# Patient Record
Sex: Male | Born: 1960 | Race: Black or African American | Hispanic: No | Marital: Single | Smoking: Never smoker
Health system: Southern US, Community
[De-identification: ages and names within clinical notes are randomized; demographics above are authoritative.]

## PROBLEM LIST (undated history)

## (undated) ENCOUNTER — Other Ambulatory Visit (HOSPITAL_COMMUNITY): Admission: EM | Payer: 59

## (undated) DIAGNOSIS — F419 Anxiety disorder, unspecified: Secondary | ICD-10-CM

## (undated) DIAGNOSIS — M170 Bilateral primary osteoarthritis of knee: Secondary | ICD-10-CM

## (undated) DIAGNOSIS — T8859XA Other complications of anesthesia, initial encounter: Secondary | ICD-10-CM

## (undated) DIAGNOSIS — Z8719 Personal history of other diseases of the digestive system: Secondary | ICD-10-CM

## (undated) DIAGNOSIS — G629 Polyneuropathy, unspecified: Secondary | ICD-10-CM

## (undated) DIAGNOSIS — G621 Alcoholic polyneuropathy: Secondary | ICD-10-CM

## (undated) DIAGNOSIS — J189 Pneumonia, unspecified organism: Secondary | ICD-10-CM

## (undated) DIAGNOSIS — K7010 Alcoholic hepatitis without ascites: Secondary | ICD-10-CM

## (undated) DIAGNOSIS — L219 Seborrheic dermatitis, unspecified: Secondary | ICD-10-CM

## (undated) DIAGNOSIS — K219 Gastro-esophageal reflux disease without esophagitis: Secondary | ICD-10-CM

## (undated) DIAGNOSIS — I10 Essential (primary) hypertension: Secondary | ICD-10-CM

## (undated) DIAGNOSIS — D860 Sarcoidosis of lung: Secondary | ICD-10-CM

## (undated) DIAGNOSIS — M24419 Recurrent dislocation, unspecified shoulder: Secondary | ICD-10-CM

## (undated) DIAGNOSIS — Z8673 Personal history of transient ischemic attack (TIA), and cerebral infarction without residual deficits: Secondary | ICD-10-CM

## (undated) DIAGNOSIS — G5601 Carpal tunnel syndrome, right upper limb: Secondary | ICD-10-CM

## (undated) DIAGNOSIS — I639 Cerebral infarction, unspecified: Secondary | ICD-10-CM

## (undated) DIAGNOSIS — S83249A Other tear of medial meniscus, current injury, unspecified knee, initial encounter: Secondary | ICD-10-CM

## (undated) DIAGNOSIS — E785 Hyperlipidemia, unspecified: Secondary | ICD-10-CM

## (undated) DIAGNOSIS — K760 Fatty (change of) liver, not elsewhere classified: Secondary | ICD-10-CM

## (undated) DIAGNOSIS — M4802 Spinal stenosis, cervical region: Secondary | ICD-10-CM

## (undated) DIAGNOSIS — Z789 Other specified health status: Secondary | ICD-10-CM

## (undated) DIAGNOSIS — F329 Major depressive disorder, single episode, unspecified: Secondary | ICD-10-CM

## (undated) DIAGNOSIS — K828 Other specified diseases of gallbladder: Secondary | ICD-10-CM

## (undated) DIAGNOSIS — F32A Depression, unspecified: Secondary | ICD-10-CM

## (undated) DIAGNOSIS — IMO0001 Reserved for inherently not codable concepts without codable children: Secondary | ICD-10-CM

## (undated) DIAGNOSIS — K859 Acute pancreatitis without necrosis or infection, unspecified: Secondary | ICD-10-CM

## (undated) DIAGNOSIS — F1010 Alcohol abuse, uncomplicated: Secondary | ICD-10-CM

## (undated) HISTORY — DX: Spinal stenosis, cervical region: M48.02

## (undated) HISTORY — PX: KNEE SURGERY: SHX244

## (undated) HISTORY — PX: KNEE ARTHROSCOPY: SUR90

## (undated) HISTORY — DX: Alcoholic polyneuropathy: G62.1

## (undated) HISTORY — DX: Essential (primary) hypertension: I10

## (undated) HISTORY — DX: Fatty (change of) liver, not elsewhere classified: K76.0

## (undated) HISTORY — DX: Depression, unspecified: F32.A

## (undated) HISTORY — DX: Hyperlipidemia, unspecified: E78.5

## (undated) HISTORY — DX: Personal history of other diseases of the digestive system: Z87.19

## (undated) HISTORY — DX: Pneumonia, unspecified organism: J18.9

## (undated) HISTORY — DX: Anxiety disorder, unspecified: F41.9

## (undated) HISTORY — PX: ELBOW SURGERY: SHX618

## (undated) HISTORY — DX: Other tear of medial meniscus, current injury, unspecified knee, initial encounter: S83.249A

## (undated) HISTORY — DX: Personal history of transient ischemic attack (TIA), and cerebral infarction without residual deficits: Z86.73

## (undated) HISTORY — DX: Other specified diseases of gallbladder: K82.8

## (undated) HISTORY — DX: Recurrent dislocation, unspecified shoulder: M24.419

## (undated) HISTORY — DX: Other specified health status: Z78.9

## (undated) HISTORY — DX: Alcoholic hepatitis without ascites: K70.10

## (undated) HISTORY — DX: Bilateral primary osteoarthritis of knee: M17.0

## (undated) HISTORY — DX: Seborrheic dermatitis, unspecified: L21.9

---

## 2006-07-09 ENCOUNTER — Emergency Department (HOSPITAL_COMMUNITY): Admission: EM | Admit: 2006-07-09 | Discharge: 2006-07-09 | Payer: Self-pay | Admitting: Emergency Medicine

## 2006-07-09 ENCOUNTER — Ambulatory Visit: Payer: Self-pay | Admitting: Psychiatry

## 2006-07-09 ENCOUNTER — Inpatient Hospital Stay (HOSPITAL_COMMUNITY): Admission: RE | Admit: 2006-07-09 | Discharge: 2006-07-14 | Payer: Self-pay | Admitting: Psychiatry

## 2006-07-11 ENCOUNTER — Ambulatory Visit: Payer: Self-pay | Admitting: Hospitalist

## 2007-02-04 ENCOUNTER — Ambulatory Visit: Payer: Self-pay | Admitting: Internal Medicine

## 2007-02-28 ENCOUNTER — Ambulatory Visit: Payer: Self-pay | Admitting: Internal Medicine

## 2008-02-01 ENCOUNTER — Emergency Department (HOSPITAL_COMMUNITY): Admission: EM | Admit: 2008-02-01 | Discharge: 2008-02-02 | Payer: Self-pay | Admitting: Emergency Medicine

## 2008-02-02 ENCOUNTER — Ambulatory Visit: Payer: Self-pay | Admitting: Psychiatry

## 2008-02-02 ENCOUNTER — Inpatient Hospital Stay (HOSPITAL_COMMUNITY): Admission: AD | Admit: 2008-02-02 | Discharge: 2008-02-05 | Payer: Self-pay | Admitting: Psychiatry

## 2008-02-19 ENCOUNTER — Emergency Department (HOSPITAL_COMMUNITY): Admission: EM | Admit: 2008-02-19 | Discharge: 2008-02-20 | Payer: Self-pay | Admitting: Emergency Medicine

## 2008-02-24 DIAGNOSIS — M129 Arthropathy, unspecified: Secondary | ICD-10-CM | POA: Insufficient documentation

## 2008-05-30 ENCOUNTER — Emergency Department (HOSPITAL_COMMUNITY): Admission: EM | Admit: 2008-05-30 | Discharge: 2008-05-30 | Payer: Self-pay | Admitting: Emergency Medicine

## 2011-04-11 NOTE — H&P (Signed)
NAMEMCKINNON, GLICK NO.:  [id number]   MEDICAL RECORD NO.:  [id number]          PATIENT TYPE:  IPS   LOCATION:  0507                          FACILITY:  [facility]   PHYSICIAN:  Geoffery Lyons, M.D.      DATE OF BIRTH:  [birth year]   DATE OF ADMISSION:  02/02/2008  DATE OF DISCHARGE:                       PSYCHIATRIC ADMISSION ASSESSMENT   IDENTIFYING INFORMATION/JUSTIFICATION FOR ADMISSION AND CARE:  This is a  50 year old separated African-American man who presented to the  emergency department at [facility], requesting DETOX from beer.  He states he usually drinks a 12 pack of 22 ounce beers on a daily  basis. His initial alcohol level was 308. It did come down to 193. His  UDS was negative. He has been with [place] in the past. The last admission  was in 2006, also for a DETOX.   He reports anxiety since age 75. He was started on Paxil about 3 years  ago. This made him develop suicidal ideation. He is very reluctant to  take medications for his anxiety. Today, he has elevated blood pressure  and I thought that maybe what we would do is initiate a Clonidine patch  to help with his blood pressure and his anxiety.   PAST PSYCHIATRIC HISTORY:  As already stated. He was with [place] back in  2006 and that is the only DETOX he acknowledges.   SOCIAL HISTORY:  He has had 1 year of college. He ha been married once.  He is currently separated. He has 2 sons, ages 45 and 55 and he is  employed by [employer].   FAMILY HISTORY:  Alcoholism runs in the family. His own alcohol and drug  history, he began using alcohol at age 37. He readily acknowledges  drinking at least a 6 pack a day for the past year. He states that  alcoholism has been an issue for him for about 15 years.   PRIMARY CARE PHYSICIAN:  He does not have a primary care physician yet  but he is anticipating getting health insurance. He does not have any  psychiatric provider.   PAST MEDICAL HISTORY:  He  was diagnosed with sarcoidosis a number of  years ago. He is not on any current medication.   MEDICATIONS:  As already stated in HPI. He reports he developed SI when  she was prescribed Paxil a few years ago.   ALLERGIES:  NO KNOWN DRUG ALLERGIES.   REVIEW OF SYSTEMS:  He was medically cleared in the emergency  department. His most remarkable finding was an enthusiastic alcohol  level of 308.   PHYSICAL EXAMINATION:  VITAL SIGNS:  He is 69 inches tall. Weight 141.  Temperature 97.4, blood pressure ranges from 130/89 to 145/86. Pulse 81  to 91 and respiratory rate is 20.   LABORATORY DATA:  He has not had a TSH checked. He does report a 7 pound  weight loss in the past year and he stays anxious, so we will just check  that out.   MENTAL STATUS EXAM:  He is alert and oriented. He is  casually dressed.  Appropriately groomed and nourished. Speech is normal rate, rhythm, and  tone. Mood is appropriate to the situation. Affect is a little anxious.  Thought processes are clear, rational, goal oriented. Wants to know if  anything can be done for his anxiety. Concentration and memory are  intact. Intelligence it at least average. He denies being suicidal or  homicidal. He denies auditory or visual hallucinations.   DIAGNOSES:  AXIS I:     Alcohol dependence, here for DETOX.  AXIS II:    Deferred.  AXIS III:   Sarcoidosis, hypertension.  AXIS IV:    Alcohol dependence.  AXIS V:     45.   PLAN:  Admit for DETOX. Toward that end, he was started on low-dose  Librium protocol. As he is afraid of medications, will try a Clonidine  patch to decrease his blood pressure and anxiety. He does have a slight  hand tremor. Will also check his TSH adn estimated length of stay is 4  to 5 days.      Mickie Leonarda Salon, P.A.-C.      Geoffery Lyons, M.D.  Electronically Signed    MD/MEDQ  D:  02/02/2008  T:  02/02/2008  Job:  [id number]

## 2011-04-14 NOTE — Discharge Summary (Signed)
[name], STRUPP NO.:  [id number]   MEDICAL RECORD NO.:  [id number]          PATIENT TYPE:  IPS   LOCATION:  0507                          FACILITY:  BH   PHYSICIAN:  Geoffery Lyons, M.D.      DATE OF BIRTH:  04/14/1961   DATE OF ADMISSION:  02/02/2008  DATE OF DISCHARGE:  02/05/2008                               DISCHARGE SUMMARY   CHIEF COMPLAINT/PRESENT ILLNESS:  This was the second admission to [name] for this 50 year old separated African American  male who presented to the emergency room requesting detox.  Usually  drinks 12-pack of 22 ounces beer on a daily basis.  Initial alcohol  level was 308.  UDS was negative.  Last admission 2006 reports anxiety  since age 89.  He was started on Paxil 3 years ago.  He claims suicidal  ideations after that.  Somewhat reluctant to take medications.   PAST PSYCHIATRIC HISTORY:  [name] 2006 for detox.   ALCOHOL/TOBACCO HISTORY:  Persistent use of alcohol, as of lately 12-  pack of 22 ounces beer on a daily basis.  No other substances.   MEDICAL HISTORY:  Sarcoidosis.   MEDICATIONS:  None.   PHYSICAL EXAMINATION:  Showed no acute findings.   LABORATORY WORK:  Alcohol level upon admission 308.   MENTAL STATUS EXAM:  Reveals alert, cooperative male, casually groomed  and dressed.  Normal in rate, rhythm and tone.  Mood was anxious.  Affect was anxious.  Thought processes were clear, rational and goal  oriented.  No delusions.  No active homicidal or suicidal ideas, no  hallucinations.  Cognition well-preserved.   AXIS I:  Alcohol dependence, anxiety disorder not otherwise specified.  AXIS II:  Sarcoidosis, hypertension.  AXIS IV: Moderate.  AXIS V:  On admission 35 GAF, in the last year 60.   COURSE IN THE HOSPITAL:  Was admitted, started individual and group  psychotherapy.  We detoxified with Librium, then he was given some  Elavil to begin with for sleep, placed on a  clonidine patch and started  on Lexapro.  As already stated, endorsed he was drinking too much, 6-8  every day for the last 6-7 months.  Family history of alcoholism.  Endorsed he suffers from chronic anxiety.  When younger, he had panic  attacks, social anxiety, used the beer to deal with the anxiety but  increased tolerance.  In [place], primary care prescribed Paxil and  endorsed suicidal ideations.  Endorsed constant worrying, thinking.  Panic attacks are better but still with generalized anxiety, decreased  sleep, separated from his wife.  She is in [place].  He lost his job,  financial difficulties, came here one and a half years ago temporarily.  Has two kids, 16 and 14.  By March 10, the detox was going well.  He was  going to like to pursue the Lexapro further.  Was wanting to be  considered for discharge.  He was committed to abstinence.  On March 11,  he was in full contact with reality.  There were  no active homicidal or  suicidal ideas, no hallucinations or delusions.  His mood was improve.  His affect was brighter.  Willing and motivated to pursue outpatient  treatment.   DISCHARGE DIAGNOSES:  AXIS I:  Alcohol dependence, anxiety disorder not  otherwise specified.  AXIS II: No diagnosis.  AXIS III:  Sarcoidosis, active hypertension.  AXIS IV: Moderate.  AXIS V:  60.   DISCHARGE MEDICATIONS:  Discharged on Lexapro 10 mg per day.  1. Catapres TTS 0.1 mg one patch every 7 days elevated to 25 at      bedtime.   FOLLOWUP:  Follow-up at [facility].      Geoffery Lyons, M.D.  Electronically Signed     IL/MEDQ  D:  03/04/2008  T:  03/05/2008  Job:  [id number]

## 2011-04-14 NOTE — Assessment & Plan Note (Signed)
[facility]                             PULMONARY OFFICE NOTE   [name], [name]                          MRN:          [id number]  DATE:02/28/2007                            DOB:          28-Dec-1960    HISTORY:  A 50 year old, black male seen on February 04, 2007 for  evaluation of an eight week history of cough that had not responded to  treatment directed at rhinitis in the setting of a ten year history of  sarcoid which had not required prednisone for several years but for  which he thought he might be recurrent.  He was requested to come back  today for followup and has taken a short course of prednisone that he is  finishing up but also been maintained on Prevacid and diet since that  time.  He states overall he is much better, with no significant coughing  or dyspnea.  He denies any ocular or articular changes, fever, chills,  sweats.   PHYSICAL EXAMINATION:  GENERAL:  He is a pleasant, ambulatory, black man  in no acute distress.  VITAL SIGNS:  He weighs 148 pounds.  HEENT:  Unremarkable.  Pharynx clear.  LUNGS:  Fields are clear bilaterally to auscultation and percussion with  no cough elicited on inspiratory or expiratory maneuvers nor any  wheezing.  HEART:  He has a regular rhythm without murmur, gallop, rub.  ABDOMEN:  Soft, benign.  EXTREMITIES:  Warm without calf tenderness, cyanosis, clubbing, or  edema.   He brought a recent chest x-ray with him that shows classic sarcoid  changes.  PFTs today are completely normal.   IMPRESSION:  No evidence of significant restrictive physiology or  definite indication for sarcoid.  The only indication, therefore, would  be a steroid responsive cough.  Since we treated him with both  Prevacid and prednisone, it is difficult to tell which one has helped  but he is definitely better.  At this point, he is finishing up the  predinsone today and I have asked him to continue the Prevacid for  another month.  If the cough exacerbates while on Prevacid but not on  prednisone, it would indicate an inflammatory but not necessarily  sarcoid etiology for the cough.  The differential diagnosis would  include allergic and nonspecific rhinitis, and asthma as well.  He is a  50 year old, black male, never smoker.   I explained this all to the patient including the results of his recent  chest x-ray and PFTs in detail.  I spent an extra 15 to 20 minutes in a  25 minute visit going over this plus the recommendation regarding  empiric diet restrictions for GERD (including the avoidance of mint and  menthol lozenges) and gave him samples that will last a month.  I will  see him at six weeks,  at which point he will be off the Prevacid for two weeks to see, by the  reverse of a therapeutic trial, at which point if either he flares in  terms of his symptoms.     [name].  Sherene Sires, MD, [facility]  Electronically Signed    MBW/MedQ  DD: 02/28/2007  DT: 02/28/2007  Job #: [id number]   cc:   Vikki Ports, M.D.

## 2011-04-14 NOTE — Assessment & Plan Note (Signed)
[facility]                             PULMONARY OFFICE NOTE   BUEFORD, ARP                          MRN:          [id number]  DATE:02/04/2007                            DOB:          1961-03-22    This is a pulmonary consultation requested by Dr. Theresia Lo.   REASON FOR CONSULTATION:  Cough.   HISTORY:  A 50 year old black male with history of sarcoid documented 10  years ago by lung biopsy and required prednisone for cough for several  years but has not been on it for over five years. He presents now with  about an eight-week history of dry cough (previous cough had been  slightly productive) that had not responded to treatment directed at  rhinitis. He has not been rechallenged with prednisone at this point. He  denies any ocular or articular complaints, skin complaints, significant  dyspnea, fevers, chills or unattended weight loss.   He denies any change in cough with variations in weather or environment  or any obvious alleviating factors.   PAST MEDICAL HISTORY:  Significant for hypertension and hyperlipidemia.   ALLERGIES:  None known.   MEDICATIONS:  Statin only, does not know the name.   SOCIAL HISTORY:  He never smoked. Occasional drinks but not in excess.  Works at [employer].   FAMILY HISTORY:  Negative for sarcoid or respiratory diseases.   REVIEW OF SYSTEMS:  Taken in detail on the worksheet. Negative except  for as outlined above.   PHYSICAL EXAMINATION:  This is a pleasant ambulatory black male in no  acute distress. He has stable vital signs.  HEENT:  Unremarkable. Oropharynx clear. Nasal turbinates normal.  NECK:  Supple without cervical adenopathy or tenderness. Trachea is  midline. No thyromegaly.  LUNGS:  Fields are completely clear bilaterally to auscultation and  percussion with no cough elicited on inspiratory or expiratory  maneuvers.  CARDIAC:  Regular rhythm without murmur, gallop or rub.  ABDOMEN:  Soft,  benign.  EXTREMITIES:  Warm without calf tenderness, clubbing, cyanosis, or  edema.   Hemo-saturation 97% on room air.   Chest x-ray from February 27 is typical of sarcoid.   Lab data from Dr. Aurelio Brash office reveals a normal protein to albumin  ratio, normal LFTs, thyroid functions.   IMPRESSION:  1. Long-standing sarcoid with previous cough that responded to      prednisone, but this was over five years ago. I suspect what we are      seeing now on chest x-ray is nothing more than parenchymal fibrotic      change related to previous sarcoid involvement, and he will not      likely have elevated sed rate or ACE level, and if not obtained by      Dr. Theresia Lo, we can do this on his next visit along with PFTs.  2. Much of coughing now is nonspecific in nature, more day than night      and dry in quality. I recommended a trial of Prevacid dosed 30 mg      before breakfast every day, a  6-day course of prednisone starting      at 40 mg and tapering off to see whether there is any short-term      benefit of prednisone or whether there is a long-term benefit of      Prevacid and advised him on diet only (the other possibility is      that he might have an asthmatic component to the cough, but note      the absence of nocturnal exacerbation makes this less likely).     Charlaine Dalton. Sherene Sires, MD, [facility]  Electronically Signed    MBW/MedQ  DD: 02/04/2007  DT: 02/05/2007  Job #: [id number]   cc:   Vikki Ports, M.D.

## 2011-04-14 NOTE — Discharge Summary (Signed)
NAMEBODEN, STUCKY NO.:  [id number]   MEDICAL RECORD NO.:  [id number]          PATIENT TYPE:  IPS   LOCATION:  0305                          FACILITY:  BH   PHYSICIAN:  Geoffery Lyons, M.D.      DATE OF BIRTH:  03-09-1961   DATE OF ADMISSION:  07/09/2006  DATE OF DISCHARGE:  07/14/2006                                 DISCHARGE SUMMARY   CHIEF COMPLAINT AND PRESENT ILLNESS:  This was the first admission to [facility] for this 50 year old separated African-American male  who presented to the emergency room requesting detox from alcohol, drinking  for a number of years, now escalating to 12+ beers daily.  Unable to quit  due to shaking.  Had some paresthesias in the ED attributed to withdrawal.  Endorsed lifelong history of anxiety with panic episodes, social anxieties.   PAST PSYCHIATRIC HISTORY:  First time at [facility].  Had been in  outpatient treatment in [place].   ALCOHOL/DRUG HISTORY:  Persistent use of alcohol as already stated.  No  other substances.   MEDICAL HISTORY:  Prostatitis, pulmonary sarcoidosis.   MEDICATIONS:  Prednisone 20 mg per day, Levaquin 500 mg daily.   PHYSICAL EXAMINATION:  Performed and failed to show any acute findings.   LABORATORY DATA:  Not available in the chart.   MENTAL STATUS EXAM:  Alert, anxious male.  Tremulous.  CIWA 12-15.  Speech  normal rate, tempo and production but tremulous tone.  Mood anxiety.  Affect  anxiety.  Thought processes logical, coherent and relevant.  Endorsed  feeling shaky.  Wanting to be detoxed.  Committed to abstinence.  No  delusions.  No hallucinations.  Cognition was well-preserved.   ADMISSION DIAGNOSES:  AXIS I:  Alcohol dependence.  Alcohol withdrawal.  Panic attack disorder.  AXIS II:  No diagnosis.  AXIS III:  Alcohol-induced hepatitis, thrombocytopenia, prostatitis,  pulmonary sarcoidosis.  AXIS IV:  Moderate.  AXIS V:  GAF upon admission 35; highest GAF  in the last year 66.   HOSPITAL COURSE:  He was admitted.  He was started in individual and group  psychotherapy.  He was detoxified with Librium.  He was maintained on his  prednisone and Levaquin.  He did endorse persistent drinking, more than 12  beers per day for years.  Endorsed long history of anxiety, panic since he  was very young.  He had been assessed for mixed symptoms of paresthesias,  weakness.  He claimed he was given Paxil once and he claimed that it made  him suicidal.  Endorsed that he is very worried about taking pills.  On  evaluation, he was very somatically focused.  He separated from his wife.  He has two children.  Came to [place], back to his parents' home.  Wife  stayed in [place].  Endorsed that they will probably get a divorce.  He was  maintained on medicine.  Initially, he was requesting to be discharged.  The  severity of his withdrawal was explained and he decided to stay.  We  continued to detox.  By July 06, 2006, endorsed that he was starting to  feel a little better.  Was able to sleep, still feeling groggy, lightheaded  but less anxious.  The detox continued to progress uneventfully.  He had a  bout with increased blood pressure but it was mostly anxiety-induced.  On  July 14, 2006, he was in full contact with reality.  There were no active  suicidal or homicidal ideation.  No hallucinations.  No delusions.  Willing  and motivated to pursue sobriety.  He was going to go to [facility].  He was also going to be following up with his primary care provider.   DISCHARGE DIAGNOSES:  AXIS I:  Alcohol dependence.  Alcohol withdrawal,  resolved.  Anxiety disorder not otherwise specified.  AXIS II:  No diagnosis.  AXIS III:  Thrombocytopenia and alcohol-induced hepatitis, resolving,  pulmonary sarcoidosis.  AXIS IV:  Moderate.  AXIS V:  GAF upon discharge 50.   DISCHARGE MEDICATIONS:  1. Levaquin 500 mg per day.  2. Prednisone 20 mg per  day.   FOLLOWUP:  [facility] and his primary care provider.      Geoffery Lyons, M.D.  Electronically Signed     IL/MEDQ  D:  07/31/2006  T:  07/31/2006  Job:  [id number]

## 2011-08-21 LAB — COMPREHENSIVE METABOLIC PANEL
ALT: 85 — ABNORMAL HIGH
AST: 93 — ABNORMAL HIGH
Albumin: 4.2
Alkaline Phosphatase: 38 — ABNORMAL LOW
BUN: 5 — ABNORMAL LOW
CO2: 28
Calcium: 9.1
Chloride: 101
Creatinine, Ser: 1.04
GFR calc Af Amer: >60
GFR calc non Af Amer: >60
Glucose, Bld: 95
Potassium: 3.8
Sodium: 139
Total Bilirubin: 0.8
Total Protein: 8.2

## 2011-08-21 LAB — CBC
HCT: 40.9
HCT: 42.2
Hemoglobin: 13.8
Hemoglobin: 14.2
MCHC: 33.7
MCHC: 33.7
MCV: 81.8
MCV: 81.6
Platelets: 227
Platelets: 265
RBC: 5
RBC: 5.17
RDW: 14.9
RDW: 14.7
WBC: 4.5
WBC: 5.5

## 2011-08-21 LAB — DIFFERENTIAL
Basophils Absolute: 0
Basophils Absolute: 0
Basophils Relative: 1
Basophils Relative: 1
Eosinophils Absolute: 0
Eosinophils Absolute: 0.1
Eosinophils Relative: 1
Eosinophils Relative: 2
Lymphocytes Relative: 21
Lymphocytes Relative: 32
Lymphs Abs: 1
Lymphs Abs: 1.8
Monocytes Absolute: 0.6
Monocytes Absolute: 0.7
Monocytes Relative: 13 — ABNORMAL HIGH
Monocytes Relative: 13 — ABNORMAL HIGH
Neutro Abs: 2.9
Neutro Abs: 2.9
Neutrophils Relative %: 64
Neutrophils Relative %: 52

## 2011-08-21 LAB — RAPID URINE DRUG SCREEN, HOSP PERFORMED
Amphetamines: NOT DETECTED
Amphetamines: NOT DETECTED
Barbiturates: NOT DETECTED
Barbiturates: NOT DETECTED
Benzodiazepines: NOT DETECTED
Benzodiazepines: POSITIVE — AB
Cocaine: NOT DETECTED
Cocaine: NOT DETECTED
Opiates: NOT DETECTED
Opiates: NOT DETECTED
Tetrahydrocannabinol: NOT DETECTED
Tetrahydrocannabinol: NOT DETECTED

## 2011-08-21 LAB — URINALYSIS, ROUTINE W REFLEX MICROSCOPIC
Bilirubin Urine: NEGATIVE
Glucose, UA: NEGATIVE
Hgb urine dipstick: NEGATIVE
Ketones, ur: NEGATIVE
Nitrite: NEGATIVE
Protein, ur: NEGATIVE
Specific Gravity, Urine: 1.005
Urobilinogen, UA: 0.2
pH: 6

## 2011-08-21 LAB — ETHANOL
Alcohol, Ethyl (B): 193 — ABNORMAL HIGH
Alcohol, Ethyl (B): 308 — ABNORMAL HIGH
Alcohol, Ethyl (B): 285 — ABNORMAL HIGH

## 2011-08-21 LAB — BASIC METABOLIC PANEL
BUN: 2 — ABNORMAL LOW
CO2: 29
Calcium: 9.1
Chloride: 103
Creatinine, Ser: 1.15
GFR calc Af Amer: >60
GFR calc non Af Amer: >60
Glucose, Bld: 107 — ABNORMAL HIGH
Potassium: 4.1
Sodium: 140

## 2011-08-21 LAB — TSH: TSH: 2.055

## 2011-08-24 LAB — DIFFERENTIAL
Basophils Absolute: 0
Basophils Relative: 0
Eosinophils Absolute: 0.1
Eosinophils Relative: 1
Lymphocytes Relative: 20
Lymphs Abs: 1.2
Monocytes Absolute: 0.7
Monocytes Relative: 12
Neutro Abs: 3.9
Neutrophils Relative %: 67

## 2011-08-24 LAB — BASIC METABOLIC PANEL
BUN: 9
CO2: 27
Calcium: 9.4
Chloride: 96
Creatinine, Ser: 0.94
GFR calc Af Amer: >60
GFR calc non Af Amer: >60
Glucose, Bld: 101 — ABNORMAL HIGH
Potassium: 3.9
Sodium: 134 — ABNORMAL LOW

## 2011-08-24 LAB — URINALYSIS, ROUTINE W REFLEX MICROSCOPIC
Bilirubin Urine: NEGATIVE
Glucose, UA: NEGATIVE
Hgb urine dipstick: NEGATIVE
Ketones, ur: NEGATIVE
Nitrite: NEGATIVE
Protein, ur: NEGATIVE
Specific Gravity, Urine: 1.01
Urobilinogen, UA: 1
pH: 6

## 2011-08-24 LAB — CBC
HCT: 43
Hemoglobin: 14.6
MCHC: 34
MCV: 79.4
Platelets: 187
RBC: 5.41
RDW: 15.3
WBC: 5.8

## 2012-07-01 ENCOUNTER — Encounter (HOSPITAL_BASED_OUTPATIENT_CLINIC_OR_DEPARTMENT_OTHER): Payer: Self-pay | Admitting: *Deleted

## 2012-07-01 ENCOUNTER — Emergency Department (HOSPITAL_BASED_OUTPATIENT_CLINIC_OR_DEPARTMENT_OTHER)
Admission: EM | Admit: 2012-07-01 | Discharge: 2012-07-01 | Disposition: A | Discharge status: Home / Self Care | Payer: Self-pay | Attending: Emergency Medicine | Admitting: Emergency Medicine

## 2012-07-01 DIAGNOSIS — F1020 Alcohol dependence, uncomplicated: Secondary | ICD-10-CM | POA: Insufficient documentation

## 2012-07-01 DIAGNOSIS — F341 Dysthymic disorder: Secondary | ICD-10-CM | POA: Insufficient documentation

## 2012-07-01 DIAGNOSIS — K219 Gastro-esophageal reflux disease without esophagitis: Secondary | ICD-10-CM | POA: Insufficient documentation

## 2012-07-01 HISTORY — DX: Gastro-esophageal reflux disease without esophagitis: K21.9

## 2012-07-01 HISTORY — DX: Anxiety disorder, unspecified: F41.9

## 2012-07-01 HISTORY — DX: Alcohol abuse, uncomplicated: F10.10

## 2012-07-01 HISTORY — DX: Sarcoidosis of lung: D86.0

## 2012-07-01 HISTORY — DX: Major depressive disorder, single episode, unspecified: F32.9

## 2012-07-01 HISTORY — DX: Reserved for inherently not codable concepts without codable children: IMO0001

## 2012-07-01 HISTORY — DX: Depression, unspecified: F32.A

## 2012-07-01 LAB — CBC WITH DIFFERENTIAL/PLATELET
Basophils Absolute: 0 10*3/uL (ref 0.0–0.1)
Basophils Relative: 0 % (ref 0–1)
Eosinophils Absolute: 0 10*3/uL (ref 0.0–0.7)
Eosinophils Relative: 1 % (ref 0–5)
HCT: 38.4 % — ABNORMAL LOW (ref 39.0–52.0)
Hemoglobin: 13 g/dL (ref 13.0–17.0)
Lymphocytes Relative: 30 % (ref 12–46)
Lymphs Abs: 1.4 10*3/uL (ref 0.7–4.0)
MCH: 27.8 pg (ref 26.0–34.0)
MCHC: 33.9 g/dL (ref 30.0–36.0)
MCV: 82.2 fL (ref 78.0–100.0)
Monocytes Absolute: 0.6 10*3/uL (ref 0.1–1.0)
Monocytes Relative: 12 % (ref 3–12)
Neutro Abs: 2.5 10*3/uL (ref 1.7–7.7)
Neutrophils Relative %: 56 % (ref 43–77)
Platelets: 166 10*3/uL (ref 150–400)
RBC: 4.67 MIL/uL (ref 4.22–5.81)
RDW: 16.4 % — ABNORMAL HIGH (ref 11.5–15.5)
WBC: 4.5 10*3/uL (ref 4.0–10.5)

## 2012-07-01 LAB — RAPID URINE DRUG SCREEN, HOSP PERFORMED
Amphetamines: NOT DETECTED
Barbiturates: NOT DETECTED
Benzodiazepines: NOT DETECTED
Cocaine: NOT DETECTED
Opiates: NOT DETECTED
Tetrahydrocannabinol: NOT DETECTED

## 2012-07-01 LAB — HEPATIC FUNCTION PANEL
ALT: 61 U/L — ABNORMAL HIGH (ref 0–53)
AST: 75 U/L — ABNORMAL HIGH (ref 0–37)
Albumin: 4.3 g/dL (ref 3.5–5.2)
Alkaline Phosphatase: 36 U/L — ABNORMAL LOW (ref 39–117)
Bilirubin, Direct: <0.1 mg/dL (ref 0.0–0.3)
Total Bilirubin: 0.2 mg/dL — ABNORMAL LOW (ref 0.3–1.2)
Total Protein: 8.9 g/dL — ABNORMAL HIGH (ref 6.0–8.3)

## 2012-07-01 LAB — POCT I-STAT, CHEM 8
BUN: 8 mg/dL (ref 6–23)
Calcium, Ion: 1.04 mmol/L — ABNORMAL LOW (ref 1.12–1.23)
Chloride: 101 mEq/L (ref 96–112)
Creatinine, Ser: 1.4 mg/dL — ABNORMAL HIGH (ref 0.50–1.35)
Glucose, Bld: 88 mg/dL (ref 70–99)
HCT: 44 % (ref 39.0–52.0)
Hemoglobin: 15 g/dL (ref 13.0–17.0)
Potassium: 3.9 mEq/L (ref 3.5–5.1)
Sodium: 139 mEq/L (ref 135–145)
TCO2: 26 mmol/L (ref 0–100)

## 2012-07-01 LAB — ETHANOL
Alcohol, Ethyl (B): 300 mg/dL — ABNORMAL HIGH (ref 0–11)
Alcohol, Ethyl (B): 228 mg/dL — ABNORMAL HIGH (ref 0–11)

## 2012-07-01 LAB — LIPASE, BLOOD: Lipase: 198 U/L — ABNORMAL HIGH (ref 11–59)

## 2012-07-01 MED ORDER — SODIUM CHLORIDE 0.9 % IV BOLUS (SEPSIS)
1000.0000 mL | Freq: Once | INTRAVENOUS | Status: AC
  Administered 2012-07-01: 1000 mL via INTRAVENOUS

## 2012-07-01 MED ORDER — LORAZEPAM 2 MG/ML IJ SOLN
INTRAMUSCULAR | Status: AC
  Administered 2012-07-01: 1 mg via INTRAVENOUS
  Filled 2012-07-01: qty 1

## 2012-07-01 MED ORDER — THIAMINE HCL 100 MG/ML IJ SOLN
Freq: Once | INTRAVENOUS | Status: DC
  Filled 2012-07-01: qty 1000

## 2012-07-01 MED ORDER — LORAZEPAM 1 MG PO TABS: 0.5000 mg | ORAL_TABLET | Freq: Three times a day (TID) | ORAL | Status: AC | PRN

## 2012-07-01 MED ORDER — LORAZEPAM 2 MG/ML IJ SOLN
1.0000 mg | Freq: Once | INTRAMUSCULAR | Status: AC
  Administered 2012-07-01: 1 mg via INTRAVENOUS

## 2012-07-01 NOTE — ED Notes (Signed)
Patient stated that he was sent here by [facility], attempted to contact [facility] in [facility], but no answer. Contacted [facility] and Mardelle Matte will call us shortly after 13:00 to discuss patient placement

## 2012-07-01 NOTE — ED Notes (Signed)
Mardelle Matte called from [facility] and stated she would be here at approx 14:00 to evaluate patient

## 2012-07-01 NOTE — ED Notes (Signed)
Mardelle Matte from [facility] explained to patient and family that he could not be admitted for rehab because his blood alcohol was greater than 200 and he would have to be discharged, could not drink tonight and come in tomorrow for repeat labs and if his alcohol level is lower their team would request a room for him. Family and patient agreed on plan. Patient agreed not to drink tonight

## 2012-07-01 NOTE — ED Notes (Signed)
Patient states he is here today for help with his alcohol abuse.  States he went to [facility] and was told to come here for medical clearance.  States he has had a long standing alcohol problem and over the last one year his drinking problem has increased.  States he drinks approximately 5- 24 oz cans of beer per day, last drink was approximately at 0800 today.  Denies any other substance abuse problems.

## 2012-07-01 NOTE — ED Provider Notes (Signed)
History     CSN: [id number]  Arrival date & time 07/01/12  1115   First MD Initiated Contact with Patient 07/01/12 1158      Chief Complaint  Patient presents with  . Medical Clearance    (Consider location/radiation/quality/duration/timing/severity/associated sxs/prior treatment) Patient is a 51 y.o. male presenting with alcohol problem. The history is provided by the patient.  Alcohol Problem This is a chronic problem. The problem has been unchanged. Associated symptoms include myalgias and numbness. Pertinent negatives include no chills or fever. Associated symptoms comments: The patient presents for help with detox from alcohol. He states he was seen at [facility] and sent here for medical clearance. (Cannot confirm with [facility] - no answer). He reports daily alcohol use since months ago when he last attempted to stop drinking. No history of alcohol withdrawal seizures. He denies current symptoms of nausea/vomiting, or shakes.He complains of generalized muscle aches and numbness in bilateral lower extremities and finger tips. Numbness has been chronic. No swelling..    Past Medical History  Diagnosis Date  . Alcohol abuse   . Sarcoidosis of lung   . Reflux   . Depression   . Anxiety     Past Surgical History  Procedure Date  . Knee arthroscopy     No family history on file.  History  Substance Use Topics  . Smoking status: Never Smoker   . Smokeless tobacco: Not on file  . Alcohol Use: Yes     4-5  24 oz cans of beer daily      Review of Systems  Constitutional: Negative for fever and chills.  HENT: Negative.   Respiratory: Negative.   Cardiovascular: Negative.   Gastrointestinal: Negative.   Musculoskeletal: Positive for myalgias.  Skin: Negative.   Neurological: Positive for numbness.  Psychiatric/Behavioral: Negative.     Allergies  Review of patient's allergies indicates no known allergies.  Home Medications  No current outpatient prescriptions on  file.  BP 138/88  Pulse 77  Temp 98.2 F (36.8 C) (Oral)  Resp 21  Ht 5\' 9"  (1.753 m)  Wt 130 lb (58.968 kg)  BMI 19.20 kg/m2  SpO2 98%  Physical Exam  Constitutional: He is oriented to person, place, and time. He appears well-developed and well-nourished.  HENT:  Head: Normocephalic.  Neck: Normal range of motion. Neck supple.  Cardiovascular: Normal rate and regular rhythm.   Pulmonary/Chest: Effort normal and breath sounds normal.  Abdominal: Soft. Bowel sounds are normal. There is no tenderness. There is no rebound and no guarding.  Musculoskeletal: Normal range of motion.  Neurological: He is alert and oriented to person, place, and time.       The patient is mildly tremulous. No asterixis. Slightly decreased sensation to light touch in bilateral lower extremities. No weakness. Normal reflexes.   Skin: Skin is warm and dry. No rash noted.  Psychiatric: He has a normal mood and affect.    ED Course  Procedures (including critical care time)  Labs Reviewed  CBC WITH DIFFERENTIAL - Abnormal; Notable for the following:    HCT 38.4 (*)     RDW 16.4 (*)     All other components within normal limits  POCT I-STAT, CHEM 8 - Abnormal; Notable for the following:    Creatinine, Ser 1.40 (*)     Calcium, Ion 1.04 (*)     All other components within normal limits  URINE RAPID DRUG SCREEN (HOSP PERFORMED)  LIPASE, BLOOD  HEPATIC FUNCTION PANEL   No  results found. Results for orders placed during the hospital encounter of 07/01/12  URINE RAPID DRUG SCREEN (HOSP PERFORMED)      Component Value Range   Opiates NONE DETECTED  NONE DETECTED   Cocaine NONE DETECTED  NONE DETECTED   Benzodiazepines NONE DETECTED  NONE DETECTED   Amphetamines NONE DETECTED  NONE DETECTED   Tetrahydrocannabinol NONE DETECTED  NONE DETECTED   Barbiturates NONE DETECTED  NONE DETECTED  CBC WITH DIFFERENTIAL      Component Value Range   WBC 4.5  4.0 - 10.5 K/uL   RBC 4.67  4.22 - 5.81 MIL/uL    Hemoglobin 13.0  13.0 - 17.0 g/dL   HCT 16.1 (*) 09.6 - 04.5 %   MCV 82.2  78.0 - 100.0 fL   MCH 27.8  26.0 - 34.0 pg   MCHC 33.9  30.0 - 36.0 g/dL   RDW 40.9 (*) 81.1 - 91.4 %   Platelets 166  150 - 400 K/uL   Neutrophils Relative 56  43 - 77 %   Neutro Abs 2.5  1.7 - 7.7 K/uL   Lymphocytes Relative 30  12 - 46 %   Lymphs Abs 1.4  0.7 - 4.0 K/uL   Monocytes Relative 12  3 - 12 %   Monocytes Absolute 0.6  0.1 - 1.0 K/uL   Eosinophils Relative 1  0 - 5 %   Eosinophils Absolute 0.0  0.0 - 0.7 K/uL   Basophils Relative 0  0 - 1 %   Basophils Absolute 0.0  0.0 - 0.1 K/uL  LIPASE, BLOOD      Component Value Range   Lipase 198 (*) 11 - 59 U/L  HEPATIC FUNCTION PANEL      Component Value Range   Total Protein 8.9 (*) 6.0 - 8.3 g/dL   Albumin 4.3  3.5 - 5.2 g/dL   AST 75 (*) 0 - 37 U/L   ALT 61 (*) 0 - 53 U/L   Alkaline Phosphatase 36 (*) 39 - 117 U/L   Total Bilirubin 0.2 (*) 0.3 - 1.2 mg/dL   Bilirubin, Direct <7.8  0.0 - 0.3 mg/dL   Indirect Bilirubin NOT CALCULATED  0.3 - 0.9 mg/dL  POCT I-STAT, CHEM 8      Component Value Range   Sodium 139  135 - 145 mEq/L   Potassium 3.9  3.5 - 5.1 mEq/L   Chloride 101  96 - 112 mEq/L   BUN 8  6 - 23 mg/dL   Creatinine, Ser 2.95 (*) 0.50 - 1.35 mg/dL   Glucose, Bld 88  70 - 99 mg/dL   Calcium, Ion 6.21 (*) 1.12 - 1.23 mmol/L   TCO2 26  0 - 100 mmol/L   Hemoglobin 15.0  13.0 - 17.0 g/dL   HCT 30.8  65.7 - 84.6 %     No diagnosis found.  1. Alcohol dependence  MDM  Waiting on [facility] to assist with placement.  Unable to be accepted at [facility] due to high alcohol level. Patient to go home and return tomorrow after contacting [facility] crisis for clearance and placement.  Patient is not suicidal or homicidal. Stable for discharge.   Rodena Medin, PA-C 07/01/12 1900

## 2012-07-02 ENCOUNTER — Emergency Department (HOSPITAL_BASED_OUTPATIENT_CLINIC_OR_DEPARTMENT_OTHER)
Admission: EM | Admit: 2012-07-02 | Discharge: 2012-07-02 | Disposition: A | Discharge status: Home / Self Care | Payer: Self-pay | Attending: Emergency Medicine | Admitting: Emergency Medicine

## 2012-07-02 DIAGNOSIS — D869 Sarcoidosis, unspecified: Secondary | ICD-10-CM | POA: Insufficient documentation

## 2012-07-02 DIAGNOSIS — F1020 Alcohol dependence, uncomplicated: Secondary | ICD-10-CM | POA: Insufficient documentation

## 2012-07-02 DIAGNOSIS — K219 Gastro-esophageal reflux disease without esophagitis: Secondary | ICD-10-CM | POA: Insufficient documentation

## 2012-07-02 DIAGNOSIS — F10939 Alcohol use, unspecified with withdrawal, unspecified: Secondary | ICD-10-CM | POA: Insufficient documentation

## 2012-07-02 DIAGNOSIS — F10239 Alcohol dependence with withdrawal, unspecified: Secondary | ICD-10-CM | POA: Insufficient documentation

## 2012-07-02 LAB — ETHANOL: Alcohol, Ethyl (B): <11 mg/dL (ref 0–11)

## 2012-07-02 LAB — RAPID URINE DRUG SCREEN, HOSP PERFORMED
Amphetamines: NOT DETECTED
Barbiturates: NOT DETECTED
Benzodiazepines: NOT DETECTED
Cocaine: NOT DETECTED
Opiates: NOT DETECTED
Tetrahydrocannabinol: NOT DETECTED

## 2012-07-02 LAB — LIPASE, BLOOD: Lipase: 179 U/L — ABNORMAL HIGH (ref 11–59)

## 2012-07-02 MED ORDER — THIAMINE HCL 100 MG/ML IJ SOLN: 100.0000 mg | Freq: Every day | INTRAMUSCULAR | Status: DC

## 2012-07-02 MED ORDER — ADULT MULTIVITAMIN W/MINERALS CH
ORAL_TABLET | ORAL | Status: AC
  Filled 2012-07-02: qty 1

## 2012-07-02 MED ORDER — FOLIC ACID 1 MG PO TABS
1.0000 mg | ORAL_TABLET | Freq: Every day | ORAL | Status: DC
  Administered 2012-07-02: 1 mg via ORAL

## 2012-07-02 MED ORDER — ADULT MULTIVITAMIN W/MINERALS CH: 1.0000 | ORAL_TABLET | Freq: Every day | ORAL | Status: DC

## 2012-07-02 MED ORDER — VITAMIN B-1 100 MG PO TABS
100.0000 mg | ORAL_TABLET | Freq: Every day | ORAL | Status: DC
  Administered 2012-07-02 (×2): 100 mg via ORAL

## 2012-07-02 MED ORDER — LORAZEPAM 2 MG/ML IJ SOLN: 1.0000 mg | Freq: Four times a day (QID) | INTRAMUSCULAR | Status: DC | PRN

## 2012-07-02 MED ORDER — LORAZEPAM 1 MG PO TABS
1.0000 mg | ORAL_TABLET | Freq: Four times a day (QID) | ORAL | Status: DC | PRN
  Administered 2012-07-02 (×2): 1 mg via ORAL
  Filled 2012-07-02 (×2): qty 1

## 2012-07-02 MED ORDER — VITAMIN B-1 100 MG PO TABS
ORAL_TABLET | ORAL | Status: AC
  Administered 2012-07-02: 100 mg via ORAL
  Filled 2012-07-02: qty 1

## 2012-07-02 MED ORDER — LORAZEPAM 1 MG PO TABS: 1.0000 mg | ORAL_TABLET | Freq: Three times a day (TID) | ORAL | Status: AC | PRN

## 2012-07-02 MED ORDER — FOLIC ACID 1 MG PO TABS
ORAL_TABLET | ORAL | Status: AC
  Filled 2012-07-02: qty 1

## 2012-07-02 NOTE — ED Provider Notes (Addendum)
History     CSN: [id number]  Arrival date & time 07/02/12  0717   First MD Initiated Contact with Patient 07/02/12 [phone]      Chief Complaint  Patient presents with  . Medical Clearance    (Consider location/radiation/quality/duration/timing/severity/associated sxs/prior treatment) HPI Comments: Patient presents requesting alcohol detox. He was seen here yesterday for the same thing but his alcohol level was too high so he was sent home and was told not to drink tonight and advised to come back today to have her repeat alcohol level and to attempt placement into a rehabilitation facility. He states that he currently drinks about 4 24 ounce beers daily. This has been going on for 6 or 7 months. He has been in rehabilitation several times in the past. Denies any drug use. Denies any suicidal ideations. He has some intermittent pains in his epigastric area but denies any pain currently. He has some mild nausea but no vomiting. His last reading was at 10 PM last night. He states he is feeling shaky currently but denies any hallucinations  The history is provided by the patient.    Past Medical History  Diagnosis Date  . Alcohol abuse   . Sarcoidosis of lung   . Reflux   . Depression   . Anxiety     Past Surgical History  Procedure Date  . Knee arthroscopy     No family history on file.  History  Substance Use Topics  . Smoking status: Never Smoker   . Smokeless tobacco: Not on file  . Alcohol Use: Yes     4-5  24 oz cans of beer daily      Review of Systems  Constitutional: Negative for fever, chills, diaphoresis and fatigue.  HENT: Negative for congestion, rhinorrhea and sneezing.   Eyes: Negative.   Respiratory: Negative for cough, chest tightness and shortness of breath.   Cardiovascular: Negative for chest pain and leg swelling.  Gastrointestinal: Positive for nausea. Negative for vomiting, abdominal pain and diarrhea.  Genitourinary: Negative for frequency,  hematuria, flank pain and difficulty urinating.  Musculoskeletal: Negative for back pain and arthralgias.  Skin: Negative for rash.  Neurological: Positive for tremors. Negative for dizziness, speech difficulty, weakness, numbness and headaches.  Psychiatric/Behavioral: Negative for suicidal ideas and confusion. The patient is nervous/anxious.     Allergies  Review of patient's allergies indicates no known allergies.  Home Medications   Current Outpatient Rx  Name Route Sig Dispense Refill  . LORAZEPAM 1 MG PO TABS Oral Take 0.5 tablets (0.5 mg total) by mouth 3 (three) times daily as needed for anxiety. 3 tablet 0    BP 151/95  Pulse 77  Temp 98.2 F (36.8 C)  Resp 20  SpO2 98%  Physical Exam  Constitutional: He is oriented to person, place, and time. He appears well-developed and well-nourished.       Mildly anxious with slight tremor  HENT:  Head: Normocephalic and atraumatic.  Eyes: Pupils are equal, round, and reactive to light.  Neck: Normal range of motion. Neck supple.  Cardiovascular: Normal rate, regular rhythm and normal heart sounds.   Pulmonary/Chest: Effort normal and breath sounds normal. No respiratory distress. He has no wheezes. He has no rales. He exhibits no tenderness.  Abdominal: Soft. Bowel sounds are normal. There is no tenderness. There is no rebound and no guarding.  Musculoskeletal: Normal range of motion. He exhibits no edema.  Lymphadenopathy:    He has no cervical adenopathy.  Neurological:  He is alert and oriented to person, place, and time.  Skin: Skin is warm and dry. No rash noted.  Psychiatric: He has a normal mood and affect.    ED Course  Procedures (including critical care time)  Results for orders placed during the hospital encounter of 07/02/12  ETHANOL      Component Value Range   Alcohol, Ethyl (B) <11  0 - 11 mg/dL  LIPASE, BLOOD      Component Value Range   Lipase 179 (*) 11 - 59 U/L  URINE RAPID DRUG SCREEN (HOSP  PERFORMED)      Component Value Range   Opiates NONE DETECTED  NONE DETECTED   Cocaine NONE DETECTED  NONE DETECTED   Benzodiazepines NONE DETECTED  NONE DETECTED   Amphetamines NONE DETECTED  NONE DETECTED   Tetrahydrocannabinol NONE DETECTED  NONE DETECTED   Barbiturates NONE DETECTED  NONE DETECTED   No results found.    1. Alcohol withdrawal       MDM  Pt with alcohol withdrawal symptoms.  No evidence of DTs.  Will start CIWA protocol, recheck ETOH level and lipase which was mildly elevated yesterday and notify [facility]   1142 patient has been accepted to [facility]. Will be driven there by a family member     Rolan Bucco, MD 07/02/12 7829  Rolan Bucco, MD 07/02/12 [phone]

## 2012-07-02 NOTE — ED Notes (Signed)
[facility] Jan here

## 2012-07-02 NOTE — ED Notes (Signed)
[facility] called

## 2012-07-02 NOTE — ED Provider Notes (Signed)
Medical screening examination/treatment/procedure(s) were performed by non-physician practitioner and as supervising physician I was immediately available for consultation/collaboration.   Gerhard Munch, MD 07/02/12 [phone]

## 2012-07-02 NOTE — ED Notes (Signed)
Jan mobile crisis will be here within - 1 hr

## 2012-07-02 NOTE — ED Notes (Signed)
Pt relaxed sitting on stretcher. Awaiting ok for [facility] tx. Denies nausea or vomiting denies pain

## 2012-07-02 NOTE — ED Notes (Signed)
Here for medical clearance.

## 2013-07-10 ENCOUNTER — Ambulatory Visit (INDEPENDENT_AMBULATORY_CARE_PROVIDER_SITE_OTHER): Payer: BC Managed Care – PPO | Admitting: Family Medicine

## 2013-07-10 ENCOUNTER — Encounter: Payer: Self-pay | Admitting: Family Medicine

## 2013-07-10 VITALS — BP 132/90 | Temp 98.4°F | Ht 69.25 in | Wt 140.0 lb

## 2013-07-10 DIAGNOSIS — G5601 Carpal tunnel syndrome, right upper limb: Secondary | ICD-10-CM

## 2013-07-10 DIAGNOSIS — F1010 Alcohol abuse, uncomplicated: Secondary | ICD-10-CM

## 2013-07-10 DIAGNOSIS — M25531 Pain in right wrist: Secondary | ICD-10-CM

## 2013-07-10 DIAGNOSIS — Z7189 Other specified counseling: Secondary | ICD-10-CM

## 2013-07-10 DIAGNOSIS — F99 Mental disorder, not otherwise specified: Secondary | ICD-10-CM

## 2013-07-10 DIAGNOSIS — F489 Nonpsychotic mental disorder, unspecified: Secondary | ICD-10-CM

## 2013-07-10 DIAGNOSIS — M25539 Pain in unspecified wrist: Secondary | ICD-10-CM

## 2013-07-10 DIAGNOSIS — Z7689 Persons encountering health services in other specified circumstances: Secondary | ICD-10-CM

## 2013-07-10 DIAGNOSIS — M549 Dorsalgia, unspecified: Secondary | ICD-10-CM

## 2013-07-10 DIAGNOSIS — G5600 Carpal tunnel syndrome, unspecified upper limb: Secondary | ICD-10-CM

## 2013-07-10 NOTE — Patient Instructions (Addendum)
-  We have ordered labs or studies at this visit. It can take up to 1-2 weeks for results and processing. We will contact you with instructions IF your results are abnormal. Normal results will be released to your [facility]. If you have not heard from [place] or can not find your results in [facility] in 2 weeks please contact our office.  -PLEASE SIGN UP FOR MYCHART TODAY   We recommend the following healthy lifestyle measures: - eat a healthy diet consisting of lots of vegetables, fruits, beans, nuts, seeds, healthy meats such as white chicken and fish and whole grains.  - avoid fried foods, fast food, processed foods, sodas, red meet and other fattening foods.  - get a least 150 minutes of aerobic exercise per week.   I think your wrist pain is likely carpal tunnel and tendon related and I would advise wearing a cock up brace at night in the meantime until you see the orthopedic doctor.   Follow up with the specialist for your pain  Follow up in one month with [place] for a physical exam

## 2013-07-10 NOTE — Progress Notes (Addendum)
Chief Complaint  Patient presents with  . Establish Care  . bilateral hand spasms  . back strain    HPI:  [name] [name] is here to establish care. Recently moved back here from [place].  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, now doing more stuffing and cushion -lots of pulling and lifting -R hand dominant and has more pain in R hand, notices more with work and at night, R thenar and wrist pain, some tingling in thumb and first finger and feels like some weakness in this thumb too -he wants a referral to an orthopedic doctor -he is asking for relief from heavy lifting until sees orthopedics -occ muscle pain in other hand as well at work  Back Pain:  -started today -shoulder and thoracic areas -muscle pain  Sarcoidosis: -reports was diagnosed 20 years ago and was on steroids shortly and reports saw a pulmonologist a few times since for this and was fine and has not had any symptoms or issues with this or treatment in many years -denies: cough, SOB, DOE, fevers  Alcoholism/Anxiety: -major issues in the past/on trileptal and trazadone in the past but reports he is fine now -had gone through rehab and was doing well but now having relapse for last 3 months but report he thinks he is doing well -drinks a minimum of 3 beers daily and more then 7 per day on weekends "who knows" when asked how many on weekends -reports [phone] psych services there as well - has appointment scheduled - considering rehab treatment for alcoholism  There are no active problems to display for this patient.  Health Maintenance:  ROS: See pertinent positives and negatives per HPI.  Past Medical History  Diagnosis Date  . Alcohol abuse   . Sarcoidosis of lung   . Reflux   .  Depression   . Anxiety     Family History  Problem Relation Age of Onset  . Diabetes Father   . Hypertension Father   . Hypertension Paternal Grandfather     History   Social History  . Marital Status: Legally Separated    Spouse Name: N/A    Number of Children: N/A  . Years of Education: N/A   Social History Main Topics  . Smoking status: Never Smoker   . Smokeless tobacco: None  . Alcohol Use: Yes     Comment: 4-5  24 oz cans of beer daily; updated on 07/10/13 about 3 cans of beer after work  . Drug Use: No  . Sexual Activity: None   Other Topics Concern  . None   Social History Narrative  . None    No current outpatient prescriptions on file.  EXAM:  Filed Vitals:   07/10/13 1558  BP: 132/90  Temp: 98.4 F (36.9 C)    Body mass index is 20.52 kg/(m^2).  GENERAL: vitals reviewed and listed above, alert, oriented, appears well hydrated and in no acute distress  HEENT: atraumatic, conjunttiva clear, no obvious abnormalities on inspection of external nose and ears  NECK: no obvious masses on inspection  LUNGS: clear to auscultation bilaterally, no wheezes, rales or rhonchi, good air movement  CV: HRRR, no peripheral edema  MS: moves all extremities without noticeable abnormality R wrist TTP in thenar eminence area, +  phalens/tinels, no weakness in grip strength or thumb motions or wrist or forearm muscles, normal sensation and strength in upper extremities bilaterally. Neg spurlings. No winging scapula. No bony TTP in cervical or thoracic region. Milld TTP rhomboids bilat.  PSYCH: anxious and somewhat argumentative during visit.  ASSESSMENT AND PLAN:  Discussed the following assessment and plan:  Wrist pain, acute, right - Plan: Ambulatory referral to Orthopedic Surgery - I suspect CTS and made recommendations for this but he became angry and raised his voice when I suggested a cock up brace and when I asked why he was upset he said I was trying to say  he isn't in pain and has no weakness. I told him I did not perceive any weakness on exam, but that I believe he has pain. He stated theat my "little exam" can't tell about weakness when he lifts 300lbs. He wants to see orthopedic specialist for this. - referral placed, cockup brace at night advised in meantime - applied and fitted -he wanted letter saying he was referred to ortho - provided -will defer to ortho for any work restriction recomendaions  Carpal tunnel syndrome, right - Plan: Ambulatory referral to Orthopedic Surgery -see above  Encounter to establish care  Back pain - mild, started today -seems to be simply muscle strain, mild  -advised proper lifting mechanics -advised against strong pain medications given alcohol use  Alcohol abuse/Psychiatric disorder: -he denies serious psych disorder or hospitalization for psych issues in the past -his speech and mood fluctuated during visit -he accused me of trying to send records of his alcohol use to his work - I assured him I was doing no such thing -he reported he didn't want "any of that on his record" -I notified him the diagnosis of alcohol use/abuse, past psych medications, visits to ED and psych history were already in his medical record, but that I had no intention of sending that to his work at this point -he reports he is seeing Dr. Neva Seat and [facility] for his [facility] and alcohol issues -advised him to cut back on alcohol use and seek help - which he insists he has already  -We reviewed the PMH, PSH, FH, SH, Meds and Allergies. -We provided refills for any medications we will prescribe as needed. -We addressed current concerns per orders and patient instructions. -We have asked for records for pertinent exams, studies, vaccines and notes from previous providers. -discussed given no medical care in some time - physical and labs advise, he prefers to return for this ->45 minutes spent face to face with  this patient  -Patient advised to return or notify a doctor immediately if symptoms worsen or persist or new concerns arise.  Patient Instructions  -We have ordered labs or studies at this visit. It can take up to 1-2 weeks for results and processing. We will contact you with instructions IF your results are abnormal. Normal results will be released to your [facility]. If you have not heard from [place] or can not find your results in [facility] in 2 weeks please contact our office.  -PLEASE SIGN UP FOR MYCHART TODAY   We recommend the following healthy lifestyle measures: - eat a healthy diet consisting of lots of vegetables, fruits, beans, nuts, seeds, healthy meats such as white chicken and fish and whole grains.  - avoid fried foods, fast food, processed foods, sodas, red meet and other fattening foods.  - get a least 150 minutes of aerobic exercise per  week.   I think your wrist pain is likely carpal tunnel and tendon related and I would advise wearing a cock up brace at night in the meantime until you see the orthopedic doctor.   Follow up with the specialist for your pain  Follow up in one month with [place] for a physical exam     KIM, HANNAH R.

## 2013-07-23 ENCOUNTER — Telehealth: Payer: Self-pay | Admitting: Family Medicine

## 2013-07-23 NOTE — Telephone Encounter (Signed)
Patient Information:  Caller Name: [place]  Phone: [phone]  Patient: [name] [name], [name] [name]  Gender: Male  DOB: Feb 03, 1961  Age: 52 Years  PCP: Kriste Basque [facility])  Office Follow Up:  Does the office need to follow up with this patient?: No  Instructions For The Office: N/A   Symptoms  Reason For Call & Symptoms: Calling about R ear blocked for past 4 days. No pain in ear. Afebrile. He has some sinus pressure and occasional sneezing with some greenish nasal drainage. He has had sinus issues in the past.   Reviewed Health History In EMR: Yes  Reviewed Medications In EMR: Yes  Reviewed Allergies In EMR: Yes  Reviewed Surgeries / Procedures: Yes  Date of Onset of Symptoms: 07/19/2013  Treatments Tried: none   Guideline(s) Used:  Sinus Pain and Congestion  Ear - Congestion  Disposition Per Guideline:   See Within 3 Days in Office  Reason For Disposition Reached:   Ear congestion present > 48 hours  Advice Given:  Reassurance:   Sinus congestion is a normal part of a cold.  Usually home treatment with nasal washes can prevent an actual bacterial sinus infection.  For a Stuffy Nose - Use Nasal Washes:  Introduction: Saline (salt water) nasal irrigation (nasal wash) is an effective and simple home remedy for treating stuffy nose and sinus congestion. The nose can be irrigated by pouring, spraying, or squirting salt water into the nose and then letting it run back out.  Nasal Decongestants for a Very Stuffy or Runny Nose:  Pseudoephedrine (Sudafed) is available OTC in pill form. Typical adult dosage is two 30 mg tablets every 6 hours.  Hydration:  Drink plenty of liquids (6-8 glasses of water daily). If the air in your home is dry, use a cool mist humidifier  Expected Course:  Sinus congestion from viral upper respiratory infections (colds) usually lasts 5-10 days.  Occasionally a cold can worsen and turn into bacterial sinusitis. Clues to this are sinus symptoms lasting  longer than 10 days, fever lasting longer than 3 days, and worsening pain. Bacterial sinusitis may need antibiotic treatment.  Call Back If:   Severe pain lasts longer than 2 hours after pain medicine  Sinus pain lasts longer than 1 day after starting treatment using nasal washes  Sinus congestion (fullness) lasts longer than 10 days  Fever lasts longer than 3 days  You become worse.  Reassurance:  Definition: Ear congestion is the medical term used to describe symptoms of a stuffy, full, or plugged sensation in the ear. People also sometimes describe crackling or popping noises in the ear. Sometimes hearing seems slightly muffled.  Eustacian tube: There is a small collapsible tube that runs between the middle ear and the nose. Normally, it permits tiny amounts of air to move in and out of the middle ear. When the tube gets blocked, air or fluid can build up behind the ear drum (tympanic membrane). This causes the symptoms of ear congestion.  Here is some care advice that should help.  Causes  Upper respiratory infections (colds) and nasal allergies (hay fever) can block the eustachian tube.  Blowing the nose too hard can also push air and fluid into the eustachian tube.  Treatment - Decongestant Nasal Spray:  If chewing or swallowing doesn't help after 1 or 2 hours, you can try using an over-the-counter (OTC) nasal decongestant drops. You can use the nose drops twice a day.  Caution - Nasal Decongestants:  Do not  use these medications for more than 3 days (Reason: rebound nasal congestion).  Expected Course:   The symptoms usually get better within 2 days (48 hours) with treatment.  It is safe to swim.  Call Back If:   Ear congestion lasts over 48 hours  Ear pain or fever occurs  You become worse.  Patient Refused Recommendation:  Patient Will Follow Up With Office Later  Wants to try home care to treat congestion and will call back if not helping.

## 2013-08-01 ENCOUNTER — Encounter: Payer: BC Managed Care – PPO | Admitting: Family Medicine

## 2013-08-01 NOTE — Progress Notes (Signed)
Late cancel  This encounter was created in error - please disregard. 

## 2013-09-01 ENCOUNTER — Emergency Department (HOSPITAL_BASED_OUTPATIENT_CLINIC_OR_DEPARTMENT_OTHER)
Admission: EM | Admit: 2013-09-01 | Discharge: 2013-09-01 | Disposition: A | Discharge status: Other Acute Inpatient Hospital | Payer: BC Managed Care – PPO | Attending: Emergency Medicine | Admitting: Emergency Medicine

## 2013-09-01 ENCOUNTER — Encounter (HOSPITAL_BASED_OUTPATIENT_CLINIC_OR_DEPARTMENT_OTHER): Payer: Self-pay | Admitting: Emergency Medicine

## 2013-09-01 ENCOUNTER — Encounter (HOSPITAL_COMMUNITY): Payer: Self-pay | Admitting: *Deleted

## 2013-09-01 ENCOUNTER — Inpatient Hospital Stay (HOSPITAL_COMMUNITY)
Admission: AD | Admit: 2013-09-01 | Discharge: 2013-09-05 | DRG: 751 | Disposition: A | Discharge status: Home / Self Care | Payer: BC Managed Care – PPO | Source: Intra-hospital | Attending: Psychiatry | Admitting: Psychiatry

## 2013-09-01 ENCOUNTER — Emergency Department (HOSPITAL_BASED_OUTPATIENT_CLINIC_OR_DEPARTMENT_OTHER): Payer: BC Managed Care – PPO

## 2013-09-01 DIAGNOSIS — F410 Panic disorder [episodic paroxysmal anxiety] without agoraphobia: Secondary | ICD-10-CM | POA: Diagnosis present

## 2013-09-01 DIAGNOSIS — F10939 Alcohol use, unspecified with withdrawal, unspecified: Principal | ICD-10-CM

## 2013-09-01 DIAGNOSIS — Z8619 Personal history of other infectious and parasitic diseases: Secondary | ICD-10-CM | POA: Insufficient documentation

## 2013-09-01 DIAGNOSIS — K861 Other chronic pancreatitis: Secondary | ICD-10-CM

## 2013-09-01 DIAGNOSIS — F1020 Alcohol dependence, uncomplicated: Secondary | ICD-10-CM

## 2013-09-01 DIAGNOSIS — Z8659 Personal history of other mental and behavioral disorders: Secondary | ICD-10-CM | POA: Insufficient documentation

## 2013-09-01 DIAGNOSIS — Z8669 Personal history of other diseases of the nervous system and sense organs: Secondary | ICD-10-CM | POA: Insufficient documentation

## 2013-09-01 DIAGNOSIS — F411 Generalized anxiety disorder: Secondary | ICD-10-CM

## 2013-09-01 DIAGNOSIS — F1010 Alcohol abuse, uncomplicated: Secondary | ICD-10-CM | POA: Insufficient documentation

## 2013-09-01 DIAGNOSIS — Z79899 Other long term (current) drug therapy: Secondary | ICD-10-CM

## 2013-09-01 DIAGNOSIS — F10239 Alcohol dependence with withdrawal, unspecified: Principal | ICD-10-CM

## 2013-09-01 HISTORY — DX: Acute pancreatitis without necrosis or infection, unspecified: K85.90

## 2013-09-01 HISTORY — DX: Carpal tunnel syndrome, right upper limb: G56.01

## 2013-09-01 LAB — CBC WITH DIFFERENTIAL/PLATELET
Band Neutrophils: 0 % (ref 0–10)
Basophils Absolute: 0 10*3/uL (ref 0.0–0.1)
Basophils Relative: 0 % (ref 0–1)
Blasts: 0 %
Eosinophils Absolute: 0 10*3/uL (ref 0.0–0.7)
Eosinophils Relative: 0 % (ref 0–5)
HCT: 42.3 % (ref 39.0–52.0)
Hemoglobin: 13.9 g/dL (ref 13.0–17.0)
Lymphocytes Relative: 40 % (ref 12–46)
Lymphs Abs: 2.5 10*3/uL (ref 0.7–4.0)
MCH: 27.1 pg (ref 26.0–34.0)
MCHC: 32.9 g/dL (ref 30.0–36.0)
MCV: 82.6 fL (ref 78.0–100.0)
Metamyelocytes Relative: 0 %
Monocytes Absolute: 0.3 10*3/uL (ref 0.1–1.0)
Monocytes Relative: 5 % (ref 3–12)
Myelocytes: 0 %
Neutro Abs: 3.4 10*3/uL (ref 1.7–7.7)
Neutrophils Relative %: 53 % (ref 43–77)
Platelets: 201 10*3/uL (ref 150–400)
Promyelocytes Absolute: 2 %
RBC: 5.12 MIL/uL (ref 4.22–5.81)
RDW: 15 % (ref 11.5–15.5)
WBC: 6.2 10*3/uL (ref 4.0–10.5)
nRBC: 0 /100 WBC

## 2013-09-01 LAB — COMPREHENSIVE METABOLIC PANEL
ALT: 25 U/L (ref 0–53)
AST: 33 U/L (ref 0–37)
Albumin: 4.1 g/dL (ref 3.5–5.2)
Alkaline Phosphatase: 38 U/L — ABNORMAL LOW (ref 39–117)
BUN: 12 mg/dL (ref 6–23)
CO2: 28 mEq/L (ref 19–32)
Calcium: 9.8 mg/dL (ref 8.4–10.5)
Chloride: 93 mEq/L — ABNORMAL LOW (ref 96–112)
Creatinine, Ser: 0.9 mg/dL (ref 0.50–1.35)
GFR calc Af Amer: >90 mL/min (ref >90)
GFR calc non Af Amer: >90 mL/min (ref >90)
Glucose, Bld: 98 mg/dL (ref 70–99)
Potassium: 4 mEq/L (ref 3.5–5.1)
Sodium: 133 mEq/L — ABNORMAL LOW (ref 135–145)
Total Bilirubin: 0.4 mg/dL (ref 0.3–1.2)
Total Protein: 8.6 g/dL — ABNORMAL HIGH (ref 6.0–8.3)

## 2013-09-01 LAB — RAPID URINE DRUG SCREEN, HOSP PERFORMED
Amphetamines: NOT DETECTED
Barbiturates: NOT DETECTED
Benzodiazepines: NOT DETECTED
Cocaine: NOT DETECTED
Opiates: NOT DETECTED
Tetrahydrocannabinol: NOT DETECTED

## 2013-09-01 LAB — ETHANOL: Alcohol, Ethyl (B): 277 mg/dL — ABNORMAL HIGH (ref 0–11)

## 2013-09-01 LAB — LIPASE, BLOOD: Lipase: 207 U/L — ABNORMAL HIGH (ref 11–59)

## 2013-09-01 MED ORDER — CHLORDIAZEPOXIDE HCL 25 MG PO CAPS
25.0000 mg | ORAL_CAPSULE | Freq: Three times a day (TID) | ORAL | Status: AC
  Administered 2013-09-03 (×3): 25 mg via ORAL
  Filled 2013-09-01 (×3): qty 1

## 2013-09-01 MED ORDER — IOHEXOL 300 MG/ML  SOLN
100.0000 mL | Freq: Once | INTRAMUSCULAR | Status: AC | PRN
  Administered 2013-09-01: 100 mL via INTRAVENOUS

## 2013-09-01 MED ORDER — CHLORDIAZEPOXIDE HCL 25 MG PO CAPS
25.0000 mg | ORAL_CAPSULE | Freq: Four times a day (QID) | ORAL | Status: AC
  Administered 2013-09-01 – 2013-09-02 (×5): 25 mg via ORAL
  Filled 2013-09-01 (×5): qty 1

## 2013-09-01 MED ORDER — VITAMIN B-1 100 MG PO TABS
100.0000 mg | ORAL_TABLET | Freq: Every day | ORAL | Status: DC
  Administered 2013-09-02 – 2013-09-05 (×4): 100 mg via ORAL
  Filled 2013-09-01 (×7): qty 1

## 2013-09-01 MED ORDER — ADULT MULTIVITAMIN W/MINERALS CH
1.0000 | ORAL_TABLET | Freq: Every day | ORAL | Status: DC
  Administered 2013-09-01 – 2013-09-05 (×5): 1 via ORAL
  Filled 2013-09-01 (×9): qty 1

## 2013-09-01 MED ORDER — CHLORDIAZEPOXIDE HCL 25 MG PO CAPS
50.0000 mg | ORAL_CAPSULE | Freq: Once | ORAL | Status: AC
  Administered 2013-09-01: 50 mg via ORAL
  Filled 2013-09-01: qty 2

## 2013-09-01 MED ORDER — ACETAMINOPHEN 325 MG PO TABS: 650.0000 mg | ORAL_TABLET | Freq: Four times a day (QID) | ORAL | Status: DC | PRN

## 2013-09-01 MED ORDER — CHLORDIAZEPOXIDE HCL 25 MG PO CAPS
25.0000 mg | ORAL_CAPSULE | Freq: Every day | ORAL | Status: AC
  Administered 2013-09-05: 25 mg via ORAL
  Filled 2013-09-01: qty 1

## 2013-09-01 MED ORDER — CHLORDIAZEPOXIDE HCL 25 MG PO CAPS: 25.0000 mg | ORAL_CAPSULE | Freq: Four times a day (QID) | ORAL | Status: AC | PRN

## 2013-09-01 MED ORDER — LOPERAMIDE HCL 2 MG PO CAPS: 2.0000 mg | ORAL_CAPSULE | ORAL | Status: AC | PRN

## 2013-09-01 MED ORDER — ALUM & MAG HYDROXIDE-SIMETH 200-200-20 MG/5ML PO SUSP: 30.0000 mL | ORAL | Status: DC | PRN

## 2013-09-01 MED ORDER — THIAMINE HCL 100 MG/ML IJ SOLN
100.0000 mg | Freq: Once | INTRAMUSCULAR | Status: AC
  Administered 2013-09-01: 100 mg via INTRAMUSCULAR
  Filled 2013-09-01: qty 2

## 2013-09-01 MED ORDER — CHLORDIAZEPOXIDE HCL 25 MG PO CAPS
25.0000 mg | ORAL_CAPSULE | ORAL | Status: AC
  Administered 2013-09-04 (×2): 25 mg via ORAL
  Filled 2013-09-01 (×2): qty 1

## 2013-09-01 MED ORDER — MAGNESIUM HYDROXIDE 400 MG/5ML PO SUSP: 30.0000 mL | Freq: Every day | ORAL | Status: DC | PRN

## 2013-09-01 MED ORDER — IOHEXOL 300 MG/ML  SOLN
50.0000 mL | Freq: Once | INTRAMUSCULAR | Status: AC | PRN
  Administered 2013-09-01: 50 mL via ORAL

## 2013-09-01 MED ORDER — HYDROXYZINE HCL 25 MG PO TABS
25.0000 mg | ORAL_TABLET | Freq: Four times a day (QID) | ORAL | Status: AC | PRN
  Administered 2013-09-01: 25 mg via ORAL
  Filled 2013-09-01: qty 1

## 2013-09-01 MED ORDER — ONDANSETRON 4 MG PO TBDP: 4.0000 mg | ORAL_TABLET | Freq: Four times a day (QID) | ORAL | Status: AC | PRN

## 2013-09-01 NOTE — Progress Notes (Signed)
Patient accepted to [facility] with Dr Dub Mikes to bed 300/01. Carney Bern, LCSW

## 2013-09-01 NOTE — Tx Team (Signed)
Initial Interdisciplinary Treatment Plan  PATIENT STRENGTHS: (choose at least two) Ability for insight Average or above average intelligence Capable of independent living Financial means General fund of knowledge Physical Health Supportive family/friends  PATIENT STRESSORS: Substance abuse   PROBLEM LIST: Problem List/Patient Goals Date to be addressed Date deferred Reason deferred Estimated date of resolution  Anxiety 09/01/2013     Substance Abuse 09/01/2013                                                DISCHARGE CRITERIA:  Improved stabilization in mood, thinking, and/or behavior Medical problems require only outpatient monitoring Reduction of life-threatening or endangering symptoms to within safe limits Withdrawal symptoms are absent or subacute and managed without 24-hour nursing intervention  PRELIMINARY DISCHARGE PLAN: Return to previous living arrangement Return to previous work or school arrangements  PATIENT/FAMIILY INVOLVEMENT: This treatment plan has been presented to and reviewed with the patient, [name] [name].  The patient and family have been given the opportunity to ask questions and make suggestions.  [name] [name] 09/01/2013, 6:56 PM

## 2013-09-01 NOTE — ED Notes (Signed)
Pt reports wanting to "get rehab for alcohol and hopefully got to [facility]". Also reports upper and lower abd pain "from my pancreas". Pt reports being dx w/pancreatitis

## 2013-09-01 NOTE — ED Provider Notes (Signed)
CSN: [id number]     Arrival date & time 09/01/13  1015 History   First MD Initiated Contact with Patient 09/01/13 1025     Chief Complaint  Patient presents with  . Alcohol Problem    ETOH  . Abdominal Pain   (Consider location/radiation/quality/duration/timing/severity/associated sxs/prior Treatment) HPI Comments: Pt states that he is here because he wants to go etoh detox at [facility]:pt state that he went in august of last year and now he is right back where he started where he is drinking 6-8 beers a WUJ:WJXBJY si/hi:pt state that he is also having generalized abdominal pain without fever, vomiting or diarrhea:pt states that he has had pancreatitis previously:pt states that his last beer was this morning  The history is provided by the patient. No language interpreter was used.    Past Medical History  Diagnosis Date  . Alcohol abuse   . Sarcoidosis of lung   . Reflux   . Depression   . Anxiety   . Pancreatitis   . Carpal tunnel syndrome of right wrist    Past Surgical History  Procedure Laterality Date  . Knee arthroscopy     Family History  Problem Relation Age of Onset  . Diabetes Father   . Hypertension Father   . Hypertension Paternal Grandfather    History  Substance Use Topics  . Smoking status: Never Smoker   . Smokeless tobacco: Not on file  . Alcohol Use: Yes     Comment: Relapsed 7/8 beers a day. Last today AM    Review of Systems  Constitutional: Negative.   Respiratory: Negative.   Cardiovascular: Negative.     Allergies  Review of patient's allergies indicates no known allergies.  Home Medications  No current outpatient prescriptions on file. BP 139/90  Pulse 83  Temp(Src) 98.6 F (37 C) (Oral)  Resp 16  Ht 5\' 9"  (1.753 m)  Wt 140 lb (63.504 kg)  BMI 20.67 kg/m2  SpO2 97% Physical Exam  Nursing note and vitals reviewed. Constitutional: He is oriented to person, place, and time. He appears well-developed and well-nourished.   Cardiovascular: Normal rate and regular rhythm.   Pulmonary/Chest: Effort normal and breath sounds normal.  Abdominal: Soft. Bowel sounds are normal. There is tenderness in the epigastric area.  Musculoskeletal: Normal range of motion.  Neurological: He is alert and oriented to person, place, and time.  Skin: Skin is warm and dry.  Psychiatric: He has a normal mood and affect.    ED Course  Procedures (including critical care time) Labs Review Labs Reviewed  ETHANOL - Abnormal; Notable for the following:    Alcohol, Ethyl (B) 277 (*)    All other components within normal limits  COMPREHENSIVE METABOLIC PANEL - Abnormal; Notable for the following:    Sodium 133 (*)    Chloride 93 (*)    Total Protein 8.6 (*)    Alkaline Phosphatase 38 (*)    All other components within normal limits  LIPASE, BLOOD - Abnormal; Notable for the following:    Lipase 207 (*)    All other components within normal limits  CBC WITH DIFFERENTIAL  URINE RAPID DRUG SCREEN (HOSP PERFORMED)  PATHOLOGIST SMEAR REVIEW   Imaging Review Ct Abdomen Pelvis W Contrast  09/01/2013   CLINICAL DATA:  Abdominal pain.  EXAM: CT ABDOMEN AND PELVIS WITH CONTRAST  TECHNIQUE: Multidetector CT imaging of the abdomen and pelvis was performed using the standard protocol following bolus administration of intravenous contrast.  CONTRAST:  50mL OMNIPAQUE IOHEXOL 300 MG/ML SOLN, OMNIPAQUE IOHEXOL 300 MG/ML SOLN  COMPARISON:  None.  FINDINGS: The lung bases appear emphysematous but are clear. Heart size is normal. No pneumothorax or pleural fluid.  The liver is low attenuating consistent with fatty infiltration. No focal liver lesion is identified. The gallbladder, adrenal glands, spleen, pancreas and kidneys appear normal. The biliary tree is unremarkable. There is no fluid collection or lymphadenopathy. Stomach and small bowel are unremarkable. There is a fairly large volume of stool throughout the colon. No focal bony  abnormality is seen.  IMPRESSION: No acute finding.  Fatty infiltration of liver.  Moderately large stool burden throughout the colon.   Electronically Signed   By: Drusilla Kanner M.D.   On: 09/01/2013 12:59    MDM   1. ETOH abuse   2. Chronic pancreatitis    Pt not having any withdrawal symptoms:pt is being transferred to [facility]:pt evaluated by [facility]:pt has chronic pancreatitis    Teressa Lower, NP 09/01/13 1413

## 2013-09-01 NOTE — BH Assessment (Signed)
Note entered in error Carney Bern, LCSW

## 2013-09-01 NOTE — ED Notes (Signed)
Report called to [facility] at [facility]

## 2013-09-01 NOTE — ED Notes (Signed)
Call to Minerva Areola at [facility] informed pt is medical cleared

## 2013-09-01 NOTE — ED Notes (Signed)
[facility] called for transport.  ETA 1 hour.

## 2013-09-01 NOTE — ED Notes (Signed)
[facility] called for transfer to [facility]

## 2013-09-01 NOTE — Progress Notes (Signed)
Patient ID: [name] [name], male   DOB: December 06, 1960, 52 y.o.   MRN: [id number] Patient comes in requesting etoh detox; patient etoh level was 277 on admission; patient reports he can drink up 7 beers or more daily; patient has a history of depression, pancreatitis, sarcoidosis and anxiety; patient is employed and his sister is a big support to him;

## 2013-09-01 NOTE — BH Assessment (Signed)
Tele Assessment Note   [name] [name] is an 52 y.o. male.   Abijah presented to [facility] with request for Alcohol Detox as he is drinking daily and feels he cannot stop on his own.  Patient relapsed in November of 2013 after being sober for 4-5 months and following up for med management (for anxiety) and therapy at [facility] the [place]. Patient reports he went off medications on October 2013  and began using alcohol to self medicate and is currently drinking six-seven 24 oz to 40 oz beers daily. Patient reports he is aware of connection between his drinking and pancreas issues and inability to focus on other basic needs pt has. Patient also aware that his alcohol use creates concern for family members including his parents, sisters and sons.  Patient requesting detox as "I can't stop on my own without being away from it." Patient reports he has had treatment before but does not want to give up on himself.  Patient has full time job in Landscape architect at [employer] and reports he has informed them he needed time away to get help.  Patient will need confirmation of any hospital stay for work.  Axis I: Alcohol Abuse and Anxiety Disorder NOS Axis II: Deferred Axis III:  Past Medical History  Diagnosis Date  . Alcohol abuse   . Sarcoidosis of lung   . Reflux   . Depression   . Anxiety   . Pancreatitis   . Carpal tunnel syndrome of right wrist    Axis IV: occupational problems, other psychosocial or environmental problems and problems related to social environment Axis V: 21-30 behavior considerably influenced by delusions or hallucinations OR serious impairment in judgment, communication OR inability to function in almost all areas  Past Medical History:  Past Medical History  Diagnosis Date  . Alcohol abuse   . Sarcoidosis of lung   . Reflux   . Depression   . Anxiety   . Pancreatitis   . Carpal tunnel syndrome of right wrist     Past Surgical History   Procedure Laterality Date  . Knee arthroscopy      Family History:  Family History  Problem Relation Age of Onset  . Diabetes Father   . Hypertension Father   . Hypertension Paternal Grandfather     Social History:  reports that he has never smoked. He does not have any smokeless tobacco history on file. He reports that  drinks alcohol. He reports that he does not use illicit drugs.  Additional Social History:  Alcohol / Drug Use Pain Medications: NA Prescriptions: None Over the Counter: NA History of alcohol / drug use?: Yes Longest period of sobriety (when/how long): 4-5 months in 2013  Negative Consequences of Use: Personal relationships Withdrawal Symptoms: Tremors;Patient aware of relationship between substance abuse and physical/medical complications;Other (Comment) (Anxiety ) Substance #1 Name of Substance 1: Alcohol 1 - Age of First Use: 19 1 - Amount (size/oz): Six to seven 24 to 40 ounce beers daily 1 - Frequency: Daily 1 - Duration: 11 months 1 - Last Use / Amount: This morning  CIWA: CIWA-Ar BP: 136/86 mmHg Pulse Rate: 82 COWS:    Allergies: No Known Allergies  Home Medications:  (Not in a hospital admission)  OB/GYN Status:  No LMP for male patient.  General Assessment Data Location of Assessment: [facility] Is this a Tele or Face-to-Face Assessment?: Tele Assessment Is this an Initial Assessment or a Re-assessment for this  encounter?: Initial Assessment Living Arrangements: Alone Can pt return to current living arrangement?: Yes Admission Status: Voluntary Is patient capable of signing voluntary admission?: Yes Transfer from: Home Referral Source: Self/Family/Friend     [facility] Crisis Care Plan Living Arrangements: Alone  Education Status Is patient currently in school?: No Current Grade: NA Highest grade of school patient has completed: 12  Risk to self Suicidal Ideation: No Suicidal Intent: No Is patient at risk for  suicide?: No Suicidal Plan?: No Access to Means: Yes Specify Access to Suicidal Means: Sharps, etc What has been your use of drugs/alcohol within the last 12 months?: Daily use of alcohol Previous Attempts/Gestures: No How many times?: 0 Other Self Harm Risks: Alcohol Use Intentional Self Injurious Behavior: None Family Suicide History: Unknown Persecutory voices/beliefs?: No Depression: No Substance abuse history and/or treatment for substance abuse?: Yes  Risk to Others Homicidal Ideation: No Thoughts of Harm to Others: No Current Homicidal Intent: No Current Homicidal Plan: No Access to Homicidal Means: No History of harm to others?: No Does patient have access to weapons?: No Criminal Charges Pending?: No Does patient have a court date: No  Psychosis Hallucinations: None noted  Mental Status Report Appear/Hygiene: Other (Comment) (Patient dressed in scrubs; difficult to discern) Eye Contact: Good Motor Activity: Unremarkable Speech: Logical/coherent Level of Consciousness: Alert Mood: Despair (Despair in reference to alcohol use) Affect: Appropriate to circumstance Anxiety Level: Moderate Thought Processes: Coherent Judgement: Unimpaired Orientation: Person;Place;Time;Situation Obsessive Compulsive Thoughts/Behaviors: None  Cognitive Functioning Concentration: Normal Memory: Recent Intact;Remote Intact IQ: Average Insight: Good Impulse Control: Good Appetite: Poor Weight Loss: 0 Weight Gain: 0 Sleep: Decreased Total Hours of Sleep: 5 Vegetative Symptoms: None  ADLScreening [facility] Assessment Services) Patient's cognitive ability adequate to safely complete daily activities?: Yes Patient able to express need for assistance with ADLs?: Yes Independently performs ADLs?: Yes (appropriate for developmental age)  Prior Inpatient Therapy Prior Inpatient Therapy: Yes Prior Therapy Dates: 2000, 2006, 2010 and 2013 Prior Therapy Facilty/Provider(s): Two inpatient  facilities in [place]; [facility], [place] Reason for Treatment: Ethol Detox and Substance Abuse Treatment  Prior Outpatient Therapy Prior Outpatient Therapy: Yes Prior Therapy Dates: 2013 Prior [facility] Facilty[facility] [place] Reason for Treatment: Medication   ADL Screening (condition at time of admission) Patient's cognitive ability adequate to safely complete daily activities?: Yes Patient able to express need for assistance with ADLs?: Yes Independently performs ADLs?: Yes (appropriate for developmental age) Abuse/Neglect Assessment (Assessment to be complete while patient is alone) Physical Abuse: Denies Verbal Abuse: Denies Sexual Abuse: Denies Exploitation of patient/patient's resources: Denies Self-Neglect: Denies Values / Beliefs Cultural Requests During Hospitalization: None Spiritual Requests During Hospitalization: None     Nutrition Screen- [facility] Adult/[facility] Patient's home diet: Regular  Additional Information 1:1 In Past 12 Months?: No CIRT Risk: No Elopement Risk: No Does patient have medical clearance?: Yes  Disposition:  Disposition Initial Assessment Completed for this Encounter: Yes Disposition of Patient: Inpatient treatment program Type of inpatient treatment program: Adult at [facility] room 300/1  Clide Dales 09/01/2013 2:03 PM

## 2013-09-01 NOTE — Progress Notes (Signed)
Pt observed sitting in the dayroom watching TV and talking with other patients.  Pt reports he was admitted this afternoon for alcohol detox.  He says he is tired of living this way and wants help getting off alcohol.  Pt says he has a supportive family.  He says he is feeling anxious, but is not having any other withdrawal symptoms at this time.  Pt denies SI/HI/AV.  Pt was encouraged to make his needs known to staff.  Pt voices understanding.  Support and encouragement offered.  Safety maintained with q15 minute checks.

## 2013-09-02 ENCOUNTER — Encounter (HOSPITAL_COMMUNITY): Payer: Self-pay | Admitting: Psychiatry

## 2013-09-02 DIAGNOSIS — F10239 Alcohol dependence with withdrawal, unspecified: Principal | ICD-10-CM | POA: Diagnosis present

## 2013-09-02 DIAGNOSIS — F10939 Alcohol use, unspecified with withdrawal, unspecified: Principal | ICD-10-CM | POA: Diagnosis present

## 2013-09-02 DIAGNOSIS — F410 Panic disorder [episodic paroxysmal anxiety] without agoraphobia: Secondary | ICD-10-CM

## 2013-09-02 DIAGNOSIS — F1010 Alcohol abuse, uncomplicated: Secondary | ICD-10-CM

## 2013-09-02 DIAGNOSIS — F411 Generalized anxiety disorder: Secondary | ICD-10-CM | POA: Diagnosis present

## 2013-09-02 DIAGNOSIS — F1020 Alcohol dependence, uncomplicated: Secondary | ICD-10-CM | POA: Diagnosis present

## 2013-09-02 LAB — PATHOLOGIST SMEAR REVIEW: Path Review: REACTIVE

## 2013-09-02 MED ORDER — GABAPENTIN 100 MG PO CAPS
200.0000 mg | ORAL_CAPSULE | Freq: Three times a day (TID) | ORAL | Status: DC
  Administered 2013-09-02 – 2013-09-05 (×10): 200 mg via ORAL
  Filled 2013-09-02 (×15): qty 2

## 2013-09-02 MED ORDER — TRAZODONE HCL 100 MG PO TABS
100.0000 mg | ORAL_TABLET | Freq: Every day | ORAL | Status: DC
  Administered 2013-09-02 – 2013-09-04 (×3): 100 mg via ORAL
  Filled 2013-09-02 (×5): qty 1

## 2013-09-02 NOTE — Progress Notes (Signed)
Pt reports he is doing well this evening.  He denies SI/HI/AV.  Pt denies any withdrawal symptoms at this time.  Pt says he wants to really succeed this time because he is tired of living his life this way.  He voiced appreciation to staff for their help and kindness.  Pt says he wants to go to long term treatment after detox.  Pt makes his needs known to staff.  Pt is participating in his treatment and unit activities.  Support and encouragement offered.  Safety maintained with q15 minute checks.

## 2013-09-02 NOTE — ED Provider Notes (Signed)
Medical screening examination/treatment/procedure(s) were performed by non-physician practitioner and as supervising physician I was immediately available for consultation/collaboration.   Candyce Churn, MD 09/02/13 [phone]

## 2013-09-02 NOTE — Progress Notes (Signed)
D: Patient denies SI/HI and A/V hallucinations; patient reports sleep was poor and that he did request medication; reports appetite is improving ; reports energy level is low ; reports ability to pay attention is good; patient reports he is having some cravings  A: Monitored q 15 minutes; patient encouraged to attend groups; patient educated about medications; patient given medications per physician orders; patient encouraged to express feelings and/or concerns  R: Patient is mildly anxious and has some mild tremors; patient is cooperative and appropriate; patient's interaction with staff and peers is appropriate;  patient is taking medications as prescribed and tolerating medications; patient is attending all groups and engaging
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
## 2013-09-02 NOTE — [facility] Suicide Risk Assessment (Signed)
Suicide Risk Assessment  Admission Assessment     Nursing information obtained from:  Patient Demographic factors:  Male;Living alone Current Mental Status:  NA Loss Factors:  NA Historical Factors:  Family history of mental illness or substance abuse Risk Reduction Factors:  Sense of responsibility to family;Religious beliefs about death;Employed;Positive social support  CLINICAL FACTORS:   Severe Anxiety and/or Agitation Alcohol/Substance Abuse/Dependencies  COGNITIVE FEATURES THAT CONTRIBUTE TO RISK:  Closed-mindedness Thought constriction (tunnel vision)    SUICIDE RISK:   Minimal: No identifiable suicidal ideation.  Patients presenting with no risk factors but with morbid ruminations; may be classified as minimal risk based on the severity of the depressive symptoms  PLAN OF CARE: Treatment Plan/Recommendations: Plan: Review of chart, vital signs, medications, and notes.  1-Admit for crisis management and stabilization. Estimated length of stay 5-7 days past his current stay of 1  2-Individual and group therapy encouraged  3-Medication management for alcohol withdrawal/detox and anxiety to reduce current symptoms to base line and improve the patient's overall level of functioning: Medications reviewed with the patient and she stated no untoward effects, Librium protocol started along with anti-anxiety medications (hydroxzine.)  4-Coping skills for substance abuse, and anxiety developing--  5-Continue crisis stabilization and management  6-Address health issues--monitoring vital signs, stable  7-Treatment plan in progress to prevent relapse of alcohol abuse and anxiety  8-Psychosocial education regarding relapse prevention and self-care  8-Health care follow up as needed for any health concerns  9-Call for consult with hospitalist for additional specialty patient services as needed.  I certify that inpatient services furnished can reasonably be expected to improve the patient's  condition.  PUTHUVEL, SHAJI 09/02/2013, 12:05 PM

## 2013-09-02 NOTE — [facility] Group Notes (Signed)
[facility] LCSW Group Therapy  09/02/2013 1:28 PM  Type of Therapy:  Group Therapy  Participation Level:  Active  Participation Quality:  Attentive  Affect:  Appropriate  Cognitive:  Alert and Oriented  Insight:  Engaged  Engagement in Therapy:  Engaged  Modes of Intervention:  Discussion, Education, Exploration, Socialization and Support  Summary of Progress/Problems: MHA Speaker came to talk about his personal journey with substance abuse and addiction. The pt processed ways by which to relate to the speaker. MHA speaker provided handouts and educational information pertaining to groups and services offered by the [facility]. Larson was attentive and engaged throughout today's therapy group. He actively listened as speaker talked about his experiences with SA and MI. Kyden shows progress in the group setting and was able to process how substance abuse and MI interact/feed off each other if not managed.    Smart, Heather 09/02/2013, 1:28 PM

## 2013-09-02 NOTE — Progress Notes (Signed)
Recreation Therapy Notes  Date: 10.07.2014 Time: 3:00pm Location: [address] Dayroom  Group Topic: Communication, Team Building, Problem Solving  Goal Area(s) Addresses:  Patient will effectively work with peer towards shared goal.  Patient will identify skill used to make activity successful.  Patient will identify how skills used during activity can be used to reach post d/c goals.   Behavioral Response: Engaged, Redirectable   Intervention: Problem Solving Activitiy  Activity: Life Boat. Patients were given a scenario about being on a sinking yacht. Patients were informed the yacht included 15 guest, 8 of which could be placed on the life boat, along with all group members. Individuals on guest list were of varying socioeconomic classes such as a Education officer, museum, Materials engineer, Midwife, Tree surgeon.   Education: Customer service manager, Discharge Planning   Education Outcome: Acknowledges understanding  Clinical Observations/Feedback: Patient actively engaged in group activity, voicing his opinion and debating with peers appropriately. Patient identified communication as a skill necessary to make activity successful, as well as what guided decision making during group session. As additional skills were identified by peers patient nodded in agreement and gave examples of how those skills can be used post d/c. Patient additionally related team work and communication to building his support system, as well as related problem solving to identifying who he should build relationships with post discharge.   Marykay Lex Blanchfield, LRT/CTRS  Jearl Klinefelter 09/02/2013 4:42 PM

## 2013-09-02 NOTE — Progress Notes (Signed)
Adult Psychoeducational Group Note  Date:  09/02/2013 Time:  1:26 PM  Group Topic/Focus:  Recovery Goals:   The focus of this group is to identify appropriate goals for recovery and establish a plan to achieve them.  Participation Level:  Active  Participation Quality:  Appropriate and Attentive  Affect:  Appropriate  Cognitive:  Alert and Appropriate  Insight: Good  Engagement in Group:  Engaged  Modes of Intervention:  Discussion, Exploration, Socialization and Support  Additional Comments:  Pt came to group and shared that having no recovery plan and certain people were standing between him and recovery. Pt plans on changing this by doing an intensive outpatient program and going to [facility] and developing a new social circle.  Cathlean Cower 09/02/2013, 1:26 PM

## 2013-09-02 NOTE — Progress Notes (Signed)
Recreation Therapy Notes  Date: 10.07.2014 Time: 2:30pm Location: [address] Dayroom  Group Topic: Animal Assisted Activities (AAA)  Behavioral Response: Engaged, Appropriate  Affect: Euthymic  Clinical Observations/Feedback: Dog Team: Zachary & handler. Patient interacted appropriately with peer, dog team, LRT and MHT.   Denise L Blanchfield, LRT/CTRS  Blanchfield, Denise L 09/02/2013 4:22 PM 

## 2013-09-02 NOTE — BHH Counselor (Signed)
Adult Comprehensive Assessment  Patient ID: [name] [name], male   DOB: July 24, 1961, 52 y.o.   MRN: [id number]  Information Source: Information source: Patient  Current Stressors:  Educational / Learning stressors: [employer] graduate Employment / Job issues: employed  Family Relationships: close to mother, father, and sisters. Working on relationship with wife, who he is separated from. Pt has two sons-very close relationship. They live in [place], [place]. Financial / Lack of resources (include bankruptcy): income from employment and private insurance. Housing / Lack of housing: lives in apt in [place], [place]. Physical health (include injuries & life threatening diseases): carpel tunnel syndrome. Social relationships: fair; My friends are supportive of me but they drink, so I have to stay away. My family is my primary support. Substance abuse: 8-10 beers daily for past 3 months due to increasing stress/anxiety. Bereavement / Loss: none identified.   Living/Environment/Situation:  Living Arrangements: Alone Living conditions (as described by patient or guardian): Comfortable, safe, nice area, clean How long has patient lived in current situation?: 8 months  What is atmosphere in current home: Comfortable  Family History:  Marital status: Separated Separated, when?: 3 years What types of issues is patient dealing with in the relationship?: "marital problems." "She is in [place] with the kids. We are working on things." Additional relationship information: n/a  Does patient have children?: Yes How many children?: 2 How is patient's relationship with their children?: 20 and 22 (two sons) "I am very close to my sons. I miss them everyday.   Childhood History:  By whom was/is the patient raised?: Both parents Additional childhood history information: My parents were married as a child. "I never wanted for anything. They were hardworking and were wonderful parents." Description of patient's  relationship with caregiver when they were a child: I was very close to my parents as a child. We are a very close family. Patient's description of current relationship with people who raised him/her: They live close to me. Supportive of treatment and very supportive of me.  Does patient have siblings?: Yes Number of Siblings: 2 Description of patient's current relationship with siblings: One older sister and one younger sister. I'm very close to both siblings. One sister lives in [place] and the other lives in [place].  Did patient suffer any verbal/emotional/physical/sexual abuse as a child?: No Did patient suffer from severe childhood neglect?: No Has patient ever been sexually abused/assaulted/raped as an adolescent or adult?: No Was the patient ever a victim of a crime or a disaster?: No Witnessed domestic violence?: No Has patient been effected by domestic violence as an adult?: No  Education:  Highest grade of school patient has completed: Engineer, agricultural.  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  Patient's job has been impacted by current illness: Yes Describe how patient's job has been impacted: The stress of dealing with my addiction and trying to hide it has impacted my job performance. 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  Has patient ever been in the [employer]?: No Has patient ever served in combat?: No  Financial Resources:   Financial resources: Income from [employer] Does  patient have a representative payee or guardian?: No  Alcohol/Substance  Abuse:   What has been your use of drugs/alcohol within the last 12 months?: Alcohol only "mostly beer." "I have been drinking about 8-10 beers throughout the day over the past 3 months. "Lots of stressors increased my anxiety about 3 months ago, and alcohol was used to cope with this."  If attempted suicide, did drugs/alcohol play a role in this?: No Alcohol/Substance Abuse Treatment Hx: Past Tx, Inpatient If yes, describe treatment: [facility] 2009; [facility] 2013;  Has alcohol/substance abuse ever caused legal problems?: No  Social Support System:   Patient's Community Support System: Fair Describe Community Support System: "mostly family, but good. I also have supportive friends but they drink, so I probably should not be around them from here on out."  Type of faith/religion: Non denominational/[name] How does patient's faith help to cope with current illness?: Prayer/church  Leisure/Recreation:   Leisure and Hobbies: I love working with my hands, wood working, Aeronautical engineer, anything outside.   Strengths/Needs:   What things does the patient do well?: friendly, easy going, helpful, and loving. In what areas does patient struggle / problems for patient: addiction and anxiety.   Discharge Plan:   Does patient have access to transportation?: Yes (car and license) Will patient be returning to same living situation after discharge?: Yes (I'm hoping to either follow up o/p or go to [facility] ) Currently receiving community mental health services: Yes (From Whom) (FSOP before pt got insurance) If no, would patient like referral for services when discharged?: Yes (What county?) [facility] [place]) Does patient have financial barriers related to discharge medications?: No (Forensic scientist)  Summary/Recommendations:    Pt is 52 year old male living in [place], [place] [facility] [place]) who is presenting to [facility] for ETOH detox and mood stabilization (anxiety). 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 Recommendations for pt include: therapeutic milieu, encourage group attendance and participation, librium taper for withdrawals, medication management for mood stabilization, and development of comprehensive mental wellness/sobriety plan. Pt has been scheduled for med management and therapy services through [facility]. He was also given new pt packet to complete prior to first appt by CSW.   Smart, Research scientist (physical sciences). 09/02/2013

## 2013-09-02 NOTE — BHH Suicide Risk Assessment (Signed)
B[facility] INPATIENT: Family/Significant Other Suicide Prevention Education  Suicide Prevention Education:  Education Completed; No one has been identified by the patient as the family member/significant other with whom the patient will be residing, and identified as the person(s) who will aid the patient in the event of a mental health crisis (suicidal ideations/suicide attempt).   Pt did not c/o SI at admission, nor have they endorsed SI during their stay here. SPE not required. SPI pamphlet provided to pt and he was encouraged to share information with support network.   [employer], LCSWA 09/02/2013 12:16 PM

## 2013-09-02 NOTE — Progress Notes (Signed)
The focus of this group is to educate the patient on the purpose and policies of crisis stabilization and provide a format to answer questions about their admission.  The group details unit policies and expectations of patients while admitted. Patient attended and was insightful and engaging.

## 2013-09-03 DIAGNOSIS — F1020 Alcohol dependence, uncomplicated: Secondary | ICD-10-CM

## 2013-09-03 DIAGNOSIS — F411 Generalized anxiety disorder: Secondary | ICD-10-CM

## 2013-09-03 NOTE — Progress Notes (Signed)
D: Patient denies SI/HI and A/V hallucinations; patient reports sleep is well and that he requested medication; reports appetite is improving ; reports energy level is low   A: Monitored q 15 minutes; patient encouraged to attend groups; patient educated about medications; patient given medications per physician orders; patient encouraged to express feelings and/or concerns  R: Patient is appropriate and pleasant; patient's interaction with staff and peers is appropriate; patient is taking medications as prescribed and tolerating medications; patient is attending all groups and engaging

## 2013-09-03 NOTE — B[facility] Group Notes (Signed)
[facility] LCSW Group Therapy  09/03/2013 2:46 PM  Type of Therapy:  Group Therapy  Participation Level:  Active  Participation Quality:  Attentive and Monopolizing  Affect:  Appropriate  Cognitive:  Alert  Insight:  Improving  Engagement in Therapy:  Engaged  Modes of Intervention:  Confrontation, Discussion, Education, Exploration, Socialization and Support  Summary of Progress/Problems: Emotion Regulation: This group focused on both positive and negative emotion identification and allowed group members to process ways to identify feelings, regulate negative emotions, and find healthy ways to manage internal/external emotions. Group members were asked to reflect on a time when their reaction to an emotion led to a negative outcome and explored how alternative responses using emotion regulation would have benefited them. Group members were also asked to discuss a time when emotion regulation was utilized when a negative emotion was experienced. [name] [name] was attentive and engaged throughout today's group. [name] [name] shows progress in the group setting AEB his ability to actively participate in group discussion and respond to gentle redirection when needed. [name] [name] shared that the emotions most difficult to manage for him is "anxiety and panic." [name] [name] was able to describe what these emotions felt like to him both physically and mentally. "I feel like I'm about to explode, like I am having a heart attack." [name] [name] shared a story about when he experienced his first panic attack and took himself to the hospital, thinking that he was having a hear tattack. He reported that in the past, he dealt with anxiety by drinking in order to reduce these feelings. [name] [name] shows improving insight AEB his ability to recognize his problematic thinking and identify heathy coping skills to help regulate his anxiety/panic. "I plan to be compliant with my meds and keep a journal in order to track my anxiety levels as time goes on."    Smart,  Heather 09/03/2013, 2:46 PM

## 2013-09-03 NOTE — BHH Group Notes (Signed)
[facility] LCSW Aftercare Discharge Planning Group Note   09/03/2013 9:06 AM  Participation Quality:  Appropriate   Mood/Affect:  Appropriate  Depression Rating:  2  Anxiety Rating:  5  Thoughts of Suicide:  No Will you contract for safety?   NA  Current AVH:  No  Plan for Discharge/Comments:  Pt is scheduled for therapy and med management at [facility]. He asked CSW and MD to complete FMLA and for CSW to send letter to his work stating that he is still at the hospital. Pt reports no signs of withdrawal but is still feeling anxious and panicky. Pt reports eating well with improving sleep.   Transportation Means: family member  Supports: some family/close relationship with his adult children.   Smart, [employer]

## 2013-09-03 NOTE — Progress Notes (Signed)
[facility] MD Progress Note  09/03/2013 11:43 AM [name] [name]  MRN:  [id number]  Subjective:  [name] [name] reports, "[name]'m here for alcohol detox. [name] drink to control my anxiety problems. [name] have high anxiety from childhood. My pancrease is bad. [name]'m worried about my liver too. [name] still have pain to my stomach. [name] was told it is coming from my pancrease being bad. [name] believe that [name]'m done with alcoholism. [name] used to be on Neurontin, it helped me. [name] have suggested to try Naltrexone for my alcohlism  Diagnosis:   DSM5: Schizophrenia Disorders:  NA Obsessive-Compulsive Disorders:  NA Trauma-Stressor Disorders: Generalized anxiety disorder Substance/Addictive Disorders:  Alcohol dependence Depressive Disorders:  NA  Axis [name]:  Alcohol dependence, Generalized anxiety disorder Axis II: Deferred Axis III:  Past Medical History  Diagnosis Date  . Alcohol abuse   . Sarcoidosis of lung   . Reflux   . Depression   . Anxiety   . Pancreatitis   . Carpal tunnel syndrome of right wrist    Axis IV: other psychosocial or environmental problems and Alcoholism Axis V: 41-50 serious symptoms  ADL's:  Fairly intact  Sleep: Good  Appetite:  Good  Suicidal Ideation:  Plan:  Denies Intent:  Denies Means:  Denies Homicidal Ideation:  Plan:  Denies Intent:  Denies Means:  Denies  AEB (as evidenced by): Per patient's reports.  Psychiatric Specialty Exam: Review of Systems  Constitutional: Negative.   HENT: Negative.   Eyes: Negative.   Respiratory: Negative.   Cardiovascular: Negative.   Gastrointestinal: Positive for abdominal pain.  Genitourinary: Negative.   Skin: Negative.   Neurological: Positive for tremors.  Endo/Heme/Allergies: Negative.   Psychiatric/Behavioral: Positive for substance abuse (Alcoholism). Negative for depression, suicidal ideas, hallucinations and memory loss. The patient is nervous/anxious (Currently being stabilized with medication) and has insomnia (Currently being stabilized with  medication.).     Blood pressure 119/79, pulse 82, temperature 97.6 F (36.4 C), temperature source Oral, resp. rate 16, height 5' 8.9" (1.75 m), weight 61.236 kg (135 lb).Body mass index is 20 kg/(m^2).  General Appearance: Fairly Groomed  Patent attorney::  Good  Speech:  Clear and Coherent and talkative  Volume:  Increased  Mood:  Denies feeling depressed, however, endorsing worthlessness  Affect:  Congruent  Thought Process:  Coherent and Goal Directed  Orientation:  Full (Time, Place, and Person)  Thought Content:  Rumination  Suicidal Thoughts:  No  Homicidal Thoughts:  No  Memory:  Immediate;   Good Recent;   Good Remote;   Good  Judgement:  Fair  Insight:  Fair  Psychomotor Activity:  Restlessness and anxiousness  Concentration:  Fair  Recall:  Good  Akathisia:  No  Handed:  Right  AIMS (if indicated):     Assets:  Communication Skills Desire for Improvement  Sleep:  Number of Hours: 5   Current Medications: Current Facility-Administered Medications  Medication Dose Route Frequency Provider Last Rate Last Dose  . acetaminophen (TYLENOL) tablet 650 mg  650 mg Oral Q6H PRN Verne Spurr, PA-C      . alum & mag hydroxide-simeth (MAALOX/MYLANTA) 200-200-20 MG/5ML suspension 30 mL  30 mL Oral Q4H PRN Verne Spurr, PA-C      . chlordiazePOXIDE (LIBRIUM) capsule 25 mg  25 mg Oral Q6H PRN Verne Spurr, PA-C      . chlordiazePOXIDE (LIBRIUM) capsule 25 mg  25 mg Oral TID Verne Spurr, PA-C   25 mg at 09/03/13 0744   Followed by  . [START ON  09/04/2013] chlordiazePOXIDE (LIBRIUM) capsule 25 mg  25 mg Oral BH-qamhs Verne Spurr, PA-C       Followed by  . [START ON 09/05/2013] chlordiazePOXIDE (LIBRIUM) capsule 25 mg  25 mg Oral Daily Verne Spurr, PA-C      . gabapentin (NEURONTIN) capsule 200 mg  200 mg Oral TID Nanine Means, NP   200 mg at 09/03/13 0742  . hydrOXYzine (ATARAX/VISTARIL) tablet 25 mg  25 mg Oral Q6H PRN Verne Spurr, PA-C   25 mg at 09/01/13 2216  .  loperamide (IMODIUM) capsule 2-4 mg  2-4 mg Oral PRN Verne Spurr, PA-C      . magnesium hydroxide (MILK OF MAGNESIA) suspension 30 mL  30 mL Oral Daily PRN Verne Spurr, PA-C      . multivitamin with minerals tablet 1 tablet  1 tablet Oral Daily Verne Spurr, PA-C   1 tablet at 09/03/13 0742  . ondansetron (ZOFRAN-ODT) disintegrating tablet 4 mg  4 mg Oral Q6H PRN Verne Spurr, PA-C      . thiamine (VITAMIN B-1) tablet 100 mg  100 mg Oral Daily Verne Spurr, PA-C   100 mg at 09/03/13 0742  . traZODone (DESYREL) tablet 100 mg  100 mg Oral QHS Nanine Means, NP   100 mg at 09/02/13 2203    Lab Results: No results found for this or any previous visit (from the past 48 hour(s)).  Physical Findings: AIMS: Facial and Oral Movements Muscles of Facial Expression: None, normal Lips and Perioral Area: None, normal Jaw: None, normal Tongue: None, normal,Extremity Movements Upper (arms, wrists, hands, fingers): None, normal Lower (legs, knees, ankles, toes): None, normal, Trunk Movements Neck, shoulders, hips: None, normal, Overall Severity Severity of abnormal movements (highest score from questions above): None, normal Incapacitation due to abnormal movements: None, normal Patient's awareness of abnormal movements (rate only patient's report): No Awareness, Dental Status Current problems with teeth and/or dentures?: No  CIWA:  CIWA-Ar Total: 0 COWS:     Treatment Plan Summary: Daily contact with patient to assess and evaluate symptoms and progress in treatment Medication management  Plan: Supportive approach/coping skills/relapse prevention. Reviewed lab results with patient. Encouraged out of room, participation in group sessions and application of coping skills when distressed. Will continue to monitor response to/adverse effects of medications in use to assure effectiveness. Continue to monitor mood, behavior and interaction with staff and other patients. Continue current plan of  care.  Medical Decision Making Problem Points:  Review of last therapy session (1) and Review of psycho-social stressors (1) Data Points:  Review of medication regiment & side effects (2) Review of new medications or change in dosage (2)  [name] certify that inpatient services furnished can reasonably be expected to improve the patient's condition.   [name] [name], PMHNP-BC 09/03/2013, 11:43 AM

## 2013-09-03 NOTE — Progress Notes (Signed)
Adult Psychoeducational Group Note  Date:  09/03/2013 Time:  12:45 PM  Group Topic/Focus:  Emotional Education:   The focus of this group is to discuss what feelings/emotions are, and how they are experienced.  Participation Level:  Active  Participation Quality:  Appropriate and Attentive  Affect:  Appropriate  Cognitive:  Appropriate  Insight: Appropriate  Engagement in Group:  Engaged  Modes of Intervention:  Education  Additional Comments:  Pts watched a video "The Sober Life- My Attitude". Pt where encouraged to discuss their feelings,opinions, and what they learned from watching the video.     Tora Perches N 09/03/2013, 12:45 PM

## 2013-09-03 NOTE — Progress Notes (Signed)
AA Group 

## 2013-09-03 NOTE — Tx Team (Signed)
Interdisciplinary Treatment Plan Update (Adult)  Date: 09/03/2013   Time Reviewed: 9:50 AM  Progress in Treatment:  Attending groups: Yes  Participating in groups:  Yes  Taking medication as prescribed: Yes  Tolerating medication: Yes  Family/Significant othe contact made: No. SPE not required for pt.   Patient understands diagnosis: Yes, AEB seeking treatment for ETOH detox, panic attacks, and anxiety.  Discussing patient identified problems/goals with staff: Yes  Medical problems stabilized or resolved: Yes  Denies suicidal/homicidal ideation: Yes during admission, self report, and group.  Patient has not harmed self or Others: Yes  New problem(s) identified: n/a  Discharge Plan or Barriers: Pt will follow up with [facility] for med management and therapy. Appts scheduled for both.  Additional comments: Tomoki presented to [facility] with request for Alcohol Detox as he is drinking daily and feels he cannot stop on his own. Patient relapsed in November of 2013 after being sober for 4-5 months and following up for med management (for anxiety) and therapy at [facility]. Patient reports he went off medications on October 2013 and began using alcohol to self medicate and is currently drinking six-seven 24 oz to 40 oz beers daily. Patient reports he is aware of connection between his drinking and pancreas issues and inability to focus on other basic needs pt has. Patient also aware that his alcohol use creates concern for family members including his parents, sisters and sons. Patient requesting detox as "I can't stop on my own without being away from it." Patient reports he has had treatment before but does not want to give up on himself.  Patient has full time job in Landscape architect at [employer] and reports he has informed them he needed time away to get help.   Reason for Continuation of Hospitalization: Librium taper-withdrawals Mood  stabilization Medication management  Estimated length of stay: 1-3 days  For review of initial/current patient goals, please see plan of care.  Attendees:  Patient:    Family:    Physician: Dr. Daleen Bo MD 09/03/2013 9:49 AM   Nursing: Inetta Fermo RN 09/03/2013 9:49 AM   Clinical Social Worker Heather Smart, LCSWA  09/03/2013 9:49 AM   Other: Aggie PA   09/03/2013 9:49 AM   Other: Philippa Chester RN 09/03/2013 9:50 AM   Other: Darden Dates Nurse CM   09/03/2013 9:49 AM   Other:    Scribe for Treatment Team:  [employer] LCSWA 09/03/2013 9:50 AM

## 2013-09-04 MED ORDER — TRIAMCINOLONE ACETONIDE 0.025 % EX CREA
TOPICAL_CREAM | Freq: Two times a day (BID) | CUTANEOUS | Status: DC
  Filled 2013-09-04: qty 15
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
## 2013-09-04 NOTE — Progress Notes (Signed)
Recreation Therapy Notes  Date: 10.09.2014 Time: 2:30pm Location: [address]   Group Topic: Software engineer Activities (AAA)  Behavioral Response: Did not attend  [employer], LRT/CTRS  Jearl Klinefelter 09/04/2013 4:37 PM

## 2013-09-04 NOTE — Progress Notes (Signed)
Patient ID: [name] [name], male   DOB: 1961-03-19, 52 y.o.   MRN: [id number] [facility] MD Progress Note  09/04/2013 3:20 PM [name] [name]  MRN:  [id number]  Subjective:  [name] [name] reports, "I believe I'm starting to feel better. This is also how I know that I'm getting ready for the next step. I want to get better, I need to get better. My appointment with the [facility] is on the 15th of this month. I will start with that first. Then, as it progresses and if I see that it is not meeting my needs, I will then look into may be going to a residential treatment center. I have Eczema. It is starting to flare up again. I used a cream for it in the past".  Diagnosis:   DSM5: Schizophrenia Disorders:  NA Obsessive-Compulsive Disorders:  NA Trauma-Stressor Disorders: Generalized anxiety disorder Substance/Addictive Disorders:  Alcohol dependence Depressive Disorders:  NA  Axis I:  Alcohol dependence, Generalized anxiety disorder Axis II: Deferred Axis III:  Past Medical History  Diagnosis Date  . Alcohol abuse   . Sarcoidosis of lung   . Reflux   . Depression   . Anxiety   . Pancreatitis   . Carpal tunnel syndrome of right wrist    Axis IV: other psychosocial or environmental problems and Alcoholism Axis V: 41-50 serious symptoms  ADL's:  Fairly intact  Sleep: Good  Appetite:  Good  Suicidal Ideation:  Plan:  Denies Intent:  Denies Means:  Denies Homicidal Ideation:  Plan:  Denies Intent:  Denies Means:  Denies  AEB (as evidenced by): Per patient's reports.  Psychiatric Specialty Exam: Review of Systems  Constitutional: Negative.   HENT: Negative.   Eyes: Negative.   Respiratory: Negative.   Cardiovascular: Negative.   Gastrointestinal: Positive for abdominal pain.  Genitourinary: Negative.   Skin: Negative.   Neurological: Positive for tremors.  Endo/Heme/Allergies: Negative.   Psychiatric/Behavioral: Positive for substance abuse (Alcoholism). Negative for depression, suicidal ideas,  hallucinations and memory loss. The patient is nervous/anxious (Currently being stabilized with medication) and has insomnia (Currently being stabilized with medication.).     Blood pressure 127/85, pulse 85, temperature 97.7 F (36.5 C), temperature source Oral, resp. rate 20, height 5' 8.9" (1.75 m), weight 61.236 kg (135 lb).Body mass index is 20 kg/(m^2).  General Appearance: Fairly Groomed  Patent attorney::  Good  Speech:  Clear and Coherent and talkative  Volume:  Increased  Mood:  Denies feeling depressed, however, endorsing worthlessness  Affect:  Congruent  Thought Process:  Coherent and Goal Directed  Orientation:  Full (Time, Place, and Person)  Thought Content:  Rumination  Suicidal Thoughts:  No  Homicidal Thoughts:  No  Memory:  Immediate;   Good Recent;   Good Remote;   Good  Judgement:  Fair  Insight:  Fair  Psychomotor Activity:  Restlessness and anxiousness  Concentration:  Fair  Recall:  Good  Akathisia:  No  Handed:  Right  AIMS (if indicated):     Assets:  Communication Skills Desire for Improvement  Sleep:  Number of Hours: 5.75   Current Medications: Current Facility-Administered Medications  Medication Dose Route Frequency Provider Last Rate Last Dose  . acetaminophen (TYLENOL) tablet 650 mg  650 mg Oral Q6H PRN Verne Spurr, PA-C      . alum & mag hydroxide-simeth (MAALOX/MYLANTA) 200-200-20 MG/5ML suspension 30 mL  30 mL Oral Q4H PRN Verne Spurr, PA-C      . chlordiazePOXIDE (LIBRIUM) capsule 25 mg  25  mg Oral Q6H PRN Verne Spurr, PA-C      . chlordiazePOXIDE (LIBRIUM) capsule 25 mg  25 mg Oral BH-qamhs Neil Mashburn, PA-C   25 mg at 09/04/13 0751   Followed by  . [START ON 09/05/2013] chlordiazePOXIDE (LIBRIUM) capsule 25 mg  25 mg Oral Daily Verne Spurr, PA-C      . gabapentin (NEURONTIN) capsule 200 mg  200 mg Oral TID Nanine Means, NP   200 mg at 09/04/13 1147  . hydrOXYzine (ATARAX/VISTARIL) tablet 25 mg  25 mg Oral Q6H PRN Verne Spurr,  PA-C   25 mg at 09/01/13 2216  . loperamide (IMODIUM) capsule 2-4 mg  2-4 mg Oral PRN Verne Spurr, PA-C      . magnesium hydroxide (MILK OF MAGNESIA) suspension 30 mL  30 mL Oral Daily PRN Verne Spurr, PA-C      . multivitamin with minerals tablet 1 tablet  1 tablet Oral Daily Verne Spurr, PA-C   1 tablet at 09/04/13 0750  . ondansetron (ZOFRAN-ODT) disintegrating tablet 4 mg  4 mg Oral Q6H PRN Verne Spurr, PA-C      . thiamine (VITAMIN B-1) tablet 100 mg  100 mg Oral Daily Verne Spurr, PA-C   100 mg at 09/04/13 0751  . traZODone (DESYREL) tablet 100 mg  100 mg Oral QHS Nanine Means, NP   100 mg at 09/03/13 2216    Lab Results: No results found for this or any previous visit (from the past 48 hour(s)).  Physical Findings: AIMS: Facial and Oral Movements Muscles of Facial Expression: None, normal Lips and Perioral Area: None, normal Jaw: None, normal Tongue: None, normal,Extremity Movements Upper (arms, wrists, hands, fingers): None, normal Lower (legs, knees, ankles, toes): None, normal, Trunk Movements Neck, shoulders, hips: None, normal, Overall Severity Severity of abnormal movements (highest score from questions above): None, normal Incapacitation due to abnormal movements: None, normal Patient's awareness of abnormal movements (rate only patient's report): No Awareness, Dental Status Current problems with teeth and/or dentures?: No  CIWA:  CIWA-Ar Total: 0 COWS:     Treatment Plan Summary: Daily contact with patient to assess and evaluate symptoms and progress in treatment Medication management  Plan: Supportive approach/coping skills/relapse prevention. Triamcinolone 0.025% bid to affected areas for Eczema. Reviewed lab results with patient. Encouraged out of room, participation in group sessions and application of coping skills when distressed. Will continue to monitor response to/adverse effects of medications in use to assure effectiveness. Continue to monitor  mood, behavior and interaction with staff and other patients. Continue current plan of care.  Medical Decision Making Problem Points:  Review of last therapy session (1) and Review of psycho-social stressors (1) Data Points:  Review of medication regiment & side effects (2) Review of new medications or change in dosage (2)  I certify that inpatient services furnished can reasonably be expected to improve the patient's condition.   Armandina Stammer I, PMHNP-BC 09/04/2013, 3:20 PM

## 2013-09-04 NOTE — Progress Notes (Signed)
Adult Psychoeducational Group Note  Date:  09/04/2013 Time:  1:20 PM  Group Topic/Focus:  Building Self Esteem:   The Focus of this group is helping patients become aware of the effects of self-esteem on their lives, the things they and others do that enhance or undermine their self-esteem, seeing the relationship between their level of self-esteem and the choices they make and learning ways to enhance self-esteem.  Participation Level:  Active  Participation Quality:  Appropriate and Attentive  Affect:  Appropriate  Cognitive:  Alert and Appropriate  Insight: Good  Engagement in Group:  Engaged  Modes of Intervention:  Activity, Discussion, Exploration, Socialization and Support  Additional Comments:  Pt came to group and shared that alcohol and potential job loss are effecting his self-esteem. Pt plans on changing this by going to an intensive outpatient program, AA meetings, and medications. Pt shared that he was open about his illness with his job and they are supportive of him.   [name] [name] 09/04/2013, 1:20 PM

## 2013-09-04 NOTE — Progress Notes (Signed)
Patient ID: [name] [name], male   DOB: 11/12/1961, 52 y.o.   MRN: [id number] D: Patient in dayroom on approach. Pt mood/affect appeared anxious but appropriate to circumstance. Pt stated he is "doing well". Pt denies any withdrawal symptoms. Pt denies SI/HI/AVH and pain. Pt attended evening wrap up group and engaged in discussion. Pt denies any needs or concerns.  Cooperative with assessment. No acute distressed noted at this time.   A: Met with pt 1:1. Medications administered as prescribed. Writer encouraged pt to discuss feelings. Pt encouraged to come to staff with any question or concerns.   R: Patient remains safe. He is complaint with medications and denies any adverse reaction. Continue current POC.

## 2013-09-04 NOTE — Progress Notes (Signed)
The focus of this group is to educate the patient on the purpose and policies of crisis stabilization and provide a format to answer questions about their admission.  The group details unit policies and expectations of patients while admitted. Patient attended the group and was very engaging and insightful.

## 2013-09-04 NOTE — Progress Notes (Signed)
Recreation Therapy Notes  Date: 10.09.2014 Time: 3:00pm Location: [address] Dayroom   Group Topic: Communication, Team Building, Problem Solving  Goal Area(s) Addresses:  Patient will effectively work with peer towards shared goal.  Patient will identify skill used to make activity successful.  Patient will identify how skills used during activity can be used to reach post d/c goals.   Behavioral Response: Engaged, Attentive, Appropriate  Intervention: Problem Solving Activity  Activity: Landing Pad. In teams patients were given 12 plastic drinking straws and a length of masking tape. Using the materials provided patients were asked to build a landing pad to catch a golf ball dropped from approximately 4 feet in the air.   Education: Discharge Planning  Education Outcome: Acknowledges Understanding  Clinical Observations/Feedback: Patient actively engaged in group activity. Patient shared ideas and opinions with team mates, as well as assisted with building team landing pad. Patient identified team work and communication as skills necessary to make activity successful. Patient related theses skills to working a program and asking for help when necessary post d/c.    Marykay Lex Blanchfield, LRT/CTRS        Blanchfield, Denise L 09/04/2013 4:55 PM

## 2013-09-04 NOTE — Progress Notes (Signed)
Pt reports he is feeling better this evening and his withdrawal symptoms are minimal.  He denies SI/HI/AV.  He has been attending groups.  He again tells this Clinical research associate how appreciative he is of the care he has received.  Pt makes his needs known to staff.  He plans to go for alcohol rehab.  Support and encouragement offered.  Safety maintained with q15 minute checks.

## 2013-09-04 NOTE — Progress Notes (Signed)
D: Patient denies SI/HI and A/V hallucinations; patient reports sleep is fair; reports appetite is improving; reports energy level is low ; reports ability to pay attention is good; rates depression as 0/10; rates hopelessness 0/10; rates anxiety as 0/10;   A: Monitored q 15 minutes; patient encouraged to attend groups; patient educated about medications; patient given medications per physician orders; patient encouraged to express feelings and/or concerns  R: Patient is appropriate and cooperative; patient is very engaging; patient is invested and insight; patient's interaction with staff and peers is approrpriate;  patient is taking medications as prescribed and tolerating medications; patient is attending all groups

## 2013-09-05 MED ORDER — GABAPENTIN 100 MG PO CAPS: 200.0000 mg | ORAL_CAPSULE | Freq: Three times a day (TID) | ORAL | Status: DC

## 2013-09-05 MED ORDER — TRAZODONE HCL 100 MG PO TABS: 100.0000 mg | ORAL_TABLET | Freq: Every day | ORAL | Status: DC

## 2013-09-05 NOTE — Progress Notes (Signed)
[facility] Adult Case Management Discharge Plan :  Will you be returning to the same living situation after discharge: Yes,  home At discharge, do you have transportation home?:Yes,  pt's sister. Do you have the ability to pay for your medications:Yes,  BCBS  Release of information consent forms completed and in the chart;  Patient's signature needed at discharge.  Patient to Follow up at: Follow-up Information   Follow up with [facility] On 09/24/2013. (Appt. with Dr. Donell Beers at 3:45 for medication management. )    Contact information:   [address] [place], [place] [address] Phone: [phone] Fax: [phone]      Follow up with [facility] On 09/10/2013. (Appt. at 8AM with Boneta Lucks for therapy. )    Contact information:   [address] [place], [place] [address] Phone: [phone] Fax: [phone]      Patient denies SI/HI:   Yes,  during admission, group, and self report.     Safety Planning and Suicide Prevention discussed:  Yes,  SPE not required for pt. SPI pamphlet provided to pt and he was encouraged to share with his support network.   Smart, Heather 09/05/2013, 10:25 AM

## 2013-09-05 NOTE — [facility] Group Notes (Signed)
[facility] LCSW Group Therapy  09/05/2013 2:08 PM  Type of Therapy:  Group Therapy  Participation Level:  Active  Participation Quality:  Attentive  Affect:  Appropriate  Cognitive:  Alert and Oriented  Insight:  Engaged  Engagement in Therapy:  Engaged  Modes of Intervention:  Confrontation, Discussion, Education, Socialization and Support  Summary of Progress/Problems: Feelings around Relapse. Group members discussed the meaning of relapse and shared personal stories of relapse, how it affected them and others, and how they perceived themselves during this time. Group members were encouraged to identify triggers, warning signs and coping skills used when facing the possibility of relapse. Social supports were discussed and explored in detail. Post Acute Withdrawal Syndrome (handout provided) was introduced and examined. Pt's were encouraged to ask questions, talk about key points associated with PAWS, and process this information in terms of relapse prevention. [name] [name] was attentive and engaged throughout today's group. He shows progress in the group setting AEB his ability to actively participate in group discussion, process today's topic, and provide support/encourement to other group members. [name] [name] shared that to him, relapse meant "starting a bad behavior after a period of doing well." [name] [name] stated that he felt his anxiety and "panic issues" triggers his negative behavior of drinking. [name] [name] believes that if his anxiety could be controlled, the need to drink would diminish. [name] [name] was provided with information about PAWS. The group discussed this information. [name] [name] explained that he would be sharing this information with family and supports in order to create a safety plan for his "bad days." [name] [name] was ability to acknowledge the importance of having supports and having the knowledge about PAWS in order to get through the next two years of recovery.    Smart, Heather 09/05/2013, 2:08 PM

## 2013-09-05 NOTE — [facility] Suicide Risk Assessment (Signed)
Suicide Risk Assessment  Discharge Assessment     Demographic Factors:  Male and separated  Mental Status Per Nursing Assessment::   On Admission:  NA  Current Mental Status by Physician: GA: AAOx3, pleasant, fair e/c, sp: RRR, mood: "good", affect: euthymic, full range, TP: gd, TC: Pt denies SI/HI/AVH, I/J: fair  Loss Factors: Financial problems/change in socioeconomic status  Historical Factors: Impulsivity  Risk Reduction Factors:   Sense of responsibility to family, Employed, Living with another person, especially a relative and Positive social support  Continued Clinical Symptoms:  Alcohol/Substance Abuse/Dependencies  Cognitive Features That Contribute To Risk:  Thought constriction (tunnel vision)    Suicide Risk:  Minimal: No identifiable suicidal ideation.  Patients presenting with no risk factors but with morbid ruminations; may be classified as minimal risk based on the severity of the depressive symptoms  Discharge Diagnoses:   AXIS I:  Generalized Anxiety Disorder and Substance Abuse AXIS II:  Deferred AXIS III:   Past Medical History  Diagnosis Date  . Alcohol abuse   . Sarcoidosis of lung   . Reflux   . Depression   . Anxiety   . Pancreatitis   . Carpal tunnel syndrome of right wrist    AXIS IV:  other psychosocial or environmental problems AXIS V:  61-70 mild symptoms  Plan Of Care/Follow-up recommendations:  Other:  Pt will live with his parents upon discharge. He is motivated to attend AA, and start outpatient mental health services as [facility].   Is patient on multiple antipsychotic therapies at discharge:  No   Has Patient had three or more failed trials of antipsychotic monotherapy by history:  No  Recommended Plan for Multiple Antipsychotic Therapies: NA  SARANGA, VINAY 09/05/2013, 10:52 AM

## 2013-09-05 NOTE — Progress Notes (Signed)
Patient ID: [name] [name], male   DOB: March 26, 1961, 52 y.o.   MRN: [id number]  Patient discharged per physician orders; patient denies SI/HI and A/V hallucinations; patient received samples, prescriptions, and copy of AVS after it was reviewed; patient had no other questions or concerns; patient verbalized and signed that he received all belongings; patient left the unit ambulatory

## 2013-09-05 NOTE — Progress Notes (Signed)
[facility] Group Notes:  (Nursing/MHT/Case Management/Adjunct)  Date:  09/04/2013  Time:  2100 Type of Therapy:  wrap up group  Participation Level:  Active  Participation Quality:  Attentive, Monopolizing, Redirectable, Sharing and Supportive  Affect:  Appropriate  Cognitive:  Alert and Appropriate  Insight:  Good  Engagement in Group:  Engaged  Modes of Intervention:  Clarification, Education and Support  Summary of Progress/Problems:  [name] [name] 09/05/2013, 12:28 AM

## 2013-09-05 NOTE — Progress Notes (Signed)
Adult Psychoeducational Group Note  Date:  09/05/2013 Time:  1:37 PM  Group Topic/Focus:  Relapse Prevention Planning:   The focus of this group is to define relapse and discuss the need for planning to combat relapse.  Participation Level:  Active  Participation Quality:  Sharing and Supportive  Affect:  Appropriate  Cognitive:  Appropriate  Insight: Appropriate  Engagement in Group:  Engaged and Supportive  Modes of Intervention:  Discussion  Additional Comments:  Pts where encouraged to discuss what they could do if they felt themselves slipping back into old habits. Each pt was given a [facility] journal to write their goals down in.   Tora Perches N 09/05/2013, 1:37 PM

## 2013-09-05 NOTE — [facility] Group Notes (Signed)
[facility] LCSW Aftercare Discharge Planning Group Note   09/05/2013 9:06 AM  Participation Quality:  Appropriate   Mood/Affect:  Appropriate  Depression Rating:  0  Anxiety Rating:  2  Thoughts of Suicide:  No Will you contract for safety?   NA  Current AVH:  No  Plan for Discharge/Comments:  Pt stated that his medications have been "working wonderfully" to ease his anxiety without causing drowsiness. Pt feels that he is ready to d/c today and is excited to go to family reunion tomorrow. Pt has FMLA paperwork that must be completed and faxed today prior to pt d/c.   Transportation Means: pt's sister  Supports: pt's sister/family members  Smart, Research scientist (physical sciences)

## 2013-09-05 NOTE — Tx Team (Signed)
Interdisciplinary Treatment Plan Update (Adult)  Date: 09/05/2013   Time Reviewed: 10:22 AM  Progress in Treatment:  Attending groups: Yes  Participating in groups:  Yes  Taking medication as prescribed: Yes  Tolerating medication: Yes  Family/Significant othe contact made: No. SPE not required for pt.   Patient understands diagnosis: Yes, AEB seeking treatment for ETOH detox, panic attacks, and anxiety.  Discussing patient identified problems/goals with staff: Yes  Medical problems stabilized or resolved: Yes  Denies suicidal/homicidal ideation: Yes during admission, self report, and group.  Patient has not harmed self or Others: Yes  New problem(s) identified: n/a  Discharge Plan or Barriers: Pt will follow up with [facility] for med management and therapy. Appts scheduled for both. FMLA completed by MD and CSW and faxed to employer per pt request. Pt also reviewed FMLA paperwork prior to this being sent.  Additional comments:Reason for Continuation of Hospitalization: d/c today Estimated length of stay: d/c today For review of initial/current patient goals, please see plan of care.  Attendees:  Patient:    Family:    Physician: Dr. Tawni Carnes MD  09/05/2013 10:22 AM   Nursing:  Mardella Layman RN 09/05/2013 10:22 AM   Clinical Social Worker Heather Smart, LCSWA  09/05/2013 10:22 AM   Other:    09/05/2013 10:22 AM   Other: Philippa Chester RN 09/05/2013 10:22 AM   Other: Darden Dates Nurse CM   09/05/2013 10:22 AM   Other:    Scribe for Treatment Team:  [employer] LCSWA 09/05/2013 10:22 AM

## 2013-09-09 NOTE — Discharge Summary (Signed)
Physician Discharge Summary Note  Patient:  [name] [name] is an 52 y.o., male MRN:  [id number] DOB:  1961-01-29 Patient phone:  [phone] (home)  Patient address:   [address] [place] [address],   Date of Admission:  09/01/2013 Date of Discharge: 09/05/2013 Reason for Admission:  Alcohol dependence  Discharge Diagnoses: Principal Problem:   Alcohol withdrawal Active Problems:   Alcohol dependence   Generalized anxiety disorder  ROS  DSM5: DSM5:  Substance/Addictive Disorders: Alcohol Related Disorder - Severe (303.90)  AXIS I: Alcohol Abuse, Generalized Anxiety Disorder and Panic Disorder  AXIS II: Deferred  AXIS III:  Past Medical History   Diagnosis  Date   .  Alcohol abuse    .  Sarcoidosis of lung    .  Reflux    .  Depression    .  Anxiety    .  Pancreatitis    .  Carpal tunnel syndrome of right wrist     AXIS IV: occupational problems, other psychosocial or environmental problems, problems related to social environment and problems with primary support group  AXIS V: 41-50 serious symptoms  Level of Care:  OP  Hospital Course:       [name] presented to [facility] with request for Alcohol Detox as he is drinking daily and feels he cannot stop on his own. Patient relapsed in November of 2013 after being sober for 4-5 months and following up for med management (for anxiety) and therapy at [facility]. Patient reports he went off medications on October 2013 and began using alcohol to self medicate and is currently drinking six-seven 24 oz to 40 oz beers daily. His blood alcohol level was 277 on arrival at the ED. UDS was negative. His Lipase was elevated at  207, but ALT/AST were normal.     [name] was admitted for alcohol dependence and crisis management.  He was treated with the standard Librium protocol for detox.  Medical problems were identified and treated.  Home medications was restarted as appropriate.   Improvement was monitored by CIWA scores and patient's daily report of withdrawal symptom reduction.  Emotional and mental status was monitored by daily self inventory reports completed by the patient and clinical staff.        The patient was evaluated by the treatment team for continued recovery upon discharge. [name] was offered further treatment options upon discharge including Residential, Intensive Outpatient and Outpatient treatment.  The patient's motivation was an integral factor for scheduling further treatment.  Employment, transportation, bed availability, health status, family support, and any pending legal issues were also considered.  Upon completion of detox the patient was both and mentally and medically stable for discharge.   Consults:  None  Significant Diagnostic Studies:  labs: CBC, CMP, UA, UDS, BAL  Discharge Vitals:   Blood pressure 114/76, pulse 90, temperature 97.7 F (36.5 C), temperature source Oral, resp. rate 16, height 5' 8.9" (1.75 m), weight 61.236 kg (135 lb). Body mass index is 20 kg/(m^2). Lab Results:   No results found for this or any previous visit (from the past 72 hour(s)).  Physical Findings: AIMS: Facial and Oral Movements Muscles of Facial Expression: None, normal Lips and Perioral Area: None, normal Jaw: None, normal Tongue: None, normal,Extremity Movements Upper (arms, wrists, hands, fingers): None, normal Lower (legs, knees, ankles, toes): None, normal, Trunk Movements Neck, shoulders, hips: None, normal, Overall Severity Severity of abnormal movements (highest score from questions above): None, normal  Incapacitation due to abnormal movements: None, normal Patient's awareness of abnormal movements (rate only patient's report): No Awareness, Dental Status Current problems with teeth and/or dentures?: No  CIWA:  CIWA-Ar Total: 0 COWS:     Psychiatric Specialty Exam: See Psychiatric Specialty Exam and Suicide Risk Assessment completed by  Attending Physician prior to discharge.  Discharge destination:  Home  Is patient on multiple antipsychotic therapies at discharge:  No   Has Patient had three or more failed trials of antipsychotic monotherapy by history:  No  Recommended Plan for Multiple Antipsychotic Therapies: NA  Discharge Orders   Future Orders Complete By Expires   Diet - low sodium heart healthy  As directed    Discharge instructions  As directed    Comments:     Take all of your medications as directed. Be sure to keep all of your follow up appointments.  If you are unable to keep your follow up appointment, call your Doctor's office to let them know, and reschedule.  Make sure that you have enough medication to last until your appointment. Be sure to get plenty of rest. Going to bed at the same time each night will help. Try to avoid sleeping during the day.  Increase your activity as tolerated. Regular exercise will help you to sleep better and improve your mental health. Eating a heart healthy diet is recommended. Try to avoid salty or fried foods. Be sure to avoid all alcohol and illegal drugs.   Increase activity slowly  As directed        Medication List       Indication   gabapentin 100 MG capsule  Commonly known as:  NEURONTIN  Take 2 capsules (200 mg total) by mouth 3 (three) times daily. For mood stabilization and neuropathic pain.   Indication:  Alcohol Withdrawal Syndrome, Neuropathic Pain     traZODone 100 MG tablet  Commonly known as:  DESYREL  Take 1 tablet (100 mg total) by mouth at bedtime. For insomnia.   Indication:  Trouble Sleeping           Follow-up Information   Follow up with [facility] On 09/24/2013. (Appt. with Dr. Donell Beers at 3:45 for medication management. )    Contact information:   [address] [place], [place] [address] Phone: [phone] Fax: [phone]      Follow up with [facility] On 09/10/2013. (Appt. at 8AM with Boneta Lucks for therapy. )    Contact information:   [address] [place], [place] [address] Phone: [phone] Fax: [phone]      Follow-up recommendations:   Activities: Resume activity as tolerated. Diet: Heart healthy low sodium diet Tests: Follow up testing will be determined by your out patient provider.  Comments:  Pt agrees to adhere to meds, and follow up as recommended.    Total Discharge Time:  Greater than 30 minutes.  Signed: MASHBURN,NEIL 09/09/2013, 12:35 AM  Ancil Linsey, MD

## 2013-09-10 ENCOUNTER — Ambulatory Visit (HOSPITAL_COMMUNITY): Payer: Self-pay | Admitting: Psychiatry

## 2013-09-10 NOTE — Progress Notes (Signed)
Patient Discharge Instructions:  Next Level Care Provider Has Access to the EMR, 09/10/13 Records provided to [facility] via [facility]/Epic access.  Jerelene Redden, 09/10/2013, 1:11 PM

## 2013-09-11 ENCOUNTER — Encounter (HOSPITAL_COMMUNITY): Payer: Self-pay

## 2013-09-11 ENCOUNTER — Ambulatory Visit (INDEPENDENT_AMBULATORY_CARE_PROVIDER_SITE_OTHER): Payer: BC Managed Care – PPO | Admitting: Psychiatry

## 2013-09-11 ENCOUNTER — Encounter (HOSPITAL_COMMUNITY): Payer: Self-pay | Admitting: Psychiatry

## 2013-09-11 ENCOUNTER — Ambulatory Visit (HOSPITAL_COMMUNITY): Payer: Self-pay | Admitting: Psychiatry

## 2013-09-11 DIAGNOSIS — F1910 Other psychoactive substance abuse, uncomplicated: Secondary | ICD-10-CM

## 2013-09-11 DIAGNOSIS — F1020 Alcohol dependence, uncomplicated: Secondary | ICD-10-CM

## 2013-09-11 NOTE — Progress Notes (Signed)
Patient ID: [name] [name], male   DOB: 05/21/1961, 52 y.o.   MRN: [id number] Presenting Problem Chief Complaint: alcohol dependence, anxiety  What are the main stressors in your life right now, how long? 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 Yes    Are you currently seeing a therapist or counselor, counselor's name? No   Have you ever had a mental health hospitalization, how many times, length of stay? Yes   Have you ever been treated with medication, name, reason, response? Yes. Currently taking neurontin and trazedone   Have you ever had suicidal thoughts or attempted suicide, when, how? No  Risk factors for Suicide Demographic factors:  Living alone Current mental status: none reported Loss factors:none reported Historical factors: none reported Risk Reduction factors: Employed Clinical factors:  anxiety Cognitive features that contribute to risk: none observed  SUICIDE RISK:  Minimal: No identifiable suicidal ideation.  Patients presenting with no risk factors but with morbid ruminations; may be classified as minimal risk based on the severity of the depressive symptoms  Medical history Medical treatment and/or problems, explain: carpal tunnel Do you have any issues with chronic pain?  No   Social/family history Have you been married, how many times?  once  Do you have children?  yes   Who lives in your current household? Lives alone  Military history: No   Religious/spiritual involvement:  What religion/faith base are you? [name]  Family of origin (childhood history)  Where were you born? [facility] Where did you grow up? [facility]  Describe the atmosphere of the household where you grew up: supportive; worked on tobacco farm Do you have siblings, step/half siblings, list names, relation, sex, age? Yes   Are your parents  separated/divorced, when and why? No   Are your parents alive? Yes   Social supports (personal and professional): parents  Education How many grades have you completed? some college Did you have any problems in school, what type? Yes. Learning disability  Medications prescribed for these problems? No   Employment (financial issues) Microbiologist history DUI  Trauma/Abuse history: Have you ever been exposed to any form of abuse, what type? deferred  Have you ever been exposed to something traumatic, describe? deferred  Substance use Pt. Reports that he is currently drinking 12 pack of beer daily.  Mental Status: General Appearance [name] [name]:  Casual Eye Contact:  Good Motor Behavior:  Normal Speech:  Normal Level of Consciousness:  Alert Mood:  Euthymic Affect:  Appropriate Anxiety Level:  minimal Thought Process:  Coherent Thought Content:  WNL Perception:  Normal Judgment:  Good Insight:  Present Cognition: wnl  Diagnosis AXIS I Substance Abuse  AXIS II No diagnosis  AXIS III Past Medical History  Diagnosis Date  . Alcohol abuse   . Sarcoidosis of lung   . Reflux   . Depression   . Anxiety   . Pancreatitis   . Carpal tunnel syndrome of right wrist     AXIS IV occupational problems and other psychosocial or environmental problems  AXIS V 51-60 moderate symptoms   Plan: Pt. Was discharged from inpatient on 10/10 and relapsed after discharge. I referred Pt. To Ann for intake for CDIOP.  _________________________________________         Boneta Lucks, Ph.D., NCC, [facility]

## 2013-09-24 ENCOUNTER — Ambulatory Visit (HOSPITAL_COMMUNITY): Payer: Self-pay | Admitting: Psychiatry

## 2013-10-13 ENCOUNTER — Encounter (HOSPITAL_COMMUNITY): Payer: Self-pay

## 2013-10-14 ENCOUNTER — Encounter (HOSPITAL_COMMUNITY): Payer: Self-pay | Admitting: Psychology

## 2013-10-15 ENCOUNTER — Other Ambulatory Visit (HOSPITAL_COMMUNITY): Payer: BC Managed Care – PPO | Attending: Psychiatry | Admitting: Psychology

## 2013-10-15 DIAGNOSIS — F10939 Alcohol use, unspecified with withdrawal, unspecified: Secondary | ICD-10-CM | POA: Insufficient documentation

## 2013-10-15 DIAGNOSIS — F1020 Alcohol dependence, uncomplicated: Secondary | ICD-10-CM | POA: Insufficient documentation

## 2013-10-15 DIAGNOSIS — F10239 Alcohol dependence with withdrawal, unspecified: Secondary | ICD-10-CM | POA: Insufficient documentation

## 2013-10-15 DIAGNOSIS — F411 Generalized anxiety disorder: Secondary | ICD-10-CM

## 2013-10-15 NOTE — Progress Notes (Unsigned)
Patient ID: [name] [name], male   DOB: 07-27-1961, 52 y.o.   MRN: [id number] CD-IOP: Orientation. The patient is a 52 yo separated, African-American, male seeking continued treatment for his alcohol dependence. The patient was discharged yesterday from the [facility] [place], [place] after completing 28 days of residential treatment. The patient lives with his parents in [place]. The patient is the middle of 3 children born and raised here in [place]. There is an extensive history of addiction with both paternal and maternal grandfathers suffering from alcoholism. There are also numerous uncles and aunts on both sides of the family who suffered from alcoholism. The patient's parents do not drink and he is the only member of his immediate family who is chemically dependent. His two sisters use alcohol sparingly. The patient struggled with an anxiety disorder when he was a child. He also had a problem stuttering. At the age of 40, he drank alcohol for the first time and discovered that his anxiety was reduced significantly. He has been drinking ever since then. He also struggled with writing and noted that although he was offered a full scholarship at [employer], he dropped out because he was ashamed and stressed out over his inability to write correctly. This continues to be a source of anxiety and shame for the patient. He has had previous inpatient treatment, including [facility], [facility], and [facility], but he admitted he had never been serious addressing his addiction until this most recent treatment episode. The patient reported that his family and sisters are very supportive of his recovery. In the past year, the patient moved back from [place], [place] after separating from his wife. He denied that his drinking was the only problem in the marriage and stated that financial issues also played a large part in the separation. He has two sons, ages 55 and 34, who live with their mother. As a temp, the patient secured  a job he really enjoyed and was invited to join the company. 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 The new position caused nerve and carpal tunnel damage to his right hand. The patient has sought medical care and after numerous injections and other treatments, surgery is now an option. He admitted his drinking increased after getting the new position because he was dreading going to work and continuing to damage his right arm.  The patient is prescribed Campral, Neurontin, and Trazadone. The patient's physical health is excellent. The documentation was reviewed, signed and the orientation completed accordingly. He was pleasant and very cooperative throughout this orientation. The patient will return tomorrow at 1 pm and begin the CD-IOP.

## 2013-10-16 ENCOUNTER — Encounter (HOSPITAL_COMMUNITY): Payer: Self-pay | Admitting: Psychology

## 2013-10-16 NOTE — Progress Notes (Signed)
    Daily Group Progress Note  Program: CD-IOP   Group Time: 1-2:30 pm  Participation Level: Active  Behavioral Response: Appropriate and Sharing  Type of Therapy: Process Group  Topic: Group Process; the first part of group was spent in process. A new group member was present today and he shared a little about himself. One member admitted she was anxious because she has just realized how powerful this disease really is. She had been going to meetings and working her daily recovery, but she admitted that it is kind of scary for her. Other group members shared about this issue and others in early recovery. A member was asked to read the 'Thought for the Day' and it addressed this very issue and the importance of reaching out for support. There was good feedback and disclosure among members.   Group Time: 2:45- 4pm  Participation Level: Active  Behavioral Response: Appropriate and Sharing  Type of Therapy: Psycho-education Group  Topic:Psycho-Ed/Graduation: Handout on "I am Your Disease". Group members passed around a handout and then took turns reading it. It proved very powerful and tied in well with the previous session's conversations. At the conclusion of this session, members observed a graduation. One of the members was leaving after today, having completed the program successfully. There were kind words of hope and encouragement and he seemed genuinely touched by the emotion shared by his fellow group members.    Summary: The patient was new to the group and checked-in with a sobriety date of 10/21. During the part of group he shared about his drinking and his decision to enter residential treatment at [facility]. He noted he had met with me a month ago in preparation for entering this program, but had admitted he couldn't stop on his own. He was at [facility] later that same day. When asked to comment on what he intended to do to remain sober, the patient explained the things he  intended to do, including he noted, not spending time around people, places or things. The patient agreed with another that had he gone to party last night, as another group member had, he would have drunk just like she did. The patient was relaxed and laughed and joked with the other group members. He had kind words to share with the graduating member and although they had not met before, he encouraged this man to do  What he knew he must in order to remain sober. The patient responded well to this first group session.    Family Program: Family present? No   Name of family member(s):   UDS collected: Yes Results: not back yet  AA/NA attended?: No , but he will be attending meetings  Sponsor?: No   EVANS,ANN, LCAS

## 2013-10-17 ENCOUNTER — Other Ambulatory Visit (HOSPITAL_COMMUNITY): Payer: BC Managed Care – PPO | Attending: Psychiatry | Admitting: Psychology

## 2013-10-17 DIAGNOSIS — F411 Generalized anxiety disorder: Secondary | ICD-10-CM | POA: Insufficient documentation

## 2013-10-17 DIAGNOSIS — F1020 Alcohol dependence, uncomplicated: Secondary | ICD-10-CM

## 2013-10-17 DIAGNOSIS — F10939 Alcohol use, unspecified with withdrawal, unspecified: Secondary | ICD-10-CM | POA: Insufficient documentation

## 2013-10-17 DIAGNOSIS — F10239 Alcohol dependence with withdrawal, unspecified: Secondary | ICD-10-CM | POA: Insufficient documentation

## 2013-10-20 ENCOUNTER — Other Ambulatory Visit (HOSPITAL_COMMUNITY): Payer: BC Managed Care – PPO | Attending: Psychiatry | Admitting: Psychology

## 2013-10-20 ENCOUNTER — Encounter (HOSPITAL_COMMUNITY): Payer: Self-pay | Admitting: Psychology

## 2013-10-20 DIAGNOSIS — F1020 Alcohol dependence, uncomplicated: Secondary | ICD-10-CM

## 2013-10-20 DIAGNOSIS — F411 Generalized anxiety disorder: Secondary | ICD-10-CM

## 2013-10-20 NOTE — Progress Notes (Signed)
    Daily Group Progress Note  Program: CD-IOP   Group Time: 1-2:30 pm  Participation Level: Active  Behavioral Response: Appropriate and Sharing  Type of Therapy: Process Group  Topic: Group Process: the first part of group was spent in process. Members shared about current issues and concerns in early recovery. They were also asked to check-in with sobriety date and what they had done since the last session to support their recovery. There as a good discussion and different ideas and suggests made among members. During this session, the PA, CK, was meeting with new patients for the first time.   Group Time: 2:45- 4pm  Participation Level: Active  Behavioral Response: Appropriate and Sharing  Type of Therapy: Psycho-education Group  Topic:Group Process: the first part of group was spent in process. Members shared about current issues and concerns in early recovery. They were also asked to check-in with sobriety date and what they had done since the last session to support their recovery. There as a good discussion and different ideas and suggests made among members. During this session, the PA, CK, was meeting with new patients for the first time.    Summary: The patient reported he was okay and had spent the last two days at his parent's home. He admitted he has more support and is accountable by living with them. The patient admitted he is anxious and stressed out. He went through the history of his drinking and work, but explained about his recent nerve damage and carpal tunnel due to his job requirements. He was turned down for workman's comp despite having been recommended by his employer. The patient agreed that he tends to worry about everything and will work on focusing on "the things he can change". Other group members, two of whom had worked in Teacher, English as a foreign language, encouraged him to secure an attorney. The patient was attentive and made some good comments during the second half of  group. He was able to identify certain triggers. He reported the intention to attend an AA meeting tonight and then identified plans for the weekend, including possibly going to church and attending more AA meetings. He shared that he would go to dinner at his Aunt's house on Sunday - she makes a meal at noon and it is a Sunday ritual. The patient received good feedback and responded well to this intervention. His sobriety date remains 10/21.    Family Program: Family present? No   Name of family member(s):   UDS collected: No Results:   AA/NA attended?: No, but he is new to the program  Sponsor?: No   EVANS,ANN, LCAS

## 2013-10-22 ENCOUNTER — Other Ambulatory Visit (HOSPITAL_COMMUNITY): Payer: BC Managed Care – PPO | Attending: Psychiatry

## 2013-10-24 ENCOUNTER — Other Ambulatory Visit (HOSPITAL_COMMUNITY): Payer: BC Managed Care – PPO

## 2013-10-27 ENCOUNTER — Encounter (HOSPITAL_COMMUNITY): Payer: Self-pay | Admitting: Psychology

## 2013-10-27 ENCOUNTER — Other Ambulatory Visit (HOSPITAL_COMMUNITY): Payer: BC Managed Care – PPO | Attending: Psychiatry | Admitting: Psychology

## 2013-10-27 ENCOUNTER — Encounter (HOSPITAL_COMMUNITY): Payer: Self-pay

## 2013-10-27 DIAGNOSIS — F411 Generalized anxiety disorder: Secondary | ICD-10-CM | POA: Insufficient documentation

## 2013-10-27 DIAGNOSIS — F1020 Alcohol dependence, uncomplicated: Secondary | ICD-10-CM | POA: Insufficient documentation

## 2013-10-28 ENCOUNTER — Encounter (HOSPITAL_COMMUNITY): Payer: Self-pay | Admitting: Psychology

## 2013-10-28 NOTE — Progress Notes (Unsigned)
Patient ID: [name] [name], male   DOB: [birth year], 52 y.o.   MRN: [id number] CD-IOP: Treatment Planning Session. I met with the patient at the conclusion of the group today. He was very open about discussing his goals for treatment. The patient agreed to the first two goals, which include ongoing sobriety and building support for his recovery. in addition to these two goals, the patient identified returning to work and full-time employment. He is currently taking FMLA and is scheduled for wrist surgery on Monday which will likely increase his time out of work. The patient identified addressing his long history of anxiety, which began around age 10-10 when he worried about getting called on in school because of his stuttering. The patient reminded me he eventually found that alcohol was very effective in numbing his anxiety. The documentation was reviewed, signed, and completed accordingly. The patient will meet with the medical director on Friday to discuss any medication needs and his plans going forward. We will continue to follow closely in the days ahead.

## 2013-10-28 NOTE — Progress Notes (Signed)
    Daily Group Progress Note  Program: CD-IOP   Group Time: 1:00-2:30pm  Participation Level: Active  Behavioral Response: Appropriate and Sharing  Type of Therapy: Process Group  Topic: Counselor opened the group by having group members check in, sharing their names and sobriety dates.  Counselor then invited each group member share about what they did over the holiday weekend and how they maintained their sobriety.  The group processed one member's comment that he had not attended any AA or NA meetings for the last 6 days, with several members providing feedback and encouraging him to get back into the habit.  The group also discussed triggers associated with the holidays, particularly traveling to places where they used to use and being around family.  Counseling intern also led group in a discussion on the personal bill of rights, and several group members shared which rights felt especially relevant or meaningful to them.   Group Time: 2:45-4:00pm  Participation Level: Active  Behavioral Response: Appropriate and Sharing  Type of Therapy: Psycho-education Group  Topic: Counseling intern and counselor led group in a discussion on assertive communication.  The group discussed thoughts, feelings, and behaviors associated with being passive, aggressive, passive-aggressive, and assertive.  They also read about specific communication skills that are part of being assertive.  Two group members shared examples of times they had been assertive, and described the outcomes of those events, noting that they were generally positive, and in one instance the other person involved treated the group member with more respect afterward.  Group members discussed personal barriers toward being assertive, the impact of passive-aggressive behavior, and how they can practice being more assertive.  Summary: Patient reported a sobriety date of 10/21.  He shared that he had enjoyed his trip to [place] for the  holiday, particularly seeing his two sons, who were very validating towards him regarding his recovery.  He reported that driving into [place] was triggering for him, as was going to his wife's home to see his children.  He also was triggered by spending time with his brother-in-law, who is in active addiction and repeatedly offered the patient alcohol.  Although he did not tell his brother-in-law that he is in recovery, he successfully used refusal skills to keep himself safe and sober.  During the psychoeducation group, he shared a story about a time he was assertive with a coworker, which caused the coworker to treat him with greater respect.  Family Program: Family present? No   Name of family member(s): N/A  UDS collected: Yes Results: Not yet returned  AA/NA attended?: No  Sponsor?: No   [employer]

## 2013-10-29 ENCOUNTER — Encounter (HOSPITAL_COMMUNITY): Payer: Self-pay

## 2013-10-29 ENCOUNTER — Other Ambulatory Visit (HOSPITAL_COMMUNITY): Payer: BC Managed Care – PPO | Admitting: Psychology

## 2013-10-29 DIAGNOSIS — F1020 Alcohol dependence, uncomplicated: Secondary | ICD-10-CM

## 2013-10-29 DIAGNOSIS — F411 Generalized anxiety disorder: Secondary | ICD-10-CM

## 2013-10-30 ENCOUNTER — Encounter (HOSPITAL_COMMUNITY): Payer: Self-pay | Admitting: Psychology

## 2013-10-30 NOTE — Progress Notes (Signed)
    Daily Group Progress Note  Program: CD-IOP   Group Time: 1-2:30 pm  Participation Level: Active  Behavioral Response: Appropriate and Sharing  Type of Therapy: Process Group  Topic: Group Process: the first part of group was spent in process. Members shared about issues and concerns in early recovery. One member shared that she has been struggling with boredom. Another member had attended her first AA meeting and was warmly greeted by the women present. Another member noted he had reached 90 days today. The group applauded this news. There was good disclosure and support provided among the group. Also present were two students from the [facility] program.   Group Time: 2:45- 4pm  Participation Level: Active  Behavioral Response: Sharing  Type of Therapy: Psycho-education Group  Topic: "The Feeling Chart": the second part of group was spent in a psycho-ed piece with the handout, "The Feeling Chart". the topic was how one progresses in their drug use and eventually becomes hooked 'emotionally' on the chemicals. Parts of the handout were read by group members and they shared how they had experienced each of the stages of use identified in the handout. Many could relate to how they experienced less euphoria and much pain as their use increased. As the session neared the end, one member was expressed her fears and loneliness. I reminded her that this group is a 'kind of' family and she should seek out support through them. Group members responded to this invitation and offered to provide support in the week her parents are gone. This disclosure and response brought the group closer and the camaraderie was more evident as the session came to an end.   Summary: The patient reported he was doing well. He has really been stressed out by the short term disability claims, insurance, and his upcoming wrist surgery. The patient explained he could relate to another group member about  separation papers. He had also been accused of adultery and abandonment, but he had never done either to his family. It had always been a very painful thing and he admitted he had to work hard not to hate his wife for allowing that to be put in the papers. In the psycho-ed piece, the patient reported he didn't used to eat so he could get a bigger buzz when he finally drank at the end of the day. He also noted that he would frequently have to drink a beer in the morning before he could drink because he would be nauseous and vomit if he didn't drink something before eating. The patient remains sober with a date of 10/19, but he has not attended any AA meetings since he entered the program. He will be discharged from the program if he doesn't step up and attend at least 4 AA meetings per week.   Family Program: Family present? No   Name of family member(s):   UDS collected: No Results:   AA/NA attended?: No  Sponsor?: No, but he has been informed that he will have to attend meetings and secure a sponsor   EVANS,ANN, LCAS

## 2013-10-31 ENCOUNTER — Ambulatory Visit (HOSPITAL_COMMUNITY): Payer: Self-pay

## 2013-10-31 ENCOUNTER — Other Ambulatory Visit (HOSPITAL_COMMUNITY): Payer: BC Managed Care – PPO | Admitting: Psychology

## 2013-10-31 ENCOUNTER — Encounter (HOSPITAL_COMMUNITY): Payer: Self-pay

## 2013-10-31 DIAGNOSIS — F1020 Alcohol dependence, uncomplicated: Secondary | ICD-10-CM

## 2013-10-31 DIAGNOSIS — F411 Generalized anxiety disorder: Secondary | ICD-10-CM

## 2013-10-31 MED ORDER — HYDROXYZINE PAMOATE 25 MG PO CAPS: 25.0000 mg | ORAL_CAPSULE | ORAL | Status: DC | PRN

## 2013-10-31 MED ORDER — ESCITALOPRAM OXALATE 10 MG PO TABS: 10.0000 mg | ORAL_TABLET | Freq: Every day | ORAL | Status: DC

## 2013-10-31 NOTE — Progress Notes (Deleted)
[name] [name] is a 52 y.o. male patient ***.        [facility]

## 2013-10-31 NOTE — Progress Notes (Deleted)
    Daily Group Progress Note  Program: {CHL AMB BH IOP/CDIOP Program Type:21022744}   Group Time: ***  Participation Level: {CHL AMB BH Group Participation:21022742}  Behavioral Response: {CHL AMB BH Group Behavior:21022743}  Type of Therapy: {CHL AMB BH Type of Therapy:21022741}  Topic: ***     Group Time: ***  Participation Level: {CHL AMB BH Group Participation:21022742}  Behavioral Response: {CHL AMB BH Group Behavior:21022743}  Type of Therapy: {CHL AMB BH Type of Therapy:21022741}  Topic: ***   Summary: ***   Family Program: Family present? {BHH YES OR NO:22294}   Name of family member(s): ***  UDS collected: {B[facility] YES OR NO:22294} Results: {Findings; urine drug screen:60936}  AA/NA attended?: {B[facility] YES OR NO:22294}{DAYS OF ZOXW:96045}  Sponsor?: {[facility] YES OR WU:98119}   [facility]

## 2013-10-31 NOTE — Progress Notes (Unsigned)
[facility] Behavioral Health Biopsychosocial Assessment  [name] [name] 52 y.o. 10/31/2013   Referred by: [facility] 28 day program after detox at [facility] 5 days   PRESENTING PROBLEM Chief Complaint: "I am an alcoholic-primarily beer" What are the main stressors in your life right now?  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 What effect have they had on your life?:  Left Full college scholarship/multiple er visits/anxiety meds seemed to interfere with his ability to feel he is control-plus reading side effects creates inability to stay on drugs.does desire med trial now.   FAMILY ASSESSMENT Was the significant other/family member interviewed? No If No, why?: Intake session Is significant other/family member supportive? Yes Did significant other/family member express concerns for the patient? No If Yes, describe:   Is significant other/family member willing to be part of treatment plan? NA Describe significant other/family member's perception of patient's illness: NA  Describe significant other/family member's perception of  expectations with treatment: NA   MENTAL HEALTH HISTORY Have you ever been treated for a mental health problem? Yes  If Yes, when? 2007/2009/ 20014 , where?[facility] [place] [facility]  , by whom? SA/CD Providers at institutions;[facility]  Are you currently seeing a therapist or counselor? Yes If Yes, whom? [facility] Have you ever had a mental health hospitalization? Yes If Yes, when? Same as above , where? Same as above, why? Same as above, how many times? Same as above Have you ever had suicidal thoughts or attempted suicide? No If Yes, when? NA Describe NA  Have you ever been treated with medication for a mental health problem? Yes  GAD If Yes, please list as completely as possible (name of medication, reason prescribed, and response: Neurontin for anxiety/trazodone for sleep/campral for PAWS/prilosec for gastritis   FAMILY MENTAL HEALTH HISTORY Is there any history of mental health problems or substance abuse in your family? Yes If Yes, please explain (include information on parents, siblings, aunts/uncles, grandparents, cousins, etc.): as above Has anyone in your family been hospitalized for mental health problems? No If Yes, please explain (including who, where, and for what length of time): na   MARITAL STATUS Are you presently: Separated How many times have you been married? once Dates of previous marriages: na Do you have any concerns regarding marriage? No If Yes, please explain:   Do you have any children? Yes If Yes, how many? 2 Boy 22 Boy 20 in [place] Please list their sexes and ages:    LEISURE/RECREATION Describe how patient spends leisure time: WORKING OUT/EXERCISE BUT NOT IN PAST 3-4 YRS   SOCIAL AND FAMILY HISTORY Who lives in your current household?  Where were you born? [place] Where did you grow up? [place] Describe the household where you grew up: Good christian home  Do you have siblings,  step/half siblings? Yes If Yes, please list names, sex and ages: 2 sisters ages 26 and 44 Are your parents still living? Yes If No, what was the cause of death? na If Yes, father's age: na   His health: na If Yes, mother's age: na Her health: na Where do your parents live? [place] Do you see them often? Yes If No, why not? na  Are your parents separated/divorced? No If Yes, approximately when? na Have you ever been exposed to any form of abuse? No If Yes: na Did the abuse happen recently, or in the past? na Were you the victim or offender, please explainnation na  Are you having problems with any member or your family? No If Yes, please explain: na  What Religion are you? NONDENOMINATIONAL-GRE[employer] IN [employer] Do you have any cultural or religious beliefs which could impact your treatment? Yes If Yes, please explain (including customs, celebrations, attitude towards alcohol and drugs, authority in family, etc):  bELIEVES IN AND NEEDS HIS "hIGHER pOWER" THE "oNE gOD"  Have you ever been in the [employer]? No If Yes, when? NA for how long? NA Were you ever in active combat? NA If Yes, when? NA for how long? NA Were there any lasting effects on you? NA If Yes, please explain: NA  Why did you leave the military (include type of discharge, disciplinary action, substance abuse, or any Post Traumatic Stress Symptoms): na  Do you have any legal problems/involvements? NA If Yes, please explain: LAST dui 1999   EDUCATIONAL BACKGROUND How many grades have you completed? some college Do you hold any Degrees? No If Yes, in what? NA  From where? na What were your special talents/interests in school? fULL sCHOLARSHIP-TRACK/FOOTBALL (400 INTERMEDIATE HURDLES)  Did you have any problems in school? Yes If Yes, were these problems behavioral, attentional, or due to learning difficulties? LD  AS NOTED ABOVE Were any medications ever prescribed for these problems? No If Yes, what were  the medications, including the dosage, how long you took these and who prescribed them? NA   WORK HISTORY Do you work? 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 14 MOS Name of employer: [employer] Do you enjoy your present job? No-STARTED AS TEMP AND ENJOYED THAT BUT TO TAKE FULLTtIME WORK HE MOVED TO ANOTHER DEPT WHICH HE DOES NOT ENJOY What is your previous work history? ALWAYS BEEN A GOOD WORKER BUT ADMITS WHEN HE ROSE TO LEVEL OF SUPERVISOR HE REFUSED TO ADVANCE DUE TO HIS FEARS AROUND HIS LD.WORKED 15 YRS FOR [employer] WHEN IT SHUT DOWN Are you having trouble on your present job or had difficulties holding a job? Yes If Yes, please explain: DEVELOPED CARPAL TUNNEL SYNDROME-FILED WORKER'S COMP AND HAD TO HIRE ATTORNEY AFTER WORKER'S COMP TURNED HIM DOWN (COMPANY DECIDED TO FILE CLAIM FOR HIM)   Does your spouse work? NA If Yes, where and for how long? NA Are you under financial stress? Yes If Yes, please explain: UNABLE TO WORK DUE TO HIS CARPAL TUNNEL-HOPING TO TAKE CARE OF HIS ETOH PROBLEM UNDER SURGICAL LEAVE  Financial Resources  Patient is: Self supportive (no assistance) No    Requires referral for financial assistance No  Requires referral for credit counseling No  Current situation affects financial situation  yes  Adolescent/child in need of financial support NO Is there anything else you would like to tell us? NA  [facility] 10/31/2013

## 2013-11-03 ENCOUNTER — Encounter (HOSPITAL_COMMUNITY): Payer: Self-pay | Admitting: Psychology

## 2013-11-03 ENCOUNTER — Telehealth (HOSPITAL_COMMUNITY): Payer: Self-pay | Admitting: Psychology

## 2013-11-03 ENCOUNTER — Other Ambulatory Visit (HOSPITAL_COMMUNITY): Payer: BC Managed Care – PPO

## 2013-11-03 NOTE — Progress Notes (Signed)
    Daily Group Progress Note  Program: CD-IOP   Group Time: 1:00-2:30pm  Participation Level: Active  Behavioral Response: Appropriate  Type of Therapy: Process Group  Topic: Group members checked in by sharing their names and sobriety dates, then counselor invited them to share about whatever was on their minds.  Group members discussed topics such as self-forgiveness, finishing what they start, and how perfectionism had affected their lives.  When one group member shared that he had attended very few meetings, counselor reviewed the program policy of attending 4 AA/NA meetings per week.  The group member became angry and upset and group members provided feedback about their experiences with meetings, including hesitation to attend and taking time to find groups that fit well.  Group members also provided the group member with feedback about how he presented himself.   Group Time: 2:45-4:00pm  Participation Level: Active  Behavioral Response: Appropriate  Type of Therapy: Psycho-education Group  Topic:Group members read a handout about resentments and discussed the resentments they are carrying, as well as resentments they have let go of in the past.  Counselor and counseling intern discussed the role of expectations and lack of communication in creating resentments.  One group member became emotional as she shared about her resentment toward her parents and the way she was treated growing up.  The group provided her with good feedback and encouragement, and thanked her for opening up to the group.  Counselor closed the group session by having each member share their weekend plans.   Summary:  Patient reported a sobriety date of 10/21.  He shared that he had attended a meeting the night before, and said that he felt unsure about meetings but was willing to try them out.  He reported that he had rescheduled the surgery on his hand because he had failed to get assistance with the copay.   He said that he felt discouraged, but was trusting that everything would work out and was trying to "stay positive."  Counselor encouraged him to continue to attend meetings and gave him a weekend assignment of calling two people whose numbers he had received at the last meeting.   Family Program: Family present? No   Name of family member(s): N/a  UDS collected: No Results: None  AA/NA attended?: YesThursday  Sponsor?: No   [facility]

## 2013-11-05 ENCOUNTER — Other Ambulatory Visit (HOSPITAL_COMMUNITY): Payer: BC Managed Care – PPO

## 2013-11-07 ENCOUNTER — Other Ambulatory Visit (HOSPITAL_COMMUNITY): Payer: BC Managed Care – PPO

## 2013-11-09 ENCOUNTER — Encounter (HOSPITAL_COMMUNITY): Payer: Self-pay | Admitting: Psychology

## 2013-11-09 NOTE — Progress Notes (Signed)
Daily Group Progress Note  Program: CD-IOP   Group Time: 1-2:30 pm  Participation Level: Active  Behavioral Response: Sharing, Resistant, Rationalizing  Type of Therapy: Process Group  Topic: Group Process: the first part of group was spent in process. Members shared about the past weekend and things they do to support their recovery. Two new group members were present and introduced themselves over the course of their first group sessions. At least two members seemed to be 'making excuses' for not attending any 12-Step meetings and they were challenged about this. Other members emphasized how much the [facility] has helped them. Prior to the break, members were given copies of "The Feeling Wheel" and were asked to identify the feelings they were aware of at this moment. Some have a difficult time doing this while others identify up to 10 feelings listened on the wheel. There was good discussion and feedback among the group.   Group Time: 2:45- 4PM  Participation Level: Active  Behavioral Response: Sharing  Type of Therapy: Psycho-education Group  Topic: Triggers and Relapse: a Slide Show was presented in the second half of group. This presentation, from [facility], identified the process of addiction through the reinforcement that comes through continued use. Members shared their experiences and provided examples as the slide show progressed. The importance of understanding how triggers develop was emphasized. Eliminating triggers when possible and then learning to deal more effectively with feelings that are triggered was emphasized as a significant part of recovery. The session invited feedback and discussion among members.   Summary: The patient reported he had spent the weekend doing yard work and other things around his family's home. When questioned about meetings or even church, the patient denied having attended any meetings or gone to church. He explained that he has  a lot going on and his truck isn't running properly and he can't risk going somewhere and it breaking down. I reminded him that he must develop relationships in the [facility] or he just won't be able to maintain his sobriety for very long. The patient noted he hadn't liked AA the last time he was working on recovery and "its just not for me". Another member, who was new, reported he had attended a meeting last night and it had been a really good one. This patient seemed to make up many different excuses for not going to a meeting. Another member reminded him that he had always managed to get alcohol - no matter whether his car was working or not. The patient was resistant and in denial about his need for support. This patient, along with another member, were both against AA and seemed to support each other in their resistance and justification. Other members seemed frustrated by their excuses. In the second half of group, the patient was attentive, but somewhat reserved during the presentation on triggers. As the session neared an end, the patient reported he would not be in group on Wednesday because he was traveling to [place] with his parents to visit with his two sons. He insisted that he would be fine and would avoid the old drinking places and buddies from his past. It remains to be seen whether this fellow will get with the program and start attending meetings. He is somewhat of a control freak and is spending a lot of time on things that are out of his control. He reported his sobriety date remains 10/21.    Family Program: Family present? No  Name of family member(s):   UDS collected: No Results:   AA/NA attended?: No  Sponsor?: No   EVANS,ANN, LCAS

## 2013-11-10 ENCOUNTER — Other Ambulatory Visit (HOSPITAL_COMMUNITY): Payer: BC Managed Care – PPO

## 2013-11-12 ENCOUNTER — Other Ambulatory Visit (HOSPITAL_COMMUNITY): Payer: BC Managed Care – PPO

## 2013-11-14 ENCOUNTER — Other Ambulatory Visit (HOSPITAL_COMMUNITY): Payer: BC Managed Care – PPO

## 2013-11-17 ENCOUNTER — Other Ambulatory Visit (HOSPITAL_COMMUNITY): Payer: BC Managed Care – PPO

## 2013-11-27 DIAGNOSIS — K7010 Alcoholic hepatitis without ascites: Secondary | ICD-10-CM

## 2013-11-27 HISTORY — DX: Alcoholic hepatitis without ascites: K70.10

## 2013-12-26 ENCOUNTER — Ambulatory Visit (HOSPITAL_COMMUNITY): Payer: Self-pay | Admitting: Psychiatry

## 2014-01-18 ENCOUNTER — Emergency Department (HOSPITAL_COMMUNITY)
Admission: EM | Admit: 2014-01-18 | Discharge: 2014-01-19 | Disposition: A | Discharge status: Other Acute Inpatient Hospital | Payer: No Typology Code available for payment source | Attending: Emergency Medicine | Admitting: Emergency Medicine

## 2014-01-18 ENCOUNTER — Emergency Department (HOSPITAL_COMMUNITY): Payer: BC Managed Care – PPO

## 2014-01-18 ENCOUNTER — Encounter (HOSPITAL_COMMUNITY): Payer: Self-pay | Admitting: Emergency Medicine

## 2014-01-18 DIAGNOSIS — F17200 Nicotine dependence, unspecified, uncomplicated: Secondary | ICD-10-CM | POA: Insufficient documentation

## 2014-01-18 DIAGNOSIS — F1010 Alcohol abuse, uncomplicated: Secondary | ICD-10-CM | POA: Insufficient documentation

## 2014-01-18 DIAGNOSIS — F3289 Other specified depressive episodes: Secondary | ICD-10-CM | POA: Insufficient documentation

## 2014-01-18 DIAGNOSIS — K859 Acute pancreatitis without necrosis or infection, unspecified: Secondary | ICD-10-CM | POA: Insufficient documentation

## 2014-01-18 DIAGNOSIS — F10939 Alcohol use, unspecified with withdrawal, unspecified: Secondary | ICD-10-CM

## 2014-01-18 DIAGNOSIS — Z79899 Other long term (current) drug therapy: Secondary | ICD-10-CM | POA: Insufficient documentation

## 2014-01-18 DIAGNOSIS — F1020 Alcohol dependence, uncomplicated: Secondary | ICD-10-CM

## 2014-01-18 DIAGNOSIS — F329 Major depressive disorder, single episode, unspecified: Secondary | ICD-10-CM | POA: Insufficient documentation

## 2014-01-18 DIAGNOSIS — F411 Generalized anxiety disorder: Secondary | ICD-10-CM | POA: Insufficient documentation

## 2014-01-18 DIAGNOSIS — Z8669 Personal history of other diseases of the nervous system and sense organs: Secondary | ICD-10-CM | POA: Insufficient documentation

## 2014-01-18 DIAGNOSIS — F10239 Alcohol dependence with withdrawal, unspecified: Secondary | ICD-10-CM

## 2014-01-18 DIAGNOSIS — D869 Sarcoidosis, unspecified: Secondary | ICD-10-CM | POA: Insufficient documentation

## 2014-01-18 LAB — COMPREHENSIVE METABOLIC PANEL
ALT: 39 U/L (ref 0–53)
AST: 54 U/L — ABNORMAL HIGH (ref 0–37)
Albumin: 4.3 g/dL (ref 3.5–5.2)
Alkaline Phosphatase: 45 U/L (ref 39–117)
BUN: 6 mg/dL (ref 6–23)
CO2: 25 mEq/L (ref 19–32)
Calcium: 9.1 mg/dL (ref 8.4–10.5)
Chloride: 96 mEq/L (ref 96–112)
Creatinine, Ser: 1 mg/dL (ref 0.50–1.35)
GFR calc Af Amer: >90 mL/min (ref >90)
GFR calc non Af Amer: 85 mL/min — ABNORMAL LOW (ref >90)
Glucose, Bld: 108 mg/dL — ABNORMAL HIGH (ref 70–99)
Potassium: 4 mEq/L (ref 3.7–5.3)
Sodium: 138 mEq/L (ref 137–147)
Total Bilirubin: 0.4 mg/dL (ref 0.3–1.2)
Total Protein: 9.1 g/dL — ABNORMAL HIGH (ref 6.0–8.3)

## 2014-01-18 LAB — CBC WITH DIFFERENTIAL/PLATELET
Basophils Absolute: 0 10*3/uL (ref 0.0–0.1)
Basophils Relative: 0 % (ref 0–1)
Eosinophils Absolute: 0.1 10*3/uL (ref 0.0–0.7)
Eosinophils Relative: 1 % (ref 0–5)
HCT: 42.2 % (ref 39.0–52.0)
Hemoglobin: 14 g/dL (ref 13.0–17.0)
Lymphocytes Relative: 42 % (ref 12–46)
Lymphs Abs: 2.5 10*3/uL (ref 0.7–4.0)
MCH: 27 pg (ref 26.0–34.0)
MCHC: 33.2 g/dL (ref 30.0–36.0)
MCV: 81.3 fL (ref 78.0–100.0)
Monocytes Absolute: 0.8 10*3/uL (ref 0.1–1.0)
Monocytes Relative: 13 % — ABNORMAL HIGH (ref 3–12)
Neutro Abs: 2.6 10*3/uL (ref 1.7–7.7)
Neutrophils Relative %: 43 % (ref 43–77)
Platelets: 179 10*3/uL (ref 150–400)
RBC: 5.19 MIL/uL (ref 4.22–5.81)
RDW: 16.2 % — ABNORMAL HIGH (ref 11.5–15.5)
WBC: 6 10*3/uL (ref 4.0–10.5)

## 2014-01-18 LAB — URINALYSIS, ROUTINE W REFLEX MICROSCOPIC
Bilirubin Urine: NEGATIVE
Glucose, UA: NEGATIVE mg/dL
Hgb urine dipstick: NEGATIVE
Ketones, ur: NEGATIVE mg/dL
Leukocytes, UA: NEGATIVE
Nitrite: NEGATIVE
Protein, ur: NEGATIVE mg/dL
Specific Gravity, Urine: 1.006 (ref 1.005–1.030)
Urobilinogen, UA: 0.2 mg/dL (ref 0.0–1.0)
pH: 6 (ref 5.0–8.0)

## 2014-01-18 LAB — RAPID URINE DRUG SCREEN, HOSP PERFORMED
Amphetamines: NOT DETECTED
Barbiturates: NOT DETECTED
Benzodiazepines: NOT DETECTED
Cocaine: NOT DETECTED
Opiates: NOT DETECTED
Tetrahydrocannabinol: NOT DETECTED

## 2014-01-18 LAB — LIPASE, BLOOD: Lipase: 129 U/L — ABNORMAL HIGH (ref 11–59)

## 2014-01-18 LAB — ETHANOL: Alcohol, Ethyl (B): 328 mg/dL — ABNORMAL HIGH (ref 0–11)

## 2014-01-18 MED ORDER — LORAZEPAM 1 MG PO TABS: 0.0000 mg | ORAL_TABLET | Freq: Two times a day (BID) | ORAL | Status: DC

## 2014-01-18 MED ORDER — ACETAMINOPHEN 325 MG PO TABS: 650.0000 mg | ORAL_TABLET | ORAL | Status: DC | PRN

## 2014-01-18 MED ORDER — LORAZEPAM 1 MG PO TABS
0.0000 mg | ORAL_TABLET | Freq: Four times a day (QID) | ORAL | Status: DC
Start: 2014-01-19 — End: 2014-01-19
  Administered 2014-01-19: 2 mg via ORAL
  Administered 2014-01-19: 1 mg via ORAL
  Administered 2014-01-19: 2 mg via ORAL
  Filled 2014-01-18 (×2): qty 2
  Filled 2014-01-18: qty 1

## 2014-01-18 MED ORDER — SODIUM CHLORIDE 0.9 % IV BOLUS (SEPSIS)
1000.0000 mL | Freq: Once | INTRAVENOUS | Status: AC
  Administered 2014-01-18: 1000 mL via INTRAVENOUS

## 2014-01-18 MED ORDER — ONDANSETRON HCL 4 MG/2ML IJ SOLN
4.0000 mg | Freq: Once | INTRAMUSCULAR | Status: AC
  Administered 2014-01-18: 4 mg via INTRAVENOUS
  Filled 2014-01-18: qty 2

## 2014-01-18 MED ORDER — MORPHINE SULFATE 4 MG/ML IJ SOLN
4.0000 mg | Freq: Once | INTRAMUSCULAR | Status: AC
  Administered 2014-01-18: 4 mg via INTRAVENOUS
  Filled 2014-01-18: qty 1

## 2014-01-18 MED ORDER — PANTOPRAZOLE SODIUM 40 MG IV SOLR
40.0000 mg | Freq: Once | INTRAVENOUS | Status: AC
  Administered 2014-01-18: 40 mg via INTRAVENOUS
  Filled 2014-01-18: qty 40

## 2014-01-18 MED ORDER — ONDANSETRON HCL 4 MG PO TABS
4.0000 mg | ORAL_TABLET | Freq: Three times a day (TID) | ORAL | Status: DC | PRN
  Administered 2014-01-19: 4 mg via ORAL
  Filled 2014-01-18: qty 1

## 2014-01-18 MED ORDER — GI COCKTAIL ~~LOC~~
30.0000 mL | Freq: Once | ORAL | Status: AC
  Administered 2014-01-18: 30 mL via ORAL
  Filled 2014-01-18 (×2): qty 30

## 2014-01-18 NOTE — ED Notes (Signed)
Pt is c/o upper abd pain and has nausea  Pt states he has not vomited but feels like he is going to   Pt states he also thinks he has a sinus infection because he is coughing up green sputum  Pt states he is an alcoholic and has relapsed  Pt states he has been drinking beer  Pt states he feels like he has toxins in his body  Pt states he just relapsed in his drinking x 1 month ago  Pt states he just feels sick  Pt is requesting detox   Pt states he has a knot on the back of his head that is getting bigger   States it started about 2 weeks ago

## 2014-01-18 NOTE — ED Provider Notes (Addendum)
CSN: [id number]     Arrival date & time 01/18/14  2005 History   First MD Initiated Contact with Patient 01/18/14 2111     Chief Complaint  Patient presents with  . Abdominal Pain  . Nausea  . Medical Clearance     (Consider location/radiation/quality/duration/timing/severity/associated sxs/prior Treatment) HPI Comments: Patient presents with epigastric pain and nausea. He has a history of alcoholism and was recently in a treatment program this past fall. He states he relapsed about 2 months ago. Over the last 3-4 months she's had discomfort in his upper abdomen with associated nausea. He's had no vomiting. He denies any black stools or melena. He denies any fevers or chills. He's had a little bit of coughing and chest congestion as well as some nasal congestion. He requests alcohol detox. He denies any suicidal or homicidal ideations. He does have a history of pancreatitis in the past.   Past Medical History  Diagnosis Date  . Alcohol abuse   . Sarcoidosis of lung   . Reflux   . Depression   . Anxiety   . Pancreatitis   . Carpal tunnel syndrome of right wrist    Past Surgical History  Procedure Laterality Date  . Knee arthroscopy    . Carpel tunnel release    . Elbow surgery     Family History  Problem Relation Age of Onset  . Diabetes Father   . Hypertension Father   . Hypertension Paternal Grandfather   . Alcohol abuse Paternal Grandfather   . Alcohol abuse Maternal Grandfather   . Alcohol abuse Maternal Grandmother   . Alcohol abuse Paternal Grandmother    History  Substance Use Topics  . Smoking status: Current Some Day Smoker    Types: Cigars  . Smokeless tobacco: Not on file  . Alcohol Use: 30.0 oz/week    50 Cans of beer per week     Comment: Relapsed 7/8 beers a day. Last today AM    Review of Systems  Constitutional: Negative for fever, chills, diaphoresis and fatigue.  HENT: Positive for congestion and rhinorrhea. Negative for sneezing.   Eyes:  Negative.   Respiratory: Positive for cough. Negative for chest tightness and shortness of breath.   Cardiovascular: Negative for chest pain and leg swelling.  Gastrointestinal: Positive for nausea and abdominal pain. Negative for vomiting, diarrhea and blood in stool.  Genitourinary: Negative for frequency, hematuria, flank pain and difficulty urinating.  Musculoskeletal: Negative for arthralgias and back pain.  Skin: Negative for rash.  Neurological: Negative for dizziness, speech difficulty, weakness, numbness and headaches.      Allergies  Review of patient's allergies indicates no known allergies.  Home Medications   Current Outpatient Rx  Name  Route  Sig  Dispense  Refill  . escitalopram (LEXAPRO) 10 MG tablet   Oral   Take 1 tablet (10 mg total) by mouth daily.   30 tablet   2   . vitamin B-12 (CYANOCOBALAMIN) 100 MCG tablet   Oral   Take 100 mcg by mouth daily.         . vitamin C (ASCORBIC ACID) 250 MG tablet   Oral   Take 250 mg by mouth daily.         Marland Kitchen gabapentin (NEURONTIN) 100 MG capsule   Oral   Take 2 capsules (200 mg total) by mouth 3 (three) times daily. For mood stabilization and neuropathic pain.   180 capsule   0   . hydrOXYzine (VISTARIL) 25 MG  capsule   Oral   Take 1 capsule (25 mg total) by mouth every 4 (four) hours as needed for anxiety.   90 capsule   0   . traZODone (DESYREL) 100 MG tablet   Oral   Take 1 tablet (100 mg total) by mouth at bedtime. For insomnia.   30 tablet   0    BP 130/79  Pulse 90  Temp(Src) 98.9 F (37.2 C) (Oral)  Resp 18  Ht 5\' 9"  (1.753 m)  Wt 146 lb 6 oz (66.395 kg)  BMI 21.61 kg/m2  SpO2 92% Physical Exam  Constitutional: He is oriented to person, place, and time. He appears well-developed and well-nourished.  HENT:  Head: Normocephalic and atraumatic.  Mouth/Throat: Oropharynx is clear and moist.  Eyes: Pupils are equal, round, and reactive to light.  Neck: Normal range of motion. Neck  supple.  Cardiovascular: Normal rate, regular rhythm and normal heart sounds.   Pulmonary/Chest: Effort normal and breath sounds normal. No respiratory distress. He has no wheezes. He has no rales. He exhibits no tenderness.  Abdominal: Soft. Bowel sounds are normal. There is tenderness (moderate tenderness across the upper abdomen). There is no rebound and no guarding.  Musculoskeletal: Normal range of motion. He exhibits no edema.  Lymphadenopathy:    He has no cervical adenopathy.  Neurological: He is alert and oriented to person, place, and time.  Skin: Skin is warm and dry. No rash noted.  Psychiatric: He has a normal mood and affect.    ED Course  Procedures (including critical care time) Labs Review Results for orders placed during the hospital encounter of 01/18/14  CBC WITH DIFFERENTIAL      Result Value Ref Range   WBC 6.0  4.0 - 10.5 K/uL   RBC 5.19  4.22 - 5.81 MIL/uL   Hemoglobin 14.0  13.0 - 17.0 g/dL   HCT 42.2  39.0 - 52.0 %   MCV 81.3  78.0 - 100.0 fL   MCH 27.0  26.0 - 34.0 pg   MCHC 33.2  30.0 - 36.0 g/dL   RDW 16.2 (*) 11.5 - 15.5 %   Platelets 179  150 - 400 K/uL   Neutrophils Relative % 43  43 - 77 %   Neutro Abs 2.6  1.7 - 7.7 K/uL   Lymphocytes Relative 42  12 - 46 %   Lymphs Abs 2.5  0.7 - 4.0 K/uL   Monocytes Relative 13 (*) 3 - 12 %   Monocytes Absolute 0.8  0.1 - 1.0 K/uL   Eosinophils Relative 1  0 - 5 %   Eosinophils Absolute 0.1  0.0 - 0.7 K/uL   Basophils Relative 0  0 - 1 %   Basophils Absolute 0.0  0.0 - 0.1 K/uL  COMPREHENSIVE METABOLIC PANEL      Result Value Ref Range   Sodium 138  137 - 147 mEq/L   Potassium 4.0  3.7 - 5.3 mEq/L   Chloride 96  96 - 112 mEq/L   CO2 25  19 - 32 mEq/L   Glucose, Bld 108 (*) 70 - 99 mg/dL   BUN 6  6 - 23 mg/dL   Creatinine, Ser 1.00  0.50 - 1.35 mg/dL   Calcium 9.1  8.4 - 10.5 mg/dL   Total Protein 9.1 (*) 6.0 - 8.3 g/dL   Albumin 4.3  3.5 - 5.2 g/dL   AST 54 (*) 0 - 37 U/L   ALT 39  0 - 53 U/L  Alkaline Phosphatase 45  39 - 117 U/L   Total Bilirubin 0.4  0.3 - 1.2 mg/dL   GFR calc non Af Amer 85 (*) >90 mL/min   GFR calc Af Amer >90  >90 mL/min  LIPASE, BLOOD      Result Value Ref Range   Lipase 129 (*) 11 - 59 U/L  URINALYSIS, ROUTINE W REFLEX MICROSCOPIC      Result Value Ref Range   Color, Urine YELLOW  YELLOW   APPearance CLEAR  CLEAR   Specific Gravity, Urine 1.006  1.005 - 1.030   pH 6.0  5.0 - 8.0   Glucose, UA NEGATIVE  NEGATIVE mg/dL   Hgb urine dipstick NEGATIVE  NEGATIVE   Bilirubin Urine NEGATIVE  NEGATIVE   Ketones, ur NEGATIVE  NEGATIVE mg/dL   Protein, ur NEGATIVE  NEGATIVE mg/dL   Urobilinogen, UA 0.2  0.0 - 1.0 mg/dL   Nitrite NEGATIVE  NEGATIVE   Leukocytes, UA NEGATIVE  NEGATIVE  ETHANOL      Result Value Ref Range   Alcohol, Ethyl (B) 328 (*) 0 - 11 mg/dL  URINE RAPID DRUG SCREEN (HOSP PERFORMED)      Result Value Ref Range   Opiates NONE DETECTED  NONE DETECTED   Cocaine NONE DETECTED  NONE DETECTED   Benzodiazepines NONE DETECTED  NONE DETECTED   Amphetamines NONE DETECTED  NONE DETECTED   Tetrahydrocannabinol NONE DETECTED  NONE DETECTED   Barbiturates NONE DETECTED  NONE DETECTED   Dg Chest 2 View  01/18/2014   CLINICAL DATA:  Cough for a few months, abdominal pain.  EXAM: CHEST  2 VIEW  COMPARISON:  No comparisons  FINDINGS: Diffuse interstitial prominence, with bilateral hilar retraction, apical scarring and apparent reticular nodular densities. No pleural effusions. No pneumothorax.  Cardiomediastinal silhouette is unremarkable. Soft tissue planes and included osseous structures are nonsuspicious.  IMPRESSION: Diffuse interstitial prominence, with scarring in the lung apices associated with reticular nodular densities, this may reflect chronic granulomatous disease or patient's reported sarcoidosis, though superimposed acute component component cannot be excluded. Recommend follow-up imaging to verify stability of findings.   Electronically  Signed   By: Elon Alas   On: 01/18/2014 21:57     Imaging Review Dg Chest 2 View  01/18/2014   CLINICAL DATA:  Cough for a few months, abdominal pain.  EXAM: CHEST  2 VIEW  COMPARISON:  No comparisons  FINDINGS: Diffuse interstitial prominence, with bilateral hilar retraction, apical scarring and apparent reticular nodular densities. No pleural effusions. No pneumothorax.  Cardiomediastinal silhouette is unremarkable. Soft tissue planes and included osseous structures are nonsuspicious.  IMPRESSION: Diffuse interstitial prominence, with scarring in the lung apices associated with reticular nodular densities, this may reflect chronic granulomatous disease or patient's reported sarcoidosis, though superimposed acute component component cannot be excluded. Recommend follow-up imaging to verify stability of findings.   Electronically Signed   By: Elon Alas   On: 01/18/2014 21:57    EKG Interpretation   None       MDM   Final diagnoses:  Alcohol abuse  Sarcoidosis  Pancreatitis    Patient presents with upper abdominal pain and requesting detox. His lipase is slightly elevated but he has no vomiting or uncontrolled pain. He has no fevers. I feel this is likely chronic for him. He states the pains been going on for several months. He also has evidence of sarcoidosis on his chest x-ray which could be worse than his baseline. He has a minor cough  but otherwise is not complaining of any respiratory symptoms. I did include instructions for his discharge papers that he will need outpatient followup with his primary care physician for repeat chest x-ray. At this point we will contact TTS regarding detox placement. I also placed him on the CIWA protocol.  I did advise pt that he needs to f/u with his PMD regarding his worsening CXR.  Pt has sats of 96-98% on RA on my reexam with no respiratory symptoms.  Malvin Johns, MD 01/18/14 2841  Malvin Johns, MD 01/18/14 [phone]

## 2014-01-19 ENCOUNTER — Encounter (HOSPITAL_COMMUNITY): Payer: Self-pay | Admitting: *Deleted

## 2014-01-19 ENCOUNTER — Inpatient Hospital Stay (HOSPITAL_COMMUNITY)
Admission: AD | Admit: 2014-01-19 | Discharge: 2014-01-23 | DRG: 897 | Disposition: A | Discharge status: Home / Self Care | Payer: No Typology Code available for payment source | Source: Intra-hospital | Attending: Psychiatry | Admitting: Psychiatry

## 2014-01-19 DIAGNOSIS — Z9119 Patient's noncompliance with other medical treatment and regimen: Secondary | ICD-10-CM

## 2014-01-19 DIAGNOSIS — F1994 Other psychoactive substance use, unspecified with psychoactive substance-induced mood disorder: Secondary | ICD-10-CM

## 2014-01-19 DIAGNOSIS — F1020 Alcohol dependence, uncomplicated: Secondary | ICD-10-CM | POA: Diagnosis present

## 2014-01-19 DIAGNOSIS — G589 Mononeuropathy, unspecified: Secondary | ICD-10-CM | POA: Diagnosis present

## 2014-01-19 DIAGNOSIS — F329 Major depressive disorder, single episode, unspecified: Secondary | ICD-10-CM | POA: Diagnosis present

## 2014-01-19 DIAGNOSIS — Z91199 Patient's noncompliance with other medical treatment and regimen due to unspecified reason: Secondary | ICD-10-CM

## 2014-01-19 DIAGNOSIS — F10239 Alcohol dependence with withdrawal, unspecified: Principal | ICD-10-CM | POA: Diagnosis present

## 2014-01-19 DIAGNOSIS — F10939 Alcohol use, unspecified with withdrawal, unspecified: Principal | ICD-10-CM | POA: Diagnosis present

## 2014-01-19 DIAGNOSIS — F1010 Alcohol abuse, uncomplicated: Secondary | ICD-10-CM | POA: Diagnosis present

## 2014-01-19 DIAGNOSIS — F411 Generalized anxiety disorder: Secondary | ICD-10-CM | POA: Diagnosis present

## 2014-01-19 DIAGNOSIS — F1094 Alcohol use, unspecified with alcohol-induced mood disorder: Secondary | ICD-10-CM | POA: Diagnosis present

## 2014-01-19 MED ORDER — LOPERAMIDE HCL 2 MG PO CAPS: 2.0000 mg | ORAL_CAPSULE | ORAL | Status: AC | PRN

## 2014-01-19 MED ORDER — THIAMINE HCL 100 MG/ML IJ SOLN
100.0000 mg | Freq: Once | INTRAMUSCULAR | Status: AC
  Administered 2014-01-19: 100 mg via INTRAMUSCULAR
  Filled 2014-01-19: qty 2

## 2014-01-19 MED ORDER — ALUM & MAG HYDROXIDE-SIMETH 200-200-20 MG/5ML PO SUSP: 30.0000 mL | ORAL | Status: DC | PRN

## 2014-01-19 MED ORDER — CHLORDIAZEPOXIDE HCL 25 MG PO CAPS: 25.0000 mg | ORAL_CAPSULE | Freq: Every day | ORAL | Status: DC

## 2014-01-19 MED ORDER — TRAZODONE HCL 100 MG PO TABS
100.0000 mg | ORAL_TABLET | Freq: Every day | ORAL | Status: DC
  Administered 2014-01-19: 100 mg via ORAL
  Filled 2014-01-19 (×4): qty 1

## 2014-01-19 MED ORDER — HYDROXYZINE HCL 25 MG PO TABS
25.0000 mg | ORAL_TABLET | Freq: Four times a day (QID) | ORAL | Status: AC | PRN
  Administered 2014-01-20 (×2): 25 mg via ORAL
  Filled 2014-01-19 (×2): qty 1

## 2014-01-19 MED ORDER — LORAZEPAM 1 MG PO TABS
1.0000 mg | ORAL_TABLET | Freq: Once | ORAL | Status: AC
  Administered 2014-01-19: 1 mg via ORAL
  Filled 2014-01-19: qty 1

## 2014-01-19 MED ORDER — NICOTINE 21 MG/24HR TD PT24
21.0000 mg | MEDICATED_PATCH | Freq: Every day | TRANSDERMAL | Status: DC
  Filled 2014-01-19: qty 1

## 2014-01-19 MED ORDER — GABAPENTIN 100 MG PO CAPS
200.0000 mg | ORAL_CAPSULE | Freq: Three times a day (TID) | ORAL | Status: DC
  Administered 2014-01-19 – 2014-01-23 (×11): 200 mg via ORAL
  Filled 2014-01-19 (×16): qty 2

## 2014-01-19 MED ORDER — ONDANSETRON 4 MG PO TBDP
4.0000 mg | ORAL_TABLET | Freq: Four times a day (QID) | ORAL | Status: AC | PRN
  Administered 2014-01-20 – 2014-01-21 (×3): 4 mg via ORAL
  Filled 2014-01-19 (×3): qty 1

## 2014-01-19 MED ORDER — CHLORDIAZEPOXIDE HCL 25 MG PO CAPS
25.0000 mg | ORAL_CAPSULE | Freq: Three times a day (TID) | ORAL | Status: AC
  Administered 2014-01-21 – 2014-01-22 (×3): 25 mg via ORAL
  Filled 2014-01-19 (×4): qty 1

## 2014-01-19 MED ORDER — ADULT MULTIVITAMIN W/MINERALS CH
1.0000 | ORAL_TABLET | Freq: Every day | ORAL | Status: DC
  Administered 2014-01-20 – 2014-01-23 (×4): 1 via ORAL
  Filled 2014-01-19 (×6): qty 1

## 2014-01-19 MED ORDER — PNEUMOCOCCAL VAC POLYVALENT 25 MCG/0.5ML IJ INJ: 0.5000 mL | INJECTION | INTRAMUSCULAR | Status: DC

## 2014-01-19 MED ORDER — CHLORDIAZEPOXIDE HCL 25 MG PO CAPS
25.0000 mg | ORAL_CAPSULE | Freq: Four times a day (QID) | ORAL | Status: AC
  Administered 2014-01-20 – 2014-01-21 (×6): 25 mg via ORAL
  Filled 2014-01-19 (×5): qty 1

## 2014-01-19 MED ORDER — ONDANSETRON HCL 4 MG PO TABS: 4.0000 mg | ORAL_TABLET | Freq: Three times a day (TID) | ORAL | Status: DC | PRN

## 2014-01-19 MED ORDER — CHLORDIAZEPOXIDE HCL 25 MG PO CAPS
25.0000 mg | ORAL_CAPSULE | ORAL | Status: AC
  Administered 2014-01-22 – 2014-01-23 (×2): 25 mg via ORAL
  Filled 2014-01-19 (×2): qty 1

## 2014-01-19 MED ORDER — TRAZODONE HCL 50 MG PO TABS: 50.0000 mg | ORAL_TABLET | Freq: Every evening | ORAL | Status: DC | PRN

## 2014-01-19 MED ORDER — ZOLPIDEM TARTRATE 5 MG PO TABS
5.0000 mg | ORAL_TABLET | Freq: Every evening | ORAL | Status: DC | PRN
  Administered 2014-01-19: 5 mg via ORAL
  Filled 2014-01-19: qty 1

## 2014-01-19 MED ORDER — ESCITALOPRAM OXALATE 10 MG PO TABS
10.0000 mg | ORAL_TABLET | Freq: Every day | ORAL | Status: DC
Start: 2014-01-20 — End: 2014-01-23
  Administered 2014-01-20 – 2014-01-23 (×4): 10 mg via ORAL
  Filled 2014-01-19 (×6): qty 1

## 2014-01-19 MED ORDER — IBUPROFEN 200 MG PO TABS: 600.0000 mg | ORAL_TABLET | Freq: Three times a day (TID) | ORAL | Status: DC | PRN

## 2014-01-19 MED ORDER — CHLORDIAZEPOXIDE HCL 25 MG PO CAPS
25.0000 mg | ORAL_CAPSULE | Freq: Once | ORAL | Status: AC
  Administered 2014-01-19: 25 mg via ORAL
  Filled 2014-01-19: qty 1

## 2014-01-19 MED ORDER — MAGNESIUM HYDROXIDE 400 MG/5ML PO SUSP: 30.0000 mL | Freq: Every day | ORAL | Status: DC | PRN

## 2014-01-19 MED ORDER — VITAMIN B-1 100 MG PO TABS
100.0000 mg | ORAL_TABLET | Freq: Every day | ORAL | Status: DC
Start: 2014-01-20 — End: 2014-01-23
  Administered 2014-01-20 – 2014-01-23 (×4): 100 mg via ORAL
  Filled 2014-01-19 (×6): qty 1

## 2014-01-19 MED ORDER — ACETAMINOPHEN 325 MG PO TABS: 650.0000 mg | ORAL_TABLET | ORAL | Status: DC | PRN

## 2014-01-19 MED ORDER — ACETAMINOPHEN 325 MG PO TABS
650.0000 mg | ORAL_TABLET | Freq: Four times a day (QID) | ORAL | Status: DC | PRN
  Administered 2014-01-21: 650 mg via ORAL
  Filled 2014-01-19: qty 2

## 2014-01-19 MED ORDER — ESCITALOPRAM OXALATE 10 MG PO TABS
10.0000 mg | ORAL_TABLET | Freq: Every day | ORAL | Status: DC
  Administered 2014-01-19: 10 mg via ORAL
  Filled 2014-01-19: qty 1

## 2014-01-19 MED ORDER — CHLORDIAZEPOXIDE HCL 25 MG PO CAPS
25.0000 mg | ORAL_CAPSULE | Freq: Four times a day (QID) | ORAL | Status: AC | PRN
  Administered 2014-01-20: 25 mg via ORAL
  Filled 2014-01-19: qty 1

## 2014-01-19 NOTE — BH Assessment (Signed)
Assessment Note  [name] [name] is a 53 y.o. male presenting to [facility] for alcohol detox.  Pt denies SI/Hi/AVH.  Pt states he relapsed on alcohol, approx 2 mos ago due to medical problems.  Pt says he 3-4, daily, last drink was 01/18/14.  Pt says he drank just 2-40's, BAL 328.  Pt denies any legal issues or problems with seizures or blackouts.  Pt is c/o anxiety, no other w/d sxs at this time.    Axis I: Alcohol Use D/O, Severe  Axis II: Deferred Axis III:  Past Medical History  Diagnosis Date  . Alcohol abuse   . Sarcoidosis of lung   . Reflux   . Depression   . Anxiety   . Pancreatitis   . Carpal tunnel syndrome of right wrist    Axis IV: other psychosocial or environmental problems, problems related to social environment and problems with primary support group Axis V: 41-50 serious symptoms  Past Medical History:  Past Medical History  Diagnosis Date  . Alcohol abuse   . Sarcoidosis of lung   . Reflux   . Depression   . Anxiety   . Pancreatitis   . Carpal tunnel syndrome of right wrist     Past Surgical History  Procedure Laterality Date  . Knee arthroscopy    . Carpel tunnel release    . Elbow surgery      Family History:  Family History  Problem Relation Age of Onset  . Diabetes Father   . Hypertension Father   . Hypertension Paternal Grandfather   . Alcohol abuse Paternal Grandfather   . Alcohol abuse Maternal Grandfather   . Alcohol abuse Maternal Grandmother   . Alcohol abuse Paternal Grandmother     Social History:  reports that he has been smoking Cigars.  He does not have any smokeless tobacco history on file. He reports that he drinks about 30.0 ounces of alcohol per week. He reports that he does not use illicit drugs.  Additional Social History:  Alcohol / Drug Use Pain Medications: See MAR  Prescriptions: See MAR  Over the Counter: See MAR History of alcohol / drug use?: Yes Longest period of sobriety (when/how long): Only when in detox  Negative  Consequences of Use: Work / School;Personal relationships;Financial Withdrawal Symptoms: Other (Comment) (Anxiety ) Substance #1 Name of Substance 1: Alcohol--Beer 1 - Age of First Use: 16 YOM  1 - Amount (size/oz): 3-4 40's  1 - Frequency: Daily  1 - Duration: On-going  1 - Last Use / Amount: 01/18/14  CIWA: CIWA-Ar BP: 130/79 mmHg Pulse Rate: 90 COWS:    Allergies: No Known Allergies  Home Medications:  (Not in a hospital admission)  OB/GYN Status:  No LMP for male patient.  General Assessment Data Location of Assessment: [facility] ED Is this a Tele or Face-to-Face Assessment?: Tele Assessment Is this an Initial Assessment or a Re-assessment for this encounter?: Initial Assessment Living Arrangements: Alone Can pt return to current living arrangement?: Yes Admission Status: Voluntary Is patient capable of signing voluntary admission?: Yes Transfer from: [facility] Referral Source: MD  Medical Screening Exam ([place]) Medical Exam completed: No Reason for MSE not completed: Other: (None )  [place] Living Arrangements: Alone Name of Psychiatrist: None  Name of Therapist: None   Education Status Is patient currently in school?: No Current Grade: None  Highest grade of school patient has completed: None  Name of school: None  Contact person: None  Risk to self Suicidal Ideation: No Suicidal Intent: No Is patient at risk for suicide?: No Suicidal Plan?: No Access to Means: No What has been your use of drugs/alcohol within the last 12 months?: Abusing: alcohol  Previous Attempts/Gestures: No How many times?: 0 Other Self Harm Risks: None  Triggers for Past Attempts: None known Intentional Self Injurious Behavior: None Family Suicide History: No Recent stressful life event(s): Other (Comment) (Medical issues; SA relapse ) Persecutory voices/beliefs?: No Depression: Yes Depression Symptoms: Loss of interest in usual pleasures Substance  abuse history and/or treatment for substance abuse?: Yes Suicide prevention information given to non-admitted patients: Not applicable  Risk to Others Homicidal Ideation: No Thoughts of Harm to Others: No Current Homicidal Intent: No Current Homicidal Plan: No Access to Homicidal Means: No Identified Victim: None  History of harm to others?: No Assessment of Violence: None Noted Violent Behavior Description: None  Does patient have access to weapons?: No Criminal Charges Pending?: No Does patient have a court date: No  Psychosis Hallucinations: None noted Delusions: None noted  Mental Status Report Appear/Hygiene: Other (Comment) (Appropriate ) Eye Contact: Fair Motor Activity: Unremarkable Speech: Logical/coherent Level of Consciousness: Alert Mood: Sad Affect: Sad Anxiety Level: None Thought Processes: Coherent;Relevant Judgement: Unimpaired Orientation: Person;Place;Time;Situation Obsessive Compulsive Thoughts/Behaviors: None  Cognitive Functioning Concentration: Decreased Memory: Recent Intact;Remote Intact IQ: Average Insight: Fair Impulse Control: Fair Appetite: Fair Weight Loss: 0 Weight Gain: 0 Sleep: Decreased Total Hours of Sleep: 5 Vegetative Symptoms: None  ADLScreening [facility] Assessment Services) Patient's cognitive ability adequate to safely complete daily activities?: Yes Patient able to express need for assistance with ADLs?: Yes Independently performs ADLs?: Yes (appropriate for developmental age)  Prior Inpatient Therapy Prior Inpatient Therapy: Yes Prior Therapy Dates: 2014,2009,2007 Prior Therapy Facilty/Provider(s): [facility], [facility]  Reason for Treatment: Detox/Rehab   Prior Outpatient Therapy Prior Outpatient Therapy: Yes Prior Therapy Dates: Current  Prior Therapy Facilty[facility]Provider([facility]): [facility]  Reason for Treatment: Med mgt/Therapy   ADL Screening (condition at time of admission) Patient's cognitive ability adequate to  safely complete daily activities?: Yes Is the patient deaf or have difficulty hearing?: No Does the patient have difficulty seeing, even when wearing glasses/contacts?: No Does the patient have difficulty concentrating, remembering, or making decisions?: No Patient able to express need for assistance with ADLs?: Yes Does the patient have difficulty dressing or bathing?: No Independently performs ADLs?: Yes (appropriate for developmental age) Does the patient have difficulty walking or climbing stairs?: No Weakness of Legs: None Weakness of Arms/Hands: None  Home Assistive Devices/Equipment Home Assistive Devices/Equipment: None  Therapy Consults (therapy consults require a physician order) PT Evaluation Needed: No OT Evalulation Needed: No SLP Evaluation Needed: No Abuse/Neglect Assessment (Assessment to be complete while patient is alone) Physical Abuse: Denies Verbal Abuse: Denies Sexual Abuse: Denies Exploitation of patient/patient's resources: Denies Self-Neglect: Denies Values / Beliefs Cultural Requests During Hospitalization: None Spiritual Requests During Hospitalization: None Consults Spiritual Care Consult Needed: No Social Work Consult Needed: No Regulatory affairs officer (For Healthcare) Advance Directive: Patient does not have advance directive;Patient would not like information Pre-existing out of facility DNR order (yellow form or pink MOST form): No Nutrition Screen- [facility] Patient's home diet: Regular  Additional Information 1:1 In Past 12 Months?: No CIRT Risk: No Elopement Risk: No Does patient have medical clearance?: No     Disposition:  Disposition Initial Assessment Completed for this Encounter: Yes Disposition of Patient: Inpatient treatment program;Referred to (No beds avail TTS will seek other placement ) Type of  inpatient treatment program: Adult Patient referred to: Other (Comment) (Inpt admit--TTS will seek placement )  On Site  Evaluation by:   Reviewed with Physician:    Girtha Rm 01/19/2014 12:57 AM

## 2014-01-19 NOTE — ED Notes (Signed)
Report called to Laqueta Due, RN at [facility].  Patient informed he will be transferred this evening.

## 2014-01-19 NOTE — [facility] Counselor (Addendum)
Melissa at [facility] states they have detox beds. Writer faxed referral.  Arnold Long, [place] Assessment Counselor - noon  Writer left voicmail for [employer], admission coordinator at [facility].  Arnold Long, [place] Assessment Counselor - 10:00 am

## 2014-01-19 NOTE — Tx Team (Signed)
Initial Interdisciplinary Treatment Plan  PATIENT STRENGTHS: (choose at least two) Ability for insight Average or above average intelligence Capable of independent living Communication skills General fund of knowledge Motivation for treatment/growth Supportive family/friends Work skills  PATIENT STRESSORS: Medication change or noncompliance Substance abuse   PROBLEM LIST: Problem List/Patient Goals Date to be addressed Date deferred Reason deferred Estimated date of resolution  ETOH detox 01/19/2013     anxiety 01/19/2013     Medication non-compliance 01/19/2013                                          DISCHARGE CRITERIA:  Ability to meet basic life and health needs Improved stabilization in mood, thinking, and/or behavior Motivation to continue treatment in a less acute level of care Need for constant or close observation no longer present Verbal commitment to aftercare and medication compliance Withdrawal symptoms are absent or subacute and managed without 24-hour nursing intervention  PRELIMINARY DISCHARGE PLAN: Attend aftercare/continuing care group Attend 12-step recovery group Participate in family therapy Return to previous living arrangement  PATIENT/FAMIILY INVOLVEMENT: This treatment plan has been presented to and reviewed with the patient, [name] [name].  The patient and family have been given the opportunity to ask questions and make suggestions.  [name] 01/19/2014, 7:55 PM

## 2014-01-19 NOTE — Progress Notes (Signed)
Adult Psychoeducational Group Note  Date:  01/19/2014 Time:  9:53 PM  Group Topic/Focus:  Wrap-Up Group:   The focus of this group is to help patients review their daily goal of treatment and discuss progress on daily workbooks.  Participation Level:  Active  Participation Quality:  Appropriate  Affect:  Appropriate  Cognitive:  Appropriate  Insight: Appropriate  Engagement in Group:  Engaged  Modes of Intervention:  Support  Additional Comments:  Pt stated that most positive thing that happened today was that he was able to get a bed here. He states that he hopes to learn coping skills here and how to deal with his "alcohol issue" Pt was given support and ensured that he would learn plenty of coping skills.   [name], [name] 01/19/2014, 9:53 PM

## 2014-01-19 NOTE — ED Notes (Signed)
Pt has three personal belongings bags transported to psych ed with pt.

## 2014-01-19 NOTE — Progress Notes (Signed)
Patient ID: [name] [name], male   DOB: 09-13-1961, 53 y.o.   MRN: [id number] Pt admitted voluntarily to [facility] for ETOH detox.  Pt has been admitted to [facility] in the past.   Pt reported he had been drinking 3 1/2 40 oz beers daily since December.  Pt reported his stressor was related to his workman's comp case (carpal tunnel).  Pt stated he also suffers from chronic anxiety.  Pt stated he believes that his anxiety stems from the fact that he stutters.  Pt reported that he remembers that when he was in school he remembers become very anxious whenever called on by the teacher to stand in front of the class and talk d/t this disability.  Pt stated he first began to drink at the age of 49 and found that it helped him relax.  Pt's longest length of sobriety was 5 months.  Pt denies SI, HI and AVH.  Fifteen minute checks in progress. Pt safe on unit.

## 2014-01-19 NOTE — Consult Note (Signed)
[facility] Consult   Reason for Consult:  Alcohol use Referring Physician:  ED physician  [name] [name] is an 53 y.o. male. Total Time spent with patient: 30 minutes  Assessment: AXIS I:  Substance Induced Mood Disorder and Alcohol use disorder, severe AXIS II:  Deferred AXIS III:   Past Medical History  Diagnosis Date  . Alcohol abuse   . Sarcoidosis of lung   . Reflux   . Depression   . Anxiety   . Pancreatitis   . Carpal tunnel syndrome of right wrist    AXIS IV:  economic problems, other psychosocial or environmental problems, problems related to social environment and problems with primary support group AXIS V:  41-50 serious symptoms  Plan:  Recommend psychiatric Inpatient admission when medically cleared. Admission for detox.  Subjective:   [name] [name] is a 53 y.o. male patient admitted with using alcohol and wants detox.  HPI:  Patient presents with relapse on alcohol. Says has been sober for a while but started using now 40 ounces upto 3 a day. Has been feeling depressed and other cirumstances made him to relapse and non compliant with outpatient services and medications.   Endorses he starts shaking if he is not drinking and difficult to stop drinking. Has to drink more to get the same effect.  HPI Elements:   Location:  using alcohol. Quality:  moderate. Severity:  recurrent.  Past Psychiatric History: Past Medical History  Diagnosis Date  . Alcohol abuse   . Sarcoidosis of lung   . Reflux   . Depression   . Anxiety   . Pancreatitis   . Carpal tunnel syndrome of right wrist     reports that he has been smoking Cigars.  He does not have any smokeless tobacco history on file. He reports that he drinks about 30.0 ounces of alcohol per week. He reports that he does not use illicit drugs. Family History  Problem Relation Age of Onset  . Diabetes Father   . Hypertension Father   . Hypertension Paternal Grandfather   . Alcohol abuse Paternal  Grandfather   . Alcohol abuse Maternal Grandfather   . Alcohol abuse Maternal Grandmother   . Alcohol abuse Paternal Grandmother    Family History Substance Abuse: No Family Supports: No Living Arrangements: Alone Can pt return to current living arrangement?: Yes Abuse/Neglect [facility]) Physical Abuse: Denies Verbal Abuse: Denies Sexual Abuse: Denies Allergies:  No Known Allergies  ACT Assessment Complete:  Yes:    Educational Status    Risk to Self: Risk to self Suicidal Ideation: No Suicidal Intent: No Is patient at risk for suicide?: No Suicidal Plan?: No Access to Means: No What has been your use of drugs/alcohol within the last 12 months?: Abusing: alcohol  Previous Attempts/Gestures: No How many times?: 0 Other Self Harm Risks: None  Triggers for Past Attempts: None known Intentional Self Injurious Behavior: None Family Suicide History: No Recent stressful life event(s): Other (Comment) (Medical issues; SA relapse ) Persecutory voices/beliefs?: No Depression: Yes Depression Symptoms: Loss of interest in usual pleasures Substance abuse history and/or treatment for substance abuse?: Yes Suicide prevention information given to non-admitted patients: Not applicable  Risk to Others: Risk to Others Homicidal Ideation: No Thoughts of Harm to Others: No Current Homicidal Intent: No Current Homicidal Plan: No Access to Homicidal Means: No Identified Victim: None  History of harm to others?: No Assessment of Violence: None Noted Violent Behavior Description: None  Does patient have access to weapons?:  No Criminal Charges Pending?: No Does patient have a court date: No  Abuse: Abuse/Neglect Assessment (Assessment to be complete while patient is alone) Physical Abuse: Denies Verbal Abuse: Denies Sexual Abuse: Denies Exploitation of patient/patient's resources: Denies Self-Neglect: Denies  Prior Inpatient Therapy: Prior Inpatient Therapy Prior Inpatient Therapy:  Yes Prior Therapy Dates: 2014,2009,2007 Prior Therapy Facilty/Provider(s): [facility], [facility], [facility]  Reason for Treatment: Detox/Rehab   Prior Outpatient Therapy: Prior Outpatient Therapy Prior Outpatient Therapy: Yes Prior Therapy Dates: Current  Prior Therapy Facilty/Provider(s): [facility]  Reason for Treatment: Med mgt/Therapy   Additional Information: Additional Information 1:1 In Past 12 Months?: No CIRT Risk: No Elopement Risk: No Does patient have medical clearance?: No                  Objective: Blood pressure 121/68, pulse 95, temperature 97.8 F (36.6 C), temperature source Oral, resp. rate 20, height _0  (1.753 m), weight 66.395 kg (146 lb 6 oz), SpO2 98.00%.Body mass index is 21.61 kg/(m^2). Results for orders placed during the hospital encounter of 01/18/14 (from the past 72 hour(s))  CBC WITH DIFFERENTIAL     Status: Abnormal   Collection Time    01/18/14  9:15 PM      Result Value Ref Range   WBC 6.0  4.0 - 10.5 K/uL   RBC 5.19  4.22 - 5.81 MIL/uL   Hemoglobin 14.0  13.0 - 17.0 g/dL   HCT 42.2  39.0 - 52.0 %   MCV 81.3  78.0 - 100.0 fL   MCH 27.0  26.0 - 34.0 pg   MCHC 33.2  30.0 - 36.0 g/dL   RDW 16.2 (*) 11.5 - 15.5 %   Platelets 179  150 - 400 K/uL   Neutrophils Relative % 43  43 - 77 %   Neutro Abs 2.6  1.7 - 7.7 K/uL   Lymphocytes Relative 42  12 - 46 %   Lymphs Abs 2.5  0.7 - 4.0 K/uL   Monocytes Relative 13 (*) 3 - 12 %   Monocytes Absolute 0.8  0.1 - 1.0 K/uL   Eosinophils Relative 1  0 - 5 %   Eosinophils Absolute 0.1  0.0 - 0.7 K/uL   Basophils Relative 0  0 - 1 %   Basophils Absolute 0.0  0.0 - 0.1 K/uL  COMPREHENSIVE METABOLIC PANEL     Status: Abnormal   Collection Time    01/18/14  9:15 PM      Result Value Ref Range   Sodium 138  137 - 147 mEq/L   Potassium 4.0  3.7 - 5.3 mEq/L   Chloride 96  96 - 112 mEq/L   CO2 25  19 - 32 mEq/L   Glucose, Bld 108 (*) 70 - 99 mg/dL   BUN 6  6 - 23 mg/dL   Creatinine, Ser 1.00  0.50 -  1.35 mg/dL   Calcium 9.1  8.4 - 10.5 mg/dL   Total Protein 9.1 (*) 6.0 - 8.3 g/dL   Albumin 4.3  3.5 - 5.2 g/dL   AST 54 (*) 0 - 37 U/L   ALT 39  0 - 53 U/L   Alkaline Phosphatase 45  39 - 117 U/L   Total Bilirubin 0.4  0.3 - 1.2 mg/dL   GFR calc non Af Amer 85 (*) >90 mL/min   GFR calc Af Amer >90  >90 mL/min   Comment: (NOTE)     The eGFR has been calculated using the CKD EPI equation.  This calculation has not been validated in all clinical situations.     eGFR's persistently <90 mL/min signify possible Chronic Kidney     Disease.  LIPASE, BLOOD     Status: Abnormal   Collection Time    01/18/14  9:15 PM      Result Value Ref Range   Lipase 129 (*) 11 - 59 U/L  ETHANOL     Status: Abnormal   Collection Time    01/18/14  9:15 PM      Result Value Ref Range   Alcohol, Ethyl (B) 328 (*) 0 - 11 mg/dL   Comment:            LOWEST DETECTABLE LIMIT FOR     SERUM ALCOHOL IS 11 mg/dL     FOR MEDICAL PURPOSES ONLY  URINALYSIS, ROUTINE W REFLEX MICROSCOPIC     Status: None   Collection Time    01/18/14  9:20 PM      Result Value Ref Range   Color, Urine YELLOW  YELLOW   APPearance CLEAR  CLEAR   Specific Gravity, Urine 1.006  1.005 - 1.030   pH 6.0  5.0 - 8.0   Glucose, UA NEGATIVE  NEGATIVE mg/dL   Hgb urine dipstick NEGATIVE  NEGATIVE   Bilirubin Urine NEGATIVE  NEGATIVE   Ketones, ur NEGATIVE  NEGATIVE mg/dL   Protein, ur NEGATIVE  NEGATIVE mg/dL   Urobilinogen, UA 0.2  0.0 - 1.0 mg/dL   Nitrite NEGATIVE  NEGATIVE   Leukocytes, UA NEGATIVE  NEGATIVE   Comment: MICROSCOPIC NOT DONE ON URINES WITH NEGATIVE PROTEIN, BLOOD, LEUKOCYTES, NITRITE, OR GLUCOSE <1000 mg/dL.  URINE RAPID DRUG SCREEN (HOSP PERFORMED)     Status: None   Collection Time    01/18/14  9:20 PM      Result Value Ref Range   Opiates NONE DETECTED  NONE DETECTED   Cocaine NONE DETECTED  NONE DETECTED   Benzodiazepines NONE DETECTED  NONE DETECTED   Amphetamines NONE DETECTED  NONE DETECTED    Tetrahydrocannabinol NONE DETECTED  NONE DETECTED   Barbiturates NONE DETECTED  NONE DETECTED   Comment:            DRUG SCREEN FOR MEDICAL PURPOSES     ONLY.  IF CONFIRMATION IS NEEDED     FOR ANY PURPOSE, NOTIFY LAB     WITHIN 5 DAYS.                LOWEST DETECTABLE LIMITS     FOR URINE DRUG SCREEN     Drug Class       Cutoff (ng/mL)     Amphetamine      1000     Barbiturate      200     Benzodiazepine   371     Tricyclics       696     Opiates          300     Cocaine          300     THC              50   Labs are reviewed and are pertinent for alcohol level 328.  Current Facility-Administered Medications  Medication Dose Route Frequency Provider Last Rate Last Dose  . acetaminophen (TYLENOL) tablet 650 mg  650 mg Oral Q4H PRN Malvin Johns, MD      . acetaminophen (TYLENOL) tablet 650 mg  650 mg Oral Q4H PRN Lennette Bihari  Rocco Serene, MD      . ibuprofen (ADVIL,MOTRIN) tablet 600 mg  600 mg Oral Q8H PRN Hoy Morn, MD      . LORazepam (ATIVAN) tablet 0-4 mg  0-4 mg Oral 4 times per day Malvin Johns, MD   1 mg at 01/19/14 0029   Followed by  . [START ON 01/21/2014] LORazepam (ATIVAN) tablet 0-4 mg  0-4 mg Oral Q12H Malvin Johns, MD      . ondansetron (ZOFRAN) tablet 4 mg  4 mg Oral Q8H PRN Malvin Johns, MD   4 mg at 01/19/14 0231  . ondansetron (ZOFRAN) tablet 4 mg  4 mg Oral Q8H PRN Hoy Morn, MD      . zolpidem [facility]) tablet 5 mg  5 mg Oral QHS PRN Hoy Morn, MD   5 mg at 01/19/14 0306   Current Outpatient Prescriptions  Medication Sig Dispense Refill  . escitalopram (LEXAPRO) 10 MG tablet Take 1 tablet (10 mg total) by mouth daily.  30 tablet  2  . vitamin B-12 (CYANOCOBALAMIN) 100 MCG tablet Take 100 mcg by mouth daily.      . vitamin C (ASCORBIC ACID) 250 MG tablet Take 250 mg by mouth daily.      Marland Kitchen gabapentin (NEURONTIN) 100 MG capsule Take 2 capsules (200 mg total) by mouth 3 (three) times daily. For mood stabilization and neuropathic pain.  180 capsule  0  .  hydrOXYzine (VISTARIL) 25 MG capsule Take 1 capsule (25 mg total) by mouth every 4 (four) hours as needed for anxiety.  90 capsule  0  . traZODone (DESYREL) 100 MG tablet Take 1 tablet (100 mg total) by mouth at bedtime. For insomnia.  30 tablet  0    Psychiatric Specialty Exam:     Blood pressure 121/68, pulse 95, temperature 97.8 F (36.6 C), temperature source Oral, resp. rate 20, height _0  (1.753 m), weight 66.395 kg (146 lb 6 oz), SpO2 98.00%.Body mass index is 21.61 kg/(m^2).  General Appearance: Casual  Eye Contact::  Minimal  Speech:  Slow  Volume:  Decreased  Mood:  Dysphoric  Affect:  Constricted  Thought Process:  Linear  Orientation:  Full (Time, Place, and Person)  Thought Content:  Rumination  Suicidal Thoughts:  No  Homicidal Thoughts:  No  Memory:  Recent;   Fair  Judgement:  Impaired  Insight:  Shallow  Psychomotor Activity:  Decreased  Concentration:  Fair  Recall:  [place]: Fair  Akathisia:  Negative  Handed:  Right  AIMS (if indicated):     Assets:  Desire for Improvement  Sleep:      Musculoskeletal: Strength & Muscle Tone: within normal limits Gait & Station: normal Patient leans: N/A  Treatment Plan Summary: Daily contact with patient to assess and evaluate symptoms and progress in treatment Medication management Admit for detox. Detox protocol started. Monitor labs and vitals.Start lexapro 28m qd.  ADe Nurse NADEEM MD 01/19/2014 11:04 AM

## 2014-01-19 NOTE — Progress Notes (Signed)
[facility] initiated bed placement at detox facility with bed availability.  The following hospitals were contact regarding detox beds:  1)[facility]-faxed referral for AM 2)[facility]-faxed referral 3)[facility]-Med[facility] and [facility] only 4)[facility]-faxed referral 5)[facility]-no beds 6)[facility]-faxed referral  Wyvonnia Dusky, MHT/NS

## 2014-01-20 ENCOUNTER — Encounter (HOSPITAL_COMMUNITY): Payer: Self-pay | Admitting: Family

## 2014-01-20 DIAGNOSIS — F10988 Alcohol use, unspecified with other alcohol-induced disorder: Secondary | ICD-10-CM

## 2014-01-20 DIAGNOSIS — F332 Major depressive disorder, recurrent severe without psychotic features: Secondary | ICD-10-CM

## 2014-01-20 LAB — LIPASE, BLOOD: Lipase: 114 U/L — ABNORMAL HIGH (ref 11–59)

## 2014-01-20 LAB — PROTIME-INR
INR: 0.97 (ref 0.00–1.49)
Prothrombin Time: 12.7 seconds (ref 11.6–15.2)

## 2014-01-20 MED ORDER — DOXEPIN HCL 25 MG PO CAPS
25.0000 mg | ORAL_CAPSULE | Freq: Every evening | ORAL | Status: DC | PRN
Start: 2014-01-20 — End: 2014-01-23
  Administered 2014-01-21 – 2014-01-22 (×2): 25 mg via ORAL
  Filled 2014-01-20 (×2): qty 1
  Filled 2014-01-20: qty 6

## 2014-01-20 MED ORDER — ENSURE COMPLETE PO LIQD
237.0000 mL | Freq: Two times a day (BID) | ORAL | Status: DC
  Administered 2014-01-20 – 2014-01-22 (×3): 237 mL via ORAL

## 2014-01-20 NOTE — Progress Notes (Signed)
D: pt has been isolative today, staying in room. Pt stated he has been having a rough day with his withdrawals, feeling nauseous and having tremors, and also feeling some agitation. Pt stayed back from dinner this evening due to feeling a panic attack coming on. Pt has not attended any groups today. Pt is cooperative and calm. Pleasant on approach. Denies si/hi/avh. denies pain.  A: Zofran given for nausea along with ginger ale. Librium scheduled for withdrawals. q 15 min safety checks R: pt remains safe on unit. No further complaints at this time.

## 2014-01-20 NOTE — Progress Notes (Signed)
NUTRITION ASSESSMENT  Pt identified as at risk on the Malnutrition Screen Tool  INTERVENTION: 1. Educated patient on the importance of nutrition and encouraged intake of food and beverages. 2. Supplements: Ensure Complete BID 3. Anti-emetics per MD  NUTRITION DIAGNOSIS: Unintentional weight loss related to sub-optimal intake as evidenced by pt report.   Goal: Pt to meet >/= 90% of their estimated nutrition needs.  Monitor:  PO intake  Assessment:  Admitted for alcohol detox. Pt reported on admission that he had been drinking 3 1/2 40 oz beers daily since December. Met with pt who reports poor appetite for the past 1.5 months with 15 pound unintended weight loss in the past 2 months. States he has been forcing himself to eat 2 meals/day of things like pre-prepared pastas and raviolis at the grocery store. C/o a lot of nausea today and poor appetite since admission. Agreeable to trying Ensure Complete.    53 y.o. male  Height: Ht Readings from Last 1 Encounters:  01/19/14 '5\' 9"'  (1.753 m)    Weight: Wt Readings from Last 1 Encounters:  01/19/14 141 lb (63.957 kg)    Weight Hx: Wt Readings from Last 10 Encounters:  01/19/14 141 lb (63.957 kg)  01/18/14 146 lb 6 oz (66.395 kg)  09/01/13 135 lb (61.236 kg)  09/01/13 140 lb (63.504 kg)  07/10/13 140 lb (63.504 kg)  07/01/12 130 lb (58.968 kg)    BMI:  Body mass index is 20.81 kg/(m^2). Pt meets criteria for normal weight based on current BMI.  Estimated Nutritional Needs: Kcal: 25-30 kcal/kg Protein: > 1 gram protein/kg Fluid: 1 ml/kcal  Diet Order: General Pt is also offered choice of unit snacks mid-morning and mid-afternoon.  Pt is eating as desired.   Lab results and medications reviewed. Lipase and AST elevated. Getting daily multivitamin and thiamine.   Mikey College MS, [place], [place] Pager [phone] After Hours Pager

## 2014-01-20 NOTE — Progress Notes (Signed)
Patient ID: [name] [name], male   DOB: Apr 13, 1961, 53 y.o.   MRN: [id number] D: Pt. Requesting medication. A: Writer introduced self to client, reviewed medication regime. Staff will monitor q75min for safety. R: Pt. Takes medication without incidence. Pt. Is safe on the unit.

## 2014-01-20 NOTE — H&P (Signed)
Psychiatric Admission Assessment Adult  Patient Identification:  [name] [name] Date of Evaluation:  01/20/2014 Chief Complaint:  ALCOHOL DEPENDENCE History of Present Illness::  Patient presents with relapse on alcohol. Says has been sober for a while but started using now 40 ounces upto 3 a day. Has been feeling depressed and other cirumstances made him to relapse and non compliant with outpatient services and medications.    During admission assessment, pt rates depression at 2-3/10 and anxiety at 8/10. Pt denies SI, HI, and AVH. Pt contracts for safety. Pt states that he has relapsed on alcohol when he has been very anxious, yet denies triggers at this time. Pt is participating in groups and is receiving benefit from this. Pt states that he is in agreement with medication and treatment plan, although his sleep is poor.   Elements:  Location:  Generalized, Inpatient. Quality:  Severe. Severity:  Severe, improving. Timing:  Constant. Duration:  chronic. Associated Signs/Synptoms: Depression Symptoms:  depressed mood, anhedonia, insomnia, psychomotor retardation, fatigue, feelings of worthlessness/guilt, difficulty concentrating, hopelessness, anxiety, panic attacks, loss of energy/fatigue, disturbed sleep, (Hypo) Manic Symptoms:  Denies Anxiety Symptoms:  Excessive Worry, Psychotic Symptoms:  Denies PTSD Symptoms: Denies Total Time spent with patient: Greater than 40 minutes  Psychiatric Specialty Exam: Physical Exam Full Physical Exam performed in ED; reviewed, stable, and I concur with this assessment.   Review of Systems  Constitutional: Negative.   HENT: Negative.   Eyes: Negative.   Respiratory: Negative.   Cardiovascular: Negative.   Gastrointestinal: Negative.   Genitourinary: Negative.   Musculoskeletal: Negative.   Skin: Negative.   Neurological: Negative.   Endo/Heme/Allergies: Negative.   Psychiatric/Behavioral: Positive for depression. Negative for  suicidal ideas and hallucinations. The patient is nervous/anxious and has insomnia.     Blood pressure 140/91, pulse 89, temperature 98.6 F (37 C), temperature source Oral, resp. rate 16, height 5\' 9"  (1.753 m), weight 63.957 kg (141 lb).Body mass index is 20.81 kg/(m^2).  General Appearance: Casual  Eye Contact::  Good  Speech:  Clear and Coherent  Volume:  Normal  Mood:  Anxious  Affect:  Appropriate  Thought Process:  Coherent  Orientation:  Full (Time, Place, and Person)  Thought Content:  WDL  Suicidal Thoughts:  No  Homicidal Thoughts:  No  Memory:  Immediate;   Good Recent;   Good Remote;   Good  Judgement:  Fair  Insight:  Fair  Psychomotor Activity:  Normal  Concentration:  Good  Recall:  Good  Fund of Knowledge:Good  Language: Good  Akathisia:  NA  Handed:  Right  AIMS (if indicated):     Assets:  Communication Skills Desire for Improvement Resilience  Sleep:  Number of Hours: 6    Musculoskeletal: Strength & Muscle Tone: within normal limits Gait & Station: normal Patient leans: N/A  Past Psychiatric History: Diagnosis: MDD, Alcohol Abuse, GAD  Hospitalizations: [facility], [facility]  Outpatient Care: [facility]  Substance Abuse Care: See above  Self-Mutilation: Denies  Suicidal Attempts: Denies  Violent Behaviors: Denies   Past Medical History:   Past Medical History  Diagnosis Date  . Alcohol abuse   . Sarcoidosis of lung   . Reflux   . Depression   . Anxiety   . Pancreatitis   . Carpal tunnel syndrome of right wrist    None. Allergies:  No Known Allergies PTA Medications: Prescriptions prior to admission  Medication Sig Dispense Refill  . escitalopram (LEXAPRO) 10 MG tablet Take 1 tablet (10 mg total) by mouth  daily.  30 tablet  2  . gabapentin (NEURONTIN) 100 MG capsule Take 2 capsules (200 mg total) by mouth 3 (three) times daily. For mood stabilization and neuropathic pain.  180 capsule  0  . hydrOXYzine (VISTARIL) 25 MG capsule Take 1 capsule (25  mg total) by mouth every 4 (four) hours as needed for anxiety.  90 capsule  0  . traZODone (DESYREL) 100 MG tablet Take 1 tablet (100 mg total) by mouth at bedtime. For insomnia.  30 tablet  0  . vitamin B-12 (CYANOCOBALAMIN) 100 MCG tablet Take 100 mcg by mouth daily.      . vitamin C (ASCORBIC ACID) 250 MG tablet Take 250 mg by mouth daily.        Previous Psychotropic Medications:  Medication/Dose  SEE MAR               Substance Abuse History in the last 12 months:  yes  Consequences of Substance Abuse: Medical Consequences:  Hospitalization  Social History:  reports that he has been smoking Cigars.  He does not have any smokeless tobacco history on file. He reports that he drinks alcohol. He reports that he does not use illicit drugs. Additional Social History: History of alcohol / drug use?: Yes Longest period of sobriety (when/how long): 5 months Negative Consequences of Use: Legal;Personal relationships Withdrawal Symptoms: Sweats;Tingling;Tremors;Cramps;Nausea / Vomiting Name of Substance 1: Beer 1 - Age of First Use: 53 years old 1 - Amount (size/oz): 3 1/2 40 oz 1 - Frequency: daily 1 - Duration: beginning 10/2013                  Current Place of Residence:  [place], [place] of Birth:  [place], [place] Family Members: parents and sister Marital Status:  Separated Children:  Sons: 2  Daughters: 0 Relationships: Separated Education:  Some college 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 Diabetes Father   . Hypertension Father   . Hypertension Paternal Grandfather   . Alcohol abuse Paternal Grandfather   .  Alcohol abuse Maternal Grandfather   . Alcohol abuse Maternal Grandmother   . Alcohol abuse Paternal Grandmother     Results for orders placed during the hospital encounter of 01/19/14 (from the past 72 hour(s))  LIPASE, BLOOD     Status: Abnormal   Collection Time    01/20/14  6:15 AM      Result Value Ref Range   Lipase 114 (*) 11 - 59 U/L   Comment: Performed at [facility]     Status: None   Collection Time    01/20/14  6:15 AM      Result Value Ref Range   Prothrombin Time 12.7  11.6 - 15.2 seconds   INR 0.97  0.00 - 1.49   Comment: Performed at [facility]   Psychological Evaluations:  Assessment:   DSM5:  Depressive Disorders:  Major Depressive Disorder - Severe (296.23)  AXIS I:  Alcohol Abuse and Major Depression, Recurrent severe AXIS II:  Deferred AXIS III:   Past Medical History  Diagnosis Date  . Alcohol abuse   . Sarcoidosis of lung   . Reflux   . Depression   . Anxiety   .  Pancreatitis   . Carpal tunnel syndrome of right wrist    AXIS IV:  other psychosocial or environmental problems and problems related to social environment AXIS V:  41-50 serious symptoms  Treatment Plan/Recommendations:   Review of chart, vital signs, medications, and notes.  1-Individual and group therapy  2-Medication management for depression and anxiety: Medications reviewed with the patient and she stated no untoward effects. -Remove Trazodone dt lethargy/hungover feeling -Add Doxepin 25mg  qhs PRN for sleep 3-Coping skills for depression, anxiety  4-Continue crisis stabilization and management  5-Address health issues--monitoring vital signs, stable  6-Treatment plan in progress to prevent relapse of depression and anxiety Treatment Plan Summary: Daily contact with patient to assess and evaluate symptoms and progress in treatment Medication management Current Medications:  Current Facility-Administered Medications   Medication Dose Route Frequency Provider Last Rate Last Dose  . acetaminophen (TYLENOL) tablet 650 mg  650 mg Oral Q6H PRN Laverle Hobby, PA-C      . alum & mag hydroxide-simeth (MAALOX/MYLANTA) 200-200-20 MG/5ML suspension 30 mL  30 mL Oral Q4H PRN Laverle Hobby, PA-C      . chlordiazePOXIDE (LIBRIUM) capsule 25 mg  25 mg Oral Q6H PRN Laverle Hobby, PA-C   25 mg at 01/20/14 [id number]  . chlordiazePOXIDE (LIBRIUM) capsule 25 mg  25 mg Oral QID Laverle Hobby, PA-C   25 mg at 01/20/14 [id number]   Followed by  . [START ON 01/21/2014] chlordiazePOXIDE (LIBRIUM) capsule 25 mg  25 mg Oral TID Laverle Hobby, PA-C       Followed by  . [START ON 01/22/2014] chlordiazePOXIDE (LIBRIUM) capsule 25 mg  25 mg Oral BH-qamhs Spencer E Simon, PA-C       Followed by  . [START ON 01/24/2014] chlordiazePOXIDE (LIBRIUM) capsule 25 mg  25 mg Oral Daily Laverle Hobby, PA-C      . escitalopram (LEXAPRO) tablet 10 mg  10 mg Oral Daily Laverle Hobby, PA-C   10 mg at 01/20/14 [id number]  . gabapentin (NEURONTIN) capsule 200 mg  200 mg Oral TID Laverle Hobby, PA-C   200 mg at 01/20/14 [id number]  . hydrOXYzine (ATARAX/VISTARIL) tablet 25 mg  25 mg Oral Q6H PRN Laverle Hobby, PA-C   25 mg at 01/20/14 [id number]  . loperamide (IMODIUM) capsule 2-4 mg  2-4 mg Oral PRN Laverle Hobby, PA-C      . magnesium hydroxide (MILK OF MAGNESIA) suspension 30 mL  30 mL Oral Daily PRN Laverle Hobby, PA-C      . multivitamin with minerals tablet 1 tablet  1 tablet Oral Daily Laverle Hobby, PA-C   1 tablet at 01/20/14 [phone]  . ondansetron (ZOFRAN-ODT) disintegrating tablet 4 mg  4 mg Oral Q6H PRN Laverle Hobby, PA-C   4 mg at 01/20/14 [id number]  . pneumococcal 23 valent vaccine (PNU-IMMUNE) injection 0.5 mL  0.5 mL Intramuscular Tomorrow-1000 Spencer E Simon, PA-C      . thiamine (VITAMIN B-1) tablet 100 mg  100 mg Oral Daily Laverle Hobby, PA-C   100 mg at 01/20/14 [id number]  . traZODone (DESYREL) tablet 100 mg  100 mg Oral QHS Laverle Hobby, PA-C    100 mg at 01/19/14 [id number]    Observation Level/Precautions:  15 minute checks  Laboratory:  Labs resulted, reviewed, and stable at this time.   Psychotherapy:  Group therapy, individual therapy, psychoeducation  Medications:  See MAR above  Consultations: None    Discharge Concerns: None  Estimated LOS: 5-7 days  Other:  N/A   I certify that inpatient services furnished can reasonably be expected to improve the patient's condition.   Benjamine Mola, [place] 2/24/201511:39 AM  Patient is seen personally for psych evaluation, suicide risk assessment and case discussed with physician extender and formulated treatment plan. Reviewed the information documented and agree with the treatment plan.  JONNALAGADDA,JANARDHAHA R. 01/21/2014 12:26 PM

## 2014-01-20 NOTE — Progress Notes (Signed)
D:  Per pt self inventory pt reports sleeping fair, appetite poor, energy level low, ability to pay attention improving, rates depression at 2 out of 10 and hopelessness at a 1 out of 10, denies SI/HI/AVH, c/o withdrawal s/s.     A:  Emotional support provided, Encouraged pt to continue with treatment plan and attend all group activities, q15 min checks maintained for safety.  R:  Pt is anxious, pleasant with staff, not participating in group at this time today d/t "feeling sick on stomach."

## 2014-01-20 NOTE — [facility] Group Notes (Signed)
Adult Psychoeducational Group Note  Date:  01/20/2014 Time:  0900am  Group Topic/Focus:  Orientation:   The focus of this group is to educate the patient on the purpose and policies of crisis stabilization and provide a format to answer questions about their admission.  The group details unit policies and expectations of patients while admitted.  Participation Level:  Did Not Attend  Wynn Banker 01/20/2014, 1:22 PM

## 2014-01-20 NOTE — [facility] Counselor (Signed)
Adult Psychosocial Assessment Update Interdisciplinary Team  Previous [facility] admissions/discharges:  Admissions Discharges  Date: 09/01/13 Date: 09/05/13  Date: Date:  Date: Date:  Date: Date:  Date: Date:   Changes since the last Psychosocial Assessment (including adherence to outpatient mental health and/or substance abuse treatment, situational issues contributing to decompensation and/or relapse). Pt reports coming to the hospital for alcohol detox.  Pt states that he has been to [facility] before to start services and wants to return home there for outpatient treatment.  Pt reports his alcohol use has increased and uses alcohol to cope with anxiety.  Current stressor is dealing with his workman's comp case.               Discharge Plan 1. Will you be returning to the same living situation after discharge?   Yes: X Pt can return home in [place] where he lives alone. No:      If no, what is your plan?           2. Would you like a referral for services when you are discharged? Yes: X    If yes, for what services? Pt wants to follow up at [facility] for outpatient medication management and therapy.    No:              Summary and Recommendations (to be completed by the evaluator) Patient is a 53 year old African American Male with a diagnosis of Alcohol Use Disorder.  Patient lives in [place] alone.  Patient will benefit from crisis stabilization, medication evaluation, group therapy and psycho education in addition to case management for discharge planning.                         Signature:  Ane Payment, 01/20/2014 8:37 AM

## 2014-01-20 NOTE — [facility] Suicide Risk Assessment (Signed)
   Nursing information obtained from:  Patient Demographic factors:  Male;Living alone;Unemployed Current Mental Status:  NA Loss Factors:  Financial problems / change in socioeconomic status Historical Factors:  Family history of mental illness or substance abuse Risk Reduction Factors:  Positive social support Total Time spent with patient: 45 minutes  CLINICAL FACTORS:   Depression:   Anhedonia Comorbid alcohol abuse/dependence Hopelessness Impulsivity Insomnia Recent sense of peace/wellbeing Severe Alcohol/Substance Abuse/Dependencies Chronic Pain Previous Psychiatric Diagnoses and Treatments Medical Diagnoses and Treatments/Surgeries  Psychiatric Specialty Exam: Physical Exam  Constitutional: He is oriented to person, place, and time. He appears well-developed.  HENT:  Head: Normocephalic.  Eyes: Pupils are equal, round, and reactive to light.  Neck: Normal range of motion.  Cardiovascular: Normal rate.   Respiratory: Effort normal.  GI: There is tenderness.  Musculoskeletal: Normal range of motion.  Neurological: He is alert and oriented to person, place, and time.  Skin: Skin is warm.  Psychiatric: He has a normal mood and affect. His behavior is normal.    Review of Systems  Constitutional: Positive for weight loss.  HENT: Negative.   Eyes: Positive for blurred vision.  Respiratory: Positive for cough.   Cardiovascular: Negative.   Gastrointestinal: Positive for nausea and abdominal pain.  Genitourinary: Negative.   Musculoskeletal: Positive for back pain.  Skin: Negative.   Neurological: Positive for weakness.  Endo/Heme/Allergies: Negative.   Psychiatric/Behavioral: Positive for depression. The patient is nervous/anxious and has insomnia.     Blood pressure 140/91, pulse 89, temperature 98.6 F (37 C), temperature source Oral, resp. rate 16, height 5\' 9"  (1.753 m), weight 63.957 kg (141 lb).Body mass index is 20.81 kg/(m^2).  General Appearance:  Disheveled  Eye Sport and exercise psychologist::  Fair  Speech:  Clear and Coherent  Volume:  Normal  Mood:  Anxious, Depressed, Hopeless and Worthless  Affect:  Depressed and Flat  Thought Process:  Goal Directed and Intact  Orientation:  Full (Time, Place, and Person)  Thought Content:  WDL  Suicidal Thoughts:  No  Homicidal Thoughts:  No  Memory:  Immediate;   Fair  Judgement:  Impaired  Insight:  Lacking  Psychomotor Activity:  Psychomotor Retardation  Concentration:  Fair  Recall:  [place] of Knowledge:Good  Language: Good  Akathisia:  NA  Handed:  Right  AIMS (if indicated):     Assets:  Communication Skills Desire for Improvement Financial Resources/Insurance Housing Leisure Time Resilience Social Support Transportation  Sleep:  Number of Hours: 6   Musculoskeletal: Strength & Muscle Tone: within normal limits Gait & Station: normal Patient leans: N/A  COGNITIVE FEATURES THAT CONTRIBUTE TO RISK:  Closed-mindedness Loss of executive function Polarized thinking    SUICIDE RISK:   Moderate:  Frequent suicidal ideation with limited intensity, and duration, some specificity in terms of plans, no associated intent, good self-control, limited dysphoria/symptomatology, some risk factors present, and identifiable protective factors, including available and accessible social support.  PLAN OF CARE: Admit for alcohol detox treatment and anxiety disorder and He has been admitted with alcohol intoxication.   I certify that inpatient services furnished can reasonably be expected to improve the patient's condition.  JONNALAGADDA,JANARDHAHA R. 01/20/2014, 11:25 AM

## 2014-01-20 NOTE — [facility] Group Notes (Signed)
[facility] LCSW Group Therapy  01/20/2014 1:15 PM   Type of Therapy:  Group Therapy  Participation Level:  Did Not Attend - pt sleeping in his room  Regan Lemming, [place] 01/20/2014 1:58 PM

## 2014-01-21 DIAGNOSIS — F411 Generalized anxiety disorder: Secondary | ICD-10-CM

## 2014-01-21 DIAGNOSIS — F1020 Alcohol dependence, uncomplicated: Secondary | ICD-10-CM

## 2014-01-21 NOTE — Progress Notes (Signed)
Recreation Therapy Notes  Date: 02.25.2015  Time: 2:45pm Location: [address] Dayroom   Group Topic: Understanding mental health   Goal Area(s) Addresses:  Patient will identify benefits to understanding mental health. Patient will work effectively with teammates.   Behavioral Response: Did not attend.    Laureen Ochs [place], LRT/CTRS  Lane Hacker 01/21/2014 8:45 PM

## 2014-01-21 NOTE — Tx Team (Addendum)
Interdisciplinary Treatment Plan Update (Adult)  Date: 01/21/2014  Time Reviewed:  9:45 AM  Progress in Treatment: Attending groups: Yes Participating in groups:  Yes Taking medication as prescribed:  Yes Tolerating medication:  Yes Family/Significant othe contact made: CSW assessing  Patient understands diagnosis:  Yes Discussing patient identified problems/goals with staff:  Yes Medical problems stabilized or resolved:  Yes Denies suicidal/homicidal ideation: Yes Issues/concerns per patient self-inventory:  Yes Other:  New problem(s) identified: N/A  Discharge Plan or Barriers: CSW assessing for appropriate referrals.  Reason for Continuation of Hospitalization: Anxiety Depression Detox Medication Stabilization  Comments: N/A  Estimated length of stay: 3-5 days  For review of initial/current patient goals, please see plan of care.  Attendees: Patient:     Family:     Physician:  Dr. Zorita Pang 01/21/2014 10:27 AM   Nursing:   Eduard Roux, RN 01/21/2014 10:27 AM   Clinical Social Worker:  Regan Lemming, LCSW 01/21/2014 10:27 AM   Other: Catalina Pizza, PA 01/21/2014 10:27 AM   Other:  Gala Romney, care coordination 01/21/2014 10:27 AM   Other:  Joette Catching, LCSW 01/21/2014 10:27 AM   Other:  Gaylan Gerold, RN 01/21/2014 10:27 AM   Other:    Other:    Other:    Other:    Other:      Scribe for Treatment Team:   Ane Payment, 01/21/2014 , 10:27 AM

## 2014-01-21 NOTE — [facility] Group Notes (Signed)
Adult Psychoeducational Group Note  Date:  01/21/2014 Time:  8:50 PM  Group Topic/Focus:  Wrap-Up Group:   The focus of this group is to help patients review their daily goal of treatment and discuss progress on daily workbooks.  Participation Level:  None  Participation Quality:  None  Affect:  None  Cognitive:  None  Insight: None  Engagement in Group:  None  Modes of Intervention:  Discussion  Additional Comments:  Hyder did not attend group.  Victorino Sparrow A 01/21/2014, 8:50 PM

## 2014-01-21 NOTE — [facility] Group Notes (Signed)
[facility] LCSW Group Therapy  01/21/2014  1:15 PM   Type of Therapy:  Group Therapy  Participation Level:  Active  Participation Quality:  Attentive, Sharing and Supportive  Affect:  Depressed and Flat  Cognitive:  Alert and Oriented  Insight:  Developing/Improving and Engaged  Engagement in Therapy:  Developing/Improving and Engaged  Modes of Intervention:  Clarification, Confrontation, Discussion, Education, Exploration, Limit-setting, Orientation, Problem-solving, Rapport Building, Art therapist, Socialization and Support  Summary of Progress/Problems: The topic for group today was emotional regulation.  This group focused on both positive and negative emotion identification and allowed group members to process ways to identify feelings, regulate negative emotions, and find healthy ways to manage internal/external emotions. Group members were asked to reflect on a time when their reaction to an emotion led to a negative outcome and explored how alternative responses using emotion regulation would have benefited them. Group members were also asked to discuss a time when emotion regulation was utilized when a negative emotion was experienced.  Pt shared with peers that he used to let his anxiety and panic attacks control him but has learned to have more control over it by not dwelling on what he can't control and taking a minute to step back and think through the situation.  Pt states that he needs to focus on what he can control and what he can control is his addiction.  Pt was insightful, actively participated and was engaged in group discussion.    Regan Lemming, LCSW 01/21/2014 2:19 PM

## 2014-01-21 NOTE — Progress Notes (Signed)
[facility] MD Progress Note  01/21/2014 1:16 PM [name] [name]  MRN:  [id number] Subjective:  Patient was seen and chart reviewed. Patient has been diagnosed with alcohol dependence and generalized anxiety disorder. Patient was admitted for alcohol detox treatment. Patient has been tolerating his protocol and continued to have a disturbance of sleep, anxiety and stomach upset. Patient is also has a tremors in his both extremities. Patient stated today he is feeling better than yesterday unable to participate few group therapies today. Patient rates his depression as 2/10 and anxiety 5/10. Patient has constant about followup appointment with his hand surgeon which may be coming on Friday. Patient was informed to discuss with his case manager regarding followup appointment if needed to be rescheduled. Diagnosis:   DSM5: Schizophrenia Disorders:   Obsessive-Compulsive Disorders:   Trauma-Stressor Disorders:   Substance/Addictive Disorders:   Depressive Disorders:   Total Time spent with patient: 30 minutes  Axis I: Generalized Anxiety Disorder, Substance Induced Mood Disorder and alcohol dependence  ADL's:  Intact  Sleep: Fair  Appetite:  Poor  Suicidal Ideation:  Patient has denied suicidal ideation and contracts for safety while in the hospital Homicidal Ideation:  denied AEB (as evidenced by):  Psychiatric Specialty Exam: Physical Exam  ROS  Blood pressure 110/74, pulse 105, temperature 98 F (36.7 C), temperature source Oral, resp. rate 20, height 5\' 9"  (1.753 m), weight 63.957 kg (141 lb).Body mass index is 20.81 kg/(m^2).  General Appearance: Disheveled and Guarded  Engineer, water::  Fair  Speech:  Clear and Coherent and Slow  Volume:  Decreased  Mood:  Anxious and Depressed  Affect:  Congruent and Constricted  Thought Process:  Goal Directed and Intact  Orientation:  Full (Time, Place, and Person)  Thought Content:  WDL  Suicidal Thoughts:  No  Homicidal Thoughts:  No  Memory:   Immediate;   Fair  Judgement:  Impaired  Insight:  Lacking  Psychomotor Activity:  Restlessness and Tremor  Concentration:  Fair  Recall:  [employer] of Knowledge:Fair  Language: Good  Akathisia:  NA  Handed:  Right  AIMS (if indicated):     Assets:  Communication Skills Desire for Improvement Physical Health Resilience Social Support  Sleep:  Number of Hours: 6.75   Musculoskeletal: Strength & Muscle Tone: within normal limits Gait & Station: normal Patient leans: N/A  Current Medications: Current Facility-Administered Medications  Medication Dose Route Frequency Provider Last Rate Last Dose  . acetaminophen (TYLENOL) tablet 650 mg  650 mg Oral Q6H PRN Laverle Hobby, PA-C   650 mg at 01/21/14 0847  . alum & mag hydroxide-simeth (MAALOX/MYLANTA) 200-200-20 MG/5ML suspension 30 mL  30 mL Oral Q4H PRN Laverle Hobby, PA-C      . chlordiazePOXIDE (LIBRIUM) capsule 25 mg  25 mg Oral Q6H PRN Laverle Hobby, PA-C   25 mg at 01/20/14 1062  . chlordiazePOXIDE (LIBRIUM) capsule 25 mg  25 mg Oral TID Laverle Hobby, PA-C       Followed by  . [START ON 01/22/2014] chlordiazePOXIDE (LIBRIUM) capsule 25 mg  25 mg Oral BH-qamhs Spencer E Simon, PA-C       Followed by  . [START ON 01/24/2014] chlordiazePOXIDE (LIBRIUM) capsule 25 mg  25 mg Oral Daily Spencer E Simon, PA-C      . doxepin (SINEQUAN) capsule 25 mg  25 mg Oral QHS PRN,MR X 1 John C Withrow, FNP      . escitalopram (LEXAPRO) tablet 10 mg  10 mg Oral  Daily Laverle Hobby, PA-C   10 mg at 01/21/14 0847  . feeding supplement (ENSURE COMPLETE) (ENSURE COMPLETE) liquid 237 mL  237 mL Oral BID BM Christie Beckers, RD   237 mL at 01/20/14 1430  . gabapentin (NEURONTIN) capsule 200 mg  200 mg Oral TID Laverle Hobby, PA-C   200 mg at 01/21/14 1141  . hydrOXYzine (ATARAX/VISTARIL) tablet 25 mg  25 mg Oral Q6H PRN Laverle Hobby, PA-C   25 mg at 01/20/14 2136  . loperamide (IMODIUM) capsule 2-4 mg  2-4 mg Oral PRN Laverle Hobby, PA-C       . magnesium hydroxide (MILK OF MAGNESIA) suspension 30 mL  30 mL Oral Daily PRN Laverle Hobby, PA-C      . multivitamin with minerals tablet 1 tablet  1 tablet Oral Daily Laverle Hobby, PA-C   1 tablet at 01/21/14 0847  . ondansetron (ZOFRAN-ODT) disintegrating tablet 4 mg  4 mg Oral Q6H PRN Laverle Hobby, PA-C   4 mg at 01/21/14 0848  . pneumococcal 23 valent vaccine (PNU-IMMUNE) injection 0.5 mL  0.5 mL Intramuscular Tomorrow-1000 Spencer E Simon, PA-C      . thiamine (VITAMIN B-1) tablet 100 mg  100 mg Oral Daily Laverle Hobby, PA-C   100 mg at 01/21/14 [id number]    Lab Results:  Results for orders placed during the hospital encounter of 01/19/14 (from the past 48 hour(s))  LIPASE, BLOOD     Status: Abnormal   Collection Time    01/20/14  6:15 AM      Result Value Ref Range   Lipase 114 (*) 11 - 59 U/L   Comment: Performed at [facility]     Status: None   Collection Time    01/20/14  6:15 AM      Result Value Ref Range   Prothrombin Time 12.7  11.6 - 15.2 seconds   INR 0.97  0.00 - 1.49   Comment: Performed at [facility]    Physical Findings: AIMS: Facial and Oral Movements Muscles of Facial Expression: None, normal Lips and Perioral Area: None, normal Jaw: None, normal Tongue: None, normal,Extremity Movements Upper (arms, wrists, hands, fingers): None, normal, Trunk Movements Neck, shoulders, hips: None, normal, Overall Severity Severity of abnormal movements (highest score from questions above): None, normal Incapacitation due to abnormal movements: None, normal, Dental Status Current problems with teeth and/or dentures?: No Does patient usually wear dentures?: No  CIWA:  CIWA-Ar Total: 3 COWS:     Treatment Plan Summary: Daily contact with patient to assess and evaluate symptoms and progress in treatment Medication management  Plan: Treatment Plan/Recommendations:   1. Admit for crisis management and  stabilization. 2. Medication management to reduce current symptoms to base line and improve the patient's overall level of functioning.continue Librium protocol for alcohol detox and Lexapro 10 mg for depression and Neurontin for chronic pain and anxiety 3. Treat health problems as indicated. 4. Develop treatment plan to decrease risk of relapse upon discharge and to reduce the need for readmission. 5. Psycho-social education regarding relapse prevention and self care. 6. Health care follow up as needed for medical problems. 7. Restart home medications where appropriate. 8. Disposition plans are in progress and may be discharged on Friday if he continued to show clinical improvement and contracts for safety.   Medical Decision Making Problem Points:  Established problem, worsening (2), New problem, with no additional work-up planned (3)  and Review of psycho-social stressors (1) Data Points:  Review or order clinical lab tests (1) Review or order medicine tests (1) Review of medication regiment & side effects (2) Review of new medications or change in dosage (2)  I certify that inpatient services furnished can reasonably be expected to improve the patient's condition.   JONNALAGADDA,JANARDHAHA R. 01/21/2014, 1:16 PM

## 2014-01-21 NOTE — Progress Notes (Signed)
Patient ID: [name] [name], male   DOB: 1961/10/05, 53 y.o.   MRN: [id number]  D: Pt. Denies SI/HI and A/V Hallucinations. Patient did not fill out daily inventory sheet today.  Patient does report mild discomfort in his stomach and was given zofran and tylenol prn for this and reported a decrease in discomfort.  A: Support and encouragement provided to the patient. Scheduled medications given to patient per physician's orders. Patient reports mild withdrawal symptoms today. Tremors continue but are better from this morning.  R: Patient is receptive and cooperative. Patient is seen in the milieu and is attending groups on the 300 hall. Q15 minute checks are maintained for safety.

## 2014-01-21 NOTE — BHH Group Notes (Signed)
[facility] LCSW Aftercare Discharge Planning Group Note   01/21/2014 8:45 AM  Participation Quality:  Alert, Appropriate and Oriented  Mood/Affect:  Calm  Depression Rating: 2    Anxiety Rating:  6  Thoughts of Suicide:  Pt denies SI/HI  Will you contract for safety?   Yes  Current AVH:  Pt denies  Plan for Discharge/Comments:  Pt attended discharge planning group and actively participated in group.  CSW provided pt with today's workbook.  Pt reports coming to the hospital for alcohol detox.  Pt states that he continues to have nausea as a withdrawal symptom.  Pt states that he will return home in [place] and follow up at [facility] for outpatient medication management and therapy.  No further needs voiced by pt at this time.    Transportation Means: Pt reports access to transportation - has own car here  Supports: No supports mentioned at this time  Regan Lemming, [place] 01/21/2014 10:19 AM

## 2014-01-21 NOTE — BHH Suicide Risk Assessment (Signed)
[place] INPATIENT: Family/Significant Other Suicide Prevention Education   Suicide Prevention Education:  Education Completed; No one has been identified by the patient as the family member/significant other with whom the patient will be residing, and identified as the person(s) who will aid the patient in the event of a mental health crisis (suicidal ideations/suicide attempt).   Pt did not c/o SI at admission, nor have they endorsed SI during their stay here. SPE not required. SPI pamphlet provided to pt and he was encouraged to share information with his support network, ask questions, and talk about any concerns.   The suicide prevention education provided includes the following:  Suicide risk factors  Suicide prevention and interventions  National Suicide Hotline telephone number  [facility] assessment telephone number  [facility] Emergency Assistance [place] and/or Residential Mobile Crisis Unit telephone number  Regan Lemming, [place] 01/21/2014  9:21 AM

## 2014-01-21 NOTE — Progress Notes (Signed)
Patient ID: [name] [name], male   DOB: 16-Sep-1961, 53 y.o.   MRN: [id number] D: Pt. Reports groups been helpful "talked about emotions, anxiety and how it affect you, what we can do to calm ourselves down" A: Writer introduced self to client, encouraged group. Writer provided emotional support encouraged him to use positive coping skills. Staff will monitor q68min for safety. R: Pt. Is safe on the unit. Pt. Attended group.

## 2014-01-21 NOTE — Progress Notes (Signed)
[place] Group Notes:  (Nursing/MHT/Case Management/Adjunct)  Date:  01/21/2014  Time:  12:28 AM  Type of Therapy:  Group Therapy  Participation Level:  Did Not Attend  Participation Quality:  Did Not Attend  Affect:  Did Not Attend  Cognitive:  Did Not Attend  Insight:  None  Engagement in Group:  Did Not Attend  Modes of Intervention:  Socialization and Support  Summary of Progress/Problems: Pt. Stated he was not feeling well and was resting in bed.  Lanell Persons 01/21/2014, 12:28 AM

## 2014-01-21 NOTE — Progress Notes (Signed)
Recreation Therapy Notes  Date: 02.24.2015 Time: 2:45pm Location: 500 Hall Dayroom   Group Topic: Communication, Team Building, Problem Solving  Goal Area(s) Addresses:  Patient will effectively work with peer towards shared goal.  Patient will identify skill used to make activity successful.  Patient will identify how skills used during activity can be used to reach post d/c goals.   Behavioral Response: Did not attend.   Denise L Blanchfield, LRT/CTRS  Blanchfield, Denise L 01/21/2014 1:25 PM

## 2014-01-22 NOTE — Progress Notes (Signed)
Adult Psychoeducational Group Note  Date:  01/22/2014 Time:  8:30 PM  Group Topic/Focus:  Wrap-Up Group:   The focus of this group is to help patients review their daily goal of treatment and discuss progress on daily workbooks.  Participation Level:  Active  Participation Quality:  Appropriate, Attentive, Sharing and Supportive  Affect:  Appropriate  Cognitive:  Alert, Appropriate and Oriented  Insight: Appropriate and Good  Engagement in Group:  Engaged and Supportive  Modes of Intervention:  Discussion and Support  Additional Comments:  Pt shared that in his leisure time that he enjoyed woodworking, listening to music, landscaping and "anything to stay busy". Pt also shared that he was working on his discharge plans.   [name] [name] [name] 01/22/2014, 9:39 PM

## 2014-01-22 NOTE — BHH Group Notes (Signed)
[place] LCSW Group Therapy  01/22/2014  1:15 PM   Type of Therapy:  Group Therapy  Participation Level:  Active  Participation Quality:  Attentive, Sharing and Supportive  Affect:  Calm  Cognitive:  Alert and Oriented  Insight:  Developing/Improving and Engaged  Engagement in Therapy:  Developing/Improving and Engaged  Modes of Intervention:  Clarification, Confrontation, Discussion, Education, Exploration, Limit-setting, Orientation, Problem-solving, Rapport Building, Art therapist, Socialization and Support  Summary of Progress/Problems: The topic for group was balance in life.  Today's group focused on defining balance in one's own words, identifying things that can knock one off balance, and exploring healthy ways to maintain balance in life. Group members were asked to provide an example of a time when they felt off balance, describe how they handled that situation,and process healthier ways to regain balance in the future. Group members were asked to share the most important tool for maintaining balance that they learned while at [facility] and how they plan to apply this method after discharge.  Pt shared that his addiction is what throws him off balance.  Pt states that when he is drinking he can't stay focus or have a clear mind and loses what he is trying to work on.  Pt states that he won't give up and will focus on recovery to get his life back in balance and prevent relapses.  Pt actively participated and was engaged in group discussion.    Regan Lemming, LCSW 01/22/2014 2:23 PM

## 2014-01-22 NOTE — Progress Notes (Signed)
Patient ID: [name] [name], male   DOB: 13-Nov-1961, 53 y.o.   MRN: [id number] [facility] MD Progress Note  01/22/2014 11:23 AM [name] [name]  MRN:  [id number] Subjective:  Patient was seen and chart reviewed. Patient has been diagnosed with alcohol dependence and generalized anxiety disorder. Patient was admitted for alcohol detox treatment.  Patient was seen in his room and has been feeling better today. Patient has been tolerating his protocol and continued to have a disturbance of sleep, anxiety and stomach upset. Patient is also has a tremors in his both extremities. Patient rates his depression as 2/10 and anxiety 3/10. Patient has constant about followup appointment with his hand surgeon which may be coming on Friday. Patient was informed to discuss with his case manager regarding followup appointment.  Diagnosis:   DSM5: Schizophrenia Disorders:   Obsessive-Compulsive Disorders:   Trauma-Stressor Disorders:   Substance/Addictive Disorders:   Depressive Disorders:   Total Time spent with patient: 30 minutes  Axis I: Generalized Anxiety Disorder, Substance Induced Mood Disorder and alcohol dependence  ADL's:  Intact  Sleep: Fair  Appetite:  Poor  Suicidal Ideation:  Patient has denied suicidal ideation and contracts for safety while in the hospital Homicidal Ideation:  denied AEB (as evidenced by):  Psychiatric Specialty Exam: Physical Exam  ROS  Blood pressure 136/86, pulse 80, temperature 97.6 F (36.4 C), temperature source Oral, resp. rate 16, height 5\' 9"  (1.753 m), weight 63.957 kg (141 lb).Body mass index is 20.81 kg/(m^2).  General Appearance: Disheveled and Guarded  Engineer, water::  Fair  Speech:  Clear and Coherent and Slow  Volume:  Decreased  Mood:  Anxious and Depressed  Affect:  Congruent and Constricted  Thought Process:  Goal Directed and Intact  Orientation:  Full (Time, Place, and Person)  Thought Content:  WDL  Suicidal Thoughts:  No  Homicidal Thoughts:  No   Memory:  Immediate;   Fair  Judgement:  Impaired  Insight:  Lacking  Psychomotor Activity:  Restlessness and Tremor  Concentration:  Fair  Recall:  [employer] of Knowledge:Fair  Language: Good  Akathisia:  NA  Handed:  Right  AIMS (if indicated):     Assets:  Communication Skills Desire for Improvement Physical Health Resilience Social Support  Sleep:  Number of Hours: 5.5   Musculoskeletal: Strength & Muscle Tone: within normal limits Gait & Station: normal Patient leans: N/A  Current Medications: Current Facility-Administered Medications  Medication Dose Route Frequency Provider Last Rate Last Dose  . acetaminophen (TYLENOL) tablet 650 mg  650 mg Oral Q6H PRN Laverle Hobby, PA-C   650 mg at 01/21/14 0847  . alum & mag hydroxide-simeth (MAALOX/MYLANTA) 200-200-20 MG/5ML suspension 30 mL  30 mL Oral Q4H PRN Laverle Hobby, PA-C      . chlordiazePOXIDE (LIBRIUM) capsule 25 mg  25 mg Oral Q6H PRN Laverle Hobby, PA-C   25 mg at 01/20/14 1324  . chlordiazePOXIDE (LIBRIUM) capsule 25 mg  25 mg Oral TID Laverle Hobby, PA-C   25 mg at 01/22/14 4010   Followed by  . chlordiazePOXIDE (LIBRIUM) capsule 25 mg  25 mg Oral BH-qamhs Laverle Hobby, PA-C       Followed by  . [START ON 01/24/2014] chlordiazePOXIDE (LIBRIUM) capsule 25 mg  25 mg Oral Daily Spencer E Simon, PA-C      . doxepin (SINEQUAN) capsule 25 mg  25 mg Oral QHS PRN,MR X 1 Benjamine Mola, FNP   25 mg at 01/21/14 2210  .  escitalopram (LEXAPRO) tablet 10 mg  10 mg Oral Daily Laverle Hobby, PA-C   10 mg at 01/22/14 2841  . feeding supplement (ENSURE COMPLETE) (ENSURE COMPLETE) liquid 237 mL  237 mL Oral BID BM Christie Beckers, RD   237 mL at 01/21/14 2000  . gabapentin (NEURONTIN) capsule 200 mg  200 mg Oral TID Laverle Hobby, PA-C   200 mg at 01/22/14 3244  . hydrOXYzine (ATARAX/VISTARIL) tablet 25 mg  25 mg Oral Q6H PRN Laverle Hobby, PA-C   25 mg at 01/20/14 2136  . loperamide (IMODIUM) capsule 2-4 mg  2-4 mg  Oral PRN Laverle Hobby, PA-C      . magnesium hydroxide (MILK OF MAGNESIA) suspension 30 mL  30 mL Oral Daily PRN Laverle Hobby, PA-C      . multivitamin with minerals tablet 1 tablet  1 tablet Oral Daily Laverle Hobby, PA-C   1 tablet at 01/22/14 0102  . ondansetron (ZOFRAN-ODT) disintegrating tablet 4 mg  4 mg Oral Q6H PRN Laverle Hobby, PA-C   4 mg at 01/21/14 0848  . pneumococcal 23 valent vaccine (PNU-IMMUNE) injection 0.5 mL  0.5 mL Intramuscular Tomorrow-1000 Spencer E Simon, PA-C      . thiamine (VITAMIN B-1) tablet 100 mg  100 mg Oral Daily Laverle Hobby, PA-C   100 mg at 01/22/14 7253    Lab Results:  No results found for this or any previous visit (from the past 48 hour(s)).  Physical Findings: AIMS: Facial and Oral Movements Muscles of Facial Expression: None, normal Lips and Perioral Area: None, normal Jaw: None, normal Tongue: None, normal,Extremity Movements Upper (arms, wrists, hands, fingers): None, normal Lower (legs, knees, ankles, toes): None, normal, Trunk Movements Neck, shoulders, hips: None, normal, Overall Severity Severity of abnormal movements (highest score from questions above): None, normal Incapacitation due to abnormal movements: None, normal Patient's awareness of abnormal movements (rate only patient's report): No Awareness, Dental Status Current problems with teeth and/or dentures?: No Does patient usually wear dentures?: No  CIWA:  CIWA-Ar Total: 2 COWS:     Treatment Plan Summary: Daily contact with patient to assess and evaluate symptoms and progress in treatment Medication management  Plan: Treatment Plan/Recommendations:   1. Admit for crisis management and stabilization. 2. Medication management to reduce current symptoms to base line and improve the patient's overall level of functioning.continue  Librium protocol for alcohol detox  Continue Lexapro 10 mg for depression and Neurontin for chronic pain and anxiety 3. Treat health  problems as indicated. 4. Develop treatment plan to decrease risk of relapse upon discharge and to reduce the need for readmission. 5. Psycho-social education regarding relapse prevention and self care. 6. Health care follow up as needed for medical problems. 7. Restart home medications where appropriate. 8. Disposition plans are in progress and may be discharged on Friday if he continued to show clinical improvement and contracts for safety.   Medical Decision Making Problem Points:  Established problem, worsening (2), New problem, with no additional work-up planned (3) and Review of psycho-social stressors (1) Data Points:  Review or order clinical lab tests (1) Review or order medicine tests (1) Review of medication regiment & side effects (2) Review of new medications or change in dosage (2)  I certify that inpatient services furnished can reasonably be expected to improve the patient's condition.   JONNALAGADDA,JANARDHAHA R. 01/22/2014, 11:23 AM

## 2014-01-22 NOTE — Progress Notes (Signed)
Patient ID: [name] [name], male   DOB: October 11, 1961, 53 y.o.   MRN: [id number]  [facility] Group 9:00 A.M.  The focus of this group is to educate the patient on the purpose and policies of crisis stabilization and provide a format to answer questions about their admission.  The group details unit policies and expectations of patients while admitted.  Patient attended group and reported that he likes to make sculptures with wood and likes to landscape for his leisure activity. Patient was attentive during group and gave feedback that it was helpful.

## 2014-01-22 NOTE — Progress Notes (Signed)
Recreation Therapy Notes  Date: 02.26.2015 Time: 2:45pm Location: 500 Hall Dayroom   Group Topic: Leisure Education  Goal Area(s) Addresses:  Patient will identify positive leisure activities.  Patient will identify positive emotions associated with leisure participation.  Patient will identify one positive benefit of participation in leisure activities.   Behavioral Response: Did not attend.   Laureen Ochs Blanchfield, LRT/CTRS  Blanchfield, Denise L 01/22/2014 4:10 PM

## 2014-01-22 NOTE — Progress Notes (Signed)
Patient ID: [name] [name], male   DOB: [birth year], 53 y.o.   MRN: [id number]  D: Pt. Denies SI/HI and A/V Hallucinations. Patient rates his depression and hopelessness at 0/10 for the day. Patient does not report any pain or discomfort at this time. Patient is concerned about discharge tomorrow. Patient states, "I have legal issues" and he reports that he has an appt. Tomorrow at 11 am but does not elaborate to this Probation officer.   A: Support and encouragement provided to the patient. Scheduled medications given to patient per physician's orders.  R: Patient is receptive and cooperative. Patient is seen in the milieu and is going to groups. Q15 minute checks are maintained for safety.

## 2014-01-23 DIAGNOSIS — F329 Major depressive disorder, single episode, unspecified: Secondary | ICD-10-CM | POA: Diagnosis present

## 2014-01-23 DIAGNOSIS — F10939 Alcohol use, unspecified with withdrawal, unspecified: Principal | ICD-10-CM

## 2014-01-23 DIAGNOSIS — F10239 Alcohol dependence with withdrawal, unspecified: Principal | ICD-10-CM

## 2014-01-23 DIAGNOSIS — F1094 Alcohol use, unspecified with alcohol-induced mood disorder: Secondary | ICD-10-CM | POA: Diagnosis present

## 2014-01-23 MED ORDER — ADULT MULTIVITAMIN W/MINERALS CH: 1.0000 | ORAL_TABLET | Freq: Every day | ORAL | Status: DC

## 2014-01-23 MED ORDER — DOXEPIN HCL 25 MG PO CAPS: 25.0000 mg | ORAL_CAPSULE | Freq: Every evening | ORAL | Status: DC | PRN

## 2014-01-23 MED ORDER — VITAMIN C 250 MG PO TABS: 250.0000 mg | ORAL_TABLET | Freq: Every day | ORAL | Status: DC

## 2014-01-23 MED ORDER — GABAPENTIN 100 MG PO CAPS: 200.0000 mg | ORAL_CAPSULE | Freq: Three times a day (TID) | ORAL | Status: DC

## 2014-01-23 MED ORDER — ESCITALOPRAM OXALATE 10 MG PO TABS: 10.0000 mg | ORAL_TABLET | Freq: Every day | ORAL | Status: DC

## 2014-01-23 MED ORDER — VITAMIN B-12 100 MCG PO TABS: 100.0000 ug | ORAL_TABLET | Freq: Every day | ORAL | Status: DC

## 2014-01-23 NOTE — Tx Team (Signed)
Interdisciplinary Treatment Plan Update (Adult)  Date: 01/23/2014  Time Reviewed:  9:45 AM  Progress in Treatment: Attending groups: Yes Participating in groups:  Yes Taking medication as prescribed:  Yes Tolerating medication:  Yes Family/Significant othe contact made: No, N/A Patient understands diagnosis:  Yes Discussing patient identified problems/goals with staff:  Yes Medical problems stabilized or resolved:  Yes Denies suicidal/homicidal ideation: Yes Issues/concerns per patient self-inventory:  Yes Other:  New problem(s) identified: N/A  Discharge Plan or Barriers: Pt will follow up at [facility] for outpatient medication management and therapy.    Reason for Continuation of Hospitalization: Stable to d/c today  Comments: N/A  Estimated length of stay: D/C today  For review of initial/current patient goals, please see plan of care.  Attendees: Patient:  [name] [name]  01/23/2014 10:02 AM   Family:     Physician:  Dr. Zorita Pang 01/23/2014 10:02 AM   Nursing:   Dr. Marilynne Halsted, RN 01/23/2014 10:02 AM   Clinical Social Worker:  Regan Lemming, LCSW 01/23/2014 10:02 AM   Other:  Dorene Ar, RN 01/23/2014 10:02 AM   Other:  Lars Pinks, case manager 01/23/2014 10:02 AM   Other:  Joette Catching, LCSW 01/23/2014 10:02 AM   Other:     Other:    Other:    Other:    Other:    Other:      Scribe for Treatment Team:   Ane Payment, 01/23/2014 , 10:02 AM

## 2014-01-23 NOTE — Discharge Instructions (Signed)
Alcohol Problems °Most adults who drink alcohol drink in moderation (not a lot) are at low risk for developing problems related to their drinking. However, all drinkers, including low-risk drinkers, should know about the health risks connected with drinking alcohol. °RECOMMENDATIONS FOR LOW-RISK DRINKING  °Drink in moderation. Moderate drinking is defined as follows:  °· Men - no more than 2 drinks per day. °· Nonpregnant women - no more than 1 drink per day. °· Over age 65 - no more than 1 drink per day. °A standard drink is 12 grams of pure alcohol, which is equal to a 12 ounce bottle of beer or wine cooler, a 5 ounce glass of wine, or 1.5 ounces of distilled spirits (such as whiskey, brandy, vodka, or rum).  °ABSTAIN FROM (DO NOT DRINK) ALCOHOL: °· When pregnant or considering pregnancy. °· When taking a medication that interacts with alcohol. °· If you are alcohol dependent. °· A medical condition that prohibits drinking alcohol (such as ulcer, liver disease, or heart disease). °DISCUSS WITH YOUR CAREGIVER: °· If you are at risk for coronary heart disease, discuss the potential benefits and risks of alcohol use: Light to moderate drinking is associated with lower rates of coronary heart disease in certain populations (for example, men over age 45 and postmenopausal women). Infrequent or nondrinkers are advised not to begin light to moderate drinking to reduce the risk of coronary heart disease so as to avoid creating an alcohol-related problem. Similar protective effects can likely be gained through proper diet and exercise. °· Women and the elderly have smaller amounts of body water than men. As a result women and the elderly achieve a higher blood alcohol concentration after drinking the same amount of alcohol. °· Exposing a fetus to alcohol can cause a broad range of birth defects referred to as Fetal Alcohol Syndrome (FAS) or Alcohol-Related Birth Defects (ARBD). Although FAS/ARBD is connected with excessive  alcohol consumption during pregnancy, studies also have reported neurobehavioral problems in infants born to mothers reporting drinking an average of 1 drink per day during pregnancy. °· Heavier drinking (the consumption of more than 4 drinks per occasion by men and more than 3 drinks per occasion by women) impairs learning (cognitive) and psychomotor functions and increases the risk of alcohol-related problems, including accidents and injuries. °CAGE QUESTIONS:  °· Have you ever felt that you should Cut down on your drinking? °· Have people Annoyed you by criticizing your drinking? °· Have you ever felt bad or Guilty about your drinking? °· Have you ever had a drink first thing in the morning to steady your nerves or get rid of a hangover (Eye opener)? °If you answered positively to any of these questions: You may be at risk for alcohol-related problems if alcohol consumption is:  °· Men: Greater than 14 drinks per week or more than 4 drinks per occasion. °· Women: Greater than 7 drinks per week or more than 3 drinks per occasion. °Do you or your family have a medical history of alcohol-related problems, such as: °· Blackouts. °· Sexual dysfunction. °· Depression. °· Trauma. °· Liver dysfunction. °· Sleep disorders. °· Hypertension. °· Chronic abdominal pain. °· Has your drinking ever caused you problems, such as problems with your family, problems with your work (or school) performance, or accidents/injuries? °· Do you have a compulsion to drink or a preoccupation with drinking? °· Do you have poor control or are you unable to stop drinking once you have started? °· Do you have to drink to   avoid withdrawal symptoms? °· Do you have problems with withdrawal such as tremors, nausea, sweats, or mood disturbances? °· Does it take more alcohol than in the past to get you high? °· Do you feel a strong urge to drink? °· Do you change your plans so that you can have a drink? °· Do you ever drink in the morning to relieve  the shakes or a hangover? °If you have answered a number of the previous questions positively, it may be time for you to talk to your caregivers, family, and friends and see if they think you have a problem. Alcoholism is a chemical dependency that keeps getting worse and will eventually destroy your health and relationships. Many alcoholics end up dead, impoverished, or in prison. This is often the end result of all chemical dependency. °· Do not be discouraged if you are not ready to take action immediately. °· Decisions to change behavior often involve up and down desires to change and feeling like you cannot decide. °· Try to think more seriously about your drinking behavior. °· Think of the reasons to quit. °WHERE TO GO FOR ADDITIONAL INFORMATION  °· The National Institute on Alcohol Abuse and Alcoholism (NIAAA) °www.niaaa.nih.gov °· National Council on Alcoholism and Drug Dependence (NCADD) °www.ncadd.org °· American Society of Addiction Medicine (ASAM) °www.asam.org  °Document Released: 11/13/2005 Document Revised: 02/05/2012 Document Reviewed: 07/01/2008 °ExitCare® Patient Information ©2014 [employer]. ° °

## 2014-01-23 NOTE — Discharge Summary (Signed)
Physician Discharge Summary Note  Patient:  [name] [name] is an 53 y.o., male MRN:  [id number] DOB:  01-24-1961 Patient phone:  [phone] (home)  Patient address:   [place] [address]   Date of Admission:  01/19/2014 Date of Discharge: 01/23/2014  Reason for Admission:  Alcohol detox/dependency  Discharge Diagnoses: Active Problems:   Alcohol withdrawal   Alcohol dependence   Generalized anxiety disorder   Alcohol abuse   Depression  Axis Diagnosis:  Axis I:  Alcohol dependence, General anxiety disorder, depression Axis II:  None Axis III:  Sarcoidosis of the lung, pancreatitis, carpel tunnel Axis IV:  Psychosocial stressors Axis V:  70; mild symptoms Review of Systems  Constitutional: Negative.   HENT: Negative.   Eyes: Negative.   Respiratory: Negative.   Cardiovascular: Negative.   Gastrointestinal: Negative.   Genitourinary: Negative.   Musculoskeletal:       Carpal tunnel pain  Skin: Negative.   Neurological: Negative.   Endo/Heme/Allergies: Negative.   Psychiatric/Behavioral: Positive for substance abuse.   Level of Care:  OP  Hospital Course:   On admission:  53 y.o. male presenting to [facility] for alcohol detox. Pt denies SI/Hi/AVH. Pt states he relapsed on alcohol, approx 2 mos ago due to medical problems. Pt says he 3-4, daily, last drink was 01/18/14. Pt says he drank just 2-40's, BAL 328. Pt denies any legal issues or problems with seizures or blackouts. Pt is c/o anxiety, no other w/d sxs at this time.  During hospitalization:  Medications managed--[facility] alcohol detox protocol utilized.  His Lexapro 10 mg daily for depression and Gabapentin 200 mg TID for neuropathic pain continued.  His Vistaril 25 mg QID for anxiety and Trazodone 100 mg at bedtime for sleep discontinued.  Doxepin 25 mg at bedtime for sleep started.  Sahid attended and participated in therapy.  He denied suicidal/homicidal ideations and auditory/visual hallucinations, follow-up  appointments encouraged to attend, outside support groups encouraged and information given, Rx given.  Jereld is mentally and physically stable for discharge.  While a patient in this hospital, Valeriano Bain was enrolled in group counseling and activities as well as received the following medication Current facility-administered medications:acetaminophen (TYLENOL) tablet 650 mg, 650 mg, Oral, Q6H PRN, Laverle Hobby, PA-C, 650 mg at 01/21/14 4332;  alum & mag hydroxide-simeth (MAALOX/MYLANTA) 200-200-20 MG/5ML suspension 30 mL, 30 mL, Oral, Q4H PRN, Laverle Hobby, PA-C;  [START ON 01/24/2014] chlordiazePOXIDE (LIBRIUM) capsule 25 mg, 25 mg, Oral, Daily, Spencer E Simon, PA-C doxepin (SINEQUAN) capsule 25 mg, 25 mg, Oral, QHS PRN,MR X 1, Benjamine Mola, FNP, 25 mg at 01/22/14 2146;  escitalopram (LEXAPRO) tablet 10 mg, 10 mg, Oral, Daily, Laverle Hobby, PA-C, 10 mg at 01/23/14 9518;  feeding supplement (ENSURE COMPLETE) (ENSURE COMPLETE) liquid 237 mL, 237 mL, Oral, BID BM, Christie Beckers, RD, 237 mL at 01/22/14 2004 gabapentin (NEURONTIN) capsule 200 mg, 200 mg, Oral, TID, Laverle Hobby, PA-C, 200 mg at 01/23/14 8416;  magnesium hydroxide (MILK OF MAGNESIA) suspension 30 mL, 30 mL, Oral, Daily PRN, Laverle Hobby, PA-C;  multivitamin with minerals tablet 1 tablet, 1 tablet, Oral, Daily, Laverle Hobby, PA-C, 1 tablet at 01/23/14 [phone];  pneumococcal 23 valent vaccine (PNU-IMMUNE) injection 0.5 mL, 0.5 mL, Intramuscular, Tomorrow-1000, Spencer E Simon, PA-C thiamine (VITAMIN B-1) tablet 100 mg, 100 mg, Oral, Daily, Maurine Minister Simon, PA-C, 100 mg at 01/23/14 0160 Current outpatient prescriptions:doxepin (SINEQUAN) 25 MG capsule, Take 1 capsule (25 mg total) by  mouth at bedtime as needed and may repeat dose one time if needed (insomnia)., Disp: 60 capsule, Rfl: 0;  escitalopram (LEXAPRO) 10 MG tablet, Take 1 tablet (10 mg total) by mouth daily., Disp: 30 tablet, Rfl: 0;  gabapentin (NEURONTIN) 100 MG capsule,  Take 2 capsules (200 mg total) by mouth 3 (three) times daily., Disp: 180 capsule, Rfl: 0 Multiple Vitamin (MULTIVITAMIN WITH MINERALS) TABS tablet, Take 1 tablet by mouth daily. Please purchase over the counter in order to keep taking this treatment., Disp: , Rfl: ;  vitamin B-12 (CYANOCOBALAMIN) 100 MCG tablet, Take 1 tablet (100 mcg total) by mouth daily., Disp: , Rfl: ;  vitamin C (ASCORBIC ACID) 250 MG tablet, Take 1 tablet (250 mg total) by mouth daily., Disp: , Rfl:   Patient attended treatment team meeting this am and met with treatment team members. Pt symptoms, treatment plan and response to treatment discussed. Arber Wiemers endorsed that their symptoms have improved. Pt also stated that they are stable for discharge.  In other to control Active Problems:   Alcohol withdrawal   Alcohol dependence   Generalized anxiety disorder   Alcohol abuse   Depression , they will continue psychiatric care on outpatient basis. They will follow-up at  Follow-up Information   Follow up with [facility] On 01/26/2014. (Walk in on this date for hospital discharge appointment.  Walk in clinic is Monday - Friday 8 am - 3 pm.  They will than schedule you for medication management and therapy)    Contact information:   [address], [place] [address] Phone: [phone] Fax: [phone]    .  In addition they were instructed to take all your medications as prescribed by your mental healthcare provider, to report any adverse effects and or reactions from your medicines to your outpatient provider promptly, patient is instructed and cautioned to not engage in alcohol and or illegal drug use while on prescription medicines, in the event of worsening symptoms, patient is instructed to call the crisis hotline, 911 and or go to the nearest ED for appropriate evaluation and treatment of symptoms.   Upon discharge, patient adamantly denies suicidal, homicidal ideations, auditory, visual  hallucinations and or delusional thinking. They left [facility] with all personal belongings in no apparent distress.  Consults:  See electronic record for details  Significant Diagnostic Studies:  See electronic record for details  Discharge Vitals:   Blood pressure 138/88, pulse 94, temperature 97.8 F (36.6 C), temperature source Oral, resp. rate 18, height '5\' 9"'  (1.753 m), weight 141 lb (63.957 kg)..  Mental Status Exam: See Mental Status Examination and Suicide Risk Assessment completed by Attending Physician prior to discharge.  Discharge destination:  Home  Is patient on multiple antipsychotic therapies at discharge:  No  Has Patient had three or more failed trials of antipsychotic monotherapy by history: N/A Recommended Plan for Multiple Antipsychotic Therapies: N/A    Medication List    STOP taking these medications       hydrOXYzine 25 MG capsule  Commonly known as:  VISTARIL     traZODone 100 MG tablet  Commonly known as:  DESYREL      TAKE these medications     Indication   doxepin 25 MG capsule  Commonly known as:  SINEQUAN  Take 1 capsule (25 mg total) by mouth at bedtime as needed and may repeat dose one time if needed (insomnia).   Indication:  Major Depressive Disorder, Insomnia  escitalopram 10 MG tablet  Commonly known as:  LEXAPRO  Take 1 tablet (10 mg total) by mouth daily.   Indication:  Depression     gabapentin 100 MG capsule  Commonly known as:  NEURONTIN  Take 2 capsules (200 mg total) by mouth 3 (three) times daily.   Indication:  Alcohol Withdrawal Syndrome, Neuropathic Pain     multivitamin with minerals Tabs tablet  Take 1 tablet by mouth daily. Please purchase over the counter in order to keep taking this treatment.   Indication:  Vitamin Supplementation     vitamin B-12 100 MCG tablet  Commonly known as:  CYANOCOBALAMIN  Take 1 tablet (100 mcg total) by mouth daily.   Indication:  Inadequate Vitamin B12     vitamin C 250 MG tablet   Commonly known as:  ASCORBIC ACID  Take 1 tablet (250 mg total) by mouth daily.   Indication:  Vitamin Supplementation           Follow-up Information   Follow up with [facility] On 01/26/2014. (Walk in on this date for hospital discharge appointment.  Walk in clinic is Monday - Friday 8 am - 3 pm.  They will than schedule you for medication management and therapy)    Contact information:   [address] [place] [address] Phone: [phone] Fax: [phone]     Follow-up recommendations:   Activities: Resume typical activities Diet: Resume typical diet Tests: none Other: Follow up with outpatient provider and report any side effects to out patient prescriber.  Comments:   Take all your medications as prescribed by your mental healthcare provider. Report any adverse effects and or reactions from your medicines to your outpatient provider promptly. Patient is instructed and cautioned to not engage in alcohol and or illegal drug use while on prescription medicines. In the event of worsening symptoms, patient is instructed to call the crisis hotline, 911 and or go to the nearest ED for appropriate evaluation and treatment of symptoms. Follow-up with your primary care provider for your other medical issues, concerns and or health care needs.  Total Discharge Time: Greater than 30 minutes  Signed: Waylan Boga, PMH-NP 01/23/2014 11:18 AM  Patient was seen for psychiatric evaluation, suicide risk assessment and formulated treatment plan.Case discussed with treatment team, physician extender and discharge summery is completed by Ms. Reita Cliche. Reviewed the information documented and agree with the treatment plan.   JONNALAGADDA,JANARDHAHA R. 01/23/2014 6:33 PM

## 2014-01-23 NOTE — Progress Notes (Signed)
[facility] Adult Case Management Discharge Plan :  Will you be returning to the same living situation after discharge: Yes,  returning home At discharge, do you have transportation home?:Yes,  has own car here Do you have the ability to pay for your medications:Yes,  provided pt with samples and prescriptions and referred pt to [facility] for assistance with affording meds  Release of information consent forms completed and in the chart;  Patient's signature needed at discharge.  Patient to Follow up at: Follow-up Information   Follow up with [facility] On 01/26/2014. (Walk in on this date for hospital discharge appointment.  Walk in clinic is Monday - Friday 8 am - 3 pm.  They will than schedule you for medication management and therapy)    Contact information:   [address], [place] [address] Phone: [phone] Fax: [phone]      Patient denies SI/HI:   Yes,  denies SI/HI    Safety Planning and Suicide Prevention discussed:  Yes,  discussed with pt.  N/A to contact family/friend.  See suicide prevention education note.   Ane Payment 01/23/2014, 9:33 AM

## 2014-01-23 NOTE — BHH Group Notes (Signed)
[facility] LCSW Aftercare Discharge Planning Group Note   01/23/2014 8:45 AM  Participation Quality:  Alert, Appropriate and Oriented  Mood/Affect:  Calm  Depression Rating: 0    Anxiety Rating:  2  Thoughts of Suicide:  Pt denies SI/HI  Will you contract for safety?   Yes  Current AVH:  Pt denies  Plan for Discharge/Comments:  Pt attended discharge planning group and actively participated in group.  CSW provided pt with today's workbook.  Pt reports feeling ready to d/c today.  Pt states that he will return home in [place] and follow up at [facility] for outpatient medication management and therapy.  No further needs voiced by pt at this time.    Transportation Means: Pt reports access to transportation - has own car here  Supports: No supports mentioned at this time  [name] [name], [place] 01/23/2014 9:30 AM

## 2014-01-23 NOTE — Progress Notes (Addendum)
Patient ID: [name] [name], male   DOB: 10/11/1961, 53 y.o.   MRN: [id number] D: pt. Pleasant, reports day "much better" Pt. Says ready to go home. Pt. Reports he plans to go to support group post discharge. A: Writer encouraged pt. To follow through with plans to see a sponsor and support groups. Staff will monitor q69min for safety. R: Pt. Is safe on the unit. Pt. Attended group.

## 2014-01-23 NOTE — BHH Suicide Risk Assessment (Signed)
   Demographic Factors:  Male, Adolescent or young adult, Low socioeconomic status and Unemployed  Total Time spent with patient: 30 minutes  Psychiatric Specialty Exam: Physical Exam  ROS  Blood pressure 138/88, pulse 94, temperature 97.8 F (36.6 C), temperature source Oral, resp. rate 18, height 5\' 9"  (1.753 m), weight 63.957 kg (141 lb).Body mass index is 20.81 kg/(m^2).  General Appearance: Casual  Eye Contact::  Good  Speech:  Clear and Coherent and Slow  Volume:  Normal  Mood:  Anxious  Affect:  Appropriate and Congruent  Thought Process:  Goal Directed and Intact  Orientation:  Full (Time, Place, and Person)  Thought Content:  WDL  Suicidal Thoughts:  No  Homicidal Thoughts:  No  Memory:  Immediate;   Good  Judgement:  Intact  Insight:  Good  Psychomotor Activity:  Normal  Concentration:  Good  Recall:  [place] of Knowledge:Good  Language: Good  Akathisia:  NA  Handed:  Right  AIMS (if indicated):     Assets:  Communication Skills Desire for Improvement Financial Resources/Insurance Housing Leisure Time [place] Talents/Skills Transportation  Sleep:  Number of Hours: 6    Musculoskeletal: Strength & Muscle Tone: within normal limits Gait & Station: normal Patient leans: N/A   Mental Status Per Nursing Assessment::   On Admission:  NA  Current Mental Status by Physician: NA  Loss Factors: Decrease in vocational status and Financial problems/change in socioeconomic status  Historical Factors: Family history of mental illness or substance abuse and Impulsivity  Risk Reduction Factors:   Sense of responsibility to family, Religious beliefs about death, Positive social support, Positive therapeutic relationship and Positive coping skills or problem solving skills  Continued Clinical Symptoms:  Depression:   Comorbid alcohol abuse/dependence Recent sense of peace/wellbeing Alcohol/Substance  Abuse/Dependencies  Cognitive Features That Contribute To Risk:  Polarized thinking    Suicide Risk:  Minimal: No identifiable suicidal ideation.  Patients presenting with no risk factors but with morbid ruminations; may be classified as minimal risk based on the severity of the depressive symptoms  Discharge Diagnoses:   AXIS I:  Substance Induced Mood Disorder and Alcohol dependence AXIS II:  Deferred AXIS III:   Past Medical History  Diagnosis Date  . Alcohol abuse   . Sarcoidosis of lung   . Reflux   . Depression   . Anxiety   . Pancreatitis   . Carpal tunnel syndrome of right wrist    AXIS IV:  economic problems, occupational problems, other psychosocial or environmental problems, problems related to social environment and problems with primary support group AXIS V:  61-70 mild symptoms  Plan Of Care/Follow-up recommendations:  Activity:  As tolerated Diet:  Regular  Is patient on multiple antipsychotic therapies at discharge:  No   Has Patient had three or more failed trials of antipsychotic monotherapy by history:  No  Recommended Plan for Multiple Antipsychotic Therapies: NA    [name] [name],[name] [name]. 01/23/2014, 9:50 AM

## 2014-01-26 NOTE — Progress Notes (Signed)
Patient Discharge Instructions:  After Visit Summary (AVS):   Faxed to:  01/26/14 Discharge Summary Note:   Faxed to:  01/26/14 Psychiatric Admission Assessment Note:   Faxed to:  01/26/14 Suicide Risk Assessment - Discharge Assessment:   Faxed to:  01/26/14 Faxed/Sent to the Next Level Care provider:  01/26/14 Faxed to [facility], 01/26/2014, 3:47 PM

## 2014-02-17 ENCOUNTER — Inpatient Hospital Stay (HOSPITAL_BASED_OUTPATIENT_CLINIC_OR_DEPARTMENT_OTHER)
Admission: EM | Admit: 2014-02-17 | Discharge: 2014-02-20 | DRG: 897 | Disposition: A | Discharge status: Home / Self Care | Payer: Self-pay | Attending: Internal Medicine | Admitting: Internal Medicine

## 2014-02-17 ENCOUNTER — Emergency Department (HOSPITAL_BASED_OUTPATIENT_CLINIC_OR_DEPARTMENT_OTHER): Payer: Self-pay

## 2014-02-17 ENCOUNTER — Encounter (HOSPITAL_BASED_OUTPATIENT_CLINIC_OR_DEPARTMENT_OTHER): Payer: Self-pay | Admitting: Emergency Medicine

## 2014-02-17 DIAGNOSIS — D869 Sarcoidosis, unspecified: Secondary | ICD-10-CM | POA: Diagnosis present

## 2014-02-17 DIAGNOSIS — F10239 Alcohol dependence with withdrawal, unspecified: Principal | ICD-10-CM

## 2014-02-17 DIAGNOSIS — K700 Alcoholic fatty liver: Secondary | ICD-10-CM | POA: Diagnosis present

## 2014-02-17 DIAGNOSIS — Z5989 Other problems related to housing and economic circumstances: Secondary | ICD-10-CM

## 2014-02-17 DIAGNOSIS — Z885 Allergy status to narcotic agent status: Secondary | ICD-10-CM

## 2014-02-17 DIAGNOSIS — K2970 Gastritis, unspecified, without bleeding: Secondary | ICD-10-CM

## 2014-02-17 DIAGNOSIS — G8929 Other chronic pain: Secondary | ICD-10-CM | POA: Diagnosis present

## 2014-02-17 DIAGNOSIS — F10939 Alcohol use, unspecified with withdrawal, unspecified: Principal | ICD-10-CM | POA: Diagnosis present

## 2014-02-17 DIAGNOSIS — K219 Gastro-esophageal reflux disease without esophagitis: Secondary | ICD-10-CM | POA: Diagnosis present

## 2014-02-17 DIAGNOSIS — R7402 Elevation of levels of lactic acid dehydrogenase (LDH): Secondary | ICD-10-CM | POA: Diagnosis present

## 2014-02-17 DIAGNOSIS — R740 Nonspecific elevation of levels of transaminase and lactic acid dehydrogenase [LDH]: Secondary | ICD-10-CM

## 2014-02-17 DIAGNOSIS — M25569 Pain in unspecified knee: Secondary | ICD-10-CM | POA: Diagnosis present

## 2014-02-17 DIAGNOSIS — F329 Major depressive disorder, single episode, unspecified: Secondary | ICD-10-CM | POA: Diagnosis present

## 2014-02-17 DIAGNOSIS — F17200 Nicotine dependence, unspecified, uncomplicated: Secondary | ICD-10-CM | POA: Diagnosis present

## 2014-02-17 DIAGNOSIS — R7401 Elevation of levels of liver transaminase levels: Secondary | ICD-10-CM | POA: Diagnosis present

## 2014-02-17 DIAGNOSIS — F32A Depression, unspecified: Secondary | ICD-10-CM

## 2014-02-17 DIAGNOSIS — F3289 Other specified depressive episodes: Secondary | ICD-10-CM | POA: Diagnosis present

## 2014-02-17 DIAGNOSIS — K861 Other chronic pancreatitis: Secondary | ICD-10-CM | POA: Diagnosis present

## 2014-02-17 DIAGNOSIS — K2920 Alcoholic gastritis without bleeding: Secondary | ICD-10-CM | POA: Diagnosis present

## 2014-02-17 DIAGNOSIS — Z5987 Material hardship due to limited financial resources, not elsewhere classified: Secondary | ICD-10-CM

## 2014-02-17 DIAGNOSIS — F10929 Alcohol use, unspecified with intoxication, unspecified: Secondary | ICD-10-CM

## 2014-02-17 DIAGNOSIS — F1010 Alcohol abuse, uncomplicated: Secondary | ICD-10-CM | POA: Diagnosis present

## 2014-02-17 DIAGNOSIS — Z598 Other problems related to housing and economic circumstances: Secondary | ICD-10-CM

## 2014-02-17 DIAGNOSIS — F1020 Alcohol dependence, uncomplicated: Secondary | ICD-10-CM | POA: Diagnosis present

## 2014-02-17 DIAGNOSIS — Z79899 Other long term (current) drug therapy: Secondary | ICD-10-CM

## 2014-02-17 DIAGNOSIS — K7010 Alcoholic hepatitis without ascites: Secondary | ICD-10-CM | POA: Diagnosis present

## 2014-02-17 DIAGNOSIS — K2990 Gastroduodenitis, unspecified, without bleeding: Secondary | ICD-10-CM

## 2014-02-17 DIAGNOSIS — F411 Generalized anxiety disorder: Secondary | ICD-10-CM | POA: Diagnosis present

## 2014-02-17 DIAGNOSIS — J99 Respiratory disorders in diseases classified elsewhere: Secondary | ICD-10-CM | POA: Diagnosis present

## 2014-02-17 DIAGNOSIS — R7989 Other specified abnormal findings of blood chemistry: Secondary | ICD-10-CM | POA: Diagnosis present

## 2014-02-17 LAB — RAPID URINE DRUG SCREEN, HOSP PERFORMED
Amphetamines: NOT DETECTED
Barbiturates: NOT DETECTED
Benzodiazepines: NOT DETECTED
Cocaine: NOT DETECTED
Opiates: NOT DETECTED
Tetrahydrocannabinol: NOT DETECTED

## 2014-02-17 LAB — CBC WITH DIFFERENTIAL/PLATELET
Basophils Absolute: 0 10*3/uL (ref 0.0–0.1)
Basophils Relative: 1 % (ref 0–1)
Eosinophils Absolute: 0.1 10*3/uL (ref 0.0–0.7)
Eosinophils Relative: 2 % (ref 0–5)
HCT: 40.6 % (ref 39.0–52.0)
Hemoglobin: 13.8 g/dL (ref 13.0–17.0)
Lymphocytes Relative: 33 % (ref 12–46)
Lymphs Abs: 2 10*3/uL (ref 0.7–4.0)
MCH: 27.2 pg (ref 26.0–34.0)
MCHC: 34 g/dL (ref 30.0–36.0)
MCV: 80.1 fL (ref 78.0–100.0)
Monocytes Absolute: 0.8 10*3/uL (ref 0.1–1.0)
Monocytes Relative: 13 % — ABNORMAL HIGH (ref 3–12)
Neutro Abs: 3.1 10*3/uL (ref 1.7–7.7)
Neutrophils Relative %: 52 % (ref 43–77)
Platelets: 105 10*3/uL — ABNORMAL LOW (ref 150–400)
RBC: 5.07 MIL/uL (ref 4.22–5.81)
RDW: 16.1 % — ABNORMAL HIGH (ref 11.5–15.5)
WBC: 6 10*3/uL (ref 4.0–10.5)

## 2014-02-17 LAB — COMPREHENSIVE METABOLIC PANEL
ALT: 553 U/L — ABNORMAL HIGH (ref 0–53)
AST: 778 U/L — ABNORMAL HIGH (ref 0–37)
Albumin: 4.3 g/dL (ref 3.5–5.2)
Alkaline Phosphatase: 47 U/L (ref 39–117)
BUN: 7 mg/dL (ref 6–23)
CO2: 25 mEq/L (ref 19–32)
Calcium: 9.3 mg/dL (ref 8.4–10.5)
Chloride: 90 mEq/L — ABNORMAL LOW (ref 96–112)
Creatinine, Ser: 0.9 mg/dL (ref 0.50–1.35)
GFR calc Af Amer: >90 mL/min (ref >90)
GFR calc non Af Amer: >90 mL/min (ref >90)
Glucose, Bld: 119 mg/dL — ABNORMAL HIGH (ref 70–99)
Potassium: 4.1 mEq/L (ref 3.7–5.3)
Sodium: 136 mEq/L — ABNORMAL LOW (ref 137–147)
Total Bilirubin: 0.6 mg/dL (ref 0.3–1.2)
Total Protein: 9 g/dL — ABNORMAL HIGH (ref 6.0–8.3)

## 2014-02-17 LAB — URINALYSIS, ROUTINE W REFLEX MICROSCOPIC
Bilirubin Urine: NEGATIVE
Glucose, UA: NEGATIVE mg/dL
Ketones, ur: NEGATIVE mg/dL
Leukocytes, UA: NEGATIVE
Nitrite: NEGATIVE
Protein, ur: NEGATIVE mg/dL
Specific Gravity, Urine: 1.005 (ref 1.005–1.030)
Urobilinogen, UA: 0.2 mg/dL (ref 0.0–1.0)
pH: 6 (ref 5.0–8.0)

## 2014-02-17 LAB — URINE MICROSCOPIC-ADD ON

## 2014-02-17 LAB — LIPASE, BLOOD: Lipase: 187 U/L — ABNORMAL HIGH (ref 11–59)

## 2014-02-17 LAB — ETHANOL: Alcohol, Ethyl (B): 358 mg/dL — ABNORMAL HIGH (ref 0–11)

## 2014-02-17 MED ORDER — VITAMIN B-1 100 MG PO TABS
100.0000 mg | ORAL_TABLET | Freq: Every day | ORAL | Status: DC
  Administered 2014-02-17: 100 mg via ORAL
  Filled 2014-02-17: qty 1

## 2014-02-17 MED ORDER — THIAMINE HCL 100 MG/ML IJ SOLN
100.0000 mg | Freq: Every day | INTRAMUSCULAR | Status: DC
  Administered 2014-02-17: 100 mg via INTRAVENOUS
  Filled 2014-02-17: qty 2

## 2014-02-17 MED ORDER — LORAZEPAM 1 MG PO TABS
0.0000 mg | ORAL_TABLET | Freq: Four times a day (QID) | ORAL | Status: DC
Start: 2014-02-17 — End: 2014-02-18
  Administered 2014-02-17: 1 mg via ORAL
  Administered 2014-02-18: 2 mg via ORAL
  Administered 2014-02-18 (×2): 1 mg via ORAL
  Filled 2014-02-17: qty 2
  Filled 2014-02-17 (×3): qty 1

## 2014-02-17 MED ORDER — SODIUM CHLORIDE 0.9 % IV SOLN
INTRAVENOUS | Status: DC
  Administered 2014-02-17: 23:00:00 via INTRAVENOUS

## 2014-02-17 MED ORDER — ALUM & MAG HYDROXIDE-SIMETH 200-200-20 MG/5ML PO SUSP: 30.0000 mL | ORAL | Status: DC | PRN

## 2014-02-17 MED ORDER — ONDANSETRON HCL 4 MG PO TABS
4.0000 mg | ORAL_TABLET | Freq: Three times a day (TID) | ORAL | Status: DC | PRN
  Administered 2014-02-18: 10:00:00 via ORAL
  Administered 2014-02-18: 4 mg via ORAL
  Filled 2014-02-17 (×2): qty 1

## 2014-02-17 NOTE — BH Assessment (Signed)
Consulted with EDP Dr.Sheldon to obtain clinicals prior to assessing patient.   Shaune Pollack, MS, [place] Assessment Counselor

## 2014-02-17 NOTE — BH Assessment (Addendum)
Disposition Tech, NT [employer] [employer] agreed to seek placement at [facility] for bed placement at pt's request.   Shaune Pollack, MS, [place] Assessment Counselor

## 2014-02-17 NOTE — BH Assessment (Signed)
TTS attempted to reach EDP Dr. Karle Starch who was busy with a patient. Nursing secretary agreed to leave message for EDP to contact TTS for patient disposition update.   Shaune Pollack, MS, [place] Assessment Counselor

## 2014-02-17 NOTE — ED Provider Notes (Signed)
CSN: [id number]     Arrival date & time 02/17/14  1748 History  This chart was scribed for Charles B. Karle Starch, MD by Marcha Dutton, ED Scribe. This patient was seen in room MH02/MH02 and the patient's care was started at 6:02 PM.    Chief Complaint  Patient presents with  . Medical Clearance      The history is provided by the patient. No language interpreter was used.   HPI Comments: [name] [name] is a 53 y.o. male with a h/o alcoholism and pancreatitis who presents to the Emergency Department complaining of constant bilateral leg pain "in knee area" that began "a few weeks" ago. He reports associated numbness in his feet. He states the pain worsens with movement and is relieved by nothing. Pt denies any fall or injury.  Pt states he would like to seek treatment for alcoholism. Pt states he was in a behavioral health facility last month where he went through detox and left 01/18/2014. He reports drinking this morning and right before he came in the ED. Pt states he relapsed 2 weeks ago drinking about 2 12 packs a day. Pt denies usage of any street drugs including cocaine and marijuana.    Additionally, Pt c/o intermittent pain to his epigastric region with associated nausea. He reports the pain is worsened when he's drinking alcohol.      Past Medical History  Diagnosis Date  . Alcohol abuse   . Sarcoidosis of lung   . Reflux   . Depression   . Anxiety   . Pancreatitis   . Carpal tunnel syndrome of right wrist     Past Surgical History  Procedure Laterality Date  . Knee arthroscopy    . Carpel tunnel release    . Elbow surgery      Family History  Problem Relation Age of Onset  . Diabetes Father   . Hypertension Father   . Hypertension Paternal Grandfather   . Alcohol abuse Paternal Grandfather   . Alcohol abuse Maternal Grandfather   . Alcohol abuse Maternal Grandmother   . Alcohol abuse Paternal Grandmother     History  Substance Use Topics  . Smoking  status: Light Tobacco Smoker    Types: Cigars  . Smokeless tobacco: Not on file  . Alcohol Use: 0.0 oz/week     Comment: 3 1/2 40oz beers/daily last use today    Review of Systems A complete 10 system review of systems was obtained and all systems are negative except as noted in the HPI and PMH.     Allergies  Oxycodone  Home Medications    Current Outpatient Rx  Name  Route  Sig  Dispense  Refill  . doxepin (SINEQUAN) 25 MG capsule   Oral   Take 1 capsule (25 mg total) by mouth at bedtime as needed and may repeat dose one time if needed (insomnia).   60 capsule   0   . escitalopram (LEXAPRO) 10 MG tablet   Oral   Take 1 tablet (10 mg total) by mouth daily.   30 tablet   0   . gabapentin (NEURONTIN) 100 MG capsule   Oral   Take 2 capsules (200 mg total) by mouth 3 (three) times daily.   180 capsule   0   . Multiple Vitamin (MULTIVITAMIN WITH MINERALS) TABS tablet   Oral   Take 1 tablet by mouth daily. Please purchase over the counter in order to keep taking this treatment.         Marland Kitchen  vitamin B-12 (CYANOCOBALAMIN) 100 MCG tablet   Oral   Take 1 tablet (100 mcg total) by mouth daily.         . vitamin C (ASCORBIC ACID) 250 MG tablet   Oral   Take 1 tablet (250 mg total) by mouth daily.           Triage Vitals: BP 148/92  Pulse 103  Temp(Src) 98.4 F (36.9 C) (Oral)  Resp 16  Ht 5\' 9"  (1.753 m)  Wt 150 lb (68.04 kg)  BMI 22.14 kg/m2  SpO2 99%   Physical Exam  Nursing note and vitals reviewed. Constitutional: He is oriented to person, place, and time. He appears well-developed and well-nourished.  HENT:  Head: Normocephalic and atraumatic.  Eyes: EOM are normal. Pupils are equal, round, and reactive to light.  Neck: Normal range of motion. Neck supple.  Cardiovascular: Normal rate, normal heart sounds and intact distal pulses.   Pulmonary/Chest: Effort normal and breath sounds normal.  Abdominal: Bowel sounds are normal. He exhibits no  distension. There is no tenderness.  Musculoskeletal: Normal range of motion. He exhibits no edema and no tenderness.  Neurological: He is alert and oriented to person, place, and time. He has normal strength. No cranial nerve deficit or sensory deficit.  Skin: Skin is warm and dry. No rash noted.  Psychiatric: He has a normal mood and affect.    ED Course  Procedures (including critical care time)  DIAGNOSTIC STUDIES: Oxygen Saturation is 99% on RA, normal by my interpretation.    COORDINATION OF CARE: 6:11 PM- Pt's parents advised of plan for treatment. Parents verbalize understanding and agreement with plan.     Labs Review Labs Reviewed - No data to display Imaging Review No results found.   EKG Interpretation None      MDM   Final diagnoses:  Alcohol intoxication  Chronic pancreatitis  Alcoholic hepatitis  Chronic knee pain   Pt assessed by TTS who reviewed with clinician at [facility]. They would like for his LFT and Lipase to be rechecked in AM after sobering and IVF before accepting him for admission. He has history of chronic pancreatitis with similar Lipase levels over the last several years. His elevated AST/ALT likely related to heavy EtOH use. Care signed out at the change of shift.   I personally performed the services described in this documentation, which was scribed in my presence. The recorded information has been reviewed and is accurate.      Charles B. Karle Starch, MD 02/18/14 2321

## 2014-02-17 NOTE — BH Assessment (Signed)
TTS consult completed.   Shaune Pollack, MS, [place] Assessment Counselor

## 2014-02-17 NOTE — BH Assessment (Signed)
Consulted with EDP Dr. Karle Starch who agreed to observe patient overnight and address concerns that psychiatric extender recommended.   Shaune Pollack, MS, [place] Assessment Counselor

## 2014-02-17 NOTE — Progress Notes (Signed)
Writer faxed referral to [facility], all beds are full. TTS will follow up on placement tomorrow.  Janett [employer],MHT

## 2014-02-17 NOTE — ED Notes (Signed)
Pt reports seeking entrance into detox facility for alcohol.  Also states having pain in bilateral legs around kneecap area for 2 weeks, denies injury.  Ambulatory without difficulty.

## 2014-02-17 NOTE — ED Notes (Signed)
TTS initiated-pt alert and talking with counselor
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
## 2014-02-18 DIAGNOSIS — F1010 Alcohol abuse, uncomplicated: Secondary | ICD-10-CM

## 2014-02-18 DIAGNOSIS — K7010 Alcoholic hepatitis without ascites: Secondary | ICD-10-CM | POA: Diagnosis present

## 2014-02-18 DIAGNOSIS — F10239 Alcohol dependence with withdrawal, unspecified: Principal | ICD-10-CM

## 2014-02-18 DIAGNOSIS — R740 Nonspecific elevation of levels of transaminase and lactic acid dehydrogenase [LDH]: Secondary | ICD-10-CM

## 2014-02-18 DIAGNOSIS — F10939 Alcohol use, unspecified with withdrawal, unspecified: Principal | ICD-10-CM

## 2014-02-18 DIAGNOSIS — K2990 Gastroduodenitis, unspecified, without bleeding: Secondary | ICD-10-CM

## 2014-02-18 DIAGNOSIS — K2970 Gastritis, unspecified, without bleeding: Secondary | ICD-10-CM

## 2014-02-18 DIAGNOSIS — R7401 Elevation of levels of liver transaminase levels: Secondary | ICD-10-CM

## 2014-02-18 LAB — COMPREHENSIVE METABOLIC PANEL
ALT: 447 U/L — ABNORMAL HIGH (ref 0–53)
AST: 585 U/L — ABNORMAL HIGH (ref 0–37)
Albumin: 3.7 g/dL (ref 3.5–5.2)
Alkaline Phosphatase: 39 U/L (ref 39–117)
BUN: 6 mg/dL (ref 6–23)
CO2: 26 mEq/L (ref 19–32)
Calcium: 8.6 mg/dL (ref 8.4–10.5)
Chloride: 100 mEq/L (ref 96–112)
Creatinine, Ser: 0.9 mg/dL (ref 0.50–1.35)
GFR calc Af Amer: >90 mL/min (ref >90)
GFR calc non Af Amer: >90 mL/min (ref >90)
Glucose, Bld: 89 mg/dL (ref 70–99)
Potassium: 4.4 mEq/L (ref 3.7–5.3)
Sodium: 140 mEq/L (ref 137–147)
Total Bilirubin: 0.8 mg/dL (ref 0.3–1.2)
Total Protein: 7.7 g/dL (ref 6.0–8.3)

## 2014-02-18 LAB — CBC
HCT: 38.1 % — ABNORMAL LOW (ref 39.0–52.0)
Hemoglobin: 12.6 g/dL — ABNORMAL LOW (ref 13.0–17.0)
MCH: 26.6 pg (ref 26.0–34.0)
MCHC: 33.1 g/dL (ref 30.0–36.0)
MCV: 80.4 fL (ref 78.0–100.0)
Platelets: 100 10*3/uL — ABNORMAL LOW (ref 150–400)
RBC: 4.74 MIL/uL (ref 4.22–5.81)
RDW: 15.6 % — ABNORMAL HIGH (ref 11.5–15.5)
WBC: 4.1 10*3/uL (ref 4.0–10.5)

## 2014-02-18 LAB — CREATININE, SERUM
Creatinine, Ser: 0.95 mg/dL (ref 0.50–1.35)
GFR calc Af Amer: >90 mL/min (ref >90)
GFR calc non Af Amer: >90 mL/min (ref >90)

## 2014-02-18 LAB — HEPATITIS PANEL, ACUTE
HCV Ab: NEGATIVE
Hep A IgM: NONREACTIVE
Hep B C IgM: NONREACTIVE
Hepatitis B Surface Ag: NEGATIVE

## 2014-02-18 LAB — ETHANOL: Alcohol, Ethyl (B): 106 mg/dL — ABNORMAL HIGH (ref 0–11)

## 2014-02-18 LAB — LIPASE, BLOOD: Lipase: 170 U/L — ABNORMAL HIGH (ref 11–59)

## 2014-02-18 MED ORDER — ADULT MULTIVITAMIN W/MINERALS CH
1.0000 | ORAL_TABLET | Freq: Every day | ORAL | Status: DC
  Administered 2014-02-18 – 2014-02-20 (×3): 1 via ORAL
  Filled 2014-02-18 (×3): qty 1

## 2014-02-18 MED ORDER — FOLIC ACID 1 MG PO TABS
1.0000 mg | ORAL_TABLET | Freq: Every day | ORAL | Status: DC
  Administered 2014-02-18 – 2014-02-20 (×3): 1 mg via ORAL
  Filled 2014-02-18 (×3): qty 1

## 2014-02-18 MED ORDER — MORPHINE SULFATE 2 MG/ML IJ SOLN: 1.0000 mg | INTRAMUSCULAR | Status: DC | PRN

## 2014-02-18 MED ORDER — PANTOPRAZOLE SODIUM 40 MG PO TBEC
40.0000 mg | DELAYED_RELEASE_TABLET | Freq: Every day | ORAL | Status: DC
  Administered 2014-02-18 – 2014-02-20 (×3): 40 mg via ORAL
  Filled 2014-02-18 (×3): qty 1

## 2014-02-18 MED ORDER — LORAZEPAM 1 MG PO TABS
0.0000 mg | ORAL_TABLET | Freq: Four times a day (QID) | ORAL | Status: DC
  Administered 2014-02-18: 1 mg via ORAL
  Administered 2014-02-18 – 2014-02-19 (×2): 2 mg via ORAL
  Filled 2014-02-18 (×2): qty 2

## 2014-02-18 MED ORDER — LORAZEPAM 1 MG PO TABS
1.0000 mg | ORAL_TABLET | Freq: Four times a day (QID) | ORAL | Status: DC | PRN
  Filled 2014-02-18: qty 1

## 2014-02-18 MED ORDER — VITAMIN B-1 100 MG PO TABS
100.0000 mg | ORAL_TABLET | Freq: Every day | ORAL | Status: DC
  Administered 2014-02-18 – 2014-02-20 (×3): 100 mg via ORAL
  Filled 2014-02-18 (×3): qty 1

## 2014-02-18 MED ORDER — ENOXAPARIN SODIUM 40 MG/0.4ML ~~LOC~~ SOLN
40.0000 mg | SUBCUTANEOUS | Status: DC
  Administered 2014-02-18 – 2014-02-19 (×2): 40 mg via SUBCUTANEOUS
  Filled 2014-02-18 (×3): qty 0.4

## 2014-02-18 MED ORDER — ONDANSETRON HCL 4 MG PO TABS: 4.0000 mg | ORAL_TABLET | Freq: Four times a day (QID) | ORAL | Status: DC | PRN

## 2014-02-18 MED ORDER — SENNOSIDES-DOCUSATE SODIUM 8.6-50 MG PO TABS: 1.0000 | ORAL_TABLET | Freq: Every evening | ORAL | Status: DC | PRN

## 2014-02-18 MED ORDER — LORAZEPAM 1 MG PO TABS: 0.0000 mg | ORAL_TABLET | Freq: Two times a day (BID) | ORAL | Status: DC

## 2014-02-18 MED ORDER — THIAMINE HCL 100 MG/ML IJ SOLN
100.0000 mg | Freq: Every day | INTRAMUSCULAR | Status: DC
  Filled 2014-02-18 (×3): qty 1

## 2014-02-18 MED ORDER — SODIUM CHLORIDE 0.9 % IV SOLN
INTRAVENOUS | Status: DC
  Administered 2014-02-18: 100 mL via INTRAVENOUS
  Administered 2014-02-19: 12:00:00 via INTRAVENOUS

## 2014-02-18 MED ORDER — LORAZEPAM 2 MG/ML IJ SOLN: 1.0000 mg | Freq: Four times a day (QID) | INTRAMUSCULAR | Status: DC | PRN

## 2014-02-18 MED ORDER — VITAMIN B-1 100 MG PO TABS: 100.0000 mg | ORAL_TABLET | Freq: Every day | ORAL | Status: DC

## 2014-02-18 MED ORDER — SODIUM CHLORIDE 0.9 % IJ SOLN
3.0000 mL | Freq: Two times a day (BID) | INTRAMUSCULAR | Status: DC
  Administered 2014-02-18: 3 mL via INTRAVENOUS

## 2014-02-18 MED ORDER — ONDANSETRON HCL 4 MG/2ML IJ SOLN: 4.0000 mg | Freq: Four times a day (QID) | INTRAMUSCULAR | Status: DC | PRN

## 2014-02-18 MED ORDER — SODIUM CHLORIDE 0.9 % IV SOLN
Freq: Once | INTRAVENOUS | Status: AC
  Administered 2014-02-18: 02:00:00 via INTRAVENOUS

## 2014-02-18 MED ORDER — ESCITALOPRAM OXALATE 10 MG PO TABS
10.0000 mg | ORAL_TABLET | Freq: Every day | ORAL | Status: DC
  Administered 2014-02-18 – 2014-02-20 (×3): 10 mg via ORAL
  Filled 2014-02-18 (×3): qty 1

## 2014-02-18 MED ORDER — SODIUM CHLORIDE 0.9 % IV SOLN
INTRAVENOUS | Status: AC
  Administered 2014-02-18: 15:00:00 via INTRAVENOUS

## 2014-02-18 MED ORDER — THIAMINE HCL 100 MG/ML IJ SOLN: 100.0000 mg | Freq: Every day | INTRAMUSCULAR | Status: DC

## 2014-02-18 MED ORDER — GABAPENTIN 100 MG PO CAPS
200.0000 mg | ORAL_CAPSULE | Freq: Three times a day (TID) | ORAL | Status: DC
  Administered 2014-02-18 – 2014-02-20 (×5): 200 mg via ORAL
  Filled 2014-02-18 (×8): qty 2

## 2014-02-18 NOTE — ED Notes (Signed)
Report given to [facility] RN Dozier.

## 2014-02-18 NOTE — ED Provider Notes (Signed)
She presented last night asking for detox from alcohol. Initial blood work, however, are revealed elevated lipase as well significantly elevated transaminases. Patient has had chronically elevated lipase in the past, but the LFT abnormalities are new for him. This is likely secondary to alcohol intake and acute alcoholic hepatitis. Infectious hepatitis would be also considered. I do not have any suspicion for gallbladder disease, as the patient is not experiencing any pain currently. Examination reveals the right upper quadrant tenderness. He does not have any elevated alkaline phosphatase or bilirubin.  Recent is starting to have withdrawal symptoms. His initial alcohol was over 300, but repeat after monitoring here in the ER overnight is now 100. He is requiring Ativan for withdrawal symptoms. Based on his elevated lipase and transaminases, patient is not a candidate for detox. He cannot be medically cleared at this point. He will therefore be admitted to [facility] service) for further management. Discusses with Dr. Clementeen Graham, who has accepted the patient. Patient is stable for transfer at this time.  Physical Exam  Constitutional: He is oriented to person, place, and time. He appears well-developed and well-nourished.  HENT:  Head: Normocephalic and atraumatic.  Eyes: EOM are normal. Pupils are equal, round, and reactive to light.  Neck: Normal range of motion. Neck supple.  Cardiovascular: Regular rhythm.  Exam reveals no friction rub.   No murmur heard. Pulmonary/Chest: Effort normal and breath sounds normal.  Abdominal: Soft. He exhibits no distension and no mass. There is no tenderness. There is no rebound and no guarding.  Musculoskeletal: Normal range of motion.  Neurological: He is alert and oriented to person, place, and time.  Slight tremor in hands  Skin: Skin is warm. He is diaphoretic.  Psychiatric: He has a normal mood and affect. His behavior is  normal. Judgment and thought content normal.   Filed Vitals:   02/18/14 1006  BP: 148/93  Pulse: 101  Temp: 98.5 F (36.9 C)  Resp: 16     Orpah Greek, MD 02/18/14 1104

## 2014-02-18 NOTE — ED Notes (Signed)
Patient is resting comfortably. No complaints

## 2014-02-18 NOTE — ED Notes (Signed)
Report called to unit RN. °

## 2014-02-18 NOTE — Progress Notes (Signed)
Spoke with Anderson Malta at [employer] regarding placing patient on tele box #50.

## 2014-02-18 NOTE — ED Notes (Signed)
Pt informed of plan of care. 

## 2014-02-18 NOTE — ED Notes (Signed)
MD at bedside. 

## 2014-02-18 NOTE — ED Notes (Signed)
Lunch provided.

## 2014-02-18 NOTE — ED Notes (Signed)
[facility] at bedside preparing for transport 

## 2014-02-18 NOTE — ED Notes (Signed)
Report received and care assumed.

## 2014-02-18 NOTE — H&P (Signed)
[facility]          History and Physical    PCP:   Lucretia Kern., DO   Chief Complaint:  Alcohol abuse, wants detox  HPI: Patient is a 53 year old man with past medical history significant for alcohol abuse, depression, anxiety, who presented to [facility] yesterday after an alcohol binge requesting detox. He was supposed to go to behavioral health, however they requested medical clearance given his elevated LFTs. Hospitalist admission was requested.  Allergies:   Allergies  Allergen Reactions  . Oxycodone Other (See Comments)    abd pain      Past Medical History  Diagnosis Date  . Alcohol abuse   . Sarcoidosis of lung   . Reflux   . Depression   . Anxiety   . Pancreatitis   . Carpal tunnel syndrome of right wrist     Past Surgical History  Procedure Laterality Date  . Knee arthroscopy    . Carpel tunnel release    . Elbow surgery      Prior to Admission medications   Medication Sig Start Date End Date Taking? Authorizing Provider  doxepin (SINEQUAN) 25 MG capsule Take 1 capsule (25 mg total) by mouth at bedtime as needed and may repeat dose one time if needed (insomnia). 01/23/14  Yes Elmarie Shiley, NP  escitalopram (LEXAPRO) 10 MG tablet Take 1 tablet (10 mg total) by mouth daily. 01/23/14  Yes Elmarie Shiley, NP  gabapentin (NEURONTIN) 100 MG capsule Take 2 capsules (200 mg total) by mouth 3 (three) times daily. 01/23/14  Yes Elmarie Shiley, NP  Multiple Vitamin (MULTIVITAMIN WITH MINERALS) TABS tablet Take 1 tablet by mouth daily. Please purchase over the counter in order to keep taking this treatment. 01/23/14  Yes Elmarie Shiley, NP  vitamin B-12 (CYANOCOBALAMIN) 100 MCG tablet Take 1 tablet (100 mcg total) by mouth daily. 01/23/14  Yes Elmarie Shiley, NP  vitamin C (ASCORBIC ACID) 250 MG tablet Take 1 tablet (250 mg total) by mouth daily. 01/23/14  Yes Elmarie Shiley, NP    Social History:  reports that he has been smoking Cigars.  He does not have  any smokeless tobacco history on file. He reports that he drinks alcohol. He reports that he does not use illicit drugs.  Family History  Problem Relation Age of Onset  . Diabetes Father   . Hypertension Father   . Hypertension Paternal Grandfather   . Alcohol abuse Paternal Grandfather   . Alcohol abuse Maternal Grandfather   . Alcohol abuse Maternal Grandmother   . Alcohol abuse Paternal Grandmother     Review of Systems:  Constitutional: Denies fever, chills, diaphoresis, appetite change and fatigue.  HEENT: Denies photophobia, eye pain, redness, hearing loss, ear pain, congestion, sore throat, rhinorrhea, sneezing, mouth sores, trouble swallowing, neck pain, neck stiffness and tinnitus.   Respiratory: Denies SOB, DOE, cough, chest tightness,  and wheezing.   Cardiovascular: Denies chest pain, palpitations and leg swelling.  Gastrointestinal: Denies nausea, vomiting, abdominal pain, diarrhea, constipation, blood in stool and abdominal distention.  Genitourinary: Denies dysuria, urgency, frequency, hematuria, flank pain and difficulty urinating.  Endocrine: Denies: hot or cold intolerance, sweats, changes in hair or nails, polyuria, polydipsia. Musculoskeletal: Denies myalgias, back pain, joint swelling, arthralgias and gait problem.  Skin: Denies pallor, rash and wound.  Neurological: Denies dizziness, seizures, syncope, weakness, light-headedness, numbness and headaches.  Hematological: Denies adenopathy. Easy bruising, personal or family bleeding history  Psychiatric/Behavioral: Denies suicidal ideation, mood changes, confusion,  nervousness, sleep disturbance and agitation   Physical Exam: Blood pressure 157/97, pulse 95, temperature 100 F (37.8 C), temperature source Oral, resp. rate 18, height '5\' 9"'  (1.753 m), weight 66 kg (145 lb 8.1 oz), SpO2 99.00%. General: Alert, awake, oriented x3, shaky HEENT: Normocephalic, atraumatic, pupils equal round and reactive to light,  extraocular movements intact, somewhat dry mucous membranes Neck: Supple, no JVD, no lymphadenopathy, no bruits, no goiter. Cardiovascular: Regular rate and rhythm, no murmurs, rubs or gallops. Lungs: Clear to auscultation bilaterally. Abdomen: Soft, nondistended, positive bowel sounds, epigastric tenderness to palpation. Extremities: No clubbing, cyanosis or edema, positive pulses. Neurologic: Grossly intact and nonfocal.  Labs on Admission:  Results for orders placed during the hospital encounter of 02/17/14 (from the past 48 hour(s))  URINALYSIS, ROUTINE W REFLEX MICROSCOPIC     Status: Abnormal   Collection Time    02/17/14  6:23 PM      Result Value Ref Range   Color, Urine YELLOW  YELLOW   APPearance CLEAR  CLEAR   Specific Gravity, Urine 1.005  1.005 - 1.030   pH 6.0  5.0 - 8.0   Glucose, UA NEGATIVE  NEGATIVE mg/dL   Hgb urine dipstick TRACE (*) NEGATIVE   Bilirubin Urine NEGATIVE  NEGATIVE   Ketones, ur NEGATIVE  NEGATIVE mg/dL   Protein, ur NEGATIVE  NEGATIVE mg/dL   Urobilinogen, UA 0.2  0.0 - 1.0 mg/dL   Nitrite NEGATIVE  NEGATIVE   Leukocytes, UA NEGATIVE  NEGATIVE  URINE RAPID DRUG SCREEN (HOSP PERFORMED)     Status: None   Collection Time    02/17/14  6:23 PM      Result Value Ref Range   Opiates NONE DETECTED  NONE DETECTED   Cocaine NONE DETECTED  NONE DETECTED   Benzodiazepines NONE DETECTED  NONE DETECTED   Amphetamines NONE DETECTED  NONE DETECTED   Tetrahydrocannabinol NONE DETECTED  NONE DETECTED   Barbiturates NONE DETECTED  NONE DETECTED   Comment:            DRUG SCREEN FOR MEDICAL PURPOSES     ONLY.  IF CONFIRMATION IS NEEDED     FOR ANY PURPOSE, NOTIFY LAB     WITHIN 5 DAYS.                LOWEST DETECTABLE LIMITS     FOR URINE DRUG SCREEN     Drug Class       Cutoff (ng/mL)     Amphetamine      1000     Barbiturate      200     Benzodiazepine   814     Tricyclics       481     Opiates          300     Cocaine          300     THC               50  URINE MICROSCOPIC-ADD ON     Status: None   Collection Time    02/17/14  6:23 PM      Result Value Ref Range   Squamous Epithelial / LPF RARE  RARE   RBC / HPF 0-2  <3 RBC/hpf   Bacteria, UA RARE  RARE  CBC WITH DIFFERENTIAL     Status: Abnormal   Collection Time    02/17/14  6:24 PM      Result Value Ref Range  WBC 6.0  4.0 - 10.5 K/uL   RBC 5.07  4.22 - 5.81 MIL/uL   Hemoglobin 13.8  13.0 - 17.0 g/dL   HCT 40.6  39.0 - 52.0 %   MCV 80.1  78.0 - 100.0 fL   MCH 27.2  26.0 - 34.0 pg   MCHC 34.0  30.0 - 36.0 g/dL   RDW 16.1 (*) 11.5 - 15.5 %   Platelets 105 (*) 150 - 400 K/uL   Comment: PLATELET COUNT CONFIRMED BY SMEAR     SPECIMEN CHECKED FOR CLOTS     REPEATED TO VERIFY   Neutrophils Relative % 52  43 - 77 %   Neutro Abs 3.1  1.7 - 7.7 K/uL   Lymphocytes Relative 33  12 - 46 %   Lymphs Abs 2.0  0.7 - 4.0 K/uL   Monocytes Relative 13 (*) 3 - 12 %   Monocytes Absolute 0.8  0.1 - 1.0 K/uL   Eosinophils Relative 2  0 - 5 %   Eosinophils Absolute 0.1  0.0 - 0.7 K/uL   Basophils Relative 1  0 - 1 %   Basophils Absolute 0.0  0.0 - 0.1 K/uL  COMPREHENSIVE METABOLIC PANEL     Status: Abnormal   Collection Time    02/17/14  6:24 PM      Result Value Ref Range   Sodium 136 (*) 137 - 147 mEq/L   Potassium 4.1  3.7 - 5.3 mEq/L   Chloride 90 (*) 96 - 112 mEq/L   CO2 25  19 - 32 mEq/L   Glucose, Bld 119 (*) 70 - 99 mg/dL   BUN 7  6 - 23 mg/dL   Creatinine, Ser 0.90  0.50 - 1.35 mg/dL   Calcium 9.3  8.4 - 10.5 mg/dL   Total Protein 9.0 (*) 6.0 - 8.3 g/dL   Albumin 4.3  3.5 - 5.2 g/dL   AST 778 (*) 0 - 37 U/L   ALT 553 (*) 0 - 53 U/L   Alkaline Phosphatase 47  39 - 117 U/L   Total Bilirubin 0.6  0.3 - 1.2 mg/dL   GFR calc non Af Amer >90  >90 mL/min   GFR calc Af Amer >90  >90 mL/min   Comment: (NOTE)     The eGFR has been calculated using the CKD EPI equation.     This calculation has not been validated in all clinical situations.     eGFR's persistently <90 mL/min  signify possible Chronic Kidney     Disease.  LIPASE, BLOOD     Status: Abnormal   Collection Time    02/17/14  6:24 PM      Result Value Ref Range   Lipase 187 (*) 11 - 59 U/L  ETHANOL     Status: Abnormal   Collection Time    02/17/14  6:24 PM      Result Value Ref Range   Alcohol, Ethyl (B) 358 (*) 0 - 11 mg/dL   Comment:            LOWEST DETECTABLE LIMIT FOR     SERUM ALCOHOL IS 11 mg/dL     FOR MEDICAL PURPOSES ONLY  COMPREHENSIVE METABOLIC PANEL     Status: Abnormal   Collection Time    02/18/14  6:45 AM      Result Value Ref Range   Sodium 140  137 - 147 mEq/L   Potassium 4.4  3.7 - 5.3 mEq/L   Chloride 100  96 - 112 mEq/L   Comment: DELTA CHECK NOTED   CO2 26  19 - 32 mEq/L   Glucose, Bld 89  70 - 99 mg/dL   BUN 6  6 - 23 mg/dL   Creatinine, Ser 0.90  0.50 - 1.35 mg/dL   Calcium 8.6  8.4 - 10.5 mg/dL   Total Protein 7.7  6.0 - 8.3 g/dL   Albumin 3.7  3.5 - 5.2 g/dL   AST 585 (*) 0 - 37 U/L   Comment: REPEATED TO VERIFY   ALT 447 (*) 0 - 53 U/L   Alkaline Phosphatase 39  39 - 117 U/L   Total Bilirubin 0.8  0.3 - 1.2 mg/dL   GFR calc non Af Amer >90  >90 mL/min   GFR calc Af Amer >90  >90 mL/min   Comment: (NOTE)     The eGFR has been calculated using the CKD EPI equation.     This calculation has not been validated in all clinical situations.     eGFR's persistently <90 mL/min signify possible Chronic Kidney     Disease.  ETHANOL     Status: Abnormal   Collection Time    02/18/14  6:45 AM      Result Value Ref Range   Alcohol, Ethyl (B) 106 (*) 0 - 11 mg/dL   Comment:            LOWEST DETECTABLE LIMIT FOR     SERUM ALCOHOL IS 11 mg/dL     FOR MEDICAL PURPOSES ONLY  LIPASE, BLOOD     Status: Abnormal   Collection Time    02/18/14  6:45 AM      Result Value Ref Range   Lipase 170 (*) 11 - 59 U/L  HEPATITIS PANEL, ACUTE     Status: None   Collection Time    02/18/14 11:30 AM      Result Value Ref Range   Hepatitis B Surface Ag NEGATIVE  NEGATIVE    HCV Ab NEGATIVE  NEGATIVE   Hep A IgM NON REACTIVE  NON REACTIVE   Hep B C IgM NON REACTIVE  NON REACTIVE   Comment: (NOTE)     High levels of Hepatitis B Core IgM antibody are detectable     during the acute stage of Hepatitis B. This antibody is used     to differentiate current from past HBV infection.     Performed at [facility] on Admission: Dg Knee Complete 4 Views Left  02/17/2014   CLINICAL DATA:  Bilateral knee pain.  No known injury.  EXAM: LEFT KNEE - COMPLETE 4+ VIEW  COMPARISON:  None.  FINDINGS: There is no evidence of fracture, dislocation, or joint effusion. 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 Mild medial compartment narrowing with small marginal spurs.  IMPRESSION: No acute osseous findings.   Electronically Signed   By: Jorje Guild M.D.   On: 02/17/2014 18:55   Dg Knee Complete 4 Views Right  02/17/2014   CLINICAL DATA:  Bilateral knee pain.  No known injury.  EXAM: RIGHT KNEE - COMPLETE 4+ VIEW  COMPARISON:  None.  FINDINGS: There is no evidence of fracture, dislocation, or joint effusion. There is no evidence of arthropathy or other focal bone abnormality. Soft tissues are unremarkable.  IMPRESSION: Negative.   Electronically Signed   By: Jorje Guild M.D.   On:  02/17/2014 18:56    Assessment/Plan Active Problems:   Alcohol withdrawal   Alcohol abuse   Alcoholic hepatitis   Transaminitis   Unspecified gastritis and gastroduodenitis without mention of hemorrhage    Alcohol abuse with early alcohol withdrawals -Admit to telemetry on CIWA protocol. -Thiamine/folate.  Transaminitis -Likely related to alcoholic hepatitis -Check acute hepatitis panel and right upper quadrant ultrasound.  Alcoholic gastritis -This likely explains his epigastric abdominal pain. -Start on Protonix daily.  Anxiety/depression -Continue psychotropic  medications.  DVT prophylaxis -Lovenox.  CODE STATUS -Full code  Time Spent on Admission: 75 minutes  HERNANDEZ ACOSTA,ESTELA [facility] Pager: [phone] 02/18/2014, 5:55 PM

## 2014-02-18 NOTE — ED Notes (Signed)
EDP speaking with consult for admission

## 2014-02-18 NOTE — Progress Notes (Signed)
Calls made to the following facilities by this staff.  No availability was reported by the following facilities: [facility];  [facility];  [facility]  Referral faxed to [facility] as requested by [facility]  Follow up calls will be made today at 3:00 re: possible discharges.

## 2014-02-18 NOTE — ED Notes (Signed)
Awaiting bed assignment.

## 2014-02-18 NOTE — Progress Notes (Signed)
Patient received from [facility] via [facility].  Patient alert and oriented x 4, no complaints of pain at this time, CIWA = 6, NS infusing at 190ml/hour in left forearm.  Oriented to room, call light, tv, npo status, to call for assistance if he needs to go to bathroom and bed alarm initiated.  Notified Dr. Jerilee Hoh that patient had arrived.

## 2014-02-19 ENCOUNTER — Inpatient Hospital Stay (HOSPITAL_COMMUNITY): Payer: BC Managed Care – PPO

## 2014-02-19 DIAGNOSIS — F1020 Alcohol dependence, uncomplicated: Secondary | ICD-10-CM

## 2014-02-19 LAB — COMPREHENSIVE METABOLIC PANEL
ALT: 318 U/L — ABNORMAL HIGH (ref 0–53)
AST: 290 U/L — ABNORMAL HIGH (ref 0–37)
Albumin: 3.7 g/dL (ref 3.5–5.2)
Alkaline Phosphatase: 40 U/L (ref 39–117)
BUN: 7 mg/dL (ref 6–23)
CO2: 25 mEq/L (ref 19–32)
Calcium: 9.1 mg/dL (ref 8.4–10.5)
Chloride: 97 mEq/L (ref 96–112)
Creatinine, Ser: 0.99 mg/dL (ref 0.50–1.35)
GFR calc Af Amer: >90 mL/min (ref >90)
GFR calc non Af Amer: >90 mL/min (ref >90)
Glucose, Bld: 90 mg/dL (ref 70–99)
Potassium: 3.6 mEq/L — ABNORMAL LOW (ref 3.7–5.3)
Sodium: 135 mEq/L — ABNORMAL LOW (ref 137–147)
Total Bilirubin: 0.8 mg/dL (ref 0.3–1.2)
Total Protein: 7.6 g/dL (ref 6.0–8.3)

## 2014-02-19 LAB — CBC
HCT: 37.6 % — ABNORMAL LOW (ref 39.0–52.0)
Hemoglobin: 12.2 g/dL — ABNORMAL LOW (ref 13.0–17.0)
MCH: 26.8 pg (ref 26.0–34.0)
MCHC: 32.4 g/dL (ref 30.0–36.0)
MCV: 82.5 fL (ref 78.0–100.0)
Platelets: 101 10*3/uL — ABNORMAL LOW (ref 150–400)
RBC: 4.56 MIL/uL (ref 4.22–5.81)
RDW: 15.7 % — ABNORMAL HIGH (ref 11.5–15.5)
WBC: 4.6 10*3/uL (ref 4.0–10.5)

## 2014-02-19 MED ORDER — LOPERAMIDE HCL 2 MG PO CAPS
2.0000 mg | ORAL_CAPSULE | Freq: Three times a day (TID) | ORAL | Status: DC | PRN
  Administered 2014-02-19: 2 mg via ORAL
  Filled 2014-02-19: qty 1

## 2014-02-19 MED ORDER — ZOLPIDEM TARTRATE 5 MG PO TABS
5.0000 mg | ORAL_TABLET | Freq: Every evening | ORAL | Status: DC | PRN
  Administered 2014-02-19: 5 mg via ORAL
  Filled 2014-02-19: qty 1

## 2014-02-19 MED ORDER — LIP MEDEX EX OINT
TOPICAL_OINTMENT | CUTANEOUS | Status: DC | PRN
  Filled 2014-02-19: qty 7

## 2014-02-19 NOTE — Progress Notes (Signed)
Clinical Social Work Department CLINICAL SOCIAL WORK PSYCHIATRY SERVICE LINE ASSESSMENT 02/19/2014  Patient:  [name] [name]  Account:  [id number]  Admit Date:  02/17/2014  Clinical Social Worker:  HOLLY GERBER, LCSW  Date/Time:  02/19/2014 11:30 AM Referred by:  Physician  Date referred:  02/19/2014 Reason for Referral  Substance Abuse   Presenting Symptoms/Problems (In the person's/family's own words):   Psych consulted due to substance abuse.   Abuse/Neglect/Trauma History (check all that apply)  Denies history   Abuse/Neglect/Trauma Comments:   Psychiatric History (check all that apply)  Outpatient treatment  Residential treatment  Inpatient/hospitilization   Psychiatric medications:  Lexapro 10 mg   Current Mental Health Hospitalizations/Previous Mental Health History:   Patient reports that he felt that he has had issues with anxiety throughout his entire life. Patient reports that when he was younger it was not socially acceptable to talk about depression or anxiety. When patient was at last treatment facility he started medication for depression and anxiety. Patient has no current outpatient follow up.   Current provider:   None   Place and Date:   N/A   Current Medications:   Scheduled Meds:      . enoxaparin (LOVENOX) injection  40 mg Subcutaneous Q24H  . escitalopram  10 mg Oral Daily  . folic acid  1 mg Oral Daily  . gabapentin  200 mg Oral TID  . LORazepam  0-4 mg Oral Q6H   Followed by     . [START ON 02/20/2014] LORazepam  0-4 mg Oral Q12H  . multivitamin with minerals  1 tablet Oral Daily  . pantoprazole  40 mg Oral Daily  . sodium chloride  3 mL Intravenous Q12H  . thiamine  100 mg Oral Daily   Or     . thiamine  100 mg Intravenous Daily        Continuous Infusions:      . sodium chloride 100 mL (02/18/14 1757)          PRN Meds:.lip balm, loperamide, LORazepam, LORazepam, morphine injection, ondansetron (ZOFRAN) IV, ondansetron, senna-docusate        Previous Impatient Admission/Date/Reason:   Patient has been to [facility], [facility], [facility], [facility], and several other facilities for detox and inpatient treatment.   Emotional Health / Current Symptoms    Suicide/Self Harm  None reported   Suicide attempt in the past:   Patient denies any SI or HI.   Other harmful behavior:   None reported   Psychotic/Dissociative Symptoms  None reported   Other Psychotic/Dissociative Symptoms:    Attention/Behavioral Symptoms  Within Normal Limits   Other Attention / Behavioral Symptoms:   Patient engaged with appropriate behavior throughout assessment.    Cognitive Impairment  Within Normal Limits   Other Cognitive Impairment:   Patient alert and oriented.    Mood and Adjustment  Mood Congruent    Stress, Anxiety, Trauma, Any Recent Loss/Stressor  Anxiety   Anxiety (frequency):   Patient reports he has experienced panic attacks throughout his entire life. Patient reports he often uses alcohol to cope with his anxiety.   Phobia (specify):   N/A   Compulsive behavior (specify):   N/A   Obsessive behavior (specify):   N/A   Other:   N/A   Substance Abuse/Use  Current substance use   SBIRT completed (please refer for detailed history):  Y  Self-reported substance use:   Patient agreeable to complete SBIRT and scored 31. Patient aware of high score   and agreeable to detox. Patient reports he has been through several facilities in the past but is motivated to get sober.   Urinary Drug Screen Completed:  Y Alcohol level:   324    Environmental/Housing/Living Arrangement  Stable housing   Who is in the home:   Alone   Emergency contact:  [place]   Patient's Strengths and Goals (patient's own words):   Patient admitted for detox. Patient agreeable to treatment. Patient has good family support.   Clinical Social Worker's Interpretive Summary:   CSW received referral in order to  complete psychosocial assessment. CSW reviewed chart and met with patient and parents at bedside. Patient reports that his parents are very involved in his life and want them to be involved in assessment. CSW introduced myself and explained role.    Patient reports he drank on 3/24 and decided that he needed to get sober again and came to the hospital. Patient is currently drinking on a daily basis around 10-12 beers. Patient reports that he started drinking around the age of 48 and has been drinking heavily over the past 10-12 years. Patient reports that he started drinking as a ways to control his anxiety because he did not feel there was any other way to cope. Patient reports as he got older he became addicted and has been unable to remain sober. Patient has been to several detox and treatment facilities. Patient's last treatment was at [facility] in [place] at the end of October 2014.    Patient reports that when he was at treatment facility he discussed how depression and anxiety was related to alcohol use. Facility started patient on medication and patient reports he has been compliant with medications but has not followed up on an outpatient basis. Patient has been to [place] [facility] and to [facility] but reports no recent follow up. Patient reports after detox he plans to start therapy and medication management again and wants a long-term option. CSW and patient discussed [facility] in order to have additional support. CSW provided information along with houses with vacancies for patient to contact. Patient and his parents report they will review information and will possibly plan on patient moving in order to have positive supports.    Patient alert and oriented and engaged. Patient reports he feels he needs to be sober for himself and for his family. Parents want patient to have a long period of sobriety and agreeable to assist as much as they can. Patient reports that  he is not experiencing any SI or HI or psychotic features and is agreeable to detox.    CSW will search for placement.   Disposition:  Inpatient referral made [facility], [facility], [place])   [place], [place] [phone]

## 2014-02-19 NOTE — Progress Notes (Signed)
[facility] PROGRESS NOTE  [name] CZY:[id number] DOB: 1961/07/05 DOA: 02/17/2014 PCP: Colin Benton R., DO  Assessment/Plan: ETOH Abuse/Withdrawal -Thiamine/folate. -CIWA scores are improving. -SW looking into possible inpatient ETOH detox center.  Transaminitis -Improving. -Hepatitis panel negative. -RUQ [place] with hepatic steatosis.  Alcoholic Gastritis -Continue PPI.  Code Status: Full Code Family Communication: Parents at bedside updated on plan of care.  Disposition Plan: To be determined.   Consultants:  Psychiatry   Antibiotics:  None   Subjective: No complaints.  Objective: Filed Vitals:   02/18/14 1610 02/18/14 2015 02/19/14 0009 02/19/14 0600  BP: 157/97 141/88 146/95 144/96  Pulse: 95 78 93 76  Temp: 100 F (37.8 C)  100 F (37.8 C) 99.4 F (37.4 C)  TempSrc: Oral  Oral Oral  Resp: 18  18 18   Height: 5\' 9"  (1.753 m)     Weight: 66 kg (145 lb 8.1 oz)     SpO2: 99%  96% 95%    Intake/Output Summary (Last 24 hours) at 02/19/14 1439 Last data filed at 02/19/14 1340  Gross per 24 hour  Intake    600 ml  Output   1650 ml  Net  -1050 ml   Filed Weights   02/17/14 1753 02/18/14 1610  Weight: 68.04 kg (150 lb) 66 kg (145 lb 8.1 oz)    Exam:   General:  AA Ox3  Cardiovascular: RRR  Respiratory: CTA B  Abdomen: S/NT/ND/+BS  Extremities: no C/C/E   Neurologic:  Non-focal  Data Reviewed: Basic Metabolic Panel:  Recent Labs Lab 02/17/14 1824 02/18/14 0645 02/18/14 1832 02/19/14 0443  NA 136* 140  --  135*  K 4.1 4.4  --  3.6*  CL 90* 100  --  97  CO2 25 26  --  25  GLUCOSE 119* 89  --  90  BUN 7 6  --  7  CREATININE 0.90 0.90 0.95 0.99  CALCIUM 9.3 8.6  --  9.1   Liver Function Tests:  Recent Labs Lab 02/17/14 1824 02/18/14 0645 02/19/14 0443  AST 778* 585* 290*  ALT 553* 447* 318*  ALKPHOS 47 39 40  BILITOT 0.6 0.8 0.8  PROT 9.0* 7.7 7.6  ALBUMIN 4.3 3.7 3.7    Recent Labs Lab 02/17/14 1824  02/18/14 0645  LIPASE 187* 170*   No results found for this basename: AMMONIA,  in the last 168 hours CBC:  Recent Labs Lab 02/17/14 1824 02/18/14 1832 02/19/14 0443  WBC 6.0 4.1 4.6  NEUTROABS 3.1  --   --   HGB 13.8 12.6* 12.2*  HCT 40.6 38.1* 37.6*  MCV 80.1 80.4 82.5  PLT 105* 100* 101*   Cardiac Enzymes: No results found for this basename: CKTOTAL, CKMB, CKMBINDEX, TROPONINI,  in the last 168 hours BNP (last 3 results) No results found for this basename: PROBNP,  in the last 8760 hours CBG: No results found for this basename: GLUCAP,  in the last 168 hours  No results found for this or any previous visit (from the past 240 hour(s)).   Studies: Dg Knee Complete 4 Views Left  02/17/2014   CLINICAL DATA:  Bilateral knee pain.  No known injury.  EXAM: LEFT KNEE - COMPLETE 4+ VIEW  COMPARISON:  None.  FINDINGS: There is no evidence of fracture, dislocation, or joint effusion. 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 Mild medial compartment narrowing with small marginal spurs.  IMPRESSION: No acute  osseous findings.   Electronically Signed   By: Jorje Guild M.D.   On: 02/17/2014 18:55   Dg Knee Complete 4 Views Right  02/17/2014   CLINICAL DATA:  Bilateral knee pain.  No known injury.  EXAM: RIGHT KNEE - COMPLETE 4+ VIEW  COMPARISON:  None.  FINDINGS: There is no evidence of fracture, dislocation, or joint effusion. There is no evidence of arthropathy or other focal bone abnormality. Soft tissues are unremarkable.  IMPRESSION: Negative.   Electronically Signed   By: Jorje Guild M.D.   On: 02/17/2014 18:56   US Abdomen Limited Ruq  02/19/2014   CLINICAL DATA:  Elevated LFTs  EXAM: US ABDOMEN LIMITED - RIGHT UPPER QUADRANT  COMPARISON:  08/23/2013, 09/01/2013  FINDINGS: Gallbladder:  Trace amount of intraluminal gallbladder sludge. Negative for definite gallstones. Normal wall thickness  measuring 2.2 mm. No Murphy sign elicited.  Common bile duct:  Diameter: 9.5 mm.  Liver:  Diffuse increased echogenicity compatible with hepatic steatosis or fatty infiltration. No definite focal hepatic abnormality. No intrahepatic biliary dilatation. Patent portal vein with normal hepatopetal flow.  IMPRESSION: Trace amount of gallbladder sludge.  Negative for gallstones or acute cholecystitis  Hepatic steatosis  9.5 mm mildly dilated common bile duct, grossly stable, previously measuring 8 mm. No associated intrahepatic biliary dilatation.   Electronically Signed   By: Daryll Brod M.D.   On: 02/19/2014 09:50    Scheduled Meds: . enoxaparin (LOVENOX) injection  40 mg Subcutaneous Q24H  . escitalopram  10 mg Oral Daily  . folic acid  1 mg Oral Daily  . gabapentin  200 mg Oral TID  . LORazepam  0-4 mg Oral Q6H   Followed by  . [START ON 02/20/2014] LORazepam  0-4 mg Oral Q12H  . multivitamin with minerals  1 tablet Oral Daily  . pantoprazole  40 mg Oral Daily  . sodium chloride  3 mL Intravenous Q12H  . thiamine  100 mg Oral Daily   Or  . thiamine  100 mg Intravenous Daily   Continuous Infusions: . sodium chloride 100 mL/hr at 02/19/14 1145    Active Problems:   Alcohol withdrawal   Alcohol abuse   Alcoholic hepatitis   Transaminitis   Unspecified gastritis and gastroduodenitis without mention of hemorrhage    Time spent: 35 minutes. Greater than 50% of this time was spent in direct contact with the patient coordinating care.    Lelon Frohlich  [facility] Pager [phone]  If 7PM-7AM, please contact night-coverage at [email or web address], password [facility] 02/19/2014, 2:39 PM  LOS: 2 days

## 2014-02-19 NOTE — Progress Notes (Signed)
Clinical Social Work  [facility] called and reported they cannot accept patient because he is not meeting criteria for detox admission. CSW spoke with MD and explained [facility]'s decision. MD reports if detox complete and patient stable then possible DC tomorrow. CSW met with patient and explained that patient would complete detox at the hospital. Patient reports he has reviewed [facility] list and [facility] information and feels they might be good placement. CSW explained that although patient is not eligible for detox at [facility], [place] could assist with inpatient treatment programs at [place], [facility] or [facility]. Patient reports he wants to DC home and follow up with [facility] [place]. CSW provided further substance abuse information and encouraged patient to consider all options. CSW will follow up with patient in the morning to determine decision re: treatment.  [place], [place] [phone]

## 2014-02-19 NOTE — Progress Notes (Signed)
Clinical Social Work  [facility] Programme researcher, broadcasting/film[employer]) called and asked for updated information on medical conditions. [facility] will have NP review information and will call CSW back with determination.  [place], [place] [phone]

## 2014-02-19 NOTE — Progress Notes (Signed)
Clinical Social Work  CSW contacted the following facilities re: detox placement:  [facility]- no available beds  [facility]- will review information after psych MD consults  [facility]- available beds. Referral faxed  [facility]- available beds for night shift. [facility] faxed  [facility]- no available beds but expected DC tomorrow.  CSW will continue to follow.  [place], [place] [phone]

## 2014-02-19 NOTE — Consult Note (Signed)
[name] Face-to-Face Psychiatry Consult   Reason for Consult:  Alcohol detox Referring Physician:  Dr Chasen Mendell is an 53 y.o. male. Total Time spent with patient: 20 minutes  Assessment: AXIS I:  Alcohol dependence AXIS II:  Deferred AXIS III:   Past Medical History  Diagnosis Date  . Alcohol abuse   . Sarcoidosis of lung   . Reflux   . Depression   . Anxiety   . Pancreatitis   . Carpal tunnel syndrome of right wrist    AXIS IV:  economic problems and other psychosocial or environmental problems AXIS V:  51-60 moderate symptoms  Plan:  No evidence of imminent risk to self or others at present.   Patient does not meet criteria for psychiatric inpatient admission. Supportive therapy provided about ongoing stressors. Discussed crisis plan, support from social network, calling 911, coming to the Emergency Department, and calling Suicide Hotline.  Subjective:   [name] [name] is a 53 y.o. male patient admitted with alcohol abuse and requesting detox treatment.  HPI:  Patient seen chart reviewed.  The patient is a 53 year old single African American man who is being admitted to the medical floor because of alcohol detox.  Patient admitted history of anxiety and depression however currently not seeing any psychiatrist or any followup.  He admitted to death into drinking 2 weeks ago and drinking at least 12 packs a day and then decided to have a lot of physical symptoms.  He gets scared and wanted to get help.  Patient endorsed some time he feels very anxious and nervous.  Denies any crying spells, suicidal thought, hallucination, paranoia or any mania.  He lives by himself.  He has 2 children who lives in [place].  He is very concerned about his high liver enzymes and this time he is a serious to stop drinking.  In the past he has been to [place], [place], [place] and [place] [facility].  Patient remembered a good response when he was  seeing therapist and getting help at [facility] .  The patient is requesting a long-term care to prevent further relapse.  Patient still has shakes and tremors but he is overall feeling medically better.  She denies any previous history of suicidal attempt or any psychosis.  Patient is alert and oriented and he is relevant and conversation.  He has some support from his parents.  Patient is requesting a long-term program and he was to continue at [facility] [place].  He has high liver enzymes.  Patient denies any other drugs or any substance use.    Past Psychiatric History: Past Medical History  Diagnosis Date  . Alcohol abuse   . Sarcoidosis of lung   . Reflux   . Depression   . Anxiety   . Pancreatitis   . Carpal tunnel syndrome of right wrist     reports that he has been smoking Cigars.  He does not have any smokeless tobacco history on file. He reports that he drinks alcohol. He reports that he does not use illicit drugs. Family History  Problem Relation Age of Onset  . Diabetes Father   . Hypertension Father   . Hypertension Paternal Grandfather   . Alcohol abuse Paternal Grandfather   . Alcohol abuse Maternal Grandfather   . Alcohol abuse Maternal Grandmother   . Alcohol abuse Paternal Grandmother    Family History Substance Abuse: No Family Supports: No Living Arrangements: Alone Can  pt return to current living arrangement?: Yes Abuse/Neglect [facility]) Physical Abuse: Denies Verbal Abuse: Denies Sexual Abuse: Denies Allergies:   Allergies  Allergen Reactions  . Oxycodone Other (See Comments)    abd pain    ACT Assessment Complete:  Yes:    Educational Status    Risk to Self: Risk to self Suicidal Ideation: No Suicidal Intent: No Is patient at risk for suicide?: No Suicidal Plan?: No Access to Means: No What has been your use of drugs/alcohol within the last 12 months?: Abusing Alcohol Previous Attempts/Gestures: No How many times?: 0 Other  Self Harm Risks: None Triggers for Past Attempts: None known Intentional Self Injurious Behavior: None Family Suicide History: No Recent stressful life event(s): Other (Comment) (Medical issues, Chronic SA Use) Persecutory voices/beliefs?: No Depression: Yes Depression Symptoms: Feeling angry/irritable Substance abuse history and/or treatment for substance abuse?: Yes Suicide prevention information given to non-admitted patients: Not applicable  Risk to Others: Risk to Others Homicidal Ideation: No Thoughts of Harm to Others: No Current Homicidal Intent: No Current Homicidal Plan: No Access to Homicidal Means: No Identified Victim: None History of harm to others?: No Assessment of Violence: None Noted Violent Behavior Description: None Does patient have access to weapons?: No Criminal Charges Pending?: No Does patient have a court date: No  Abuse: Abuse/Neglect Assessment (Assessment to be complete while patient is alone) Physical Abuse: Denies Verbal Abuse: Denies Sexual Abuse: Denies Exploitation of patient/patient's resources: Denies Self-Neglect: Denies  Prior Inpatient Therapy: Prior Inpatient Therapy Prior Inpatient Therapy: Yes Prior Therapy Dates: 2014,2009,2007 Prior Therapy Facilty/Provider(s): [facility] Reason for Treatment: Detox/Rehab  Prior Outpatient Therapy: Prior Outpatient Therapy Prior Outpatient Therapy: Yes Prior Therapy Dates: Patient reports he followed up with psychiatrist at [facility] years ago [facility] noted from prior assessment) Reason for Treatment: Med Managemenrt  Additional Information: Additional Information 1:1 In Past 12 Months?: No CIRT Risk: No Elopement Risk: No Does patient have medical clearance?: No                  Objective: Blood pressure 144/96, pulse 76, temperature 99.4 F (37.4 C), temperature source Oral, resp. rate 18, height '5\' 9"'  (1.753 m), weight 145 lb 8.1 oz (66 kg), SpO2 95.00%.Body mass index  is 21.48 kg/(m^2). Results for orders placed during the hospital encounter of 02/17/14 (from the past 72 hour(s))  URINALYSIS, ROUTINE W REFLEX MICROSCOPIC     Status: Abnormal   Collection Time    02/17/14  6:23 PM      Result Value Ref Range   Color, Urine YELLOW  YELLOW   APPearance CLEAR  CLEAR   Specific Gravity, Urine 1.005  1.005 - 1.030   pH 6.0  5.0 - 8.0   Glucose, UA NEGATIVE  NEGATIVE mg/dL   Hgb urine dipstick TRACE (*) NEGATIVE   Bilirubin Urine NEGATIVE  NEGATIVE   Ketones, ur NEGATIVE  NEGATIVE mg/dL   Protein, ur NEGATIVE  NEGATIVE mg/dL   Urobilinogen, UA 0.2  0.0 - 1.0 mg/dL   Nitrite NEGATIVE  NEGATIVE   Leukocytes, UA NEGATIVE  NEGATIVE  URINE RAPID DRUG SCREEN (HOSP PERFORMED)     Status: None   Collection Time    02/17/14  6:23 PM      Result Value Ref Range   Opiates NONE DETECTED  NONE DETECTED   Cocaine NONE DETECTED  NONE DETECTED   Benzodiazepines NONE DETECTED  NONE DETECTED   Amphetamines NONE DETECTED  NONE DETECTED   Tetrahydrocannabinol NONE DETECTED  NONE DETECTED  Barbiturates NONE DETECTED  NONE DETECTED   Comment:            DRUG SCREEN FOR MEDICAL PURPOSES     ONLY.  IF CONFIRMATION IS NEEDED     FOR ANY PURPOSE, NOTIFY LAB     WITHIN 5 DAYS.                LOWEST DETECTABLE LIMITS     FOR URINE DRUG SCREEN     Drug Class       Cutoff (ng/mL)     Amphetamine      1000     Barbiturate      200     Benzodiazepine   161     Tricyclics       096     Opiates          300     Cocaine          300     THC              50  URINE MICROSCOPIC-ADD ON     Status: None   Collection Time    02/17/14  6:23 PM      Result Value Ref Range   Squamous Epithelial / LPF RARE  RARE   RBC / HPF 0-2  <3 RBC/hpf   Bacteria, UA RARE  RARE  CBC WITH DIFFERENTIAL     Status: Abnormal   Collection Time    02/17/14  6:24 PM      Result Value Ref Range   WBC 6.0  4.0 - 10.5 K/uL   RBC 5.07  4.22 - 5.81 MIL/uL   Hemoglobin 13.8  13.0 - 17.0 g/dL   HCT  40.6  39.0 - 52.0 %   MCV 80.1  78.0 - 100.0 fL   MCH 27.2  26.0 - 34.0 pg   MCHC 34.0  30.0 - 36.0 g/dL   RDW 16.1 (*) 11.5 - 15.5 %   Platelets 105 (*) 150 - 400 K/uL   Comment: PLATELET COUNT CONFIRMED BY SMEAR     SPECIMEN CHECKED FOR CLOTS     REPEATED TO VERIFY   Neutrophils Relative % 52  43 - 77 %   Neutro Abs 3.1  1.7 - 7.7 K/uL   Lymphocytes Relative 33  12 - 46 %   Lymphs Abs 2.0  0.7 - 4.0 K/uL   Monocytes Relative 13 (*) 3 - 12 %   Monocytes Absolute 0.8  0.1 - 1.0 K/uL   Eosinophils Relative 2  0 - 5 %   Eosinophils Absolute 0.1  0.0 - 0.7 K/uL   Basophils Relative 1  0 - 1 %   Basophils Absolute 0.0  0.0 - 0.1 K/uL  COMPREHENSIVE METABOLIC PANEL     Status: Abnormal   Collection Time    02/17/14  6:24 PM      Result Value Ref Range   Sodium 136 (*) 137 - 147 mEq/L   Potassium 4.1  3.7 - 5.3 mEq/L   Chloride 90 (*) 96 - 112 mEq/L   CO2 25  19 - 32 mEq/L   Glucose, Bld 119 (*) 70 - 99 mg/dL   BUN 7  6 - 23 mg/dL   Creatinine, Ser 0.90  0.50 - 1.35 mg/dL   Calcium 9.3  8.4 - 10.5 mg/dL   Total Protein 9.0 (*) 6.0 - 8.3 g/dL   Albumin 4.3  3.5 - 5.2 g/dL   AST 778 (*)  0 - 37 U/L   ALT 553 (*) 0 - 53 U/L   Alkaline Phosphatase 47  39 - 117 U/L   Total Bilirubin 0.6  0.3 - 1.2 mg/dL   GFR calc non Af Amer >90  >90 mL/min   GFR calc Af Amer >90  >90 mL/min   Comment: (NOTE)     The eGFR has been calculated using the CKD EPI equation.     This calculation has not been validated in all clinical situations.     eGFR's persistently <90 mL/min signify possible Chronic Kidney     Disease.  LIPASE, BLOOD     Status: Abnormal   Collection Time    02/17/14  6:24 PM      Result Value Ref Range   Lipase 187 (*) 11 - 59 U/L  ETHANOL     Status: Abnormal   Collection Time    02/17/14  6:24 PM      Result Value Ref Range   Alcohol, Ethyl (B) 358 (*) 0 - 11 mg/dL   Comment:            LOWEST DETECTABLE LIMIT FOR     SERUM ALCOHOL IS 11 mg/dL     FOR MEDICAL PURPOSES  ONLY  COMPREHENSIVE METABOLIC PANEL     Status: Abnormal   Collection Time    02/18/14  6:45 AM      Result Value Ref Range   Sodium 140  137 - 147 mEq/L   Potassium 4.4  3.7 - 5.3 mEq/L   Chloride 100  96 - 112 mEq/L   Comment: DELTA CHECK NOTED   CO2 26  19 - 32 mEq/L   Glucose, Bld 89  70 - 99 mg/dL   BUN 6  6 - 23 mg/dL   Creatinine, Ser 0.90  0.50 - 1.35 mg/dL   Calcium 8.6  8.4 - 10.5 mg/dL   Total Protein 7.7  6.0 - 8.3 g/dL   Albumin 3.7  3.5 - 5.2 g/dL   AST 585 (*) 0 - 37 U/L   Comment: REPEATED TO VERIFY   ALT 447 (*) 0 - 53 U/L   Alkaline Phosphatase 39  39 - 117 U/L   Total Bilirubin 0.8  0.3 - 1.2 mg/dL   GFR calc non Af Amer >90  >90 mL/min   GFR calc Af Amer >90  >90 mL/min   Comment: (NOTE)     The eGFR has been calculated using the CKD EPI equation.     This calculation has not been validated in all clinical situations.     eGFR's persistently <90 mL/min signify possible Chronic Kidney     Disease.  ETHANOL     Status: Abnormal   Collection Time    02/18/14  6:45 AM      Result Value Ref Range   Alcohol, Ethyl (B) 106 (*) 0 - 11 mg/dL   Comment:            LOWEST DETECTABLE LIMIT FOR     SERUM ALCOHOL IS 11 mg/dL     FOR MEDICAL PURPOSES ONLY  LIPASE, BLOOD     Status: Abnormal   Collection Time    02/18/14  6:45 AM      Result Value Ref Range   Lipase 170 (*) 11 - 59 U/L  HEPATITIS PANEL, ACUTE     Status: None   Collection Time    02/18/14 11:30 AM      Result Value Ref Range  Hepatitis B Surface Ag NEGATIVE  NEGATIVE   HCV Ab NEGATIVE  NEGATIVE   Hep A IgM NON REACTIVE  NON REACTIVE   Hep B C IgM NON REACTIVE  NON REACTIVE   Comment: (NOTE)     High levels of Hepatitis B Core IgM antibody are detectable     during the acute stage of Hepatitis B. This antibody is used     to differentiate current from past HBV infection.     Performed at [facility]  CBC     Status: Abnormal   Collection Time    02/18/14  6:32 PM      Result  Value Ref Range   WBC 4.1  4.0 - 10.5 K/uL   RBC 4.74  4.22 - 5.81 MIL/uL   Hemoglobin 12.6 (*) 13.0 - 17.0 g/dL   HCT 38.1 (*) 39.0 - 52.0 %   MCV 80.4  78.0 - 100.0 fL   MCH 26.6  26.0 - 34.0 pg   MCHC 33.1  30.0 - 36.0 g/dL   RDW 15.6 (*) 11.5 - 15.5 %   Platelets 100 (*) 150 - 400 K/uL   Comment: REPEATED TO VERIFY     SPECIMEN CHECKED FOR CLOTS     PLATELET COUNT CONFIRMED BY SMEAR  CREATININE, SERUM     Status: None   Collection Time    02/18/14  6:32 PM      Result Value Ref Range   Creatinine, Ser 0.95  0.50 - 1.35 mg/dL   GFR calc non Af Amer >90  >90 mL/min   GFR calc Af Amer >90  >90 mL/min   Comment: (NOTE)     The eGFR has been calculated using the CKD EPI equation.     This calculation has not been validated in all clinical situations.     eGFR's persistently <90 mL/min signify possible Chronic Kidney     Disease.  CBC     Status: Abnormal   Collection Time    02/19/14  4:43 AM      Result Value Ref Range   WBC 4.6  4.0 - 10.5 K/uL   RBC 4.56  4.22 - 5.81 MIL/uL   Hemoglobin 12.2 (*) 13.0 - 17.0 g/dL   HCT 37.6 (*) 39.0 - 52.0 %   MCV 82.5  78.0 - 100.0 fL   MCH 26.8  26.0 - 34.0 pg   MCHC 32.4  30.0 - 36.0 g/dL   RDW 15.7 (*) 11.5 - 15.5 %   Platelets 101 (*) 150 - 400 K/uL   Comment: CONSISTENT WITH PREVIOUS RESULT  COMPREHENSIVE METABOLIC PANEL     Status: Abnormal   Collection Time    02/19/14  4:43 AM      Result Value Ref Range   Sodium 135 (*) 137 - 147 mEq/L   Potassium 3.6 (*) 3.7 - 5.3 mEq/L   Chloride 97  96 - 112 mEq/L   CO2 25  19 - 32 mEq/L   Glucose, Bld 90  70 - 99 mg/dL   BUN 7  6 - 23 mg/dL   Creatinine, Ser 0.99  0.50 - 1.35 mg/dL   Calcium 9.1  8.4 - 10.5 mg/dL   Total Protein 7.6  6.0 - 8.3 g/dL   Albumin 3.7  3.5 - 5.2 g/dL   AST 290 (*) 0 - 37 U/L   ALT 318 (*) 0 - 53 U/L   Alkaline Phosphatase 40  39 - 117 U/L   Total  Bilirubin 0.8  0.3 - 1.2 mg/dL   GFR calc non Af Amer >90  >90 mL/min   GFR calc Af Amer >90  >90 mL/min    Comment: (NOTE)     The eGFR has been calculated using the CKD EPI equation.     This calculation has not been validated in all clinical situations.     eGFR's persistently <90 mL/min signify possible Chronic Kidney     Disease.   Labs are reviewed and are pertinent for high liver enzymes and high blood alcohol level.  Current Facility-Administered Medications  Medication Dose Route Frequency Provider Last Rate Last Dose  . 0.9 %  sodium chloride infusion   Intravenous Continuous Erline Hau, MD 100 mL/hr at 02/19/14 1145    . enoxaparin (LOVENOX) injection 40 mg  40 mg Subcutaneous Q24H Erline Hau, MD   40 mg at 02/18/14 1953  . escitalopram (LEXAPRO) tablet 10 mg  10 mg Oral Daily Erline Hau, MD   10 mg at 02/19/14 6837  . folic acid (FOLVITE) tablet 1 mg  1 mg Oral Daily Estela Leonie Green, MD   1 mg at 02/19/14 [phone]  . gabapentin (NEURONTIN) capsule 200 mg  200 mg Oral TID Erline Hau, MD   200 mg at 02/19/14 0958  . lip balm (CARMEX) ointment   Topical PRN Erline Hau, MD      . loperamide (IMODIUM) capsule 2 mg  2 mg Oral TID PRN Gardiner Barefoot, NP   2 mg at 02/19/14 0610  . LORazepam (ATIVAN) tablet 1 mg  1 mg Oral Q6H PRN Erline Hau, MD       Or  . LORazepam (ATIVAN) injection 1 mg  1 mg Intravenous Q6H PRN Erline Hau, MD      . LORazepam (ATIVAN) tablet 0-4 mg  0-4 mg Oral Q6H Estela Leonie Green, MD   2 mg at 02/19/14 0530   Followed by  . [START ON 02/20/2014] LORazepam (ATIVAN) tablet 0-4 mg  0-4 mg Oral Q12H Estela Leonie Green, MD      . morphine 2 MG/ML injection 1 mg  1 mg Intravenous Q4H PRN Erline Hau, MD      . multivitamin with minerals tablet 1 tablet  1 tablet Oral Daily Estela Leonie Green, MD   1 tablet at 02/19/14 [phone]  . ondansetron (ZOFRAN) tablet 4 mg  4 mg Oral Q6H PRN Erline Hau, MD       Or  .  ondansetron [facility]) injection 4 mg  4 mg Intravenous Q6H PRN Erline Hau, MD      . pantoprazole (PROTONIX) EC tablet 40 mg  40 mg Oral Daily Erline Hau, MD   40 mg at 02/19/14 5208  . senna-docusate (Senokot-S) tablet 1 tablet  1 tablet Oral QHS PRN Erline Hau, MD      . sodium chloride 0.9 % injection 3 mL  3 mL Intravenous Q12H Estela Leonie Green, MD   3 mL at 02/18/14 2200  . thiamine (VITAMIN B-1) tablet 100 mg  100 mg Oral Daily Erline Hau, MD   100 mg at 02/19/14 0957   Or  . thiamine (B-1) injection 100 mg  100 mg Intravenous Daily Estela Leonie Green, MD        Psychiatric Specialty Exam:  Blood pressure 144/96, pulse 76, temperature 99.4 F (37.4 C), temperature source Oral, resp. rate 18, height '5\' 9"'  (1.753 m), weight 145 lb 8.1 oz (66 kg), SpO2 95.00%.Body mass index is 21.48 kg/(m^2).  General Appearance: Disheveled  Eye Contact::  Good  Speech:  Slow  Volume:  Decreased  Mood:  Anxious  Affect:  Constricted  Thought Process:  Coherent and Logical  Orientation:  Full (Time, Place, and Person)  Thought Content:  Rumination  Suicidal Thoughts:  No  Homicidal Thoughts:  No  Memory:  Immediate;   Fair Recent;   Fair Remote;   Fair  Judgement:  Intact  Insight:  Fair  Psychomotor Activity:  Tremor  Concentration:  Fair  Recall:  [employer] of Knowledge:Fair  Language: Fair  Akathisia:  No  Handed:  Right  AIMS (if indicated):     Assets:  Communication Skills Desire for Improvement Housing  Sleep:      Musculoskeletal: Strength & Muscle Tone: within normal limits Gait & Station: Lying on the bed Patient leans: N/A  Treatment Plan Summary: Medication management, continue current detox protocol.  Patient is interested in long-term rehabilitation program.  Patient does not require inpatient psychiatric services at this time.  Social worker to contact substance abuse program.  Patient  also liked to followup with [facility] [place].  Please call [phone] if you have any further question.  ARFEEN,SYED T. 02/19/2014 2:19 PM

## 2014-02-20 LAB — COMPREHENSIVE METABOLIC PANEL
ALT: 231 U/L — ABNORMAL HIGH (ref 0–53)
AST: 164 U/L — ABNORMAL HIGH (ref 0–37)
Albumin: 3.4 g/dL — ABNORMAL LOW (ref 3.5–5.2)
Alkaline Phosphatase: 36 U/L — ABNORMAL LOW (ref 39–117)
BUN: 7 mg/dL (ref 6–23)
CO2: 27 mEq/L (ref 19–32)
Calcium: 8.7 mg/dL (ref 8.4–10.5)
Chloride: 102 mEq/L (ref 96–112)
Creatinine, Ser: 0.99 mg/dL (ref 0.50–1.35)
GFR calc Af Amer: >90 mL/min (ref >90)
GFR calc non Af Amer: >90 mL/min (ref >90)
Glucose, Bld: 88 mg/dL (ref 70–99)
Potassium: 3.7 mEq/L (ref 3.7–5.3)
Sodium: 141 mEq/L (ref 137–147)
Total Bilirubin: 0.6 mg/dL (ref 0.3–1.2)
Total Protein: 6.8 g/dL (ref 6.0–8.3)

## 2014-02-20 MED ORDER — THIAMINE HCL 100 MG PO TABS: 100.0000 mg | ORAL_TABLET | Freq: Every day | ORAL | Status: DC

## 2014-02-20 MED ORDER — FOLIC ACID 1 MG PO TABS: 1.0000 mg | ORAL_TABLET | Freq: Every day | ORAL | Status: DC

## 2014-02-20 MED ORDER — PANTOPRAZOLE SODIUM 40 MG PO TBEC: 40.0000 mg | DELAYED_RELEASE_TABLET | Freq: Every day | ORAL | Status: DC

## 2014-02-20 NOTE — Discharge Summary (Signed)
Physician Discharge Summary  [name] [name] RJJ:[id number] DOB: 03-02-1961 DOA: 02/17/2014  PCP: Lucretia Kern., DO  Admit date: 02/17/2014 Discharge date: 02/20/2014  Time spent: 45 minutes  Recommendations for Outpatient Follow-up:  -Will be discharged home today in stable condition. -Advised to follow up with PCP in 2 weeks. -Was provided with ETOH resources by SW.   Discharge Diagnoses:  Active Problems:   Alcohol withdrawal   Alcohol abuse   Alcoholic hepatitis   Transaminitis   Unspecified gastritis and gastroduodenitis without mention of hemorrhage   Discharge Condition: Stable and improved  Filed Weights   02/17/14 1753 02/18/14 1610  Weight: 68.04 kg (150 lb) 66 kg (145 lb 8.1 oz)    History of present illness:  Patient is a 53 year old man with past medical history significant for alcohol abuse, depression, anxiety, who presented to [place] [facility] yesterday after an alcohol binge requesting detox. He was supposed to go to behavioral health, however they requested medical clearance given his elevated LFTs. Hospitalist admission was requested.   Hospital Course:   ETOH Abuse/Withdrawal  -No further signs of withdrawal. -Thiamine/folate. -Declines inpatient treatment. -Was provided with OP ETOH resources by SW.  Transaminitis  -Improving.  -Hepatitis panel negative.  -RUQ [place] with hepatic steatosis.  -Likely fatty liver related to alcoholic hepatitis.  Alcoholic Gastritis  -Continue PPI.   Procedures:  None   Consultations:  None  Discharge Instructions  Discharge Orders   Future Orders Complete By Expires   Discontinue IV  As directed    Increase activity slowly  As directed        Medication List         doxepin 25 MG capsule  Commonly known as:  SINEQUAN  Take 1 capsule (25 mg total) by mouth at bedtime as needed and may repeat dose one time if needed (insomnia).     escitalopram 10 MG tablet  Commonly known as:  LEXAPRO    Take 1 tablet (10 mg total) by mouth daily.     folic acid 1 MG tablet  Commonly known as:  FOLVITE  Take 1 tablet (1 mg total) by mouth daily.     gabapentin 100 MG capsule  Commonly known as:  NEURONTIN  Take 2 capsules (200 mg total) by mouth 3 (three) times daily.     multivitamin with minerals Tabs tablet  Take 1 tablet by mouth daily. Please purchase over the counter in order to keep taking this treatment.     pantoprazole 40 MG tablet  Commonly known as:  PROTONIX  Take 1 tablet (40 mg total) by mouth daily.     thiamine 100 MG tablet  Take 1 tablet (100 mg total) by mouth daily.     vitamin B-12 100 MCG tablet  Commonly known as:  CYANOCOBALAMIN  Take 1 tablet (100 mcg total) by mouth daily.     vitamin C 250 MG tablet  Commonly known as:  ASCORBIC ACID  Take 1 tablet (250 mg total) by mouth daily.       Allergies  Allergen Reactions  . Oxycodone Other (See Comments)    abd pain       Follow-up Information   Call [facility] [place] (To schedule assessment for substance abuse treatment)    Contact information:   [address], [place] [address] [phone]      Follow up with Colin Benton R., DO. Schedule an appointment as soon as possible for a  visit in 2 weeks.   Specialty:  Family Medicine   Contact information:   [place] [address] [phone]        The results of significant diagnostics from this hospitalization (including imaging, microbiology, ancillary and laboratory) are listed below for reference.    Significant Diagnostic Studies: Dg Knee Complete 4 Views Left  02/17/2014   CLINICAL DATA:  Bilateral knee pain.  No known injury.  EXAM: LEFT KNEE - COMPLETE 4+ VIEW  COMPARISON:  None.  FINDINGS: There is no evidence of fracture, dislocation, or joint effusion. 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 Mild  medial compartment narrowing with small marginal spurs.  IMPRESSION: No acute osseous findings.   Electronically Signed   By: Jorje Guild M.D.   On: 02/17/2014 18:55   Dg Knee Complete 4 Views Right  02/17/2014   CLINICAL DATA:  Bilateral knee pain.  No known injury.  EXAM: RIGHT KNEE - COMPLETE 4+ VIEW  COMPARISON:  None.  FINDINGS: There is no evidence of fracture, dislocation, or joint effusion. There is no evidence of arthropathy or other focal bone abnormality. Soft tissues are unremarkable.  IMPRESSION: Negative.   Electronically Signed   By: Jorje Guild M.D.   On: 02/17/2014 18:56   US Abdomen Limited Ruq  02/19/2014   CLINICAL DATA:  Elevated LFTs  EXAM: US ABDOMEN LIMITED - RIGHT UPPER QUADRANT  COMPARISON:  08/23/2013, 09/01/2013  FINDINGS: Gallbladder:  Trace amount of intraluminal gallbladder sludge. Negative for definite gallstones. Normal wall thickness measuring 2.2 mm. No Murphy sign elicited.  Common bile duct:  Diameter: 9.5 mm.  Liver:  Diffuse increased echogenicity compatible with hepatic steatosis or fatty infiltration. No definite focal hepatic abnormality. No intrahepatic biliary dilatation. Patent portal vein with normal hepatopetal flow.  IMPRESSION: Trace amount of gallbladder sludge.  Negative for gallstones or acute cholecystitis  Hepatic steatosis  9.5 mm mildly dilated common bile duct, grossly stable, previously measuring 8 mm. No associated intrahepatic biliary dilatation.   Electronically Signed   By: Daryll Brod M.D.   On: 02/19/2014 09:50    Microbiology: No results found for this or any previous visit (from the past 240 hour(s)).   Labs: Basic Metabolic Panel:  Recent Labs Lab 02/17/14 1824 02/18/14 0645 02/18/14 1832 02/19/14 0443 02/20/14 0415  NA 136* 140  --  135* 141  K 4.1 4.4  --  3.6* 3.7  CL 90* 100  --  97 102  CO2 25 26  --  25 27  GLUCOSE 119* 89  --  90 88  BUN 7 6  --  7 7  CREATININE 0.90 0.90 0.95 0.99 0.99  CALCIUM 9.3 8.6   --  9.1 8.7   Liver Function Tests:  Recent Labs Lab 02/17/14 1824 02/18/14 0645 02/19/14 0443 02/20/14 0415  AST 778* 585* 290* 164*  ALT 553* 447* 318* 231*  ALKPHOS 47 39 40 36*  BILITOT 0.6 0.8 0.8 0.6  PROT 9.0* 7.7 7.6 6.8  ALBUMIN 4.3 3.7 3.7 3.4*    Recent Labs Lab 02/17/14 1824 02/18/14 0645  LIPASE 187* 170*   No results found for this basename: AMMONIA,  in the last 168 hours CBC:  Recent Labs Lab 02/17/14 1824 02/18/14 1832 02/19/14 0443  WBC 6.0 4.1 4.6  NEUTROABS 3.1  --   --   HGB 13.8 12.6* 12.2*  HCT 40.6 38.1* 37.6*  MCV  80.1 80.4 82.5  PLT 105* 100* 101*   Cardiac Enzymes: No results found for this basename: CKTOTAL, CKMB, CKMBINDEX, TROPONINI,  in the last 168 hours BNP: BNP (last 3 results) No results found for this basename: PROBNP,  in the last 8760 hours CBG: No results found for this basename: GLUCAP,  in the last 168 hours     Signed:  Lelon Frohlich  [facility] Pager: [phone] 02/20/2014, 3:50 PM

## 2014-02-20 NOTE — Progress Notes (Signed)
Clinical Social Work  CSW met with patient at bedside. Patient reports he is feeling better and is ready to DC. Patient has already spoken to his parents who will provide transportation home. CSW inquired if patient had reviewed substance abuse information any further. Patient states that he will go home and follow up with [facility]. Patient reports he has been reviewing [facility] information as well and feels that it would be beneficial to follow up. Patient reports no concerns at this time and feels positive he can remain sober if he follows up with treatment. Patient politely declines inpatient treatment and reports he wants to go home.  MD is aware of patient's plans.  [place], [place] [phone]

## 2014-02-20 NOTE — Progress Notes (Signed)
02/20/14  1215  Reviewed discharge instructions with patient. Patient verbalized understanding of discharged. Copy of discharge papers and prescriptions given to patient.

## 2014-03-16 ENCOUNTER — Inpatient Hospital Stay (HOSPITAL_COMMUNITY)
Admission: EM | Admit: 2014-03-16 | Discharge: 2014-03-20 | DRG: 897 | Disposition: A | Discharge status: Home / Self Care | Payer: Federal, State, Local not specified - Other | Source: Intra-hospital | Attending: Psychiatry | Admitting: Psychiatry

## 2014-03-16 ENCOUNTER — Encounter: Payer: Self-pay | Admitting: Family Medicine

## 2014-03-16 ENCOUNTER — Encounter (HOSPITAL_COMMUNITY): Payer: Self-pay | Admitting: Emergency Medicine

## 2014-03-16 ENCOUNTER — Emergency Department (HOSPITAL_COMMUNITY)
Admission: EM | Admit: 2014-03-16 | Discharge: 2014-03-16 | Disposition: A | Discharge status: Other Acute Inpatient Hospital | Payer: Federal, State, Local not specified - Other | Attending: Emergency Medicine | Admitting: Emergency Medicine

## 2014-03-16 ENCOUNTER — Encounter (HOSPITAL_COMMUNITY): Payer: Self-pay | Admitting: *Deleted

## 2014-03-16 DIAGNOSIS — F17200 Nicotine dependence, unspecified, uncomplicated: Secondary | ICD-10-CM | POA: Insufficient documentation

## 2014-03-16 DIAGNOSIS — F1020 Alcohol dependence, uncomplicated: Secondary | ICD-10-CM

## 2014-03-16 DIAGNOSIS — Z79899 Other long term (current) drug therapy: Secondary | ICD-10-CM | POA: Insufficient documentation

## 2014-03-16 DIAGNOSIS — F1010 Alcohol abuse, uncomplicated: Secondary | ICD-10-CM

## 2014-03-16 DIAGNOSIS — F10939 Alcohol use, unspecified with withdrawal, unspecified: Secondary | ICD-10-CM

## 2014-03-16 DIAGNOSIS — F10239 Alcohol dependence with withdrawal, unspecified: Secondary | ICD-10-CM

## 2014-03-16 DIAGNOSIS — Z8669 Personal history of other diseases of the nervous system and sense organs: Secondary | ICD-10-CM | POA: Insufficient documentation

## 2014-03-16 DIAGNOSIS — K219 Gastro-esophageal reflux disease without esophagitis: Secondary | ICD-10-CM | POA: Diagnosis present

## 2014-03-16 DIAGNOSIS — R52 Pain, unspecified: Secondary | ICD-10-CM | POA: Insufficient documentation

## 2014-03-16 DIAGNOSIS — IMO0002 Reserved for concepts with insufficient information to code with codable children: Secondary | ICD-10-CM | POA: Insufficient documentation

## 2014-03-16 DIAGNOSIS — F3289 Other specified depressive episodes: Secondary | ICD-10-CM | POA: Insufficient documentation

## 2014-03-16 DIAGNOSIS — F1094 Alcohol use, unspecified with alcohol-induced mood disorder: Secondary | ICD-10-CM | POA: Diagnosis present

## 2014-03-16 DIAGNOSIS — F329 Major depressive disorder, single episode, unspecified: Secondary | ICD-10-CM | POA: Diagnosis present

## 2014-03-16 DIAGNOSIS — F411 Generalized anxiety disorder: Secondary | ICD-10-CM | POA: Diagnosis present

## 2014-03-16 DIAGNOSIS — F32A Depression, unspecified: Secondary | ICD-10-CM

## 2014-03-16 DIAGNOSIS — G4700 Insomnia, unspecified: Secondary | ICD-10-CM | POA: Diagnosis present

## 2014-03-16 DIAGNOSIS — Z8619 Personal history of other infectious and parasitic diseases: Secondary | ICD-10-CM | POA: Insufficient documentation

## 2014-03-16 DIAGNOSIS — Z833 Family history of diabetes mellitus: Secondary | ICD-10-CM

## 2014-03-16 DIAGNOSIS — Z87891 Personal history of nicotine dependence: Secondary | ICD-10-CM

## 2014-03-16 DIAGNOSIS — Z8249 Family history of ischemic heart disease and other diseases of the circulatory system: Secondary | ICD-10-CM

## 2014-03-16 LAB — RAPID URINE DRUG SCREEN, HOSP PERFORMED
Amphetamines: NOT DETECTED
Barbiturates: NOT DETECTED
Benzodiazepines: NOT DETECTED
Cocaine: NOT DETECTED
Opiates: NOT DETECTED
Tetrahydrocannabinol: NOT DETECTED

## 2014-03-16 LAB — CBC WITH DIFFERENTIAL/PLATELET
Basophils Absolute: 0 10*3/uL (ref 0.0–0.1)
Basophils Relative: 0 % (ref 0–1)
Eosinophils Absolute: 0.1 10*3/uL (ref 0.0–0.7)
Eosinophils Relative: 2 % (ref 0–5)
HCT: 38.8 % — ABNORMAL LOW (ref 39.0–52.0)
Hemoglobin: 13 g/dL (ref 13.0–17.0)
Lymphocytes Relative: 35 % (ref 12–46)
Lymphs Abs: 2.7 10*3/uL (ref 0.7–4.0)
MCH: 26.7 pg (ref 26.0–34.0)
MCHC: 33.5 g/dL (ref 30.0–36.0)
MCV: 79.8 fL (ref 78.0–100.0)
Monocytes Absolute: 0.8 10*3/uL (ref 0.1–1.0)
Monocytes Relative: 10 % (ref 3–12)
Neutro Abs: 4.2 10*3/uL (ref 1.7–7.7)
Neutrophils Relative %: 54 % (ref 43–77)
Platelets: 161 10*3/uL (ref 150–400)
RBC: 4.86 MIL/uL (ref 4.22–5.81)
RDW: 15.9 % — ABNORMAL HIGH (ref 11.5–15.5)
WBC: 7.8 10*3/uL (ref 4.0–10.5)

## 2014-03-16 LAB — BASIC METABOLIC PANEL
BUN: 8 mg/dL (ref 6–23)
CO2: 27 mEq/L (ref 19–32)
Calcium: 8.8 mg/dL (ref 8.4–10.5)
Chloride: 95 mEq/L — ABNORMAL LOW (ref 96–112)
Creatinine, Ser: 0.98 mg/dL (ref 0.50–1.35)
GFR calc Af Amer: >90 mL/min (ref >90)
GFR calc non Af Amer: >90 mL/min (ref >90)
Glucose, Bld: 121 mg/dL — ABNORMAL HIGH (ref 70–99)
Potassium: 4 mEq/L (ref 3.7–5.3)
Sodium: 138 mEq/L (ref 137–147)

## 2014-03-16 LAB — ETHANOL: Alcohol, Ethyl (B): 231 mg/dL — ABNORMAL HIGH (ref 0–11)

## 2014-03-16 MED ORDER — ADULT MULTIVITAMIN W/MINERALS CH
1.0000 | ORAL_TABLET | Freq: Every day | ORAL | Status: DC
  Administered 2014-03-17 – 2014-03-20 (×4): 1 via ORAL
  Filled 2014-03-16 (×5): qty 1

## 2014-03-16 MED ORDER — CHLORDIAZEPOXIDE HCL 25 MG PO CAPS
25.0000 mg | ORAL_CAPSULE | Freq: Three times a day (TID) | ORAL | Status: AC
  Administered 2014-03-18 – 2014-03-19 (×3): 25 mg via ORAL
  Filled 2014-03-16 (×3): qty 1

## 2014-03-16 MED ORDER — CHLORDIAZEPOXIDE HCL 25 MG PO CAPS: 25.0000 mg | ORAL_CAPSULE | Freq: Every day | ORAL | Status: DC

## 2014-03-16 MED ORDER — NICOTINE 21 MG/24HR TD PT24: 21.0000 mg | MEDICATED_PATCH | Freq: Every day | TRANSDERMAL | Status: DC

## 2014-03-16 MED ORDER — CHLORDIAZEPOXIDE HCL 25 MG PO CAPS
25.0000 mg | ORAL_CAPSULE | ORAL | Status: AC
  Administered 2014-03-19 – 2014-03-20 (×2): 25 mg via ORAL
  Filled 2014-03-16 (×2): qty 1

## 2014-03-16 MED ORDER — CHLORDIAZEPOXIDE HCL 25 MG PO CAPS: 25.0000 mg | ORAL_CAPSULE | Freq: Four times a day (QID) | ORAL | Status: AC | PRN

## 2014-03-16 MED ORDER — IBUPROFEN 200 MG PO TABS: 600.0000 mg | ORAL_TABLET | Freq: Three times a day (TID) | ORAL | Status: DC | PRN

## 2014-03-16 MED ORDER — TRIAMCINOLONE ACETONIDE 0.025 % EX OINT
1.0000 | TOPICAL_OINTMENT | Freq: Two times a day (BID) | CUTANEOUS | Status: DC
Start: 2014-03-16 — End: 2014-03-17
  Filled 2014-03-16: qty 80

## 2014-03-16 MED ORDER — VITAMIN B-12 100 MCG PO TABS
100.0000 ug | ORAL_TABLET | Freq: Every day | ORAL | Status: DC
  Administered 2014-03-17 – 2014-03-20 (×4): 100 ug via ORAL
  Filled 2014-03-16 (×5): qty 1

## 2014-03-16 MED ORDER — ONDANSETRON HCL 4 MG PO TABS: 4.0000 mg | ORAL_TABLET | Freq: Four times a day (QID) | ORAL | Status: DC | PRN

## 2014-03-16 MED ORDER — LORAZEPAM 1 MG PO TABS
1.0000 mg | ORAL_TABLET | Freq: Three times a day (TID) | ORAL | Status: DC | PRN
  Administered 2014-03-16: 1 mg via ORAL
  Filled 2014-03-16: qty 1

## 2014-03-16 MED ORDER — ONDANSETRON HCL 4 MG PO TABS: 4.0000 mg | ORAL_TABLET | Freq: Three times a day (TID) | ORAL | Status: DC | PRN

## 2014-03-16 MED ORDER — CHLORDIAZEPOXIDE HCL 25 MG PO CAPS
25.0000 mg | ORAL_CAPSULE | Freq: Four times a day (QID) | ORAL | Status: AC
  Administered 2014-03-16 – 2014-03-18 (×6): 25 mg via ORAL
  Filled 2014-03-16 (×6): qty 1

## 2014-03-16 MED ORDER — HYDROXYZINE PAMOATE 25 MG PO CAPS
25.0000 mg | ORAL_CAPSULE | ORAL | Status: DC | PRN
  Filled 2014-03-16: qty 1

## 2014-03-16 MED ORDER — MAGNESIUM HYDROXIDE 400 MG/5ML PO SUSP
30.0000 mL | Freq: Every day | ORAL | Status: DC | PRN
Start: 2014-03-16 — End: 2014-03-20

## 2014-03-16 MED ORDER — THIAMINE HCL 100 MG/ML IJ SOLN
100.0000 mg | Freq: Once | INTRAMUSCULAR | Status: AC
  Administered 2014-03-16: 100 mg via INTRAMUSCULAR
  Filled 2014-03-16: qty 2

## 2014-03-16 MED ORDER — ESCITALOPRAM OXALATE 10 MG PO TABS
10.0000 mg | ORAL_TABLET | Freq: Every day | ORAL | Status: DC
  Administered 2014-03-17 – 2014-03-20 (×4): 10 mg via ORAL
  Filled 2014-03-16 (×6): qty 1

## 2014-03-16 MED ORDER — FOLIC ACID 1 MG PO TABS
1.0000 mg | ORAL_TABLET | Freq: Every day | ORAL | Status: DC
  Administered 2014-03-17 – 2014-03-20 (×4): 1 mg via ORAL
  Filled 2014-03-16 (×5): qty 1

## 2014-03-16 MED ORDER — ONDANSETRON 4 MG PO TBDP
4.0000 mg | ORAL_TABLET | Freq: Four times a day (QID) | ORAL | Status: AC | PRN
  Administered 2014-03-16: 4 mg via ORAL
  Filled 2014-03-16: qty 1

## 2014-03-16 MED ORDER — ALUM & MAG HYDROXIDE-SIMETH 200-200-20 MG/5ML PO SUSP: 30.0000 mL | ORAL | Status: DC | PRN

## 2014-03-16 MED ORDER — VITAMIN C 250 MG PO TABS
250.0000 mg | ORAL_TABLET | Freq: Every day | ORAL | Status: DC
  Filled 2014-03-16 (×2): qty 1

## 2014-03-16 MED ORDER — ZOLPIDEM TARTRATE 5 MG PO TABS: 5.0000 mg | ORAL_TABLET | Freq: Every evening | ORAL | Status: DC | PRN

## 2014-03-16 MED ORDER — VITAMIN B-1 100 MG PO TABS
100.0000 mg | ORAL_TABLET | Freq: Every day | ORAL | Status: DC
  Administered 2014-03-17 – 2014-03-20 (×4): 100 mg via ORAL
  Filled 2014-03-16 (×5): qty 1

## 2014-03-16 MED ORDER — ACETAMINOPHEN 325 MG PO TABS: 650.0000 mg | ORAL_TABLET | Freq: Four times a day (QID) | ORAL | Status: DC | PRN

## 2014-03-16 MED ORDER — PANTOPRAZOLE SODIUM 40 MG PO TBEC
40.0000 mg | DELAYED_RELEASE_TABLET | Freq: Every day | ORAL | Status: DC
  Administered 2014-03-17 – 2014-03-20 (×4): 40 mg via ORAL
  Filled 2014-03-16 (×5): qty 1

## 2014-03-16 MED ORDER — LORAZEPAM 1 MG PO TABS
1.0000 mg | ORAL_TABLET | Freq: Once | ORAL | Status: AC
  Administered 2014-03-16: 1 mg via ORAL
  Filled 2014-03-16: qty 1

## 2014-03-16 MED ORDER — LOPERAMIDE HCL 2 MG PO CAPS
2.0000 mg | ORAL_CAPSULE | ORAL | Status: AC | PRN
  Administered 2014-03-17: 4 mg via ORAL
  Filled 2014-03-16: qty 2

## 2014-03-16 MED ORDER — HYDROXYZINE HCL 25 MG PO TABS: 25.0000 mg | ORAL_TABLET | Freq: Four times a day (QID) | ORAL | Status: AC | PRN

## 2014-03-16 MED ORDER — GABAPENTIN 100 MG PO CAPS: 200.0000 mg | ORAL_CAPSULE | Freq: Three times a day (TID) | ORAL | Status: DC

## 2014-03-16 MED ORDER — DOXEPIN HCL 25 MG PO CAPS
25.0000 mg | ORAL_CAPSULE | Freq: Every evening | ORAL | Status: DC | PRN
  Administered 2014-03-16 – 2014-03-19 (×5): 25 mg via ORAL
  Filled 2014-03-16 (×5): qty 1
  Filled 2014-03-16: qty 14

## 2014-03-16 MED ORDER — ONDANSETRON 8 MG PO TBDP
8.0000 mg | ORAL_TABLET | Freq: Once | ORAL | Status: AC
  Administered 2014-03-16: 8 mg via ORAL
  Filled 2014-03-16: qty 1

## 2014-03-16 MED ORDER — ACETAMINOPHEN 325 MG PO TABS
650.0000 mg | ORAL_TABLET | ORAL | Status: DC | PRN
Start: 2014-03-16 — End: 2014-03-16

## 2014-03-16 NOTE — ED Provider Notes (Signed)
CSN: [id number]     Arrival date & time 03/16/14  0756 History   First MD Initiated Contact with Patient 03/16/14 [phone]     Chief Complaint  Patient presents with  . detox   . Generalized Body Aches     (Consider location/radiation/quality/duration/timing/severity/associated sxs/prior Treatment) HPI.... patient has long history of alcohol abuse. He wants some help for same. Also complains of general body achiness, anxiety, depression. No suicidal or homicidal ideation. This is a long-standing problem. He has been admitted to the hospital several times in the past.  Past Medical History  Diagnosis Date  . Alcohol abuse   . Sarcoidosis of lung   . Reflux   . Depression   . Anxiety   . Pancreatitis   . Carpal tunnel syndrome of right wrist    Past Surgical History  Procedure Laterality Date  . Knee arthroscopy    . Carpel tunnel release    . Elbow surgery     Family History  Problem Relation Age of Onset  . Diabetes Father   . Hypertension Father   . Hypertension Paternal Grandfather   . Alcohol abuse Paternal Grandfather   . Alcohol abuse Maternal Grandfather   . Alcohol abuse Maternal Grandmother   . Alcohol abuse Paternal Grandmother    History  Substance Use Topics  . Smoking status: Light Tobacco Smoker    Types: Cigars  . Smokeless tobacco: Not on file  . Alcohol Use: 0.0 oz/week     Comment: 3 1/2 40oz beers/daily last use today    Review of Systems  All other systems reviewed and are negative.     Allergies  Oxycodone  Home Medications   Prior to Admission medications   Medication Sig Start Date End Date Taking? Authorizing Provider  doxepin (SINEQUAN) 25 MG capsule Take 1 capsule (25 mg total) by mouth at bedtime as needed and may repeat dose one time if needed (insomnia). 01/23/14  Yes Elmarie Shiley, NP  escitalopram (LEXAPRO) 10 MG tablet Take 1 tablet (10 mg total) by mouth daily. 01/23/14  Yes Elmarie Shiley, NP  folic acid (FOLVITE) 1 MG tablet Take 1  tablet (1 mg total) by mouth daily. 02/20/14  Yes Estela Leonie Green, MD  gabapentin (NEURONTIN) 100 MG capsule Take 2 capsules (200 mg total) by mouth 3 (three) times daily. 01/23/14  Yes Elmarie Shiley, NP  hydrOXYzine (VISTARIL) 25 MG capsule Take 25 mg by mouth every 4 (four) hours as needed for anxiety.   Yes Historical Provider, MD  Multiple Vitamin (MULTIVITAMIN WITH MINERALS) TABS tablet Take 1 tablet by mouth daily. Please purchase over the counter in order to keep taking this treatment. 01/23/14  Yes Elmarie Shiley, NP  ondansetron (ZOFRAN) 4 MG tablet Take 4 mg by mouth every 6 (six) hours as needed for nausea or vomiting.   Yes Historical Provider, MD  pantoprazole (PROTONIX) 40 MG tablet Take 1 tablet (40 mg total) by mouth daily. 02/20/14  Yes Erline Hau, MD  thiamine 100 MG tablet Take 1 tablet (100 mg total) by mouth daily. 02/20/14  Yes Erline Hau, MD  triamcinolone (KENALOG) 0.025 % ointment Apply 1 application topically 2 (two) times daily.   Yes Historical Provider, MD  vitamin B-12 (CYANOCOBALAMIN) 100 MCG tablet Take 1 tablet (100 mcg total) by mouth daily. 01/23/14  Yes Elmarie Shiley, NP  vitamin C (ASCORBIC ACID) 250 MG tablet Take 1 tablet (250 mg total) by mouth daily. 01/23/14  Yes Mickel Baas  Davis, NP   BP 138/92  Pulse 90  Temp(Src) 98.1 F (36.7 C) (Oral)  Resp 18  SpO2 96% Physical Exam  Nursing note and vitals reviewed. Constitutional: He is oriented to person, place, and time. He appears well-developed and well-nourished.  HENT:  Head: Normocephalic and atraumatic.  Eyes: Conjunctivae and EOM are normal. Pupils are equal, round, and reactive to light.  Neck: Normal range of motion. Neck supple.  Cardiovascular: Normal rate, regular rhythm and normal heart sounds.   Pulmonary/Chest: Effort normal and breath sounds normal.  Abdominal: Soft. Bowel sounds are normal.  Musculoskeletal: Normal range of motion.  Neurological: He is alert and  oriented to person, place, and time.  Skin: Skin is warm and dry.  Psychiatric:  Flat affect, depressed    ED Course  Procedures (including critical care time) Labs Review Labs Reviewed  CBC WITH DIFFERENTIAL - Abnormal; Notable for the following:    HCT 38.8 (*)    RDW 15.9 (*)    All other components within normal limits  BASIC METABOLIC PANEL - Abnormal; Notable for the following:    Chloride 95 (*)    Glucose, Bld 121 (*)    All other components within normal limits  ETHANOL - Abnormal; Notable for the following:    Alcohol, Ethyl (B) 231 (*)    All other components within normal limits  URINE RAPID DRUG SCREEN (HOSP PERFORMED)    Imaging Review No results found.   EKG Interpretation None      MDM   Final diagnoses:  Alcohol abuse    Behavioral health consult     Nat Christen, MD 03/16/14 [phone]

## 2014-03-16 NOTE — Progress Notes (Signed)
Error   This encounter was created in error - please disregard. 

## 2014-03-16 NOTE — ED Notes (Signed)
Pt states he has been having body aches.  Would also like help with alcohol detox.  States he has gone through detox before.

## 2014-03-16 NOTE — Progress Notes (Signed)
  CARE MANAGEMENT ED NOTE 03/16/2014  Patient:  [name] [name], [name] [name]   Account Number:  [id number]  Date Initiated:  03/16/2014  Documentation initiated by:  Livia Snellen  Subjective/Objective Assessment:   Patient presents to Ed with body aches and alcohol detox     Subjective/Objective Assessment Detail:     Action/Plan:   Action/Plan Detail:   Anticipated DC Date:       Status Recommendation to Physician:   Result of Recommendation:    Other ED [place]  Other  PCP issues    Choice offered to / List presented to:            Status of service:  Completed, signed off  ED Comments:   ED Comments Detail:  Stormy Fabian  Signed  Progress Notes Service date: 03/16/2014 4:08 PM [facility] provided pt with a list of primary care resources, highlighting [facility] and a [employer] of the [place]. Patient stated that he had been to [facility] before. Patient asked about resources to help with housing, stated that he currently had a place to stay, but was living "day to day." Provided pt with contact information for [facility].

## 2014-03-16 NOTE — Progress Notes (Signed)
I have read the TTS note and agree the patient needs alcohol detox and may transfer to [facility] [address].

## 2014-03-16 NOTE — BH Assessment (Signed)
Assessment Note  [name] [name] is an 53 y.o. male who presents seeking detox from alcohol.  He reports he has had a problem with substance abuse and feels like he often drinks to help him cope with his anxiety and depression.  He last detoxed in March, but stayed in the psychiatric ED and they were unable to place him in a treatment program, which he feels would be beneficial to his ultimate success.  He reports that he can feel his body is taking a toll due to his regular alcohol abuse and he would like to get his life in order and get his anxiety and depression under control.  He reports recent job loss and financial stress and that he also had to have surgery for Carpal Tunnel.  He denies SI/HI/AVH and is calm and cooperative and motivated for treatment.  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  He is appropriate for inpatient detox.    Axis I: Depressive Disorder NOS, Generalized Anxiety Disorder and Alchol Dependence Axis II: Deferred Axis III:  Past Medical History  Diagnosis Date  . Alcohol abuse   . Sarcoidosis of lung   . Reflux   . Depression   . Anxiety   . Pancreatitis   . Carpal tunnel syndrome of right wrist    Axis IV: economic problems, occupational problems and problems with primary support group Axis V: 41-50 serious symptoms  Past Medical History:  Past Medical History  Diagnosis Date  . Alcohol abuse   . Sarcoidosis of lung   . Reflux   . Depression   . Anxiety   . Pancreatitis   . Carpal tunnel syndrome of right wrist     Past Surgical History  Procedure Laterality Date  . Knee arthroscopy    . Carpel tunnel release    . Elbow surgery      Family History:  Family History  Problem Relation Age of Onset  . Diabetes Father   . Hypertension Father   . Hypertension Paternal Grandfather   . Alcohol abuse Paternal Grandfather   . Alcohol abuse Maternal Grandfather   .  Alcohol abuse Maternal Grandmother   . Alcohol abuse Paternal Grandmother     Social History:  reports that he has been smoking Cigars.  He does not have any smokeless tobacco history on file. He reports that he drinks alcohol. He reports that he does not use illicit drugs.  Additional Social History:  Alcohol / Drug Use History of alcohol / drug use?: Yes Substance #1 Name of Substance 1: Beer 1 - Age of First Use: 16 1 - Amount (size/oz): 10 beers 1 - Frequency: daily 1 - Duration: ongoing for 2 weeks after last period of sobriety.  HEavily off and on since his late 73s 1 - Last Use / Amount: 03/16/14 0400  CIWA: CIWA-Ar BP: 137/83 mmHg Pulse Rate: 93 Tactile Disturbances: very mild itching, pins and needles, burning or numbness Tremor: three Auditory Disturbances: very mild harshness or ability to frighten Paroxysmal Sweats: no sweat visible Visual Disturbances: very mild sensitivity Anxiety: moderately anxious, or guarded, so anxiety is inferred Headache, Fullness in Head: none present Agitation: somewhat more than normal activity Orientation and Clouding of Sensorium: oriented and can do serial additions COWS:    Allergies:  Allergies  Allergen Reactions  . Oxycodone Other (See Comments)    abd pain, stomach cramps  Home Medications:  (Not in a hospital admission)  OB/GYN Status:  No LMP for male patient.  General Assessment Data Location of Assessment: [facility] ED Is this a Tele or Face-to-Face Assessment?: Face-to-Face Is this an Initial Assessment or a Re-assessment for this encounter?: Initial Assessment Living Arrangements: Alone Can pt return to current living arrangement?: Yes Admission Status: Voluntary Is patient capable of signing voluntary admission?: Yes Transfer from: [facility] Referral Source: Self/Family/Friend     [place] Living Arrangements: Alone Name of Psychiatrist: None  Name of Therapist: None   Education Status Is  patient currently in school?: No Highest grade of school patient has completed: 12  Risk to self Suicidal Ideation: No Suicidal Intent: No Is patient at risk for suicide?: No Suicidal Plan?: No Access to Means: No What has been your use of drugs/alcohol within the last 12 months?: daily alcohol use Previous Attempts/Gestures: No How many times?: 0 Intentional Self Injurious Behavior: None Family Suicide History: Yes (cousin took his own life) Recent stressful life event(s): Job Loss;Financial Problems;Recent negative physical changes (had to have carpal tunnel surgery) Persecutory voices/beliefs?: No Depression: Yes Depression Symptoms: Feeling worthless/self pity;Guilt;Loss of interest in usual pleasures;Tearfulness;Insomnia;Fatigue;Isolating Substance abuse history and/or treatment for substance abuse?: Yes Suicide prevention information given to non-admitted patients: Not applicable  Risk to Others Homicidal Ideation: No Thoughts of Harm to Others: No Current Homicidal Intent: No Current Homicidal Plan: No Access to Homicidal Means: No History of harm to others?: No Assessment of Violence: None Noted Does patient have access to weapons?: No Criminal Charges Pending?: No Does patient have a court date: No  Psychosis Hallucinations: None noted Delusions: None noted  Mental Status Report Appear/Hygiene: Other (Comment) (unremarkable) Eye Contact: Good Motor Activity: Freedom of movement Speech: Logical/coherent Level of Consciousness: Alert Mood: Depressed Affect:  (euthymic) Anxiety Level: Panic Attacks Panic attack frequency: couple of times per week Most recent panic attack: unsure Thought Processes: Coherent;Relevant Judgement: Unimpaired Orientation: Person;Place;Time;Situation Obsessive Compulsive Thoughts/Behaviors: Minimal  Cognitive Functioning Concentration: Decreased Memory: Recent Intact;Remote Intact IQ: Average Insight: Good Impulse Control:  Fair Appetite: Poor Weight Loss: 15 Weight Gain: 0 Sleep: Decreased Total Hours of Sleep:  (4) Vegetative Symptoms: Staying in bed;Decreased grooming  ADLScreening [facility] Assessment Services) Patient's cognitive ability adequate to safely complete daily activities?: Yes Patient able to express need for assistance with ADLs?: Yes Independently performs ADLs?: Yes (appropriate for developmental age)  Prior Inpatient Therapy Prior Inpatient Therapy: Yes Prior Therapy Dates: 2014,2009,2007 Prior Therapy Facilty/Provider(s): [facility], [facility], [facility]  Reason for Treatment: Detox/Rehab   Prior Outpatient Therapy Prior Outpatient Therapy: Yes Prior Therapy Dates: 2013-2014 Prior Therapy Facilty/Provider(s): [facility] Rowe Robert and Magda Paganini Reason for Treatment: Depression, Anxiety  ADL Screening (condition at time of admission) Patient's cognitive ability adequate to safely complete daily activities?: Yes Patient able to express need for assistance with ADLs?: Yes Independently performs ADLs?: Yes (appropriate for developmental age)  Home Assistive Devices/Equipment Home Assistive Devices/Equipment: None    Abuse/Neglect Assessment (Assessment to be complete while patient is alone) Physical Abuse: Denies Verbal Abuse: Denies Sexual Abuse: Denies Exploitation of patient/patient's resources: Denies Values / Beliefs Cultural Requests During Hospitalization: None Spiritual Requests During Hospitalization: None   Advance Directives (For Healthcare) Advance Directive: Patient does not have advance directive;Patient would not like information Pre-existing out of facility DNR order (yellow form or pink MOST form): No Nutrition Screen- [facility] Adult/[facility] Patient's home diet: Regular  Additional Information 1:1 In Past 12 Months?: No CIRT  Risk: No Elopement Risk: No Does patient have medical clearance?: Yes     Disposition:  Disposition Initial Assessment  Completed for this Encounter: Yes Disposition of Patient: Inpatient treatment program Type of inpatient treatment program: Adult  On Site Evaluation by:   Reviewed with Physician:    Jesus Genera Lomax 03/16/2014 5:01 PM

## 2014-03-16 NOTE — Progress Notes (Signed)
Psychoeducational Group Note  Date:  03/16/2014 Time:  8:00 p.m.   Group Topic/Focus:  Wrap-Up Group:   The focus of this group is to help patients review their daily goal of treatment and discuss progress on daily workbooks.  Participation Level: Did Not Attend  Participation Quality:  Not Applicable  Affect:  Not Applicable  Cognitive:  Not Applicable  Insight:  Not Applicable  Engagement in Group: Not Applicable  Additional Comments:  The patient didn't attend group since he had yet to be admitted to the hallway.   Gennette Pac 03/16/2014, 9:44 PM

## 2014-03-16 NOTE — Progress Notes (Signed)
[facility] provided pt with a list of primary care resources, highlighting [facility] and a [employer] of the [place]. Patient stated that he had been to [facility] before. Patient asked about resources to help with housing, stated that he currently had a place to stay, but was living "day to day." Provided pt with contact information for [facility].

## 2014-03-17 ENCOUNTER — Telehealth: Payer: Self-pay | Admitting: Family Medicine

## 2014-03-17 DIAGNOSIS — F329 Major depressive disorder, single episode, unspecified: Secondary | ICD-10-CM

## 2014-03-17 DIAGNOSIS — F10229 Alcohol dependence with intoxication, unspecified: Secondary | ICD-10-CM

## 2014-03-17 DIAGNOSIS — F3289 Other specified depressive episodes: Secondary | ICD-10-CM

## 2014-03-17 DIAGNOSIS — F411 Generalized anxiety disorder: Secondary | ICD-10-CM

## 2014-03-17 MED ORDER — CELECOXIB 100 MG PO CAPS
200.0000 mg | ORAL_CAPSULE | Freq: Two times a day (BID) | ORAL | Status: DC
  Administered 2014-03-17 – 2014-03-20 (×7): 200 mg via ORAL
  Filled 2014-03-17 (×9): qty 2

## 2014-03-17 MED ORDER — TRIAMCINOLONE ACETONIDE 0.025 % EX CREA
TOPICAL_CREAM | Freq: Two times a day (BID) | CUTANEOUS | Status: DC
  Administered 2014-03-17: 17:00:00 via TOPICAL
  Administered 2014-03-17: 1 via TOPICAL
  Administered 2014-03-19: 08:00:00 via TOPICAL
  Filled 2014-03-17: qty 15

## 2014-03-17 MED ORDER — ENSURE COMPLETE PO LIQD
237.0000 mL | Freq: Three times a day (TID) | ORAL | Status: DC
  Administered 2014-03-17 – 2014-03-20 (×8): 237 mL via ORAL

## 2014-03-17 MED ORDER — VITAMIN C 500 MG PO TABS
500.0000 mg | ORAL_TABLET | Freq: Every day | ORAL | Status: DC
  Administered 2014-03-17: 500 mg via ORAL
  Filled 2014-03-17 (×2): qty 1

## 2014-03-17 NOTE — [facility] Suicide Risk Assessment (Signed)
Suicide Risk Assessment  Admission Assessment     Nursing information obtained from:  Patient Demographic factors:  Male;Low socioeconomic status;Living alone;Unemployed Current Mental Status:  NA Loss Factors:  Financial problems / change in socioeconomic status Historical Factors:  Family history of mental illness or substance abuse Risk Reduction Factors:  Positive social support Total Time spent with patient: 45 minutes  CLINICAL FACTORS:   Depression:   Comorbid alcohol abuse/dependence Alcohol/Substance Abuse/Dependencies  Psychiatric Specialty Exam:     Blood pressure 131/88, pulse 97, temperature 97.9 F (36.6 C), temperature source Oral, resp. rate 16, height 5' 8.5" (1.74 m), weight 64.864 kg (143 lb).Body mass index is 21.42 kg/(m^2).  General Appearance: Fairly Groomed  Engineer, water::  Fair  Speech:  Clear and Coherent  Volume:  Decreased  Mood:  Anxious and Depressed  Affect:  Depressed  Thought Process:  Coherent and Goal Directed  Orientation:  Full (Time, Place, and Person)  Thought Content:  symptoms, worries, concerns  Suicidal Thoughts:  No  Homicidal Thoughts:  No  Memory:  Immediate;   Fair Recent;   Fair Remote;   Fair  Judgement:  Fair  Insight:  Present and Shallow  Psychomotor Activity:  Restlessness  Concentration:  Fair  Recall:  [employer] of Knowledge:NA  Language: Fair  Akathisia:  No  Handed:    AIMS (if indicated):     Assets:  Desire for Improvement  Sleep:  Number of Hours: 4.5   Musculoskeletal: Strength & Muscle Tone: within normal limits Gait & Station: normal Patient leans: N/A  COGNITIVE FEATURES THAT CONTRIBUTE TO RISK:  Polarized thinking Thought constriction (tunnel vision)    SUICIDE RISK:   Mild:    PLAN OF CARE: Supportive approach/coping skills/relapse prevention                               Detox                               Reassess and address the co morbidities  I certify that inpatient services  furnished can reasonably be expected to improve the patient's condition.  Nicholaus Bloom 03/17/2014, 5:14 PM

## 2014-03-17 NOTE — H&P (Signed)
Psychiatric Admission Assessment Adult  Patient Identification:  Chrishawn Boley Date of Evaluation:  03/17/2014 Chief Complaint:  Depressive Disorder NOS Alcohol Dependence Generalized anxiety disorder History of Present Illness::Patient presented to the [facility] requesting assistance with detox and anxiety and depression. He relapsed on Beer drinking 16-18 beers a day. He had been sober for 2 months but relapsed after he got layed off from work at [employer]. He denies SI/HI or AVH. He does not have DTs or Seizures with withdrawal.  He rates his anxiety as a 10/10 and his depression as a 3/10. This pleasant kind man is asking for help with his alcoholism. Elements:  Location:  adult unit. Quality:  chronic. Severity:  severe. Timing:  2 months. Duration:  years. Context:  lost his job, relapsed, severe anxiety. Associated Signs/Synptoms: Depression Symptoms:  denies (Hypo) Manic Symptoms:  denies Anxiety Symptoms:  Excessive Worry, Panic Symptoms, Social Anxiety, Psychotic Symptoms:  denies PTSD Symptoms:denies Total Time spent with patient: 30 minutes  Psychiatric Specialty Exam: Physical Exam  Constitutional: He appears well-developed and well-nourished.  Psychiatric: His speech is normal. His mood appears anxious. He is agitated. Thought content is not paranoid and not delusional. Cognition and memory are normal. He expresses impulsivity and inappropriate judgment. He exhibits a depressed mood. He expresses no homicidal and no suicidal ideation. He expresses no suicidal plans and no homicidal plans.  Patient is seen and the chart is reviewed. I agree with the findings of the exam completed in the ED.    Review of Systems  Constitutional: Negative.   Eyes: Positive for blurred vision.  Respiratory: Positive for cough and sputum production.   Cardiovascular: Negative.   Gastrointestinal: Positive for nausea and abdominal pain.  Genitourinary: Negative.   Musculoskeletal:  Positive for joint pain.  Skin: Negative.   Neurological: Positive for headaches.  Endo/Heme/Allergies: Negative.   Psychiatric/Behavioral: Positive for depression and substance abuse.    Blood pressure 132/93, pulse 103, temperature 97.9 F (36.6 C), temperature source Oral, resp. rate 16, height 5' 8.5" (1.74 m), weight 64.864 kg (143 lb).Body mass index is 21.42 kg/(m^2).  General Appearance: Fairly Groomed  Engineer, water::  Good  Speech:  Clear and Coherent  Volume:  Normal  Mood:  Anxious  Affect:  Congruent  Thought Process:  Goal Directed  Orientation:  Full (Time, Place, and Person)  Thought Content:  WDL  Suicidal Thoughts:  No  Homicidal Thoughts:  No  Memory:  NA  Judgement:  Fair  Insight:  Present  Psychomotor Activity:  Tremor  Concentration:  Fair  Recall:  [place] of Knowledge:Good  Language: Good  Akathisia:  No  Handed:  Right  AIMS (if indicated):     Assets:  Communication Skills Desire for Improvement Physical Health  Sleep:  Number of Hours: 4.5    Musculoskeletal: Strength & Muscle Tone: within normal limits Gait & Station: normal Patient leans: N/A  Past Psychiatric History: Diagnosis:  Hospitalizations:  Outpatient Care:  Substance Abuse Care:  Self-Mutilation:  Suicidal Attempts:  Violent Behaviors:   Past Medical History:   Past Medical History  Diagnosis Date  . Alcohol abuse   . Sarcoidosis of lung   . Reflux   . Depression   . Anxiety   . Pancreatitis   . Carpal tunnel syndrome of right wrist     Allergies:   Allergies  Allergen Reactions  . Oxycodone Other (See Comments)    abd pain, stomach cramps    PTA Medications: Prescriptions  prior to admission  Medication Sig Dispense Refill  . doxepin (SINEQUAN) 25 MG capsule Take 1 capsule (25 mg total) by mouth at bedtime as needed and may repeat dose one time if needed (insomnia).  60 capsule  0  . escitalopram (LEXAPRO) 10 MG tablet Take 1 tablet (10 mg total) by mouth  daily.  30 tablet  0  . folic acid (FOLVITE) 1 MG tablet Take 1 tablet (1 mg total) by mouth daily.      . hydrOXYzine (VISTARIL) 25 MG capsule Take 25 mg by mouth every 4 (four) hours as needed for anxiety.      . Multiple Vitamin (MULTIVITAMIN WITH MINERALS) TABS tablet Take 1 tablet by mouth daily. Please purchase over the counter in order to keep taking this treatment.      . pantoprazole (PROTONIX) 40 MG tablet Take 1 tablet (40 mg total) by mouth daily.  30 tablet  1  . gabapentin (NEURONTIN) 100 MG capsule Take 2 capsules (200 mg total) by mouth 3 (three) times daily.  180 capsule  0  . ondansetron (ZOFRAN) 4 MG tablet Take 4 mg by mouth every 6 (six) hours as needed for nausea or vomiting.      . thiamine 100 MG tablet Take 1 tablet (100 mg total) by mouth daily.      Marland Kitchen triamcinolone (KENALOG) 0.025 % ointment Apply 1 application topically 2 (two) times daily.      . vitamin B-12 (CYANOCOBALAMIN) 100 MCG tablet Take 1 tablet (100 mcg total) by mouth daily.      . vitamin C (ASCORBIC ACID) 250 MG tablet Take 1 tablet (250 mg total) by mouth daily.        Previous Psychotropic Medications:  Medication/Dose                 Substance Abuse History in the last 12 months:  yes  Consequences of Substance Abuse: Medical Consequences:  withdrawal symptoms  Social History:  reports that he has quit smoking. His smoking use included Cigars. He does not have any smokeless tobacco history on file. He reports that he drinks alcohol. He reports that he does not use illicit drugs. Additional Social History: Pain Medications: See home med list Prescriptions: See home med list Over the Counter: See home med list History of alcohol / drug use?: Yes Name of Substance 1: ETOH(beer) (ETOH(beer)) 1 - Age of First Use: 16 1 - Amount (size/oz): 6-10 beers 1 - Frequency: daily 1 - Duration: ongoing since relapse about a month ago 1 - Last Use / Amount: 4/20 Current Place of Residence:    Place of Birth:   Family Members: Marital Status:  Separated Children:  Sons:  Daughters: Relationships: Education:  Dentist Problems/Performance: Religious Beliefs/Practices: History of Abuse (Emotional/Phsycial/Sexual) Occupational Experiences; Military History:  None. Legal History: Hobbies/Interests:  Family History:   Family History  Problem Relation Age of Onset  . Diabetes Father   . Hypertension Father   . Hypertension Paternal Grandfather   . Alcohol abuse Paternal Grandfather   . Alcohol abuse Maternal Grandfather   . Alcohol abuse Maternal Grandmother   . Alcohol abuse Paternal Grandmother     Results for orders placed during the hospital encounter of 03/16/14 (from the past 72 hour(s))  CBC WITH DIFFERENTIAL     Status: Abnormal   Collection Time    03/16/14  8:29 AM      Result Value Ref Range   WBC 7.8  4.0 - 10.5  K/uL   RBC 4.86  4.22 - 5.81 MIL/uL   Hemoglobin 13.0  13.0 - 17.0 g/dL   HCT 38.8 (*) 39.0 - 52.0 %   MCV 79.8  78.0 - 100.0 fL   MCH 26.7  26.0 - 34.0 pg   MCHC 33.5  30.0 - 36.0 g/dL   RDW 15.9 (*) 11.5 - 15.5 %   Platelets 161  150 - 400 K/uL   Neutrophils Relative % 54  43 - 77 %   Neutro Abs 4.2  1.7 - 7.7 K/uL   Lymphocytes Relative 35  12 - 46 %   Lymphs Abs 2.7  0.7 - 4.0 K/uL   Monocytes Relative 10  3 - 12 %   Monocytes Absolute 0.8  0.1 - 1.0 K/uL   Eosinophils Relative 2  0 - 5 %   Eosinophils Absolute 0.1  0.0 - 0.7 K/uL   Basophils Relative 0  0 - 1 %   Basophils Absolute 0.0  0.0 - 0.1 K/uL  BASIC METABOLIC PANEL     Status: Abnormal   Collection Time    03/16/14  8:29 AM      Result Value Ref Range   Sodium 138  137 - 147 mEq/L   Potassium 4.0  3.7 - 5.3 mEq/L   Chloride 95 (*) 96 - 112 mEq/L   CO2 27  19 - 32 mEq/L   Glucose, Bld 121 (*) 70 - 99 mg/dL   BUN 8  6 - 23 mg/dL   Creatinine, Ser 0.98  0.50 - 1.35 mg/dL   Calcium 8.8  8.4 - 10.5 mg/dL   GFR calc non Af Amer >90  >90 mL/min   GFR calc Af  Amer >90  >90 mL/min   Comment: (NOTE)     The eGFR has been calculated using the CKD EPI equation.     This calculation has not been validated in all clinical situations.     eGFR's persistently <90 mL/min signify possible Chronic Kidney     Disease.  ETHANOL     Status: Abnormal   Collection Time    03/16/14  8:29 AM      Result Value Ref Range   Alcohol, Ethyl (B) 231 (*) 0 - 11 mg/dL   Comment:            LOWEST DETECTABLE LIMIT FOR     SERUM ALCOHOL IS 11 mg/dL     FOR MEDICAL PURPOSES ONLY  URINE RAPID DRUG SCREEN (HOSP PERFORMED)     Status: None   Collection Time    03/16/14  9:33 AM      Result Value Ref Range   Opiates NONE DETECTED  NONE DETECTED   Cocaine NONE DETECTED  NONE DETECTED   Benzodiazepines NONE DETECTED  NONE DETECTED   Amphetamines NONE DETECTED  NONE DETECTED   Tetrahydrocannabinol NONE DETECTED  NONE DETECTED   Barbiturates NONE DETECTED  NONE DETECTED   Comment:            DRUG SCREEN FOR MEDICAL PURPOSES     ONLY.  IF CONFIRMATION IS NEEDED     FOR ANY PURPOSE, NOTIFY LAB     WITHIN 5 DAYS.                LOWEST DETECTABLE LIMITS     FOR URINE DRUG SCREEN     Drug Class       Cutoff (ng/mL)     Amphetamine      1000  Barbiturate      200     Benzodiazepine   119     Tricyclics       417     Opiates          300     Cocaine          300     THC              50   Psychological Evaluations:  Assessment:   DSM5:  Schizophrenia Disorders:   Obsessive-Compulsive Disorders:   Trauma-Stressor Disorders:   Substance/Addictive Disorders:  Alcohol Intoxication (303.00) Depressive Disorders:    AXIS I:   AXIS II:   AXIS III:   Past Medical History  Diagnosis Date  . Alcohol abuse   . Sarcoidosis of lung   . Reflux   . Depression   . Anxiety   . Pancreatitis   . Carpal tunnel syndrome of right wrist    AXIS IV:   AXIS V:    Treatment Plan/Recommendations:   1. Admit for crisis management and stabilization. 2. Medication  management to reduce current symptoms to base line and improve the patient's overall level of functioning. 3. Treat health problems as indicated. 4. Develop treatment plan to decrease risk of relapse upon discharge and to reduce the need for readmission. 5. Psycho-social education regarding relapse prevention and self care. 6. Health care follow up as needed for medical problems. 7. Restart home medications where appropriate. 8. Librium protocol as written. Treatment Plan Summary: Daily contact with patient to assess and evaluate symptoms and progress in treatment Medication management Current Medications:  Current Facility-Administered Medications  Medication Dose Route Frequency Provider Last Rate Last Dose  . acetaminophen (TYLENOL) tablet 650 mg  650 mg Oral Q6H PRN Laverle Hobby, PA-C      . alum & mag hydroxide-simeth (MAALOX/MYLANTA) 200-200-20 MG/5ML suspension 30 mL  30 mL Oral Q4H PRN Laverle Hobby, PA-C      . chlordiazePOXIDE (LIBRIUM) capsule 25 mg  25 mg Oral Q6H PRN Laverle Hobby, PA-C      . chlordiazePOXIDE (LIBRIUM) capsule 25 mg  25 mg Oral QID Laverle Hobby, PA-C   25 mg at 03/17/14 4081   Followed by  . [START ON 03/18/2014] chlordiazePOXIDE (LIBRIUM) capsule 25 mg  25 mg Oral TID Laverle Hobby, PA-C       Followed by  . [START ON 03/19/2014] chlordiazePOXIDE (LIBRIUM) capsule 25 mg  25 mg Oral BH-qamhs Spencer E Simon, PA-C       Followed by  . [START ON 03/21/2014] chlordiazePOXIDE (LIBRIUM) capsule 25 mg  25 mg Oral Daily Spencer E Simon, PA-C      . doxepin (SINEQUAN) capsule 25 mg  25 mg Oral QHS PRN,MR X 1 Spencer E Simon, PA-C   25 mg at 03/16/14 2329  . escitalopram (LEXAPRO) tablet 10 mg  10 mg Oral Daily Laverle Hobby, PA-C   10 mg at 03/17/14 4481  . folic acid (FOLVITE) tablet 1 mg  1 mg Oral Daily Laverle Hobby, PA-C   1 mg at 03/17/14 8563  . hydrOXYzine (ATARAX/VISTARIL) tablet 25 mg  25 mg Oral Q6H PRN Laverle Hobby, PA-C      . loperamide  (IMODIUM) capsule 2-4 mg  2-4 mg Oral PRN Laverle Hobby, PA-C      . magnesium hydroxide (MILK OF MAGNESIA) suspension 30 mL  30 mL Oral Daily PRN Laverle Hobby, PA-C      .  multivitamin with minerals tablet 1 tablet  1 tablet Oral Daily Laverle Hobby, PA-C   1 tablet at 03/17/14 0820  . ondansetron (ZOFRAN-ODT) disintegrating tablet 4 mg  4 mg Oral Q6H PRN Laverle Hobby, PA-C   4 mg at 03/16/14 2108  . pantoprazole (PROTONIX) EC tablet 40 mg  40 mg Oral Daily Laverle Hobby, PA-C   40 mg at 03/17/14 0820  . thiamine (VITAMIN B-1) tablet 100 mg  100 mg Oral Daily Laverle Hobby, PA-C   100 mg at 03/17/14 0820  . triamcinolone (KENALOG) 0.025 % cream   Topical BID Durward Parcel, MD   1 application at 70/96/28 1006  . vitamin B-12 (CYANOCOBALAMIN) tablet 100 mcg  100 mcg Oral Daily Laverle Hobby, PA-C   100 mcg at 03/17/14 3662  . vitamin C (ASCORBIC ACID) tablet 500 mg  500 mg Oral Daily Durward Parcel, MD   500 mg at 03/17/14 9476    Observation Level/Precautions:  routine  Laboratory:  BAL 231  Psychotherapy:  groups  Medications:  Librium  Consultations:  As needed  Discharge Concerns:  Increased risk for relapse and readmission  Estimated LOS:  Other:     I certify that inpatient services furnished can reasonably be expected to improve the patient's condition.   Nena Polio 4/21/201510:29 AM Personally evaluated the patient reviewed the physical exam and labs, formulated the treatment plan, agree with the assessment Geralyn Flash A. Sabra Heck, M.D.

## 2014-03-17 NOTE — Progress Notes (Signed)
The focus of this group is to educate the patient on the purpose and policies of crisis stabilization and provide a format to answer questions about their admission.  The group details unit policies and expectations of patients while admitted.  Patient did not attend 0900 nurse education orientation group this morning.  Patient stayed in bed sleeping.    

## 2014-03-17 NOTE — Progress Notes (Signed)
NUTRITION ASSESSMENT  Pt identified as at risk on the Malnutrition Screen Tool  INTERVENTION: 1. Educated patient on the importance of nutrition and encouraged intake of food and beverages.  Sobriety and Nutrition handout provided.  Educated on a bland low fat diet as well. 2. Discussed weight goals. 3. Supplements: Ensure Complete po TID, each supplement provides 350 kcal and 13 grams of protein MVI, thiamine, vitamin J-50, folic acid daily.  NUTRITION DIAGNOSIS: Unintentional weight loss related to sub-optimal intake as evidenced by pt report.   Goal: Pt to meet >/= 90% of their estimated nutrition needs.  Monitor:  PO intake  Assessment:  Patient admitted with etoh abuse, major depression, and anxiety.  Hx includes sarcoidosis of lung, reflux, and pancreatitis.    Poor intake and appetite currently. Poor intake for the past 2 months prior to admit related to pain with eating. UBW 155 lbs per patient not confirmed by e-chart recently.    53 y.o. male  Height: Ht Readings from Last 1 Encounters:  03/16/14 5' 8.5" (1.74 m)    Weight: Wt Readings from Last 1 Encounters:  03/16/14 143 lb (64.864 kg)    Weight Hx: Wt Readings from Last 10 Encounters:  03/16/14 143 lb (64.864 kg)  02/18/14 145 lb 8.1 oz (66 kg)  01/19/14 141 lb (63.957 kg)  01/18/14 146 lb 6 oz (66.395 kg)  09/01/13 135 lb (61.236 kg)  09/01/13 140 lb (63.504 kg)  07/10/13 140 lb (63.504 kg)  07/01/12 130 lb (58.968 kg)    BMI:  Body mass index is 21.42 kg/(m^2). Pt meets criteria for normal weight based on current BMI.  Estimated Nutritional Needs: Kcal: 25-30 kcal/kg Protein: > 1 gram protein/kg Fluid: 1 ml/kcal  Diet Order: General Pt is also offered choice of unit snacks mid-morning and mid-afternoon.  Pt is eating as desired.   Lab results and medications reviewed.   Antonieta Iba, RD, LDN Clinical Inpatient Dietitian Pager:  [phone] Weekend and after hours pager:  [phone]

## 2014-03-17 NOTE — B[facility] Counselor (Signed)
Adult Psychosocial Assessment Update Interdisciplinary Team  Previous [facility] admissions/discharges:  Admissions Discharges  Date: 01/20/24 Date: 01/23/14  Date: Date:  Date: Date:  Date: Date:  Date: Date:   Changes since the last Psychosocial Assessment (including adherence to outpatient mental health and/or substance abuse treatment, situational issues contributing to decompensation and/or relapse). Patient reports admitting to hospital due to depression and relapsing on alcohol.  He cites loss of job and financial problems as stressors.             Discharge Plan 1. Will you be returning to the same living situation after discharge?   Yes: No:      If no, what is your plan?    No.  Patient is exploring other options.       2. Would you like a referral for services when you are discharged? Yes:     If yes, for what services?  No:       Yes.   Patient is requesting referral for residential treatment at [facility].       Summary and Recommendations (to be completed by the evaluator) [name] [name] is a 53 year old African American male admitted with Depressive Disorder, Generalized Anxiety and Alcohol Dependence.  He will benefit from crisis stabilization, evaluation for medication, psycho-education groups for coping skills development, group therapy and case management for discharge planning.                        Signature:  [name] [name], 03/17/2014 3:13 PM

## 2014-03-17 NOTE — Tx Team (Signed)
Initial Interdisciplinary Treatment Plan  PATIENT STRENGTHS: (choose at least two) Average or above average intelligence Capable of independent living General fund of knowledge Motivation for treatment/growth Supportive family/friends  PATIENT STRESSORS: Financial difficulties Substance abuse   PROBLEM LIST: Problem List/Patient Goals Date to be addressed Date deferred Reason deferred Estimated date of resolution  ETOH abuse- pt reports he relapsed after two weeks of being discharged from [facility] in Feb.      Pt wants to go to a drug/alcohol rehab from [facility] this time                                                 DISCHARGE CRITERIA:  Ability to meet basic life and health needs Improved stabilization in mood, thinking, and/or behavior Motivation to continue treatment in a less acute level of care Verbal commitment to aftercare and medication compliance Withdrawal symptoms are absent or subacute and managed without 24-hour nursing intervention  PRELIMINARY DISCHARGE PLAN: Attend aftercare/continuing care group Attend 12-step recovery group Outpatient therapy  PATIENT/FAMIILY INVOLVEMENT: This treatment plan has been presented to and reviewed with the patient, [name] [name], and/or family member.  The patient and family have been given the opportunity to ask questions and make suggestions.  [name] [name] 03/17/2014, 12:37 AM

## 2014-03-17 NOTE — [facility] Group Notes (Signed)
[facility] LCSW Group Therapy      Feelings About Diagnosis 1:15 - 2:30 PM         03/17/2014 3:12 PM    Type of Therapy:  Group Therapy  Participation Level :  Did not attend group.  Concha Pyo 03/17/2014  3:12 PM

## 2014-03-17 NOTE — Progress Notes (Signed)
Adult Psychoeducational Group Note  Date:  03/17/2014 Time:  11:59 PM  Group Topic/Focus:  Wrap-Up Group:   The focus of this group is to help patients review their daily goal of treatment and discuss progress on daily workbooks.  Participation Level:  Did Not Attend  Additional Comments:  Pt did not attend because he did not feel well.   Kristen A Mingia 03/17/2014, 11:59 PM

## 2014-03-17 NOTE — Progress Notes (Signed)
Vol admit, AA male requesting detox from alcohol.  Pt reports he was just here a "couple of months ago" and stayed sober for a couple of weeks, then relapsed.  Pt denies any other drug use.  Pt reports that he drinks daily 5-10 beers.  Most of the time 3.5 40's a day.  Pt presented as shaky/anxious.  Pt reports he wants to go to a rehab facility from [facility] as he feels he will need longer rehab than just detox.  Pt reports a medical hx of pancreatitis.  Pt reports he had surgery for carpal tunnel on his R wrist and also his R elbow.  Pt was appropriate and cooperative with the admission process.  Search completed.  Safety checks q15 minutes initiated.

## 2014-03-17 NOTE — Telephone Encounter (Signed)
Relevant patient education mailed to patient.  

## 2014-03-17 NOTE — Progress Notes (Signed)
Recreation Therapy Notes  nimal-Assisted Activity/Therapy (AAA/T) Program Checklist/Progress Notes Patient Eligibility Criteria Checklist & Daily Group note for Rec Tx Intervention  Date: 04.21.2015 Time: 2:45pm Location: [address] Film/video [address]    AAA/T Program Assumption of Risk Form signed by Patient/ or Parent Legal Guardian yes  Patient is free of allergies or sever asthma yes  Patient reports no fear of animals yes  Patient reports no history of cruelty to animals yes   Patient understands his/her participation is voluntary yes  Behavioral Response: Did not attend.   Laureen Ochs [place], LRT/CTRS        Lane Hacker 03/17/2014 4:28 PM

## 2014-03-17 NOTE — Progress Notes (Signed)
D:  Patient's self inventory sheet, patient has poor sleep, poor appetite, low energy level, poor attention span.  Rated depression 3, hopeless 1, anxiety 8.  Stated he has experienced chilling, cravings in past 24 hours.  Denied SI.  Has experienced lightheaded, pain in past 24 hours.  Plans to start getting better, worst pain #5.  Go to [facility] and see a doctor for anxiety, stress and muscle tension, problems with neck.  Plans to go to [facility] after discharge.  Needs financial assistance with medications after discharge. A:  Medications administered per MD orders.  Emotional support and encouragement given patient. R:  Denied SI and HI.  Denied A/V hallucinations.  Will continue to monitor patient for safety with 15 minute checks.  Safety maintained. Patient stated he is feeling much better this afternoon.

## 2014-03-18 DIAGNOSIS — F10939 Alcohol use, unspecified with withdrawal, unspecified: Secondary | ICD-10-CM

## 2014-03-18 DIAGNOSIS — F1020 Alcohol dependence, uncomplicated: Principal | ICD-10-CM

## 2014-03-18 DIAGNOSIS — F10239 Alcohol dependence with withdrawal, unspecified: Secondary | ICD-10-CM

## 2014-03-18 MED ORDER — LORATADINE 10 MG PO TABS
10.0000 mg | ORAL_TABLET | Freq: Every day | ORAL | Status: DC
  Administered 2014-03-18 – 2014-03-20 (×3): 10 mg via ORAL
  Filled 2014-03-18 (×6): qty 1

## 2014-03-18 MED ORDER — AZITHROMYCIN 250 MG PO TABS
250.0000 mg | ORAL_TABLET | Freq: Every day | ORAL | Status: DC
  Administered 2014-03-19 – 2014-03-20 (×2): 250 mg via ORAL
  Filled 2014-03-18 (×4): qty 1

## 2014-03-18 MED ORDER — AZITHROMYCIN 500 MG PO TABS
500.0000 mg | ORAL_TABLET | Freq: Every day | ORAL | Status: AC
  Administered 2014-03-18: 500 mg via ORAL
  Filled 2014-03-18: qty 2
  Filled 2014-03-18: qty 1

## 2014-03-18 MED ORDER — GUAIFENESIN 100 MG/5ML PO SOLN: 5.0000 mL | ORAL | Status: DC | PRN

## 2014-03-18 MED ORDER — FLUTICASONE PROPIONATE 50 MCG/ACT NA SUSP
2.0000 | Freq: Every day | NASAL | Status: DC
  Administered 2014-03-18 – 2014-03-20 (×3): 2 via NASAL
  Filled 2014-03-18 (×2): qty 16

## 2014-03-18 NOTE — Tx Team (Signed)
Interdisciplinary Treatment Plan Update   Date Reviewed:  03/18/2014  Time Reviewed:  9:30 AM  Progress in Treatment:   Attending groups: Yes Participating in groups: Yes Taking medication as prescribed: Yes  Tolerating medication: Yes Family/Significant other contact made:  No, patient declined collateral contact Patient understands diagnosis: Yes  Discussing patient identified problems/goals with staff: Yes Medical problems stabilized or resolved: Yes Denies suicidal/homicidal ideation: Yes Patient has not harmed self or others: Yes  For review of initial/current patient goals, please see plan of care.  Estimated Length of Stay:  3-4 days  Reasons for Continued Hospitalization:  Anxiety Depression Medication stabilization   New Problems/Goals identified:    Discharge Plan or Barriers:   Home with outpatient follow up to be determined  Additional Comments:  Attendees:  Patient:  03/18/2014 9:30 AM   Signature:  03/18/2014 9:30 AM  Signature:  Nena Polio, PA 03/18/2014 9:30 AM  Signature:   03/18/2014 9:30 AM  Signature:  Jules Husbands, RN  03/18/2014 9:30 AM  Signature:  03/18/2014 9:30 AM  Signature:  Joette Catching, LCSW 03/18/2014 9:30 AM  Signature:  Regan Lemming, LCSW 03/18/2014 9:30 AM  Signature:  Lucinda Dell, Care Coordinator [facility] 03/18/2014 9:30 AM  Signature:  03/18/2014 9:30 AM  Signature:  Marilynne Halsted, RN 03/18/2014  9:30 AM  Signature:   Lars Pinks, RN [facility] 03/18/2014  9:30 AM  Signature:  Eduard Roux, RN 03/18/2014  9:30 AM    Scribe for Treatment Team:   Joette Catching,  03/18/2014 9:30 AM

## 2014-03-18 NOTE — BHH Group Notes (Signed)
[facility] LCSW Aftercare Discharge Planning Group Note   03/18/2014 12:39 PM    Participation Quality:  Appropraite  Mood/Affect:  Appropriate  Depression Rating:  1  Anxiety Rating:  6  Thoughts of Suicide:  No  Will you contract for safety?   NA  Current AVH:  No  Plan for Discharge/Comments:  Patient attended discharge planning group and actively participated in group.  Patient advised of wanting to go to [facility] for residential treatment.  He was informed contact has been made with [facility] regarding his request. CSW provided all participants with daily workbook.   Transportation Means: Patient has transportation.   Supports:  Patient has a support system.   [name], [name]

## 2014-03-18 NOTE — Progress Notes (Signed)
D: Patient appropriate and cooperative with staff and peers. Patient presents with anxious affect and mood. He reported on the self inventory sheet that he had to request medication for sleep at night, appetite and ability to pay attention are both improving and energy level is low. Patient rated depression "1". He's visible in the milieu and attending groups. Compliant with his medications.  A: Support and encouragement provided to patient. Scheduled medications administered per MD orders. Maintain Q15 minute checks for safety.  R: Patient receptive. Denies SI/HI/AVH. Patient remains safe.

## 2014-03-18 NOTE — Progress Notes (Signed)
Pt reports he is feeling a little better today.  He is still having moderate withdrawal symptoms and is compliant with the meds on the Librium protocol.  Pt denies SI/HI/AV at this time.  Pt states he spent most of the time today in bed as he did not feel well, but he has been up this evening at intervals.  He did not go to evening group tonight.  Pt makes his needs known to staff.  He says he would like to go to a rehab for substance abuse when his detox is finished.  Support and encouragement offered.  Safety maintained with q15 minute checks.

## 2014-03-18 NOTE — Progress Notes (Signed)
[facility] MD Progress Note  03/18/2014 4:46 PM Jahan Friedlander  MRN:  [id number] Subjective:  Met with patient who notes that he is feeling better today. Slept ok, appetite is better, eating both meals so far today. Reports minimal withdrawal symptoms today. He has been up and active on the unit.  Reports his anxiety is a 5/10  Usually a little lower when things are well, but much improved since yesterday.      He also reports PND, sneezing, itchy watery eyes, but no fever, no sweats, no chills. Also reports facial pain and pressure when bending over consistent with sinus congestion.  Reports thick purulent sputum to pharnyx with foul smell and taste.  Diagnosis:   DSM5: Schizophrenia Disorders:   Obsessive-Compulsive Disorders:   Trauma-Stressor Disorders:   Substance/Addictive Disorders:  Alcohol detox Depressive Disorders:   Total Time spent with patient: 20 minutes    ADL's:  Intact  Sleep: Fair  Appetite:  Good  Suicidal Ideation:  denies Homicidal Ideation:  denies AEB (as evidenced by):  Psychiatric Specialty Exam: Physical Exam  ROS  Blood pressure 123/84, pulse 72, temperature 97.8 F (36.6 C), temperature source Oral, resp. rate 18, height 5' 8.5" (1.74 m), weight 64.864 kg (143 lb), SpO2 98.00%.Body mass index is 21.42 kg/(m^2).   General Appearance: Casual and Fairly Groomed  Engineer, water::  Good  Speech:  Clear and Coherent  Volume:  Normal  Mood:  Euthymic  Affect:  Congruent  Thought Process:  Goal Directed  Orientation:  Full (Time, Place, and Person)  Thought Content:  WDL  Suicidal Thoughts:  No  Homicidal Thoughts:  No  Memory:  NA  Judgement:  Fair  Insight:  Present  Psychomotor Activity:  Normal  Concentration:  Fair  Recall:  Good  Fund of Knowledge:Good  Language: Good  Akathisia:  No  Handed:  Right  AIMS (if indicated):     Assets:  Communication Skills Desire for Improvement  Sleep:  Number of Hours: 4.5   Musculoskeletal: Strength & Muscle  Tone: within normal limits Gait & Station: normal Patient leans: N/A  Current Medications: Current Facility-Administered Medications  Medication Dose Route Frequency Provider Last Rate Last Dose  . acetaminophen (TYLENOL) tablet 650 mg  650 mg Oral Q6H PRN Laverle Hobby, PA-C      . alum & mag hydroxide-simeth (MAALOX/MYLANTA) 200-200-20 MG/5ML suspension 30 mL  30 mL Oral Q4H PRN Laverle Hobby, PA-C      . celecoxib (CELEBREX) capsule 200 mg  200 mg Oral BID Nena Polio, PA-C   200 mg at 03/18/14 0853  . chlordiazePOXIDE (LIBRIUM) capsule 25 mg  25 mg Oral Q6H PRN Laverle Hobby, PA-C      . chlordiazePOXIDE (LIBRIUM) capsule 25 mg  25 mg Oral TID Laverle Hobby, PA-C   25 mg at 03/18/14 1154   Followed by  . [START ON 03/19/2014] chlordiazePOXIDE (LIBRIUM) capsule 25 mg  25 mg Oral BH-qamhs Spencer E Simon, PA-C       Followed by  . [START ON 03/21/2014] chlordiazePOXIDE (LIBRIUM) capsule 25 mg  25 mg Oral Daily Spencer E Simon, PA-C      . doxepin (SINEQUAN) capsule 25 mg  25 mg Oral QHS PRN,MR X 1 Spencer E Simon, PA-C   25 mg at 03/17/14 2238  . escitalopram (LEXAPRO) tablet 10 mg  10 mg Oral Daily Laverle Hobby, PA-C   10 mg at 03/18/14 7867  . feeding supplement (ENSURE COMPLETE) (ENSURE COMPLETE) liquid  237 mL  237 mL Oral TID BM Darrol Jump, RD   237 mL at 01/25/30 4388  . folic acid (FOLVITE) tablet 1 mg  1 mg Oral Daily Laverle Hobby, PA-C   1 mg at 03/18/14 8757  . hydrOXYzine (ATARAX/VISTARIL) tablet 25 mg  25 mg Oral Q6H PRN Laverle Hobby, PA-C      . loperamide (IMODIUM) capsule 2-4 mg  2-4 mg Oral PRN Laverle Hobby, PA-C   4 mg at 03/17/14 1742  . magnesium hydroxide (MILK OF MAGNESIA) suspension 30 mL  30 mL Oral Daily PRN Laverle Hobby, PA-C      . multivitamin with minerals tablet 1 tablet  1 tablet Oral Daily Laverle Hobby, PA-C   1 tablet at 03/18/14 0853  . ondansetron (ZOFRAN-ODT) disintegrating tablet 4 mg  4 mg Oral Q6H PRN Laverle Hobby, PA-C    4 mg at 03/16/14 2108  . pantoprazole (PROTONIX) EC tablet 40 mg  40 mg Oral Daily Laverle Hobby, PA-C   40 mg at 03/18/14 9728  . thiamine (VITAMIN B-1) tablet 100 mg  100 mg Oral Daily Laverle Hobby, PA-C   100 mg at 03/18/14 2060  . triamcinolone (KENALOG) 0.025 % cream   Topical BID Durward Parcel, MD      . vitamin B-12 (CYANOCOBALAMIN) tablet 100 mcg  100 mcg Oral Daily Laverle Hobby, PA-C   100 mcg at 03/18/14 1561    Lab Results: No results found for this or any previous visit (from the past 48 hour(s)).  Physical Findings: AIMS: Facial and Oral Movements Muscles of Facial Expression: None, normal Lips and Perioral Area: None, normal Jaw: None, normal Tongue: None, normal,Extremity Movements Upper (arms, wrists, hands, fingers): None, normal Lower (legs, knees, ankles, toes): None, normal, Trunk Movements Neck, shoulders, hips: None, normal, Overall Severity Severity of abnormal movements (highest score from questions above): None, normal Incapacitation due to abnormal movements: None, normal Patient's awareness of abnormal movements (rate only patient's report): No Awareness, Dental Status Current problems with teeth and/or dentures?: No Does patient usually wear dentures?: No  CIWA:  CIWA-Ar Total: 2 COWS:  COWS Total Score: 4  Treatment Plan Summary: Daily contact with patient to assess and evaluate symptoms and progress in treatment Medication management  Plan: 1. Continue crisis management and stabilization. 2. Medication management to reduce current symptoms to base line and improve the patient's overall level of functioning. 3. Treat health problems as indicated. 4. Develop treatment plan to decrease risk of relapse upon discharge and to reduce the need for readmission. 5. Psycho-social education regarding relapse prevention and self care. 6. Health care follow up as needed for medical problems. 7. Restart home medications where appropriate. 8.  Add Zithromax for sinus infection. 9. Duratuss for expectorant. 10.Zyrtec 65m for seasonal allergies. 11. Steamy shower for relief of congestion.  Medical Decision Making Problem Points:  Established problem, stable/improving (1) and New problem, with no additional work-up planned (3) Data Points:  Review of medication regiment & side effects (2)  I certify that inpatient services furnished can reasonably be expected to improve the patient's condition.   NNena Polio4/22/2015, 4:46 PM Agree with assessment and plan IGeralyn FlashA. LSabra Heck M.D.

## 2014-03-18 NOTE — BHH Group Notes (Addendum)
[place] LCSW Group Therapy  Emotional Regulation 1:15 - 2: 30 PM        03/18/2014  2:49 PM    Type of Therapy:  Group Therapy  Participation Level:  Appropriate  Participation Quality:  Appropriate  Affect:  Appropriate  Cognitive:  Attentive Appropriate  Insight:  Developing/Improving Engaged  Engagement in Therapy:  Developing/Improving Engaged  Modes of Intervention:  Discussion Exploration Problem-Solving Supportive  Summary of Progress/Problems:  Group topic was emotional regulations.  Patient participated in the discussion and was able to identify an emotion that needed to regulated.  He shared anger and frustration are the emotions he has to regulate.  Patient shared he is angry because he can not get his drug use under control.   Patient was able to identify approprite coping skills.  Concha Pyo 03/18/2014 2:49 PM

## 2014-03-18 NOTE — Progress Notes (Signed)
Pt reports he feels better this evening and has been able to be up more today.  He reports he has gone to groups and interacted with peers today.  Pt states his withdrawal symptoms are decreasing and his appetite is getting better.  Pt denies SI/HI/AV.  Pt makes his needs known to staff.  Pt wants to go to a long term rehab for his alcohol addiction when he is discharged.  Support and encouragement offered.  Safety maintained with q15 minute checks.

## 2014-03-18 NOTE — Progress Notes (Signed)
Adult Psychoeducational Group Note  Date:  03/18/2014 Time:  9:29 PM  Group Topic/Focus:  Wrap-Up Group:   The focus of this group is to help patients review their daily goal of treatment and discuss progress on daily workbooks.  Participation Level:  Active  Participation Quality:  Appropriate  Affect:  Appropriate  Cognitive:  Appropriate  Insight: Appropriate  Engagement in Group:  Engaged  Modes of Intervention:  Support  Additional Comments:  Pt stated that positive thing that happened was that energy level is coming up and that he was able to go down to all the meals today. And that he found both groups that he attended today very helpful. Pt stated that hit is all about taking things a step at a time and stopping the negative thinking and only thinking of the positive. Pt was given support from staff about his goals and taking it a small step at a time. Pt also provided support to his peer as well.  [name] [name] 03/18/2014, 9:29 PM

## 2014-03-19 NOTE — Progress Notes (Signed)
D: Patient's affect appropriate to circumstance and mood is anxious. He reported on the self inventory sheet that he's sleeping fair, improving appetite and ability to pay attention and low energy level. Patient rates hopelessness "0". He's actively participating in groups and interacting with peers in the dayroom. Patient adheres to medication regimen.  A: Support and encouragement provided to patient. Administered scheduled medications per ordering MD. Monitor Q15 minute checks for safety.  R: Patient receptive. Denies SI/HI and auditory/visual hallucinations. Patient remains safe on the unit.

## 2014-03-19 NOTE — BHH Suicide Risk Assessment (Signed)
[facility] INPATIENT:  Family/Significant Other Suicide Prevention Education  Suicide Prevention Education:  Patient Refusal for Family/Significant Other Suicide Prevention Education: The patient [name] [name] has refused to provide written consent for family/significant other to be provided Family/Significant Other Suicide Prevention Education during admission and/or prior to discharge.  Physician notified.  Quylle Hairston [name] 03/19/2014, 8:51 AM

## 2014-03-19 NOTE — Progress Notes (Signed)
Adult Psychoeducational Group Note  Date:  03/19/2014 Time:  9:00 AM  Group Topic/Focus:  Morning Wellness Group  Participation Level:  Active  Participation Quality:  Appropriate and Attentive  Affect:  Appropriate  Cognitive:  Alert and Oriented  Insight: Good  Engagement in Group:  Engaged  Modes of Intervention:  Discussion  Additional Comments: Patient verbalized that he would like to go to a treatment facility after he discharges from the hospital.  Kathlen Brunswick 03/19/2014, 12:31 PM

## 2014-03-19 NOTE — BHH Group Notes (Signed)
[facility] LCSW Group Therapy  03/19/2014   1:15 PM   Type of Therapy:  Group Therapy  Participation Level:  Active  Participation Quality:  Attentive, Sharing and Supportive  Affect:  Depressed and Flat  Cognitive:  Alert and Oriented  Insight:  Developing/Improving and Engaged  Engagement in Therapy:  Developing/Improving and Engaged  Modes of Intervention:  Activity, Clarification, Confrontation, Discussion, Education, Exploration, Limit-setting, Orientation, Problem-solving, Rapport Building, Reality Testing, Socialization and Support  Summary of Progress/Problems: Patient was attentive and engaged with speaker from [employer].  Patient was attentive to speaker while they shared their story of dealing with mental health and overcoming it.  Patient expressed interest in their programs and services and received information on their agency.  Patient processed ways they can relate to the speaker.     Chelsea Horton, LCSW 03/19/2014  2:30 PM   

## 2014-03-19 NOTE — Progress Notes (Signed)
Adult Psychoeducational Group Note  Date:  03/18/2014 Time:  10:00am Group Topic/Focus:  Personal Choices and Values:   The focus of this group is to help patients assess and explore the importance of values in their lives, how their values affect their decisions, how they express their values and what opposes their expression.  Participation Level:  Active  Participation Quality:  Appropriate and Attentive  Affect:  Appropriate  Cognitive:  Alert and Appropriate  Insight: Appropriate  Engagement in Group:  Engaged  Modes of Intervention:  Discussion and Education  Additional Comments:  Pt attended and participated in group. Discussion was on what does personal development mean to you? Pt stated developing goals and achieving those goals.   Gailen Shelter 03/19/2014, 4:13 AM

## 2014-03-19 NOTE — Progress Notes (Signed)
D: Patient in the dayroom on [facility].  Patient states he had a good day.  Patient states he is continuing to work on himself ad his illness.  Patient complains of anxiety.  Patient states, "This is the first step and I know it does not stop here I have to continue treatment when I am discharged."  Patient denies SI/HI and denies AVH. A: Staff to monitor Q 15 mins for safety.  Encouragement and support offered.  Scheduled medications administered per orders. R: Patient remains safe on the unit.  Patient attended group tonight.  Patient taking administered medications.  Patient  visible on the unit.

## 2014-03-19 NOTE — Progress Notes (Signed)
Adult Psychoeducational Group Note  Date:  03/19/2014 Time:  10:00am Group Topic/Focus:  Personal Choices and Values:   The focus of this group is to help patients assess and explore the importance of values in their lives, how their values affect their decisions, how they express their values and what opposes their expression.  Participation Level:  Active  Participation Quality:  Appropriate and Attentive  Affect:  Appropriate  Cognitive:  Alert and Appropriate  Insight: Appropriate  Engagement in Group:  Engaged  Modes of Intervention:  Discussion and Education  Additional Comments: Pt attended and participated in group. Question was asked what is your biggest stressor? Pt stated his stressor was over thinking and worrying.  Gailen Shelter 03/19/2014, 12:03 PM

## 2014-03-20 DIAGNOSIS — F329 Major depressive disorder, single episode, unspecified: Secondary | ICD-10-CM

## 2014-03-20 DIAGNOSIS — F411 Generalized anxiety disorder: Secondary | ICD-10-CM

## 2014-03-20 MED ORDER — VITAMIN C 250 MG PO TABS: 250.0000 mg | ORAL_TABLET | Freq: Every day | ORAL | Status: DC

## 2014-03-20 MED ORDER — CELECOXIB 200 MG PO CAPS: 200.0000 mg | ORAL_CAPSULE | Freq: Two times a day (BID) | ORAL | Status: DC

## 2014-03-20 MED ORDER — AZITHROMYCIN 250 MG PO TABS: ORAL_TABLET | ORAL | Status: DC

## 2014-03-20 MED ORDER — DOXEPIN HCL 25 MG PO CAPS: ORAL_CAPSULE | ORAL | Status: DC

## 2014-03-20 MED ORDER — ESCITALOPRAM OXALATE 10 MG PO TABS: 10.0000 mg | ORAL_TABLET | Freq: Every day | ORAL | Status: DC

## 2014-03-20 MED ORDER — GABAPENTIN 100 MG PO CAPS: 200.0000 mg | ORAL_CAPSULE | Freq: Three times a day (TID) | ORAL | Status: DC

## 2014-03-20 MED ORDER — VITAMIN B-12 100 MCG PO TABS: 100.0000 ug | ORAL_TABLET | Freq: Every day | ORAL | Status: DC

## 2014-03-20 MED ORDER — GABAPENTIN 100 MG PO CAPS
200.0000 mg | ORAL_CAPSULE | Freq: Three times a day (TID) | ORAL | Status: DC
  Filled 2014-03-20: qty 2

## 2014-03-20 MED ORDER — PANTOPRAZOLE SODIUM 40 MG PO TBEC: 40.0000 mg | DELAYED_RELEASE_TABLET | Freq: Every day | ORAL | Status: DC

## 2014-03-20 NOTE — Tx Team (Signed)
Interdisciplinary Treatment Plan Update   Date Reviewed:  03/20/2014  Time Reviewed:  8:40 AM  Progress in Treatment:   Attending groups: Yes Participating in groups: Yes Taking medication as prescribed: Yes  Tolerating medication: Yes Family/Significant other contact made:  No, patient declined collateral contact Patient understands diagnosis: Yes  Discussing patient identified problems/goals with staff: Yes Medical problems stabilized or resolved: Yes Denies suicidal/homicidal ideation: Yes Patient has not harmed self or others: Yes  For review of initial/current patient goals, please see plan of care.  Estimated Length of Stay:   Discharge today  Reasons for Continued Hospitalization:   New Problems/Goals identified:    Discharge Plan or Barriers:   Home with outpatient follow up with [facility]  Additional Comments:  Attendees:  Patient:  03/20/2014 8:40 AM   Signature:  Carlton Adam, MD 03/20/2014 8:40 AM  Signature:  Nena Polio, PA 03/20/2014 8:40 AM  Signature:   03/20/2014 8:40 AM  Signature:  Grayland Ormond, RN  03/20/2014 8:40 AM  Signature:  03/20/2014 8:40 AM  Signature:  Joette Catching, LCSW 03/20/2014 8:40 AM  Signature:  Regan Lemming, LCSW 03/20/2014 8:40 AM  Signature:  Lucinda Dell, Care Coordinator [facility] 03/20/2014 8:40 AM  Signature:  03/20/2014 8:40 AM  Signature:  03/20/2014  8:40 AM  Signature:   Lars Pinks, RN [facility] 03/20/2014  8:40 AM  Signature:  Eduard Roux, RN 03/20/2014  8:40 AM    Scribe for Treatment Team:   Joette Catching,  03/20/2014 8:40 AM

## 2014-03-20 NOTE — BHH Group Notes (Signed)
[facility] LCSW Aftercare Discharge Planning Group Note   03/20/2014 10:02 AM    Participation Quality:  Appropraite  Mood/Affect:  Appropriate  Depression Rating:  0  Anxiety Rating:  2  Thoughts of Suicide:  No  Will you contract for safety?   NA  Current AVH:  No  Plan for Discharge/Comments:  Patient attended discharge planning group and actively participated in group.  Patient to follow up with [facility]. CSW provided all participants with daily workbook.   Transportation Means: Patient has transportation.   Supports:  Patient has a support system.   [name], [name]

## 2014-03-20 NOTE — BHH Suicide Risk Assessment (Signed)
Suicide Risk Assessment  Discharge Assessment     Demographic Factors:  Male  Total Time spent with patient: 45 minutes  Psychiatric Specialty Exam:     Blood pressure 120/86, pulse 97, temperature 97.4 F (36.3 C), temperature source Oral, resp. rate 16, height 5' 8.5" (1.74 m), weight 64.864 kg (143 lb), SpO2 98.00%.Body mass index is 21.42 kg/(m^2).  General Appearance: Fairly Groomed  Engineer, water::  Fair  Speech:  Clear and Coherent  Volume:  Normal  Mood:  Euthymic  Affect:  Appropriate  Thought Process:  Coherent and Goal Directed  Orientation:  Full (Time, Place, and Person)  Thought Content:  relapse prevention plan  Suicidal Thoughts:  No  Homicidal Thoughts:  No  Memory:  Immediate;   Fair Recent;   Fair Remote;   Fair  Judgement:  Fair  Insight:  Present  Psychomotor Activity:  Normal  Concentration:  Fair  Recall:  [employer] of Knowledge:NA  Language: Fair  Akathisia:  No  Handed:    AIMS (if indicated):     Assets:  Desire for Improvement  Sleep:  Number of Hours: 6.25    Musculoskeletal: Strength & Muscle Tone: within normal limits Gait & Station: normal Patient leans: Right   Mental Status Per Nursing Assessment::   On Admission:  NA  Current Mental Status by Physician: IN full contact with reality. There are no active S/S of withdrawal. There are no active suicidal ideas, plans or intent. He is willing and motivated to pursue further outpatient treatment   Loss Factors: NA  Historical Factors: NA  Risk Reduction Factors:   Sense of responsibility to family  Continued Clinical Symptoms:  Depression:   Comorbid alcohol abuse/dependence Alcohol/Substance Abuse/Dependencies  Cognitive Features That Contribute To Risk:  Closed-mindedness Polarized thinking Thought constriction (tunnel vision)    Suicide Risk:  Minimal: No identifiable suicidal ideation.  Patients presenting with no risk factors but with morbid ruminations; may be  classified as minimal risk based on the severity of the depressive symptoms  Discharge Diagnoses:   AXIS I:  Alcohol Dependence, GAD, Major Depression AXIS II:  No diagnosis AXIS III:   Past Medical History  Diagnosis Date  . Alcohol abuse   . Sarcoidosis of lung   . Reflux   . Depression   . Anxiety   . Pancreatitis   . Carpal tunnel syndrome of right wrist    AXIS IV:  other psychosocial or environmental problems AXIS V:  61-70 mild symptoms  Plan Of Care/Follow-up recommendations:  Activity:  as tolerated Diet:  regular Follow up outpatient basis Is patient on multiple antipsychotic therapies at discharge:  No   Has Patient had three or more failed trials of antipsychotic monotherapy by history:  No  Recommended Plan for Multiple Antipsychotic Therapies: NA    Nicholaus Bloom 03/20/2014, 11:22 AM

## 2014-03-20 NOTE — Progress Notes (Signed)
Discharge Note: Discharge instructions/prescriptions/medication samples/bus passes given to patient. Patient verbalized understanding of discharge instructions and prescriptions. Returned belongings to patient. Denies SI/HI/AVH. Patient d/c without incident to the front lobby and transported by [employer] bus.

## 2014-03-20 NOTE — BHH Group Notes (Signed)
[facility] LCSW Aftercare Discharge Planning Group Note   03/20/2014 10:35 AM    Participation Quality:  Appropraite  Mood/Affect:  Appropriate  Depression Rating:  0  Anxiety Rating:  2  Thoughts of Suicide:  No  Will you contract for safety?   NA  Current AVH:  No  Plan for Discharge/Comments:  Patient attended discharge planning group and actively participated in group.  Patient to follow up with [facility] and [facility].  CSW provided all participants with daily workbook.   Transportation Means: Patient has transportation.   Supports:  Patient has a support system.   [name], [name]

## 2014-03-20 NOTE — Progress Notes (Signed)
[facility] Adult Case Management Discharge Plan :  Will you be returning to the same living situation after discharge: Yes,  Patient to returning to his home and follow up with [facility] on 03/23/14. At discharge, do you have transportation home?:Yes,  Patient to arrange transportation home. Do you have the ability to pay for your medications:Yes,  Patient will need assistance with medications.  Release of information consent forms completed and in the chart;  Patient's signature needed at discharge.  Patient to Follow up at: Follow-up Information   Follow up with Joycelyn Schmid -  [facility] On 03/26/2014. (Thursday, March 26, 2014 at 2:00 PM)    Contact information:   [address] [place], [place]      Follow up with [facility] On 03/23/2014. (Monday, March 23, 2014 at [facility] for an admission screening only. )    Contact information:   [address], [place]      Patient denies SI/HI:   Patient no longer endorsing SI/HI or other thoughts of self harm.\   Safety Planning and Suicide Prevention discussed:  .Reviewed with all patients during discharge planning group   [place] 03/20/2014, 8:35 AM

## 2014-03-22 NOTE — Discharge Summary (Signed)
Physician Discharge Summary Note  Patient:  [name] [name] is an 53 y.o., male MRN:  [id number] DOB:  05-25-1961 Patient phone:  [phone] (home)  Patient address:   [place] [address],  Total Time spent with patient: 30 minutes  Date of Admission:  03/16/2014 Date of Discharge: 03/20/2014  Reason for Admission:  Alcohol Detox  Discharge Diagnoses: Active Problems:   Alcohol-induced mood disorder   Psychiatric Specialty Exam:   Please See D/C SRA  Physical Exam  ROS  Blood pressure 120/86, pulse 97, temperature 97.4 F (36.3 C), temperature source Oral, resp. rate 16, height 5' 8.5" (1.74 m), weight 64.864 kg (143 lb), SpO2 98.00%.Body mass index is 21.42 kg/(m^2).  General Appearance:   Eye Contact::    Speech:    Volume:    Mood:    Affect:    Thought Process:    Orientation:    Thought Content:    Suicidal Thoughts:    Homicidal Thoughts:    Memory:    Judgement:    Insight:    Psychomotor Activity:    Concentration:    Recall:    Fund of Knowledge:  Language:   Akathisia:    Handed:    AIMS (if indicated):     Assets:    Sleep:  Number of Hours: 6.25    Past Psychiatric History:  Please see H&P Diagnosis:  Hospitalizations:  Outpatient Care:  Substance Abuse Care:  Self-Mutilation:  Suicidal Attempts:  Violent Behaviors:   Musculoskeletal: Strength & Muscle Tone:  Gait & Station:  Patient leans:   DSM5:  Schizophrenia Disorders:   Obsessive-Compulsive Disorders:   Trauma-Stressor Disorders:   Substance/Addictive Disorders:   Depressive Disorders:    Axis Diagnosis:  Discharge Diagnoses:  AXIS I: Alcohol Dependence, GAD, Major Depression  AXIS II: No diagnosis  AXIS III:  Past Medical History   Diagnosis  Date   .  Alcohol abuse    .  Sarcoidosis of lung    .  Reflux    .  Depression    .  Anxiety    .  Pancreatitis    .  Carpal tunnel syndrome of right wrist     AXIS IV: other psychosocial or environmental  problems  AXIS V: 61-70 mild symptoms  Level of Care:  OP  Hospital Course:   [name] [name] presented to the [facility] requesting assistance with detox and anxiety and depression. He relapsed on Beer drinking 16-18 beers a day. He had been sober for 2 months but relapsed after he got layed off from work at [employer].        [name] [name] admitted for alcohol detox and crisis management.  He was treated with the standard Librium protocol for detox.  Medical problems were identified and treated.  Home medications was restarted as appropriate.       Improvement was monitored by CIWA/COWS scores and patient's daily report of withdrawal symptom reduction. Emotional and mental status was monitored by daily self inventory reports completed by the patient and clinical staff.       The patient was evaluated by the treatment team for stability and plans for continued recovery upon discharge.       [name] [name] was encouraged to participate in unit programming to learn coping skills, problem solving, relaxation techniques, as well as NA/AA programs.      He was offered further treatment options upon discharge including Residential, Intensive Outpatient and Outpatient treatment.  The patient's motivation was an  integral factor for scheduling further treatment.  Employment, transportation, bed availability, health status, family support, and any pending legal issues were also considered.      Upon completion of detox the patient was both and mentally and medically stable for discharge. The patient vehemently denied SI/HI or AVH as well as any withdrawal symptoms. Consults:  None  Significant Diagnostic Studies:  None  Discharge Vitals:   Blood pressure 120/86, pulse 97, temperature 97.4 F (36.3 C), temperature source Oral, resp. rate 16, height 5' 8.5" (1.74 m), weight 64.864 kg (143 lb), SpO2 98.00%. Body mass index is 21.42 kg/(m^2). Lab Results:   No results found for this or any previous visit (from the past 72  hour(s)).  Physical Findings: AIMS: Facial and Oral Movements Muscles of Facial Expression: None, normal Lips and Perioral Area: None, normal Jaw: None, normal Tongue: None, normal,Extremity Movements Upper (arms, wrists, hands, fingers): None, normal Lower (legs, knees, ankles, toes): None, normal, Trunk Movements Neck, shoulders, hips: None, normal, Overall Severity Severity of abnormal movements (highest score from questions above): None, normal Incapacitation due to abnormal movements: None, normal Patient's awareness of abnormal movements (rate only patient's report): No Awareness, Dental Status Current problems with teeth and/or dentures?: No Does patient usually wear dentures?: No  CIWA:  CIWA-Ar Total: 1 COWS:  COWS Total Score: 4  Psychiatric Specialty Exam: See Psychiatric Specialty Exam and Suicide Risk Assessment completed by Attending Physician prior to discharge.  Discharge destination:  Home  Is patient on multiple antipsychotic therapies at discharge:  No   Has Patient had three or more failed trials of antipsychotic monotherapy by history:  No  Recommended Plan for Multiple Antipsychotic Therapies: NA  Discharge Orders   Future Orders Complete By Expires   Diet - low sodium heart healthy  As directed    Discharge instructions  As directed    Increase activity slowly  As directed        Medication List    STOP taking these medications       hydrOXYzine 25 MG capsule  Commonly known as:  VISTARIL     multivitamin with minerals Tabs tablet     ondansetron 4 MG tablet  Commonly known as:  ZOFRAN     triamcinolone 0.025 % ointment  Commonly known as:  KENALOG      TAKE these medications     Indication   azithromycin 250 MG tablet  Commonly known as:  ZITHROMAX  Please give remainder of course.   Indication:  abcessed tooth.     celecoxib 200 MG capsule  Commonly known as:  CELEBREX  Take 1 capsule (200 mg total) by mouth 2 (two) times daily.    Indication:  Joint Damage causing Pain and Loss of Function     doxepin 25 MG capsule  Commonly known as:  SINEQUAN  Take one at bedtime as needed for insomnia.   Indication:  Major Depressive Disorder, Insomnia     escitalopram 10 MG tablet  Commonly known as:  LEXAPRO  Take 1 tablet (10 mg total) by mouth daily.   Indication:  Depression     folic acid 1 MG tablet  Commonly known as:  FOLVITE  Take 1 tablet (1 mg total) by mouth daily.    folate deficiency   gabapentin 100 MG capsule  Commonly known as:  NEURONTIN  Take 2 capsules (200 mg total) by mouth 3 (three) times daily.   Indication:  Alcohol Withdrawal Syndrome, Neuropathic Pain  pantoprazole 40 MG tablet  Commonly known as:  PROTONIX  Take 1 tablet (40 mg total) by mouth daily.   Indication:  Gastroesophageal Reflux Disease     thiamine 100 MG tablet  Take 1 tablet (100 mg total) by mouth daily.    thiamin deficiency   vitamin B-12 100 MCG tablet  Commonly known as:  CYANOCOBALAMIN  Take 1 tablet (100 mcg total) by mouth daily.   Indication:  Inadequate Vitamin B12     vitamin C 250 MG tablet  Commonly known as:  ASCORBIC ACID  Take 1 tablet (250 mg total) by mouth daily.   Indication:  Vitamin Supplementation           Follow-up Information   Follow up with [place] On 03/26/2014. (Thursday, March 26, 2014 at 2:00 PM.  Please cancel appointment at [facility] if you are given a bed at [facility].)    Contact information:   [address] [place], [place]      Follow up with [facility] On 03/23/2014. (Monday, March 23, 2014 at [facility] for an admission screening only. )    Contact information:   [address], [place]      Follow-up recommendations:   Activities: Resume activity as tolerated. Diet: Heart healthy low sodium diet Tests: Follow up testing will be determined by your out patient  provider. Comments:    Total Discharge Time:  Less than 30 minutes.  Signed: Nena Polio 03/22/2014, 3:05 PM Personally evaluated the patient and agree with assessment and plan Geralyn Flash A. Sabra Heck, M.D.

## 2014-03-25 NOTE — Progress Notes (Signed)
Patient Discharge Instructions:  After Visit Summary (AVS):   Faxed to:  03/25/14 Discharge Summary Note:   Faxed to:  03/25/14 Psychiatric Admission Assessment Note:   Faxed to:  03/25/14 Suicide Risk Assessment - Discharge Assessment:   Faxed to:  03/25/14 Faxed/Sent to the Next Level Care provider:  03/25/14 Faxed to [facility] @ [phone] Faxed to [facility] @ [place], 03/25/2014, 3:31 PM

## 2014-05-04 ENCOUNTER — Encounter (HOSPITAL_COMMUNITY): Payer: Self-pay | Admitting: Emergency Medicine

## 2014-05-04 ENCOUNTER — Emergency Department (HOSPITAL_COMMUNITY)
Admission: EM | Admit: 2014-05-04 | Discharge: 2014-05-05 | Disposition: A | Discharge status: Other Acute Inpatient Hospital | Payer: No Typology Code available for payment source | Attending: Emergency Medicine | Admitting: Emergency Medicine

## 2014-05-04 DIAGNOSIS — R42 Dizziness and giddiness: Secondary | ICD-10-CM | POA: Insufficient documentation

## 2014-05-04 DIAGNOSIS — F1094 Alcohol use, unspecified with alcohol-induced mood disorder: Secondary | ICD-10-CM

## 2014-05-04 DIAGNOSIS — K7010 Alcoholic hepatitis without ascites: Secondary | ICD-10-CM

## 2014-05-04 DIAGNOSIS — F10939 Alcohol use, unspecified with withdrawal, unspecified: Secondary | ICD-10-CM | POA: Diagnosis present

## 2014-05-04 DIAGNOSIS — F10988 Alcohol use, unspecified with other alcohol-induced disorder: Secondary | ICD-10-CM | POA: Insufficient documentation

## 2014-05-04 DIAGNOSIS — F411 Generalized anxiety disorder: Secondary | ICD-10-CM | POA: Diagnosis present

## 2014-05-04 DIAGNOSIS — K219 Gastro-esophageal reflux disease without esophagitis: Secondary | ICD-10-CM | POA: Insufficient documentation

## 2014-05-04 DIAGNOSIS — J3489 Other specified disorders of nose and nasal sinuses: Secondary | ICD-10-CM | POA: Insufficient documentation

## 2014-05-04 DIAGNOSIS — M79609 Pain in unspecified limb: Secondary | ICD-10-CM | POA: Insufficient documentation

## 2014-05-04 DIAGNOSIS — Z87891 Personal history of nicotine dependence: Secondary | ICD-10-CM | POA: Insufficient documentation

## 2014-05-04 DIAGNOSIS — F3289 Other specified depressive episodes: Secondary | ICD-10-CM | POA: Insufficient documentation

## 2014-05-04 DIAGNOSIS — R0789 Other chest pain: Secondary | ICD-10-CM | POA: Insufficient documentation

## 2014-05-04 DIAGNOSIS — F329 Major depressive disorder, single episode, unspecified: Secondary | ICD-10-CM

## 2014-05-04 DIAGNOSIS — Z79899 Other long term (current) drug therapy: Secondary | ICD-10-CM | POA: Insufficient documentation

## 2014-05-04 DIAGNOSIS — R0602 Shortness of breath: Secondary | ICD-10-CM | POA: Insufficient documentation

## 2014-05-04 DIAGNOSIS — F10239 Alcohol dependence with withdrawal, unspecified: Secondary | ICD-10-CM

## 2014-05-04 DIAGNOSIS — F1010 Alcohol abuse, uncomplicated: Secondary | ICD-10-CM | POA: Diagnosis present

## 2014-05-04 DIAGNOSIS — F10229 Alcohol dependence with intoxication, unspecified: Secondary | ICD-10-CM | POA: Insufficient documentation

## 2014-05-04 DIAGNOSIS — F1020 Alcohol dependence, uncomplicated: Secondary | ICD-10-CM | POA: Diagnosis present

## 2014-05-04 LAB — URINE MICROSCOPIC-ADD ON

## 2014-05-04 LAB — URINALYSIS, ROUTINE W REFLEX MICROSCOPIC
Bilirubin Urine: NEGATIVE
Glucose, UA: NEGATIVE mg/dL
Hgb urine dipstick: NEGATIVE
Ketones, ur: NEGATIVE mg/dL
Leukocytes, UA: NEGATIVE
Nitrite: NEGATIVE
Protein, ur: 100 mg/dL — AB
Specific Gravity, Urine: 1.007 (ref 1.005–1.030)
Urobilinogen, UA: 0.2 mg/dL (ref 0.0–1.0)
pH: 5.5 (ref 5.0–8.0)

## 2014-05-04 LAB — CBC WITH DIFFERENTIAL/PLATELET
Basophils Absolute: 0.1 10*3/uL (ref 0.0–0.1)
Basophils Relative: 1 % (ref 0–1)
Eosinophils Absolute: 0 10*3/uL (ref 0.0–0.7)
Eosinophils Relative: 0 % (ref 0–5)
HCT: 41.7 % (ref 39.0–52.0)
Hemoglobin: 14.1 g/dL (ref 13.0–17.0)
Lymphocytes Relative: 26 % (ref 12–46)
Lymphs Abs: 1.5 10*3/uL (ref 0.7–4.0)
MCH: 26.9 pg (ref 26.0–34.0)
MCHC: 33.8 g/dL (ref 30.0–36.0)
MCV: 79.6 fL (ref 78.0–100.0)
Monocytes Absolute: 0.6 10*3/uL (ref 0.1–1.0)
Monocytes Relative: 11 % (ref 3–12)
Neutro Abs: 3.6 10*3/uL (ref 1.7–7.7)
Neutrophils Relative %: 62 % (ref 43–77)
Platelets: 143 10*3/uL — ABNORMAL LOW (ref 150–400)
RBC: 5.24 MIL/uL (ref 4.22–5.81)
RDW: 16.9 % — ABNORMAL HIGH (ref 11.5–15.5)
WBC: 5.8 10*3/uL (ref 4.0–10.5)

## 2014-05-04 LAB — COMPREHENSIVE METABOLIC PANEL
ALT: 168 U/L — ABNORMAL HIGH (ref 0–53)
AST: 180 U/L — ABNORMAL HIGH (ref 0–37)
Albumin: 5.1 g/dL (ref 3.5–5.2)
Alkaline Phosphatase: 44 U/L (ref 39–117)
BUN: 9 mg/dL (ref 6–23)
CO2: 22 mEq/L (ref 19–32)
Calcium: 9.7 mg/dL (ref 8.4–10.5)
Chloride: 92 mEq/L — ABNORMAL LOW (ref 96–112)
Creatinine, Ser: 0.86 mg/dL (ref 0.50–1.35)
GFR calc Af Amer: >90 mL/min (ref >90)
GFR calc non Af Amer: >90 mL/min (ref >90)
Glucose, Bld: 67 mg/dL — ABNORMAL LOW (ref 70–99)
Potassium: 4.5 mEq/L (ref 3.7–5.3)
Sodium: 138 mEq/L (ref 137–147)
Total Bilirubin: 0.6 mg/dL (ref 0.3–1.2)
Total Protein: 9.7 g/dL — ABNORMAL HIGH (ref 6.0–8.3)

## 2014-05-04 LAB — LIPASE, BLOOD: Lipase: 207 U/L — ABNORMAL HIGH (ref 11–59)

## 2014-05-04 LAB — ETHANOL: Alcohol, Ethyl (B): 259 mg/dL — ABNORMAL HIGH (ref 0–11)

## 2014-05-04 MED ORDER — LORAZEPAM 1 MG PO TABS
1.0000 mg | ORAL_TABLET | Freq: Once | ORAL | Status: AC
  Administered 2014-05-04: 1 mg via ORAL
  Filled 2014-05-04: qty 1

## 2014-05-04 MED ORDER — THIAMINE HCL 100 MG/ML IJ SOLN: 100.0000 mg | Freq: Every day | INTRAMUSCULAR | Status: DC

## 2014-05-04 MED ORDER — LORAZEPAM 1 MG PO TABS
0.0000 mg | ORAL_TABLET | Freq: Four times a day (QID) | ORAL | Status: DC
  Administered 2014-05-04: 2 mg via ORAL
  Administered 2014-05-05: 1 mg via ORAL
  Administered 2014-05-05: 2 mg via ORAL
  Administered 2014-05-05: 1 mg via ORAL
  Filled 2014-05-04: qty 2
  Filled 2014-05-04: qty 1
  Filled 2014-05-04: qty 2
  Filled 2014-05-04: qty 1

## 2014-05-04 MED ORDER — THIAMINE HCL 100 MG/ML IJ SOLN
Freq: Once | INTRAVENOUS | Status: AC
  Administered 2014-05-04: 19:00:00 via INTRAVENOUS
  Filled 2014-05-04: qty 1000

## 2014-05-04 MED ORDER — ALUM & MAG HYDROXIDE-SIMETH 200-200-20 MG/5ML PO SUSP: 30.0000 mL | ORAL | Status: DC | PRN

## 2014-05-04 MED ORDER — IBUPROFEN 200 MG PO TABS
600.0000 mg | ORAL_TABLET | Freq: Three times a day (TID) | ORAL | Status: DC | PRN
  Administered 2014-05-04: 600 mg via ORAL
  Filled 2014-05-04: qty 3

## 2014-05-04 MED ORDER — ONDANSETRON HCL 4 MG PO TABS: 4.0000 mg | ORAL_TABLET | Freq: Three times a day (TID) | ORAL | Status: DC | PRN

## 2014-05-04 MED ORDER — LORAZEPAM 1 MG PO TABS: 0.0000 mg | ORAL_TABLET | Freq: Two times a day (BID) | ORAL | Status: DC

## 2014-05-04 MED ORDER — VITAMIN B-1 100 MG PO TABS
100.0000 mg | ORAL_TABLET | Freq: Every day | ORAL | Status: DC
  Administered 2014-05-04: 100 mg via ORAL
  Filled 2014-05-04: qty 1

## 2014-05-04 MED ORDER — NICOTINE 21 MG/24HR TD PT24: 21.0000 mg | MEDICATED_PATCH | Freq: Every day | TRANSDERMAL | Status: DC

## 2014-05-04 NOTE — ED Provider Notes (Signed)
CSN: [id number]     Arrival date & time 05/04/14  1522 History  This chart was scribed for non-physician practitioner, Carlisle Cater, PA-C, working with Ephraim Hamburger, MD by Roe Coombs, ED Scribe. This patient was seen in room WTR5/WTR5 and the patient's care was started at 5:09 PM.   Chief Complaint  Patient presents with  . Medical Clearance  . Alcohol Problem  . Leg Pain      The history is provided by the patient. No language interpreter was used.    HPI Comments: [name] [name] is a 53 y.o. male with a history of alcohol abuse and pancreatitis who presents to the Emergency Department requesting help with detox from alcohol. Patient admits that he has been drinking alcohol heavily for the past several weeks. He states that his last drink was yesterday. He has been experiencing lightheadedness, abdominal pain, decreased appetite and chills. He has not eaten today at all. Patient also admits that he has had worsening depression and anxiety recently. He denies suicidal or homicidal ideation.  Patient is also complaining of generalized body aches especially in both legs onset 2 days ago. Patient also states that he has sores to the soles of his feet. He describes pain in his legs as a pins and needles sensation. He further reports a history of sarcoidosis of the lung - he has intermittent chest pain and shortness of breath with this. Other symptoms also include congestion and rhinorrhea. He denies fever, cough, abdominal pain, vomiting, or diarrhea.   Past Medical History  Diagnosis Date  . Alcohol abuse   . Sarcoidosis of lung   . Reflux   . Depression   . Anxiety   . Pancreatitis   . Carpal tunnel syndrome of right wrist    Past Surgical History  Procedure Laterality Date  . Knee arthroscopy    . Carpel tunnel release    . Elbow surgery     Family History  Problem Relation Age of Onset  . Diabetes Father   . Hypertension Father   . Hypertension Paternal Grandfather   .  Alcohol abuse Paternal Grandfather   . Alcohol abuse Maternal Grandfather   . Alcohol abuse Maternal Grandmother   . Alcohol abuse Paternal Grandmother    History  Substance Use Topics  . Smoking status: Former Smoker    Types: Cigars  . Smokeless tobacco: Not on file  . Alcohol Use: 0.0 oz/week     Comment: 3 1/2 40oz beers/daily last use today    Review of Systems  Constitutional: Negative for fever and appetite change (decreased).  HENT: Positive for congestion and rhinorrhea. Negative for sore throat.   Eyes: Negative for redness.  Respiratory: Positive for shortness of breath. Negative for cough.   Cardiovascular: Positive for chest pain.  Gastrointestinal: Positive for nausea. Negative for vomiting, abdominal pain and diarrhea.  Genitourinary: Negative for dysuria.  Musculoskeletal: Positive for myalgias (bilateral leg pain). Negative for back pain, gait problem and neck pain.  Skin: Negative for rash.  Neurological: Positive for light-headedness. Negative for headaches.  Psychiatric/Behavioral: Positive for dysphoric mood. The patient is nervous/anxious.     Allergies  Oxycodone  Home Medications   Prior to Admission medications   Medication Sig Start Date End Date Taking? Authorizing Provider  azithromycin (ZITHROMAX) 250 MG tablet Please give remainder of course. 03/20/14   Nena Polio, PA-C  celecoxib (CELEBREX) 200 MG capsule Take 1 capsule (200 mg total) by mouth 2 (two) times daily. 03/20/14  Nena Polio, PA-C  doxepin (SINEQUAN) 25 MG capsule Take one at bedtime as needed for insomnia. 03/20/14   Nena Polio, PA-C  escitalopram (LEXAPRO) 10 MG tablet Take 1 tablet (10 mg total) by mouth daily. 03/20/14   Nena Polio, PA-C  folic acid (FOLVITE) 1 MG tablet Take 1 tablet (1 mg total) by mouth daily. 02/20/14   Erline Hau, MD  gabapentin (NEURONTIN) 100 MG capsule Take 2 capsules (200 mg total) by mouth 3 (three) times daily. 03/20/14   Nena Polio, PA-C  pantoprazole (PROTONIX) 40 MG tablet Take 1 tablet (40 mg total) by mouth daily. 03/20/14   Nena Polio, PA-C  thiamine 100 MG tablet Take 1 tablet (100 mg total) by mouth daily. 02/20/14   Erline Hau, MD  vitamin B-12 (CYANOCOBALAMIN) 100 MCG tablet Take 1 tablet (100 mcg total) by mouth daily. 03/20/14   Nena Polio, PA-C  vitamin C (ASCORBIC ACID) 250 MG tablet Take 1 tablet (250 mg total) by mouth daily. 03/20/14   Nena Polio, PA-C   Physical Exam  Nursing note and vitals reviewed. Constitutional: He appears well-developed and well-nourished. No distress.  +odor of alcohol   HENT:  Head: Normocephalic and atraumatic.  Eyes: Conjunctivae and EOM are normal. Right eye exhibits no discharge. Left eye exhibits no discharge.  Neck: Normal range of motion. Neck supple. No tracheal deviation present.  Cardiovascular: Normal rate, regular rhythm and normal heart sounds.   Pulmonary/Chest: Effort normal and breath sounds normal. No respiratory distress.  Abdominal: Soft. There is no tenderness.  Musculoskeletal: Normal range of motion.  Neurological: He is alert.  Skin: Skin is warm and dry.  Psychiatric: His speech is normal and behavior is normal. He exhibits a depressed mood. He expresses no homicidal and no suicidal ideation.    ED Course  Procedures (including critical care time) DIAGNOSTIC STUDIES: Oxygen Saturation is 94% on room air, adequate by my interpretation.    COORDINATION OF CARE: 5:30 PM- Patient informed of current plan for treatment and evaluation and agrees with plan at this time.   BP 118/80  Pulse 108  Temp(Src) 98.2 F (36.8 C) (Oral)  Resp 16  SpO2 94%  Labs Review Labs Reviewed  CBC WITH DIFFERENTIAL - Abnormal; Notable for the following:    RDW 16.9 (*)    Platelets 143 (*)    All other components within normal limits  COMPREHENSIVE METABOLIC PANEL - Abnormal; Notable for the following:    Chloride 92 (*)    Glucose,  Bld 67 (*)    Total Protein 9.7 (*)    AST 180 (*)    ALT 168 (*)    All other components within normal limits  LIPASE, BLOOD - Abnormal; Notable for the following:    Lipase 207 (*)    All other components within normal limits  URINALYSIS, ROUTINE W REFLEX MICROSCOPIC - Abnormal; Notable for the following:    Protein, ur 100 (*)    All other components within normal limits  ETHANOL - Abnormal; Notable for the following:    Alcohol, Ethyl (B) 259 (*)    All other components within normal limits  URINE MICROSCOPIC-ADD ON - Abnormal; Notable for the following:    Casts GRANULAR CAST (*)    All other components within normal limits   5:54 PM Spoke with Reita Cliche NP who requests patient receive IV fluids. I have ordered this.   8:09 PM Labs reviewed.   Transaminases are at baseline.  Mild hypoglycemia, no change in mentation, likely due to starvation/heavy alcohol intake.   Patient has persistent elevation in lipase. No active severe abd pain or vomiting. This is likely chronic and based on exam, do not feel this warrants hospitalization at this point.   Fluids given, will help low chloride. No elevated anion gap.   MDM   Final diagnoses:  Alcohol abuse  Alcohol dependence  Alcohol withdrawal  Alcohol-induced mood disorder  Alcoholic hepatitis  Generalized anxiety disorder   Pending TTS eval. Patient is cleared as above.   I personally performed the services described in this documentation, which was scribed in my presence. The recorded information has been reviewed and is accurate.    Carlisle Cater, PA-C 05/04/14 2011

## 2014-05-04 NOTE — ED Notes (Signed)
Patient admits that he is an alcoholic and he recently relapsed. He has been drinking daily for a month and not taking his prescribed medications. (neurotin, lexapro, protonix, and doxepin. He has not been eating or sleeping and complains of generalized weakness.

## 2014-05-04 NOTE — ED Notes (Signed)
Per EMS: pt c/o bilateral leg pain x 2 days and  numbness in feet. Pt also c/o severe anxiety.

## 2014-05-04 NOTE — Consult Note (Signed)
Case discussed, patient needs inpatient psychiatric care 

## 2014-05-04 NOTE — Consult Note (Signed)
[employer] Face-to-Face Psychiatry Consult   Reason for Consult:  Alcohol dependence/detox Referring Physician:  EDP  [name] [name] is an 53 y.o. male. Total Time spent with patient: 20 minutes  Assessment: AXIS I:  Alcohol Abuse and Major Depression, single episode AXIS II:  Deferred AXIS III:   Past Medical History  Diagnosis Date  . Alcohol abuse   . Sarcoidosis of lung   . Reflux   . Depression   . Anxiety   . Pancreatitis   . Carpal tunnel syndrome of right wrist    AXIS IV:  other psychosocial or environmental problems, problems related to social environment and problems with primary support group AXIS V:  21-30 behavior considerably influenced by delusions or hallucinations OR serious impairment in judgment, communication OR inability to function in almost all areas  Plan:  Recommend psychiatric Inpatient admission when medically cleared.  Subjective:   [name] [name] is a 53 y.o. male patient admitted with alcohol detox/dependence once he is medically uncompromised.  HPI:  Patient has been drinking at least a six pack daily for the past month and a half.  He started feeling physically bad and stopped eating, history of pancreatitis.  Visibly in pain on assessment, liver enzymes in the past have been extremely elevated.Marland Kitchenawaiting lab result.  Medically compromised at this point but once he clears, alcohol detox will be needed.  Positive for depression but denies suicidal/homicidal ideations and hallucinations.  His longest period of sobriety was five months when he was working and busy.  He would like to go to detox and then [facility] to help maintain his sobriety. HPI Elements:   Location:  generalized. Quality:  acute. Severity:  severe. Timing:  constant. Duration:  one and a half months. Context:  stressors.  Past Psychiatric History: Past Medical History  Diagnosis Date  . Alcohol abuse   . Sarcoidosis of lung   . Reflux   . Depression   . Anxiety   . Pancreatitis   . Carpal  tunnel syndrome of right wrist     reports that he has quit smoking. His smoking use included Cigars. He does not have any smokeless tobacco history on file. He reports that he drinks alcohol. He reports that he does not use illicit drugs. Family History  Problem Relation Age of Onset  . Diabetes Father   . Hypertension Father   . Hypertension Paternal Grandfather   . Alcohol abuse Paternal Grandfather   . Alcohol abuse Maternal Grandfather   . Alcohol abuse Maternal Grandmother   . Alcohol abuse Paternal Grandmother            Allergies:   Allergies  Allergen Reactions  . Oxycodone Other (See Comments)    abd pain, stomach cramps     ACT Assessment Complete:  Yes:    Educational Status    Risk to Self: Risk to self Is patient at risk for suicide?: No Substance abuse history and/or treatment for substance abuse?: Yes  Risk to Others:    Abuse:    Prior Inpatient Therapy:    Prior Outpatient Therapy:    Additional Information:                    Objective: Blood pressure 118/80, pulse 108, temperature 98.2 F (36.8 C), temperature source Oral, resp. rate 16, SpO2 94.00%.There is no weight on file to calculate BMI. Results for orders placed during the hospital encounter of 05/04/14 (from the past 72 hour(s))  CBC WITH DIFFERENTIAL  Status: Abnormal   Collection Time    05/04/14  5:25 PM      Result Value Ref Range   WBC 5.8  4.0 - 10.5 K/uL   RBC 5.24  4.22 - 5.81 MIL/uL   Hemoglobin 14.1  13.0 - 17.0 g/dL   HCT 41.7  39.0 - 52.0 %   MCV 79.6  78.0 - 100.0 fL   MCH 26.9  26.0 - 34.0 pg   MCHC 33.8  30.0 - 36.0 g/dL   RDW 16.9 (*) 11.5 - 15.5 %   Platelets 143 (*) 150 - 400 K/uL   Neutrophils Relative % 62  43 - 77 %   Neutro Abs 3.6  1.7 - 7.7 K/uL   Lymphocytes Relative 26  12 - 46 %   Lymphs Abs 1.5  0.7 - 4.0 K/uL   Monocytes Relative 11  3 - 12 %   Monocytes Absolute 0.6  0.1 - 1.0 K/uL   Eosinophils Relative 0  0 - 5 %   Eosinophils  Absolute 0.0  0.0 - 0.7 K/uL   Basophils Relative 1  0 - 1 %   Basophils Absolute 0.1  0.0 - 0.1 K/uL   Labs are reviewed and are pertinent for medical issues being addressed.  Current Facility-Administered Medications  Medication Dose Route Frequency Provider Last Rate Last Dose  . alum & mag hydroxide-simeth (MAALOX/MYLANTA) 200-200-20 MG/5ML suspension 30 mL  30 mL Oral PRN Carlisle Cater, PA-C      . ibuprofen (ADVIL,MOTRIN) tablet 600 mg  600 mg Oral Q8H PRN Carlisle Cater, PA-C      . LORazepam (ATIVAN) tablet 0-4 mg  0-4 mg Oral 4 times per day Carlisle Cater, PA-C       Followed by  . [START ON 05/06/2014] LORazepam (ATIVAN) tablet 0-4 mg  0-4 mg Oral Q12H Carlisle Cater, PA-C      . LORazepam (ATIVAN) tablet 1 mg  1 mg Oral Once Carlisle Cater, PA-C      . nicotine (NICODERM CQ - dosed in mg/24 hours) patch 21 mg  21 mg Transdermal Daily Carlisle Cater, PA-C      . ondansetron [facility]) tablet 4 mg  4 mg Oral Q8H PRN Carlisle Cater, PA-C      . sodium chloride 0.9 % 1,000 mL with thiamine 427 mg, folic acid 1 mg, multivitamins adult 10 mL infusion   Intravenous Once Carlisle Cater, PA-C      . thiamine (VITAMIN B-1) tablet 100 mg  100 mg Oral Daily Carlisle Cater, PA-C       Current Outpatient Prescriptions  Medication Sig Dispense Refill  . azithromycin (ZITHROMAX) 250 MG tablet Please give remainder of course.  6 each  0  . celecoxib (CELEBREX) 200 MG capsule Take 1 capsule (200 mg total) by mouth 2 (two) times daily.  60 capsule  0  . doxepin (SINEQUAN) 25 MG capsule Take one at bedtime as needed for insomnia.  30 capsule  0  . escitalopram (LEXAPRO) 10 MG tablet Take 1 tablet (10 mg total) by mouth daily.  30 tablet  0  . folic acid (FOLVITE) 1 MG tablet Take 1 tablet (1 mg total) by mouth daily.      Marland Kitchen gabapentin (NEURONTIN) 100 MG capsule Take 2 capsules (200 mg total) by mouth 3 (three) times daily.  180 capsule  0  . pantoprazole (PROTONIX) 40 MG tablet Take 1 tablet (40 mg total) by  mouth daily.  30 tablet  0  .  thiamine 100 MG tablet Take 1 tablet (100 mg total) by mouth daily.      . vitamin B-12 (CYANOCOBALAMIN) 100 MCG tablet Take 1 tablet (100 mcg total) by mouth daily.      . vitamin C (ASCORBIC ACID) 250 MG tablet Take 1 tablet (250 mg total) by mouth daily.        Psychiatric Specialty Exam:     Blood pressure 118/80, pulse 108, temperature 98.2 F (36.8 C), temperature source Oral, resp. rate 16, SpO2 94.00%.There is no weight on file to calculate BMI.  General Appearance: Casual  Eye Contact::  Good  Speech:  Normal Rate  Volume:  Normal  Mood:  Depressed  Affect:  Congruent  Thought Process:  Coherent  Orientation:  Full (Time, Place, and Person)  Thought Content:  Rumination  Suicidal Thoughts:  No  Homicidal Thoughts:  No  Memory:  Immediate;   Fair Recent;   Fair Remote;   Fair  Judgement:  Poor  Insight:  Fair  Psychomotor Activity:  Decreased  Concentration:  Fair  Recall:  [employer] of Knowledge:Fair  Language: Fair  Akathisia:  No  Handed:  Right  AIMS (if indicated):     Assets:  Desire for Improvement Housing Leisure Time Resilience Social Support  Sleep:      Musculoskeletal: Strength & Muscle Tone: within normal limits Gait & Station: normal Patient leans: N/A  Treatment Plan Summary: Daily contact with patient to assess and evaluate symptoms and progress in treatment Medication management, inpatient hospitalization once medical clearance, ativan CIWA protocol in place.  Waylan Boga, PMH-NP 05/04/2014 6:02 PM

## 2014-05-04 NOTE — ED Notes (Signed)
Bed: [facility] Expected date:  Expected time:  Means of arrival:  Comments: 

## 2014-05-05 ENCOUNTER — Observation Stay (HOSPITAL_COMMUNITY)
Admission: AD | Admit: 2014-05-05 | Discharge: 2014-05-06 | Disposition: A | Discharge status: Home / Self Care | Payer: Self-pay | Source: Intra-hospital | Attending: Psychiatry | Admitting: Psychiatry

## 2014-05-05 ENCOUNTER — Encounter (HOSPITAL_COMMUNITY): Payer: Self-pay | Admitting: Emergency Medicine

## 2014-05-05 DIAGNOSIS — F32A Depression, unspecified: Secondary | ICD-10-CM

## 2014-05-05 DIAGNOSIS — F329 Major depressive disorder, single episode, unspecified: Secondary | ICD-10-CM

## 2014-05-05 DIAGNOSIS — F1994 Other psychoactive substance use, unspecified with psychoactive substance-induced mood disorder: Secondary | ICD-10-CM | POA: Insufficient documentation

## 2014-05-05 DIAGNOSIS — F10939 Alcohol use, unspecified with withdrawal, unspecified: Secondary | ICD-10-CM

## 2014-05-05 DIAGNOSIS — F1094 Alcohol use, unspecified with alcohol-induced mood disorder: Secondary | ICD-10-CM

## 2014-05-05 DIAGNOSIS — F1010 Alcohol abuse, uncomplicated: Principal | ICD-10-CM | POA: Insufficient documentation

## 2014-05-05 DIAGNOSIS — F1020 Alcohol dependence, uncomplicated: Secondary | ICD-10-CM

## 2014-05-05 DIAGNOSIS — K7010 Alcoholic hepatitis without ascites: Secondary | ICD-10-CM

## 2014-05-05 DIAGNOSIS — F411 Generalized anxiety disorder: Secondary | ICD-10-CM | POA: Insufficient documentation

## 2014-05-05 DIAGNOSIS — F10239 Alcohol dependence with withdrawal, unspecified: Secondary | ICD-10-CM

## 2014-05-05 DIAGNOSIS — Z87891 Personal history of nicotine dependence: Secondary | ICD-10-CM | POA: Insufficient documentation

## 2014-05-05 DIAGNOSIS — D869 Sarcoidosis, unspecified: Secondary | ICD-10-CM | POA: Insufficient documentation

## 2014-05-05 DIAGNOSIS — K219 Gastro-esophageal reflux disease without esophagitis: Secondary | ICD-10-CM | POA: Insufficient documentation

## 2014-05-05 DIAGNOSIS — F332 Major depressive disorder, recurrent severe without psychotic features: Secondary | ICD-10-CM | POA: Insufficient documentation

## 2014-05-05 LAB — RAPID URINE DRUG SCREEN, HOSP PERFORMED
Amphetamines: NOT DETECTED
Barbiturates: NOT DETECTED
Benzodiazepines: NOT DETECTED
Cocaine: NOT DETECTED
Opiates: NOT DETECTED
Tetrahydrocannabinol: NOT DETECTED

## 2014-05-05 MED ORDER — IBUPROFEN 600 MG PO TABS: 600.0000 mg | ORAL_TABLET | Freq: Three times a day (TID) | ORAL | Status: DC | PRN

## 2014-05-05 MED ORDER — LORAZEPAM 1 MG PO TABS
1.0000 mg | ORAL_TABLET | Freq: Once | ORAL | Status: AC
  Administered 2014-05-05: 1 mg via ORAL
  Filled 2014-05-05: qty 1

## 2014-05-05 MED ORDER — NICOTINE 21 MG/24HR TD PT24
21.0000 mg | MEDICATED_PATCH | Freq: Every day | TRANSDERMAL | Status: DC
  Filled 2014-05-05 (×3): qty 1

## 2014-05-05 MED ORDER — TRAZODONE HCL 50 MG PO TABS
50.0000 mg | ORAL_TABLET | Freq: Every evening | ORAL | Status: DC | PRN
  Filled 2014-05-05 (×2): qty 1

## 2014-05-05 MED ORDER — LORAZEPAM 1 MG PO TABS: 1.0000 mg | ORAL_TABLET | Freq: Two times a day (BID) | ORAL | Status: DC

## 2014-05-05 MED ORDER — ADULT MULTIVITAMIN W/MINERALS CH
1.0000 | ORAL_TABLET | Freq: Every day | ORAL | Status: DC
  Administered 2014-05-05 – 2014-05-06 (×2): 1 via ORAL
  Filled 2014-05-05 (×5): qty 1

## 2014-05-05 MED ORDER — LORAZEPAM 1 MG PO TABS
1.0000 mg | ORAL_TABLET | Freq: Three times a day (TID) | ORAL | Status: DC
  Administered 2014-05-06 (×2): 1 mg via ORAL
  Filled 2014-05-05: qty 1

## 2014-05-05 MED ORDER — ALUM & MAG HYDROXIDE-SIMETH 200-200-20 MG/5ML PO SUSP: 30.0000 mL | ORAL | Status: DC | PRN

## 2014-05-05 MED ORDER — LORAZEPAM 1 MG PO TABS
1.0000 mg | ORAL_TABLET | Freq: Four times a day (QID) | ORAL | Status: DC | PRN
  Filled 2014-05-05: qty 1

## 2014-05-05 MED ORDER — LORAZEPAM 1 MG PO TABS
1.0000 mg | ORAL_TABLET | Freq: Four times a day (QID) | ORAL | Status: AC
  Administered 2014-05-05 (×3): 1 mg via ORAL
  Filled 2014-05-05 (×3): qty 1

## 2014-05-05 MED ORDER — ESCITALOPRAM OXALATE 10 MG PO TABS
10.0000 mg | ORAL_TABLET | Freq: Every day | ORAL | Status: DC
  Administered 2014-05-05 – 2014-05-06 (×2): 10 mg via ORAL
  Filled 2014-05-05 (×5): qty 1

## 2014-05-05 MED ORDER — ACETAMINOPHEN 325 MG PO TABS: 650.0000 mg | ORAL_TABLET | Freq: Four times a day (QID) | ORAL | Status: DC | PRN

## 2014-05-05 MED ORDER — LORAZEPAM 1 MG PO TABS: 0.0000 mg | ORAL_TABLET | Freq: Two times a day (BID) | ORAL | Status: DC

## 2014-05-05 MED ORDER — ONDANSETRON 4 MG PO TBDP
4.0000 mg | ORAL_TABLET | Freq: Four times a day (QID) | ORAL | Status: DC | PRN
  Administered 2014-05-05: 4 mg via ORAL
  Filled 2014-05-05: qty 1

## 2014-05-05 MED ORDER — VITAMIN B-1 100 MG PO TABS
100.0000 mg | ORAL_TABLET | Freq: Every day | ORAL | Status: DC
  Administered 2014-05-06: 100 mg via ORAL
  Filled 2014-05-05 (×3): qty 1

## 2014-05-05 MED ORDER — VITAMIN B-1 100 MG PO TABS: 100.0000 mg | ORAL_TABLET | Freq: Every day | ORAL | Status: DC

## 2014-05-05 MED ORDER — ONDANSETRON HCL 4 MG PO TABS: 4.0000 mg | ORAL_TABLET | Freq: Three times a day (TID) | ORAL | Status: DC | PRN

## 2014-05-05 MED ORDER — GABAPENTIN 100 MG PO CAPS
200.0000 mg | ORAL_CAPSULE | Freq: Two times a day (BID) | ORAL | Status: DC
  Administered 2014-05-05 – 2014-05-06 (×2): 200 mg via ORAL
  Filled 2014-05-05 (×6): qty 2

## 2014-05-05 MED ORDER — LORAZEPAM 1 MG PO TABS: 0.0000 mg | ORAL_TABLET | Freq: Four times a day (QID) | ORAL | Status: DC

## 2014-05-05 MED ORDER — LORAZEPAM 1 MG PO TABS: 1.0000 mg | ORAL_TABLET | Freq: Once | ORAL | Status: DC

## 2014-05-05 MED ORDER — LOPERAMIDE HCL 2 MG PO CAPS: 2.0000 mg | ORAL_CAPSULE | ORAL | Status: DC | PRN

## 2014-05-05 MED ORDER — HYDROXYZINE HCL 25 MG PO TABS
25.0000 mg | ORAL_TABLET | Freq: Four times a day (QID) | ORAL | Status: DC | PRN
  Administered 2014-05-05: 25 mg via ORAL
  Filled 2014-05-05: qty 1

## 2014-05-05 MED ORDER — MAGNESIUM HYDROXIDE 400 MG/5ML PO SUSP: 30.0000 mL | Freq: Every day | ORAL | Status: DC | PRN

## 2014-05-05 NOTE — ED Provider Notes (Signed)
Medical screening examination/treatment/procedure(s) were performed by non-physician practitioner and as supervising physician I was immediately available for consultation/collaboration.   EKG Interpretation None        Ephraim Hamburger, MD 05/05/14 [phone]

## 2014-05-05 NOTE — Progress Notes (Signed)
Pt was given AA meeting schedule and community mental health resource packet for psycho-educational materials.

## 2014-05-05 NOTE — Progress Notes (Signed)
Patient ID: [name] [name], male   DOB: 1961/08/19, 53 y.o.   MRN: [id number] Pt alert and oriented. Denies SI/HI, -A/[place] verbally contracts for safety. Will continue to monitor closely for evaluation and stabilization.

## 2014-05-05 NOTE — Progress Notes (Signed)
Transfer from [facility]. Arrives voluntarily and in no acute distress for the treatment of ETOH abuse and anxiety. Appears flat, is calm and cooperative with assessment. States he drinks daily. Currently denies SI/HI/AVH and contracts for safety. Q15 minute observation initiated on arrival. POC and expectations reviewed and pt verbalized understanding. Will continue to monitor q15 minutes for safety and current POC pending discharge disposition.

## 2014-05-05 NOTE — ED Notes (Signed)
Pt transferred from main ed, presents for alcohol detox, pt reports he drinks a 6 pack of beer daily.  Denies drug use, denies SI, HI or AV hallucinations.  Feeling hopeless, pt reports diagnosed with severe anxiety.  Complaint of joint pain, rates a 10 on pain scale.  Pt cooperative but anxious at present.

## 2014-05-05 NOTE — Consult Note (Signed)
  Psychiatric Specialty Exam: Physical Exam  ROS  Blood pressure 138/88, pulse 115, temperature 97.9 F (36.6 C), temperature source Oral, resp. rate 20, SpO2 97.00%.There is no weight on file to calculate BMI.  General Appearance: Casual  Eye Contact::  Good  Speech:  Clear and Coherent  Volume:  Normal  Mood:  Anxious  Affect:  Appropriate  Thought Process:  Coherent and Logical  Orientation:  Full (Time, Place, and Person)  Thought Content:  Negative  Suicidal Thoughts:  No  Homicidal Thoughts:  No  Memory:  Immediate;   Good Recent;   Good Remote;   Good  Judgement:  Intact  Insight:  Good  Psychomotor Activity:  Normal  Concentration:  Good  Recall:  Good  Akathisia:  Negative  Handed:  Right  AIMS (if indicated):     Assets:  Communication Skills Desire for Improvement  Sleep:   poor  [name] [name] is continuing his detox, he is not suicidal but is depressed.  He has been accepted to OBS bed 1 and will be transferred today.

## 2014-05-06 DIAGNOSIS — F411 Generalized anxiety disorder: Secondary | ICD-10-CM

## 2014-05-06 DIAGNOSIS — F1994 Other psychoactive substance use, unspecified with psychoactive substance-induced mood disorder: Secondary | ICD-10-CM

## 2014-05-06 DIAGNOSIS — F1910 Other psychoactive substance abuse, uncomplicated: Secondary | ICD-10-CM

## 2014-05-06 MED ORDER — VITAMIN B-12 100 MCG PO TABS: 100.0000 ug | ORAL_TABLET | Freq: Every day | ORAL | Status: DC

## 2014-05-06 MED ORDER — TRAZODONE HCL 50 MG PO TABS
50.0000 mg | ORAL_TABLET | Freq: Every evening | ORAL | Status: DC | PRN
  Administered 2014-05-06: 50 mg via ORAL
  Filled 2014-05-06 (×4): qty 1

## 2014-05-06 MED ORDER — HYDROXYZINE HCL 25 MG PO TABS: 25.0000 mg | ORAL_TABLET | Freq: Four times a day (QID) | ORAL | Status: DC | PRN

## 2014-05-06 MED ORDER — ADULT MULTIVITAMIN W/MINERALS CH: 1.0000 | ORAL_TABLET | Freq: Every day | ORAL | Status: DC

## 2014-05-06 MED ORDER — PANTOPRAZOLE SODIUM 40 MG PO TBEC: 40.0000 mg | DELAYED_RELEASE_TABLET | Freq: Every day | ORAL | Status: DC

## 2014-05-06 MED ORDER — DOXEPIN HCL 25 MG PO CAPS: 25.0000 mg | ORAL_CAPSULE | Freq: Every evening | ORAL | Status: DC | PRN

## 2014-05-06 MED ORDER — ESCITALOPRAM OXALATE 10 MG PO TABS: 10.0000 mg | ORAL_TABLET | Freq: Every day | ORAL | Status: DC

## 2014-05-06 MED ORDER — GABAPENTIN 100 MG PO CAPS: 200.0000 mg | ORAL_CAPSULE | Freq: Two times a day (BID) | ORAL | Status: DC

## 2014-05-06 NOTE — Progress Notes (Signed)
Patient ID: [name] [name], male   DOB: November 30, 1960, 53 y.o.   MRN: [id number] D:  Patient discharged to home.  All belongings returned.  Patient denies suicidal ideation.  States plan is to stay with his sister and follow up with [facility] on Monday.   A:  Reviewed all discharge instructions, medications and follow up care.  Escorted patient to search room and then to lobby.  Patient was given a bus pass. R:  Verbalized understanding of all instructions.  Patient states he feels ready to leave the hospital.  Left the unit ambulatory.

## 2014-05-06 NOTE — H&P (Signed)
[place] OBS UNIT H&P   Patient Identification:  [name] [name] Date of Evaluation:  05/06/2014 Chief Complaint:  ALCOHOL ABUSE MAJOR DEPRESSION, SINGLE EPISODE  Subjective: Pt seen and chart reviewed. Pt has has very elevated liver enzymes and BAL. Pt reports that he wants inpatient detox and would like resources for this. Will observe pt overnight in OBS unit to determine appropriate disposition plan. Social worker to meet with pt in AM to assist with provision of resources.   History of Present Illness:: Patient has been drinking at least a six pack daily for the past month and a half. He started feeling physically bad and stopped eating, history of pancreatitis. Visibly in pain on assessment, liver enzymes in the past have been extremely elevated.Marland Kitchenawaiting lab result. Medically compromised at this point but once he clears, alcohol detox will be needed. Positive for depression but denies suicidal/homicidal ideations and hallucinations. His longest period of sobriety was five months when he was working and busy. He would like to go to detox and then [facility] to help maintain his sobriety.  Total Time spent with patient: 40 minutes  Psychiatric Specialty Exam: Physical Exam  Review of Systems  Constitutional: Negative.   HENT: Negative.   Eyes: Negative.   Respiratory: Negative.   Cardiovascular: Negative.   Gastrointestinal: Negative.   Genitourinary: Negative.   Musculoskeletal: Negative.   Skin: Negative.   Neurological: Negative.   Endo/Heme/Allergies: Negative.   Psychiatric/Behavioral: Positive for depression and substance abuse. The patient is nervous/anxious.     Blood pressure 144/96, pulse 110, temperature 98.5 F (36.9 C), temperature source Oral, resp. rate 18, height '5\' 9"'  (1.753 m), weight 59.648 kg (131 lb 8 oz), SpO2 97.00%.Body mass index is 19.41 kg/(m^2).   General Appearance: Disheveled  Eye Sport and exercise psychologist::  Fair  Speech:  Clear and Coherent and Normal Rate  Volume:  Normal   Mood:  Anxious  Affect:  Congruent  Thought Process:  Goal Directed and Intact  Orientation:  Full (Time, Place, and Person)  Thought Content:  WDL  Suicidal Thoughts:  No  Homicidal Thoughts:  No  Memory:  Immediate;   Fair Recent;   Fair Remote;   Fair  Judgement:  Intact  Insight:  Present  Psychomotor Activity:  Normal  Concentration:  Fair  Recall:  [place]  Language: Fair  Akathisia:  No  Handed:  Right  AIMS (if indicated):     Assets:  Communication Skills Desire for Improvement Housing Social Support  Sleep:      Past Medical History:   Past Medical History  Diagnosis Date  . Alcohol abuse   . Sarcoidosis of lung   . Reflux   . Depression   . Anxiety   . Pancreatitis   . Carpal tunnel syndrome of right wrist    None. Allergies:   Allergies  Allergen Reactions  . Oxycodone Other (See Comments)    abd pain, stomach cramps    PTA Medications: Prescriptions prior to admission  Medication Sig Dispense Refill  . doxepin (SINEQUAN) 25 MG capsule Take 25 mg by mouth at bedtime as needed. for insomnia.      Marland Kitchen escitalopram (LEXAPRO) 10 MG tablet Take 10 mg by mouth daily.      Marland Kitchen gabapentin (NEURONTIN) 100 MG capsule Take 200 mg by mouth 3 (three) times daily.      . pantoprazole (PROTONIX) 40 MG tablet Take 40 mg by mouth daily.      . vitamin B-12 (CYANOCOBALAMIN) 100  MCG tablet Take 100 mcg by mouth daily.      . [DISCONTINUED] doxepin (SINEQUAN) 25 MG capsule Take one at bedtime as needed for insomnia.  30 capsule  0  . [DISCONTINUED] escitalopram (LEXAPRO) 10 MG tablet Take 1 tablet (10 mg total) by mouth daily.  30 tablet  0  . [DISCONTINUED] gabapentin (NEURONTIN) 100 MG capsule Take 2 capsules (200 mg total) by mouth 3 (three) times daily.  180 capsule  0  . [DISCONTINUED] pantoprazole (PROTONIX) 40 MG tablet Take 1 tablet (40 mg total) by mouth daily.  30 tablet  0  . [DISCONTINUED] vitamin B-12 (CYANOCOBALAMIN) 100 MCG tablet Take  1 tablet (100 mcg total) by mouth daily.        Previous Psychotropic Medications:  Medication/Dose  SEE MAR               Substance Abuse History in the last 12 months:  yes  Consequences of Substance Abuse: Medical Consequences:  Hospitalization  Social History:  reports that he has quit smoking. His smoking use included Cigars. He does not have any smokeless tobacco history on file. He reports that he drinks alcohol. He reports that he does not use illicit drugs. Additional Social History:                       Family History:   Family History  Problem Relation Age of Onset  . Diabetes Father   . Hypertension Father   . Hypertension Paternal Grandfather   . Alcohol abuse Paternal Grandfather   . Alcohol abuse Maternal Grandfather   . Alcohol abuse Maternal Grandmother   . Alcohol abuse Paternal Grandmother     Results for orders placed during the hospital encounter of 05/04/14 (from the past 72 hour(s))  CBC WITH DIFFERENTIAL     Status: Abnormal   Collection Time    05/04/14  5:25 PM      Result Value Ref Range   WBC 5.8  4.0 - 10.5 K/uL   RBC 5.24  4.22 - 5.81 MIL/uL   Hemoglobin 14.1  13.0 - 17.0 g/dL   HCT 41.7  39.0 - 52.0 %   MCV 79.6  78.0 - 100.0 fL   MCH 26.9  26.0 - 34.0 pg   MCHC 33.8  30.0 - 36.0 g/dL   RDW 16.9 (*) 11.5 - 15.5 %   Platelets 143 (*) 150 - 400 K/uL   Neutrophils Relative % 62  43 - 77 %   Neutro Abs 3.6  1.7 - 7.7 K/uL   Lymphocytes Relative 26  12 - 46 %   Lymphs Abs 1.5  0.7 - 4.0 K/uL   Monocytes Relative 11  3 - 12 %   Monocytes Absolute 0.6  0.1 - 1.0 K/uL   Eosinophils Relative 0  0 - 5 %   Eosinophils Absolute 0.0  0.0 - 0.7 K/uL   Basophils Relative 1  0 - 1 %   Basophils Absolute 0.1  0.0 - 0.1 K/uL  COMPREHENSIVE METABOLIC PANEL     Status: Abnormal   Collection Time    05/04/14  5:25 PM      Result Value Ref Range   Sodium 138  137 - 147 mEq/L   Potassium 4.5  3.7 - 5.3 mEq/L   Chloride 92 (*) 96 -  112 mEq/L   CO2 22  19 - 32 mEq/L   Glucose, Bld 67 (*) 70 - 99 mg/dL   BUN  9  6 - 23 mg/dL   Creatinine, Ser 0.86  0.50 - 1.35 mg/dL   Calcium 9.7  8.4 - 10.5 mg/dL   Total Protein 9.7 (*) 6.0 - 8.3 g/dL   Albumin 5.1  3.5 - 5.2 g/dL   AST 180 (*) 0 - 37 U/L   ALT 168 (*) 0 - 53 U/L   Alkaline Phosphatase 44  39 - 117 U/L   Total Bilirubin 0.6  0.3 - 1.2 mg/dL   GFR calc non Af Amer >90  >90 mL/min   GFR calc Af Amer >90  >90 mL/min   Comment: (NOTE)     The eGFR has been calculated using the CKD EPI equation.     This calculation has not been validated in all clinical situations.     eGFR's persistently <90 mL/min signify possible Chronic Kidney     Disease.  LIPASE, BLOOD     Status: Abnormal   Collection Time    05/04/14  5:25 PM      Result Value Ref Range   Lipase 207 (*) 11 - 59 U/L  ETHANOL     Status: Abnormal   Collection Time    05/04/14  5:25 PM      Result Value Ref Range   Alcohol, Ethyl (B) 259 (*) 0 - 11 mg/dL   Comment:            LOWEST DETECTABLE LIMIT FOR     SERUM ALCOHOL IS 11 mg/dL     FOR MEDICAL PURPOSES ONLY  URINALYSIS, ROUTINE W REFLEX MICROSCOPIC     Status: Abnormal   Collection Time    05/04/14  5:30 PM      Result Value Ref Range   Color, Urine YELLOW  YELLOW   APPearance CLEAR  CLEAR   Specific Gravity, Urine 1.007  1.005 - 1.030   pH 5.5  5.0 - 8.0   Glucose, UA NEGATIVE  NEGATIVE mg/dL   Hgb urine dipstick NEGATIVE  NEGATIVE   Bilirubin Urine NEGATIVE  NEGATIVE   Ketones, ur NEGATIVE  NEGATIVE mg/dL   Protein, ur 100 (*) NEGATIVE mg/dL   Urobilinogen, UA 0.2  0.0 - 1.0 mg/dL   Nitrite NEGATIVE  NEGATIVE   Leukocytes, UA NEGATIVE  NEGATIVE  URINE MICROSCOPIC-ADD ON     Status: Abnormal   Collection Time    05/04/14  5:30 PM      Result Value Ref Range   Squamous Epithelial / LPF RARE  RARE   WBC, UA 0-2  <3 WBC/hpf   RBC / HPF 0-2  <3 RBC/hpf   Casts GRANULAR CAST (*) NEGATIVE   Comment: HYALINE CASTS  URINE RAPID DRUG SCREEN  (HOSP PERFORMED)     Status: None   Collection Time    05/05/14  4:09 AM      Result Value Ref Range   Opiates NONE DETECTED  NONE DETECTED   Cocaine NONE DETECTED  NONE DETECTED   Benzodiazepines NONE DETECTED  NONE DETECTED   Amphetamines NONE DETECTED  NONE DETECTED   Tetrahydrocannabinol NONE DETECTED  NONE DETECTED   Barbiturates NONE DETECTED  NONE DETECTED   Comment:            DRUG SCREEN FOR MEDICAL PURPOSES     ONLY.  IF CONFIRMATION IS NEEDED     FOR ANY PURPOSE, NOTIFY LAB     WITHIN 5 DAYS.                LOWEST  DETECTABLE LIMITS     FOR URINE DRUG SCREEN     Drug Class       Cutoff (ng/mL)     Amphetamine      1000     Barbiturate      200     Benzodiazepine   595     Tricyclics       638     Opiates          300     Cocaine          300     THC              50   Psychological Evaluations:  Assessment:   DSM5:  Substance/Addictive Disorders:  Alcohol Related Disorder - Severe (303.90) Depressive Disorders:  Major Depressive Disorder - Severe (296.23)  AXIS I:  Alcohol Abuse, Generalized Anxiety Disorder, Major Depression, Recurrent severe, Substance Abuse and Substance Induced Mood Disorder AXIS II:  Deferred AXIS III:   Past Medical History  Diagnosis Date  . Alcohol abuse   . Sarcoidosis of lung   . Reflux   . Depression   . Anxiety   . Pancreatitis   . Carpal tunnel syndrome of right wrist    AXIS IV:  other psychosocial or environmental problems and problems related to social environment AXIS V:  41-50 serious symptoms  Treatment Plan/Recommendations:   Review of chart, vital signs, medications, and notes.  1-Medication management for depression and anxiety: Medications reviewed with the patient and she stated no untoward effects, unchanged. 2-Coping skills for depression, anxiety  3-Continue crisis stabilization and management  4-Address health issues--monitoring vital signs, stable  5-Treatment plan in progress to prevent relapse of  depression and anxiety  Treatment Plan Summary: Daily contact with patient to assess and evaluate symptoms and progress in treatment Medication management Current Medications:  Current Facility-Administered Medications  Medication Dose Route Frequency Provider Last Rate Last Dose  . acetaminophen (TYLENOL) tablet 650 mg  650 mg Oral Q6H PRN Clarene Reamer, MD      . alum & mag hydroxide-simeth (MAALOX/MYLANTA) 200-200-20 MG/5ML suspension 30 mL  30 mL Oral Q4H PRN Clarene Reamer, MD      . escitalopram (LEXAPRO) tablet 10 mg  10 mg Oral Daily Clarene Reamer, MD   10 mg at 05/06/14 0813  . gabapentin (NEURONTIN) capsule 200 mg  200 mg Oral BID Clarene Reamer, MD   200 mg at 05/06/14 7564  . hydrOXYzine (ATARAX/VISTARIL) tablet 25 mg  25 mg Oral Q6H PRN Hampton Abbot, MD   25 mg at 05/05/14 2025  . ibuprofen (ADVIL,MOTRIN) tablet 600 mg  600 mg Oral Q8H PRN Clarene Reamer, MD      . loperamide (IMODIUM) capsule 2-4 mg  2-4 mg Oral PRN Hampton Abbot, MD      . LORazepam (ATIVAN) tablet 1 mg  1 mg Oral TID Hampton Abbot, MD   1 mg at 05/06/14 0813   Followed by  . [START ON 05/07/2014] LORazepam (ATIVAN) tablet 1 mg  1 mg Oral BID Hampton Abbot, MD       Followed by  . [START ON 05/08/2014] LORazepam (ATIVAN) tablet 1 mg  1 mg Oral Once Hampton Abbot, MD      . LORazepam (ATIVAN) tablet 1 mg  1 mg Oral Q6H PRN Hampton Abbot, MD      . magnesium hydroxide (MILK OF MAGNESIA) suspension 30 mL  30 mL Oral Daily PRN Berneta Sages  Fredrich Birks, MD      . multivitamin with minerals tablet 1 tablet  1 tablet Oral Daily Hampton Abbot, MD   1 tablet at 05/06/14 0813  . nicotine (NICODERM CQ - dosed in mg/24 hours) patch 21 mg  21 mg Transdermal Daily Clarene Reamer, MD      . ondansetron (ZOFRAN-ODT) disintegrating tablet 4 mg  4 mg Oral Q6H PRN Hampton Abbot, MD   4 mg at 05/05/14 1720  . thiamine (VITAMIN B-1) tablet 100 mg  100 mg Oral Daily Hampton Abbot, MD   100 mg at 05/06/14 0488  . traZODone (DESYREL)  tablet 50 mg  50 mg Oral QHS,MR X 1 Laverle Hobby, PA-C   50 mg at 05/06/14 0008    Benjamine Mola, FNP-BC 05/05/2014 4:05 PM

## 2014-05-06 NOTE — BH Assessment (Addendum)
[facility] Assessment Progress Note  After consulting with Hampton Abbot, MD it has been determined that pt does not present a life threatening danger to himself or others, and that psychiatric hospitalization is not indicated for him at this time. Pt has expressed a preference for placement in a residential substance abuse facility.  I then spoke to the pt in person, and he confirms that this is his preference.  He is especially interested in being admitted to the [facility] facility on [address].  At 11:55 I called [facility] and spoke to [place].  She has scheduled an screening appointment for the pt on Monday 05/11/2014 at 08:00.  Screening does not guarantee admission, but pt is to present with all preparation in place for him to be admitted immediately, including a 30 day supply of any medications that he will need.  This has been discussed with the pt.  Details will be included in the pt's discharge instructions.  Pt's discharge instructions also include referrals to [facility] for his ongoing mental health needs, as well as the [facility] and [facility] for his ongoing primary care needs.  Jalene Mullet, MA Triage Specialist 05/06/2014 @ 12:02

## 2014-05-06 NOTE — [facility] Suicide Risk Assessment (Signed)
Discharge Summary  HPI: Patient has been drinking at least a six pack daily for the past month and a half. He started feeling physically bad and stopped eating, history of pancreatitis. Visibly in pain on assessment, liver enzymes in the past have been extremely elevated.Marland Kitchenawaiting lab result. Medically compromised at this point but once he clears, alcohol detox will be needed. Positive for depression but denies suicidal/homicidal ideations and hallucinations. His longest period of sobriety was five months when he was working and busy. He would like to go to detox and then [facility] to help maintain his sobriety.   Demographic Factors:  Male and Low socioeconomic status  Total Time spent with patient: 15 minutes  Psychiatric Specialty Exam: Physical Exam  ROS  Blood pressure 144/96, pulse 110, temperature 98.5 F (36.9 C), temperature source Oral, resp. rate 18, height 5\' 9"  (1.753 m), weight 131 lb 8 oz (59.648 kg), SpO2 97.00%.Body mass index is 19.41 kg/(m^2).  General Appearance: Disheveled  Eye Sport and exercise psychologist::  Fair  Speech:  Clear and Coherent and Normal Rate  Volume:  Normal  Mood:  Anxious  Affect:  Congruent  Thought Process:  Goal Directed and Intact  Orientation:  Full (Time, Place, and Person)  Thought Content:  WDL  Suicidal Thoughts:  No  Homicidal Thoughts:  No  Memory:  Immediate;   Fair Recent;   Fair Remote;   Fair  Judgement:  Intact  Insight:  Present  Psychomotor Activity:  Normal  Concentration:  Fair  Recall:  [employer] [place]  Language: Fair  Akathisia:  No  Handed:  Right  AIMS (if indicated):     Assets:  Communication Skills Desire for Improvement Housing Social Support  Sleep:       Musculoskeletal: Strength & Muscle Tone: within normal limits Gait & Station: normal Patient leans: N/A   Mental Status Per Nursing Assessment::   On Admission:     Current Mental Status by Physician: NA  Loss  Factors: Decrease in vocational status and Decline in physical health  Historical Factors: NA  Risk Reduction Factors:   NA  Continued Clinical Symptoms:  Alcohol/Substance Abuse/Dependencies More than one psychiatric diagnosis Previous Psychiatric Diagnoses and Treatments  Cognitive Features That Contribute To Risk:  None  Suicide Risk:  Minimal: No identifiable suicidal ideation.  Patients presenting with no risk factors but with morbid ruminations; may be classified as minimal risk based on the severity of the depressive symptoms  Discharge Diagnoses:   AXIS I:  Generalized Anxiety Disorder, Substance Abuse and Substance Induced Mood Disorder AXIS II:  Deferred AXIS III:   Past Medical History  Diagnosis Date  . Alcohol abuse   . Sarcoidosis of lung   . Reflux   . Depression   . Anxiety   . Pancreatitis   . Carpal tunnel syndrome of right wrist    AXIS IV:  other psychosocial or environmental problems AXIS V:  51-60 moderate symptoms  Plan Of Care/Follow-up recommendations:  Activity:  As tolerated Diet:  regular Other:  Follow up with oupatient provider  Is patient on multiple antipsychotic therapies at discharge:  No   Has Patient had three or more failed trials of antipsychotic monotherapy by history:  No  Recommended Plan for Multiple Antipsychotic Therapies: NA    KUMAR,ARCHANA 05/06/2014, 9:48 AM

## 2014-05-06 NOTE — Discharge Summary (Signed)
                                   Discharge Summary  HPI: Patient has been drinking at least a six pack daily for the past month and a half. He started feeling physically bad and stopped eating, history of pancreatitis. Visibly in pain on assessment, liver enzymes in the past have been extremely elevated.Marland Kitchenawaiting lab result. Medically compromised at this point but once he clears, alcohol detox will be needed. Positive for depression but denies suicidal/homicidal ideations and hallucinations. His longest period of sobriety was five months when he was working and busy. He would like to go to detox and then [facility] to help maintain his sobriety.   Demographic Factors:  Male and Low socioeconomic status  Total Time spent with patient: 15 minutes  Psychiatric Specialty Exam: Physical Exam  ROS  Blood pressure 144/96, pulse 110, temperature 98.5 F (36.9 C), temperature source Oral, resp. rate 18, height 5\' 9"  (1.753 m), weight 131 lb 8 oz (59.648 kg), SpO2 97.00%.Body mass index is 19.41 kg/(m^2).  General Appearance: Disheveled  Eye Sport and exercise psychologist::  Fair  Speech:  Clear and Coherent and Normal Rate  Volume:  Normal  Mood:  Anxious  Affect:  Congruent  Thought Process:  Goal Directed and Intact  Orientation:  Full (Time, Place, and Person)  Thought Content:  WDL  Suicidal Thoughts:  No  Homicidal Thoughts:  No  Memory:  Immediate;   Fair Recent;   Fair Remote;   Fair  Judgement:  Intact  Insight:  Present  Psychomotor Activity:  Normal  Concentration:  Fair  Recall:  [employer] [place]  Language: Fair  Akathisia:  No  Handed:  Right  AIMS (if indicated):     Assets:  Communication Skills Desire for Improvement Housing Social Support  Sleep:       Musculoskeletal: Strength & Muscle Tone: within normal limits Gait & Station: normal Patient leans: N/A   Mental Status Per Nursing Assessment::   On Admission:     Current Mental Status by Physician: NA  Loss  Factors: Decrease in vocational status and Decline in physical health  Historical Factors: NA  Risk Reduction Factors:   NA  Continued Clinical Symptoms:  Alcohol/Substance Abuse/Dependencies More than one psychiatric diagnosis Previous Psychiatric Diagnoses and Treatments  Cognitive Features That Contribute To Risk:  None  Suicide Risk:  Minimal: No identifiable suicidal ideation.  Patients presenting with no risk factors but with morbid ruminations; may be classified as minimal risk based on the severity of the depressive symptoms  Discharge Diagnoses:   AXIS I:  Generalized Anxiety Disorder, Substance Abuse and Substance Induced Mood Disorder AXIS II:  Deferred AXIS III:   Past Medical History  Diagnosis Date  . Alcohol abuse   . Sarcoidosis of lung   . Reflux   . Depression   . Anxiety   . Pancreatitis   . Carpal tunnel syndrome of right wrist    AXIS IV:  other psychosocial or environmental problems AXIS V:  51-60 moderate symptoms  Plan Of Care/Follow-up recommendations:  Activity:  As tolerated Diet:  regular Other:  Follow up with oupatient provider  Is patient on multiple antipsychotic therapies at discharge:  No   Has Patient had three or more failed trials of antipsychotic monotherapy by history:  No  Recommended Plan for Multiple Antipsychotic Therapies: NA

## 2014-05-06 NOTE — Discharge Instructions (Addendum)
You have a screening appointment scheduled at [facility] on Monday, 05/11/2014 at 8:00 am.  While screening does not guarantee admission, you should present that day with all preparations in place to be admitted on the spot.  This should include a 30 day supply of any medications that you will need during your stay.       [facility]      [address] [place], [place] [address]      [phone]  For your ongoing mental health needs, contact [name].  They have a walk-in clinic available Monday - Friday from 8:00 am - 3:00 pm.  Patients are seen on a first come, first serve basis; for the best chance of being seen on any given day, plan to go there as early as possible.  They may also be able to help you to obtain medications.  [facility] also hosts a crisis center, available 24 hours a day, 7 days a week, at the same location.       [facility]      [address], [place] [address]      [phone]  For your primary care medical needs, [facility] operates a [facility].  Consider contacting them for your ongoing primary care needs.       [facility]      [address] [place], [place] [address]      [phone]      Hours of operation: Monday - Friday 9:00 am - 6:00 pm

## 2014-05-07 NOTE — H&P (Signed)
Patient admitted to [facility]. observation unit for alcohol detox

## 2014-05-08 ENCOUNTER — Emergency Department (HOSPITAL_BASED_OUTPATIENT_CLINIC_OR_DEPARTMENT_OTHER): Payer: Self-pay

## 2014-05-08 ENCOUNTER — Encounter (HOSPITAL_BASED_OUTPATIENT_CLINIC_OR_DEPARTMENT_OTHER): Payer: Self-pay | Admitting: Emergency Medicine

## 2014-05-08 ENCOUNTER — Emergency Department (HOSPITAL_BASED_OUTPATIENT_CLINIC_OR_DEPARTMENT_OTHER)
Admission: EM | Admit: 2014-05-08 | Discharge: 2014-05-08 | Disposition: A | Discharge status: Home / Self Care | Payer: Self-pay | Attending: Emergency Medicine | Admitting: Emergency Medicine

## 2014-05-08 DIAGNOSIS — F1011 Alcohol abuse, in remission: Secondary | ICD-10-CM

## 2014-05-08 DIAGNOSIS — R1013 Epigastric pain: Secondary | ICD-10-CM | POA: Insufficient documentation

## 2014-05-08 DIAGNOSIS — Z8709 Personal history of other diseases of the respiratory system: Secondary | ICD-10-CM | POA: Insufficient documentation

## 2014-05-08 DIAGNOSIS — K219 Gastro-esophageal reflux disease without esophagitis: Secondary | ICD-10-CM | POA: Insufficient documentation

## 2014-05-08 DIAGNOSIS — Z87891 Personal history of nicotine dependence: Secondary | ICD-10-CM | POA: Insufficient documentation

## 2014-05-08 DIAGNOSIS — Z79899 Other long term (current) drug therapy: Secondary | ICD-10-CM | POA: Insufficient documentation

## 2014-05-08 DIAGNOSIS — F329 Major depressive disorder, single episode, unspecified: Secondary | ICD-10-CM | POA: Insufficient documentation

## 2014-05-08 DIAGNOSIS — F411 Generalized anxiety disorder: Secondary | ICD-10-CM | POA: Insufficient documentation

## 2014-05-08 DIAGNOSIS — R197 Diarrhea, unspecified: Secondary | ICD-10-CM | POA: Insufficient documentation

## 2014-05-08 DIAGNOSIS — F3289 Other specified depressive episodes: Secondary | ICD-10-CM | POA: Insufficient documentation

## 2014-05-08 DIAGNOSIS — Z8669 Personal history of other diseases of the nervous system and sense organs: Secondary | ICD-10-CM | POA: Insufficient documentation

## 2014-05-08 HISTORY — DX: Gastro-esophageal reflux disease without esophagitis: K21.9

## 2014-05-08 LAB — COMPREHENSIVE METABOLIC PANEL
ALT: 109 U/L — ABNORMAL HIGH (ref 0–53)
AST: 102 U/L — ABNORMAL HIGH (ref 0–37)
Albumin: 4.1 g/dL (ref 3.5–5.2)
Alkaline Phosphatase: 38 U/L — ABNORMAL LOW (ref 39–117)
BUN: 9 mg/dL (ref 6–23)
CO2: 25 mEq/L (ref 19–32)
Calcium: 9.8 mg/dL (ref 8.4–10.5)
Chloride: 98 mEq/L (ref 96–112)
Creatinine, Ser: 1.1 mg/dL (ref 0.50–1.35)
GFR calc Af Amer: 87 mL/min — ABNORMAL LOW (ref >90)
GFR calc non Af Amer: 75 mL/min — ABNORMAL LOW (ref >90)
Glucose, Bld: 117 mg/dL — ABNORMAL HIGH (ref 70–99)
Potassium: 3.6 mEq/L — ABNORMAL LOW (ref 3.7–5.3)
Sodium: 136 mEq/L — ABNORMAL LOW (ref 137–147)
Total Bilirubin: 0.7 mg/dL (ref 0.3–1.2)
Total Protein: 8.5 g/dL — ABNORMAL HIGH (ref 6.0–8.3)

## 2014-05-08 LAB — CBC WITH DIFFERENTIAL/PLATELET
Basophils Absolute: 0 10*3/uL (ref 0.0–0.1)
Basophils Relative: 1 % (ref 0–1)
Eosinophils Absolute: 0.1 10*3/uL (ref 0.0–0.7)
Eosinophils Relative: 2 % (ref 0–5)
HCT: 40 % (ref 39.0–52.0)
Hemoglobin: 13.3 g/dL (ref 13.0–17.0)
Lymphocytes Relative: 14 % (ref 12–46)
Lymphs Abs: 0.5 10*3/uL — ABNORMAL LOW (ref 0.7–4.0)
MCH: 27.4 pg (ref 26.0–34.0)
MCHC: 33.3 g/dL (ref 30.0–36.0)
MCV: 82.5 fL (ref 78.0–100.0)
Monocytes Absolute: 0.7 10*3/uL (ref 0.1–1.0)
Monocytes Relative: 19 % — ABNORMAL HIGH (ref 3–12)
Neutro Abs: 2.6 10*3/uL (ref 1.7–7.7)
Neutrophils Relative %: 64 % (ref 43–77)
Platelets: 108 10*3/uL — ABNORMAL LOW (ref 150–400)
RBC: 4.85 MIL/uL (ref 4.22–5.81)
RDW: 17.1 % — ABNORMAL HIGH (ref 11.5–15.5)
Smear Review: DECREASED
WBC: 3.9 10*3/uL — ABNORMAL LOW (ref 4.0–10.5)

## 2014-05-08 LAB — URINALYSIS, ROUTINE W REFLEX MICROSCOPIC
Bilirubin Urine: NEGATIVE
Glucose, UA: NEGATIVE mg/dL
Hgb urine dipstick: NEGATIVE
Ketones, ur: 15 mg/dL — AB
Leukocytes, UA: NEGATIVE
Nitrite: NEGATIVE
Protein, ur: NEGATIVE mg/dL
Specific Gravity, Urine: 1.023 (ref 1.005–1.030)
Urobilinogen, UA: 1 mg/dL (ref 0.0–1.0)
pH: 6.5 (ref 5.0–8.0)

## 2014-05-08 LAB — LIPASE, BLOOD: Lipase: 183 U/L — ABNORMAL HIGH (ref 11–59)

## 2014-05-08 MED ORDER — ONDANSETRON HCL 4 MG/2ML IJ SOLN
4.0000 mg | Freq: Once | INTRAMUSCULAR | Status: AC
  Administered 2014-05-08: 4 mg via INTRAVENOUS
  Filled 2014-05-08: qty 2

## 2014-05-08 MED ORDER — OMEPRAZOLE 20 MG PO CPDR: DELAYED_RELEASE_CAPSULE | ORAL | Status: DC

## 2014-05-08 MED ORDER — SODIUM CHLORIDE 0.9 % IV SOLN
Freq: Once | INTRAVENOUS | Status: AC
  Administered 2014-05-08: 09:00:00 via INTRAVENOUS

## 2014-05-08 MED ORDER — HYDROXYZINE HCL 25 MG PO TABS
25.0000 mg | ORAL_TABLET | Freq: Once | ORAL | Status: AC
  Administered 2014-05-08: 25 mg via ORAL
  Filled 2014-05-08: qty 1

## 2014-05-08 NOTE — ED Notes (Signed)
Patient transported to Ultrasound 

## 2014-05-08 NOTE — ED Notes (Signed)
Pt reports feeling anxious and requesting "something for my nerves".

## 2014-05-08 NOTE — ED Provider Notes (Signed)
CSN: [id number]     Arrival date & time 05/08/14  8299 History   First MD Initiated Contact with Patient 05/08/14 [phone]     Chief Complaint  Patient presents with  . Nausea  . Diarrhea     (Consider location/radiation/quality/duration/timing/severity/associated sxs/prior Treatment) HPI Comments: Patient is a 53 year old male with history of alcoholism. He is currently in treatment at [facility]. Presents with complaints of a six-week history of nausea, vomiting, and diarrhea. He states he has been having difficulty eating because this causes discomfort. He states he has had pancreatitis in the past but this feels somewhat different. He denies any fevers or chills. He denies any urinary complaints.  Patient is a 53 y.o. male presenting with diarrhea. The history is provided by the patient.  Diarrhea Quality:  Watery Severity:  Moderate Onset quality:  Gradual Number of episodes:  6 Timing:  Constant Progression:  Worsening Relieved by:  Nothing Worsened by:  Nothing tried Ineffective treatments:  None tried Associated symptoms: abdominal pain and vomiting   Associated symptoms: no chills and no fever     Past Medical History  Diagnosis Date  . Alcohol abuse   . Sarcoidosis of lung   . Reflux   . Depression   . Anxiety   . Pancreatitis   . Carpal tunnel syndrome of right wrist   . GERD (gastroesophageal reflux disease)    Past Surgical History  Procedure Laterality Date  . Knee arthroscopy    . Carpel tunnel release    . Elbow surgery     Family History  Problem Relation Age of Onset  . Diabetes Father   . Hypertension Father   . Hypertension Paternal Grandfather   . Alcohol abuse Paternal Grandfather   . Alcohol abuse Maternal Grandfather   . Alcohol abuse Maternal Grandmother   . Alcohol abuse Paternal Grandmother    History  Substance Use Topics  . Smoking status: Former Smoker    Types: Cigars  . Smokeless tobacco: Not on file  . Alcohol Use: 0.0 oz/week      Comment: 3 1/2 40oz beers/daily last use today  last drink 1 week ago in [place]    Review of Systems  Constitutional: Negative for fever and chills.  Gastrointestinal: Positive for vomiting, abdominal pain and diarrhea.  All other systems reviewed and are negative.     Allergies  Oxycodone  Home Medications   Prior to Admission medications   Medication Sig Start Date End Date Taking? Authorizing Provider  doxepin (SINEQUAN) 25 MG capsule Take 1 capsule (25 mg total) by mouth at bedtime as needed. for insomnia. 05/06/14   Benjamine Mola, FNP  escitalopram (LEXAPRO) 10 MG tablet Take 1 tablet (10 mg total) by mouth daily. 05/06/14   Benjamine Mola, FNP  gabapentin (NEURONTIN) 100 MG capsule Take 2 capsules (200 mg total) by mouth 2 (two) times daily. 05/06/14   Benjamine Mola, FNP  hydrOXYzine (ATARAX/VISTARIL) 25 MG tablet Take 1 tablet (25 mg total) by mouth every 6 (six) hours as needed for anxiety. 05/06/14   Benjamine Mola, FNP  Multiple Vitamin (MULTIVITAMIN WITH MINERALS) TABS tablet Take 1 tablet by mouth daily. 05/06/14   Benjamine Mola, FNP  pantoprazole (PROTONIX) 40 MG tablet Take 1 tablet (40 mg total) by mouth daily. 05/06/14   Benjamine Mola, FNP  vitamin B-12 (CYANOCOBALAMIN) 100 MCG tablet Take 1 tablet (100 mcg total) by mouth daily. 05/06/14   Benjamine Mola, FNP   BP  146/94  Pulse 80  Temp(Src) 99.3 F (37.4 C) (Oral)  Resp 16  Ht 5\' 9"  (1.753 m)  Wt 143 lb (64.864 kg)  BMI 21.11 kg/m2  SpO2 97% Physical Exam  Nursing note and vitals reviewed. Constitutional: He is oriented to person, place, and time. He appears well-developed and well-nourished. No distress.  HENT:  Head: Normocephalic and atraumatic.  Mouth/Throat: Oropharynx is clear and moist.  Neck: Normal range of motion. Neck supple.  Cardiovascular: Normal rate, regular rhythm and normal heart sounds.   No murmur heard. Pulmonary/Chest: Effort normal and breath sounds normal. No respiratory distress.   Abdominal: Soft. Bowel sounds are normal. He exhibits no distension. There is tenderness.  There is tenderness to palpation in the epigastric region. There is no rebound and no guarding.  Musculoskeletal: Normal range of motion. He exhibits no edema.  Neurological: He is alert and oriented to person, place, and time.  Skin: Skin is warm and dry. He is not diaphoretic.    ED Course  Procedures (including critical care time) Labs Review Labs Reviewed  URINALYSIS, ROUTINE W REFLEX MICROSCOPIC  CBC WITH DIFFERENTIAL  COMPREHENSIVE METABOLIC PANEL  LIPASE, BLOOD    Imaging Review No results found.   EKG Interpretation None      MDM   Final diagnoses:  None    Patient presents with generalized abdominal discomfort, nausea, difficulty eating for the past 6 weeks. He has a history of chronic alcoholism and recently entered treatment at [facility]. His workup reveals no elevation of white count and laboratory studies are remarkable for mild elevations of AST, ALT, and lipase. 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  I elected to obtain an ultrasound which revealed sludge in the gallbladder, but no evidence for acute cholecystitis. I see no emergent process and feel as though he is appropriate for discharge. I will prescribe Prilosec and advised followup with Gen. Surgery.     Veryl Speak, MD 05/08/14 [phone]

## 2014-05-08 NOTE — Discharge Instructions (Signed)
Prilosec as prescribed.  Your ultrasound has shown that you have sludge in your gallbladder. You should follow this finding up with general surgery to determine the appropriate course of action. The contact information for [facility] has been provided on this discharge summary.  Return to the emergency department if you develop a significant worsening of your pain, difficulty breathing, high fever, or other new and concerning symptoms.   Abdominal Pain, Adult Many things can cause abdominal pain. Usually, abdominal pain is not caused by a disease and will improve without treatment. It can often be observed and treated at home. Your health care provider will do a physical exam and possibly order blood tests and X-rays to help determine the seriousness of your pain. However, in many cases, more time must pass before a clear cause of the pain can be found. Before that point, your health care provider may not know if you need more testing or further treatment. HOME CARE INSTRUCTIONS  Monitor your abdominal pain for any changes. The following actions may help to alleviate any discomfort you are experiencing:  Only take over-the-counter or prescription medicines as directed by your health care provider.  Do not take laxatives unless directed to do so by your health care provider.  Try a clear liquid diet (broth, tea, or water) as directed by your health care provider. Slowly move to a bland diet as tolerated. SEEK MEDICAL CARE IF:  You have unexplained abdominal pain.  You have abdominal pain associated with nausea or diarrhea.  You have pain when you urinate or have a bowel movement.  You experience abdominal pain that wakes you in the night.  You have abdominal pain that is worsened or improved by eating food.  You have abdominal pain that is worsened with eating fatty foods. SEEK IMMEDIATE MEDICAL CARE IF:   Your pain does not go away within 2 hours.  You have a  fever.  You keep throwing up (vomiting).  Your pain is felt only in portions of the abdomen, such as the right side or the left lower portion of the abdomen.  You pass bloody or black tarry stools. MAKE SURE YOU:  Understand these instructions.   Will watch your condition.   Will get help right away if you are not doing well or get worse.  Document Released: 08/23/2005 Document Revised: 09/03/2013 Document Reviewed: 07/23/2013 [facility] Patient Information 2014 [place].

## 2014-05-08 NOTE — ED Notes (Addendum)
Nausea and diarrhea x 3 weeks every day.  Diarrhea numerous times during the day. Abdominal pain intermittently for months.  Evaluated at [facility] ED for abdominal pain and dx with pancreatitis. States he cannot  Hold anything down" and lays around all the time.  Protonix isn't working.

## 2014-05-08 NOTE — ED Notes (Signed)
MD at bedside. 

## 2014-05-31 ENCOUNTER — Emergency Department (HOSPITAL_COMMUNITY)
Admission: EM | Admit: 2014-05-31 | Discharge: 2014-06-01 | Disposition: A | Discharge status: Other Acute Inpatient Hospital | Payer: Self-pay | Attending: Emergency Medicine | Admitting: Emergency Medicine

## 2014-05-31 ENCOUNTER — Encounter (HOSPITAL_COMMUNITY): Payer: Self-pay | Admitting: Emergency Medicine

## 2014-05-31 DIAGNOSIS — F1010 Alcohol abuse, uncomplicated: Secondary | ICD-10-CM | POA: Insufficient documentation

## 2014-05-31 DIAGNOSIS — F10920 Alcohol use, unspecified with intoxication, uncomplicated: Secondary | ICD-10-CM

## 2014-05-31 DIAGNOSIS — R209 Unspecified disturbances of skin sensation: Secondary | ICD-10-CM | POA: Insufficient documentation

## 2014-05-31 DIAGNOSIS — R630 Anorexia: Secondary | ICD-10-CM | POA: Insufficient documentation

## 2014-05-31 DIAGNOSIS — M25569 Pain in unspecified knee: Secondary | ICD-10-CM | POA: Insufficient documentation

## 2014-05-31 DIAGNOSIS — G8929 Other chronic pain: Secondary | ICD-10-CM | POA: Insufficient documentation

## 2014-05-31 DIAGNOSIS — R197 Diarrhea, unspecified: Secondary | ICD-10-CM | POA: Insufficient documentation

## 2014-05-31 DIAGNOSIS — K219 Gastro-esophageal reflux disease without esophagitis: Secondary | ICD-10-CM | POA: Insufficient documentation

## 2014-05-31 DIAGNOSIS — R1013 Epigastric pain: Secondary | ICD-10-CM | POA: Insufficient documentation

## 2014-05-31 DIAGNOSIS — Z8619 Personal history of other infectious and parasitic diseases: Secondary | ICD-10-CM | POA: Insufficient documentation

## 2014-05-31 DIAGNOSIS — M25579 Pain in unspecified ankle and joints of unspecified foot: Secondary | ICD-10-CM | POA: Insufficient documentation

## 2014-05-31 DIAGNOSIS — F3289 Other specified depressive episodes: Secondary | ICD-10-CM | POA: Insufficient documentation

## 2014-05-31 DIAGNOSIS — Z87891 Personal history of nicotine dependence: Secondary | ICD-10-CM | POA: Insufficient documentation

## 2014-05-31 DIAGNOSIS — F411 Generalized anxiety disorder: Secondary | ICD-10-CM | POA: Insufficient documentation

## 2014-05-31 DIAGNOSIS — Z8669 Personal history of other diseases of the nervous system and sense organs: Secondary | ICD-10-CM | POA: Insufficient documentation

## 2014-05-31 DIAGNOSIS — Z79899 Other long term (current) drug therapy: Secondary | ICD-10-CM | POA: Insufficient documentation

## 2014-05-31 DIAGNOSIS — Z96659 Presence of unspecified artificial knee joint: Secondary | ICD-10-CM | POA: Insufficient documentation

## 2014-05-31 DIAGNOSIS — F329 Major depressive disorder, single episode, unspecified: Secondary | ICD-10-CM | POA: Insufficient documentation

## 2014-05-31 LAB — RAPID URINE DRUG SCREEN, HOSP PERFORMED
Amphetamines: NOT DETECTED
Barbiturates: NOT DETECTED
Benzodiazepines: NOT DETECTED
Cocaine: NOT DETECTED
Opiates: NOT DETECTED
Tetrahydrocannabinol: NOT DETECTED

## 2014-05-31 LAB — CBC
HCT: 40.6 % (ref 39.0–52.0)
Hemoglobin: 13.6 g/dL (ref 13.0–17.0)
MCH: 26.5 pg (ref 26.0–34.0)
MCHC: 33.5 g/dL (ref 30.0–36.0)
MCV: 79 fL (ref 78.0–100.0)
Platelets: 95 10*3/uL — ABNORMAL LOW (ref 150–400)
RBC: 5.14 MIL/uL (ref 4.22–5.81)
RDW: 17.2 % — ABNORMAL HIGH (ref 11.5–15.5)
WBC: 4.8 10*3/uL (ref 4.0–10.5)

## 2014-05-31 LAB — COMPREHENSIVE METABOLIC PANEL
ALT: 110 U/L — ABNORMAL HIGH (ref 0–53)
AST: 170 U/L — ABNORMAL HIGH (ref 0–37)
Albumin: 4.3 g/dL (ref 3.5–5.2)
Alkaline Phosphatase: 49 U/L (ref 39–117)
Anion gap: 22 — ABNORMAL HIGH (ref 5–15)
BUN: 7 mg/dL (ref 6–23)
CO2: 22 mEq/L (ref 19–32)
Calcium: 9.7 mg/dL (ref 8.4–10.5)
Chloride: 87 mEq/L — ABNORMAL LOW (ref 96–112)
Creatinine, Ser: 0.79 mg/dL (ref 0.50–1.35)
GFR calc Af Amer: >90 mL/min (ref >90)
GFR calc non Af Amer: >90 mL/min (ref >90)
Glucose, Bld: 83 mg/dL (ref 70–99)
Potassium: 4.7 mEq/L (ref 3.7–5.3)
Sodium: 131 mEq/L — ABNORMAL LOW (ref 137–147)
Total Bilirubin: 0.5 mg/dL (ref 0.3–1.2)
Total Protein: 9.5 g/dL — ABNORMAL HIGH (ref 6.0–8.3)

## 2014-05-31 LAB — LIPASE, BLOOD: Lipase: 203 U/L — ABNORMAL HIGH (ref 11–59)

## 2014-05-31 LAB — ACETAMINOPHEN LEVEL: Acetaminophen (Tylenol), Serum: <15 ug/mL (ref 10–30)

## 2014-05-31 LAB — SALICYLATE LEVEL: Salicylate Lvl: <2 mg/dL — ABNORMAL LOW (ref 2.8–20.0)

## 2014-05-31 LAB — ETHANOL
Alcohol, Ethyl (B): 398 mg/dL — ABNORMAL HIGH (ref 0–11)
Alcohol, Ethyl (B): 267 mg/dL — ABNORMAL HIGH (ref 0–11)

## 2014-05-31 MED ORDER — GI COCKTAIL ~~LOC~~
30.0000 mL | Freq: Once | ORAL | Status: AC
  Administered 2014-05-31: 30 mL via ORAL
  Filled 2014-05-31: qty 30

## 2014-05-31 MED ORDER — LORAZEPAM 1 MG PO TABS
0.0000 mg | ORAL_TABLET | Freq: Four times a day (QID) | ORAL | Status: DC
  Administered 2014-05-31: 1 mg via ORAL
  Filled 2014-05-31: qty 1

## 2014-05-31 MED ORDER — THIAMINE HCL 100 MG/ML IJ SOLN: 100.0000 mg | Freq: Every day | INTRAMUSCULAR | Status: DC

## 2014-05-31 MED ORDER — LORAZEPAM 1 MG PO TABS: 0.0000 mg | ORAL_TABLET | Freq: Two times a day (BID) | ORAL | Status: DC

## 2014-05-31 MED ORDER — VITAMIN B-1 100 MG PO TABS
100.0000 mg | ORAL_TABLET | Freq: Every day | ORAL | Status: DC
  Administered 2014-05-31: 100 mg via ORAL
  Filled 2014-05-31: qty 1

## 2014-05-31 MED ORDER — SODIUM CHLORIDE 0.9 % IV BOLUS (SEPSIS)
1000.0000 mL | INTRAVENOUS | Status: AC
  Administered 2014-05-31: 1000 mL via INTRAVENOUS

## 2014-05-31 MED ORDER — LORAZEPAM 1 MG PO TABS
1.0000 mg | ORAL_TABLET | Freq: Once | ORAL | Status: AC
  Administered 2014-05-31: 1 mg via ORAL
  Filled 2014-05-31: qty 1

## 2014-05-31 MED ORDER — IBUPROFEN 200 MG PO TABS
400.0000 mg | ORAL_TABLET | Freq: Once | ORAL | Status: AC
  Administered 2014-05-31: 400 mg via ORAL
  Filled 2014-05-31: qty 2

## 2014-05-31 NOTE — ED Notes (Signed)
Pt here for detox from ETOH, also c/o joint pain and abd pain. Denies n/v.

## 2014-05-31 NOTE — BH Assessment (Signed)
Tele Assessment Note   [name] [name] is an 53 y.o. male that presents to [facility] requesting detox from alcohol.  Gathered clinical information on the pt from [place] and scheduled pt's tele assessment with this clinician @ [phone].  Pt reports since he left [facility] 05/06/14, pt has been drinking 2 40 oz beers per day, last drank today before arriving to ED.  Pt stated he tried to follow up with [facility], but they turned him away.  Pt did not follow up with [facility] as recommended upon discharge from [facility].  Pt stated, "I need help.  I can't beat this on my own."  Pt denies use of any other substances.  Pt denies SI/HI or psychosis.  Pt denies current withdrawal sx.  He reports his current stressors are medical, having no primary support, and financial stress.  Pt stated he feels his medical issues are worsened by his alcohol use and he wants to stop drinking.  Pt is cooperative and motivated for treatment.  He is oriented x 4, despite his high alcohol level, has good eye contact, is pleasant, has normal speech and logical/coherent processes.  He does endorse sx of depression and anxiety and wants help to address this as well.  Consulted with Mertha Finders, NP, who accepted pt to [facility] Observation Unit to Dr. Dwyane Dee @ 1900.  Updated Letitia Libra, [facility], and [facility] staff.  Updated EDP Harrison @ [phone], who was in agreement with pt disposition.  Pt to come to Obs Unit after 9 PM.  Updated TTS staff.  Axis I: Alcohol Dependence, Depressive Disorder NOS, Generalized Anxiety Disorder Axis II: Deferred Axis III:  Past Medical History  Diagnosis Date  . Alcohol abuse   . Sarcoidosis of lung   . Reflux   . Depression   . Anxiety   . Pancreatitis   . Carpal tunnel syndrome of right wrist   . GERD (gastroesophageal reflux disease)    Axis IV: economic problems, housing problems, other psychosocial or environmental problems, problems with access to health care services and problems with primary support group Axis V: 21-30  behavior considerably influenced by delusions or hallucinations OR serious impairment in judgment, communication OR inability to function in almost all areas  Past Medical History:  Past Medical History  Diagnosis Date  . Alcohol abuse   . Sarcoidosis of lung   . Reflux   . Depression   . Anxiety   . Pancreatitis   . Carpal tunnel syndrome of right wrist   . GERD (gastroesophageal reflux disease)     Past Surgical History  Procedure Laterality Date  . Knee arthroscopy    . Carpel tunnel release    . Elbow surgery      Family History:  Family History  Problem Relation Age of Onset  . Diabetes Father   . Hypertension Father   . Hypertension Paternal Grandfather   . Alcohol abuse Paternal Grandfather   . Alcohol abuse Maternal Grandfather   . Alcohol abuse Maternal Grandmother   . Alcohol abuse Paternal Grandmother     Social History:  reports that he has quit smoking. His smoking use included Cigars. He does not have any smokeless tobacco history on file. He reports that he drinks alcohol. He reports that he does not use illicit drugs.  Additional Social History:  Alcohol / Drug Use Pain Medications: see med list Prescriptions: see med list Over the Counter: see med list History of alcohol / drug use?: Yes Longest period of  sobriety (when/how long): unknown Negative Consequences of Use: Financial;Personal relationships;Work / Youth worker Withdrawal Symptoms:  (none currently per pt) Substance #1 Name of Substance 1: ETOH 1 - Age of First Use: teens 1 - Amount (size/oz): 2 40 oz beers 1 - Frequency: daily 1 - Duration: ongoing 1 - Last Use / Amount: Today - 2 40 oz beers  CIWA: CIWA-Ar BP: 121/94 mmHg Pulse Rate: 100 Nausea and Vomiting: no nausea and no vomiting Tactile Disturbances: none Tremor: two Auditory Disturbances: not present Paroxysmal Sweats: no sweat visible Visual Disturbances: not present Anxiety: three Headache, Fullness in Head: mild Agitation:  somewhat more than normal activity Orientation and Clouding of Sensorium: oriented and can do serial additions CIWA-Ar Total: 8 COWS:    Allergies:  Allergies  Allergen Reactions  . Oxycodone Other (See Comments)    abd pain, stomach cramps     Home Medications:  (Not in a hospital admission)  OB/GYN Status:  No LMP for male patient.  General Assessment Data Location of Assessment: [facility] ED Is this a Tele or Face-to-Face Assessment?: Tele Assessment Is this an Initial Assessment or a Re-assessment for this encounter?: Initial Assessment Living Arrangements: Alone Can pt return to current living arrangement?: Yes Admission Status: Voluntary Is patient capable of signing voluntary admission?: Yes Transfer from: [facility] Referral Source: Self/Family/Friend     [place] Living Arrangements: Alone Name of Psychiatrist: None Name of Therapist: None  Education Status Is patient currently in school?: No  Risk to self Suicidal Ideation: No Suicidal Intent: No Is patient at risk for suicide?: No Suicidal Plan?: No Access to Means: No What has been your use of drugs/alcohol within the last 12 months?: Daily use of ETOH Previous Attempts/Gestures: No How many times?: 0 Other Self Harm Risks: pt denies Triggers for Past Attempts: None known Intentional Self Injurious Behavior: None Family Suicide History: Yes (Cousin committed suicide) Recent stressful life event(s): Recent negative physical changes;Other (Comment) (Medical, depression, financial, primary support) Persecutory voices/beliefs?: No Depression: Yes Depression Symptoms: Despondent;Loss of interest in usual pleasures;Feeling worthless/self pity Substance abuse history and/or treatment for substance abuse?: Yes Suicide prevention information given to non-admitted patients: Not applicable  Risk to Others Homicidal Ideation: No Thoughts of Harm to Others: No Current Homicidal Intent: No Current  Homicidal Plan: No Access to Homicidal Means: No Identified Victim: na - pt denies History of harm to others?: No Assessment of Violence: None Noted Violent Behavior Description: na - pt calm, cooperative Does patient have access to weapons?: No Criminal Charges Pending?: No Does patient have a court date: No  Psychosis Hallucinations: None noted Delusions: None noted  Mental Status Report Appear/Hygiene: Disheveled Eye Contact: Good Motor Activity: Freedom of movement;Unremarkable Speech: Logical/coherent Level of Consciousness: Alert Mood: Depressed Affect: Appropriate to circumstance Anxiety Level: Minimal Thought Processes: Coherent;Relevant Judgement: Impaired Orientation: Person;Place;Time;Situation Obsessive Compulsive Thoughts/Behaviors: None  Cognitive Functioning Concentration: Normal Memory: Recent Intact;Remote Intact IQ: Average Insight: Poor Impulse Control: Poor Appetite: Poor Weight Loss:  (12-13 lbs) Weight Gain: 0 Sleep: Decreased Total Hours of Sleep:  (reports only sleeps if drinks alcohol) Vegetative Symptoms: None  ADLScreening [facility] Assessment Services) Patient's cognitive ability adequate to safely complete daily activities?: Yes Patient able to express need for assistance with ADLs?: Yes Independently performs ADLs?: Yes (appropriate for developmental age)  Prior Inpatient Therapy Prior Inpatient Therapy: Yes Prior Therapy Dates: 2015, 2014, 2009, 2007 Prior Therapy Facilty/Provider(s): [facility] [place] Reason for Treatment: SA/Detox/Rehab  Prior Outpatient Therapy Prior Outpatient Therapy:  Yes Prior Therapy Dates: 2013-2014 Prior Therapy Facilty/Provider(s): [facility] Reason for Treatment: Depression/Anxiety  ADL Screening (condition at time of admission) Patient's cognitive ability adequate to safely complete daily activities?: Yes Is the patient deaf or have difficulty hearing?: No Does the  patient have difficulty seeing, even when wearing glasses/contacts?: No Does the patient have difficulty concentrating, remembering, or making decisions?: No Patient able to express need for assistance with ADLs?: Yes Does the patient have difficulty dressing or bathing?: No Independently performs ADLs?: Yes (appropriate for developmental age) Does the patient have difficulty walking or climbing stairs?: No  Home Assistive Devices/Equipment Home Assistive Devices/Equipment: None    Abuse/Neglect Assessment (Assessment to be complete while patient is alone) Physical Abuse: Denies Verbal Abuse: Denies Sexual Abuse: Denies Exploitation of patient/patient's resources: Denies Self-Neglect: Denies Values / Beliefs Cultural Requests During Hospitalization: None Spiritual Requests During Hospitalization: None Consults Spiritual Care Consult Needed: No Social Work Consult Needed: No Regulatory affairs officer (For Healthcare) Advance Directive: Patient does not have advance directive;Patient would not like information    Additional Information 1:1 In Past 12 Months?: No CIRT Risk: No Elopement Risk: No Does patient have medical clearance?: Yes     Disposition:  Disposition Initial Assessment Completed for this Encounter: Yes Disposition of Patient: Other dispositions Other disposition(s): Other (Comment) (Pt accepted to [facility] Observation Unit)  Shaune Pascal, [place], [facility] Licensed Professional Counselor Triage Specialist   05/31/2014 7:16 PM

## 2014-05-31 NOTE — ED Provider Notes (Signed)
CSN: [id number]     Arrival date & time 05/31/14  1509 History   First MD Initiated Contact with Patient 05/31/14 1545     Chief Complaint  Patient presents with  . detox from ETOH   . Joint Pain     (Consider location/radiation/quality/duration/timing/severity/associated sxs/prior Treatment) Patient is a 53 y.o. male presenting with abdominal pain. The history is provided by the patient.  Abdominal Pain Pain location:  Epigastric Pain quality: throbbing   Pain radiates to:  Does not radiate Pain severity:  Mild Onset quality:  Gradual Duration: months. Timing:  Constant Progression:  Unchanged Chronicity:  Chronic Context: alcohol use   Relieved by:  Nothing Worsened by:  Nothing tried Ineffective treatments:  None tried Associated symptoms: diarrhea   Associated symptoms: no chest pain, no cough, no dysuria, no fever, no hematuria, no nausea, no shortness of breath and no vomiting     Past Medical History  Diagnosis Date  . Alcohol abuse   . Sarcoidosis of lung   . Reflux   . Depression   . Anxiety   . Pancreatitis   . Carpal tunnel syndrome of right wrist   . GERD (gastroesophageal reflux disease)    Past Surgical History  Procedure Laterality Date  . Knee arthroscopy    . Carpel tunnel release    . Elbow surgery     Family History  Problem Relation Age of Onset  . Diabetes Father   . Hypertension Father   . Hypertension Paternal Grandfather   . Alcohol abuse Paternal Grandfather   . Alcohol abuse Maternal Grandfather   . Alcohol abuse Maternal Grandmother   . Alcohol abuse Paternal Grandmother    History  Substance Use Topics  . Smoking status: Former Smoker    Types: Cigars  . Smokeless tobacco: Not on file  . Alcohol Use: 0.0 oz/week     Comment: 3 1/2 40oz beers/daily last use today  last drink 1 week ago in [place]    Review of Systems  Constitutional: Positive for appetite change. Negative for fever and activity change.  HENT: Negative for  drooling and rhinorrhea.   Eyes: Negative for pain.  Respiratory: Negative for cough and shortness of breath.   Cardiovascular: Negative for chest pain and leg swelling.  Gastrointestinal: Positive for abdominal pain and diarrhea. Negative for nausea and vomiting.  Genitourinary: Negative for dysuria and hematuria.  Musculoskeletal: Positive for arthralgias. Negative for gait problem and neck pain.  Skin: Negative for color change.  Neurological: Positive for numbness (in feet). Negative for headaches.  Hematological: Negative for adenopathy.  Psychiatric/Behavioral: Negative for behavioral problems.  All other systems reviewed and are negative.     Allergies  Oxycodone  Home Medications   Prior to Admission medications   Medication Sig Start Date End Date Taking? Authorizing Provider  doxepin (SINEQUAN) 25 MG capsule Take 1 capsule (25 mg total) by mouth at bedtime as needed. for insomnia. 05/06/14   Benjamine Mola, FNP  escitalopram (LEXAPRO) 10 MG tablet Take 1 tablet (10 mg total) by mouth daily. 05/06/14   Benjamine Mola, FNP  gabapentin (NEURONTIN) 100 MG capsule Take 2 capsules (200 mg total) by mouth 2 (two) times daily. 05/06/14   Benjamine Mola, FNP  hydrOXYzine (ATARAX/VISTARIL) 25 MG tablet Take 1 tablet (25 mg total) by mouth every 6 (six) hours as needed for anxiety. 05/06/14   Benjamine Mola, FNP  Multiple Vitamin (MULTIVITAMIN WITH MINERALS) TABS tablet Take 1 tablet by mouth  daily. 05/06/14   Benjamine Mola, FNP  omeprazole (PRILOSEC) 20 MG capsule Take 20 mg twice daily for the next 10 days, then 20 mg once daily thereafter. 05/08/14   Veryl Speak, MD  pantoprazole (PROTONIX) 40 MG tablet Take 1 tablet (40 mg total) by mouth daily. 05/06/14   Benjamine Mola, FNP  vitamin B-12 (CYANOCOBALAMIN) 100 MCG tablet Take 1 tablet (100 mcg total) by mouth daily. 05/06/14   Elyse Jarvis Withrow, FNP   BP 121/94  Pulse 100  Temp(Src) 98.6 F (37 C) (Oral)  Resp 20  SpO2 96% Physical  Exam  Nursing note and vitals reviewed. Constitutional: He is oriented to person, place, and time. He appears well-developed and well-nourished.  HENT:  Head: Normocephalic and atraumatic.  Right Ear: External ear normal.  Left Ear: External ear normal.  Nose: Nose normal.  Mouth/Throat: Oropharynx is clear and moist. No oropharyngeal exudate.  Eyes: Conjunctivae and EOM are normal. Pupils are equal, round, and reactive to light.  Neck: Normal range of motion. Neck supple.  Cardiovascular: Normal rate, regular rhythm, normal heart sounds and intact distal pulses.  Exam reveals no gallop and no friction rub.   No murmur heard. Pulmonary/Chest: Effort normal and breath sounds normal. No respiratory distress. He has no wheezes.  Abdominal: Soft. Bowel sounds are normal. He exhibits no distension. There is tenderness (mild ttp of epig area). There is no rebound and no guarding.  Musculoskeletal: Normal range of motion. He exhibits no edema and no tenderness.  Normal-appearing knees and ankles bilaterally without focal tenderness. Normal range of motion diffusely. 2+ distal pulses in all extremities.  Neurological: He is alert and oriented to person, place, and time.  Skin: Skin is warm and dry.  Psychiatric: He has a normal mood and affect. His behavior is normal.    ED Course  Procedures (including critical care time) Labs Review Labs Reviewed  CBC - Abnormal; Notable for the following:    RDW 17.2 (*)    Platelets 95 (*)    All other components within normal limits  COMPREHENSIVE METABOLIC PANEL - Abnormal; Notable for the following:    Sodium 131 (*)    Chloride 87 (*)    Total Protein 9.5 (*)    AST 170 (*)    ALT 110 (*)    Anion gap 22 (*)    All other components within normal limits  ETHANOL - Abnormal; Notable for the following:    Alcohol, Ethyl (B) 398 (*)    All other components within normal limits  SALICYLATE LEVEL - Abnormal; Notable for the following:     Salicylate Lvl <0.0 (*)    All other components within normal limits  LIPASE, BLOOD - Abnormal; Notable for the following:    Lipase 203 (*)    All other components within normal limits  ETHANOL - Abnormal; Notable for the following:    Alcohol, Ethyl (B) 267 (*)    All other components within normal limits  ACETAMINOPHEN LEVEL  URINE RAPID DRUG SCREEN (HOSP PERFORMED)    Imaging Review No results found.   EKG Interpretation None      MDM   Final diagnoses:  Alcohol intoxication, uncomplicated    9:38 PM 53 y.o. male with a history of alcohol abuse, sarcoidosis, pancreatitis who presents with request for detox and several other chronic complaints. He states that he has chronic joint pains particularly worse in his knees which has been going on for months. He denies any  injuries or fever. He also notes some chronic numbness in his feet which has been going on for months as well and is unchanged. He complains of some chronic epigastric throbbing pain which is constant but worse when drinking alcohol. 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 He was seen last month for detox but states that he left after several days and has been drinking approximately two 40's per day. He last drink this morning and had a 40 ounce and a 16 ounce of beer prior to arrival. He is afebrile and Vital signs are unremarkable here. Will get screening labwork and TTS eval.   LFT's proximal to baseline. Lipase chronically elevated. Pt hungry and tolerating po. Medically cleared. He was accepted to [facility] by Dr. Dwyane Dee.   Blanchard Kelch, MD 06/01/14 [phone]

## 2014-05-31 NOTE — ED Notes (Signed)
PT from  Home c/o wanting ETOH detox and relief from joint pain. Pt states that he drinks approx " 2 forties a day but I'm tired of it and I need help." Pt rates 9/10 generalized aching mostly in the joints. Pt adds that he has not been eating lately. Pt is A&O and in NAD

## 2014-06-01 ENCOUNTER — Encounter (HOSPITAL_COMMUNITY): Payer: Self-pay | Admitting: *Deleted

## 2014-06-01 ENCOUNTER — Inpatient Hospital Stay (HOSPITAL_COMMUNITY)
Admission: AD | Admit: 2014-06-01 | Discharge: 2014-06-03 | DRG: 897 | Disposition: A | Discharge status: Home / Self Care | Payer: No Typology Code available for payment source | Source: Intra-hospital | Attending: Psychiatry | Admitting: Psychiatry

## 2014-06-01 DIAGNOSIS — Z8249 Family history of ischemic heart disease and other diseases of the circulatory system: Secondary | ICD-10-CM

## 2014-06-01 DIAGNOSIS — F10230 Alcohol dependence with withdrawal, uncomplicated: Secondary | ICD-10-CM

## 2014-06-01 DIAGNOSIS — Z87891 Personal history of nicotine dependence: Secondary | ICD-10-CM

## 2014-06-01 DIAGNOSIS — F410 Panic disorder [episodic paroxysmal anxiety] without agoraphobia: Secondary | ICD-10-CM | POA: Diagnosis present

## 2014-06-01 DIAGNOSIS — F321 Major depressive disorder, single episode, moderate: Secondary | ICD-10-CM | POA: Diagnosis present

## 2014-06-01 DIAGNOSIS — Z833 Family history of diabetes mellitus: Secondary | ICD-10-CM

## 2014-06-01 DIAGNOSIS — G4700 Insomnia, unspecified: Secondary | ICD-10-CM | POA: Diagnosis present

## 2014-06-01 DIAGNOSIS — K219 Gastro-esophageal reflux disease without esophagitis: Secondary | ICD-10-CM | POA: Diagnosis present

## 2014-06-01 DIAGNOSIS — F1094 Alcohol use, unspecified with alcohol-induced mood disorder: Secondary | ICD-10-CM

## 2014-06-01 DIAGNOSIS — F1020 Alcohol dependence, uncomplicated: Principal | ICD-10-CM | POA: Diagnosis present

## 2014-06-01 DIAGNOSIS — F411 Generalized anxiety disorder: Secondary | ICD-10-CM | POA: Diagnosis present

## 2014-06-01 MED ORDER — TRIAMCINOLONE ACETONIDE 0.025 % EX CREA
TOPICAL_CREAM | Freq: Two times a day (BID) | CUTANEOUS | Status: DC
  Administered 2014-06-02 – 2014-06-03 (×3): via TOPICAL
  Filled 2014-06-01 (×2): qty 15

## 2014-06-01 MED ORDER — NICOTINE 21 MG/24HR TD PT24
21.0000 mg | MEDICATED_PATCH | Freq: Every day | TRANSDERMAL | Status: DC
  Filled 2014-06-01 (×6): qty 1

## 2014-06-01 MED ORDER — LORAZEPAM 1 MG PO TABS
1.0000 mg | ORAL_TABLET | Freq: Four times a day (QID) | ORAL | Status: DC | PRN
  Administered 2014-06-01: 1 mg via ORAL
  Filled 2014-06-01 (×2): qty 1

## 2014-06-01 MED ORDER — PANTOPRAZOLE SODIUM 40 MG PO TBEC: 40.0000 mg | DELAYED_RELEASE_TABLET | Freq: Every day | ORAL | Status: DC

## 2014-06-01 MED ORDER — LORAZEPAM 1 MG PO TABS
1.0000 mg | ORAL_TABLET | Freq: Four times a day (QID) | ORAL | Status: AC
  Administered 2014-06-01 (×3): 1 mg via ORAL
  Filled 2014-06-01 (×2): qty 1

## 2014-06-01 MED ORDER — GABAPENTIN 100 MG PO CAPS
200.0000 mg | ORAL_CAPSULE | Freq: Two times a day (BID) | ORAL | Status: DC
  Filled 2014-06-01: qty 2

## 2014-06-01 MED ORDER — LORAZEPAM 1 MG PO TABS: 1.0000 mg | ORAL_TABLET | Freq: Four times a day (QID) | ORAL | Status: DC | PRN

## 2014-06-01 MED ORDER — LORAZEPAM 1 MG PO TABS
1.0000 mg | ORAL_TABLET | Freq: Three times a day (TID) | ORAL | Status: AC
  Administered 2014-06-02 (×3): 1 mg via ORAL
  Filled 2014-06-01 (×3): qty 1

## 2014-06-01 MED ORDER — LORAZEPAM 1 MG PO TABS: 0.0000 mg | ORAL_TABLET | Freq: Four times a day (QID) | ORAL | Status: DC

## 2014-06-01 MED ORDER — MAGNESIUM HYDROXIDE 400 MG/5ML PO SUSP: 30.0000 mL | Freq: Every day | ORAL | Status: DC | PRN

## 2014-06-01 MED ORDER — FOLIC ACID 1 MG PO TABS
1.0000 mg | ORAL_TABLET | Freq: Every day | ORAL | Status: DC
  Administered 2014-06-01 – 2014-06-03 (×3): 1 mg via ORAL
  Filled 2014-06-01 (×5): qty 1

## 2014-06-01 MED ORDER — ESCITALOPRAM OXALATE 10 MG PO TABS: 10.0000 mg | ORAL_TABLET | Freq: Every day | ORAL | Status: DC

## 2014-06-01 MED ORDER — HYDROXYZINE HCL 25 MG PO TABS: 25.0000 mg | ORAL_TABLET | Freq: Four times a day (QID) | ORAL | Status: DC | PRN

## 2014-06-01 MED ORDER — ADULT MULTIVITAMIN W/MINERALS CH: 1.0000 | ORAL_TABLET | Freq: Every day | ORAL | Status: DC

## 2014-06-01 MED ORDER — VITAMIN B-1 100 MG PO TABS
100.0000 mg | ORAL_TABLET | Freq: Every day | ORAL | Status: DC
  Administered 2014-06-01 – 2014-06-03 (×3): 100 mg via ORAL
  Filled 2014-06-01 (×6): qty 1

## 2014-06-01 MED ORDER — ESCITALOPRAM OXALATE 10 MG PO TABS
10.0000 mg | ORAL_TABLET | Freq: Every day | ORAL | Status: DC
  Administered 2014-06-01 – 2014-06-03 (×3): 10 mg via ORAL
  Filled 2014-06-01 (×3): qty 1
  Filled 2014-06-01: qty 14
  Filled 2014-06-01 (×2): qty 1

## 2014-06-01 MED ORDER — DOXEPIN HCL 25 MG PO CAPS
25.0000 mg | ORAL_CAPSULE | Freq: Every evening | ORAL | Status: DC | PRN
  Administered 2014-06-01: 25 mg via ORAL
  Filled 2014-06-01: qty 1
  Filled 2014-06-01: qty 14

## 2014-06-01 MED ORDER — ONDANSETRON HCL 4 MG PO TABS
4.0000 mg | ORAL_TABLET | Freq: Three times a day (TID) | ORAL | Status: DC | PRN
  Administered 2014-06-01: 4 mg via ORAL
  Filled 2014-06-01: qty 1

## 2014-06-01 MED ORDER — LORAZEPAM 1 MG PO TABS: 1.0000 mg | ORAL_TABLET | Freq: Every day | ORAL | Status: DC

## 2014-06-01 MED ORDER — GABAPENTIN 100 MG PO CAPS
200.0000 mg | ORAL_CAPSULE | Freq: Two times a day (BID) | ORAL | Status: DC
  Administered 2014-06-01 – 2014-06-03 (×5): 200 mg via ORAL
  Filled 2014-06-01 (×3): qty 2
  Filled 2014-06-01: qty 84
  Filled 2014-06-01 (×3): qty 2
  Filled 2014-06-01: qty 84
  Filled 2014-06-01 (×4): qty 2

## 2014-06-01 MED ORDER — VITAMIN B-12 100 MCG PO TABS
100.0000 ug | ORAL_TABLET | Freq: Every day | ORAL | Status: DC
  Administered 2014-06-01 – 2014-06-03 (×3): 100 ug via ORAL
  Filled 2014-06-01 (×4): qty 1
  Filled 2014-06-01: qty 5
  Filled 2014-06-01: qty 1

## 2014-06-01 MED ORDER — PANTOPRAZOLE SODIUM 40 MG PO TBEC
40.0000 mg | DELAYED_RELEASE_TABLET | Freq: Every day | ORAL | Status: DC
  Administered 2014-06-01 – 2014-06-03 (×3): 40 mg via ORAL
  Filled 2014-06-01 (×2): qty 1
  Filled 2014-06-01: qty 14
  Filled 2014-06-01 (×4): qty 1
  Filled 2014-06-01: qty 14

## 2014-06-01 MED ORDER — LORAZEPAM 2 MG/ML IJ SOLN: 1.0000 mg | Freq: Four times a day (QID) | INTRAMUSCULAR | Status: DC | PRN

## 2014-06-01 MED ORDER — LOPERAMIDE HCL 2 MG PO CAPS: 2.0000 mg | ORAL_CAPSULE | ORAL | Status: DC | PRN

## 2014-06-01 MED ORDER — LORAZEPAM 1 MG PO TABS
0.0000 mg | ORAL_TABLET | Freq: Four times a day (QID) | ORAL | Status: DC
  Administered 2014-06-01: 1 mg via ORAL
  Filled 2014-06-01: qty 1

## 2014-06-01 MED ORDER — ADULT MULTIVITAMIN W/MINERALS CH
1.0000 | ORAL_TABLET | Freq: Every day | ORAL | Status: DC
  Administered 2014-06-01 – 2014-06-03 (×3): 1 via ORAL
  Filled 2014-06-01: qty 1
  Filled 2014-06-01: qty 14
  Filled 2014-06-01 (×4): qty 1

## 2014-06-01 MED ORDER — VITAMIN B-1 100 MG PO TABS: 100.0000 mg | ORAL_TABLET | Freq: Every day | ORAL | Status: DC

## 2014-06-01 MED ORDER — THIAMINE HCL 100 MG/ML IJ SOLN
100.0000 mg | Freq: Every day | INTRAMUSCULAR | Status: DC
  Administered 2014-06-01: 100 mg via INTRAVENOUS
  Filled 2014-06-01: qty 2

## 2014-06-01 MED ORDER — GABAPENTIN 100 MG PO CAPS: 200.0000 mg | ORAL_CAPSULE | Freq: Three times a day (TID) | ORAL | Status: DC

## 2014-06-01 MED ORDER — ALUM & MAG HYDROXIDE-SIMETH 200-200-20 MG/5ML PO SUSP: 30.0000 mL | ORAL | Status: DC | PRN

## 2014-06-01 MED ORDER — HYDROXYZINE HCL 25 MG PO TABS
25.0000 mg | ORAL_TABLET | Freq: Four times a day (QID) | ORAL | Status: DC | PRN
  Administered 2014-06-01: 25 mg via ORAL
  Filled 2014-06-01: qty 1

## 2014-06-01 MED ORDER — LORAZEPAM 1 MG PO TABS: 0.0000 mg | ORAL_TABLET | Freq: Two times a day (BID) | ORAL | Status: DC

## 2014-06-01 MED ORDER — ENSURE COMPLETE PO LIQD
1530.0000 mL | Freq: Two times a day (BID) | ORAL | Status: DC
  Administered 2014-06-01 (×2): 237 mL via ORAL
  Filled 2014-06-01 (×4): qty 1659

## 2014-06-01 MED ORDER — VITAMIN C 250 MG PO TABS
250.0000 mg | ORAL_TABLET | Freq: Every day | ORAL | Status: DC
  Administered 2014-06-01 – 2014-06-03 (×3): 250 mg via ORAL
  Filled 2014-06-01 (×6): qty 1

## 2014-06-01 MED ORDER — DOXEPIN HCL 25 MG PO CAPS
25.0000 mg | ORAL_CAPSULE | Freq: Every evening | ORAL | Status: DC | PRN
  Filled 2014-06-01: qty 1

## 2014-06-01 MED ORDER — LORAZEPAM 1 MG PO TABS
1.0000 mg | ORAL_TABLET | Freq: Two times a day (BID) | ORAL | Status: DC
Start: 2014-06-03 — End: 2014-06-03
  Administered 2014-06-03: 1 mg via ORAL
  Filled 2014-06-01: qty 1

## 2014-06-01 NOTE — BHH Suicide Risk Assessment (Signed)
[place] INPATIENT: Family/Significant Other Suicide Prevention Education  Suicide Prevention Education:  Education Completed; No one has been identified by the patient as the family member/significant other with whom the patient will be residing, and identified as the person(s) who will aid the patient in the event of a mental health crisis (suicidal ideations/suicide attempt).   Pt did not c/o SI at admission, nor have they endorsed SI during their stay here. SPE not required. Pt provided with SPI pamphlet and encouraged to share information with support network, ask questions, and talk about any concerns relating to SPE.  [place], LCSWA 06/01/2014 11:12 AM

## 2014-06-01 NOTE — Progress Notes (Signed)
NUTRITION ASSESSMENT  Pt identified as at risk on the Malnutrition Screen Tool  INTERVENTION: 1. Educated patient on the importance of nutrition and encouraged intake of food and beverages. 2. Educated pt on general healthy eating and provided handouts of this information. 3. Supplements: Ensure Complete BID   NUTRITION DIAGNOSIS: Unintentional weight loss related to sub-optimal intake as evidenced by pt report.   Goal: Pt to meet >/= 90% of their estimated nutrition needs.  Monitor:  PO intake  Assessment:  Admitted for alcohol detox.   - Pt reports poor appetite for a few weeks, sometimes going for a few days not eating anything - Lost 10 pounds in the past 2 months per pt - Has tried Ensure in the past, willing to get some during admission - Had 1 episode of diarrhea and vomiting today - States his nausea is getting better  53 y.o. male  Height: Ht Readings from Last 1 Encounters:  06/01/14 5\' 9"  (1.753 m)    Weight: Wt Readings from Last 1 Encounters:  06/01/14 136 lb (61.689 kg)    Weight Hx: Wt Readings from Last 10 Encounters:  06/01/14 136 lb (61.689 kg)  05/08/14 143 lb (64.864 kg)  05/05/14 131 lb 8 oz (59.648 kg)  03/16/14 143 lb (64.864 kg)  02/18/14 145 lb 8.1 oz (66 kg)  01/19/14 141 lb (63.957 kg)  01/18/14 146 lb 6 oz (66.395 kg)  09/01/13 135 lb (61.236 kg)  09/01/13 140 lb (63.504 kg)  07/10/13 140 lb (63.504 kg)    BMI:  Body mass index is 20.07 kg/(m^2). Pt meets criteria for normal weight based on current BMI.  Estimated Nutritional Needs: Kcal: 25-30 kcal/kg Protein: > 1 gram protein/kg Fluid: 1 ml/kcal  Diet Order: General Pt is also offered choice of unit snacks mid-morning and mid-afternoon.  Pt is eating as desired.   Lab results and medications reviewed. Lipase and AST/ALT elevated. Getting folic acid, thiamine, vitamin C, vitamin B-12, and daily multivitamin.   Carlis Stable MS, [place], LDN [phone] Pager [phone]  Weekend/After Hours Pager

## 2014-06-01 NOTE — Progress Notes (Signed)
[place] INPATIENT:  Family/Significant Other Suicide Prevention Education  Suicide Prevention Education:  Patient Refusal for Family/Significant Other Suicide Prevention Education: The patient [name] [name] has refused to provide written consent for family/significant other to be provided Family/Significant Other Suicide Prevention Education during admission and/or prior to discharge.  Physician notified.  Loyal Gambler B 06/01/2014, 3:52 AM

## 2014-06-01 NOTE — Progress Notes (Signed)
Patient ID: [name] [name], male   DOB: 01/09/1961, 53 y.o.   MRN: [id number] D: Pt. In bed most of the evening, up to BR and sits on side of bed. Pt. Tremulous, assertive, pleasant. A: Writer introduced self to client, reviewed medications, encouraged group. Staff will monitor q46min for safety. R: Pt. Is safe on the unit, did not attend group.

## 2014-06-01 NOTE — Plan of Care (Signed)
[place] Observation Crisis Plan  Reason for Crisis Plan:  Substance Abuse   Plan of Care:  Referral for Telepsychiatry/Psychiatric Consult  Family Support:      Current Living Environment:  Living Arrangements: Alone  Insurance:   Hospital Account   Name Acct ID Class Status Primary Coverage   [name] [name], [name] [name] [id number] [place] Open None        Guarantor Account (for Hospital Account [id number])   Name Relation to Pt Service Area Active? Acct Type   [name] [name], [name] [name] Self [facility] Yes Behavioral Health   Address Phone       [address] [place], [place] [address] [phone])          Coverage Information (for Hospital Account [id number])   Not on file      Legal Guardian:   SELF  Primary Care Provider:  Lucretia [name]., DO  Current Outpatient Providers:  [name] [name]  Psychiatrist:    NONE  Counselor/Therapist:   NONE  Compliant with Medications:  Yes  Additional Information:   [name] [name] 7/6/20153:46 AM

## 2014-06-01 NOTE — BHH Counselor (Signed)
Adult Psychosocial Assessment Update Interdisciplinary Team  Previous [facility] admissions/discharges:  Admissions Discharges  Date: 03/16/14 Date: 03/20/14  Date: 01/19/14 Date: 01/23/14  Date: 09/01/13 Date: 09/05/13  Date: 02/02/08 Date: x  Date: Date:   Changes since the last Psychosocial Assessment (including adherence to outpatient mental health and/or substance abuse treatment, situational issues contributing to decompensation and/or relapse). [name] [name] is an 53 y.o. male that presents to [facility] requesting ETOH Detox,  Pt reports since he left [facility] Obs Unit 05/06/14, pt has been drinking 2 40 oz beers per day, last drank today before arriving to ED. Pt stated he tried to follow up with [facility], but they turned him away. Pt did not follow up with [facility] as recommended upon discharge from [facility]. Pt stated, "I need help. I can't beat this on my own." Pt denies use of any other substances. Pt denies SI/HI or psychosis. Pt denies current withdrawal sx. He reports his current stressors are medical, having no primary support, and financial stress. Pt stated he feels his medical issues are worsened by his alcohol use and he wants to stop drinking. Pt is cooperative and motivated for treatment. He is oriented x 4, despite his high alcohol level, has good eye contact, is pleasant, has normal speech and logical/coherent processes. He does endorse sx of depression and anxiety and wants help to address this as well.             Discharge Plan 1. Will you be returning to the same living situation after discharge?   Yes: No:      If no, what is your plan?    Pt hoping for inpatient treatment. CSW assessing.        2. Would you like a referral for services when you are discharged? Yes:     If yes, for what services?  No:       CSW assessing for appropriate referrals at this time. [facility] is option. [facility] for med management.        Summary and Recommendations (to be completed by the  evaluator) Pt is 53 year old male living in [place], [place] ([place] Pt presents to [facility] for ETOH detox, mood stabilization, and medication management. Pt denies SI/HI/AVH upon admission. Recommendations for pt include: crisis stablization, therapeoutic milieu, encourage group attendance and participation, ativan taper for withdrawals, medication management for mood stabilization, and development of comprehensive mental wellness/sobriety plan. Pt hoping for admission to treatment center. CSW assessing for appropriate referrals at this time.                        Signature:  Smart, Alicia Amel  06/01/2014 11:32 AM

## 2014-06-01 NOTE — Plan of Care (Signed)
Problem: Alteration in mood & ability to function due to Goal: LTG-Pt reports reduction in suicidal thoughts (Patient reports reduction in suicidal thoughts and is able to verbalize a safety plan for whenever patient is feeling suicidal)  Outcome: Not Applicable Date Met:  00/51/10 Pt denies SI

## 2014-06-01 NOTE — Progress Notes (Signed)
D: Pt admitted to the unit from [facility] requesting alcohol detox. Patient denied SI, AH/VH.  Pt. Reported a constant knee pain of 9/10. Pt. States that the knee pain is as a result of too much alcohol in his system. Patient was cooperative during the admission process but constantly reminding the writer that he is shaking and nervous. Pt also requesting for medication to keep him relaxed. Pt was seen hiding money in his pocket. When asked to bring out what he was hiding, pt states "It's my money. It's a huge sum like $500-$600. The writer asked the pt to bring it so that we count it together and lock it in a safe. The money was counted in the presence of two other staff, the patient and the security and was $322.00. It was sealed in an envelopes and put ina  Safe.   A: offered support and encouragement to patient. Medication given as ordered. Safety maintained by initiated a 15 minutes per protocol.   R: Patient receptive. Complied with medication. Will continue to monitor patient.

## 2014-06-01 NOTE — Progress Notes (Signed)
Pt. Up for breakfast. No c/o at this time.

## 2014-06-01 NOTE — BHH Suicide Risk Assessment (Signed)
Suicide Risk Assessment  Admission Assessment     Nursing information obtained from:    Demographic factors:    Current Mental Status:    Loss Factors:    Historical Factors:    Risk Reduction Factors:    Total Time spent with patient: 45 minutes  CLINICAL FACTORS:   Alcohol/Substance Abuse/Dependencies  Psychiatric Specialty Exam:     Blood pressure 114/85, pulse 138, temperature 99.4 F (37.4 C), temperature source Oral, resp. rate 20, height 5\' 9"  (1.753 m), weight 61.689 kg (136 lb), SpO2 98.00%.Body mass index is 20.07 kg/(m^2).  General Appearance: Disheveled  Eye Contact::  Minimal  Speech:  Clear and Coherent and not spontaneous  Volume:  Decreased  Mood:  Anxious and Depressed  Affect:  Restricted  Thought Process:  Coherent and Goal Directed  Orientation:  Full (Time, Place, and Person)  Thought Content:  symtpoms worries concerns shame and guilt for the relapse  Suicidal Thoughts:  No  Homicidal Thoughts:  No  Memory:  Immediate;   Fair Recent;   Fair Remote;   Fair  Judgement:  Fair  Insight:  Present and Shallow  Psychomotor Activity:  Restlessness  Concentration:  Fair  Recall:  [employer] of Knowledge:NA  Language: Fair  Akathisia:  No  Handed:    AIMS (if indicated):     Assets:  Desire for Improvement  Sleep:      Musculoskeletal: Strength & Muscle Tone: within normal limits Gait & Station: normal Patient leans: N/A  COGNITIVE FEATURES THAT CONTRIBUTE TO RISK:  Closed-mindedness Polarized thinking Thought constriction (tunnel vision)    SUICIDE RISK:   Moderate:  Frequent suicidal ideation with limited intensity, and duration, some specificity in terms of plans, no associated intent, good self-control, limited dysphoria/symptomatology, some risk factors present, and identifiable protective factors, including available and accessible social support.  PLAN OF CARE: Supportive approach/coping skills/relapse prevention       Detox with Ativan                               Reassess and address the co morbidities  I certify that inpatient services furnished can reasonably be expected to improve the patient's condition.  LUGO,IRVING A 06/01/2014, 5:46 PM

## 2014-06-01 NOTE — Plan of Care (Signed)
Problem: Ineffective individual coping Goal: STG: Patient will remain free from self harm Outcome: Progressing Pt denies SI, contracts for safey

## 2014-06-01 NOTE — BHH Group Notes (Signed)
[facility] LCSW Group Therapy  06/01/2014 2:51 PM  Type of Therapy:  Group Therapy  Participation Level:  Did Not Attend-pt in room resting   Smart, Heather LCSWA  06/01/2014, 2:51 PM

## 2014-06-01 NOTE — H&P (Signed)
Psychiatric Admission Assessment Adult  Patient Identification:  [name] [name]  Date of Evaluation:  06/01/2014  Chief Complaint:  ETOH USE DISORDER,SEV  History of Present Illness: [name] [name] is 53 years old, African-American male. He reports, "I was dropped off at the hospital by a friend because I was shaking real bad. Then, my leg started to hurt, I felt some numbness. I got scared. My alcohol drinking worsened over 3 weeks ago, but I have been battling alcoholism x 20 years. I had been through some residential substance abuse treatments. I drink to help me from having panic attacks. I use alcohol to self-medicate. I don't sleep at night. Alcohol helps me sleep. I have bad anxiety, but ain't depressed or suicidal. I had been to [facility]".  Elements:  Location:  Alcoholism. Quality:  Tremors, cravings. Severity:  Severe, "I can't quit drinking on my own". Timing:  "My drinking worsened over 3 weeks ago". Duration:  Chronic. Context:  "I drink to help my panic attacks..  Associated Signs/Synptoms:  Depression Symptoms:  insomnia, anxiety,  (Hypo) Manic Symptoms:  Impulsivity,  Anxiety Symptoms:  Excessive Worry,  Psychotic Symptoms:  Denies  PTSD Symptoms: NA  Psychiatric Specialty Exam: Physical Exam  Constitutional: He is oriented to person, place, and time. He appears well-developed.  HENT:  Head: Normocephalic.  Eyes: Pupils are equal, round, and reactive to light.  Neck: Normal range of motion.  Cardiovascular: Normal rate.   Respiratory: Effort normal.  GI: Soft.  Genitourinary:  Denies any issues in this area  Musculoskeletal: Normal range of motion.  Neurological: He is alert and oriented to person, place, and time.  Skin: Skin is warm and dry.  Psychiatric: His speech is normal and behavior is normal. Thought content normal. His mood appears anxious. Cognition and memory are normal. He expresses impulsivity. He exhibits a depressed  mood.    Review of Systems  Constitutional: Positive for chills and malaise/fatigue.  HENT: Negative.   Eyes: Negative.   Respiratory: Negative.   Cardiovascular: Negative.   Gastrointestinal: Negative.   Genitourinary: Negative.   Musculoskeletal: Negative.   Skin: Negative.   Neurological: Positive for dizziness, tremors and weakness.  Endo/Heme/Allergies: Negative.   Psychiatric/Behavioral: Positive for depression and substance abuse (Alcoholism, chronic). Negative for suicidal ideas, hallucinations and memory loss. The patient is nervous/anxious.     Blood pressure 113/79, pulse 118, temperature 99.4 F (37.4 C), temperature source Oral, resp. rate 20, height '5\' 9"'  (1.753 m), weight 61.689 kg (136 lb), SpO2 98.00%.Body mass index is 20.07 kg/(m^2).  General Appearance: Disheveled  Eye Sport and exercise psychologist::  Fair  Speech:  Clear and Coherent  Volume:  Normal  Mood:  Anxious  Affect:  Restricted  Thought Process:  Circumstantial and Tangential  Orientation:  Full (Time, Place, and Person)  Thought Content:  Rumination  Suicidal Thoughts:  No  Homicidal Thoughts:  No  Memory:  Immediate;   Fair Recent;   Fair Remote;   Fair  Judgement:  Impaired  Insight:  Present  Psychomotor Activity:  Tremor  Concentration:  Fair  Recall:  [employer] of Knowledge:Poor  Language: Fair  Akathisia:  No  Handed:  Right  AIMS (if indicated):     Assets:  Desire for Improvement  Sleep:      Musculoskeletal: Strength & Muscle Tone: within normal limits Gait & Station: normal Patient leans: N/A  Past Psychiatric History: Diagnosis: Alcohol dependence, GAD  Hospitalizations: [facility] adult unit x multiple times  Outpatient Care:  [facility]  Substance Abuse Care: [facility]  Self-Mutilation: Denies  Suicidal Attempts: Denies  Violent Behaviors: Denies   Past Medical History:   Past Medical History  Diagnosis Date  . Alcohol abuse   .  Sarcoidosis of lung   . Reflux   . Depression   . Anxiety   . Pancreatitis   . Carpal tunnel syndrome of right wrist   . GERD (gastroesophageal reflux disease)    None.  Allergies:   Allergies  Allergen Reactions  . Oxycodone Other (See Comments)    abd pain, stomach cramps    PTA Medications: Prescriptions prior to admission  Medication Sig Dispense Refill  . doxepin (SINEQUAN) 25 MG capsule Take 1 capsule (25 mg total) by mouth at bedtime as needed. for insomnia.  30 capsule  0  . escitalopram (LEXAPRO) 10 MG tablet Take 1 tablet (10 mg total) by mouth daily.  30 tablet  0  . gabapentin (NEURONTIN) 100 MG capsule Take 2 capsules (200 mg total) by mouth 2 (two) times daily.  120 capsule  0  . hydrOXYzine (ATARAX/VISTARIL) 25 MG tablet Take 1 tablet (25 mg total) by mouth every 6 (six) hours as needed for anxiety.  30 tablet  0  . Multiple Vitamin (MULTIVITAMIN WITH MINERALS) TABS tablet Take 1 tablet by mouth daily.  30 tablet  0  . omeprazole (PRILOSEC) 20 MG capsule Take 20 mg twice daily for the next 10 days, then 20 mg once daily thereafter.  50 capsule  0  . pantoprazole (PROTONIX) 40 MG tablet Take 1 tablet (40 mg total) by mouth daily.  30 tablet  0  . vitamin B-12 (CYANOCOBALAMIN) 100 MCG tablet Take 1 tablet (100 mcg total) by mouth daily.  30 tablet  0    Previous Psychotropic Medications:  Medication/Dose  See medication lists               Substance Abuse History in the last 12 months:  Yes.    Consequences of Substance Abuse: Medical Consequences:  Liver damage, Possible death by overdose Legal Consequences:  Arrests, jail time, Loss of driving privilege. Family Consequences:  Family discord, divorce and or separation.  Social History:  reports that he has quit smoking. His smoking use included Cigars. He does not have any smokeless tobacco history on file. He reports that he drinks alcohol. He reports that he does not use illicit drugs. Additional  Social History: Current Place of Residence:  [place], [place] of Birth: [place], [place]   Family Members: None reported  Marital Status:  Separated  Children: 2  Sons: 2  Daughters: 0  Relationships: Separated  Education:  [employer] Charity fundraiser Problems/Performance: Completed high school  Religious Beliefs/Practices: NA  History of Abuse (Emotional/Phsycial/Sexual): Denies  Occupational Experiences: Medical laboratory scientific officer History:  None.  Legal History: Denies  Hobbies/Interests: NA  Family History:   Family History  Problem Relation Age of Onset  . Diabetes Father   . Hypertension Father   . Hypertension Paternal Grandfather   . Alcohol abuse Paternal Grandfather   . Alcohol abuse Maternal Grandfather   . Alcohol abuse Maternal Grandmother   . Alcohol abuse Paternal Grandmother     Results for orders placed during the hospital encounter of 05/31/14 (from the past 72 hour(s))  ACETAMINOPHEN LEVEL     Status: None   Collection Time    05/31/14  3:53 PM      Result  Value Ref Range   Acetaminophen (Tylenol), Serum <15.0  10 - 30 ug/mL   Comment:            THERAPEUTIC CONCENTRATIONS VARY     SIGNIFICANTLY. A RANGE OF 10-30     ug/mL MAY BE AN EFFECTIVE     CONCENTRATION FOR MANY PATIENTS.     HOWEVER, SOME ARE BEST TREATED     AT CONCENTRATIONS OUTSIDE THIS     RANGE.     ACETAMINOPHEN CONCENTRATIONS     >150 ug/mL AT 4 HOURS AFTER     INGESTION AND >50 ug/mL AT 12     HOURS AFTER INGESTION ARE     OFTEN ASSOCIATED WITH TOXIC     REACTIONS.  CBC     Status: Abnormal   Collection Time    05/31/14  3:53 PM      Result Value Ref Range   WBC 4.8  4.0 - 10.5 K/uL   RBC 5.14  4.22 - 5.81 MIL/uL   Hemoglobin 13.6  13.0 - 17.0 g/dL   HCT 40.6  39.0 - 52.0 %   MCV 79.0  78.0 - 100.0 fL   MCH 26.5  26.0 - 34.0 pg   MCHC 33.5  30.0 - 36.0 g/dL   RDW 17.2 (*) 11.5 - 15.5 %   Platelets 95 (*) 150 - 400 K/uL   Comment: RESULT REPEATED AND VERIFIED      SPECIMEN CHECKED FOR CLOTS     PLATELET COUNT CONFIRMED BY SMEAR  COMPREHENSIVE METABOLIC PANEL     Status: Abnormal   Collection Time    05/31/14  3:53 PM      Result Value Ref Range   Sodium 131 (*) 137 - 147 mEq/L   Potassium 4.7  3.7 - 5.3 mEq/L   Chloride 87 (*) 96 - 112 mEq/L   CO2 22  19 - 32 mEq/L   Glucose, Bld 83  70 - 99 mg/dL   BUN 7  6 - 23 mg/dL   Creatinine, Ser 0.79  0.50 - 1.35 mg/dL   Calcium 9.7  8.4 - 10.5 mg/dL   Total Protein 9.5 (*) 6.0 - 8.3 g/dL   Albumin 4.3  3.5 - 5.2 g/dL   AST 170 (*) 0 - 37 U/L   ALT 110 (*) 0 - 53 U/L   Alkaline Phosphatase 49  39 - 117 U/L   Total Bilirubin 0.5  0.3 - 1.2 mg/dL   GFR calc non Af Amer >90  >90 mL/min   GFR calc Af Amer >90  >90 mL/min   Comment: (NOTE)     The eGFR has been calculated using the CKD EPI equation.     This calculation has not been validated in all clinical situations.     eGFR's persistently <90 mL/min signify possible Chronic Kidney     Disease.   Anion gap 22 (*) 5 - 15  ETHANOL     Status: Abnormal   Collection Time    05/31/14  3:53 PM      Result Value Ref Range   Alcohol, Ethyl (B) 398 (*) 0 - 11 mg/dL   Comment:            LOWEST DETECTABLE LIMIT FOR     SERUM ALCOHOL IS 11 mg/dL     FOR MEDICAL PURPOSES ONLY  SALICYLATE LEVEL     Status: Abnormal   Collection Time    05/31/14  3:53 PM      Result  Value Ref Range   Salicylate Lvl <4.0 (*) 2.8 - 20.0 mg/dL  LIPASE, BLOOD     Status: Abnormal   Collection Time    05/31/14  4:05 PM      Result Value Ref Range   Lipase 203 (*) 11 - 59 U/L  URINE RAPID DRUG SCREEN (HOSP PERFORMED)     Status: None   Collection Time    05/31/14  4:34 PM      Result Value Ref Range   Opiates NONE DETECTED  NONE DETECTED   Cocaine NONE DETECTED  NONE DETECTED   Benzodiazepines NONE DETECTED  NONE DETECTED   Amphetamines NONE DETECTED  NONE DETECTED   Tetrahydrocannabinol NONE DETECTED  NONE DETECTED   Barbiturates NONE DETECTED  NONE DETECTED    Comment:            DRUG SCREEN FOR MEDICAL PURPOSES     ONLY.  IF CONFIRMATION IS NEEDED     FOR ANY PURPOSE, NOTIFY LAB     WITHIN 5 DAYS.                LOWEST DETECTABLE LIMITS     FOR URINE DRUG SCREEN     Drug Class       Cutoff (ng/mL)     Amphetamine      1000     Barbiturate      200     Benzodiazepine   981     Tricyclics       191     Opiates          300     Cocaine          300     THC              50  ETHANOL     Status: Abnormal   Collection Time    05/31/14  9:26 PM      Result Value Ref Range   Alcohol, Ethyl (B) 267 (*) 0 - 11 mg/dL   Comment:            LOWEST DETECTABLE LIMIT FOR     SERUM ALCOHOL IS 11 mg/dL     FOR MEDICAL PURPOSES ONLY   Psychological Evaluations:  Assessment:   DSM5:  Schizophrenia Disorders:  NA Obsessive-Compulsive Disorders:  NA Trauma-Stressor Disorders:  NA Substance/Addictive Disorders:  Alcohol Intoxication with Use Disorder - Severe (F10.229) Depressive Disorders:  Generalized anxiety disorder  AXIS I:  Alcohol dependence, GAD AXIS II:  Deferred AXIS III:   Past Medical History  Diagnosis Date  . Alcohol abuse   . Sarcoidosis of lung   . Reflux   . Depression   . Anxiety   . Pancreatitis   . Carpal tunnel syndrome of right wrist   . GERD (gastroesophageal reflux disease)    AXIS IV:  other psychosocial or environmental problems and Alcoholism, chronic AXIS V:  41-50 serious symptoms  Treatment Plan/Recommendations: 1. Admit for crisis management and stabilization, estimated length of stay 3-5 days.  2. Medication management to reduce current symptoms to base line and improve the patient's overall level of functioning; continue current treatment plan in progress. 3. Treat health problems as indicated.  4. Develop treatment plan to decrease risk of relapse upon discharge and the need for readmission.  5. Psycho-social education regarding relapse prevention and self care.  6. Health care follow up as needed for  medical problems.  7. Review, reconcile, and reinstate any pertinent home  medications for other health issues where appropriate. 8. Call for consults with hospitalist for any additional specialty patient care services as needed.  Treatment Plan Summary: Daily contact with patient to assess and evaluate symptoms and progress in treatment Medication management  Current Medications:  Current Facility-Administered Medications  Medication Dose Route Frequency Provider Last Rate Last Dose  . alum & mag hydroxide-simeth (MAALOX/MYLANTA) 200-200-20 MG/5ML suspension 30 mL  30 mL Oral Q4H PRN Lurena Nida, NP      . doxepin (SINEQUAN) capsule 25 mg  25 mg Oral QHS PRN Lurena Nida, NP   25 mg at 06/01/14 7035  . escitalopram (LEXAPRO) tablet 10 mg  10 mg Oral Daily Lurena Nida, NP   10 mg at 06/01/14 0851  . folic acid (FOLVITE) tablet 1 mg  1 mg Oral Daily Waylan Boga, NP   1 mg at 06/01/14 0905  . gabapentin (NEURONTIN) capsule 200 mg  200 mg Oral BID Lurena Nida, NP   200 mg at 06/01/14 0851  . hydrOXYzine (ATARAX/VISTARIL) tablet 25 mg  25 mg Oral Q6H PRN Lurena Nida, NP   25 mg at 06/01/14 1321  . loperamide (IMODIUM) capsule 2-4 mg  2-4 mg Oral PRN Nicholaus Bloom, MD      . LORazepam (ATIVAN) tablet 1 mg  1 mg Oral Q6H PRN Nicholaus Bloom, MD   1 mg at 06/01/14 1041  . LORazepam (ATIVAN) tablet 1 mg  1 mg Oral QID Nicholaus Bloom, MD   1 mg at 06/01/14 1148   Followed by  . [START ON 06/02/2014] LORazepam (ATIVAN) tablet 1 mg  1 mg Oral TID Nicholaus Bloom, MD       Followed by  . [START ON 06/03/2014] LORazepam (ATIVAN) tablet 1 mg  1 mg Oral BID Nicholaus Bloom, MD       Followed by  . [START ON 06/04/2014] LORazepam (ATIVAN) tablet 1 mg  1 mg Oral Daily Nicholaus Bloom, MD      . magnesium hydroxide (MILK OF MAGNESIA) suspension 30 mL  30 mL Oral Daily PRN Lurena Nida, NP      . multivitamin with minerals tablet 1 tablet  1 tablet Oral Daily Lurena Nida, NP   1 tablet at 06/01/14 0851  .  nicotine (NICODERM CQ - dosed in mg/24 hours) patch 21 mg  21 mg Transdermal Daily Waylan Boga, NP      . ondansetron (ZOFRAN) tablet 4 mg  4 mg Oral Q8H PRN Waylan Boga, NP   4 mg at 06/01/14 1321  . pantoprazole (PROTONIX) EC tablet 40 mg  40 mg Oral Daily Lurena Nida, NP   40 mg at 06/01/14 0851  . thiamine (VITAMIN B-1) tablet 100 mg  100 mg Oral Daily Waylan Boga, NP   100 mg at 06/01/14 0851  . triamcinolone (KENALOG) 0.025 % cream   Topical BID Waylan Boga, NP      . vitamin B-12 (CYANOCOBALAMIN) tablet 100 mcg  100 mcg Oral Daily Waylan Boga, NP   100 mcg at 06/01/14 0851  . vitamin C (ASCORBIC ACID) tablet 250 mg  250 mg Oral Daily Waylan Boga, NP   250 mg at 06/01/14 0093    Observation Level/Precautions:  15 minute checks  Laboratory:  Per ED  Psychotherapy: Group sessions, AA/NA meetings    Medications:  See medication lists  Consultations:  As needed  Discharge Concerns:  Maintaining sobriety  Estimated LOS:  2-4 days  Other:     I certify that inpatient services furnished can reasonably be expected to improve the patient's condition.   Encarnacion Slates, [place] 7/6/20152:36 PM  I personally assessed the patient, reviewed the physical exam and labs and formulated the treatment plan Geralyn Flash A. [place], [place].D

## 2014-06-01 NOTE — Progress Notes (Signed)
Pt admitted to [facility] adult unit, [address] hall for ETOH detox--pt denies SI/HI/AVH, CIWA 14 currently, pt has severe tremor, c/o agitation/anxiety, nausea, pt states that "this is the worst it has ever been" in reference to his withdrawal s/s, pt denies withdrawal seizure history,  Pt cooperative and pleasant during admission.

## 2014-06-01 NOTE — Progress Notes (Signed)
D) Pt. Is currently resting in bed, with significant shaking noted. Appears unable to relax, but is trying to rest.   Pt. Reports nausea, one episode of diarrhea, and one episode of vomiting since admission.  Pt. Also reports some head pressure. Pt. Asking about available medications.  Pt. Very polite and pleasant. Reports he has mother and siblings in the area for support.  Pt. States 2 sons and their mother live in [place].  A) Pt. Given Ensure per order, offered comfort measures (extra pillow and extra blanket).  Pt. Reassured medication would be given as ordered and as soon as available per order.  R) Pt. Continues on q 15 min observations and remains safe at this time.

## 2014-06-02 MED ORDER — ENSURE COMPLETE PO LIQD
237.0000 mL | Freq: Two times a day (BID) | ORAL | Status: DC
  Administered 2014-06-02 – 2014-06-03 (×2): 237 mL via ORAL

## 2014-06-02 NOTE — Progress Notes (Signed)
Recreation Therapy Notes  Animal-Assisted Activity/Therapy (AAA/T) Program Checklist/Progress Notes Patient Eligibility Criteria Checklist & Daily Group note for Rec Tx Intervention  Date: 07.07.2015 Time: 2:45pm Location: [address]   AAA/T Program Assumption of Risk Form signed by Patient/ or Parent Legal Guardian yes  Patient is free of allergies or sever asthma yes  Patient reports no fear of animals yes  Patient reports no history of cruelty to animals yes   Patient understands his/her participation is voluntary yes  Behavioral Response: Did not attend.   Laureen Ochs Blanchfield, LRT/CTRS        Blanchfield, Denise L 06/02/2014 4:05 PM

## 2014-06-02 NOTE — Progress Notes (Signed)
Patient ID: [name] [name], male   DOB: Apr 09, 1961, 52 y.o.   MRN: [id number] He has been up for a couple ogf groups today and has been interacting with peers and staff. He refused to fill out his self inventory. He denies SI thoughts.

## 2014-06-02 NOTE — Progress Notes (Signed)
Adult Psychoeducational Group Note  Date:  06/02/2014 Time:  9:30 PM  Group Topic/Focus:  Wrap-Up Group:   The focus of this group is to help patients review their daily goal of treatment and discuss progress on daily workbooks.  Participation Level:  Active  Participation Quality:  Appropriate and Drowsy  Affect:  Appropriate  Cognitive:  Appropriate  Insight: Appropriate  Engagement in Group:  Engaged  Modes of Intervention:  Discussion  Additional Comments:  Pt attended the wrap up group this evening and remained appropriate and engaged throughout the duration of the group. Pt ranked his day as an 8 because he's feeling better today. Pt also shared that he's getting back on track with his sleep and day to day routine.  Sandi Mariscal O 06/02/2014, 9:30 PM

## 2014-06-02 NOTE — Tx Team (Signed)
Interdisciplinary Treatment Plan Update (Adult)  Date: 06/02/2014   Time Reviewed: 10:14 AM  Progress in Treatment:  Attending groups: No  Participating in groups:  No  Taking medication as prescribed: Yes  Tolerating medication: Yes  Family/Significant othe contact made: No. SPE not required for this pt.   Patient understands diagnosis: Yes, AEB seeking treatment for ETOH detox, mood stabilization, and med management.  Discussing patient identified problems/goals with staff: Yes  Medical problems stabilized or resolved: Yes  Denies suicidal/homicidal ideation: Yes during admission, group and self report  Patient has not harmed self or Others: Yes  New problem(s) identified:  Discharge Plan or Barriers: Pt plans to go to [facility]-referral made 7/6. [facility] for med management. Additional comments:  Pt admitted to [facility] adult unit, [address] hall for ETOH detox--pt denies SI/HI/AVH, CIWA 14 currently, pt has severe tremor, c/o agitation/anxiety, nausea, pt states that "this is the worst it has ever been" in reference to his withdrawal s/s, pt denies withdrawal seizure history, Pt cooperative and pleasant during admission.    Reason for Continuation of Hospitalization: Ativan taper-withdrawals Mood stabilization Med management Estimated length of stay: 3-5 days  For review of initial/current patient goals, please see plan of care.  Attendees:  Patient:    Family:    Physician: Carlton Adam MD 06/02/2014 10:13 AM   Nursing: Butch Penny RN  06/02/2014 10:13 AM   Clinical Social Worker [place], [place]  06/02/2014 10:13 AM   Other: Jan RN  06/02/2014 10:13 AM   Other:    Other: Gerline Legacy Nurse CM  06/02/2014 10:13 AM   Other:    Scribe for Treatment Team:  [place] LCSWA 06/02/2014 10:14 AM

## 2014-06-02 NOTE — Progress Notes (Signed)
D: Patient anxious and isolated to room. Patient stated that he felt better today than he has. Patient voiced no complaints.  A: Patient given support and encouragement.  R: Patient compliant with medication and treatment plan.

## 2014-06-02 NOTE — Progress Notes (Signed)
[facility] MD Progress Note  06/02/2014 5:50 PM [name] [name]  MRN:  [id number] Subjective:  [name] [name] states he is very frustrated with himself and his relapses. States he is still under the assumption he can have the one drink. He recently left [facility]. He states he is not sure where to go from here. Rationally knows his liver is affected and he still drinks. Endorses that the anxiety is also a factor in his use Diagnosis:   DSM5: Schizophrenia Disorders:  none Obsessive-Compulsive Disorders:  none Trauma-Stressor Disorders:  none Substance/Addictive Disorders:  Alcohol Related Disorder - Severe (303.90) Depressive Disorders:  Major Depressive Disorder - Moderate (296.22) Total Time spent with patient: 30 minutes  Axis I: Generalized Anxiety Disorder  ADL's:  Intact  Sleep: Fair  Appetite:  Fair  Suicidal Ideation:  Plan:  denies Intent:  denies Means:  denies Homicidal Ideation:  Plan:  denies Intent:  denies Means:  denies AEB (as evidenced by):  Psychiatric Specialty Exam: Physical Exam  Review of Systems  Constitutional: Positive for malaise/fatigue.  HENT: Negative.   Eyes: Negative.   Respiratory: Negative.   Cardiovascular: Negative.   Gastrointestinal: Negative.   Genitourinary: Negative.   Musculoskeletal: Negative.   Skin: Negative.   Neurological: Positive for weakness.  Endo/Heme/Allergies: Negative.   Psychiatric/Behavioral: Positive for depression and substance abuse. The patient is nervous/anxious.     Blood pressure 120/91, pulse 97, temperature 98.7 F (37.1 C), temperature source Oral, resp. rate 18, height 5\' 9"  (1.753 m), weight 61.689 kg (136 lb), SpO2 98.00%.Body mass index is 20.07 kg/(m^2).  General Appearance: Fairly Groomed  Engineer, water::  Fair  Speech:  Clear and Coherent  Volume:  Decreased  Mood:  Anxious, Depressed and frustrated  Affect:  anxious, worried  Thought Process:  Coherent and Goal Directed  Orientation:  Full (Time, Place, and  Person)  Thought Content:  symptoms worries concerns  Suicidal Thoughts:  No  Homicidal Thoughts:  No  Memory:  Immediate;   Fair Recent;   Fair Remote;   Fair  Judgement:  Fair  Insight:  Present  Psychomotor Activity:  Restlessness  Concentration:  Fair  Recall:  [employer] of Knowledge:NA  Language: Fair  Akathisia:  No  Handed:    AIMS (if indicated):     Assets:  Desire for Improvement  Sleep:  Number of Hours: 6.75   Musculoskeletal: Strength & Muscle Tone: within normal limits Gait & Station: normal Patient leans: N/A  Current Medications: Current Facility-Administered Medications  Medication Dose Route Frequency Provider Last Rate Last Dose  . alum & mag hydroxide-simeth (MAALOX/MYLANTA) 200-200-20 MG/5ML suspension 30 mL  30 mL Oral Q4H PRN Lurena Nida, NP      . doxepin (SINEQUAN) capsule 25 mg  25 mg Oral QHS PRN Lurena Nida, NP   25 mg at 06/01/14 0350  . escitalopram (LEXAPRO) tablet 10 mg  10 mg Oral Daily Lurena Nida, NP   10 mg at 06/02/14 0938  . feeding supplement (ENSURE COMPLETE) (ENSURE COMPLETE) liquid 237 mL  237 mL Oral BID BM Nicholaus Bloom, MD   237 mL at 06/02/14 1307  . folic acid (FOLVITE) tablet 1 mg  1 mg Oral Daily Waylan Boga, NP   1 mg at 06/02/14 1829  . gabapentin (NEURONTIN) capsule 200 mg  200 mg Oral BID Lurena Nida, NP   200 mg at 06/02/14 1651  . hydrOXYzine (ATARAX/VISTARIL) tablet 25 mg  25 mg Oral Q6H PRN Lilian Kapur  Meryl Crutch, NP   25 mg at 06/01/14 1321  . loperamide (IMODIUM) capsule 2-4 mg  2-4 mg Oral PRN Nicholaus Bloom, MD      . LORazepam (ATIVAN) tablet 1 mg  1 mg Oral Q6H PRN Nicholaus Bloom, MD   1 mg at 06/01/14 1041  . [START ON 06/03/2014] LORazepam (ATIVAN) tablet 1 mg  1 mg Oral BID Nicholaus Bloom, MD       Followed by  . [START ON 06/04/2014] LORazepam (ATIVAN) tablet 1 mg  1 mg Oral Daily Nicholaus Bloom, MD      . magnesium hydroxide (MILK OF MAGNESIA) suspension 30 mL  30 mL Oral Daily PRN Lurena Nida, NP      .  multivitamin with minerals tablet 1 tablet  1 tablet Oral Daily Lurena Nida, NP   1 tablet at 06/02/14 [phone]  . nicotine (NICODERM CQ - dosed in mg/24 hours) patch 21 mg  21 mg Transdermal Daily Waylan Boga, NP      . ondansetron (ZOFRAN) tablet 4 mg  4 mg Oral Q8H PRN Waylan Boga, NP   4 mg at 06/01/14 1321  . pantoprazole (PROTONIX) EC tablet 40 mg  40 mg Oral Daily Lurena Nida, NP   40 mg at 06/02/14 9323  . thiamine (VITAMIN B-1) tablet 100 mg  100 mg Oral Daily Waylan Boga, NP   100 mg at 06/02/14 5573  . triamcinolone (KENALOG) 0.025 % cream   Topical BID Waylan Boga, NP      . vitamin B-12 (CYANOCOBALAMIN) tablet 100 mcg  100 mcg Oral Daily Waylan Boga, NP   100 mcg at 06/02/14 0828  . vitamin C (ASCORBIC ACID) tablet 250 mg  250 mg Oral Daily Waylan Boga, NP   250 mg at 06/02/14 2202    Lab Results:  Results for orders placed during the hospital encounter of 05/31/14 (from the past 48 hour(s))  ETHANOL     Status: Abnormal   Collection Time    05/31/14  9:26 PM      Result Value Ref Range   Alcohol, Ethyl (B) 267 (*) 0 - 11 mg/dL   Comment:            LOWEST DETECTABLE LIMIT FOR     SERUM ALCOHOL IS 11 mg/dL     FOR MEDICAL PURPOSES ONLY    Physical Findings: AIMS: Facial and Oral Movements Muscles of Facial Expression: None, normal Lips and Perioral Area: None, normal Jaw: None, normal Tongue: None, normal,Extremity Movements Upper (arms, wrists, hands, fingers): None, normal Lower (legs, knees, ankles, toes): None, normal, Trunk Movements Neck, shoulders, hips: None, normal, Overall Severity Severity of abnormal movements (highest score from questions above): None, normal Incapacitation due to abnormal movements: None, normal Patient's awareness of abnormal movements (rate only patient's report): No Awareness, Dental Status Current problems with teeth and/or dentures?: No Does patient usually wear dentures?: No  CIWA:  CIWA-Ar Total: 5 COWS:  COWS Total Score:  5  Treatment Plan Summary: Daily contact with patient to assess and evaluate symptoms and progress in treatment Medication management  Plan: Supportive approach/coping skills/relapse prevention           Ativan Detox/reassess and address the co morbities           CBT;mindfulness anxiety, stress management Medical Decision Making Problem Points:  Review of psycho-social stressors (1) Data Points:  Review of medication regiment & side effects (2)  I certify that inpatient services  furnished can reasonably be expected to improve the patient's condition.   LUGO,IRVING A 06/02/2014, 5:50 PM

## 2014-06-02 NOTE — [facility] Group Notes (Signed)
[facility] LCSW Group Therapy  06/02/2014 3:37 PM  Type of Therapy:  Group Therapy  Participation Level:  Active  Participation Quality:  Attentive  Affect:  Appropriate  Cognitive:  Alert and Oriented  Insight:  Engaged  Engagement in Therapy:  Engaged  Modes of Intervention:  Confrontation, Discussion, Education, Exploration, Problem-solving, Rapport Building, Socialization and Support  Summary of Progress/Problems: [facility] Speaker came to talk about his personal journey with substance abuse and addiction. The pt processed ways by which to relate to the speaker. [place] speaker provided handouts and educational information pertaining to groups and services offered by the [facility]. H[place]hel was attentive and engaged throughout today's therapy group. He thanked the speaker after group concluded.    Smart, Heather LCSWA  06/02/2014, 3:37 PM

## 2014-06-03 DIAGNOSIS — F10988 Alcohol use, unspecified with other alcohol-induced disorder: Secondary | ICD-10-CM

## 2014-06-03 MED ORDER — HYDROXYZINE HCL 25 MG PO TABS
ORAL_TABLET | ORAL | Status: DC
Start: 2014-06-03 — End: 2014-11-11

## 2014-06-03 MED ORDER — VITAMIN B-12 100 MCG PO TABS: 100.0000 ug | ORAL_TABLET | Freq: Every day | ORAL | Status: DC

## 2014-06-03 MED ORDER — PANTOPRAZOLE SODIUM 40 MG PO TBEC
40.0000 mg | DELAYED_RELEASE_TABLET | Freq: Every day | ORAL | Status: DC
Start: 2014-06-03 — End: 2014-11-11

## 2014-06-03 MED ORDER — GABAPENTIN 100 MG PO CAPS: 200.0000 mg | ORAL_CAPSULE | Freq: Two times a day (BID) | ORAL | Status: DC

## 2014-06-03 MED ORDER — TRIAMCINOLONE ACETONIDE 0.025 % EX CREA: TOPICAL_CREAM | Freq: Two times a day (BID) | CUTANEOUS | Status: DC

## 2014-06-03 MED ORDER — DOXEPIN HCL 25 MG PO CAPS: 25.0000 mg | ORAL_CAPSULE | Freq: Every evening | ORAL | Status: DC | PRN

## 2014-06-03 MED ORDER — ADULT MULTIVITAMIN W/MINERALS CH: 1.0000 | ORAL_TABLET | Freq: Every day | ORAL | Status: DC

## 2014-06-03 MED ORDER — ESCITALOPRAM OXALATE 10 MG PO TABS: 10.0000 mg | ORAL_TABLET | Freq: Every day | ORAL | Status: DC

## 2014-06-03 MED ORDER — HYDROXYZINE HCL 25 MG PO TABS
25.0000 mg | ORAL_TABLET | Freq: Three times a day (TID) | ORAL | Status: DC | PRN
  Filled 2014-06-03: qty 30

## 2014-06-03 NOTE — Discharge Summary (Signed)
Physician Discharge Summary Note  Patient:  [name] [name] is an 53 y.o., male MRN:  [id number] DOB:  November 02, 1961 Patient phone:  [phone] (home)  Patient address:   [place] [address],  Total Time spent with patient: Greater than 30 minutes  Date of Admission:  06/01/2014 Date of Discharge: 06/03/14  Reason for Admission: Alcohol detox  Discharge Diagnoses: Active Problems:   Alcohol dependence   Psychiatric Specialty Exam: Physical Exam  Psychiatric: His speech is normal and behavior is normal. Judgment and thought content normal. His mood appears not anxious. His affect is not angry, not blunt, not labile and not inappropriate. Cognition and memory are normal. He does not exhibit a depressed mood.    Review of Systems  Constitutional: Negative.   HENT: Negative.   Eyes: Negative.   Respiratory: Negative.   Cardiovascular: Negative.   Gastrointestinal: Negative.   Genitourinary: Negative.   Musculoskeletal: Negative.   Skin: Negative.   Neurological: Negative.   Endo/Heme/Allergies: Negative.   Psychiatric/Behavioral: Positive for depression (Stable) and substance abuse (Alcoholism, chronic). Negative for suicidal ideas, hallucinations and memory loss. The patient has insomnia (Stable). The patient is not nervous/anxious.     Blood pressure 136/98, pulse 107, temperature 99 F (37.2 C), temperature source Oral, resp. rate 18, height _0  (1.753 m), weight 61.689 kg (136 lb), SpO2 98.00%.Body mass index is 20.07 kg/(m^2).   General Appearance: Fairly Groomed  Engineer, water:: Fair  Speech: Clear and Coherent  Volume: Normal  Mood: Euthymic  Affect: Appropriate  Thought Process: Coherent and Goal Directed  Orientation: Full (Time, Place, and Person)  Thought Content: plans as he moves on, relapse prevention plan  Suicidal Thoughts: No  Homicidal Thoughts: No Memory: Immediate; Fair  Recent; Fair  Remote; Fair  Judgement: Fair  Insight: Present   Psychomotor Activity: Normal  Concentration: Fair  Recall: [employer] of Knowledge:NA  Language: Fair  Akathisia: No  Handed: Right  AIMS (if indicated): Assets: Desire for Improvement  Housing  Social Support  Sleep: Number of Hours: 6.75   Past Psychiatric History: Diagnosis: Alcohol dependence, Alcohol induced mood disorder  Hospitalizations: [facility]  Outpatient Care: [facility]  Substance Abuse Care: [facility]  Self-Mutilation: NA  Suicidal Attempts: NA  Violent Behaviors: NA   Musculoskeletal: Strength & Muscle Tone: within normal limits Gait & Station: normal Patient leans: N/A  DSM5: Schizophrenia Disorders:  NA Obsessive-Compulsive Disorders:  NA Trauma-Stressor Disorders:  NA Substance/Addictive Disorders:  Alcohol Related Disorder - Severe (303.90) Depressive Disorders:  Alcohol-induced mood disorder  Axis Diagnosis:  AXIS I:  Alcohol dependence, Alcohol-induced mood disorder AXIS II:  Deferred AXIS III:   Past Medical History  Diagnosis Date  . Alcohol abuse   . Sarcoidosis of lung   . Reflux   . Depression   . Anxiety   . Pancreatitis   . Carpal tunnel syndrome of right wrist   . GERD (gastroesophageal reflux disease)    AXIS IV:  other psychosocial or environmental problems and Alcoholism, chronic AXIS V:  62  Level of Care:  OP  Hospital Course:  Seneca is 53 years old, African-American male. He reports, "I was dropped off at the hospital by a friend because I was shaking real bad. Then, my leg started to hurt, I felt some numbness. I got scared. My alcohol drinking worsened over 3 weeks ago, but I have been battling alcoholism x 20 years. I had been through some  residential substance abuse treatments. I drink to help me from having panic attacks. I use alcohol to self-medicate. I don't sleep at night. Alcohol helps me sleep. I have bad anxiety, but ain't depressed or suicidal.  Tanmay was admitted  to the hospital with his toxicology test reports at 60. He reports having been drinking excessively on daily bnasis due to high level anxiety. He was here for alcohol detoxification treatment. Anothy's recent lab reports indicated elevated liver enzymes. As a result, not a candidate for Librium detox protocols. His detoxification treatment was achieved using Ativan detox regimen on a tapering dose format. This was used in place of Librium detox protocol because Librium is a long acting benzodiazepine with a long half life. If Librium was used for this detox treatment, will impose on a already compromised liver functions. This way, he received a cleaner detoxification treatment without endangering his liver functions any further. He was also enrolled in the group counseling sessions, AA/NA meetings being offered and held on this unit. He participated and learned coping skills. Allin received other medication regimen for his other medical conditions presented. He tolerated his treatment regimen without any significant adverse effects and or reactions.  Besides the treatments received here and scheduled outpatient psychiatric services, Mr. Knick also was ordered, received and discharged on Doxepin 25 mg at bedtime for sleep, Lexapro 10 mg daily for depression, Neurontin 200 mg bid for substance withdrawal syndrome and Hydroxyzine 25 mg three times daily as needed for anxiety. Adrion has completed detox treatment and his mood is stable. This is evidenced by his reports of improved mood and absence of substance withdrawal symptoms. He will resume psychiatric care and routine medication management at the [facility], here in [place], [place]. He was provided with all the necessary information required to make this appointment without problems.  Upon discharge, Mccabe adamantly denies any suicidal, homicidal ideations, auditory, visual hallucinations, delusional thoughts, paranoia and or withdrawal  symptoms. He left [facility] with all personal belongings in no apparent distress. He received 14 days worth supply samples of his discharge medications provided by [facility] pharmacy. Transportation per city bus. Bus pass provided by [facility].  Consults:  psychiatry  Significant Diagnostic Studies:  labs: CBC with diff, CMP, UDS, toxicology tests, U/A  Discharge Vitals:   Blood pressure 136/98, pulse 107, temperature 99 F (37.2 C), temperature source Oral, resp. rate 18, height _0  (1.753 m), weight 61.689 kg (136 lb), SpO2 98.00%. Body mass index is 20.07 kg/(m^2). Lab Results:   Results for orders placed during the hospital encounter of 05/31/14 (from the past 72 hour(s))  ACETAMINOPHEN LEVEL     Status: None   Collection Time    05/31/14  3:53 PM      Result Value Ref Range   Acetaminophen (Tylenol), Serum <15.0  10 - 30 ug/mL   Comment:            THERAPEUTIC CONCENTRATIONS VARY     SIGNIFICANTLY. A RANGE OF 10-30     ug/mL MAY BE AN EFFECTIVE     CONCENTRATION FOR MANY PATIENTS.     HOWEVER, SOME ARE BEST TREATED     AT CONCENTRATIONS OUTSIDE THIS     RANGE.     ACETAMINOPHEN CONCENTRATIONS     >150 ug/mL AT 4 HOURS AFTER     INGESTION AND >50 ug/mL AT 12     HOURS AFTER INGESTION ARE     OFTEN ASSOCIATED WITH TOXIC     REACTIONS.  CBC     Status: Abnormal   Collection Time    05/31/14  3:53 PM      Result Value Ref Range   WBC 4.8  4.0 - 10.5 K/uL   RBC 5.14  4.22 - 5.81 MIL/uL   Hemoglobin 13.6  13.0 - 17.0 g/dL   HCT 40.6  39.0 - 52.0 %   MCV 79.0  78.0 - 100.0 fL   MCH 26.5  26.0 - 34.0 pg   MCHC 33.5  30.0 - 36.0 g/dL   RDW 17.2 (*) 11.5 - 15.5 %   Platelets 95 (*) 150 - 400 K/uL   Comment: RESULT REPEATED AND VERIFIED     SPECIMEN CHECKED FOR CLOTS     PLATELET COUNT CONFIRMED BY SMEAR  COMPREHENSIVE METABOLIC PANEL     Status: Abnormal   Collection Time    05/31/14  3:53 PM      Result Value Ref Range   Sodium 131 (*) 137 - 147 mEq/L   Potassium 4.7  3.7 - 5.3 mEq/L    Chloride 87 (*) 96 - 112 mEq/L   CO2 22  19 - 32 mEq/L   Glucose, Bld 83  70 - 99 mg/dL   BUN 7  6 - 23 mg/dL   Creatinine, Ser 0.79  0.50 - 1.35 mg/dL   Calcium 9.7  8.4 - 10.5 mg/dL   Total Protein 9.5 (*) 6.0 - 8.3 g/dL   Albumin 4.3  3.5 - 5.2 g/dL   AST 170 (*) 0 - 37 U/L   ALT 110 (*) 0 - 53 U/L   Alkaline Phosphatase 49  39 - 117 U/L   Total Bilirubin 0.5  0.3 - 1.2 mg/dL   GFR calc non Af Amer >90  >90 mL/min   GFR calc Af Amer >90  >90 mL/min   Comment: (NOTE)     The eGFR has been calculated using the CKD EPI equation.     This calculation has not been validated in all clinical situations.     eGFR's persistently <90 mL/min signify possible Chronic Kidney     Disease.   Anion gap 22 (*) 5 - 15  ETHANOL     Status: Abnormal   Collection Time    05/31/14  3:53 PM      Result Value Ref Range   Alcohol, Ethyl (B) 398 (*) 0 - 11 mg/dL   Comment:            LOWEST DETECTABLE LIMIT FOR     SERUM ALCOHOL IS 11 mg/dL     FOR MEDICAL PURPOSES ONLY  SALICYLATE LEVEL     Status: Abnormal   Collection Time    05/31/14  3:53 PM      Result Value Ref Range   Salicylate Lvl <5.4 (*) 2.8 - 20.0 mg/dL  LIPASE, BLOOD     Status: Abnormal   Collection Time    05/31/14  4:05 PM      Result Value Ref Range   Lipase 203 (*) 11 - 59 U/L  URINE RAPID DRUG SCREEN (HOSP PERFORMED)     Status: None   Collection Time    05/31/14  4:34 PM      Result Value Ref Range   Opiates NONE DETECTED  NONE DETECTED   Cocaine NONE DETECTED  NONE DETECTED   Benzodiazepines NONE DETECTED  NONE DETECTED   Amphetamines NONE DETECTED  NONE DETECTED   Tetrahydrocannabinol NONE DETECTED  NONE DETECTED   Barbiturates  NONE DETECTED  NONE DETECTED   Comment:            DRUG SCREEN FOR MEDICAL PURPOSES     ONLY.  IF CONFIRMATION IS NEEDED     FOR ANY PURPOSE, NOTIFY LAB     WITHIN 5 DAYS.                LOWEST DETECTABLE LIMITS     FOR URINE DRUG SCREEN     Drug Class       Cutoff (ng/mL)      Amphetamine      1000     Barbiturate      200     Benzodiazepine   458     Tricyclics       099     Opiates          300     Cocaine          300     THC              50  ETHANOL     Status: Abnormal   Collection Time    05/31/14  9:26 PM      Result Value Ref Range   Alcohol, Ethyl (B) 267 (*) 0 - 11 mg/dL   Comment:            LOWEST DETECTABLE LIMIT FOR     SERUM ALCOHOL IS 11 mg/dL     FOR MEDICAL PURPOSES ONLY    Physical Findings: AIMS: Facial and Oral Movements Muscles of Facial Expression: None, normal Lips and Perioral Area: None, normal Jaw: None, normal Tongue: None, normal,Extremity Movements Upper (arms, wrists, hands, fingers): None, normal Lower (legs, knees, ankles, toes): None, normal, Trunk Movements Neck, shoulders, hips: None, normal, Overall Severity Severity of abnormal movements (highest score from questions above): None, normal Incapacitation due to abnormal movements: None, normal Patient's awareness of abnormal movements (rate only patient's report): No Awareness, Dental Status Current problems with teeth and/or dentures?: No Does patient usually wear dentures?: No  CIWA:  CIWA-Ar Total: 3 COWS:  COWS Total Score: 5  Psychiatric Specialty Exam: See Psychiatric Specialty Exam and Suicide Risk Assessment completed by Attending Physician prior to discharge.  Discharge destination:  Home  Is patient on multiple antipsychotic therapies at discharge:  No   Has Patient had three or more failed trials of antipsychotic monotherapy by history:  No  Recommended Plan for Multiple Antipsychotic Therapies: NA     Medication List    STOP taking these medications       omeprazole 20 MG capsule  Commonly known as:  PRILOSEC      TAKE these medications     Indication   doxepin 25 MG capsule  Commonly known as:  SINEQUAN  Take 1 capsule (25 mg total) by mouth at bedtime as needed (Insomnia).   Indication:  Major Depressive Disorder, Insomnia      escitalopram 10 MG tablet  Commonly known as:  LEXAPRO  Take 1 tablet (10 mg total) by mouth daily. For depression   Indication:  Depression, Generalized Anxiety Disorder     gabapentin 100 MG capsule  Commonly known as:  NEURONTIN  Take 2 capsules (200 mg total) by mouth 2 (two) times daily. For Substance withdrawal syndrome   Indication:  Substance withdrawal syndrome     hydrOXYzine 25 MG tablet  Commonly known as:  ATARAX/VISTARIL  Take 1 tablet (25 mg) three times daily as needed for anxiety  Indication:  Anxiety     multivitamin with minerals Tabs tablet  Take 1 tablet by mouth daily. For low vitamin   Indication:  Vitamin deficiency     pantoprazole 40 MG tablet  Commonly known as:  PROTONIX  Take 1 tablet (40 mg total) by mouth daily. For acid reflux   Indication:  Gastroesophageal Reflux Disease     triamcinolone 0.025 % cream  Commonly known as:  KENALOG  Apply topically 2 (two) times daily. For rashes   Indication:  Skin Inflammation     vitamin B-12 100 MCG tablet  Commonly known as:  CYANOCOBALAMIN  Take 1 tablet (100 mcg total) by mouth daily. For low B-12 vitamin   Indication:  Inadequate Vitamin B12       Follow-up Information   Follow up with [facility] [place] . (Walk in between 8am-12pm for med managment/assessment for therapy and group services. )    Contact information:   [address], [place] [address] Phone: [phone] Fax: [phone]     Follow-up recommendations: Activity:  As tolerated Diet: As recommended by your primary care doctor. Keep all scheduled follow-up appointments as recommended.     Comments: Take all your medications as prescribed by your mental healthcare provider. Report any adverse effects and or reactions from your medicines to your outpatient provider promptly. Patient is instructed and cautioned to not engage in alcohol and or illegal drug use while on prescription medicines. In the event of  worsening symptoms, patient is instructed to call the crisis hotline, 911 and or go to the nearest ED for appropriate evaluation and treatment of symptoms. Follow-up with your primary care provider for your other medical issues, concerns and or health care needs.   Total Discharge Time:  Greater than 30 minutes.  Signed: Encarnacion Slates, [place] 06/03/2014, 10:37 AM  I personally assessed the patient and formulated the plan Geralyn Flash A. Sabra Heck, M.D.

## 2014-06-03 NOTE — BHH Group Notes (Signed)
[facility] LCSW Aftercare Discharge Planning Group Note   06/03/2014 10:56 AM  Participation Quality:  Appropriate   Mood/Affect:  Appropriate  Depression Rating:  0  Anxiety Rating:  2-3  Thoughts of Suicide:  No Will you contract for safety?   NA  Current AVH:  No  Plan for Discharge/Comments:  Pt stated that he is returning home today and will follow up at [facility] for services. Pt refused inpatient referral-unable to return to [facility] until Sept.   [employer]: bus pass (in chart)  Supports: some family supports identified   Proofreader, Research officer, trade union

## 2014-06-03 NOTE — Progress Notes (Addendum)
Patient ID: [name] [name], male   DOB: November 21, 1961, 53 y.o.   MRN: [id number] He has been up and to groups interacting more with peers and staff. Self inventory: depression 0, hopelessness 1, Withdrawals of craving, diarrhea , denies SI and pain.

## 2014-06-03 NOTE — Progress Notes (Signed)
[facility] Adult Case Management Discharge Plan :  Will you be returning to the same living situation after discharge: Yes,  home At discharge, do you have transportation home?:Yes,  bus pass (in chart) Do you have the ability to pay for your medications:Yes,  mental health  Release of information consent forms completed and submitted to Medical Records by CSW. Patient to Follow up at: Follow-up Information   Follow up with [facility] [place] . (Walk in between 8am-12pm for med managment/assessment for therapy and group services. )    Contact information:   [address], [place] [address] Phone: [phone] Fax: [phone]      Patient denies SI/HI:   Yes,  during admission, group, and self report.    Safety Planning and Suicide Prevention discussed:  Yes,  SPE not required for this pt. SPI pamphlet provided to pt and he was encouraged to share information with support network, ask quetions, and talk about any concerns relating to SPE.  Smart, Heather LCSWA 06/03/2014, 10:58 AM

## 2014-06-03 NOTE — Progress Notes (Signed)
Patient ID: [name] [name], male   DOB: [birth year], 53 y.o.   MRN: [id number] He has been discharged home and was given bus passed for transportation home. He voiced understanding of discharge instruction and of follow up plan.  He denies thoughts of SI and all his belonging and money were taken home with him.

## 2014-06-03 NOTE — BHH Suicide Risk Assessment (Signed)
Suicide Risk Assessment  Discharge Assessment     Demographic Factors:  Male  Total Time spent with patient: 45 minutes  Psychiatric Specialty Exam:     Blood pressure 136/98, pulse 107, temperature 99 F (37.2 C), temperature source Oral, resp. rate 18, height 5\' 9"  (1.753 m), weight 61.689 kg (136 lb), SpO2 98.00%.Body mass index is 20.07 kg/(m^2).  General Appearance: Fairly Groomed  Engineer, water::  Fair  Speech:  Clear and Coherent  Volume:  Normal  Mood:  Euthymic  Affect:  Appropriate  Thought Process:  Coherent and Goal Directed  Orientation:  Full (Time, Place, and Person)  Thought Content:  plans as he moves on, relapse prevention plan  Suicidal Thoughts:  No  Homicidal Thoughts:  No  Memory:  Immediate;   Fair Recent;   Fair Remote;   Fair  Judgement:  Fair  Insight:  Present  Psychomotor Activity:  Normal  Concentration:  Fair  Recall:  [employer] of Knowledge:NA  Language: Fair  Akathisia:  No  Handed:  Right  AIMS (if indicated):     Assets:  Desire for Improvement Housing Social Support  Sleep:  Number of Hours: 6.75    Musculoskeletal: Strength & Muscle Tone: within normal limits Gait & Station: normal Patient leans: N/A   Mental Status Per Nursing Assessment::   On Admission:     Current Mental Status by Physician: in full contact with reality. there are no active S/S of withdrawal  Loss Factors: Financial problems/change in socioeconomic status  Historical Factors: NA  Risk Reduction Factors:   Sense of responsibility to family and Positive social support  Continued Clinical Symptoms:  Depression:   Comorbid alcohol abuse/dependence Alcohol/Substance Abuse/Dependencies  Cognitive Features That Contribute To Risk:  Closed-mindedness Polarized thinking Thought constriction (tunnel vision)    Suicide Risk:  Minimal: No identifiable suicidal ideation.  Patients presenting with no risk factors but with morbid ruminations; may be  classified as minimal risk based on the severity of the depressive symptoms  Discharge Diagnoses:   AXIS I:  Alcohol Dependence, S/P alcohol withdrawal, Generalized Anxiety Disorder, substance induced mood disorder AXIS II:  No diagnosis AXIS III:   Past Medical History  Diagnosis Date  . Alcohol abuse   . Sarcoidosis of lung   . Reflux   . Depression   . Anxiety   . Pancreatitis   . Carpal tunnel syndrome of right wrist   . GERD (gastroesophageal reflux disease)    AXIS IV:  other psychosocial or environmental problems AXIS V:  61-70 mild symptoms  Plan Of Care/Follow-up recommendations:  Activity:  as tolerated Diet:  regular Follow up outpatient basis/AA Is patient on multiple antipsychotic therapies at discharge:  No   Has Patient had three or more failed trials of antipsychotic monotherapy by history:  No  Recommended Plan for Multiple Antipsychotic Therapies: NA    LUGO,IRVING A 06/03/2014, 11:30 AM

## 2014-06-05 NOTE — Progress Notes (Signed)
Patient Discharge Instructions:  After Visit Summary (AVS):   Faxed to:  06/05/14 Discharge Summary Note:   Faxed to:  06/05/14 Psychiatric Admission Assessment Note:   Faxed to:  06/05/14 Suicide Risk Assessment - Discharge Assessment:   Faxed to:  06/05/14 Faxed/Sent to the Next Level Care provider:  06/05/14 Faxed to [facility], 06/05/2014, 4:03 PM

## 2014-06-10 ENCOUNTER — Encounter (HOSPITAL_COMMUNITY): Payer: Self-pay | Admitting: Emergency Medicine

## 2014-06-10 ENCOUNTER — Emergency Department (HOSPITAL_COMMUNITY)
Admission: EM | Admit: 2014-06-10 | Discharge: 2014-06-10 | Discharge status: Against Medical Advice | Payer: Self-pay | Attending: Emergency Medicine | Admitting: Emergency Medicine

## 2014-06-10 DIAGNOSIS — F1010 Alcohol abuse, uncomplicated: Secondary | ICD-10-CM | POA: Insufficient documentation

## 2014-06-10 LAB — CBC
HCT: 38.1 % — ABNORMAL LOW (ref 39.0–52.0)
Hemoglobin: 12.9 g/dL — ABNORMAL LOW (ref 13.0–17.0)
MCH: 27 pg (ref 26.0–34.0)
MCHC: 33.9 g/dL (ref 30.0–36.0)
MCV: 79.7 fL (ref 78.0–100.0)
Platelets: 339 10*3/uL (ref 150–400)
RBC: 4.78 MIL/uL (ref 4.22–5.81)
RDW: 17.1 % — ABNORMAL HIGH (ref 11.5–15.5)
WBC: 6.3 10*3/uL (ref 4.0–10.5)

## 2014-06-10 LAB — COMPREHENSIVE METABOLIC PANEL
ALT: 62 U/L — ABNORMAL HIGH (ref 0–53)
AST: 71 U/L — ABNORMAL HIGH (ref 0–37)
Albumin: 3.9 g/dL (ref 3.5–5.2)
Alkaline Phosphatase: 47 U/L (ref 39–117)
Anion gap: 20 — ABNORMAL HIGH (ref 5–15)
BUN: 5 mg/dL — ABNORMAL LOW (ref 6–23)
CO2: 24 mEq/L (ref 19–32)
Calcium: 9.1 mg/dL (ref 8.4–10.5)
Chloride: 89 mEq/L — ABNORMAL LOW (ref 96–112)
Creatinine, Ser: 0.78 mg/dL (ref 0.50–1.35)
GFR calc Af Amer: >90 mL/min (ref >90)
GFR calc non Af Amer: >90 mL/min (ref >90)
Glucose, Bld: 100 mg/dL — ABNORMAL HIGH (ref 70–99)
Potassium: 4.1 mEq/L (ref 3.7–5.3)
Sodium: 133 mEq/L — ABNORMAL LOW (ref 137–147)
Total Bilirubin: 0.4 mg/dL (ref 0.3–1.2)
Total Protein: 8.8 g/dL — ABNORMAL HIGH (ref 6.0–8.3)

## 2014-06-10 LAB — RAPID URINE DRUG SCREEN, HOSP PERFORMED
Amphetamines: NOT DETECTED
Barbiturates: NOT DETECTED
Benzodiazepines: NOT DETECTED
Cocaine: NOT DETECTED
Opiates: NOT DETECTED
Tetrahydrocannabinol: NOT DETECTED

## 2014-06-10 LAB — ACETAMINOPHEN LEVEL: Acetaminophen (Tylenol), Serum: <15 ug/mL (ref 10–30)

## 2014-06-10 LAB — ETHANOL: Alcohol, Ethyl (B): 446 mg/dL (ref 0–11)

## 2014-06-10 LAB — SALICYLATE LEVEL: Salicylate Lvl: <2 mg/dL — ABNORMAL LOW (ref 2.8–20.0)

## 2014-06-10 NOTE — ED Notes (Addendum)
Pt upset due to wait, stating he has been here for 6 hours, explained to patient that he has been for about 1.5 hours, pt states he is leaving and calling a cab, pt verbally abusive to staff, walked out of triage area and seen exiting the ED

## 2014-06-10 NOTE — ED Notes (Signed)
Pt states he needs to detox from etoh.  C/o generalized weakness and not eating.  Last etoh 2 hours ago.  Denies suicidal and homicidal ideation.

## 2014-06-13 ENCOUNTER — Encounter (HOSPITAL_COMMUNITY): Payer: Self-pay | Admitting: Emergency Medicine

## 2014-06-13 ENCOUNTER — Emergency Department (HOSPITAL_COMMUNITY)
Admission: EM | Admit: 2014-06-13 | Discharge: 2014-06-14 | Disposition: A | Discharge status: Home / Self Care | Payer: Self-pay | Attending: Emergency Medicine | Admitting: Emergency Medicine

## 2014-06-13 DIAGNOSIS — Z79899 Other long term (current) drug therapy: Secondary | ICD-10-CM | POA: Insufficient documentation

## 2014-06-13 DIAGNOSIS — Z8619 Personal history of other infectious and parasitic diseases: Secondary | ICD-10-CM | POA: Insufficient documentation

## 2014-06-13 DIAGNOSIS — F329 Major depressive disorder, single episode, unspecified: Secondary | ICD-10-CM | POA: Insufficient documentation

## 2014-06-13 DIAGNOSIS — F1020 Alcohol dependence, uncomplicated: Secondary | ICD-10-CM | POA: Insufficient documentation

## 2014-06-13 DIAGNOSIS — F10939 Alcohol use, unspecified with withdrawal, unspecified: Secondary | ICD-10-CM | POA: Diagnosis present

## 2014-06-13 DIAGNOSIS — Z8669 Personal history of other diseases of the nervous system and sense organs: Secondary | ICD-10-CM | POA: Insufficient documentation

## 2014-06-13 DIAGNOSIS — IMO0002 Reserved for concepts with insufficient information to code with codable children: Secondary | ICD-10-CM | POA: Insufficient documentation

## 2014-06-13 DIAGNOSIS — F1010 Alcohol abuse, uncomplicated: Secondary | ICD-10-CM

## 2014-06-13 DIAGNOSIS — Z87891 Personal history of nicotine dependence: Secondary | ICD-10-CM | POA: Insufficient documentation

## 2014-06-13 DIAGNOSIS — F10230 Alcohol dependence with withdrawal, uncomplicated: Secondary | ICD-10-CM

## 2014-06-13 DIAGNOSIS — K219 Gastro-esophageal reflux disease without esophagitis: Secondary | ICD-10-CM | POA: Insufficient documentation

## 2014-06-13 DIAGNOSIS — F1094 Alcohol use, unspecified with alcohol-induced mood disorder: Secondary | ICD-10-CM | POA: Diagnosis present

## 2014-06-13 DIAGNOSIS — F10239 Alcohol dependence with withdrawal, unspecified: Secondary | ICD-10-CM | POA: Diagnosis present

## 2014-06-13 DIAGNOSIS — F411 Generalized anxiety disorder: Secondary | ICD-10-CM | POA: Insufficient documentation

## 2014-06-13 DIAGNOSIS — F3289 Other specified depressive episodes: Secondary | ICD-10-CM | POA: Insufficient documentation

## 2014-06-13 LAB — RAPID URINE DRUG SCREEN, HOSP PERFORMED
Amphetamines: NOT DETECTED
Barbiturates: NOT DETECTED
Benzodiazepines: NOT DETECTED
Cocaine: NOT DETECTED
Opiates: NOT DETECTED
Tetrahydrocannabinol: NOT DETECTED

## 2014-06-13 LAB — CBC
HCT: 38.4 % — ABNORMAL LOW (ref 39.0–52.0)
Hemoglobin: 12.7 g/dL — ABNORMAL LOW (ref 13.0–17.0)
MCH: 26.7 pg (ref 26.0–34.0)
MCHC: 33.1 g/dL (ref 30.0–36.0)
MCV: 80.7 fL (ref 78.0–100.0)
Platelets: 292 10*3/uL (ref 150–400)
RBC: 4.76 MIL/uL (ref 4.22–5.81)
RDW: 16.8 % — ABNORMAL HIGH (ref 11.5–15.5)
WBC: 7 10*3/uL (ref 4.0–10.5)

## 2014-06-13 LAB — COMPREHENSIVE METABOLIC PANEL
ALT: 80 U/L — ABNORMAL HIGH (ref 0–53)
AST: 111 U/L — ABNORMAL HIGH (ref 0–37)
Albumin: 4 g/dL (ref 3.5–5.2)
Alkaline Phosphatase: 46 U/L (ref 39–117)
Anion gap: 17 — ABNORMAL HIGH (ref 5–15)
BUN: 5 mg/dL — ABNORMAL LOW (ref 6–23)
CO2: 26 mEq/L (ref 19–32)
Calcium: 9.2 mg/dL (ref 8.4–10.5)
Chloride: 92 mEq/L — ABNORMAL LOW (ref 96–112)
Creatinine, Ser: 0.87 mg/dL (ref 0.50–1.35)
GFR calc Af Amer: >90 mL/min (ref >90)
GFR calc non Af Amer: >90 mL/min (ref >90)
Glucose, Bld: 167 mg/dL — ABNORMAL HIGH (ref 70–99)
Potassium: 4.1 mEq/L (ref 3.7–5.3)
Sodium: 135 mEq/L — ABNORMAL LOW (ref 137–147)
Total Bilirubin: 0.4 mg/dL (ref 0.3–1.2)
Total Protein: 8.6 g/dL — ABNORMAL HIGH (ref 6.0–8.3)

## 2014-06-13 LAB — ETHANOL: Alcohol, Ethyl (B): 427 mg/dL (ref 0–11)

## 2014-06-13 MED ORDER — VITAMIN B-1 100 MG PO TABS
100.0000 mg | ORAL_TABLET | Freq: Every day | ORAL | Status: DC
  Administered 2014-06-13 – 2014-06-14 (×2): 100 mg via ORAL
  Filled 2014-06-13 (×2): qty 1
  Filled 2014-06-13: qty 14

## 2014-06-13 MED ORDER — ADULT MULTIVITAMIN W/MINERALS CH
1.0000 | ORAL_TABLET | Freq: Every day | ORAL | Status: DC
  Administered 2014-06-13 – 2014-06-14 (×2): 1 via ORAL
  Filled 2014-06-13 (×2): qty 1
  Filled 2014-06-13: qty 14

## 2014-06-13 MED ORDER — ACETAMINOPHEN 325 MG PO TABS: 650.0000 mg | ORAL_TABLET | ORAL | Status: DC | PRN

## 2014-06-13 MED ORDER — ONDANSETRON HCL 4 MG PO TABS: 4.0000 mg | ORAL_TABLET | Freq: Three times a day (TID) | ORAL | Status: DC | PRN

## 2014-06-13 MED ORDER — IBUPROFEN 200 MG PO TABS: 600.0000 mg | ORAL_TABLET | Freq: Three times a day (TID) | ORAL | Status: DC | PRN

## 2014-06-13 MED ORDER — GABAPENTIN 100 MG PO CAPS
200.0000 mg | ORAL_CAPSULE | Freq: Two times a day (BID) | ORAL | Status: DC
  Administered 2014-06-13 – 2014-06-14 (×2): 200 mg via ORAL
  Filled 2014-06-13 (×3): qty 2

## 2014-06-13 MED ORDER — ZOLPIDEM TARTRATE 5 MG PO TABS: 5.0000 mg | ORAL_TABLET | Freq: Every evening | ORAL | Status: DC | PRN

## 2014-06-13 MED ORDER — DOXEPIN HCL 25 MG PO CAPS
25.0000 mg | ORAL_CAPSULE | Freq: Every evening | ORAL | Status: DC | PRN
  Filled 2014-06-13: qty 1

## 2014-06-13 MED ORDER — PANTOPRAZOLE SODIUM 40 MG PO TBEC
40.0000 mg | DELAYED_RELEASE_TABLET | Freq: Every day | ORAL | Status: DC
  Administered 2014-06-13 – 2014-06-14 (×2): 40 mg via ORAL
  Filled 2014-06-13: qty 1
  Filled 2014-06-13: qty 14
  Filled 2014-06-13: qty 1

## 2014-06-13 MED ORDER — HYDROXYZINE HCL 25 MG PO TABS
25.0000 mg | ORAL_TABLET | Freq: Three times a day (TID) | ORAL | Status: DC | PRN
  Administered 2014-06-14 (×2): 25 mg via ORAL
  Filled 2014-06-13 (×2): qty 1

## 2014-06-13 MED ORDER — ESCITALOPRAM OXALATE 10 MG PO TABS
10.0000 mg | ORAL_TABLET | Freq: Every day | ORAL | Status: DC
  Administered 2014-06-13 – 2014-06-14 (×2): 10 mg via ORAL
  Filled 2014-06-13 (×2): qty 1
  Filled 2014-06-13: qty 14

## 2014-06-13 MED ORDER — NICOTINE 21 MG/24HR TD PT24
21.0000 mg | MEDICATED_PATCH | Freq: Every day | TRANSDERMAL | Status: DC | PRN
  Filled 2014-06-13: qty 14
  Filled 2014-06-13: qty 1

## 2014-06-13 MED ORDER — LORAZEPAM 1 MG PO TABS
0.0000 mg | ORAL_TABLET | Freq: Four times a day (QID) | ORAL | Status: DC
Start: 2014-06-13 — End: 2014-06-14
  Administered 2014-06-13: 1 mg via ORAL
  Administered 2014-06-14: 2 mg via ORAL
  Administered 2014-06-14: 1 mg via ORAL
  Filled 2014-06-13: qty 1
  Filled 2014-06-13: qty 2
  Filled 2014-06-13: qty 1

## 2014-06-13 MED ORDER — ALUM & MAG HYDROXIDE-SIMETH 200-200-20 MG/5ML PO SUSP: 30.0000 mL | ORAL | Status: DC | PRN

## 2014-06-13 MED ORDER — THIAMINE HCL 100 MG/ML IJ SOLN: 100.0000 mg | Freq: Every day | INTRAMUSCULAR | Status: DC

## 2014-06-13 MED ORDER — LORAZEPAM 1 MG PO TABS: 0.0000 mg | ORAL_TABLET | Freq: Two times a day (BID) | ORAL | Status: DC

## 2014-06-13 NOTE — BH Assessment (Signed)
Assessment Note  [name] [name] is an 53 y.o. male.  -Clinician talked to Dr. Eulis Foster about need for TTS.  He said that patient is a functioning alcoholic that wants to detox.  Patient was at [facility] on 06/01/14 and before in June.  Patient does request detox from ETOH.  His last drink was around 13:00 today.  He said that he only drinks two 40's per day.  His BAL was 427 at 19:07 which would indicate he is probably drinking more than stated amount.  Patient denies any SI, HI or A/V hallucinations.  He was last assessed on 07/05 and was in the observation unit at [facility].  Patient says that he has a hard time with quitting ETOH and can point to only a 4 month length of time when he was sober in either '06 or '07.  Patient complains of some knee joint pain and occasional numbness in feet but is able to ambulate independently.  He said that he has some medications that he takes for anxiety but he has not taken them in over two months, preferring to drink instead.  Patient's pharmacy is the [facility] near [facility].  At the current time patient has no outpatient provider.  -Patient care discussed with Dr. Eulis Foster.  He was informed that [facility] had beds and would need a lab showing that the BAL was <160.  Dr. Eulis Foster said that would be in 12 hours time.  This clinician did call [facility] and they have beds available.  Prescreen will be completed and the referral will be sent to them after 07:00 on 07/19.  Patient does have a photo ID and is fine with the referral to [facility].  Axis I: 303.90 ETOH use d/o severe Axis II: Deferred Axis III:  Past Medical History  Diagnosis Date  . Alcohol abuse   . Sarcoidosis of lung   . Reflux   . Depression   . Anxiety   . Pancreatitis   . Carpal tunnel syndrome of right wrist   . GERD (gastroesophageal reflux disease)    Axis IV: economic problems, housing problems, occupational problems, other psychosocial or environmental problems and problems with access to health care  services Axis V: 31-40 impairment in reality testing  Past Medical History:  Past Medical History  Diagnosis Date  . Alcohol abuse   . Sarcoidosis of lung   . Reflux   . Depression   . Anxiety   . Pancreatitis   . Carpal tunnel syndrome of right wrist   . GERD (gastroesophageal reflux disease)     Past Surgical History  Procedure Laterality Date  . Knee arthroscopy    . Carpel tunnel release    . Elbow surgery      Family History:  Family History  Problem Relation Age of Onset  . Diabetes Father   . Hypertension Father   . Hypertension Paternal Grandfather   . Alcohol abuse Paternal Grandfather   . Alcohol abuse Maternal Grandfather   . Alcohol abuse Maternal Grandmother   . Alcohol abuse Paternal Grandmother     Social History:  reports that he has quit smoking. His smoking use included Cigars. He does not have any smokeless tobacco history on file. He reports that he drinks alcohol. He reports that he does not use illicit drugs.  Additional Social History:  Alcohol / Drug Use Pain Medications: See PTA medication list. Prescriptions: See PTA medication list.  Pt says that he has not taken his prescribed meds in two  months or more since he prefers to drink instead of take meds. Over the Counter: See  PTA meds list History of alcohol / drug use?: Yes Withdrawal Symptoms: Agitation;Diarrhea;Sweats;Fever / Chills;Nausea / Vomiting;Patient aware of relationship between substance abuse and physical/medical complications;Weakness;Tremors Substance #1 Name of Substance 1: ETOH 1 - Age of First Use: Teens 1 - Amount (size/oz): Drinks two 40's per day 1 - Frequency: Daily use 1 - Duration: ongoing 1 - Last Use / Amount: Reports drinking two foriies today (07/18) around 13:00.  CIWA: CIWA-Ar BP: 119/66 mmHg Pulse Rate: 101 COWS:    Allergies:  Allergies  Allergen Reactions  . Oxycodone Other (See Comments)    abd pain, stomach cramps     Home Medications:  (Not  in a hospital admission)  OB/GYN Status:  No LMP for male patient.  General Assessment Data Location of Assessment: [facility] ED Is this a Tele or Face-to-Face Assessment?: Face-to-Face Is this an Initial Assessment or a Re-assessment for this encounter?: Initial Assessment Living Arrangements: Alone Can pt return to current living arrangement?: Yes Admission Status: Voluntary Is patient capable of signing voluntary admission?: Yes Transfer from: [facility] Referral Source: Self/Family/Friend     [place] Living Arrangements: Alone Name of Psychiatrist: None Name of Therapist: None     Risk to self Suicidal Ideation: No Suicidal Intent: No Is patient at risk for suicide?: No Suicidal Plan?: No Access to Means: No What has been your use of drugs/alcohol within the last 12 months?: ETOH abuse  Previous Attempts/Gestures: No How many times?: 0 Other Self Harm Risks: None Triggers for Past Attempts: None known Intentional Self Injurious Behavior: None Family Suicide History: Yes (Had a cousin that committed suicide) Recent stressful life event(s): Other (Comment) (Pt states his alcoholism as his main stressor.) Persecutory voices/beliefs?: No Depression: Yes Depression Symptoms: Despondent;Guilt Substance abuse history and/or treatment for substance abuse?: Yes Suicide prevention information given to non-admitted patients: Not applicable  Risk to Others Homicidal Ideation: No Thoughts of Harm to Others: No Current Homicidal Intent: No Current Homicidal Plan: No Access to Homicidal Means: No Identified Victim: No one History of harm to others?: No Assessment of Violence: None Noted Violent Behavior Description: Pt calm and cooperative Does patient have access to weapons?: No Criminal Charges Pending?: No Does patient have a court date: No  Psychosis Hallucinations: None noted Delusions: None noted  Mental Status Report Appear/Hygiene: Disheveled;In  scrubs Eye Contact: Good Motor Activity: Freedom of movement;Unremarkable Speech: Logical/coherent Level of Consciousness: Alert Mood: Anxious Affect: Appropriate to circumstance Anxiety Level: Moderate Thought Processes: Coherent;Relevant Judgement: Impaired Orientation: Person;Place;Time;Situation Obsessive Compulsive Thoughts/Behaviors: None  Cognitive Functioning Concentration: Normal Memory: Recent Intact;Remote Intact IQ: Average Insight: Poor Impulse Control: Poor Appetite: Poor Weight Loss: 13 Weight Gain: 0 Sleep: Decreased Total Hours of Sleep:  (Will sleep if he drinks.  Cannot quanify amount.) Vegetative Symptoms: None  ADLScreening [facility] Assessment Services) Patient's cognitive ability adequate to safely complete daily activities?: Yes Patient able to express need for assistance with ADLs?: Yes Independently performs ADLs?: Yes (appropriate for developmental age)  Prior Inpatient Therapy Prior Inpatient Therapy: Yes Prior Therapy Dates: 2015, 2014, 2009, 2007 Prior Therapy Facilty/Provider(s): [facility] [place], [facility] Reason for Treatment: SA/Detox/Rehab  Prior Outpatient Therapy Prior Outpatient Therapy: Yes Prior Therapy Dates: 2013-2014 Prior Therapy Facilty/Provider(s): [facility] Reason for Treatment: Depression/Anxiety  ADL Screening (condition at time of admission) Patient's cognitive ability adequate to safely complete daily activities?: Yes Is the patient deaf  or have difficulty hearing?: No Does the patient have difficulty seeing, even when wearing glasses/contacts?: No Does the patient have difficulty concentrating, remembering, or making decisions?: No Patient able to express need for assistance with ADLs?: Yes Does the patient have difficulty dressing or bathing?: No Independently performs ADLs?: Yes (appropriate for developmental age) Does the patient have difficulty walking or climbing stairs?: No (Slow.  Complains  of knee pain & foot numbness at times.) Weakness of Legs: None Weakness of Arms/Hands: None  Home Assistive Devices/Equipment Home Assistive Devices/Equipment: None    Abuse/Neglect Assessment (Assessment to be complete while patient is alone) Physical Abuse: Denies Verbal Abuse: Denies Sexual Abuse: Denies Exploitation of patient/patient's resources: Denies Self-Neglect: Denies Values / Beliefs Cultural Requests During Hospitalization: None Spiritual Requests During Hospitalization: None   Advance Directives (For Healthcare) Advance Directive: Patient does not have advance directive;Patient would not like information Pre-existing out of facility DNR order (yellow form or pink MOST form): No    Additional Information 1:1 In Past 12 Months?: No CIRT Risk: No Elopement Risk: No Does patient have medical clearance?: Yes     Disposition:  Disposition Initial Assessment Completed for this Encounter: Yes Disposition of Patient: Other dispositions Other disposition(s): Other (Comment) (Pt to be referred to [facility] when BAL is <160)  On Site Evaluation by:   Reviewed with Physician:    Raymondo Band 06/13/2014 9:16 PM

## 2014-06-13 NOTE — ED Notes (Signed)
Critical ETOH level conveyed to [employer] [employer]

## 2014-06-13 NOTE — ED Notes (Signed)
Pt changed into blue scrubs.  Security called to wand

## 2014-06-13 NOTE — ED Notes (Signed)
Per pt, has hx of etoh abuse.  Was d/c from [facility] - Pt states a month ago.  Pt was there for 3 days for detox.  Has been back to drinking with stomach pain and early morning nausea.  Decreased appetite.  Pt states lives in [place].  Pt has parents and siblings are here. His kids/wife are in [place].  Pt has been drinking for many years.

## 2014-06-13 NOTE — ED Provider Notes (Signed)
CSN: [id number]     Arrival date & time 06/13/14  1821 History   First MD Initiated Contact with Patient 06/13/14 1842     Chief Complaint  Patient presents with  . Alcohol Problem     (Consider location/radiation/quality/duration/timing/severity/associated sxs/prior Treatment) HPI  [name] [name] is a 53 y.o. male who presents for evaluation and treatment of alcoholism and request for detoxification from alcohol. He feels that short-term detoxification will assist him with his goal of cessation, alcohol. He had a recent detoxification, one month ago. He is not taking his usual medicines, and to drinking alcohol. He does not have a steady, place, to stay, and at this time. He denies suicidal or homicidal ideation. He feels like he might be depressed. He, states that he drinks 2 "40s" a day. There are no other known modifying factors.   Past Medical History  Diagnosis Date  . Alcohol abuse   . Sarcoidosis of lung   . Reflux   . Depression   . Anxiety   . Pancreatitis   . Carpal tunnel syndrome of right wrist   . GERD (gastroesophageal reflux disease)    Past Surgical History  Procedure Laterality Date  . Knee arthroscopy    . Carpel tunnel release    . Elbow surgery     Family History  Problem Relation Age of Onset  . Diabetes Father   . Hypertension Father   . Hypertension Paternal Grandfather   . Alcohol abuse Paternal Grandfather   . Alcohol abuse Maternal Grandfather   . Alcohol abuse Maternal Grandmother   . Alcohol abuse Paternal Grandmother    History  Substance Use Topics  . Smoking status: Former Smoker    Types: Cigars  . Smokeless tobacco: Not on file  . Alcohol Use: 0.0 oz/week    Review of Systems  All other systems reviewed and are negative.     Allergies  Oxycodone  Home Medications   Prior to Admission medications   Medication Sig Start Date End Date Taking? Authorizing Provider  doxepin (SINEQUAN) 25 MG capsule Take 1 capsule (25 mg total)  by mouth at bedtime as needed (Insomnia). 06/03/14  Yes Encarnacion Slates, NP  escitalopram (LEXAPRO) 10 MG tablet Take 1 tablet (10 mg total) by mouth daily. For depression 06/03/14  Yes Encarnacion Slates, NP  gabapentin (NEURONTIN) 100 MG capsule Take 2 capsules (200 mg total) by mouth 2 (two) times daily. For Substance withdrawal syndrome 06/03/14  Yes Encarnacion Slates, NP  hydrOXYzine (ATARAX/VISTARIL) 25 MG tablet Take 1 tablet (25 mg) three times daily as needed for anxiety 06/03/14  Yes Encarnacion Slates, NP  Multiple Vitamin (MULTIVITAMIN WITH MINERALS) TABS tablet Take 1 tablet by mouth daily. For low vitamin 06/03/14  Yes Encarnacion Slates, NP  pantoprazole (PROTONIX) 40 MG tablet Take 1 tablet (40 mg total) by mouth daily. For acid reflux 06/03/14  Yes Encarnacion Slates, NP  triamcinolone (KENALOG) 0.025 % cream Apply topically 2 (two) times daily. For rashes 06/03/14  Yes Encarnacion Slates, NP  vitamin B-12 (CYANOCOBALAMIN) 100 MCG tablet Take 1 tablet (100 mcg total) by mouth daily. For low B-12 vitamin 06/03/14  Yes Encarnacion Slates, NP   BP 119/66  Pulse 101  Temp(Src) 98.1 F (36.7 C) (Oral)  Resp 18  SpO2 96% Physical Exam  Nursing note and vitals reviewed. Constitutional: He is oriented to person, place, and time. He appears well-developed.  Slender  HENT:  Head: Normocephalic and atraumatic.  Right Ear: External ear normal.  Left Ear: External ear normal.  Eyes: Conjunctivae and EOM are normal. Pupils are equal, round, and reactive to light.  Neck: Normal range of motion and phonation normal. Neck supple.  Cardiovascular: Normal rate, regular rhythm, normal heart sounds and intact distal pulses.   Pulmonary/Chest: Effort normal and breath sounds normal. He exhibits no bony tenderness.  Abdominal: Soft. He exhibits no mass. There is tenderness (Epigastric, mild). There is no rebound and no guarding.  Musculoskeletal: Normal range of motion.  Neurological: He is alert and oriented to person, place, and time. No  cranial nerve deficit or sensory deficit. He exhibits normal muscle tone. Coordination normal.  Skin: Skin is warm, dry and intact.  Psychiatric: He has a normal mood and affect. His behavior is normal. Judgment and thought content normal.    ED Course  Procedures (including critical care time) Medications  acetaminophen (TYLENOL) tablet 650 mg (not administered)  ibuprofen (ADVIL,MOTRIN) tablet 600 mg (not administered)  zolpidem (AMBIEN) tablet 5 mg (not administered)  nicotine (NICODERM CQ - dosed in mg/24 hours) patch 21 mg (not administered)  ondansetron (ZOFRAN) tablet 4 mg (not administered)  alum & mag hydroxide-simeth (MAALOX/MYLANTA) 200-200-20 MG/5ML suspension 30 mL (not administered)  doxepin (SINEQUAN) capsule 25 mg (not administered)  escitalopram (LEXAPRO) tablet 10 mg (not administered)  gabapentin (NEURONTIN) capsule 200 mg (not administered)  hydrOXYzine (ATARAX/VISTARIL) tablet 25 mg (not administered)  multivitamin with minerals tablet 1 tablet (not administered)  pantoprazole (PROTONIX) EC tablet 40 mg (not administered)  LORazepam (ATIVAN) tablet 0-4 mg (not administered)    Followed by  LORazepam (ATIVAN) tablet 0-4 mg (not administered)  thiamine (VITAMIN B-1) tablet 100 mg (not administered)    Or  thiamine (B-1) injection 100 mg (not administered)    Patient Vitals for the past 24 hrs:  BP Temp Temp src Pulse Resp SpO2  06/13/14 1835 119/66 mmHg 98.1 F (36.7 C) Oral 101 18 96 %   18:55- TTS consultation. Usual meds,  ordered. Psychiatric holding orders.  20:20- per [facility]TS, the patient can be admitted to [facility], after his alcohol level gets to 160 mg/dl.  8:24 PM Reevaluation with update and discussion. After initial assessment and treatment, an updated evaluation reveals no change in status. WENTZ,ELLIOTT L     Labs Review Labs Reviewed  CBC - Abnormal; Notable for the following:    Hemoglobin 12.7 (*)    HCT 38.4 (*)    RDW 16.8 (*)    All other  components within normal limits  COMPREHENSIVE METABOLIC PANEL - Abnormal; Notable for the following:    Sodium 135 (*)    Chloride 92 (*)    Glucose, Bld 167 (*)    BUN 5 (*)    Total Protein 8.6 (*)    AST 111 (*)    ALT 80 (*)    Anion gap 17 (*)    All other components within normal limits  ETHANOL - Abnormal; Notable for the following:    Alcohol, Ethyl (B) 427 (*)    All other components within normal limits  URINE RAPID DRUG SCREEN (HOSP PERFORMED)    Imaging Review No results found.   EKG Interpretation None      MDM   Final diagnoses:  Alcoholism  Alcohol abuse    Nursing Notes Reviewed/ Care Coordinated Applicable Imaging Reviewed Interpretation of Laboratory Data incorporated into ED treatment   Plan- as per oncoming provider team    Richarda Blade, MD 06/14/14  1306 

## 2014-06-14 ENCOUNTER — Encounter (HOSPITAL_COMMUNITY): Payer: Self-pay | Admitting: Psychiatry

## 2014-06-14 DIAGNOSIS — F1010 Alcohol abuse, uncomplicated: Secondary | ICD-10-CM

## 2014-06-14 DIAGNOSIS — F1910 Other psychoactive substance abuse, uncomplicated: Secondary | ICD-10-CM

## 2014-06-14 DIAGNOSIS — F1994 Other psychoactive substance use, unspecified with psychoactive substance-induced mood disorder: Secondary | ICD-10-CM

## 2014-06-14 LAB — ETHANOL
Alcohol, Ethyl (B): 264 mg/dL — ABNORMAL HIGH (ref 0–11)
Alcohol, Ethyl (B): 147 mg/dL — ABNORMAL HIGH (ref 0–11)

## 2014-06-14 NOTE — ED Notes (Signed)
Spoke with patient regarding cream prescribed for a rash on 06-03-14. Stated was for excema on his face. Healed now.

## 2014-06-14 NOTE — Progress Notes (Signed)
Pt's referral along with application and updated ETOH level has been faxed to [facility]; beds available.   Rick Duff Disposition MHT

## 2014-06-14 NOTE — ED Notes (Signed)
Spoke with Elmyra Ricks at [facility], stated she had all the information about patient she needed. I explained that cream prescribed at D/C on 06-03-14 was for excema. Is healed now. She requested that I notify her when [facility] is there to transport.

## 2014-06-14 NOTE — ED Notes (Signed)
[facility] notified of transportation needed to [facility] in [place].

## 2014-06-14 NOTE — ED Notes (Signed)
[facility] here to transport to [facility]. Black suitcase and belongings bag x1 and plastic bag of meds from pharmacy given to driver. Ambulatory without difficulty. Denies pain. No complaints voiced. Nicole at [facility] notified of departure.

## 2014-06-14 NOTE — Consult Note (Signed)
[facility] Face-to-Face Psychiatry Consult   Reason for Consult:  Alcohol dependence/withdrawal Referring Physician:  EDP [name] [name] is an 53 y.o. male. Total Time spent with patient: 20 minutes  Assessment: AXIS I:  Alcohol Abuse, Substance Abuse and Substance Induced Mood Disorder AXIS II:  Deferred AXIS III:   Past Medical History  Diagnosis Date  . Alcohol abuse   . Sarcoidosis of lung   . Reflux   . Depression   . Anxiety   . Pancreatitis   . Carpal tunnel syndrome of right wrist   . GERD (gastroesophageal reflux disease)    AXIS IV:  other psychosocial or environmental problems, problems related to social environment and problems with primary support group AXIS V:  21-30 behavior considerably influenced by delusions or hallucinations OR serious impairment in judgment, communication OR inability to function in almost all areas  Plan:  Recommend psychiatric Inpatient admission when medically cleared.  Dr. Harrington Challenger assessed the patient and concurs with the plan.  Subjective:   [name] [name] is a 53 y.o. male patient admitted with alcohol dependence/detox.  HPI:  53 y.o. male.  -Clinician talked to Dr. Eulis Foster about need for TTS. He said that patient is a functioning alcoholic that wants to detox. Patient was at [facility] on 06/01/14 and before in June.  Patient does request detox from ETOH. His last drink was around 13:00 today. He said that he only drinks two 40's per day. His BAL was 427 at 19:07 which would indicate he is probably drinking more than stated amount. Patient denies any SI, HI or A/V hallucinations. He was last assessed on 07/05 and was in the observation unit at [facility].  Patient says that he has a hard time with quitting ETOH and can point to only a 4 month length of time when he was sober in either '06 or '07. Patient complains of some knee joint pain and occasional numbness in feet but is able to ambulate independently. He said that he has some medications that he takes for anxiety  but he has not taken them in over two months, preferring to drink instead. Patient's pharmacy is the [facility] near [facility]. At the current time patient has no outpatient provider.  -Patient care discussed with Dr. Eulis Foster. He was informed that [facility] had beds and would need a lab showing that the BAL was <160. Dr. Eulis Foster said that would be in 12 hours time. This clinician did call [facility] and they have beds available. Prescreen will be completed and the referral will be sent to them after 07:00 on 07/19. Patient does have a photo ID and is fine with the referral to [facility].  Today, the patient continues to desire detox for his alcohol dependence.  Facilities with bed availabilities are being sought for [name] [name]. HPI Elements:   Location:  generalized. Quality:  acute. Severity:  severe. Timing:  constant. Duration:  couple of weeks. Context:  stressors.  Past Psychiatric History: Past Medical History  Diagnosis Date  . Alcohol abuse   . Sarcoidosis of lung   . Reflux   . Depression   . Anxiety   . Pancreatitis   . Carpal tunnel syndrome of right wrist   . GERD (gastroesophageal reflux disease)     reports that he has quit smoking. His smoking use included Cigars. He does not have any smokeless tobacco history on file. He reports that he drinks alcohol. He reports that he does not use illicit drugs. Family History  Problem Relation Age of  Onset  . Diabetes Father   . Hypertension Father   . Hypertension Paternal Grandfather   . Alcohol abuse Paternal Grandfather   . Alcohol abuse Maternal Grandfather   . Alcohol abuse Maternal Grandmother   . Alcohol abuse Paternal Grandmother    Family History Substance Abuse: Yes, Describe: (Grandparents on both sides had drinking problems.) Family Supports: No Living Arrangements: Alone Can pt return to current living arrangement?: Yes Abuse/Neglect [facility]) Physical Abuse: Denies Verbal Abuse: Denies Sexual Abuse: Denies Allergies:   Allergies   Allergen Reactions  . Oxycodone Other (See Comments)    abd pain, stomach cramps     ACT Assessment Complete:  Yes:    Educational Status    Risk to Self: Risk to self Suicidal Ideation: No Suicidal Intent: No Is patient at risk for suicide?: No Suicidal Plan?: No Access to Means: No What has been your use of drugs/alcohol within the last 12 months?: ETOH abuse  Previous Attempts/Gestures: No How many times?: 0 Other Self Harm Risks: None Triggers for Past Attempts: None known Intentional Self Injurious Behavior: None Family Suicide History: Yes (Had a cousin that committed suicide) Recent stressful life event(s): Other (Comment) (Pt states his alcoholism as his main stressor.) Persecutory voices/beliefs?: No Depression: Yes Depression Symptoms: Despondent;Guilt Substance abuse history and/or treatment for substance abuse?: Yes (BAL 427   UDS negative) Suicide prevention information given to non-admitted patients: Not applicable  Risk to Others: Risk to Others Homicidal Ideation: No Thoughts of Harm to Others: No Current Homicidal Intent: No Current Homicidal Plan: No Access to Homicidal Means: No Identified Victim: No one History of harm to others?: No Assessment of Violence: None Noted Violent Behavior Description: Pt calm and cooperative Does patient have access to weapons?: No Criminal Charges Pending?: No Does patient have a court date: No  Abuse: Abuse/Neglect Assessment (Assessment to be complete while patient is alone) Physical Abuse: Denies Verbal Abuse: Denies Sexual Abuse: Denies Exploitation of patient/patient's resources: Denies Self-Neglect: Denies  Prior Inpatient Therapy: Prior Inpatient Therapy Prior Inpatient Therapy: Yes Prior Therapy Dates: 2015, 2014, 2009, 2007 Prior Therapy Facilty/Provider(s): [facility], [place] Reason for Treatment: SA/Detox/Rehab  Prior Outpatient Therapy: Prior Outpatient Therapy Prior Outpatient Therapy:  Yes Prior Therapy Dates: 2013-2014 Prior Therapy Facilty/Provider(s): [facility] Reason for Treatment: Depression/Anxiety  Additional Information: Additional Information 1:1 In Past 12 Months?: No CIRT Risk: No Elopement Risk: No Does patient have medical clearance?: Yes                  Objective: Blood pressure 133/89, pulse 100, temperature 99.3 F (37.4 C), temperature source Oral, resp. rate 16, SpO2 99.00%.There is no weight on file to calculate BMI. Results for orders placed during the hospital encounter of 06/13/14 (from the past 72 hour(s))  CBC     Status: Abnormal   Collection Time    06/13/14  7:07 PM      Result Value Ref Range   WBC 7.0  4.0 - 10.5 K/uL   RBC 4.76  4.22 - 5.81 MIL/uL   Hemoglobin 12.7 (*) 13.0 - 17.0 g/dL   HCT 38.4 (*) 39.0 - 52.0 %   MCV 80.7  78.0 - 100.0 fL   MCH 26.7  26.0 - 34.0 pg   MCHC 33.1  30.0 - 36.0 g/dL   RDW 16.8 (*) 11.5 - 15.5 %   Platelets 292  150 - 400 K/uL  COMPREHENSIVE METABOLIC PANEL     Status: Abnormal  Collection Time    06/13/14  7:07 PM      Result Value Ref Range   Sodium 135 (*) 137 - 147 mEq/L   Potassium 4.1  3.7 - 5.3 mEq/L   Chloride 92 (*) 96 - 112 mEq/L   CO2 26  19 - 32 mEq/L   Glucose, Bld 167 (*) 70 - 99 mg/dL   BUN 5 (*) 6 - 23 mg/dL   Creatinine, Ser 0.87  0.50 - 1.35 mg/dL   Calcium 9.2  8.4 - 10.5 mg/dL   Total Protein 8.6 (*) 6.0 - 8.3 g/dL   Albumin 4.0  3.5 - 5.2 g/dL   AST 111 (*) 0 - 37 U/L   ALT 80 (*) 0 - 53 U/L   Alkaline Phosphatase 46  39 - 117 U/L   Total Bilirubin 0.4  0.3 - 1.2 mg/dL   GFR calc non Af Amer >90  >90 mL/min   GFR calc Af Amer >90  >90 mL/min   Comment: (NOTE)     The eGFR has been calculated using the CKD EPI equation.     This calculation has not been validated in all clinical situations.     eGFR's persistently <90 mL/min signify possible Chronic Kidney     Disease.   Anion gap 17 (*) 5 - 15  ETHANOL     Status: Abnormal    Collection Time    06/13/14  7:07 PM      Result Value Ref Range   Alcohol, Ethyl (B) 427 (*) 0 - 11 mg/dL   Comment:            LOWEST DETECTABLE LIMIT FOR     SERUM ALCOHOL IS 11 mg/dL     FOR MEDICAL PURPOSES ONLY     CRITICAL RESULT CALLED TO, READ BACK BY AND VERIFIED WITH:     SPOKE WITH DUDLEY,F RN 2014 [phone] [place],N  URINE RAPID DRUG SCREEN (HOSP PERFORMED)     Status: None   Collection Time    06/13/14  7:19 PM      Result Value Ref Range   Opiates NONE DETECTED  NONE DETECTED   Cocaine NONE DETECTED  NONE DETECTED   Benzodiazepines NONE DETECTED  NONE DETECTED   Amphetamines NONE DETECTED  NONE DETECTED   Tetrahydrocannabinol NONE DETECTED  NONE DETECTED   Barbiturates NONE DETECTED  NONE DETECTED   Comment:            DRUG SCREEN FOR MEDICAL PURPOSES     ONLY.  IF CONFIRMATION IS NEEDED     FOR ANY PURPOSE, NOTIFY LAB     WITHIN 5 DAYS.                LOWEST DETECTABLE LIMITS     FOR URINE DRUG SCREEN     Drug Class       Cutoff (ng/mL)     Amphetamine      1000     Barbiturate      200     Benzodiazepine   536     Tricyclics       144     Opiates          300     Cocaine          300     THC              50  ETHANOL     Status: Abnormal   Collection Time  06/14/14  2:55 AM      Result Value Ref Range   Alcohol, Ethyl (B) 264 (*) 0 - 11 mg/dL   Comment:            LOWEST DETECTABLE LIMIT FOR     SERUM ALCOHOL IS 11 mg/dL     FOR MEDICAL PURPOSES ONLY  ETHANOL     Status: Abnormal   Collection Time    06/14/14  9:23 AM      Result Value Ref Range   Alcohol, Ethyl (B) 147 (*) 0 - 11 mg/dL   Comment:            LOWEST DETECTABLE LIMIT FOR     SERUM ALCOHOL IS 11 mg/dL     FOR MEDICAL PURPOSES ONLY   Labs are reviewed and are pertinent for no medical issues noted.  Current Facility-Administered Medications  Medication Dose Route Frequency Provider Last Rate Last Dose  . acetaminophen (TYLENOL) tablet 650 mg  650 mg Oral Q4H PRN Richarda Blade,  MD      . alum & mag hydroxide-simeth (MAALOX/MYLANTA) 200-200-20 MG/5ML suspension 30 mL  30 mL Oral PRN Richarda Blade, MD      . doxepin (SINEQUAN) capsule 25 mg  25 mg Oral QHS PRN Richarda Blade, MD      . escitalopram (LEXAPRO) tablet 10 mg  10 mg Oral Daily Richarda Blade, MD   10 mg at 06/14/14 0947  . gabapentin (NEURONTIN) capsule 200 mg  200 mg Oral BID Richarda Blade, MD   200 mg at 06/14/14 0947  . hydrOXYzine (ATARAX/VISTARIL) tablet 25 mg  25 mg Oral TID PRN Richarda Blade, MD   25 mg at 06/14/14 0947  . ibuprofen (ADVIL,MOTRIN) tablet 600 mg  600 mg Oral Q8H PRN Richarda Blade, MD      . LORazepam (ATIVAN) tablet 0-4 mg  0-4 mg Oral 4 times per day Richarda Blade, MD   1 mg at 06/14/14 0602   Followed by  . [START ON 06/15/2014] LORazepam (ATIVAN) tablet 0-4 mg  0-4 mg Oral Q12H Richarda Blade, MD      . multivitamin with minerals tablet 1 tablet  1 tablet Oral Daily Richarda Blade, MD   1 tablet at 06/14/14 0947  . nicotine (NICODERM CQ - dosed in mg/24 hours) patch 21 mg  21 mg Transdermal Daily PRN Richarda Blade, MD      . ondansetron [facility]) tablet 4 mg  4 mg Oral Q8H PRN Richarda Blade, MD      . pantoprazole (PROTONIX) EC tablet 40 mg  40 mg Oral Daily Richarda Blade, MD   40 mg at 06/14/14 0947  . thiamine (VITAMIN B-1) tablet 100 mg  100 mg Oral Daily Richarda Blade, MD   100 mg at 06/14/14 7017   Or  . thiamine (B-1) injection 100 mg  100 mg Intravenous Daily Richarda Blade, MD      . zolpidem (AMBIEN) tablet 5 mg  5 mg Oral QHS PRN Richarda Blade, MD       Current Outpatient Prescriptions  Medication Sig Dispense Refill  . doxepin (SINEQUAN) 25 MG capsule Take 1 capsule (25 mg total) by mouth at bedtime as needed (Insomnia).  30 capsule  0  . escitalopram (LEXAPRO) 10 MG tablet Take 1 tablet (10 mg total) by mouth daily. For depression  30 tablet  0  . gabapentin (NEURONTIN) 100 MG  capsule Take 2 capsules (200 mg total) by mouth 2 (two) times daily.  For Substance withdrawal syndrome  120 capsule  0  . hydrOXYzine (ATARAX/VISTARIL) 25 MG tablet Take 1 tablet (25 mg) three times daily as needed for anxiety  60 tablet  0  . Multiple Vitamin (MULTIVITAMIN WITH MINERALS) TABS tablet Take 1 tablet by mouth daily. For low vitamin  30 tablet  0  . pantoprazole (PROTONIX) 40 MG tablet Take 1 tablet (40 mg total) by mouth daily. For acid reflux  30 tablet  0  . triamcinolone (KENALOG) 0.025 % cream Apply topically 2 (two) times daily. For rashes  30 g  0  . vitamin B-12 (CYANOCOBALAMIN) 100 MCG tablet Take 1 tablet (100 mcg total) by mouth daily. For low B-12 vitamin  30 tablet  0    Psychiatric Specialty Exam:     Blood pressure 133/89, pulse 100, temperature 99.3 F (37.4 C), temperature source Oral, resp. rate 16, SpO2 99.00%.There is no weight on file to calculate BMI.  General Appearance: Casual  Eye Contact::  Fair  Speech:  Normal Rate  Volume:  Normal  Mood:  Depressed  Affect:  Congruent  Thought Process:  Coherent  Orientation:  Full (Time, Place, and Person)  Thought Content:  WDL  Suicidal Thoughts:  No  Homicidal Thoughts:  No  Memory:  Immediate;   Fair Recent;   Fair Remote;   Fair  Judgement:  Poor  Insight:  Fair  Psychomotor Activity:  Decreased  Concentration:  Fair  Recall:  [employer] of Knowledge:Fair  Language: Fair  Akathisia:  No  Handed:  Right  AIMS (if indicated):     Assets:  Desire for Improvement Leisure Time Physical Health Resilience  Sleep:      Musculoskeletal: Strength & Muscle Tone: within normal limits Gait & Station: normal Patient leans: N/A  Treatment Plan Summary: Daily contact with patient to assess and evaluate symptoms and progress in treatment Medication management; admit to inpatient hospitalization or detox facility for alcohol detox/dependency.  Waylan Boga, [place] 06/14/2014 12:19 PM Patient seen and I agree with assessment and plan Levonne Spiller MD

## 2014-06-14 NOTE — Progress Notes (Signed)
Pt accepted to [facility] per Elmyra Ricks.  Report number is [phone].   Rick Duff Disposition MHT

## 2014-06-27 ENCOUNTER — Encounter (HOSPITAL_COMMUNITY): Payer: Self-pay | Admitting: Emergency Medicine

## 2014-06-27 ENCOUNTER — Emergency Department (HOSPITAL_COMMUNITY)
Admission: EM | Admit: 2014-06-27 | Discharge: 2014-06-28 | Disposition: A | Discharge status: Home / Self Care | Payer: Self-pay | Attending: Emergency Medicine | Admitting: Emergency Medicine

## 2014-06-27 DIAGNOSIS — Z8669 Personal history of other diseases of the nervous system and sense organs: Secondary | ICD-10-CM | POA: Insufficient documentation

## 2014-06-27 DIAGNOSIS — Z87891 Personal history of nicotine dependence: Secondary | ICD-10-CM | POA: Insufficient documentation

## 2014-06-27 DIAGNOSIS — K219 Gastro-esophageal reflux disease without esophagitis: Secondary | ICD-10-CM | POA: Insufficient documentation

## 2014-06-27 DIAGNOSIS — F411 Generalized anxiety disorder: Secondary | ICD-10-CM | POA: Insufficient documentation

## 2014-06-27 DIAGNOSIS — F3289 Other specified depressive episodes: Secondary | ICD-10-CM | POA: Insufficient documentation

## 2014-06-27 DIAGNOSIS — R109 Unspecified abdominal pain: Secondary | ICD-10-CM | POA: Insufficient documentation

## 2014-06-27 DIAGNOSIS — F10920 Alcohol use, unspecified with intoxication, uncomplicated: Secondary | ICD-10-CM

## 2014-06-27 DIAGNOSIS — F1010 Alcohol abuse, uncomplicated: Secondary | ICD-10-CM

## 2014-06-27 DIAGNOSIS — F10229 Alcohol dependence with intoxication, unspecified: Secondary | ICD-10-CM | POA: Insufficient documentation

## 2014-06-27 DIAGNOSIS — F329 Major depressive disorder, single episode, unspecified: Secondary | ICD-10-CM | POA: Insufficient documentation

## 2014-06-27 DIAGNOSIS — Z8619 Personal history of other infectious and parasitic diseases: Secondary | ICD-10-CM | POA: Insufficient documentation

## 2014-06-27 LAB — RAPID URINE DRUG SCREEN, HOSP PERFORMED
Amphetamines: NOT DETECTED
Barbiturates: NOT DETECTED
Benzodiazepines: NOT DETECTED
Cocaine: NOT DETECTED
Opiates: NOT DETECTED
Tetrahydrocannabinol: NOT DETECTED

## 2014-06-27 LAB — COMPREHENSIVE METABOLIC PANEL
ALT: 64 U/L — ABNORMAL HIGH (ref 0–53)
AST: 81 U/L — ABNORMAL HIGH (ref 0–37)
Albumin: 3.9 g/dL (ref 3.5–5.2)
Alkaline Phosphatase: 43 U/L (ref 39–117)
Anion gap: 18 — ABNORMAL HIGH (ref 5–15)
BUN: 5 mg/dL — ABNORMAL LOW (ref 6–23)
CO2: 24 mEq/L (ref 19–32)
Calcium: 9 mg/dL (ref 8.4–10.5)
Chloride: 89 mEq/L — ABNORMAL LOW (ref 96–112)
Creatinine, Ser: 0.94 mg/dL (ref 0.50–1.35)
GFR calc Af Amer: >90 mL/min (ref >90)
GFR calc non Af Amer: >90 mL/min (ref >90)
Glucose, Bld: 156 mg/dL — ABNORMAL HIGH (ref 70–99)
Potassium: 4.2 mEq/L (ref 3.7–5.3)
Sodium: 131 mEq/L — ABNORMAL LOW (ref 137–147)
Total Bilirubin: 0.5 mg/dL (ref 0.3–1.2)
Total Protein: 8.4 g/dL — ABNORMAL HIGH (ref 6.0–8.3)

## 2014-06-27 LAB — ETHANOL: Alcohol, Ethyl (B): 422 mg/dL (ref 0–11)

## 2014-06-27 LAB — URINALYSIS, ROUTINE W REFLEX MICROSCOPIC
Bilirubin Urine: NEGATIVE
Glucose, UA: NEGATIVE mg/dL
Hgb urine dipstick: NEGATIVE
Ketones, ur: NEGATIVE mg/dL
Leukocytes, UA: NEGATIVE
Nitrite: NEGATIVE
Protein, ur: NEGATIVE mg/dL
Specific Gravity, Urine: 1.005 (ref 1.005–1.030)
Urobilinogen, UA: 1 mg/dL (ref 0.0–1.0)
pH: 5.5 (ref 5.0–8.0)

## 2014-06-27 LAB — CBC WITH DIFFERENTIAL/PLATELET
Basophils Absolute: 0.1 10*3/uL (ref 0.0–0.1)
Basophils Relative: 1 % (ref 0–1)
Eosinophils Absolute: 0.1 10*3/uL (ref 0.0–0.7)
Eosinophils Relative: 2 % (ref 0–5)
HCT: 37.2 % — ABNORMAL LOW (ref 39.0–52.0)
Hemoglobin: 12.4 g/dL — ABNORMAL LOW (ref 13.0–17.0)
Lymphocytes Relative: 38 % (ref 12–46)
Lymphs Abs: 1.7 10*3/uL (ref 0.7–4.0)
MCH: 26.7 pg (ref 26.0–34.0)
MCHC: 33.3 g/dL (ref 30.0–36.0)
MCV: 80.2 fL (ref 78.0–100.0)
Monocytes Absolute: 0.5 10*3/uL (ref 0.1–1.0)
Monocytes Relative: 12 % (ref 3–12)
Neutro Abs: 2.2 10*3/uL (ref 1.7–7.7)
Neutrophils Relative %: 47 % (ref 43–77)
Platelets: 191 10*3/uL (ref 150–400)
RBC: 4.64 MIL/uL (ref 4.22–5.81)
RDW: 16.3 % — ABNORMAL HIGH (ref 11.5–15.5)
WBC: 4.5 10*3/uL (ref 4.0–10.5)

## 2014-06-27 LAB — ACETAMINOPHEN LEVEL: Acetaminophen (Tylenol), Serum: <15 ug/mL (ref 10–30)

## 2014-06-27 LAB — SALICYLATE LEVEL: Salicylate Lvl: <2 mg/dL — ABNORMAL LOW (ref 2.8–20.0)

## 2014-06-27 MED ORDER — LORAZEPAM 1 MG PO TABS: 0.0000 mg | ORAL_TABLET | Freq: Two times a day (BID) | ORAL | Status: DC

## 2014-06-27 MED ORDER — THIAMINE HCL 100 MG/ML IJ SOLN
100.0000 mg | Freq: Every day | INTRAMUSCULAR | Status: DC
Start: 2014-06-27 — End: 2014-06-28

## 2014-06-27 MED ORDER — ONDANSETRON HCL 4 MG PO TABS
4.0000 mg | ORAL_TABLET | Freq: Three times a day (TID) | ORAL | Status: DC | PRN
  Administered 2014-06-28: 4 mg via ORAL
  Filled 2014-06-27: qty 1

## 2014-06-27 MED ORDER — LORAZEPAM 1 MG PO TABS
0.0000 mg | ORAL_TABLET | Freq: Four times a day (QID) | ORAL | Status: DC
  Administered 2014-06-27 – 2014-06-28 (×2): 1 mg via ORAL
  Filled 2014-06-27 (×2): qty 1

## 2014-06-27 MED ORDER — VITAMIN B-1 100 MG PO TABS
100.0000 mg | ORAL_TABLET | Freq: Every day | ORAL | Status: DC
  Administered 2014-06-27: 100 mg via ORAL
  Filled 2014-06-27: qty 1

## 2014-06-27 NOTE — ED Notes (Addendum)
Pt requested "something to help him relax.". Was advised that he has alcohol on board and after his labs result a decision will be made then whether to admin meds or not. Pt is not having tremors or hallucinations, has ambulated to BR and back to hall bed w/o assistance. Pt is resting quietly at this time and in NAD. PA Loma Sousa made aware

## 2014-06-27 NOTE — ED Notes (Signed)
Pt approached the desk and stated he had a chronic bump on his left posterior head that he believes is effecting his condition.

## 2014-06-27 NOTE — ED Notes (Signed)
Pt reports that he is here for detox. Pt states that his last drink was right before he checked in today. Pt reports that he has a hx of pancreatitis, wants to stop drinking because ," I'm tired of it." Pt reports drinking two 40's per day. Pt thinks that he may have alcohol poisoning because he never eats when he drinks. Pt is A&O and in NAD

## 2014-06-27 NOTE — ED Provider Notes (Signed)
CSN: [id number]     Arrival date & time 06/27/14  1733 History   First MD Initiated Contact with Patient 06/27/14 1754     Chief Complaint  Patient presents with  . Alcohol Problem   Patient is a 53 y.o. male presenting with alcohol problem.  Alcohol Problem Associated symptoms include abdominal pain. Pertinent negatives include no chills, fatigue, fever, nausea or vomiting.    Patient is a 53 y.o. Male who presents to the ED requesting alcohol detox.  Patient states that he is an alcoholic and generally drinks 2-3 40 oz beers per week.  Patient states that his last drink was today right before he walked in the door.  Patient states that he knows that he has a problem and was in a detox facility in [place] which he liked but only stayed for 3-4 days because he didn't think he needed any more treatment.  Patient states that he is living in an apartment in [place] by himself, but is soon going to lose his home.  Patient denies any homicidal or suicidal ideation at this time.  He does have a history of chronic pancreatitis and does have some mild epigastric pain.  He denies drug use at this time.    Past Medical History  Diagnosis Date  . Alcohol abuse   . Sarcoidosis of lung   . Reflux   . Depression   . Anxiety   . Pancreatitis   . Carpal tunnel syndrome of right wrist   . GERD (gastroesophageal reflux disease)    Past Surgical History  Procedure Laterality Date  . Knee arthroscopy    . Carpel tunnel release    . Elbow surgery     Family History  Problem Relation Age of Onset  . Diabetes Father   . Hypertension Father   . Hypertension Paternal Grandfather   . Alcohol abuse Paternal Grandfather   . Alcohol abuse Maternal Grandfather   . Alcohol abuse Maternal Grandmother   . Alcohol abuse Paternal Grandmother    History  Substance Use Topics  . Smoking status: Former Smoker    Types: Cigars  . Smokeless tobacco: Not on file  . Alcohol Use: 0.0 oz/week     Comment:  Patient drinks at minimum two 40oz daily     Review of Systems  Constitutional: Negative for fever, chills and fatigue.  Respiratory: Negative for chest tightness and shortness of breath.   Gastrointestinal: Positive for abdominal pain. Negative for nausea, vomiting, diarrhea and constipation.  Genitourinary: Negative for dysuria, urgency, frequency, hematuria, decreased urine volume and difficulty urinating.  Neurological: Negative for seizures.  All other systems reviewed and are negative.     Allergies  Oxycodone  Home Medications   Prior to Admission medications   Medication Sig Start Date End Date Taking? Authorizing Provider  doxepin (SINEQUAN) 25 MG capsule Take 1 capsule (25 mg total) by mouth at bedtime as needed (Insomnia). 06/03/14  Yes Encarnacion Slates, NP  escitalopram (LEXAPRO) 10 MG tablet Take 1 tablet (10 mg total) by mouth daily. For depression 06/03/14  Yes Encarnacion Slates, NP  gabapentin (NEURONTIN) 100 MG capsule Take 2 capsules (200 mg total) by mouth 2 (two) times daily. For Substance withdrawal syndrome 06/03/14  Yes Encarnacion Slates, NP  hydrOXYzine (ATARAX/VISTARIL) 25 MG tablet Take 1 tablet (25 mg) three times daily as needed for anxiety 06/03/14  Yes Encarnacion Slates, NP  Multiple Vitamin (MULTIVITAMIN WITH MINERALS) TABS tablet Take 1 tablet by mouth  daily. For low vitamin 06/03/14  Yes Encarnacion Slates, NP  pantoprazole (PROTONIX) 40 MG tablet Take 1 tablet (40 mg total) by mouth daily. For acid reflux 06/03/14  Yes Encarnacion Slates, NP  triamcinolone (KENALOG) 0.025 % cream Apply topically 2 (two) times daily. For rashes 06/03/14  Yes Encarnacion Slates, NP  vitamin B-12 (CYANOCOBALAMIN) 100 MCG tablet Take 1 tablet (100 mcg total) by mouth daily. For low B-12 vitamin 06/03/14  Yes Encarnacion Slates, NP   BP 118/78  Pulse 99  Temp(Src) 98 F (36.7 C) (Oral)  Resp 17  SpO2 95% Physical Exam  Nursing note and vitals reviewed. Constitutional: He is oriented to person, place, and time. He  appears well-developed and well-nourished. No distress.  HENT:  Head: Normocephalic and atraumatic.  Mouth/Throat: Oropharynx is clear and moist. No oropharyngeal exudate.  Eyes: Conjunctivae and EOM are normal. Pupils are equal, round, and reactive to light. No scleral icterus.  Neck: Normal range of motion. Neck supple. No JVD present. No thyromegaly present.  Cardiovascular: Normal rate, regular rhythm, normal heart sounds and intact distal pulses.  Exam reveals no gallop and no friction rub.   No murmur heard. Pulmonary/Chest: Effort normal and breath sounds normal. No respiratory distress. He has no wheezes. He has no rales. He exhibits no tenderness.  Abdominal: Soft. Normal appearance and bowel sounds are normal. He exhibits no distension and no mass. There is tenderness in the epigastric area. There is no rebound and no guarding.  Musculoskeletal: Normal range of motion.  Lymphadenopathy:    He has no cervical adenopathy.  Neurological: He is alert and oriented to person, place, and time. No cranial nerve deficit. Coordination normal.  Skin: Skin is warm and dry. He is not diaphoretic.  Psychiatric: He has a normal mood and affect. His behavior is normal. Judgment and thought content normal.    ED Course  Procedures (including critical care time) Labs Review Labs Reviewed  CBC WITH DIFFERENTIAL - Abnormal; Notable for the following:    Hemoglobin 12.4 (*)    HCT 37.2 (*)    RDW 16.3 (*)    All other components within normal limits  COMPREHENSIVE METABOLIC PANEL - Abnormal; Notable for the following:    Sodium 131 (*)    Chloride 89 (*)    Glucose, Bld 156 (*)    BUN 5 (*)    Total Protein 8.4 (*)    AST 81 (*)    ALT 64 (*)    Anion gap 18 (*)    All other components within normal limits  SALICYLATE LEVEL - Abnormal; Notable for the following:    Salicylate Lvl <6.3 (*)    All other components within normal limits  ETHANOL - Abnormal; Notable for the following:     Alcohol, Ethyl (B) 422 (*)    All other components within normal limits  ACETAMINOPHEN LEVEL  URINALYSIS, ROUTINE W REFLEX MICROSCOPIC  URINE RAPID DRUG SCREEN (HOSP PERFORMED)  ETHANOL    Imaging Review No results found.   EKG Interpretation None      MDM   Final diagnoses:  Alcohol dependence with intoxication with complication   Patient is a 53 y.o. Alcoholic male who presents to the ED for detoxification.  Basic labs were drawn here in the ED and are pending at this time.  Patient is currently intoxicated with an alcohol level of 422.  There is mild anemia which appears to be stable at his baseline.  Patient is  also hyponatremic and hypochloremic.  There is a mild bump in his LFTs.  Consult to TTS has been placed.  TTS states that the patient can be discharged to follow-up with detox at an inpatient facility.  Repeat EtOH level is scheduled for 0500 at this time.  Patient to be signed out with Dr. Vanita Panda.       Cherylann Parr, PA-C 06/28/14 0126

## 2014-06-27 NOTE — BH Assessment (Signed)
Assessment Note  [name] [name] is an 53 y.o. male who presents at [facility] Emergency Department with the chief complaint of alcohol detox. Patient reports that he consumes at minimum two 40 ounce beers daily. Patient states that he has been excessively drinking for several years to no alleviation and that he desires to now obtain sobriety. Patient reports his longest period of sobriety was for 5 months back in 2009. Patient endorses depressive symptoms such as: feelings of guilt, worthlessness/self pity, and exacerbated social isolation. Patient reports he has lost 20 pounds and that he has a poor appetite in addition to decreased hours of sleep. Patient reports he has been "drinking heavy" since the age of 8. Patient denies any withdrawal symptoms but does state that he experiences moments of anxiety.  Patient's last drink was today prior to his ED admission during which his BAL was 422. Patient reports a family history of substance abuse as he stated his grandparents suffered from alcoholism as well. Patient has past treatment for detox at [facility], [facility], and [facility]. Patient currently desires detox from alcohol and potentially residential services afterwards to obtain sobriety once again. Patient denies SI/HI/AVH at this time.   Axis I: Alcohol Use Disorder, Severe Axis II: Deferred Axis III:  Past Medical History  Diagnosis Date  . Alcohol abuse   . Sarcoidosis of lung   . Reflux   . Depression   . Anxiety   . Pancreatitis   . Carpal tunnel syndrome of right wrist   . GERD (gastroesophageal reflux disease)    Axis IV: economic problems, occupational problems, other psychosocial or environmental problems, problems related to social environment and problems with primary support group Axis V: 51-60 moderate symptoms  Past Medical History:  Past Medical History  Diagnosis Date  . Alcohol abuse   . Sarcoidosis of lung   . Reflux   . Depression   . Anxiety   . Pancreatitis   . Carpal tunnel  syndrome of right wrist   . GERD (gastroesophageal reflux disease)     Past Surgical History  Procedure Laterality Date  . Knee arthroscopy    . Carpel tunnel release    . Elbow surgery      Family History:  Family History  Problem Relation Age of Onset  . Diabetes Father   . Hypertension Father   . Hypertension Paternal Grandfather   . Alcohol abuse Paternal Grandfather   . Alcohol abuse Maternal Grandfather   . Alcohol abuse Maternal Grandmother   . Alcohol abuse Paternal Grandmother     Social History:  reports that he has quit smoking. His smoking use included Cigars. He does not have any smokeless tobacco history on file. He reports that he drinks alcohol. He reports that he does not use illicit drugs.  Additional Social History:  Alcohol / Drug Use History of alcohol / drug use?: Yes Longest period of sobriety (when/how long): 5 months in 2007 Negative Consequences of Use: Personal relationships;Financial Substance #1 Name of Substance 1: ETOH 1 - Age of First Use: 17 1 - Amount (size/oz): 40 oz of beer 1 - Frequency: daily 1 - Duration: years 1 - Last Use / Amount: 06/27/2014: Patient reports he drank two 40oz beers. BAL is 422 on admission   CIWA: CIWA-Ar BP: 118/78 mmHg Pulse Rate: 99 Nausea and Vomiting: 2 Tactile Disturbances: very mild itching, pins and needles, burning or numbness Tremor: not visible, but can be felt fingertip to fingertip Auditory Disturbances: not present Paroxysmal Sweats:  barely perceptible sweating, palms moist Visual Disturbances: very mild sensitivity Anxiety: three Headache, Fullness in Head: very mild Agitation: two Orientation and Clouding of Sensorium: oriented and can do serial additions CIWA-Ar Total: 12 COWS:    Allergies:  Allergies  Allergen Reactions  . Oxycodone Other (See Comments)    abd pain, stomach cramps     Home Medications:  (Not in a hospital admission)  OB/GYN Status:  No LMP for male  patient.  General Assessment Data Location of Assessment: [facility] ED Is this a Tele or Face-to-Face Assessment?: Face-to-Face Is this an Initial Assessment or a Re-assessment for this encounter?: Initial Assessment Living Arrangements: Alone Can pt return to current living arrangement?: Yes Admission Status: Voluntary Is patient capable of signing voluntary admission?: Yes Transfer from: [facility] Referral Source: Self/Family/Friend     [place] Living Arrangements: Alone Name of Psychiatrist: None Name of Therapist: None     Risk to self with the past 6 months Suicidal Ideation: No Suicidal Intent: No Is patient at risk for suicide?: No Suicidal Plan?: No Access to Means: No What has been your use of drugs/alcohol within the last 12 months?: ETOH daily Previous Attempts/Gestures: No How many times?: 0 Other Self Harm Risks: None Triggers for Past Attempts: None known Intentional Self Injurious Behavior: None Family Suicide History: Unknown Recent stressful life event(s): Other (Comment) Persecutory voices/beliefs?: No Depression: Yes Depression Symptoms: Despondent;Isolating;Fatigue;Guilt;Feeling worthless/self pity Substance abuse history and/or treatment for substance abuse?: Yes Suicide prevention information given to non-admitted patients: Not applicable  Risk to Others within the past 6 months Homicidal Ideation: No Thoughts of Harm to Others: No Current Homicidal Intent: No Current Homicidal Plan: No Access to Homicidal Means: No Identified Victim: No one History of harm to others?: No Assessment of Violence: None Noted Violent Behavior Description: Pt is calm and cooperative Does patient have access to weapons?: No Criminal Charges Pending?: No Does patient have a court date: No  Psychosis Hallucinations: None noted Delusions: None noted  Mental Status Report Appear/Hygiene: Disheveled Eye Contact: Good Motor Activity: Freedom of  movement Speech: Logical/coherent Level of Consciousness: Alert Mood: Depressed;Anxious Affect: Anxious;Depressed Anxiety Level: Minimal Thought Processes: Coherent;Relevant Judgement: Impaired Orientation: Person;Place;Time;Situation Obsessive Compulsive Thoughts/Behaviors: None  Cognitive Functioning Concentration: Normal Memory: Recent Intact;Remote Intact IQ: Average Insight: Poor Impulse Control: Poor Appetite: Poor Weight Loss: 20 Weight Gain: 0 Sleep: Decreased Vegetative Symptoms: None  ADLScreening [facility] Assessment Services) Patient's cognitive ability adequate to safely complete daily activities?: Yes Patient able to express need for assistance with ADLs?: Yes Independently performs ADLs?: Yes (appropriate for developmental age)  Prior Inpatient Therapy Prior Inpatient Therapy: Yes Prior Therapy Dates: 2015, 2014, 2009, 2007 Prior Therapy Facilty/Provider(s): [facility], [facility], [facility] Reason for Treatment: SA/Detox  Prior Outpatient Therapy Prior Outpatient Therapy: Yes Prior Therapy Dates: 2013-2014 Prior Therapy Facilty/Provider(s): [facility] Reason for Treatment: Depression  ADL Screening (condition at time of admission) Patient's cognitive ability adequate to safely complete daily activities?: Yes Is the patient deaf or have difficulty hearing?: No Does the patient have difficulty seeing, even when wearing glasses/contacts?: No Does the patient have difficulty concentrating, remembering, or making decisions?: No Patient able to express need for assistance with ADLs?: Yes Does the patient have difficulty dressing or bathing?: No Independently performs ADLs?: Yes (appropriate for developmental age) Does the patient have difficulty walking or climbing stairs?: No Weakness of Legs: None Weakness of Arms/Hands: None  Home Assistive Devices/Equipment Home Assistive Devices/Equipment: None  Therapy Consults (therapy  consults require a physician  order) PT Evaluation Needed: No OT Evalulation Needed: No SLP Evaluation Needed: No Abuse/Neglect Assessment (Assessment to be complete while patient is alone) Physical Abuse: Denies Verbal Abuse: Denies Sexual Abuse: Denies Exploitation of patient/patient's resources: Denies Self-Neglect: Denies Values / Beliefs Cultural Requests During Hospitalization: None Spiritual Requests During Hospitalization: None Consults Spiritual Care Consult Needed: No Social Work Consult Needed: No      Additional Information 1:1 In Past 12 Months?: No CIRT Risk: No Elopement Risk: No Does patient have medical clearance?: No     Disposition: Patient meets criteria for inpatient detox. Pt has reached maximum benefit from [facility] detox protocol per Darlyne Russian, PA-C. [facility] to seek placement through outside facilities.  Disposition Initial Assessment Completed for this Encounter: Yes Disposition of Patient: Other dispositions Other disposition(s): Other (Comment)  On Site Evaluation by:   Reviewed with Physician:    Milford Cage, GREGORY C 06/27/2014 9:07 PM

## 2014-06-27 NOTE — Consult Note (Signed)
  TTS eval/presentation-pt with admitting Etoh 422.Has been in [facility] 3 times past  3-4 months.No SI/HI.[facility]ants [facility] -refer to [facility] when blood alcohol 200 or less and medically clear

## 2014-06-27 NOTE — ED Notes (Signed)
Critical EOTH called by Adonis Brook (in lab) ETOH 422 Primary nurse notified

## 2014-06-27 NOTE — ED Notes (Signed)
Pt presents to ed with c/o alcohol problem, he sts he would like to be sent to place he was sent to last time he was here (in [place]). Pt appears intoxicated, sts last drink prior to walking in the department.

## 2014-06-28 LAB — ETHANOL
Alcohol, Ethyl (B): 291 mg/dL — ABNORMAL HIGH (ref 0–11)
Alcohol, Ethyl (B): 199 mg/dL — ABNORMAL HIGH (ref 0–11)

## 2014-06-28 NOTE — ED Notes (Signed)
Dr Rulon Abide will eval

## 2014-06-28 NOTE — ED Notes (Addendum)
Dr Ashok Cordia into see, dc after breakfast

## 2014-06-28 NOTE — ED Notes (Signed)
CSW into see 

## 2014-06-28 NOTE — ED Provider Notes (Addendum)
Pt resting comfortably, watching tv. Normal vitals. No tremor or shakes. No sweats. No sign of etoh withdrawal requiring inpatient/medical treatment.   Pt has been through several rehab/detox programs previously this year, including within the past month, and has chosen to not maintain sobriety.    Psych team indicates currently no inpt rehab beds available at [facility], [facility], or [facility].  Pt homeless, ?secondary gain from recurrent ED visits/stays.   Pt appears stable for d/c.     Mirna Mires, MD 06/28/14 [phone]

## 2014-06-28 NOTE — ED Notes (Addendum)
Written dc instructions reviewed with pt.  Pt encouraged to follow up w/ OP and residential treatment program information he has been given.  Pt verbalized understanding.  Nausea improved, Pt drank gaterade prior to dc. Buss pass give,  Pt ambulatory to dc window w/o difficulty w/ mHt, belongings returned after leaving the unit.

## 2014-06-28 NOTE — ED Provider Notes (Signed)
9:47 AM Called to evaluate pt who states he feel's bad and feels like he is withdrawing. He appears comfoortable sitting in bed, he is not diaphoretic, he has had no vomiting. He reports tremor, though when distracted he has no tremoe. He states he feels weak, but has not tried to eat any of his breakfast. In chart review, he has had multiple ED visits for ETOH intox, requesting admission for detox. He continues to drink once dischanged and does not appeared to have benefited from inpt treatment. I feel outpt treatment is a better option for his at this point. I have reviewed his local options as well as residential treatment programs [place]. Will d/c.   1. Alcohol intoxication, uncomplicated   2. Chronic alcohol abuse      Neta Ehlers, MD 06/28/14 [phone]

## 2014-06-28 NOTE — ED Provider Notes (Signed)
Patient in no distress. Patient's alcohol level has decreased appropriately, and he is now medically clear for further assistance in placement.  Psychiatry requests transfer to the behavioral health emergency department section for facilitation of transfer.  Carmin Muskrat, MD 06/28/14 [phone]

## 2014-06-28 NOTE — ED Provider Notes (Signed)
Medical screening examination/treatment/procedure(s) were performed by non-physician practitioner and as supervising physician I was immediately available for consultation/collaboration.   EKG Interpretation None       Threasa Beards, MD 06/28/14 [phone]

## 2014-06-28 NOTE — Discharge Instructions (Signed)
Do not drink alcohol.  See resource guide below for community, mental health, and alcohol rehab resources. If mental health issues, follow up at [facility]. Return to ER if worse, new symptoms, fevers, medical emergency, other concern.      Alcohol Intoxication Alcohol intoxication occurs when the amount of alcohol that a person has consumed impairs his or her ability to mentally and physically function. Alcohol directly impairs the normal chemical activity of the brain. Drinking large amounts of alcohol can lead to changes in mental function and behavior, and it can cause many physical effects that can be harmful.  Alcohol intoxication can range in severity from mild to very severe. Various factors can affect the level of intoxication that occurs, such as the person's age, gender, weight, frequency of alcohol consumption, and the presence of other medical conditions (such as diabetes, seizures, or heart conditions). Dangerous levels of alcohol intoxication may occur when people drink large amounts of alcohol in a short period (binge drinking). Alcohol can also be especially dangerous when combined with certain prescription medicines or "recreational" drugs. SIGNS AND SYMPTOMS Some common signs and symptoms of mild alcohol intoxication include:  Loss of coordination.  Changes in mood and behavior.  Impaired judgment.  Slurred speech. As alcohol intoxication progresses to more severe levels, other signs and symptoms will appear. These may include:  Vomiting.  Confusion and impaired memory.  Slowed breathing.  Seizures.  Loss of consciousness. DIAGNOSIS  Your health care provider will take a medical history and perform a physical exam. You will be asked about the amount and type of alcohol you have consumed. Blood tests will be done to measure the concentration of alcohol in your blood. In many places, your blood alcohol level must be lower than 80 mg/dL (0.08%) to legally drive.  However, many dangerous effects of alcohol can occur at much lower levels.  TREATMENT  People with alcohol intoxication often do not require treatment. Most of the effects of alcohol intoxication are temporary, and they go away as the alcohol naturally leaves the body. Your health care provider will monitor your condition until you are stable enough to go home. Fluids are sometimes given through an IV access tube to help prevent dehydration.  HOME CARE INSTRUCTIONS  Do not drive after drinking alcohol.  Stay hydrated. Drink enough water and fluids to keep your urine clear or pale yellow. Avoid caffeine.   Only take over-the-counter or prescription medicines as directed by your health care provider.  SEEK MEDICAL CARE IF:   You have persistent vomiting.   You do not feel better after a few days.  You have frequent alcohol intoxication. Your health care provider can help determine if you should see a substance use treatment counselor. SEEK IMMEDIATE MEDICAL CARE IF:   You become shaky or tremble when you try to stop drinking.   You shake uncontrollably (seizure).   You throw up (vomit) blood. This may be bright red or may look like black coffee grounds.   You have blood in your stool. This may be bright red or may appear as a black, tarry, bad smelling stool.   You become lightheaded or faint.  MAKE SURE YOU:   Understand these instructions.  Will watch your condition.  Will get help right away if you are not doing well or get worse. Document Released: 08/23/2005 Document Revised: 07/16/2013 Document Reviewed: 04/18/2013 [facility] Patient Information 2015 [place], [place]. This information is not intended to replace advice given to you by your health  care provider. Make sure you discuss any questions you have with your health care provider.  Alcohol Intoxication Alcohol intoxication occurs when the amount of alcohol that a person has consumed impairs his or her ability to  mentally and physically function. Alcohol directly impairs the normal chemical activity of the brain. Drinking large amounts of alcohol can lead to changes in mental function and behavior, and it can cause many physical effects that can be harmful.  Alcohol intoxication can range in severity from mild to very severe. Various factors can affect the level of intoxication that occurs, such as the person's age, gender, weight, frequency of alcohol consumption, and the presence of other medical conditions (such as diabetes, seizures, or heart conditions). Dangerous levels of alcohol intoxication may occur when people drink large amounts of alcohol in a short period (binge drinking). Alcohol can also be especially dangerous when combined with certain prescription medicines or "recreational" drugs. SIGNS AND SYMPTOMS Some common signs and symptoms of mild alcohol intoxication include:  Loss of coordination.  Changes in mood and behavior.  Impaired judgment.  Slurred speech. As alcohol intoxication progresses to more severe levels, other signs and symptoms will appear. These may include:  Vomiting.  Confusion and impaired memory.  Slowed breathing.  Seizures.  Loss of consciousness. DIAGNOSIS  Your health care provider will take a medical history and perform a physical exam. You will be asked about the amount and type of alcohol you have consumed. Blood tests will be done to measure the concentration of alcohol in your blood. In many places, your blood alcohol level must be lower than 80 mg/dL (0.08%) to legally drive. However, many dangerous effects of alcohol can occur at much lower levels.  TREATMENT  People with alcohol intoxication often do not require treatment. Most of the effects of alcohol intoxication are temporary, and they go away as the alcohol naturally leaves the body. Your health care provider will monitor your condition until you are stable enough to go home. Fluids are sometimes  given through an IV access tube to help prevent dehydration.  HOME CARE INSTRUCTIONS  Do not drive after drinking alcohol.  Stay hydrated. Drink enough water and fluids to keep your urine clear or pale yellow. Avoid caffeine.   Only take over-the-counter or prescription medicines as directed by your health care provider.  SEEK MEDICAL CARE IF:   You have persistent vomiting.   You do not feel better after a few days.  You have frequent alcohol intoxication. Your health care provider can help determine if you should see a substance use treatment counselor. SEEK IMMEDIATE MEDICAL CARE IF:   You become shaky or tremble when you try to stop drinking.   You shake uncontrollably (seizure).   You throw up (vomit) blood. This may be bright red or may look like black coffee grounds.   You have blood in your stool. This may be bright red or may appear as a black, tarry, bad smelling stool.   You become lightheaded or faint.  MAKE SURE YOU:   Understand these instructions.  Will watch your condition.  Will get help right away if you are not doing well or get worse. Document Released: 08/23/2005 Document Revised: 07/16/2013 Document Reviewed: 04/18/2013 [facility] Patient Information 2015 [place], [place]. This information is not intended to replace advice given to you by your health care provider. Make sure you discuss any questions you have with your health care provider.      Alcohol Use Disorder Alcohol use  disorder is a mental disorder. It is not a one-time incident of heavy drinking. Alcohol use disorder is the excessive and uncontrollable use of alcohol over time that leads to problems with functioning in one or more areas of daily living. People with this disorder risk harming themselves and others when they drink to excess. Alcohol use disorder also can cause other mental disorders, such as mood and anxiety disorders, and serious physical problems. People with alcohol use  disorder often misuse other drugs.  Alcohol use disorder is common and widespread. Some people with this disorder drink alcohol to cope with or escape from negative life events. Others drink to relieve chronic pain or symptoms of mental illness. People with a family history of alcohol use disorder are at higher risk of losing control and using alcohol to excess.  SYMPTOMS  Signs and symptoms of alcohol use disorder may include the following:   Consumption ofalcohol inlarger amounts or over a longer period of time than intended.  Multiple unsuccessful attempts to cutdown or control alcohol use.   A great deal of time spent obtaining alcohol, using alcohol, or recovering from the effects of alcohol (hangover).  A strong desire or urge to use alcohol (cravings).   Continued use of alcohol despite problems at work, school, or home because of alcohol use.   Continued use of alcohol despite problems in relationships because of alcohol use.  Continued use of alcohol in situations when it is physically hazardous, such as driving a car.  Continued use of alcohol despite awareness of a physical or psychological problem that is likely related to alcohol use. Physical problems related to alcohol use can involve the brain, heart, liver, stomach, and intestines. Psychological problems related to alcohol use include intoxication, depression, anxiety, psychosis, delirium, and dementia.   The need for increased amounts of alcohol to achieve the same desired effect, or a decreased effect from the consumption of the same amount of alcohol (tolerance).  Withdrawal symptoms upon reducing or stopping alcohol use, or alcohol use to reduce or avoid withdrawal symptoms. Withdrawal symptoms include:  Racing heart.  Hand tremor.  Difficulty sleeping.  Nausea.  Vomiting.  Hallucinations.  Restlessness.  Seizures. DIAGNOSIS Alcohol use disorder is diagnosed through an assessment by your health  care provider. Your health care provider may start by asking three or four questions to screen for excessive or problematic alcohol use. To confirm a diagnosis of alcohol use disorder, at least two symptoms must be present within a 41-month period. The severity of alcohol use disorder depends on the number of symptoms:  Mild--two or three.  Moderate--four or five.  Severe--six or more. Your health care provider may perform a physical exam or use results from lab tests to see if you have physical problems resulting from alcohol use. Your health care provider may refer you to a mental health professional for evaluation. TREATMENT  Some people with alcohol use disorder are able to reduce their alcohol use to low-risk levels. Some people with alcohol use disorder need to quit drinking alcohol. When necessary, mental health professionals with specialized training in substance use treatment can help. Your health care provider can help you decide how severe your alcohol use disorder is and what type of treatment you need. The following forms of treatment are available:   Detoxification. Detoxification involves the use of prescription medicines to prevent alcohol withdrawal symptoms in the first week after quitting. This is important for people with a history of symptoms of withdrawal and for  heavy drinkers who are likely to have withdrawal symptoms. Alcohol withdrawal can be dangerous and, in severe cases, cause death. Detoxification is usually provided in a hospital or in-patient substance use treatment facility.  Counseling or talk therapy. Talk therapy is provided by substance use treatment counselors. It addresses the reasons people use alcohol and ways to keep them from drinking again. The goals of talk therapy are to help people with alcohol use disorder find healthy activities and ways to cope with life stress, to identify and avoid triggers for alcohol use, and to handle cravings, which can cause  relapse.  Medicines.Different medicines can help treat alcohol use disorder through the following actions:  Decrease alcohol cravings.  Decrease the positive reward response felt from alcohol use.  Produce an uncomfortable physical reaction when alcohol is used (aversion therapy).  Support groups. Support groups are run by people who have quit drinking. They provide emotional support, advice, and guidance. These forms of treatment are often combined. Some people with alcohol use disorder benefit from intensive combination treatment provided by specialized substance use treatment centers. Both inpatient and outpatient treatment programs are available. Document Released: 12/21/2004 Document Revised: 03/30/2014 Document Reviewed: 02/20/2013 [facility] Patient Information 2015 [place], [place]. This information is not intended to replace advice given to you by your health care provider. Make sure you discuss any questions you have with your health care provider.        Emergency Department Resource Guide 1) Find a Doctor and Pay Out of Pocket Although you won't have to find out who is covered by your insurance plan, it is a good idea to ask around and get recommendations. You will then need to call the office and see if the doctor you have chosen will accept you as a new patient and what types of options they offer for patients who are self-pay. Some doctors offer discounts or will set up payment plans for their patients who do not have insurance, but you will need to ask so you aren't surprised when you get to your appointment.  2) Contact Your Local Health Department Not all health departments have doctors that can see patients for sick visits, but many do, so it is worth a call to see if yours does. If you don't know where your local health department is, you can check in your phone book. The CDC also has a tool to help you locate your state's health department, and many state websites also have  listings of all of their local health departments.  3) Find a [facility] If your illness is not likely to be very severe or complicated, you may want to try a walk in clinic. These are popping up all over the country in pharmacies, drugstores, and shopping centers. They're usually staffed by nurse practitioners or physician assistants that have been trained to treat common illnesses and complaints. They're usually fairly quick and inexpensive. However, if you have serious medical issues or chronic medical problems, these are probably not your best option.  No Primary Care Doctor: - Call Health Connect at  [phone] - they can help you locate a primary care doctor that  accepts your insurance, provides certain services, etc. - Physician Referral Service- [phone]  Chronic Pain Problems: Organization         Address  Phone   Notes  [facility]  [phone] Patients need to be referred by their primary care doctor.   Medication Assistance: Organization  Address  Phone   Notes  [facility] Medication [facility] [address] [place], [place] [address] [phone] --Must be a resident of [facility] -- Must have NO insurance coverage whatsoever (no Medicaid/ Medicare, etc.) -- The pt. MUST have a primary care doctor that directs their care regularly and follows them in the community   MedAssist  [phone]   [employer]  [phone]    Agencies that provide inexpensive medical care: Organization         Address  Phone   Notes  [place]  [phone]   [facility]    [phone]   [facility] [address], [place] [address] [phone]   [place] [address] [place] [phone]   Planned Parenthood    [phone]   [facility]    [phone]   [place] and [place]  [address], [place] Phone:  [phone], Fax:  [phone] Hours of Operation:  9 am - 6 pm, M-F.  Also accepts Medicaid/Medicare and self-pay.  [facility] [place], [address], [place] Phone: [phone], Fax: [phone]. Hours of Operation:  8:30 am - 5:30 pm, M-F.  Also accepts Medicaid and self-pay.  [facility] [place] [address], [place] Phone: [phone]   [address], [place], [place] [phone], Ext. [phone] Mondays & Thursdays: 7-9 AM.  First 15 patients are seen on a first come, first serve basis.    [place] Providers:  Organization         Address  Phone   Notes  [facility] [address], [place] [phone] Also accepts self-pay patients.  [facility] [address], [place]  [phone]   [place], [address], [place] [phone]   [facility] [address] [place] [phone]   Lucianne Lei [address], [place]   [phone] Only accepts [place] Access [place] patients after they have their name applied to their card.   Self-Pay (no insurance) in [facility]:  Organization         Address  Phone   Notes  Sickle Cell Patients, [facility] [place] [phone]   [facility] [place] [phone]   [facility] [place]  [place], [place], [place] [phone]   [facility]/Dr. Osei-Bonsu  [address] [place] or [address], [place] [phone] Phone number for both [place] and [place] locations is the same.  Urgent Medical and [facility] [address], [place] [phone]   [facility] [address]  [place] or [address] [phone]   [facility] [address], [place] [phone], phone; [phone], fax Sees patients 1st and 3rd Saturday of every month.  Must not qualify for public or private insurance (i.e. Medicaid, Medicare, [place] Health Choice, Veterans' Benefits)  Household income should be no more than 200% of the poverty level The clinic cannot treat you if you are pregnant or think you  are pregnant  Sexually transmitted diseases are not treated at the clinic.   Dental Care: Organization         Address  Phone  Notes  [facility] Department of [facility] [place] [phone] Accepts children up to age 46 who are enrolled in [place] or [place]; pregnant women with a Medicaid card; and children who have applied for Medicaid or [place] Health Choice, but were declined, whose parents can pay a reduced fee at time of service.  [facility] Department of [facility]  [address], [place] [phone] Accepts children up to age 69 who are enrolled in [place] or [place]; pregnant women with a Medicaid card; and children who have applied for Medicaid or [place] Health Choice, but were declined, whose parents can pay a reduced fee at time of service.  [place] Adult Dental Access PROGRAM  [place] [phone] Patients are seen by appointment only. Walk-ins are not accepted. [place] will see patients 20 years of age and older. Monday - Tuesday (8am-5pm) Most Wednesdays (8:30-5pm) $30 per visit, cash only  [facility] Adult Dental Access PROGRAM  [address], [facility] [phone] Patients are seen by appointment only. Walk-ins are not accepted. [place] will see patients 67 years of age and older. One Wednesday Evening (Monthly: Volunteer Based).  $30 per visit, cash only  [place]  [phone] for adults; Children under age 62, call Graduate Pediatric Dentistry at [phone]. Children aged 37-14, please call [phone] to request a pediatric application.  Dental services are provided in all areas of dental care including fillings, crowns and bridges, complete and partial dentures, implants, gum treatment, root canals, and extractions. Preventive care is also provided. Treatment is provided to both adults and children. Patients are selected via a lottery and there is often a waiting list.   [facility] [address], [place]  [phone] [email or web address]   [facility] [address] [place], [place] [phone], Ext. [phone] Second and Fourth Thursday of each month, opens at 6:30 AM; Clinic ends at 9 AM.  Patients are seen on a first-come first-served basis, and a limited number are seen during each clinic.   [facility]  [address] Hillard Danker [place], [place] [phone]   Eligibility Requirements You must have lived in [place], [place], or [place] counties for at least the last three months.   You cannot be eligible for state or federal sponsored [employer], including [employer], [place], or [employer].   You generally cannot be eligible for healthcare insurance through your employer.    How to apply: Eligibility screenings are held every Tuesday and Wednesday afternoon from 1:00 pm until 4:00 pm. You do not need an appointment for the interview!  [facility] [address] [place], [place]   [place]  [place] Department  [place]  [phone]    Behavioral Health Resources in the Community: Intensive Outpatient Programs Organization         Address  Phone  Notes  [place]. [address], [place], [place] [phone]    [facility] Outpatient [address], [place], [place]   ADS: Alcohol & Drug Svcs [address],  [place], [place]   [place] [address],  [place], [place] or [phone]   Substance Abuse Resources Organization         Address  Phone  Notes  Alcohol and Drug Services  [phone]   [place]  [phone]   [facility] [place]   Chinita Pester  [phone]   Residential & Outpatient Substance Abuse Program  [phone]   Psychological Services Organization         Address  Phone  Notes  [facility] [place]  [place]  [phone]   [place] [address], [place] or [phone]    Mobile Crisis Teams Organization         Address  Phone  Notes  [facility], Mobile Crisis Care Unit  [phone]   [facility]  [address] [place], [place]   [place] Levels [address] [place] Lopez [place] [phone]    Self-Help/Support Groups Organization         Address  Phone             Notes  [place]. of [place] - variety of support groups  [place] Call for more information  Narcotics Anonymous (NA), [facility] [address], [employer] [place]  2 meetings at this location   Special educational needs teacher         Address  Phone  Notes  [facility] Residential Treatment [place],    [place]  [phone]   [facility]  [address], [place] [phone], [employer], [place]   [place], [place] [phone] Admissions: 8am-3pm M-F  Incentives Substance [place] [address],    [place], [place] [phone]   [facility] [address] [place], [place], [place]   [facility] [address],  [place],  [place]   [facility] - Intensive Outpatient [address], [place], [place]   [facility] ([place].) [address].,  [place], [place] [phone] or [phone]   [facility]idential [facility] Services (RTS) [address] [place], [place] Accepts Medicaid  [facility] [address],  [place] [phone] Substance Abuse/Addiction Treatment   [facility] Organization         Address  Phone  Notes  [facility]  [phone]   Domenic Schwab, PhD [address] Arlis Porta [place], [place]   [phone] or [phone]   [place], [place] [phone]   [facility] [address], [place], [place] [phone] Insurance/Medicaid/sponsorship through [facility] and Families [address] [place]                                    [place], [place] [phone] [place] [address] [place], [place] [phone]    Dr. Adele Schilder  [phone]   [facility] Dept. 1) [address] [place] 2) [place] 3)  [place] [address], [place] [phone]  [phone]   [place] [phone]  or [phone] (After Hours)

## 2014-06-28 NOTE — Progress Notes (Signed)
CSW met with pt to discuss substance abuse and homelessness resources. CSW provided information sheets and answered pt questions.  Rochele Pages,     ED CSW  phone: [phone] 9:02am

## 2014-06-28 NOTE — ED Notes (Signed)
Dr Rulon Abide into see OK to dc

## 2014-06-28 NOTE — ED Notes (Signed)
Late note-patient had left his dc instructions and information on treatment programs in his room on dc, but returned to get them.

## 2014-06-30 NOTE — Consult Note (Signed)
Agree with plan 

## 2014-07-28 HISTORY — PX: OTHER SURGICAL HISTORY: SHX169

## 2014-09-04 ENCOUNTER — Emergency Department (HOSPITAL_COMMUNITY)
Admission: EM | Admit: 2014-09-04 | Discharge: 2014-09-06 | Disposition: A | Discharge status: Other Acute Inpatient Hospital | Payer: Self-pay | Attending: Emergency Medicine | Admitting: Emergency Medicine

## 2014-09-04 ENCOUNTER — Emergency Department (HOSPITAL_COMMUNITY): Payer: Self-pay

## 2014-09-04 ENCOUNTER — Encounter (HOSPITAL_COMMUNITY): Payer: Self-pay | Admitting: Emergency Medicine

## 2014-09-04 DIAGNOSIS — F1010 Alcohol abuse, uncomplicated: Secondary | ICD-10-CM | POA: Insufficient documentation

## 2014-09-04 DIAGNOSIS — K219 Gastro-esophageal reflux disease without esophagitis: Secondary | ICD-10-CM | POA: Insufficient documentation

## 2014-09-04 DIAGNOSIS — F329 Major depressive disorder, single episode, unspecified: Secondary | ICD-10-CM | POA: Insufficient documentation

## 2014-09-04 DIAGNOSIS — Z87891 Personal history of nicotine dependence: Secondary | ICD-10-CM | POA: Insufficient documentation

## 2014-09-04 DIAGNOSIS — Z8619 Personal history of other infectious and parasitic diseases: Secondary | ICD-10-CM | POA: Insufficient documentation

## 2014-09-04 DIAGNOSIS — Z79899 Other long term (current) drug therapy: Secondary | ICD-10-CM | POA: Insufficient documentation

## 2014-09-04 DIAGNOSIS — Z8669 Personal history of other diseases of the nervous system and sense organs: Secondary | ICD-10-CM | POA: Insufficient documentation

## 2014-09-04 LAB — COMPREHENSIVE METABOLIC PANEL
ALT: 107 U/L — ABNORMAL HIGH (ref 0–53)
AST: 192 U/L — ABNORMAL HIGH (ref 0–37)
Albumin: 4.2 g/dL (ref 3.5–5.2)
Alkaline Phosphatase: 39 U/L (ref 39–117)
Anion gap: 18 — ABNORMAL HIGH (ref 5–15)
BUN: 9 mg/dL (ref 6–23)
CO2: 24 mEq/L (ref 19–32)
Calcium: 9.1 mg/dL (ref 8.4–10.5)
Chloride: 90 mEq/L — ABNORMAL LOW (ref 96–112)
Creatinine, Ser: 0.92 mg/dL (ref 0.50–1.35)
GFR calc Af Amer: >90 mL/min (ref >90)
GFR calc non Af Amer: >90 mL/min (ref >90)
Glucose, Bld: 227 mg/dL — ABNORMAL HIGH (ref 70–99)
Potassium: 4.5 mEq/L (ref 3.7–5.3)
Sodium: 132 mEq/L — ABNORMAL LOW (ref 137–147)
Total Bilirubin: 0.3 mg/dL (ref 0.3–1.2)
Total Protein: 9.3 g/dL — ABNORMAL HIGH (ref 6.0–8.3)

## 2014-09-04 LAB — I-STAT TROPONIN, ED: Troponin i, poc: 0 ng/mL (ref 0.00–0.08)

## 2014-09-04 LAB — CBC
HCT: 37.7 % — ABNORMAL LOW (ref 39.0–52.0)
Hemoglobin: 12.8 g/dL — ABNORMAL LOW (ref 13.0–17.0)
MCH: 27.4 pg (ref 26.0–34.0)
MCHC: 34 g/dL (ref 30.0–36.0)
MCV: 80.6 fL (ref 78.0–100.0)
Platelets: 174 10*3/uL (ref 150–400)
RBC: 4.68 MIL/uL (ref 4.22–5.81)
RDW: 15.5 % (ref 11.5–15.5)
WBC: 3.9 10*3/uL — ABNORMAL LOW (ref 4.0–10.5)

## 2014-09-04 LAB — RAPID URINE DRUG SCREEN, HOSP PERFORMED
Amphetamines: NOT DETECTED
Barbiturates: NOT DETECTED
Benzodiazepines: NOT DETECTED
Cocaine: NOT DETECTED
Opiates: NOT DETECTED
Tetrahydrocannabinol: NOT DETECTED

## 2014-09-04 LAB — LIPASE, BLOOD: Lipase: 196 U/L — ABNORMAL HIGH (ref 11–59)

## 2014-09-04 LAB — ETHANOL: Alcohol, Ethyl (B): 230 mg/dL — ABNORMAL HIGH (ref 0–11)

## 2014-09-04 LAB — ACETAMINOPHEN LEVEL: Acetaminophen (Tylenol), Serum: <15 ug/mL (ref 10–30)

## 2014-09-04 LAB — SALICYLATE LEVEL: Salicylate Lvl: <2 mg/dL — ABNORMAL LOW (ref 2.8–20.0)

## 2014-09-04 MED ORDER — ZOLPIDEM TARTRATE 5 MG PO TABS
5.0000 mg | ORAL_TABLET | Freq: Every evening | ORAL | Status: DC | PRN
  Administered 2014-09-05 (×2): 5 mg via ORAL
  Filled 2014-09-04 (×2): qty 1

## 2014-09-04 MED ORDER — FOLIC ACID 1 MG PO TABS
1.0000 mg | ORAL_TABLET | Freq: Every day | ORAL | Status: DC
  Administered 2014-09-05 – 2014-09-06 (×2): 1 mg via ORAL
  Filled 2014-09-04 (×2): qty 1

## 2014-09-04 MED ORDER — ONDANSETRON HCL 4 MG PO TABS: 4.0000 mg | ORAL_TABLET | Freq: Three times a day (TID) | ORAL | Status: DC | PRN

## 2014-09-04 MED ORDER — LORAZEPAM 2 MG/ML IJ SOLN
1.0000 mg | Freq: Once | INTRAMUSCULAR | Status: AC
  Administered 2014-09-04: 1 mg via INTRAVENOUS

## 2014-09-04 MED ORDER — THIAMINE HCL 100 MG/ML IJ SOLN
100.0000 mg | Freq: Every day | INTRAMUSCULAR | Status: DC
  Administered 2014-09-04: 100 mg via INTRAVENOUS
  Filled 2014-09-04: qty 2

## 2014-09-04 MED ORDER — ADULT MULTIVITAMIN W/MINERALS CH
1.0000 | ORAL_TABLET | Freq: Every day | ORAL | Status: DC
  Administered 2014-09-05 – 2014-09-06 (×2): 1 via ORAL
  Filled 2014-09-04 (×2): qty 1

## 2014-09-04 MED ORDER — SODIUM CHLORIDE 0.9 % IV BOLUS (SEPSIS)
500.0000 mL | Freq: Once | INTRAVENOUS | Status: AC
  Administered 2014-09-04: 500 mL via INTRAVENOUS

## 2014-09-04 MED ORDER — LORAZEPAM 1 MG PO TABS: 1.0000 mg | ORAL_TABLET | Freq: Four times a day (QID) | ORAL | Status: DC | PRN

## 2014-09-04 MED ORDER — VITAMIN B-1 100 MG PO TABS: 100.0000 mg | ORAL_TABLET | Freq: Every day | ORAL | Status: DC

## 2014-09-04 MED ORDER — ACETAMINOPHEN 325 MG PO TABS
650.0000 mg | ORAL_TABLET | ORAL | Status: DC | PRN
Start: 2014-09-04 — End: 2014-09-06
  Administered 2014-09-05: 650 mg via ORAL
  Filled 2014-09-04: qty 2

## 2014-09-04 MED ORDER — LORAZEPAM 1 MG PO TABS
0.0000 mg | ORAL_TABLET | Freq: Two times a day (BID) | ORAL | Status: DC
  Administered 2014-09-05: 1 mg via ORAL

## 2014-09-04 MED ORDER — LORAZEPAM 1 MG PO TABS
0.0000 mg | ORAL_TABLET | Freq: Four times a day (QID) | ORAL | Status: DC
  Administered 2014-09-05: 2 mg via ORAL
  Administered 2014-09-05 (×3): 1 mg via ORAL
  Filled 2014-09-04 (×6): qty 1

## 2014-09-04 MED ORDER — ALUM & MAG HYDROXIDE-SIMETH 200-200-20 MG/5ML PO SUSP: 30.0000 mL | ORAL | Status: DC | PRN

## 2014-09-04 MED ORDER — LORAZEPAM 2 MG/ML IJ SOLN
1.0000 mg | Freq: Four times a day (QID) | INTRAMUSCULAR | Status: DC | PRN
  Administered 2014-09-04: 1 mg via INTRAVENOUS
  Filled 2014-09-04 (×2): qty 1

## 2014-09-04 MED ORDER — SODIUM CHLORIDE 0.9 % IV SOLN
INTRAVENOUS | Status: AC
  Administered 2014-09-04: 17:00:00 via INTRAVENOUS

## 2014-09-04 MED ORDER — NICOTINE 21 MG/24HR TD PT24
21.0000 mg | MEDICATED_PATCH | Freq: Every day | TRANSDERMAL | Status: DC
  Filled 2014-09-04 (×2): qty 1

## 2014-09-04 MED ORDER — IOHEXOL 350 MG/ML SOLN
100.0000 mL | Freq: Once | INTRAVENOUS | Status: AC | PRN
  Administered 2014-09-04: 100 mL via INTRAVENOUS

## 2014-09-04 NOTE — ED Provider Notes (Signed)
CSN: [id number]     Arrival date & time 09/04/14  1410 History   First MD Initiated Contact with Patient 09/04/14 1510     Chief Complaint  Patient presents with  . Delirium Tremens (DTS)  . Anxiety      HPI Pt arrives stating he's an alcoholic with hx of pancreatitis. States he wants help with alcohol detox so he can go back to [facility]. Pt states he feels faint. States he's feeling anxious. Pt awake, lethargic appearance. Pt disoriented to time.   Past Medical History  Diagnosis Date  . Alcohol abuse   . Sarcoidosis of lung   . Reflux   . Depression   . Anxiety   . Pancreatitis   . Carpal tunnel syndrome of right wrist   . GERD (gastroesophageal reflux disease)    Past Surgical History  Procedure Laterality Date  . Knee arthroscopy    . Carpel tunnel release    . Elbow surgery     Family History  Problem Relation Age of Onset  . Diabetes Father   . Hypertension Father   . Hypertension Paternal Grandfather   . Alcohol abuse Paternal Grandfather   . Alcohol abuse Maternal Grandfather   . Alcohol abuse Maternal Grandmother   . Alcohol abuse Paternal Grandmother    History  Substance Use Topics  . Smoking status: Former Smoker    Types: Cigars  . Smokeless tobacco: Not on file  . Alcohol Use: 0.0 oz/week     Comment: Patient drinks at minimum two 40oz daily     Review of Systems  All other systems reviewed and are negative.     Allergies  Oxycodone  Home Medications   Prior to Admission medications   Medication Sig Start Date End Date Taking? Authorizing Provider  doxepin (SINEQUAN) 25 MG capsule Take 1 capsule (25 mg total) by mouth at bedtime as needed (Insomnia). 06/03/14   Encarnacion Slates, NP  escitalopram (LEXAPRO) 10 MG tablet Take 1 tablet (10 mg total) by mouth daily. For depression 06/03/14   Encarnacion Slates, NP  gabapentin (NEURONTIN) 100 MG capsule Take 2 capsules (200 mg total) by mouth 2 (two) times daily. For Substance withdrawal syndrome  06/03/14   Encarnacion Slates, NP  hydrOXYzine (ATARAX/VISTARIL) 25 MG tablet Take 1 tablet (25 mg) three times daily as needed for anxiety 06/03/14   Encarnacion Slates, NP  Multiple Vitamin (MULTIVITAMIN WITH MINERALS) TABS tablet Take 1 tablet by mouth daily. For low vitamin 06/03/14   Encarnacion Slates, NP  pantoprazole (PROTONIX) 40 MG tablet Take 1 tablet (40 mg total) by mouth daily. For acid reflux 06/03/14   Encarnacion Slates, NP  triamcinolone (KENALOG) 0.025 % cream Apply topically 2 (two) times daily. For rashes 06/03/14   Encarnacion Slates, NP  vitamin B-12 (CYANOCOBALAMIN) 100 MCG tablet Take 1 tablet (100 mcg total) by mouth daily. For low B-12 vitamin 06/03/14   Encarnacion Slates, NP   BP 150/93  Pulse 100  Temp(Src) 98.9 F (37.2 C) (Oral)  Resp 24  SpO2 100% Physical Exam  Nursing note and vitals reviewed. Constitutional: He is oriented to person, place, and time. He appears well-developed and well-nourished. No distress.  HENT:  Head: Normocephalic and atraumatic.  Eyes: Pupils are equal, round, and reactive to light.  Neck: Normal range of motion.  Cardiovascular: Normal rate and intact distal pulses.   Pulmonary/Chest: No respiratory distress. He has no wheezes.  Abdominal: Normal appearance. He  exhibits no distension. There is no tenderness. There is no rebound and no guarding.  Musculoskeletal: Normal range of motion.  Neurological: He is alert and oriented to person, place, and time. No cranial nerve deficit.  Skin: Skin is warm and dry. No rash noted.  Psychiatric: He has a normal mood and affect. His behavior is normal.    ED Course  Procedures (including critical care time) Labs Review Labs Reviewed  CBC - Abnormal; Notable for the following:    WBC 3.9 (*)    Hemoglobin 12.8 (*)    HCT 37.7 (*)    All other components within normal limits  COMPREHENSIVE METABOLIC PANEL - Abnormal; Notable for the following:    Sodium 132 (*)    Chloride 90 (*)    Glucose, Bld 227 (*)    Total Protein  9.3 (*)    AST 192 (*)    ALT 107 (*)    Anion gap 18 (*)    All other components within normal limits  ETHANOL - Abnormal; Notable for the following:    Alcohol, Ethyl (B) 230 (*)    All other components within normal limits  SALICYLATE LEVEL - Abnormal; Notable for the following:    Salicylate Lvl <3.2 (*)    All other components within normal limits  LIPASE, BLOOD - Abnormal; Notable for the following:    Lipase 196 (*)    All other components within normal limits  ACETAMINOPHEN LEVEL  URINE RAPID DRUG SCREEN (HOSP PERFORMED)  I-STAT TROPOININ, ED    Imaging Review Ct Angio Chest Pe W/cm &/or Wo Cm  09/04/2014   CLINICAL DATA:  Chest pain for 1 month  EXAM: CT ANGIOGRAPHY CHEST WITH CONTRAST  TECHNIQUE: Multidetector CT imaging of the chest was performed using the standard protocol during bolus administration of intravenous contrast. Multiplanar CT image reconstructions and MIPs were obtained to evaluate the vascular anatomy.  CONTRAST:  115mL OMNIPAQUE IOHEXOL 350 MG/ML SOLN  COMPARISON:  07/08/2014  FINDINGS: Sagittal images of the spine shows mild degenerative changes thoracic spine. Images of the thoracic inlet are unremarkable. Central airways are patent.  The study is of excellent technical quality. No pulmonary embolus is noted.  Again noted extensive mediastinal and hilar adenopathy without change from prior exam. Calcified bilateral hilar lymph nodes are again noted.  Again noted diffuse interstitial prominence and fibrotic changes in upper lobe. Bilateral upper lobes bronchiectasis again noted. Again findings are highly suspicious for sarcoidosis.  No segmental infiltrate or pulmonary edema. The visualized upper abdomen is unremarkable. Heart size within normal limits.  Review of the MIP images confirms the above findings.  IMPRESSION: 1. No pulmonary embolus is noted. 2. Again noted bilateral hilar and mediastinal adenopathy. Stable calcified hilar lymph nodes bilaterally. 3. Again  noted extensive fibrotic changes bronchiectasis and interstitial prominence bilateral upper lobes. Findings are highly suspicious for sarcoidosis. Clinical correlation is necessary. No superimposed segmental infiltrate or pulmonary edema.   Electronically Signed   By: Lahoma Crocker M.D.   On: 09/04/2014 20:18   Dg Chest Portable 1 View  09/04/2014   CLINICAL DATA:  History of sarcoidosis, currently presenting with chest pain and difficulty breathing  EXAM: PORTABLE CHEST - 1 VIEW  COMPARISON:  Chest radiograph January 18, 2014 and chest CT July 08, 2014  FINDINGS: There is extensive interstitial thickening with a distinct upper lobe prominence, a finding typically seen with sarcoidosis. Milder scarring is seen elsewhere in the lungs. There is no airspace consolidation. The heart  size is within normal limits. Pulmonary vascular is within normal limits. There is mediastinal and hilar adenopathy, stable and somewhat better delineated on CT. There is no new opacity or new adenopathy. No bone lesions.  IMPRESSION: Changes typical of sarcoidosis with adenopathy and upper lobe predominant interstitial disease. No frank consolidation. No new opacity.   Electronically Signed   By: Lowella Grip M.D.   On: 09/04/2014 15:56    Patient appears stable to move to psych  MDM   Final diagnoses:  None        Dot Lanes, MD 09/05/14 2204

## 2014-09-04 NOTE — ED Notes (Signed)
TTS in progress 

## 2014-09-04 NOTE — ED Notes (Signed)
Pt placed in blue scrubs, wanded by security and belongings in pt bags

## 2014-09-04 NOTE — BH Assessment (Addendum)
Tele Assessment Note   [name] [name] is an 53 y.o. male.  -Pt seen by Dr. Leonard Schwartz at [facility].  Patient is looking for detox services to   Patient went to [facility] (number 2 house) to apply for residential tx program.  They told him that he had to go through detox first.  They do have a space for him there once he completes tx.  Patient has been drinking a 12 pack per day for the last 2 or more months.  He last drank around 09:00 on Thursday 10/08.  Patient currently describes feeling shaky, upset stomach, feeling nervous & anxious.  Patient also reports some depression.  Patient denies any current / recurrent SI, HI or A/V hallucinations.  Patient was at [facility] in July, June & April of this year for detox services.  Patient has recently faced eviction from the place where he was residing.  He currently has no transportation.    -Patient will need inpatient detox services.  At this time there are no beds are available at [facility] on [address] nor at [facility] or [facility].  Waylan Boga, NP said that patient may be able to go to OBS unit if it is open in the AM on 10/10.    Axis I: 303.90 ETOH use d/o severe Axis II: Deferred Axis III:  Past Medical History  Diagnosis Date  . Alcohol abuse   . Sarcoidosis of lung   . Reflux   . Depression   . Anxiety   . Pancreatitis   . Carpal tunnel syndrome of right wrist   . GERD (gastroesophageal reflux disease)    Axis IV: economic problems, housing problems, occupational problems, other psychosocial or environmental problems and problems with access to health care services Axis V: 31-40 impairment in reality testing  Past Medical History:  Past Medical History  Diagnosis Date  . Alcohol abuse   . Sarcoidosis of lung   . Reflux   . Depression   . Anxiety   . Pancreatitis   . Carpal tunnel syndrome of right wrist   . GERD (gastroesophageal reflux disease)     Past Surgical History  Procedure Laterality Date  . Knee arthroscopy    . Carpel tunnel  release    . Elbow surgery      Family History:  Family History  Problem Relation Age of Onset  . Diabetes Father   . Hypertension Father   . Hypertension Paternal Grandfather   . Alcohol abuse Paternal Grandfather   . Alcohol abuse Maternal Grandfather   . Alcohol abuse Maternal Grandmother   . Alcohol abuse Paternal Grandmother     Social History:  reports that he has quit smoking. His smoking use included Cigars. He does not have any smokeless tobacco history on file. He reports that he drinks alcohol. He reports that he does not use illicit drugs.  Additional Social History:  Alcohol / Drug Use Pain Medications: None Prescriptions: Medications are at [place] Over the Counter: N/A History of alcohol / drug use?: Yes Longest period of sobriety (when/how long): Amost 5 months in 2014 Negative Consequences of Use: Personal relationships Withdrawal Symptoms: Fever / Chills;Tremors;Weakness;Patient aware of relationship between substance abuse and physical/medical complications;Nausea / Vomiting;Diarrhea;Tachycardia Substance #1 Name of Substance 1: ETOH (mostly beer) 1 - Age of First Use: 53 years of age 89 - Amount (size/oz): 12 pack per day 1 - Frequency: Daily use 1 - Duration: Last 2-3 months 1 - Last Use / Amount: 10/08  at 09:00  CIWA: CIWA-Ar BP: 138/89 mmHg Pulse Rate: 97 Nausea and Vomiting: no nausea and no vomiting Tactile Disturbances: none Tremor: not visible, but can be felt fingertip to fingertip Auditory Disturbances: not present Paroxysmal Sweats: no sweat visible Visual Disturbances: not present Anxiety: three Headache, Fullness in Head: none present Agitation: moderately fidgety and restless Orientation and Clouding of Sensorium: oriented and can do serial additions CIWA-Ar Total: 8 COWS:    PATIENT STRENGTHS: (choose at least two) Capable of independent living Communication skills Motivation for treatment/growth  Allergies:  Allergies   Allergen Reactions  . Oxycodone Other (See Comments)    abd pain, stomach cramps     Home Medications:  (Not in a hospital admission)  OB/GYN Status:  No LMP for male patient.  General Assessment Data Location of Assessment: [facility] ED Is this a Tele or Face-to-Face Assessment?: Tele Assessment Is this an Initial Assessment or a Re-assessment for this encounter?: Initial Assessment Living Arrangements: Alone Can pt return to current living arrangement?: Yes Admission Status: Voluntary Is patient capable of signing voluntary admission?: Yes Transfer from: [facility] Referral Source: Self/Family/Friend     [place] Living Arrangements: Alone Name of Psychiatrist: Not at this time Name of Therapist: No     Risk to self with the past 6 months Suicidal Ideation: No Suicidal Intent: No Is patient at risk for suicide?: No Suicidal Plan?: No Access to Means: No What has been your use of drugs/alcohol within the last 12 months?: Daily use of ETOH Previous Attempts/Gestures: No How many times?: 0 Other Self Harm Risks: SA issues Triggers for Past Attempts: None known Intentional Self Injurious Behavior: None Family Suicide History: No Recent stressful life event(s): Financial Problems;Other (Comment) (Was about to be evicted from apartment recently.) Persecutory voices/beliefs?: No Depression: Yes Depression Symptoms: Despondent;Loss of interest in usual pleasures;Feeling worthless/self pity Substance abuse history and/or treatment for substance abuse?: Yes Suicide prevention information given to non-admitted patients: Not applicable  Risk to Others within the past 6 months Homicidal Ideation: No Thoughts of Harm to Others: No Current Homicidal Intent: No Current Homicidal Plan: No Access to Homicidal Means: No Identified Victim: No one History of harm to others?: No Assessment of Violence: None Noted Violent Behavior Description: None noted. Does  patient have access to weapons?: No Criminal Charges Pending?: No Does patient have a court date: No  Psychosis Hallucinations: None noted Delusions: None noted  Mental Status Report Appear/Hygiene: Disheveled;In scrubs Eye Contact: Good Motor Activity: Freedom of movement;Unremarkable Speech: Logical/coherent Level of Consciousness: Quiet/awake Mood: Depressed;Sad Affect: Sad Anxiety Level: Severe Thought Processes: Coherent;Relevant Judgement: Unimpaired Orientation: Person;Place;Time;Situation Obsessive Compulsive Thoughts/Behaviors: None  Cognitive Functioning Concentration: Decreased Memory: Recent Impaired;Remote Intact IQ: Average Insight: Good Impulse Control: Poor Appetite: Poor Weight Loss:  (20 lbs in last 4 months.) Weight Gain: 0 Sleep: Decreased Total Hours of Sleep:  (<6H/D) Vegetative Symptoms: Staying in bed  ADLScreening [facility] Assessment Services) Patient's cognitive ability adequate to safely complete daily activities?: Yes Patient able to express need for assistance with ADLs?: Yes Independently performs ADLs?: Yes (appropriate for developmental age)  Prior Inpatient Therapy Prior Inpatient Therapy: Yes Prior Therapy Dates: August 2015 Prior Therapy Facilty/Provider(s): [facility] ([facility]) Reason for Treatment: Detox  Prior Outpatient Therapy Prior Outpatient Therapy: Yes Prior Therapy Dates: Last year Prior Therapy Facilty/Provider(s): [facility] Reason for Treatment: SA  ADL Screening (condition at time of admission) Patient's cognitive ability adequate to safely complete daily activities?: Yes Is  the patient deaf or have difficulty hearing?: No Does the patient have difficulty seeing, even when wearing glasses/contacts?: No (uses reading glasses) Does the patient have difficulty concentrating, remembering, or making decisions?: No Patient able to express need for assistance with ADLs?: Yes Does the patient have  difficulty dressing or bathing?: No Independently performs ADLs?: Yes (appropriate for developmental age) Does the patient have difficulty walking or climbing stairs?: No Weakness of Legs: None Weakness of Arms/Hands: None       Abuse/Neglect Assessment (Assessment to be complete while patient is alone) Physical Abuse: Denies Verbal Abuse: Denies Sexual Abuse: Denies Exploitation of patient/patient's resources: Denies Self-Neglect: Denies Values / Beliefs Cultural Requests During Hospitalization: None Spiritual Requests During Hospitalization: None   Advance Directives (For Healthcare) Does patient have an advance directive?: No Would patient like information on creating an advanced directive?: No - patient declined information    Additional Information 1:1 In Past 12 Months?: No CIRT Risk: No Elopement Risk: No Does patient have medical clearance?: Yes     Disposition:  Disposition Initial Assessment Completed for this Encounter: Yes Disposition of Patient: Inpatient treatment program;Referred to Type of inpatient treatment program: Adult Patient referred to: [facility]  Curlene Dolphin Ray 09/04/2014 11:55 PM

## 2014-09-04 NOTE — ED Notes (Signed)
Evening meal finished. No changes. Updated on wait, plan and process.

## 2014-09-04 NOTE — ED Notes (Signed)
TTS continues, pt participatory.

## 2014-09-04 NOTE — ED Notes (Signed)
Pt eating, alert, NAD, calm, interactive.

## 2014-09-04 NOTE — ED Notes (Signed)
Pt arrives stating he's an alcoholic with hx of pancreatitis. States he wants help with alcohol detox so he can go back to [employer] [facility]. Pt states he feels faint. States he's feeling anxious. Pt awake, lethargic appearance. Pt disoriented to time.

## 2014-09-04 NOTE — ED Notes (Signed)
Back from xray

## 2014-09-04 NOTE — ED Notes (Signed)
TTS set up at [facility], attempted call out to [facility]. Pending TTS interview.

## 2014-09-04 NOTE — ED Notes (Signed)
Pt to xray

## 2014-09-04 NOTE — ED Notes (Signed)
Diet ordered. Pt states, "feel better", admits to mild "a little" stomach ache, head cloudy, nausea ("not sure if r/t hunger"), denies sob, HA or dizziness. Alert, NAD, calm,interactive, jittery. VSS. IVF infusing.

## 2014-09-05 NOTE — Progress Notes (Signed)
[facility] unable to start placement at this time.  [facility]HT unable to access any H&P that pt is medically cleared for treatment.   Wyvonnia Dusky, MHT/NS

## 2014-09-05 NOTE — ED Notes (Signed)
Report given to Nunzio Cobbs, Therapist, sports. No changes. Pt given yellow socks. Up to b/r with steady gait. Still jittery. Belongings bagged and labeled with pt.

## 2014-09-05 NOTE — [facility] Counselor (Signed)
[facility] (spoke with Kennyth Lose) and [facility] (spoke with Ron). Both at bed capacity.   Lorenza Cambridge, [facility] Triage Specialist

## 2014-09-05 NOTE — Progress Notes (Signed)
MHT contacted the following facilities for detox placement:  1)[facility]-no available transportation back home not referred 2)[facility]-faxed referral 3)[facility]-previously declined 4)[facility]-faxed referral 5)[facility]-does not accept detox pt only needs dual diagnosis 6)[facility]-faxed referral  Wyvonnia Dusky, MHT/NS

## 2014-09-05 NOTE — BH Assessment (Signed)
[place] Assessment Progress Note   [name] [name] at [facility] declined patient due to medical acuity.  [facility] may be available in AM today.  Patient will need to be reviewed by provider for that unit.

## 2014-09-05 NOTE — ED Notes (Signed)
[name] [name] at [facility] psych triage paperwork requested in the attempt to get pt into [facility] [place] for detox.

## 2014-09-05 NOTE — BH Assessment (Signed)
[name] [name] confirms adult unit is at capacity. Contacted the following facilities for placement:   BED AVAILABLE, FAXED CLINICAL INFORMATION: [facility], per [place]   AT CAPACITY:  [facility], per [place], per [facility], per [facility], per Lorita Officer, [facility], [facility] Triage Specialist [phone]

## 2014-09-06 ENCOUNTER — Encounter (HOSPITAL_COMMUNITY): Payer: Self-pay | Admitting: *Deleted

## 2014-09-06 ENCOUNTER — Observation Stay (HOSPITAL_COMMUNITY)
Admission: AD | Admit: 2014-09-06 | Discharge: 2014-09-07 | Disposition: A | Discharge status: Home / Self Care | Payer: Self-pay | Source: Intra-hospital | Attending: Psychiatry | Admitting: Psychiatry

## 2014-09-06 DIAGNOSIS — F329 Major depressive disorder, single episode, unspecified: Secondary | ICD-10-CM | POA: Insufficient documentation

## 2014-09-06 DIAGNOSIS — F1020 Alcohol dependence, uncomplicated: Secondary | ICD-10-CM | POA: Insufficient documentation

## 2014-09-06 DIAGNOSIS — Z87891 Personal history of nicotine dependence: Secondary | ICD-10-CM | POA: Insufficient documentation

## 2014-09-06 DIAGNOSIS — F419 Anxiety disorder, unspecified: Secondary | ICD-10-CM | POA: Insufficient documentation

## 2014-09-06 DIAGNOSIS — D869 Sarcoidosis, unspecified: Secondary | ICD-10-CM | POA: Insufficient documentation

## 2014-09-06 DIAGNOSIS — K219 Gastro-esophageal reflux disease without esophagitis: Secondary | ICD-10-CM | POA: Insufficient documentation

## 2014-09-06 DIAGNOSIS — K860 Alcohol-induced chronic pancreatitis: Secondary | ICD-10-CM | POA: Insufficient documentation

## 2014-09-06 DIAGNOSIS — F10129 Alcohol abuse with intoxication, unspecified: Secondary | ICD-10-CM | POA: Diagnosis present

## 2014-09-06 DIAGNOSIS — R079 Chest pain, unspecified: Principal | ICD-10-CM | POA: Insufficient documentation

## 2014-09-06 MED ORDER — LORAZEPAM 1 MG PO TABS: 0.0000 mg | ORAL_TABLET | Freq: Two times a day (BID) | ORAL | Status: DC

## 2014-09-06 MED ORDER — LORAZEPAM 1 MG PO TABS
0.0000 mg | ORAL_TABLET | Freq: Four times a day (QID) | ORAL | Status: DC
  Administered 2014-09-06 (×2): 2 mg via ORAL
  Administered 2014-09-07 (×2): 1 mg via ORAL
  Filled 2014-09-06: qty 2
  Filled 2014-09-06: qty 1
  Filled 2014-09-06: qty 2
  Filled 2014-09-06: qty 1

## 2014-09-06 MED ORDER — DIPHENHYDRAMINE HCL 25 MG PO CAPS
50.0000 mg | ORAL_CAPSULE | Freq: Once | ORAL | Status: AC
  Administered 2014-09-06: 50 mg via ORAL
  Filled 2014-09-06: qty 2

## 2014-09-06 MED ORDER — LORAZEPAM 1 MG PO TABS: 1.0000 mg | ORAL_TABLET | Freq: Four times a day (QID) | ORAL | Status: DC | PRN

## 2014-09-06 MED ORDER — ADULT MULTIVITAMIN W/MINERALS CH
1.0000 | ORAL_TABLET | Freq: Every day | ORAL | Status: DC
  Administered 2014-09-07: 1 via ORAL
  Filled 2014-09-06 (×4): qty 1

## 2014-09-06 MED ORDER — THIAMINE HCL 100 MG/ML IJ SOLN: 100.0000 mg | Freq: Every day | INTRAMUSCULAR | Status: DC

## 2014-09-06 MED ORDER — LORAZEPAM 2 MG/ML IJ SOLN: 1.0000 mg | Freq: Four times a day (QID) | INTRAMUSCULAR | Status: DC | PRN

## 2014-09-06 MED ORDER — DOXEPIN HCL 25 MG PO CAPS: 25.0000 mg | ORAL_CAPSULE | Freq: Every evening | ORAL | Status: DC | PRN

## 2014-09-06 MED ORDER — LORAZEPAM 1 MG PO TABS
0.0000 mg | ORAL_TABLET | ORAL | Status: DC | PRN
  Administered 2014-09-06: 2 mg via ORAL

## 2014-09-06 MED ORDER — VITAMIN B-1 100 MG PO TABS
100.0000 mg | ORAL_TABLET | Freq: Every day | ORAL | Status: DC
  Administered 2014-09-06 – 2014-09-07 (×2): 100 mg via ORAL
  Filled 2014-09-06 (×5): qty 1

## 2014-09-06 MED ORDER — HYDROXYZINE HCL 25 MG PO TABS
25.0000 mg | ORAL_TABLET | Freq: Four times a day (QID) | ORAL | Status: DC | PRN
  Administered 2014-09-06 – 2014-09-07 (×2): 25 mg via ORAL
  Filled 2014-09-06 (×2): qty 1

## 2014-09-06 MED ORDER — LORAZEPAM 1 MG PO TABS
0.0000 mg | ORAL_TABLET | Freq: Four times a day (QID) | ORAL | Status: DC
  Filled 2014-09-06: qty 2

## 2014-09-06 MED ORDER — ADULT MULTIVITAMIN W/MINERALS CH
1.0000 | ORAL_TABLET | Freq: Every day | ORAL | Status: DC
  Filled 2014-09-06 (×3): qty 1

## 2014-09-06 MED ORDER — ESCITALOPRAM OXALATE 10 MG PO TABS
10.0000 mg | ORAL_TABLET | Freq: Every day | ORAL | Status: DC
  Administered 2014-09-06 – 2014-09-07 (×2): 10 mg via ORAL
  Filled 2014-09-06 (×5): qty 1

## 2014-09-06 MED ORDER — FOLIC ACID 1 MG PO TABS
1.0000 mg | ORAL_TABLET | Freq: Every day | ORAL | Status: DC
  Administered 2014-09-07: 1 mg via ORAL
  Filled 2014-09-06 (×4): qty 1

## 2014-09-06 MED ORDER — PANTOPRAZOLE SODIUM 40 MG PO TBEC
40.0000 mg | DELAYED_RELEASE_TABLET | Freq: Every day | ORAL | Status: DC
  Administered 2014-09-06 – 2014-09-07 (×2): 40 mg via ORAL
  Filled 2014-09-06 (×4): qty 1

## 2014-09-06 MED ORDER — GABAPENTIN 100 MG PO CAPS
200.0000 mg | ORAL_CAPSULE | Freq: Two times a day (BID) | ORAL | Status: DC
  Administered 2014-09-06 – 2014-09-07 (×2): 200 mg via ORAL
  Filled 2014-09-06 (×6): qty 2

## 2014-09-06 NOTE — Progress Notes (Signed)
[facility] [place] INPATIENT:  Family/Significant Other Suicide Prevention Education  Suicide Prevention Education:  Patient Refusal for Family/Significant Other Suicide Prevention Education: The patient [name] [name] has refused to provide written consent for family/significant other to be provided Family/Significant Other Suicide Prevention Education during admission and/or prior to discharge.    [name] 09/06/2014, 12:38 PM

## 2014-09-06 NOTE — ED Notes (Signed)
Fax received for Admission and Consent for treatment.

## 2014-09-06 NOTE — Progress Notes (Signed)
Pt states "sometimes I have blood in stool and blood in sperm when I ejaculate"; also c/o mid abdominal and lower chest pain, difficulty sleeping. Nena Polio NP made aware and present to assess pt, see MAR. Will monitor closely and evaluate for stabilization.

## 2014-09-06 NOTE — Consult Note (Signed)
Reason for Obs: Detox from ETOH Referring Physician: Jshaun Abernathy is an 53 y.o. male.  HPI: Patient presented to the [facility] stating he's an alcoholic with hx of pancreatitis. States he wants help with alcohol detox so he can go back to [employer].  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 He stated he was drinking a 12 pack a day for the past 24 hours.   He denies any history of WD seizures and reports currently that he is having nausea, tremors, vomiting, diarrhea, sweats, blurry vision and irritability.  He also denies SI/HI or AVH or any other recreational drug abuse.   Past Medical History  Diagnosis Date  . Alcohol abuse   . Sarcoidosis of lung   . Reflux   . Depression   . Anxiety   . Pancreatitis   . Carpal tunnel syndrome of right wrist   . GERD (gastroesophageal reflux disease)     Past Surgical History  Procedure Laterality Date  . Knee arthroscopy    . Carpel tunnel release    . Elbow surgery      Family History  Problem Relation Age of Onset  . Diabetes Father   . Hypertension Father   . Hypertension Paternal Grandfather   . Alcohol abuse Paternal Grandfather   . Alcohol abuse Maternal Grandfather   . Alcohol abuse Maternal Grandmother   . Alcohol abuse Paternal Grandmother     Social History:  reports that he has quit smoking. His smoking use included Cigars. He does not have any smokeless tobacco history on file. He reports that he drinks alcohol. He reports that he does not use illicit drugs.  Allergies:  Allergies  Allergen Reactions  . Oxycodone Other (See Comments)    abd pain, stomach cramps     Medications: I have reviewed the patient's current medications.  Results for orders placed during the hospital encounter of 09/04/14 (from the past 48 hour(s))  LIPASE, BLOOD     Status: Abnormal   Collection Time    09/04/14  3:17 PM      Result Value Ref Range   Lipase 196 (*) 11 - 59 U/L  URINE RAPID DRUG SCREEN (HOSP  PERFORMED)     Status: None   Collection Time    09/04/14  3:48 PM      Result Value Ref Range   Opiates NONE DETECTED  NONE DETECTED   Cocaine NONE DETECTED  NONE DETECTED   Benzodiazepines NONE DETECTED  NONE DETECTED   Amphetamines NONE DETECTED  NONE DETECTED   Tetrahydrocannabinol NONE DETECTED  NONE DETECTED   Barbiturates NONE DETECTED  NONE DETECTED   Comment:            DRUG SCREEN FOR MEDICAL PURPOSES     ONLY.  IF CONFIRMATION IS NEEDED     FOR ANY PURPOSE, NOTIFY LAB     WITHIN 5 DAYS.                LOWEST DETECTABLE LIMITS     FOR URINE DRUG SCREEN     Drug Class       Cutoff (ng/mL)     Amphetamine      1000     Barbiturate      200     Benzodiazepine   616     Tricyclics       073     Opiates          300  Cocaine          300     THC              17  I-STAT TROPOININ, ED     Status: None   Collection Time    09/04/14  3:48 PM      Result Value Ref Range   Troponin i, poc 0.00  0.00 - 0.08 ng/mL   Comment 3            Comment: Due to the release kinetics of cTnI,     a negative result within the first hours     of the onset of symptoms does not rule out     myocardial infarction with certainty.     If myocardial infarction is still suspected,     repeat the test at appropriate intervals.    Ct Angio Chest Pe W/cm &/or Wo Cm  09/04/2014   CLINICAL DATA:  Chest pain for 1 month  EXAM: CT ANGIOGRAPHY CHEST WITH CONTRAST  TECHNIQUE: Multidetector CT imaging of the chest was performed using the standard protocol during bolus administration of intravenous contrast. Multiplanar CT image reconstructions and MIPs were obtained to evaluate the vascular anatomy.  CONTRAST:  167mL OMNIPAQUE IOHEXOL 350 MG/ML SOLN  COMPARISON:  07/08/2014  FINDINGS: Sagittal images of the spine shows mild degenerative changes thoracic spine. Images of the thoracic inlet are unremarkable. Central airways are patent.  The study is of excellent technical quality. No pulmonary embolus is  noted.  Again noted extensive mediastinal and hilar adenopathy without change from prior exam. Calcified bilateral hilar lymph nodes are again noted.  Again noted diffuse interstitial prominence and fibrotic changes in upper lobe. Bilateral upper lobes bronchiectasis again noted. Again findings are highly suspicious for sarcoidosis.  No segmental infiltrate or pulmonary edema. The visualized upper abdomen is unremarkable. Heart size within normal limits.  Review of the MIP images confirms the above findings.  IMPRESSION: 1. No pulmonary embolus is noted. 2. Again noted bilateral hilar and mediastinal adenopathy. Stable calcified hilar lymph nodes bilaterally. 3. Again noted extensive fibrotic changes bronchiectasis and interstitial prominence bilateral upper lobes. Findings are highly suspicious for sarcoidosis. Clinical correlation is necessary. No superimposed segmental infiltrate or pulmonary edema.   Electronically Signed   By: Lahoma Crocker M.D.   On: 09/04/2014 20:18   Dg Chest Portable 1 View  09/04/2014   CLINICAL DATA:  History of sarcoidosis, currently presenting with chest pain and difficulty breathing  EXAM: PORTABLE CHEST - 1 VIEW  COMPARISON:  Chest radiograph January 18, 2014 and chest CT July 08, 2014  FINDINGS: There is extensive interstitial thickening with a distinct upper lobe prominence, a finding typically seen with sarcoidosis. Milder scarring is seen elsewhere in the lungs. There is no airspace consolidation. The heart size is within normal limits. Pulmonary vascular is within normal limits. There is mediastinal and hilar adenopathy, stable and somewhat better delineated on CT. There is no new opacity or new adenopathy. No bone lesions.  IMPRESSION: Changes typical of sarcoidosis with adenopathy and upper lobe predominant interstitial disease. No frank consolidation. No new opacity.   Electronically Signed   By: Lowella Grip M.D.   On: 09/04/2014 15:56    Review of Systems   Constitutional: Positive for diaphoresis.  HENT: Negative.   Eyes: Positive for blurred vision.  Respiratory: Negative.   Cardiovascular: Negative.   Gastrointestinal: Positive for nausea, diarrhea, blood in stool and melena (TImes 1 week. Patient also notes  there is blood in his semen x 1 week.).  Genitourinary: Negative.   Musculoskeletal: Negative.   Neurological: Positive for tremors. Negative for dizziness and tingling.  Endo/Heme/Allergies: Negative.   Psychiatric/Behavioral: Positive for substance abuse. Negative for depression, suicidal ideas, hallucinations and memory loss. The patient is nervous/anxious and has insomnia.    Blood pressure 145/112, pulse 92, temperature 98.3 F (36.8 C), temperature source Oral, resp. rate 18, height 5\' 9"  (1.753 m), weight 61.236 kg (135 lb), SpO2 100.00%. Physical Exam  Constitutional: He appears well-developed and well-nourished.  Psychiatric: His speech is normal and behavior is normal. Judgment and thought content normal. His mood appears anxious. Thought content is not paranoid and not delusional. Cognition and memory are normal. He expresses no homicidal and no suicidal ideation. He expresses no suicidal plans and no homicidal plans.  Patient is seen and the chart is reviewed. I agree with the findings of the ED PE with no exception.    Assessment/Plan: 1. Patient to be observed in OBS x 23 hours. 2. Will continue detox protocol as written. 3. Will continue any home meds as written. 4. Patient advised to follow up with his PCP for his melena.  MASHBURN,NEIL 09/06/2014, 2:38 PM

## 2014-09-06 NOTE — Progress Notes (Signed)
Pt admitted to Observation Unit for Alcohol Detoxification. Pt alert, oriented and cooperative. Pt presents very anxious, tremulous and depressed. Denies SI/HI, -A/[place]. Verbally contracts for safety. Pt c/o stomach cramps and feeling nervous. Hx sarcoidosis of lung and pancreatitis. Will monitor closely, give medication as ordered and evaluate for stabilization.

## 2014-09-06 NOTE — ED Notes (Signed)
Pt accepted by Gust Rung NP / DR. Dwyane Dee

## 2014-09-06 NOTE — Plan of Care (Signed)
[facility] Observation Crisis Plan  Reason for Crisis Plan:  Substance Abuse   Plan of Care:  Referral for Substance Abuse  Family Support:   [name] [name] sister   Current Living Environment:  Living Arrangements: Non-relatives/Friends  Insurance:   Hospital Account   Name Acct ID Class Status Primary Coverage   [name], [name] [id number] [place] Open None        Guarantor Account (for Hospital Account [id number])   Name Relation to Pt Service Area Active? Acct Type   [name], [name] Self [place]   Address Phone       [address] [place], [place] [address] [phone]) [phone])          Coverage Information (for Hospital Account [id number])   Not on file      Legal Guardian:     Primary Care Provider:  Lucretia [name]., DO  Current Outpatient Providers:  "Not at this time"  Psychiatrist:   "Not at this time"  Counselor/Therapist:   "Not at this time"  Compliant with Medications:  Yes  Additional Information:   [name] [name] 10/11/201511:51 AM

## 2014-09-06 NOTE — ED Notes (Signed)
PT  Accepted to [facility] for OBS. Treatment.

## 2014-09-06 NOTE — Progress Notes (Signed)
D Pt. Denies SI and HI, no complaints of pain or discomfort noted at this time.  A Writer offered support and encouragement, discussed discharge plans with pt.  R Pt. Remains safe on the unit,  Pt. Reports he feels the next step for him will be death if he cannot remain sober.  Pt. Continues to have mild tremors, but reports they have improved from initial admission to ED.  Pt. Has multiple inpatients but has never attended a longer term treatment center per pt..  Pt. Hopes to go to a 90 day program when he discharges from [facility].

## 2014-09-07 ENCOUNTER — Encounter (HOSPITAL_COMMUNITY): Payer: Self-pay | Admitting: *Deleted

## 2014-09-07 MED ORDER — DOXEPIN HCL 25 MG PO CAPS: 25.0000 mg | ORAL_CAPSULE | Freq: Every evening | ORAL | Status: DC | PRN

## 2014-09-07 MED ORDER — ADULT MULTIVITAMIN W/MINERALS CH: 1.0000 | ORAL_TABLET | Freq: Every day | ORAL | Status: DC

## 2014-09-07 MED ORDER — GABAPENTIN 100 MG PO CAPS: 200.0000 mg | ORAL_CAPSULE | Freq: Two times a day (BID) | ORAL | Status: DC

## 2014-09-07 MED ORDER — ESCITALOPRAM OXALATE 10 MG PO TABS: 10.0000 mg | ORAL_TABLET | Freq: Every day | ORAL | Status: DC

## 2014-09-07 NOTE — Discharge Instructions (Signed)
To help you maintain a sober lifestyle, a substance abuse rehabilitation facility or program may be helpful to you.  Consider contacting one of the following treatment providers:  Residential Programs:       [facility]      [place], [place] [address]      [phone]       [facility]      [address] [place], [place] [address]      [phone]       [facility]      [place], [place] [address]      [phone]  Outpatient programs:       [facility]      [address], [place]. [place], [place] [address]      [phone]       [name]      [place], [place] [address]      [phone]

## 2014-09-07 NOTE — Consult Note (Signed)
Case discussed, agree with plan 

## 2014-09-07 NOTE — Discharge Summary (Signed)
[facility] OBS UNIT DISCHARGE SUMMARY   Subjective: Pt seen by Dr. Dwyane Dee, chart reviewed. Pt denies SI, HI, and AVH, contracts for safety. Pt has mild tremor. CIWA below 11 last 4 times and trending at 6, continuing to improve. Pt stable to discharge with outpatient resources and referrals to facilities if desired.   HPI: [name] [name] is an 53 y.o. male.  Patient presented to the [facility] stating he's an alcoholic with hx of pancreatitis. States he wants help with alcohol detox so he can go back to [employer].  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 He stated he was drinking a 12 pack a day for the past 24 hours.  He denies any history of WD seizures and reports currently that he is having nausea, tremors, vomiting, diarrhea, sweats, blurry vision and irritability. He also denies SI/HI or AVH or any other recreational drug abuse.   Axis I: Alcohol Abuse, Major Depression, Recurrent severe, Substance Abuse and Substance Induced Mood Disorder Axis II: Deferred Axis III:  Past Medical History  Diagnosis Date  . Alcohol abuse   . Sarcoidosis of lung   . Reflux   . Depression   . Anxiety   . Pancreatitis   . Carpal tunnel syndrome of right wrist   . GERD (gastroesophageal reflux disease)    Axis IV: other psychosocial or environmental problems and problems related to social environment Axis V: 51-60 moderate symptoms  Psychiatric Specialty Exam: Physical Exam Performed in ED (see below)  ROS  Blood pressure 129/90, pulse 100, temperature 98.4 F (36.9 C), temperature source Oral, resp. rate 18, height 5\' 9"  (1.753 m), weight 61.236 kg (135 lb), SpO2 100.00%.Body mass index is 19.93 kg/(m^2).   General Appearance: Fairly Groomed   Engineer, water:: Fair   Speech: Clear and Coherent   Volume: Decreased   Mood: Anxious, Depressed  Affect: anxious, worried   Thought Process: Coherent and Goal Directed   Orientation: Full (Time, Place, and Person)   Thought Content: symptoms  worries concerns   Suicidal Thoughts: No   Homicidal Thoughts: No   Memory: Immediate; Fair  Recent; Fair  Remote; Fair   Judgement: Fair   Insight: Present   Psychomotor Activity: Restlessness   Concentration: Fair   Recall: [employer] of Knowledge:NA   Language: Fair   Akathisia: No   Handed:   AIMS (if indicated):   Assets: Desire for Improvement   Sleep: Number of Hours: 6.75    Musculoskeletal:  Strength & Muscle Tone: within normal limits  Gait & Station: normal  Patient leans: N/A   Past Medical History  Diagnosis Date  . Alcohol abuse   . Sarcoidosis of lung   . Reflux   . Depression   . Anxiety   . Pancreatitis   . Carpal tunnel syndrome of right wrist   . GERD (gastroesophageal reflux disease)     Past Surgical History  Procedure Laterality Date  . Knee arthroscopy    . Carpel tunnel release    . Elbow surgery      Family History  Problem Relation Age of Onset  . Diabetes Father   . Hypertension Father   . Hypertension Paternal Grandfather   . Alcohol abuse Paternal Grandfather   . Alcohol abuse Maternal Grandfather   . Alcohol abuse Maternal Grandmother   . Alcohol abuse Paternal Grandmother     Social History:  reports that he has quit smoking. His smoking use included Cigars. He does not  have any smokeless tobacco history on file. He reports that he drinks alcohol. He reports that he does not use illicit drugs.  Allergies:  Allergies  Allergen Reactions  . Oxycodone Other (See Comments)    abd pain, stomach cramps     Medications: I have reviewed the patient's current medications.  Results for orders placed during the hospital encounter of 09/04/14 (from the past 48 hour(s))  LIPASE, BLOOD     Status: Abnormal   Collection Time    09/04/14  3:17 PM      Result Value Ref Range   Lipase 196 (*) 11 - 59 U/L  URINE RAPID DRUG SCREEN (HOSP PERFORMED)     Status: None   Collection Time    09/04/14  3:48 PM      Result Value Ref Range    Opiates NONE DETECTED  NONE DETECTED   Cocaine NONE DETECTED  NONE DETECTED   Benzodiazepines NONE DETECTED  NONE DETECTED   Amphetamines NONE DETECTED  NONE DETECTED   Tetrahydrocannabinol NONE DETECTED  NONE DETECTED   Barbiturates NONE DETECTED  NONE DETECTED   Comment:            DRUG SCREEN FOR MEDICAL PURPOSES     ONLY.  IF CONFIRMATION IS NEEDED     FOR ANY PURPOSE, NOTIFY LAB     WITHIN 5 DAYS.                LOWEST DETECTABLE LIMITS     FOR URINE DRUG SCREEN     Drug Class       Cutoff (ng/mL)     Amphetamine      1000     Barbiturate      200     Benzodiazepine   623     Tricyclics       762     Opiates          300     Cocaine          300     THC              54  I-STAT TROPOININ, ED     Status: None   Collection Time    09/04/14  3:48 PM      Result Value Ref Range   Troponin i, poc 0.00  0.00 - 0.08 ng/mL   Comment 3            Comment: Due to the release kinetics of cTnI,     a negative result within the first hours     of the onset of symptoms does not rule out     myocardial infarction with certainty.     If myocardial infarction is still suspected,     repeat the test at appropriate intervals.    Ct Angio Chest Pe W/cm &/or Wo Cm  09/04/2014   CLINICAL DATA:  Chest pain for 1 month  EXAM: CT ANGIOGRAPHY CHEST WITH CONTRAST  TECHNIQUE: Multidetector CT imaging of the chest was performed using the standard protocol during bolus administration of intravenous contrast. Multiplanar CT image reconstructions and MIPs were obtained to evaluate the vascular anatomy.  CONTRAST:  122mL OMNIPAQUE IOHEXOL 350 MG/ML SOLN  COMPARISON:  07/08/2014  FINDINGS: Sagittal images of the spine shows mild degenerative changes thoracic spine. Images of the thoracic inlet are unremarkable. Central airways are patent.  The study is of excellent technical quality. No pulmonary embolus is noted.  Again noted extensive mediastinal  and hilar adenopathy without change from prior exam.  Calcified bilateral hilar lymph nodes are again noted.  Again noted diffuse interstitial prominence and fibrotic changes in upper lobe. Bilateral upper lobes bronchiectasis again noted. Again findings are highly suspicious for sarcoidosis.  No segmental infiltrate or pulmonary edema. The visualized upper abdomen is unremarkable. Heart size within normal limits.  Review of the MIP images confirms the above findings.  IMPRESSION: 1. No pulmonary embolus is noted. 2. Again noted bilateral hilar and mediastinal adenopathy. Stable calcified hilar lymph nodes bilaterally. 3. Again noted extensive fibrotic changes bronchiectasis and interstitial prominence bilateral upper lobes. Findings are highly suspicious for sarcoidosis. Clinical correlation is necessary. No superimposed segmental infiltrate or pulmonary edema.   Electronically Signed   By: Lahoma Crocker M.D.   On: 09/04/2014 20:18   Dg Chest Portable 1 View  09/04/2014   CLINICAL DATA:  History of sarcoidosis, currently presenting with chest pain and difficulty breathing  EXAM: PORTABLE CHEST - 1 VIEW  COMPARISON:  Chest radiograph January 18, 2014 and chest CT July 08, 2014  FINDINGS: There is extensive interstitial thickening with a distinct upper lobe prominence, a finding typically seen with sarcoidosis. Milder scarring is seen elsewhere in the lungs. There is no airspace consolidation. The heart size is within normal limits. Pulmonary vascular is within normal limits. There is mediastinal and hilar adenopathy, stable and somewhat better delineated on CT. There is no new opacity or new adenopathy. No bone lesions.  IMPRESSION: Changes typical of sarcoidosis with adenopathy and upper lobe predominant interstitial disease. No frank consolidation. No new opacity.   Electronically Signed   By: Lowella Grip M.D.   On: 09/04/2014 15:56    Review of Systems  Constitutional: Positive for diaphoresis.  HENT: Negative.   Eyes: Positive for blurred vision.   Respiratory: Negative.   Cardiovascular: Negative.   Gastrointestinal: Positive for nausea, diarrhea, blood in stool and melena (TImes 1 week. Patient also notes there is blood in his semen x 1 week.).  Genitourinary: Negative.   Musculoskeletal: Negative.   Skin: Negative.   Neurological: Positive for tremors. Negative for dizziness and tingling.  Endo/Heme/Allergies: Negative.   Psychiatric/Behavioral: Positive for substance abuse. Negative for depression, suicidal ideas, hallucinations and memory loss. The patient is nervous/anxious and has insomnia.    Blood pressure 129/90, pulse 100, temperature 98.4 F (36.9 C), temperature source Oral, resp. rate 18, height 5\' 9"  (1.753 m), weight 61.236 kg (135 lb), SpO2 100.00%. Physical Exam  Constitutional: He appears well-developed and well-nourished.  Psychiatric: His speech is normal and behavior is normal. Judgment and thought content normal. His mood appears anxious. Thought content is not paranoid and not delusional. Cognition and memory are normal. He expresses no homicidal and no suicidal ideation. He expresses no suicidal plans and no homicidal plans.  Patient is seen and the chart is reviewed. I agree with the findings of the ED PE with no exception.    Plan:  -Discharge home with outpatient resources. -TTS Staff to assist with faxing/calling referrals to inpatient rehab so pt will be on wait list and pending placement. Pt to followup on this from home.  -Resume home medications at discharge  Benjamine Mola, FNP-BC 09/07/2014, 2:19 PM

## 2014-09-07 NOTE — Progress Notes (Signed)
Patient ID: [name] [name], male   DOB: 1961/07/12, 53 y.o.   MRN: [id number] Discharge Note-Seen today by Heloise Purpura NP and consulted with Dr Dwyane Dee and agreed he can be discharged today with outpatient treatment.Reviewed with him his outpatient instructions and he was given three Rx. He verbalized his understanding. All of his personal property was returned to him and he changed into his street clothes in search room and escorted to the lobby. Expressed his appreciation for staffs kindness and is waiting for a staff member from the [facility] to pick him up.

## 2014-09-07 NOTE — Progress Notes (Signed)
Patient ID: [name] [name], male   DOB: Nov 02, 1961, 53 y.o.   MRN: [id number]  D: Patient pleasant on approach this am. Reports here for alcohol detox. CIWA 6 earlier today. Patient tremulous and anxious. Denies depression and contracts for safety. No HI or a/v hallucinations. Presently on ativan protocol. Vistaril for anxiety prn. A: Staff will monitor in OBS unit, give medication and assist with needs while here. R: Cooperative in the unit at this time.

## 2014-09-07 NOTE — Plan of Care (Addendum)
[facility] Observation Crisis Plan  Reason for Crisis Plan:  Substance Abuse   Plan of Care:  Referral for Substance Abuse  Family Support:    None  Current Living Environment:  Living Arrangements: Non-relatives/Friends; pt appears to be homeless.  He has applied for admission to [facility].  Insurance:  [facility]   Name Acct ID Class Status Primary Coverage   Brenda, Cowher [id number] [place] Open None        Guarantor Account (for Hospital Account [id number])   Name Relation to Pt Service Area Active? Acct Type   Dashel, Goines Self [facility] Yes [facility]al [facility]   Address Phone       [address] [place], [place] [address] [phone]) [phone])          Coverage Information (for Hospital Account [id number])   Not on file      Legal Guardian:   Self  Primary Care Provider:  Lucretia Kern., DO; None currently  Current Outpatient Providers:  None currently; pt has received treatment through [facility] in the past.  Psychiatrist:   None currently  Counselor/Therapist:   None currently  Compliant with Medications:  No; pt reports that he stopped taking his medications when he relapsed on alcohol.  Additional Information: After consulting with Catalina Pizza, NP it has been determined that pt does not present a life threatening danger to himself or others, and that psychiatric hospitalization is not indicated for him at this time. He would, however, benefit from admission to a residential substance abuse detoxification or rehabilitation facility if placement can be found in a timely fashion. Pt signed Consent to Release Information to [facility], to [facility] and to [facility].  Pt was declined for admission to [facility] by Melissa due to lack of clinical criteria for their detox program, and lack of beds in their rehab program at this time.  Pt was also declined for admission to [facility] due to a known history of  recidivism and poor follow through with aftercare.  A call was placed to [facility].  They took the pt's name, along with my call back number, and will reportedly call me later for scheduling.  As of this writing call is pending.  Pt is to be discharged with referrals to these facilities, as well as area outpatient providers to follow up at his own initiative.  If [facility] provides a time for a screening appointment prior to pt's discharge, it will also be included.  Furthermore, if [facility] calls back shortly after pt is discharged with an appointment, I will call him at [phone] to notify him.  Jalene Mullet, [place] Triage Specialist [place]be [place] 10/12/20151:20 PM

## 2014-09-22 NOTE — H&P (Signed)
[facility] UNIT H&P   Subjective: Pt seen by Dr. Dwyane Dee and this NP, chart reviewed. Pt denies SI, HI, and AVH, contracts for safety. Pt has mild tremor. CIWA below 11 last 4 times and trending at 6, continuing to improve. Pt stable to discharge with outpatient resources and referrals to facilities if desired.   HPI: [name] [name] is an 53 y.o. male.  Patient presented to the [facility] stating he's an alcoholic with hx of pancreatitis. States he wants help with alcohol detox so he can go back to [employer].  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 He stated he was drinking a 12 pack a day for the past 24 hours.  He denies any history of WD seizures and reports currently that he is having nausea, tremors, vomiting, diarrhea, sweats, blurry vision and irritability. He also denies SI/HI or AVH or any other recreational drug abuse.   Axis I: Alcohol Abuse, Major Depression, Recurrent severe, Substance Abuse and Substance Induced Mood Disorder Axis II: Deferred Axis III:  Past Medical History  Diagnosis Date  . Alcohol abuse   . Sarcoidosis of lung   . Reflux   . Depression   . Anxiety   . Pancreatitis   . Carpal tunnel syndrome of right wrist   . GERD (gastroesophageal reflux disease)    Axis IV: other psychosocial or environmental problems and problems related to social environment Axis V: 51-60 moderate symptoms  Psychiatric Specialty Exam: Physical Exam  ROS  Blood pressure 129/90, pulse 100, temperature 98.4 F (36.9 C), temperature source Oral, resp. rate 18, height 5\' 9"  (1.753 m), weight 61.236 kg (135 lb), SpO2 100.00%.Body mass index is 19.93 kg/(m^2).   General Appearance: Fairly Groomed   Engineer, water:: Fair   Speech: Clear and Coherent   Volume: Decreased   Mood: Anxious, Depressed  Affect: anxious, worried   Thought Process: Coherent and Goal Directed   Orientation: Full (Time, Place, and Person)   Thought Content: symptoms worries concerns    Suicidal Thoughts: No   Homicidal Thoughts: No   Memory: Immediate; Fair  Recent; Fair  Remote; Fair   Judgement: Fair   Insight: Present   Psychomotor Activity: Restlessness   Concentration: Fair   Recall: [employer] of Knowledge:NA   Language: Fair   Akathisia: No   Handed:   AIMS (if indicated):   Assets: Desire for Improvement   Sleep: Number of Hours: 6.75    Musculoskeletal:  Strength & Muscle Tone: within normal limits  Gait & Station: normal  Patient leans: N/A    Past Medical History  Diagnosis Date  . Alcohol abuse   . Sarcoidosis of lung   . Reflux   . Depression   . Anxiety   . Pancreatitis   . Carpal tunnel syndrome of right wrist   . GERD (gastroesophageal reflux disease)     Past Surgical History  Procedure Laterality Date  . Knee arthroscopy    . Carpel tunnel release    . Elbow surgery      Family History  Problem Relation Age of Onset  . Diabetes Father   . Hypertension Father   . Hypertension Paternal Grandfather   . Alcohol abuse Paternal Grandfather   . Alcohol abuse Maternal Grandfather   . Alcohol abuse Maternal Grandmother   . Alcohol abuse Paternal Grandmother     Social History:  reports that he has quit smoking. His smoking use included Cigars. He does not have any  smokeless tobacco history on file. He reports that he drinks alcohol. He reports that he does not use illicit drugs.  Allergies:  Allergies  Allergen Reactions  . Oxycodone Other (See Comments)    abd pain, stomach cramps     Medications: I have reviewed the patient's current medications.  Results for orders placed during the hospital encounter of 09/04/14 (from the past 48 hour(s))  LIPASE, BLOOD     Status: Abnormal   Collection Time    09/04/14  3:17 PM      Result Value Ref Range   Lipase 196 (*) 11 - 59 U/L  URINE RAPID DRUG SCREEN (HOSP PERFORMED)     Status: None   Collection Time    09/04/14  3:48 PM      Result Value Ref Range   Opiates NONE  DETECTED  NONE DETECTED   Cocaine NONE DETECTED  NONE DETECTED   Benzodiazepines NONE DETECTED  NONE DETECTED   Amphetamines NONE DETECTED  NONE DETECTED   Tetrahydrocannabinol NONE DETECTED  NONE DETECTED   Barbiturates NONE DETECTED  NONE DETECTED   Comment:            DRUG SCREEN FOR MEDICAL PURPOSES     ONLY.  IF CONFIRMATION IS NEEDED     FOR ANY PURPOSE, NOTIFY LAB     WITHIN 5 DAYS.                LOWEST DETECTABLE LIMITS     FOR URINE DRUG SCREEN     Drug Class       Cutoff (ng/mL)     Amphetamine      1000     Barbiturate      200     Benzodiazepine   443     Tricyclics       154     Opiates          300     Cocaine          300     THC              55  I-STAT TROPOININ, ED     Status: None   Collection Time    09/04/14  3:48 PM      Result Value Ref Range   Troponin i, poc 0.00  0.00 - 0.08 ng/mL   Comment 3            Comment: Due to the release kinetics of cTnI,     a negative result within the first hours     of the onset of symptoms does not rule out     myocardial infarction with certainty.     If myocardial infarction is still suspected,     repeat the test at appropriate intervals.      Review of Systems  Constitutional: Positive for diaphoresis.  HENT: Negative.   Eyes: Positive for blurred vision.  Respiratory: Negative.   Cardiovascular: Negative.   Gastrointestinal: Positive for nausea, diarrhea, blood in stool and melena  Genitourinary: Negative.   Musculoskeletal: Negative.   Skin: Negative.   Neurological: Positive for tremors. Negative for dizziness and tingling.  Endo/Heme/Allergies: Negative.   Psychiatric/Behavioral: Positive for substance abuse. Negative for depression, suicidal ideas, hallucinations and memory loss. The patient is nervous/anxious and has insomnia.    Blood pressure 129/90, pulse 100, temperature 98.4 F (36.9 C), temperature source Oral, resp. rate 18, height 5\' 9"  (1.753 m), weight 61.236 kg (135 lb), SpO2  100.00%. Physical Exam  Constitutional: He appears well-developed and well-nourished.  Psychiatric: His speech is normal and behavior is normal. Judgment and thought content normal. His mood appears anxious. Thought content is not paranoid and not delusional. Cognition and memory are normal. He expresses no homicidal and no suicidal ideation. He expresses no suicidal plans and no homicidal plans.  Patient is seen and the chart is reviewed. I agree with the findings of the EDP with no exception.    Plan:  -Discharge home with outpatient resources. -TTS Staff to assist with faxing/calling referrals to inpatient rehab so pt will be on wait list and pending placement. Pt to followup on this from home.  -Resume home medications at discharge  Benjamine Mola, FNP-BC 09/07/2014, 12:18 PM

## 2014-11-08 ENCOUNTER — Encounter (HOSPITAL_COMMUNITY): Payer: Self-pay | Admitting: Family Medicine

## 2014-11-08 ENCOUNTER — Emergency Department (HOSPITAL_COMMUNITY): Payer: Self-pay

## 2014-11-08 ENCOUNTER — Inpatient Hospital Stay (HOSPITAL_COMMUNITY)
Admission: EM | Admit: 2014-11-08 | Discharge: 2014-11-10 | DRG: 195 | Disposition: A | Discharge status: Home / Self Care | Payer: Self-pay | Attending: Internal Medicine | Admitting: Internal Medicine

## 2014-11-08 DIAGNOSIS — R05 Cough: Secondary | ICD-10-CM

## 2014-11-08 DIAGNOSIS — R059 Cough, unspecified: Secondary | ICD-10-CM

## 2014-11-08 DIAGNOSIS — Z87891 Personal history of nicotine dependence: Secondary | ICD-10-CM

## 2014-11-08 DIAGNOSIS — G5600 Carpal tunnel syndrome, unspecified upper limb: Secondary | ICD-10-CM | POA: Diagnosis present

## 2014-11-08 DIAGNOSIS — K219 Gastro-esophageal reflux disease without esophagitis: Secondary | ICD-10-CM

## 2014-11-08 DIAGNOSIS — D860 Sarcoidosis of lung: Secondary | ICD-10-CM

## 2014-11-08 DIAGNOSIS — F419 Anxiety disorder, unspecified: Secondary | ICD-10-CM | POA: Diagnosis present

## 2014-11-08 DIAGNOSIS — F329 Major depressive disorder, single episode, unspecified: Secondary | ICD-10-CM | POA: Diagnosis present

## 2014-11-08 DIAGNOSIS — J189 Pneumonia, unspecified organism: Principal | ICD-10-CM

## 2014-11-08 DIAGNOSIS — F1010 Alcohol abuse, uncomplicated: Secondary | ICD-10-CM | POA: Diagnosis present

## 2014-11-08 LAB — CBC
HCT: 38.4 % — ABNORMAL LOW (ref 39.0–52.0)
Hemoglobin: 12.5 g/dL — ABNORMAL LOW (ref 13.0–17.0)
MCH: 25.8 pg — ABNORMAL LOW (ref 26.0–34.0)
MCHC: 32.6 g/dL (ref 30.0–36.0)
MCV: 79.2 fL (ref 78.0–100.0)
Platelets: 228 10*3/uL (ref 150–400)
RBC: 4.85 MIL/uL (ref 4.22–5.81)
RDW: 13.8 % (ref 11.5–15.5)
WBC: 6.7 10*3/uL (ref 4.0–10.5)

## 2014-11-08 LAB — BASIC METABOLIC PANEL
Anion gap: 13 (ref 5–15)
BUN: 13 mg/dL (ref 6–23)
CO2: 23 mEq/L (ref 19–32)
Calcium: 9.3 mg/dL (ref 8.4–10.5)
Chloride: 99 mEq/L (ref 96–112)
Creatinine, Ser: 1.02 mg/dL (ref 0.50–1.35)
GFR calc Af Amer: >90 mL/min (ref >90)
GFR calc non Af Amer: 83 mL/min — ABNORMAL LOW (ref >90)
Glucose, Bld: 83 mg/dL (ref 70–99)
Potassium: 3.8 mEq/L (ref 3.7–5.3)
Sodium: 135 mEq/L — ABNORMAL LOW (ref 137–147)

## 2014-11-08 MED ORDER — DOXEPIN HCL 25 MG PO CAPS
25.0000 mg | ORAL_CAPSULE | Freq: Every evening | ORAL | Status: DC | PRN
  Administered 2014-11-08: 25 mg via ORAL
  Filled 2014-11-08 (×3): qty 1

## 2014-11-08 MED ORDER — PANTOPRAZOLE SODIUM 40 MG PO TBEC
40.0000 mg | DELAYED_RELEASE_TABLET | Freq: Every day | ORAL | Status: DC
Start: 2014-11-09 — End: 2014-11-10
  Administered 2014-11-09 – 2014-11-10 (×2): 40 mg via ORAL
  Filled 2014-11-08 (×2): qty 1

## 2014-11-08 MED ORDER — HYDROMORPHONE HCL 1 MG/ML IJ SOLN: 0.5000 mg | INTRAMUSCULAR | Status: DC | PRN

## 2014-11-08 MED ORDER — HYDROCOD POLST-CHLORPHEN POLST 10-8 MG/5ML PO LQCR
5.0000 mL | Freq: Once | ORAL | Status: AC
  Administered 2014-11-08: 5 mL via ORAL
  Filled 2014-11-08: qty 5

## 2014-11-08 MED ORDER — AZITHROMYCIN 500 MG IV SOLR
500.0000 mg | Freq: Once | INTRAVENOUS | Status: AC
  Administered 2014-11-08: 500 mg via INTRAVENOUS
  Filled 2014-11-08: qty 500

## 2014-11-08 MED ORDER — ESCITALOPRAM OXALATE 10 MG PO TABS
10.0000 mg | ORAL_TABLET | Freq: Every day | ORAL | Status: DC
  Administered 2014-11-09 – 2014-11-10 (×2): 10 mg via ORAL
  Filled 2014-11-08 (×2): qty 1

## 2014-11-08 MED ORDER — ACETAMINOPHEN 325 MG PO TABS
650.0000 mg | ORAL_TABLET | Freq: Four times a day (QID) | ORAL | Status: DC | PRN
  Administered 2014-11-09: 650 mg via ORAL
  Filled 2014-11-08: qty 2

## 2014-11-08 MED ORDER — GABAPENTIN 100 MG PO CAPS
200.0000 mg | ORAL_CAPSULE | Freq: Two times a day (BID) | ORAL | Status: DC
  Administered 2014-11-08 – 2014-11-10 (×4): 200 mg via ORAL
  Filled 2014-11-08 (×5): qty 2

## 2014-11-08 MED ORDER — ENOXAPARIN SODIUM 40 MG/0.4ML ~~LOC~~ SOLN
40.0000 mg | SUBCUTANEOUS | Status: DC
  Administered 2014-11-09: 40 mg via SUBCUTANEOUS
  Filled 2014-11-08 (×2): qty 0.4

## 2014-11-08 MED ORDER — ALUM & MAG HYDROXIDE-SIMETH 200-200-20 MG/5ML PO SUSP: 30.0000 mL | Freq: Four times a day (QID) | ORAL | Status: DC | PRN

## 2014-11-08 MED ORDER — ONDANSETRON HCL 4 MG/2ML IJ SOLN: 4.0000 mg | Freq: Four times a day (QID) | INTRAMUSCULAR | Status: DC | PRN

## 2014-11-08 MED ORDER — HYDROCOD POLST-CHLORPHEN POLST 10-8 MG/5ML PO LQCR
5.0000 mL | Freq: Two times a day (BID) | ORAL | Status: DC
  Administered 2014-11-08 – 2014-11-10 (×4): 5 mL via ORAL
  Filled 2014-11-08 (×4): qty 5

## 2014-11-08 MED ORDER — DEXTROSE 5 % IV SOLN
1.0000 g | Freq: Once | INTRAVENOUS | Status: AC
  Administered 2014-11-08: 1 g via INTRAVENOUS
  Filled 2014-11-08: qty 10

## 2014-11-08 MED ORDER — ONDANSETRON HCL 4 MG PO TABS: 4.0000 mg | ORAL_TABLET | Freq: Four times a day (QID) | ORAL | Status: DC | PRN

## 2014-11-08 MED ORDER — PANTOPRAZOLE SODIUM 40 MG PO TBEC
40.0000 mg | DELAYED_RELEASE_TABLET | Freq: Once | ORAL | Status: AC
  Administered 2014-11-08: 40 mg via ORAL
  Filled 2014-11-08: qty 1

## 2014-11-08 MED ORDER — ALBUTEROL SULFATE (2.5 MG/3ML) 0.083% IN NEBU
2.5000 mg | INHALATION_SOLUTION | Freq: Four times a day (QID) | RESPIRATORY_TRACT | Status: DC
  Administered 2014-11-08 – 2014-11-09 (×3): 2.5 mg via RESPIRATORY_TRACT
  Filled 2014-11-08 (×3): qty 3

## 2014-11-08 MED ORDER — DEXTROSE 5 % IV SOLN
500.0000 mg | INTRAVENOUS | Status: DC
  Administered 2014-11-09: 500 mg via INTRAVENOUS
  Filled 2014-11-08 (×2): qty 500

## 2014-11-08 MED ORDER — DEXTROSE 5 % IV SOLN
1.0000 g | INTRAVENOUS | Status: DC
  Administered 2014-11-09 – 2014-11-10 (×2): 1 g via INTRAVENOUS
  Filled 2014-11-08 (×2): qty 10

## 2014-11-08 MED ORDER — ADULT MULTIVITAMIN W/MINERALS CH
1.0000 | ORAL_TABLET | Freq: Every day | ORAL | Status: DC
  Administered 2014-11-09 – 2014-11-10 (×2): 1 via ORAL
  Filled 2014-11-08 (×2): qty 1

## 2014-11-08 MED ORDER — SODIUM CHLORIDE 0.9 % IV SOLN
INTRAVENOUS | Status: DC
  Administered 2014-11-08 – 2014-11-10 (×3): via INTRAVENOUS

## 2014-11-08 MED ORDER — HYDROXYZINE HCL 25 MG PO TABS: 25.0000 mg | ORAL_TABLET | Freq: Three times a day (TID) | ORAL | Status: DC | PRN

## 2014-11-08 MED ORDER — ACETAMINOPHEN 650 MG RE SUPP: 650.0000 mg | Freq: Four times a day (QID) | RECTAL | Status: DC | PRN

## 2014-11-08 NOTE — H&P (Signed)
[facility] Admission History and Physical       Mitesh Rosendahl XVQ:[id number] DOB: [birth year] DOA: 11/08/2014  Referring physician:  EDP PCP: Lucretia Kern., DO  Specialists:   Chief Complaint:  SOB and Worsening Cough  HPI: [name] [name] is a 53 y.o. 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  He reports having a productive cough , yellow and white.   He was found to have a multilobar pneumonia on Chest X-ray and was started on IV antibiotics for CAP.      Review of Systems:  Constitutional: No Weight Loss, No Weight Gain, Night Sweats, +Fevers, +Chills, Dizziness, Fatigue, or Generalized Weakness HEENT: No Headaches, Difficulty Swallowing,Tooth/Dental Problems,Sore Throat,  No Sneezing, Rhinitis, Ear Ache, Nasal Congestion, or Post Nasal Drip,  Cardio-vascular:  No Chest pain, Orthopnea, PND, Edema in Lower Extremities, Anasarca, Dizziness, Palpitations  Resp: +Dyspnea, No DOE, +Productive Cough, No Non-Productive Cough, No Hemoptysis, No Wheezing.    GI: No Heartburn, Indigestion, Abdominal Pain, Nausea, Vomiting, Diarrhea, Hematemesis, Hematochezia, Melena, Change in Bowel Habits,  Loss of Appetite  GU: No Dysuria, Change in Color of Urine, No Urgency or Frequency, No Flank pain.  Musculoskeletal: No Joint Pain or Swelling, No Decreased Range of Motion, No Back Pain.  Neurologic: No Syncope, No Seizures, Muscle Weakness, Paresthesia, Vision Disturbance or Loss, No Diplopia, No Vertigo, No Difficulty Walking,  Skin: No Rash or Lesions. Psych: No Change in Mood or Affect, No Depression or Anxiety, No Memory loss, No Confusion, or Hallucinations   Past Medical History  Diagnosis Date  . Alcohol abuse   . Sarcoidosis of lung   . Reflux   . Depression   . Anxiety   . Pancreatitis   . Carpal tunnel syndrome of right wrist   .  GERD (gastroesophageal reflux disease)       Past Surgical History  Procedure Laterality Date  . Knee arthroscopy    . Carpel tunnel release    . Elbow surgery         Prior to Admission medications   Medication Sig Start Date End Date Taking? Authorizing Provider  doxepin (SINEQUAN) 25 MG capsule Take 1 capsule (25 mg total) by mouth at bedtime as needed (Insomnia). 09/07/14  Yes Benjamine Mola, FNP  escitalopram (LEXAPRO) 10 MG tablet Take 1 tablet (10 mg total) by mouth daily. For depression 09/07/14  Yes Benjamine Mola, FNP  gabapentin (NEURONTIN) 100 MG capsule Take 2 capsules (200 mg total) by mouth 2 (two) times daily. For Substance withdrawal syndrome 09/07/14  Yes Benjamine Mola, FNP  hydrOXYzine (ATARAX/VISTARIL) 25 MG tablet Take 1 tablet (25 mg) three times daily as needed for anxiety 06/03/14  Yes Encarnacion Slates, NP  Multiple Vitamin (MULTIVITAMIN WITH MINERALS) TABS tablet Take 1 tablet by mouth daily. For low vitamin 09/07/14  Yes Benjamine Mola, FNP  pantoprazole (PROTONIX) 40 MG tablet Take 1 tablet (40 mg total) by mouth daily. For acid reflux 06/03/14  Yes Encarnacion Slates, NP      Allergies  Allergen Reactions  . Oxycodone Other (See Comments)    abd pain, stomach cramps      Social History:  reports that he has quit smoking. His smoking use included Cigars. He does not have any smokeless tobacco history on file. He reports that he drinks alcohol. He  reports that he does not use illicit drugs.     Family History  Problem Relation Age of Onset  . Diabetes Father   . Hypertension Father   . Hypertension Paternal Grandfather   . Alcohol abuse Paternal Grandfather   . Alcohol abuse Maternal Grandfather   . Alcohol abuse Maternal Grandmother   . Alcohol abuse Paternal Grandmother        Physical Exam:  GEN:  Pleasant  53 y.o. male  examined  and in no acute distress; cooperative with exam Filed Vitals:   11/08/14 1712 11/08/14 1931 11/08/14 2000 11/08/14  2015  BP: 135/86 139/97 141/92 128/87  Pulse: 102 76 68 69  Temp: 98.7 F (37.1 C) 98.8 F (37.1 C)    TempSrc: Oral Oral    Resp: 28     Height: 5\' 9"  (1.753 m)     Weight: 61.236 kg (135 lb)     SpO2: 99% 100% 96% 97%   Blood pressure 128/87, pulse 69, temperature 98.8 F (37.1 C), temperature source Oral, resp. rate 28, height 5\' 9"  (1.753 m), weight 61.236 kg (135 lb), SpO2 97 %. PSYCH: He is alert and oriented x4; does not appear anxious does not appear depressed; affect is normal HEENT: Normocephalic and Atraumatic, Mucous membranes pink; PERRLA; EOM intact; Fundi:  Benign;  No scleral icterus, Nares: Patent, Oropharynx: Clear, Fair Dentition,    Neck:  FROM, No Cervical Lymphadenopathy nor Thyromegaly or Carotid Bruit; No JVD; Breasts:: Not examined CHEST WALL: No tenderness CHEST: Normal respiration, clear to auscultation bilaterally HEART: Regular rate and rhythm; no murmurs rubs or gallops BACK: No kyphosis or scoliosis; No CVA tenderness ABDOMEN: Positive Bowel Sounds, Scaphoid, Soft Non-Tender; No Masses, No Organomegaly Rectal Exam: Not done EXTREMITIES: No Cyanosis, Clubbing, or Edema; No Ulcerations. Genitalia: not examined PULSES: 2+ and symmetric SKIN: Normal hydration no rash or ulceration CNS:  Alert and Oriented x 4, No Focal Deficits Vascular: pulses palpable throughout    Labs on Admission:  Basic Metabolic Panel:  Recent Labs Lab 11/08/14 1935  NA 135*  K 3.8  CL 99  CO2 23  GLUCOSE 83  BUN 13  CREATININE 1.02  CALCIUM 9.3   Liver Function Tests: No results for input(s): AST, ALT, ALKPHOS, BILITOT, PROT, ALBUMIN in the last 168 hours. No results for input(s): LIPASE, AMYLASE in the last 168 hours. No results for input(s): AMMONIA in the last 168 hours. CBC:  Recent Labs Lab 11/08/14 1935  WBC 6.7  HGB 12.5*  HCT 38.4*  MCV 79.2  PLT 228   Cardiac Enzymes: No results for input(s): CKTOTAL, CKMB, CKMBINDEX, TROPONINI in the last 168  hours.  BNP (last 3 results) No results for input(s): PROBNP in the last 8760 hours. CBG: No results for input(s): GLUCAP in the last 168 hours.  Radiological Exams on Admission: Dg Chest 2 View  11/08/2014   CLINICAL DATA:  Productive cough for 3 weeks. Midline chest pain. History of sarcoidosis.  EXAM: CHEST  2 VIEW  COMPARISON:  Chest CT 09/04/2014, chest x-ray 09/04/2014  FINDINGS: Heart size is normal. Mediastinal adenopathy is again identified. There is significant bilateral upper lobe parenchymal disease consistent with the history of sarcoidosis. There is slight increased density in both upper lobes. Superimposed infectious process cannot be entirely excluded. No pleural effusions.  IMPRESSION: 1. Chronic adenopathy and upper lobe fibrosis. 2. Question of superimposed infectious infiltrate in the upper lobes bilaterally.   Electronically Signed   By: Damien Fusi.D.  On: 11/08/2014 18:54      Assessment/Plan:   53 y.o. male with   Active Problems:   1.   CAP (community acquired pneumonia)/ Pneumonia   IV rocephin and Azithromycin   Albuterol  Nebs   Tussionex BID   O2 PRN   Monitor O2 Sats   Droplet Isolation     2.   Sarcoidosis of lung   +Adenopathy on Chest X-Ray     3.   GERD (gastroesophageal reflux disease)   Protonix      4.   DVT Prophylaxis   Lovenox      Code Status:   FULL CODE Family Communication:    No Family present Disposition Plan:   Inpatient      Time spent:   [place] [facility] Pager [phone]   If [place] Please Contact the Day Rounding Team MD for [facility]  If 7PM-7AM, Please Contact Night-Floor Coverage  [email or web address] Password [facility] 11/08/2014, 9:37 PM

## 2014-11-08 NOTE — Progress Notes (Signed)
NURSING PROGRESS NOTE  [name] [name] [id number] Admission Data: 11/08/2014 11:39 PM Attending Provider: Theressa Millard, MD PCP:KIM, Nickola Major., DO Code Status: full  [name] [name] is a 53 y.o. male patient admitted from ED:  -No acute distress noted.  -No complaints of shortness of breath.  -No complaints of chest pain.   Cardiac Monitoring: N/A  Blood pressure 132/99, pulse 81, temperature 98.4 F (36.9 C), temperature source Oral, resp. rate 20, height 5' 10.8" (1.798 m), weight 65.363 kg (144 lb 1.6 oz), SpO2 94 %.   IV Fluids:  IV in place, occlusive dsg intact without redness, IV cath in [facility] Allergies:  Oxycodone  Past Medical History:   has a past medical history of Alcohol abuse; Sarcoidosis of lung; Reflux; Depression; Anxiety; Pancreatitis; Carpal tunnel syndrome of right wrist; and GERD (gastroesophageal reflux disease).  Past Surgical History:   has past surgical history that includes Knee arthroscopy; carpel tunnel release; and Elbow surgery.  Social History:   reports that he has quit smoking. His smoking use included Cigars. He does not have any smokeless tobacco history on file. He reports that he drinks alcohol. He reports that he does not use illicit drugs.  Skin: Intact  Patient/Family orientated to room. Information packet given to patient/family. Admission inpatient armband information verified with patient/family to include name and date of birth and placed on patient arm. Side rails up x 2, fall assessment and education completed with patient/family. Patient/family able to verbalize understanding of risk associated with falls and verbalized understanding to call for assistance before getting out of bed. Call light within reach. Patient/family able to voice and demonstrate understanding of unit orientation instructions.

## 2014-11-08 NOTE — ED Notes (Signed)
hospitalist at bedside

## 2014-11-08 NOTE — ED Notes (Signed)
Per pt sts worsening cough over the past few months and runny nose. sts productive cough.

## 2014-11-08 NOTE — Progress Notes (Signed)
Report received from Emmit Pomfret, RN. Pt will be admitted to room 2.

## 2014-11-08 NOTE — Progress Notes (Signed)
Admitting MD notified pt's arrival  via text page.

## 2014-11-08 NOTE — ED Provider Notes (Signed)
CSN: [id number]     Arrival date & time 11/08/14  1701 History   First MD Initiated Contact with Patient 11/08/14 1755     Chief Complaint  Patient presents with  . Cough     (Consider location/radiation/quality/duration/timing/severity/associated sxs/prior Treatment) Patient is a 53 y.o. male presenting with cough. The history is provided by the patient.  Cough Cough characteristics:  Productive Severity:  Moderate Onset quality:  Gradual Duration:  4 weeks Timing:  Constant Progression:  Worsening Chronicity:  New Smoker: no   Context: upper respiratory infection and weather changes   Relieved by:  Nothing Worsened by:  Nothing tried Ineffective treatments:  Cough suppressants Associated symptoms: no fever and no shortness of breath     Past Medical History  Diagnosis Date  . Alcohol abuse   . Sarcoidosis of lung   . Reflux   . Depression   . Anxiety   . Pancreatitis   . Carpal tunnel syndrome of right wrist   . GERD (gastroesophageal reflux disease)    Past Surgical History  Procedure Laterality Date  . Knee arthroscopy    . Carpel tunnel release    . Elbow surgery     Family History  Problem Relation Age of Onset  . Diabetes Father   . Hypertension Father   . Hypertension Paternal Grandfather   . Alcohol abuse Paternal Grandfather   . Alcohol abuse Maternal Grandfather   . Alcohol abuse Maternal Grandmother   . Alcohol abuse Paternal Grandmother    History  Substance Use Topics  . Smoking status: Former Smoker    Types: Cigars  . Smokeless tobacco: Not on file  . Alcohol Use: 0.0 oz/week     Comment: Patient drinks at minimum two 40oz daily     Review of Systems  Constitutional: Negative for fever.  Respiratory: Positive for cough. Negative for shortness of breath.   All other systems reviewed and are negative.     Allergies  Oxycodone  Home Medications   Prior to Admission medications   Medication Sig Start Date End Date Taking?  Authorizing Provider  doxepin (SINEQUAN) 25 MG capsule Take 1 capsule (25 mg total) by mouth at bedtime as needed (Insomnia). 09/07/14   Benjamine Mola, FNP  escitalopram (LEXAPRO) 10 MG tablet Take 1 tablet (10 mg total) by mouth daily. For depression 09/07/14   Benjamine Mola, FNP  gabapentin (NEURONTIN) 100 MG capsule Take 2 capsules (200 mg total) by mouth 2 (two) times daily. For Substance withdrawal syndrome 09/07/14   Benjamine Mola, FNP  hydrOXYzine (ATARAX/VISTARIL) 25 MG tablet Take 1 tablet (25 mg) three times daily as needed for anxiety 06/03/14   Encarnacion Slates, NP  Multiple Vitamin (MULTIVITAMIN WITH MINERALS) TABS tablet Take 1 tablet by mouth daily. For low vitamin 09/07/14   Benjamine Mola, FNP  pantoprazole (PROTONIX) 40 MG tablet Take 1 tablet (40 mg total) by mouth daily. For acid reflux 06/03/14   Encarnacion Slates, NP   BP 135/86 mmHg  Pulse 102  Temp(Src) 98.7 F (37.1 C) (Oral)  Resp 28  Ht 5\' 9"  (1.753 m)  Wt 135 lb (61.236 kg)  BMI 19.93 kg/m2  SpO2 99% Physical Exam  Constitutional: He is oriented to person, place, and time. He appears well-developed and well-nourished. No distress.  HENT:  Head: Normocephalic and atraumatic.  Mouth/Throat: No oropharyngeal exudate.  Eyes: EOM are normal. Pupils are equal, round, and reactive to light.  Neck: Normal range of motion.  Neck supple.  Cardiovascular: Normal rate and regular rhythm.  Exam reveals no friction rub.   No murmur heard. Pulmonary/Chest: Effort normal and breath sounds normal. No respiratory distress. He has no wheezes. He has no rales.  Abdominal: He exhibits no distension. There is no tenderness. There is no rebound.  Musculoskeletal: Normal range of motion. He exhibits no edema.  Neurological: He is alert and oriented to person, place, and time.  Skin: He is not diaphoretic.    ED Course  Procedures (including critical care time) Labs Review Labs Reviewed  CBC - Abnormal; Notable for the following:     Hemoglobin 12.5 (*)    HCT 38.4 (*)    MCH 25.8 (*)    All other components within normal limits  BASIC METABOLIC PANEL - Abnormal; Notable for the following:    Sodium 135 (*)    GFR calc non Af Amer 83 (*)    All other components within normal limits  CULTURE, BLOOD (ROUTINE X 2)  CULTURE, BLOOD (ROUTINE X 2)  BORDETELLA PERTUSSIS ANTIBODY  BASIC METABOLIC PANEL  CBC    Imaging Review Dg Chest 2 View  11/08/2014   CLINICAL DATA:  Productive cough for 3 weeks. Midline chest pain. History of sarcoidosis.  EXAM: CHEST  2 VIEW  COMPARISON:  Chest CT 09/04/2014, chest x-ray 09/04/2014  FINDINGS: Heart size is normal. Mediastinal adenopathy is again identified. There is significant bilateral upper lobe parenchymal disease consistent with the history of sarcoidosis. There is slight increased density in both upper lobes. Superimposed infectious process cannot be entirely excluded. No pleural effusions.  IMPRESSION: 1. Chronic adenopathy and upper lobe fibrosis. 2. Question of superimposed infectious infiltrate in the upper lobes bilaterally.   Electronically Signed   By: Shon Hale M.D.   On: 11/08/2014 18:54     EKG Interpretation None      MDM   Final diagnoses:  Bilateral pneumonia    32M here with persistent coughing for past 4 weeks. Had a URI, didn't get better. Weather changes made symptoms worse. AFVSS. Has violent paroxysms of cough here. No respiratory distress. Will check CXR, check pertussis lab. Cough medicine and PPI given. CXR shows bilateral upper lobe pneumonia. Concern with multilobar PNA and no resources, will need inpatient treatment. Admitted [facility].   Evelina Bucy, MD 11/09/14 [phone]

## 2014-11-09 LAB — BASIC METABOLIC PANEL
Anion gap: 10 (ref 5–15)
BUN: 10 mg/dL (ref 6–23)
CO2: 27 mEq/L (ref 19–32)
Calcium: 9 mg/dL (ref 8.4–10.5)
Chloride: 102 mEq/L (ref 96–112)
Creatinine, Ser: 1.1 mg/dL (ref 0.50–1.35)
GFR calc Af Amer: 87 mL/min — ABNORMAL LOW (ref >90)
GFR calc non Af Amer: 75 mL/min — ABNORMAL LOW (ref >90)
Glucose, Bld: 84 mg/dL (ref 70–99)
Potassium: 3.9 mEq/L (ref 3.7–5.3)
Sodium: 139 mEq/L (ref 137–147)

## 2014-11-09 LAB — CBC
HCT: 39.2 % (ref 39.0–52.0)
Hemoglobin: 12.2 g/dL — ABNORMAL LOW (ref 13.0–17.0)
MCH: 25.1 pg — ABNORMAL LOW (ref 26.0–34.0)
MCHC: 31.1 g/dL (ref 30.0–36.0)
MCV: 80.5 fL (ref 78.0–100.0)
Platelets: 219 10*3/uL (ref 150–400)
RBC: 4.87 MIL/uL (ref 4.22–5.81)
RDW: 13.9 % (ref 11.5–15.5)
WBC: 4.9 10*3/uL (ref 4.0–10.5)

## 2014-11-09 LAB — MRSA PCR SCREENING: MRSA by PCR: NEGATIVE

## 2014-11-09 MED ORDER — ALBUTEROL SULFATE (2.5 MG/3ML) 0.083% IN NEBU
2.5000 mg | INHALATION_SOLUTION | Freq: Two times a day (BID) | RESPIRATORY_TRACT | Status: DC
  Administered 2014-11-09 – 2014-11-10 (×2): 2.5 mg via RESPIRATORY_TRACT
  Filled 2014-11-09 (×2): qty 3

## 2014-11-09 MED ORDER — ALBUTEROL SULFATE (2.5 MG/3ML) 0.083% IN NEBU: 2.5000 mg | INHALATION_SOLUTION | RESPIRATORY_TRACT | Status: DC | PRN

## 2014-11-09 MED ORDER — INFLUENZA VAC SPLIT QUAD 0.5 ML IM SUSY
0.5000 mL | PREFILLED_SYRINGE | INTRAMUSCULAR | Status: AC
  Administered 2014-11-10: 0.5 mL via INTRAMUSCULAR
  Filled 2014-11-09: qty 0.5

## 2014-11-09 MED ORDER — PNEUMOCOCCAL VAC POLYVALENT 25 MCG/0.5ML IJ INJ
0.5000 mL | INJECTION | INTRAMUSCULAR | Status: AC
  Administered 2014-11-10: 0.5 mL via INTRAMUSCULAR
  Filled 2014-11-09: qty 0.5

## 2014-11-09 NOTE — Progress Notes (Signed)
[facility] PROGRESS NOTE  [name] [name] TKP:[id number] DOB: 1961-04-24 DOA: 11/08/2014 PCP: Lucretia Kern., DO  Assessment/Plan: 1-PNA, Community acquired;  Continue with ceftriaxone and Azithromycin day 2.  Check HIV.  Breathing better, still coughing a lot.  Nebulizer treatments.  Blood culture pending.   2-Sarcoidosis, chronic lymphadenopathy; he will need follow up with PCP/pulmonologist.   3-GERD; Protonix.   4-DVT prophylaxis; Lovenox.   Code Status: Full Code.  Family Communication: Care discussed with patient Disposition Plan: Remain inpatient    Consultants:  none  Procedures:  none  Antibiotics:  Azithromycin 12-13  Ceftriaxone 12-13  HPI/Subjective: Feeling better, still coughing a lot.   Objective: Filed Vitals:   11/09/14 1008  BP: 134/76  Pulse: 80  Temp: 98.4 F (36.9 C)  Resp: 15    Intake/Output Summary (Last 24 hours) at 11/09/14 1038 Last data filed at 11/09/14 1019  Gross per 24 hour  Intake    740 ml  Output    940 ml  Net   -200 ml   Filed Weights   11/08/14 1712 11/08/14 2232  Weight: 61.236 kg (135 lb) 65.363 kg (144 lb 1.6 oz)    Exam:   General:  Alert in no distress.   Cardiovascular: S 1, S 2 RRR  Respiratory: few ronchus bilaterally  Abdomen: BS presents, soft,nt  Musculoskeletal: no edema.   Data Reviewed: Basic Metabolic Panel:  Recent Labs Lab 11/08/14 1935 11/09/14 0550  NA 135* 139  K 3.8 3.9  CL 99 102  CO2 23 27  GLUCOSE 83 84  BUN 13 10  CREATININE 1.02 1.10  CALCIUM 9.3 9.0   Liver Function Tests: No results for input(s): AST, ALT, ALKPHOS, BILITOT, PROT, ALBUMIN in the last 168 hours. No results for input(s): LIPASE, AMYLASE in the last 168 hours. No results for input(s): AMMONIA in the last 168 hours. CBC:  Recent Labs Lab 11/08/14 1935 11/09/14 0550  WBC 6.7 4.9  HGB 12.5* 12.2*  HCT 38.4* 39.2  MCV 79.2 80.5  PLT 228 219   Cardiac Enzymes: No results for  input(s): CKTOTAL, CKMB, CKMBINDEX, TROPONINI in the last 168 hours. BNP (last 3 results) No results for input(s): PROBNP in the last 8760 hours. CBG: No results for input(s): GLUCAP in the last 168 hours.  Recent Results (from the past 240 hour(s))  MRSA PCR Screening     Status: None   Collection Time: 11/09/14  7:39 AM  Result Value Ref Range Status   MRSA by PCR NEGATIVE NEGATIVE Final    Comment:        The GeneXpert MRSA Assay (FDA approved for NASAL specimens only), is one component of a comprehensive MRSA colonization surveillance program. It is not intended to diagnose MRSA infection nor to guide or monitor treatment for MRSA infections.      Studies: Dg Chest 2 View  11/08/2014   CLINICAL DATA:  Productive cough for 3 weeks. Midline chest pain. History of sarcoidosis.  EXAM: CHEST  2 VIEW  COMPARISON:  Chest CT 09/04/2014, chest x-ray 09/04/2014  FINDINGS: Heart size is normal. Mediastinal adenopathy is again identified. There is significant bilateral upper lobe parenchymal disease consistent with the history of sarcoidosis. There is slight increased density in both upper lobes. Superimposed infectious process cannot be entirely excluded. No pleural effusions.  IMPRESSION: 1. Chronic adenopathy and upper lobe fibrosis. 2. Question of superimposed infectious infiltrate in the upper lobes bilaterally.   Electronically Signed   By: Damien Fusi.D.  On: 11/08/2014 18:54    Scheduled Meds: . albuterol  2.5 mg Nebulization BID  . azithromycin  500 mg Intravenous Q24H  . cefTRIAXone (ROCEPHIN)  IV  1 g Intravenous Q24H  . chlorpheniramine-HYDROcodone  5 mL Oral Q12H  . enoxaparin (LOVENOX) injection  40 mg Subcutaneous Q24H  . escitalopram  10 mg Oral Daily  . gabapentin  200 mg Oral BID  . multivitamin with minerals  1 tablet Oral Daily  . pantoprazole  40 mg Oral Daily   Continuous Infusions: . sodium chloride 100 mL/hr at 11/09/14 0900    Active Problems:   CAP  (community acquired pneumonia)   Pneumonia   Sarcoidosis of lung   GERD (gastroesophageal reflux disease)    Time spent: 35 minutes.     Niel Hummer A  [facility] Pager [phone]. If 7PM-7AM, please contact night-coverage at [email or web address], password [facility] 11/09/2014, 10:38 AM  LOS: 1 day

## 2014-11-09 NOTE — Care Management Note (Signed)
    Page 1 of 1   11/10/2014     3:29:23 PM CARE MANAGEMENT NOTE 11/10/2014  Patient:  [name] [name], [name] [name]   Account Number:  [id number]  Date Initiated:  11/09/2014  Documentation initiated by:  Tomi Bamberger  Subjective/Objective Assessment:   dx pna (multilobular)  admit- from [facility].     Action/Plan:   Anticipated DC Date:  11/10/2014   Anticipated DC Plan:  [place]  CM consult  Follow-up appt scheduled  Medication Assistance      Choice offered to / List presented to:             Status of service:  Completed, signed off Medicare Important Message given?  NO (If response is "NO", the following Medicare IM given date fields will be blank) Date Medicare IM given:   Medicare IM given by:   Date Additional Medicare IM given:   Additional Medicare IM given by:    Discharge Disposition:  HOME/SELF CARE  Per UR Regulation:  Reviewed for med. necessity/level of care/duration of stay  If discussed at [place] of Stay Meetings, dates discussed:    Comments:  11/10/14 Tina, BSN [phone] patient for dc today, NCM gave patient paper work for [facility] clinic and information about medications to get from pharmacy at [facility] clinic.  Patient has transportation at dc. [place] rep will help pay for meds.  11/09/14 Gilchrist, BSN [phone] patient is from [facility], patient will need med ast at dc, and follow up apt.

## 2014-11-10 LAB — HIV ANTIBODY (ROUTINE TESTING W REFLEX): HIV 1&2 Ab, 4th Generation: NONREACTIVE

## 2014-11-10 MED ORDER — AZITHROMYCIN 500 MG PO TABS
500.0000 mg | ORAL_TABLET | Freq: Every day | ORAL | Status: DC
  Administered 2014-11-10: 500 mg via ORAL
  Filled 2014-11-10: qty 1

## 2014-11-10 MED ORDER — ALBUTEROL SULFATE HFA 108 (90 BASE) MCG/ACT IN AERS: 2.0000 | INHALATION_SPRAY | Freq: Four times a day (QID) | RESPIRATORY_TRACT | Status: DC | PRN

## 2014-11-10 MED ORDER — GUAIFENESIN 200 MG PO TABS: 200.0000 mg | ORAL_TABLET | ORAL | Status: DC | PRN

## 2014-11-10 MED ORDER — POLYETHYLENE GLYCOL 3350 17 G PO PACK: 17.0000 g | PACK | Freq: Every day | ORAL | Status: DC

## 2014-11-10 MED ORDER — LEVOFLOXACIN 750 MG PO TABS: 750.0000 mg | ORAL_TABLET | Freq: Every day | ORAL | Status: DC

## 2014-11-10 MED ORDER — POLYETHYLENE GLYCOL 3350 17 G PO PACK
17.0000 g | PACK | Freq: Every day | ORAL | Status: DC
  Filled 2014-11-10: qty 1

## 2014-11-10 MED ORDER — AZITHROMYCIN 500 MG PO TABS
500.0000 mg | ORAL_TABLET | Freq: Every day | ORAL | Status: DC
  Filled 2014-11-10: qty 1

## 2014-11-10 NOTE — Discharge Summary (Signed)
Physician Discharge Summary  [name] [name] GUY:[id number] DOB: 07-Nov-1961 DOA: 11/08/2014  PCP: Colin Benton R., DO  Admit date: 11/08/2014 Discharge date: 11/10/2014  Time spent: 35 minutes  Recommendations for Outpatient Follow-up:  1. Need to establish care, referral to pulmonologist for sarcoidosis.  2. Need follow up for resolution of PNA.    Discharge Diagnoses:    CAP (community acquired pneumonia)   Pneumonia   Sarcoidosis of lung   GERD (gastroesophageal reflux disease)   Discharge Condition: Stable.   Diet recommendation: Regular diet  Filed Weights   11/08/14 1712 11/08/14 2232  Weight: 61.236 kg (135 lb) 65.363 kg (144 lb 1.6 oz)    History of present illness:  [name] [name] is a 53 y.o. 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 He reports having a productive cough , yellow and white. He was found to have a multilobar pneumonia on Chest X-ray and was started on IV antibiotics for CAP.  Hospital Course:  1-PNA, Community acquired;  Received ceftriaxone and Azithromycin for 3 days.  He will be discharge on Levaquin for 5 more days.   HIV non reactive.   Breathing better, still with productive cough.  Nebulizer treatments.  Blood culture no growth to date.   2-Sarcoidosis, chronic lymphadenopathy; he will need follow up with PCP/pulmonologist.   3-GERD; Protonix.   4-DVT prophylaxis; Lovenox.   Procedures:  none  Consultations:  none  Discharge Exam: Filed Vitals:   11/10/14 0601  BP: 135/85  Pulse: 64  Temp: 98.1 F (36.7 C)  Resp: 12    General: Alert in no distress.  Cardiovascular: S 1, S 2 RRR Respiratory: bilateral sporadic ronchus  Discharge Instructions You were cared for by a hospitalist during your hospital stay. If you have any questions about your discharge medications or the  care you received while you were in the hospital after you are discharged, you can call the unit and asked to speak with the hospitalist on call if the hospitalist that took care of you is not available. Once you are discharged, your primary care physician will handle any further medical issues. Please note that NO REFILLS for any discharge medications will be authorized once you are discharged, as it is imperative that you return to your primary care physician (or establish a relationship with a primary care physician if you do not have one) for your aftercare needs so that they can reassess your need for medications and monitor your lab values.  Discharge Instructions    Diet - low sodium heart healthy    Complete by:  As directed      Increase activity slowly    Complete by:  As directed           Current Discharge Medication List    START taking these medications   Details  guaiFENesin 200 MG tablet Take 1 tablet (200 mg total) by mouth every 4 (four) hours as needed for cough or to loosen phlegm. Qty: 30 suppository, Refills: 0    levofloxacin (LEVAQUIN) 750 MG tablet Take 1 tablet (750 mg total) by mouth daily. Qty: 5 tablet, Refills: 0    polyethylene glycol (MIRALAX / GLYCOLAX) packet Take 17 g by mouth daily. Qty: 14 each, Refills: 0      CONTINUE these medications which have NOT CHANGED   Details  doxepin (SINEQUAN)  25 MG capsule Take 1 capsule (25 mg total) by mouth at bedtime as needed (Insomnia). Qty: 14 capsule, Refills: 0    escitalopram (LEXAPRO) 10 MG tablet Take 1 tablet (10 mg total) by mouth daily. For depression Qty: 30 tablet, Refills: 0    gabapentin (NEURONTIN) 100 MG capsule Take 2 capsules (200 mg total) by mouth 2 (two) times daily. For Substance withdrawal syndrome Qty: 120 capsule, Refills: 0    hydrOXYzine (ATARAX/VISTARIL) 25 MG tablet Take 1 tablet (25 mg) three times daily as needed for anxiety Qty: 60 tablet, Refills: 0    Multiple Vitamin  (MULTIVITAMIN WITH MINERALS) TABS tablet Take 1 tablet by mouth daily. For low vitamin Qty: 30 tablet, Refills: 0    pantoprazole (PROTONIX) 40 MG tablet Take 1 tablet (40 mg total) by mouth daily. For acid reflux Qty: 30 tablet, Refills: 0       Allergies  Allergen Reactions  . Oxycodone Other (See Comments)    abd pain, stomach cramps       The results of significant diagnostics from this hospitalization (including imaging, microbiology, ancillary and laboratory) are listed below for reference.    Significant Diagnostic Studies: Dg Chest 2 View  11/08/2014   CLINICAL DATA:  Productive cough for 3 weeks. Midline chest pain. History of sarcoidosis.  EXAM: CHEST  2 VIEW  COMPARISON:  Chest CT 09/04/2014, chest x-ray 09/04/2014  FINDINGS: Heart size is normal. Mediastinal adenopathy is again identified. There is significant bilateral upper lobe parenchymal disease consistent with the history of sarcoidosis. There is slight increased density in both upper lobes. Superimposed infectious process cannot be entirely excluded. No pleural effusions.  IMPRESSION: 1. Chronic adenopathy and upper lobe fibrosis. 2. Question of superimposed infectious infiltrate in the upper lobes bilaterally.   Electronically Signed   By: Shon Hale M.D.   On: 11/08/2014 18:54    Microbiology: Recent Results (from the past 240 hour(s))  MRSA PCR Screening     Status: None   Collection Time: 11/09/14  7:39 AM  Result Value Ref Range Status   MRSA by PCR NEGATIVE NEGATIVE Final    Comment:        The GeneXpert MRSA Assay (FDA approved for NASAL specimens only), is one component of a comprehensive MRSA colonization surveillance program. It is not intended to diagnose MRSA infection nor to guide or monitor treatment for MRSA infections.      Labs: Basic Metabolic Panel:  Recent Labs Lab 11/08/14 1935 11/09/14 0550  NA 135* 139  K 3.8 3.9  CL 99 102  CO2 23 27  GLUCOSE 83 84  BUN 13 10   CREATININE 1.02 1.10  CALCIUM 9.3 9.0   Liver Function Tests: No results for input(s): AST, ALT, ALKPHOS, BILITOT, PROT, ALBUMIN in the last 168 hours. No results for input(s): LIPASE, AMYLASE in the last 168 hours. No results for input(s): AMMONIA in the last 168 hours. CBC:  Recent Labs Lab 11/08/14 1935 11/09/14 0550  WBC 6.7 4.9  HGB 12.5* 12.2*  HCT 38.4* 39.2  MCV 79.2 80.5  PLT 228 219   Cardiac Enzymes: No results for input(s): CKTOTAL, CKMB, CKMBINDEX, TROPONINI in the last 168 hours. BNP: BNP (last 3 results) No results for input(s): PROBNP in the last 8760 hours. CBG: No results for input(s): GLUCAP in the last 168 hours.     SignedNiel Hummer A  [facility] 11/10/2014, 7:54 AM

## 2014-11-10 NOTE — Progress Notes (Signed)
NURSING PROGRESS NOTE  Mcihael Hinderman [id number] Discharge Data: 11/10/2014 3:23 PM Attending Provider: No att. providers found PCP:KIM, Nickola Major., DO   [name] to be D/C'd Home per MD order.    All IV's will be discontinued and monitored for bleeding.  All belongings will be returned to patient for patient to take home.  Last Documented Vital Signs:  Blood pressure 135/85, pulse 64, temperature 98.1 F (36.7 C), temperature source Oral, resp. rate 12, height 5' 10.8" (1.798 m), weight 65.363 kg (144 lb 1.6 oz), SpO2 99 %.  Joslyn Hy, MSN, RN, [employer]

## 2014-11-10 NOTE — Progress Notes (Signed)
PHARMACIST - PHYSICIAN COMMUNICATION DR:   Tyrell Antonio CONCERNING: Antibiotic IV to Oral Route Change Policy  RECOMMENDATION: This patient is receiving  Azithromycin by the intravenous route.  Based on criteria approved by the Pharmacy and Therapeutics Committee, the antibiotic(s) is/are being converted to the equivalent oral dose form(s).   DESCRIPTION: These criteria include:  Patient being treated for a respiratory tract infection, urinary tract infection, cellulitis or clostridium difficile associated diarrhea if on metronidazole  The patient is not neutropenic and does not exhibit a GI malabsorption state  The patient is eating (either orally or via tube) and/or has been taking other orally administered medications for at least 24 hours  The patient is improving clinically and has a Tmax < 100.5  If you have questions about this conversion, please contact the Pharmacy Department  []   [phone] )  [facility] [x]   [phone] )  [facility]  []   [phone] )  [facility] []   [phone] )  [place], PharmD, BCPS

## 2014-11-11 ENCOUNTER — Ambulatory Visit: Payer: Self-pay | Attending: Family Medicine

## 2014-11-11 ENCOUNTER — Encounter: Payer: Self-pay | Admitting: Family Medicine

## 2014-11-11 ENCOUNTER — Ambulatory Visit (HOSPITAL_BASED_OUTPATIENT_CLINIC_OR_DEPARTMENT_OTHER): Payer: Self-pay | Admitting: Family Medicine

## 2014-11-11 VITALS — BP 129/89 | HR 93 | Temp 98.3°F | Resp 18 | Ht 69.0 in | Wt 146.0 lb

## 2014-11-11 DIAGNOSIS — L309 Dermatitis, unspecified: Secondary | ICD-10-CM | POA: Insufficient documentation

## 2014-11-11 DIAGNOSIS — F419 Anxiety disorder, unspecified: Secondary | ICD-10-CM | POA: Insufficient documentation

## 2014-11-11 DIAGNOSIS — D860 Sarcoidosis of lung: Secondary | ICD-10-CM

## 2014-11-11 DIAGNOSIS — L219 Seborrheic dermatitis, unspecified: Secondary | ICD-10-CM

## 2014-11-11 DIAGNOSIS — F1010 Alcohol abuse, uncomplicated: Secondary | ICD-10-CM

## 2014-11-11 DIAGNOSIS — R21 Rash and other nonspecific skin eruption: Secondary | ICD-10-CM | POA: Insufficient documentation

## 2014-11-11 DIAGNOSIS — K219 Gastro-esophageal reflux disease without esophagitis: Secondary | ICD-10-CM

## 2014-11-11 DIAGNOSIS — F10129 Alcohol abuse with intoxication, unspecified: Secondary | ICD-10-CM | POA: Insufficient documentation

## 2014-11-11 DIAGNOSIS — D869 Sarcoidosis, unspecified: Secondary | ICD-10-CM | POA: Insufficient documentation

## 2014-11-11 DIAGNOSIS — J189 Pneumonia, unspecified organism: Secondary | ICD-10-CM | POA: Insufficient documentation

## 2014-11-11 DIAGNOSIS — F1011 Alcohol abuse, in remission: Secondary | ICD-10-CM

## 2014-11-11 DIAGNOSIS — F411 Generalized anxiety disorder: Secondary | ICD-10-CM

## 2014-11-11 MED ORDER — GUAIFENESIN 200 MG PO TABS: 200.0000 mg | ORAL_TABLET | ORAL | Status: DC | PRN

## 2014-11-11 MED ORDER — ESCITALOPRAM OXALATE 10 MG PO TABS: ORAL_TABLET | ORAL | Status: DC

## 2014-11-11 MED ORDER — PANTOPRAZOLE SODIUM 40 MG PO TBEC: 40.0000 mg | DELAYED_RELEASE_TABLET | Freq: Every day | ORAL | Status: DC

## 2014-11-11 MED ORDER — KETOCONAZOLE 2 % EX CREA: 1.0000 "application " | TOPICAL_CREAM | Freq: Two times a day (BID) | CUTANEOUS | Status: DC

## 2014-11-11 NOTE — Assessment & Plan Note (Signed)
1. Pneumonia/sarcoidosis: Finish levaquin Albuterol as needed for cough or shortness of breath mucinex to help break up congestion F/u chest x-ray in 8-12 weeks  Referral to pulmonology placed.

## 2014-11-11 NOTE — Progress Notes (Signed)
Establish Care HFU Cough Dx Pneumonia  Stated not taking medication, needs refills will pick up antibiotic today Complaining of dry skin. Itching and discoloration

## 2014-11-11 NOTE — Patient Instructions (Signed)
Mr. Oehlert,  Thank you for coming in today. It was a pleasure meeting you. I look forward to being your primary doctor.   1. Pneumonia/sarcoidosis: Finish levaquin Albuterol as needed for cough or shortness of breath mucinex to help break up congestion F/u chest x-ray in 8-12 weeks  Referral to pulmonology placed.   2. Anxiety: Restart lexapro 10 mg daily for one week, then increase to 20 mg daily   3. Skin rash- I suspect seborrhea/fungal rash Use ketoconazole cream twice daily    Apply for [place] discount, orange card and pass.  F/u in 4-6 weeks for physical   Dr. Adrian Blackwater

## 2014-11-11 NOTE — Progress Notes (Signed)
   Subjective:    Patient ID: [name] [name], male    DOB: 02-Jul-1961, 53 y.o.   MRN: [id number] CC: HFU for PNA, skin rash,  HPI 53 yo M new patien twith history of sarcoidosis presents for HFU:  1. HFU for PNA and sarcoidosis: doing well. No fever. Some congestion. Cough has improved. Will pick of levaquin, albuterol and mucinex today. Has not seen a pulmonologist since being diagnosed with sarcoidosis. Chronic non smoker   2. Anxiety: has improved since being off ETOH for past 3 months. Still with some anxiety. Would like to restart lexapro.   3. Skin rash: around forehead. Slightly itchy. Has dry skin in general. X > 4 weeks. No treatment to date.   Soc Hx: non smoker, ETOH abuse in remission  Med Hx: h/o alcoholic hepatitis Fam Hx: DM in father  Review of Systems As per HPI      Objective:   Physical Exam BP 129/89 mmHg  Pulse 93  Temp(Src) 98.3 F (36.8 C) (Oral)  Resp 18  Ht 5\' 9"  (1.753 m)  Wt 146 lb (66.225 kg)  BMI 21.55 kg/m2  SpO2 99% General appearance: alert, cooperative and no distress Neck: no adenopathy, supple, symmetrical, trachea midline and thyroid not enlarged, symmetric, no tenderness/mass/nodules Lungs: clear to auscultation bilaterally Heart: regular rate and rhythm, S1, S2 normal, no murmur, click, rub or gallop Skin: hypopigmented macules on forehead along hair line, some skin scaling around nose.     Assessment & Plan:

## 2014-11-11 NOTE — Assessment & Plan Note (Addendum)
A: on face with xerosis P:  Use ketoconazole cream twice daily

## 2014-11-11 NOTE — Assessment & Plan Note (Signed)
A: well controlled with lexapro and alcohol cessation P: Refilled lexapro 10 mg for one week, then 20 mg daily

## 2014-11-12 LAB — BORDETELLA PERTUSSIS ANTIBODY
B pertussis IgA Ab, Quant: <1
B pertussis IgG Ab, Quant: 758
Bordetella pertussis IgA: 4
FHA IgG: >775

## 2014-11-15 LAB — CULTURE, BLOOD (ROUTINE X 2)
Culture: NO GROWTH
Culture: NO GROWTH

## 2014-11-23 ENCOUNTER — Other Ambulatory Visit: Payer: Self-pay | Admitting: *Deleted

## 2014-11-23 MED ORDER — ALBUTEROL SULFATE HFA 108 (90 BASE) MCG/ACT IN AERS: 2.0000 | INHALATION_SPRAY | Freq: Four times a day (QID) | RESPIRATORY_TRACT | Status: DC | PRN

## 2014-11-27 DIAGNOSIS — J189 Pneumonia, unspecified organism: Secondary | ICD-10-CM

## 2014-11-27 HISTORY — DX: Pneumonia, unspecified organism: J18.9

## 2014-12-07 ENCOUNTER — Telehealth: Payer: Self-pay | Admitting: Family Medicine

## 2014-12-07 NOTE — Telephone Encounter (Signed)
Pt was referred to a specialist but has not yet been called for an appt. Please follow up with pt.

## 2014-12-11 ENCOUNTER — Ambulatory Visit: Payer: Self-pay | Attending: Family Medicine

## 2014-12-11 ENCOUNTER — Telehealth: Payer: Self-pay | Admitting: Family Medicine

## 2014-12-11 DIAGNOSIS — J189 Pneumonia, unspecified organism: Secondary | ICD-10-CM

## 2014-12-11 NOTE — Telephone Encounter (Signed)
Pt. Came in inquiring to this chest  X-ray appointment.Marland KitchenMarland KitchenMarland KitchenOn his discharge paperwork it stated that he needed to follow up for chest xray appointment.Marland KitchenMarland KitchenMarland KitchenMarland Kitchenplease call patient in regards...Marland KitchenMarland Kitchen

## 2014-12-11 NOTE — Telephone Encounter (Signed)
Pt aware.

## 2014-12-11 NOTE — Addendum Note (Signed)
Addended by: Boykin Nearing on: 12/11/2014 01:02 PM   Modules accepted: Orders

## 2014-12-11 NOTE — Telephone Encounter (Signed)
Pt aware of order and dates

## 2014-12-11 NOTE — Telephone Encounter (Signed)
F/u CXR ordered and [facility] to [facility].  CXR to be done between 2/15-3/15/16.  Please inform patient.

## 2014-12-15 ENCOUNTER — Ambulatory Visit: Payer: MEDICAID | Attending: Family Medicine | Admitting: Family Medicine

## 2014-12-15 ENCOUNTER — Encounter: Payer: Self-pay | Admitting: Family Medicine

## 2014-12-15 VITALS — BP 137/91 | HR 80 | Temp 98.0°F | Resp 16 | Ht 69.0 in | Wt 155.0 lb

## 2014-12-15 DIAGNOSIS — F1010 Alcohol abuse, uncomplicated: Secondary | ICD-10-CM

## 2014-12-15 DIAGNOSIS — R102 Pelvic and perineal pain: Secondary | ICD-10-CM | POA: Insufficient documentation

## 2014-12-15 DIAGNOSIS — R1030 Lower abdominal pain, unspecified: Secondary | ICD-10-CM

## 2014-12-15 DIAGNOSIS — F1011 Alcohol abuse, in remission: Secondary | ICD-10-CM

## 2014-12-15 DIAGNOSIS — Z1211 Encounter for screening for malignant neoplasm of colon: Secondary | ICD-10-CM | POA: Insufficient documentation

## 2014-12-15 DIAGNOSIS — B351 Tinea unguium: Secondary | ICD-10-CM

## 2014-12-15 DIAGNOSIS — J189 Pneumonia, unspecified organism: Secondary | ICD-10-CM

## 2014-12-15 DIAGNOSIS — F411 Generalized anxiety disorder: Secondary | ICD-10-CM

## 2014-12-15 DIAGNOSIS — R05 Cough: Secondary | ICD-10-CM | POA: Insufficient documentation

## 2014-12-15 LAB — LIPID PANEL
Cholesterol: 239 mg/dL — ABNORMAL HIGH (ref 0–200)
HDL: 87 mg/dL (ref >39)
LDL Cholesterol: 128 mg/dL — ABNORMAL HIGH (ref 0–99)
Total CHOL/HDL Ratio: 2.7 Ratio
Triglycerides: 122 mg/dL (ref <150)
VLDL: 24 mg/dL (ref 0–40)

## 2014-12-15 LAB — POCT URINALYSIS DIPSTICK
Bilirubin, UA: NEGATIVE
Blood, UA: NEGATIVE
Glucose, UA: NEGATIVE
Ketones, UA: NEGATIVE
Leukocytes, UA: NEGATIVE
Nitrite, UA: NEGATIVE
Protein, UA: NEGATIVE
Spec Grav, UA: 1.02
Urobilinogen, UA: 0.2
pH, UA: 7

## 2014-12-15 MED ORDER — ESCITALOPRAM OXALATE 20 MG PO TABS: 20.0000 mg | ORAL_TABLET | Freq: Every day | ORAL | Status: DC

## 2014-12-15 MED ORDER — BENZONATATE 100 MG PO CAPS: 100.0000 mg | ORAL_CAPSULE | Freq: Two times a day (BID) | ORAL | Status: DC | PRN

## 2014-12-15 NOTE — Assessment & Plan Note (Signed)
Referral to GI placed

## 2014-12-15 NOTE — Progress Notes (Signed)
Patient here to follow up on PNA-was hospitalized in 11/2014 Patient would like physical Patient has had no colonoscopy Flu and PNA shot given in hospital Patient says you would like to set up chest xray to check on sarcoidosis

## 2014-12-15 NOTE — Progress Notes (Signed)
   Subjective:    Patient ID: [name] [name], male    DOB: [birth year], 54 y.o.   MRN: [id number] CC: physical  HPI Acute complaints/concerns: none  1. Recent PNA: still with cough. No fever, CP or SOB. Plan for f/u CXR 2/13-3/13.   Review of Systems  General:  Negative foru nexplained weight loss, fever Skin: Negative for new or changing mole, sore that won't heal HEENT: Negative for trouble hearing, trouble seeing, ringing in ears, mouth sores, hoarseness, change in voice, dysphagia. CV:  Negative for chest pain, dyspnea, edema, palpitations Resp: Admits to cough. Negative for, dyspnea, hemoptysis GI: Negative for nausea, vomiting, diarrhea, constipation, abdominal pain, melena, hematochezia. GU: admits to suprapubic discomfort with urination.  Negative for dysuria, incontinence, urinary hesitance, hematuria, vaginal or penile discharge, polyuria, sexual difficulty, lumps in testicle or breasts MSK: Negative for muscle cramps or aches, joint pain or swelling Neuro: Negative for headaches, weakness, numbness, dizziness, passing out/fainting Psych: Negative for depression, anxiety, memory problems    Objective:   Physical Exam BP 137/91 mmHg  Pulse 80  Temp(Src) 98 F (36.7 C)  Resp 16  Ht 5\' 9"  (1.753 m)  Wt 155 lb (70.308 kg)  BMI 22.88 kg/m2  SpO2 100%  BP Readings from Last 3 Encounters:  12/15/14 137/91  11/11/14 129/89  11/10/14 135/85     General Appearance:    Alert, cooperative, no distress, appears stated age  Head:    Normocephalic, without obvious abnormality, atraumatic  Eyes:    PERRL, conjunctiva/corneas clear, EOM's intact both eyes       Ears:    Normal TM's and external ear canals, both ears  Nose:   Nares normal, septum midline, mucosa normal, no drainage   or sinus tenderness  Throat:   Lips, mucosa, and tongue normal; teeth and gums normal  Neck:   Supple, symmetrical, trachea midline, no adenopathy;       thyroid:  No enlargement/tenderness/nodules;     Back:     Symmetric, no curvature, ROM normal, no CVA tenderness  Lungs:     Clear to auscultation bilaterally, respirations unlabored  Chest wall:    No tenderness or deformity  Heart:    Regular rate and rhythm, S1 and S2 normal, no murmur, rub   or gallop  Abdomen:     Soft, non-tender, bowel sounds active all four quadrants,    no masses, no organomegaly  Genitalia:    Normal male without lesion, discharge or tenderness  Rectal:    Normal tone, normal prostate, no masses or tenderness;   guaiac negative stool  Extremities:   Extremities normal, atraumatic, no cyanosis or edema  Pulses:   2+ and symmetric all extremities  Skin:   Skin color, texture, turgor normal, no rashes or lesions. Darkened and  Thickened toenails.   Lymph nodes:   Cervical, supraclavicular, and axillary nodes normal  Neurologic:   CNII-XII intact. Normal strength, sensation and reflexes      throughout         Assessment & Plan:

## 2014-12-15 NOTE — Assessment & Plan Note (Signed)
A: symptomatically normal with persistent dry cough P: Tessalon perles F/u CXR between 2/13-3/13

## 2014-12-15 NOTE — Patient Instructions (Signed)
[name] [name],  Thank you for coming in today.  1. Due for cough: tessalon perles  2. F/u CXR 2/13-3/13, order is in.  3. Screening colonscopy, referral to GI 4. Lipids  No new medications.   F/u in 2 months for sarcoidosis.   Dr. Adrian Blackwater

## 2014-12-22 ENCOUNTER — Telehealth: Payer: Self-pay | Admitting: *Deleted

## 2014-12-22 NOTE — Telephone Encounter (Signed)
-----   Message from Minerva Ends, MD sent at 12/17/2014  1:17 PM EST ----- Slightly elevated chol. No medication needed. Low fat diet recommended

## 2014-12-22 NOTE — Telephone Encounter (Signed)
Pt aware of lab resuts

## 2015-01-07 ENCOUNTER — Encounter: Payer: Self-pay | Admitting: Internal Medicine

## 2015-01-15 ENCOUNTER — Encounter: Payer: Self-pay | Admitting: Family Medicine

## 2015-01-15 ENCOUNTER — Ambulatory Visit: Payer: Self-pay | Attending: Family Medicine | Admitting: Family Medicine

## 2015-01-15 VITALS — BP 140/87 | HR 66 | Temp 97.8°F | Resp 16 | Ht 69.0 in | Wt 153.0 lb

## 2015-01-15 DIAGNOSIS — D869 Sarcoidosis, unspecified: Secondary | ICD-10-CM | POA: Insufficient documentation

## 2015-01-15 DIAGNOSIS — R7401 Elevation of levels of liver transaminase levels: Secondary | ICD-10-CM

## 2015-01-15 DIAGNOSIS — F1011 Alcohol abuse, in remission: Secondary | ICD-10-CM

## 2015-01-15 DIAGNOSIS — R05 Cough: Secondary | ICD-10-CM | POA: Insufficient documentation

## 2015-01-15 DIAGNOSIS — Z131 Encounter for screening for diabetes mellitus: Secondary | ICD-10-CM

## 2015-01-15 DIAGNOSIS — K7010 Alcoholic hepatitis without ascites: Secondary | ICD-10-CM

## 2015-01-15 DIAGNOSIS — J189 Pneumonia, unspecified organism: Secondary | ICD-10-CM | POA: Insufficient documentation

## 2015-01-15 DIAGNOSIS — R740 Nonspecific elevation of levels of transaminase and lactic acid dehydrogenase [LDH]: Secondary | ICD-10-CM

## 2015-01-15 DIAGNOSIS — B351 Tinea unguium: Secondary | ICD-10-CM | POA: Insufficient documentation

## 2015-01-15 DIAGNOSIS — F1010 Alcohol abuse, uncomplicated: Secondary | ICD-10-CM

## 2015-01-15 DIAGNOSIS — R053 Chronic cough: Secondary | ICD-10-CM | POA: Insufficient documentation

## 2015-01-15 LAB — COMPLETE METABOLIC PANEL WITH GFR
ALT: 13 U/L (ref 0–53)
AST: 20 U/L (ref 0–37)
Albumin: 4.5 g/dL (ref 3.5–5.2)
Alkaline Phosphatase: 57 U/L (ref 39–117)
BUN: 13 mg/dL (ref 6–23)
CO2: 30 mEq/L (ref 19–32)
Calcium: 9.7 mg/dL (ref 8.4–10.5)
Chloride: 98 mEq/L (ref 96–112)
Creat: 1.18 mg/dL (ref 0.50–1.35)
GFR, Est African American: 81 mL/min
GFR, Est Non African American: 70 mL/min
Glucose, Bld: 77 mg/dL (ref 70–99)
Potassium: 4.3 mEq/L (ref 3.5–5.3)
Sodium: 136 mEq/L (ref 135–145)
Total Bilirubin: 0.4 mg/dL (ref 0.2–1.2)
Total Protein: 8.2 g/dL (ref 6.0–8.3)

## 2015-01-15 LAB — POCT GLYCOSYLATED HEMOGLOBIN (HGB A1C): Hemoglobin A1C: 5.9

## 2015-01-15 LAB — GLUCOSE, POCT (MANUAL RESULT ENTRY): POC Glucose: 85 mg/dl (ref 70–99)

## 2015-01-15 MED ORDER — BENZONATATE 100 MG PO CAPS: 100.0000 mg | ORAL_CAPSULE | Freq: Two times a day (BID) | ORAL | Status: DC | PRN

## 2015-01-15 MED ORDER — CETIRIZINE HCL 10 MG PO TABS: 10.0000 mg | ORAL_TABLET | Freq: Every day | ORAL | Status: DC

## 2015-01-15 NOTE — Progress Notes (Signed)
Patient has CAPNA in 10/2014 he feels cough is no better Cough is described as dry in nature with no reliefe from Tessalon or mucinex Patient is concerned he is diabetic he says sometimes he feels like his sugar is dropping and that he never feels full when he eats

## 2015-01-15 NOTE — Assessment & Plan Note (Addendum)
Toenail fungus: repeat labs (liver test)  If normal or stable (not rising) I will prescribe lamisil.   Normal LFTs Lamisil sent in

## 2015-01-15 NOTE — Assessment & Plan Note (Signed)
A: chronic cough in patient with sarcoidosis, recent pneumonia in 10/2014, treated. No improvement with PPI,  tessalon perles, mucinex, albuterol. Suspect post-infective cough vs post nasal drip. Normal exam. Well appearing.  P: F/u CXR post PNA Zyrtec daily

## 2015-01-15 NOTE — Patient Instructions (Signed)
[name] [name],  Thank you for coming in today.  1. Cough:  Normal exam today.  Start zyrtec- anthistamine once daily  Continue protonix  Get a follow up chest x-ray.    2. Toenail fungus: repeat labs (liver test)  If normal or stable (not rising) I will prescribe lamisil.   You will be called with lab and x-ray results.   F/u in 6 weeks for cough  Dr. Adrian Blackwater

## 2015-01-15 NOTE — Assessment & Plan Note (Signed)
Patient will have f/u CXR

## 2015-01-15 NOTE — Progress Notes (Signed)
   Subjective:    Patient ID: [name] [name], male    DOB: 01/03/1961, 54 y.o.   MRN: [id number] CC: chronic cough, sarcoidosis  HPI 54 yo M with sarcoidosis, pneumonia in 12/15, hx of ETOH abuse sober x 5 months  1. Cough: persistent daily cough when talking and laughing. No fever ,chills, CP, SOB. Cough is non productive. Unchanged with PPI, tessalon perles, albuterol. Patient with recent URI.   2. PNA: in 10/2014. ROS as per above. Due for f/u CXR, order in place.  3. Toenail fungus: elevated liver transaminases, so no lamisil prescribed. Patient interested in treatment. Patient no longer drinking x 5 months.   Soc Hx: non smoker  Review of Systems As per HPI     Objective:   Physical Exam BP 140/87 mmHg  Pulse 66  Temp(Src) 97.8 F (36.6 C)  Resp 16  Ht 5\' 9"  (1.753 m)  Wt 153 lb (69.4 kg)  BMI 22.58 kg/m2  SpO2 99% General appearance: alert, cooperative and no distress Nose: clear discharge, turbinates pink, swollen Throat: lips, mucosa, and tongue normal; teeth and gums normal Neck: no adenopathy, supple, symmetrical, trachea midline and thyroid not enlarged, symmetric, no tenderness/mass/nodules Lungs: clear to auscultation bilaterally Heart: regular rate and rhythm, S1, S2 normal, no murmur, click, rub or gallop  Lab Results  Component Value Date   HGBA1C 5.90 01/15/2015  CBG 85       Assessment & Plan:

## 2015-01-17 ENCOUNTER — Encounter: Payer: Self-pay | Admitting: Family Medicine

## 2015-01-17 MED ORDER — TERBINAFINE HCL 250 MG PO TABS: 250.0000 mg | ORAL_TABLET | Freq: Every day | ORAL | Status: DC

## 2015-01-17 NOTE — Addendum Note (Signed)
Addended by: Boykin Nearing on: 01/17/2015 08:39 AM   Modules accepted: Orders

## 2015-01-17 NOTE — Assessment & Plan Note (Signed)
Resolved

## 2015-01-17 NOTE — Assessment & Plan Note (Signed)
Normal LFTs.  Patient sober during [place].  Patient remains in remission

## 2015-01-19 ENCOUNTER — Telehealth: Payer: Self-pay | Admitting: *Deleted

## 2015-01-19 NOTE — Telephone Encounter (Signed)
-----   Message from Minerva Ends, MD sent at 01/17/2015  8:29 AM EST ----- Normal liver enzymes Sent in lamisil for toenails, 12 weeks course with plan for recheck CMP on 6 weeks

## 2015-01-19 NOTE — Telephone Encounter (Signed)
-----   Message from Minerva Ends, MD sent at 01/17/2015  8:40 AM EST ----- LFTs normal limisil sent in for toenail fungus

## 2015-01-19 NOTE — Telephone Encounter (Signed)
Left message to return call 

## 2015-01-20 ENCOUNTER — Telehealth: Payer: Self-pay | Admitting: Family Medicine

## 2015-01-20 NOTE — Telephone Encounter (Signed)
Patient called to speak to nurse about results. Please f/u with pt.

## 2015-01-20 NOTE — Telephone Encounter (Signed)
Pt aware  Will pick up Rx tomorrow  

## 2015-01-22 ENCOUNTER — Ambulatory Visit
Admission: RE | Admit: 2015-01-22 | Discharge: 2015-01-22 | Disposition: A | Discharge status: Home / Self Care | Payer: No Typology Code available for payment source | Source: Ambulatory Visit | Attending: Family Medicine | Admitting: Family Medicine

## 2015-01-22 ENCOUNTER — Telehealth: Payer: Self-pay | Admitting: *Deleted

## 2015-01-22 DIAGNOSIS — J189 Pneumonia, unspecified organism: Secondary | ICD-10-CM

## 2015-01-22 NOTE — Telephone Encounter (Signed)
-----   Message from Minerva Ends, MD sent at 01/22/2015 10:54 AM EST ----- CXR concerning for recurrent or leftover pna, but last physical exam was not at all concerning. Plan: No antibiotics w/o productive cough, fever, chills, CP, SOB. Keep f/u with pulmonology

## 2015-01-22 NOTE — Telephone Encounter (Signed)
Pt aware of of X-ray appointment

## 2015-01-27 ENCOUNTER — Ambulatory Visit (INDEPENDENT_AMBULATORY_CARE_PROVIDER_SITE_OTHER): Payer: No Typology Code available for payment source | Admitting: Pulmonary Disease

## 2015-01-27 ENCOUNTER — Encounter: Payer: Self-pay | Admitting: Pulmonary Disease

## 2015-01-27 VITALS — BP 116/74 | HR 80 | Ht 69.0 in | Wt 154.0 lb

## 2015-01-27 DIAGNOSIS — J479 Bronchiectasis, uncomplicated: Secondary | ICD-10-CM

## 2015-01-27 DIAGNOSIS — R05 Cough: Secondary | ICD-10-CM

## 2015-01-27 DIAGNOSIS — R053 Chronic cough: Secondary | ICD-10-CM

## 2015-01-27 DIAGNOSIS — D860 Sarcoidosis of lung: Secondary | ICD-10-CM

## 2015-01-27 NOTE — Patient Instructions (Signed)
Continue taking your zyrtec 10mg  each day EVERYDAY, but would also like you to get chlorpheniramine 4mg  and take 2 each night at bedtime Try voice rest.  No singing or yelling until next visit.  No prolonged conversations. Use hard candy, no mint or cough drops.  Keep in mouth all day to bathe the back of your throat to prevent the cough You cannot smoke even one cigarette! Will schedule for breathing studies, and see you back on same day to review.

## 2015-01-27 NOTE — Progress Notes (Signed)
Subjective:    Patient ID: [name] [name], male    DOB: 09-09-1961, 54 y.o.   MRN: [id number]  HPI The patient is a 54 year old male who I've been asked to see for management of pulmonary sarcoidosis. He tells me that he was diagnosed in [place] over 15 years ago, and describes fiberoptic bronchoscopy with biopsy. He was apparently treated with prednisone for about 6 months, and then weaned off the medication. He is unsure if this made a difference in symptoms or not. He has really not been on prednisone consistently over the years, but has noticed worsening dyspnea on exertion.  It has now gotten to the point where it is impacting his daily life. He describes a 3 block dyspnea on exertion at a moderate pace on flat ground. He can also get winded walking up a flight of stairs, and at times bringing groceries in from the car. He feels a tightness in his chest that is always there, but has tried inhalers that have not made a big difference. In October of last year he started with a progressive cough that was primarily dry, and this has persisted. It is worse with laughing, singing, and has never really produced any significant quantities of mucus. He feels there is a tickle in his throat and upper chest that causes the cough. He has had a chest x-ray that shows the classic changes of stage IV sarcoidosis, and has also shown a little more density in the left upper lobe of unknown significance. He does not have any history that is suggestive of pneumonia. He has had a CT chest in October of last year that showed significant mediastinal and hilar lymphadenopathy with calcification. He also has some mild interstitial disease more pronounced in the upper over the lower lobes, with classic fibrotic changes in the upper lobe consistent with stage IV sarcoid. There is upper lobe bronchiectasis as well. The patient has not had PFTs since his initial diagnosis. Unfortunately, he continues to smoke at times, but not on a  regular basis.   Review of Systems  Constitutional: Negative for fever and unexpected weight change.  HENT: Positive for congestion. Negative for dental problem, ear pain, nosebleeds, postnasal drip, rhinorrhea, sinus pressure, sneezing, sore throat and trouble swallowing.   Eyes: Negative for redness and itching.  Respiratory: Positive for cough and shortness of breath. Negative for chest tightness and wheezing.   Cardiovascular: Negative for palpitations and leg swelling.  Gastrointestinal: Negative for nausea and vomiting.  Genitourinary: Negative for dysuria.  Musculoskeletal: Positive for joint swelling.  Skin: Negative for rash.  Neurological: Negative for headaches.  Hematological: Does not bruise/bleed easily.  Psychiatric/Behavioral: Negative for dysphoric mood. The patient is nervous/anxious.        Objective:   Physical Exam Constitutional:  Thin male, no acute distress  HENT:  Nares patent but loaded with secretions that are nonpurulent  Oropharynx without exudate, palate and uvula are normal  Eyes:  Perrla, eomi, no scleral icterus  Neck:  No JVD, no TMG  Cardiovascular:  Normal rate, regular rhythm, no rubs or gallops.  No murmurs        Intact distal pulses  Pulmonary :  Normal breath sounds, no stridor or respiratory distress   No rales, rhonchi, or wheezing  Abdominal:  Soft, nondistended, bowel sounds present.  No tenderness noted.   Musculoskeletal:  No lower extremity edema noted.  Lymph Nodes:  No cervical lymphadenopathy noted  Skin:  No cyanosis noted  Neurologic:  Alert,  appropriate, moves all 4 extremities without obvious deficit.         Assessment & Plan:

## 2015-01-27 NOTE — Assessment & Plan Note (Signed)
The patient tells me that he was diagnosed with sarcoidosis over 15 years ago by bronchoscopy while living in [place]. He has been treated with prednisone for a short period of time, but has not needed chronic steroids over the years. His current presentation is most consistent with stage IV sarcoid, which usually does not require specific treatment. He is having worsening dyspnea on exertion, and I wonder if he has developed airway symptoms associated with his advanced disease. I would like to do full pulmonary function studies for evaluation, and see him back to review.

## 2015-01-27 NOTE — Assessment & Plan Note (Signed)
The patient is describing a dry and hacking cough that sounds more upper airway in origin than lower. It is worsened with prolonged conversation, laughing, and if he tries to sing. Upper airway cough syrup typically caused by postnasal drip, reflux, and also a cyclical cough mechanism after an episode of infection. He certainly has a lot of pulmonary pathology that could cause cough, but based on the timing and the character, I suspect this is coming from his upper airway. He has a lot of secretions in his nasopharynx and nasal passageways, and I would like to intensify his antihistamine treatment. He also has a lot of reflux symptoms, and this may have to be treated more aggressively as well. Finally, I have reviewed the behavioral therapies for the upper airway cough syndrome.

## 2015-01-27 NOTE — Assessment & Plan Note (Signed)
The patient has significant bronchiectasis and lung distraction in his upper lobes that are commonly seen with stage IV sarcoidosis. He does have some increased density in the left upper lobe on his more recent chest x-rays, but is really not having any symptoms that would be suggestive of an ongoing acute infection. This may simply be mucus plugging and inflammatory changes.

## 2015-02-04 ENCOUNTER — Ambulatory Visit (AMBULATORY_SURGERY_CENTER): Payer: Self-pay | Admitting: *Deleted

## 2015-02-04 VITALS — Ht 69.0 in | Wt 153.4 lb

## 2015-02-04 DIAGNOSIS — Z1211 Encounter for screening for malignant neoplasm of colon: Secondary | ICD-10-CM

## 2015-02-04 NOTE — Progress Notes (Signed)
No egg or soy allergy No issues with past sedation No diet pill no home 02 use No e mail access

## 2015-02-09 ENCOUNTER — Institutional Professional Consult (permissible substitution): Payer: Self-pay | Admitting: Pulmonary Disease

## 2015-02-11 ENCOUNTER — Ambulatory Visit (INDEPENDENT_AMBULATORY_CARE_PROVIDER_SITE_OTHER): Payer: Self-pay | Admitting: Pulmonary Disease

## 2015-02-11 ENCOUNTER — Encounter: Payer: Self-pay | Admitting: Pulmonary Disease

## 2015-02-11 ENCOUNTER — Ambulatory Visit (HOSPITAL_COMMUNITY)
Admission: RE | Admit: 2015-02-11 | Discharge: 2015-02-11 | Disposition: A | Discharge status: Home / Self Care | Payer: No Typology Code available for payment source | Source: Ambulatory Visit | Attending: Pulmonary Disease | Admitting: Pulmonary Disease

## 2015-02-11 VITALS — BP 132/70 | HR 74 | Temp 97.0°F | Ht 69.0 in | Wt 149.0 lb

## 2015-02-11 DIAGNOSIS — D869 Sarcoidosis, unspecified: Secondary | ICD-10-CM | POA: Insufficient documentation

## 2015-02-11 DIAGNOSIS — D860 Sarcoidosis of lung: Secondary | ICD-10-CM

## 2015-02-11 DIAGNOSIS — J479 Bronchiectasis, uncomplicated: Secondary | ICD-10-CM

## 2015-02-11 LAB — PULMONARY FUNCTION TEST
DL/VA % pred: 86 %
DL/VA: 3.97 ml/min/mmHg/L
DLCO unc % pred: 63 %
DLCO unc: 19.55 ml/min/mmHg
FEF 25-75 Post: 2.51 L/sec
FEF 25-75 Pre: 2.19 L/sec
FEF2575-%Change-Post: 14 %
FEF2575-%Pred-Post: 80 %
FEF2575-%Pred-Pre: 70 %
FEV1-%Change-Post: 3 %
FEV1-%Pred-Post: 91 %
FEV1-%Pred-Pre: 88 %
FEV1-Post: 2.9 L
FEV1-Pre: 2.81 L
FEV1FVC-%Change-Post: 8 %
FEV1FVC-%Pred-Pre: 94 %
FEV6-%Change-Post: -4 %
FEV6-%Pred-Post: 90 %
FEV6-%Pred-Pre: 94 %
FEV6-Post: 3.51 L
FEV6-Pre: 3.66 L
FEV6FVC-%Change-Post: 1 %
FEV6FVC-%Pred-Post: 103 %
FEV6FVC-%Pred-Pre: 101 %
FVC-%Change-Post: -4 %
FVC-%Pred-Post: 88 %
FVC-%Pred-Pre: 92 %
FVC-Post: 3.52 L
FVC-Pre: 3.71 L
Post FEV1/FVC ratio: 82 %
Post FEV6/FVC ratio: 100 %
Pre FEV1/FVC ratio: 76 %
Pre FEV6/FVC Ratio: 99 %
RV % pred: 62 %
RV: 1.28 L
TLC % pred: 71 %
TLC: 4.84 L

## 2015-02-11 MED ORDER — ALBUTEROL SULFATE (2.5 MG/3ML) 0.083% IN NEBU
2.5000 mg | INHALATION_SOLUTION | Freq: Once | RESPIRATORY_TRACT | Status: AC
  Administered 2015-02-11: 2.5 mg via RESPIRATORY_TRACT

## 2015-02-11 MED ORDER — PREDNISONE 10 MG PO TABS: ORAL_TABLET | ORAL | Status: DC

## 2015-02-11 NOTE — Assessment & Plan Note (Signed)
The patient carries the diagnosis of pulmonary sarcoidosis, and based on his x-ray appearance this is most consistent with stage IV disease. Surprisingly, his PFTs show only mild abnormalities in his total lung capacity and diffusion capacity. He is having dyspnea on exertion and cough that is chronic, but it is unclear whether treatment with a course of prednisone would help him. Typically stage IV sarcoidosis does not respond to chronic steroids, although there is a subset who do. This is only the second time that I have seen the patient, and it is unclear if he would have a positive response or not. Therefore, I would like to treat him with a course of prednisone over the next 4 weeks and reevaluate his symptoms and chest x-ray.

## 2015-02-11 NOTE — Progress Notes (Signed)
   Subjective:    Patient ID: [name] [name], male    DOB: 13-Oct-1961, 54 y.o.   MRN: [id number]  HPI The patient comes in today for follow-up of his recent PFTs. He carries the diagnosis of sarcoidosis, and by his radiographic appearance this is most consistent with stage IV disease. He has had worsening dyspnea on exertion as well as a dry cough that was initially felt to be upper airway in origin. He was tried on antihistamines for postnasal drip, and also asked to try behavioral therapies for cyclical coughing. I had also asked him to quit smoking. He comes in today where he tells me that he is no longer smoking, and has stuck with the recommendations from the last visit. He really has not seen a difference in his cough. His PFTs today showed no airflow obstruction, mild restriction, and a mild reduction in his diffusion capacity.   Review of Systems  Constitutional: Negative for fever, chills, activity change, appetite change and unexpected weight change.  HENT: Negative for congestion, dental problem, ear pain, nosebleeds, postnasal drip, rhinorrhea, sinus pressure, sneezing, sore throat, trouble swallowing and voice change.   Eyes: Negative for redness, itching and visual disturbance.  Respiratory: Positive for cough, chest tightness and shortness of breath. Negative for choking and wheezing.   Cardiovascular: Negative for chest pain, palpitations and leg swelling.  Gastrointestinal: Negative for nausea, vomiting and abdominal pain.  Genitourinary: Negative for dysuria and difficulty urinating.  Musculoskeletal: Negative for joint swelling and arthralgias.  Skin: Negative for rash.  Neurological: Negative for headaches.  Hematological: Does not bruise/bleed easily.  Psychiatric/Behavioral: Negative for behavioral problems, confusion and dysphoric mood. The patient is not nervous/anxious.        Objective:   Physical Exam Thin male in no acute distress Nose without purulence or discharge  noted Neck without lymphadenopathy or thyromegaly Chest with a few crackles, but otherwise clear Cardiac exam with regular rate and rhythm Lower extremities without edema, no cyanosis Alert and oriented, moves all 4 extremities.       Assessment & Plan:

## 2015-02-11 NOTE — Patient Instructions (Signed)
Continue to stay off cigarettes. Stay on your allergy med if you think it is helping postnasal drip Will start you on prednisone at 40mg  a day for 2 weeks, then 30mg  a day until I see you back followup with me again in 4 weeks, with chest xray on the same day.

## 2015-02-12 ENCOUNTER — Encounter: Payer: Self-pay | Admitting: Internal Medicine

## 2015-02-12 ENCOUNTER — Ambulatory Visit (AMBULATORY_SURGERY_CENTER): Payer: Self-pay | Admitting: Internal Medicine

## 2015-02-12 VITALS — BP 127/79 | HR 64 | Temp 96.0°F | Resp 16 | Ht 69.0 in | Wt 153.0 lb

## 2015-02-12 DIAGNOSIS — Z1211 Encounter for screening for malignant neoplasm of colon: Secondary | ICD-10-CM

## 2015-02-12 MED ORDER — SODIUM CHLORIDE 0.9 % IV SOLN: 500.0000 mL | INTRAVENOUS | Status: DC

## 2015-02-12 NOTE — Patient Instructions (Addendum)
Your colonoscopy was normal.   Next routine colonoscopy in 10 years - 2026  I appreciate the opportunity to care for you. Gatha Mayer, MD, FACG  YOU HAD AN ENDOSCOPIC PROCEDURE TODAY AT [facility]:   Refer to the procedure report that was given to you for any specific questions about what was found during the examination.  If the procedure report does not answer your questions, please call your gastroenterologist to clarify.  If you requested that your care partner not be given the details of your procedure findings, then the procedure report has been included in a sealed envelope for you to review at your convenience later.  YOU SHOULD EXPECT: Some feelings of bloating in the abdomen. Passage of more gas than usual.  Walking can help get rid of the air that was put into your GI tract during the procedure and reduce the bloating. If you had a lower endoscopy (such as a colonoscopy or flexible sigmoidoscopy) you may notice spotting of blood in your stool or on the toilet paper. If you underwent a bowel prep for your procedure, you may not have a normal bowel movement for a few days.  Please Note:  You might notice some irritation and congestion in your nose or some drainage.  This is from the oxygen used during your procedure.  There is no need for concern and it should clear up in a day or so.  SYMPTOMS TO REPORT IMMEDIATELY:   Following lower endoscopy (colonoscopy or flexible sigmoidoscopy):  Excessive amounts of blood in the stool  Significant tenderness or worsening of abdominal pains  Swelling of the abdomen that is new, acute  Fever of 100F or higher    For urgent or emergent issues, a gastroenterologist can be reached at any hour by calling [phone].   DIET: Your first meal following the procedure should be a small meal and then it is ok to progress to your normal diet. Heavy or fried foods are harder to digest and may make you feel nauseous or  bloated.  Likewise, meals heavy in dairy and vegetables can increase bloating.  Drink plenty of fluids but you should avoid alcoholic beverages for 24 hours.  ACTIVITY:  You should plan to take it easy for the rest of today and you should NOT DRIVE or use heavy machinery until tomorrow (because of the sedation medicines used during the test).    FOLLOW UP: Our staff will call the number listed on your records the next business day following your procedure to check on you and address any questions or concerns that you may have regarding the information given to you following your procedure. If we do not reach you, we will leave a message.  However, if you are feeling well and you are not experiencing any problems, there is no need to return our call.  We will assume that you have returned to your regular daily activities without incident.  If any biopsies were taken you will be contacted by phone or by letter within the next 1-3 weeks.  Please call us at [phone] if you have not heard about the biopsies in 3 weeks.    SIGNATURES/CONFIDENTIALITY: You and/or your care partner have signed paperwork which will be entered into your electronic medical record.  These signatures attest to the fact that that the information above on your After Visit Summary has been reviewed and is understood.  Full responsibility of the confidentiality of this discharge information  lies with you and/or your care-partner.

## 2015-02-12 NOTE — Op Note (Signed)
[place]  [address] [place], [address]   COLONOSCOPY PROCEDURE REPORT  PATIENT: [name] [name], [name] [name]  MR#: [id number] BIRTHDATE: 12-Sep-1961 , 28  yrs. old GENDER: male ENDOSCOPIST: Gatha Mayer, MD, [facility] PROCEDURE DATE:  02/12/2015 PROCEDURE:   Colonoscopy, screening First Screening Colonoscopy - Avg.  risk and is 50 yrs.  old or older Yes.  Prior Negative Screening - Now for repeat screening. N/A  History of Adenoma - Now for follow-up colonoscopy & has been > or = to 3 yrs.  N/A ASA CLASS:   Class II INDICATIONS:Screening for colonic neoplasia and Colorectal Neoplasm Risk Assessment for this procedure is average risk. MEDICATIONS: Propofol 200 mg IV and Monitored anesthesia care  DESCRIPTION OF PROCEDURE:   After the risks benefits and alternatives of the procedure were thoroughly explained, informed consent was obtained.  The digital rectal exam revealed no abnormalities of the rectum.   The LB [id number]  endoscope was introduced through the anus and advanced to the cecum, which was identified by both the appendix and ileocecal valve. No adverse events experienced.   The quality of the prep was excellent. (MiraLax was used)  The instrument was then slowly withdrawn as the colon was fully examined.      COLON FINDINGS: A normal appearing cecum, ileocecal valve, and appendiceal orifice were identified.  The ascending, transverse, descending, sigmoid colon, and rectum appeared unremarkable. Retroflexed views revealed no abnormalities. The time to cecum = 2.6 Withdrawal time = 9.9   The scope was withdrawn and the procedure completed. COMPLICATIONS: There were no immediate complications.  ENDOSCOPIC IMPRESSION: Normal colonoscopy  RECOMMENDATIONS: Repeat colonoscopy 10 years.  eSigned:  Gatha Mayer, MD, [facility] 02/12/2015 9:06 AM   cc: The Patient and Orvan Falconer, MD

## 2015-02-12 NOTE — Progress Notes (Signed)
To recovery, report to [place], Therapist, sports. VSS

## 2015-02-15 ENCOUNTER — Telehealth: Payer: Self-pay

## 2015-02-15 NOTE — Telephone Encounter (Signed)
Left message on answering machine. 

## 2015-02-22 ENCOUNTER — Encounter: Payer: Self-pay | Admitting: Family Medicine

## 2015-02-22 ENCOUNTER — Ambulatory Visit: Payer: Self-pay | Attending: Family Medicine | Admitting: Family Medicine

## 2015-02-22 VITALS — BP 125/82 | HR 70 | Temp 98.1°F | Resp 18 | Ht 69.0 in | Wt 159.0 lb

## 2015-02-22 DIAGNOSIS — R05 Cough: Secondary | ICD-10-CM

## 2015-02-22 DIAGNOSIS — H6123 Impacted cerumen, bilateral: Secondary | ICD-10-CM

## 2015-02-22 DIAGNOSIS — F411 Generalized anxiety disorder: Secondary | ICD-10-CM

## 2015-02-22 DIAGNOSIS — R053 Chronic cough: Secondary | ICD-10-CM

## 2015-02-22 DIAGNOSIS — K219 Gastro-esophageal reflux disease without esophagitis: Secondary | ICD-10-CM

## 2015-02-22 DIAGNOSIS — L720 Epidermal cyst: Secondary | ICD-10-CM

## 2015-02-22 DIAGNOSIS — B351 Tinea unguium: Secondary | ICD-10-CM

## 2015-02-22 MED ORDER — ESCITALOPRAM OXALATE 20 MG PO TABS
20.0000 mg | ORAL_TABLET | Freq: Every day | ORAL | Status: DC
Start: 2015-02-22 — End: 2015-07-07

## 2015-02-22 MED ORDER — CETIRIZINE HCL 10 MG PO TABS: 10.0000 mg | ORAL_TABLET | Freq: Every day | ORAL | Status: DC

## 2015-02-22 MED ORDER — CARBAMIDE PEROXIDE 6.5 % OT SOLN: 5.0000 [drp] | Freq: Two times a day (BID) | OTIC | Status: DC

## 2015-02-22 MED ORDER — PANTOPRAZOLE SODIUM 40 MG PO TBEC: 40.0000 mg | DELAYED_RELEASE_TABLET | Freq: Every day | ORAL | Status: DC

## 2015-02-22 NOTE — Patient Instructions (Signed)
[name] [name],  Thank you for coming in today.  1. Cyst on scalp: Apply for orange card. Dermatology referral placed.   2. Cough: I suspect allergies given associated throat itching. Start zyrtec  3. Toenail fungus: Take lamisil x 12 weeks Check CMP at the 6 week mark which is 02/28/15-03/06/15.   4. Ear wax: use debrox drops for 3 days at a time, monthly.   F/u in 2 months with me.  F/u sooner if scalp cyst is causing pain or swelling rapidly.  Dr. Adrian [place]   Epidermal Cyst An epidermal cyst is usually a small, painless lump under the skin. Cysts often occur on the face, neck, stomach, chest, or genitals. The cyst may be filled with a bad smelling paste. Do not pop your cyst. Popping the cyst can cause pain and puffiness (swelling). HOME CARE   Only take medicines as told by your doctor.  Take your medicine (antibiotics) as told. Finish it even if you start to feel better. GET HELP RIGHT AWAY IF:  Your cyst is tender, red, or puffy.  You are not getting better, or you are getting worse.  You have any questions or concerns. MAKE SURE YOU:  Understand these instructions.  Will watch your condition.  Will get help right away if you are not doing well or get worse. Document Released: 12/21/2004 Document Revised: 05/14/2012 Document Reviewed: 05/22/2011 [facility] Patient Information 2015 [place], [place]. This information is not intended to replace advice given to you by your health care provider. Make sure you discuss any questions you have with your health care provider.

## 2015-02-22 NOTE — Progress Notes (Signed)
Complaining of knot on head x3 years ago Knot getting bigger, soft and hard in the middle Stated it causing HA No drainage

## 2015-02-22 NOTE — Progress Notes (Signed)
   Subjective:    Patient ID: [name] [name], male    DOB: Nov 06, 1961, 54 y.o.   MRN: [id number] CC: scalp lesion  HPI  1. Scalp lesion: L posterior scalp lesion. Soft and squishy. Some pain to L side of neck and L eye. No fever. Pain is mild. Patient has hx of migraines. Pain is nothing like migraines.   2. Cough: persistent. Dry cough. Associated with itching in throat. Went to pulmonologist.   Review of Systems  HENT: Positive for congestion.   Respiratory: Positive for cough. Negative for shortness of breath.   Cardiovascular: Negative for chest pain.      Objective:   Physical Exam BP 125/82 mmHg  Pulse 70  Temp(Src) 98.1 F (36.7 C) (Oral)  Resp 18  Ht 5\' 9"  (1.753 m)  Wt 159 lb (72.122 kg)  BMI 23.47 kg/m2  SpO2 98% General appearance: alert, cooperative and no distress Head: 2x2 cm, fluctuant cystic mass L posterior scalp  Eyes: conjunctivae/corneas clear. PERRL, EOM's intact.  Ears: abnormal external canal right ear - ceruminosis impacting canal and abnormal external canal left ear - ceruminosis impacting canal Nose: no discharge, turbinates normal Lungs: clear to auscultation bilaterally     Assessment & Plan:

## 2015-02-23 NOTE — Assessment & Plan Note (Signed)
Cough: I suspect allergies given associated throat itching. Start zyrtec

## 2015-02-23 NOTE — Assessment & Plan Note (Signed)
Toenail fungus: Take lamisil x 12 weeks Check CMP at the 6 week mark which is 02/28/15-03/06/15.

## 2015-02-23 NOTE — Assessment & Plan Note (Signed)
Ear wax: use debrox drops for 3 days at a time, monthly.

## 2015-02-23 NOTE — Assessment & Plan Note (Signed)
1. Cyst on scalp: Apply for orange card. Dermatology referral placed.

## 2015-03-11 ENCOUNTER — Ambulatory Visit: Payer: Self-pay | Admitting: Pulmonary Disease

## 2015-03-24 ENCOUNTER — Encounter (HOSPITAL_COMMUNITY): Payer: Self-pay

## 2015-03-24 ENCOUNTER — Ambulatory Visit: Payer: No Typology Code available for payment source | Attending: Family Medicine | Admitting: Family Medicine

## 2015-03-24 ENCOUNTER — Encounter: Payer: Self-pay | Admitting: Family Medicine

## 2015-03-24 ENCOUNTER — Ambulatory Visit (HOSPITAL_COMMUNITY): Payer: No Typology Code available for payment source | Attending: Cardiology

## 2015-03-24 ENCOUNTER — Other Ambulatory Visit (HOSPITAL_COMMUNITY): Payer: Self-pay | Admitting: Cardiology

## 2015-03-24 VITALS — BP 123/78 | HR 72 | Temp 98.4°F | Resp 18 | Ht 69.0 in | Wt 152.0 lb

## 2015-03-24 DIAGNOSIS — M79604 Pain in right leg: Secondary | ICD-10-CM

## 2015-03-24 DIAGNOSIS — H6123 Impacted cerumen, bilateral: Secondary | ICD-10-CM

## 2015-03-24 DIAGNOSIS — R053 Chronic cough: Secondary | ICD-10-CM

## 2015-03-24 DIAGNOSIS — J309 Allergic rhinitis, unspecified: Secondary | ICD-10-CM | POA: Insufficient documentation

## 2015-03-24 DIAGNOSIS — Z1211 Encounter for screening for malignant neoplasm of colon: Secondary | ICD-10-CM

## 2015-03-24 DIAGNOSIS — R05 Cough: Secondary | ICD-10-CM

## 2015-03-24 DIAGNOSIS — S83241A Other tear of medial meniscus, current injury, right knee, initial encounter: Secondary | ICD-10-CM | POA: Insufficient documentation

## 2015-03-24 DIAGNOSIS — D860 Sarcoidosis of lung: Secondary | ICD-10-CM

## 2015-03-24 DIAGNOSIS — J302 Other seasonal allergic rhinitis: Secondary | ICD-10-CM

## 2015-03-24 DIAGNOSIS — M79661 Pain in right lower leg: Secondary | ICD-10-CM

## 2015-03-24 DIAGNOSIS — Z87891 Personal history of nicotine dependence: Secondary | ICD-10-CM | POA: Insufficient documentation

## 2015-03-24 DIAGNOSIS — M7989 Other specified soft tissue disorders: Secondary | ICD-10-CM

## 2015-03-24 DIAGNOSIS — R0981 Nasal congestion: Secondary | ICD-10-CM | POA: Insufficient documentation

## 2015-03-24 LAB — CBC WITH DIFFERENTIAL/PLATELET
Basophils Absolute: 0 10*3/uL (ref 0.0–0.1)
Basophils Relative: 1 % (ref 0–1)
Eosinophils Absolute: 0 10*3/uL (ref 0.0–0.7)
Eosinophils Relative: 1 % (ref 0–5)
HCT: 38 % — ABNORMAL LOW (ref 39.0–52.0)
Hemoglobin: 12.2 g/dL — ABNORMAL LOW (ref 13.0–17.0)
Lymphocytes Relative: 25 % (ref 12–46)
Lymphs Abs: 1.2 10*3/uL (ref 0.7–4.0)
MCH: 24.8 pg — ABNORMAL LOW (ref 26.0–34.0)
MCHC: 32.1 g/dL (ref 30.0–36.0)
MCV: 77.4 fL — ABNORMAL LOW (ref 78.0–100.0)
MPV: 8.7 fL (ref 8.6–12.4)
Monocytes Absolute: 0.5 10*3/uL (ref 0.1–1.0)
Monocytes Relative: 11 % (ref 3–12)
Neutro Abs: 3 10*3/uL (ref 1.7–7.7)
Neutrophils Relative %: 62 % (ref 43–77)
Platelets: 224 10*3/uL (ref 150–400)
RBC: 4.91 MIL/uL (ref 4.22–5.81)
RDW: 15.6 % — ABNORMAL HIGH (ref 11.5–15.5)
WBC: 4.8 10*3/uL (ref 4.0–10.5)

## 2015-03-24 MED ORDER — TRIAMCINOLONE ACETONIDE 55 MCG/ACT NA AERO: 2.0000 | INHALATION_SPRAY | Freq: Every day | NASAL | Status: DC

## 2015-03-24 MED ORDER — CETIRIZINE HCL 10 MG PO TABS: 10.0000 mg | ORAL_TABLET | Freq: Every day | ORAL | Status: DC

## 2015-03-24 MED ORDER — RIVAROXABAN 15 MG PO TABS: 15.0000 mg | ORAL_TABLET | Freq: Two times a day (BID) | ORAL | Status: DC

## 2015-03-24 MED ORDER — ACETAMINOPHEN 500 MG PO TABS: 500.0000 mg | ORAL_TABLET | Freq: Four times a day (QID) | ORAL | Status: DC | PRN

## 2015-03-24 NOTE — Progress Notes (Signed)
Complaining of ear clogged. Possible sinuses  infection  Stated has a knot on rt leg, moved form ankle to midd lower extremity  Painful and swelling

## 2015-03-24 NOTE — Assessment & Plan Note (Signed)
Sarcoidosis, Dr. Gwenette Greet requested a 4 week f/u from last visit on 02/12/15. Please call for f/u appointment. Call  [phone].   No refill of prednisone now given normal lung exam

## 2015-03-24 NOTE — Assessment & Plan Note (Signed)
Normal colonoscopy.

## 2015-03-24 NOTE — Progress Notes (Signed)
   Subjective:    Patient ID: [name] [name], male    DOB: July 25, 1961, 54 y.o.   MRN: [id number] CC: sinus congestion, ? Sinusitis, R leg swelling    HPI  1. Sinus congestion: pressure and decreased hearing with soreness in L throat. No fever or chills. Used debrox and ear pressure worsened. Stopped zyrtec. Not taking prednisone although Dr. Janifer Adie last recommend a dose from 40 mg daily to 30 mg daily with a 4 week f/u.   2. R leg pain and swelling: x one week. Woke up with pain. No injury. No bed rest. No travel. No hx of DVT or PE. Some itching in leg. No rash. Pain in upper calf, medially. No treatment for pain. 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   Soc Hx: former smoker, quit   Review of Systems  Constitutional: Negative for fever and chills.  HENT: Positive for congestion, hearing loss and sinus pressure.   Respiratory: Positive for cough. Negative for chest tightness and shortness of breath.   Cardiovascular: Positive for leg swelling. Negative for chest pain.  Musculoskeletal: Positive for myalgias.       Objective:   Physical Exam BP 123/78 mmHg  Pulse 72  Temp(Src) 98.4 F (36.9 C) (Oral)  Resp 18  Ht 5\' 9"  (1.753 m)  Wt 152 lb (68.947 kg)  BMI 22.44 kg/m2  SpO2 99% General appearance: alert, cooperative and no distress Head: Normocephalic, without obvious abnormality, atraumatic Eyes: conjunctivae/corneas clear. PERRL, EOM's intact.  Ears: abnormal external canal right ear - cerumen removed by irrigation and abnormal external canal left ear - cerumen removed by irrigation Nose: no discharge, turbinates pink, swollen Throat: lips, mucosa, and tongue normal; teeth and gums normal Neck: no adenopathy, supple, symmetrical, trachea midline and thyroid not enlarged, symmetric, no tenderness/mass/nodules Lungs: clear to auscultation bilaterally Heart: regular rate and rhythm, S1, S2 normal, no murmur, click, rub or gallop Ext: R leg swelling compared  to L, tenderness in R medial calf. No erythema. No skin dimpling LN: normal inguinal LNs      Assessment & Plan:

## 2015-03-24 NOTE — Assessment & Plan Note (Signed)
Congestion: restrart zyrtec Start nasacort nose spray  3. Ear wax: flushed today

## 2015-03-24 NOTE — Assessment & Plan Note (Signed)
R leg swelling with pain in calf: I am most concerned for DVT (clot in leg)  Plan: xarelto 15 mg twice daily with food this is a blood thinner Doppler of leg ordered, D dimer and CBC with diff ordered Tylenol for pain  Elevation when resting  Cut work time in half for now 5 hrs max

## 2015-03-24 NOTE — Patient Instructions (Signed)
Mr. Slivinski,  Thank you for coming in today.  1. R leg swelling with pain in calf: I am most concerned for DVT (clot in leg)  Plan: xarelto 15 mg twice daily with food this is a blood thinner Doppler of leg ordered, D dimer and CBC with diff ordered Tylenol for pain  Elevation when resting  Cut work time in half for now 5 hrs max   2.  Congestion: restrart zyrtec Start nasacort nose spray  3. Ear wax: flushed today  4. Sarcoidosis, Dr. Gwenette Greet requested a 4 week f/u from last visit on 02/12/15. Please call for f/u appointment. Call  [phone].   No refill of prednisone now given normal lung exam   You will be called with labs and doppler results  F/u in 2 weeks with me for R leg swelling   Dr. Adrian Blackwater

## 2015-03-24 NOTE — Progress Notes (Addendum)
Lower extremity venous doppler -- unilateral right performed

## 2015-03-24 NOTE — Assessment & Plan Note (Signed)
A: ears with cerumen P: ears irrigated today

## 2015-03-25 LAB — D-DIMER, QUANTITATIVE (NOT AT ARMC): D-Dimer, Quant: 1.99 ug/mL-FEU — ABNORMAL HIGH (ref 0.00–0.48)

## 2015-03-25 MED ORDER — NAPROXEN 500 MG PO TABS: 500.0000 mg | ORAL_TABLET | Freq: Two times a day (BID) | ORAL | Status: DC

## 2015-03-25 NOTE — Addendum Note (Signed)
Addended by: Boykin Nearing on: 03/25/2015 09:05 AM   Modules accepted: Orders

## 2015-03-28 IMAGING — CR DG CHEST 1V PORT
1 series · 1 of 1 positions shown · non-contrast
Comparison: Chest radiograph January 18, 2014 and chest CT July 08, 2014

CLINICAL DATA: History of sarcoidosis, currently presenting with
chest pain and difficulty breathing

EXAM:
PORTABLE CHEST - 1 VIEW

[portable]
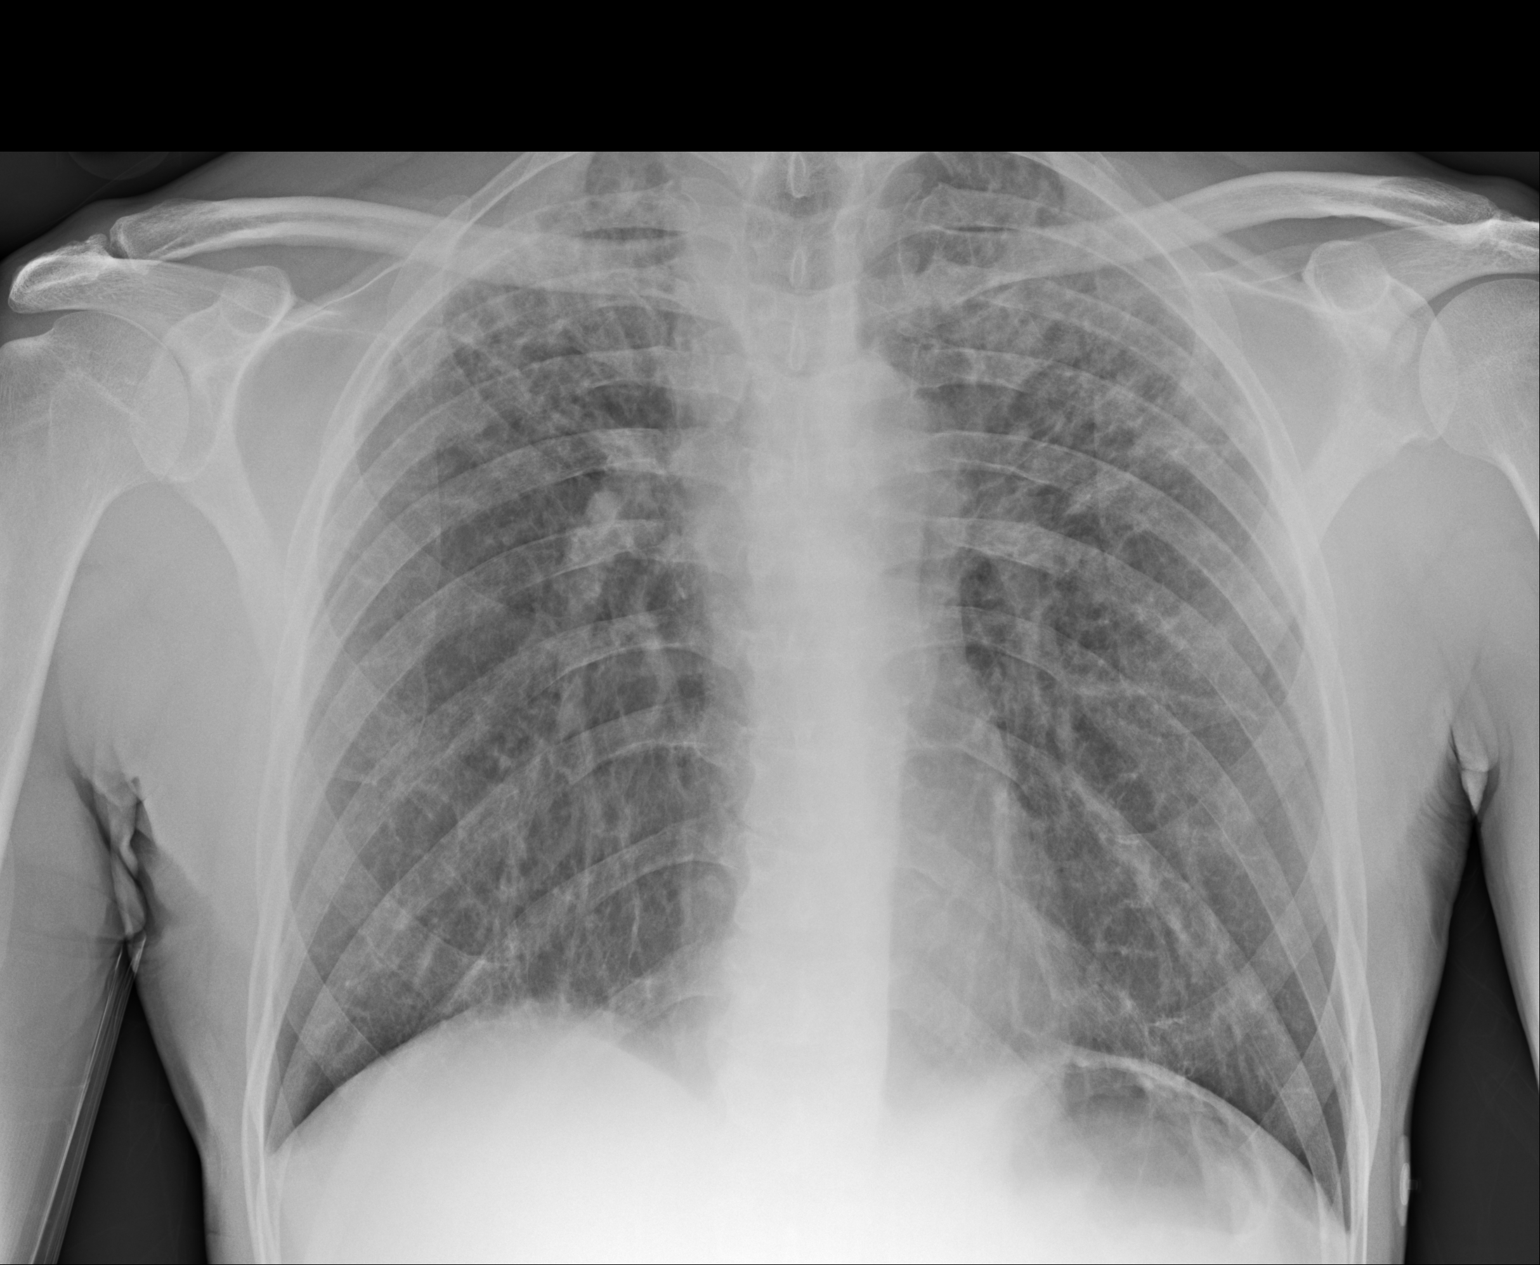

[1 of 1 positions shown; findings below may reference images not displayed]

FINDINGS: There is extensive interstitial thickening with a distinct upper
lobe prominence, a finding typically seen with sarcoidosis. Milder
scarring is seen elsewhere in the lungs. There is no airspace
consolidation. The heart size is within normal limits. Pulmonary
vascular is within normal limits. There is mediastinal and hilar
adenopathy, stable and somewhat better delineated on CT. There is no
new opacity or new adenopathy. No bone lesions.
IMPRESSION: Changes typical of sarcoidosis with adenopathy and upper lobe
predominant interstitial disease. No frank consolidation. No new
opacity.

## 2015-03-29 ENCOUNTER — Telehealth: Payer: Self-pay | Admitting: *Deleted

## 2015-03-29 NOTE — Telephone Encounter (Signed)
Left voice message with male to return call

## 2015-03-29 NOTE — Telephone Encounter (Signed)
-----   Message from Boykin Nearing, MD sent at 03/25/2015  8:55 AM EDT ----- Assessment: Patient w/o blood clot in leg on doppler. D-dimer is elevated. WBC normal. There are two areas noted on the venous doppler that are abnormal. The second area corresponds with pain.  1. An anechoic, avascular structure in the right popliteal area measuring 1.9 cm x 1.4 cm. In the right medial proximal calf,  2. there is a second avascular structure containing echogenic material and measuring 1.2 x 1.7 cm it appears to be an extension of the structure behind the knee. The latter corresponds to the patient's area of pain.  Plan:  Stop xarelto. Please add naproxen for pain control 500 mg BID with food.  F/u imaging needed, MRI with and without contrast ordered.  Patient will need blood draw to check kidney function, CMP ordered

## 2015-04-16 ENCOUNTER — Encounter: Payer: Self-pay | Admitting: Family Medicine

## 2015-04-16 ENCOUNTER — Ambulatory Visit: Payer: No Typology Code available for payment source | Attending: Family Medicine | Admitting: Family Medicine

## 2015-04-16 VITALS — BP 115/71 | HR 72 | Temp 98.6°F | Resp 16 | Ht 69.0 in | Wt 149.0 lb

## 2015-04-16 DIAGNOSIS — M25461 Effusion, right knee: Secondary | ICD-10-CM | POA: Insufficient documentation

## 2015-04-16 DIAGNOSIS — M7989 Other specified soft tissue disorders: Secondary | ICD-10-CM

## 2015-04-16 DIAGNOSIS — K219 Gastro-esophageal reflux disease without esophagitis: Secondary | ICD-10-CM

## 2015-04-16 DIAGNOSIS — Z113 Encounter for screening for infections with a predominantly sexual mode of transmission: Secondary | ICD-10-CM

## 2015-04-16 DIAGNOSIS — M79604 Pain in right leg: Secondary | ICD-10-CM

## 2015-04-16 DIAGNOSIS — M25561 Pain in right knee: Secondary | ICD-10-CM | POA: Insufficient documentation

## 2015-04-16 DIAGNOSIS — M79661 Pain in right lower leg: Secondary | ICD-10-CM

## 2015-04-16 LAB — COMPLETE METABOLIC PANEL WITH GFR
ALT: 12 U/L (ref 0–53)
AST: 19 U/L (ref 0–37)
Albumin: 4 g/dL (ref 3.5–5.2)
Alkaline Phosphatase: 59 U/L (ref 39–117)
BUN: 19 mg/dL (ref 6–23)
CO2: 26 mEq/L (ref 19–32)
Calcium: 9.3 mg/dL (ref 8.4–10.5)
Chloride: 100 mEq/L (ref 96–112)
Creat: 1.16 mg/dL (ref 0.50–1.35)
GFR, Est African American: 83 mL/min
GFR, Est Non African American: 71 mL/min
Glucose, Bld: 82 mg/dL (ref 70–99)
Potassium: 4 mEq/L (ref 3.5–5.3)
Sodium: 135 mEq/L (ref 135–145)
Total Bilirubin: 0.4 mg/dL (ref 0.2–1.2)
Total Protein: 7.7 g/dL (ref 6.0–8.3)

## 2015-04-16 MED ORDER — PANTOPRAZOLE SODIUM 40 MG PO TBEC: 40.0000 mg | DELAYED_RELEASE_TABLET | Freq: Every day | ORAL | Status: DC

## 2015-04-16 MED ORDER — IBUPROFEN 600 MG PO TABS: 600.0000 mg | ORAL_TABLET | Freq: Three times a day (TID) | ORAL | Status: DC | PRN

## 2015-04-16 NOTE — Patient Instructions (Signed)
Mr. [name] [name],  1. R knee pain and swelling: No blood clot  Continue ibuprofen as needed for pain Getting MRI of knee to evaluate joint - you will be called with appt details   2. STD screening: HIV, syphilis, gonorrhea and chlamydia screening today  F/u in 6 weeks for R knee pain and swelling   Dr. Adrian Blackwater

## 2015-04-16 NOTE — Assessment & Plan Note (Signed)
STD screening: HIV, syphilis, gonorrhea and chlamydia screening today

## 2015-04-16 NOTE — Progress Notes (Signed)
F/U Swelling rt leg Stated swelling improving, tenderness and sourness  Persisting dry cough  Doppler results

## 2015-04-16 NOTE — Addendum Note (Signed)
Addended by: Boykin Nearing on: 04/16/2015 05:18 PM   Modules accepted: Orders

## 2015-04-16 NOTE — Progress Notes (Signed)
   Subjective:    Patient ID: [name] [name], male    DOB: [birth year], 54 y.o.   MRN: [id number] CC: f/u Rt leg swelling, requesting STD screening  HPI  1. Rt leg swelling:  Stated 03/17/15. Improved. Patient stopped xarelto, RLE doppler (03/24/15) negative for DVT. Doppler positive for:  There are two areas noted on the venous doppler that are abnormal. The second area corresponds with pain.  1. An anechoic, avascular structure in the right popliteal area measuring 1.9 cm x 1.4 cm. In the right medial proximal calf,  2. there is a second avascular structure containing echogenic material and measuring 1.2 x 1.7 cm it appears to be an extension of the structure behind the knee. The latter corresponds to the patient's area of pain.   Patient taking ibuprofen for pain which helps.  2. STD testing: patient requesting STD testing. He denies urethral discharge. He denies exposure to STD.   Soc Hx: non smoker  Review of Systems  Constitutional: Negative for fever and chills.  Respiratory: Positive for cough.   Cardiovascular: Positive for leg swelling.  Musculoskeletal: Positive for joint swelling.      Objective:   Physical Exam BP 115/71 mmHg  Pulse 72  Temp(Src) 98.6 F (37 C) (Oral)  Resp 16  Ht 5\' 9"  (1.753 m)  Wt 149 lb (67.586 kg)  BMI 21.99 kg/m2  SpO2 97% General appearance: alert, cooperative and no distress Lungs: normal WOB  Extremities: posterior knee swelling w/o erythema, mild TTP Upper posterior calf swelling with mild TTP, no erythema  Skin: no rash or lesions     Assessment & Plan:

## 2015-04-16 NOTE — Assessment & Plan Note (Signed)
R knee pain and swelling: No blood clot  Continue ibuprofen as needed for pain Getting MRI of knee to evaluate joint - you will be called with appt details

## 2015-04-17 LAB — HIV ANTIBODY (ROUTINE TESTING W REFLEX): HIV 1&2 Ab, 4th Generation: NONREACTIVE

## 2015-04-17 LAB — RPR

## 2015-04-17 LAB — GC/CHLAMYDIA PROBE AMP, URINE
Chlamydia, Swab/Urine, PCR: NEGATIVE
GC Probe Amp, Urine: NEGATIVE

## 2015-04-20 ENCOUNTER — Telehealth: Payer: Self-pay | Admitting: Family Medicine

## 2015-04-20 NOTE — Telephone Encounter (Signed)
Pt is calling to obtain his results. Please follow up with pt.

## 2015-04-20 NOTE — Telephone Encounter (Signed)
Left appointment MRI information with Chris,ok per pt MRI appointment on June 10 at 0800 am arriving 15 min early at [facility]

## 2015-04-21 ENCOUNTER — Telehealth: Payer: Self-pay | Admitting: Family Medicine

## 2015-04-21 NOTE — Telephone Encounter (Signed)
Patient called to request blood work results, patient was given upcoming MRI appointment. Please f/u

## 2015-04-23 ENCOUNTER — Telehealth: Payer: Self-pay | Admitting: *Deleted

## 2015-04-23 ENCOUNTER — Telehealth: Payer: Self-pay | Admitting: Family Medicine

## 2015-04-23 NOTE — Telephone Encounter (Signed)
Office close at this time  Unable to contact Pt

## 2015-04-23 NOTE — Telephone Encounter (Signed)
-----   Message from Boykin Nearing, MD sent at 04/18/2015  1:36 PM EDT ----- STD screening negative. Patient can pick up copy of results at his convenience. Results can be mailed if he desires. CMP normal.

## 2015-04-23 NOTE — Telephone Encounter (Signed)
Pt is calling to obtain his results. Please follow up with pt.

## 2015-04-23 NOTE — Telephone Encounter (Signed)
Patient requesting blood work results.  Normal results given to patient.

## 2015-04-28 NOTE — Telephone Encounter (Signed)
Placed in error

## 2015-05-03 NOTE — Telephone Encounter (Signed)
Left message with Gerald Stabs at [facility]. Left message to call back for lab results.  When patient calls back please give normal results.

## 2015-05-07 ENCOUNTER — Telehealth: Payer: Self-pay | Admitting: Family Medicine

## 2015-05-07 ENCOUNTER — Ambulatory Visit (HOSPITAL_COMMUNITY)
Admission: RE | Admit: 2015-05-07 | Discharge: 2015-05-07 | Disposition: A | Discharge status: Home / Self Care | Payer: No Typology Code available for payment source | Source: Ambulatory Visit | Attending: Family Medicine | Admitting: Family Medicine

## 2015-05-07 DIAGNOSIS — S83231A Complex tear of medial meniscus, current injury, right knee, initial encounter: Secondary | ICD-10-CM | POA: Insufficient documentation

## 2015-05-07 DIAGNOSIS — M94261 Chondromalacia, right knee: Secondary | ICD-10-CM | POA: Insufficient documentation

## 2015-05-07 DIAGNOSIS — M25461 Effusion, right knee: Secondary | ICD-10-CM | POA: Insufficient documentation

## 2015-05-07 DIAGNOSIS — M79661 Pain in right lower leg: Secondary | ICD-10-CM

## 2015-05-07 DIAGNOSIS — X58XXXA Exposure to other specified factors, initial encounter: Secondary | ICD-10-CM | POA: Insufficient documentation

## 2015-05-07 DIAGNOSIS — M7121 Synovial cyst of popliteal space [Baker], right knee: Secondary | ICD-10-CM | POA: Insufficient documentation

## 2015-05-07 DIAGNOSIS — S83241A Other tear of medial meniscus, current injury, right knee, initial encounter: Secondary | ICD-10-CM

## 2015-05-07 DIAGNOSIS — M7989 Other specified soft tissue disorders: Secondary | ICD-10-CM

## 2015-05-07 NOTE — Assessment & Plan Note (Signed)
  MRI with medial meniscus tear and leaking  baker's cyst of the R knee Ortho referral placed

## 2015-05-07 NOTE — Telephone Encounter (Signed)
Patient called, left VM with Gerald Stabs at [facility] asking for a return call  When patient calls please give the following message.   MRI with medial meniscus tear and leaking  baker's cyst of the R knee Ortho referral placed

## 2015-05-13 ENCOUNTER — Ambulatory Visit: Payer: Self-pay

## 2015-05-15 ENCOUNTER — Emergency Department (HOSPITAL_COMMUNITY): Payer: Worker's Compensation

## 2015-05-15 ENCOUNTER — Encounter (HOSPITAL_COMMUNITY): Payer: Self-pay | Admitting: Nurse Practitioner

## 2015-05-15 ENCOUNTER — Emergency Department (HOSPITAL_COMMUNITY)
Admission: EM | Admit: 2015-05-15 | Discharge: 2015-05-15 | Disposition: A | Discharge status: Home / Self Care | Payer: Worker's Compensation | Attending: Emergency Medicine | Admitting: Emergency Medicine

## 2015-05-15 DIAGNOSIS — Z8701 Personal history of pneumonia (recurrent): Secondary | ICD-10-CM | POA: Diagnosis not present

## 2015-05-15 DIAGNOSIS — Z79899 Other long term (current) drug therapy: Secondary | ICD-10-CM | POA: Diagnosis not present

## 2015-05-15 DIAGNOSIS — Y998 Other external cause status: Secondary | ICD-10-CM | POA: Insufficient documentation

## 2015-05-15 DIAGNOSIS — S43004A Unspecified dislocation of right shoulder joint, initial encounter: Secondary | ICD-10-CM

## 2015-05-15 DIAGNOSIS — F329 Major depressive disorder, single episode, unspecified: Secondary | ICD-10-CM | POA: Insufficient documentation

## 2015-05-15 DIAGNOSIS — Z7951 Long term (current) use of inhaled steroids: Secondary | ICD-10-CM | POA: Insufficient documentation

## 2015-05-15 DIAGNOSIS — W010XXA Fall on same level from slipping, tripping and stumbling without subsequent striking against object, initial encounter: Secondary | ICD-10-CM | POA: Diagnosis not present

## 2015-05-15 DIAGNOSIS — K219 Gastro-esophageal reflux disease without esophagitis: Secondary | ICD-10-CM | POA: Diagnosis not present

## 2015-05-15 DIAGNOSIS — F419 Anxiety disorder, unspecified: Secondary | ICD-10-CM | POA: Diagnosis not present

## 2015-05-15 DIAGNOSIS — S43014A Anterior dislocation of right humerus, initial encounter: Secondary | ICD-10-CM | POA: Diagnosis not present

## 2015-05-15 DIAGNOSIS — M24419 Recurrent dislocation, unspecified shoulder: Secondary | ICD-10-CM

## 2015-05-15 DIAGNOSIS — Z87891 Personal history of nicotine dependence: Secondary | ICD-10-CM | POA: Insufficient documentation

## 2015-05-15 DIAGNOSIS — Y9289 Other specified places as the place of occurrence of the external cause: Secondary | ICD-10-CM | POA: Diagnosis not present

## 2015-05-15 DIAGNOSIS — Y9389 Activity, other specified: Secondary | ICD-10-CM | POA: Diagnosis not present

## 2015-05-15 DIAGNOSIS — Z862 Personal history of diseases of the blood and blood-forming organs and certain disorders involving the immune mechanism: Secondary | ICD-10-CM | POA: Insufficient documentation

## 2015-05-15 DIAGNOSIS — S4991XA Unspecified injury of right shoulder and upper arm, initial encounter: Secondary | ICD-10-CM | POA: Diagnosis present

## 2015-05-15 HISTORY — DX: Recurrent dislocation, unspecified shoulder: M24.419

## 2015-05-15 MED ORDER — FENTANYL CITRATE (PF) 100 MCG/2ML IJ SOLN
50.0000 ug | Freq: Once | INTRAMUSCULAR | Status: AC
  Administered 2015-05-15: 50 ug via INTRAVENOUS
  Filled 2015-05-15: qty 2

## 2015-05-15 MED ORDER — HYDROCODONE-ACETAMINOPHEN 5-325 MG PO TABS: 1.0000 | ORAL_TABLET | Freq: Four times a day (QID) | ORAL | Status: DC | PRN

## 2015-05-15 MED ORDER — FENTANYL CITRATE (PF) 100 MCG/2ML IJ SOLN
100.0000 ug | Freq: Once | INTRAMUSCULAR | Status: AC
  Administered 2015-05-15: 100 ug via INTRAVENOUS
  Filled 2015-05-15: qty 2

## 2015-05-15 MED ORDER — PROPOFOL 10 MG/ML IV BOLUS
0.5000 mg/kg | Freq: Once | INTRAVENOUS | Status: AC
  Administered 2015-05-15: 36.1 mg via INTRAVENOUS
  Filled 2015-05-15: qty 20

## 2015-05-15 MED ORDER — IBUPROFEN 800 MG PO TABS: 800.0000 mg | ORAL_TABLET | Freq: Three times a day (TID) | ORAL | Status: DC | PRN

## 2015-05-15 MED ORDER — HYDROMORPHONE HCL 1 MG/ML IJ SOLN
1.0000 mg | Freq: Once | INTRAMUSCULAR | Status: AC
  Administered 2015-05-15: 1 mg via INTRAVENOUS
  Filled 2015-05-15: qty 1

## 2015-05-15 MED ORDER — PROPOFOL 10 MG/ML IV BOLUS
INTRAVENOUS | Status: AC | PRN
  Administered 2015-05-15: 20 mg via INTRAVENOUS

## 2015-05-15 NOTE — ED Notes (Signed)
He fell on slippery floor this am and tried catch fall with R arm. C/o severe R shoulder pain since states "it feels like my bone is out of place." denies other injuries

## 2015-05-15 NOTE — ED Provider Notes (Signed)
CSN: [id number]     Arrival date & time 05/15/15  1116 History   First MD Initiated Contact with Patient 05/15/15 1139     Chief Complaint  Patient presents with  . Shoulder Injury     (Consider location/radiation/quality/duration/timing/severity/associated sxs/prior Treatment) HPI Patient presents to the emergency department with a right shoulder injury that occurred just prior to arrival.  The patient states that he was cleaning floors and there was a chemical used to strip waxing floors that he slipped on he fell, landing on his right shoulder.  Patient states that he tried to brace his fall with his right arm.  Patient states that nothing seems to make her condition, better with movement and palpation make the pain worse.  He said it feels like the shoulder is out of place.  Patient denies head injury, nausea, vomiting, weakness, dizziness, headache, blurred vision, neck pain, neck pain, chest pain, shortness of breath, or abdominal pain.  Patient did not take any medications prior to arrival for her symptoms Past Medical History  Diagnosis Date  . Sarcoidosis of lung Dx 1989  . Reflux Dx 2007  . Pancreatitis Dx 2014  . Anxiety     About 54 years of age  . Depression     At 54 years of age  . GERD (gastroesophageal reflux disease) Dx 2007  . Carpal tunnel syndrome of right wrist Dx 2015  . Alcohol abuse     Last Drink on Sep 2015  . Pneumonia 11/2014  . Hepatitis, alcoholic, acute 1696  . Hyperlipidemia     borderline, diet controlled   Past Surgical History  Procedure Laterality Date  . Knee arthroscopy    . Carpel tunnel release    . Elbow surgery    . Knee surgery Left     15 years ago   Family History  Problem Relation Age of Onset  . Diabetes Father   . Hypertension Father   . Hypertension Paternal Grandfather   . Alcohol abuse Paternal Grandfather   . Alcohol abuse Maternal Grandfather   . Alcohol abuse Maternal Grandmother   . Alcohol abuse Paternal Grandmother    . Colon cancer Neg Hx   . Rectal cancer Neg Hx   . Stomach cancer Neg Hx    History  Substance Use Topics  . Smoking status: Former Smoker    Types: Cigars  . Smokeless tobacco: Never Used     Comment: smokes occasional black &mild  . Alcohol Use: No     Comment: Patient drinks at minimum two 40oz daily     Review of Systems  All other systems negative except as documented in the HPI. All pertinent positives and negatives as reviewed in the HPI.   Allergies  Oxycodone  Home Medications   Prior to Admission medications   Medication Sig Start Date End Date Taking? Authorizing Provider  acetaminophen (TYLENOL) 500 MG tablet Take 1 tablet (500 mg total) by mouth every 6 (six) hours as needed. 03/24/15  Yes Josalyn Funches, MD  escitalopram (LEXAPRO) 20 MG tablet Take 1 tablet (20 mg total) by mouth daily. 02/22/15  Yes Josalyn Funches, MD  ibuprofen (ADVIL,MOTRIN) 600 MG tablet Take 1 tablet (600 mg total) by mouth every 8 (eight) hours as needed. 04/16/15  Yes Josalyn Funches, MD  pantoprazole (PROTONIX) 40 MG tablet Take 1 tablet (40 mg total) by mouth daily. For acid reflux 04/16/15  Yes Boykin Nearing, MD  albuterol (PROVENTIL HFA;VENTOLIN HFA) 108 (90 BASE) MCG/ACT inhaler  Inhale 2 puffs into the lungs every 6 (six) hours as needed for wheezing or shortness of breath. Patient not taking: Reported on 05/15/2015 11/23/14   Boykin Nearing, MD  cetirizine (ZYRTEC) 10 MG tablet Take 1 tablet (10 mg total) by mouth daily. Patient not taking: Reported on 05/15/2015 03/24/15   Boykin Nearing, MD  triamcinolone (NASACORT AQ) 55 MCG/ACT AERO nasal inhaler Place 2 sprays into the nose daily. Patient not taking: Reported on 05/15/2015 03/24/15   Josalyn Funches, MD   BP 125/83 mmHg  Pulse 51  Temp(Src) 97.5 F (36.4 C) (Oral)  Resp 15  Ht 5\' 9"  (1.753 m)  Wt 159 lb (72.122 kg)  BMI 23.47 kg/m2  SpO2 100% Physical Exam  Constitutional: He is oriented to person, place, and time. He  appears well-developed and well-nourished.  HENT:  Head: Normocephalic and atraumatic.  Cardiovascular: Normal rate, regular rhythm and normal heart sounds.   Pulmonary/Chest: Effort normal and breath sounds normal.  Musculoskeletal:       Right shoulder: He exhibits decreased range of motion, tenderness, deformity and pain. He exhibits no bony tenderness, no swelling, no effusion, no crepitus, normal pulse and normal strength.  Neurological: He is alert and oriented to person, place, and time.  Skin: Skin is warm and dry.  Nursing note and vitals reviewed.   ED Course  Reduction of dislocation Date/Time: 05/18/2015 3:27 PM Performed by: Dalia Heading Authorized by: Dalia Heading Consent: Verbal consent obtained. Risks and benefits: risks, benefits and alternatives were discussed Consent given by: patient Patient understanding: patient states understanding of the procedure being performed Patient consent: the patient's understanding of the procedure matches consent given Procedure consent: procedure consent matches procedure scheduled Relevant documents: relevant documents present and verified Test results: test results available and properly labeled Site marked: the operative site was marked Imaging studies: imaging studies available Patient identity confirmed: verbally with patient Time out: Immediately prior to procedure a "time out" was called to verify the correct patient, procedure, equipment, support staff and site/side marked as required. Local anesthesia used: no Patient sedated: yes Sedation type: moderate (conscious) sedation Sedatives: propofol Analgesia: fentanyl and hydromorphone Vitals: Vital signs were monitored during sedation. Patient tolerance: Patient tolerated the procedure well with no immediate complications   (including critical care time) Labs Review Labs Reviewed - No data to display  Imaging Review Dg Shoulder Right  05/15/2015    CLINICAL DATA:  Fall this morning.  Shoulder pain.  EXAM: RIGHT SHOULDER - 2+ VIEW  COMPARISON:  None.  FINDINGS: There is complete anterior dislocation of the humeral head with respect to the glenoid. No underlying fracture. Heterogeneous opacities at the lung apices are stable compared with prior radiographs. Mild degenerative change of the right [place] joint.  IMPRESSION: Anterior glenohumeral dislocation.   Electronically Signed   By: Marybelle Killings M.D.   On: 05/15/2015 12:35   Dg Shoulder Right Port  05/15/2015   CLINICAL DATA:  Postreduction for shoulder dislocation  EXAM: PORTABLE RIGHT SHOULDER - 2+ VIEW  COMPARISON:  Study obtained earlier in the day ; chest radiograph January 22, 2015  FINDINGS: Frontal and Y scapular images obtained. Anterior dislocation has been reduced. Currently no fracture or dislocation. There is mild osteoarthritic change in the acromioclavicular joint. There is stable scarring/fibrosis in the visualized right upper and mid lung regions.  IMPRESSION: Reduction of prior anterior dislocation. Currently no fracture or dislocation apparent.   Electronically Signed   By: Lowella Grip III M.D.   On:  05/15/2015 16:01   Patient was placed in a sling and will be given follow-up with orthopedics.  Told to return here as needed.  Told to use ice, heat on the shoulder    Dalia Heading, PA-C 05/18/15 [place], MD 05/19/15 [place], MD 05/19/15 [place], MD 05/19/15 1513

## 2015-05-15 NOTE — Discharge Instructions (Signed)
Return here as needed.  Follow-up with the orthopedist provided.  Wear the sling until follow-up

## 2015-05-20 ENCOUNTER — Ambulatory Visit: Payer: No Typology Code available for payment source | Attending: Family Medicine | Admitting: Family Medicine

## 2015-05-20 ENCOUNTER — Encounter: Payer: Self-pay | Admitting: Family Medicine

## 2015-05-20 VITALS — BP 144/91 | HR 60 | Temp 98.4°F | Resp 16 | Ht 69.0 in | Wt 150.0 lb

## 2015-05-20 DIAGNOSIS — S43004D Unspecified dislocation of right shoulder joint, subsequent encounter: Secondary | ICD-10-CM

## 2015-05-20 DIAGNOSIS — W19XXXA Unspecified fall, initial encounter: Secondary | ICD-10-CM | POA: Insufficient documentation

## 2015-05-20 DIAGNOSIS — S83241D Other tear of medial meniscus, current injury, right knee, subsequent encounter: Secondary | ICD-10-CM | POA: Insufficient documentation

## 2015-05-20 DIAGNOSIS — X58XXXD Exposure to other specified factors, subsequent encounter: Secondary | ICD-10-CM | POA: Insufficient documentation

## 2015-05-20 DIAGNOSIS — S43004A Unspecified dislocation of right shoulder joint, initial encounter: Secondary | ICD-10-CM | POA: Insufficient documentation

## 2015-05-20 DIAGNOSIS — Z113 Encounter for screening for infections with a predominantly sexual mode of transmission: Secondary | ICD-10-CM | POA: Insufficient documentation

## 2015-05-20 DIAGNOSIS — S83241A Other tear of medial meniscus, current injury, right knee, initial encounter: Secondary | ICD-10-CM

## 2015-05-20 DIAGNOSIS — Z87891 Personal history of nicotine dependence: Secondary | ICD-10-CM | POA: Insufficient documentation

## 2015-05-20 DIAGNOSIS — G5601 Carpal tunnel syndrome, right upper limb: Secondary | ICD-10-CM

## 2015-05-20 MED ORDER — GABAPENTIN 300 MG PO CAPS: 300.0000 mg | ORAL_CAPSULE | Freq: Three times a day (TID) | ORAL | Status: DC

## 2015-05-20 NOTE — Assessment & Plan Note (Signed)
A; patient is wearing sling. Fair pain control, not taking  vicodin given substance abuse hx in treatment. Having paraesthesia in R radial hand, mostly thumb. Has ortho f/u on 05/24/2015 with Dr. Mardelle Matte P: Keep f/u with Dr. Mardelle Matte Continue ibuprofen 800 mg  Add gabapentin 300 mg three times daily can increase to 600 mg up to three times daily as needed for hand tingling, numbness, pens and needles

## 2015-05-20 NOTE — Assessment & Plan Note (Addendum)
R knee medial meniscal tear: Ortho referral, I ask the referral coordinator to cancel the sports medicine referral and inquire if Dr. Mardelle Matte could possibly evaluate your R knee as well

## 2015-05-20 NOTE — Progress Notes (Signed)
F/U carpal tunnel, shoulder pain due to injury

## 2015-05-20 NOTE — Progress Notes (Signed)
   Subjective:    Patient ID: [name] [name], male    DOB: 1961/04/09, 54 y.o.   MRN: [id number] CC: f/u carpal tunnel, shoulder pain due to injury  HPI 54 yo F with hx of ETOH abuse in remission, R carpal tunnel release in 07/2014  1. R shoulder dislocation: slip and fall injury 05/15/15. dislocation reduced in ED. Has ortho f/u having pain in shoulder. Taking ibuprofen only. Not taking vicodin.   2. R medial meniscus tear: confirmed on MRI. Patient with persistent pain with prolonged walking or standing. No swelling. Has appt at [facility], needs ortho appt for this type of injury.   3. STD screen: negative testing. Requesting a letter.    Soc Hx: former smoker  Review of Systems  Musculoskeletal: Positive for arthralgias.  Neurological: Positive for numbness.      Objective:   Physical Exam BP 144/91 mmHg  Pulse 60  Temp(Src) 98.4 F (36.9 C) (Oral)  Resp 16  Ht 5\' 9"  (1.753 m)  Wt 150 lb (68.04 kg)  BMI 22.14 kg/m2  SpO2 98% General appearance: alert, cooperative and no distress Extremities:  Wearing R shoulder sling R wrist TTP distal radius R knee no edema, no redness, no joint line tenderness       Assessment & Plan:

## 2015-05-20 NOTE — Assessment & Plan Note (Signed)
3. STD testing: negative, letter provided

## 2015-05-20 NOTE — Patient Instructions (Signed)
Mr. [name] [name],  Thank you for coming in today I am sorry for the recent injuries and joint issues  1. R shoulder dislocation  Keep f/u with Dr. Mardelle Matte on 05/24/2015 at 10:15 AM Continue ibuprofen 800 mg  Add gabapentin 300 mg three times daily can increase to 600 mg up to three times daily as needed for hand tingling, numbness, pens and needles   2. R knee medial meniscal tear: Ortho referral, I ask the referral coordinator to cancel the sports medicine referral and inquire if Dr. Mardelle Matte could possibly evaluate your R knee as well  3. STD testing: negative, letter provided  F/u in 6 weeks for R shoulder dislocation and R knee meniscal tear  Dr. Adrian Blackwater

## 2015-05-28 ENCOUNTER — Ambulatory Visit: Payer: Self-pay | Admitting: Family Medicine

## 2015-06-15 ENCOUNTER — Ambulatory Visit: Payer: No Typology Code available for payment source | Attending: Family Medicine

## 2015-06-21 ENCOUNTER — Other Ambulatory Visit: Payer: Self-pay | Admitting: Orthopedic Surgery

## 2015-06-21 DIAGNOSIS — M25531 Pain in right wrist: Secondary | ICD-10-CM

## 2015-06-22 ENCOUNTER — Ambulatory Visit
Admission: RE | Admit: 2015-06-22 | Discharge: 2015-06-22 | Disposition: A | Discharge status: Home / Self Care | Payer: No Typology Code available for payment source | Source: Ambulatory Visit | Attending: Orthopedic Surgery | Admitting: Orthopedic Surgery

## 2015-06-22 DIAGNOSIS — M25531 Pain in right wrist: Secondary | ICD-10-CM

## 2015-07-01 ENCOUNTER — Ambulatory Visit: Payer: Self-pay | Admitting: Family Medicine

## 2015-07-07 ENCOUNTER — Ambulatory Visit: Payer: No Typology Code available for payment source | Attending: Family Medicine | Admitting: Family Medicine

## 2015-07-07 ENCOUNTER — Encounter: Payer: Self-pay | Admitting: Family Medicine

## 2015-07-07 VITALS — BP 126/82 | HR 58 | Temp 98.5°F | Resp 16 | Ht 69.0 in | Wt 151.0 lb

## 2015-07-07 DIAGNOSIS — Z87891 Personal history of nicotine dependence: Secondary | ICD-10-CM | POA: Insufficient documentation

## 2015-07-07 DIAGNOSIS — Z23 Encounter for immunization: Secondary | ICD-10-CM

## 2015-07-07 DIAGNOSIS — S43004D Unspecified dislocation of right shoulder joint, subsequent encounter: Secondary | ICD-10-CM

## 2015-07-07 DIAGNOSIS — F411 Generalized anxiety disorder: Secondary | ICD-10-CM

## 2015-07-07 DIAGNOSIS — M25531 Pain in right wrist: Secondary | ICD-10-CM

## 2015-07-07 DIAGNOSIS — M25511 Pain in right shoulder: Secondary | ICD-10-CM | POA: Insufficient documentation

## 2015-07-07 DIAGNOSIS — L219 Seborrheic dermatitis, unspecified: Secondary | ICD-10-CM

## 2015-07-07 DIAGNOSIS — G8929 Other chronic pain: Secondary | ICD-10-CM

## 2015-07-07 DIAGNOSIS — K219 Gastro-esophageal reflux disease without esophagitis: Secondary | ICD-10-CM

## 2015-07-07 DIAGNOSIS — F17210 Nicotine dependence, cigarettes, uncomplicated: Secondary | ICD-10-CM | POA: Insufficient documentation

## 2015-07-07 MED ORDER — ESCITALOPRAM OXALATE 20 MG PO TABS: 20.0000 mg | ORAL_TABLET | Freq: Every day | ORAL | Status: DC

## 2015-07-07 MED ORDER — PANTOPRAZOLE SODIUM 40 MG PO TBEC: 40.0000 mg | DELAYED_RELEASE_TABLET | Freq: Every day | ORAL | Status: DC

## 2015-07-07 MED ORDER — NABUMETONE 500 MG PO TABS: 500.0000 mg | ORAL_TABLET | Freq: Two times a day (BID) | ORAL | Status: DC | PRN

## 2015-07-07 MED ORDER — ACETAMINOPHEN-CODEINE #3 300-30 MG PO TABS: 1.0000 | ORAL_TABLET | Freq: Three times a day (TID) | ORAL | Status: DC | PRN

## 2015-07-07 MED ORDER — FLUOCINOLONE ACETONIDE 0.01 % EX SHAM: 1.0000 "application " | MEDICATED_SHAMPOO | Freq: Every day | CUTANEOUS | Status: DC

## 2015-07-07 MED ORDER — HYDROCORTISONE VALERATE 0.2 % EX CREA
1.0000 | TOPICAL_CREAM | Freq: Two times a day (BID) | CUTANEOUS | Status: DC
Start: 2015-07-07 — End: 2015-07-08

## 2015-07-07 NOTE — Progress Notes (Signed)
F/U Wrist and shoulder injury on 05/15/2015 Still with pain. Pain worsen with movement

## 2015-07-07 NOTE — Patient Instructions (Signed)
[name],  Thank you for coming in today  1. Seborrhea: Tea tree oil at night after steroid cream Steroid cream in morning followed by facial sunscreen moisturizer  Shampoo for scalp can use daily   2. R shoulder and wrist pain: Changed antiinflammatory from ibuprofen to nabumetone Tylenol #3 for pain Referral to physical therapy  F/u in Dr. Mardelle Matte if therapy the above medications do not improve symptoms   F/u with me in 2 months  Dr. Adrian Blackwater

## 2015-07-07 NOTE — Progress Notes (Signed)
   Subjective:    Patient ID: [name] [name], male    DOB: Feb 11, 1961, 54 y.o.   MRN: [id number] CC: f/u R shoulder and R wrist pain  HPI 54 yo M with hx of substance presents for f/u visit:  1. R shoulder pain: persistent pain following anterior shoulder dislocation 05/15/2015. Followed bu ortho. Pain with adduction of arm. No new trauma. Taking ibuprofen but it does not help much with pain.   2. R wrist pain: this is a chronic problem. Pain in wrist and hand. Pain in hand interfering with grip and ability to work. He has a hx of carpal tunnel in R wrist with release surgery in 2015. No new trauma. Radial wrist pain. No swelling. Patient did not get PT after R wrist carpal tunnel surgery.   3. 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  slight improvement but persistent on face and scalp. Non tender. Requesting additional treatment. Has tried ketoconazole cream.   Social History  Substance Use Topics  . Smoking status: Former Smoker    Types: Cigars  . Smokeless tobacco: Never Used     Comment: smokes occasional black &mild  . Alcohol Use: No     Comment: Patient drinks at minimum two 40oz daily    Review of Systems  Constitutional: Negative for fever, chills, fatigue and unexpected weight change.  Eyes: Negative for visual disturbance.  Respiratory: Negative for cough and shortness of breath.   Cardiovascular: Negative for chest pain, palpitations and leg swelling.  Gastrointestinal: Negative for nausea, vomiting, abdominal pain, diarrhea, constipation and blood in stool.  Endocrine: Negative for polydipsia, polyphagia and polyuria.  Musculoskeletal: Positive for arthralgias. Negative for myalgias, back pain, gait problem and neck pain.  Skin: Positive for rash.  Allergic/Immunologic: Negative for immunocompromised state.  Hematological: Negative for adenopathy. Does not bruise/bleed easily.  Psychiatric/Behavioral: Negative for suicidal ideas, sleep disturbance and dysphoric mood. The  patient is nervous/anxious.   GAD-7: score of 3. 1-1,4,5. All others 0.     Objective:   Physical Exam  Constitutional: He appears well-developed and well-nourished. No distress.  Eyes: Conjunctivae are normal. Pupils are equal, round, and reactive to light.  Neck: Normal range of motion. Neck supple.  Cardiovascular: Normal rate, regular rhythm, normal heart sounds and intact distal pulses.   Pulmonary/Chest: Effort normal and breath sounds normal.  Musculoskeletal: He exhibits no edema.       Arms: Neurological: He is alert.  Skin: Skin is warm and dry. No erythema.  Flaky and scaly skin on scalp, eyebrows, nasal folds   Psychiatric: He has a normal mood and affect. His behavior is normal.  BP 126/82 mmHg  Pulse 58  Temp(Src) 98.5 F (36.9 C) (Oral)  Resp 16  Ht 5\' 9"  (1.753 m)  Wt 151 lb (68.493 kg)  BMI 22.29 kg/m2  SpO2 100%       Assessment & Plan:

## 2015-07-08 ENCOUNTER — Encounter: Payer: Self-pay | Admitting: Family Medicine

## 2015-07-08 MED ORDER — KETOCONAZOLE 2 % EX SHAM: 1.0000 "application " | MEDICATED_SHAMPOO | CUTANEOUS | Status: DC

## 2015-07-08 MED ORDER — HYDROCORTISONE 2.5 % EX CREA
TOPICAL_CREAM | Freq: Two times a day (BID) | CUTANEOUS | Status: DC
Start: 2015-07-08 — End: 2016-01-11

## 2015-07-08 NOTE — Assessment & Plan Note (Signed)
Seborrhea: Tea tree oil at night after steroid cream Steroid cream in morning followed by facial sunscreen moisturizer  Shampoo for scalp can use daily

## 2015-07-08 NOTE — Assessment & Plan Note (Signed)
R shoulder pain following anterior dislocation two months ago  Changed antiinflammatory from ibuprofen to nabumetone Tylenol #3 for pain Referral to physical therapy

## 2015-07-08 NOTE — Assessment & Plan Note (Signed)
Chronic R  wrist pain:  Changed antiinflammatory from ibuprofen to nabumetone Tylenol #3 for pain Referral to physical therapy

## 2015-08-18 ENCOUNTER — Other Ambulatory Visit: Payer: Self-pay | Admitting: Family Medicine

## 2015-08-19 ENCOUNTER — Ambulatory Visit: Payer: No Typology Code available for payment source | Attending: Family Medicine | Admitting: Physical Therapy

## 2015-09-01 ENCOUNTER — Ambulatory Visit: Payer: No Typology Code available for payment source | Attending: Family Medicine | Admitting: Physical Therapy

## 2015-09-01 DIAGNOSIS — R293 Abnormal posture: Secondary | ICD-10-CM

## 2015-09-01 DIAGNOSIS — M25511 Pain in right shoulder: Secondary | ICD-10-CM

## 2015-09-01 DIAGNOSIS — R29898 Other symptoms and signs involving the musculoskeletal system: Secondary | ICD-10-CM | POA: Insufficient documentation

## 2015-09-01 DIAGNOSIS — M25531 Pain in right wrist: Secondary | ICD-10-CM | POA: Insufficient documentation

## 2015-09-01 NOTE — Therapy (Addendum)
[place], [place], [address] Phone: [phone]   Fax:  [phone]  Physical Therapy Evaluation  Patient Details  Name: [name] [name] MRN: [id number] Date of Birth: February 02, 1961 Referring Provider:  Boykin Nearing, MD  Encounter Date: 09/01/2015      PT End of Session - 09/01/15 0823    Visit Number 1   Number of Visits 16   Date for PT Re-Evaluation 10/27/15   PT Start Time 0740   PT Stop Time 0818   PT Time Calculation (min) 38 min   Activity Tolerance Patient tolerated treatment well   Behavior During Therapy [facility] for tasks assessed/performed      Past Medical History  Diagnosis Date  . Sarcoidosis of lung Dx 1989  . Reflux Dx 2007  . Pancreatitis Dx 2014  . Anxiety     About 54 years of age  . Depression     At 54 years of age  . GERD (gastroesophageal reflux disease) Dx 2007  . Carpal tunnel syndrome of right wrist Dx 2015  . Alcohol abuse     Last Drink on Sep 2015  . Pneumonia 11/2014  . Hepatitis, alcoholic, acute 7741  . Hyperlipidemia     borderline, diet controlled  . 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 Knee arthroscopy    . Carpel tunnel release Right 07/28/2014     done in [place]   . Elbow surgery    . Knee surgery Left     15 years ago    There were no vitals filed for this visit.  Visit Diagnosis:  Right wrist pain - Plan: PT plan of care cert/re-cert  Right shoulder pain - Plan: PT plan of care cert/re-cert  Weakness of right arm - Plan: PT plan of care cert/re-cert  Abnormal posture - Plan: PT plan of care cert/re-cert      Subjective Assessment - 09/01/15 0745    Subjective Pt. is a 54 y/o male who presents to OPPT s/p R shoulder dislocation and R wrist pain on 05/15/15.  Pt stated he slipped on floors at work falling onto R wrist/hand and resulting in R shoulder dislocation.  Pt  presents today with continued pain and difficulty with grip strength affecting ADLs.  Pt also states some shoulder pain with movements across body and internal rotation.   Pertinent History R carpal tunnel 2014   Limitations Lifting;House hold activities   Diagnostic tests xray: arthritis in R thumb/wrist; internal R shoulder bruising   Patient Stated Goals improve pain and strength, increase use of R hand   Currently in Pain? Yes   Pain Score 6    Pain Location Wrist   Pain Orientation Right   Pain Descriptors / Indicators Constant;Sore   Pain Type Chronic pain   Pain Onset More than a month ago   Pain Frequency Intermittent   Aggravating Factors  movement; actively using wrist   Pain Relieving Factors avoiding provoking positions   Multiple Pain Sites Yes   Pain Score 4   Pain Location Shoulder   Pain Orientation Right   Pain Descriptors / Indicators Sore   Pain Type Chronic pain   Pain Onset More than a month ago   Aggravating Factors  movement   Pain Relieving Factors rest            [facility] PT Assessment - 09/01/15 0751    Assessment  Medical Diagnosis R shoulder dislocation/R wrist pain following fall   Onset Date/Surgical Date 05/15/15   Hand Dominance Right   Next MD Visit 09/03/15   Precautions   Precaution Comments no heavy lifting   Restrictions   Weight Bearing Restrictions No   Balance Screen   Has the patient fallen in the past 6 months Yes   How many times? 1   Has the patient had a decrease in activity level because of a fear of falling?  No   Is the patient reluctant to leave their home because of a fear of falling?  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 on restrictions   Cognition   Overall Cognitive Status Within Functional Limits for  tasks assessed   Posture/Postural Control   Posture/Postural Control Postural limitations   Postural Limitations Rounded Shoulders;Forward head   AROM   Overall AROM Comments pain with all motions   AROM Assessment Site Shoulder;Wrist   Right Shoulder Flexion --  WNL with pain   Right Shoulder ABduction 130 Degrees   Right Shoulder Internal Rotation --  FIR limited to R buttock with pain   Right Shoulder External Rotation --  FER limited with pain   Right Wrist Extension 85 Degrees   Right Wrist Flexion 50 Degrees   Right Wrist Radial Deviation 20 Degrees   Right Wrist Ulnar Deviation 30 Degrees   Strength   Overall Strength Comments pain with all testing   Strength Assessment Site Shoulder;Elbow;Hand;Wrist;Forearm   Right Shoulder Flexion 4/5   Right Shoulder ABduction 4/5   Right Shoulder Internal Rotation 3+/5   Right Shoulder External Rotation 3+/5   Left Shoulder Flexion 5/5   Left Shoulder ABduction 5/5   Left Shoulder Internal Rotation 5/5   Left Shoulder External Rotation 5/5   Right/Left Elbow Left;Right   Right Elbow Flexion 5/5   Right Elbow Extension 5/5   Left Elbow Flexion 5/5   Left Elbow Extension 5/5   Right/Left Forearm Right;Left   Right Forearm Pronation 5/5   Right Forearm Supination 3+/5   Left Forearm Pronation 5/5   Left Forearm Supination 5/5   Right/Left Wrist Right;Left   Right Wrist Flexion 3/5   Right Wrist Extension 4/5   Left Wrist Flexion 5/5   Left Wrist Extension 5/5   Right Hand Grip (lbs) 58.3  58, 58, 59   Left Hand Grip (lbs) 68.67  81, 54, 71   Palpation   Palpation comment noticable R hand and UT atrophy; tenderness along R wrist joint line and thumb; no significant tenderness noted with palpation R shoulder                           PT Education - 09/01/15 0822    Education provided Yes   Education Details scap retraction; gradual increase in RUE use; POC and goals of care   Person(s) Educated Patient    Methods Explanation;Handout   Comprehension Verbalized understanding          PT Short Term Goals - 09/01/15 0825    PT SHORT TERM GOAL #1   Title independent with HEP (09/29/15)   Time 4   Period Weeks   Status New   PT SHORT TERM GOAL #2   Title improve  R shoulder AROM abduction to 145 degrees for improved function (09/29/15)   Time 4   Period Weeks   Status New           PT Long Term Goals - 09/01/15 8119    PT LONG TERM GOAL #1   Title verbalize understanding of posture/body mechanics to decrease risk of reinjury (10/27/15)   Time 8   Period Weeks   Status New   PT LONG TERM GOAL #2   Title improve R shoulder ROM to WNL all motions for improved function (10/27/15)   Time 8   Period Weeks   Status New   PT LONG TERM GOAL #3   Title improve R grip strength to 70# for improved strength and function (10/27/15)   Time 8   Period Weeks   Status New   PT LONG TERM GOAL #4   Title report pain < 4/10 shoulder and wrist with activity for improved pain and function (10/27/15)   Time 8   Period Weeks   Status New               Plan - 09/01/15 0823    Clinical Impression Statement Pt is a 54 y/o male who presents to OPPT with R shoulder pain and R wrist pain following fall at work.  Fall resulted in R shoulder dislocation s/p reduction and pt demonstrates residual pain, weakness and ROM limitations.  Pt will benefit from skilled PT to improve pain and mobiltiy and ensure safe return to ADLs and work without difficulty.   Pt will benefit from skilled therapeutic intervention in order to improve on the following deficits Postural dysfunction;Decreased strength;Pain;Impaired UE functional use;Decreased range of motion   Rehab Potential Good   PT Frequency 2x / week   PT Duration 8 weeks   PT Treatment/Interventions ADLs/Self Care Home Management;Electrical Stimulation;Cryotherapy;Iontophoresis 29m/ml Dexamethasone;Moist Heat;Therapeutic exercise;Therapeutic  activities;Functional mobility training;Ultrasound;Patient/family education;Manual techniques;Taping;Vasopneumatic Device;Passive range of motion   PT Next Visit Plan HEP for ROM and strengthening; postural exercises   Consulted and Agree with Plan of Care Patient         Problem List Patient Active Problem List   Diagnosis Date Noted  . Chronic pain of right wrist 07/07/2015  . Carpal tunnel syndrome of right wrist 05/20/2015  . Dislocation of right shoulder joint 05/20/2015  . Allergic rhinitis 03/24/2015  . Tear of medial meniscus of right knee 03/24/2015  . Epidermal cyst 02/22/2015  . 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[facility] 01/27/2015  . Bron[place]ctasis without acute exacerbation ([facility] 01/27/2015  . Chronic cough 01/15/2015  . Onychomycosis of toenail 12/15/2014  . Alcohol abuse, in remission 11/11/2014  . Seborrhea 11/11/2014  . GERD (gastroesophageal reflux disease)   . Generalized anxiety disorder 09/02/2013   SLaureen Abrahams PT, DPT 09/01/2015 8:31 AM   [facility] [address]Phone: [phone]  Fax:  [phone]     PHYSICAL THERAPY DISCHARGE SUMMARY  Visits from Start of Care: 1  Current functional level related to goals / functional outcomes: See above   Remaining deficits: unknown   Education / Equipment: n/a  Plan: Patient agrees to discharge.  Patient goals were not met. Patient is being discharged due to financial reasons.  ?????    Pt had no insurance for PT visits and unable to proceed as self-pay.  SLaureen Abrahams PT, DPT 02/03/2016 3:45 PM  [facility] [address] [place] [address] [phone](  office) [phone] (fax)

## 2015-09-01 NOTE — Patient Instructions (Signed)
Scapular Retraction (Standing or Sitting)   With arms at sides, pinch shoulder blades together. Repeat _10-15___ times per set. Do __1__ sets per session. Do __2-3__ sessions per day.  http://orth.exer.us/944   Copyright  VHI. All rights reserved.   Begin using your right arm/hand more at home about 10-15% more.  Opening bottles, reaching, etc.  Do not over do it!

## 2015-09-03 ENCOUNTER — Ambulatory Visit: Payer: Self-pay | Admitting: Family Medicine

## 2015-09-10 ENCOUNTER — Ambulatory Visit: Payer: No Typology Code available for payment source | Admitting: Physical Therapy

## 2015-09-14 ENCOUNTER — Ambulatory Visit: Payer: Worker's Compensation

## 2015-09-14 ENCOUNTER — Ambulatory Visit (INDEPENDENT_AMBULATORY_CARE_PROVIDER_SITE_OTHER): Payer: Worker's Compensation | Admitting: Family Medicine

## 2015-09-14 VITALS — BP 124/64 | HR 58 | Temp 98.1°F | Resp 16 | Ht 69.0 in | Wt 136.0 lb

## 2015-09-14 DIAGNOSIS — Y990 Civilian activity done for income or pay: Secondary | ICD-10-CM

## 2015-09-14 DIAGNOSIS — M79642 Pain in left hand: Secondary | ICD-10-CM

## 2015-09-14 DIAGNOSIS — S60222A Contusion of left hand, initial encounter: Secondary | ICD-10-CM | POA: Diagnosis not present

## 2015-09-14 MED ORDER — NAPROXEN 500 MG PO TABS: 500.0000 mg | ORAL_TABLET | Freq: Two times a day (BID) | ORAL | Status: DC

## 2015-09-14 NOTE — Progress Notes (Signed)
Patient ID: [name] [name], male    DOB: November 11, 1961  Age: 54 y.o. MRN: [id number]  Chief Complaint  Patient presents with  . Hand Injury    W/C - Hit left hand on Dumpster Thursday - pain on top of hand    Subjective:   54 year old man who had an accident on Thursday the 13th when he threw a bag of trash into a dumpster and cracked his left hand hard against the metal rim of the dumpster somehow. He hurt like he did hit his funny bone. However that night it started throbbing bad during the night. It is persisted in hurting him quite a lot. There is some swelling on the back of his left hand. It is painful if he flexes or extends the wrist. He has continued working. He took some OTC ibuprofen without much relief. He is a school custodian.  Current allergies, medications, problem list, past/family and social histories reviewed.  Objective:  BP 124/64 mmHg  Pulse 58  Temp(Src) 98.1 F (36.7 C) (Oral)  Resp 16  Ht 5\' 9"  (1.753 m)  Wt 136 lb (61.689 kg)  BMI 20.07 kg/m2  SpO2 99%  No major acute distress. Swelling is visible on the back of his left hand at the base of the second and third metatarsals distal to the wrist. Motion of the wrist is painful however. It is very tender in the area of swelling.  Assessment & Plan:   Assessment: 1. Work related injury   2. Left hand pain   3. Hand contusion, left, initial encounter       Plan: Work related accident with contusion of left hand, will get an x-ray.  Orders Placed This Encounter  Procedures  . DG Hand Complete Left    Order Specific Question:  Reason for Exam (SYMPTOM  OR DIAGNOSIS REQUIRED)    Answer:  contusion left heand base of second and third metacarpals    Order Specific Question:  Preferred imaging location?    Answer:  External    Meds ordered this encounter  Medications  . naproxen (NAPROSYN) 500 MG tablet    Sig: Take 1 tablet (500 mg total) by mouth 2 (two) times daily with a meal.    Dispense:  30 tablet   Refill:  0    [facility] reading (PRIMARY) by  Dr. Linna Darner Normal wrist and hand.       Patient Instructions  Take the naproxen 500 mg 1 pill twice daily with breakfast and supper for pain and inflammation  Wear the wrist splint  Return in 10 days for a recheck  Return sooner if further problems  If additional pain relief is needed to can take acetaminophen (Tylenol) 500 mg 2 tablets 3 times daily      No Follow-up on file.   HOPPER,DAVID, MD 09/14/2015

## 2015-09-14 NOTE — Patient Instructions (Addendum)
Take the naproxen 500 mg 1 pill twice daily with breakfast and supper for pain and inflammation  Wear the wrist splint  Return in 10 days for a recheck  Return sooner if further problems  If additional pain relief is needed to can take acetaminophen (Tylenol) 500 mg 2 tablets 3 times daily

## 2015-09-15 ENCOUNTER — Encounter: Payer: Self-pay | Admitting: Physical Therapy

## 2015-09-17 ENCOUNTER — Encounter: Payer: Self-pay | Admitting: Physical Therapy

## 2015-09-27 ENCOUNTER — Encounter: Payer: Self-pay | Admitting: Family Medicine

## 2015-09-27 ENCOUNTER — Ambulatory Visit: Payer: No Typology Code available for payment source | Attending: Family Medicine | Admitting: Family Medicine

## 2015-09-27 VITALS — BP 147/85 | HR 61 | Temp 98.3°F | Resp 20 | Ht 69.0 in | Wt 142.0 lb

## 2015-09-27 DIAGNOSIS — T733XXA Exhaustion due to excessive exertion, initial encounter: Secondary | ICD-10-CM

## 2015-09-27 DIAGNOSIS — R5383 Other fatigue: Secondary | ICD-10-CM | POA: Insufficient documentation

## 2015-09-27 DIAGNOSIS — R634 Abnormal weight loss: Secondary | ICD-10-CM

## 2015-09-27 DIAGNOSIS — Z0000 Encounter for general adult medical examination without abnormal findings: Secondary | ICD-10-CM

## 2015-09-27 DIAGNOSIS — F411 Generalized anxiety disorder: Secondary | ICD-10-CM

## 2015-09-27 DIAGNOSIS — K219 Gastro-esophageal reflux disease without esophagitis: Secondary | ICD-10-CM

## 2015-09-27 LAB — VITAMIN B12: Vitamin B-12: 443 pg/mL (ref 211–911)

## 2015-09-27 LAB — TSH: TSH: 0.499 u[IU]/mL (ref 0.350–4.500)

## 2015-09-27 LAB — POCT GLYCOSYLATED HEMOGLOBIN (HGB A1C): Hemoglobin A1C: 5.8

## 2015-09-27 LAB — GLUCOSE, POCT (MANUAL RESULT ENTRY): POC Glucose: 83 mg/dl (ref 70–99)

## 2015-09-27 MED ORDER — PANTOPRAZOLE SODIUM 40 MG PO TBEC: 40.0000 mg | DELAYED_RELEASE_TABLET | Freq: Every day | ORAL | Status: DC

## 2015-09-27 MED ORDER — ESCITALOPRAM OXALATE 20 MG PO TABS: 20.0000 mg | ORAL_TABLET | Freq: Every day | ORAL | Status: DC

## 2015-09-27 NOTE — Assessment & Plan Note (Signed)
Due to excessive exertion Advised increase rest as possible Checking TSH and vit B12

## 2015-09-27 NOTE — Patient Instructions (Signed)
[name] [name] was seen today for fatigue and weight loss.  Diagnoses and all orders for this visit:  Chronic fatigue -     POCT glycosylated hemoglobin (Hb A1C) -     POCT glucose (manual entry) -     TSH -     Vitamin B12  Gastroesophageal reflux disease, esophagitis presence not specified -     pantoprazole (PROTONIX) 40 MG tablet; Take 1 tablet (40 mg total) by mouth daily. For acid reflux  Generalized anxiety disorder -     escitalopram (LEXAPRO) 20 MG tablet; Take 1 tablet (20 mg total) by mouth daily.  Loss of weight   Increase calories Protein and healthy fats   F/u in 6 months for weight check   Dr. Adrian Blackwater  Generic Shoulder Exercises EXERCISES  RANGE OF MOTION (ROM) AND STRETCHING EXERCISES These exercises may help you when beginning to rehabilitate your injury. Your symptoms may resolve with or without further involvement from your physician, physical therapist or athletic trainer. While completing these exercises, remember:   Restoring tissue flexibility helps normal motion to return to the joints. This allows healthier, less painful movement and activity.  An effective stretch should be held for at least 30 seconds.  A stretch should never be painful. You should only feel a gentle lengthening or release in the stretched tissue. ROM - Pendulum  Bend at the waist so that your right / left arm falls away from your body. Support yourself with your opposite hand on a solid surface, such as a table or a countertop.  Your right / left arm should be perpendicular to the ground. If it is not perpendicular, you need to lean over farther. Relax the muscles in your right / left arm and shoulder as much as possible.  Gently sway your hips and trunk so they move your right / left arm without any use of your right / left shoulder muscles.  Progress your movements so that your right / left arm moves side to side, then forward and backward, and finally, both clockwise and  counterclockwise.  Complete __________ repetitions in each direction. Many people use this exercise to relieve discomfort in their shoulder as well as to gain range of motion. Repeat __________ times. Complete this exercise __________ times per day. STRETCH - Flexion, Standing  Stand with good posture. With an underhand grip on your right / left hand and an overhand grip on the opposite hand, grasp a broomstick or cane so that your hands are a little more than shoulder-width apart.  Keeping your right / left elbow straight and shoulder muscles relaxed, push the stick with your opposite hand to raise your right / left arm in front of your body and then overhead. Raise your arm until you feel a stretch in your right / left shoulder, but before you have increased shoulder pain.  Try to avoid shrugging your right / left shoulder as your arm rises by keeping your shoulder blade tucked down and toward your mid-back spine. Hold __________ seconds.  Slowly return to the starting position. Repeat __________ times. Complete this exercise __________ times per day. STRETCH - Internal Rotation  Place your right / left hand behind your back, palm-up.  Throw a towel or belt over your opposite shoulder. Grasp the towel/belt with your right / left hand.  While keeping an upright posture, gently pull up on the towel/belt until you feel a stretch in the front of your right / left shoulder.  Avoid shrugging your right /  left shoulder as your arm rises by keeping your shoulder blade tucked down and toward your mid-back spine.  Hold __________. Release the stretch by lowering your opposite hand. Repeat __________ times. Complete this exercise __________ times per day. STRETCH - External Rotation and Abduction  Stagger your stance through a doorframe. It does not matter which foot is forward.  As instructed by your physician, physical therapist or athletic trainer, place your hands:  And forearms above your  head and on the door frame.  And forearms at head-height and on the door frame.  At elbow-height and on the door frame.  Keeping your head and chest upright and your stomach muscles tight to prevent over-extending your low-back, slowly shift your weight onto your front foot until you feel a stretch across your chest and/or in the front of your shoulders.  Hold __________ seconds. Shift your weight to your back foot to release the stretch. Repeat __________ times. Complete this stretch __________ times per day.  STRENGTHENING EXERCISES  These exercises may help you when beginning to rehabilitate your injury. They may resolve your symptoms with or without further involvement from your physician, physical therapist or athletic trainer. While completing these exercises, remember:   Muscles can gain both the endurance and the strength needed for everyday activities through controlled exercises.  Complete these exercises as instructed by your physician, physical therapist or athletic trainer. Progress the resistance and repetitions only as guided.  You may experience muscle soreness or fatigue, but the pain or discomfort you are trying to eliminate should never worsen during these exercises. If this pain does worsen, stop and make certain you are following the directions exactly. If the pain is still present after adjustments, discontinue the exercise until you can discuss the trouble with your clinician.  If advised by your physician, during your recovery, avoid activity or exercises which involve actions that place your right / left hand or elbow above your head or behind your back or head. These positions stress the tissues which are trying to heal. STRENGTH - Scapular Depression and Adduction  With good posture, sit on a firm chair. Supported your arms in front of you with pillows, arm rests or a table top. Have your elbows in line with the sides of your body.  Gently draw your shoulder blades  down and toward your mid-back spine. Gradually increase the tension without tensing the muscles along the top of your shoulders and the back of your neck.  Hold for __________ seconds. Slowly release the tension and relax your muscles completely before completing the next repetition.  After you have practiced this exercise, remove the arm support and complete it in standing as well as sitting. Repeat __________ times. Complete this exercise __________ times per day.  STRENGTH - External Rotators  Secure a rubber exercise band/tubing to a fixed object so that it is at the same height as your right / left elbow when you are standing or sitting on a firm surface.  Stand or sit so that the secured exercise band/tubing is at your side that is not injured.  Bend your elbow 90 degrees. Place a folded towel or small pillow under your right / left arm so that your elbow is a few inches away from your side.  Keeping the tension on the exercise band/tubing, pull it away from your body, as if pivoting on your elbow. Be sure to keep your body steady so that the movement is only coming from your shoulder rotating.  Hold __________ seconds. Release the tension in a controlled manner as you return to the starting position. Repeat __________ times. Complete this exercise __________ times per day.  STRENGTH - Supraspinatus  Stand or sit with good posture. Grasp a __________ weight or an exercise band/tubing so that your hand is "thumbs-up," like when you shake hands.  Slowly lift your right / left hand from your thigh into the air, traveling about 30 degrees from straight out at your side. Lift your hand to shoulder height or as far as you can without increasing any shoulder pain. Initially, many people do not lift their hands above shoulder height.  Avoid shrugging your right / left shoulder as your arm rises by keeping your shoulder blade tucked down and toward your mid-back spine.  Hold for __________  seconds. Control the descent of your hand as you slowly return to your starting position. Repeat __________ times. Complete this exercise __________ times per day.  STRENGTH - Shoulder Extensors  Secure a rubber exercise band/tubing so that it is at the height of your shoulders when you are either standing or sitting on a firm arm-less chair.  With a thumbs-up grip, grasp an end of the band/tubing in each hand. Straighten your elbows and lift your hands straight in front of you at shoulder height. Step back away from the secured end of band/tubing until it becomes tense.  Squeezing your shoulder blades together, pull your hands down to the sides of your thighs. Do not allow your hands to go behind you.  Hold for __________ seconds. Slowly ease the tension on the band/tubing as you reverse the directions and return to the starting position. Repeat __________ times. Complete this exercise __________ times per day.  STRENGTH - Scapular Retractors  Secure a rubber exercise band/tubing so that it is at the height of your shoulders when you are either standing or sitting on a firm arm-less chair.  With a palm-down grip, grasp an end of the band/tubing in each hand. Straighten your elbows and lift your hands straight in front of you at shoulder height. Step back away from the secured end of band/tubing until it becomes tense.  Squeezing your shoulder blades together, draw your elbows back as you bend them. Keep your upper arm lifted away from your body throughout the exercise.  Hold __________ seconds. Slowly ease the tension on the band/tubing as you reverse the directions and return to the starting position. Repeat __________ times. Complete this exercise __________ times per day. STRENGTH - Scapular Depressors  Find a sturdy chair without wheels, such as a from a dining room table.  Keeping your feet on the floor, lift your bottom from the seat and lock your elbows.  Keeping your elbows  straight, allow gravity to pull your body weight down. Your shoulders will rise toward your ears.  Raise your body against gravity by drawing your shoulder blades down your back, shortening the distance between your shoulders and ears. Although your feet should always maintain contact with the floor, your feet should progressively support less body weight as you get stronger.  Hold __________ seconds. In a controlled and slow manner, lower your body weight to begin the next repetition. Repeat __________ times. Complete this exercise __________ times per day.    This information is not intended to replace advice given to you by your health care provider. Make sure you discuss any questions you have with your health care provider.   Document Released: 09/27/2005 Document Revised: 12/04/2014 Document  Reviewed: 02/25/2009 Elsevier Interactive Patient Education [employer].

## 2015-09-27 NOTE — Assessment & Plan Note (Signed)
A: patient with calorie expenditure that exceeds intake.  P: Increase intake of calories

## 2015-09-27 NOTE — Progress Notes (Signed)
Subjective:  Patient ID: [name] [name], male    DOB: 04/19/1961  Age: 54 y.o. MRN: [id number]  CC: Fatigue and Weight Loss   HPI [name] [name] presents for   1. Fatigue: x 3 weeks. Sleeping 5 hrs a night. Working from [employer]. Denies depression.   2. Weight loss: walking 3 miles a day as part of work commute. Denies decreased appetite. Denies GI upset.  He had a normal screening colonoscopy in 01/2015. No fever or chills.   3. Med refills: lexapro and protonix.   Social History  Substance Use Topics  . Smoking status: Former Smoker    Types: Cigars  . Smokeless tobacco: Never Used     Comment: smokes occasional black &mild  . Alcohol Use: No     Comment: Patient previously drank at minimum two 40oz daily     Outpatient Prescriptions Prior to Visit  Medication Sig Dispense Refill  . escitalopram (LEXAPRO) 20 MG tablet Take 1 tablet (20 mg total) by mouth daily. 30 tablet 5  . hydrocortisone 2.5 % cream Apply topically 2 (two) times daily. Apply to face, stop of skin lightens 30 g 2  . ibuprofen (ADVIL,MOTRIN) 800 MG tablet Take 1 tablet (800 mg total) by mouth every 8 (eight) hours as needed. 21 tablet 0  . ketoconazole (NIZORAL) 2 % shampoo Apply 1 application topically 2 (two) times a week. Apply to scalp 120 mL 3  . naproxen (NAPROSYN) 500 MG tablet Take 1 tablet (500 mg total) by mouth 2 (two) times daily with a meal. 30 tablet 0  . pantoprazole (PROTONIX) 40 MG tablet Take 1 tablet (40 mg total) by mouth daily. For acid reflux 30 tablet 2   No facility-administered medications prior to visit.    ROS Review of Systems  Constitutional: Positive for fatigue and unexpected weight change. Negative for fever and chills.  Eyes: Negative for visual disturbance.  Respiratory: Negative for cough and shortness of breath.   Cardiovascular: Negative for chest pain, palpitations and leg swelling.  Gastrointestinal: Negative for nausea, vomiting, abdominal pain, diarrhea,  constipation and blood in stool.  Endocrine: Negative for polydipsia, polyphagia and polyuria.  Musculoskeletal: Negative for myalgias, back pain, arthralgias, gait problem and neck pain.  Skin: Negative for rash.  Allergic/Immunologic: Negative for immunocompromised state.  Hematological: Negative for adenopathy. Does not bruise/bleed easily.  Psychiatric/Behavioral: Negative for suicidal ideas, sleep disturbance and dysphoric mood. The patient is not nervous/anxious.     Objective:  BP 147/85 mmHg  Pulse 61  Temp(Src) 98.3 F (36.8 C) (Oral)  Resp 20  Ht 5\' 9"  (1.753 m)  Wt 142 lb (64.411 kg)  BMI 20.96 kg/m2  SpO2 100%  BP/Weight 09/27/2015 09/14/2015 5/40/0867  Systolic BP 619 509 326  Diastolic BP 85 64 82  Wt. (Lbs) 142 136 151  BMI 20.96 20.07 22.29  Some encounter information is confidential and restricted. Go to Review Flowsheets activity to see all data.    Physical Exam  Constitutional: He appears well-developed and well-nourished. No distress.  HENT:  Head: Normocephalic and atraumatic.  Neck: Normal range of motion. Neck supple.  Cardiovascular: Normal rate, regular rhythm, normal heart sounds and intact distal pulses.   Pulmonary/Chest: Effort normal and breath sounds normal.  Musculoskeletal: He exhibits no edema.  Neurological: He is alert.  Skin: Skin is warm and dry. No rash noted. No erythema.  Psychiatric: He has a normal mood and affect.   Lab Results  Component Value Date   HGBA1C  5.80 09/27/2015   CBG 83   Assessment & Plan:   Problem List Items Addressed This Visit    Fatigue - Primary    Due to excessive exertion Advised increase rest as possible Checking TSH and vit B12      Relevant Orders   POCT glycosylated hemoglobin (Hb A1C) (Completed)   POCT glucose (manual entry) (Completed)   TSH   Vitamin B12   Generalized anxiety disorder (Chronic)   Relevant Medications   escitalopram (LEXAPRO) 20 MG tablet   GERD (gastroesophageal  reflux disease)   Relevant Medications   pantoprazole (PROTONIX) 40 MG tablet   Loss of weight    A: patient with calorie expenditure that exceeds intake.  P: Increase intake of calories        Other Visit Diagnoses    Healthcare maintenance        Relevant Orders    Flu Vaccine QUAD 36+ mos IM (Completed)       No orders of the defined types were placed in this encounter.    Follow-up: No Follow-up on file.   [name] Nearing MD

## 2015-09-29 ENCOUNTER — Telehealth: Payer: Self-pay | Admitting: *Deleted

## 2015-09-29 NOTE — Telephone Encounter (Signed)
-----   Message from Boykin Nearing, MD sent at 09/27/2015  4:15 PM EDT ----- tsh and vit b12 normal

## 2015-09-29 NOTE — Telephone Encounter (Signed)
LVM to return call.

## 2015-09-30 NOTE — Telephone Encounter (Signed)
Date of birth verified by pt Lab results given-Normal TSH and Vit B12 Pt verbalized understanding

## 2015-11-25 ENCOUNTER — Ambulatory Visit: Payer: Self-pay | Admitting: Family Medicine

## 2015-12-14 ENCOUNTER — Ambulatory Visit: Payer: Self-pay

## 2015-12-14 MED FILL — ?ESCITALOPRAM 20 MG TABLET: 20 | 30 days supply | Qty: 30 | Fill #3

## 2015-12-14 MED FILL — ?PANTOPRAZOLE SOD DR 40MG: 40 MG | 30 days supply | Qty: 30 | Fill #2

## 2015-12-17 ENCOUNTER — Emergency Department (INDEPENDENT_AMBULATORY_CARE_PROVIDER_SITE_OTHER)
Admission: EM | Admit: 2015-12-17 | Discharge: 2015-12-17 | Disposition: A | Discharge status: Home / Self Care | Payer: Self-pay | Source: Home / Self Care | Attending: Family Medicine | Admitting: Family Medicine

## 2015-12-17 ENCOUNTER — Encounter (HOSPITAL_COMMUNITY): Payer: Self-pay | Admitting: Emergency Medicine

## 2015-12-17 DIAGNOSIS — R0989 Other specified symptoms and signs involving the circulatory and respiratory systems: Secondary | ICD-10-CM

## 2015-12-17 DIAGNOSIS — J3489 Other specified disorders of nose and nasal sinuses: Secondary | ICD-10-CM

## 2015-12-17 MED ORDER — FLUTICASONE PROPIONATE 50 MCG/ACT NA SUSP: 2.0000 | Freq: Every day | NASAL | Status: DC

## 2015-12-17 NOTE — Discharge Instructions (Signed)
Chlorpheniramine; Pseudoephedrine tablets What is this medicine? CHLORPHENIRAMINE; PSEUDOEPHEDRINE (klor fen IR a meen; soo doe e FED rin) is a combination of an antihistamine and a decongestant. It is used to treat the symptoms of allergy and colds. This medicine will not treat an infection. This medicine may be used for other purposes; ask your health care provider or pharmacist if you have questions. What should I tell my health care provider before I take this medicine? They need to know if you have any of these conditions: -asthma -diabetes -difficulty passing urine -glaucoma -heart disease -stomach ulcer -thyroid disease -an unusual or allergic reaction to chlorpheniramine, pseudoephedrine, other medicines, foods, dyes, or preservatives -pregnant or trying to get pregnant -breast-feeding How should I use this medicine? Take this medicine by mouth with a glass of water. Follow the directions on the prescription label. If this medicine upsets your stomach, take with food or milk. Take your doses at regular intervals. Do not take your medicine more often than directed. Talk to your pediatrician regarding the use of this medicine in children. While this drug may be prescribed for selected conditions, precautions do apply. Patients over 54 years old may have a stronger reaction and need a smaller dose. Overdosage: If you think you have taken too much of this medicine contact a poison control center or emergency room at once. NOTE: This medicine is only for you. Do not share this medicine with others. What if I miss a dose? If you miss a dose, take it as soon as you can. If it is almost time for your next dose, take only that dose. Do not take double or extra doses. What may interact with this medicine? Do not take this medicine with any of the following medications: -MAOIs like Carbex, Eldepryl, Marplan, Nardil, and Parnate This medicine may also interact with the following  medications: -alcohol -barbiturates like phenobarbital -medicines for blood pressure -medicines for depression, anxiety, or psychotic disturbances -medicines for sleep -other medicines for cold, cough or allergy -stimulant medicines for attention disorders, weight loss, or to stay awake This list may not describe all possible interactions. Give your health care provider a list of all the medicines, herbs, non-prescription drugs, or dietary supplements you use. Also tell them if you smoke, drink alcohol, or use illegal drugs. Some items may interact with your medicine. What should I watch for while using this medicine? Tell your doctor if your symptoms do not improve or if they get worse. You may get drowsy or dizzy. Do not drive, use machinery, or do anything that needs mental alertness until you know how this medicine affects you. Do not stand or sit up quickly, especially if you are an older patient. This reduces the risk of dizzy or fainting spells. Alcohol may interfere with the effect of this medicine. Avoid alcoholic drinks. What side effects may I notice from receiving this medicine? Side effects that you should report to your doctor or health care professional as soon as possible: -allergic reactions like skin rash, itching or hives, swelling of the face, lips, or tongue -breathing trouble -blurred vision -difficulty passing urine -fast, irregular heartbeat -fear, anxiety, restless, tremor -hallucinations -high or low blood pressure -seizures -unusually weak or tired Side effects that usually do not require medical attention (report to your doctor or health care professional if they continue or are bothersome): -dry mouth, nose, throat -headache -loss of appetite -more sensitive to sunlight -passing urine more often -stomach upset, nausea -trouble sleeping This list may not  describe all possible side effects. Call your doctor for medical advice about side effects. You may  report side effects to FDA at 1-800-FDA-1088. Where should I keep my medicine? Keep out of the reach of children. Store at room temperature between 15 and 30 degrees C (59 and 86 degrees F). Protect from light. Throw away any unused medicine after the expiration date. NOTE: This sheet is a summary. It may not cover all possible information. If you have questions about this medicine, talk to your doctor, pharmacist, or health care provider.    2016, Elsevier/Gold Standard. (2008-02-26 17:35:31)

## 2015-12-17 NOTE — ED Notes (Signed)
C/o cold sx onset x1 week associated w/runny nose, congestion, HA, and feeling light headed A&O x4... No acute distress.

## 2015-12-17 NOTE — ED Provider Notes (Signed)
CSN: [id number]     Arrival date & time 12/17/15  1824 History   First MD Initiated Contact with Patient 12/17/15 1910     Chief Complaint  Patient presents with  . URI   (Consider location/radiation/quality/duration/timing/severity/associated sxs/prior Treatment) HPI Runny nose and itchy eyes for the last 4 days. States it is runny noses clear drainage. Recent URI symptoms. 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 No fever at home. No cough. Past Medical History  Diagnosis Date  . 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 Reflux Dx 2007  . Pancreatitis Dx 2014  . Anxiety     About 55 years of age  . Depression     At 55 years of age  . GERD (gastroesophageal reflux disease) Dx 2007  . Carpal tunnel syndrome of right wrist Dx 2015  . Alcohol abuse     Last Drink on Sep 2015  . Pneumonia 11/2014  . Hepatitis, alcoholic, acute 123456  . Hyperlipidemia     borderline, diet controlled  . 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 Knee arthroscopy    . Carpel tunnel release Right 07/28/2014     done in [place]   . Elbow surgery    . Knee surgery Left     15 years ago   Family History  Problem Relation Age of Onset  . Diabetes Father   . Hypertension Father   . Hypertension Paternal Grandfather   . Alcohol abuse Paternal Grandfather   . Alcohol abuse Maternal Grandfather   . Alcohol abuse Maternal Grandmother   . Alcohol abuse Paternal Grandmother   . Colon cancer Neg Hx   . Rectal cancer Neg Hx   . Stomach cancer Neg Hx    Social History  Substance Use Topics  . Smoking status: Former Smoker    Types: Cigars  . Smokeless tobacco: Never Used     Comment: smokes occasional black &mild  . Alcohol Use: No     Comment: Patient previously drank at minimum two 40oz daily     Review of Systems ROS +'ve nose  Denies: HEADACHE, NAUSEA, ABDOMINAL PAIN, CHEST PAIN, CONGESTION, DYSURIA, SHORTNESS OF  BREATH  Allergies  Oxycodone  Home Medications   Prior to Admission medications   Medication Sig Start Date End Date Taking? Authorizing Provider  escitalopram (LEXAPRO) 20 MG tablet Take 1 tablet (20 mg total) by mouth daily. 09/27/15   Josalyn Funches, MD  fluticasone (FLONASE) 50 MCG/ACT nasal spray Place 2 sprays into both nostrils daily. 12/17/15   Konrad Felix, PA  hydrocortisone 2.5 % cream Apply topically 2 (two) times daily. Apply to face, stop of skin lightens 07/08/15   Boykin Nearing, MD  ibuprofen (ADVIL,MOTRIN) 800 MG tablet Take 1 tablet (800 mg total) by mouth every 8 (eight) hours as needed. 05/15/15   Dalia Heading, PA-C  ketoconazole (NIZORAL) 2 % shampoo Apply 1 application topically 2 (two) times a week. Apply to scalp 07/08/15   Boykin Nearing, MD  naproxen (NAPROSYN) 500 MG tablet Take 1 tablet (500 mg total) by mouth 2 (two) times daily with a meal. 09/14/15   Posey Boyer, MD  pantoprazole (PROTONIX) 40 MG tablet Take 1 tablet (40 mg total) by mouth daily. For acid reflux 09/27/15   Boykin Nearing, MD   Meds Ordered and Administered this Visit  Medications - No data to display  BP 118/83 mmHg  Pulse  64  Temp(Src) 98.1 F (36.7 C) (Oral)  Resp 18  SpO2 100% No data found.   Physical Exam  Constitutional: He is oriented to person, place, and time. He appears well-developed and well-nourished.  HENT:  Head: Normocephalic and atraumatic.  Right Ear: External ear normal.  Left Ear: External ear normal.  Nose: Nose normal.  Mouth/Throat: Oropharynx is clear and moist.  Eyes: Conjunctivae are normal.  Neck: Neck supple.  Cardiovascular: Normal rate.   Pulmonary/Chest: Effort normal and breath sounds normal.  Neurological: He is alert and oriented to person, place, and time.  Skin: Skin is warm and dry.  Psychiatric: He has a normal mood and affect. His behavior is normal. Judgment and thought content normal.    ED Course  Procedures (including  critical care time)  Labs Review Labs Reviewed - No data to display  Imaging Review No results found.   Visual Acuity Review  Right Eye Distance:   Left Eye Distance:   Bilateral Distance:    Right Eye Near:   Left Eye Near:    Bilateral Near:         MDM   1. Runny nose    Patient is advised to continue home symptomatic treatment. Prescription for Flonase, Claritin  sent pharmacy patient has indicated. Patient is advised that if there are new or worsening symptoms or attend the emergency department, or contact primary care provider. Instructions of care provided discharged home in stable condition.  THIS NOTE WAS GENERATED USING A VOICE RECOGNITION SOFTWARE PROGRAM. ALL REASONABLE EFFORTS  WERE MADE TO PROOFREAD THIS DOCUMENT FOR ACCURACY.     Konrad Felix, [place] 12/17/15 1958

## 2015-12-20 MED FILL — FLUTICASONE PROP 50 MCG SPR: 50 | 30 days supply | Qty: 16 | Fill #0

## 2015-12-22 ENCOUNTER — Ambulatory Visit: Payer: Self-pay | Attending: Family Medicine

## 2015-12-27 ENCOUNTER — Encounter (HOSPITAL_COMMUNITY): Payer: Self-pay | Admitting: Emergency Medicine

## 2015-12-27 ENCOUNTER — Emergency Department (HOSPITAL_COMMUNITY): Payer: Self-pay

## 2015-12-27 ENCOUNTER — Emergency Department (HOSPITAL_COMMUNITY)
Admission: EM | Admit: 2015-12-27 | Discharge: 2015-12-28 | Disposition: A | Discharge status: Home / Self Care | Payer: Self-pay | Attending: Emergency Medicine | Admitting: Emergency Medicine

## 2015-12-27 DIAGNOSIS — K219 Gastro-esophageal reflux disease without esophagitis: Secondary | ICD-10-CM | POA: Insufficient documentation

## 2015-12-27 DIAGNOSIS — Z8669 Personal history of other diseases of the nervous system and sense organs: Secondary | ICD-10-CM | POA: Insufficient documentation

## 2015-12-27 DIAGNOSIS — Z791 Long term (current) use of non-steroidal anti-inflammatories (NSAID): Secondary | ICD-10-CM | POA: Insufficient documentation

## 2015-12-27 DIAGNOSIS — R0989 Other specified symptoms and signs involving the circulatory and respiratory systems: Secondary | ICD-10-CM

## 2015-12-27 DIAGNOSIS — R058 Other specified cough: Secondary | ICD-10-CM

## 2015-12-27 DIAGNOSIS — Z8701 Personal history of pneumonia (recurrent): Secondary | ICD-10-CM | POA: Insufficient documentation

## 2015-12-27 DIAGNOSIS — Z87891 Personal history of nicotine dependence: Secondary | ICD-10-CM | POA: Insufficient documentation

## 2015-12-27 DIAGNOSIS — F419 Anxiety disorder, unspecified: Secondary | ICD-10-CM | POA: Insufficient documentation

## 2015-12-27 DIAGNOSIS — Z7952 Long term (current) use of systemic steroids: Secondary | ICD-10-CM | POA: Insufficient documentation

## 2015-12-27 DIAGNOSIS — R05 Cough: Secondary | ICD-10-CM

## 2015-12-27 DIAGNOSIS — R053 Chronic cough: Secondary | ICD-10-CM

## 2015-12-27 DIAGNOSIS — F329 Major depressive disorder, single episode, unspecified: Secondary | ICD-10-CM | POA: Insufficient documentation

## 2015-12-27 DIAGNOSIS — J0110 Acute frontal sinusitis, unspecified: Secondary | ICD-10-CM | POA: Insufficient documentation

## 2015-12-27 DIAGNOSIS — Z7951 Long term (current) use of inhaled steroids: Secondary | ICD-10-CM | POA: Insufficient documentation

## 2015-12-27 DIAGNOSIS — Z79899 Other long term (current) drug therapy: Secondary | ICD-10-CM | POA: Insufficient documentation

## 2015-12-27 NOTE — ED Notes (Addendum)
Pt. reports persistent/chronic productive cough with chest congestion , " burning eyes"  and nasal congestion/runny nose for > 3 weeks , seen at an urgent care prescribed with medications with no improvement . Denies fever or chills.

## 2015-12-27 NOTE — ED Provider Notes (Signed)
CSN: [id number]     Arrival date & time 12/27/15  2210 History  By signing my name below, I, Soijett Blue, attest that this documentation has been prepared under the direction and in the presence of Josephina Gip, PA-C Electronically Signed: Soijett Blue, ED Scribe. 12/27/2015. 11:47 PM.   Chief Complaint  Patient presents with  . Cough  . Eye Problem   The history is provided by the patient. No language interpreter was used.   [name] [name] is a 55 y.o. male with a PMHx of sarcoidosis, pneumonia, who presents to the Emergency Department complaining of persistent chronic productive cough x yellow sputum onset 15 days. He notes that he came into the ED due to his PMHx of sarcoidosis and he wanted to ensure that he wasn't having an exacerbation of this. He states that he is having associated symptoms of "burning" eyes, watery eyes, sinus HA x 2 weeks, rhinorrhea, nasal congestion x 2 weeks, sore throat due to cough, and subjective fever. Denies purulent nasal discharge or eye discharge. He states that his sinus headache feels like a pressure pushing out behind his forehead. His throat is only painful when he is coughing. He states that he has tried inhaler and mucinex for the relief for his symptoms with mild relief. He denies chills, dizziness, syncope, earache, ear discharge, eye redness, neck pain, chest pain, SOB, abdominal pain, nausea, vomiting, diarrhea, color change, rash, wound, and any other symptoms.  Per pt chart review: Pt was seen on 12/17/2015 at Urgent Care for URI like symptoms. Pt was informed to treat symptomatically and Rx flonase and claritin.    Past Medical History  Diagnosis Date  . 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 Reflux Dx 2007  . Pancreatitis Dx 2014  . Anxiety     About 55 years of age  . Depression     At 55 years of age  . GERD (gastroesophageal reflux disease) Dx 2007  . Carpal tunnel syndrome of right wrist Dx 2015  . Alcohol abuse     Last Drink on Sep 2015   . Pneumonia 11/2014  . Hepatitis, alcoholic, acute 123456  . Hyperlipidemia     borderline, diet controlled  . 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 Knee arthroscopy    . Carpel tunnel release Right 07/28/2014     done in [place]   . Elbow surgery    . Knee surgery Left     15 years ago   Family History  Problem Relation Age of Onset  . Diabetes Father   . Hypertension Father   . Hypertension Paternal Grandfather   . Alcohol abuse Paternal Grandfather   . Alcohol abuse Maternal Grandfather   . Alcohol abuse Maternal Grandmother   . Alcohol abuse Paternal Grandmother   . Colon cancer Neg Hx   . Rectal cancer Neg Hx   . Stomach cancer Neg Hx    Social History  Substance Use Topics  . Smoking status: Former Smoker    Types: Cigars  . Smokeless tobacco: Never Used     Comment: smokes occasional black &mild  . Alcohol Use: No    Review of Systems  Constitutional: Positive for fever (subjective). Negative for chills.  HENT: Positive for congestion, rhinorrhea (clear), sinus pressure and sore throat (due to cough).   Eyes: Positive for discharge (watering). Negative for pain and redness.       Watery eyes with  a burning sensation  Respiratory: Positive for cough. Negative for shortness of breath.   Cardiovascular: Negative for chest pain.  Gastrointestinal: Negative for nausea, vomiting and abdominal pain.  Musculoskeletal: Negative for myalgias and arthralgias.  Skin: Negative for rash.  Neurological: Negative for dizziness, syncope and headaches.  All other systems reviewed and are negative.     Allergies  Oxycodone  Home Medications   Prior to Admission medications   Medication Sig Start Date End Date Taking? Authorizing Provider  amoxicillin-clavulanate (AUGMENTIN) 875-125 MG tablet Take 1 tablet by mouth 2 (two) times daily. 12/28/15   Stevi Barrett, PA-C  escitalopram (LEXAPRO) 20 MG tablet  Take 1 tablet (20 mg total) by mouth daily. 09/27/15   Josalyn Funches, MD  fluticasone (FLONASE) 50 MCG/ACT nasal spray Place 2 sprays into both nostrils daily. 12/17/15   Konrad Felix, PA  fluticasone (FLONASE) 50 MCG/ACT nasal spray Place 2 sprays into both nostrils daily. 12/17/15   Konrad Felix, PA  hydrocortisone 2.5 % cream Apply topically 2 (two) times daily. Apply to face, stop of skin lightens 07/08/15   Boykin Nearing, MD  ibuprofen (ADVIL,MOTRIN) 800 MG tablet Take 1 tablet (800 mg total) by mouth every 8 (eight) hours as needed. 05/15/15   Dalia Heading, PA-C  ketoconazole (NIZORAL) 2 % shampoo Apply 1 application topically 2 (two) times a week. Apply to scalp 07/08/15   Boykin Nearing, MD  naproxen (NAPROSYN) 500 MG tablet Take 1 tablet (500 mg total) by mouth 2 (two) times daily with a meal. 09/14/15   Posey Boyer, MD  pantoprazole (PROTONIX) 40 MG tablet Take 1 tablet (40 mg total) by mouth daily. For acid reflux 09/27/15   Josalyn Funches, MD   BP 144/78 mmHg  Pulse 87  Temp(Src) 98.2 F (36.8 C) (Oral)  Resp 18  SpO2 99% Physical Exam  Constitutional: He appears well-developed and well-nourished. No distress.  HENT:  Head: Normocephalic and atraumatic.  Right Ear: Tympanic membrane and ear canal normal.  Left Ear: Tympanic membrane and ear canal normal.  Nose: Mucosal edema present. No rhinorrhea. Right sinus exhibits frontal sinus tenderness. Right sinus exhibits no maxillary sinus tenderness. Left sinus exhibits frontal sinus tenderness. Left sinus exhibits no maxillary sinus tenderness.  Mouth/Throat: Uvula is midline, oropharynx is clear and moist and mucous membranes are normal. Mucous membranes are not dry. No uvula swelling. No oropharyngeal exudate, posterior oropharyngeal edema or posterior oropharyngeal erythema.  Eyes: Conjunctivae are normal. Right eye exhibits no discharge. Left eye exhibits no discharge. No scleral icterus.  No conjunctival  injection. No purulent discharge. EOM intact without pain.   Neck: Normal range of motion. Neck supple.  Cardiovascular: Normal rate, regular rhythm and normal heart sounds.   Pulmonary/Chest: Effort normal and breath sounds normal. No respiratory distress. He has no wheezes.  Abdominal: Soft. There is no tenderness. There is no rebound and no guarding.  Musculoskeletal: Normal range of motion.  Lymphadenopathy:    He has no cervical adenopathy.  Neurological: He is alert. Coordination normal.  Skin: Skin is warm and dry. No rash noted.  Psychiatric: He has a normal mood and affect. His behavior is normal.  Nursing note and vitals reviewed.   ED Course  Procedures (including critical care time) DIAGNOSTIC STUDIES: Oxygen Saturation is 99% on RA, nl by my interpretation.    COORDINATION OF CARE: 11:47 PM Discussed treatment plan with pt at bedside which includes CXR, abx Rx, and continue mucinex PRN and pt agreed to plan.  Labs Review Labs Reviewed - No data to display  Imaging Review Dg Chest 2 View  12/27/2015  CLINICAL DATA:  Productive persistent cough and chest congestion for 3 weeks. History of sarcoidosis. EXAM: CHEST  2 VIEW COMPARISON:  01/22/2015.  CT 09/04/2014 FINDINGS: Extensive upper lobe predominant scarring with hilar retraction consistent with history of sarcoidosis. The degree is unchanged from prior exam. The heart size is normal. No superimposed consolidation. No pleural effusion. No pneumothorax. Osseous structures are stable. IMPRESSION: Upper lobe predominant scarring with hilar retraction consistent with history of sarcoidosis. No superimposed acute process. Electronically Signed   By: Jeb Levering M.D.   On: 12/27/2015 23:34   I have personally reviewed and evaluated these images as part of my medical decision-making.   EKG Interpretation None      MDM   Final diagnoses:  Acute frontal sinusitis, recurrence not specified   Patient presenting with  symptoms of sinusitis including front sinus headache, congestion, rhinorrhea, and cough x 2 weeks. Severe symptoms have been present for greater than 10 days with purulent nasal discharge and frontal sinus pain. Concern for acute bacterial rhinosinusitis. Patient discharged with Augmentin.  Instructions given for warm saline nasal wash and recommendations for follow-up with primary care physician. Return precautions given in discharge paperwork and discussed with pt at bedside. Pt stable for discharge   I personally performed the services described in this documentation, which was scribed in my presence. The recorded information has been reviewed and is accurate.    Josephina Gip, PA-C 12/28/15 [id number]  Merryl Hacker, MD 12/28/15 [phone]

## 2015-12-28 MED ORDER — AMOXICILLIN-POT CLAVULANATE 875-125 MG PO TABS: 1.0000 | ORAL_TABLET | Freq: Two times a day (BID) | ORAL | Status: DC

## 2015-12-28 MED ORDER — ACETAMINOPHEN 325 MG PO TABS
650.0000 mg | ORAL_TABLET | Freq: Once | ORAL | Status: AC
Start: 2015-12-28 — End: 2015-12-28
  Administered 2015-12-28: 650 mg via ORAL
  Filled 2015-12-28: qty 2

## 2015-12-28 NOTE — ED Notes (Signed)
Pt stable, ambulatory, states understanding of discharge instructions 

## 2015-12-28 NOTE — Discharge Instructions (Signed)

## 2015-12-30 MED FILL — AMOX-CLAV 875-125 MG TABLET: 875-125 | 5 days supply | Qty: 10 | Fill #0

## 2016-01-11 ENCOUNTER — Ambulatory Visit: Payer: Self-pay | Attending: Family Medicine | Admitting: Family Medicine

## 2016-01-11 ENCOUNTER — Encounter: Payer: Self-pay | Admitting: Family Medicine

## 2016-01-11 VITALS — BP 114/76 | HR 64 | Temp 98.2°F | Resp 16 | Ht 69.0 in | Wt 138.0 lb

## 2016-01-11 DIAGNOSIS — R634 Abnormal weight loss: Secondary | ICD-10-CM | POA: Insufficient documentation

## 2016-01-11 DIAGNOSIS — R05 Cough: Secondary | ICD-10-CM | POA: Insufficient documentation

## 2016-01-11 DIAGNOSIS — Z87891 Personal history of nicotine dependence: Secondary | ICD-10-CM | POA: Insufficient documentation

## 2016-01-11 DIAGNOSIS — M25561 Pain in right knee: Secondary | ICD-10-CM | POA: Insufficient documentation

## 2016-01-11 DIAGNOSIS — G8929 Other chronic pain: Secondary | ICD-10-CM | POA: Insufficient documentation

## 2016-01-11 DIAGNOSIS — L720 Epidermal cyst: Secondary | ICD-10-CM | POA: Insufficient documentation

## 2016-01-11 DIAGNOSIS — L219 Seborrheic dermatitis, unspecified: Secondary | ICD-10-CM | POA: Insufficient documentation

## 2016-01-11 DIAGNOSIS — Z79899 Other long term (current) drug therapy: Secondary | ICD-10-CM | POA: Insufficient documentation

## 2016-01-11 DIAGNOSIS — Z9889 Other specified postprocedural states: Secondary | ICD-10-CM | POA: Insufficient documentation

## 2016-01-11 DIAGNOSIS — M25562 Pain in left knee: Secondary | ICD-10-CM | POA: Insufficient documentation

## 2016-01-11 DIAGNOSIS — J302 Other seasonal allergic rhinitis: Secondary | ICD-10-CM

## 2016-01-11 NOTE — Patient Instructions (Addendum)
[name] [name] was seen today for knee pain.  Diagnoses and all orders for this visit:  Bilateral knee pain -     DG Knee Bilateral Standing AP; Future  Seborrhea -     Ambulatory referral to Dermatology  Epidermal cyst -     Ambulatory referral to Dermatology   Use  nizoral shampoo on face and scalp  Apply sunscreen moisturizer to face to help even skin tone  F/u on 01/21/16 at 12:00 PM for bilateral knee injections   Dr. Adrian Blackwater   High-Protein and High-Calorie Diet Eating high-protein and high-calorie foods can help you to gain weight, heal after an injury, and recover after an illness or surgery.  WHAT IS MY PLAN? The specific amount of daily protein and calories you need depends on:  Your body weight.  The reason this diet is recommended for you. Generally, a high-protein, high-calorie diet involves:   Eating 250-500 extra calories each day.  Making sure that 10-35% of your daily calories come from protein. Talk to your health care provider about how much protein and how many calories you need each day. Follow the diet as directed by your health care provider.  WHAT DO I NEED TO KNOW ABOUT THIS DIET?  Ask your health care provider if you should take a nutritional supplement.   Try to eat six small meals each day instead of three large meals.   Eat a balanced diet, including one food that is high in protein at each meal.  Keep nutritious snacks handy, such as nuts, trail mixes, dried fruit, and yogurt.   If you have kidney disease or diabetes, eating too much protein may put extra stress on your kidneys. Talk to your health care provider if you have either of those conditions. WHAT ARE SOME HIGH-PROTEIN FOODS? Grains Quinoa. Bulgur wheat. Vegetables Soybeans. Peas. Meats and Other Protein Sources Beef, pork, and poultry. Fish and seafood. Eggs. Tofu. Textured vegetable protein (TVP). Peanut butter. Nuts and seeds. Dried beans. Protein powders. Dairy Whole milk.  Whole-milk yogurt. Powdered milk. Cheese. [employer]. Eggnog. Beverages High-protein supplement drinks. Soy milk. Other Protein bars. The items listed above may not be a complete list of recommended foods or beverages. Contact your dietitian for more options. WHAT ARE SOME HIGH-CALORIE FOODS? Grains Pasta. Quick breads. Muffins. Pancakes. Ready-to-eat cereal. Vegetables Vegetables cooked in oil or butter. Fried potatoes. Fruits Dried fruit. Fruit leather. Canned fruit in syrup. Fruit juice. Avocados. Meats and Other Protein Sources Peanut butter. Nuts and seeds. Dairy Heavy cream. Whipped cream. Cream cheese. Sour cream. Ice cream. Custard. Pudding. Beverages Meal-replacement beverages. Nutrition shakes. Fruit juice. Sugar-sweetened soft drinks. Condiments Salad dressing. Mayonnaise. Alfredo sauce. Fruit preserves or jelly. Honey. Syrup. Sweets/Desserts Cake. Cookies. Pie. Pastries. Candy bars. Chocolate. Fats and Oils Butter or margarine. Oil. Gravy. Other Meal-replacement bars. The items listed above may not be a complete list of recommended foods or beverages. Contact your dietitian for more options. WHAT ARE SOME TIPS FOR INCLUDING HIGH-PROTEIN AND HIGH-CALORIE FOODS IN MY DIET?  Add whole milk, half-and-half, or heavy cream to cereal, pudding, soup, or hot cocoa.  Add whole milk to instant breakfast drinks.  Add peanut butter to oatmeal or smoothies.  Add powdered milk to baked goods, smoothies, or milkshakes.  Add powdered milk, cream, or butter to mashed potatoes.  Add cheese to cooked vegetables.  Make whole-milk yogurt parfaits. Top them with granola, fruit, or nuts.  Add cottage cheese to your fruit.  Add avocados, cheese, or both to sandwiches  or salads.  Add meat, poultry, or seafood to rice, pasta, casseroles, salads, and soups.   Use mayonnaise when making egg salad, chicken salad, or tuna salad.  Use peanut butter as a topping for  pretzels, celery, or crackers.  Add beans to casseroles, dips, and spreads.  Add pureed beans to sauces and soups.  Replace calorie-free drinks with calorie-containing drinks, such as milk and fruit juice.   This information is not intended to replace advice given to you by your health care provider. Make sure you discuss any questions you have with your health care provider.   Document Released: 11/13/2005 Document Revised: 12/04/2014 Document Reviewed: 04/28/2014 Elsevier Interactive Patient Education [employer].

## 2016-01-11 NOTE — Progress Notes (Signed)
Subjective:  Patient ID: [name] [name], male    DOB: Dec 23, 1960  Age: 55 y.o. MRN: [id number]  CC: Knee Pain   HPI [name] [name] presents for    1. Knee pain: both knees. Pain with rising after sitting for a long time. Walking helps relieve the pain. Noticed a little swelling. Tenderness around the knee cap, especially on the L. The pain is 7/10 at the onset of walking. Pain is daily. He has tried heel cushions. Has tried ibuprofen without relief. Also tried OTC glucosamine and chondroitin.   2. Requesting dermatology referral: for known epidermal cyst on scalp and for seborrhea. nizoral shampoo has helped with scalp flakes. Hydrocortisone cream lightened his face.   3. Runny nose: x one month. With dry cough.   4. Weight loss: he has unintentional weight loss. He sometimes skips lunch due to work. He has always been thin. He would like to gain weight. He denies fever, chills, CP, SOB. He has a normal colonoscopy last year.   Past Surgical History  Procedure Laterality Date  . Knee arthroscopy    . Carpel tunnel release Right 07/28/2014     done in [place]   . Elbow surgery    . Knee surgery Left     15 years ago   Social History  Substance Use Topics  . Smoking status: Former Smoker    Types: Cigars  . Smokeless tobacco: Never Used     Comment: smokes occasional black &mild  . Alcohol Use: No    Outpatient Prescriptions Prior to Visit  Medication Sig Dispense Refill  . escitalopram (LEXAPRO) 20 MG tablet Take 1 tablet (20 mg total) by mouth daily. 30 tablet 5  . fluticasone (FLONASE) 50 MCG/ACT nasal spray Place 2 sprays into both nostrils daily. 16 g 2  . hydrocortisone 2.5 % cream Apply topically 2 (two) times daily. Apply to face, stop of skin lightens 30 g 2  . ibuprofen (ADVIL,MOTRIN) 800 MG tablet Take 1 tablet (800 mg total) by mouth every 8 (eight) hours as needed. 21 tablet 0  . ketoconazole (NIZORAL) 2 % shampoo Apply 1 application topically 2 (two) times a week.  Apply to scalp 120 mL 3  . naproxen (NAPROSYN) 500 MG tablet Take 1 tablet (500 mg total) by mouth 2 (two) times daily with a meal. 30 tablet 0  . pantoprazole (PROTONIX) 40 MG tablet Take 1 tablet (40 mg total) by mouth daily. For acid reflux 30 tablet 5  . amoxicillin-clavulanate (AUGMENTIN) 875-125 MG tablet Take 1 tablet by mouth 2 (two) times daily. 10 tablet 0  . fluticasone (FLONASE) 50 MCG/ACT nasal spray Place 2 sprays into both nostrils daily. 16 g 2   No facility-administered medications prior to visit.    ROS Review of Systems  Constitutional: Positive for unexpected weight change. Negative for fever, chills and fatigue.  HENT: Positive for rhinorrhea.   Eyes: Negative for visual disturbance.  Respiratory: Negative for cough and shortness of breath.   Cardiovascular: Negative for chest pain, palpitations and leg swelling.  Gastrointestinal: Negative for nausea, vomiting, abdominal pain, diarrhea, constipation and blood in stool.  Endocrine: Negative for polydipsia, polyphagia and polyuria.  Musculoskeletal: Positive for arthralgias. Negative for myalgias, back pain, gait problem and neck pain.  Skin: Negative for rash.  Allergic/Immunologic: Negative for immunocompromised state.  Hematological: Negative for adenopathy. Does not bruise/bleed easily.  Psychiatric/Behavioral: Negative for suicidal ideas, sleep disturbance and dysphoric mood. The patient is not nervous/anxious.     Objective:  BP 114/76 mmHg  Pulse 64  Temp(Src) 98.2 F (36.8 C) (Oral)  Resp 16  Ht 5\' 9"  (1.753 m)  Wt 138 lb (62.596 kg)  BMI 20.37 kg/m2  SpO2 98%  BP/Weight 01/11/2016 12/28/2015 [id number]  Systolic BP 99991111 [id number]  Diastolic BP 76 80 83  Wt. (Lbs) 138 - -  BMI 20.37 - -   Wt Readings from Last 3 Encounters:  01/11/16 138 lb (62.596 kg)  09/27/15 142 lb (64.411 kg)  09/14/15 136 lb (61.689 kg)    Physical Exam  Constitutional: He appears well-developed and well-nourished. No  distress.  HENT:  Head: Normocephalic and atraumatic.  Neck: Normal range of motion. Neck supple.  Cardiovascular: Normal rate, regular rhythm, normal heart sounds and intact distal pulses.   Pulmonary/Chest: Effort normal and breath sounds normal.  Musculoskeletal: He exhibits no edema.       Right knee: He exhibits normal range of motion, no swelling, no effusion, no ecchymosis, no deformity, no laceration, no erythema, normal alignment, no LCL laxity, normal patellar mobility, no bony tenderness and no MCL laxity. Tenderness found. Medial joint line tenderness noted. No MCL, no LCL and no patellar tendon tenderness noted.       Left knee: He exhibits normal range of motion, no swelling, no effusion, no ecchymosis, no deformity, no laceration, no erythema, normal alignment, no LCL laxity, normal patellar mobility, no bony tenderness, normal meniscus and no MCL laxity. Tenderness found. Medial joint line tenderness noted. No lateral joint line, no MCL, no LCL and no patellar tendon tenderness noted.  Neurological: He is alert.  Skin: Skin is warm and dry. No rash noted. No erythema.     Psychiatric: He has a normal mood and affect.     Assessment & Plan:  [name] [name] was seen today for knee pain.  Diagnoses and all orders for this visit:  Bilateral knee pain -     DG Knee Bilateral Standing AP; Future  Seborrhea -     Ambulatory referral to Dermatology  Epidermal cyst -     Ambulatory referral to Dermatology  Other seasonal allergic rhinitis -     cetirizine (ZYRTEC) 10 MG tablet; Take 1 tablet (10 mg total) by mouth daily.  Loss of weight    There are no diagnoses linked to this encounter.  No orders of the defined types were placed in this encounter.    Follow-up: No Follow-up on file.   Boykin Nearing MD

## 2016-01-11 NOTE — Progress Notes (Signed)
F/U Knee pain bilateral, no changes since last visit  C/C runny nose x1 month  Dry cough  Wt loss.  Requesting Dermatology referal No tobacco user  Pain scale #0 pain scale with walking #7 No suicidal thoughts in the past two weeks

## 2016-01-13 MED ORDER — CETIRIZINE HCL 10 MG PO TABS
10.0000 mg | ORAL_TABLET | Freq: Every day | ORAL | Status: DC
Start: 2016-01-13 — End: 2018-01-05

## 2016-01-13 NOTE — Assessment & Plan Note (Signed)
A; b/l pain without swelling or deformity P: Standing knee x-rays Knee injections at follow up

## 2016-01-13 NOTE — Assessment & Plan Note (Signed)
Allergic rhinitis Add zyrtec

## 2016-01-13 NOTE — Assessment & Plan Note (Signed)
Loss of weight without red flags  High protein diet plan

## 2016-01-21 ENCOUNTER — Encounter: Payer: Self-pay | Admitting: Clinical

## 2016-01-21 ENCOUNTER — Encounter: Payer: Self-pay | Admitting: Family Medicine

## 2016-01-21 ENCOUNTER — Ambulatory Visit: Payer: Self-pay | Attending: Family Medicine | Admitting: Family Medicine

## 2016-01-21 VITALS — BP 116/70 | HR 65 | Temp 98.5°F | Resp 16 | Ht 69.0 in | Wt 135.0 lb

## 2016-01-21 DIAGNOSIS — M25561 Pain in right knee: Secondary | ICD-10-CM | POA: Insufficient documentation

## 2016-01-21 DIAGNOSIS — M25562 Pain in left knee: Secondary | ICD-10-CM | POA: Insufficient documentation

## 2016-01-21 DIAGNOSIS — Z87891 Personal history of nicotine dependence: Secondary | ICD-10-CM | POA: Insufficient documentation

## 2016-01-21 DIAGNOSIS — F411 Generalized anxiety disorder: Secondary | ICD-10-CM | POA: Insufficient documentation

## 2016-01-21 DIAGNOSIS — Z79899 Other long term (current) drug therapy: Secondary | ICD-10-CM | POA: Insufficient documentation

## 2016-01-21 MED ORDER — METHYLPREDNISOLONE ACETATE 40 MG/ML IJ SUSP
40.0000 mg | Freq: Once | INTRAMUSCULAR | Status: AC
  Administered 2016-01-21: 40 mg via INTRA_ARTICULAR

## 2016-01-21 MED ORDER — ESCITALOPRAM OXALATE 10 MG PO TABS
10.0000 mg | ORAL_TABLET | Freq: Every day | ORAL | Status: DC
Start: 2016-01-21 — End: 2016-02-11

## 2016-01-21 NOTE — Progress Notes (Signed)
Subjective:  Patient ID: [name] [name], male    DOB: 04/01/1961  Age: 55 y.o. MRN: [id number]  CC: Knee Pain   HPI [name] [name] presents for   Corticosteroid injections for bilateral knee pain. He has not yet has x-rays of knees done.  Fatigue, he feels lexapro dose is too high. Requesting lower dose.   Social History  Substance Use Topics  . Smoking status: Former Smoker    Types: Cigars  . Smokeless tobacco: Never Used     Comment: smokes occasional black &mild  . Alcohol Use: No    Outpatient Prescriptions Prior to Visit  Medication Sig Dispense Refill  . cetirizine (ZYRTEC) 10 MG tablet Take 1 tablet (10 mg total) by mouth daily. 30 tablet 11  . escitalopram (LEXAPRO) 20 MG tablet Take 1 tablet (20 mg total) by mouth daily. 30 tablet 5  . fluticasone (FLONASE) 50 MCG/ACT nasal spray Place 2 sprays into both nostrils daily. 16 g 2  . ibuprofen (ADVIL,MOTRIN) 800 MG tablet Take 1 tablet (800 mg total) by mouth every 8 (eight) hours as needed. 21 tablet 0  . ketoconazole (NIZORAL) 2 % shampoo Apply 1 application topically 2 (two) times a week. Apply to scalp 120 mL 3  . naproxen (NAPROSYN) 500 MG tablet Take 1 tablet (500 mg total) by mouth 2 (two) times daily with a meal. 30 tablet 0  . pantoprazole (PROTONIX) 40 MG tablet Take 1 tablet (40 mg total) by mouth daily. For acid reflux 30 tablet 5   No facility-administered medications prior to visit.    ROS Review of Systems  Constitutional: Positive for unexpected weight change. Negative for fever, chills and fatigue.  HENT: Positive for rhinorrhea.   Eyes: Negative for visual disturbance.  Respiratory: Negative for cough and shortness of breath.   Cardiovascular: Negative for chest pain, palpitations and leg swelling.  Gastrointestinal: Negative for nausea, vomiting, abdominal pain, diarrhea, constipation and blood in stool.  Endocrine: Negative for polydipsia, polyphagia and polyuria.  Musculoskeletal: Positive for  arthralgias. Negative for myalgias, back pain, gait problem and neck pain.  Skin: Negative for rash.  Allergic/Immunologic: Negative for immunocompromised state.  Hematological: Negative for adenopathy. Does not bruise/bleed easily.  Psychiatric/Behavioral: Negative for suicidal ideas, sleep disturbance and dysphoric mood. The patient is not nervous/anxious.     Objective:  BP 116/70 mmHg  Pulse 65  Temp(Src) 98.5 F (36.9 C) (Oral)  Resp 16  Ht 5\' 9"  (1.753 m)  Wt 135 lb (61.236 kg)  BMI 19.93 kg/m2  SpO2 100%  BP/Weight 01/21/2016 01/11/2016 [id number]  Systolic BP 99991111 [id number]  Diastolic BP 70 76 80  Wt. (Lbs) 135 138 -  BMI 19.93 20.37 -    Physical Exam  Constitutional: He appears well-developed and well-nourished. No distress.  HENT:  Head: Normocephalic and atraumatic.  Neck: Normal range of motion. Neck supple.  Cardiovascular: Normal rate, regular rhythm, normal heart sounds and intact distal pulses.   Pulmonary/Chest: Effort normal and breath sounds normal.  Musculoskeletal: He exhibits no edema.       Right knee: He exhibits normal range of motion, no swelling, no effusion, no ecchymosis, no deformity, no laceration, no erythema, normal alignment, no LCL laxity, normal patellar mobility, no bony tenderness and no MCL laxity. Tenderness found. Medial joint line tenderness noted. No MCL, no LCL and no patellar tendon tenderness noted.       Left knee: He exhibits normal range of motion, no swelling, no effusion, no ecchymosis, no deformity,  no laceration, no erythema, normal alignment, no LCL laxity, normal patellar mobility, no bony tenderness, normal meniscus and no MCL laxity. Tenderness found. Medial joint line tenderness noted. No lateral joint line, no MCL, no LCL and no patellar tendon tenderness noted.  Neurological: He is alert.  Skin: Skin is warm and dry. No rash noted. No erythema.     Psychiatric: He has a normal mood and affect.    After obtaining informed  consent and cleaning the skin using iodine and alcohol a  steroid injection was performed at both knees using a medial approach using 1% plain Lidocaine and 40 mg of Depo medrol in each knee. This was well tolerated  Assessment & Plan:   [name] [name] was seen today for knee pain.  Diagnoses and all orders for this visit:  Bilateral knee pain -     methylPREDNISolone acetate (DEPO-MEDROL) injection 40 mg; Inject 1 mL (40 mg total) into the articular space once. -     methylPREDNISolone acetate (DEPO-MEDROL) injection 40 mg; Inject 1 mL (40 mg total) into the articular space once.  Generalized anxiety disorder -     escitalopram (LEXAPRO) 10 MG tablet; Take 1 tablet (10 mg total) by mouth daily.   Follow-up: No Follow-up on file.   Boykin Nearing MD

## 2016-01-21 NOTE — Progress Notes (Signed)
Pt here for steroid injection on knee bilateral  No pain today  Pains worsen with movement  No tobacco user  No suicidal thoughts in the past two weeks

## 2016-01-21 NOTE — Progress Notes (Signed)
Depression screen [facility] 2/9 01/21/2016 01/11/2016 09/14/2015 07/07/2015 07/07/2015  Decreased Interest 0 0 0 0 0  Down, Depressed, Hopeless 0 0 0 0 0  PHQ - 2 Score 0 0 0 0 0  Altered sleeping 0 0 - - -  Tired, decreased energy 1 0 - - -  Change in appetite 0 1 - - -  Feeling bad or failure about yourself  0 0 - - -  Trouble concentrating 0 0 - - -  Moving slowly or fidgety/restless 0 0 - - -  Suicidal thoughts 0 0 - - -  PHQ-9 Score 1 1 - - -    GAD 7 : Generalized Anxiety Score 01/21/2016 01/11/2016  Nervous, Anxious, on Edge 0 0  Control/stop worrying 0 0  Worry too much - different things 0 0  Trouble relaxing 0 0  Restless 0 0  Easily annoyed or irritable 0 0  Afraid - awful might happen 0 0  Total GAD 7 Score 0 0

## 2016-01-21 NOTE — Patient Instructions (Addendum)
[name] [name] was seen today for knee pain.  Diagnoses and all orders for this visit:  Bilateral knee pain -     methylPREDNISolone acetate (DEPO-MEDROL) injection 40 mg; Inject 1 mL (40 mg total) into the articular space once. -     methylPREDNISolone acetate (DEPO-MEDROL) injection 40 mg; Inject 1 mL (40 mg total) into the articular space once.   You have received a shot of steroid in your joint today. Rest and ice knee today. Regular activity tomorrow. Look out for redness, swelling, fever,severe pain in joint and call if you experience these symptoms.  F/u in 6 weeks   Dr. Adrian Blackwater

## 2016-01-25 ENCOUNTER — Inpatient Hospital Stay (HOSPITAL_COMMUNITY)
Admission: EM | Admit: 2016-01-25 | Discharge: 2016-01-28 | DRG: 871 | Disposition: A | Discharge status: Home / Self Care | Payer: BLUE CROSS/BLUE SHIELD | Attending: Internal Medicine | Admitting: Internal Medicine

## 2016-01-25 ENCOUNTER — Encounter (HOSPITAL_COMMUNITY): Payer: Self-pay | Admitting: Emergency Medicine

## 2016-01-25 ENCOUNTER — Emergency Department (HOSPITAL_COMMUNITY): Payer: BLUE CROSS/BLUE SHIELD

## 2016-01-25 DIAGNOSIS — J9601 Acute respiratory failure with hypoxia: Secondary | ICD-10-CM | POA: Diagnosis present

## 2016-01-25 DIAGNOSIS — R0602 Shortness of breath: Secondary | ICD-10-CM | POA: Diagnosis not present

## 2016-01-25 DIAGNOSIS — A419 Sepsis, unspecified organism: Secondary | ICD-10-CM | POA: Diagnosis present

## 2016-01-25 DIAGNOSIS — D860 Sarcoidosis of lung: Secondary | ICD-10-CM | POA: Diagnosis present

## 2016-01-25 DIAGNOSIS — R042 Hemoptysis: Secondary | ICD-10-CM | POA: Diagnosis present

## 2016-01-25 DIAGNOSIS — E785 Hyperlipidemia, unspecified: Secondary | ICD-10-CM | POA: Diagnosis present

## 2016-01-25 DIAGNOSIS — J159 Unspecified bacterial pneumonia: Secondary | ICD-10-CM | POA: Diagnosis present

## 2016-01-25 DIAGNOSIS — J189 Pneumonia, unspecified organism: Secondary | ICD-10-CM

## 2016-01-25 DIAGNOSIS — F329 Major depressive disorder, single episode, unspecified: Secondary | ICD-10-CM | POA: Diagnosis present

## 2016-01-25 DIAGNOSIS — K219 Gastro-esophageal reflux disease without esophagitis: Secondary | ICD-10-CM | POA: Diagnosis present

## 2016-01-25 DIAGNOSIS — F10129 Alcohol abuse with intoxication, unspecified: Secondary | ICD-10-CM | POA: Diagnosis present

## 2016-01-25 DIAGNOSIS — F411 Generalized anxiety disorder: Secondary | ICD-10-CM | POA: Diagnosis present

## 2016-01-25 DIAGNOSIS — Z87891 Personal history of nicotine dependence: Secondary | ICD-10-CM

## 2016-01-25 MED ORDER — DEXTROSE 5 % IV SOLN
1.0000 g | Freq: Once | INTRAVENOUS | Status: AC
  Administered 2016-01-26: 1 g via INTRAVENOUS
  Filled 2016-01-25: qty 10

## 2016-01-25 MED ORDER — ALBUTEROL SULFATE (2.5 MG/3ML) 0.083% IN NEBU
5.0000 mg | INHALATION_SOLUTION | Freq: Once | RESPIRATORY_TRACT | Status: AC
  Administered 2016-01-26: 5 mg via RESPIRATORY_TRACT
  Filled 2016-01-25: qty 6

## 2016-01-25 MED ORDER — SODIUM CHLORIDE 0.9 % IV BOLUS (SEPSIS)
1000.0000 mL | Freq: Once | INTRAVENOUS | Status: AC
  Administered 2016-01-26: 1000 mL via INTRAVENOUS

## 2016-01-25 MED ORDER — AZITHROMYCIN 250 MG PO TABS
500.0000 mg | ORAL_TABLET | Freq: Once | ORAL | Status: AC
  Administered 2016-01-26: 500 mg via ORAL
  Filled 2016-01-25: qty 2

## 2016-01-25 MED ORDER — SODIUM CHLORIDE 0.9 % IV BOLUS (SEPSIS)
1000.0000 mL | Freq: Once | INTRAVENOUS | Status: AC
  Administered 2016-01-25: 1000 mL via INTRAVENOUS

## 2016-01-25 NOTE — ED Notes (Signed)
Patient transported to X-ray 

## 2016-01-25 NOTE — ED Provider Notes (Signed)
CSN: [id number]     Arrival date & time 01/25/16  2235 History   First MD Initiated Contact with Patient 01/25/16 2254     Chief Complaint  Patient presents with  . 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 He works as a Librarian, academic at [employer], Nurse, adult, he was riding the bus home when he became acutely weak, reports that he almost passed out, he was having body aches, he has been coughing, it was reported that he had a low blood pressure in the field. The symptoms were severe, at this time the patient is feeling slightly better but still feeling hot, chills, generally weak. He has been coughing for a couple of days with purulent sputum.  Patient is a 55 y.o. male presenting with near-syncope. The history is provided by the patient.  Near Syncope    Past Medical History  Diagnosis Date  . 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 Reflux Dx 2007  . Pancreatitis Dx 2014  . Anxiety     About 55 years of age  . Depression     At 55 years of age  . GERD (gastroesophageal reflux disease) Dx 2007  . Carpal tunnel syndrome of right wrist Dx 2015  . Alcohol abuse     Last Drink on Sep 2015  . Pneumonia 11/2014  . Hepatitis, alcoholic, acute 123456  . Hyperlipidemia     borderline, diet controlled  . 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 Knee arthroscopy    . Carpel tunnel release Right 07/28/2014     done in [place]   . Elbow surgery    . Knee surgery Left     15 years ago   Family History  Problem Relation Age of Onset  . Diabetes Father   . Hypertension Father   . Hypertension Paternal Grandfather   . Alcohol abuse Paternal Grandfather   . Alcohol abuse Maternal Grandfather   . Alcohol abuse Maternal  Grandmother   . Alcohol abuse Paternal Grandmother   . Colon cancer Neg Hx   . Rectal cancer Neg Hx   . Stomach cancer Neg Hx    Social History  Substance Use Topics  . Smoking status: Former Smoker    Types: Cigars  . Smokeless tobacco: Never Used     Comment: smokes occasional black &mild  . Alcohol Use: No    Review of Systems  Cardiovascular: Positive for near-syncope.  All other systems reviewed and are negative.     Allergies  Oxycodone  Home Medications   Prior to Admission medications   Medication Sig Start Date End Date Taking? Authorizing Provider  cetirizine (ZYRTEC) 10 MG tablet Take 1 tablet (10 mg total) by mouth daily. 01/13/16   Josalyn Funches, MD  escitalopram (LEXAPRO) 10 MG tablet Take 1 tablet (10 mg total) by mouth daily. 01/21/16   Josalyn Funches, MD  fluticasone (FLONASE) 50 MCG/ACT nasal spray Place 2 sprays into both nostrils daily. 12/17/15   Konrad Felix, PA  ibuprofen (ADVIL,MOTRIN) 800 MG tablet Take 1 tablet (800 mg total) by mouth every 8 (eight) hours as needed. 05/15/15   Dalia Heading, PA-C  ketoconazole (NIZORAL) 2 % shampoo Apply 1 application topically 2 (two) times a week. Apply to scalp 07/08/15  Boykin Nearing, MD  naproxen (NAPROSYN) 500 MG tablet Take 1 tablet (500 mg total) by mouth 2 (two) times daily with a meal. 09/14/15   Posey Boyer, MD  pantoprazole (PROTONIX) 40 MG tablet Take 1 tablet (40 mg total) by mouth daily. For acid reflux 09/27/15   Josalyn Funches, MD   BP 116/73 mmHg  Pulse 81  Temp(Src) 100.9 F (38.3 C) (Oral)  Resp 25  SpO2 91% Physical Exam  Constitutional: He appears well-developed and well-nourished. No distress.  HENT:  Head: Normocephalic and atraumatic.  Mouth/Throat: Oropharynx is clear and moist. No oropharyngeal exudate.  Eyes: Conjunctivae and EOM are normal. Pupils are equal, round, and reactive to light. Right eye exhibits no discharge. Left eye exhibits no discharge. No scleral  icterus.  Neck: Normal range of motion. Neck supple. No JVD present. No thyromegaly present.  Cardiovascular: Normal rate, regular rhythm, normal heart sounds and intact distal pulses.  Exam reveals no gallop and no friction rub.   No murmur heard. Pulmonary/Chest: Effort normal and breath sounds normal. No respiratory distress. He has no wheezes. He has no rales.  Abdominal: Soft. Bowel sounds are normal. He exhibits no distension and no mass. There is no tenderness.  Musculoskeletal: Normal range of motion. He exhibits no edema or tenderness.  Lymphadenopathy:    He has no cervical adenopathy.  Neurological: He is alert. Coordination normal.  Skin: Skin is warm and dry. No rash noted. No erythema.  Psychiatric: He has a normal mood and affect. His behavior is normal.  Nursing note and vitals reviewed.   ED Course  Procedures (including critical care time) Labs Review Labs Reviewed  CBC WITH DIFFERENTIAL/PLATELET - Abnormal; Notable for the following:    Hemoglobin 12.0 (*)    HCT 38.6 (*)    MCH 24.8 (*)    All other components within normal limits  I-STAT CG4 LACTIC ACID, ED - Abnormal; Notable for the following:    Lactic Acid, Venous 2.21 (*)    All other components within normal limits  I-STAT CHEM 8, ED - Abnormal; Notable for the following:    Calcium, Ion 1.05 (*)    All other components within normal limits    Imaging Review Dg Chest 2 View  01/25/2016  CLINICAL DATA:  55 year old male with cough. History of sarcoidosis. EXAM: CHEST  2 VIEW COMPARISON:  Radiograph dated 12/27/2015 FINDINGS: Two views of the chest demonstrate emphysematous changes of the lungs with areas of biapical scarring. There is upward retraction of the hila bilaterally compatible with history of sarcoidosis. There is diffuse interstitial prominence similar to prior study. A focal area of apparent increased density in the left lung base noted which is more prominent compared to the prior study and may  represent developing pneumonia. Clinical correlation is recommended. There is no pleural effusion or pneumothorax. The cardiac silhouette is within normal limits. The osseous structures are grossly unremarkable. IMPRESSION: Chronic lung disease with areas of scarring compatible with known sarcoidosis. A focal area of slightly increased density at the left lung base may represent developing pneumonia. Clinical correlation and follow-up recommended. Electronically Signed   By: Anner Crete M.D.   On: 01/25/2016 23:48   I have personally reviewed and evaluated these images and lab results as part of my medical decision-making.   EKG Interpretation   Date/Time:  Tuesday January 25 2016 22:45:41 EST Ventricular Rate:  72 PR Interval:  153 QRS Duration: 97 QT Interval:  352 QTC Calculation: 385 R Axis:  58 Text Interpretation:  Sinus rhythm Left atrial enlargement Left  ventricular hypertrophy Anterior Q waves, possibly due to LVH Artifact in  lead(s) I III aVL V1 V2 V3 V5 V6 No old tracing to compare Confirmed by  MILLER  MD, [place] ([id number]) on 01/26/2016 12:12:27 AM      MDM   Final diagnoses:  None    The patient has vital signs which show an oxygenation of 92% on room air which I have personally measured in the room with the patient. He has no tachycardia, no hypotension, he is mildly tachypneic and is coughing up purulent sputum at the bedside. We'll obtain a lactic acid, CBC, chest x-ray, albuterol, antibiotics to Treat community acquired pneumonia in case the patient does have early sepsis. He is very hot to the touch and I suspect he is febrile.  I have personally looked at and interpreted the x-ray, I feel that the patient has a left-sided infiltrate, the radiologist agrees. He does have a fever, he is not tachycardic but does have a new oxygen requirement and is now having some hemoptysis. The patient will need to be admitted to the hospital, care was discussed with the  hospitalist who agrees to admit and requests holding orders. - observational / tele - per Dr. Myna Hidalgo  Medications  sodium chloride 0.9 % bolus 1,000 mL (1,000 mLs Intravenous New Bag/Given 01/26/16 0003)  sodium chloride 0.9 % bolus 1,000 mL (0 mLs Intravenous Stopped 01/26/16 0005)  cefTRIAXone (ROCEPHIN) 1 g in dextrose 5 % 50 mL IVPB (1 g Intravenous New Bag/Given 01/26/16 0004)  azithromycin (ZITHROMAX) tablet 500 mg (500 mg Oral Given 01/26/16 0004)  albuterol (PROVENTIL) (2.5 MG/3ML) 0.083% nebulizer solution 5 mg (5 mg Nebulization Given 01/26/16 0005)      Noemi Chapel, MD 01/26/16 [phone]

## 2016-01-25 NOTE — ED Notes (Signed)
Dr Miller at bedside at this time.

## 2016-01-25 NOTE — ED Notes (Signed)
Patient arrived to ED via [facility]. EMS reports: patient riding bus home from work. Patient became very weak with reported near syncopal episode. Reports sudden onset of symptoms - body aches, weakness. Bus driver called EMS. Patient experienced chills en route to ED, with reported abdominal pain upon arrival to ED. Also, reports that recently he has been experiencing cough, congestion with chest tightness. C/o chest tightness 7/10. 18 gauge in Left AC. 400 ml NS infused. NSR on monitor. First BP - 88/60. VS just prior to arrival - BP 121/76, Pulse 64. CBG 183.

## 2016-01-26 ENCOUNTER — Encounter (HOSPITAL_COMMUNITY): Payer: Self-pay | Admitting: Family Medicine

## 2016-01-26 DIAGNOSIS — J9601 Acute respiratory failure with hypoxia: Secondary | ICD-10-CM | POA: Diagnosis present

## 2016-01-26 DIAGNOSIS — F411 Generalized anxiety disorder: Secondary | ICD-10-CM

## 2016-01-26 DIAGNOSIS — R042 Hemoptysis: Secondary | ICD-10-CM | POA: Diagnosis present

## 2016-01-26 DIAGNOSIS — K219 Gastro-esophageal reflux disease without esophagitis: Secondary | ICD-10-CM | POA: Diagnosis present

## 2016-01-26 DIAGNOSIS — J189 Pneumonia, unspecified organism: Secondary | ICD-10-CM | POA: Diagnosis present

## 2016-01-26 DIAGNOSIS — F329 Major depressive disorder, single episode, unspecified: Secondary | ICD-10-CM | POA: Diagnosis present

## 2016-01-26 DIAGNOSIS — D860 Sarcoidosis of lung: Secondary | ICD-10-CM | POA: Diagnosis present

## 2016-01-26 DIAGNOSIS — A419 Sepsis, unspecified organism: Secondary | ICD-10-CM | POA: Diagnosis present

## 2016-01-26 DIAGNOSIS — Z87891 Personal history of nicotine dependence: Secondary | ICD-10-CM | POA: Diagnosis not present

## 2016-01-26 DIAGNOSIS — E785 Hyperlipidemia, unspecified: Secondary | ICD-10-CM | POA: Diagnosis present

## 2016-01-26 DIAGNOSIS — J159 Unspecified bacterial pneumonia: Secondary | ICD-10-CM | POA: Diagnosis present

## 2016-01-26 DIAGNOSIS — R0602 Shortness of breath: Secondary | ICD-10-CM | POA: Diagnosis present

## 2016-01-26 LAB — CBC WITH DIFFERENTIAL/PLATELET
Basophils Absolute: 0 10*3/uL (ref 0.0–0.1)
Basophils Relative: 0 %
Eosinophils Absolute: 0.1 10*3/uL (ref 0.0–0.7)
Eosinophils Relative: 1 %
HCT: 38.6 % — ABNORMAL LOW (ref 39.0–52.0)
Hemoglobin: 12 g/dL — ABNORMAL LOW (ref 13.0–17.0)
Lymphocytes Relative: 12 %
Lymphs Abs: 0.7 10*3/uL (ref 0.7–4.0)
MCH: 24.8 pg — ABNORMAL LOW (ref 26.0–34.0)
MCHC: 31.1 g/dL (ref 30.0–36.0)
MCV: 79.8 fL (ref 78.0–100.0)
Monocytes Absolute: 0.1 10*3/uL (ref 0.1–1.0)
Monocytes Relative: 2 %
Neutro Abs: 4.9 10*3/uL (ref 1.7–7.7)
Neutrophils Relative %: 85 %
Platelets: 153 10*3/uL (ref 150–400)
RBC: 4.84 MIL/uL (ref 4.22–5.81)
RDW: 15.5 % (ref 11.5–15.5)
WBC: 5.8 10*3/uL (ref 4.0–10.5)

## 2016-01-26 LAB — LACTIC ACID, PLASMA
Lactic Acid, Venous: 1.1 mmol/L (ref 0.5–2.0)
Lactic Acid, Venous: 1.2 mmol/L (ref 0.5–2.0)

## 2016-01-26 LAB — HIV ANTIBODY (ROUTINE TESTING W REFLEX): HIV Screen 4th Generation wRfx: NONREACTIVE

## 2016-01-26 LAB — I-STAT CHEM 8, ED
BUN: 19 mg/dL (ref 6–20)
Calcium, Ion: 1.05 mmol/L — ABNORMAL LOW (ref 1.12–1.23)
Chloride: 101 mmol/L (ref 101–111)
Creatinine, Ser: 1.1 mg/dL (ref 0.61–1.24)
Glucose, Bld: 84 mg/dL (ref 65–99)
HCT: 41 % (ref 39.0–52.0)
Hemoglobin: 13.9 g/dL (ref 13.0–17.0)
Potassium: 4 mmol/L (ref 3.5–5.1)
Sodium: 140 mmol/L (ref 135–145)
TCO2: 25 mmol/L (ref 0–100)

## 2016-01-26 LAB — PROCALCITONIN: Procalcitonin: 4.15 ng/mL

## 2016-01-26 LAB — I-STAT CG4 LACTIC ACID, ED: Lactic Acid, Venous: 2.21 mmol/L (ref 0.5–2.0)

## 2016-01-26 LAB — TROPONIN I
Troponin I: <0.03 ng/mL (ref <0.031)
Troponin I: <0.03 ng/mL (ref <0.031)
Troponin I: <0.03 ng/mL (ref <0.031)

## 2016-01-26 LAB — STREP PNEUMONIAE URINARY ANTIGEN: Strep Pneumo Urinary Antigen: POSITIVE — AB

## 2016-01-26 LAB — BRAIN NATRIURETIC PEPTIDE: B Natriuretic Peptide: 51.6 pg/mL (ref 0.0–100.0)

## 2016-01-26 MED ORDER — ESCITALOPRAM OXALATE 10 MG PO TABS
10.0000 mg | ORAL_TABLET | Freq: Every day | ORAL | Status: DC
  Administered 2016-01-26 – 2016-01-28 (×3): 10 mg via ORAL
  Filled 2016-01-26 (×3): qty 1

## 2016-01-26 MED ORDER — LORATADINE 10 MG PO TABS
10.0000 mg | ORAL_TABLET | Freq: Every day | ORAL | Status: DC
  Administered 2016-01-26 – 2016-01-28 (×3): 10 mg via ORAL
  Filled 2016-01-26 (×3): qty 1

## 2016-01-26 MED ORDER — ACETAMINOPHEN 325 MG PO TABS
325.0000 mg | ORAL_TABLET | ORAL | Status: DC | PRN
  Administered 2016-01-26 – 2016-01-27 (×2): 325 mg via ORAL
  Filled 2016-01-26 (×2): qty 1

## 2016-01-26 MED ORDER — DEXTROSE 5 % IV SOLN
500.0000 mg | INTRAVENOUS | Status: DC
  Administered 2016-01-26 – 2016-01-27 (×2): 500 mg via INTRAVENOUS
  Filled 2016-01-26 (×3): qty 500

## 2016-01-26 MED ORDER — SODIUM CHLORIDE 0.9 % IV SOLN
INTRAVENOUS | Status: AC
  Administered 2016-01-26 (×2): via INTRAVENOUS

## 2016-01-26 MED ORDER — ENSURE ENLIVE PO LIQD
237.0000 mL | Freq: Two times a day (BID) | ORAL | Status: DC
  Administered 2016-01-26 – 2016-01-28 (×4): 237 mL via ORAL

## 2016-01-26 MED ORDER — ENOXAPARIN SODIUM 40 MG/0.4ML ~~LOC~~ SOLN
40.0000 mg | SUBCUTANEOUS | Status: DC
  Administered 2016-01-26 – 2016-01-27 (×2): 40 mg via SUBCUTANEOUS
  Filled 2016-01-26 (×2): qty 0.4

## 2016-01-26 MED ORDER — DEXTROSE 5 % IV SOLN
2.0000 g | INTRAVENOUS | Status: DC
  Administered 2016-01-26 – 2016-01-27 (×2): 2 g via INTRAVENOUS
  Filled 2016-01-26 (×3): qty 2

## 2016-01-26 MED ORDER — LORAZEPAM 2 MG/ML IJ SOLN
1.0000 mg | Freq: Once | INTRAMUSCULAR | Status: AC
  Administered 2016-01-26: 1 mg via INTRAVENOUS
  Filled 2016-01-26: qty 1

## 2016-01-26 MED ORDER — FLUTICASONE PROPIONATE 50 MCG/ACT NA SUSP
2.0000 | Freq: Every day | NASAL | Status: DC
  Administered 2016-01-26 – 2016-01-28 (×3): 2 via NASAL
  Filled 2016-01-26: qty 16

## 2016-01-26 NOTE — ED Notes (Signed)
Phlebotomy at bedside at this time.

## 2016-01-26 NOTE — Progress Notes (Signed)
Initial Nutrition Assessment  INTERVENTION:  -Continue Ensure Enlive BID, 350 kcal, 20 grams of protein per bottle -Provide snacks daily  NUTRITION DIAGNOSIS:   Increased nutrient needs related to social / environmental circumstances as evidenced by estimated needs, percent weight loss, moderate depletions of muscle mass.  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 Pt states PTA his appetite was adequate consuming 2-3 meals daily. Last night reports a decrease in appetite, but that it has improved today. Per chart he is consuming 100% of meals. PTA pt was consuming one Ensure daily. Continue Ensure BID to help meet nutritional needs. Will provide snacks in between meals, pt requested fruit cup.   Pt reports weight loss of 15 lb (10%) weight loss within a year, which is not significant for timeframe. Weight history per chart supports pt's report.  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 He also does not own a car and reports he walks miles and takes the bus for transportation.    Pt reports his MD placed him on a high protein diet to promote weight gain. Provided "Suggestions for Increasing Calories and Protein" handout from the [employer].   Conducted nutrition focused physical exam identified moderate muscle wasting, no fat wasting and no edema present.  Medications reviewed. Labs reviewed.   Diet Order:  Diet regular Room service appropriate?: Yes; Fluid consistency:: Thin  Skin:   Reviewed, no issues  Last BM:  01/25/2016  Height:   Ht Readings from Last 1 Encounters:  01/26/16 5\' 9"  (1.753 m)    Weight:   Wt Readings from Last 1 Encounters:  01/26/16 137 lb 4.8 oz (62.279 kg)    Ideal Body Weight:  72.7 kg  BMI:  Body mass index is 20.27 kg/(m^2).  Estimated Nutritional Needs:   Kcal:  [id number]  Protein:  80-90 grams  Fluid:  >/= 2 L  EDUCATION NEEDS:   Education needs addressed  Raford Pitcher, Dietetic Intern Pager: [phone]

## 2016-01-26 NOTE — H&P (Signed)
[facility] History and Physical  [name] [name] [id number] DOB: 20-Nov-1961 DOA: 01/25/2016  Referring physician: ED physician PCP: Minerva Ends, MD  Specialists:  Dr. Gwenette Greet (pulmonology), Dr. Carlean Purl (GI)   Chief Complaint:  [name] [name], generalized weakness, SOB   HPI: [name] [name] is a 55 y.o. 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 Patient reports development of rhinorrhea and sore throat approximately 3 weeks ago that had initially seemed to improve, but then his condition apparently re-worsened approximately one week ago with development of productive cough and malaise. He initially experienced dyspnea with exertion and described as cough that is productive of thick white-yellow sputum. 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 While riding public transit home from work, he worsened acutely with dyspnea at rest, and profound generalized weakness with sensation that he may lose consciousness. EMS was activated by the bus driver and the patient was transported to the ED.  In ED, patient was found to be febrile to 38.3 C, saturating in the mid 80s on room air, and with remaining vital signs stable. Chest x-ray demonstrates chronic findings associated with his known sarcoidosis, as well as a new focal airspace opacity in the left base consistent with the pneumonia given the clinical scenario. BMP and CBC were essentially normal and lactic acid returned elevated to a value of 2.21. Patient was given a nebulized albuterol treatment, 2 L of normal saline were bolused, and empiric Rocephin and azithromycin were initiated. Patient continued to be in respiratory distress and required  supplemental oxygen in the emergency department, though remaining hemodynamically stable. He will be admitted to the telemetry unit for ongoing evaluation and management of acute hypoxic respiratory failure suspected secondary to community-acquired pneumonia.   Where does patient live?   At home     Can patient participate in ADLs?  Yes       Review of Systems:   General: no fevers, sweats, weight change, poor appetite. Chills and fatigue  HEENT: no blurry vision, hearing changes or sore throat Pulm: no wheeze. Dyspnea, cough productive of thick purulent sputum  CV: no chest pain or palpitations. Chest tightness with deep inspiration  Abd: no nausea, vomiting, abdominal pain, diarrhea, or constipation GU: no dysuria, hematuria, increased urinary frequency, or urgency  Ext: no leg edema Neuro: no focal weakness, numbness, or tingling, no vision change or hearing loss Skin: no rash, no wounds MSK: No muscle spasm, no deformity, no red, hot, or swollen joint Heme: No easy bruising or bleeding Travel history: No recent long distant travel    Allergy:  Allergies  Allergen Reactions  . Oxycodone Other (See Comments)    abd pain, stomach cramps     Past Medical History  Diagnosis Date  . 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 Reflux Dx 2007  . Pancreatitis Dx 2014  . Anxiety     About 55 years of age  . Depression     At 55 years of age  . GERD (gastroesophageal reflux disease) Dx 2007  . Carpal tunnel syndrome of right wrist Dx 2015  . Alcohol abuse     Last Drink on Sep 2015  . Pneumonia 11/2014  . Hepatitis, alcoholic, acute 123456  .  Hyperlipidemia     borderline, diet controlled  . 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 Knee arthroscopy    . Carpel tunnel release Right 07/28/2014     done in [place]   . Elbow surgery    . Knee surgery Left     15 years ago    Social History:  reports that he has  quit smoking. His smoking use included Cigars. He has never used smokeless tobacco. He reports that he does not drink alcohol or use illicit drugs.  Family History:  Family History  Problem Relation Age of Onset  . Diabetes Father   . Hypertension Father   . Hypertension Paternal Grandfather   . Alcohol abuse Paternal Grandfather   . Alcohol abuse Maternal Grandfather   . Alcohol abuse Maternal Grandmother   . Alcohol abuse Paternal Grandmother   . Colon cancer Neg Hx   . Rectal cancer Neg Hx   . Stomach cancer Neg Hx      Prior to Admission medications   Medication Sig Start Date End Date Taking? Authorizing Provider  cetirizine (ZYRTEC) 10 MG tablet Take 1 tablet (10 mg total) by mouth daily. 01/13/16  Yes Josalyn Funches, MD  escitalopram (LEXAPRO) 10 MG tablet Take 1 tablet (10 mg total) by mouth daily. 01/21/16  Yes Josalyn Funches, MD  fluticasone (FLONASE) 50 MCG/ACT nasal spray Place 2 sprays into both nostrils daily. 12/17/15  Yes Konrad Felix, PA  ibuprofen (ADVIL,MOTRIN) 800 MG tablet Take 1 tablet (800 mg total) by mouth every 8 (eight) hours as needed. Patient not taking: Reported on 01/26/2016 05/15/15   Dalia Heading, PA-C  ketoconazole (NIZORAL) 2 % shampoo Apply 1 application topically 2 (two) times a week. Apply to scalp Patient not taking: Reported on 01/26/2016 07/08/15   Boykin Nearing, MD  naproxen (NAPROSYN) 500 MG tablet Take 1 tablet (500 mg total) by mouth 2 (two) times daily with a meal. Patient not taking: Reported on 01/26/2016 09/14/15   Posey Boyer, MD  pantoprazole (PROTONIX) 40 MG tablet Take 1 tablet (40 mg total) by mouth daily. For acid reflux Patient not taking: Reported on 01/26/2016 09/27/15   Boykin Nearing, MD    Physical Exam: Filed Vitals:   01/26/16 0100 01/26/16 0115 01/26/16 0130 01/26/16 0145  BP: 107/52 99/59 105/59 102/60  Pulse: 121 121 121 113  Temp:      TempSrc:      Resp:      SpO2: 88% 92% 84% 86%   General: Acute  respiratory distress with gasping between 3-4 words and diaphoresis  HEENT:       Eyes: PERRL, EOMI, no scleral icterus or conjunctival pallor.       ENT: No discharge from the ears or nose, no pharyngeal ulcers, petechiae or exudate.        Neck: No JVD, no bruit, no appreciable mass Heme: No cervical adenopathy, no pallor Cardiac: S1/S2, RRR, No murmurs, No gallops or rubs. Pulm: Good air movement bilaterally. Coarse ronchi over bilateral lower lung fields.  Abd: Soft, nondistended, nontender, no rebound pain or gaurding, no mass or organomegaly, BS present. Ext: No LE edema bilaterally. 2+DP/PT pulse bilaterally. Musculoskeletal: No gross deformity, no red, hot, swollen joints   Skin: No rashes or wounds on exposed surfaces  Neuro: Alert, oriented X3, cranial nerves II-XII grossly intact. No focal findings Psych: Patient is not overtly psychotic, anxious, scared.  Labs on Admission:  Basic Metabolic Panel:  Recent Labs Lab 01/26/16 0003  NA 140  K 4.0  CL 101  GLUCOSE 84  BUN 19  CREATININE 1.10   Liver Function Tests: No results for input(s): AST, ALT, ALKPHOS, BILITOT, PROT, ALBUMIN in the last 168 hours. No results for input(s): LIPASE, AMYLASE in the last 168 hours. No results for input(s): AMMONIA in the last 168 hours. CBC:  Recent Labs Lab 01/25/16 2350 01/26/16 0003  WBC 5.8  --   NEUTROABS 4.9  --   HGB 12.0* 13.9  HCT 38.6* 41.0  MCV 79.8  --   PLT 153  --    Cardiac Enzymes: No results for input(s): CKTOTAL, CKMB, CKMBINDEX, TROPONINI in the last 168 hours.  BNP (last 3 results) No results for input(s): BNP in the last 8760 hours.  ProBNP (last 3 results) No results for input(s): PROBNP in the last 8760 hours.  CBG: No results for input(s): GLUCAP in the last 168 hours.  Radiological Exams on Admission: Dg Chest 2 View  01/25/2016  CLINICAL DATA:  55 year old male with cough. History of sarcoidosis. EXAM: CHEST  2 VIEW COMPARISON:  Radiograph  dated 12/27/2015 FINDINGS: Two views of the chest demonstrate emphysematous changes of the lungs with areas of biapical scarring. There is upward retraction of the hila bilaterally compatible with history of sarcoidosis. There is diffuse interstitial prominence similar to prior study. A focal area of apparent increased density in the left lung base noted which is more prominent compared to the prior study and may represent developing pneumonia. Clinical correlation is recommended. There is no pleural effusion or pneumothorax. The cardiac silhouette is within normal limits. The osseous structures are grossly unremarkable. IMPRESSION: Chronic lung disease with areas of scarring compatible with known sarcoidosis. A focal area of slightly increased density at the left lung base may represent developing pneumonia. Clinical correlation and follow-up recommended. Electronically Signed   By: Anner Crete M.D.   On: 01/25/2016 23:48    EKG: Independently reviewed.  Abnormal findings:  Sinus rhythm, LVH by voltage criteria, anterior Q-waves   Assessment/Plan  1. Acute respiratory failure with hypoxia secondary to CAP  - Pt had been suffering URI sxs for close to a month, now with apparent development of secondary bacterial PNA  - He has developed hemoptysis tonight and PE has been considered, but felt to be very low probability as was preceded by purulent sputum and there are no identified RFs for VTE  - 30 cc/kg bolus given in ED  - Rocephin and azithromycin initiated empirically in ED, will continue with these for now  - Check sputum culture, urine antigens for strep pneumo and legionella  - Trend lactate, procalcitonin  - Continuous pulse oximetry with titration of FiO2 to maintain sat >92%  - If fails to improve as expected, will reconsider alternative etiologies   2. Generalized anxiety disorder  - Exacerbated by the current illness  - Continue home-dose Lexapro  - Ativan 1 mg q8h prn okay for now  given exacerbation d/t acute illness with dyspnea   3. Pulmonary sarcoidosis  - Reportedly in remission and not requiring treatment in about 15 yrs  - Chronic lung changes appear stable on admission CXR  - Monitor    DVT ppx:  SQ Lovenox    Code Status: Full code Family Communication: None at bed side.               Disposition Plan: Admit to inpatient   Date of Service  01/26/2016    Vianne Bulls, MD [facility] Pager [phone]  If 7PM-7AM, please contact night-coverage [email or web address] Password [email or web address] 01/26/2016, 2:00 AM

## 2016-01-26 NOTE — ED Notes (Signed)
Attempted to call report to floor x 1. Left name & number for floor nurse to return call for report.

## 2016-01-26 NOTE — Progress Notes (Signed)
Patient continues to have low-grade temperature, on admission to the floor, temperature 100.3, and currently temperature is 100.0. Patient currently has no PRNs such as tylenol ordered in case temperature elevates. Notified hospitalist via text page to possibly put in order for an elevated temperature. Though patient's temperature is at 100.0, patient feels very hot. Will continue to monitor patient to end of shift and will report of to day nurse of this matter. Nurse signing off at this time.

## 2016-01-26 NOTE — Progress Notes (Signed)
Pharmacy Antibiotic Note  [name] [name] is a 55 y.o. male admitted on 01/25/2016 with Body aches/weakness.  Pharmacy has been consulted for Ceftriaxone dosing.  Possible PNA per CXR  Plan: -Ceftriaxone 2g IV q24h -Azithromycin per MD -F/U infectious work-up   Temp (24hrs), Avg:99.7 F (37.6 C), Min:98.4 F (36.9 C), Max:100.9 F (38.3 C)   Recent Labs Lab 01/25/16 2350 01/26/16 0001 01/26/16 0003  WBC 5.8  --   --   CREATININE  --   --  1.10  LATICACIDVEN  --  2.21*  --     Estimated Creatinine Clearance: 66.5 mL/min (by C-G formula based on Cr of 1.1).    Allergies  Allergen Reactions  . Oxycodone Other (See Comments)    abd pain, stomach cramps    Narda Bonds 01/26/2016 2:11 AM

## 2016-01-26 NOTE — Consult Note (Signed)
[name] [name] came by room to visit with pt; pt was sleeping.  If pt still wants [facility] visit, please contact.  CH will follow up.

## 2016-01-26 NOTE — Progress Notes (Signed)
[facility] PROGRESS NOTE  [name] [name] [id number] DOB: 12/21/1960 DOA: 01/25/2016 PCP: Minerva Ends, MD  HPI/Brief narrative 55 y.o. 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 Acute respiratory failure with hypoxia secondary to CAP  - Pt had been suffering URI sxs for close to a month, now with apparent development of secondary bacterial PNA  - Rocephin and azithromycin was initiated empirically in ED - Thus far, urine pos for strep pneumo. Consider transition to oral regimen - Continuous pulse oximetry with titration of FiO2 to maintain sat >92%  - Clinically improving  2. Generalized anxiety disorder  - Exacerbated by the current illness  - Continue home-dose Lexapro  - Ativan 1 mg q8h prn okay for now given exacerbation d/t acute illness with dyspnea   3. Pulmonary sarcoidosis  - Reportedly in remission and not requiring treatment in about 15 yrs  - Chronic lung changes appear stable on admission CXR  - Stable thus far  4. DVT prophylaxis - SubQ lovenox  Code Status: Full Family Communication: Pt in room Disposition Plan: Possible d/c home in 24-48hrs   Consultants:    Procedures:    Antibiotics: Anti-infectives    Start     Dose/Rate Route Frequency Ordered Stop   01/26/16 2300  azithromycin (ZITHROMAX) 500 mg in dextrose 5 % 250 mL IVPB     500 mg 250 mL/hr over 60 Minutes Intravenous Every 24 hours 01/26/16 0159     01/26/16 2200  cefTRIAXone (ROCEPHIN) 2 g in dextrose 5 % 50 mL IVPB     2 g 100 mL/hr over 30 Minutes Intravenous Every 24 hours 01/26/16 0210     01/25/16 2300  cefTRIAXone (ROCEPHIN) 1 g in dextrose 5 % 50 mL IVPB     1 g 100 mL/hr over 30 Minutes Intravenous  Once 01/25/16 2256 01/26/16  0135   01/25/16 2300  azithromycin (ZITHROMAX) tablet 500 mg     500 mg Oral  Once 01/25/16 2256 01/26/16 0004      HPI/Subjective: Feeling and breathing better today  Objective: Filed Vitals:   01/26/16 0400 01/26/16 0840 01/26/16 0940 01/26/16 1115  BP: 97/52 99/54 101/53 108/59  Pulse: 101 100 99 102  Temp: 100.3 F (37.9 C) 99.4 F (37.4 C) 99.9 F (37.7 C) 99.2 F (37.3 C)  TempSrc:  Oral  Oral  Resp: 24 16 18 16   Height: 5\' 9"  (1.753 m)     Weight: 62.279 kg (137 lb 4.8 oz)     SpO2: 97% 93% 95% 100%    Intake/Output Summary (Last 24 hours) at 01/26/16 1415 Last data filed at 01/26/16 1332  Gross per 24 hour  Intake    700 ml  Output    675 ml  Net     25 ml   Filed Weights   01/26/16 0400  Weight: 62.279 kg (137 lb 4.8 oz)    Exam:   General:  Awake, in nad  Cardiovascular: regular, s1, s2  Respiratory: normal resp effort, no wheezing  Abdomen: soft,nondistended  Musculoskeletal: perfused, no clubbing   Data Reviewed: Basic Metabolic Panel:  Recent Labs Lab 01/26/16 0003  NA 140  K 4.0  CL 101  GLUCOSE 84  BUN 19  CREATININE 1.10   Liver Function Tests: No results for  input(s): AST, ALT, ALKPHOS, BILITOT, PROT, ALBUMIN in the last 168 hours. No results for input(s): LIPASE, AMYLASE in the last 168 hours. No results for input(s): AMMONIA in the last 168 hours. CBC:  Recent Labs Lab 01/25/16 2350 01/26/16 0003  WBC 5.8  --   NEUTROABS 4.9  --   HGB 12.0* 13.9  HCT 38.6* 41.0  MCV 79.8  --   PLT 153  --    Cardiac Enzymes:  Recent Labs Lab 01/26/16 0246 01/26/16 0758  TROPONINI <0.03 <0.03   BNP (last 3 results)  Recent Labs  01/26/16 0246  BNP 51.6    ProBNP (last 3 results) No results for input(s): PROBNP in the last 8760 hours.  CBG: No results for input(s): GLUCAP in the last 168 hours.  No results found for this or any previous visit (from the past 240 hour(s)).   Studies: Dg Chest 2 View  01/25/2016   CLINICAL DATA:  55 year old male with cough. History of sarcoidosis. EXAM: CHEST  2 VIEW COMPARISON:  Radiograph dated 12/27/2015 FINDINGS: Two views of the chest demonstrate emphysematous changes of the lungs with areas of biapical scarring. There is upward retraction of the hila bilaterally compatible with history of sarcoidosis. There is diffuse interstitial prominence similar to prior study. A focal area of apparent increased density in the left lung base noted which is more prominent compared to the prior study and may represent developing pneumonia. Clinical correlation is recommended. There is no pleural effusion or pneumothorax. The cardiac silhouette is within normal limits. The osseous structures are grossly unremarkable. IMPRESSION: Chronic lung disease with areas of scarring compatible with known sarcoidosis. A focal area of slightly increased density at the left lung base may represent developing pneumonia. Clinical correlation and follow-up recommended. Electronically Signed   By: Anner Crete M.D.   On: 01/25/2016 23:48    Scheduled Meds: . azithromycin  500 mg Intravenous Q24H  . cefTRIAXone (ROCEPHIN)  IV  2 g Intravenous Q24H  . enoxaparin (LOVENOX) injection  40 mg Subcutaneous Q24H  . escitalopram  10 mg Oral Daily  . feeding supplement (ENSURE ENLIVE)  237 mL Oral BID BM  . fluticasone  2 spray Each Nare Daily  . loratadine  10 mg Oral Daily   Continuous Infusions:    Principal Problem:   Acute respiratory failure with hypoxia (HCC) Active Problems:   Generalized anxiety disorder   Alcohol abuse, in remission   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[place])   CAP (community acquired pneumonia)   Sepsis, unspecified organism ([place])    CHIU, [facility] Pager [phone]. If 7PM-7AM, please contact night-coverage at [email or web address], password [facility] 01/26/2016, 2:15 PM  LOS: 0 days

## 2016-01-27 LAB — RESPIRATORY VIRUS PANEL
Adenovirus: NEGATIVE
Influenza A: NEGATIVE
Influenza B: NEGATIVE
Metapneumovirus: NEGATIVE
Parainfluenza 1: NEGATIVE
Parainfluenza 2: NEGATIVE
Parainfluenza 3: NEGATIVE
Respiratory Syncytial Virus A: NEGATIVE
Respiratory Syncytial Virus B: NEGATIVE
Rhinovirus: NEGATIVE

## 2016-01-27 LAB — BASIC METABOLIC PANEL
Anion gap: 6 (ref 5–15)
BUN: 12 mg/dL (ref 6–20)
CO2: 27 mmol/L (ref 22–32)
Calcium: 8.3 mg/dL — ABNORMAL LOW (ref 8.9–10.3)
Chloride: 105 mmol/L (ref 101–111)
Creatinine, Ser: 1.03 mg/dL (ref 0.61–1.24)
GFR calc Af Amer: >60 mL/min (ref >60)
GFR calc non Af Amer: >60 mL/min (ref >60)
Glucose, Bld: 101 mg/dL — ABNORMAL HIGH (ref 65–99)
Potassium: 4 mmol/L (ref 3.5–5.1)
Sodium: 138 mmol/L (ref 135–145)

## 2016-01-27 LAB — LEGIONELLA ANTIGEN, URINE

## 2016-01-27 LAB — CBC
HCT: 34.9 % — ABNORMAL LOW (ref 39.0–52.0)
Hemoglobin: 10.8 g/dL — ABNORMAL LOW (ref 13.0–17.0)
MCH: 24.8 pg — ABNORMAL LOW (ref 26.0–34.0)
MCHC: 30.9 g/dL (ref 30.0–36.0)
MCV: 80 fL (ref 78.0–100.0)
Platelets: 130 10*3/uL — ABNORMAL LOW (ref 150–400)
RBC: 4.36 MIL/uL (ref 4.22–5.81)
RDW: 16.2 % — ABNORMAL HIGH (ref 11.5–15.5)
WBC: 13.5 10*3/uL — ABNORMAL HIGH (ref 4.0–10.5)

## 2016-01-27 MED ORDER — POLYETHYLENE GLYCOL 3350 17 G PO PACK
17.0000 g | PACK | Freq: Every day | ORAL | Status: DC
  Administered 2016-01-27: 17 g via ORAL
  Filled 2016-01-27 (×2): qty 1

## 2016-01-27 MED ORDER — ALPRAZOLAM 0.25 MG PO TABS: 0.2500 mg | ORAL_TABLET | Freq: Two times a day (BID) | ORAL | Status: DC | PRN

## 2016-01-27 MED ORDER — DOCUSATE SODIUM 50 MG/5ML PO LIQD
100.0000 mg | Freq: Two times a day (BID) | ORAL | Status: DC
  Administered 2016-01-27 (×2): 100 mg via ORAL
  Filled 2016-01-27 (×6): qty 10

## 2016-01-27 MED ORDER — BENZONATATE 100 MG PO CAPS
100.0000 mg | ORAL_CAPSULE | Freq: Three times a day (TID) | ORAL | Status: DC | PRN
  Administered 2016-01-27 (×2): 100 mg via ORAL
  Filled 2016-01-27 (×2): qty 1

## 2016-01-27 NOTE — Progress Notes (Signed)
[facility] PROGRESS NOTE  [name] [name] [id number] DOB: [birth year] DOA: 01/25/2016 PCP: Minerva Ends, MD  HPI/Brief narrative 55 y.o. 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 Acute respiratory failure with hypoxia secondary to CAP  - Pt had been suffering URI sxs for close to a month, now with apparent development of secondary bacterial PNA  - Rocephin and azithromycin was initiated empirically in ED - WBC up to over 13 this AM although pt claims to feel improved - Thus far, urine pos for strep pneumo. Consider transition to oral regimen when leukocytosis trends down - Continuous pulse oximetry with titration of FiO2 to maintain sat >92%  - Overall clinically improving - flutter valve was ordered 3/1  2. Generalized anxiety disorder  - Exacerbated by the current illness  - Continue home-dose Lexapro  - Ativan 1 mg q8h prn okay for now given exacerbation d/t acute illness with dyspnea   3. Pulmonary sarcoidosis  - Reportedly in remission and not requiring treatment in about 15 yrs  - Chronic lung changes appear stable on admission CXR  - Stable thus far  4. DVT prophylaxis - SubQ lovenox  Code Status: Full Family Communication: Pt in room Disposition Plan: Possible d/c home in 24-48hrs   Consultants:    Procedures:    Antibiotics: Anti-infectives    Start     Dose/Rate Route Frequency Ordered Stop   01/26/16 2300  azithromycin (ZITHROMAX) 500 mg in dextrose 5 % 250 mL IVPB     500 mg 250 mL/hr over 60 Minutes Intravenous Every 24 hours 01/26/16 0159     01/26/16 2200  cefTRIAXone (ROCEPHIN) 2 g in dextrose 5 % 50 mL IVPB     2 g 100 mL/hr over 30 Minutes Intravenous Every 24 hours 01/26/16 0210     01/25/16 2300   cefTRIAXone (ROCEPHIN) 1 g in dextrose 5 % 50 mL IVPB     1 g 100 mL/hr over 30 Minutes Intravenous  Once 01/25/16 2256 01/26/16 0135   01/25/16 2300  azithromycin (ZITHROMAX) tablet 500 mg     500 mg Oral  Once 01/25/16 2256 01/26/16 0004      HPI/Subjective: States feeling better today  Objective: Filed Vitals:   01/26/16 2117 01/27/16 0518 01/27/16 0758 01/27/16 1137  BP: 111/62 119/73 119/71 114/65  Pulse: 88 80 78 96  Temp: 98.4 F (36.9 C) 99.3 F (37.4 C) 99.3 F (37.4 C) 99.5 F (37.5 C)  TempSrc: Oral Oral Oral Oral  Resp: 18 20 18 18   Height:      Weight:  63.3 kg (139 lb 8.8 oz)    SpO2: 100% 96% 100% 95%    Intake/Output Summary (Last 24 hours) at 01/27/16 1409 Last data filed at 01/27/16 1408  Gross per 24 hour  Intake   1400 ml  Output    450 ml  Net    950 ml   Filed Weights   01/26/16 0400 01/27/16 0518  Weight: 62.279 kg (137 lb 4.8 oz) 63.3 kg (139 lb 8.8 oz)    Exam:   General:  Awake, in nad  Cardiovascular: regular, s1, s2  Respiratory: normal resp effort, no wheezing  Abdomen: soft,nondistended, pos BS  Musculoskeletal: perfused, no clubbing, no cyanosis  Data Reviewed: Basic Metabolic Panel:  Recent  Labs Lab 01/26/16 0003 01/27/16 0345  NA 140 138  K 4.0 4.0  CL 101 105  CO2  --  27  GLUCOSE 84 101*  BUN 19 12  CREATININE 1.10 1.03  CALCIUM  --  8.3*   Liver Function Tests: No results for input(s): AST, ALT, ALKPHOS, BILITOT, PROT, ALBUMIN in the last 168 hours. No results for input(s): LIPASE, AMYLASE in the last 168 hours. No results for input(s): AMMONIA in the last 168 hours. CBC:  Recent Labs Lab 01/25/16 2350 01/26/16 0003 01/27/16 0345  WBC 5.8  --  13.5*  NEUTROABS 4.9  --   --   HGB 12.0* 13.9 10.8*  HCT 38.6* 41.0 34.9*  MCV 79.8  --  80.0  PLT 153  --  130*   Cardiac Enzymes:  Recent Labs Lab 01/26/16 0246 01/26/16 0758 01/26/16 1337  TROPONINI <0.03 <0.03 <0.03   BNP (last 3  results)  Recent Labs  01/26/16 0246  BNP 51.6    ProBNP (last 3 results) No results for input(s): PROBNP in the last 8760 hours.  CBG: No results for input(s): GLUCAP in the last 168 hours.  No results found for this or any previous visit (from the past 240 hour(s)).   Studies: Dg Chest 2 View  01/25/2016  CLINICAL DATA:  55 year old male with cough. History of sarcoidosis. EXAM: CHEST  2 VIEW COMPARISON:  Radiograph dated 12/27/2015 FINDINGS: Two views of the chest demonstrate emphysematous changes of the lungs with areas of biapical scarring. There is upward retraction of the hila bilaterally compatible with history of sarcoidosis. There is diffuse interstitial prominence similar to prior study. A focal area of apparent increased density in the left lung base noted which is more prominent compared to the prior study and may represent developing pneumonia. Clinical correlation is recommended. There is no pleural effusion or pneumothorax. The cardiac silhouette is within normal limits. The osseous structures are grossly unremarkable. IMPRESSION: Chronic lung disease with areas of scarring compatible with known sarcoidosis. A focal area of slightly increased density at the left lung base may represent developing pneumonia. Clinical correlation and follow-up recommended. Electronically Signed   By: Anner Crete M.D.   On: 01/25/2016 23:48    Scheduled Meds: . azithromycin  500 mg Intravenous Q24H  . cefTRIAXone (ROCEPHIN)  IV  2 g Intravenous Q24H  . docusate  100 mg Oral BID  . enoxaparin (LOVENOX) injection  40 mg Subcutaneous Q24H  . escitalopram  10 mg Oral Daily  . feeding supplement (ENSURE ENLIVE)  237 mL Oral BID BM  . fluticasone  2 spray Each Nare Daily  . loratadine  10 mg Oral Daily  . polyethylene glycol  17 g Oral Daily   Continuous Infusions:    Principal Problem:   Acute respiratory failure with hypoxia (HCC) Active Problems:   Generalized anxiety disorder    Alcohol abuse, in remission   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[place])   CAP (community acquired pneumonia)   Sepsis, unspecified organism ([place])    CHIU, [place] [facility] Pager [phone]. If 7PM-7AM, please contact night-coverage at [email or web address], password [facility] 01/27/2016, 2:09 PM  LOS: 1 day

## 2016-01-27 NOTE — Clinical Documentation Improvement (Signed)
Internal Medicine  Please clarify if Sepsis ruled in or out as conflicting documentation noted in chart. Document findings in next progress note NOT in BPA drop down box. Thank you.   Sepsis  Other  Clinically Undetermined  Supporting Information:  WBC = 13.5; patient is tachycardic, febrile  Serial Lactates: 2.21, 1.1, 1.2  Source of infection - pneumonia  Organ failure - acute hypoxic respiratory failure  Please exercise your independent, professional judgment when responding. A specific answer is not anticipated or expected.  Thank You, Zoila Shutter RN, BSN, [place] [phone]; Cell: [phone]

## 2016-01-27 NOTE — Progress Notes (Signed)
Pt took off from droplet isolation, respiratory panel virus result was negative. Will continue to monitor pt.

## 2016-01-28 DIAGNOSIS — A419 Sepsis, unspecified organism: Principal | ICD-10-CM

## 2016-01-28 LAB — CBC
HCT: 33.9 % — ABNORMAL LOW (ref 39.0–52.0)
Hemoglobin: 10.3 g/dL — ABNORMAL LOW (ref 13.0–17.0)
MCH: 24 pg — ABNORMAL LOW (ref 26.0–34.0)
MCHC: 30.4 g/dL (ref 30.0–36.0)
MCV: 78.8 fL (ref 78.0–100.0)
Platelets: 147 10*3/uL — ABNORMAL LOW (ref 150–400)
RBC: 4.3 MIL/uL (ref 4.22–5.81)
RDW: 16.1 % — ABNORMAL HIGH (ref 11.5–15.5)
WBC: 10.7 10*3/uL — ABNORMAL HIGH (ref 4.0–10.5)

## 2016-01-28 LAB — BASIC METABOLIC PANEL
Anion gap: 8 (ref 5–15)
BUN: 9 mg/dL (ref 6–20)
CO2: 29 mmol/L (ref 22–32)
Calcium: 8.9 mg/dL (ref 8.9–10.3)
Chloride: 100 mmol/L — ABNORMAL LOW (ref 101–111)
Creatinine, Ser: 1 mg/dL (ref 0.61–1.24)
GFR calc Af Amer: >60 mL/min (ref >60)
GFR calc non Af Amer: >60 mL/min (ref >60)
Glucose, Bld: 142 mg/dL — ABNORMAL HIGH (ref 65–99)
Potassium: 3.7 mmol/L (ref 3.5–5.1)
Sodium: 137 mmol/L (ref 135–145)

## 2016-01-28 LAB — PROCALCITONIN: Procalcitonin: 8.33 ng/mL

## 2016-01-28 MED ORDER — CEFUROXIME AXETIL 250 MG PO TABS: 250.0000 mg | ORAL_TABLET | Freq: Two times a day (BID) | ORAL | Status: DC

## 2016-01-28 MED ORDER — SALINE SPRAY 0.65 % NA SOLN
1.0000 | NASAL | Status: DC | PRN
  Filled 2016-01-28: qty 44

## 2016-01-28 MED ORDER — BENZONATATE 100 MG PO CAPS: 100.0000 mg | ORAL_CAPSULE | Freq: Three times a day (TID) | ORAL | Status: DC | PRN

## 2016-01-28 MED ORDER — AZITHROMYCIN 250 MG PO TABS: ORAL_TABLET | ORAL | Status: DC

## 2016-01-28 MED FILL — ?AZITHROMYCIN 250 MG TABLET: 250 MG | 5 days supply | Qty: 5 | Fill #0

## 2016-01-28 MED FILL — BENZONATATE 100 MG CAPSULE: 100 | 6 days supply | Qty: 20 | Fill #0

## 2016-01-28 NOTE — Discharge Summary (Signed)
Physician Discharge Summary  Brendt Ocon [id number] DOB: 02-23-1961 DOA: 01/25/2016  PCP: Minerva Ends, MD  Admit date: 01/25/2016 Discharge date: 01/28/2016  Time spent: 20 minutes  Recommendations for Outpatient Follow-up:  1. Follow up with PCP in 1-2 weeks   Discharge Diagnoses:  Principal Problem:   Acute respiratory failure with hypoxia ([place]) Active Problems:   Generalized anxiety disorder   Alcohol abuse, in remission   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[place])   CAP (community acquired pneumonia)   Sepsis, unspecified organism [facility])   Discharge Condition: Improved  Diet recommendation: Regular  Filed Weights   01/26/16 0400 01/27/16 0518 01/28/16 0442  Weight: 62.279 kg (137 lb 4.8 oz) 63.3 kg (139 lb 8.8 oz) 60.873 kg (134 lb 3.2 oz)    History of present illness:  Please review dictated H and P from 2/28 for details. Briefly, 55 y.o. 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  Hospital Course:  1. Acute respiratory failure with hypoxia secondary to CAP  - Pt had been suffering URI sxs for close to a month, now with apparent development of secondary bacterial PNA  - Rocephin and azithromycin was initiated empirically in ED - WBC peaked to 13k, improved to 10.7k - Thus far, urine pos for strep pneumo. - Patient required min O2 support - Transitioned to PO azithromycin and ceftin to complete course of abx  2. Generalized anxiety disorder  - Exacerbated by the current illness  - Continue home-dose Lexapro  - Ativan 1 mg q8h prn okay for now given exacerbation d/t acute illness with dyspnea   3. Pulmonary sarcoidosis  - Reportedly in remission and not requiring treatment in about 15 yrs  - Chronic lung changes appear stable on admission CXR  - Stable  thus far  4. DVT prophylaxis - SubQ lovenox while admitted  Discharge Exam: Filed Vitals:   01/27/16 1137 01/27/16 2128 01/28/16 0442 01/28/16 0903  BP: 114/65 121/83 116/77 124/78  Pulse: 96 67 70 72  Temp: 99.5 F (37.5 C) 98.3 F (36.8 C) 99.1 F (37.3 C) 98.6 F (37 C)  TempSrc: Oral Oral Oral Oral  Resp: 18 18 18 18   Height:      Weight:   60.873 kg (134 lb 3.2 oz)   SpO2: 95% 96% 95% 98%    General: Awake, in nad Cardiovascular: regular, s1, s2 Respiratory: normal resp effort, no wheezing  Discharge Instructions     Medication List    STOP taking these medications        naproxen 500 MG tablet  Commonly known as:  NAPROSYN     pantoprazole 40 MG tablet  Commonly known as:  PROTONIX      TAKE these medications        azithromycin 250 MG tablet  Commonly known as:  ZITHROMAX  1 tab po qday x 5 days     benzonatate 100 MG capsule  Commonly known as:  TESSALON  Take 1 capsule (100 mg total) by mouth 3 (three) times daily as needed for cough.     cefUROXime 250 MG tablet  Commonly known as:  CEFTIN  Take 1 tablet (250 mg total) by mouth 2 (two) times daily with a meal.     cetirizine 10 MG tablet  Commonly known as:  ZYRTEC  Take 1  tablet (10 mg total) by mouth daily.     escitalopram 10 MG tablet  Commonly known as:  LEXAPRO  Take 1 tablet (10 mg total) by mouth daily.     fluticasone 50 MCG/ACT nasal spray  Commonly known as:  FLONASE  Place 2 sprays into both nostrils daily.     ibuprofen 800 MG tablet  Commonly known as:  ADVIL,MOTRIN  Take 1 tablet (800 mg total) by mouth every 8 (eight) hours as needed.     ketoconazole 2 % shampoo  Commonly known as:  NIZORAL  Apply 1 application topically 2 (two) times a week. Apply to scalp       Allergies  Allergen Reactions  . Oxycodone Other (See Comments)    abd pain, stomach cramps    Follow-up Information    Follow up with Minerva Ends, MD. Go on 02/11/2016.   Specialty:  Family  Medicine   Why:  Hospital follow up@3 :00pm   Contact information:   [place] [address] [phone]        The results of significant diagnostics from this hospitalization (including imaging, microbiology, ancillary and laboratory) are listed below for reference.    Significant Diagnostic Studies: Dg Chest 2 View  01/25/2016  CLINICAL DATA:  55 year old male with cough. History of sarcoidosis. EXAM: CHEST  2 VIEW COMPARISON:  Radiograph dated 12/27/2015 FINDINGS: Two views of the chest demonstrate emphysematous changes of the lungs with areas of biapical scarring. There is upward retraction of the hila bilaterally compatible with history of sarcoidosis. There is diffuse interstitial prominence similar to prior study. A focal area of apparent increased density in the left lung base noted which is more prominent compared to the prior study and may represent developing pneumonia. Clinical correlation is recommended. There is no pleural effusion or pneumothorax. The cardiac silhouette is within normal limits. The osseous structures are grossly unremarkable. IMPRESSION: Chronic lung disease with areas of scarring compatible with known sarcoidosis. A focal area of slightly increased density at the left lung base may represent developing pneumonia. Clinical correlation and follow-up recommended. Electronically Signed   By: Anner Crete M.D.   On: 01/25/2016 23:48    Microbiology: Recent Results (from the past 240 hour(s))  Respiratory virus antigens panel     Status: None   Collection Time: 01/26/16  5:04 AM  Result Value Ref Range Status   Source - [facility]AN NASAL SWAB  Corrected   Respiratory Syncytial Virus A Negative Negative Final   Respiratory Syncytial Virus B Negative Negative Final   Influenza A Negative Negative Final   Influenza B Negative Negative Final   Parainfluenza 1 Negative Negative Final   Parainfluenza 2 Negative Negative Final   Parainfluenza 3 Negative  Negative Final   Metapneumovirus Negative Negative Final   Rhinovirus Negative Negative Final   Adenovirus Negative Negative Final    Comment: (NOTE) Performed At: [facility] [place], [place] [id number] Lindon Romp MD [id number]      Labs: Basic Metabolic Panel:  Recent Labs Lab 01/26/16 0003 01/27/16 0345 01/28/16 0345  NA 140 138 137  K 4.0 4.0 3.7  CL 101 105 100*  CO2  --  27 29  GLUCOSE 84 101* 142*  BUN 19 12 9   CREATININE 1.10 1.03 1.00  CALCIUM  --  8.3* 8.9   Liver Function Tests: No results for input(s): AST, ALT, ALKPHOS, BILITOT, PROT, ALBUMIN in the last 168 hours. No results for input(s):  LIPASE, AMYLASE in the last 168 hours. No results for input(s): AMMONIA in the last 168 hours. CBC:  Recent Labs Lab 01/25/16 2350 01/26/16 0003 01/27/16 0345 01/28/16 0345  WBC 5.8  --  13.5* 10.7*  NEUTROABS 4.9  --   --   --   HGB 12.0* 13.9 10.8* 10.3*  HCT 38.6* 41.0 34.9* 33.9*  MCV 79.8  --  80.0 78.8  PLT 153  --  130* 147*   Cardiac Enzymes:  Recent Labs Lab 01/26/16 0246 01/26/16 0758 01/26/16 1337  TROPONINI <0.03 <0.03 <0.03   BNP: BNP (last 3 results)  Recent Labs  01/26/16 0246  BNP 51.6    ProBNP (last 3 results) No results for input(s): PROBNP in the last 8760 hours.  CBG: No results for input(s): GLUCAP in the last 168 hours.     Signed:  CHIU, STEPHEN K  [facility] 01/28/2016, 3:03 PM

## 2016-01-28 NOTE — Progress Notes (Signed)
Pt has orders to be discharged. Discharge instructions given and pt has no additional questions at this time. Medication regimen reviewed and pt educated. Pt verbalized understanding and has no additional questions. Telemetry box removed. IV removed and site in good condition. Pt stable and waiting for transportation.   Bethany Scherer RN 

## 2016-01-31 ENCOUNTER — Telehealth: Payer: Self-pay | Admitting: Family Medicine

## 2016-01-31 MED FILL — CEFUROXIME AXETIL 250 MG TA: 250 | 5 days supply | Qty: 10 | Fill #0

## 2016-01-31 NOTE — Telephone Encounter (Signed)
Pt. Called stating he was in the Hospital for pneumonia. Pt. Already has an appointment on 02/11/16 but pt. Stated he gas to be since before that date. Pt. Would like to speak to the nurse. Please f/u with pt.

## 2016-02-11 ENCOUNTER — Ambulatory Visit: Payer: BLUE CROSS/BLUE SHIELD | Attending: Family Medicine | Admitting: Family Medicine

## 2016-02-11 ENCOUNTER — Encounter: Payer: Self-pay | Admitting: Family Medicine

## 2016-02-11 VITALS — BP 121/75 | HR 74 | Temp 98.2°F | Resp 16 | Ht 69.0 in | Wt 134.0 lb

## 2016-02-11 DIAGNOSIS — L219 Seborrheic dermatitis, unspecified: Secondary | ICD-10-CM

## 2016-02-11 DIAGNOSIS — L720 Epidermal cyst: Secondary | ICD-10-CM

## 2016-02-11 DIAGNOSIS — J189 Pneumonia, unspecified organism: Secondary | ICD-10-CM

## 2016-02-11 DIAGNOSIS — J300 Vasomotor rhinitis: Secondary | ICD-10-CM

## 2016-02-11 DIAGNOSIS — J9601 Acute respiratory failure with hypoxia: Secondary | ICD-10-CM

## 2016-02-11 DIAGNOSIS — F411 Generalized anxiety disorder: Secondary | ICD-10-CM | POA: Diagnosis not present

## 2016-02-11 MED ORDER — ALBUTEROL SULFATE HFA 108 (90 BASE) MCG/ACT IN AERS: 2.0000 | INHALATION_SPRAY | Freq: Four times a day (QID) | RESPIRATORY_TRACT | Status: DC | PRN

## 2016-02-11 MED ORDER — ESCITALOPRAM OXALATE 10 MG PO TABS: 10.0000 mg | ORAL_TABLET | Freq: Every day | ORAL | Status: DC

## 2016-02-11 MED FILL — !VENTOLIN HFA INHALER: 108 (90 BAS | 25 days supply | Qty: 18 | Fill #0

## 2016-02-11 NOTE — Patient Instructions (Signed)
Tayt was seen today for hospitalization follow-up.  Diagnoses and all orders for this visit:  Seborrhea -     Ambulatory referral to Dermatology  Epidermal cyst -     Ambulatory referral to Dermatology  Generalized anxiety disorder -     escitalopram (LEXAPRO) 10 MG tablet; Take 1 tablet (10 mg total) by mouth daily.  Vasomotor rhinitis -     Ambulatory referral to ENT  CAP (community acquired pneumonia) -     albuterol (PROVENTIL HFA;VENTOLIN HFA) 108 (90 Base) MCG/ACT inhaler; Inhale 2 puffs into the lungs every 6 (six) hours as needed for wheezing or shortness of breath. -     DG Chest 2 View; Future   Please go around 03/07/16 for follow up chest x-ray   F/u in 8 weeks   Dr. Adrian Blackwater

## 2016-02-11 NOTE — Progress Notes (Signed)
Subjective:  Patient ID: [name] [name], male    DOB: 03-31-1961  Age: 55 y.o. MRN: [id number]  CC: Hospitalization Follow-up   HPI [name] [name] has hx of pulmonary sarcoidosis. ETOH abuse in remission. Anxiety on lexapro. Presents for    1. HFU: patient was hospitalized from 2/28-01/28/2016 for CAP after feeling faint and SOB while riding the bus home from work. He was treated with supplemental O2 and rochephin and azithromycin that was transitioned to ceftin and azithromycin upon discharge.  He has completed his oral antibiotics. He still has some cough with substernal chest tightness at times. He denies fever and chills.   2 Rhinorrhea: chronic. No improvement with flonase or zyrtec prescribed for presumed allergic rhinitis. rhinorhea is worse when he is active, especially at work. No headache, sneezing, itchy or watery eyes.   3. Derm referral: would like a referral for scalp cyst and seborrhea. He now has [employer].    Social History  Substance Use Topics  . Smoking status: Former Smoker    Types: Cigars  . Smokeless tobacco: Never Used     Comment: smokes occasional black &mild  . Alcohol Use: No    Outpatient Prescriptions Prior to Visit  Medication Sig Dispense Refill  . benzonatate (TESSALON) 100 MG capsule Take 1 capsule (100 mg total) by mouth 3 (three) times daily as needed for cough. 20 capsule 0  . escitalopram (LEXAPRO) 10 MG tablet Take 1 tablet (10 mg total) by mouth daily. 30 tablet 3  . fluticasone (FLONASE) 50 MCG/ACT nasal spray Place 2 sprays into both nostrils daily. 16 g 2  . ketoconazole (NIZORAL) 2 % shampoo Apply 1 application topically 2 (two) times a week. Apply to scalp 120 mL 3  . cetirizine (ZYRTEC) 10 MG tablet Take 1 tablet (10 mg total) by mouth daily. (Patient not taking: Reported on 02/11/2016) 30 tablet 11  . ibuprofen (ADVIL,MOTRIN) 800 MG tablet Take 1 tablet (800 mg total) by mouth every 8 (eight) hours as needed. (Patient not taking: Reported  on 01/26/2016) 21 tablet 0  . azithromycin (ZITHROMAX) 250 MG tablet 1 tab po qday x 5 days 5 tablet 0  . cefUROXime (CEFTIN) 250 MG tablet Take 1 tablet (250 mg total) by mouth 2 (two) times daily with a meal. 10 tablet 0   No facility-administered medications prior to visit.    ROS Review of Systems  Constitutional: Negative for fever, chills, fatigue and unexpected weight change.  HENT: Positive for rhinorrhea.   Eyes: Negative for visual disturbance.  Respiratory: Positive for cough and shortness of breath.   Cardiovascular: Negative for chest pain, palpitations and leg swelling.  Gastrointestinal: Negative for nausea, vomiting, abdominal pain, diarrhea, constipation and blood in stool.  Endocrine: Negative for polydipsia, polyphagia and polyuria.  Musculoskeletal: Negative for myalgias, back pain, arthralgias, gait problem and neck pain.  Skin: Negative for rash.  Allergic/Immunologic: Negative for immunocompromised state.  Hematological: Negative for adenopathy. Does not bruise/bleed easily.  Psychiatric/Behavioral: Negative for suicidal ideas, sleep disturbance and dysphoric mood. The patient is not nervous/anxious.     Objective:  BP 121/75 mmHg  Pulse 74  Temp(Src) 98.2 F (36.8 C) (Oral)  Resp 16  Ht 5\' 9"  (1.753 m)  Wt 134 lb (60.782 kg)  BMI 19.78 kg/m2  SpO2 99%  BP/Weight 02/11/2016 01/28/2016 99991111  Systolic BP 123XX123 [id number]  Diastolic BP 75 78 70  Wt. (Lbs) 134 134.2 135  BMI 19.78 19.81 19.93   Physical Exam  Constitutional:  He appears well-developed and well-nourished. No distress.  HENT:  Head: Normocephalic and atraumatic.  Neck: Normal range of motion. Neck supple.  Cardiovascular: Normal rate, regular rhythm, normal heart sounds and intact distal pulses.   Pulmonary/Chest: Effort normal. He has wheezes.  Musculoskeletal: He exhibits no edema.  Neurological: He is alert.  Skin: Skin is warm and dry. No rash noted. No erythema.  Psychiatric: He has a  normal mood and affect.   Treated with albuterol neb x one  Reported less chest tightness Wheezing had resolved on recheck   Assessment & Plan:   There are no diagnoses linked to this encounter. [name] [name] was seen today for hospitalization follow-up.  Diagnoses and all orders for this visit:  Seborrhea -     Ambulatory referral to Dermatology  Epidermal cyst -     Ambulatory referral to Dermatology  Generalized anxiety disorder -     escitalopram (LEXAPRO) 10 MG tablet; Take 1 tablet (10 mg total) by mouth daily.  Vasomotor rhinitis -     Ambulatory referral to ENT  CAP (community acquired pneumonia) -     albuterol (PROVENTIL HFA;VENTOLIN HFA) 108 (90 Base) MCG/ACT inhaler; Inhale 2 puffs into the lungs every 6 (six) hours as needed for wheezing or shortness of breath. -     DG Chest 2 View; Future   Meds ordered this encounter  Medications  . escitalopram (LEXAPRO) 10 MG tablet    Sig: Take 1 tablet (10 mg total) by mouth daily.    Dispense:  30 tablet    Refill:  5  . albuterol (PROVENTIL HFA;VENTOLIN HFA) 108 (90 Base) MCG/ACT inhaler    Sig: Inhale 2 puffs into the lungs every 6 (six) hours as needed for wheezing or shortness of breath.    Dispense:  1 Inhaler    Refill:  2    Follow-up: No Follow-up on file.   Boykin Nearing MD

## 2016-02-13 DIAGNOSIS — J300 Vasomotor rhinitis: Secondary | ICD-10-CM | POA: Insufficient documentation

## 2016-02-13 MED ORDER — OXYMETAZOLINE HCL 0.05 % NA SOLN: 1.0000 | Freq: Two times a day (BID) | NASAL | Status: DC

## 2016-02-13 NOTE — Assessment & Plan Note (Signed)
A: what was thought to be allergic rhinitis is now more likely vasomotor rhinitis P: Add afrin to be used sparingly ENT referral

## 2016-02-13 NOTE — Assessment & Plan Note (Signed)
Resolved

## 2016-02-13 NOTE — Assessment & Plan Note (Signed)
A; CAP, improved P: Albuterol inhaler for prn use F/u CXR ordered to be done around 03/07/16

## 2016-02-15 ENCOUNTER — Encounter: Payer: Self-pay | Admitting: Clinical

## 2016-02-15 NOTE — Progress Notes (Signed)
Depression screen [facility] 2/9 02/11/2016 01/21/2016 01/11/2016 09/14/2015 07/07/2015  Decreased Interest 0 0 0 0 0  Down, Depressed, Hopeless 0 0 0 0 0  PHQ - 2 Score 0 0 0 0 0  Altered sleeping 0 0 0 - -  Tired, decreased energy 1 1 0 - -  Change in appetite - 0 1 - -  Feeling bad or failure about yourself  0 0 0 - -  Trouble concentrating 0 0 0 - -  Moving slowly or fidgety/restless 0 0 0 - -  Suicidal thoughts 0 0 0 - -  PHQ-9 Score 1 1 1  - -    GAD 7 : Generalized Anxiety Score 02/11/2016 01/21/2016 01/11/2016  Nervous, Anxious, on Edge 1 0 0  Control/stop worrying 0 0 0  Worry too much - different things 0 0 0  Trouble relaxing 0 0 0  Restless 0 0 0  Easily annoyed or irritable 0 0 0  Afraid - awful might happen 0 0 0  Total GAD 7 Score 1 0 0

## 2016-02-21 MED FILL — ?ESCITALOPRAM 10 MG TABLET: 10 | 30 days supply | Qty: 30 | Fill #0

## 2016-02-22 MED FILL — TRIAMCINOLONE 0.025% CREAM: 0.025 | 15 days supply | Qty: 80 | Fill #0

## 2016-02-22 MED FILL — NAFTIN 2% CREAM: 2 | 15 days supply | Qty: 45 | Fill #0

## 2016-03-14 MED FILL — IPRATROPIUM 0.03% SPRAY: 0.03 | 30 days supply | Qty: 30 | Fill #0

## 2016-03-21 ENCOUNTER — Ambulatory Visit (HOSPITAL_COMMUNITY)
Admission: RE | Admit: 2016-03-21 | Discharge: 2016-03-21 | Disposition: A | Discharge status: Home / Self Care | Payer: BLUE CROSS/BLUE SHIELD | Source: Ambulatory Visit | Attending: Family Medicine | Admitting: Family Medicine

## 2016-03-21 DIAGNOSIS — J189 Pneumonia, unspecified organism: Secondary | ICD-10-CM | POA: Diagnosis not present

## 2016-03-21 DIAGNOSIS — M25562 Pain in left knee: Secondary | ICD-10-CM | POA: Diagnosis not present

## 2016-03-21 DIAGNOSIS — M25561 Pain in right knee: Secondary | ICD-10-CM

## 2016-03-21 MED FILL — ESCITALOPRAM 10 MG TABLET: 10 | 30 days supply | Qty: 30 | Fill #1

## 2016-03-22 ENCOUNTER — Telehealth: Payer: Self-pay | Admitting: *Deleted

## 2016-03-22 NOTE — Telephone Encounter (Signed)
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 Minimal in R knee.

## 2016-03-22 NOTE — Telephone Encounter (Signed)
-----   Message from Boykin Nearing, MD sent at 03/21/2016  2:04 PM EDT ----- Area of pneumonia on CXR has resolved

## 2016-03-22 NOTE — Telephone Encounter (Signed)
Date of birth verified by pt  Pneumonia resolved  Arthritis in the Lt knee minimal  in Rt knee  Pt verbalized understanding

## 2016-04-09 ENCOUNTER — Ambulatory Visit: Payer: Self-pay | Admitting: Surgery

## 2016-04-10 ENCOUNTER — Emergency Department (HOSPITAL_COMMUNITY): Payer: BLUE CROSS/BLUE SHIELD

## 2016-04-10 ENCOUNTER — Encounter (HOSPITAL_COMMUNITY): Payer: Self-pay | Admitting: Emergency Medicine

## 2016-04-10 DIAGNOSIS — Z8739 Personal history of other diseases of the musculoskeletal system and connective tissue: Secondary | ICD-10-CM | POA: Insufficient documentation

## 2016-04-10 DIAGNOSIS — J159 Unspecified bacterial pneumonia: Secondary | ICD-10-CM | POA: Diagnosis not present

## 2016-04-10 DIAGNOSIS — Z87891 Personal history of nicotine dependence: Secondary | ICD-10-CM | POA: Diagnosis not present

## 2016-04-10 DIAGNOSIS — K219 Gastro-esophageal reflux disease without esophagitis: Secondary | ICD-10-CM | POA: Diagnosis not present

## 2016-04-10 DIAGNOSIS — Z8639 Personal history of other endocrine, nutritional and metabolic disease: Secondary | ICD-10-CM | POA: Diagnosis not present

## 2016-04-10 DIAGNOSIS — Z79899 Other long term (current) drug therapy: Secondary | ICD-10-CM | POA: Diagnosis not present

## 2016-04-10 DIAGNOSIS — F419 Anxiety disorder, unspecified: Secondary | ICD-10-CM | POA: Diagnosis not present

## 2016-04-10 DIAGNOSIS — Z862 Personal history of diseases of the blood and blood-forming organs and certain disorders involving the immune mechanism: Secondary | ICD-10-CM | POA: Diagnosis not present

## 2016-04-10 DIAGNOSIS — F1010 Alcohol abuse, uncomplicated: Secondary | ICD-10-CM | POA: Diagnosis not present

## 2016-04-10 DIAGNOSIS — Z7951 Long term (current) use of inhaled steroids: Secondary | ICD-10-CM | POA: Diagnosis not present

## 2016-04-10 DIAGNOSIS — F329 Major depressive disorder, single episode, unspecified: Secondary | ICD-10-CM | POA: Diagnosis not present

## 2016-04-10 DIAGNOSIS — Z8669 Personal history of other diseases of the nervous system and sense organs: Secondary | ICD-10-CM | POA: Diagnosis not present

## 2016-04-10 LAB — COMPREHENSIVE METABOLIC PANEL
ALT: 14 U/L — ABNORMAL LOW (ref 17–63)
AST: 22 U/L (ref 15–41)
Albumin: 3.7 g/dL (ref 3.5–5.0)
Alkaline Phosphatase: 49 U/L (ref 38–126)
Anion gap: 9 (ref 5–15)
BUN: 5 mg/dL — ABNORMAL LOW (ref 6–20)
CO2: 25 mmol/L (ref 22–32)
Calcium: 9.1 mg/dL (ref 8.9–10.3)
Chloride: 102 mmol/L (ref 101–111)
Creatinine, Ser: 1 mg/dL (ref 0.61–1.24)
GFR calc Af Amer: >60 mL/min (ref >60)
GFR calc non Af Amer: >60 mL/min (ref >60)
Glucose, Bld: 90 mg/dL (ref 65–99)
Potassium: 4.1 mmol/L (ref 3.5–5.1)
Sodium: 136 mmol/L (ref 135–145)
Total Bilirubin: 0.4 mg/dL (ref 0.3–1.2)
Total Protein: 7.8 g/dL (ref 6.5–8.1)

## 2016-04-10 LAB — RAPID URINE DRUG SCREEN, HOSP PERFORMED
Amphetamines: NOT DETECTED
Barbiturates: NOT DETECTED
Benzodiazepines: NOT DETECTED
Cocaine: NOT DETECTED
Opiates: NOT DETECTED
Tetrahydrocannabinol: NOT DETECTED

## 2016-04-10 LAB — CBC
HCT: 37.6 % — ABNORMAL LOW (ref 39.0–52.0)
Hemoglobin: 12 g/dL — ABNORMAL LOW (ref 13.0–17.0)
MCH: 25.4 pg — ABNORMAL LOW (ref 26.0–34.0)
MCHC: 31.9 g/dL (ref 30.0–36.0)
MCV: 79.7 fL (ref 78.0–100.0)
Platelets: 215 10*3/uL (ref 150–400)
RBC: 4.72 MIL/uL (ref 4.22–5.81)
RDW: 15.1 % (ref 11.5–15.5)
WBC: 7.5 10*3/uL (ref 4.0–10.5)

## 2016-04-10 LAB — ETHANOL: Alcohol, Ethyl (B): 109 mg/dL — ABNORMAL HIGH (ref <5)

## 2016-04-10 NOTE — ED Notes (Signed)
Pt. requesting detox for his alcoholism , last drink 2 days ago , denies suicidal ideation / no hallucinations , pt. also reported productive cough , sneezing/nasal congestion and chest congestion for several days . Denies fever or chills.

## 2016-04-11 ENCOUNTER — Emergency Department (HOSPITAL_COMMUNITY)
Admission: EM | Admit: 2016-04-11 | Discharge: 2016-04-11 | Disposition: A | Discharge status: Home / Self Care | Payer: BLUE CROSS/BLUE SHIELD | Attending: Emergency Medicine | Admitting: Emergency Medicine

## 2016-04-11 DIAGNOSIS — J189 Pneumonia, unspecified organism: Secondary | ICD-10-CM

## 2016-04-11 DIAGNOSIS — IMO0002 Reserved for concepts with insufficient information to code with codable children: Secondary | ICD-10-CM

## 2016-04-11 MED ORDER — LEVOFLOXACIN 500 MG PO TABS
500.0000 mg | ORAL_TABLET | Freq: Once | ORAL | Status: AC
  Administered 2016-04-11: 500 mg via ORAL
  Filled 2016-04-11 (×2): qty 1

## 2016-04-11 MED ORDER — LEVOFLOXACIN 500 MG PO TABS: 500.0000 mg | ORAL_TABLET | Freq: Every day | ORAL | Status: DC

## 2016-04-11 MED ORDER — AZITHROMYCIN 250 MG PO TABS: 500.0000 mg | ORAL_TABLET | Freq: Once | ORAL | Status: DC

## 2016-04-11 MED ORDER — IBUPROFEN 200 MG PO TABS
600.0000 mg | ORAL_TABLET | Freq: Once | ORAL | Status: AC
  Administered 2016-04-11: 600 mg via ORAL
  Filled 2016-04-11: qty 3

## 2016-04-11 MED ORDER — CHLORDIAZEPOXIDE HCL 25 MG PO CAPS: ORAL_CAPSULE | ORAL | Status: DC

## 2016-04-11 NOTE — ED Provider Notes (Signed)
CSN: [id number]     Arrival date & time 04/10/16  2218 History  By signing my name below, I, Julien Nordmann, attest that this documentation has been prepared under the direction and in the presence of Quintella Reichert, MD. Electronically Signed: Julien Nordmann, ED Scribe. 04/11/2016. 3:34 AM.    Chief Complaint  Patient presents with  . Alcohol Problem      The history is provided by the patient. No language interpreter was used.   HPI Comments: [name] [name] is a 55 y.o. male who has a PMHx of sarcoidosis of the lung, reflux, pancreatitis, GERD, anxiety, depression, pneumonia, HLD, hepatitis and alcohol abuse presents to the Emergency Department complaining of a constant, gradual worsening, moderate shortness of breath onset. He reports having associated wheezing, nasal congestion, sneezing, loss of appetite and chest pain secondary to cough. He notes having a hx of walking pneumonia and he notes he is having similar symptoms currently. Pt also presents presenting with an alcohol problem. He says he relapsed two weeks ago after not drinking for a year. He says he is not in the currently in the position to receive treatment but he would like medication to detox what is currently in him now. He denies chills and hx of seizures.   Past Medical History  Diagnosis Date  . Sarcoidosis of lung ([place]) Dx 1989  . Reflux Dx 2007  . Pancreatitis Dx 2014  . Anxiety     About 55 years of age  . Depression     At 55 years of age  . GERD (gastroesophageal reflux disease) Dx 2007  . Carpal tunnel syndrome of right wrist Dx 2015  . Alcohol abuse     Last Drink on Sep 2015  . Pneumonia 11/2014  . Hepatitis, alcoholic, acute 123456  . Hyperlipidemia     borderline, diet controlled  . 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 Knee arthroscopy    . Carpel tunnel release Right 07/28/2014     done in [place]   . Elbow surgery    . Knee  surgery Left     15 years ago   Family History  Problem Relation Age of Onset  . Diabetes Father   . Hypertension Father   . Hypertension Paternal Grandfather   . Alcohol abuse Paternal Grandfather   . Alcohol abuse Maternal Grandfather   . Alcohol abuse Maternal Grandmother   . Alcohol abuse Paternal Grandmother   . Colon cancer Neg Hx   . Rectal cancer Neg Hx   . Stomach cancer Neg Hx    Social History  Substance Use Topics  . Smoking status: Former Smoker    Types: Cigars  . Smokeless tobacco: Never Used     Comment: smokes occasional black &mild  . Alcohol Use: No    Review of Systems  Constitutional: Positive for fever and appetite change. Negative for chills.  HENT: Positive for congestion and sneezing.   Respiratory: Positive for cough and wheezing.   Cardiovascular: Positive for chest pain (secondary to cough).  Neurological: Positive for headaches.  All other systems reviewed and are negative.     Allergies  Oxycodone  Home Medications   Prior to Admission medications   Medication Sig Start Date End Date Taking? Authorizing Provider  albuterol (PROVENTIL HFA;VENTOLIN HFA) 108 (90 Base) MCG/ACT inhaler Inhale 2 puffs into the lungs every 6 (six) hours as needed for wheezing or shortness of breath. 02/11/16  Yes Boykin Nearing, MD  escitalopram (LEXAPRO) 10 MG tablet Take 1 tablet (10 mg total) by mouth daily. 02/11/16  Yes Josalyn Funches, MD  fluticasone (FLONASE) 50 MCG/ACT nasal spray Place 2 sprays into both nostrils daily. 12/17/15  Yes Konrad Felix, PA  ipratropium (ATROVENT) 0.03 % nasal spray Place into the nose. 03/02/16  Yes Historical Provider, MD  ketoconazole (NIZORAL) 2 % shampoo Apply 1 application topically 2 (two) times a week. Apply to scalp 07/08/15  Yes Josalyn Funches, MD  oxymetazoline (AFRIN NASAL SPRAY) 0.05 % nasal spray Place 1 spray into both nostrils 2 (two) times daily. 02/13/16  Yes Josalyn Funches, MD  chlordiazePOXIDE (LIBRIUM) 25  MG capsule 50mg  PO TID x 1D, then 25-50mg  PO BID X 1D, then 25-50mg  PO QD X 1D 04/11/16   Quintella Reichert, MD  levofloxacin (LEVAQUIN) 500 MG tablet Take 1 tablet (500 mg total) by mouth daily. 04/11/16   Quintella Reichert, MD   BP 131/87 mmHg  Pulse 87  Temp(Src) 99.8 F (37.7 C) (Oral)  Resp 12  Ht 5\' 9"  (1.753 m)  Wt 139 lb (63.05 kg)  BMI 20.52 kg/m2  SpO2 95% Physical Exam  Constitutional: He is oriented to person, place, and time. He appears well-developed and well-nourished.  HENT:  Head: Normocephalic and atraumatic.  Cardiovascular: Normal rate and regular rhythm.   No murmur heard. Pulmonary/Chest: Effort normal. No respiratory distress. He has rhonchi.  Abdominal: Soft. There is no tenderness. There is no rebound and no guarding.  Musculoskeletal: He exhibits no edema or tenderness.  Neurological: He is alert and oriented to person, place, and time.  Skin: Skin is warm and dry.  Psychiatric: He has a normal mood and affect. His behavior is normal.  Nursing note and vitals reviewed.   ED Course  Procedures  DIAGNOSTIC STUDIES: Oxygen Saturation is 95% on RA, low by my interpretation.  COORDINATION OF CARE:  3:32 AM Discussed treatment plan which includes antibiotic with pt at bedside and pt agreed to plan.  Labs Review Labs Reviewed  COMPREHENSIVE METABOLIC PANEL - Abnormal; Notable for the following:    BUN 5 (*)    ALT 14 (*)    All other components within normal limits  ETHANOL - Abnormal; Notable for the following:    Alcohol, Ethyl (B) 109 (*)    All other components within normal limits  CBC - Abnormal; Notable for the following:    Hemoglobin 12.0 (*)    HCT 37.6 (*)    MCH 25.4 (*)    All other components within normal limits  URINE RAPID DRUG SCREEN, HOSP PERFORMED    Imaging Review Dg Chest 2 View  04/10/2016  CLINICAL DATA:  Cough, shortness of breath for 1 week, history sarcoidosis EXAM: CHEST  2 VIEW COMPARISON:  03/21/2016 FINDINGS: Normal heart  size. Volume loss knee upper lobes with superior retraction of the hila. Slight prominence of RIGHT paratracheal soft tissues is likely related to chronic adenopathy. Extensive parenchymal lung disease/scarring in the upper lobes with scattered interstitial prominence diffusely throughout remaining lungs as well. Emphysematous changes with new RIGHT basilar opacity question infiltrate. Linear scarring LEFT base. No gross pleural effusion or pneumothorax. Bones unremarkable. IMPRESSION: Emphysematous changes with BILATERAL upper lobe scarring/volume loss and chronic interstitial lung disease changes consistent with history of sarcoidosis. Opacity at RIGHT base suspicious for pneumonia though followup radiographs until resolution recommended to exclude underlying mass. Electronically Signed   By: Lavonia Dana M.D.   On: 04/10/2016 23:29  I have personally reviewed and evaluated these images and lab results as part of my medical decision-making.   EKG Interpretation None      MDM   Final diagnoses:  Alcohol use disorder ([place])  CAP (community acquired pneumonia)   Patient here for evaluation of cough and requesting alcohol detox. He is in no distress emergency Department. There are some occasional rhonchi on examination. Chest x-ray is concerning for developing pneumonia. Given patient's symptoms will treat with course of Levaquin. Also will treat for alcohol detox with outpatient Librium. Discussed home care for pneumonia as well as detox. Presentation is not consistent with CHF, PE.  I personally performed the services described in this documentation, which was scribed in my presence. The recorded information has been reviewed and is accurate.   Quintella Reichert, MD 04/11/16 [phone]

## 2016-04-11 NOTE — Discharge Instructions (Signed)
Alcohol Abuse and Nutrition Alcohol abuse is any pattern of alcohol consumption that harms your health, relationships, or work. Alcohol abuse can affect how your body breaks down and absorbs nutrients from food by causing your liver to work abnormally. Additionally, many people who abuse alcohol do not eat enough carbohydrates, protein, fat, vitamins, and minerals. This can cause poor nutrition (malnutrition) and a lack of nutrients (nutrient deficiencies), which can lead to further complications. Nutrients that are commonly lacking (deficient) among people who abuse alcohol include:  Vitamins.  Vitamin A. This is stored in your liver. It is important for your vision, metabolism, and ability to fight off infections (immunity).  B vitamins. These include vitamins such as folate, thiamin, and niacin. These are important in new cell growth and maintenance.  Vitamin C. This plays an important role in iron absorption, wound healing, and immunity.  Vitamin D. This is produced by your liver, but you can also get vitamin D from food. Vitamin D is necessary for your body to absorb and use calcium.  Minerals.  Calcium. This is important for your bones and your heart and blood vessel (cardiovascular) function.  Iron. This is important for blood, muscle, and nervous system functioning.  Magnesium. This plays an important role in muscle and nerve function, and it helps to control blood sugar and blood pressure.  Zinc. This is important for the normal function of your nervous system and digestive system (gastrointestinal tract). Nutrition is an essential component of therapy for alcohol abuse. Your health care provider or dietitian will work with you to design a plan that can help restore nutrients to your body and prevent potential complications. WHAT IS MY PLAN? Your dietitian may develop a specific diet plan that is based on your condition and any other complications you may have. A diet plan will  commonly include:  A balanced diet.  Grains: 6-8 oz per day.  Vegetables: 2-3 cups per day.  Fruits: 1-2 cups per day.  Meat and other protein: 5-6 oz per day.  Dairy: 2-3 cups per day.  Vitamin and mineral supplements. WHAT DO I NEED TO KNOW ABOUT ALCOHOL AND NUTRITION?  Consume foods that are high in antioxidants, such as grapes, berries, nuts, green tea, and dark green and orange vegetables. This can help to counteract some of the stress that is placed on your liver by consuming alcohol.  Avoid food and drinks that are high in fat and sugar. Foods such as sugared soft drinks, salty snack foods, and candy contain empty calories. This means that they lack important nutrients such as protein, fiber, and vitamins.  Eat frequent meals and snacks. Try to eat 5-6 small meals each day.  Eat a variety of fresh fruits and vegetables each day. This will help you get plenty of water, fiber, and vitamins in your diet.  Drink plenty of water and other clear fluids. Try to drink at least 48-64 oz (1.5-2 L) of water per day.  If you are a vegetarian, eat a variety of protein-rich foods. Pair whole grains with plant-based proteins at meals and snacks to obtain the greatest nutrient benefit from your food. For example, eat rice with beans, put peanut butter on whole-grain toast, or eat oatmeal with sunflower seeds.  Soak beans and whole grains overnight before cooking. This can help your body to absorb the nutrients more easily.  Include foods fortified with vitamins and minerals in your diet. Commonly fortified foods include milk, orange juice, cereal, and bread.  If you  are malnourished, your dietitian may recommend a high-protein, high-calorie diet. This may include:  2,000-3,000 calories (kilocalories) per day.  70-100 grams of protein per day.  Your health care provider may recommend a complete nutritional supplement beverage. This can help to restore calories, protein, and vitamins to  your body. Depending on your condition, you may be advised to consume this instead of or in addition to meals.  Limit your intake of caffeine. Replace drinks like coffee and black tea with decaffeinated coffee and herbal tea.  Eat a variety of foods that are high in omega fatty acids. These include fish, nuts and seeds, and soybeans. These foods may help your liver to recover and may also stabilize your mood.  Certain medicines may cause changes in your appetite, taste, and weight. Work with your health care provider and dietitian to make any adjustments to your medicines and diet plan.  Include other healthy lifestyle choices in your daily routine.  Be physically active.  Get enough sleep.  Spend time doing activities that you enjoy.  If you are unable to take in enough food and calories by mouth, your health care provider may recommend a feeding tube. This is a tube that passes through your nose and throat, directly into your stomach. Nutritional supplement beverages can be given to you through the feeding tube to help you get the nutrients you need.  Take vitamin or mineral supplements as recommended by your health care provider. WHAT FOODS CAN I EAT? Grains Enriched pasta. Enriched rice. Fortified whole-grain bread. Fortified whole-grain cereal. Barley. Brown rice. Quinoa. [place]. Vegetables All fresh, frozen, and canned vegetables. Spinach. Kale. Artichoke. Carrots. Winter squash and pumpkin. Sweet potatoes. Broccoli. Cabbage. Cucumbers. Tomatoes. Sweet peppers. Green beans. Peas. Corn. Fruits All fresh and frozen fruits. Berries. Grapes. Mango. Papaya. Guava. Cherries. Apples. Bananas. Peaches. Plums. Pineapple. Watermelon. Cantaloupe. Oranges. Avocado. Meats and Other Protein Sources Beef liver. Lean beef. Pork. Fresh and canned chicken. Fresh fish. Oysters. Sardines. Canned tuna. Shrimp. Eggs with yolks. Nuts and seeds. Peanut butter. Beans and lentils. Soybeans.  Tofu. Dairy Whole, low-fat, and nonfat milk. Whole, low-fat, and nonfat yogurt. Cottage cheese. Sour cream. Hard and soft cheeses. Beverages Water. Herbal tea. Decaffeinated coffee. Decaffeinated green tea. 100% fruit juice. 100% vegetable juice. Instant breakfast shakes. Condiments Ketchup. Mayonnaise. Mustard. Salad dressing. Barbecue sauce. Sweets and Desserts Sugar-free ice cream. Sugar-free pudding. Sugar-free gelatin. Fats and Oils Butter. Vegetable oil, flaxseed oil, olive oil, and walnut oil. Other Complete nutrition shakes. Protein bars. Sugar-free gum. The items listed above may not be a complete list of recommended foods or beverages. Contact your dietitian for more options. WHAT FOODS ARE NOT RECOMMENDED? Grains Sugar-sweetened breakfast cereals. Flavored instant oatmeal. Fried breads. Vegetables Breaded or deep-fried vegetables. Fruits Dried fruit with added sugar. Candied fruit. Canned fruit in syrup. Meats and Other Protein Sources Breaded or deep-fried meats. Dairy Flavored milks. Fried cheese curds or fried cheese sticks. Beverages Alcohol. Sugar-sweetened soft drinks. Sugar-sweetened tea. Caffeinated coffee and tea. Condiments Sugar. Honey. Agave nectar. Molasses. Sweets and Desserts Chocolate. Cake. Cookies. Candy. Other Potato chips. Pretzels. Salted nuts. Candied nuts. The items listed above may not be a complete list of foods and beverages to avoid. Contact your dietitian for more information.   This information is not intended to replace advice given to you by your health care provider. Make sure you discuss any questions you have with your health care provider.   Document Released: 09/07/2005 Document Revised: 12/04/2014 Document Reviewed: 06/16/2014 Elsevier Interactive Patient  Education 2016 [place] Pneumonia, Adult Pneumonia is an infection of the lungs. There are different types of pneumonia. One type can develop while a  person is in a hospital. A different type, called community-acquired pneumonia, develops in people who are not, or have not recently been, in the hospital or other health care facility.  CAUSES Pneumonia may be caused by bacteria, viruses, or funguses. Community-acquired pneumonia is often caused by Streptococcus pneumonia bacteria. These bacteria are often passed from one person to another by breathing in droplets from the cough or sneeze of an infected person. RISK FACTORS The condition is more likely to develop in:  People who havechronic diseases, such as chronic obstructive pulmonary disease (COPD), asthma, congestive heart failure, cystic fibrosis, diabetes, or kidney disease.  People who haveearly-stage or late-stage HIV.  People who havesickle cell disease.  People who havehad their spleen removed (splenectomy).  People who havepoor Human resources officer.  People who havemedical conditions that increase the risk of breathing in (aspirating) secretions their own mouth and nose.   People who havea weakened immune system (immunocompromised).  People who smoke.  People whotravel to areas where pneumonia-causing germs commonly exist.  People whoare around animal habitats or animals that have pneumonia-causing germs, including birds, bats, rabbits, cats, and farm animals. SYMPTOMS Symptoms of this condition include:  Adry cough.  A wet (productive) cough.  Fever.  Sweating.  Chest pain, especially when breathing deeply or coughing.  Rapid breathing or difficulty breathing.  Shortness of breath.  Shaking chills.  Fatigue.  Muscle aches. DIAGNOSIS Your health care provider will take a medical history and perform a physical exam. You may also have other tests, including:  Imaging studies of your chest, including X-rays.  Tests to check your blood oxygen level and other blood gases.  Other tests on blood, mucus (sputum), fluid around your lungs (pleural fluid),  and urine. If your pneumonia is severe, other tests may be done to identify the specific cause of your illness. TREATMENT The type of treatment that you receive depends on many factors, such as the cause of your pneumonia, the medicines you take, and other medical conditions that you have. For most adults, treatment and recovery from pneumonia may occur at home. In some cases, treatment must happen in a hospital. Treatment may include:  Antibiotic medicines, if the pneumonia was caused by bacteria.  Antiviral medicines, if the pneumonia was caused by a virus.  Medicines that are given by mouth or through an IV tube.  Oxygen.  Respiratory therapy. Although rare, treating severe pneumonia may include:  Mechanical ventilation. This is done if you are not breathing well on your own and you cannot maintain a safe blood oxygen level.  Thoracentesis. This procedureremoves fluid around one lung or both lungs to help you breathe better. HOME CARE INSTRUCTIONS  Take over-the-counter and prescription medicines only as told by your health care provider.  Only takecough medicine if you are losing sleep. Understand that cough medicine can prevent your body's natural ability to remove mucus from your lungs.  If you were prescribed an antibiotic medicine, take it as told by your health care provider. Do not stop taking the antibiotic even if you start to feel better.  Sleep in a semi-upright position at night. Try sleeping in a reclining chair, or place a few pillows under your head.  Do not use tobacco products, including cigarettes, chewing tobacco, and e-cigarettes. If you need help quitting, ask your health care provider.  Drink  enough water to keep your urine clear or pale yellow. This will help to thin out mucus secretions in your lungs. PREVENTION There are ways that you can decrease your risk of developing community-acquired pneumonia. Consider getting a pneumococcal vaccine if:  You are  older than 54 years of age.  You are older than 54 years of age and are undergoing cancer treatment, have chronic lung disease, or have other medical conditions that affect your immune system. Ask your health care provider if this applies to you. There are different types and schedules of pneumococcal vaccines. Ask your health care provider which vaccination option is best for you. You may also prevent community-acquired pneumonia if you take these actions:  Get an influenza vaccine every year. Ask your health care provider which type of influenza vaccine is best for you.  Go to the dentist on a regular basis.  Wash your hands often. Use hand sanitizer if soap and water are not available. SEEK MEDICAL CARE IF:  You have a fever.  You are losing sleep because you cannot control your cough with cough medicine. SEEK IMMEDIATE MEDICAL CARE IF:  You have worsening shortness of breath.  You have increased chest pain.  Your sickness becomes worse, especially if you are an older adult or have a weakened immune system.  You cough up blood.   This information is not intended to replace advice given to you by your health care provider. Make sure you discuss any questions you have with your health care provider.   Document Released: 11/13/2005 Document Revised: 08/04/2015 Document Reviewed: 03/10/2015 Elsevier Interactive Patient Education [employer].

## 2016-04-17 MED FILL — ESCITALOPRAM 10 MG TABLET: 10 | 30 days supply | Qty: 30 | Fill #2

## 2016-04-18 ENCOUNTER — Ambulatory Visit: Payer: Self-pay | Admitting: Surgery

## 2016-05-02 ENCOUNTER — Encounter: Payer: Self-pay | Admitting: Family Medicine

## 2016-05-02 ENCOUNTER — Ambulatory Visit: Payer: BLUE CROSS/BLUE SHIELD | Attending: Family Medicine | Admitting: Family Medicine

## 2016-05-02 VITALS — BP 135/75 | HR 69 | Temp 98.5°F | Resp 16 | Ht 69.0 in | Wt 139.0 lb

## 2016-05-02 DIAGNOSIS — M25561 Pain in right knee: Secondary | ICD-10-CM

## 2016-05-02 DIAGNOSIS — Z79899 Other long term (current) drug therapy: Secondary | ICD-10-CM | POA: Diagnosis not present

## 2016-05-02 DIAGNOSIS — J479 Bronchiectasis, uncomplicated: Secondary | ICD-10-CM | POA: Diagnosis not present

## 2016-05-02 DIAGNOSIS — D860 Sarcoidosis of lung: Secondary | ICD-10-CM

## 2016-05-02 DIAGNOSIS — F1010 Alcohol abuse, uncomplicated: Secondary | ICD-10-CM | POA: Diagnosis not present

## 2016-05-02 DIAGNOSIS — M25562 Pain in left knee: Secondary | ICD-10-CM | POA: Diagnosis not present

## 2016-05-02 DIAGNOSIS — J189 Pneumonia, unspecified organism: Secondary | ICD-10-CM | POA: Diagnosis not present

## 2016-05-02 DIAGNOSIS — F1011 Alcohol abuse, in remission: Secondary | ICD-10-CM

## 2016-05-02 DIAGNOSIS — Z87891 Personal history of nicotine dependence: Secondary | ICD-10-CM | POA: Insufficient documentation

## 2016-05-02 MED ORDER — METHYLPREDNISOLONE ACETATE 40 MG/ML IJ SUSP
40.0000 mg | Freq: Once | INTRAMUSCULAR | Status: AC
  Administered 2016-05-02: 40 mg via INTRA_ARTICULAR

## 2016-05-02 MED ORDER — CHLORDIAZEPOXIDE HCL 25 MG PO CAPS: ORAL_CAPSULE | ORAL | Status: DC

## 2016-05-02 NOTE — Progress Notes (Signed)
HFU pneumonia  C/C Knee pain bilateral, pain worst on rt knee  Pain scale # 8 Former smoker Last ETOH intake 04/30/2016

## 2016-05-02 NOTE — Progress Notes (Signed)
Subjective:  Patient ID: [name] [name], male    DOB: July 15, 1961  Age: 55 y.o. MRN: [id number]  CC: Follow-up; Pneumonia; and Alcohol Problem   HPI Eddy Moronta has hx of sarcoidosis, ETOH abuse, R medial meniscus tear s/p repair    1. ED f/u RLL pneumonia: he was seen in ED on 04/11/16 for cough and SOB. Also relapsing into alcohol use following a history of alcohol abuse and being sober for 1 year. He was prescribed Levaquin and librium. He completed the Levaquin.  He no longer has cough or SOB. This is his second episode of resp illness in the past 3 months, the first was in 12/2015 and required hospitalization for associated syncope.    2. Alcohol abuse: he relapsed into drinking beer over the weekends for about 3 weeks. He went to ED and requested medications for ETOH abuse. He took the librium but was not able to complete the written taper as he only received 10 tablets. He last drank two days ago. He denies tremor and seizures. He has nausea and loose stools in the AM.    3. Knee pain: bilateral knee pain. R >L currently. Pain improved with steroid injection done in March 2017. He has worsening pain and stiffness with ambulation.   Past Surgical History  Procedure Laterality Date  . Knee arthroscopy    . Carpel tunnel release Right 07/28/2014     done in [place]   . Elbow surgery    . Knee surgery Left     15 years ago    Social History  Substance Use Topics  . Smoking status: Former Smoker    Types: Cigars  . Smokeless tobacco: Never Used     Comment: smokes occasional black &mild  . Alcohol Use: No    Outpatient Prescriptions Prior to Visit  Medication Sig Dispense Refill  . albuterol (PROVENTIL HFA;VENTOLIN HFA) 108 (90 Base) MCG/ACT inhaler Inhale 2 puffs into the lungs every 6 (six) hours as needed for wheezing or shortness of breath. 1 Inhaler 2  . escitalopram (LEXAPRO) 10 MG tablet Take 1 tablet (10 mg total) by mouth daily. 30 tablet 5  . fluticasone (FLONASE) 50  MCG/ACT nasal spray Place 2 sprays into both nostrils daily. 16 g 2  . ipratropium (ATROVENT) 0.03 % nasal spray Place into the nose.    Marland Kitchen ketoconazole (NIZORAL) 2 % shampoo Apply 1 application topically 2 (two) times a week. Apply to scalp 120 mL 3  . oxymetazoline (AFRIN NASAL SPRAY) 0.05 % nasal spray Place 1 spray into both nostrils 2 (two) times daily. 30 mL 0  . chlordiazePOXIDE (LIBRIUM) 25 MG capsule 50mg  PO TID x 1D, then 25-50mg  PO BID X 1D, then 25-50mg  PO QD X 1D (Patient not taking: Reported on 05/02/2016) 10 capsule 0  . levofloxacin (LEVAQUIN) 500 MG tablet Take 1 tablet (500 mg total) by mouth daily. 7 tablet 0   No facility-administered medications prior to visit.    ROS Review of Systems  Constitutional: Negative for fever, chills, fatigue and unexpected weight change.  HENT: Negative for rhinorrhea.   Eyes: Negative for visual disturbance.  Respiratory: Negative for cough and shortness of breath.   Cardiovascular: Negative for chest pain, palpitations and leg swelling.  Gastrointestinal: Negative for nausea, vomiting, abdominal pain, diarrhea, constipation and blood in stool.  Endocrine: Negative for polydipsia, polyphagia and polyuria.  Musculoskeletal: Positive for arthralgias and gait problem. Negative for myalgias, back pain and neck pain.  Skin: Negative for rash.  Allergic/Immunologic: Negative for immunocompromised state.  Hematological: Negative for adenopathy. Does not bruise/bleed easily.  Psychiatric/Behavioral: Negative for suicidal ideas, sleep disturbance and dysphoric mood. The patient is not nervous/anxious.     Objective:  BP 135/75 mmHg  Pulse 69  Temp(Src) 98.5 F (36.9 C) (Oral)  Resp 16  Ht 5\' 9"  (1.753 m)  Wt 139 lb (63.05 kg)  BMI 20.52 kg/m2  SpO2 98%  BP/Weight 05/02/2016 04/11/2016 [id number]  Systolic BP A999333 0000000 -  Diastolic BP 75 89 -  Wt. (Lbs) 139 - 139  BMI 20.52 20.52 -   Wt Readings from Last 3 Encounters:  05/02/16 139 lb  (63.05 kg)  04/10/16 139 lb (63.05 kg)  02/11/16 134 lb (60.782 kg)    Physical Exam  Constitutional: He appears well-developed and well-nourished. No distress.  HENT:  Head: Normocephalic and atraumatic.  Neck: Normal range of motion. Neck supple.  Cardiovascular: Normal rate, regular rhythm, normal heart sounds and intact distal pulses.   Pulmonary/Chest: Effort normal. He has no wheezes.  Musculoskeletal: He exhibits no edema.       Right knee: He exhibits decreased range of motion. He exhibits no ecchymosis, no deformity and no erythema. Tenderness found.       Left knee: He exhibits decreased range of motion. He exhibits no ecchymosis, no deformity and no erythema. Tenderness found.  Neurological: He is alert.  Skin: Skin is warm and dry. No rash noted. No erythema.  Psychiatric: He has a normal mood and affect.   After obtaining informed consent and cleaning the skin using iodine and alcohol a  steroid injection was performed at both knees.  Using 1:1 , 1 ml of  1% plain Lidocaine and 40 mg/ml of Depo Medrol. This was well tolerated.   Assessment & Plan:   There are no diagnoses linked to this encounter.  Woodrow was seen today for follow-up, pneumonia and alcohol problem.  Diagnoses and all orders for this visit:  CAP (community acquired pneumonia) -     Ambulatory referral to Pulmonology -     DG Chest 2 View; Future  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[place]) -     Ambulatory referral to Pulmonology -     DG Chest 2 View; Future  Bronchiectasis without complication ([place]) -     Ambulatory referral to Pulmonology -     DG Chest 2 View; Future  Bilateral knee pain -     methylPREDNISolone acetate (DEPO-MEDROL) injection 40 mg; Inject 1 mL (40 mg total) into the articular space once. -     methylPREDNISolone acetate (DEPO-MEDROL) injection 40 mg; Inject 1 mL (40 mg total) into the articular space once. 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 methylPREDNISolone acetate (DEPO-MEDROL) injection 40 mg    Sig:   . methylPREDNISolone acetate (DEPO-MEDROL) injection 40 mg    Sig:   . chlordiazePOXIDE (LIBRIUM) 25 MG capsule    Sig: 25 mg PO TID x 1D, then 25 mg PO BID X 2 D, then 25 mg PO BID X 3 D    Dispense:  10 capsule    Refill:  0    Follow-up: No Follow-up on file.   Boykin Nearing MD

## 2016-05-02 NOTE — Patient Instructions (Addendum)
[name] [name] was seen today for follow-up, pneumonia and alcohol problem.  Diagnoses and all orders for this visit:  CAP (community acquired pneumonia) -     Ambulatory referral to Pulmonology -     DG Chest 2 View; Future  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[place]) -     Ambulatory referral to Pulmonology -     DG Chest 2 View; Future  Bronchiectasis without complication ([place]) -     Ambulatory referral to Pulmonology -     DG Chest 2 View; Future  Bilateral knee pain -     methylPREDNISolone acetate (DEPO-MEDROL) injection 40 mg; Inject 1 mL (40 mg total) into the articular space once. -     methylPREDNISolone acetate (DEPO-MEDROL) injection 40 mg; Inject 1 mL (40 mg total) into the articular space once. -     Ambulatory referral to Orthopedic Surgery  Mild alcohol abuse in early remission -     chlordiazePOXIDE (LIBRIUM) 25 MG capsule; 25 mg PO TID x 1D, then 25 mg PO BID X 2 D, then 25 mg PO BID X 3 D    You have received a shot of steroid in your joint today. Rest and ice knee today. Regular activity tomorrow. Look out for redness, swelling, fever,severe pain in joint and call if you experience these symptoms.  I have referred you back to pulmonology and orthopaedic surgery   Please complete the repeat chest x-ray between 05/22/16-06/05/16   F/u in 6 weeks for alcohol use and knee pain   Dr. Adrian Blackwater

## 2016-05-03 ENCOUNTER — Encounter (HOSPITAL_BASED_OUTPATIENT_CLINIC_OR_DEPARTMENT_OTHER): Payer: Self-pay | Admitting: *Deleted

## 2016-05-04 NOTE — Assessment & Plan Note (Signed)
Injections in both knees today Ortho referral

## 2016-05-04 NOTE — Assessment & Plan Note (Signed)
Resolved but concerning given second PNA in past 3 months ? Aspiration ? Sarcoidosis flare  Pulm referral placed F/u x-ray ordered

## 2016-05-04 NOTE — Assessment & Plan Note (Signed)
Librium taper Advised AA Patient feels confident in his ability to stop drinking

## 2016-05-10 ENCOUNTER — Encounter (HOSPITAL_BASED_OUTPATIENT_CLINIC_OR_DEPARTMENT_OTHER): Payer: Self-pay | Admitting: Anesthesiology

## 2016-05-10 ENCOUNTER — Ambulatory Visit (HOSPITAL_BASED_OUTPATIENT_CLINIC_OR_DEPARTMENT_OTHER)
Admission: RE | Admit: 2016-05-10 | Discharge: 2016-05-10 | Disposition: A | Discharge status: Home / Self Care | Payer: BLUE CROSS/BLUE SHIELD | Source: Ambulatory Visit | Attending: Surgery | Admitting: Surgery

## 2016-05-10 ENCOUNTER — Encounter (HOSPITAL_BASED_OUTPATIENT_CLINIC_OR_DEPARTMENT_OTHER)
Admission: RE | Disposition: A | Discharge status: Home / Self Care | Payer: Self-pay | Source: Ambulatory Visit | Attending: Surgery

## 2016-05-10 ENCOUNTER — Ambulatory Visit (HOSPITAL_BASED_OUTPATIENT_CLINIC_OR_DEPARTMENT_OTHER): Payer: BLUE CROSS/BLUE SHIELD | Admitting: Certified Registered"

## 2016-05-10 DIAGNOSIS — D170 Benign lipomatous neoplasm of skin and subcutaneous tissue of head, face and neck: Secondary | ICD-10-CM | POA: Insufficient documentation

## 2016-05-10 HISTORY — PX: LIPOMA EXCISION: SHX5283

## 2016-05-10 SURGERY — EXCISION LIPOMA
Anesthesia: General | Site: Head

## 2016-05-10 MED ORDER — LIDOCAINE HCL (CARDIAC) 20 MG/ML IV SOLN
INTRAVENOUS | Status: DC | PRN
  Administered 2016-05-10: 30 mg via INTRAVENOUS

## 2016-05-10 MED ORDER — CEFAZOLIN SODIUM-DEXTROSE 2-4 GM/100ML-% IV SOLN
2.0000 g | INTRAVENOUS | Status: AC
  Administered 2016-05-10: 2 g via INTRAVENOUS

## 2016-05-10 MED ORDER — SCOPOLAMINE 1 MG/3DAYS TD PT72: 1.0000 | MEDICATED_PATCH | Freq: Once | TRANSDERMAL | Status: DC | PRN

## 2016-05-10 MED ORDER — LACTATED RINGERS IV SOLN
INTRAVENOUS | Status: DC
  Administered 2016-05-10 (×2): via INTRAVENOUS

## 2016-05-10 MED ORDER — FENTANYL CITRATE (PF) 100 MCG/2ML IJ SOLN
INTRAMUSCULAR | Status: AC
  Filled 2016-05-10: qty 2

## 2016-05-10 MED ORDER — PHENYLEPHRINE HCL 10 MG/ML IJ SOLN
INTRAMUSCULAR | Status: DC | PRN
  Administered 2016-05-10: 40 ug via INTRAVENOUS

## 2016-05-10 MED ORDER — MIDAZOLAM HCL 2 MG/2ML IJ SOLN
INTRAMUSCULAR | Status: AC
  Filled 2016-05-10: qty 2

## 2016-05-10 MED ORDER — MIDAZOLAM HCL 2 MG/2ML IJ SOLN: 1.0000 mg | INTRAMUSCULAR | Status: DC | PRN

## 2016-05-10 MED ORDER — HEPARIN SODIUM (PORCINE) 5000 UNIT/ML IJ SOLN
5000.0000 [IU] | Freq: Once | INTRAMUSCULAR | Status: AC
  Administered 2016-05-10: 5000 [IU] via SUBCUTANEOUS

## 2016-05-10 MED ORDER — SODIUM BICARBONATE 4 % IV SOLN
INTRAVENOUS | Status: AC
  Filled 2016-05-10: qty 5

## 2016-05-10 MED ORDER — DEXAMETHASONE SODIUM PHOSPHATE 10 MG/ML IJ SOLN
INTRAMUSCULAR | Status: AC
  Filled 2016-05-10: qty 1

## 2016-05-10 MED ORDER — HEPARIN SODIUM (PORCINE) 5000 UNIT/ML IJ SOLN
INTRAMUSCULAR | Status: AC
  Filled 2016-05-10: qty 1

## 2016-05-10 MED ORDER — GLYCOPYRROLATE 0.2 MG/ML IV SOSY
PREFILLED_SYRINGE | INTRAVENOUS | Status: AC
  Filled 2016-05-10: qty 3

## 2016-05-10 MED ORDER — LIDOCAINE-EPINEPHRINE (PF) 1 %-1:200000 IJ SOLN
INTRAMUSCULAR | Status: AC
  Filled 2016-05-10: qty 30

## 2016-05-10 MED ORDER — EPHEDRINE SULFATE 50 MG/ML IJ SOLN
INTRAMUSCULAR | Status: DC | PRN
  Administered 2016-05-10 (×2): 10 mg via INTRAVENOUS

## 2016-05-10 MED ORDER — ONDANSETRON HCL 4 MG/2ML IJ SOLN
INTRAMUSCULAR | Status: AC
  Filled 2016-05-10: qty 2

## 2016-05-10 MED ORDER — PHENYLEPHRINE 40 MCG/ML (10ML) SYRINGE FOR IV PUSH (FOR BLOOD PRESSURE SUPPORT)
PREFILLED_SYRINGE | INTRAVENOUS | Status: AC
  Filled 2016-05-10: qty 10

## 2016-05-10 MED ORDER — EPHEDRINE 5 MG/ML INJ
INTRAVENOUS | Status: AC
  Filled 2016-05-10: qty 10

## 2016-05-10 MED ORDER — HYDROCODONE-ACETAMINOPHEN 5-325 MG PO TABS
ORAL_TABLET | ORAL | Status: AC
  Filled 2016-05-10: qty 1

## 2016-05-10 MED ORDER — BUPIVACAINE HCL (PF) 0.5 % IJ SOLN
INTRAMUSCULAR | Status: AC
  Filled 2016-05-10: qty 30

## 2016-05-10 MED ORDER — CEFAZOLIN SODIUM-DEXTROSE 2-4 GM/100ML-% IV SOLN
INTRAVENOUS | Status: AC
  Filled 2016-05-10: qty 100

## 2016-05-10 MED ORDER — FENTANYL CITRATE (PF) 100 MCG/2ML IJ SOLN
INTRAMUSCULAR | Status: DC | PRN
  Administered 2016-05-10 (×2): 50 ug via INTRAVENOUS

## 2016-05-10 MED ORDER — BACITRACIN-NEOMYCIN-POLYMYXIN 400-5-5000 EX OINT
TOPICAL_OINTMENT | CUTANEOUS | Status: AC
  Filled 2016-05-10: qty 1

## 2016-05-10 MED ORDER — HYDROCODONE-ACETAMINOPHEN 5-325 MG PO TABS: 1.0000 | ORAL_TABLET | ORAL | Status: DC | PRN

## 2016-05-10 MED ORDER — BACITRACIN-NEOMYCIN-POLYMYXIN OINTMENT TUBE
TOPICAL_OINTMENT | CUTANEOUS | Status: DC | PRN
  Administered 2016-05-10: 1 via TOPICAL

## 2016-05-10 MED ORDER — CHLORHEXIDINE GLUCONATE 4 % EX LIQD: 1.0000 "application " | Freq: Once | CUTANEOUS | Status: DC

## 2016-05-10 MED ORDER — MIDAZOLAM HCL 5 MG/5ML IJ SOLN
INTRAMUSCULAR | Status: DC | PRN
  Administered 2016-05-10: 2 mg via INTRAVENOUS

## 2016-05-10 MED ORDER — HYDROMORPHONE HCL 1 MG/ML IJ SOLN: 0.2500 mg | INTRAMUSCULAR | Status: DC | PRN

## 2016-05-10 MED ORDER — FENTANYL CITRATE (PF) 100 MCG/2ML IJ SOLN: 50.0000 ug | INTRAMUSCULAR | Status: DC | PRN

## 2016-05-10 MED ORDER — MEPERIDINE HCL 25 MG/ML IJ SOLN: 6.2500 mg | INTRAMUSCULAR | Status: DC | PRN

## 2016-05-10 MED ORDER — LIDOCAINE-EPINEPHRINE (PF) 1 %-1:200000 IJ SOLN
INTRAMUSCULAR | Status: DC | PRN
  Administered 2016-05-10: 4 mL

## 2016-05-10 MED ORDER — HYDROCODONE-ACETAMINOPHEN 5-325 MG PO TABS
1.0000 | ORAL_TABLET | Freq: Once | ORAL | Status: AC
  Administered 2016-05-10: 1 via ORAL

## 2016-05-10 MED ORDER — LIDOCAINE HCL (PF) 1 % IJ SOLN
INTRAMUSCULAR | Status: AC
  Filled 2016-05-10: qty 30

## 2016-05-10 MED ORDER — ONDANSETRON HCL 4 MG/2ML IJ SOLN
INTRAMUSCULAR | Status: DC | PRN
  Administered 2016-05-10: 4 mg via INTRAVENOUS

## 2016-05-10 MED ORDER — GLYCOPYRROLATE 0.2 MG/ML IJ SOLN
0.2000 mg | Freq: Once | INTRAMUSCULAR | Status: AC | PRN
  Administered 2016-05-10: 0.2 mg via INTRAVENOUS

## 2016-05-10 MED ORDER — DEXAMETHASONE SODIUM PHOSPHATE 4 MG/ML IJ SOLN
INTRAMUSCULAR | Status: DC | PRN
  Administered 2016-05-10: 10 mg via INTRAVENOUS

## 2016-05-10 MED ORDER — PROPOFOL 10 MG/ML IV BOLUS
INTRAVENOUS | Status: DC | PRN
  Administered 2016-05-10: 200 mg via INTRAVENOUS

## 2016-05-10 SURGICAL SUPPLY — 54 items
APL SKNCLS STERI-STRIP NONHPOA (GAUZE/BANDAGES/DRESSINGS)
BENZOIN TINCTURE PRP APPL 2/3 (GAUZE/BANDAGES/DRESSINGS) IMPLANT
BLADE CLIPPER SURG (BLADE) IMPLANT
BLADE SURG 15 STRL LF DISP TIS (BLADE) ×1 IMPLANT
BLADE SURG 15 STRL SS (BLADE) ×2
CANISTER SUCT 1200ML W/VALVE (MISCELLANEOUS) ×1 IMPLANT
CLEANER CAUTERY TIP 5X5 PAD (MISCELLANEOUS) ×1 IMPLANT
COVER BACK TABLE 60X90IN (DRAPES) ×2 IMPLANT
COVER MAYO STAND STRL (DRAPES) ×2 IMPLANT
DECANTER SPIKE VIAL GLASS SM (MISCELLANEOUS) ×2 IMPLANT
DRAPE LAPAROTOMY 100X72 PEDS (DRAPES) IMPLANT
DRAPE U-SHAPE 76X120 STRL (DRAPES) ×1 IMPLANT
ELECT REM PT RETURN 9FT ADLT (ELECTROSURGICAL) ×2
ELECTRODE REM PT RTRN 9FT ADLT (ELECTROSURGICAL) ×1 IMPLANT
GLOVE BIO SURGEON STRL SZ 6.5 (GLOVE) ×1 IMPLANT
GLOVE BIO SURGEON STRL SZ8 (GLOVE) ×2 IMPLANT
GLOVE BIOGEL M STRL SZ7.5 (GLOVE) ×1 IMPLANT
GLOVE BIOGEL PI IND STRL 7.0 (GLOVE) IMPLANT
GLOVE BIOGEL PI IND STRL 8 (GLOVE) IMPLANT
GLOVE BIOGEL PI INDICATOR 7.0 (GLOVE) ×1
GLOVE BIOGEL PI INDICATOR 8 (GLOVE) ×1
GLOVE ECLIPSE 6.5 STRL STRAW (GLOVE) ×1 IMPLANT
GOWN STRL REUS W/ TWL LRG LVL3 (GOWN DISPOSABLE) ×1 IMPLANT
GOWN STRL REUS W/ TWL XL LVL3 (GOWN DISPOSABLE) ×1 IMPLANT
GOWN STRL REUS W/TWL LRG LVL3 (GOWN DISPOSABLE) ×2
GOWN STRL REUS W/TWL XL LVL3 (GOWN DISPOSABLE) ×4
LIQUID BAND (GAUZE/BANDAGES/DRESSINGS) IMPLANT
NDL HYPO 25X1 1.5 SAFETY (NEEDLE) ×1 IMPLANT
NDL PRECISIONGLIDE 27X1.5 (NEEDLE) IMPLANT
NEEDLE HYPO 25X1 1.5 SAFETY (NEEDLE) ×2 IMPLANT
NEEDLE PRECISIONGLIDE 27X1.5 (NEEDLE) IMPLANT
NS IRRIG 1000ML POUR BTL (IV SOLUTION) IMPLANT
PACK BASIN DAY SURGERY FS (CUSTOM PROCEDURE TRAY) ×2 IMPLANT
PAD CLEANER CAUTERY TIP 5X5 (MISCELLANEOUS) ×1
PENCIL BUTTON HOLSTER BLD 10FT (ELECTRODE) ×2 IMPLANT
SLEEVE SCD COMPRESS KNEE MED (MISCELLANEOUS) ×2 IMPLANT
SPONGE GAUZE 4X4 12PLY STER LF (GAUZE/BANDAGES/DRESSINGS) ×2 IMPLANT
SPONGE LAP 4X18 X RAY DECT (DISPOSABLE) ×1 IMPLANT
STRIP CLOSURE SKIN 1/2X4 (GAUZE/BANDAGES/DRESSINGS) IMPLANT
SUT ETHILON 3 0 FSL (SUTURE) IMPLANT
SUT ETHILON 5 0 PS 2 18 (SUTURE) IMPLANT
SUT PROLENE 3 0 PS 2 (SUTURE) ×2 IMPLANT
SUT VIC AB 3-0 SH 27 (SUTURE)
SUT VIC AB 3-0 SH 27X BRD (SUTURE) IMPLANT
SUT VIC AB 4-0 SH 18 (SUTURE) ×2 IMPLANT
SUT VIC AB 5-0 PS2 18 (SUTURE) IMPLANT
SUT VICRYL 3-0 CR8 SH (SUTURE) IMPLANT
SYR BULB 3OZ (MISCELLANEOUS) ×2 IMPLANT
SYR CONTROL 10ML LL (SYRINGE) ×2 IMPLANT
TOWEL OR 17X24 6PK STRL BLUE (TOWEL DISPOSABLE) ×2 IMPLANT
TRAY DSU PREP LF (CUSTOM PROCEDURE TRAY) ×2 IMPLANT
TUBE CONNECTING 20X1/4 (TUBING) ×1 IMPLANT
UNDERPAD 30X30 (UNDERPADS AND DIAPERS) IMPLANT
YANKAUER SUCT BULB TIP NO VENT (SUCTIONS) ×1 IMPLANT

## 2016-05-10 NOTE — Transfer of Care (Signed)
Immediate Anesthesia Transfer of Care Note  Patient: [name] [name]  Procedure(s) Performed: Procedure(s): EXCISION OF SCALP LIPOMA (N/A)  Patient Location: PACU  Anesthesia Type:General  Level of Consciousness: awake and patient cooperative  Airway & Oxygen Therapy: Patient Spontanous Breathing and Patient connected to face mask oxygen  Post-op Assessment: Report given to RN and Post -op Vital signs reviewed and stable  Post vital signs: Reviewed and stable  Last Vitals:  Filed Vitals:   05/10/16 1325  BP: 116/64  Pulse: 54  Temp: 36.8 C  Resp: 16    Last Pain: There were no vitals filed for this visit.       Complications: No apparent anesthesia complications

## 2016-05-10 NOTE — Anesthesia Postprocedure Evaluation (Signed)
Anesthesia Post Note  Patient: [name] [name]  Procedure(s) Performed: Procedure(s) (LRB): EXCISION OF SCALP LIPOMA (N/A)  Patient location during evaluation: PACU Anesthesia Type: General Level of consciousness: awake and alert Pain management: pain level controlled Vital Signs Assessment: post-procedure vital signs reviewed and stable Respiratory status: spontaneous breathing, nonlabored ventilation and respiratory function stable Cardiovascular status: blood pressure returned to baseline and stable Postop Assessment: no signs of nausea or vomiting Anesthetic complications: no    Last Vitals:  Filed Vitals:   05/10/16 1545 05/10/16 1600  BP: 115/82 135/94  Pulse: 95 78  Temp:    Resp: 17 15    Last Pain:  Filed Vitals:   05/10/16 1617  PainSc: 3                  CREWS,DAVID A

## 2016-05-10 NOTE — H&P (Signed)
Chief Complaint:  Mass on left side of head.  Incompletely excised   History of Present Illness:  [name] [name] is an 55 y.o. male who presented to [facility] office with an incompletely excised left head mass.  Bleeding precluded complete excsiion at dermatogy office  Past Medical History  Diagnosis Date  . 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 Reflux Dx 2007  . Pancreatitis Dx 2014  . Anxiety     About 55 years of age  . Depression     At 55 years of age  . GERD (gastroesophageal reflux disease) Dx 2007  . Carpal tunnel syndrome of right wrist Dx 2015  . Alcohol abuse     Last Drink on Sep 2015  . Pneumonia 11/2014  . Hepatitis, alcoholic, acute 123456  . Hyperlipidemia     borderline, diet controlled  . 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 Knee arthroscopy    . Carpel tunnel release Right 07/28/2014     done in [place]   . Elbow surgery    . Knee surgery Left     15 years ago    Current Facility-Administered Medications  Medication Dose Route Frequency Provider Last Rate Last Dose  . [START ON 05/11/2016] ceFAZolin (ANCEF) IVPB 2g/100 mL premix  2 g Intravenous On Call to [place], MD      . chlorhexidine (HIBICLENS) 4 % liquid 1 application  1 application Topical Once Johnathan Hausen, MD      . Derrill Memo ON 05/11/2016] chlorhexidine (HIBICLENS) 4 % liquid 1 application  1 application Topical Once Johnathan Hausen, MD      . fentaNYL (SUBLIMAZE) injection 50-100 mcg  50-100 mcg Intravenous PRN Lorrene Reid, MD      . glycopyrrolate (ROBINUL) injection 0.2 mg  0.2 mg Intravenous Once PRN Lorrene Reid, MD      . lactated ringers infusion   Intravenous Continuous Lorrene Reid, MD 10 mL/hr at 05/10/16 1343    . midazolam (VERSED) injection 1-2 mg  1-2 mg Intravenous PRN Lorrene Reid, MD      . scopolamine (TRANSDERM-SCOP) 1 MG/3DAYS 1.5 mg  1 patch Transdermal Once PRN Lorrene Reid, MD       Oxycodone Family History   Problem Relation Age of Onset  . Diabetes Father   . Hypertension Father   . Hypertension Paternal Grandfather   . Alcohol abuse Paternal Grandfather   . Alcohol abuse Maternal Grandfather   . Alcohol abuse Maternal Grandmother   . Alcohol abuse Paternal Grandmother   . Colon cancer Neg Hx   . Rectal cancer Neg Hx   . Stomach cancer Neg Hx    Social History:   reports that he has quit smoking. His smoking use included Cigars. He has never used smokeless tobacco. He reports that he drinks alcohol. He reports that he does not use illicit drugs.   REVIEW OF SYSTEMS : Negative except for see problem list  Physical Exam:   Blood pressure 116/64, pulse 54, temperature 98.3 F (36.8 C), temperature source Oral, resp. rate 16, height 5\' 9"  (1.753 m), weight 59.421 kg (131 lb), SpO2 100 %. Body mass index is 19.34 kg/(m^2).  Gen:  WDWN AAM NAD  Neurological: Alert and oriented to person, place, and time. Motor and sensory function is grossly intact  Head: Normocephalic and atraumatic.  Eyes: Conjunctivae are normal. Pupils are equal, round, and reactive to light. No scleral icterus.  Neck: Normal range of motion. Neck supple. No tracheal deviation or thyromegaly present.  Cardiovascular:  SR without murmurs or gallops.  No carotid bruits Breast:  Not examined Respiratory: Effort normal.  No respiratory distress. No chest wall tenderness. Breath sounds normal.  No wheezes, rales or rhonchi.  Abdomen:  nontender  GU:  Not examined Musculoskeletal: Normal range of motion. Extremities are nontender. No cyanosis, edema or clubbing noted Lymphadenopathy: No cervical, preauricular, postauricular or axillary adenopathy is present Skin: Skin is warm and dry. No rash noted. No diaphoresis. No erythema. No pallor. Pscyh: Normal mood and affect. Behavior is normal. Judgment and thought content normal.   LABORATORY RESULTS: No results found for this or any previous visit (from the past 48  hour(s)).   RADIOLOGY RESULTS: No results found.  Problem List: Patient Active Problem List   Diagnosis Date Noted  . 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 CAP (community acquired pneumonia) 01/26/2016  . Bilateral knee pain 01/11/2016  . Loss of weight 09/27/2015  . Fatigue 09/27/2015  . Chronic pain of right wrist 07/07/2015  . Carpal tunnel syndrome of right wrist 05/20/2015  . Dislocation of right shoulder joint 05/20/2015  . Tear of medial meniscus of right knee 03/24/2015  . Epidermal cyst 02/22/2015  . 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 Bronchiectasis without acute exacerbation ([place]) 01/27/2015  . Chronic cough 01/15/2015  . Onychomycosis of toenail 12/15/2014  . Mild alcohol abuse in early remission 11/11/2014  . Seborrhea 11/11/2014  . GERD (gastroesophageal reflux disease)   . Generalized anxiety disorder 09/02/2013    Assessment & Plan: Mass on left side of head is marked and ready for reexcision.      Matt B. Hassell Done, MD, [facility] [phone] beeper [phone]  05/10/2016 2:21 PM

## 2016-05-10 NOTE — Anesthesia Preprocedure Evaluation (Addendum)
Anesthesia Evaluation  Patient identified by MRN, date of birth, ID band Patient awake    Reviewed: Allergy & Precautions, NPO status , Patient's Chart, lab work & pertinent test results  Airway Mallampati: I  TM Distance: >3 FB Neck ROM: Full    Dental  (+) Teeth Intact, Dental Advisory Given   Pulmonary former smoker,  Sarcoidosis   breath sounds clear to auscultation       Cardiovascular  Rhythm:Regular Rate:Normal     Neuro/Psych    GI/Hepatic GERD  Medicated and Controlled,  Endo/Other    Renal/GU      Musculoskeletal   Abdominal   Peds  Hematology   Anesthesia Other Findings   Reproductive/Obstetrics                            Anesthesia Physical Anesthesia Plan  ASA: II  Anesthesia Plan: General   Post-op Pain Management:    Induction: Intravenous  Airway Management Planned: LMA  Additional Equipment:   Intra-op Plan:   Post-operative Plan: Extubation in OR  Informed Consent: I have reviewed the patients History and Physical, chart, labs and discussed the procedure including the risks, benefits and alternatives for the proposed anesthesia with the patient or authorized representative who has indicated his/her understanding and acceptance.   Dental advisory given  Plan Discussed with: CRNA, Anesthesiologist and Surgeon  Anesthesia Plan Comments:         Anesthesia Quick Evaluation

## 2016-05-10 NOTE — Op Note (Signed)
Surgeon: Kaylyn Lim, MD, FACS  Asst:  none  Anes:  General by LMA  Procedure: Excision of scalp vascular lipoma  Diagnosis: Mass of left scalp incised by not resected at dermatologist  Complications: none  EBL:   20 cc  Drains: none  Description of Procedure:  The patient was taken to OR 1  at [facility].  After anesthesia was administered and the patient was prepped a timeout was performed.  The mass was palpated and an incision drawn across it.  This was carried down to the subcutaneous level where a plethora of large arteries were encountered and were ligated and cauterized.  This vasular mass was excised exposing an underlying lipoma.  This was excised.  The cautery and 4-0 vicryl were used to apporximate the subcutaneous layer and the skin was approximated with 3-0 prolene.  This case was more vascular and bloody than a usual lipoma of the scalp.  Lidocaine with epi was injected at the end of the case.    The patient tolerated the procedure well and was taken to the PACU in stable condition.     Matt B. Hassell Done, [place], [facility], [place]

## 2016-05-10 NOTE — Anesthesia Procedure Notes (Signed)
Procedure Name: LMA Insertion Date/Time: 05/10/2016 2:34 PM Performed by: Toula Moos L Pre-anesthesia Checklist: Patient identified, Emergency Drugs available, Suction available, Patient being monitored and Timeout performed Patient Re-evaluated:Patient Re-evaluated prior to inductionOxygen Delivery Method: Circle system utilized Preoxygenation: Pre-oxygenation with 100% oxygen Intubation Type: IV induction Ventilation: Mask ventilation without difficulty LMA: LMA inserted LMA Size: 4.0 Number of attempts: 1 Airway Equipment and Method: Bite block Placement Confirmation: positive ETCO2 Tube secured with: Tape Dental Injury: Teeth and Oropharynx as per pre-operative assessment

## 2016-05-10 NOTE — Discharge Instructions (Signed)
Stay out of work tomorrow Remove dressing tomorrow and shampoo Apply Neopsporin ointment to incision daily   Post Anesthesia Home Care Instructions  Activity: Get plenty of rest for the remainder of the day. A responsible adult should stay with you for 24 hours following the procedure.  For the next 24 hours, DO NOT: -Drive a car -Paediatric nurse -Drink alcoholic beverages -Take any medication unless instructed by your physician -Make any legal decisions or sign important papers.  Meals: Start with liquid foods such as gelatin or soup. Progress to regular foods as tolerated. Avoid greasy, spicy, heavy foods. If nausea and/or vomiting occur, drink only clear liquids until the nausea and/or vomiting subsides. Call your physician if vomiting continues.  Special Instructions/Symptoms: Your throat may feel dry or sore from the anesthesia or the breathing tube placed in your throat during surgery. If this causes discomfort, gargle with warm salt water. The discomfort should disappear within 24 hours.  If you had a scopolamine patch placed behind your ear for the management of post- operative nausea and/or vomiting:  1. The medication in the patch is effective for 72 hours, after which it should be removed.  Wrap patch in a tissue and discard in the trash. Wash hands thoroughly with soap and water. 2. You may remove the patch earlier than 72 hours if you experience unpleasant side effects which may include dry mouth, dizziness or visual disturbances. 3. Avoid touching the patch. Wash your hands with soap and water after contact with the patch.

## 2016-05-11 ENCOUNTER — Encounter (HOSPITAL_BASED_OUTPATIENT_CLINIC_OR_DEPARTMENT_OTHER): Payer: Self-pay | Admitting: Surgery

## 2016-05-12 ENCOUNTER — Ambulatory Visit: Payer: Self-pay | Admitting: Adult Health

## 2016-05-17 ENCOUNTER — Ambulatory Visit: Payer: BLUE CROSS/BLUE SHIELD | Admitting: Family Medicine

## 2016-05-19 ENCOUNTER — Telehealth: Payer: Self-pay | Admitting: Family Medicine

## 2016-05-19 DIAGNOSIS — F1011 Alcohol abuse, in remission: Secondary | ICD-10-CM

## 2016-05-19 NOTE — Telephone Encounter (Signed)
This is not a long term medication - I will forward to Dr. Adrian Blackwater but patient is likely not supposed to have any refills.

## 2016-05-19 NOTE — Telephone Encounter (Signed)
Medication Refill: chlordiazePOXIDE (LIBRIUM) 25 MG capsule

## 2016-05-22 MED ORDER — CHLORDIAZEPOXIDE HCL 25 MG PO CAPS: ORAL_CAPSULE | ORAL | Status: DC

## 2016-05-22 NOTE — Telephone Encounter (Signed)
Called patient left HIPPA compliant VM requesting a call back at his earliest convenience.  When he calls clinical to inform him that  Librium was a taper for ETOH abuse and not a long term medication.   No refills recommended. If he is still drinking he is advised to seek AA or other ETOH abuse support if needed

## 2016-05-22 NOTE — Telephone Encounter (Signed)
Pt called. Verified pt name and date of birth. Pt notified that librium was a taper for ETOH abuse, not a long term medication. Pt advised if he is still drinking he is advised to seek AA or other ETOH abuse support. Pt reports he was only given enough for 3 days and request if it could be extended. Pt spoke with Dr.Funches. Rx refill for librium placed. Pt notified the rx will be placed at the front for him to pick up. Pt voiced understanding and asked to make an appt to be seen. Pt transferred to front office to make appt.

## 2016-05-22 NOTE — Telephone Encounter (Signed)
I spoke to patient he is still drinking.  He request 6 more days of librium Refilled librium x 6 days , advised AA  Patient agrees with plan and voices understanding

## 2016-05-22 NOTE — Addendum Note (Signed)
Addended by: Boykin Nearing on: 05/22/2016 12:46 PM   Modules accepted: Orders

## 2016-05-26 ENCOUNTER — Emergency Department (HOSPITAL_COMMUNITY)
Admission: EM | Admit: 2016-05-26 | Discharge: 2016-05-26 | Disposition: A | Discharge status: Home / Self Care | Payer: BLUE CROSS/BLUE SHIELD | Attending: Emergency Medicine | Admitting: Emergency Medicine

## 2016-05-26 ENCOUNTER — Encounter (HOSPITAL_COMMUNITY): Payer: Self-pay | Admitting: Emergency Medicine

## 2016-05-26 DIAGNOSIS — Z79899 Other long term (current) drug therapy: Secondary | ICD-10-CM | POA: Insufficient documentation

## 2016-05-26 DIAGNOSIS — Z87891 Personal history of nicotine dependence: Secondary | ICD-10-CM | POA: Diagnosis not present

## 2016-05-26 DIAGNOSIS — F1020 Alcohol dependence, uncomplicated: Secondary | ICD-10-CM

## 2016-05-26 DIAGNOSIS — F329 Major depressive disorder, single episode, unspecified: Secondary | ICD-10-CM | POA: Insufficient documentation

## 2016-05-26 DIAGNOSIS — Z4802 Encounter for removal of sutures: Secondary | ICD-10-CM | POA: Diagnosis not present

## 2016-05-26 DIAGNOSIS — E785 Hyperlipidemia, unspecified: Secondary | ICD-10-CM | POA: Diagnosis not present

## 2016-05-26 MED ORDER — CHLORDIAZEPOXIDE HCL 25 MG PO CAPS: ORAL_CAPSULE | ORAL | Status: DC

## 2016-05-26 NOTE — Discharge Instructions (Signed)
Alcohol Use Disorder °Alcohol use disorder is a mental disorder. It is not a one-time incident of heavy drinking. Alcohol use disorder is the excessive and uncontrollable use of alcohol over time that leads to problems with functioning in one or more areas of daily living. People with this disorder risk harming themselves and others when they drink to excess. Alcohol use disorder also can cause other mental disorders, such as mood and anxiety disorders, and serious physical problems. People with alcohol use disorder often misuse other drugs.  °Alcohol use disorder is common and widespread. Some people with this disorder drink alcohol to cope with or escape from negative life events. Others drink to relieve chronic pain or symptoms of mental illness. People with a family history of alcohol use disorder are at higher risk of losing control and using alcohol to excess.  °Drinking too much alcohol can cause injury, accidents, and health problems. One drink can be too much when you are: °· Working. °· Pregnant or breastfeeding. °· Taking medicines. Ask your doctor. °· Driving or planning to drive. °SYMPTOMS  °Signs and symptoms of alcohol use disorder may include the following:  °· Consumption of alcohol in larger amounts or over a longer period of time than intended. °· Multiple unsuccessful attempts to cut down or control alcohol use.   °· A great deal of time spent obtaining alcohol, using alcohol, or recovering from the effects of alcohol (hangover). °· A strong desire or urge to use alcohol (cravings).   °· Continued use of alcohol despite problems at work, school, or home because of alcohol use.   °· Continued use of alcohol despite problems in relationships because of alcohol use. °· Continued use of alcohol in situations when it is physically hazardous, such as driving a car. °· Continued use of alcohol despite awareness of a physical or psychological problem that is likely related to alcohol use. Physical  problems related to alcohol use can involve the brain, heart, liver, stomach, and intestines. Psychological problems related to alcohol use include intoxication, depression, anxiety, psychosis, delirium, and dementia.   °· The need for increased amounts of alcohol to achieve the same desired effect, or a decreased effect from the consumption of the same amount of alcohol (tolerance). °· Withdrawal symptoms upon reducing or stopping alcohol use, or alcohol use to reduce or avoid withdrawal symptoms. Withdrawal symptoms include: °¨ Racing heart. °¨ Hand tremor. °¨ Difficulty sleeping. °¨ Nausea. °¨ Vomiting. °¨ Hallucinations. °¨ Restlessness. °¨ Seizures. °DIAGNOSIS °Alcohol use disorder is diagnosed through an assessment by your health care provider. Your health care provider may start by asking three or four questions to screen for excessive or problematic alcohol use. To confirm a diagnosis of alcohol use disorder, at least two symptoms must be present within a 12-month period. The severity of alcohol use disorder depends on the number of symptoms: °· Mild--two or three. °· Moderate--four or five. °· Severe--six or more. °Your health care provider may perform a physical exam or use results from lab tests to see if you have physical problems resulting from alcohol use. Your health care provider may refer you to a mental health professional for evaluation. °TREATMENT  °Some people with alcohol use disorder are able to reduce their alcohol use to low-risk levels. Some people with alcohol use disorder need to quit drinking alcohol. When necessary, mental health professionals with specialized training in substance use treatment can help. Your health care provider can help you decide how severe your alcohol use disorder is and what type of treatment you need.   The following forms of treatment are available:   Detoxification. Detoxification involves the use of prescription medicines to prevent alcohol withdrawal  symptoms in the first week after quitting. This is important for people with a history of symptoms of withdrawal and for heavy drinkers who are likely to have withdrawal symptoms. Alcohol withdrawal can be dangerous and, in severe cases, cause death. Detoxification is usually provided in a hospital or in-patient substance use treatment facility.  Counseling or talk therapy. Talk therapy is provided by substance use treatment counselors. It addresses the reasons people use alcohol and ways to keep them from drinking again. The goals of talk therapy are to help people with alcohol use disorder find healthy activities and ways to cope with life stress, to identify and avoid triggers for alcohol use, and to handle cravings, which can cause relapse.  Medicines.Different medicines can help treat alcohol use disorder through the following actions:  Decrease alcohol cravings.  Decrease the positive reward response felt from alcohol use.  Produce an uncomfortable physical reaction when alcohol is used (aversion therapy).  Support groups. Support groups are run by people who have quit drinking. They provide emotional support, advice, and guidance. These forms of treatment are often combined. Some people with alcohol use disorder benefit from intensive combination treatment provided by specialized substance use treatment centers. Both inpatient and outpatient treatment programs are available.   This information is not intended to replace advice given to you by your health care provider. Make sure you discuss any questions you have with your health care provider.   Document Released: 12/21/2004 Document Revised: 12/04/2014 Document Reviewed: 02/20/2013 Elsevier Interactive Patient Education 2016 [place] [facility]stance [facility] The United Ways 211 is a great source of information about community services available.  Access by dialing 2-1-1 from anywhere  in [place], or by website -  CustodianSupply.fi.   Other Local Resources (Updated 11/2015)  [facility]  Crisis Hotline, available 24 hours a day, 7 days a week: [place], [place]   [facility]  Crisis Hotline, available 24 hours a day, 7 days a week: [place], [place]  [facility]  Suicide Prevention Hotline, available 24 hours a day, 7 days a week: [place], [place], available 24 hours a day, 7 days a week: [place], [place] Access to [employer], available 24 hours a day, 7 days a week: 9725056323 All   [facility]  Crisis Hotline, available 24 hours a day, 7 days a week: 941-143-0451 All   Other Local Resources (Updated 11/2015)  Outpatient Counseling/ Substance Abuse Programs  Services     Address and Phone Number  ADS (Alcohol and Drug Services)   Options include Individual counseling, group counseling, intensive outpatient program (several hours a day, several days a week)  Offers depression assessments  Provides methadone maintenance program [phone] [address], [place], [place] partial hospitalization/day treatment and DUI/DWI programs  [employer], private insurance [phone] [phone] [place], [place] [address]  [place] include intensive outpatient program (several hours a day, several days a week), outpatient treatment, DUI/DWI services, family education  Also has some services specifically for [employer] transitional housing  [phone] [address] [place],  [place] [address]     [facility]  Accepts Medicare, private pay, and private insurance [phone] [address] [place], [place], [place] [address]  [facility]  Services include individual counseling, substance abuse intensive outpatient program (several hours a day, several days a week), day treatment  Blinda [place], Medicaid, private insurance [phone], [place], [place] [address]  [facility]   Offers substance abuse intensive outpatient program (several hours a day, several days a week), partial hospitalization program [phone] [address] [place], [place] [address]  [phone] [address], [place] [address]  [phone] [place], [place] [address]  [phone], [place], [place] [address]  [facility]  Individual counseling only  Accepts private insurance only [phone] [address] [place], [place] [address]  [facility]adone Clinic  Methadone maintenance program [phone] [address] [place], [place] [address]  [facility] providing substance abuse and mental health counseling  Accepts Medicaid, Medicare, private insurance  Offers sliding scale for uninsured [phone] [place], [place] in [place] individual counseling, and intensive in-home services [phone] [address], [place], [place] [address]  [facility] individual counseling, family counseling, group therapy, domestic violence counseling, consumer credit counseling  Accepts Medicare, Medicaid, private insurance  Offers sliding scale for uninsured [phone] [address] [place], [place] [address]  [phone] [facility], [address] [place], [place]  [facility]  Offers individual, family and group counseling  3 locations - [place], [place], and [place]  [address], [place] [address]  [address] [place], [place] [address]  [place], [place] [address]  [facility]    Offers psychiatric assessment, 8-week Intensive Outpatient Program (several hours a day, several times a week, daytime or evenings), early recovery group, family Program, medication management  Private pay or private insurance only [phone], or  [phone] [address] [place], [place] [address]  [facility]  Offers individual, couples and family counseling  Accepts Medicaid, private insurance, and sliding scale for uninsured [phone] [address] [place], [place] [address]  Launa Flight, MD  Individual counseling  Private insurance [phone] [place], [place] [address]  [facility]   Offers assessment, substance abuse treatment, and behavioral health treatment [phone] [address] [place], [place] [address]  [place]  Individual counseling  Accepts private insurance [phone] [place], [place] [address]  [facility]  Individual counseling  Accepts Medicare, private insurance [phone] [place], [place] [address]  [place]    Offers intensive outpatient program (several hours a day, several times a week)  Private pay, private insurance [place], [place]  Individual counseling  Medicare, private insurance [phone] [address] [place], [place] [address]  [facility]    Offers intensive outpatient program (several hours a day, several times a week) and partial hospitalization program [phone] [place], [place] [address]  Letta Moynahan, MD  Individual counseling [phone] [address] [place],  [address]   [facility]  Offers [name] counseling to individuals, couples, and families  Accepts  Medicare and private insurance; offers sliding scale for uninsured [phone] [place], [place] [address]  [facility]  [name] counseling [phone] [address] [place], [place] [address]  [facility] [employer] crisis counseling, individual counseling, group therapy, in-home therapy, domestic violence services, day treatment, DWI services, Conservation officer, nature (CST), Actuary (ACTT), substance abuse Intensive Outpatient Program (several hours a day, several times a week)  2 locations - [place] and [place], [place] [address]  [phone] [address] [place], [place] [address]  [place] counseling and group therapy  [employer], [place], [place] [phone] [address] [place], [place]  [facility]  Offers individual and family counseling  Offers LGBTQ services  Accepts private insurance and private pay [phone] [place], [place] [address]  [facility]    Offers individual counseling, group therapy, and outpatient detox  Accepts private insurance [phone] [place], [place] Medicare, private insurance [phone] [address], [address] [place], [place] [address]  [employer]  Individual counseling  Accepts Medicare, private insurance [phone] [address] [place], [place] [address]  [facility] [place] substance abuse Intensive Outpatient Program (several hours a day, several times a week) [phone], or [phone] [place], [place]

## 2016-05-26 NOTE — ED Provider Notes (Signed)
CSN: [id number]     Arrival date & time 05/26/16  2230 History   First MD Initiated Contact with Patient 05/26/16 2251     Chief Complaint  Patient presents with  . detox       HPI  She presents for evaluation of "help with alcohol" and suture removal. Had a cyst removed from his posterior scalp approximately 3 weeks ago. Sutures out. States she's been "relapsing". States drinking 1224 beers per day for the last month and did so today. He was able to get himself to the bus stop from his home and rest of the bus here was able to walk to triage rather evaluated him. He denies any withdrawal symptoms currently. He states he which is on days when he stopped striking. His never had a seizure but he gets "shaky". Denies hallucinations.  Past Medical History  Diagnosis Date  . 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 Reflux Dx 2007  . Pancreatitis Dx 2014  . Anxiety     About 55 years of age  . Depression     At 55 years of age  . GERD (gastroesophageal reflux disease) Dx 2007  . Carpal tunnel syndrome of right wrist Dx 2015  . Alcohol abuse     Last Drink on Sep 2015  . Pneumonia 11/2014  . Hepatitis, alcoholic, acute 123456  . Hyperlipidemia     borderline, diet controlled  . 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 Knee arthroscopy    . Carpel tunnel release Right 07/28/2014     done in [place]   . Elbow surgery    . Knee surgery Left     15 years ago  . Lipoma excision N/A 05/10/2016    Procedure: EXCISION OF SCALP LIPOMA;  Surgeon: Johnathan Hausen, MD;  Location: [facility];  Service: General;  Laterality: N/A;   Family History  Problem Relation Age of Onset  . Diabetes Father   . Hypertension Father   . Hypertension Paternal Grandfather   . Alcohol abuse Paternal Grandfather   . Alcohol abuse Maternal Grandfather   . Alcohol abuse Maternal Grandmother   . Alcohol abuse Paternal Grandmother   .  Colon cancer Neg Hx   . Rectal cancer Neg Hx   . Stomach cancer Neg Hx    Social History  Substance Use Topics  . Smoking status: Former Smoker    Types: Cigars  . Smokeless tobacco: Never Used     Comment: smokes occasional black &mild  . Alcohol Use: 0.0 oz/week    0 Standard drinks or equivalent per week     Comment: last ETOH 05-01-16 had beer    Review of Systems  Constitutional: Negative for fever, chills, diaphoresis, appetite change and fatigue.  HENT: Negative for mouth sores, sore throat and trouble swallowing.   Eyes: Negative for visual disturbance.  Respiratory: Negative for cough, chest tightness, shortness of breath and wheezing.   Cardiovascular: Negative for chest pain.  Gastrointestinal: Negative for nausea, vomiting, abdominal pain, diarrhea and abdominal distention.  Endocrine: Negative for polydipsia, polyphagia and polyuria.  Genitourinary: Negative for dysuria, frequency and hematuria.  Musculoskeletal: Negative for gait problem.  Skin: Negative for color change, pallor and rash.  Neurological: Negative for dizziness, syncope, light-headedness and headaches.  Hematological: Does not bruise/bleed easily.  Psychiatric/Behavioral: Negative for behavioral problems and confusion.      Allergies  Oxycodone  Home Medications  Prior to Admission medications   Medication Sig Start Date End Date Taking? Authorizing Provider  NAFTIN 2 % CREA Take 1 application by mouth 2 (two) times daily as needed. For eczema. 02/22/16  Yes Historical Provider, MD  triamcinolone (KENALOG) 0.025 % cream Apply 1 application topically daily. 02/22/16  Yes Historical Provider, MD  albuterol (PROVENTIL HFA;VENTOLIN HFA) 108 (90 Base) MCG/ACT inhaler Inhale 2 puffs into the lungs every 6 (six) hours as needed for wheezing or shortness of breath. Patient not taking: Reported on 05/26/2016 02/11/16   Boykin Nearing, MD  chlordiazePOXIDE (LIBRIUM) 25 MG capsule 50mg  PO TID x 1D, then  25-50mg  PO BID X 1D, then 25-50mg  PO QD X 1D 05/26/16   Tanna Furry, MD  escitalopram (LEXAPRO) 10 MG tablet Take 1 tablet (10 mg total) by mouth daily. Patient not taking: Reported on 05/26/2016 02/11/16   Boykin Nearing, MD  HYDROcodone-acetaminophen (NORCO) 5-325 MG tablet Take 1 tablet by mouth every 4 (four) hours as needed for moderate pain. Patient not taking: Reported on 05/26/2016 05/10/16   Johnathan Hausen, MD  oxymetazoline [facility] NASAL SPRAY) 0.05 % nasal spray Place 1 spray into both nostrils 2 (two) times daily. 02/13/16   Josalyn Funches, MD   BP 143/95 mmHg  Pulse 89  Temp(Src) 98.1 F (36.7 C) (Oral)  Resp 16  Ht 5\' 9"  (1.753 m)  Wt 131 lb (59.421 kg)  BMI 19.34 kg/m2  SpO2 95% Physical Exam  Constitutional: He is oriented to person, place, and time. He appears well-developed and well-nourished. No distress.  HENT:  Head: Normocephalic.    Eyes: Conjunctivae are normal. Pupils are equal, round, and reactive to light. No scleral icterus.  Neck: Normal range of motion. Neck supple. No thyromegaly present.  Cardiovascular: Normal rate and regular rhythm.  Exam reveals no gallop and no friction rub.   No murmur heard. Pulmonary/Chest: Effort normal and breath sounds normal. No respiratory distress. He has no wheezes. He has no rales.  Abdominal: Soft. Bowel sounds are normal. He exhibits no distension. There is no tenderness. There is no rebound.  Musculoskeletal: Normal range of motion.  Neurological: He is alert and oriented to person, place, and time.  Skin: Skin is warm and dry. No rash noted.  Psychiatric: He has a normal mood and affect. His behavior is normal.    ED Course  Procedures (including critical care time) Labs Review Labs Reviewed - No data to display  Imaging Review No results found. I have personally reviewed and evaluated these images and lab results as part of my medical decision-making.   EKG Interpretation None      MDM   Final diagnoses:   Alcoholism ([place])  Visit for suture removal   SUTURE REMOVAL Performed by: Lolita Patella  Consent: Verbal consent obtained. Consent given by: patient Required items: required blood products, implants, devices, and special equipment available Time out: Immediately prior to procedure a "time out" was called to verify the correct patient, procedure, equipment, support staff and site/side marked as required.  Location: lt occipital scalp  Wound Appearance: clean  Sutures/Staples Removed: 3  Patient tolerance: Patient tolerated the procedure well with no immediate complications.     She got himself on the bus. He is ambulatory. He is stable on his feet. He is not tachycardic or tremulous and shows no signs early with jaw. Given a prescription for Librium. Stated as of tomorrow or state he can fill tomorrow with his intention is to do rehabilitation and  not drink and avoid withdrawal. I told him in no uncertain terms not to use Librium if his intention is to continue drinking. Sutures were removed from a incision on the posterior left scalp that is quite healed.    Tanna Furry, MD 05/26/16 [phone]

## 2016-05-26 NOTE — ED Notes (Signed)
Pt states he is here because he would like to detox from etoh. Pt denies other drug use. Pt states he normally drinks about 12 cans of beer a day and has had about that much today. Pt denies SI/HI, A/VH. Pt denies tactile hallucinations. Pt states he has been drinking 12 beers a day for about a month. Pt is not diaphoretic nor is he tremulous at time of assessment.  Pt states he would also like some stiches removed from the top left side of his head from a cyst removal. Pt states he was supposed to see his doctor today for suture removal, but did not.

## 2016-05-29 ENCOUNTER — Emergency Department (HOSPITAL_COMMUNITY): Payer: BLUE CROSS/BLUE SHIELD

## 2016-05-29 ENCOUNTER — Emergency Department (HOSPITAL_COMMUNITY)
Admission: EM | Admit: 2016-05-29 | Discharge: 2016-05-30 | Disposition: A | Discharge status: Home / Self Care | Payer: BLUE CROSS/BLUE SHIELD | Attending: Emergency Medicine | Admitting: Emergency Medicine

## 2016-05-29 DIAGNOSIS — F329 Major depressive disorder, single episode, unspecified: Secondary | ICD-10-CM | POA: Insufficient documentation

## 2016-05-29 DIAGNOSIS — E860 Dehydration: Secondary | ICD-10-CM | POA: Diagnosis not present

## 2016-05-29 DIAGNOSIS — F1010 Alcohol abuse, uncomplicated: Secondary | ICD-10-CM | POA: Insufficient documentation

## 2016-05-29 DIAGNOSIS — Z87891 Personal history of nicotine dependence: Secondary | ICD-10-CM | POA: Diagnosis not present

## 2016-05-29 DIAGNOSIS — M79661 Pain in right lower leg: Secondary | ICD-10-CM | POA: Diagnosis not present

## 2016-05-29 DIAGNOSIS — M79662 Pain in left lower leg: Secondary | ICD-10-CM | POA: Diagnosis not present

## 2016-05-29 DIAGNOSIS — M791 Myalgia, unspecified site: Secondary | ICD-10-CM

## 2016-05-29 DIAGNOSIS — F10239 Alcohol dependence with withdrawal, unspecified: Secondary | ICD-10-CM | POA: Diagnosis present

## 2016-05-29 LAB — URINALYSIS, ROUTINE W REFLEX MICROSCOPIC
Bilirubin Urine: NEGATIVE
Glucose, UA: NEGATIVE mg/dL
Hgb urine dipstick: NEGATIVE
Ketones, ur: NEGATIVE mg/dL
Leukocytes, UA: NEGATIVE
Nitrite: NEGATIVE
Protein, ur: NEGATIVE mg/dL
Specific Gravity, Urine: 1.009 (ref 1.005–1.030)
pH: 5.5 (ref 5.0–8.0)

## 2016-05-29 LAB — RAPID URINE DRUG SCREEN, HOSP PERFORMED
Amphetamines: NOT DETECTED
Barbiturates: NOT DETECTED
Benzodiazepines: NOT DETECTED
Cocaine: NOT DETECTED
Opiates: NOT DETECTED
Tetrahydrocannabinol: NOT DETECTED

## 2016-05-29 LAB — COMPREHENSIVE METABOLIC PANEL
ALT: 77 U/L — ABNORMAL HIGH (ref 17–63)
AST: 103 U/L — ABNORMAL HIGH (ref 15–41)
Albumin: 4.4 g/dL (ref 3.5–5.0)
Alkaline Phosphatase: 41 U/L (ref 38–126)
Anion gap: 12 (ref 5–15)
BUN: 6 mg/dL (ref 6–20)
CO2: 24 mmol/L (ref 22–32)
Calcium: 9.3 mg/dL (ref 8.9–10.3)
Chloride: 95 mmol/L — ABNORMAL LOW (ref 101–111)
Creatinine, Ser: 0.85 mg/dL (ref 0.61–1.24)
GFR calc Af Amer: >60 mL/min (ref >60)
GFR calc non Af Amer: >60 mL/min (ref >60)
Glucose, Bld: 101 mg/dL — ABNORMAL HIGH (ref 65–99)
Potassium: 4.1 mmol/L (ref 3.5–5.1)
Sodium: 131 mmol/L — ABNORMAL LOW (ref 135–145)
Total Bilirubin: 0.9 mg/dL (ref 0.3–1.2)
Total Protein: 8.3 g/dL — ABNORMAL HIGH (ref 6.5–8.1)

## 2016-05-29 LAB — CBC
HCT: 43 % (ref 39.0–52.0)
Hemoglobin: 13.9 g/dL (ref 13.0–17.0)
MCH: 26.4 pg (ref 26.0–34.0)
MCHC: 32.3 g/dL (ref 30.0–36.0)
MCV: 81.6 fL (ref 78.0–100.0)
Platelets: 138 10*3/uL — ABNORMAL LOW (ref 150–400)
RBC: 5.27 MIL/uL (ref 4.22–5.81)
RDW: 15.9 % — ABNORMAL HIGH (ref 11.5–15.5)
WBC: 5.3 10*3/uL (ref 4.0–10.5)

## 2016-05-29 LAB — ETHANOL: Alcohol, Ethyl (B): 383 mg/dL (ref <5)

## 2016-05-29 LAB — CK: Total CK: 641 U/L — ABNORMAL HIGH (ref 49–397)

## 2016-05-29 MED ORDER — SODIUM CHLORIDE 0.9 % IV BOLUS (SEPSIS)
1000.0000 mL | Freq: Once | INTRAVENOUS | Status: AC
  Administered 2016-05-29: 1000 mL via INTRAVENOUS

## 2016-05-29 NOTE — ED Notes (Signed)
Patient states "I'm starting to feel some better." No distress noted.

## 2016-05-29 NOTE — ED Notes (Signed)
Patient here with bilateral leg pain and cramps for several days, also request help for ETOH detox, last drink pta. No tremors, no distress

## 2016-05-29 NOTE — ED Notes (Signed)
Heat compress applied to L AC after IV removed. Site infiltrated, swollen/painful/cool to touch.

## 2016-05-29 NOTE — ED Provider Notes (Signed)
CSN: [id number]     Arrival date & time 05/29/16  1417 History   First MD Initiated Contact with Patient 05/29/16 1727     Chief Complaint  Patient presents with  . Leg Pain  . ETOH detox      (Consider location/radiation/quality/duration/timing/severity/associated sxs/prior Treatment) HPI Patient presents with 2 complaints. He states he's had one week of diffuse myalgias to his bilateral upper and lower extremities. Denies any trauma. States he walks constantly. There is been no swelling or redness to the extremities. Patient states he drinks > 12 beers daily for the last 2-3 months. He is requesting inpatient alcohol detox. Denies any other drug use. Denies hallucinations. Patient does not volunteer the information that he was evaluated for the same 4 days ago at [facility]. He was discharged with prescription for Librium. Patient last drank this afternoon after lunch. States he had 2 beers. Past Medical History  Diagnosis Date  . 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 Reflux Dx 2007  . Pancreatitis Dx 2014  . Anxiety     About 55 years of age  . Depression     At 55 years of age  . GERD (gastroesophageal reflux disease) Dx 2007  . Carpal tunnel syndrome of right wrist Dx 2015  . Alcohol abuse     Last Drink on Sep 2015  . Pneumonia 11/2014  . Hepatitis, alcoholic, acute 123456  . Hyperlipidemia     borderline, diet controlled  . 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 Knee arthroscopy    . Carpel tunnel release Right 07/28/2014     done in [place]   . Elbow surgery    . Knee surgery Left     15 years ago  . Lipoma excision N/A 05/10/2016    Procedure: EXCISION OF SCALP LIPOMA;  Surgeon: Johnathan Hausen, MD;  Location: [facility];  Service: General;  Laterality: N/A;   Family History  Problem Relation Age of Onset  . Diabetes Father   . Hypertension Father   . Hypertension Paternal Grandfather    . Alcohol abuse Paternal Grandfather   . Alcohol abuse Maternal Grandfather   . Alcohol abuse Maternal Grandmother   . Alcohol abuse Paternal Grandmother   . Colon cancer Neg Hx   . Rectal cancer Neg Hx   . Stomach cancer Neg Hx    Social History  Substance Use Topics  . Smoking status: Former Smoker    Types: Cigars  . Smokeless tobacco: Never Used     Comment: smokes occasional black &mild  . Alcohol Use: 0.0 oz/week    0 Standard drinks or equivalent per week     Comment: last ETOH 05-01-16 had beer    Review of Systems  Constitutional: Negative for fever and chills.  Respiratory: Positive for cough. Negative for shortness of breath.   Cardiovascular: Negative for chest pain, palpitations and leg swelling.  Gastrointestinal: Negative for nausea, vomiting, abdominal pain and diarrhea.  Genitourinary: Negative for dysuria, frequency and flank pain.  Musculoskeletal: Positive for myalgias. Negative for back pain, arthralgias and neck pain.  Skin: Negative for rash and wound.  Neurological: Negative for dizziness, tremors, weakness, light-headedness, numbness and headaches.  Psychiatric/Behavioral: Negative for suicidal ideas and hallucinations. The patient is not nervous/anxious.   All other systems reviewed and are negative.     Allergies  Oxycodone  Home Medications   Prior to Admission medications  Medication Sig Start Date End Date Taking? Authorizing Provider  albuterol (PROVENTIL HFA;VENTOLIN HFA) 108 (90 Base) MCG/ACT inhaler Inhale 2 puffs into the lungs every 6 (six) hours as needed for wheezing or shortness of breath. 02/11/16  Yes Josalyn Funches, MD  chlordiazePOXIDE (LIBRIUM) 25 MG capsule 50mg  PO TID x 1D, then 25-50mg  PO BID X 1D, then 25-50mg  PO QD X 1D Patient not taking: Reported on 05/29/2016 05/26/16   Tanna Furry, MD  chlordiazePOXIDE (LIBRIUM) 25 MG capsule 50mg  PO TID x 1D, then 25-50mg  PO BID X 1D, then 25-50mg  PO QD X 1D Patient not taking: Reported  on 05/29/2016 05/26/16   Tanna Furry, MD  escitalopram (LEXAPRO) 10 MG tablet Take 1 tablet (10 mg total) by mouth daily. Patient not taking: Reported on 05/26/2016 02/11/16   Boykin Nearing, MD  HYDROcodone-acetaminophen (NORCO) 5-325 MG tablet Take 1 tablet by mouth every 4 (four) hours as needed for moderate pain. Patient not taking: Reported on 05/26/2016 05/10/16   Johnathan Hausen, MD  oxymetazoline [facility] NASAL SPRAY) 0.05 % nasal spray Place 1 spray into both nostrils 2 (two) times daily. Patient not taking: Reported on 05/29/2016 02/13/16   Josalyn Funches, MD   BP 133/87 mmHg  Pulse 88  Temp(Src) 99.9 F (37.7 C) (Oral)  Resp 16  Ht 5\' 9"  (1.753 m)  Wt 130 lb (58.968 kg)  BMI 19.19 kg/m2  SpO2 94% Physical Exam  Constitutional: He is oriented to person, place, and time. He appears well-developed and well-nourished. No distress.  HENT:  Head: Normocephalic and atraumatic.  Mouth/Throat: Oropharynx is clear and moist. No oropharyngeal exudate.  Eyes: EOM are normal. Pupils are equal, round, and reactive to light.  Neck: Normal range of motion. Neck supple.  Cardiovascular: Normal rate and regular rhythm.  Exam reveals no gallop and no friction rub.   No murmur heard. Pulmonary/Chest: Effort normal and breath sounds normal. No respiratory distress. He has no wheezes. He has no rales. He exhibits no tenderness.  Abdominal: Soft. Bowel sounds are normal. He exhibits no distension and no mass. There is no tenderness. There is no rebound and no guarding.  Musculoskeletal: Normal range of motion. He exhibits tenderness. He exhibits no edema.  Patient with diffuse muscle tenderness to the quadriceps and calves bilaterally. There is no swelling or asymmetry. Distal pulses are equal and intact. No midline thoracic or lumbar tenderness.  Neurological: He is alert and oriented to person, place, and time.  5/5 motor in all extremities. Sensation is fully intact. Mild tremor that resolves with  distraction.  Skin: Skin is warm and dry. No rash noted. No erythema.  Psychiatric: He has a normal mood and affect. His behavior is normal.  Nursing note and vitals reviewed.   ED Course  Procedures (including critical care time) Labs Review Labs Reviewed  COMPREHENSIVE METABOLIC PANEL - Abnormal; Notable for the following:    Sodium 131 (*)    Chloride 95 (*)    Glucose, Bld 101 (*)    Total Protein 8.3 (*)    AST 103 (*)    ALT 77 (*)    All other components within normal limits  CBC - Abnormal; Notable for the following:    RDW 15.9 (*)    Platelets 138 (*)    All other components within normal limits  ETHANOL - Abnormal; Notable for the following:    Alcohol, Ethyl (B) 383 (*)    All other components within normal limits  CK - Abnormal; Notable for  the following:    Total CK 641 (*)    All other components within normal limits  URINE RAPID DRUG SCREEN, HOSP PERFORMED  URINALYSIS, ROUTINE W REFLEX MICROSCOPIC (NOT AT [facility])    Imaging Review Dg Chest 2 View  05/29/2016  CLINICAL DATA:  Bilateral leg pain and cramping several days. Generalized body aches. History of sarcoidosis. EXAM: CHEST  2 VIEW COMPARISON:  04/10/2016 and 12/27/2015 FINDINGS: Lungs are adequately inflated with increase coarse interstitial changes over the mid to upper lungs stable and compatible known sarcoidosis. No focal airspace consolidation or effusion. Cardiomediastinal silhouette and remainder of the exam is unchanged. IMPRESSION: No acute cardiopulmonary disease. Upper lobe prominent interstitial disease unchanged and compatible with known sarcoidosis. Electronically Signed   By: Marin Olp M.D.   On: 05/29/2016 18:27   I have personally reviewed and evaluated these images and lab results as part of my medical decision-making.   EKG Interpretation None      MDM   Final diagnoses:  Alcohol abuse  Dehydration  Myalgia   Patient states he is feeling much better after IV fluids. Heart  rate has decreased into the 70's. She has no evidence of withdrawal. States he still has the Librium prescription which has not been filled. I have encouraged him to fill this and will get outpatient resources. Encouraged to drink plenty of water. Return precautions have been given.     Julianne Rice, MD 05/29/16 [phone]

## 2016-05-29 NOTE — ED Notes (Signed)
Patient also states he has not eaten right since drinking daily, brought food and provided drink. Alert and oriented

## 2016-05-29 NOTE — Discharge Instructions (Signed)
Alcohol Use Disorder °Alcohol use disorder is a mental disorder. It is not a one-time incident of heavy drinking. Alcohol use disorder is the excessive and uncontrollable use of alcohol over time that leads to problems with functioning in one or more areas of daily living. People with this disorder risk harming themselves and others when they drink to excess. Alcohol use disorder also can cause other mental disorders, such as mood and anxiety disorders, and serious physical problems. People with alcohol use disorder often misuse other drugs.  °Alcohol use disorder is common and widespread. Some people with this disorder drink alcohol to cope with or escape from negative life events. Others drink to relieve chronic pain or symptoms of mental illness. People with a family history of alcohol use disorder are at higher risk of losing control and using alcohol to excess.  °Drinking too much alcohol can cause injury, accidents, and health problems. One drink can be too much when you are: °· Working. °· Pregnant or breastfeeding. °· Taking medicines. Ask your doctor. °· Driving or planning to drive. °SYMPTOMS  °Signs and symptoms of alcohol use disorder may include the following:  °· Consumption of alcohol in larger amounts or over a longer period of time than intended. °· Multiple unsuccessful attempts to cut down or control alcohol use.   °· A great deal of time spent obtaining alcohol, using alcohol, or recovering from the effects of alcohol (hangover). °· A strong desire or urge to use alcohol (cravings).   °· Continued use of alcohol despite problems at work, school, or home because of alcohol use.   °· Continued use of alcohol despite problems in relationships because of alcohol use. °· Continued use of alcohol in situations when it is physically hazardous, such as driving a car. °· Continued use of alcohol despite awareness of a physical or psychological problem that is likely related to alcohol use. Physical  problems related to alcohol use can involve the brain, heart, liver, stomach, and intestines. Psychological problems related to alcohol use include intoxication, depression, anxiety, psychosis, delirium, and dementia.   °· The need for increased amounts of alcohol to achieve the same desired effect, or a decreased effect from the consumption of the same amount of alcohol (tolerance). °· Withdrawal symptoms upon reducing or stopping alcohol use, or alcohol use to reduce or avoid withdrawal symptoms. Withdrawal symptoms include: °¨ Racing heart. °¨ Hand tremor. °¨ Difficulty sleeping. °¨ Nausea. °¨ Vomiting. °¨ Hallucinations. °¨ Restlessness. °¨ Seizures. °DIAGNOSIS °Alcohol use disorder is diagnosed through an assessment by your health care provider. Your health care provider may start by asking three or four questions to screen for excessive or problematic alcohol use. To confirm a diagnosis of alcohol use disorder, at least two symptoms must be present within a 12-month period. The severity of alcohol use disorder depends on the number of symptoms: °· Mild--two or three. °· Moderate--four or five. °· Severe--six or more. °Your health care provider may perform a physical exam or use results from lab tests to see if you have physical problems resulting from alcohol use. Your health care provider may refer you to a mental health professional for evaluation. °TREATMENT  °Some people with alcohol use disorder are able to reduce their alcohol use to low-risk levels. Some people with alcohol use disorder need to quit drinking alcohol. When necessary, mental health professionals with specialized training in substance use treatment can help. Your health care provider can help you decide how severe your alcohol use disorder is and what type of treatment you need.   The following forms of treatment are available:   Detoxification. Detoxification involves the use of prescription medicines to prevent alcohol withdrawal  symptoms in the first week after quitting. This is important for people with a history of symptoms of withdrawal and for heavy drinkers who are likely to have withdrawal symptoms. Alcohol withdrawal can be dangerous and, in severe cases, cause death. Detoxification is usually provided in a hospital or in-patient substance use treatment facility.  Counseling or talk therapy. Talk therapy is provided by substance use treatment counselors. It addresses the reasons people use alcohol and ways to keep them from drinking again. The goals of talk therapy are to help people with alcohol use disorder find healthy activities and ways to cope with life stress, to identify and avoid triggers for alcohol use, and to handle cravings, which can cause relapse.  Medicines.Different medicines can help treat alcohol use disorder through the following actions:  Decrease alcohol cravings.  Decrease the positive reward response felt from alcohol use.  Produce an uncomfortable physical reaction when alcohol is used (aversion therapy).  Support groups. Support groups are run by people who have quit drinking. They provide emotional support, advice, and guidance. These forms of treatment are often combined. Some people with alcohol use disorder benefit from intensive combination treatment provided by specialized substance use treatment centers. Both inpatient and outpatient treatment programs are available.   This information is not intended to replace advice given to you by your health care provider. Make sure you discuss any questions you have with your health care provider.   Document Released: 12/21/2004 Document Revised: 12/04/2014 Document Reviewed: 02/20/2013 Elsevier Interactive Patient Education 2016 Elsevier Inc.  Dehydration, Adult Dehydration is a condition in which you do not have enough fluid or water in your body. It happens when you take in less fluid than you lose. Vital organs such as the kidneys,  brain, and heart cannot function without a proper amount of fluids. Any loss of fluids from the body can cause dehydration.  Dehydration can range from mild to severe. This condition should be treated right away to help prevent it from becoming severe. CAUSES  This condition may be caused by:  Vomiting.  Diarrhea.  Excessive sweating, such as when exercising in hot or humid weather.  Not drinking enough fluid during strenuous exercise or during an illness.  Excessive urine output.  Fever.  Certain medicines. RISK FACTORS This condition is more likely to develop in:  People who are taking certain medicines that cause the body to lose excess fluid (diuretics).   People who have a chronic illness, such as diabetes, that may increase urination.  Older adults.   People who live at high altitudes.   People who participate in endurance sports.  SYMPTOMS  Mild Dehydration  Thirst.  Dry lips.  Slightly dry mouth.  Dry, warm skin. Moderate Dehydration  Very dry mouth.   Muscle cramps.   Dark urine and decreased urine production.   Decreased tear production.   Headache.   Light-headedness, especially when you stand up from a sitting position.  Severe Dehydration  Changes in skin.   Cold and clammy skin.   Skin does not spring back quickly when lightly pinched and released.   Changes in body fluids.   Extreme thirst.   No tears.   Not able to sweat when body temperature is high, such as in hot weather.   Minimal urine production.   Changes in vital signs.   Rapid, weak pulse (more  than 100 beats per minute when you are sitting still).   Rapid breathing.   Low blood pressure.   Other changes.   Sunken eyes.   Cold hands and feet.   Confusion.  Lethargy and difficulty being awakened.  Fainting (syncope).   Short-term weight loss.   Unconsciousness. DIAGNOSIS  This condition may be diagnosed based on your  symptoms. You may also have tests to determine how severe your dehydration is. These tests may include:   Urine tests.   Blood tests.  TREATMENT  Treatment for this condition depends on the severity. Mild or moderate dehydration can often be treated at home. Treatment should be started right away. Do not wait until dehydration becomes severe. Severe dehydration needs to be treated at the hospital. Treatment for Mild Dehydration  Drinking plenty of water to replace the fluid you have lost.   Replacing minerals in your blood (electrolytes) that you may have lost.  Treatment for Moderate Dehydration  Consuming oral rehydration solution (ORS). Treatment for Severe Dehydration  Receiving fluid through an IV tube.   Receiving electrolyte solution through a feeding tube that is passed through your nose and into your stomach (nasogastric tube or NG tube).  Correcting any abnormalities in electrolytes. HOME CARE INSTRUCTIONS   Drink enough fluid to keep your urine clear or pale yellow.   Drink water or fluid slowly by taking small sips. You can also try sucking on ice cubes.  Have food or beverages that contain electrolytes. Examples include bananas and sports drinks.  Take over-the-counter and prescription medicines only as told by your health care provider.   Prepare ORS according to the manufacturer's instructions. Take sips of ORS every 5 minutes until your urine returns to normal.  If you have vomiting or diarrhea, continue to try to drink water, ORS, or both.   If you have diarrhea, avoid:   Beverages that contain caffeine.   Fruit juice.   Milk.   Carbonated soft drinks.  Do not take salt tablets. This can lead to the condition of having too much sodium in your body (hypernatremia).  SEEK MEDICAL CARE IF:  You cannot eat or drink without vomiting.  You have had moderate diarrhea during a period of more than 24 hours.  You have a fever. SEEK  IMMEDIATE MEDICAL CARE IF:   You have extreme thirst.  You have severe diarrhea.  You have not urinated in 6-8 hours, or you have urinated only a small amount of very dark urine.  You have shriveled skin.  You are dizzy, confused, or both.   This information is not intended to replace advice given to you by your health care provider. Make sure you discuss any questions you have with your health care provider.   Document Released: 11/13/2005 Document Revised: 08/04/2015 Document Reviewed: 03/31/2015 Elsevier Interactive Patient Education 2016 Elsevier Inc.  Muscle Pain, Adult Muscle pain (myalgia) may be caused by many things, including:  Overuse or muscle strain, especially if you are not in shape. This is the most common cause of muscle pain.  Injury.  Bruises.  Viruses, such as the flu.  Infectious diseases.  Fibromyalgia, which is a chronic condition that causes muscle tenderness, fatigue, and headache.  Autoimmune diseases, including lupus.  Certain drugs, including ACE inhibitors and statins. Muscle pain may be mild or severe. In most cases, the pain lasts only a short time and goes away without treatment. To diagnose the cause of your muscle pain, your health care provider will  take your medical history. This means he or she will ask you when your muscle pain began and what has been happening. If you have not had muscle pain for very long, your health care provider may want to wait before doing much testing. If your muscle pain has lasted a long time, your health care provider may want to run tests right away. If your health care provider thinks your muscle pain may be caused by illness, you may need to have additional tests to rule out certain conditions.  Treatment for muscle pain depends on the cause. Home care is often enough to relieve muscle pain. Your health care provider may also prescribe anti-inflammatory medicine. HOME CARE INSTRUCTIONS Watch your condition for  any changes. The following actions may help to lessen any discomfort you are feeling:  Only take over-the-counter or prescription medicines as directed by your health care provider.  Apply ice to the sore muscle:  Put ice in a plastic bag.  Place a towel between your skin and the bag.  Leave the ice on for 15-20 minutes, 3-4 times a day.  You may alternate applying hot and cold packs to the muscle as directed by your health care provider.  If overuse is causing your muscle pain, slow down your activities until the pain goes away.  Remember that it is normal to feel some muscle pain after starting a workout program. Muscles that have not been used often will be sore at first.  Do regular, gentle exercises if you are not usually active.  Warm up before exercising to lower your risk of muscle pain.  Do not continue working out if the pain is very bad. Bad pain could mean you have injured a muscle. SEEK MEDICAL CARE IF:  Your muscle pain gets worse, and medicines do not help.  You have muscle pain that lasts longer than 3 days.  You have a rash or fever along with muscle pain.  You have muscle pain after a tick bite.  You have muscle pain while working out, even though you are in good physical condition.  You have redness, soreness, or swelling along with muscle pain.  You have muscle pain after starting a new medicine or changing the dose of a medicine. SEEK IMMEDIATE MEDICAL CARE IF:  You have trouble breathing.  You have trouble swallowing.  You have muscle pain along with a stiff neck, fever, and vomiting.  You have severe muscle weakness or cannot move part of your body. MAKE SURE YOU:   Understand these instructions.  Will watch your condition.  Will get help right away if you are not doing well or get worse.   This information is not intended to replace advice given to you by your health care provider. Make sure you discuss any questions you have with your  health care provider.   Document Released: 10/05/2006 Document Revised: 12/04/2014 Document Reviewed: 09/09/2013 Elsevier Interactive Patient Education 2016 [employer]. Substance Abuse Treatment Programs  Intensive Outpatient Programs [facility]     [address], [place]       [facility] [place] [address] [place], [place]     (Inpatient and outpatient)     [address]  [place] [phone] (Suboxone and Methadone)  [place], [place] [address]      [phone]       [id number] [place], [place]  [facility] [place] [facility] ([place], [place])    (insurance only) [phone]             [facility] ([place]) [place], [place]     [facility]     [address]     [place], [place]       [facility] (for caregivers and family) [phone] [address] Kristeen Mans. [place], [place]      Residential Treatment Programs [facility]      [address], [place], [place] [address]  [phone]       [facility] [address], [place], [place] [address] [phone]  [facility]        [phone]       [facility] [place] [facility] [phone]  [facility] ([place]             [place], [place] or [place] of [place], [address] [phone]  [facility]    [address], [place]       [facility] [address] [place], [place]  [place]   [address] [place], [place] [address]     [phone]      Admissions: 8am-3pm M-F  [facility] ([facility]) [address] [place], [place]  [facility] Program: Residential Program [phone] Days)   [place], [place] or [phone]     [facility]: [facility] [place], [place] (Walk in Hours over the weekend or by referral)  [facility] Terrell Valier, [place], [place] [address] [phone]  Crisis Mobile: [facility]:  [phone] (for crisis response 24 hours a day) [facility] Hotline:      5078307810 Outpatient Psychiatry and Counseling  [facility]: Mobile Crisis Management 24 hours:  [phone]  [facility] [employer] sliding scale fee and walk in schedule: M-F 8am-12pm/1pm-3pm [place], [place] [address] [place], [place] [address] [phone]  [facility] ([facility] [employer])- new patient walk-in appointments available Monday - Friday 8am -3pm.          [address] [place], [place] [address] [phone] or crisis line- [facility]/ Intensive Outpatient Therapy Program [place],  [place] [address] [phone]  [facility]                  Crisis Services      [phone] [address] [place], [place] [address]                 [place]   [facility] [phone]. [place], [place] [address]   [employer] [facility] Care          [address] Johnette Abraham  [place], [place] [address]       [phone]  [facility] [address], [place], [place] [address] [phone]  [facility]    [address] [place], [place]  [address]     [place], [place] Joycelyn Man     [place] [address]     [phone]       [facility] [place] [address]  [facility]     [address], [place], [place], [place] [address] [place]     [address]     [place], [place] [address]     [phone]       [facility] [address] [place], [place] [address] [phone] Resources for Temporary Residential Assistance/Crisis [facility]) M-F 8am-3pm   [address], [place] [address]   [phone] Services include: laundry, barbering, support groups, case management, phone  & computer access, showers, AA/NA mtgs, mental health/substance abuse nurse, job skills class, disability information, VA assistance, spiritual classes, etc.   [facility]   [address], [place]     [place]              [employer] (women and children)       [place]. [place], [place] [address] [phone] [email or web address] for application and process Application Required  [facility]   [address]    [place] [address]     [phone]                    [place], [place] [address] [id number] application appt.) Application Required  [facility] (women only)    [address]     [place], [place] [address]     [phone]      Intake starts 6pm daily Need valid ID, SSC, & Police report [employer] [address] [place],  [id number] Application Required  [employer] (men only)     [place].       [place], [place]       [place] (Pregnant women only) [address] [place], [place]  [facility]      [address].      [place], [place]  [place] [address] [place], [place] 90 day commitment/SA/Application process  [facility](men only)     [address]     [place], [place]       Check-in at [facility] [address] [place], [place] [address] [phone] Men/Women/Women and Children must be there by 7 pm  [place], [place]

## 2016-06-13 MED FILL — ESCITALOPRAM 10 MG TABLET: 10 | 30 days supply | Qty: 30 | Fill #3

## 2016-06-29 ENCOUNTER — Encounter (HOSPITAL_COMMUNITY): Payer: Self-pay | Admitting: *Deleted

## 2016-06-29 ENCOUNTER — Telehealth: Payer: Self-pay | Admitting: Family Medicine

## 2016-06-29 ENCOUNTER — Emergency Department (HOSPITAL_COMMUNITY)
Admission: EM | Admit: 2016-06-29 | Discharge: 2016-06-30 | Disposition: A | Discharge status: Home / Self Care | Payer: BLUE CROSS/BLUE SHIELD | Attending: Emergency Medicine | Admitting: Emergency Medicine

## 2016-06-29 DIAGNOSIS — Z87891 Personal history of nicotine dependence: Secondary | ICD-10-CM | POA: Insufficient documentation

## 2016-06-29 DIAGNOSIS — F1010 Alcohol abuse, uncomplicated: Secondary | ICD-10-CM

## 2016-06-29 DIAGNOSIS — F10129 Alcohol abuse with intoxication, unspecified: Secondary | ICD-10-CM | POA: Insufficient documentation

## 2016-06-29 DIAGNOSIS — Z79899 Other long term (current) drug therapy: Secondary | ICD-10-CM | POA: Diagnosis not present

## 2016-06-29 LAB — CBC
HCT: 38.3 % — ABNORMAL LOW (ref 39.0–52.0)
Hemoglobin: 12.3 g/dL — ABNORMAL LOW (ref 13.0–17.0)
MCH: 25.4 pg — ABNORMAL LOW (ref 26.0–34.0)
MCHC: 32.1 g/dL (ref 30.0–36.0)
MCV: 79 fL (ref 78.0–100.0)
Platelets: 210 10*3/uL (ref 150–400)
RBC: 4.85 MIL/uL (ref 4.22–5.81)
RDW: 16.2 % — ABNORMAL HIGH (ref 11.5–15.5)
WBC: 5.3 10*3/uL (ref 4.0–10.5)

## 2016-06-29 LAB — COMPREHENSIVE METABOLIC PANEL
ALT: 20 U/L (ref 17–63)
AST: 37 U/L (ref 15–41)
Albumin: 4.2 g/dL (ref 3.5–5.0)
Alkaline Phosphatase: 46 U/L (ref 38–126)
Anion gap: 9 (ref 5–15)
BUN: 10 mg/dL (ref 6–20)
CO2: 28 mmol/L (ref 22–32)
Calcium: 8.7 mg/dL — ABNORMAL LOW (ref 8.9–10.3)
Chloride: 96 mmol/L — ABNORMAL LOW (ref 101–111)
Creatinine, Ser: 0.94 mg/dL (ref 0.61–1.24)
GFR calc Af Amer: >60 mL/min (ref >60)
GFR calc non Af Amer: >60 mL/min (ref >60)
Glucose, Bld: 125 mg/dL — ABNORMAL HIGH (ref 65–99)
Potassium: 4.1 mmol/L (ref 3.5–5.1)
Sodium: 133 mmol/L — ABNORMAL LOW (ref 135–145)
Total Bilirubin: 0.5 mg/dL (ref 0.3–1.2)
Total Protein: 7.4 g/dL (ref 6.5–8.1)

## 2016-06-29 LAB — RAPID URINE DRUG SCREEN, HOSP PERFORMED
Amphetamines: NOT DETECTED
Barbiturates: NOT DETECTED
Benzodiazepines: NOT DETECTED
Cocaine: NOT DETECTED
Opiates: NOT DETECTED
Tetrahydrocannabinol: NOT DETECTED

## 2016-06-29 LAB — ETHANOL: Alcohol, Ethyl (B): 370 mg/dL (ref <5)

## 2016-06-29 MED ORDER — SODIUM CHLORIDE 0.9 % IV BOLUS (SEPSIS)
1000.0000 mL | Freq: Once | INTRAVENOUS | Status: AC
  Administered 2016-06-29: 1000 mL via INTRAVENOUS

## 2016-06-29 NOTE — Telephone Encounter (Signed)
Medication Refill: chlordiazePOXIDE (LIBRIUM) 25 MG capsule  Pt states he is in urgent need of this medication   Pt was informed of the last notes concerning refills stating that the medication is not meant for long term use and that this may mean it may not be refilled.   Pt understands and would like to be contacted if Rx can be refilled or not. Pt states he does not know if he can go without Rx until OV on Monday

## 2016-06-29 NOTE — ED Provider Notes (Signed)
[facility] DEPT Provider Note   CSN: [id number] Arrival date & time: 06/29/16  1946  By signing my name below, I, Irene Pap, attest that this documentation has been prepared under the direction and in the presence of Ezequiel Essex, MD. Electronically Signed: Irene Pap, ED Scribe. 06/29/16. 11:47 PM.  First Provider Contact:  None  First MD Initiated Contact with Patient 06/29/16 2335      History   Chief Complaint Chief Complaint  Patient presents with  . Alcohol Problem   The history is provided by the patient. No language interpreter was used.  HPI Comments: [name] [name] is a 55 y.o. male with a hx of alcohol abuse, anxiety, hepatitis, pancreatitis, and sarcoidosis of lung who presents to the Emergency Department requesting alcohol detox. Pt states that his last drink was about 10 hours ago. He reports that he typically drinks beer, four 40 ounces a day. He states that he was "doing good" and did not drink for almost two years, but relapsed one month ago. He reports associated decreased appetite, generalized fatigue, mild abdominal pain, diarrhea x2 a day, and "the shakes." Pt states that his diarrhea worsens when he walks around, like he has to go right away. Pt reports bilateral leg pain. He states that he fell recently but did not hit his head. He is complaining of the most pain on his left ankle. He also reports feeling depressed. He has been on Lexapro for anxiety, but states it does not mix well with the alcohol. Pt states that he was started on Librium to start detoxing. He denies other drug use, nausea, vomiting, hematochezia, SI, HI, LOC, or hallucinations.   PCP: Minerva Ends, MD  Past Medical History:  Diagnosis Date  . Alcohol abuse    Last Drink on Sep 2015  . Anxiety    About 55 years of age  . Carpal tunnel syndrome of right wrist Dx 2015  . Depression    At 55 years of age  . GERD (gastroesophageal reflux disease) Dx 2007  . Hepatitis, alcoholic,  acute 123456  . Hyperlipidemia    borderline, diet controlled  . Pancreatitis Dx 2014  . Pneumonia 11/2014  . 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 Reflux Dx 2007  . 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 CAP (community acquired pneumonia) 01/26/2016  . Bilateral knee pain 01/11/2016  . Loss of weight 09/27/2015  . Fatigue 09/27/2015  . Chronic pain of right wrist 07/07/2015  . Carpal tunnel syndrome of right wrist 05/20/2015  . Dislocation of right shoulder joint 05/20/2015  . Tear of medial meniscus of right knee 03/24/2015  . Epidermal cyst 02/22/2015  . 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 Bronchiectasis without acute exacerbation ([place]) 01/27/2015  . Chronic cough 01/15/2015  . Onychomycosis of toenail 12/15/2014  . Mild alcohol abuse in early remission 11/11/2014  . Seborrhea 11/11/2014  . GERD (gastroesophageal reflux disease)   . Generalized anxiety disorder 09/02/2013    Past Surgical History:  Procedure Laterality Date  . carpel tunnel release Right 07/28/2014    done in [place]   . ELBOW SURGERY    . KNEE ARTHROSCOPY    . KNEE SURGERY Left    15 years ago  . LIPOMA EXCISION N/A 05/10/2016   Procedure: EXCISION OF SCALP LIPOMA;  Surgeon: Johnathan Hausen, MD;  Location: [facility];  Service: General;  Laterality: N/A;       Home Medications    Prior to Admission medications   Medication Sig Start Date End Date Taking? Authorizing Provider  albuterol (PROVENTIL HFA;VENTOLIN HFA) 108 (90 Base) MCG/ACT inhaler Inhale 2 puffs into the lungs every 6 (six) hours as needed for wheezing or shortness of breath. 02/11/16   Josalyn Funches, MD  chlordiazePOXIDE (LIBRIUM) 25 MG capsule 50mg  PO TID x 1D, then 25-50mg  PO BID X 1D, then 25-50mg  PO QD X 1D Patient not taking: Reported on 05/29/2016 05/26/16   Tanna Furry, MD  chlordiazePOXIDE (LIBRIUM) 25 MG  capsule 50mg  PO TID x 1D, then 25-50mg  PO BID X 1D, then 25-50mg  PO QD X 1D Patient not taking: Reported on 05/29/2016 05/26/16   Tanna Furry, MD  escitalopram (LEXAPRO) 10 MG tablet Take 1 tablet (10 mg total) by mouth daily. Patient not taking: Reported on 05/26/2016 02/11/16   Boykin Nearing, MD  HYDROcodone-acetaminophen (NORCO) 5-325 MG tablet Take 1 tablet by mouth every 4 (four) hours as needed for moderate pain. Patient not taking: Reported on 05/26/2016 05/10/16   Johnathan Hausen, MD  oxymetazoline [facility] NASAL SPRAY) 0.05 % nasal spray Place 1 spray into both nostrils 2 (two) times daily. Patient not taking: Reported on 05/29/2016 02/13/16   Boykin Nearing, MD    Family History Family History  Problem Relation Age of Onset  . Diabetes Father   . Hypertension Father   . Hypertension Paternal Grandfather   . Alcohol abuse Paternal Grandfather   . Alcohol abuse Maternal Grandfather   . Alcohol abuse Maternal Grandmother   . Alcohol abuse Paternal Grandmother   . Colon cancer Neg Hx   . Rectal cancer Neg Hx   . Stomach cancer Neg Hx     Social History Social History  Substance Use Topics  . Smoking status: Former Smoker    Types: Cigars  . Smokeless tobacco: Never Used     Comment: smokes occasional black &mild  . Alcohol use 0.0 oz/week     Comment: last ETOH 06/29/16 had beer     Allergies   Oxycodone   Review of Systems Review of Systems  Constitutional: Positive for appetite change and fatigue.  Gastrointestinal: Positive for abdominal pain and diarrhea. Negative for nausea and vomiting.  Musculoskeletal: Positive for arthralgias.  Neurological: Negative for syncope.  Psychiatric/Behavioral: Negative for hallucinations and suicidal ideas.  All other systems reviewed and are negative.    Physical Exam Updated Vital Signs BP 114/80   Pulse 67   Temp 98.4 F (36.9 C) (Oral)   Resp 18   SpO2 100%   Physical Exam  Constitutional: He is oriented to person, place,  and time.  disheveled  HENT:  Head: Normocephalic and atraumatic.  Mouth/Throat: Oropharynx is clear and moist. No oropharyngeal exudate.  Eyes: Conjunctivae and EOM are normal. Pupils are equal, round, and reactive to light.  Neck: Normal range of motion. Neck supple.  No meningismus.  Cardiovascular: Normal rate, regular rhythm, normal heart sounds and intact distal pulses.   No murmur heard. Pulmonary/Chest: Effort normal and breath sounds normal. No respiratory distress.  Abdominal: Soft. There is no tenderness. There is no rebound and no guarding.  Musculoskeletal: Normal range of motion. He exhibits no edema or tenderness.  Full ROM of bilateral lower extremities; no erythema; intact PT and DP pulses; compartments soft; mild, diffuse swelling of left ankle  Neurological: He is alert and oriented to person, place, and time. No cranial nerve deficit. He  exhibits normal muscle tone. Coordination normal.  Not tremulous; 5/5 strength throughout. CN 2-12 intact.Equal grip strength. Sensation intact.   Skin: Skin is warm.  Psychiatric:  Flat affect  Nursing note and vitals reviewed.    ED Treatments / Results  DIAGNOSTIC STUDIES: Oxygen Saturation is 99% on RA, normal by my interpretation.    COORDINATION OF CARE: 11:42 PM-Discussed treatment plan which includes labs, x-ray and IV fluids with pt at bedside and pt agreed to plan.    Labs (all labs ordered are listed, but only abnormal results are displayed) Labs Reviewed  COMPREHENSIVE METABOLIC PANEL - Abnormal; Notable for the following:       Result Value   Sodium 133 (*)    Chloride 96 (*)    Glucose, Bld 125 (*)    Calcium 8.7 (*)    All other components within normal limits  ETHANOL - Abnormal; Notable for the following:    Alcohol, Ethyl (B) 370 (*)    All other components within normal limits  CBC - Abnormal; Notable for the following:    Hemoglobin 12.3 (*)    HCT 38.3 (*)    MCH 25.4 (*)    RDW 16.2 (*)    All  other components within normal limits  CK - Abnormal; Notable for the following:    Total CK 432 (*)    All other components within normal limits  URINE RAPID DRUG SCREEN, HOSP PERFORMED    EKG  EKG Interpretation None       Radiology Dg Ankle Complete Left  Result Date: 06/30/2016 CLINICAL DATA:  Left ankle pain after fall 3 days prior.  Edema. EXAM: LEFT ANKLE COMPLETE - 3+ VIEW COMPARISON:  None. FINDINGS: There is no evidence of fracture or dislocation. Tiny tibiotalar osteophytes. Ankle mortise is preserved. There is lateral soft tissue edema. Possible tibial talar joint effusion. IMPRESSION: Soft tissue edema and possible joint effusion. No acute fracture or subluxation. Electronically Signed   By: Jeb Levering M.D.   On: 06/30/2016 00:27    Procedures Procedures (including critical care time)  Medications Ordered in ED Medications  sodium chloride 0.9 % bolus 1,000 mL (1,000 mLs Intravenous New Bag/Given 06/29/16 2358)     Initial Impression / Assessment and Plan / ED Course  I have reviewed the triage vital signs and the nursing notes.  Pertinent labs & imaging results that were available during my care of the patient were reviewed by me and considered in my medical decision making (see chart for details).  Clinical Course  Patient complains of alcohol intoxication and requesting detox. Also has poor appetite and bilateral leg pain that is an ongoing issue for him. Neurovascularly intact on exam without signs of infection  Left ankle x-rays negative. Labs show mild elevation of CK. Patient given IV hydration. Creatinine is normal.  Will have patient evaluated by TTS for alcohol abuse and depression.  Patient seen by TTS. Does not meet inpatient criteria. Recommend outpatient residential treatment. Patient requesting Librium. He is told that he cannot use Librium he is going to continue to drink. No evidence of life-threatening alcohol withdrawal at this  time.  Final Clinical Impressions(s) / ED Diagnoses   Final diagnoses:  Alcohol abuse  I personally performed the services described in this documentation, which was scribed in my presence. The recorded information has been reviewed and is accurate.    New Prescriptions New Prescriptions   No medications on file     Ezequiel Essex, MD 06/30/16  0919  

## 2016-06-29 NOTE — Telephone Encounter (Signed)
As per PCP notes he was on a Librium taper at his last visit in  04/2016 and should not be needing any more. He will need to follow with PCP regarding this

## 2016-06-29 NOTE — ED Triage Notes (Signed)
Pt requesting alcohol detox, states he has had little appetite. States his last drink was 8 hours ago. Denies drug use. Denies SI/HI.

## 2016-06-29 NOTE — Telephone Encounter (Signed)
Left message on patients voicemail for him to follow up up with PCP.

## 2016-06-30 ENCOUNTER — Emergency Department (HOSPITAL_COMMUNITY): Payer: BLUE CROSS/BLUE SHIELD

## 2016-06-30 LAB — CK: Total CK: 432 U/L — ABNORMAL HIGH (ref 49–397)

## 2016-06-30 MED ORDER — LORAZEPAM 1 MG PO TABS: 0.0000 mg | ORAL_TABLET | Freq: Four times a day (QID) | ORAL | Status: DC

## 2016-06-30 MED ORDER — THIAMINE HCL 100 MG/ML IJ SOLN: 100.0000 mg | Freq: Every day | INTRAMUSCULAR | Status: DC

## 2016-06-30 MED ORDER — CHLORDIAZEPOXIDE HCL 25 MG PO CAPS: ORAL_CAPSULE | ORAL | 0 refills | Status: DC

## 2016-06-30 MED ORDER — LORAZEPAM 1 MG PO TABS: 0.0000 mg | ORAL_TABLET | Freq: Two times a day (BID) | ORAL | Status: DC

## 2016-06-30 MED ORDER — VITAMIN B-1 100 MG PO TABS: 100.0000 mg | ORAL_TABLET | Freq: Every day | ORAL | Status: DC

## 2016-06-30 MED ORDER — ESCITALOPRAM OXALATE 10 MG PO TABS: 10.0000 mg | ORAL_TABLET | Freq: Every day | ORAL | Status: DC

## 2016-06-30 NOTE — ED Notes (Signed)
Pt continuously requesting Ativan to relax, order gotten for Ativan per [place] protocol, [place] of 2 a this time, EDP notified and consulted about given Ativan, MD considered no need for given Ativan at this time, pt's ETOH level of 370.

## 2016-06-30 NOTE — ED Notes (Signed)
Gave Pt [place] sandwich per [employer] RN

## 2016-06-30 NOTE — Discharge Instructions (Signed)
Substance Abuse Treatment Programs ° °Intensive Outpatient Programs °[facility]     °[address]      °[place], [place]                   °[phone]      ° °[facility] °[address] °[place], [place] °[phone] ° °[facility]     °(Inpatient and outpatient)     °[address]           °[phone]   ° °[facility] °[phone] (Suboxone and Methadone) ° °[address]      °[place], [place] [address]      °[phone]      ° °[address] °[place], [place] °[phone] ° °[facility] (Outpatient/Inpatient, [facility])    °(insurance only) [phone]      °       °[facility] (Groups & Residential) °[place], [place] °[phone] ° °   °[facility]     °[address]     °[place], [place]      °[phone]      ° °[facility] (for caregivers and family) °[address] °[place], [place] °336-299-4655 ° ° ° ° ° °Residential Treatment Programs °[facility]      °[address], [place], [place] [address]  °[phone]      ° °[facility] °[address] [place], [place] [address] °[phone] ° °[facility]        °[phone]      ° °[facility] °[phone] ° °[facility]             °[address]                                         °[place], [place]                                                °[phone] or 336-784-9470                              ° °[facility] [place] °[address] °[place], [address] °[phone] ° °[facility]    °[address]      °[place], [place]     °[phone]      ° °[facility] °[address] °[place], [place] °[phone] ° °[facility]   °[address]     °[place], [place] [address]     °[phone]      °Admissions: 8am-3pm M-F ° °[facility] °[address] °[place],  [place] °336-227-7417 ° °[facility]: Residential Program (90 Days)   °[place], [place]      °336-725-8389 or [phone]    ° °[facility] °[place], [place] °(Walk in Hours over the weekend or by referral) ° °[facility] °[address], [place],  [address] °([phone] ° °Crisis Mobile: [facility]:  1-877-626-1772 (for crisis response 24 hours a day) °[facility] Hotline:      1-800-256-2452 °Outpatient Psychiatry and Counseling ° °[facility]: Mobile Crisis   Management 24 hours:  1-877-626-1772 ° °[facility] sliding scale fee and walk in schedule: M-F 8am-12pm/1pm-3pm °[address]  °[place], [place] [address] °[phone] ° °[facility] °[address] °[place], [place] [address] °[phone] ° °[facility] ([facility])- new patient walk-in appointments available Monday - Friday 8am -3pm.          °[address] °[place], [place] [address] °336-676-6840 or crisis line- 336-676-6905 ° °[facility] Intensive Outpatient Therapy Program °[address] °[place], [place] [address] °336-832-9804 ° °[facility]                  °Crisis Services      °[phone]      °[address]     °[place], [place] [address]                ° °[facility]   °[facility] °[phone] °[address] °[place], [place] [address] ° ° °[facility]          °[address]  °[place], [place] [address]       °[phone] ° °[facility] °[address] °[place], [place] [address] °336-292-1510 ° °[facility]    °[address]    °[place], [place] [address]     °[phone]      ° °Parish McKinney, MD     °[address]     °[place] [address]     °336-282-1251     °  °[facility] °[address] °[place] [address] ° °[facility]     °[address]     °[place], [place]      °336-542-2076      ° °[facility] °Shamsher Ahluwalia, MD °[address] °[place], [place] [address] °[phone] ° °[facility]     °[address]     °[place], [place] [address]     °336-274-1237      ° °[facility] °[address] °[place], [place] [address] °336-854-4450 °Resources for Temporary Residential Assistance/Crisis Centers ° °DAY CENTERS °[facility] °M-F 8am-3pm   °[address] [place], [place] [address]   336-332-0824 °Services include: laundry, barbering, support groups, case management, phone  & computer access, showers, AA/NA mtgs, mental health/substance abuse nurse, job skills class, disability information, VA assistance, spiritual classes, etc.  ° °HOMELESS SHELTERS ° °[facility]     °[facility]   °[address], [place]     °[phone]       °       °[facility] (women and children)       °[address] °[place], [place] [address] °336-275-0820 °[email or web address] for application and process °Application Required ° °[facility]   °[address]    °[place] [address]     °[phone]       °             °[facility] °[address] °[place], [place] [address] °336.273.5572 °336-235-0363(schedule application appt.) °Application Required ° °[facility] (women only)    °[address]     °[place], [place] [address]     °336-884-1039      °  Intake starts 6pm daily °Need valid ID, SSC, & Police report °[facility] [place] °[address] °[place], [place] °336-881-5420 °Application Required ° °[facility] (men only)     °[address]      °[place], [place]     °[phone]      ° °[facility] °(Pregnant women only) °[address] °[place], [place] °336-275-0206 ° °[facility]      °[address]      °[place], [place] [address]     °[phone]      °       °[facility] °[address] °[place], [place] °336-723-1848 °90 day commitment/SA/Application process ° °[facility](men only)     °[address]     °[place], [place]     °[phone]       °Check-in at 7pm     °       °[facility] °[address] °[place], [place] [address] °336-248-6684 °Men/Women/Women and Children must be there by 7 pm ° °[facility] °[place], [place] °336-722-8721                ° °

## 2016-06-30 NOTE — BH Assessment (Addendum)
Tele Assessment Note   [name] [name] is an 55 y.o. male who presents voluntarily unaccompanied reporting symptoms of alcohol abuse.  Prior to ED visit, pt sts he has relapsed from sobriety for about 1 month and is consuming about 3-40 oz beers per day, everyday. Pt has previous diagnoses of alcohol use D/O, MDD, moderate and GAD (no panic attacks for 10 + years)..  Pt reports symptoms of depression including sadness, lower self esteem, and excessive guilt. Pt sts his strong religious faith sustains him. Pt also reports a hx of GAD with panic attacks but sts that his anxiety has improved and it has been over 10 years since he has had a panic attack. Pt denies SI, HI and AVH. Pt has no hx of SI or HI or aggression/legal issues. Pt sts he has had several DUIs with the last one occurring in 1999. 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    Pt currently lives at [facility] where he pays rent and has 1 roommate. Pt sts he has the emotional support of his mother and father who live in [place] and his 2 sons who live in [place], although he sts they are disappointed in his relapse.  Pt reports no current psychiatric medications but he is followed by his PCP at [facility]. Pt does not see a therapist currently.   Pt has fair insight and partially judgment. Pt's memory seems intact although he sts his "brain is foggy.". No history of physical, verbal, emotional or sexual abuse. Pt sts he averages about 3-4 hours sleep each night as he wakes up with his body craving alcohol. Pt sts he has had decreased appetite due to his alcohol consumption with no significant weight changes.  Pt's treatment history includes no OP treatment. IP history includes psychiatric hospitalizations for SA at [facility] (several times in 2015), [facility] and now, [facility]. Pt reports alcohol use including current use of at least 3-40 oz beers per day, everyday.  Pt's BAL was 370 and UDS was clear for all substances tested for in the ED today.  ? MSE: Pt is dressed in scrubs Pt seems calm and is oriented x4 with quiet speech and normal motor behavior. Eye contact is good. Pt's mood is stated as "disappointed in myself" and "somewhat depressed about my relapse."  Pt's affect appears blunted  Affect seems congruent with mood. Thought process is coherent and relevant. There is no indication pt is currently responding to internal stimuli or experiencing delusional thought content. Pt was cooperative  throughout assessment. Pt is currently able to contract for safety outside the hospital.    Diagnosis: Alcohol Use D/O; MDD, Recurrent, Moderate  Past Medical History:  Past Medical History:  Diagnosis Date  . Alcohol abuse    Last Drink on Sep 2015  . Anxiety    About 55 years of age  . Carpal tunnel syndrome of right wrist Dx 2015  . Depression    At 55 years of age  . GERD (gastroesophageal reflux disease) Dx 2007  . Hepatitis, alcoholic, acute 123456  . Hyperlipidemia    borderline, diet controlled  . Pancreatitis Dx 2014  . Pneumonia 11/2014  . 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 Reflux Dx 2007  . 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 carpel tunnel release Right 07/28/2014    done in [place]   . ELBOW SURGERY    . KNEE ARTHROSCOPY    . KNEE SURGERY Left    15 years ago  . LIPOMA EXCISION N/A 05/10/2016   Procedure: EXCISION OF SCALP LIPOMA;  Surgeon: Johnathan Hausen, MD;  Location: [facility];  Service: General;  Laterality: N/A;    Family History:  Family History  Problem Relation Age of Onset  . Diabetes Father   . Hypertension Father   . Hypertension Paternal Grandfather   . Alcohol abuse Paternal Grandfather   . Alcohol abuse Maternal Grandfather    . Alcohol abuse Maternal Grandmother   . Alcohol abuse Paternal Grandmother   . Colon cancer Neg Hx   . Rectal cancer Neg Hx   . Stomach cancer Neg Hx     Social History:  reports that he has quit smoking. His smoking use included Cigars. He has never used smokeless tobacco. He reports that he drinks alcohol. He reports that he does not use drugs.  Additional Social History:  Alcohol / Drug Use Prescriptions: see MAR History of alcohol / drug use?: Yes Longest period of sobriety (when/how long): 1 1/2 years Substance #1 Name of Substance 1: Alcohol 1 - Age of First Use: teens 1 - Amount (size/oz): 3-40 oz beers daily 1 - Frequency: daily 1 - Duration: ongoing 1 - Last Use / Amount: 06/29/16  CIWA: CIWA-Ar BP: 133/90 Pulse Rate: 64 Nausea and Vomiting: no nausea and no vomiting Tactile Disturbances: none Tremor: two Auditory Disturbances: not present Paroxysmal Sweats: no sweat visible Visual Disturbances: not present Anxiety: no anxiety, at ease Headache, Fullness in Head: none present Agitation: normal activity Orientation and Clouding of Sensorium: oriented and can do serial additions CIWA-Ar Total: 2 COWS:    PATIENT STRENGTHS: (choose at least two) Average or above average intelligence Capable of independent living Communication skills Religious Affiliation Supportive family/friends  Allergies:  Allergies  Allergen Reactions  . Oxycodone Other (See Comments)    abd pain, stomach cramps     Home Medications:  (Not in a hospital admission)  OB/GYN Status:  No LMP for male patient.  General Assessment Data Location of Assessment: [facility] ED TTS Assessment: In system Is this a Tele or Face-to-Face Assessment?: Tele Assessment Is this an Initial Assessment or a Re-assessment for this encounter?: Initial Assessment Marital status: Separated Living Arrangements: Non-relatives/Friends (lives at [facility]) Can pt return to current living arrangement?:  Yes Admission Status: Voluntary Is patient capable of signing voluntary admission?: Yes Referral Source: Self/Family/Friend Insurance type:  Nurse, mental health)  Medical Screening Exam ([place]) Medical Exam completed: Yes  Crisis Care Plan Living Arrangements: Non-relatives/Friends (lives at [facility]) Name of Psychiatrist:  (no psychiatrist- [place]) Name of Therapist:  (none)     Risk to self with the past 6 months Suicidal Ideation: No (denies) Has patient been a risk to self within the past 6 months prior to admission? : No Suicidal Intent: No Has patient had any suicidal intent within the past 6 months prior to admission? : No Is patient at risk for suicide?: No Suicidal Plan?: No Has patient had any suicidal plan within the past 6 months prior to admission? : No Access to Means: No What has been your use of drugs/alcohol within the last 12 months?:  (daily alcohol use) Previous Attempts/Gestures: No How many times?:  (none) Other Self Harm Risks:  (none  noted) Triggers for Past Attempts: None known Intentional Self Injurious Behavior: None Family Suicide History: No (Family hx of alcohol abuse-GF and uncles) Recent stressful life event(s): Trauma (Comment), Other (Comment) (Job Overwork; Murder of a friend & Anxiety) Persecutory voices/beliefs?: No Depression: Yes Depression Symptoms: Isolating, Fatigue, Guilt, Loss of interest in usual pleasures, Feeling worthless/self pity Substance abuse history and/or treatment for substance abuse?: Yes Suicide prevention information given to non-admitted patients: Not applicable  Risk to Others within the past 6 months Homicidal Ideation: No (denies) Does patient have any lifetime risk of violence toward others beyond the six months prior to admission? : No (denies) Thoughts of Harm to Others: No Current Homicidal Intent: No Current Homicidal Plan: No Access to Homicidal Means: No Identified Victim:  (none  noted) History of harm to others?: No Assessment of Violence: None Noted Does patient have access to weapons?: No Criminal Charges Pending?: No (denies hx of violent crimes; several DUIs/last in 1999) Does patient have a court date: No Is patient on probation?: No  Psychosis Hallucinations: None noted (denies/never) Delusions: None noted  Mental Status Report Appearance/Hygiene: Unremarkable, In scrubs Eye Contact: Good Motor Activity: Freedom of movement Speech: Logical/coherent Level of Consciousness: Quiet/awake Mood: Depressed, Anxious Affect: Blunted Anxiety Level: Minimal Thought Processes: Coherent, Relevant Judgement: Impaired Orientation: Person, Place, Time, Situation Obsessive Compulsive Thoughts/Behaviors: None  Cognitive Functioning Concentration: Fair (sts feels "thinking is cloudy") Memory: Recent Intact, Remote Intact IQ: Average Insight: Fair Impulse Control: Poor Appetite: Poor Weight Loss:  (5) Weight Gain:  (0) Sleep: Decreased (Decreased based on drinking alcohol) Total Hours of Sleep:  (3-4) Vegetative Symptoms: None  ADLScreening [facility] Assessment Services) Patient's cognitive ability adequate to safely complete daily activities?: Yes Patient able to express need for assistance with ADLs?: Yes Independently performs ADLs?: Yes (appropriate for developmental age)  Prior Inpatient Therapy Prior Inpatient Therapy: Yes Prior Therapy Dates:  (multiple) Prior Therapy Facilty/Provider(s):  ([facility]) Reason for Treatment:  IT trainer)  Prior Outpatient Therapy Prior Outpatient Therapy: No ("some in the past") Does patient have an ACCT team?: No Does patient have Intensive In-House Services?  : No Does patient have [facility] services? : No Does patient have P4CC services?: No  ADL Screening (condition at time of admission) Patient's cognitive ability adequate to safely complete daily activities?:  Yes Patient able to express need for assistance with ADLs?: Yes Independently performs ADLs?: Yes (appropriate for developmental age)       Abuse/Neglect Assessment (Assessment to be complete while patient is alone) Physical Abuse: Denies Verbal Abuse: Denies Sexual Abuse: Denies Exploitation of patient/patient's resources: Denies Self-Neglect: Denies     Regulatory affairs officer (For Healthcare) Does patient have an advance directive?: No Would patient like information on creating an advanced directive?: No - patient declined information    Additional Information 1:1 In Past 12 Months?: No CIRT Risk: No Elopement Risk: No Does patient have medical clearance?: Yes     Disposition:  Disposition Initial Assessment Completed for this Encounter: Yes Disposition of Patient: Other dispositions Other disposition(s): Other (Comment) (Pending review w [facility] Extender)  Per Darlyne Russian, PA: Recommend SA tx. Does not meet IP MH criteria. Seek outside SA placement. Refer to SW.  Advised EDP of recommendation.    Faylene Kurtz, MS, CRC, [place] Triage Specialist [facility] T 06/30/2016 5:23 AM

## 2016-07-03 ENCOUNTER — Ambulatory Visit: Payer: Self-pay | Admitting: Family Medicine

## 2016-07-14 ENCOUNTER — Ambulatory Visit: Payer: Self-pay | Admitting: Family Medicine

## 2016-07-14 MED FILL — ESCITALOPRAM 10 MG TABLET: 10 | 30 days supply | Qty: 30 | Fill #4 | Status: TO

## 2016-07-21 ENCOUNTER — Ambulatory Visit (HOSPITAL_COMMUNITY)
Admission: EM | Admit: 2016-07-21 | Discharge: 2016-07-21 | Disposition: A | Discharge status: Home / Self Care | Payer: Commercial Managed Care - HMO | Attending: Family Medicine | Admitting: Family Medicine

## 2016-07-21 ENCOUNTER — Encounter (HOSPITAL_COMMUNITY): Payer: Self-pay | Admitting: *Deleted

## 2016-07-21 DIAGNOSIS — F1019 Alcohol abuse with unspecified alcohol-induced disorder: Secondary | ICD-10-CM | POA: Diagnosis not present

## 2016-07-21 MED ORDER — CHLORDIAZEPOXIDE HCL 25 MG PO CAPS: ORAL_CAPSULE | ORAL | 0 refills | Status: DC

## 2016-07-21 NOTE — ED Triage Notes (Signed)
Pt  Wants  help with   etoh    Problem  He  Has  An  Empty  Bottle  Of  Pills  In  The  Room     He states  His last  Drink  Was  Today         He was  Seen    3  Weeks  Ago     He  Is  Awake  Alert  He  Is  Not  Shaking  He  Is  Speaking  In  Slow  Precise    sentances

## 2016-07-21 NOTE — ED Provider Notes (Signed)
[name]    CSN: [id number] Arrival date & time: 07/21/16  1302  First Provider Contact:  First MD Initiated Contact with Patient 07/21/16 1350        History   Chief Complaint Chief Complaint  Patient presents with  . Alcohol Problem    HPI [name] [name] is a 55 y.o. male.   The history is provided by the patient.  Alcohol Problem  This is a chronic problem. The current episode started yesterday. The problem has been gradually worsening (backsliding after clean for 1 yr according to pt. seen earlier this mo and given librium, no AA plans but appt with medical doctor next week.). Pertinent negatives include no chest pain and no abdominal pain.    Past Medical History:  Diagnosis Date  . Alcohol abuse    Last Drink on Sep 2015  . Anxiety    About 55 years of age  . Carpal tunnel syndrome of right wrist Dx 2015  . Depression    At 55 years of age  . GERD (gastroesophageal reflux disease) Dx 2007  . Hepatitis, alcoholic, acute 123456  . Hyperlipidemia    borderline, diet controlled  . Pancreatitis Dx 2014  . Pneumonia 11/2014  . 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 Reflux Dx 2007  . Sarcoidosis of lung [facility]) Dx 1989    Patient Active Problem List   Diagnosis Date Noted  . 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 CAP (community acquired pneumonia) 01/26/2016  . Bilateral knee pain 01/11/2016  . Loss of weight 09/27/2015  . Fatigue 09/27/2015  . Chronic pain of right wrist 07/07/2015  . Carpal tunnel syndrome of right wrist 05/20/2015  . Dislocation of right shoulder joint 05/20/2015  . Tear of medial meniscus of right knee 03/24/2015  . Epidermal cyst 02/22/2015  . 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 Bronchiectasis without acute exacerbation ([place]) 01/27/2015  . Chronic cough 01/15/2015  . Onychomycosis of toenail 12/15/2014  . Mild alcohol abuse in early remission 11/11/2014  . Seborrhea 11/11/2014  . GERD (gastroesophageal reflux  disease)   . Generalized anxiety disorder 09/02/2013    Past Surgical History:  Procedure Laterality Date  . carpel tunnel release Right 07/28/2014    done in [place]   . ELBOW SURGERY    . KNEE ARTHROSCOPY    . KNEE SURGERY Left    15 years ago  . LIPOMA EXCISION N/A 05/10/2016   Procedure: EXCISION OF SCALP LIPOMA;  Surgeon: Johnathan Hausen, MD;  Location: [place] [facility];  Service: General;  Laterality: N/A;       Home Medications    Prior to Admission medications   Medication Sig Start Date End Date Taking? Authorizing Provider  albuterol (PROVENTIL HFA;VENTOLIN HFA) 108 (90 Base) MCG/ACT inhaler Inhale 2 puffs into the lungs every 6 (six) hours as needed for wheezing or shortness of breath. 02/11/16   Josalyn Funches, MD  chlordiazePOXIDE (LIBRIUM) 25 MG capsule 50mg  PO TID x 1D, then 25-50mg  PO BID X 1D, then 25-50mg  PO QD X 1D 06/30/16   Ezequiel Essex, MD  escitalopram (LEXAPRO) 10 MG tablet Take 1 tablet (10 mg total) by mouth daily. 02/11/16   Boykin Nearing, MD    Family History Family History  Problem Relation Age of Onset  . Diabetes Father   . Hypertension Father   . Hypertension Paternal Grandfather   . Alcohol abuse Paternal Grandfather   . Alcohol abuse Maternal Grandfather   . Alcohol abuse Maternal Grandmother   .  Alcohol abuse Paternal Grandmother   . Colon cancer Neg Hx   . Rectal cancer Neg Hx   . Stomach cancer Neg Hx     Social History Social History  Substance Use Topics  . Smoking status: Former Smoker    Types: Cigars  . Smokeless tobacco: Never Used     Comment: smokes occasional black &mild  . Alcohol use 0.0 oz/week     Comment: last ETOH 06/29/16 had beer     Allergies   Oxycodone   Review of Systems Review of Systems  Constitutional: Negative.   Cardiovascular: Negative for chest pain.  Gastrointestinal: Negative for abdominal pain, nausea and vomiting.  All other systems reviewed and are negative.    Physical  Exam Triage Vital Signs ED Triage Vitals [07/21/16 1319]  Enc Vitals Group     BP 114/73     Pulse Rate 88     Resp 16     Temp 98.4 F (36.9 C)     Temp Source Oral     SpO2 100 %     Weight      Height      Head Circumference      Peak Flow      Pain Score      Pain Loc      Pain Edu?      Excl. in [place]?    No data found.   Updated Vital Signs BP 114/73 (BP Location: Left Arm)   Pulse 88   Temp 98.4 F (36.9 C) (Oral)   Resp 16   SpO2 100%   Visual Acuity Right Eye Distance:   Left Eye Distance:   Bilateral Distance:    Right Eye Near:   Left Eye Near:    Bilateral Near:     Physical Exam  Constitutional: He appears well-developed and well-nourished.  Abdominal: Soft. Normal appearance and bowel sounds are normal. He exhibits no abdominal bruit, no ascites and no pulsatile midline mass. There is hepatomegaly. There is no hepatosplenomegaly.  Nursing note and vitals reviewed.    UC Treatments / Results  Labs (all labs ordered are listed, but only abnormal results are displayed) Labs Reviewed - No data to display  EKG  EKG Interpretation None       Radiology No results found.  Procedures Procedures (including critical care time)  Medications Ordered in UC Medications - No data to display   Initial Impression / Assessment and Plan / UC Course  I have reviewed the triage vital signs and the nursing notes.  Pertinent labs & imaging results that were available during my care of the patient were reviewed by me and considered in my medical decision making (see chart for details).  Clinical Course      Final Clinical Impressions(s) / UC Diagnoses   Final diagnoses:  None    New Prescriptions New Prescriptions   No medications on file     Billy Fischer, MD 07/21/16 1400

## 2016-07-28 ENCOUNTER — Encounter: Payer: Self-pay | Admitting: Family Medicine

## 2016-07-28 ENCOUNTER — Ambulatory Visit: Payer: Commercial Managed Care - HMO | Attending: Family Medicine | Admitting: Family Medicine

## 2016-07-28 DIAGNOSIS — F10129 Alcohol abuse with intoxication, unspecified: Secondary | ICD-10-CM

## 2016-07-28 DIAGNOSIS — F17200 Nicotine dependence, unspecified, uncomplicated: Secondary | ICD-10-CM | POA: Insufficient documentation

## 2016-07-28 DIAGNOSIS — Z23 Encounter for immunization: Secondary | ICD-10-CM

## 2016-07-28 MED ORDER — OXAZEPAM 15 MG PO CAPS: ORAL_CAPSULE | ORAL | 0 refills | Status: DC

## 2016-07-28 NOTE — Progress Notes (Signed)
C/C: very poor appetite, pt has other issues but rather talk to PCP.  Pt states that he feels threatened in his current living conditions.

## 2016-07-28 NOTE — Progress Notes (Signed)
Subjective:  Patient ID: [name] [name], male    DOB: [birth year]  Age: 55 y.o. MRN: [id number]  CC: Weight Loss   HPI [name] [name] has hx of sarcoidosis, ETOH abuse, R medial meniscus tear s/p repair    1. Alcohol abuse:  He is currently drinking 3, 40 oz per day for the past 2 months. He last drink today on his way to the appointment. He is ready to quit drinking. He would like to be admitted through the ED for alcohol detox. He is afraid of losing his job. He does not feel that he can stop alcohol with as an outpatient. He also fears his living situation reporting that is sober living house is in an unsafe neighborhood. He reports being weak, tried, overwhelmed, anxious, having chest discomfort. He has lost weight and this is distressing to him.    Past Surgical History:  Procedure Laterality Date  . carpel tunnel release Right 07/28/2014    done in [place]   . ELBOW SURGERY    . KNEE ARTHROSCOPY    . KNEE SURGERY Left    15 years ago  . LIPOMA EXCISION N/A 05/10/2016   Procedure: EXCISION OF SCALP LIPOMA;  Surgeon: Johnathan Hausen, MD;  Location: [facility];  Service: General;  Laterality: N/A;    Social History  Substance Use Topics  . Smoking status: Former Smoker    Types: Cigars  . Smokeless tobacco: Never Used     Comment: smokes occasional black &mild  . Alcohol use 0.0 oz/week     Comment: last ETOH 06/29/16 had beer    Outpatient Medications Prior to Visit  Medication Sig Dispense Refill  . albuterol (PROVENTIL HFA;VENTOLIN HFA) 108 (90 Base) MCG/ACT inhaler Inhale 2 puffs into the lungs every 6 (six) hours as needed for wheezing or shortness of breath. (Patient not taking: Reported on 07/28/2016) 1 Inhaler 2  . chlordiazePOXIDE (LIBRIUM) 25 MG capsule 1 bid (Patient not taking: Reported on 07/28/2016) 30 capsule 0  . escitalopram (LEXAPRO) 10 MG tablet Take 1 tablet (10 mg total) by mouth daily. (Patient not taking: Reported on 07/28/2016) 30 tablet 5   No  facility-administered medications prior to visit.     ROS Review of Systems  Constitutional: Positive for unexpected weight change. Negative for chills, fatigue and fever.  HENT: Negative for rhinorrhea.   Eyes: Negative for visual disturbance.  Respiratory: Negative for cough and shortness of breath.   Cardiovascular: Negative for chest pain, palpitations and leg swelling.  Gastrointestinal: Negative for abdominal pain, blood in stool, constipation, diarrhea, nausea and vomiting.  Endocrine: Negative for polydipsia, polyphagia and polyuria.  Musculoskeletal: Negative for arthralgias, back pain, gait problem, myalgias and neck pain.  Skin: Negative for rash.  Allergic/Immunologic: Negative for immunocompromised state.  Hematological: Negative for adenopathy. Does not bruise/bleed easily.  Psychiatric/Behavioral: Positive for dysphoric mood. Negative for sleep disturbance and suicidal ideas. The patient is nervous/anxious.     Objective:  BP 124/84 (BP Location: Right Arm, Patient Position: Sitting, Cuff Size: Small)   Pulse 83   Temp 98.9 F (37.2 C) (Oral)   Ht 5\' 9"  (1.753 m)   Wt 131 lb 9.6 oz (59.7 kg)   SpO2 98%   BMI 19.43 kg/m   BP/Weight 07/28/2016 [id number]  Systolic BP [id number]  Diastolic BP 84 73 88  Wt. (Lbs) 131.6 - -  BMI 19.43 - -  Some encounter information is confidential and restricted. Go to Review Flowsheets activity to  see all data.   Wt Readings from Last 3 Encounters:  07/28/16 131 lb 9.6 oz (59.7 kg)  05/29/16 130 lb (59 kg)  05/26/16 131 lb (59.4 kg)    Physical Exam  Constitutional: He appears well-developed. No distress.  Thin black male   HENT:  Head: Normocephalic and atraumatic.  Neck: Normal range of motion. Neck supple.  Cardiovascular: Normal rate, regular rhythm, normal heart sounds and intact distal pulses.   Pulmonary/Chest: Effort normal. He has no wheezes.  Abdominal: Soft. Bowel sounds are normal. He exhibits no  distension and no mass. There is no tenderness. There is no rebound and no guarding.  Musculoskeletal: He exhibits no edema.  Neurological: He is alert.  Skin: Skin is warm and dry. No rash noted. No erythema.  Psychiatric: His mood appears anxious. He exhibits a depressed mood.   Assessment & Plan:   There are no diagnoses linked to this encounter. Mikell was seen today for weight loss.  Diagnoses and all orders for this visit:  Alcohol abuse with intoxication ([place]) -     Discontinue: oxazepam (SERAX) 15 MG capsule; 15 mg ever 6 hrs for first 3 days, then 15 mg every 8 hrs for next 3 days, then twice daily for 3 days then STOP  Encounter for immunization -     Flu Vaccine QUAD 36+ mos IM   There are no diagnoses linked to this encounter. Meds ordered this encounter  Medications  . DISCONTD: oxazepam (SERAX) 15 MG capsule    Sig: 15 mg ever 6 hrs for first 3 days, then 15 mg every 8 hrs for next 3 days, then twice daily for 3 days then STOP    Dispense:  27 capsule    Refill:  0    Follow-up: No Follow-up on file.   Boykin Nearing MD

## 2016-07-28 NOTE — Patient Instructions (Addendum)
[name] [name] was seen today for weight loss.  Diagnoses and all orders for this visit:  Alcohol abuse with intoxication ([place]) -     oxazepam (SERAX) 15 MG capsule; 15 mg ever 6 hrs for first 3 days, then 15 mg every 8 hrs for next 3 days, then twice daily for 3 days then STOP   Attend [place] meetings or seek alcohol abuse counseling immediately  F/u in 10 days for alcohol detox   Dr. Adrian Blackwater   Finding Treatment for [place]? Addiction is a complex disease of the brain. It causes an uncontrollable (compulsive) need for a substance. You can be addicted to alcohol, illegal drugs, or prescription medicines such as painkillers. Addiction can also be a behavior, like gambling or shopping. The need for the drug or activity can become so strong that you think about it all the time. You can also become physically dependent on a substance. Addiction can change the way your brain works. Because of these changes, getting more of whatever you are addicted to becomes the most important thing to you and feels better than other activities or relationships. Addiction can lead to changes in health, behavior, emotions, relationships, and choices that affect you and everyone around you. HOW DO I KNOW IF I NEED TREATMENT FOR ADDICTION? Addiction is a progressive disease. Without treatment, addiction can get worse. Living with addiction puts you at higher risk for injury, poor health, lost employment, loss of money, and even death. You might need treatment for addiction if:  You have tried to stop or cut down, but you cannot.  Your addiction is causing physical health problems.  You find it annoying that your friends and family are concerned about your alcohol or substance use.  You feel guilty about substance abuse or a compulsive behavior.  You have lied or tried to hide your addiction.  You need a particular substance or activity to start your day or to calm down.  You are getting in trouble at  school, work, home, or with the police.  You have done something illegal to support your addiction.  You are running out of money because of your addiction.  You have no time for anything other than your addiction. WHAT TYPES OF TREATMENT ARE AVAILABLE? The treatment program that is right for you will depend on many factors, including the type of addiction you have. Treatment programs can be outpatient or inpatient. In an outpatient program, you live at home and go to work or school, but you also go to a clinic for treatment. With an inpatient program, you live and sleep at the program facility during treatment. After treatment, you might need a plan for support during recovery. Other treatment options include:   Medicine.  Some addictions may be treated with prescription medicines.  You might also need medicine to treat anxiety or depression.  Counseling and behavior therapy. Therapy can help individuals and families behave in healthier ways and relate more effectively.  Support groups. Confidential group therapy, such as a 12-step program, can help individuals and families during treatment and recovery. No single type of program is right for everyone. Many treatment programs involve a combination of education, counseling, and a 12-step, spiritually-based approach. Some treatment programs are government sponsored. They are geared for patients who do not have private insurance. Treatment programs can vary in many respects, such as:  Cost and types of insurance that are accepted.  Types of on-site medical services that are offered.  Length of  stay, setting, and size.  Overall philosophy of treatment. WHAT SHOULD I CONSIDER WHEN SELECTING A TREATMENT PROGRAM? It is important to think about your individual requirements when selecting a treatment program. There are a number of things to consider, such as:  If the program is certified by the appropriate government agency. Even private  programs must be certified and employ certified professionals.  If the program is covered by your insurance. If finances are a concern, the first call you should make is to your insurance company, if you have health insurance. Ask for a list of treatment programs that are in your network, and confirm any copayments and deductibles that you may have to pay.  If you do not have insurance, or if you choose to attend a program that does not accept your insurance, discuss whether a payment plan can be set up.  If treatment is available in languages other than English, if needed.  If the program offers detoxification treatment, if needed.  If 12-step meetings are held at the center or if transport is available for patients to attend meetings at other locations.  If the program is professional, organized, and clean.  If the program meets all of your needs, including physical and cultural needs.  If the facility offers specific treatment for your particular addiction.  If support continues to be offered after you have left the program.  If your treatment plan is continually looked at to make sure you are receiving the right treatment at the right time.  If mental health counseling is part of your treatment.  If medicine is included in treatment, if needed.  If your family is included in your treatment plan and if support is offered to them throughout the treatment process.  How the treatment works to prevent relapse. WHERE ELSE CAN I GET HELP?  Your health care provider. Ask him or her to help you find addiction treatment. These discussions are confidential.  The [employer] on Alcoholism and Drug Dependence (NCADD). This group has information about treatment centers and programs for people who have an addiction and for family members.  The telephone number is 1-800-NCA-CALL (360-632-1059).  The website is https://ncadd.org/about-ncadd/our-affiliates  The Substance Abuse and  Mental Health Services [facility]). This group will help you find publicly funded treatment centers, help hotlines, and counseling services near you.  The telephone number is 1-800-662-HELP (337)339-6839).  The website is www.findtreatment.[employer].com.cy In countries outside of the [place] and [place], look in [employer] for contact information for services in your area.   This information is not intended to replace advice given to you by your health care provider. Make sure you discuss any questions you have with your health care provider.   Document Released: 10/12/2005 Document Revised: 08/04/2015 Document Reviewed: 09/01/2014 Elsevier Interactive Patient Education [employer].

## 2016-07-29 ENCOUNTER — Encounter (HOSPITAL_COMMUNITY): Payer: Self-pay | Admitting: Emergency Medicine

## 2016-07-29 ENCOUNTER — Emergency Department (HOSPITAL_COMMUNITY)
Admission: EM | Admit: 2016-07-29 | Discharge: 2016-07-30 | Disposition: A | Discharge status: Home / Self Care | Payer: Commercial Managed Care - HMO | Attending: Emergency Medicine | Admitting: Emergency Medicine

## 2016-07-29 DIAGNOSIS — R072 Precordial pain: Secondary | ICD-10-CM | POA: Diagnosis not present

## 2016-07-29 DIAGNOSIS — Z862 Personal history of diseases of the blood and blood-forming organs and certain disorders involving the immune mechanism: Secondary | ICD-10-CM

## 2016-07-29 DIAGNOSIS — R0602 Shortness of breath: Secondary | ICD-10-CM | POA: Insufficient documentation

## 2016-07-29 DIAGNOSIS — Z79899 Other long term (current) drug therapy: Secondary | ICD-10-CM | POA: Insufficient documentation

## 2016-07-29 DIAGNOSIS — Z87891 Personal history of nicotine dependence: Secondary | ICD-10-CM | POA: Insufficient documentation

## 2016-07-29 DIAGNOSIS — R079 Chest pain, unspecified: Secondary | ICD-10-CM

## 2016-07-29 DIAGNOSIS — R0600 Dyspnea, unspecified: Secondary | ICD-10-CM

## 2016-07-29 NOTE — ED Triage Notes (Signed)
Pt states he is having 9/10 CP, SOB, no able to eat during the past few days, ETOH abuse last drink today at least 12 pack today. Denies any fever or chills, nausea or vomiting.

## 2016-07-30 ENCOUNTER — Emergency Department (HOSPITAL_COMMUNITY): Payer: Commercial Managed Care - HMO

## 2016-07-30 LAB — RAPID URINE DRUG SCREEN, HOSP PERFORMED
Amphetamines: NOT DETECTED
Barbiturates: NOT DETECTED
Benzodiazepines: POSITIVE — AB
Cocaine: NOT DETECTED
Opiates: NOT DETECTED
Tetrahydrocannabinol: NOT DETECTED

## 2016-07-30 LAB — COMPREHENSIVE METABOLIC PANEL
ALT: 16 U/L — ABNORMAL LOW (ref 17–63)
AST: 36 U/L (ref 15–41)
Albumin: 4 g/dL (ref 3.5–5.0)
Alkaline Phosphatase: 41 U/L (ref 38–126)
Anion gap: 5 (ref 5–15)
BUN: 8 mg/dL (ref 6–20)
CO2: 31 mmol/L (ref 22–32)
Calcium: 8.7 mg/dL — ABNORMAL LOW (ref 8.9–10.3)
Chloride: 103 mmol/L (ref 101–111)
Creatinine, Ser: 1.07 mg/dL (ref 0.61–1.24)
GFR calc Af Amer: >60 mL/min (ref >60)
GFR calc non Af Amer: >60 mL/min (ref >60)
Glucose, Bld: 84 mg/dL (ref 65–99)
Potassium: 3.8 mmol/L (ref 3.5–5.1)
Sodium: 139 mmol/L (ref 135–145)
Total Bilirubin: 0.4 mg/dL (ref 0.3–1.2)
Total Protein: 7.6 g/dL (ref 6.5–8.1)

## 2016-07-30 LAB — I-STAT TROPONIN, ED
Troponin i, poc: 0 ng/mL (ref 0.00–0.08)
Troponin i, poc: 0 ng/mL (ref 0.00–0.08)
Troponin i, poc: 0.01 ng/mL (ref 0.00–0.08)

## 2016-07-30 LAB — CBC
HCT: 38.9 % — ABNORMAL LOW (ref 39.0–52.0)
Hemoglobin: 12.4 g/dL — ABNORMAL LOW (ref 13.0–17.0)
MCH: 25.9 pg — ABNORMAL LOW (ref 26.0–34.0)
MCHC: 31.9 g/dL (ref 30.0–36.0)
MCV: 81.2 fL (ref 78.0–100.0)
Platelets: 164 10*3/uL (ref 150–400)
RBC: 4.79 MIL/uL (ref 4.22–5.81)
RDW: 16.5 % — ABNORMAL HIGH (ref 11.5–15.5)
WBC: 5.1 10*3/uL (ref 4.0–10.5)

## 2016-07-30 LAB — ETHANOL: Alcohol, Ethyl (B): 328 mg/dL (ref <5)

## 2016-07-30 NOTE — ED Provider Notes (Signed)
[facility] DEPT Provider Note   CSN: [id number] Arrival date & time: 07/29/16  2319     History   Chief Complaint Chief Complaint  Patient presents with  . Chest Pain  . Shortness of Breath    HPI [name] [name] is a 55 y.o. male.  Patient presents with complaint of CP, SOB x 1 week, progressively worsening until today he wanted to seek medical evaluation. He reports relapse of alcoholism earlier this year after 3 years of sobriety. He denies cocaine or other substance abuse. No vomiting, fever, cough. 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 He is a nonsmoker. No aggravating or alleviating factors. He denies the symptoms are exertional. No history of heart problems in the past.    The history is provided by the patient. No language interpreter was used.  Chest Pain   This is a new problem. The current episode started more than 1 week ago. The problem occurs constantly. The problem has been gradually worsening. The pain is present in the substernal region. The pain is mild. Associated symptoms include shortness of breath. Pertinent negatives include no dizziness, no fever, no nausea, no vomiting and no weakness.  Shortness of Breath  Associated symptoms include chest pain. Pertinent negatives include no fever and no vomiting.    Past Medical History:  Diagnosis Date  . Alcohol abuse    Last Drink on Sep 2015  . Anxiety    About 55 years of age  . Carpal tunnel syndrome of right wrist Dx 2015  . Depression    At 55 years of age  . GERD (gastroesophageal reflux disease) Dx 2007  . Hepatitis, alcoholic, acute 123456  . Hyperlipidemia    borderline, diet controlled  . Pancreatitis Dx 2014  . Pneumonia 11/2014  . 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 Reflux Dx 2007  . 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 CAP (community acquired pneumonia) 01/26/2016    . Bilateral knee pain 01/11/2016  . Loss of weight 09/27/2015  . Fatigue 09/27/2015  . Chronic pain of right wrist 07/07/2015  . Carpal tunnel syndrome of right wrist 05/20/2015  . Dislocation of right shoulder joint 05/20/2015  . Tear of medial meniscus of right knee 03/24/2015  . Epidermal cyst 02/22/2015  . 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 Bronchiectasis without acute exacerbation ([place]) 01/27/2015  . Chronic cough 01/15/2015  . Onychomycosis of toenail 12/15/2014  . Alcohol abuse with intoxication ([place]) 11/11/2014  . Seborrhea 11/11/2014  . GERD (gastroesophageal reflux disease)   . Generalized anxiety disorder 09/02/2013    Past Surgical History:  Procedure Laterality Date  . carpel tunnel release Right 07/28/2014    done in [place]   . ELBOW SURGERY    . KNEE ARTHROSCOPY    . KNEE SURGERY Left    15 years ago  . LIPOMA EXCISION N/A 05/10/2016   Procedure: EXCISION OF SCALP LIPOMA;  Surgeon: Johnathan Hausen, MD;  Location: [facility];  Service: General;  Laterality: N/A;       Home Medications    Prior to Admission medications   Medication Sig Start Date End Date Taking? Authorizing Provider  chlordiazePOXIDE (LIBRIUM) 25 MG capsule Take 25-50 mg by mouth See admin instructions. Take 2 caps 3 times a day x1 day. 1-2 caps twice daily x1 day. Then 1-2 once daily x1 day   Yes  Historical Provider, MD  DiphenhydrAMINE HCl, Sleep, (ZZZQUIL) 25 MG CAPS Take 2 capsules by mouth at bedtime.   Yes Historical Provider, MD  escitalopram (LEXAPRO) 10 MG tablet Take 10 mg by mouth daily.   Yes Historical Provider, MD    Family History Family History  Problem Relation Age of Onset  . Diabetes Father   . Hypertension Father   . Hypertension Paternal Grandfather   . Alcohol abuse Paternal Grandfather   . Alcohol abuse Maternal Grandfather   . Alcohol abuse Maternal Grandmother   . Alcohol abuse Paternal Grandmother   . Colon cancer Neg Hx   . Rectal  cancer Neg Hx   . Stomach cancer Neg Hx     Social History Social History  Substance Use Topics  . Smoking status: Former Smoker    Types: Cigars  . Smokeless tobacco: Never Used     Comment: smokes occasional black &mild  . Alcohol use 0.0 oz/week     Comment: last ETOH 06/29/16 had beer     Allergies   Oxycodone   Review of Systems Review of Systems  Constitutional: Negative for chills and fever.  HENT: Negative.   Respiratory: Positive for shortness of breath.   Cardiovascular: Positive for chest pain.  Gastrointestinal: Negative.  Negative for nausea and vomiting.  Musculoskeletal: Negative.   Neurological: Negative.  Negative for dizziness, weakness and light-headedness.     Physical Exam Updated Vital Signs BP 107/89   Pulse 66   Temp 97.9 F (36.6 C) (Oral)   Resp 18   Ht 5\' 9"  (1.753 m)   Wt 59.4 kg   SpO2 99%   BMI 19.35 kg/m   Physical Exam  Constitutional: He appears well-developed and well-nourished.  Patient is acutely intoxicated but coherent.  HENT:  Head: Normocephalic.  Neck: Normal range of motion. Neck supple.  Cardiovascular: Normal rate and regular rhythm.   Pulmonary/Chest: Effort normal and breath sounds normal. He has no wheezes. He has no rales.  Abdominal: Soft. Bowel sounds are normal. There is no tenderness. There is no rebound and no guarding.  Musculoskeletal: Normal range of motion. He exhibits no edema.  Neurological: He is alert. No cranial nerve deficit.  Skin: Skin is warm and dry. No rash noted.  Psychiatric: He has a normal mood and affect.     ED Treatments / Results  Labs (all labs ordered are listed, but only abnormal results are displayed) Labs Reviewed  CBC - Abnormal; Notable for the following:       Result Value   Hemoglobin 12.4 (*)    HCT 38.9 (*)    MCH 25.9 (*)    RDW 16.5 (*)    All other components within normal limits  COMPREHENSIVE METABOLIC PANEL - Abnormal; Notable for the following:     Calcium 8.7 (*)    ALT 16 (*)    All other components within normal limits  URINE RAPID DRUG SCREEN, HOSP PERFORMED - Abnormal; Notable for the following:    Benzodiazepines POSITIVE (*)    All other components within normal limits  ETHANOL - Abnormal; Notable for the following:    Alcohol, Ethyl (B) 328 (*)    All other components within normal limits  I-STAT TROPOININ, ED  I-STAT TROPOININ, ED  I-STAT TROPOININ, ED   Results for orders placed or performed during the hospital encounter of 07/29/16  CBC  Result Value Ref Range   WBC 5.1 4.0 - 10.5 K/uL   RBC 4.79 4.22 -  5.81 MIL/uL   Hemoglobin 12.4 (L) 13.0 - 17.0 g/dL   HCT 38.9 (L) 39.0 - 52.0 %   MCV 81.2 78.0 - 100.0 fL   MCH 25.9 (L) 26.0 - 34.0 pg   MCHC 31.9 30.0 - 36.0 g/dL   RDW 16.5 (H) 11.5 - 15.5 %   Platelets 164 150 - 400 K/uL  Comprehensive metabolic panel  Result Value Ref Range   Sodium 139 135 - 145 mmol/L   Potassium 3.8 3.5 - 5.1 mmol/L   Chloride 103 101 - 111 mmol/L   CO2 31 22 - 32 mmol/L   Glucose, Bld 84 65 - 99 mg/dL   BUN 8 6 - 20 mg/dL   Creatinine, Ser 1.07 0.61 - 1.24 mg/dL   Calcium 8.7 (L) 8.9 - 10.3 mg/dL   Total Protein 7.6 6.5 - 8.1 g/dL   Albumin 4.0 3.5 - 5.0 g/dL   AST 36 15 - 41 U/L   ALT 16 (L) 17 - 63 U/L   Alkaline Phosphatase 41 38 - 126 U/L   Total Bilirubin 0.4 0.3 - 1.2 mg/dL   GFR calc non Af Amer >60 >60 mL/min   GFR calc Af Amer >60 >60 mL/min   Anion gap 5 5 - 15  Rapid urine drug screen (hospital performed)  Result Value Ref Range   Opiates NONE DETECTED NONE DETECTED   Cocaine NONE DETECTED NONE DETECTED   Benzodiazepines POSITIVE (A) NONE DETECTED   Amphetamines NONE DETECTED NONE DETECTED   Tetrahydrocannabinol NONE DETECTED NONE DETECTED   Barbiturates NONE DETECTED NONE DETECTED  Ethanol  Result Value Ref Range   Alcohol, Ethyl (B) 328 (HH) <5 mg/dL  I-Stat Troponin, ED (not at [facility])  Result Value Ref Range   Troponin i, poc 0.00 0.00 - 0.08 ng/mL    Comment 3          I-stat troponin, ED  Result Value Ref Range   Troponin i, poc 0.00 0.00 - 0.08 ng/mL   Comment 3             EKG  EKG Interpretation  Date/Time:  Sunday July 30 2016 00:22:36 EDT Ventricular Rate:  65 PR Interval:    QRS Duration: 71 QT Interval:  396 QTC Calculation: 412 R Axis:   72 Text Interpretation:  Sinus rhythm Consider left ventricular hypertrophy ST-t wave abnormality Left ventricular hypertrophy Artifact Abnormal ekg Confirmed by Carmin Muskrat  MD [phone]) on 07/30/2016 12:30:04 AM       Radiology Dg Chest 2 View  Result Date: 07/30/2016 CLINICAL DATA:  Chest pain and shortness of breath today. Loss of appetite for 2 days. Previous smoker. History of sarcoidosis peer EXAM: CHEST  2 VIEW COMPARISON:  05/29/2016 FINDINGS: Normal heart size and pulmonary vascularity. Prominent scarring in the lung apices with bilateral hilar retraction and apical pleural thickening. Changes are consistent with history of sarcoidosis. Similar appearance to previous study. No developing airspace disease or consolidation. No pneumothorax. IMPRESSION: Prominent bilateral upper lobe scarring, hilar retraction, and pleural thickening compatible with history of sarcoidosis. No acute changes. Electronically Signed   By: Lucienne Capers M.D.   On: 07/30/2016 00:22    Procedures Procedures (including critical care time)  Medications Ordered in ED Medications - No data to display   Initial Impression / Assessment and Plan / ED Course  I have reviewed the triage vital signs and the nursing notes.  Pertinent labs & imaging results that were available during my care of the patient  were reviewed by me and considered in my medical decision making (see chart for details).  Clinical Course    Patient appears comfortable throughout duration of ED visit. No complaints. EKG abnormal but unchanged from previous. Troponins, including delta trop, both 0.00. Labs otherwise  unremarkable with the exception of alcohol of 328.   The patient can be discharged home and is encouraged to see PCP this week for recheck.   Final Clinical Impressions(s) / ED Diagnoses   Final diagnoses:  None   1. Chest pain 2. SOB  New Prescriptions New Prescriptions   No medications on file     Charlann Lange, Hershal Coria 07/30/16 [place], MD 08/03/16 1131

## 2016-07-30 NOTE — ED Notes (Signed)
Patient in Xray

## 2016-07-30 NOTE — ED Notes (Signed)
Patient returned from xray.

## 2016-07-30 NOTE — ED Notes (Signed)
Pt pending another troponin at 0300

## 2016-07-31 ENCOUNTER — Encounter: Payer: Self-pay | Admitting: Family Medicine

## 2016-07-31 NOTE — Assessment & Plan Note (Addendum)
Alcohol abuse patient desires to quit, there is depressed and anxious mood as well. He is medically stable, I can find not acute need for transfer to ED   Plan: Serax taper Advised AA Wrote letter to excuse patient from work  Will ask case managers to help look into short term rehab facilities

## 2016-08-24 ENCOUNTER — Emergency Department (HOSPITAL_COMMUNITY): Payer: Commercial Managed Care - HMO

## 2016-08-24 ENCOUNTER — Emergency Department (HOSPITAL_COMMUNITY)
Admission: EM | Admit: 2016-08-24 | Discharge: 2016-08-24 | Disposition: A | Discharge status: Home / Self Care | Payer: Commercial Managed Care - HMO | Attending: Emergency Medicine | Admitting: Emergency Medicine

## 2016-08-24 ENCOUNTER — Ambulatory Visit: Payer: Commercial Managed Care - HMO | Attending: Family Medicine | Admitting: Family Medicine

## 2016-08-24 ENCOUNTER — Encounter (HOSPITAL_COMMUNITY): Payer: Self-pay | Admitting: Emergency Medicine

## 2016-08-24 ENCOUNTER — Encounter: Payer: Self-pay | Admitting: Family Medicine

## 2016-08-24 VITALS — BP 119/73 | HR 116 | Temp 98.6°F | Ht 69.0 in | Wt 128.8 lb

## 2016-08-24 DIAGNOSIS — Z590 Homelessness: Secondary | ICD-10-CM | POA: Diagnosis not present

## 2016-08-24 DIAGNOSIS — R079 Chest pain, unspecified: Secondary | ICD-10-CM | POA: Diagnosis not present

## 2016-08-24 DIAGNOSIS — Z87891 Personal history of nicotine dependence: Secondary | ICD-10-CM | POA: Insufficient documentation

## 2016-08-24 DIAGNOSIS — F10129 Alcohol abuse with intoxication, unspecified: Secondary | ICD-10-CM | POA: Diagnosis not present

## 2016-08-24 DIAGNOSIS — D869 Sarcoidosis, unspecified: Secondary | ICD-10-CM | POA: Diagnosis not present

## 2016-08-24 DIAGNOSIS — Z5321 Procedure and treatment not carried out due to patient leaving prior to being seen by health care provider: Secondary | ICD-10-CM | POA: Diagnosis not present

## 2016-08-24 LAB — BASIC METABOLIC PANEL
Anion gap: 13 (ref 5–15)
BUN: 6 mg/dL (ref 6–20)
CO2: 23 mmol/L (ref 22–32)
Calcium: 9.3 mg/dL (ref 8.9–10.3)
Chloride: 101 mmol/L (ref 101–111)
Creatinine, Ser: 0.9 mg/dL (ref 0.61–1.24)
GFR calc Af Amer: >60 mL/min (ref >60)
GFR calc non Af Amer: >60 mL/min (ref >60)
Glucose, Bld: 181 mg/dL — ABNORMAL HIGH (ref 65–99)
Potassium: 4 mmol/L (ref 3.5–5.1)
Sodium: 137 mmol/L (ref 135–145)

## 2016-08-24 LAB — CBC
HCT: 40.5 % (ref 39.0–52.0)
Hemoglobin: 12.9 g/dL — ABNORMAL LOW (ref 13.0–17.0)
MCH: 26 pg (ref 26.0–34.0)
MCHC: 31.9 g/dL (ref 30.0–36.0)
MCV: 81.7 fL (ref 78.0–100.0)
Platelets: 146 10*3/uL — ABNORMAL LOW (ref 150–400)
RBC: 4.96 MIL/uL (ref 4.22–5.81)
RDW: 16.9 % — ABNORMAL HIGH (ref 11.5–15.5)
WBC: 5.6 10*3/uL (ref 4.0–10.5)

## 2016-08-24 LAB — I-STAT TROPONIN, ED: Troponin i, poc: 0.01 ng/mL (ref 0.00–0.08)

## 2016-08-24 MED ORDER — FOLIC ACID 1 MG PO TABS: 1.0000 mg | ORAL_TABLET | Freq: Every day | ORAL | 5 refills | Status: DC

## 2016-08-24 MED ORDER — THERA VITAL M PO TABS: 1.0000 | ORAL_TABLET | Freq: Every day | ORAL | 5 refills | Status: DC

## 2016-08-24 MED ORDER — PANTOPRAZOLE SODIUM 40 MG PO TBEC: 40.0000 mg | DELAYED_RELEASE_TABLET | Freq: Every day | ORAL | 5 refills | Status: DC

## 2016-08-24 MED ORDER — VITAMIN B-1 100 MG PO TABS: 100.0000 mg | ORAL_TABLET | Freq: Every day | ORAL | 5 refills | Status: DC

## 2016-08-24 NOTE — Progress Notes (Signed)
Subjective:  Patient ID: [name] [name], male    DOB: Aug 19, 1961  Age: 55 y.o. MRN: [id number]  CC: Follow-up (withdrawal issues)   HPI [name] [name] has hx of sarcoidosis, ETOH abuse, R medial meniscus tear s/p repair    1. Alcohol abuse:  He is currently drinking 2-3, 40 oz per day for the past 3 months. He last drank yesterday. He has been homeless for 3 weeks as he lived in a sober living home and condition of residency was sobriety. He is taking librium prescribed by me to help him taper from alcohol,  but he is also drinking. He has a pill bottle of serax I prescribed in the past, but has not started.  He reports that he has not eaten in 2 days. He reports being weak, tried, overwhelmed, anxious. He is ready to pursue in patient rehab. He would like to start as soon as possible.   He denies abdominal pain, nausea, emesis and fever.   Past Surgical History:  Procedure Laterality Date  . carpel tunnel release Right 07/28/2014    done in [place]   . ELBOW SURGERY    . KNEE ARTHROSCOPY    . KNEE SURGERY Left    15 years ago  . LIPOMA EXCISION N/A 05/10/2016   Procedure: EXCISION OF SCALP LIPOMA;  Surgeon: Johnathan Hausen, MD;  Location: [facility];  Service: General;  Laterality: N/A;    Social History  Substance Use Topics  . Smoking status: Former Smoker    Types: Cigars  . Smokeless tobacco: Never Used     Comment: smokes occasional black &mild  . Alcohol use 0.0 oz/week     Comment: 120  oz beer per day     Outpatient Medications Prior to Visit  Medication Sig Dispense Refill  . chlordiazePOXIDE (LIBRIUM) 25 MG capsule Take 25-50 mg by mouth See admin instructions. Take 2 caps 3 times a day x1 day. 1-2 caps twice daily x1 day. Then 1-2 once daily x1 day    . escitalopram (LEXAPRO) 10 MG tablet Take 10 mg by mouth daily.    . DiphenhydrAMINE HCl, Sleep, (ZZZQUIL) 25 MG CAPS Take 2 capsules by mouth at bedtime.     No facility-administered medications prior to  visit.     ROS Review of Systems  Constitutional: Positive for unexpected weight change. Negative for chills, fatigue and fever.  HENT: Negative for rhinorrhea.   Eyes: Negative for visual disturbance.  Respiratory: Negative for cough and shortness of breath.   Cardiovascular: Negative for chest pain, palpitations and leg swelling.  Gastrointestinal: Negative for abdominal pain, blood in stool, constipation, diarrhea, nausea and vomiting.  Endocrine: Negative for polydipsia, polyphagia and polyuria.  Musculoskeletal: Negative for arthralgias, back pain, gait problem, myalgias and neck pain.  Skin: Negative for rash.  Allergic/Immunologic: Negative for immunocompromised state.  Hematological: Negative for adenopathy. Does not bruise/bleed easily.  Psychiatric/Behavioral: Positive for dysphoric mood. Negative for sleep disturbance and suicidal ideas. The patient is nervous/anxious.     Objective:  BP 119/73 (BP Location: Right Arm, Patient Position: Sitting, Cuff Size: Small)   Pulse (!) 116   Temp 98.6 F (37 C) (Oral)   Ht 5\' 9"  (1.753 m)   Wt 128 lb 12.8 oz (58.4 kg)   SpO2 96%   BMI 19.02 kg/m   BP/Weight 08/24/2016 [id number]  Systolic BP 123456 [id number] -  Diastolic BP 73 83 -  Wt. (Lbs) 128.8 - 131  BMI 19.02 - 19.35  Some encounter information is confidential and restricted. Go to Review Flowsheets activity to see all data.   Wt Readings from Last 3 Encounters:  08/24/16 128 lb 12.8 oz (58.4 kg)  07/29/16 131 lb (59.4 kg)  07/28/16 131 lb 9.6 oz (59.7 kg)    Physical Exam  Constitutional: He appears well-developed. No distress.  Thin black male   HENT:  Head: Normocephalic and atraumatic.  Neck: Normal range of motion. Neck supple.  Cardiovascular: Normal rate, regular rhythm, normal heart sounds and intact distal pulses.   Pulmonary/Chest: Effort normal. He has no wheezes.  Abdominal: Soft. Bowel sounds are normal. He exhibits no distension and no mass. There  is no tenderness. There is no rebound and no guarding.  Musculoskeletal: He exhibits no edema.  Neurological: He is alert.  Skin: Skin is warm and dry. No rash noted. No erythema.  Psychiatric: His mood appears anxious. He exhibits a depressed mood.   Assessment & Plan:   There are no diagnoses linked to this encounter. Avyn was seen today for follow-up.  Diagnoses and all orders for this visit:  Alcohol abuse with intoxication ([place]) -     pantoprazole (PROTONIX) 40 MG tablet; Take 1 tablet (40 mg total) by mouth daily. -     thiamine (VITAMIN B-1) 100 MG tablet; Take 1 tablet (100 mg total) by mouth daily. -     folic acid (FOLVITE) 1 MG tablet; Take 1 tablet (1 mg total) by mouth daily. -     Multiple Vitamins-Minerals (MULTIVITAMIN) tablet; Take 1 tablet by mouth daily.   There are no diagnoses linked to this encounter. No orders of the defined types were placed in this encounter.   Follow-up: Return in about 4 weeks (around 09/21/2016) for alcohol abuse .   Boykin Nearing MD

## 2016-08-24 NOTE — Assessment & Plan Note (Addendum)
Alcohol abuse with intoxication Ready for treatment. I called around to two inpatient facilities. Patient cannot afford self pay so the following is the plan:   I do not advise mixing librium or serax with alcohol.   Patient to call to [facility] for in patient treatment- 28 days  Call everyday as new admissions are done daily.  [facility] will [employer], they will have availability as early at this weekend or next week. Estimated cost range $1000-$3500 out of pocket depending on % covered by insurance.   Letter written for patient to give to his employer   I have ordered antacid medication to protect your stomach and vitamins to prevent vitamin deficiency.  Go to ED if you develop severe abdominal pain, vomiting with blood or seizure.  Please call with update on inpatient alcohol treatment  Plan to f/u following release from in patient treatment or sooner if needed

## 2016-08-24 NOTE — ED Triage Notes (Signed)
Pt arrives stating CP off and on since yesterday. Nonradiating. C/o some SOB and feels weak. Denies hx of heart disease. REports relapsed on alcohol recently. Hasn't eating in 2 days.

## 2016-08-24 NOTE — ED Notes (Signed)
Pt left AMA; RN attempted to get him to stay. He refused.

## 2016-08-24 NOTE — Progress Notes (Signed)
Pt wants to talk about a detox program  Chest pain is due to congestion  Flu is complete

## 2016-08-24 NOTE — Patient Instructions (Addendum)
[name] [name] was seen today for follow-up.  Diagnoses and all orders for this visit:  Alcohol abuse with intoxication ([place]) -     pantoprazole (PROTONIX) 40 MG tablet; Take 1 tablet (40 mg total) by mouth daily. -     thiamine (VITAMIN B-1) 100 MG tablet; Take 1 tablet (100 mg total) by mouth daily. -     folic acid (FOLVITE) 1 MG tablet; Take 1 tablet (1 mg total) by mouth daily. -     Multiple Vitamins-Minerals (MULTIVITAMIN) tablet; Take 1 tablet by mouth daily.   I do not advise mixing librium or serax with alcohol.   Please call to [name] for in patient treatment- both 28 days  Call everyday as new admissions are done daily.  [name] [name] will [employer], they will have availability as early at this weekend or next week. Estimated cost range $1000-$3500 out of pocket depending on % covered by insurance.   I have ordered antacid medication to protect your stomach and vitamins to prevent vitamin deficiency.  Go to ED if you develop severe abdominal pain, vomiting with blood or seizure.  Please call with update on inpatient alcohol treatment  F/u with me in 4 weeks for alcohol abuse   Dr. Adrian Blackwater

## 2016-09-05 ENCOUNTER — Telehealth: Payer: Self-pay | Admitting: Family Medicine

## 2016-09-05 MED ORDER — CHLORDIAZEPOXIDE HCL 25 MG PO CAPS: ORAL_CAPSULE | ORAL | 0 refills | Status: DC

## 2016-09-05 NOTE — Telephone Encounter (Signed)
Patient is feels the detox is ineffective and would like to be libirum. Please follow up.

## 2016-09-05 NOTE — Telephone Encounter (Signed)
Patient called in He was not able to get into outpatient alcohol detox. He is now taking serax and reports it is not helping.  He is requesting Rx for librium   libirium Rx placed up front for pick up

## 2016-09-06 ENCOUNTER — Telehealth: Payer: Self-pay

## 2016-09-06 NOTE — Telephone Encounter (Signed)
Pt was called but no VM picked up, pt script for librium is up front in book.

## 2016-09-16 ENCOUNTER — Emergency Department (HOSPITAL_COMMUNITY)
Admission: EM | Admit: 2016-09-16 | Discharge: 2016-09-16 | Disposition: A | Discharge status: Home / Self Care | Payer: Commercial Managed Care - HMO | Attending: Emergency Medicine | Admitting: Emergency Medicine

## 2016-09-16 ENCOUNTER — Emergency Department (HOSPITAL_COMMUNITY): Payer: Commercial Managed Care - HMO

## 2016-09-16 ENCOUNTER — Other Ambulatory Visit: Payer: Self-pay

## 2016-09-16 ENCOUNTER — Encounter (HOSPITAL_COMMUNITY): Payer: Self-pay | Admitting: Emergency Medicine

## 2016-09-16 DIAGNOSIS — R072 Precordial pain: Secondary | ICD-10-CM

## 2016-09-16 DIAGNOSIS — Z87891 Personal history of nicotine dependence: Secondary | ICD-10-CM | POA: Insufficient documentation

## 2016-09-16 DIAGNOSIS — K2900 Acute gastritis without bleeding: Secondary | ICD-10-CM

## 2016-09-16 LAB — LIPASE, BLOOD: Lipase: 89 U/L — ABNORMAL HIGH (ref 11–51)

## 2016-09-16 LAB — CBC
HCT: 38.8 % — ABNORMAL LOW (ref 39.0–52.0)
Hemoglobin: 12.5 g/dL — ABNORMAL LOW (ref 13.0–17.0)
MCH: 25.9 pg — ABNORMAL LOW (ref 26.0–34.0)
MCHC: 32.2 g/dL (ref 30.0–36.0)
MCV: 80.3 fL (ref 78.0–100.0)
Platelets: 176 10*3/uL (ref 150–400)
RBC: 4.83 MIL/uL (ref 4.22–5.81)
RDW: 16.3 % — ABNORMAL HIGH (ref 11.5–15.5)
WBC: 7.7 10*3/uL (ref 4.0–10.5)

## 2016-09-16 LAB — BASIC METABOLIC PANEL
Anion gap: 9 (ref 5–15)
BUN: 13 mg/dL (ref 6–20)
CO2: 26 mmol/L (ref 22–32)
Calcium: 9.3 mg/dL (ref 8.9–10.3)
Chloride: 95 mmol/L — ABNORMAL LOW (ref 101–111)
Creatinine, Ser: 1.01 mg/dL (ref 0.61–1.24)
GFR calc Af Amer: >60 mL/min (ref >60)
GFR calc non Af Amer: >60 mL/min (ref >60)
Glucose, Bld: 114 mg/dL — ABNORMAL HIGH (ref 65–99)
Potassium: 3.5 mmol/L (ref 3.5–5.1)
Sodium: 130 mmol/L — ABNORMAL LOW (ref 135–145)

## 2016-09-16 LAB — I-STAT TROPONIN, ED: Troponin i, poc: 0 ng/mL (ref 0.00–0.08)

## 2016-09-16 MED ORDER — OMEPRAZOLE 20 MG PO CPDR: 20.0000 mg | DELAYED_RELEASE_CAPSULE | Freq: Two times a day (BID) | ORAL | 0 refills | Status: DC

## 2016-09-16 MED ORDER — SUCRALFATE 1 G PO TABS: 1.0000 g | ORAL_TABLET | Freq: Three times a day (TID) | ORAL | 0 refills | Status: DC

## 2016-09-16 MED ORDER — GI COCKTAIL ~~LOC~~
30.0000 mL | Freq: Once | ORAL | Status: AC
  Administered 2016-09-16: 30 mL via ORAL
  Filled 2016-09-16: qty 30

## 2016-09-16 MED ORDER — RANITIDINE HCL 150 MG PO CAPS: 150.0000 mg | ORAL_CAPSULE | Freq: Every day | ORAL | 0 refills | Status: DC

## 2016-09-16 NOTE — ED Notes (Signed)
Lab to add on, [place]

## 2016-09-16 NOTE — ED Notes (Signed)
Pt stable, ambulatory, states understanding of discharge instructions 

## 2016-09-16 NOTE — ED Triage Notes (Addendum)
Pt presents to ED for assessment of substernal CP intermittent this week.  Pt was seen at [facility] on Wednesday and diagnosed with inflammation.  Pt sts he "has the feeling of needing to burp but can't" and sts "i have a lot of belching when it happens".  Pt sts pain started after he ate and drank a lot of water today.   Pt sts he was a recovering alcoholic, had a relapse recently, and went in to a recovery group Tuesday.  Pt sts he was drinking very heavily during the relapse (approx 2 months)

## 2016-09-16 NOTE — ED Provider Notes (Signed)
[facility] DEPT Provider Note   CSN: [id number] Arrival date & time: 09/16/16  1613     History   Chief Complaint Chief Complaint  Patient presents with  . Chest Pain    HPI [name] [name] is a 55 y.o. male.  HPI   55 year old male with history of alcohol abuse, anxiety, depression, GERD, pancreatitis presenting to the ED with complaining of substernal chest pain. Patient has had intermittent substernal chest pain for the past week. He described as feeling the urge to burp but can't. Pain worsening with eating or drinking.He tries to drink fluid hoping to pass it but he did not help. He denies having fever, lightheadedness, dizziness, productive cough, hemoptysis, shortness of breath, diaphoresis, nausea or vomiting, back pain, or rash. He admits to using NSAIDs for his right knee pain on intermittent basis. He also admits to relapsing on his alcohol abuse, last drink was last Tuesday which is 5 days ago. He is currently attending a alcohol relapse program at the [facility].  Patient also states he was seen for the same chest discomfort 3 days ago at [facility] regional ER. States he was diagnosed with having "inflammation" and was discharge after he received a shot, unable to tell me what kind. No prior history of MI or PE. Currently rates his pain as 7 out of 10, has been persistent throughout the day today  Past Medical History:  Diagnosis Date  . Alcohol abuse    Last Drink on Sep 2015  . Anxiety    About 55 years of age  . Carpal tunnel syndrome of right wrist Dx 2015  . Depression    At 55 years of age  . GERD (gastroesophageal reflux disease) Dx 2007  . Hepatitis, alcoholic, acute 123456  . Hyperlipidemia    borderline, diet controlled  . Pancreatitis Dx 2014  . Pneumonia 11/2014  . 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 Reflux Dx 2007  . 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 Vasomotor rhinitis  02/13/2016  . Bilateral knee pain 01/11/2016  . Loss of weight 09/27/2015  . Fatigue 09/27/2015  . Chronic pain of right wrist 07/07/2015  . Carpal tunnel syndrome of right wrist 05/20/2015  . Dislocation of right shoulder joint 05/20/2015  . Tear of medial meniscus of right knee 03/24/2015  . 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 Chronic cough 01/15/2015  . Onychomycosis of toenail 12/15/2014  . Alcohol abuse with intoxication ([place]) 11/11/2014  . Seborrhea 11/11/2014  . GERD (gastroesophageal reflux disease)   . Generalized anxiety disorder 09/02/2013    Past Surgical History:  Procedure Laterality Date  . carpel tunnel release Right 07/28/2014    done in [place]   . ELBOW SURGERY    . KNEE ARTHROSCOPY    . KNEE SURGERY Left    15 years ago  . LIPOMA EXCISION N/A 05/10/2016   Procedure: EXCISION OF SCALP LIPOMA;  Surgeon: Johnathan Hausen, MD;  Location: [place];  Service: General;  Laterality: N/A;       Home Medications    Prior to Admission medications   Medication Sig Start Date End Date Taking? Authorizing Provider  chlordiazePOXIDE (LIBRIUM) 25 MG capsule Take 2 caps 3 times a day x1 day. 1-2 caps twice daily x1 day. Then 1-2 once daily to prevent alcohol withdrawals 09/05/16   Boykin Nearing, MD  DiphenhydrAMINE HCl, Sleep, (ZZZQUIL) 25 MG CAPS Take  2 capsules by mouth at bedtime.    Historical Provider, MD  escitalopram (LEXAPRO) 10 MG tablet Take 10 mg by mouth daily.    Historical Provider, MD  folic acid (FOLVITE) 1 MG tablet Take 1 tablet (1 mg total) by mouth daily. 08/24/16   Boykin Nearing, MD  Multiple Vitamins-Minerals (MULTIVITAMIN) tablet Take 1 tablet by mouth daily. 08/24/16   Josalyn Funches, MD  pantoprazole (PROTONIX) 40 MG tablet Take 1 tablet (40 mg total) by mouth daily. 08/24/16   Josalyn Funches, MD  thiamine (VITAMIN B-1) 100 MG tablet Take 1 tablet (100 mg total) by mouth daily. 08/24/16   Boykin Nearing, MD    Family  History Family History  Problem Relation Age of Onset  . Diabetes Father   . Hypertension Father   . Hypertension Paternal Grandfather   . Alcohol abuse Paternal Grandfather   . Alcohol abuse Maternal Grandfather   . Alcohol abuse Maternal Grandmother   . Alcohol abuse Paternal Grandmother   . Colon cancer Neg Hx   . Rectal cancer Neg Hx   . Stomach cancer Neg Hx     Social History Social History  Substance Use Topics  . Smoking status: Former Smoker    Types: Cigars  . Smokeless tobacco: Never Used     Comment: smokes occasional black &mild  . Alcohol use No     Comment: rehab     Allergies   Oxycodone   Review of Systems Review of Systems  All other systems reviewed and are negative.    Physical Exam Updated Vital Signs BP 158/96   Pulse 60   Temp 98.2 F (36.8 C) (Oral)   Resp 16   SpO2 99%   Physical Exam  Constitutional: He appears well-developed and well-nourished. No distress.  HENT:  Head: Atraumatic.  Mouth/Throat: Oropharynx is clear and moist.  Eyes: Conjunctivae are normal.  Neck: Neck supple. No JVD present.  Cardiovascular: Normal rate and regular rhythm.   Pulmonary/Chest: Effort normal and breath sounds normal. He exhibits tenderness (Mild midsternal discomfort on palpation without overlying skin changes).  Abdominal: Soft. There is tenderness (Tenderness to epigastric without guarding or rebound tenderness.).  Neurological: He is alert.  Skin: No rash noted.  Psychiatric: He has a normal mood and affect.  Nursing note and vitals reviewed.    ED Treatments / Results  Labs (all labs ordered are listed, but only abnormal results are displayed) Labs Reviewed  BASIC METABOLIC PANEL - Abnormal; Notable for the following:       Result Value   Sodium 130 (*)    Chloride 95 (*)    Glucose, Bld 114 (*)    All other components within normal limits  CBC - Abnormal; Notable for the following:    Hemoglobin 12.5 (*)    HCT 38.8 (*)    MCH  25.9 (*)    RDW 16.3 (*)    All other components within normal limits  LIPASE, BLOOD - Abnormal; Notable for the following:    Lipase 89 (*)    All other components within normal limits  I-STAT TROPOININ, ED    EKG  EKG Interpretation None     ED ECG REPORT   Date: 09/16/2016  Rate: 82  Rhythm: normal sinus rhythm  QRS Axis: normal  Intervals: normal  ST/T Wave abnormalities: septal infarct, age undetermined  Conduction Disutrbances:none  Narrative Interpretation:   Old EKG Reviewed: unchanged  I have personally reviewed the EKG tracing and agree with the  computerized printout as noted.   Radiology Dg Chest 2 View  Result Date: 09/16/2016 CLINICAL DATA:  55 year old male with history of chest pain in the mid chest and tightness for 1 week. EXAM: CHEST  2 VIEW COMPARISON:  Multiple priors, most recently 09/14/2016. FINDINGS: Chronically increased interstitial markings with reticulonodular pattern in the upper lungs, very similar to multiple prior examinations. Chronic upper lung volume loss redemonstrated, as evidenced by chronic upward retraction of hilar structures, and displacement of the minor fissure. Marked pleural thickening in the apex of the right hemithorax, similar to prior examinations. No acute consolidative airspace disease. No pleural effusions. No evidence of pulmonary edema. Heart size is normal. Upper mediastinal contours are unchanged compared to prior examinations. IMPRESSION: 1. No acute radiographic abnormality of the chest. 2. Chronic changes of sarcoidosis redemonstrated, as above. Electronically Signed   By: Vinnie Langton M.D.   On: 09/16/2016 16:57    Procedures Procedures (including critical care time)  Medications Ordered in ED Medications  gi cocktail (Maalox,Lidocaine,Donnatal) (30 mLs Oral Given 09/16/16 1932)     Initial Impression / Assessment and Plan / ED Course  I have reviewed the triage vital signs and the nursing  notes.  Pertinent labs & imaging results that were available during my care of the patient were reviewed by me and considered in my medical decision making (see chart for details).  Clinical Course   BP 162/100   Pulse 81   Temp 98.2 F (36.8 C) (Oral)   Resp 15   SpO2 96%    Final Clinical Impressions(s) / ED Diagnoses   Final diagnoses:  Substernal chest pain  Other acute gastritis without hemorrhage    New Prescriptions New Prescriptions   OMEPRAZOLE (PRILOSEC) 20 MG CAPSULE    Take 1 capsule (20 mg total) by mouth 2 (two) times daily before a meal.   RANITIDINE (ZANTAC) 150 MG CAPSULE    Take 1 capsule (150 mg total) by mouth daily.   SUCRALFATE (CARAFATE) 1 G TABLET    Take 1 tablet (1 g total) by mouth 4 (four) times daily -  with meals and at bedtime.   7:36 PM Patient here with substernal chest pain and epigastric abdominal pain which has been ongoing for the past week. Pain is suggestive of gastrointestinal etiology, suspect gastritis or gastric reflux. Continuing factor includes alcohol abuse, and NSAIDs use. Pain is atypical for ACS. EKG, troponin, and labs are reassuring. Mildly elevated lipase of 89, likely secondary to alcohol use. Doubt acute pancreatitis.  Will give GI cocktail and will monitor. Anticipate discharge.  8:12 PM Patient reported improvement of symptoms after receiving GI cocktail. Patient understands to avoid NSAIDs, as well as alcohol and spicy food. Patient can follow-up with GI specialist as needed. Return precaution discussed. Work note provided as requested.   Domenic Moras, PA-C 09/16/16 2014    Gareth Morgan, MD 09/18/16 1355

## 2016-09-16 NOTE — ED Notes (Signed)
Pt to room 37

## 2016-09-28 ENCOUNTER — Encounter: Payer: Self-pay | Admitting: Family Medicine

## 2016-09-28 ENCOUNTER — Ambulatory Visit: Payer: Self-pay | Attending: Family Medicine | Admitting: Family Medicine

## 2016-09-28 ENCOUNTER — Encounter: Payer: Self-pay | Admitting: Licensed Clinical Social Worker

## 2016-09-28 VITALS — BP 107/75 | HR 88 | Temp 98.7°F | Ht 69.0 in | Wt 139.8 lb

## 2016-09-28 DIAGNOSIS — Z87891 Personal history of nicotine dependence: Secondary | ICD-10-CM | POA: Insufficient documentation

## 2016-09-28 DIAGNOSIS — F10129 Alcohol abuse with intoxication, unspecified: Secondary | ICD-10-CM

## 2016-09-28 DIAGNOSIS — Z79899 Other long term (current) drug therapy: Secondary | ICD-10-CM | POA: Insufficient documentation

## 2016-09-28 DIAGNOSIS — Z590 Homelessness: Secondary | ICD-10-CM | POA: Insufficient documentation

## 2016-09-28 DIAGNOSIS — Z9889 Other specified postprocedural states: Secondary | ICD-10-CM | POA: Insufficient documentation

## 2016-09-28 DIAGNOSIS — D869 Sarcoidosis, unspecified: Secondary | ICD-10-CM | POA: Insufficient documentation

## 2016-09-28 NOTE — Patient Instructions (Addendum)
[place] was seen today for chest pain.  Diagnoses and all orders for this visit:  Alcohol abuse with intoxication ([place])   We our working on finding treatment for substance abuse   F/u in 1 months for alcohol abuse    Dr. Adrian Blackwater

## 2016-09-28 NOTE — Progress Notes (Signed)
Subjective:  Patient ID: [name] [name], male    DOB: 1961-03-25  Age: 55 y.o. MRN: [id number]  CC: Alcohol Problem   HPI [name] [name] has hx of sarcoidosis, ETOH abuse, R medial meniscus tear s/p repair    1. Alcohol abuse:  He is currently drinking 2-3, 40 oz per day for the past 4 months. He last drank today. He has been homeless for 3 weeks as he lived in a sober living home and condition of residency was sobriety. He reports that he has not eaten in 2 days. He reports being weak, tried, overwhelmed, anxious. He has pursued in patient rehab without finding placement. He tried to move back in to [employer]. He was placed to work at [employer] He reports that he cannot do that work due to fear of cats and dogs and anxiety about it affecting his latent pulmonary sarcoidosis.    Past Surgical History:  Procedure Laterality Date  . carpel tunnel release Right 07/28/2014    done in [place]   . ELBOW SURGERY    . KNEE ARTHROSCOPY    . KNEE SURGERY Left    15 years ago  . LIPOMA EXCISION N/A 05/10/2016   Procedure: EXCISION OF SCALP LIPOMA;  Surgeon: Johnathan Hausen, MD;  Location: [facility];  Service: General;  Laterality: N/A;    Social History  Substance Use Topics  . Smoking status: Former Smoker    Types: Cigars  . Smokeless tobacco: Never Used     Comment: smokes occasional black &mild  . Alcohol use No     Comment: rehab    Outpatient Medications Prior to Visit  Medication Sig Dispense Refill  . escitalopram (LEXAPRO) 10 MG tablet Take 10 mg by mouth daily.    Marland Kitchen omeprazole (PRILOSEC) 20 MG capsule Take 1 capsule (20 mg total) by mouth 2 (two) times daily before a meal. 30 capsule 0  . ranitidine (ZANTAC) 150 MG capsule Take 1 capsule (150 mg total) by mouth daily. 30 capsule 0  . sucralfate (CARAFATE) 1 g tablet Take 1 tablet (1 g total) by mouth 4 (four) times daily -  with meals and at bedtime. 30 tablet 0  . chlordiazePOXIDE (LIBRIUM) 25 MG  capsule Take 2 caps 3 times a day x1 day. 1-2 caps twice daily x1 day. Then 1-2 once daily to prevent alcohol withdrawals (Patient not taking: Reported on 09/28/2016) 30 capsule 0  . DiphenhydrAMINE HCl, Sleep, (ZZZQUIL) 25 MG CAPS Take 2 capsules by mouth at bedtime.    . folic acid (FOLVITE) 1 MG tablet Take 1 tablet (1 mg total) by mouth daily. (Patient not taking: Reported on 09/28/2016) 30 tablet 5  . Multiple Vitamins-Minerals (MULTIVITAMIN) tablet Take 1 tablet by mouth daily. (Patient not taking: Reported on 09/28/2016) 30 tablet 5  . pantoprazole (PROTONIX) 40 MG tablet Take 1 tablet (40 mg total) by mouth daily. (Patient not taking: Reported on 09/28/2016) 30 tablet 5  . thiamine (VITAMIN B-1) 100 MG tablet Take 1 tablet (100 mg total) by mouth daily. (Patient not taking: Reported on 09/28/2016) 30 tablet 5   No facility-administered medications prior to visit.     ROS Review of Systems  Constitutional: Positive for unexpected weight change. Negative for chills, fatigue and fever.  HENT: Negative for rhinorrhea.   Eyes: Negative for visual disturbance.  Respiratory: Negative for cough and shortness of breath.   Cardiovascular: Negative for chest pain, palpitations and leg swelling.  Gastrointestinal: Negative for abdominal  pain, blood in stool, constipation, diarrhea, nausea and vomiting.  Endocrine: Negative for polydipsia, polyphagia and polyuria.  Musculoskeletal: Negative for arthralgias, back pain, gait problem, myalgias and neck pain.  Skin: Negative for rash.  Allergic/Immunologic: Negative for immunocompromised state.  Hematological: Negative for adenopathy. Does not bruise/bleed easily.  Psychiatric/Behavioral: Positive for dysphoric mood. Negative for sleep disturbance and suicidal ideas. The patient is nervous/anxious.     Objective:  BP 107/75 (BP Location: Left Arm, Patient Position: Sitting, Cuff Size: Small)   Pulse 88   Temp 98.7 F (37.1 C) (Oral)   Ht 5\' 9"   (1.753 m)   Wt 139 lb 12.8 oz (63.4 kg)   SpO2 98%   BMI 20.64 kg/m   BP/Weight 09/28/2016 09/16/2016 [id number]  Systolic BP [id number]  Diastolic BP 75 94 90  Wt. (Lbs) 139.8 - -  BMI 20.64 - -  Some encounter information is confidential and restricted. Go to Review Flowsheets activity to see all data.   Wt Readings from Last 3 Encounters:  09/28/16 139 lb 12.8 oz (63.4 kg)  08/24/16 128 lb 12.8 oz (58.4 kg)  07/29/16 131 lb (59.4 kg)    Physical Exam  Constitutional: He appears well-developed. No distress.  Thin black male   HENT:  Head: Normocephalic and atraumatic.  Neck: Normal range of motion. Neck supple.  Cardiovascular: Normal rate, regular rhythm, normal heart sounds and intact distal pulses.   Pulmonary/Chest: Effort normal. He has no wheezes.  Abdominal: Soft. Bowel sounds are normal. He exhibits no distension and no mass. There is no tenderness. There is no rebound and no guarding.  Musculoskeletal: He exhibits no edema.  Neurological: He is alert.  Skin: Skin is warm and dry. No rash noted. No erythema.  Psychiatric: His mood appears anxious. He exhibits a depressed mood.   Assessment & Plan:   There are no diagnoses linked to this encounter. Kevontae was seen today for alcohol problem.  Diagnoses and all orders for this visit:  Alcohol abuse with intoxication ([place])   There are no diagnoses linked to this encounter. No orders of the defined types were placed in this encounter.   Follow-up: Return in about 4 weeks (around 10/26/2016) for alcohol abuse .   Boykin Nearing MD

## 2016-09-28 NOTE — Assessment & Plan Note (Signed)
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

## 2016-09-28 NOTE — Progress Notes (Signed)
Pt is here today for chest pain, pt has had pains for 2 months.

## 2016-09-29 NOTE — BH Specialist Note (Signed)
Session Start time: 4:20 pm   End Time: 4:50 pm Total Time:  30 minutes Type of Service: [place]: No.   Interpreter Name & Language: N/A # [facility] Visits July 2017-June 2018: 1st   SUBJECTIVE: [name] [name] is a 55 y.o. male  Pt. was referred by Dr. Adrian Blackwater for:  anxiety and depression. Pt. reports the following symptoms/concerns: panic attacks, overwhelming feelings of sadness, recent relapse (alcohol) Duration of problem:  "since monday" Severity: moderate Previous treatment: Pt reported that he has attempted to obtain services through [facility] Not receiving services at present time.   OBJECTIVE: Mood: Anxious and Irritable & Affect: Depressed Risk of harm to self or others: Pt denies SI/HI Assessments administered: PHQ-9; GAD-7  LIFE CONTEXT:  Family & Social: Pt has no support School/ Work: Pt is unemployed. Receives food stamps Self-Care: Pt recently relapsed (alcohol) reports drinking "whenever I can get a beer" Pt experiencing food insecurity, since being homeless Life changes: Pt was recently was asked to leave sober living home [facility]) due to inability to work at [employer]. Pt has relapsed and began drinking alcohol to cope with stressors. What is important to pt/family (values): Independence, Good Health   GOALS ADDRESSED:  Decrease symptoms of anxiety Decrease symptoms of depression Obtain community resources for housing  INTERVENTIONS: Solution Focused, Strength-based and Supportive   ASSESSMENT:  Pt currently experiencing depression and anxiety triggered by recent homelessness and relapse on alcohol. Pt reports panic attacks and overwhelming feelings of sadness. Pt has no identified support. Pt may benefit from psychotherapy and medication management. LCSWA provided pt with community resources on emergency housing, community agencies that provide free meals, and an updated AA schedule. Pt was provided crisis intervention  resources. LCSWA explained how to initiate services with [facility]. Pt verbalized understanding.      PLAN: 1. F/U with behavioral health clinician: Pt was encouraged to contact LCSWA if symptoms worsen or fail to improve to schedule behavioral appointments at [facility]. 2. Behavioral Health meds: Lexapro 3. Behavioral recommendations: Pt is encouraged to initiate services with [facility] and/or schedule follow up appointment with [facility] 4. Referral: Brief Counseling/Psychotherapy, [employer], Psychoeducation and Supportive Counseling 5. From scale of 1-10, how likely are you to follow plan: 7/10   Rebekah Chesterfield, MSW, [place] Worker 09/29/16 11:20 am  Warmhandoff:   Warm Hand Off Completed.

## 2016-11-09 ENCOUNTER — Ambulatory Visit (HOSPITAL_COMMUNITY)
Admission: RE | Admit: 2016-11-09 | Discharge: 2016-11-09 | Disposition: A | Discharge status: Home / Self Care | Payer: Self-pay | Source: Ambulatory Visit | Attending: Family Medicine | Admitting: Family Medicine

## 2016-11-09 ENCOUNTER — Ambulatory Visit: Payer: Self-pay | Attending: Family Medicine | Admitting: Family Medicine

## 2016-11-09 ENCOUNTER — Encounter: Payer: Self-pay | Admitting: Family Medicine

## 2016-11-09 VITALS — BP 137/84 | HR 80 | Temp 98.3°F | Ht 69.0 in | Wt 144.8 lb

## 2016-11-09 DIAGNOSIS — Z87891 Personal history of nicotine dependence: Secondary | ICD-10-CM | POA: Insufficient documentation

## 2016-11-09 DIAGNOSIS — R05 Cough: Secondary | ICD-10-CM

## 2016-11-09 DIAGNOSIS — R053 Chronic cough: Secondary | ICD-10-CM

## 2016-11-09 DIAGNOSIS — R0602 Shortness of breath: Secondary | ICD-10-CM | POA: Insufficient documentation

## 2016-11-09 DIAGNOSIS — Z79899 Other long term (current) drug therapy: Secondary | ICD-10-CM | POA: Insufficient documentation

## 2016-11-09 DIAGNOSIS — D860 Sarcoidosis of lung: Secondary | ICD-10-CM | POA: Insufficient documentation

## 2016-11-09 DIAGNOSIS — F411 Generalized anxiety disorder: Secondary | ICD-10-CM | POA: Insufficient documentation

## 2016-11-09 MED ORDER — ESCITALOPRAM OXALATE 20 MG PO TABS: 20.0000 mg | ORAL_TABLET | Freq: Every day | ORAL | 5 refills | Status: DC

## 2016-11-09 MED ORDER — ALBUTEROL SULFATE HFA 108 (90 BASE) MCG/ACT IN AERS: 2.0000 | INHALATION_SPRAY | Freq: Four times a day (QID) | RESPIRATORY_TRACT | 5 refills | Status: DC | PRN

## 2016-11-09 MED ORDER — OMEPRAZOLE 20 MG PO CPDR: 20.0000 mg | DELAYED_RELEASE_CAPSULE | Freq: Two times a day (BID) | ORAL | 5 refills | Status: DC

## 2016-11-09 MED ORDER — FLUTICASONE PROPIONATE 50 MCG/ACT NA SUSP: 2.0000 | Freq: Every day | NASAL | 5 refills | Status: DC

## 2016-11-09 MED ORDER — FEXOFENADINE HCL 180 MG PO TABS: 180.0000 mg | ORAL_TABLET | Freq: Every day | ORAL | 5 refills | Status: DC

## 2016-11-09 NOTE — Progress Notes (Signed)
Subjective:  Patient ID: [name] [name], male    DOB: 1961-11-22  Age: 55 y.o. MRN: [id number]  CC: Shortness of Breath   HPI Baldo Gershon has pulmonary sarcoidosis (non active), alcoholism, chronic anxiety he he presents for    1. SOB: chronic. Worsening per report. 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 He works at [employer] as apart of his sober living treatment at [facility].   2. Anxiety: a bit worsened. Request increase of lexapro. Reports his last alcoholic drink was 2 months ago. He is gaining weight and eating well.   Social History  Substance Use Topics  . Smoking status: Former Smoker    Types: Cigars  . Smokeless tobacco: Never Used     Comment: smokes occasional black &mild  . Alcohol use No     Comment: rehab    Outpatient Medications Prior to Visit  Medication Sig Dispense Refill  . escitalopram (LEXAPRO) 10 MG tablet Take 10 mg by mouth daily.    Marland Kitchen omeprazole (PRILOSEC) 20 MG capsule Take 1 capsule (20 mg total) by mouth 2 (two) times daily before a meal. 30 capsule 0  . DiphenhydrAMINE HCl, Sleep, (ZZZQUIL) 25 MG CAPS Take 2 capsules by mouth at bedtime.    . folic acid (FOLVITE) 1 MG tablet Take 1 tablet (1 mg total) by mouth daily. (Patient not taking: Reported on 11/09/2016) 30 tablet 5  . Multiple Vitamins-Minerals (MULTIVITAMIN) tablet Take 1 tablet by mouth daily. (Patient not taking: Reported on 11/09/2016) 30 tablet 5  . pantoprazole (PROTONIX) 40 MG tablet Take 1 tablet (40 mg total) by mouth daily. (Patient not taking: Reported on 11/09/2016) 30 tablet 5  . ranitidine (ZANTAC) 150 MG capsule Take 1 capsule (150 mg total) by mouth daily. (Patient not taking: Reported on 11/09/2016) 30 capsule 0  . sucralfate (CARAFATE) 1 g tablet Take 1 tablet (1 g total) by mouth 4 (four) times daily -  with meals and at bedtime. (Patient not taking: Reported on 11/09/2016) 30 tablet 0  . thiamine (VITAMIN B-1) 100 MG tablet Take 1 tablet (100 mg  total) by mouth daily. (Patient not taking: Reported on 11/09/2016) 30 tablet 5   No facility-administered medications prior to visit.     ROS Review of Systems  Constitutional: Negative for chills, fatigue, fever and unexpected weight change.  HENT: Positive for tinnitus. Negative for rhinorrhea.   Eyes: Negative for visual disturbance.  Respiratory: Positive for cough and shortness of breath.   Cardiovascular: Negative for chest pain, palpitations and leg swelling.  Gastrointestinal: Negative for abdominal pain, blood in stool, constipation, diarrhea, nausea and vomiting.  Endocrine: Negative for polydipsia, polyphagia and polyuria.  Musculoskeletal: Negative for arthralgias, back pain, gait problem, myalgias and neck pain.  Skin: Negative for rash.  Allergic/Immunologic: Negative for immunocompromised state.  Hematological: Negative for adenopathy. Does not bruise/bleed easily.  Psychiatric/Behavioral: Positive for dysphoric mood. Negative for sleep disturbance and suicidal ideas. The patient is nervous/anxious.     Objective:  BP 137/84 (BP Location: Left Arm, Patient Position: Sitting, Cuff Size: Small)   Pulse 80   Temp 98.3 F (36.8 C) (Oral)   Ht 5\' 9"  (1.753 m)   Wt 144 lb 12.8 oz (65.7 kg)   SpO2 99%   BMI 21.38 kg/m   BP/Weight 11/09/2016 09/28/2016 [id number]  Systolic BP 123456 [id number]  Diastolic BP 84 75 94  Wt. (Lbs) 144.8 139.8 -  BMI 21.38 20.64 -  Some encounter information is  confidential and restricted. Go to Review Flowsheets activity to see all data.   Wt Readings from Last 3 Encounters:  11/09/16 144 lb 12.8 oz (65.7 kg)  09/28/16 139 lb 12.8 oz (63.4 kg)  08/24/16 128 lb 12.8 oz (58.4 kg)    Physical Exam  Constitutional: He appears well-developed and well-nourished. No distress.  HENT:  Head: Normocephalic and atraumatic.  Right Ear: Tympanic membrane, external ear and ear canal normal.  Left Ear: Tympanic membrane, external ear and ear canal  normal.  Neck: Normal range of motion. Neck supple.  Cardiovascular: Normal rate, regular rhythm, normal heart sounds and intact distal pulses.   Pulmonary/Chest: Effort normal and breath sounds normal.  Musculoskeletal: He exhibits no edema.  Neurological: He is alert.  Skin: Skin is warm and dry. No rash noted. No erythema.  Psychiatric: He has a normal mood and affect.    Depression screen [facility] 2/9 11/09/2016 08/24/2016 07/28/2016 07/28/2016 05/02/2016  Decreased Interest 1 1 1 3 1   Down, Depressed, Hopeless 0 1 2 3  0  PHQ - 2 Score 1 2 3 6 1   Altered sleeping 0 1 3 - 1  Tired, decreased energy 3 1 3  - 1  Change in appetite 0 1 3 - 2  Feeling bad or failure about yourself  1 1 3  - 1  Trouble concentrating 0 1 2 - 0  Moving slowly or fidgety/restless 1 2 0 - 0  Suicidal thoughts 0 3 0 - 0  PHQ-9 Score 6 12 17  - 6  Some recent data might be hidden   GAD 7 : Generalized Anxiety Score 11/09/2016 08/24/2016 07/28/2016 05/02/2016  Nervous, Anxious, on Edge 1 1 3 2   Control/stop worrying 1 1 3  0  Worry too much - different things 2 1 3  0  Trouble relaxing 1 1 3  0  Restless 2 1 3  0  Easily annoyed or irritable 2 1 2  0  Afraid - awful might happen 1 1 3  0  Total GAD 7 Score 10 7 20 2       Assessment & Plan:  Montero was seen today for shortness of breath.  Diagnoses and all orders for this visit:  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[place]) -     Pulmonary function test; Future -     DG Chest 2 View; Future  Generalized anxiety disorder -     Discontinue: escitalopram (LEXAPRO) 20 MG tablet; Take 1 tablet (20 mg total) by mouth daily. -     escitalopram (LEXAPRO) 20 MG tablet; Take 1 tablet (20 mg total) by mouth daily.  Chronic cough -     omeprazole (PRILOSEC) 20 MG capsule; Take 1 capsule (20 mg total) by mouth 2 (two) times daily before a meal. -     albuterol (PROVENTIL HFA;VENTOLIN HFA) 108 (90 Base) MCG/ACT inhaler; Inhale 2 puffs into the lungs every 6 (six) hours as needed for wheezing or  shortness of breath. -     DG Chest 2 View; Future -     fluticasone (FLONASE) 50 MCG/ACT nasal spray; Place 2 sprays into both nostrils daily. -     fexofenadine (ALLEGRA ALLERGY) 180 MG tablet; Take 1 tablet (180 mg total) by mouth daily.   There are no diagnoses linked to this encounter.  No orders of the defined types were placed in this encounter.   Follow-up: Return in about 4 weeks (around 12/07/2016) for ringing in ear .   Boykin Nearing MD

## 2016-11-09 NOTE — Patient Instructions (Addendum)
[name] [name] was seen today for shortness of breath.  Diagnoses and all orders for this visit:  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[place]) -     Pulmonary function test; Future -     DG Chest 2 View; Future  Generalized anxiety disorder -     Discontinue: escitalopram (LEXAPRO) 20 MG tablet; Take 1 tablet (20 mg total) by mouth daily. -     escitalopram (LEXAPRO) 20 MG tablet; Take 1 tablet (20 mg total) by mouth daily.  Chronic cough -     omeprazole (PRILOSEC) 20 MG capsule; Take 1 capsule (20 mg total) by mouth 2 (two) times daily before a meal. -     albuterol (PROVENTIL HFA;VENTOLIN HFA) 108 (90 Base) MCG/ACT inhaler; Inhale 2 puffs into the lungs every 6 (six) hours as needed for wheezing or shortness of breath. -     DG Chest 2 View; Future -     fluticasone (FLONASE) 50 MCG/ACT nasal spray; Place 2 sprays into both nostrils daily. -     fexofenadine (ALLEGRA ALLERGY) 180 MG tablet; Take 1 tablet (180 mg total) by mouth daily.  reduce salt intake Congratulations on sobriety   F/u in 4 weeks for BP check and follow up ringing in R ear   Dr. Adrian Blackwater

## 2016-11-09 NOTE — Progress Notes (Signed)
Pt is here today for SOB, and fatigue. Pt is having ringing in his right ear.

## 2016-11-10 NOTE — Assessment & Plan Note (Signed)
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

## 2016-11-10 NOTE — Assessment & Plan Note (Signed)
Chronic Improved with sobriety Increase lexapro to 20 mg daily

## 2016-11-14 ENCOUNTER — Telehealth: Payer: Self-pay | Admitting: Family Medicine

## 2016-11-14 NOTE — Telephone Encounter (Signed)
Patient called the office to speak with nurse regarding his xrays results. Please follow up.  Thank you.

## 2016-11-15 NOTE — Telephone Encounter (Signed)
Pt was called to go over xray results, but pt was not avaiable at the phone number on file. If pt returns call please inform him  That :   No pneumonia or bronchitis on CXR

## 2016-11-24 ENCOUNTER — Ambulatory Visit (HOSPITAL_COMMUNITY)
Admission: RE | Admit: 2016-11-24 | Discharge: 2016-11-24 | Disposition: A | Discharge status: Home / Self Care | Payer: Self-pay | Source: Ambulatory Visit | Attending: Family Medicine | Admitting: Family Medicine

## 2016-11-24 DIAGNOSIS — D860 Sarcoidosis of lung: Secondary | ICD-10-CM | POA: Insufficient documentation

## 2016-11-24 LAB — PULMONARY FUNCTION TEST
DL/VA % pred: 88 %
DL/VA: 4.05 ml/min/mmHg/L
DLCO unc % pred: 66 %
DLCO unc: 20.49 ml/min/mmHg
FEF 25-75 Post: 2.4 L/sec
FEF 25-75 Pre: 1.78 L/sec
FEF2575-%Change-Post: 34 %
FEF2575-%Pred-Post: 79 %
FEF2575-%Pred-Pre: 58 %
FEV1-%Change-Post: 4 %
FEV1-%Pred-Post: 92 %
FEV1-%Pred-Pre: 88 %
FEV1-Post: 2.9 L
FEV1-Pre: 2.77 L
FEV1FVC-%Change-Post: 6 %
FEV1FVC-%Pred-Pre: 89 %
FEV6-%Change-Post: 0 %
FEV6-%Pred-Post: 100 %
FEV6-%Pred-Pre: 99 %
FEV6-Post: 3.84 L
FEV6-Pre: 3.81 L
FEV6FVC-%Change-Post: 1 %
FEV6FVC-%Pred-Post: 103 %
FEV6FVC-%Pred-Pre: 101 %
FVC-%Change-Post: -1 %
FVC-%Pred-Post: 96 %
FVC-%Pred-Pre: 98 %
FVC-Post: 3.84 L
FVC-Pre: 3.9 L
Post FEV1/FVC ratio: 75 %
Post FEV6/FVC ratio: 100 %
Pre FEV1/FVC ratio: 71 %
Pre FEV6/FVC Ratio: 99 %
RV % pred: 66 %
RV: 1.39 L
TLC % pred: 80 %
TLC: 5.44 L

## 2016-11-24 MED ORDER — ALBUTEROL SULFATE (2.5 MG/3ML) 0.083% IN NEBU
2.5000 mg | INHALATION_SOLUTION | Freq: Once | RESPIRATORY_TRACT | Status: AC
  Administered 2016-11-24: 2.5 mg via RESPIRATORY_TRACT

## 2016-12-04 ENCOUNTER — Telehealth: Payer: Self-pay | Admitting: Family Medicine

## 2016-12-04 NOTE — Telephone Encounter (Signed)
Patient can also be contacted at [phone].

## 2016-12-04 NOTE — Telephone Encounter (Signed)
Patient called the office to speak with nurse regarding the results of the test that he took last Friday. Please follow up.  Thank you.

## 2016-12-06 ENCOUNTER — Telehealth: Payer: Self-pay

## 2016-12-06 ENCOUNTER — Telehealth: Payer: Self-pay | Admitting: Family Medicine

## 2016-12-06 NOTE — Telephone Encounter (Signed)
Pt was called 12/07/15

## 2016-12-06 NOTE — Telephone Encounter (Signed)
Pt was called and informed of PFT results.

## 2016-12-06 NOTE — Telephone Encounter (Signed)
Patient called requesting results of tests taken last week. Please f/u.

## 2016-12-14 ENCOUNTER — Ambulatory Visit: Payer: Self-pay

## 2017-01-04 ENCOUNTER — Ambulatory Visit: Payer: Self-pay | Attending: Family Medicine

## 2017-01-04 ENCOUNTER — Ambulatory Visit (HOSPITAL_BASED_OUTPATIENT_CLINIC_OR_DEPARTMENT_OTHER): Payer: Self-pay | Admitting: Family Medicine

## 2017-01-04 ENCOUNTER — Encounter: Payer: Self-pay | Admitting: Family Medicine

## 2017-01-04 VITALS — BP 152/97 | HR 62 | Temp 98.1°F | Ht 69.0 in | Wt 150.2 lb

## 2017-01-04 DIAGNOSIS — G8929 Other chronic pain: Secondary | ICD-10-CM

## 2017-01-04 DIAGNOSIS — S66811A Strain of other specified muscles, fascia and tendons at wrist and hand level, right hand, initial encounter: Secondary | ICD-10-CM

## 2017-01-04 DIAGNOSIS — X58XXXA Exposure to other specified factors, initial encounter: Secondary | ICD-10-CM | POA: Insufficient documentation

## 2017-01-04 DIAGNOSIS — F419 Anxiety disorder, unspecified: Secondary | ICD-10-CM | POA: Insufficient documentation

## 2017-01-04 DIAGNOSIS — M25561 Pain in right knee: Secondary | ICD-10-CM

## 2017-01-04 DIAGNOSIS — Z87891 Personal history of nicotine dependence: Secondary | ICD-10-CM | POA: Insufficient documentation

## 2017-01-04 DIAGNOSIS — S66911A Strain of unspecified muscle, fascia and tendon at wrist and hand level, right hand, initial encounter: Secondary | ICD-10-CM | POA: Insufficient documentation

## 2017-01-04 DIAGNOSIS — F411 Generalized anxiety disorder: Secondary | ICD-10-CM | POA: Insufficient documentation

## 2017-01-04 DIAGNOSIS — Z79899 Other long term (current) drug therapy: Secondary | ICD-10-CM | POA: Insufficient documentation

## 2017-01-04 DIAGNOSIS — M79601 Pain in right arm: Secondary | ICD-10-CM | POA: Insufficient documentation

## 2017-01-04 DIAGNOSIS — M25562 Pain in left knee: Secondary | ICD-10-CM | POA: Insufficient documentation

## 2017-01-04 DIAGNOSIS — M79641 Pain in right hand: Secondary | ICD-10-CM | POA: Insufficient documentation

## 2017-01-04 DIAGNOSIS — M7711 Lateral epicondylitis, right elbow: Secondary | ICD-10-CM | POA: Insufficient documentation

## 2017-01-04 LAB — TSH: TSH: 0.89 mIU/L (ref 0.40–4.50)

## 2017-01-04 MED ORDER — GLUCOSAMINE-CHONDROITIN 750-600 MG PO TABS: 2.0000 | ORAL_TABLET | Freq: Every day | ORAL | 0 refills | Status: DC

## 2017-01-04 MED ORDER — NAPROXEN 500 MG PO TABS: 500.0000 mg | ORAL_TABLET | Freq: Two times a day (BID) | ORAL | 0 refills | Status: DC

## 2017-01-04 MED ORDER — TURMERIC 500 MG PO CAPS: 500.0000 mg | ORAL_CAPSULE | Freq: Every day | ORAL | Status: DC

## 2017-01-04 NOTE — Progress Notes (Signed)
Pt is here today for pain in right arm.(elbow and shoulder). Pt would like to have his thyroid levels checked. Pt right hand is swollen.

## 2017-01-04 NOTE — Patient Instructions (Signed)
[name] [name] was seen today for anxiety.  Diagnoses and all orders for this visit:  Generalized anxiety disorder -     TSH  Chronic pain of both knees  Lateral epicondylitis of right elbow -     naproxen (NAPROSYN) 500 MG tablet; Take 1 tablet (500 mg total) by mouth 2 (two) times daily with a meal.  Strain, MCP, hand, right, initial encounter -     naproxen (NAPROSYN) 500 MG tablet; Take 1 tablet (500 mg total) by mouth 2 (two) times daily with a meal.  Other orders -     Turmeric 500 MG CAPS; Take 500 mg by mouth daily. -     Glucosamine-Chondroitin 750-600 MG TABS; Take 2 tablets by mouth daily.   Ice R lateral elbow R hand for 20 minutes twice daily Take naproxen with food  Rest for next 3 days to help speed healing time   F/u in 6 weeks sooner if needed   Dr. Adrian Blackwater   Tennis Elbow Tennis elbow is puffiness (inflammation) of the outer tendons of your forearm close to your elbow. Your tendons attach your muscles to your bones. Tennis elbow can happen in any sport or job in which you use your elbow too much. It is caused by doing the same motion over and over. Tennis elbow can cause:  Pain and tenderness in your forearm and the outer part of your elbow.  A burning feeling. This runs from your elbow through your arm.  Weak grip in your hands. Follow these instructions at home: Activity  Rest your elbow and wrist as told by your doctor. Try to avoid any activities that caused the problem until your doctor says that you can do them again.  If a physical therapist teaches you exercises, do all of them as told.  If you lift an object, lift it with your palm facing up. This is easier on your elbow. Lifestyle  If your tennis elbow is caused by sports, check your equipment and make sure that:  You are using it correctly.  It fits you well.  If your tennis elbow is caused by work, take breaks often, if you are able. Talk with your manager about doing your work in a way that is  safe for you.  If your tennis elbow is caused by computer use, talk with your manager about any changes that can be made to your work setup. General instructions  If told, apply ice to the painful area:  Put ice in a plastic bag.  Place a towel between your skin and the bag.  Leave the ice on for 20 minutes, 2-3 times per day.  Take medicines only as told by your doctor.  If you were given a brace, wear it as told by your doctor.  Keep all follow-up visits as told by your doctor. This is important. Contact a doctor if:  Your pain does not get better with treatment.  Your pain gets worse.  You have weakness in your forearm, hand, or fingers.  You cannot feel your forearm, hand, or fingers. This information is not intended to replace advice given to you by your health care provider. Make sure you discuss any questions you have with your health care provider. Document Released: 05/03/2010 Document Revised: 07/13/2016 Document Reviewed: 11/09/2014 Elsevier Interactive Patient Education  2017 [employer].

## 2017-01-04 NOTE — Progress Notes (Signed)
Subjective:  Patient ID: [name] [name], male    DOB: [birth year]  Age: 56 y.o. MRN: [id number]  CC: Anxiety   HPI [name] [name] presents for    1. R arm pain: reports pain in R shoulder and elbow area started about 1 months ago off and on. Pain is mostly around the elbow. Worse with lifting with R hand. Does a lot of repetitive motions. Has not tried icing or antiinflammatory.   2. R hand pain: started 4 days ago. He hit his R hand against the wall while dreaming. He developed pain and swelling. He is able to make a fist but not a tight fist.   3. Anxiety: he went to [facility]. Anxiety was confirmed. lexapro was increased to 20 mg daily. He has had symptoms since his teenage years.   Social History  Substance Use Topics  . Smoking status: Former Smoker    Types: Cigars  . Smokeless tobacco: Never Used     Comment: smokes occasional black &mild  . Alcohol use No     Comment: rehab    Outpatient Medications Prior to Visit  Medication Sig Dispense Refill  . albuterol (PROVENTIL HFA;VENTOLIN HFA) 108 (90 Base) MCG/ACT inhaler Inhale 2 puffs into the lungs every 6 (six) hours as needed for wheezing or shortness of breath. 1 Inhaler 5  . escitalopram (LEXAPRO) 20 MG tablet Take 1 tablet (20 mg total) by mouth daily. 30 tablet 5  . fluticasone (FLONASE) 50 MCG/ACT nasal spray Place 2 sprays into both nostrils daily. 16 g 5  . DiphenhydrAMINE HCl, Sleep, (ZZZQUIL) 25 MG CAPS Take 2 capsules by mouth at bedtime.    . fexofenadine (ALLEGRA ALLERGY) 180 MG tablet Take 1 tablet (180 mg total) by mouth daily. (Patient not taking: Reported on 01/04/2017) 30 tablet 5  . omeprazole (PRILOSEC) 20 MG capsule Take 1 capsule (20 mg total) by mouth 2 (two) times daily before a meal. (Patient not taking: Reported on 01/04/2017) 30 capsule 5   No facility-administered medications prior to visit.     ROS Review of Systems  Constitutional: Negative for chills, fatigue, fever and unexpected weight change.    Eyes: Negative for visual disturbance.  Respiratory: Negative for cough and shortness of breath.   Cardiovascular: Negative for chest pain, palpitations and leg swelling.  Gastrointestinal: Negative for abdominal pain, blood in stool, constipation, diarrhea, nausea and vomiting.  Endocrine: Negative for polydipsia, polyphagia and polyuria.  Musculoskeletal: Positive for arthralgias. Negative for back pain, gait problem, myalgias and neck pain.  Skin: Negative for rash.  Allergic/Immunologic: Negative for immunocompromised state.  Hematological: Negative for adenopathy. Does not bruise/bleed easily.  Psychiatric/Behavioral: Negative for dysphoric mood, sleep disturbance and suicidal ideas. The patient is nervous/anxious.     Objective:  BP (!) 152/97 (BP Location: Left Arm, Patient Position: Sitting, Cuff Size: Small)   Pulse 62   Temp 98.1 F (36.7 C) (Oral)   Ht 5\' 9"  (1.753 m)   Wt 150 lb 3.2 oz (68.1 kg)   SpO2 98%   BMI 22.18 kg/m   BP/Weight 01/04/2017 11/09/2016 123456  Systolic BP 0000000 0000000 [id number]  Diastolic BP 97 84 75  Wt. (Lbs) 150.2 144.8 139.8  BMI 22.18 21.38 20.64  Some encounter information is confidential and restricted. Go to Review Flowsheets activity to see all data.     Physical Exam  Constitutional: He appears well-developed and well-nourished. No distress.  HENT:  Head: Normocephalic and atraumatic.  Neck: Normal range of motion. Neck supple.  Cardiovascular: Normal rate, regular rhythm, normal heart sounds and intact distal pulses.   Pulmonary/Chest: Effort normal and breath sounds normal.  Musculoskeletal: He exhibits no edema.       Right elbow: He exhibits normal range of motion, no swelling, no effusion and no deformity. Tenderness found. Medial epicondyle tenderness noted.       Right hand: He exhibits swelling. Normal sensation noted. Normal strength noted.       Hands: Neurological: He is alert.  Skin: Skin is warm and dry. No rash noted. No  erythema.  Psychiatric: He has a normal mood and affect.   Lab Results  Component Value Date   TSH 0.89 01/04/2017     Assessment & Plan:  [name] [name] was seen today for anxiety.  Diagnoses and all orders for this visit:  Generalized anxiety disorder -     TSH  Chronic pain of both knees  Lateral epicondylitis of right elbow -     naproxen (NAPROSYN) 500 MG tablet; Take 1 tablet (500 mg total) by mouth 2 (two) times daily with a meal.  Strain, MCP, hand, right, initial encounter -     naproxen (NAPROSYN) 500 MG tablet; Take 1 tablet (500 mg total) by mouth 2 (two) times daily with a meal.  Other orders -     Turmeric 500 MG CAPS; Take 500 mg by mouth daily. -     Glucosamine-Chondroitin 750-600 MG TABS; Take 2 tablets by mouth daily.   There are no diagnoses linked to this encounter.  No orders of the defined types were placed in this encounter.   Follow-up: Return in about 6 weeks (around 02/15/2017) for R elbow pain .   Boykin Nearing MD

## 2017-01-05 DIAGNOSIS — S66811A Strain of other specified muscles, fascia and tendons at wrist and hand level, right hand, initial encounter: Secondary | ICD-10-CM | POA: Insufficient documentation

## 2017-01-05 DIAGNOSIS — M7711 Lateral epicondylitis, right elbow: Secondary | ICD-10-CM | POA: Insufficient documentation

## 2017-01-05 NOTE — Assessment & Plan Note (Signed)
Confirmed lexapro increased to 20 mg daily TSH checked and still normal

## 2017-01-05 NOTE — Assessment & Plan Note (Signed)
Lateral elbow pain consistent with lateral epicondylitis Plan: Ice  Rest NSAID

## 2017-01-05 NOTE — Assessment & Plan Note (Signed)
Strain following trauma No deformity or bruising to suggest fracture  Ice NSAID

## 2017-01-17 NOTE — Progress Notes (Signed)
Pt does not have working phone number

## 2017-03-01 ENCOUNTER — Ambulatory Visit (HOSPITAL_COMMUNITY)
Admission: EM | Admit: 2017-03-01 | Discharge: 2017-03-01 | Disposition: A | Discharge status: Home / Self Care | Payer: Self-pay | Attending: Family Medicine | Admitting: Family Medicine

## 2017-03-01 ENCOUNTER — Encounter (HOSPITAL_COMMUNITY): Payer: Self-pay | Admitting: Emergency Medicine

## 2017-03-01 DIAGNOSIS — J9801 Acute bronchospasm: Secondary | ICD-10-CM

## 2017-03-01 DIAGNOSIS — R0982 Postnasal drip: Secondary | ICD-10-CM

## 2017-03-01 DIAGNOSIS — L309 Dermatitis, unspecified: Secondary | ICD-10-CM

## 2017-03-01 DIAGNOSIS — J301 Allergic rhinitis due to pollen: Secondary | ICD-10-CM

## 2017-03-01 MED ORDER — IPRATROPIUM BROMIDE 0.06 % NA SOLN: 2.0000 | Freq: Four times a day (QID) | NASAL | 12 refills | Status: DC

## 2017-03-01 MED ORDER — ALBUTEROL SULFATE HFA 108 (90 BASE) MCG/ACT IN AERS: 2.0000 | INHALATION_SPRAY | RESPIRATORY_TRACT | 0 refills | Status: DC | PRN

## 2017-03-01 MED ORDER — TRIAMCINOLONE ACETONIDE 0.1 % EX CREA: 1.0000 "application " | TOPICAL_CREAM | Freq: Two times a day (BID) | CUTANEOUS | 0 refills | Status: DC

## 2017-03-01 MED ORDER — PREDNISONE 20 MG PO TABS: ORAL_TABLET | ORAL | 0 refills | Status: DC

## 2017-03-01 NOTE — ED Provider Notes (Signed)
CSN: [id number]     Arrival date & time 03/01/17  1515 History   First MD Initiated Contact with Patient 03/01/17 1539     Chief Complaint  Patient presents with  . URI   (Consider location/radiation/quality/duration/timing/severity/associated sxs/prior Treatment) 56 year old male with history of sarcoidosis, alcohol abuse, anxiety and reflux presents with a complaint of cough, feels a need to have to call for fluid from his chest is also having some myalgias and low back pain a little bit or shortness of breath but denies fever. Complains of runny nose and upper as for congestion. Symptoms started about a week ago.      Past Medical History:  Diagnosis Date  . Alcohol abuse    Last Drink on Sep 2015  . Anxiety    About 56 years of age  . Carpal tunnel syndrome of right wrist Dx 2015  . Depression    At 56 years of age  . GERD (gastroesophageal reflux disease) Dx 2007  . Hepatitis, alcoholic, acute 4098  . Hyperlipidemia    borderline, diet controlled  . Pancreatitis Dx 2014  . Pneumonia 11/2014  . 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 Reflux Dx 2007  . 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 carpel tunnel release Right 07/28/2014    done in [place]   . ELBOW SURGERY    . KNEE ARTHROSCOPY    . KNEE SURGERY Left    15 years ago  . LIPOMA EXCISION N/A 05/10/2016   Procedure: EXCISION OF SCALP LIPOMA;  Surgeon: Johnathan Hausen, MD;  Location: [facility];  Service: General;  Laterality: N/A;   Family History  Problem Relation Age of Onset  . Diabetes Father   . Hypertension Father   . Hypertension Paternal Grandfather   . Alcohol abuse Paternal Grandfather   . Alcohol abuse Maternal Grandfather   . Alcohol abuse Maternal Grandmother   . Alcohol abuse Paternal Grandmother   . Colon cancer Neg Hx   . Rectal cancer Neg Hx   . Stomach cancer Neg Hx    Social History  Substance Use Topics   . Smoking status: Former Smoker    Types: Cigars  . Smokeless tobacco: Never Used     Comment: smokes occasional black &mild  . Alcohol use No     Comment: rehab    Review of Systems  Constitutional: Positive for activity change. Negative for diaphoresis, fatigue and fever.  HENT: Positive for congestion, postnasal drip, rhinorrhea and sore throat. Negative for ear pain, facial swelling and trouble swallowing.   Eyes: Negative for pain, discharge and redness.  Respiratory: Positive for cough and shortness of breath. Negative for chest tightness.   Cardiovascular: Negative.   Gastrointestinal: Negative.   Musculoskeletal: Negative.  Negative for neck pain and neck stiffness.  Skin: Positive for rash.  Neurological: Negative.   All other systems reviewed and are negative.   Allergies  Oxycodone  Home Medications   Prior to Admission medications   Medication Sig Start Date End Date Taking? Authorizing Provider  DiphenhydrAMINE HCl, Sleep, (ZZZQUIL) 25 MG CAPS Take 2 capsules by mouth at bedtime.   Yes Historical Provider, MD  escitalopram (LEXAPRO) 20 MG tablet Take 1 tablet (20 mg total) by mouth daily. 11/09/16  Yes Josalyn Funches, MD  albuterol (PROVENTIL HFA;VENTOLIN HFA) 108 (90 Base) MCG/ACT inhaler Inhale 2 puffs into the lungs every 4 (four) hours as needed for wheezing or  shortness of breath. 03/01/17   Janne Napoleon, NP  fluticasone (FLONASE) 50 MCG/ACT nasal spray Place 2 sprays into both nostrils daily. 11/09/16   Josalyn Funches, MD  Glucosamine-Chondroitin 750-600 MG TABS Take 2 tablets by mouth daily. 01/04/17   Josalyn Funches, MD  ipratropium (ATROVENT) 0.06 % nasal spray Place 2 sprays into both nostrils 4 (four) times daily. 03/01/17   Janne Napoleon, NP  naproxen (NAPROSYN) 500 MG tablet Take 1 tablet (500 mg total) by mouth 2 (two) times daily with a meal. 01/04/17   Boykin Nearing, MD  predniSONE (DELTASONE) 20 MG tablet Take 3 tabs po on first day, 2 tabs second day, 2  tabs third day, 1 tab fourth day, 1 tab 5th day. Take with food. 03/01/17   Janne Napoleon, NP  triamcinolone cream (KENALOG) 0.1 % Apply 1 application topically 2 (two) times daily. 03/01/17   Janne Napoleon, NP  Turmeric 500 MG CAPS Take 500 mg by mouth daily. 01/04/17   Boykin Nearing, MD   Meds Ordered and Administered this Visit  Medications - No data to display  BP 133/83 (BP Location: Left Arm)   Pulse 72   Temp 97.9 F (36.6 C) (Oral)   Resp 18   SpO2 100%  No data found.   Physical Exam  Constitutional: He is oriented to person, place, and time. He appears well-developed and well-nourished. No distress.  HENT:  Right Ear: External ear normal.  Left Ear: External ear normal.  Mouth/Throat: No oropharyngeal exudate.  Oropharynx with minor erythema and moderate amount of clear PND. A lateral TMs normal  Eyes: EOM are normal.  Neck: Normal range of motion. Neck supple.  Cardiovascular: Normal rate, regular rhythm and normal heart sounds.   Pulmonary/Chest: Effort normal. No respiratory distress. He has no rales.  With tidal volume lungs are clear. Good air movement. Forced expiration with bilateral end-expiratory wheeze associated with cough. Deep inspiration produces coughing spasms as well.  Musculoskeletal: Normal range of motion. He exhibits no edema.  Lymphadenopathy:    He has no cervical adenopathy.  Neurological: He is alert and oriented to person, place, and time.  Skin: Skin is warm and dry.  Psychiatric: He has a normal mood and affect.  Nursing note and vitals reviewed.   Urgent Care Course     Procedures (including critical care time)  Labs Review Labs Reviewed - No data to display  Imaging Review No results found.   Visual Acuity Review  Right Eye Distance:   Left Eye Distance:   Bilateral Distance:    Right Eye Near:   Left Eye Near:    Bilateral Near:         MDM   1. Acute seasonal allergic rhinitis due to pollen   2. Cough due to  bronchospasm   3. PND (post-nasal drip)   4. Eczema, unspecified type    Use the Atrovent nasal spray to help with drainage from the nose. Use the albuterol inhaler 2 puffs every 4 hours as needed for cough and wheezing, take the prednisone daily as directed and take with food. Allegra or Zyrtec daily as needed for drainage and runny nose. For stronger antihistamine may take Chlor-Trimeton 2 to 4 mg every 4 to 6 hours, may cause drowsiness. Saline nasal spray used frequently. Ibuprofen 600 mg every 6 hours as needed for pain, discomfort or fever. Drink plenty of fluids and stay well-hydrated. Flonase or Rhinocort nasal spray daily     Janne Napoleon, NP 03/01/17 [phone]  Janne Napoleon, NP 03/01/17 1606

## 2017-03-01 NOTE — ED Triage Notes (Signed)
Pt c/o cold sx onset: 1 week ++  Sx include: BA, prod cough, chills, abd pain  Denies: fevers  Taking: OTC cold meds w/temp relief.   A&O x4... NAD

## 2017-03-01 NOTE — Discharge Instructions (Signed)
Use the Atrovent nasal spray to help with drainage from the nose. Use the albuterol inhaler 2 puffs every 4 hours as needed for cough and wheezing, take the prednisone daily as directed and take with food. Allegra or Zyrtec daily as needed for drainage and runny nose. For stronger antihistamine may take Chlor-Trimeton 2 to 4 mg every 4 to 6 hours, may cause drowsiness. Saline nasal spray used frequently. Ibuprofen 600 mg every 6 hours as needed for pain, discomfort or fever. Drink plenty of fluids and stay well-hydrated. Flonase or Rhinocort nasal spray daily

## 2017-03-17 IMAGING — DX DG CHEST 2V
2 series · 2 of 2 positions shown · non-contrast
Comparison: PA and lateral chest x-ray July 29, 2016

CLINICAL DATA: Several week history of mid chest pain and shortness
of breath, increased over the past several days. History of
sarcoidosis

EXAM:
CHEST  2 VIEW

[chest pa]
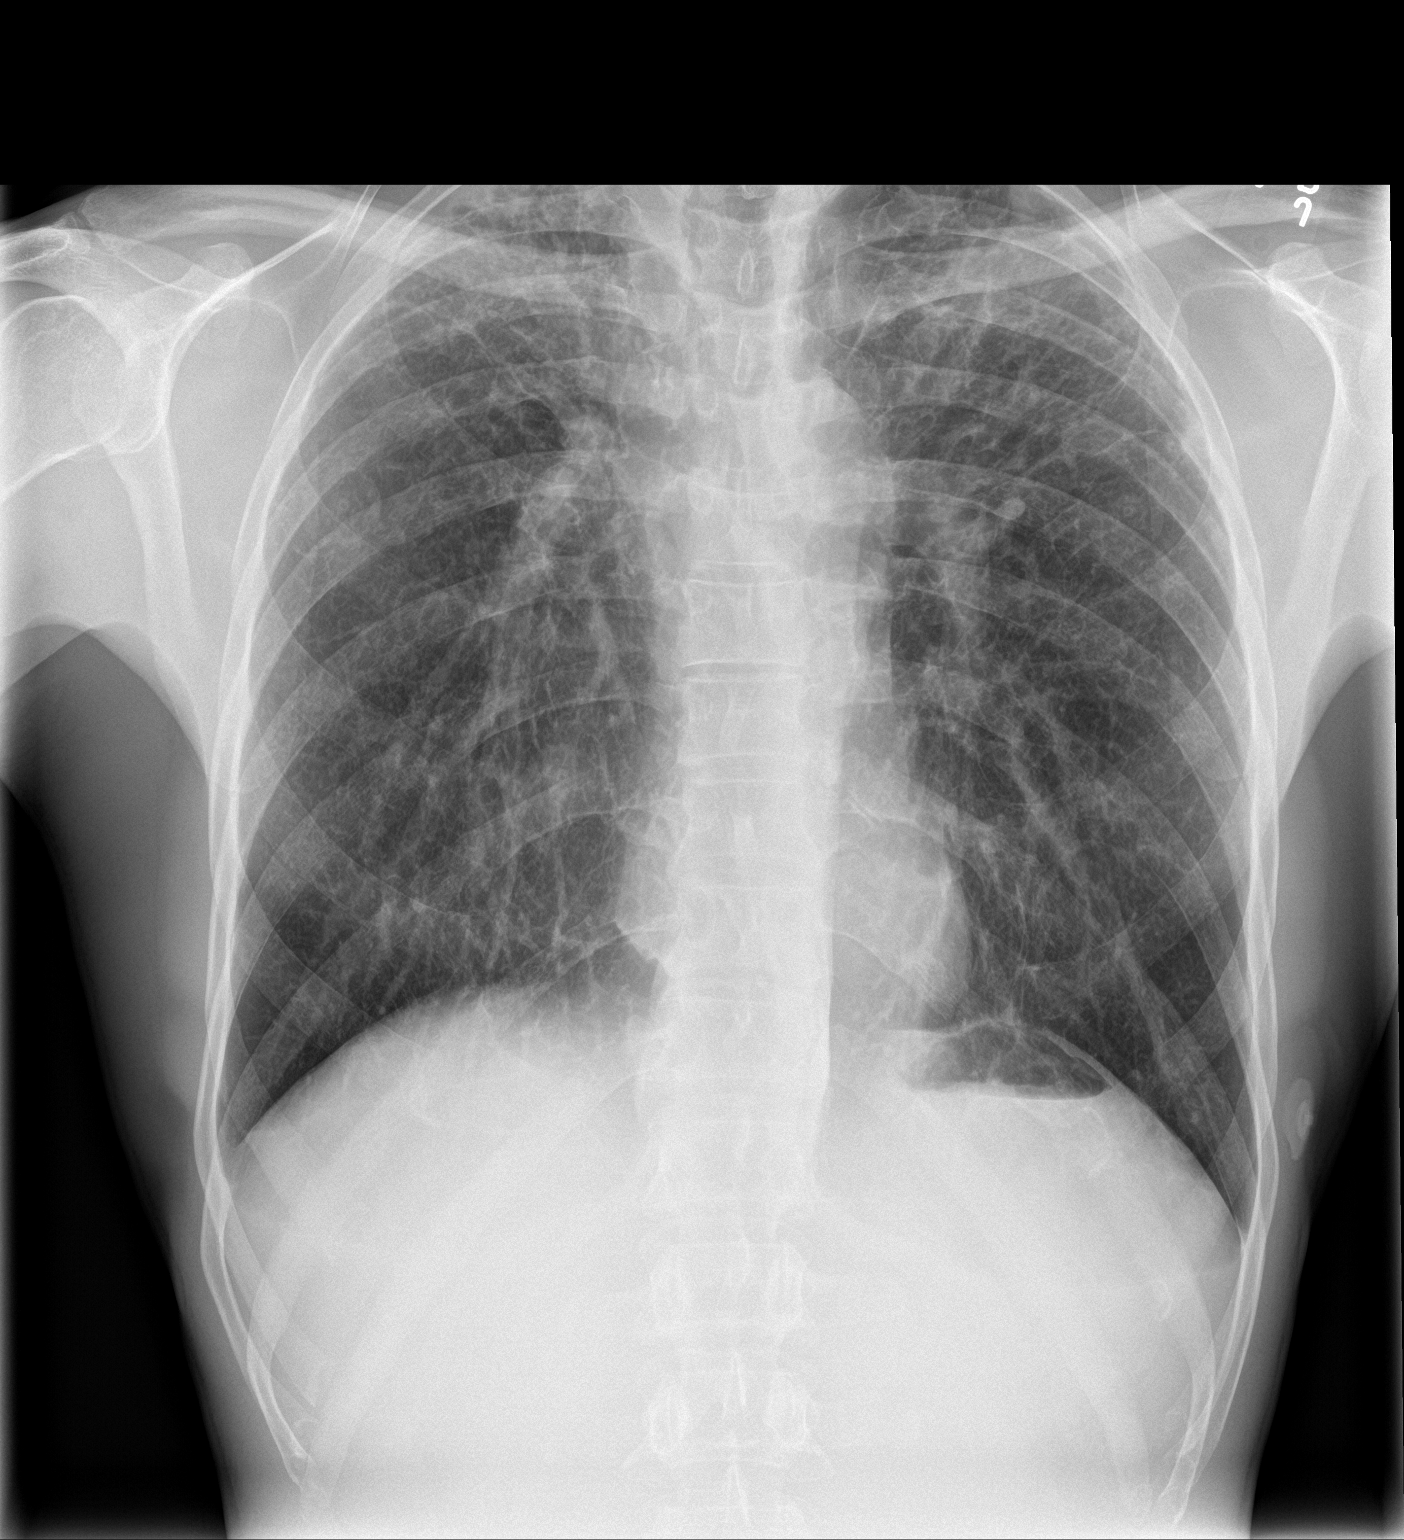

[chest lat]
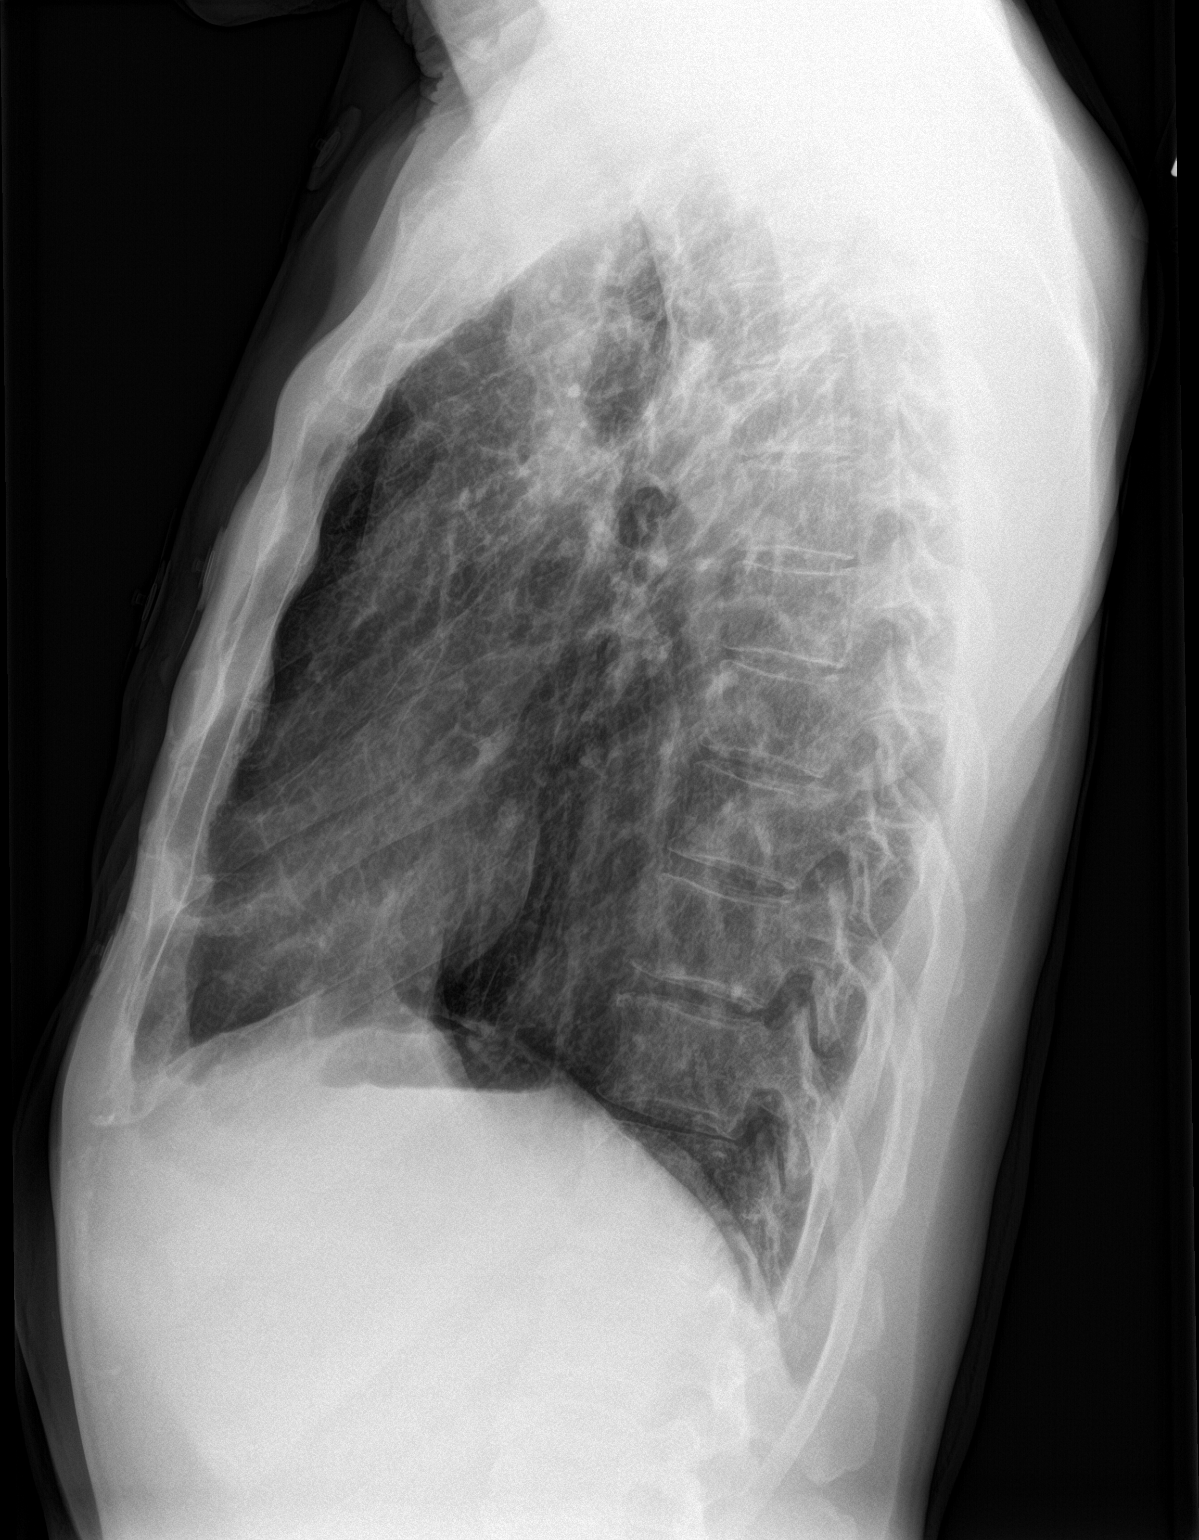

[2 of 2 positions shown; findings below may reference images not displayed]

FINDINGS: The lungs remain well-expanded. The interstitial markings remain
increased diffusely and are most conspicuous in the upper lobes.
These findings are overall slightly more conspicuous than on the
study of 3 and half weeks ago. No classic air bronchograms are
observed. There is no pleural effusion or pulmonary vascular
congestion. The hilar structures ends structures are chronically
retracted superiorly. The heart is normal in size. There is no
pneumothorax. The bony thorax is unremarkable.
IMPRESSION: Chronic interstitial changes consistent with known sarcoidosis.
There may be superimposed acute bronchitic change. There is no
alveolar pneumonia nor CHF.

## 2017-03-18 ENCOUNTER — Emergency Department (HOSPITAL_COMMUNITY)
Admission: EM | Admit: 2017-03-18 | Discharge: 2017-03-18 | Disposition: A | Discharge status: Home / Self Care | Payer: Self-pay | Attending: Emergency Medicine | Admitting: Emergency Medicine

## 2017-03-18 ENCOUNTER — Encounter (HOSPITAL_COMMUNITY): Payer: Self-pay | Admitting: Emergency Medicine

## 2017-03-18 ENCOUNTER — Emergency Department (HOSPITAL_COMMUNITY): Payer: Self-pay

## 2017-03-18 DIAGNOSIS — Z87891 Personal history of nicotine dependence: Secondary | ICD-10-CM | POA: Insufficient documentation

## 2017-03-18 DIAGNOSIS — Z79899 Other long term (current) drug therapy: Secondary | ICD-10-CM | POA: Insufficient documentation

## 2017-03-18 DIAGNOSIS — R05 Cough: Secondary | ICD-10-CM | POA: Insufficient documentation

## 2017-03-18 DIAGNOSIS — F411 Generalized anxiety disorder: Secondary | ICD-10-CM

## 2017-03-18 DIAGNOSIS — R0602 Shortness of breath: Secondary | ICD-10-CM

## 2017-03-18 DIAGNOSIS — R059 Cough, unspecified: Secondary | ICD-10-CM

## 2017-03-18 LAB — BASIC METABOLIC PANEL
Anion gap: 7 (ref 5–15)
BUN: 16 mg/dL (ref 6–20)
CO2: 24 mmol/L (ref 22–32)
Calcium: 9 mg/dL (ref 8.9–10.3)
Chloride: 105 mmol/L (ref 101–111)
Creatinine, Ser: 1.02 mg/dL (ref 0.61–1.24)
GFR calc Af Amer: >60 mL/min (ref >60)
GFR calc non Af Amer: >60 mL/min (ref >60)
Glucose, Bld: 96 mg/dL (ref 65–99)
Potassium: 4.1 mmol/L (ref 3.5–5.1)
Sodium: 136 mmol/L (ref 135–145)

## 2017-03-18 LAB — CBC
HCT: 38 % — ABNORMAL LOW (ref 39.0–52.0)
Hemoglobin: 12.2 g/dL — ABNORMAL LOW (ref 13.0–17.0)
MCH: 25.1 pg — ABNORMAL LOW (ref 26.0–34.0)
MCHC: 32.1 g/dL (ref 30.0–36.0)
MCV: 78.2 fL (ref 78.0–100.0)
Platelets: 189 10*3/uL (ref 150–400)
RBC: 4.86 MIL/uL (ref 4.22–5.81)
RDW: 15.3 % (ref 11.5–15.5)
WBC: 6.1 10*3/uL (ref 4.0–10.5)

## 2017-03-18 LAB — I-STAT TROPONIN, ED
Troponin i, poc: 0 ng/mL (ref 0.00–0.08)
Troponin i, poc: 0 ng/mL (ref 0.00–0.08)

## 2017-03-18 MED ORDER — ESCITALOPRAM OXALATE 20 MG PO TABS: 20.0000 mg | ORAL_TABLET | Freq: Every day | ORAL | 0 refills | Status: DC

## 2017-03-18 MED ORDER — LORAZEPAM 0.5 MG PO TABS
0.5000 mg | ORAL_TABLET | Freq: Once | ORAL | Status: AC
  Administered 2017-03-18: 0.5 mg via ORAL
  Filled 2017-03-18: qty 1

## 2017-03-18 MED ORDER — PREDNISONE 20 MG PO TABS
60.0000 mg | ORAL_TABLET | Freq: Once | ORAL | Status: AC
  Administered 2017-03-18: 60 mg via ORAL
  Filled 2017-03-18: qty 3

## 2017-03-18 MED ORDER — PREDNISONE 20 MG PO TABS: 40.0000 mg | ORAL_TABLET | Freq: Every day | ORAL | 0 refills | Status: DC

## 2017-03-18 MED ORDER — ALBUTEROL SULFATE HFA 108 (90 BASE) MCG/ACT IN AERS: 1.0000 | INHALATION_SPRAY | Freq: Four times a day (QID) | RESPIRATORY_TRACT | 0 refills | Status: DC | PRN

## 2017-03-18 MED ORDER — AZITHROMYCIN 250 MG PO TABS: ORAL_TABLET | ORAL | 0 refills | Status: DC

## 2017-03-18 MED ORDER — HYDROCOD POLST-CPM POLST ER 10-8 MG/5ML PO SUER
5.0000 mL | Freq: Once | ORAL | Status: AC
  Administered 2017-03-18: 5 mL via ORAL
  Filled 2017-03-18: qty 5

## 2017-03-18 MED ORDER — FLUTICASONE PROPIONATE 50 MCG/ACT NA SUSP
2.0000 | Freq: Every day | NASAL | 12 refills | Status: DC
Start: 2017-03-18 — End: 2017-07-04

## 2017-03-18 MED ORDER — IPRATROPIUM-ALBUTEROL 0.5-2.5 (3) MG/3ML IN SOLN
3.0000 mL | Freq: Once | RESPIRATORY_TRACT | Status: AC
  Administered 2017-03-18: 3 mL via RESPIRATORY_TRACT
  Filled 2017-03-18: qty 3

## 2017-03-18 NOTE — ED Notes (Signed)
Pt departed in NAD, refused use of wheelchair.  

## 2017-03-18 NOTE — ED Provider Notes (Signed)
[facility] DEPT Provider Note   CSN: [id number] Arrival date & time: 03/18/17  1618     History   Chief Complaint Chief Complaint  Patient presents with  . Cough  . Shortness of Breath    HPI [name] [name] is a 56 y.o. male.  HPI 56 year old African-American male past medical history significant for depression, alcohol abuse, history of sarcoidosis, pneumonia, pancreatitis that presents to the ED today with complaints of productive cough for the past 2 weeks. Patient states that he's been coughing up light green to white sputum that has been increasing with the past 2 weeks. Also notes associated shortness of breath with the cough and told chest aching with coughing. States he's had walking pneumonia 2 times in the past 3 years and this feels similar. Patient was seen by his PCP on beginning of April for allergic rhinitis. Patient denies any cardiac history. Patient denies any lower extremity edema, calf tenderness, hemoptysis, history of DVT, prolonged immobilizations, recent hospitalizations/surgeries. Patient has not tried nothing for his symptoms. States that he does not have a history of allergies however this could be related. Patient describes the chest pain as a dull ache that is associated with cough. Denies any chest pain when he is not coughing. Denies any chest pain or shortness of breath at this time. Patient is endorses associated rhinorrhea, sore throat but denies any fever, chills, headache, vision changes, lightheadedness, dizziness, abdominal pain, nausea, emesis, diaphoresis, urinary symptoms, change in bowel habits. Past Medical History:  Diagnosis Date  . Alcohol abuse    Last Drink on Sep 2015  . Anxiety    About 56 years of age  . Carpal tunnel syndrome of right wrist Dx 2015  . Depression    At 56 years of age  . GERD (gastroesophageal reflux disease) Dx 2007  . Hepatitis, alcoholic, acute 7673  . Hyperlipidemia    borderline, diet controlled  .  Pancreatitis Dx 2014  . Pneumonia 11/2014  . 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 Reflux Dx 2007  . 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 Lateral epicondylitis of right elbow 01/05/2017  . Strain, MCP, hand, right, initial encounter 01/05/2017  . 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 Bilateral knee pain 01/11/2016  . Fatigue 09/27/2015  . Chronic pain of right wrist 07/07/2015  . Carpal tunnel syndrome of right wrist 05/20/2015  . Dislocation of right shoulder joint 05/20/2015  . Tear of medial meniscus of right knee 03/24/2015  . 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 Chronic cough 01/15/2015  . Onychomycosis of toenail 12/15/2014  . Seborrhea 11/11/2014  . GERD (gastroesophageal reflux disease)   . Alcohol abuse 01/19/2014  . Generalized anxiety disorder 09/02/2013    Past Surgical History:  Procedure Laterality Date  . carpel tunnel release Right 07/28/2014    done in [place]   . ELBOW SURGERY    . KNEE ARTHROSCOPY    . KNEE SURGERY Left    15 years ago  . LIPOMA EXCISION N/A 05/10/2016   Procedure: EXCISION OF SCALP LIPOMA;  Surgeon: Johnathan Hausen, MD;  Location: [facility];  Service: General;  Laterality: N/A;       Home Medications    Prior to Admission medications   Medication Sig Start Date End Date Taking? Authorizing Provider  albuterol (PROVENTIL HFA;VENTOLIN HFA) 108 (90 Base) MCG/ACT inhaler Inhale 2 puffs into the lungs every 4 (four) hours as  needed for wheezing or shortness of breath. 03/01/17   Janne Napoleon, NP  albuterol (PROVENTIL HFA;VENTOLIN HFA) 108 (90 Base) MCG/ACT inhaler Inhale 1-2 puffs into the lungs every 6 (six) hours as needed for wheezing or shortness of breath. 03/18/17   Doristine Devoid, PA-C  azithromycin (ZITHROMAX) 250 MG tablet Take 2 tabs PO x 1 dose, then 1 tab PO QD x 4 days 03/18/17   Doristine Devoid, PA-C  DiphenhydrAMINE HCl, Sleep,  (ZZZQUIL) 25 MG CAPS Take 2 capsules by mouth at bedtime.    Historical Provider, MD  escitalopram (LEXAPRO) 20 MG tablet Take 1 tablet (20 mg total) by mouth daily. 11/09/16   Josalyn Funches, MD  fluticasone (FLONASE) 50 MCG/ACT nasal spray Place 2 sprays into both nostrils daily. 11/09/16   Josalyn Funches, MD  fluticasone (FLONASE) 50 MCG/ACT nasal spray Place 2 sprays into both nostrils daily. 03/18/17   Doristine Devoid, PA-C  Glucosamine-Chondroitin 750-600 MG TABS Take 2 tablets by mouth daily. 01/04/17   Josalyn Funches, MD  ipratropium (ATROVENT) 0.06 % nasal spray Place 2 sprays into both nostrils 4 (four) times daily. 03/01/17   Janne Napoleon, NP  naproxen (NAPROSYN) 500 MG tablet Take 1 tablet (500 mg total) by mouth 2 (two) times daily with a meal. 01/04/17   Boykin Nearing, MD  predniSONE (DELTASONE) 20 MG tablet Take 3 tabs po on first day, 2 tabs second day, 2 tabs third day, 1 tab fourth day, 1 tab 5th day. Take with food. 03/01/17   Janne Napoleon, NP  predniSONE (DELTASONE) 20 MG tablet Take 2 tablets (40 mg total) by mouth daily with breakfast. 03/18/17   Doristine Devoid, PA-C  triamcinolone cream (KENALOG) 0.1 % Apply 1 application topically 2 (two) times daily. 03/01/17   Janne Napoleon, NP  Turmeric 500 MG CAPS Take 500 mg by mouth daily. 01/04/17   Boykin Nearing, MD    Family History Family History  Problem Relation Age of Onset  . Diabetes Father   . Hypertension Father   . Hypertension Paternal Grandfather   . Alcohol abuse Paternal Grandfather   . Alcohol abuse Maternal Grandfather   . Alcohol abuse Maternal Grandmother   . Alcohol abuse Paternal Grandmother   . Colon cancer Neg Hx   . Rectal cancer Neg Hx   . Stomach cancer Neg Hx     Social History Social History  Substance Use Topics  . Smoking status: Former Smoker    Types: Cigars  . Smokeless tobacco: Never Used     Comment: smokes occasional black &mild  . Alcohol use No     Comment: rehab     Allergies     Oxycodone   Review of Systems Review of Systems  Constitutional: Negative for chills, diaphoresis and fever.  HENT: Positive for congestion, rhinorrhea and sore throat.   Respiratory: Positive for cough and shortness of breath. Negative for wheezing.   Cardiovascular: Positive for chest pain. Negative for palpitations and leg swelling.  Gastrointestinal: Negative for abdominal pain, diarrhea, nausea and vomiting.  Genitourinary: Negative for dysuria, flank pain, frequency, hematuria and urgency.  Skin: Negative.   Neurological: Negative for dizziness, syncope, weakness, light-headedness and headaches.     Physical Exam Updated Vital Signs BP 121/79   Pulse 72   Temp 98.6 F (37 C) (Oral)   Resp 20   Ht 5\' 9"  (1.753 m)   Wt 68.9 kg   SpO2 97%   BMI 22.45 kg/m   Physical Exam  Constitutional: He is oriented to person, place, and time. He appears well-developed and well-nourished. No distress.  Patient is nontoxic appearing.  HENT:  Head: Normocephalic and atraumatic.  Right Ear: Tympanic membrane, external ear and ear canal normal.  Left Ear: Tympanic membrane, external ear and ear canal normal.  Nose: Mucosal edema and rhinorrhea present.  Mouth/Throat: Uvula is midline and mucous membranes are normal. Posterior oropharyngeal erythema present. No oropharyngeal exudate or posterior oropharyngeal edema.  Eyes: Conjunctivae and EOM are normal. Pupils are equal, round, and reactive to light. Right eye exhibits no discharge. Left eye exhibits no discharge. No scleral icterus.  Neck: Normal range of motion. Neck supple.  No nuchal rigidity.  Cardiovascular: Normal rate, regular rhythm, normal heart sounds and intact distal pulses.   Pulmonary/Chest: Effort normal and breath sounds normal. No respiratory distress. He has no wheezes. He has no rales. He exhibits no tenderness.  Abdominal: Soft. Bowel sounds are normal.  Musculoskeletal: Normal range of motion.  Neurological: He  is alert and oriented to person, place, and time.  Skin: Skin is warm and dry. No pallor.  Nursing note and vitals reviewed.    ED Treatments / Results  Labs (all labs ordered are listed, but only abnormal results are displayed) Labs Reviewed  CBC - Abnormal; Notable for the following:       Result Value   Hemoglobin 12.2 (*)    HCT 38.0 (*)    MCH 25.1 (*)    All other components within normal limits  BASIC METABOLIC PANEL  I-STAT TROPOININ, ED  I-STAT TROPOININ, ED    EKG  EKG Interpretation  Date/Time:  Sunday March 18 2017 17:00:21 EDT Ventricular Rate:  78 PR Interval:  120 QRS Duration: 92 QT Interval:  352 QTC Calculation: 401 R Axis:   80 Text Interpretation:  Normal sinus rhythm Moderate voltage criteria for LVH, may be normal variant Nonspecific ST and T wave abnormality Abnormal ECG No significant change since last tracing Confirmed by ZACKOWSKI  MD, SCOTT [phone]) on 03/18/2017 8:45:22 PM       Radiology Dg Chest 2 View  Result Date: 03/18/2017 CLINICAL DATA:  Shortness of breath and productive cough. History of sarcoidosis. EXAM: CHEST  2 VIEW COMPARISON:  11/09/2016 FINDINGS: Biapical pleural-parenchymal scarring is evident, right greater than left, with cranial retraction of the hila bilaterally. Hyperexpansion of the lower lungs again noted. Interstitial markings are diffusely coarsened with chronic features. The cardiopericardial silhouette is within normal limits for size. The visualized bony structures of the thorax are intact. IMPRESSION: Stable. Pleural-parenchymal scarring in the upper lungs without new or progressive interval findings. Electronically Signed   By: Misty Stanley M.D.   On: 03/18/2017 18:04    Procedures Procedures (including critical care time)  Medications Ordered in ED Medications  ipratropium-albuterol (DUONEB) 0.5-2.5 (3) MG/3ML nebulizer solution 3 mL (3 mLs Nebulization Given 03/18/17 1852)  predniSONE (DELTASONE) tablet 60 mg  (60 mg Oral Given 03/18/17 1852)  LORazepam (ATIVAN) tablet 0.5 mg (0.5 mg Oral Given 03/18/17 1852)  chlorpheniramine-HYDROcodone (TUSSIONEX) 10-8 MG/5ML suspension 5 mL (5 mLs Oral Given 03/18/17 1852)     Initial Impression / Assessment and Plan / ED Course  I have reviewed the triage vital signs and the nursing notes.  Pertinent labs & imaging results that were available during my care of the patient were reviewed by me and considered in my medical decision making (see chart for details).     Patient presents to the ED with complaints  of productive cough, shortness of breath, chest pain secondary to cough. Patient states he's had pneumonia in the past and this feels similar. Patient denies any fever. He is afebrile and not tachycardic on exam. Pressures normal. No leukocytosis is noted. X-ray shows no focal infiltrate. Patient does have history of sarcoidosis and scarring is again noted on prior x-rays. EKG with no change from prior tracing shows no ischemic changes. Negative delta troponins. Presentation is unconcern for ACS or PE. Patient is PERC positive due to age but [place] low risk. Patient was given DuoNeb in the ED with improvement in his breathing. Cough was treated. Patient was also given prednisone. Patient felt much improved on discharge. States that he does have anxiety and is out of his Lexapro has appointment in 1 week with his PCP to have it refilled. Asking for a week's supply until he can see his PCP in one week. We'll give refill. Feel the patient's cough and shortness of breath likely due to bronchospasm is from allergic rhinitis versus chronic scarring. Patient is to follow-up with his primary care doctor for further referral to possibly pulmonology. Patient has been given strict return precautions. Patient verbalized understanding the plan of care and all questions were answered prior to discharge. He is hemodynamically stable with normal vital signs in no acute distress on  discharge.  Final Clinical Impressions(s) / ED Diagnoses   Final diagnoses:  Cough  Shortness of breath    New Prescriptions New Prescriptions   ALBUTEROL (PROVENTIL HFA;VENTOLIN HFA) 108 (90 BASE) MCG/ACT INHALER    Inhale 1-2 puffs into the lungs every 6 (six) hours as needed for wheezing or shortness of breath.   AZITHROMYCIN (ZITHROMAX) 250 MG TABLET    Take 2 tabs PO x 1 dose, then 1 tab PO QD x 4 days   FLUTICASONE (FLONASE) 50 MCG/ACT NASAL SPRAY    Place 2 sprays into both nostrils daily.   PREDNISONE (DELTASONE) 20 MG TABLET    Take 2 tablets (40 mg total) by mouth daily with breakfast.     Doristine Devoid, PA-C 03/19/17 [place], MD 03/21/17 [phone]

## 2017-03-18 NOTE — ED Notes (Signed)
Pt able to ambulate efficently while maintaing POX above 95%

## 2017-03-18 NOTE — ED Triage Notes (Signed)
Pt reports productive cough with light green and white phlegm x 2 weeks.  Also reports sob with dull pain across chest.  States it feels like walking pneumonia.

## 2017-03-18 NOTE — ED Notes (Signed)
Patient transported to X-ray 

## 2017-03-18 NOTE — Discharge Instructions (Signed)
Chest x-ray shows no pneumonia. All other labs are reassuring. EKG without any changes from prior ones. Please use the albuterol inhaler as needed. Take the azithromycin which is antibiotic to cover for pneumonia. He is to [facility] for allergies and rhinorrhea. Take the prednisone starting tomorrow for 3 days. Follow up with her primary doctor as soon as possible to have further follow-up. Return to the ED if your symptoms worsen.

## 2017-03-20 MED FILL — AZITHROMYCIN 250 MG TABLET: 250 | 5 days supply | Qty: 6 | Fill #0

## 2017-03-20 MED FILL — ?PREDNISONE 20 MG TABLET: 20 | 3 days supply | Qty: 6 | Fill #0

## 2017-03-20 MED FILL — FLUTICASONE PROP 50 MCG SPR: 50 | 30 days supply | Qty: 16 | Fill #0

## 2017-03-20 MED FILL — ?ESCITALOPRAM 20 MG TABLET: 20 | 15 days supply | Qty: 15 | Fill #0

## 2017-03-20 MED FILL — !VENTOLIN HFA INHALER: 108 (90 BAS | 25 days supply | Qty: 18 | Fill #0

## 2017-03-26 ENCOUNTER — Encounter: Payer: Self-pay | Admitting: Family Medicine

## 2017-03-26 ENCOUNTER — Ambulatory Visit: Payer: Self-pay | Attending: Family Medicine | Admitting: Family Medicine

## 2017-03-26 VITALS — BP 123/69 | HR 79 | Temp 98.1°F | Ht 69.0 in | Wt 148.2 lb

## 2017-03-26 DIAGNOSIS — Z87891 Personal history of nicotine dependence: Secondary | ICD-10-CM | POA: Insufficient documentation

## 2017-03-26 DIAGNOSIS — Z79899 Other long term (current) drug therapy: Secondary | ICD-10-CM | POA: Insufficient documentation

## 2017-03-26 DIAGNOSIS — R202 Paresthesia of skin: Secondary | ICD-10-CM | POA: Insufficient documentation

## 2017-03-26 DIAGNOSIS — F1010 Alcohol abuse, uncomplicated: Secondary | ICD-10-CM | POA: Insufficient documentation

## 2017-03-26 DIAGNOSIS — D860 Sarcoidosis of lung: Secondary | ICD-10-CM | POA: Insufficient documentation

## 2017-03-26 DIAGNOSIS — F411 Generalized anxiety disorder: Secondary | ICD-10-CM | POA: Insufficient documentation

## 2017-03-26 DIAGNOSIS — B351 Tinea unguium: Secondary | ICD-10-CM | POA: Insufficient documentation

## 2017-03-26 DIAGNOSIS — Z833 Family history of diabetes mellitus: Secondary | ICD-10-CM | POA: Insufficient documentation

## 2017-03-26 LAB — POCT GLYCOSYLATED HEMOGLOBIN (HGB A1C): Hemoglobin A1C: 6.1

## 2017-03-26 MED ORDER — TERBINAFINE HCL 250 MG PO TABS: 250.0000 mg | ORAL_TABLET | Freq: Every day | ORAL | 2 refills | Status: DC

## 2017-03-26 MED ORDER — ESCITALOPRAM OXALATE 20 MG PO TABS: 20.0000 mg | ORAL_TABLET | Freq: Every day | ORAL | 5 refills | Status: DC

## 2017-03-26 NOTE — Patient Instructions (Addendum)
[name] [name] was seen today for tingling and follow-up.  Diagnoses and all orders for this visit:  Tingling -     HgB A1c  Generalized anxiety disorder -     escitalopram (LEXAPRO) 20 MG tablet; Take 1 tablet (20 mg total) by mouth daily.  Onychomycosis of toenail -     terbinafine (LAMISIL) 250 MG tablet; Take 1 tablet (250 mg total) by mouth daily.  your A1c has creeped up to 6.1, diabetes is 6.5 or higher for 2 checks Please reduce risk of diabetes by eating a healthy diet low in sugar, bread and starches (corn and potato) Also increase exercise and stress reduction by taking a relaxing walk about 30 minutes 3-4 days per week  f/u in 3 months for A1c check   Dr. Adrian Blackwater

## 2017-03-26 NOTE — Progress Notes (Signed)
Subjective:  Patient ID: [name] [name], male    DOB: [birth year]  Age: 56 y.o. MRN: [id number]  CC: Tingling and Follow-up   HPI [name] [name] has pulmonary sarcoidosis, alcohol abuse, chronic anxiety he  presents for    1. Tingling in great toes: both feet. Worse on R side. For past 3 weeks. Has family history of diabetes. Reports eating high carb diet. Has history of onychomycosis of toenails treated with Lamisil.   2. ED follow up: he was seen on urgent care on 03/01/2017 for cough and rash Diagnosed with allergy symptoms. Treated with prednisone taper, Atrovent nasal spray and kenalog cream.  He went back to the ED on 03/18/2017 for cough. CXR with scarring related to sarcoidosis. No pneumonia. Treated with z-pac, prednisone.   3. Generalized anxiety: compliant with lexapro. Followed by [facility] [place]. Request lexapro refill.   Social History  Substance Use Topics  . Smoking status: Former Smoker    Types: Cigars  . Smokeless tobacco: Never Used     Comment: smokes occasional black &mild  . Alcohol use No     Comment: rehab    Outpatient Medications Prior to Visit  Medication Sig Dispense Refill  . albuterol (PROVENTIL HFA;VENTOLIN HFA) 108 (90 Base) MCG/ACT inhaler Inhale 2 puffs into the lungs every 4 (four) hours as needed for wheezing or shortness of breath. 1 Inhaler 0  . albuterol (PROVENTIL HFA;VENTOLIN HFA) 108 (90 Base) MCG/ACT inhaler Inhale 1-2 puffs into the lungs every 6 (six) hours as needed for wheezing or shortness of breath. 1 Inhaler 0  . escitalopram (LEXAPRO) 20 MG tablet Take 1 tablet (20 mg total) by mouth daily. 15 tablet 0  . fluticasone (FLONASE) 50 MCG/ACT nasal spray Place 2 sprays into both nostrils daily. 16 g 5  . fluticasone (FLONASE) 50 MCG/ACT nasal spray Place 2 sprays into both nostrils daily. 16 g 12  . predniSONE (DELTASONE) 20 MG tablet Take 3 tabs po on first day, 2 tabs second day, 2 tabs third day, 1 tab fourth day, 1 tab 5th day. Take  with food. 9 tablet 0  . predniSONE (DELTASONE) 20 MG tablet Take 2 tablets (40 mg total) by mouth daily with breakfast. 6 tablet 0  . azithromycin (ZITHROMAX) 250 MG tablet Take 2 tabs PO x 1 dose, then 1 tab PO QD x 4 days (Patient not taking: Reported on 03/26/2017) 6 tablet 0  . DiphenhydrAMINE HCl, Sleep, (ZZZQUIL) 25 MG CAPS Take 2 capsules by mouth at bedtime.    . Glucosamine-Chondroitin 750-600 MG TABS Take 2 tablets by mouth daily. (Patient not taking: Reported on 03/26/2017)  0  . ipratropium (ATROVENT) 0.06 % nasal spray Place 2 sprays into both nostrils 4 (four) times daily. (Patient not taking: Reported on 03/26/2017) 15 mL 12  . naproxen (NAPROSYN) 500 MG tablet Take 1 tablet (500 mg total) by mouth 2 (two) times daily with a meal. (Patient not taking: Reported on 03/26/2017) 30 tablet 0  . triamcinolone cream (KENALOG) 0.1 % Apply 1 application topically 2 (two) times daily. (Patient not taking: Reported on 03/26/2017) 30 g 0  . Turmeric 500 MG CAPS Take 500 mg by mouth daily. (Patient not taking: Reported on 03/26/2017)     No facility-administered medications prior to visit.     ROS Review of Systems  Constitutional: Negative for chills, fatigue, fever and unexpected weight change.  Eyes: Negative for visual disturbance.  Respiratory: Negative for cough and shortness of breath.   Cardiovascular: Negative  for chest pain, palpitations and leg swelling.  Gastrointestinal: Negative for abdominal pain, blood in stool, constipation, diarrhea, nausea and vomiting.  Endocrine: Negative for polydipsia, polyphagia and polyuria.  Musculoskeletal: Positive for arthralgias. Negative for back pain, gait problem, myalgias and neck pain.  Skin: Negative for rash.  Allergic/Immunologic: Negative for immunocompromised state.  Hematological: Negative for adenopathy. Does not bruise/bleed easily.  Psychiatric/Behavioral: Negative for dysphoric mood, sleep disturbance and suicidal ideas. The patient  is nervous/anxious.     Objective:  BP 123/69   Pulse 79   Temp 98.1 F (36.7 C) (Oral)   Ht 5\' 9"  (1.753 m)   Wt 148 lb 3.2 oz (67.2 kg)   SpO2 99%   BMI 21.89 kg/m   BP/Weight 03/26/2017 1/82/9937 12/02/9676  Systolic BP 938 101 751  Diastolic BP 69 79 83  Wt. (Lbs) 148.2 152 -  BMI 21.89 22.45 -  Some encounter information is confidential and restricted. Go to Review Flowsheets activity to see all data.     Physical Exam  Constitutional: He appears well-developed and well-nourished. No distress.  HENT:  Head: Normocephalic and atraumatic.  Neck: Normal range of motion. Neck supple.  Cardiovascular: Normal rate, regular rhythm, normal heart sounds and intact distal pulses.   Pulmonary/Chest: Effort normal and breath sounds normal.  Musculoskeletal: He exhibits no edema.       Right elbow: He exhibits normal range of motion, no swelling, no effusion and no deformity. Tenderness found. Medial epicondyle tenderness noted.       Right hand: He exhibits swelling. Normal sensation noted. Normal strength noted.       Hands:      Feet:  Neurological: He is alert.  Skin: Skin is warm and dry. No rash noted. No erythema.  Psychiatric: He has a normal mood and affect.   Lab Results  Component Value Date   TSH 0.89 01/04/2017   Lab Results  Component Value Date   HGBA1C 5.80 09/27/2015   Lab Results  Component Value Date   HGBA1C 6.1 03/26/2017     Assessment & Plan:  [name] [name] was seen today for tingling and follow-up.  Diagnoses and all orders for this visit:  Tingling -     HgB A1c  Generalized anxiety disorder -     escitalopram (LEXAPRO) 20 MG tablet; Take 1 tablet (20 mg total) by mouth daily.  Onychomycosis of toenail -     terbinafine (LAMISIL) 250 MG tablet; Take 1 tablet (250 mg total) by mouth daily.   There are no diagnoses linked to this encounter.  No orders of the defined types were placed in this encounter.   Follow-up: Return in about 3 months  (around 06/25/2017) for A1c check.   [name] Nearing MD

## 2017-03-26 NOTE — Addendum Note (Signed)
Addended by: Gomez Cleverly on: 03/26/2017 03:40 PM   Modules accepted: Orders

## 2017-03-28 MED FILL — TERBINAFINE HCL 250 MG TAB: 250 | 30 days supply | Qty: 30 | Fill #0

## 2017-04-02 ENCOUNTER — Telehealth: Payer: Self-pay | Admitting: Family Medicine

## 2017-04-02 ENCOUNTER — Emergency Department (HOSPITAL_COMMUNITY)
Admission: EM | Admit: 2017-04-02 | Discharge: 2017-04-03 | Disposition: A | Discharge status: Home / Self Care | Payer: Self-pay | Attending: Emergency Medicine | Admitting: Emergency Medicine

## 2017-04-02 ENCOUNTER — Encounter (HOSPITAL_COMMUNITY): Payer: Self-pay

## 2017-04-02 DIAGNOSIS — Z87891 Personal history of nicotine dependence: Secondary | ICD-10-CM | POA: Insufficient documentation

## 2017-04-02 DIAGNOSIS — F1010 Alcohol abuse, uncomplicated: Secondary | ICD-10-CM | POA: Insufficient documentation

## 2017-04-02 LAB — CBC
HCT: 39.6 % (ref 39.0–52.0)
Hemoglobin: 12.8 g/dL — ABNORMAL LOW (ref 13.0–17.0)
MCH: 24.9 pg — ABNORMAL LOW (ref 26.0–34.0)
MCHC: 32.3 g/dL (ref 30.0–36.0)
MCV: 77 fL — ABNORMAL LOW (ref 78.0–100.0)
Platelets: 244 10*3/uL (ref 150–400)
RBC: 5.14 MIL/uL (ref 4.22–5.81)
RDW: 15.5 % (ref 11.5–15.5)
WBC: 6.1 10*3/uL (ref 4.0–10.5)

## 2017-04-02 LAB — URINALYSIS, ROUTINE W REFLEX MICROSCOPIC
Bilirubin Urine: NEGATIVE
Glucose, UA: NEGATIVE mg/dL
Hgb urine dipstick: NEGATIVE
Ketones, ur: NEGATIVE mg/dL
Leukocytes, UA: NEGATIVE
Nitrite: NEGATIVE
Protein, ur: NEGATIVE mg/dL
Specific Gravity, Urine: 1.011 (ref 1.005–1.030)
pH: 5 (ref 5.0–8.0)

## 2017-04-02 LAB — COMPREHENSIVE METABOLIC PANEL
ALT: 21 U/L (ref 17–63)
AST: 30 U/L (ref 15–41)
Albumin: 4.1 g/dL (ref 3.5–5.0)
Alkaline Phosphatase: 43 U/L (ref 38–126)
Anion gap: 9 (ref 5–15)
BUN: 6 mg/dL (ref 6–20)
CO2: 23 mmol/L (ref 22–32)
Calcium: 9.1 mg/dL (ref 8.9–10.3)
Chloride: 100 mmol/L — ABNORMAL LOW (ref 101–111)
Creatinine, Ser: 0.98 mg/dL (ref 0.61–1.24)
GFR calc Af Amer: >60 mL/min (ref >60)
GFR calc non Af Amer: >60 mL/min (ref >60)
Glucose, Bld: 98 mg/dL (ref 65–99)
Potassium: 3.8 mmol/L (ref 3.5–5.1)
Sodium: 132 mmol/L — ABNORMAL LOW (ref 135–145)
Total Bilirubin: 0.9 mg/dL (ref 0.3–1.2)
Total Protein: 7.6 g/dL (ref 6.5–8.1)

## 2017-04-02 LAB — LIPASE, BLOOD: Lipase: 40 U/L (ref 11–51)

## 2017-04-02 MED ORDER — CHLORDIAZEPOXIDE HCL 25 MG PO CAPS: 25.0000 mg | ORAL_CAPSULE | Freq: Once | ORAL | Status: DC

## 2017-04-02 MED ORDER — CHLORDIAZEPOXIDE HCL 25 MG PO CAPS
50.0000 mg | ORAL_CAPSULE | Freq: Once | ORAL | Status: AC
  Administered 2017-04-02: 50 mg via ORAL
  Filled 2017-04-02: qty 2

## 2017-04-02 MED ORDER — CHLORDIAZEPOXIDE HCL 25 MG PO CAPS: ORAL_CAPSULE | ORAL | 0 refills | Status: DC

## 2017-04-02 MED FILL — ?ESCITALOPRAM 20 MG TABLET: 20 | 30 days supply | Qty: 30 | Fill #0

## 2017-04-02 NOTE — ED Provider Notes (Signed)
[facility] DEPT Provider Note   CSN: [id number] Arrival date & time: 04/02/17  1552     History   Chief Complaint Chief Complaint  Patient presents with  . Drug / Alcohol Assessment    HPI [name] [name] is a 56 y.o. male.  This a 56 year old male who is an alcoholic.  He was sober for 7 months when 3 weeks ago he started drinking again.  He states he's had very little to eat in the last several days.  He drinks 3-4, 40 ounce beers daily.  He did call his 57 office today to get a re-prescription of Librium which has helped him in the past.  They stated he needed an office visit before could be represcribed.  Patient states that he wishes to get off alcohol.  Again, but he can't do it on his own.  He does attend AA meetings, has recently become homeless, which precipitated his addiction.  Denies SI or HI.      Past Medical History:  Diagnosis Date  . Alcohol abuse    Last Drink on Sep 2015  . Anxiety    About 56 years of age  . Carpal tunnel syndrome of right wrist Dx 2015  . Depression    At 56 years of age  . GERD (gastroesophageal reflux disease) Dx 2007  . Hepatitis, alcoholic, acute 2774  . Hyperlipidemia    borderline, diet controlled  . Pancreatitis Dx 2014  . Pneumonia 11/2014  . 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 Reflux Dx 2007  . 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 Lateral epicondylitis of right elbow 01/05/2017  . Strain, MCP, hand, right, initial encounter 01/05/2017  . 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 Bilateral knee pain 01/11/2016  . Fatigue 09/27/2015  . Chronic pain of right wrist 07/07/2015  . Carpal tunnel syndrome of right wrist 05/20/2015  . Dislocation of right shoulder joint 05/20/2015  . Tear of medial meniscus of right knee 03/24/2015  . 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 Chronic cough 01/15/2015  . Onychomycosis of toenail 12/15/2014  . Seborrhea  11/11/2014  . GERD (gastroesophageal reflux disease)   . Alcohol abuse 01/19/2014  . Generalized anxiety disorder 09/02/2013    Past Surgical History:  Procedure Laterality Date  . carpel tunnel release Right 07/28/2014    done in [place]   . ELBOW SURGERY    . KNEE ARTHROSCOPY    . KNEE SURGERY Left    15 years ago  . LIPOMA EXCISION N/A 05/10/2016   Procedure: EXCISION OF SCALP LIPOMA;  Surgeon: Johnathan Hausen, MD;  Location: [facility];  Service: General;  Laterality: N/A;       Home Medications    Prior to Admission medications   Medication Sig Start Date End Date Taking? Authorizing Provider  albuterol (PROVENTIL HFA;VENTOLIN HFA) 108 (90 Base) MCG/ACT inhaler Inhale 1-2 puffs into the lungs every 6 (six) hours as needed for wheezing or shortness of breath. 03/18/17  Yes Leaphart, Zack Seal, PA-C  escitalopram (LEXAPRO) 20 MG tablet Take 1 tablet (20 mg total) by mouth daily. 03/26/17  Yes Funches, Josalyn, MD  fluticasone (FLONASE) 50 MCG/ACT nasal spray Place 2 sprays into both nostrils daily. 11/09/16  Yes Funches, Josalyn, MD  albuterol (PROVENTIL HFA;VENTOLIN HFA) 108 (90 Base) MCG/ACT inhaler Inhale 2 puffs into the lungs every 4 (four) hours as needed for wheezing or shortness of breath. Patient  not taking: Reported on 04/02/2017 03/01/17   Janne Napoleon, NP  chlordiazePOXIDE (LIBRIUM) 25 MG capsule 50mg  PO TID x 1D, then 25-50mg  PO BID X 1D, then 25-50mg  PO QD X 1D 04/02/17   Junius Creamer, NP  fluticasone [facility]) 50 MCG/ACT nasal spray Place 2 sprays into both nostrils daily. Patient not taking: Reported on 04/02/2017 03/18/17   Ocie Cornfield T, PA-C  ipratropium (ATROVENT) 0.06 % nasal spray Place 2 sprays into both nostrils 4 (four) times daily. Patient not taking: Reported on 03/26/2017 03/01/17   Janne Napoleon, NP  predniSONE (DELTASONE) 20 MG tablet Take 2 tablets (40 mg total) by mouth daily with breakfast. Patient not taking: Reported on 04/02/2017 03/18/17    Ocie Cornfield T, PA-C  terbinafine (LAMISIL) 250 MG tablet Take 1 tablet (250 mg total) by mouth daily. 03/26/17   Funches, Adriana Mccallum, MD  triamcinolone cream (KENALOG) 0.1 % Apply 1 application topically 2 (two) times daily. Patient not taking: Reported on 03/26/2017 03/01/17   Janne Napoleon, NP  Turmeric 500 MG CAPS Take 500 mg by mouth daily. Patient not taking: Reported on 03/26/2017 01/04/17   Boykin Nearing, MD    Family History Family History  Problem Relation Age of Onset  . Diabetes Father   . Hypertension Father   . Hypertension Paternal Grandfather   . Alcohol abuse Paternal Grandfather   . Alcohol abuse Maternal Grandfather   . Alcohol abuse Maternal Grandmother   . Alcohol abuse Paternal Grandmother   . Colon cancer Neg Hx   . Rectal cancer Neg Hx   . Stomach cancer Neg Hx     Social History Social History  Substance Use Topics  . Smoking status: Former Smoker    Types: Cigars  . Smokeless tobacco: Never Used     Comment: smokes occasional black &mild  . Alcohol use No     Comment: rehab     Allergies   Oxycodone   Review of Systems Review of Systems  Constitutional: Negative for fever.  Respiratory: Negative for shortness of breath.   Cardiovascular: Negative for chest pain.  Gastrointestinal: Negative for abdominal pain, nausea and vomiting.  Skin: Negative for rash and wound.  Neurological: Negative for dizziness, weakness and numbness.  All other systems reviewed and are negative.    Physical Exam Updated Vital Signs BP 112/82   Pulse 76   Temp 98 F (36.7 C) (Oral)   Resp 17   Ht 5\' 9"  (1.753 m)   Wt 67.1 kg   SpO2 99%   BMI 21.86 kg/m   Physical Exam  Constitutional: He appears well-developed and well-nourished. No distress.  HENT:  Head: Normocephalic.  Eyes: Pupils are equal, round, and reactive to light.  Neck: Normal range of motion.  Cardiovascular: Normal rate.   Pulmonary/Chest: Effort normal.  Abdominal: Soft.    Musculoskeletal: Normal range of motion.  Neurological: He is alert.  Skin: Skin is warm.  Psychiatric: He has a normal mood and affect. His speech is normal and behavior is normal. Judgment and thought content normal. Cognition and memory are normal.  Nursing note and vitals reviewed.    ED Treatments / Results  Labs (all labs ordered are listed, but only abnormal results are displayed) Labs Reviewed  COMPREHENSIVE METABOLIC PANEL - Abnormal; Notable for the following:       Result Value   Sodium 132 (*)    Chloride 100 (*)    All other components within normal limits  CBC - Abnormal; Notable for  the following:    Hemoglobin 12.8 (*)    MCV 77.0 (*)    MCH 24.9 (*)    All other components within normal limits  LIPASE, BLOOD  URINALYSIS, ROUTINE W REFLEX MICROSCOPIC    EKG  EKG Interpretation None       Radiology No results found.  Procedures Procedures (including critical care time)  Medications Ordered in ED Medications  chlordiazePOXIDE (LIBRIUM) capsule 50 mg (50 mg Oral Given 04/02/17 2338)     Initial Impression / Assessment and Plan / ED Course  I have reviewed the triage vital signs and the nursing notes.  Pertinent labs & imaging results that were available during my care of the patient were reviewed by me and considered in my medical decision making (see chart for details).      Labs every reviewed within normal parameters.  Sodium is slightly low at 132.  Patient has been given a meal.  Social work is at bedside for possible to home in a shelter  Final Clinical Impressions(s) / ED Diagnoses   Final diagnoses:  Alcohol abuse    New Prescriptions Discharge Medication List as of 04/02/2017 11:26 PM    START taking these medications   Details  chlordiazePOXIDE (LIBRIUM) 25 MG capsule 50mg  PO TID x 1D, then 25-50mg  PO BID X 1D, then 25-50mg  PO QD X 1D, Print         Junius Creamer, NP 04/03/17 2000    Junius Creamer, NP 04/03/17 2008     Merrily Pew, MD 04/04/17 1451

## 2017-04-02 NOTE — Progress Notes (Signed)
CSW provided pt with shelter list, buss passes and free food/meals booklets.  Pt to f/u with [facility] in the am, as they had no beds this pm.  Pt will wait in the lobby until the busses start running in the morning.  Lennette Bihari, RN at [facility] informed.

## 2017-04-02 NOTE — Telephone Encounter (Signed)
Will forward request to Dr. Adrian Blackwater

## 2017-04-02 NOTE — Telephone Encounter (Signed)
Pt. Called requesting a refill on chlordiazePOXIDE (LIBRIUM).  Pt. States he used to take b/c he used to drink alcohol.  Pt. States he kind of starting drinking again. Please f/u with pt.

## 2017-04-02 NOTE — Telephone Encounter (Signed)
Patient informed that he needs an OV

## 2017-04-02 NOTE — ED Triage Notes (Addendum)
Pt reports hx of alcohol abuse and is here requesting detox. Last drink today "two 40s." and states for the last 4 days he has not eaten anything, has only been drinking. 'I drink four 40's a day, starting in the morning. Denies Si/Hi. Pt complains of feeling very nauseous.

## 2017-04-02 NOTE — Discharge Instructions (Signed)
You've been given a prescription for Librium per your request.  Please try to avoid alcohol given, given a resource list for community resources.  Follow-up with your primary care physician as well

## 2017-04-02 NOTE — ED Notes (Signed)
Pt given specimen cup. 

## 2017-04-04 MED FILL — CHLORDIAZEPOXIDE 25 MG CAP: 25 | 3 days supply | Qty: 10 | Fill #0

## 2017-04-11 ENCOUNTER — Encounter: Payer: Self-pay | Admitting: Family Medicine

## 2017-04-25 ENCOUNTER — Emergency Department (HOSPITAL_COMMUNITY): Payer: Self-pay

## 2017-04-25 ENCOUNTER — Encounter (HOSPITAL_COMMUNITY): Payer: Self-pay | Admitting: Emergency Medicine

## 2017-04-25 DIAGNOSIS — R202 Paresthesia of skin: Secondary | ICD-10-CM | POA: Insufficient documentation

## 2017-04-25 DIAGNOSIS — F1010 Alcohol abuse, uncomplicated: Secondary | ICD-10-CM | POA: Insufficient documentation

## 2017-04-25 DIAGNOSIS — Z5181 Encounter for therapeutic drug level monitoring: Secondary | ICD-10-CM | POA: Insufficient documentation

## 2017-04-25 DIAGNOSIS — Z79899 Other long term (current) drug therapy: Secondary | ICD-10-CM | POA: Insufficient documentation

## 2017-04-25 DIAGNOSIS — Z87891 Personal history of nicotine dependence: Secondary | ICD-10-CM | POA: Insufficient documentation

## 2017-04-25 LAB — CBC
HCT: 39.8 % (ref 39.0–52.0)
Hemoglobin: 12.7 g/dL — ABNORMAL LOW (ref 13.0–17.0)
MCH: 25.9 pg — ABNORMAL LOW (ref 26.0–34.0)
MCHC: 31.9 g/dL (ref 30.0–36.0)
MCV: 81.1 fL (ref 78.0–100.0)
Platelets: 206 10*3/uL (ref 150–400)
RBC: 4.91 MIL/uL (ref 4.22–5.81)
RDW: 16.9 % — ABNORMAL HIGH (ref 11.5–15.5)
WBC: 5.4 10*3/uL (ref 4.0–10.5)

## 2017-04-25 LAB — I-STAT CHEM 8, ED
BUN: 17 mg/dL (ref 6–20)
Calcium, Ion: 1.07 mmol/L — ABNORMAL LOW (ref 1.15–1.40)
Chloride: 98 mmol/L — ABNORMAL LOW (ref 101–111)
Creatinine, Ser: 1.4 mg/dL — ABNORMAL HIGH (ref 0.61–1.24)
Glucose, Bld: 80 mg/dL (ref 65–99)
HCT: 42 % (ref 39.0–52.0)
Hemoglobin: 14.3 g/dL (ref 13.0–17.0)
Potassium: 4 mmol/L (ref 3.5–5.1)
Sodium: 136 mmol/L (ref 135–145)
TCO2: 28 mmol/L (ref 0–100)

## 2017-04-25 LAB — DIFFERENTIAL
Basophils Absolute: 0 10*3/uL (ref 0.0–0.1)
Basophils Relative: 0 %
Eosinophils Absolute: 0.1 10*3/uL (ref 0.0–0.7)
Eosinophils Relative: 1 %
Lymphocytes Relative: 42 %
Lymphs Abs: 2.3 10*3/uL (ref 0.7–4.0)
Monocytes Absolute: 0.5 10*3/uL (ref 0.1–1.0)
Monocytes Relative: 9 %
Neutro Abs: 2.6 10*3/uL (ref 1.7–7.7)
Neutrophils Relative %: 48 %

## 2017-04-25 LAB — I-STAT TROPONIN, ED: Troponin i, poc: 0 ng/mL (ref 0.00–0.08)

## 2017-04-25 LAB — PROTIME-INR
INR: 0.96
Prothrombin Time: 12.7 seconds (ref 11.4–15.2)

## 2017-04-25 LAB — APTT: aPTT: 33 seconds (ref 24–36)

## 2017-04-25 NOTE — ED Triage Notes (Signed)
Pt in reporting L facial numbness and bilateral feet numbness X3-4 days. Pt reports CP as well. States numbness started when he tried to detox himself from ETOH. Pt endorses drinking 4 40's today. A/OX4.

## 2017-04-26 ENCOUNTER — Emergency Department (HOSPITAL_COMMUNITY)
Admission: EM | Admit: 2017-04-26 | Discharge: 2017-04-26 | Disposition: A | Discharge status: Home / Self Care | Payer: Self-pay | Attending: Emergency Medicine | Admitting: Emergency Medicine

## 2017-04-26 ENCOUNTER — Emergency Department (HOSPITAL_COMMUNITY): Payer: Self-pay

## 2017-04-26 DIAGNOSIS — R202 Paresthesia of skin: Secondary | ICD-10-CM

## 2017-04-26 DIAGNOSIS — F1010 Alcohol abuse, uncomplicated: Secondary | ICD-10-CM

## 2017-04-26 LAB — COMPREHENSIVE METABOLIC PANEL
ALT: 31 U/L (ref 17–63)
AST: 34 U/L (ref 15–41)
Albumin: 4.3 g/dL (ref 3.5–5.0)
Alkaline Phosphatase: 38 U/L (ref 38–126)
Anion gap: 10 (ref 5–15)
BUN: 14 mg/dL (ref 6–20)
CO2: 25 mmol/L (ref 22–32)
Calcium: 9.1 mg/dL (ref 8.9–10.3)
Chloride: 99 mmol/L — ABNORMAL LOW (ref 101–111)
Creatinine, Ser: 1.07 mg/dL (ref 0.61–1.24)
GFR calc Af Amer: >60 mL/min (ref >60)
GFR calc non Af Amer: >60 mL/min (ref >60)
Glucose, Bld: 85 mg/dL (ref 65–99)
Potassium: 4.1 mmol/L (ref 3.5–5.1)
Sodium: 134 mmol/L — ABNORMAL LOW (ref 135–145)
Total Bilirubin: 0.7 mg/dL (ref 0.3–1.2)
Total Protein: 7.8 g/dL (ref 6.5–8.1)

## 2017-04-26 LAB — ETHANOL: Alcohol, Ethyl (B): 253 mg/dL — ABNORMAL HIGH (ref <5)

## 2017-04-26 MED ORDER — LORAZEPAM 2 MG/ML IJ SOLN
1.0000 mg | Freq: Once | INTRAMUSCULAR | Status: AC
  Administered 2017-04-26: 1 mg via INTRAVENOUS
  Filled 2017-04-26: qty 1

## 2017-04-26 MED ORDER — SODIUM CHLORIDE 0.9 % IV BOLUS (SEPSIS)
1000.0000 mL | Freq: Once | INTRAVENOUS | Status: AC
  Administered 2017-04-26: 1000 mL via INTRAVENOUS

## 2017-04-26 MED ORDER — VITAMIN B-1 100 MG PO TABS: 100.0000 mg | ORAL_TABLET | Freq: Every day | ORAL | 0 refills | Status: DC

## 2017-04-26 MED ORDER — FOLIC ACID 1 MG PO TABS: 1.0000 mg | ORAL_TABLET | Freq: Every day | ORAL | 0 refills | Status: DC

## 2017-04-26 MED ORDER — VITAMIN B-1 100 MG PO TABS
100.0000 mg | ORAL_TABLET | Freq: Once | ORAL | Status: AC
  Administered 2017-04-26: 100 mg via ORAL
  Filled 2017-04-26: qty 1

## 2017-04-26 MED ORDER — CARBAMAZEPINE 200 MG PO TABS: ORAL_TABLET | ORAL | 0 refills | Status: DC

## 2017-04-26 MED ORDER — FOLIC ACID 1 MG PO TABS: 1.0000 mg | ORAL_TABLET | Freq: Once | ORAL | Status: DC

## 2017-04-26 NOTE — Discharge Instructions (Signed)
Please use resources provided to seek help with your alcohol abuse.  Take medications prescribed to help with detox and to help replenish your electrolytes and help with your tingling sensations.

## 2017-04-26 NOTE — ED Notes (Signed)
Report given to Anna RN, next shift  

## 2017-04-26 NOTE — ED Notes (Signed)
Patient transported to MRI 

## 2017-04-26 NOTE — ED Provider Notes (Signed)
[facility] DEPT Provider Note   CSN: [id number] Arrival date & time: 04/25/17  2244     History   Chief Complaint Chief Complaint  Patient presents with  . Numbness  . Alcohol Problem    HPI [name] [name] is a 56 y.o. male.  HPI   56 year old male with history of alcohol abuse, GERD, hyperlipidemia, alcohol-induced pancreatitis presenting for evaluation ofLeft facial numbness. Patient report persistent tingling and decreased sensation to left side of his face on for the past week. Initially he was intermittent and now has been persistent. Furthermore he also reports to sensation to bilateral toes which is chronic in nature's. Report having epigastric burning sensation to his upper abdomen and chest which he treated it to gas and reflux. Furthermore patient states he recently relapsed on alcohol use started drinking heavily again within the past month. He has had alcohol treatment at the [facility] in the past. He is requesting for outpatient resource to help with his alcohol abuse. His last alcohol use was yesterday, admits to drinking 3 40oz of alcohol yesterday. He is not a smoker. Starting new job as Sports coach. No prior history of stroke. States that he hasn't been eating well lately. Report occasional loose stools but no vomiting, nausea.    Past Medical History:  Diagnosis Date  . Alcohol abuse    Last Drink on Sep 2015  . Anxiety    About 56 years of age  . Carpal tunnel syndrome of right wrist Dx 2015  . Depression    At 56 years of age  . GERD (gastroesophageal reflux disease) Dx 2007  . Hepatitis, alcoholic, acute 5993  . Hyperlipidemia    borderline, diet controlled  . Pancreatitis Dx 2014  . Pneumonia 11/2014  . 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 Reflux Dx 2007  . 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 Lateral epicondylitis of right elbow 01/05/2017  . Strain, MCP, hand, right,  initial encounter 01/05/2017  . 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 Bilateral knee pain 01/11/2016  . Fatigue 09/27/2015  . Chronic pain of right wrist 07/07/2015  . Carpal tunnel syndrome of right wrist 05/20/2015  . Dislocation of right shoulder joint 05/20/2015  . Tear of medial meniscus of right knee 03/24/2015  . 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 Chronic cough 01/15/2015  . Onychomycosis of toenail 12/15/2014  . Seborrhea 11/11/2014  . GERD (gastroesophageal reflux disease)   . Alcohol abuse 01/19/2014  . Generalized anxiety disorder 09/02/2013    Past Surgical History:  Procedure Laterality Date  . carpel tunnel release Right 07/28/2014    done in [place]   . ELBOW SURGERY    . KNEE ARTHROSCOPY    . KNEE SURGERY Left    15 years ago  . LIPOMA EXCISION N/A 05/10/2016   Procedure: EXCISION OF SCALP LIPOMA;  Surgeon: Johnathan Hausen, MD;  Location: [facility];  Service: General;  Laterality: N/A;       Home Medications    Prior to Admission medications   Medication Sig Start Date End Date Taking? Authorizing Provider  albuterol (PROVENTIL HFA;VENTOLIN HFA) 108 (90 Base) MCG/ACT inhaler Inhale 2 puffs into the lungs every 4 (four) hours as needed for wheezing or shortness of breath. Patient not taking: Reported on 04/02/2017 03/01/17   Janne Napoleon, NP  albuterol (PROVENTIL HFA;VENTOLIN HFA) 108 (90 Base) MCG/ACT inhaler Inhale 1-2 puffs into the lungs every  6 (six) hours as needed for wheezing or shortness of breath. 03/18/17   Leaphart, Zack Seal, PA-C  chlordiazePOXIDE (LIBRIUM) 25 MG capsule 50mg  PO TID x 1D, then 25-50mg  PO BID X 1D, then 25-50mg  PO QD X 1D 04/02/17   Junius Creamer, NP  escitalopram (LEXAPRO) 20 MG tablet Take 1 tablet (20 mg total) by mouth daily. 03/26/17   Funches, Adriana Mccallum, MD  fluticasone (FLONASE) 50 MCG/ACT nasal spray Place 2 sprays into both nostrils daily. 11/09/16   Funches, Adriana Mccallum, MD  fluticasone (FLONASE) 50 MCG/ACT nasal spray  Place 2 sprays into both nostrils daily. Patient not taking: Reported on 04/02/2017 03/18/17   Ocie Cornfield T, PA-C  ipratropium (ATROVENT) 0.06 % nasal spray Place 2 sprays into both nostrils 4 (four) times daily. Patient not taking: Reported on 03/26/2017 03/01/17   Janne Napoleon, NP  predniSONE (DELTASONE) 20 MG tablet Take 2 tablets (40 mg total) by mouth daily with breakfast. Patient not taking: Reported on 04/02/2017 03/18/17   Ocie Cornfield T, PA-C  terbinafine (LAMISIL) 250 MG tablet Take 1 tablet (250 mg total) by mouth daily. 03/26/17   Funches, Adriana Mccallum, MD  triamcinolone cream (KENALOG) 0.1 % Apply 1 application topically 2 (two) times daily. Patient not taking: Reported on 03/26/2017 03/01/17   Janne Napoleon, NP  Turmeric 500 MG CAPS Take 500 mg by mouth daily. Patient not taking: Reported on 03/26/2017 01/04/17   Boykin Nearing, MD    Family History Family History  Problem Relation Age of Onset  . Diabetes Father   . Hypertension Father   . Hypertension Paternal Grandfather   . Alcohol abuse Paternal Grandfather   . Alcohol abuse Maternal Grandfather   . Alcohol abuse Maternal Grandmother   . Alcohol abuse Paternal Grandmother   . Colon cancer Neg Hx   . Rectal cancer Neg Hx   . Stomach cancer Neg Hx     Social History Social History  Substance Use Topics  . Smoking status: Former Smoker    Types: Cigars  . Smokeless tobacco: Never Used     Comment: smokes occasional black &mild  . Alcohol use No     Comment: rehab     Allergies   Oxycodone   Review of Systems Review of Systems  All other systems reviewed and are negative.    Physical Exam Updated Vital Signs BP 116/86 (BP Location: Right Arm)   Pulse 78   Temp 98 F (36.7 C) (Oral)   Resp 17   Ht 5\' 9"  (1.753 m)   Wt 68 kg (150 lb)   SpO2 100%   BMI 22.15 kg/m   Physical Exam  Constitutional: He is oriented to person, place, and time. He appears well-developed and well-nourished. No distress.    HENT:  Head: Atraumatic.  Mouth/Throat: Oropharynx is clear and moist.  Eyes: Conjunctivae and EOM are normal.  Neck: Normal range of motion. Neck supple. No JVD present.  Cardiovascular: Normal rate, regular rhythm and intact distal pulses.   Pulmonary/Chest: Effort normal and breath sounds normal. No respiratory distress. He has no wheezes. He has no rales. He exhibits no tenderness.  Abdominal: Soft. Bowel sounds are normal. He exhibits no distension. There is no tenderness.  Musculoskeletal: Normal range of motion.  Neurological: He is alert and oriented to person, place, and time.  Neurologic exam:  Speech clear, pupils equal round reactive to light, extraocular movements intact  Normal peripheral visual fields Cranial nerves III through XII normal including no facial droop.  Subjective  mildly decreased sensation to L side of face with light touch. Follows commands, moves all extremities x4, normal strength to bilateral upper and lower extremities at all major muscle groups including grip Coordination intact, no limb ataxia, finger-nose-finger normal Rapid alternating movements normal No pronator drift Gait not tested   Skin: No rash noted.  Psychiatric: He has a normal mood and affect.  Nursing note and vitals reviewed.    ED Treatments / Results  Labs (all labs ordered are listed, but only abnormal results are displayed) Labs Reviewed  CBC - Abnormal; Notable for the following:       Result Value   Hemoglobin 12.7 (*)    MCH 25.9 (*)    RDW 16.9 (*)    All other components within normal limits  COMPREHENSIVE METABOLIC PANEL - Abnormal; Notable for the following:    Sodium 134 (*)    Chloride 99 (*)    All other components within normal limits  ETHANOL - Abnormal; Notable for the following:    Alcohol, Ethyl (B) 253 (*)    All other components within normal limits  I-STAT CHEM 8, ED - Abnormal; Notable for the following:    Chloride 98 (*)    Creatinine, Ser 1.40  (*)    Calcium, Ion 1.07 (*)    All other components within normal limits  PROTIME-INR  APTT  DIFFERENTIAL  I-STAT TROPOININ, ED  I-STAT TROPOININ, ED    EKG  EKG Interpretation  Date/Time:  Wednesday Apr 25 2017 22:53:38 EDT Ventricular Rate:  80 PR Interval:  148 QRS Duration: 88 QT Interval:  340 QTC Calculation: 392 R Axis:   70 Text Interpretation:  Normal sinus rhythm Moderate voltage criteria for LVH, may be normal variant Early repolarization Borderline ECG Confirmed by Fredia Sorrow [phone]) on 04/26/2017 11:08:09 AM       Radiology Ct Head Wo Contrast  Result Date: 04/25/2017 CLINICAL DATA:  Left-sided facial numbness, history of alcohol abuse and alcoholic hepatitis. EXAM: CT HEAD WITHOUT CONTRAST TECHNIQUE: Contiguous axial images were obtained from the base of the skull through the vertex without intravenous contrast. COMPARISON:  None. FINDINGS: Brain: No evidence of acute infarction, hemorrhage, hydrocephalus, extra-axial collection or mass lesion/mass effect. Vascular: No hyperdense vessel or unexpected calcification. Skull: Normal. Negative for fracture or focal lesion. Sinuses/Orbits: No acute finding. Other: None. IMPRESSION: No acute intracranial abnormality. Electronically Signed   By: Ashley Royalty M.D.   On: 04/25/2017 23:45    Procedures Procedures (including critical care time)  Medications Ordered in ED Medications  folic acid (FOLVITE) tablet 1 mg (not administered)  sodium chloride 0.9 % bolus 1,000 mL (0 mLs Intravenous Stopped 04/26/17 0954)  thiamine (VITAMIN B-1) tablet 100 mg (100 mg Oral Given 04/26/17 0905)  LORazepam (ATIVAN) injection 1 mg (1 mg Intravenous Given 04/26/17 1002)     Initial Impression / Assessment and Plan / ED Course  I have reviewed the triage vital signs and the nursing notes.  Pertinent labs & imaging results that were available during my care of the patient were reviewed by me and considered in my medical decision  making (see chart for details).     BP (!) 141/93   Pulse 76   Temp 98.3 F (36.8 C)   Resp 15   Ht 5\' 9"  (1.753 m)   Wt 68 kg (150 lb)   SpO2 98%   BMI 22.15 kg/m    Final Clinical Impressions(s) / ED Diagnoses   Final diagnoses:  Alcohol  abuse  Paresthesia    New Prescriptions New Prescriptions   CARBAMAZEPINE (TEGRETOL) 200 MG TABLET    800mg  PO QD X 1D, then 600mg  PO QD X 1D, then 400mg  QD X 1D, then 200mg  PO QD X 2D   FOLIC ACID (FOLVITE) 1 MG TABLET    Take 1 tablet (1 mg total) by mouth daily.   THIAMINE (VITAMIN B-1) 100 MG TABLET    Take 1 tablet (100 mg total) by mouth daily.   7:17 AM Patient with history of alcohol abuse here with complaints of subjective left-sided facial paresthesia and tingling sensation to bilateral feet. Aside from subjective decreased sensation to the affected area, he has no focal neuro deficit. He does abuse alcohol which increased risk of electrolytes derangement including thiamine, folic acid. Labs remarkable for mild history of present illness with creatinine of 1.4, IV fluid given. Will also give thiamine and folic acid supplementation. Alcohol level is 253. Head CT scan without any acute abnormalities. After discussed with Dr. Billy Fischer, plan to obtain brain MRI to rule out stroke.  12:11 PM Brain MRI normal.  Pt felt reassured.   Pt d/c with medication to help with withdrawal symptoms.  Outpt resources provided.  Return precaution discussed.     Domenic Moras, PA-C 04/26/17 1221    Gareth Morgan, MD 04/30/17 [phone]

## 2017-06-01 ENCOUNTER — Telehealth: Payer: Self-pay | Admitting: Family Medicine

## 2017-06-01 NOTE — Telephone Encounter (Signed)
/  Medication was filled by Junius Creamer on 04/02/17. Is this a medication that you will fill for this patient.

## 2017-06-01 NOTE — Telephone Encounter (Signed)
Pt. called reuquesting a refill on chlordiazePOXIDE (LIBRIUM) 25 MG capsule  Pt. Was told that the medication was not prescribed by his PCP. Pt. States that he Needs the medication urgently. Please f/u with pt.

## 2017-06-01 NOTE — Telephone Encounter (Signed)
Patient will need follow up appointment as librium is a controlled substance

## 2017-06-01 NOTE — Telephone Encounter (Signed)
Pt was called and informed of office visit needed for refill of medication.

## 2017-06-18 ENCOUNTER — Ambulatory Visit: Payer: Self-pay | Admitting: Licensed Clinical Social Worker

## 2017-06-18 ENCOUNTER — Encounter: Payer: Self-pay | Admitting: Family Medicine

## 2017-06-18 ENCOUNTER — Ambulatory Visit: Payer: Self-pay | Attending: Family Medicine | Admitting: Family Medicine

## 2017-06-18 DIAGNOSIS — F411 Generalized anxiety disorder: Secondary | ICD-10-CM

## 2017-06-18 DIAGNOSIS — Z87891 Personal history of nicotine dependence: Secondary | ICD-10-CM | POA: Insufficient documentation

## 2017-06-18 DIAGNOSIS — F1010 Alcohol abuse, uncomplicated: Secondary | ICD-10-CM

## 2017-06-18 DIAGNOSIS — Z590 Homelessness: Secondary | ICD-10-CM | POA: Insufficient documentation

## 2017-06-18 MED ORDER — BUSPIRONE HCL 5 MG PO TABS: 5.0000 mg | ORAL_TABLET | Freq: Two times a day (BID) | ORAL | 2 refills | Status: DC

## 2017-06-18 MED ORDER — VITAMIN B-1 100 MG PO TABS: 100.0000 mg | ORAL_TABLET | Freq: Every day | ORAL | 5 refills | Status: DC

## 2017-06-18 MED ORDER — ESCITALOPRAM OXALATE 20 MG PO TABS: 20.0000 mg | ORAL_TABLET | Freq: Every day | ORAL | 5 refills | Status: DC

## 2017-06-18 MED ORDER — CHLORDIAZEPOXIDE HCL 25 MG PO CAPS: ORAL_CAPSULE | ORAL | 0 refills | Status: DC

## 2017-06-18 MED ORDER — FOLIC ACID 1 MG PO TABS: 1.0000 mg | ORAL_TABLET | Freq: Every day | ORAL | 5 refills | Status: DC

## 2017-06-18 MED ORDER — PANTOPRAZOLE SODIUM 40 MG PO TBEC: 40.0000 mg | DELAYED_RELEASE_TABLET | Freq: Every day | ORAL | 3 refills | Status: DC

## 2017-06-18 NOTE — Patient Instructions (Addendum)
[name] [name] was seen today for anxiety and alcohol problem.  Diagnoses and all orders for this visit:  Generalized anxiety disorder -     escitalopram (LEXAPRO) 20 MG tablet; Take 1 tablet (20 mg total) by mouth daily. -     busPIRone (BUSPAR) 5 MG tablet; Take 1 tablet (5 mg total) by mouth 2 (two) times daily.  Alcohol abuse -     pantoprazole (PROTONIX) 40 MG tablet; Take 1 tablet (40 mg total) by mouth daily. -     thiamine (VITAMIN B-1) 100 MG tablet; Take 1 tablet (100 mg total) by mouth daily. -     folic acid (FOLVITE) 1 MG tablet; Take 1 tablet (1 mg total) by mouth daily. -     chlordiazePOXIDE (LIBRIUM) 25 MG capsule; Take by mouth 50 mg three times a day for 3 days, then 50 mg twice daily for 3 days, then 25 mg twice daily for 3 days, then 25 mg once daily for 3 days then STOP   Return 3 weeks to speak with clinical social worker  F/u in 6 weeks with provider   Dr. Adrian Blackwater

## 2017-06-18 NOTE — Progress Notes (Signed)
Subjective:  Patient ID: [name] [name], male    DOB: 1961-06-06  Age: 56 y.o. MRN: [id number]  CC: Anxiety and Alcohol Problem   HPI [name] [name] presents for    1. Alcohol abuse: he has history of alcohol abuse. he report drinking for the last 2 months everyday. He is drinking 3, 40 oz of beer in 24 hrs.  He is having stomach aches. He reports low appetite. He reports anxiety.  He he reports he left the [employer]. He was homeless. He is now living with his Aunt as of 06/15/17.  He is working. 7:30 AM-3:30 PM.  He reports a fall while intoxicated 1 week ago. He hit his right flank. No head trauma.   2. Chronic anxiety: he is taking lexapro 20 mg daily. He reports daily anxiety. He reports anxiety triggers alcohol abuse.   Social History  Substance Use Topics  . Smoking status: Former Smoker    Types: Cigars  . Smokeless tobacco: Never Used     Comment: smokes occasional black &mild  . Alcohol use No     Comment: rehab    Outpatient Medications Prior to Visit  Medication Sig Dispense Refill  . albuterol (PROVENTIL HFA;VENTOLIN HFA) 108 (90 Base) MCG/ACT inhaler Inhale 2 puffs into the lungs every 4 (four) hours as needed for wheezing or shortness of breath. (Patient not taking: Reported on 04/02/2017) 1 Inhaler 0  . albuterol (PROVENTIL HFA;VENTOLIN HFA) 108 (90 Base) MCG/ACT inhaler Inhale 1-2 puffs into the lungs every 6 (six) hours as needed for wheezing or shortness of breath. 1 Inhaler 0  . carbamazepine (TEGRETOL) 200 MG tablet 800mg  PO QD X 1D, then 600mg  PO QD X 1D, then 400mg  QD X 1D, then 200mg  PO QD X 2D 11 tablet 0  . chlordiazePOXIDE (LIBRIUM) 25 MG capsule 50mg  PO TID x 1D, then 25-50mg  PO BID X 1D, then 25-50mg  PO QD X 1D 10 capsule 0  . escitalopram (LEXAPRO) 20 MG tablet Take 1 tablet (20 mg total) by mouth daily. 30 tablet 5  . fluticasone (FLONASE) 50 MCG/ACT nasal spray Place 2 sprays into both nostrils daily. 16 g 5  . fluticasone (FLONASE) 50 MCG/ACT nasal  spray Place 2 sprays into both nostrils daily. (Patient not taking: Reported on 01/29/1936) 16 g 12  . folic acid (FOLVITE) 1 MG tablet Take 1 tablet (1 mg total) by mouth daily. 14 tablet 0  . ipratropium (ATROVENT) 0.06 % nasal spray Place 2 sprays into both nostrils 4 (four) times daily. (Patient not taking: Reported on 03/26/2017) 15 mL 12  . predniSONE (DELTASONE) 20 MG tablet Take 2 tablets (40 mg total) by mouth daily with breakfast. (Patient not taking: Reported on 04/02/2017) 6 tablet 0  . terbinafine (LAMISIL) 250 MG tablet Take 1 tablet (250 mg total) by mouth daily. 30 tablet 2  . thiamine (VITAMIN B-1) 100 MG tablet Take 1 tablet (100 mg total) by mouth daily. 20 tablet 0  . triamcinolone cream (KENALOG) 0.1 % Apply 1 application topically 2 (two) times daily. (Patient not taking: Reported on 03/26/2017) 30 g 0  . Turmeric 500 MG CAPS Take 500 mg by mouth daily. (Patient not taking: Reported on 03/26/2017)     No facility-administered medications prior to visit.     ROS Review of Systems  Constitutional: Negative for chills, fatigue, fever and unexpected weight change.  Eyes: Negative for visual disturbance.  Respiratory: Negative for cough and shortness of breath.   Cardiovascular: Negative for chest  pain, palpitations and leg swelling.  Gastrointestinal: Positive for abdominal pain. Negative for blood in stool, constipation, diarrhea, nausea and vomiting.  Endocrine: Negative for polydipsia, polyphagia and polyuria.  Genitourinary: Positive for flank pain.  Musculoskeletal: Negative for arthralgias, back pain, gait problem, myalgias and neck pain.  Skin: Negative for rash.  Allergic/Immunologic: Negative for immunocompromised state.  Hematological: Negative for adenopathy. Does not bruise/bleed easily.  Psychiatric/Behavioral: Positive for dysphoric mood. Negative for sleep disturbance and suicidal ideas. The patient is nervous/anxious.     Objective:  BP 121/78   Pulse 63    Temp 98.3 F (36.8 C) (Oral)   Ht 5\' 9"  (1.753 m)   Wt 142 lb 12.8 oz (64.8 kg)   SpO2 98%   BMI 21.09 kg/m   BP/Weight 04/26/2017 03/05/8118 12/01/7827  Systolic BP 562 - 130  Diastolic BP 99 - 82  Wt. (Lbs) - 150 -  BMI 22.15 - -  Some encounter information is confidential and restricted. Go to Review Flowsheets activity to see all data.      Physical Exam  Constitutional: He appears well-developed and well-nourished. No distress.  Thin African American male   HENT:  Head: Normocephalic and atraumatic.  Neck: Normal range of motion. Neck supple.  Cardiovascular: Normal rate, regular rhythm, normal heart sounds and intact distal pulses.   Pulmonary/Chest: Effort normal and breath sounds normal.  Abdominal: Soft. Bowel sounds are normal. He exhibits no distension and no mass. There is no tenderness. There is no rebound and no guarding.    Musculoskeletal: He exhibits no edema.  Skin: Skin is warm and dry. No rash noted. No erythema.  Psychiatric: He has a normal mood and affect.   Depression screen [facility] 2/9 06/18/2017 03/26/2017 01/04/2017  Decreased Interest 2 0 0  Down, Depressed, Hopeless 1 0 0  PHQ - 2 Score 3 0 0  Altered sleeping 1 0 0  Tired, decreased energy 1 0 1  Change in appetite 3 0 1  Feeling bad or failure about yourself  1 0 0  Trouble concentrating 1 0 1  Moving slowly or fidgety/restless 1 0 0  Suicidal thoughts 0 0 0  PHQ-9 Score 11 0 3  Some recent data might be hidden   GAD 7 : Generalized Anxiety Score 06/18/2017 03/26/2017 01/04/2017 11/09/2016  Nervous, Anxious, on Edge 3 1 1 1   Control/stop worrying 1 0 0 1  Worry too much - different things 1 0 1 2  Trouble relaxing 3 0 1 1  Restless 1 0 0 2  Easily annoyed or irritable 0 0 0 2  Afraid - awful might happen 0 0 0 1  Total GAD 7 Score 9 1 3 10       Assessment & Plan:  [name] [name] was seen today for anxiety and alcohol problem.  Diagnoses and all orders for this visit:  Generalized anxiety  disorder -     escitalopram (LEXAPRO) 20 MG tablet; Take 1 tablet (20 mg total) by mouth daily. -     busPIRone (BUSPAR) 5 MG tablet; Take 1 tablet (5 mg total) by mouth 2 (two) times daily.  Alcohol abuse -     pantoprazole (PROTONIX) 40 MG tablet; Take 1 tablet (40 mg total) by mouth daily. -     thiamine (VITAMIN B-1) 100 MG tablet; Take 1 tablet (100 mg total) by mouth daily. -     folic acid (FOLVITE) 1 MG tablet; Take 1 tablet (1 mg total) by mouth daily. -  chlordiazePOXIDE (LIBRIUM) 25 MG capsule; Take by mouth 50 mg three times a day for 3 days, then 50 mg twice daily for 3 days, then 25 mg twice daily for 3 days, then 25 mg once daily for 3 days then STOP   There are no diagnoses linked to this encounter.  No orders of the defined types were placed in this encounter.   Follow-up: Return in about 3 weeks (around 07/09/2017) for check in with social worker.   Boykin Nearing MD

## 2017-06-18 NOTE — BH Specialist Note (Signed)
Integrated Behavioral Health Initial Visit  MRN: [id number] Name: [name] [name]   Session Start time: 4:00 PM Session End time: 4:25 PM Total time: 25  Type of Service: [place] Interpretor:No. Interpretor Name and Language: N/A   Warm Hand Off Completed.       SUBJECTIVE: [name] [name] is a 56 y.o. male accompanied by patient. Patient was referred by Dr. Adrian Blackwater for anxiety and depression. Patient reports the following symptoms/concerns: overwhelming feelings of sadness and worry, difficulty sleeping, low energy, difficulty focusing, feeling like a failure, and daily substance use (alcohol) Duration of problem: "couple of months"; Severity of problem: moderate  OBJECTIVE: Mood: Anxious and Depressed and Affect: Appropriate Risk of harm to self or others: No plan to harm self or others   LIFE CONTEXT: Family and Social: Pt is temporarily residing with Aunt School/Work: Pt is employed full-time and receives food stamps Self-Care: Pt relapsed on alcohol "couple of months" ago. He reports drinking 2-3 forty ounces of beer daily Life Changes: Pt recently relapsed and is experiencing financial strain.   GOALS ADDRESSED: Patient will reduce symptoms of: anxiety and depression and increase knowledge and/or ability of: coping skills and also: Increase motivation to adhere to plan of care and Decrease self-medicating behaviors   INTERVENTIONS: Motivational Interviewing, Supportive Counseling and Link to [employer]  Standardized Assessments completed: GAD-7 and PHQ 2&9  ASSESSMENT: Patient currently experiencing depression and anxiety triggered by financial strain and recent relapse on alcohol. He reports overwhelming feelings of sadness and worry, difficulty sleeping, low energy, difficulty focusing, and feeling like a failure. He is temporarily receiving support from Aunt and has friends that provide emotional support. Patient may benefit  from psychotherapy. Patient is participating in medication management through PCP. He is knowledgeable of AA resources; however, is not interested in participating in groups at this time. [facility] provided pt with resources to obtain affordable housing, transportation, and food insecurity.   PLAN: 1. Follow up with behavioral health clinician on : Pt was encouraged tocontact [facility] if symptoms worsen or fail to improveto schedule behavioral appointments at [facility]. 2. Behavioral recommendations: [facility] recommends that pt apply healthy coping skills discussed, comply with medication management, and utilize provided resources. Pt is encouraged to schedule follow up appointment with [facility] 3. Referral(s): [employer] Resources:  Haematologist, Astronomer 4. "From scale of 1-10, how likely are you to follow plan?": [name] Lewis, LCSW 06/20/17 5:36 PM

## 2017-06-18 NOTE — Assessment & Plan Note (Signed)
A: generalized anxiety exacerbated by substance abuse  P: Continue lexapro 20 mg daily Add buspar 5 mg BID

## 2017-06-18 NOTE — Assessment & Plan Note (Addendum)
Alcohol abuse x 2 months of daily ETOH  Plan: Protonix Folic acid and thiamine Librium taper Treating anxiety  Recommended AA

## 2017-06-29 ENCOUNTER — Emergency Department (HOSPITAL_COMMUNITY)
Admission: EM | Admit: 2017-06-29 | Discharge: 2017-06-29 | Disposition: A | Discharge status: Other Acute Inpatient Hospital | Payer: Self-pay | Attending: Emergency Medicine | Admitting: Emergency Medicine

## 2017-06-29 ENCOUNTER — Encounter (HOSPITAL_COMMUNITY): Payer: Self-pay | Admitting: Nurse Practitioner

## 2017-06-29 ENCOUNTER — Inpatient Hospital Stay (HOSPITAL_COMMUNITY)
Admission: AD | Admit: 2017-06-29 | Discharge: 2017-07-04 | DRG: 885 | Disposition: A | Discharge status: Home / Self Care | Payer: Federal, State, Local not specified - Other | Source: Intra-hospital | Attending: Psychiatry | Admitting: Psychiatry

## 2017-06-29 DIAGNOSIS — F1014 Alcohol abuse with alcohol-induced mood disorder: Secondary | ICD-10-CM | POA: Diagnosis present

## 2017-06-29 DIAGNOSIS — Z590 Homelessness: Secondary | ICD-10-CM

## 2017-06-29 DIAGNOSIS — Z79899 Other long term (current) drug therapy: Secondary | ICD-10-CM | POA: Insufficient documentation

## 2017-06-29 DIAGNOSIS — Z8249 Family history of ischemic heart disease and other diseases of the circulatory system: Secondary | ICD-10-CM

## 2017-06-29 DIAGNOSIS — E538 Deficiency of other specified B group vitamins: Secondary | ICD-10-CM | POA: Diagnosis present

## 2017-06-29 DIAGNOSIS — R45851 Suicidal ideations: Secondary | ICD-10-CM | POA: Insufficient documentation

## 2017-06-29 DIAGNOSIS — G629 Polyneuropathy, unspecified: Secondary | ICD-10-CM | POA: Diagnosis present

## 2017-06-29 DIAGNOSIS — F1010 Alcohol abuse, uncomplicated: Secondary | ICD-10-CM | POA: Insufficient documentation

## 2017-06-29 DIAGNOSIS — E785 Hyperlipidemia, unspecified: Secondary | ICD-10-CM | POA: Diagnosis present

## 2017-06-29 DIAGNOSIS — F411 Generalized anxiety disorder: Secondary | ICD-10-CM | POA: Diagnosis present

## 2017-06-29 DIAGNOSIS — K219 Gastro-esophageal reflux disease without esophagitis: Secondary | ICD-10-CM | POA: Diagnosis present

## 2017-06-29 DIAGNOSIS — Z886 Allergy status to analgesic agent status: Secondary | ICD-10-CM

## 2017-06-29 DIAGNOSIS — F322 Major depressive disorder, single episode, severe without psychotic features: Secondary | ICD-10-CM | POA: Diagnosis present

## 2017-06-29 DIAGNOSIS — R4182 Altered mental status, unspecified: Secondary | ICD-10-CM | POA: Insufficient documentation

## 2017-06-29 DIAGNOSIS — Z87891 Personal history of nicotine dependence: Secondary | ICD-10-CM | POA: Diagnosis not present

## 2017-06-29 DIAGNOSIS — G4700 Insomnia, unspecified: Secondary | ICD-10-CM | POA: Diagnosis present

## 2017-06-29 DIAGNOSIS — I10 Essential (primary) hypertension: Secondary | ICD-10-CM | POA: Diagnosis present

## 2017-06-29 DIAGNOSIS — Z811 Family history of alcohol abuse and dependence: Secondary | ICD-10-CM | POA: Diagnosis not present

## 2017-06-29 DIAGNOSIS — D860 Sarcoidosis of lung: Secondary | ICD-10-CM | POA: Diagnosis present

## 2017-06-29 DIAGNOSIS — F332 Major depressive disorder, recurrent severe without psychotic features: Secondary | ICD-10-CM | POA: Diagnosis present

## 2017-06-29 DIAGNOSIS — Z833 Family history of diabetes mellitus: Secondary | ICD-10-CM

## 2017-06-29 LAB — COMPREHENSIVE METABOLIC PANEL
ALT: 40 U/L (ref 17–63)
AST: 63 U/L — ABNORMAL HIGH (ref 15–41)
Albumin: 4.5 g/dL (ref 3.5–5.0)
Alkaline Phosphatase: 36 U/L — ABNORMAL LOW (ref 38–126)
Anion gap: 10 (ref 5–15)
BUN: 5 mg/dL — ABNORMAL LOW (ref 6–20)
CO2: 25 mmol/L (ref 22–32)
Calcium: 9.1 mg/dL (ref 8.9–10.3)
Chloride: 95 mmol/L — ABNORMAL LOW (ref 101–111)
Creatinine, Ser: 0.94 mg/dL (ref 0.61–1.24)
GFR calc Af Amer: >60 mL/min (ref >60)
GFR calc non Af Amer: >60 mL/min (ref >60)
Glucose, Bld: 97 mg/dL (ref 65–99)
Potassium: 4.1 mmol/L (ref 3.5–5.1)
Sodium: 130 mmol/L — ABNORMAL LOW (ref 135–145)
Total Bilirubin: 1.2 mg/dL (ref 0.3–1.2)
Total Protein: 8.2 g/dL — ABNORMAL HIGH (ref 6.5–8.1)

## 2017-06-29 LAB — CBC
HCT: 40.2 % (ref 39.0–52.0)
Hemoglobin: 13.4 g/dL (ref 13.0–17.0)
MCH: 26.3 pg (ref 26.0–34.0)
MCHC: 33.3 g/dL (ref 30.0–36.0)
MCV: 79 fL (ref 78.0–100.0)
Platelets: 148 10*3/uL — ABNORMAL LOW (ref 150–400)
RBC: 5.09 MIL/uL (ref 4.22–5.81)
RDW: 15.4 % (ref 11.5–15.5)
WBC: 6.3 10*3/uL (ref 4.0–10.5)

## 2017-06-29 LAB — RAPID URINE DRUG SCREEN, HOSP PERFORMED
Amphetamines: NOT DETECTED
Barbiturates: NOT DETECTED
Benzodiazepines: NOT DETECTED
Cocaine: NOT DETECTED
Opiates: NOT DETECTED
Tetrahydrocannabinol: NOT DETECTED

## 2017-06-29 LAB — ACETAMINOPHEN LEVEL: Acetaminophen (Tylenol), Serum: <10 ug/mL — ABNORMAL LOW (ref 10–30)

## 2017-06-29 LAB — SALICYLATE LEVEL: Salicylate Lvl: <7 mg/dL (ref 2.8–30.0)

## 2017-06-29 LAB — ETHANOL: Alcohol, Ethyl (B): 332 mg/dL (ref <5)

## 2017-06-29 LAB — I-STAT TROPONIN, ED
Troponin i, poc: 0 ng/mL (ref 0.00–0.08)
Troponin i, poc: 0.01 ng/mL (ref 0.00–0.08)

## 2017-06-29 LAB — POC OCCULT BLOOD, ED: Fecal Occult Bld: NEGATIVE

## 2017-06-29 MED ORDER — BUSPIRONE HCL 10 MG PO TABS
5.0000 mg | ORAL_TABLET | Freq: Two times a day (BID) | ORAL | Status: DC
  Administered 2017-06-29: 5 mg via ORAL
  Filled 2017-06-29: qty 1

## 2017-06-29 MED ORDER — LORAZEPAM 2 MG/ML IJ SOLN: 0.0000 mg | Freq: Two times a day (BID) | INTRAMUSCULAR | Status: DC

## 2017-06-29 MED ORDER — ALBUTEROL SULFATE HFA 108 (90 BASE) MCG/ACT IN AERS: 2.0000 | INHALATION_SPRAY | RESPIRATORY_TRACT | Status: DC | PRN

## 2017-06-29 MED ORDER — FOLIC ACID 1 MG PO TABS
1.0000 mg | ORAL_TABLET | Freq: Every day | ORAL | Status: DC
  Administered 2017-06-29: 1 mg via ORAL
  Filled 2017-06-29: qty 1

## 2017-06-29 MED ORDER — ESCITALOPRAM OXALATE 10 MG PO TABS
20.0000 mg | ORAL_TABLET | Freq: Every day | ORAL | Status: DC
  Filled 2017-06-29: qty 2

## 2017-06-29 MED ORDER — FLUTICASONE PROPIONATE 50 MCG/ACT NA SUSP
2.0000 | Freq: Every day | NASAL | Status: DC
  Filled 2017-06-29: qty 16

## 2017-06-29 MED ORDER — IPRATROPIUM BROMIDE 0.06 % NA SOLN
2.0000 | Freq: Four times a day (QID) | NASAL | Status: DC
  Administered 2017-06-29: 2 via NASAL
  Filled 2017-06-29: qty 15

## 2017-06-29 MED ORDER — LORAZEPAM 1 MG PO TABS: 0.0000 mg | ORAL_TABLET | Freq: Two times a day (BID) | ORAL | Status: DC

## 2017-06-29 MED ORDER — LORAZEPAM 2 MG/ML IJ SOLN
0.0000 mg | Freq: Four times a day (QID) | INTRAMUSCULAR | Status: DC
Start: 2017-06-29 — End: 2017-06-29

## 2017-06-29 MED ORDER — THIAMINE HCL 100 MG/ML IJ SOLN: 100.0000 mg | Freq: Every day | INTRAMUSCULAR | Status: DC

## 2017-06-29 MED ORDER — LORAZEPAM 1 MG PO TABS
0.0000 mg | ORAL_TABLET | Freq: Four times a day (QID) | ORAL | Status: DC
  Administered 2017-06-29: 1 mg via ORAL
  Filled 2017-06-29: qty 1

## 2017-06-29 MED ORDER — SODIUM CHLORIDE 0.9 % IV BOLUS (SEPSIS)
2000.0000 mL | Freq: Once | INTRAVENOUS | Status: AC
  Administered 2017-06-29: 2000 mL via INTRAVENOUS

## 2017-06-29 MED ORDER — VITAMIN B-1 100 MG PO TABS
100.0000 mg | ORAL_TABLET | Freq: Every day | ORAL | Status: DC
  Administered 2017-06-29: 100 mg via ORAL
  Filled 2017-06-29: qty 1

## 2017-06-29 NOTE — ED Notes (Addendum)
Last drink was about 3:30 pm and was a 40 oz beer. Pt states he started drinking around 4 am.   Pt states that in a day he drinks 4, 40 oz of beer a day.

## 2017-06-29 NOTE — ED Provider Notes (Addendum)
[facility] DEPT Provider Note   CSN: [id number] Arrival date & time: 06/29/17  1600     History   Chief Complaint Chief Complaint  Patient presents with  . Chest Pain  . Alcohol Problem   LEVEL05 caveat psychiatric problem  HPI [name] [name] is a 56 y.o. male.  HPI Patient feeling suicidal for the past 4 days. He does not have a definite plan. He is suicidal over being homeless. He admits to drinking alcohol, 340 ounce beers per day. Last drank immediately prior to coming here. He also reports chest pain intermittently right-sided anterior for several days which is worse with drinking alcohol and improved with "pressing on the area. He is presently pain-free. Pain is nonexertional. No shortness of breath. He admits to vomiting last time yesterday. No hematemesis. He does admit to occasional slight amount of blood mixed with his stool, last time yesterday. No other associated symptoms. He presently feels dehydrated and hungry Past Medical History:  Diagnosis Date  . Alcohol abuse    Last Drink on Sep 2015  . Anxiety    About 56 years of age  . Carpal tunnel syndrome of right wrist Dx 2015  . Depression    At 56 years of age  . GERD (gastroesophageal reflux disease) Dx 2007  . 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 Hyperlipidemia    borderline, diet controlled  . Pancreatitis Dx 2014  . Pneumonia 11/2014  . 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 Reflux Dx 2007  . 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 Lateral epicondylitis of right elbow 01/05/2017  . Strain, MCP, hand, right, initial encounter 01/05/2017  . 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 Bilateral knee pain 01/11/2016  . Chronic pain of right wrist 07/07/2015  . Carpal tunnel syndrome of right wrist 05/20/2015  . Dislocation of right shoulder joint 05/20/2015  . Tear of medial meniscus of right knee 03/24/2015  . 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 Chronic cough 01/15/2015  . Onychomycosis of toenail 12/15/2014  . Seborrhea 11/11/2014  . GERD (gastroesophageal reflux disease)   . Alcohol abuse 01/19/2014  . Generalized anxiety disorder 09/02/2013    Past Surgical History:  Procedure Laterality Date  . carpel tunnel release Right 07/28/2014    done in [place]   . ELBOW SURGERY    . KNEE ARTHROSCOPY    . KNEE SURGERY Left    15 years ago  . LIPOMA EXCISION N/A 05/10/2016   Procedure: EXCISION OF SCALP LIPOMA;  Surgeon: Johnathan Hausen, MD;  Location: [facility];  Service: General;  Laterality: N/A;       Home Medications    Prior to Admission medications   Medication Sig Start Date End Date Taking? Authorizing Provider  escitalopram (LEXAPRO) 20 MG tablet Take 1 tablet (20 mg total) by mouth daily. 06/18/17  Yes Funches, Adriana Mccallum, MD  folic acid (FOLVITE) 1 MG tablet Take 1 tablet (1 mg total) by mouth daily. 06/18/17  Yes Funches, Josalyn, MD  albuterol (PROVENTIL HFA;VENTOLIN HFA) 108 (90 Base) MCG/ACT inhaler Inhale 2 puffs into the lungs every 4 (four) hours as needed for wheezing or shortness of breath. Patient not taking: Reported on 04/02/2017 03/01/17   Janne Napoleon, NP  albuterol (PROVENTIL HFA;VENTOLIN HFA) 108 (90 Base) MCG/ACT inhaler Inhale 1-2 puffs into the lungs every 6 (six) hours as needed for wheezing or shortness of breath. Patient not taking: Reported on  06/18/2017 03/18/17   Doristine Devoid, PA-C  busPIRone (BUSPAR) 5 MG tablet Take 1 tablet (5 mg total) by mouth 2 (two) times daily. Patient not taking: Reported on 06/29/2017 06/18/17   Boykin Nearing, MD  chlordiazePOXIDE (LIBRIUM) 25 MG capsule Take by mouth 50 mg three times a day for 3 days, then 50 mg twice daily for 3 days, then 25 mg twice daily for 3 days, then 25 mg once daily for 3 days then STOP Patient taking differently: Take 25-50 mg by mouth See admin instructions. Take 2 capsules (50 mg) by mouth three times a day for  3 days, then take 2 capsules (50 mg) twice daily for 3 days, then take 1 capsule (25 mg) twice daily for 3 days, then take 1 capsule (25 mg) once daily for 3 days, then STOP 06/18/17   Funches, Josalyn, MD  fluticasone (FLONASE) 50 MCG/ACT nasal spray Place 2 sprays into both nostrils daily. Patient not taking: Reported on 06/29/2017 11/09/16   Boykin Nearing, MD  fluticasone (FLONASE) 50 MCG/ACT nasal spray Place 2 sprays into both nostrils daily. Patient not taking: Reported on 04/02/2017 03/18/17   Ocie Cornfield T, PA-C  ipratropium (ATROVENT) 0.06 % nasal spray Place 2 sprays into both nostrils 4 (four) times daily. Patient not taking: Reported on 03/26/2017 03/01/17   Janne Napoleon, NP  pantoprazole (PROTONIX) 40 MG tablet Take 1 tablet (40 mg total) by mouth daily. Patient not taking: Reported on 06/29/2017 06/18/17   Boykin Nearing, MD  thiamine (VITAMIN B-1) 100 MG tablet Take 1 tablet (100 mg total) by mouth daily. Patient not taking: Reported on 06/29/2017 06/18/17   Boykin Nearing, MD  Turmeric 500 MG CAPS Take 500 mg by mouth daily. Patient not taking: Reported on 03/26/2017 01/04/17   Boykin Nearing, MD    Family History Family History  Problem Relation Age of Onset  . Diabetes Father   . Hypertension Father   . Hypertension Paternal Grandfather   . Alcohol abuse Paternal Grandfather   . Alcohol abuse Maternal Grandfather   . Alcohol abuse Maternal Grandmother   . Alcohol abuse Paternal Grandmother   . Colon cancer Neg Hx   . Rectal cancer Neg Hx   . Stomach cancer Neg Hx     Social History Social History  Substance Use Topics  . Smoking status: Former Smoker    Types: Cigars  . Smokeless tobacco: Never Used     Comment: smokes occasional black &mild  . Alcohol use 0.0 oz/week     Comment: rehab   Quit smoking more than 20 years ago  Allergies   Oxycodone   Review of Systems Review of Systems  Unable to perform ROS: Psychiatric disorder  Cardiovascular: Positive  for chest pain.  Gastrointestinal: Positive for blood in stool.  Psychiatric/Behavioral: Positive for suicidal ideas.     Physical Exam Updated Vital Signs BP 119/68   Pulse 76   Temp 98.6 F (37 C) (Oral)   Resp 18   SpO2 98%   Physical Exam  Constitutional: He is oriented to person, place, and time. He appears well-developed and well-nourished.  HENT:  Head: Normocephalic and atraumatic.  Eyes: Pupils are equal, round, and reactive to light. Conjunctivae are normal.  Neck: Neck supple. No tracheal deviation present. No thyromegaly present.  Cardiovascular: Normal rate and regular rhythm.   No murmur heard. Pulmonary/Chest: Effort normal and breath sounds normal.  Abdominal: Soft. Bowel sounds are normal. He exhibits no distension. There is no tenderness.  Genitourinary: Rectum normal. Rectal exam shows guaiac negative stool.  Genitourinary Comments: Rectal normal tone brown stool Hemoccult negative  Musculoskeletal: Normal range of motion. He exhibits no edema or tenderness.  Neurological: He is alert and oriented to person, place, and time. Coordination normal.  Gait normal cranial nerves II through XII grossly intact. Speech is clear. Moves all extremity swell.  Skin: Skin is warm and dry. No rash noted.  Psychiatric: He has a normal mood and affect.  Nursing note and vitals reviewed.    ED Treatments / Results  Labs (all labs ordered are listed, but only abnormal results are displayed) Labs Reviewed  COMPREHENSIVE METABOLIC PANEL - Abnormal; Notable for the following:       Result Value   Sodium 130 (*)    Chloride 95 (*)    BUN 5 (*)    Total Protein 8.2 (*)    AST 63 (*)    Alkaline Phosphatase 36 (*)    All other components within normal limits  ETHANOL - Abnormal; Notable for the following:    Alcohol, Ethyl (B) 332 (*)    All other components within normal limits  ACETAMINOPHEN LEVEL - Abnormal; Notable for the following:    Acetaminophen (Tylenol),  Serum <10 (*)    All other components within normal limits  CBC - Abnormal; Notable for the following:    Platelets 148 (*)    All other components within normal limits  SALICYLATE LEVEL  RAPID URINE DRUG SCREEN, HOSP PERFORMED  I-STAT TROPONIN, ED  POC OCCULT BLOOD, ED    EKG  EKG Interpretation  Date/Time:  Friday June 29 2017 16:04:40 EDT Ventricular Rate:  81 PR Interval:  140 QRS Duration: 88 QT Interval:  352 QTC Calculation: 408 R Axis:   76 Text Interpretation:  Normal sinus rhythm Left ventricular hypertrophy ST elevation, consider early repolarization Abnormal ECG No significant change since last tracing Confirmed by Orlie Dakin [phone]) on 06/29/2017 4:40:38 PM      Results for orders placed or performed during the hospital encounter of 06/29/17  Comprehensive metabolic panel  Result Value Ref Range   Sodium 130 (L) 135 - 145 mmol/L   Potassium 4.1 3.5 - 5.1 mmol/L   Chloride 95 (L) 101 - 111 mmol/L   CO2 25 22 - 32 mmol/L   Glucose, Bld 97 65 - 99 mg/dL   BUN 5 (L) 6 - 20 mg/dL   Creatinine, Ser 0.94 0.61 - 1.24 mg/dL   Calcium 9.1 8.9 - 10.3 mg/dL   Total Protein 8.2 (H) 6.5 - 8.1 g/dL   Albumin 4.5 3.5 - 5.0 g/dL   AST 63 (H) 15 - 41 U/L   ALT 40 17 - 63 U/L   Alkaline Phosphatase 36 (L) 38 - 126 U/L   Total Bilirubin 1.2 0.3 - 1.2 mg/dL   GFR calc non Af Amer >60 >60 mL/min   GFR calc Af Amer >60 >60 mL/min   Anion gap 10 5 - 15  Ethanol  Result Value Ref Range   Alcohol, Ethyl (B) 332 (HH) <5 mg/dL  Salicylate level  Result Value Ref Range   Salicylate Lvl <1.1 2.8 - 30.0 mg/dL  Acetaminophen level  Result Value Ref Range   Acetaminophen (Tylenol), Serum <10 (L) 10 - 30 ug/mL  cbc  Result Value Ref Range   WBC 6.3 4.0 - 10.5 K/uL   RBC 5.09 4.22 - 5.81 MIL/uL   Hemoglobin 13.4 13.0 - 17.0 g/dL   HCT 40.2  39.0 - 52.0 %   MCV 79.0 78.0 - 100.0 fL   MCH 26.3 26.0 - 34.0 pg   MCHC 33.3 30.0 - 36.0 g/dL   RDW 15.4 11.5 - 15.5 %   Platelets  148 (L) 150 - 400 K/uL  Rapid urine drug screen (hospital performed)  Result Value Ref Range   Opiates NONE DETECTED NONE DETECTED   Cocaine NONE DETECTED NONE DETECTED   Benzodiazepines NONE DETECTED NONE DETECTED   Amphetamines NONE DETECTED NONE DETECTED   Tetrahydrocannabinol NONE DETECTED NONE DETECTED   Barbiturates NONE DETECTED NONE DETECTED  I-Stat Troponin, ED (not at [facility])  Result Value Ref Range   Troponin i, poc 0.00 0.00 - 0.08 ng/mL   Comment 3          POC occult blood, ED Provider will collect  Result Value Ref Range   Fecal Occult Bld NEGATIVE NEGATIVE  I-stat troponin, ED  Result Value Ref Range   Troponin i, poc 0.01 0.00 - 0.08 ng/mL   Comment 3           No results found.  Radiology No results found.  Procedures Procedures (including critical care time)  Medications Ordered in ED Medications  sodium chloride 0.9 % bolus 2,000 mL (not administered)  albuterol (PROVENTIL HFA;VENTOLIN HFA) 108 (90 Base) MCG/ACT inhaler 2 puff (not administered)  ipratropium (ATROVENT) 0.06 % nasal spray 2 spray (not administered)  fluticasone (FLONASE) 50 MCG/ACT nasal spray 2 spray (not administered)  busPIRone (BUSPAR) tablet 5 mg (not administered)  escitalopram (LEXAPRO) tablet 20 mg (not administered)  folic acid (FOLVITE) tablet 1 mg (not administered)     Initial Impression / Assessment and Plan / ED Course  I have reviewed the triage vital signs and the nursing notes.  Pertinent labs & imaging results that were available during my care of the patient were reviewed by me and considered in my medical decision making (see chart for details).    8:25 PM feels improved after treatment with intravenous hydration. Presently patient does not appear to be clinically intoxicated Strongly doubt cardiac etiology of chest pain. Heart score equals 3  TTS consulted patient is medically cleared for psychiatric evaluation. Final Clinical Impressions(s) / ED Diagnoses  #1  suicidal ideation #2 acute and chronic alcohol abuse #3 homelessness Final diagnoses:  None  #4 atypical chest pain  New Prescriptions New Prescriptions   No medications on file     Orlie Dakin, MD 06/29/17 2030 Addendum 1105 patient is alert and in no distress ambulates without difficulty. Glasgow Coma Score 15, pleasant cooperative . Stable for transfer to [facility]. Dr.Cobos is accepting physician   Orlie Dakin, MD 06/29/17 2314

## 2017-06-29 NOTE — ED Notes (Signed)
Pt given food and drink.

## 2017-06-29 NOTE — BH Assessment (Addendum)
Tele Assessment Note   [name] [name] is an 56 y.o. separated male who presents unaccompanied to [facility] reporting alcohol use and symptoms of depression including suicidal ideation. Pt reports he has experienced suicidal ideation for the past four days with no plan or intent. He says he has never experienced suicidal ideation before and is worried about these thoughts. Pt reports symptoms including crying spells, social withdrawal, loss of interest in usual pleasures, fatigue, irritability, decreased concentration, decreased sleep, decreased appetite and feelings of guilt and hopelessness. Pt says he has lost 20 pounds over the past two months. He reports a history of anxiety and says he becomes very anxiety when experiencing alcohol withdrawal. He denies any history of previous suicide attempts or intentional self-injurious behavior. He denies homicidal ideation or any history of violence or aggression. He denies any history of auditory or visual hallucinations.  Pt reports he began drinking alcohol at age 37 and over the years became dependent on alcohol to manage symptoms of anxiety. He states he has been drinking approximately four bottles of 40-ounce beer daily for the past two months. He presented to [facility] with a blood alcohol level of 332. Pt denies any other substance use; urine drug screen is negative. Pt reports he has withdrawal symptoms including anxiety, nausea, vomiting, diarrhea, tremors and that he must drinking during the night to avoid withdrawal.  Pt identifies homelessness as his primary stressor. He says he was staying at [facility] until three months ago. He says he recently lost his transportation. 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 He says his family practices "tough love" and he doesn't feel he has any support as long as he is drinking. Pt says he has two adult sons. He denies any history of abuse or trauma. He denies  any current legal problems.  Pt is dressed in hospital scrubs, alert, oriented x4 with normal speech and normal motor behavior. Eye contact is good. Pt's mood is depressed and anxious; affect is congruent with mood. Thought process is coherent and relevant. There is no indication Pt is currently responding to internal stimuli or experiencing delusional thought content. Pt was cooperative throughout assessment. He says he is motivated to stop drinking and is willing to sign voluntarily into a psychiatric facility.    Diagnosis: Major Depressive Disorder, Single Episode, Severe Without Psychotic Features; Alcohol Use Disorder, Severe  Past Medical History:  Past Medical History:  Diagnosis Date  . Alcohol abuse    Last Drink on Sep 2015  . Anxiety    About 56 years of age  . Carpal tunnel syndrome of right wrist Dx 2015  . Depression    At 56 years of age  . GERD (gastroesophageal reflux disease) Dx 2007  . Hepatitis, alcoholic, acute 2725  . Hyperlipidemia    borderline, diet controlled  . Pancreatitis Dx 2014  . Pneumonia 11/2014  . 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 Reflux Dx 2007  . 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 carpel tunnel release Right 07/28/2014    done in [place]   . ELBOW SURGERY    . KNEE ARTHROSCOPY    . KNEE SURGERY Left    15 years ago  . LIPOMA EXCISION N/A 05/10/2016   Procedure: EXCISION OF SCALP LIPOMA;  Surgeon: Johnathan Hausen, MD;  Location: [place];  Service: General;  Laterality: N/A;    Family History:  Family History  Problem Relation Age of Onset  . Diabetes Father   . Hypertension Father   . Hypertension Paternal Grandfather   . Alcohol abuse Paternal Grandfather   . Alcohol abuse Maternal Grandfather   . Alcohol abuse Maternal Grandmother   . Alcohol abuse Paternal Grandmother   . Colon cancer Neg Hx   . Rectal cancer Neg Hx   . Stomach cancer  Neg Hx     Social History:  reports that he has quit smoking. His smoking use included Cigars. He has never used smokeless tobacco. He reports that he drinks alcohol. He reports that he does not use drugs.  Additional Social History:  Alcohol / Drug Use Pain Medications: See MAR Prescriptions: See MAR Over the Counter: See MAR History of alcohol / drug use?: Yes Longest period of sobriety (when/how long): 9 Months Negative Consequences of Use: Financial, Personal relationships, Work / School Withdrawal Symptoms: Patient aware of relationship between substance abuse and physical/medical complications, Diarrhea, Sweats, Nausea / Vomiting, Tremors Substance #1 Name of Substance 1: Alcohol 1 - Age of First Use: 19 1 - Amount (size/oz): 4 bottles of 40-ounce beer 1 - Frequency: Daily 1 - Duration: Two months this episode 1 - Last Use / Amount: 07/02/17, 80 ounces of beer  CIWA: CIWA-Ar BP: 130/80 Pulse Rate: 71 Nausea and Vomiting: no nausea and no vomiting Tactile Disturbances: very mild itching, pins and needles, burning or numbness Tremor: no tremor Auditory Disturbances: not present Paroxysmal Sweats: no sweat visible Visual Disturbances: not present Anxiety: no anxiety, at ease Headache, Fullness in Head: none present Agitation: normal activity Orientation and Clouding of Sensorium: oriented and can do serial additions CIWA-Ar Total: 1 COWS:    PATIENT STRENGTHS: (choose at least two) Ability for insight Average or above average intelligence Capable of independent living Communication skills Financial means General fund of knowledge Motivation for treatment/growth Physical Health Religious Affiliation Work skills  Allergies:  Allergies  Allergen Reactions  . Oxycodone Other (See Comments)    abd pain, stomach cramps     Home Medications:  (Not in a hospital admission)  OB/GYN Status:  No LMP for male patient.  General Assessment Data Location of  Assessment: [facility] ED TTS Assessment: In system Is this a Tele or Face-to-Face Assessment?: Tele Assessment Is this an Initial Assessment or a Re-assessment for this encounter?: Initial Assessment Marital status: Separated Maiden name: NA Is patient pregnant?: No Pregnancy Status: No Living Arrangements: Other (Comment) (Homeless) Can pt return to current living arrangement?: Yes Admission Status: Voluntary Is patient capable of signing voluntary admission?: Yes Referral Source: Self/Family/Friend Insurance type: Self-pay     Crisis Care Plan Living Arrangements: Other (Comment) (Homeless) Legal Guardian: Other: (Self) Name of Psychiatrist: None Name of Therapist: None  Education Status Is patient currently in school?: No Current Grade: NA Highest grade of school patient has completed: Some college Name of school: NA Contact person: NA  Risk to self with the past 6 months Suicidal Ideation: Yes-Currently Present Has patient been a risk to self within the past 6 months prior to admission? : Yes Suicidal Intent: No Has patient had any suicidal intent within the past 6 months prior to admission? : No Is patient at risk for suicide?: Yes Suicidal Plan?: No Has patient had any suicidal plan within the past 6 months prior to admission? : No Access to Means: No What has been your use of drugs/alcohol within  the last 12 months?: Pt drinking 160 ounces of beer daily Previous Attempts/Gestures: No How many times?: 0 Other Self Harm Risks: None Triggers for Past Attempts: None known Intentional Self Injurious Behavior: None Family Suicide History: Yes (Cousin died by suicide) Recent stressful life event(s): Financial Problems, Other (Comment) (Homeless) Persecutory voices/beliefs?: No Depression: Yes Depression Symptoms: Despondent, Tearfulness, Isolating, Fatigue, Guilt, Loss of interest in usual pleasures, Feeling worthless/self pity, Feeling angry/irritable Substance abuse  history and/or treatment for substance abuse?: Yes Suicide prevention information given to non-admitted patients: Not applicable  Risk to Others within the past 6 months Homicidal Ideation: No Does patient have any lifetime risk of violence toward others beyond the six months prior to admission? : No Thoughts of Harm to Others: No Current Homicidal Intent: No Current Homicidal Plan: No Access to Homicidal Means: No Identified Victim: None History of harm to others?: No Assessment of Violence: None Noted Violent Behavior Description: Pt denies history of violence Does patient have access to weapons?: No Criminal Charges Pending?: No Does patient have a court date: No Is patient on probation?: No  Psychosis Hallucinations: None noted Delusions: None noted  Mental Status Report Appearance/Hygiene: In scrubs Eye Contact: Good Motor Activity: Unremarkable Speech: Logical/coherent Level of Consciousness: Alert, Other (Comment) (Intoxicated) Mood: Depressed, Anxious Affect: Depressed, Anxious Anxiety Level: Moderate Thought Processes: Coherent, Relevant Judgement: Unimpaired Orientation: Person, Place, Time, Situation, Appropriate for developmental age Obsessive Compulsive Thoughts/Behaviors: None  Cognitive Functioning Concentration: Decreased Memory: Recent Intact, Remote Intact IQ: Average Insight: Good Impulse Control: Fair Appetite: Poor Weight Loss: 20 Weight Gain: 0 Sleep: Decreased Total Hours of Sleep: 5 Vegetative Symptoms: None  ADLScreening [facility] Assessment Services) Patient's cognitive ability adequate to safely complete daily activities?: Yes Patient able to express need for assistance with ADLs?: Yes Independently performs ADLs?: Yes (appropriate for developmental age)  Prior Inpatient Therapy Prior Inpatient Therapy: Yes Prior Therapy Dates: 2017 Prior Therapy Facilty/Provider(s): Pt cannot remember Reason for Treatment: Alcohol detox  Prior  Outpatient Therapy Prior Outpatient Therapy: Yes Prior Therapy Dates: 2018 Prior Therapy Facilty/Provider(s): Alcoholics Anonymous Reason for Treatment: Alcohol use Does patient have an ACCT team?: No Does patient have Intensive In-House Services?  : No Does patient have [facility] services? : No Does patient have P4CC services?: No  ADL Screening (condition at time of admission) Patient's cognitive ability adequate to safely complete daily activities?: Yes Is the patient deaf or have difficulty hearing?: No Does the patient have difficulty seeing, even when wearing glasses/contacts?: No Does the patient have difficulty concentrating, remembering, or making decisions?: No Patient able to express need for assistance with ADLs?: Yes Does the patient have difficulty dressing or bathing?: No Independently performs ADLs?: Yes (appropriate for developmental age) Does the patient have difficulty walking or climbing stairs?: No Weakness of Legs: None Weakness of Arms/Hands: None  Home Assistive Devices/Equipment Home Assistive Devices/Equipment: None    Abuse/Neglect Assessment (Assessment to be complete while patient is alone) Physical Abuse: Denies Verbal Abuse: Denies Sexual Abuse: Denies Exploitation of patient/patient's resources: Denies Self-Neglect: Denies     Regulatory affairs officer (For Healthcare) Does Patient Have a Medical Advance Directive?: No Would patient like information on creating a medical advance directive?: No - Patient declined    Additional Information 1:1 In Past 12 Months?: No CIRT Risk: No Elopement Risk: No Does patient have medical clearance?: Yes     Disposition: Brook McNichol, AC at [facility], confirmed a bed will be available after 2300. Gave clinical report to Lindon Romp, NP who  said Pt meets criteria for dual diagnosis treatment and accepted Pt to the service of Dr. Wanda Plump. [facility], room 305-1. Notified Dr. Lyn Hollingshead and Rosita Kea, RN of  acceptance.  Disposition Initial Assessment Completed for this Encounter: Yes Disposition of Patient: Inpatient treatment program Type of inpatient treatment program: Adult  Evelena Peat, [facility], [facility], [facility] Triage Specialist [phone]   Evelena Peat 06/29/2017 9:42 PM

## 2017-06-29 NOTE — ED Notes (Addendum)
Pt accepted at [facility] per Marijean Bravo Accepting physician is MD [facility] Pt can be transferred after 2300

## 2017-06-29 NOTE — ED Notes (Signed)
Dr. Winfred Leeds at bedside with this RN

## 2017-06-29 NOTE — ED Notes (Signed)
Dr. Jacubowitz at bedside 

## 2017-06-29 NOTE — ED Triage Notes (Signed)
Pt presents with c/o chest pain and suicidal thoughts. He has been struggling with alcoholism for about 20 years. Over the past several weeks he's been drinking more, about 4 40 oz beers daily, and has started to feel depressed and thought of killing himself. He denies thoughts of harming others. He has also been having chest pain and blood in his stool over the past few  Weeks. He reports poor food intake, rarely eats solid foods.

## 2017-06-29 NOTE — ED Notes (Signed)
Pt transferred to [facility] via [place]. Pt has no questions at this time. Pt cooperative and alert. Pt belongings given to [employer]

## 2017-06-30 ENCOUNTER — Encounter (HOSPITAL_COMMUNITY): Payer: Self-pay | Admitting: *Deleted

## 2017-06-30 DIAGNOSIS — F332 Major depressive disorder, recurrent severe without psychotic features: Secondary | ICD-10-CM | POA: Diagnosis present

## 2017-06-30 DIAGNOSIS — D869 Sarcoidosis, unspecified: Secondary | ICD-10-CM

## 2017-06-30 DIAGNOSIS — Z630 Problems in relationship with spouse or partner: Secondary | ICD-10-CM

## 2017-06-30 DIAGNOSIS — G629 Polyneuropathy, unspecified: Secondary | ICD-10-CM

## 2017-06-30 DIAGNOSIS — F1020 Alcohol dependence, uncomplicated: Secondary | ICD-10-CM

## 2017-06-30 DIAGNOSIS — K219 Gastro-esophageal reflux disease without esophagitis: Secondary | ICD-10-CM

## 2017-06-30 LAB — LIPID PANEL
Cholesterol: 330 mg/dL — ABNORMAL HIGH (ref 0–200)
HDL: 196 mg/dL (ref >40)
LDL Cholesterol: 122 mg/dL — ABNORMAL HIGH (ref 0–99)
Total CHOL/HDL Ratio: 1.7 RATIO
Triglycerides: 61 mg/dL (ref <150)
VLDL: 12 mg/dL (ref 0–40)

## 2017-06-30 LAB — TSH: TSH: 0.985 u[IU]/mL (ref 0.350–4.500)

## 2017-06-30 MED ORDER — VITAMIN B-1 100 MG PO TABS
100.0000 mg | ORAL_TABLET | Freq: Every day | ORAL | Status: DC
  Administered 2017-07-01 – 2017-07-04 (×4): 100 mg via ORAL
  Filled 2017-06-30 (×5): qty 1
  Filled 2017-06-30: qty 7

## 2017-06-30 MED ORDER — ADULT MULTIVITAMIN W/MINERALS CH
1.0000 | ORAL_TABLET | Freq: Every day | ORAL | Status: DC
  Administered 2017-06-30 – 2017-07-04 (×5): 1 via ORAL
  Filled 2017-06-30 (×7): qty 1

## 2017-06-30 MED ORDER — LORAZEPAM 1 MG PO TABS: 1.0000 mg | ORAL_TABLET | Freq: Every day | ORAL | Status: DC

## 2017-06-30 MED ORDER — LORAZEPAM 1 MG PO TABS
1.0000 mg | ORAL_TABLET | Freq: Three times a day (TID) | ORAL | Status: AC
  Administered 2017-07-01 – 2017-07-02 (×3): 1 mg via ORAL
  Filled 2017-06-30 (×3): qty 1

## 2017-06-30 MED ORDER — GABAPENTIN 600 MG PO TABS
300.0000 mg | ORAL_TABLET | Freq: Three times a day (TID) | ORAL | Status: DC
  Administered 2017-06-30: 300 mg via ORAL
  Filled 2017-06-30 (×5): qty 0.5

## 2017-06-30 MED ORDER — ONDANSETRON 4 MG PO TBDP
4.0000 mg | ORAL_TABLET | Freq: Four times a day (QID) | ORAL | Status: AC | PRN
  Administered 2017-07-01: 4 mg via ORAL
  Filled 2017-06-30 (×2): qty 1

## 2017-06-30 MED ORDER — ALUM & MAG HYDROXIDE-SIMETH 200-200-20 MG/5ML PO SUSP: 30.0000 mL | ORAL | Status: DC | PRN

## 2017-06-30 MED ORDER — LORAZEPAM 1 MG PO TABS
1.0000 mg | ORAL_TABLET | Freq: Two times a day (BID) | ORAL | Status: AC
  Administered 2017-07-02 – 2017-07-03 (×2): 1 mg via ORAL
  Filled 2017-06-30 (×2): qty 1

## 2017-06-30 MED ORDER — PNEUMOCOCCAL VAC POLYVALENT 25 MCG/0.5ML IJ INJ
0.5000 mL | INJECTION | INTRAMUSCULAR | Status: AC
  Administered 2017-07-01: 0.5 mL via INTRAMUSCULAR

## 2017-06-30 MED ORDER — FLUTICASONE PROPIONATE 50 MCG/ACT NA SUSP
2.0000 | Freq: Every day | NASAL | Status: DC
  Administered 2017-06-30 – 2017-07-04 (×5): 2 via NASAL
  Filled 2017-06-30: qty 16

## 2017-06-30 MED ORDER — LOPERAMIDE HCL 2 MG PO CAPS: 2.0000 mg | ORAL_CAPSULE | ORAL | Status: AC | PRN

## 2017-06-30 MED ORDER — ESCITALOPRAM OXALATE 20 MG PO TABS
20.0000 mg | ORAL_TABLET | Freq: Every day | ORAL | Status: DC
  Administered 2017-06-30 – 2017-07-04 (×5): 20 mg via ORAL
  Filled 2017-06-30 (×6): qty 1
  Filled 2017-06-30: qty 7

## 2017-06-30 MED ORDER — NALTREXONE HCL 50 MG PO TABS
50.0000 mg | ORAL_TABLET | Freq: Every day | ORAL | Status: DC
  Administered 2017-06-30 – 2017-07-04 (×5): 50 mg via ORAL
  Filled 2017-06-30 (×4): qty 1
  Filled 2017-06-30: qty 7
  Filled 2017-06-30 (×2): qty 1

## 2017-06-30 MED ORDER — PANTOPRAZOLE SODIUM 40 MG PO TBEC
40.0000 mg | DELAYED_RELEASE_TABLET | Freq: Every day | ORAL | Status: DC
  Administered 2017-06-30 – 2017-07-04 (×5): 40 mg via ORAL
  Filled 2017-06-30 (×7): qty 1

## 2017-06-30 MED ORDER — MAGNESIUM HYDROXIDE 400 MG/5ML PO SUSP: 30.0000 mL | Freq: Every day | ORAL | Status: DC | PRN

## 2017-06-30 MED ORDER — ALBUTEROL SULFATE HFA 108 (90 BASE) MCG/ACT IN AERS: 2.0000 | INHALATION_SPRAY | RESPIRATORY_TRACT | Status: DC | PRN

## 2017-06-30 MED ORDER — HYDROXYZINE HCL 25 MG PO TABS
25.0000 mg | ORAL_TABLET | Freq: Four times a day (QID) | ORAL | Status: AC | PRN
  Administered 2017-06-30 – 2017-07-02 (×3): 25 mg via ORAL
  Filled 2017-06-30 (×4): qty 1

## 2017-06-30 MED ORDER — TRAZODONE HCL 50 MG PO TABS
50.0000 mg | ORAL_TABLET | Freq: Every evening | ORAL | Status: DC | PRN
  Administered 2017-06-30 – 2017-07-03 (×4): 50 mg via ORAL
  Filled 2017-06-30 (×4): qty 1
  Filled 2017-06-30: qty 7

## 2017-06-30 MED ORDER — FOLIC ACID 1 MG PO TABS
1.0000 mg | ORAL_TABLET | Freq: Every day | ORAL | Status: DC
  Administered 2017-06-30 – 2017-07-04 (×5): 1 mg via ORAL
  Filled 2017-06-30 (×5): qty 1
  Filled 2017-06-30: qty 7
  Filled 2017-06-30: qty 1

## 2017-06-30 MED ORDER — LORAZEPAM 1 MG PO TABS
1.0000 mg | ORAL_TABLET | Freq: Four times a day (QID) | ORAL | Status: AC
  Administered 2017-06-30 – 2017-07-01 (×6): 1 mg via ORAL
  Filled 2017-06-30 (×5): qty 1

## 2017-06-30 MED ORDER — LORAZEPAM 1 MG PO TABS
1.0000 mg | ORAL_TABLET | Freq: Four times a day (QID) | ORAL | Status: AC | PRN
Start: 2017-06-30 — End: 2017-07-03
  Filled 2017-06-30: qty 1

## 2017-06-30 MED ORDER — BUSPIRONE HCL 5 MG PO TABS
5.0000 mg | ORAL_TABLET | Freq: Two times a day (BID) | ORAL | Status: DC
  Administered 2017-06-30: 5 mg via ORAL
  Filled 2017-06-30 (×5): qty 1

## 2017-06-30 NOTE — BHH Group Notes (Signed)
[place] Group Notes:  (Nursing/MHT/Case Management/Adjunct)  Date:  06/30/2017  Time:  2:15 PM  Type of Therapy:  Nurse Education  Participation Level:  Did not attend

## 2017-06-30 NOTE — Progress Notes (Signed)
The patient did not attend group since he was not feeling well.

## 2017-06-30 NOTE — BHH Group Notes (Signed)
[place] LCSW Group Therapy Note  06/30/2017 @ 10:10 to 11 AM  Type of Therapy and Topic:  Group Therapy: Avoiding Self-Sabotaging and Enabling Behaviors  Participation Level:  Did Not Attend;invited to participate yet did not despite overhead announcement and encouragement by staff   Description of Group The main focus of today's process group to discuss what "self-sabotage" means and use motivational iInterviewing to discuss what benefits, negative or positive, were involved in a self-identified self-sabotaging behavior. We then talked about reasons the patient may want to change the behavior and their current desire to change.   Summary of Patient Progress: Patient did not attend   Therapeutic molalities: Cognitive Behavioral Therapy Person-Centered Therapy Motivational Interviewing  Therapeutic Goals: 1. Patients will demonstrate understanding of the concept of self sabotage 2. Patients will be able to identify pros and cons of their behaviors 3. Patients will be able to identify at least two motivating factors for l of their desire for change   Sheilah Pigeon, LCSW

## 2017-06-30 NOTE — Tx Team (Signed)
Initial Treatment Plan 06/30/2017 12:50 AM [name] [name] HOO:[id number]    PATIENT STRESSORS: Health problems Medication change or noncompliance Occupational concerns Substance abuse   PATIENT STRENGTHS: Ability for insight Communication skills General fund of knowledge Motivation for treatment/growth Supportive family/friends   PATIENT IDENTIFIED PROBLEMS: Substance Abuse  Anxiety  "Detox"  "Sources on staying sober"  "Housing"             DISCHARGE CRITERIA:  Ability to meet basic life and health needs Adequate post-discharge living arrangements Improved stabilization in mood, thinking, and/or behavior Motivation to continue treatment in a less acute level of care Need for constant or close observation no longer present Verbal commitment to aftercare and medication compliance Withdrawal symptoms are absent or subacute and managed without 24-hour nursing intervention  PRELIMINARY DISCHARGE PLAN: Attend 12-step recovery group Outpatient therapy Placement in alternative living arrangements  PATIENT/FAMILY INVOLVEMENT: This treatment plan has been presented to and reviewed with the patient, [name] [name].  The patient and family have been given the opportunity to ask questions and make suggestions.  Dustin Flock, RN 06/30/2017, 12:50 AM

## 2017-06-30 NOTE — Progress Notes (Signed)
Patient remains in bed, c/o severe withdrawal symptoms, fatigue, nausea. Compliant with medications, encouraged to drink fluids. Poor appetite at this time.

## 2017-06-30 NOTE — BHH Suicide Risk Assessment (Signed)
[facility] Admission Suicide Risk Assessment   Nursing information obtained from:  Patient Demographic factors:  Male, Divorced or widowed, Low socioeconomic status Current Mental Status:  Self-harm thoughts Loss Factors:  Decrease in vocational status, Decline in physical health Historical Factors:  Family history of suicide, Family history of mental illness or substance abuse Risk Reduction Factors:  Living with another person, especially a relative, Positive social support  Total Time spent with patient: 30 minutes Principal Problem: <principal problem not specified> Diagnosis:   Patient Active Problem List   Diagnosis Date Noted  . Severe major depression ([place]) [F32.2] 06/30/2017  . Lateral epicondylitis of right elbow [M77.11] 01/05/2017  . Strain, MCP, hand, right, initial encounter [N98.921J] 01/05/2017  . 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 Bilateral knee pain [M25.561, M25.562] 01/11/2016  . Chronic pain of right wrist [M25.531, G89.29] 07/07/2015  . 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 Dislocation of right shoulder joint [S43.004A] 05/20/2015  . Tear of medial meniscus of right knee [S83.241A] 03/24/2015  . 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 Chronic cough [R05] 01/15/2015  . Onychomycosis of toenail [B35.1] 12/15/2014  . Seborrhea [L21.9] 11/11/2014  . GERD (gastroesophageal reflux disease) [K21.9]   . Alcohol abuse [F10.10] 01/19/2014  . Generalized anxiety disorder [F41.1] 09/02/2013   Subjective Data:  56 yo AAM, separated, homeless. Recently got out of [facility] about three months ago. Background history of AUD and Mood Disorder. Self presented to the ER with chest pain, blood is his stool. Patient reported depression with suicidal thoughts. Patient drinks daily and presented with BAL of 331 mg/dl. Significant labs includes sodium 130 meq/l and elevated AST. Other parameters were essentially normal. UDS was negative. History of  daily alcohol use. He is currently detoxing from alcohol. Overwhelmed by recent physical symptoms. Has some alcohol related physical illness. Worried by being homeless, difficulties with transportation and prospects of losing his new job. No other stressors. Not pervasively depressed. No associated psychosis or mania. No thoughts of violence. No homicidal thoughts. Does not use any illicit drugs. Voluntarily seeking help. Well connected to his two sons. Has some motivation to stay sober. Has agreed to medication adjustments.   Continued Clinical Symptoms:  Alcohol Use Disorder Identification Test Final Score (AUDIT): 35 The "Alcohol Use Disorders Identification Test", Guidelines for Use in Primary Care, Second Edition.  World Pharmacologist [facility]). Score between 0-7:  no or low risk or alcohol related problems. Score between 8-15:  moderate risk of alcohol related problems. Score between 16-19:  high risk of alcohol related problems. Score 20 or above:  warrants further diagnostic evaluation for alcohol dependence and treatment.   CLINICAL FACTORS:   Alcohol/Substance Abuse/Dependencies   Musculoskeletal: Strength & Muscle Tone: within normal limits Gait & Station: normal Patient leans: N/A  Psychiatric Specialty Exam: Physical Exam  ROS  Blood pressure (!) 148/95, pulse 80, temperature 98.6 F (37 C), temperature source Oral, resp. rate 16, height 5\' 9"  (1.753 m), weight 62.6 kg (138 lb).Body mass index is 20.38 kg/m.  General Appearance: As in H&P  Eye Contact:  As in H&P  Speech:  As in H&P  Volume:  As in H&P  Mood:  As in H&P  Affect:  As in H&P  Thought Process:  As in H&P  Orientation:  As in H&P  Thought Content:  As in H&P  Suicidal Thoughts:  As in H&P  Homicidal Thoughts:  As in H&P  Memory:  As in H&P  Judgement:  As in H&P  Insight:  As in H&P  Psychomotor Activity:  As in H&P  Concentration:  As in H&P  Recall:  As in H&P  Fund of Knowledge:  As in H&P   Language:  As in H&P  Akathisia:  As in H&P  Handed:  As in H&P  AIMS (if indicated):     Assets:  As in H&P  ADL's:  As in H&P  Cognition:  As in H&P  Sleep:  Number of Hours: 4      COGNITIVE FEATURES THAT CONTRIBUTE TO RISK:  None    SUICIDE RISK:   Mild:  Suicidal ideation of limited frequency, intensity, duration, and specificity.  There are no identifiable plans, no associated intent, mild dysphoria and related symptoms, good self-control (both objective and subjective assessment), few other risk factors, and identifiable protective factors, including available and accessible social support.  PLAN OF CARE:  As in H&P  I certify that inpatient services furnished can reasonably be expected to improve the patient's condition.   [place], MD 06/30/2017, 1:55 PM

## 2017-06-30 NOTE — Progress Notes (Addendum)
Patient ID: [name] [name], male   DOB: 04/07/1961, 56 y.o.   MRN: [id number]  Report received from admitting nurse, Lissa Merlin. Pt requested sleep medications during admission. Pt approached while lying in bed. Pt provided with medications per providers orders. Pt supported and encouraged to express concerns and questions, denies any currently. Pt's safety ensured with 15 minute and environmental checks. Verbally agrees to contact staff before acting on any harmful thoughts. Will continue to monitor.

## 2017-06-30 NOTE — Progress Notes (Signed)
Admission Note:  56 year old male who presents voluntary, in no acute distress, for the treatment of SI, Substance Abuse, and Anxiety. Patient appears flat, anxious, and fidgety. Patient was cooperative with admission process. Patient presents with passive SI and contracts for safety upon admission. Patient denies AVH.  Prior to admission, patient reports SI without a plan.  Patient identifies multiple stressors to include: "Haven't been taking my meds for about 3 weeks due to alcohol", "Not having a steady place to live", and "May lose my job to come in here and get help".  Patient reports that he has been homeless for the past year and was living in the [facility] for 6 months prior to admission.   Patient reports drinking "at least #4 40's a day".  Patient identifies his family as his support system.  Patient is unsure where he will go upon discharge.  While at [facility], patient would like to work on "detox", "Sources on staying sober", and "housing".  Skin was assessed and found to be clear of any abnormal marks apart from scabs on knees bilateral and eczema on forehead.  Patient searched and no contraband found, POC and unit policies explained and understanding verbalized. Consents obtained. Food and fluids offered and accepted. Patient had no additional questions or concerns.

## 2017-06-30 NOTE — H&P (Signed)
Psychiatric Admission Assessment Adult  Patient Identification: [name] [name] MRN:  [id number] Date of Evaluation:  06/30/2017 Chief Complaint:  etoh induced mood disordeer Principal Diagnosis: <principal problem not specified> Diagnosis:   Patient Active Problem List   Diagnosis Date Noted  . Severe major depression ([place]) [F32.2] 06/30/2017  . Lateral epicondylitis of right elbow [M77.11] 01/05/2017  . Strain, MCP, hand, right, initial encounter [X91.478G] 01/05/2017  . 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 Bilateral knee pain [M25.561, M25.562] 01/11/2016  . Chronic pain of right wrist [M25.531, G89.29] 07/07/2015  . 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 Dislocation of right shoulder joint [S43.004A] 05/20/2015  . Tear of medial meniscus of right knee [S83.241A] 03/24/2015  . 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 Chronic cough [R05] 01/15/2015  . Onychomycosis of toenail [B35.1] 12/15/2014  . Seborrhea [L21.9] 11/11/2014  . GERD (gastroesophageal reflux disease) [K21.9]   . Alcohol abuse [F10.10] 01/19/2014  . Generalized anxiety disorder [F41.1] 09/02/2013   History of Present Illness:  56 yo AAM, separated, homeless. Recently got out of [facility] about three months ago. Background history of AUD and Mood Disorder. Self presented to the ER with chest pain, blood is his stool. Patient reported depression with suicidal thoughts. Patient drinks daily and presented with BAL of 331 mg/dl. Significant labs includes sodium 130 meq/l and elevated AST. Other parameters were essentially normal. UDS was negative.  At interview, he reports daily pattern of drinking. Drinks over 160 ounce of beer daily. Says typically he does not eat or drink water once he is drinking alcohol. Reports medical issues related to alcohol use " vomiting ,,,, I cannot hold down food ,,,, I feel a bit sore in my abdomen ,,,, I have pins and needles in my feet ,,,,, I feel anxious  most of the time". Recently noticed blood in his stool. No coffee stained vomitus. No dark stool. Abdominal pain is not excruciating and not radiating to his back.  Patient states that he has become very overwhelmed since he came out of [facility]. 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 Says he is worried his alcohol use would cost him his job. Says he has to take three buses before he makes it to work. Lost his truck over a year ago. Says transportation and homelessness are his main stressors. Tells me that he tends to get anxious and not necessarily depressed. Sleep wake cycle is messed up by alcohol. Says he gets into sleep while intoxicated but wakes very early in the morning. Little motivation to do things other than seek and use alcohol. No other interest, no other social outlet. Says his two children are supportive. No associated sweatiness, no headaches. No fullness in the head. No visual, tactile or auditory hallucination. No internal restlessness. No current suicidal or homicidal thoughts. No thoughts of violence. No overwhelming anxiety. No access to weapons. Says he wants to get help.    Total Time spent with patient: 1 hour  Past Psychiatric History: AUD, Mood disorder. He was admitted once years ago. Says it was under similar circumstances. He has been in detox over seven times. He has been in rehab twice. He just got out of rehab about three months ago. He is followed by his PCP. He is prescribed Lexapro 20 mg daily. Buspirone was recently added. Patient reports suicidal thoughts while intoxicated. Says he has never attempted suicide in the past. No past history of violent behavior.   Is  the patient at risk to self? No.  Has the patient been a risk to self in the past 6 months? No.  Has the patient been a risk to self within the distant past? Yes.    Is the patient a risk to others? No.  Has the patient been a risk to others in the past 6 months? No.  Has the patient  been a risk to others within the distant past? No.   Prior Inpatient Therapy:   Prior Outpatient Therapy:    Alcohol Screening: 1. How often do you have a drink containing alcohol?: 4 or more times a week 2. How many drinks containing alcohol do you have on a typical day when you are drinking?: 10 or more 3. How often do you have six or more drinks on one occasion?: Daily or almost daily Preliminary Score: 8 4. How often during the last year have you found that you were not able to stop drinking once you had started?: Daily or almost daily 5. How often during the last year have you failed to do what was normally expected from you becasue of drinking?: Weekly 6. How often during the last year have you needed a first drink in the morning to get yourself going after a heavy drinking session?: Daily or almost daily 7. How often during the last year have you had a feeling of guilt of remorse after drinking?: Daily or almost daily 8. How often during the last year have you been unable to remember what happened the night before because you had been drinking?: Never 9. Have you or someone else been injured as a result of your drinking?: Yes, during the last year 10. Has a relative or friend or a doctor or another health worker been concerned about your drinking or suggested you cut down?: Yes, during the last year Alcohol Use Disorder Identification Test Final Score (AUDIT): 35 Brief Intervention: Yes Substance Abuse History in the last 12 months:  Yes.   Consequences of Substance Abuse: As above Previous Psychotropic Medications: Yes  Psychological Evaluations: Yes  Past Medical History:  Past Medical History:  Diagnosis Date  . Alcohol abuse    Last Drink on Sep 2015  . Anxiety    About 56 years of age  . Carpal tunnel syndrome of right wrist Dx 2015  . Depression    At 56 years of age  . GERD (gastroesophageal reflux disease) Dx 2007  . Hepatitis, alcoholic, acute 9528  . Hyperlipidemia     borderline, diet controlled  . Pancreatitis Dx 2014  . Pneumonia 11/2014  . 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 Reflux Dx 2007  . 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 carpel tunnel release Right 07/28/2014    done in [place]   . ELBOW SURGERY    . KNEE ARTHROSCOPY    . KNEE SURGERY Left    15 years ago  . LIPOMA EXCISION N/A 05/10/2016   Procedure: EXCISION OF SCALP LIPOMA;  Surgeon: Johnathan Hausen, MD;  Location: [facility];  Service: General;  Laterality: N/A;   Family History:  Family History  Problem Relation Age of Onset  . Diabetes Father   . Hypertension Father   . Hypertension Paternal Grandfather   . Alcohol abuse Paternal Grandfather   . Alcohol abuse Maternal Grandfather   . Alcohol abuse Maternal Grandmother   . Alcohol abuse Paternal Grandmother   .  Colon cancer Neg Hx   . Rectal cancer Neg Hx   . Stomach cancer Neg Hx    Family Psychiatric  History: Family history of SUD in both sides of his family. No family history of any major mental illness. No family  History of suicide.  Tobacco Screening: Have you used any form of tobacco in the last 30 days? (Cigarettes, Smokeless Tobacco, Cigars, and/or Pipes): No Social History:  History  Alcohol Use  . 0.0 oz/week    Comment: rehab     History  Drug Use No    Comment: Patient denies     Additional Social History:    Allergies:   Allergies  Allergen Reactions  . Oxycodone Other (See Comments)    abd pain, stomach cramps    Lab Results:  Results for orders placed or performed during the hospital encounter of 06/29/17 (from the past 48 hour(s))  TSH     Status: None   Collection Time: 06/30/17  6:17 AM  Result Value Ref Range   TSH 0.985 0.350 - 4.500 uIU/mL    Comment: Performed by a 3rd Generation assay with a functional sensitivity of <=0.01 uIU/mL. Performed at [facility], [place] [address] [place], [place] [address]     Blood Alcohol level:  Lab Results  Component Value Date   ETH 332 [facility]) 06/29/2017   ETH 253 (H) 09/62/8366    Metabolic Disorder Labs:  Lab Results  Component Value Date   HGBA1C 6.1 03/26/2017   No results found for: PROLACTIN Lab Results  Component Value Date   CHOL 239 (H) 12/15/2014   TRIG 122 12/15/2014   HDL 87 12/15/2014   CHOLHDL 2.7 12/15/2014   VLDL 24 12/15/2014   LDLCALC 128 (H) 12/15/2014    Current Medications: Current Facility-Administered Medications  Medication Dose Route Frequency Provider Last Rate Last Dose  . albuterol (PROVENTIL HFA;VENTOLIN HFA) 108 (90 Base) MCG/ACT inhaler 2 puff  2 puff Inhalation Q4H PRN Lindon Romp A, NP      . alum & mag hydroxide-simeth (MAALOX/MYLANTA) 200-200-20 MG/5ML suspension 30 mL  30 mL Oral Q4H PRN Lindon Romp A, NP      . escitalopram (LEXAPRO) tablet 20 mg  20 mg Oral Daily Lindon Romp A, NP   20 mg at 06/30/17 0953  . fluticasone (FLONASE) 50 MCG/ACT nasal spray 2 spray  2 spray Each Nare Daily Lindon Romp A, NP   2 spray at 06/30/17 0953  . folic acid (FOLVITE) tablet 1 mg  1 mg Oral Daily Lindon Romp A, NP   1 mg at 06/30/17 0953  . hydrOXYzine (ATARAX/VISTARIL) tablet 25 mg  25 mg Oral Q6H PRN Lindon Romp A, NP   25 mg at 06/30/17 0106  . loperamide (IMODIUM) capsule 2-4 mg  2-4 mg Oral PRN Lindon Romp A, NP      . LORazepam (ATIVAN) tablet 1 mg  1 mg Oral Q6H PRN Lindon Romp A, NP      . LORazepam (ATIVAN) tablet 1 mg  1 mg Oral QID Lindon Romp A, NP   1 mg at 06/30/17 0953   Followed by  . [START ON 07/01/2017] LORazepam (ATIVAN) tablet 1 mg  1 mg Oral TID Rozetta Nunnery, NP       Followed by  . [START ON 07/02/2017] LORazepam (ATIVAN) tablet 1 mg  1 mg Oral BID Rozetta Nunnery, NP       Followed by  . [START ON 07/04/2017]  LORazepam (ATIVAN) tablet 1 mg  1 mg Oral Daily Lindon Romp A, NP      . magnesium hydroxide (MILK OF MAGNESIA) suspension 30 mL  30 mL Oral Daily PRN  Lindon Romp A, NP      . multivitamin with minerals tablet 1 tablet  1 tablet Oral Daily Lindon Romp A, NP   1 tablet at 06/30/17 0954  . ondansetron (ZOFRAN-ODT) disintegrating tablet 4 mg  4 mg Oral Q6H PRN Lindon Romp A, NP      . pantoprazole (PROTONIX) EC tablet 40 mg  40 mg Oral Daily Lindon Romp A, NP   40 mg at 06/30/17 0954  . [START ON 07/01/2017] pneumococcal 23 valent vaccine (PNU-IMMUNE) injection 0.5 mL  0.5 mL Intramuscular Tomorrow-1000 Lindon Romp A, NP      . Derrill Memo ON 07/01/2017] thiamine (VITAMIN B-1) tablet 100 mg  100 mg Oral Daily Lindon Romp A, NP      . traZODone (DESYREL) tablet 50 mg  50 mg Oral QHS PRN Rozetta Nunnery, NP   50 mg at 06/30/17 0106   PTA Medications: Prescriptions Prior to Admission  Medication Sig Dispense Refill Last Dose  . albuterol (PROVENTIL HFA;VENTOLIN HFA) 108 (90 Base) MCG/ACT inhaler Inhale 2 puffs into the lungs every 4 (four) hours as needed for wheezing or shortness of breath. (Patient not taking: Reported on 04/02/2017) 1 Inhaler 0 Not Taking at Unknown time  . albuterol (PROVENTIL HFA;VENTOLIN HFA) 108 (90 Base) MCG/ACT inhaler Inhale 1-2 puffs into the lungs every 6 (six) hours as needed for wheezing or shortness of breath. (Patient not taking: Reported on 06/18/2017) 1 Inhaler 0 Not Taking at Unknown time  . busPIRone (BUSPAR) 5 MG tablet Take 1 tablet (5 mg total) by mouth 2 (two) times daily. (Patient not taking: Reported on 06/29/2017) 60 tablet 2 Not Taking at Unknown time  . chlordiazePOXIDE (LIBRIUM) 25 MG capsule Take by mouth 50 mg three times a day for 3 days, then 50 mg twice daily for 3 days, then 25 mg twice daily for 3 days, then 25 mg once daily for 3 days then STOP (Patient taking differently: Take 25-50 mg by mouth See admin instructions. Take 2 capsules (50 mg) by mouth three times a day for 3 days, then take 2 capsules (50 mg) twice daily for 3 days, then take 1 capsule (25 mg) twice daily for 3 days, then take 1 capsule (25 mg)  once daily for 3 days, then STOP) 39 capsule 0 not yet started  . escitalopram (LEXAPRO) 20 MG tablet Take 1 tablet (20 mg total) by mouth daily. 30 tablet 5 06/28/2017 at Unknown time  . fluticasone (FLONASE) 50 MCG/ACT nasal spray Place 2 sprays into both nostrils daily. (Patient not taking: Reported on 06/29/2017) 16 g 5 Not Taking at Unknown time  . fluticasone (FLONASE) 50 MCG/ACT nasal spray Place 2 sprays into both nostrils daily. (Patient not taking: Reported on 04/02/2017) 16 g 12 Not Taking at Unknown time  . folic acid (FOLVITE) 1 MG tablet Take 1 tablet (1 mg total) by mouth daily. 30 tablet 5 06/29/2017 at Unknown time  . ipratropium (ATROVENT) 0.06 % nasal spray Place 2 sprays into both nostrils 4 (four) times daily. (Patient not taking: Reported on 03/26/2017) 15 mL 12 Not Taking at Unknown time  . pantoprazole (PROTONIX) 40 MG tablet Take 1 tablet (40 mg total) by mouth daily. (Patient not taking: Reported on 06/29/2017) 30 tablet 3 Not Taking at Unknown time  .  thiamine (VITAMIN B-1) 100 MG tablet Take 1 tablet (100 mg total) by mouth daily. (Patient not taking: Reported on 06/29/2017) 30 tablet 5 Not Taking at Unknown time  . Turmeric 500 MG CAPS Take 500 mg by mouth daily. (Patient not taking: Reported on 03/26/2017)   Not Taking at Unknown time    Musculoskeletal: Strength & Muscle Tone: within normal limits Gait & Station: normal Patient leans: N/A  Psychiatric Specialty Exam: Physical Exam  Constitutional: He appears well-developed and well-nourished.  HENT:  Head: Normocephalic and atraumatic.  Neck: Normal range of motion.  Musculoskeletal: Normal range of motion.  Neurological: He is alert.  Skin: Skin is warm and dry.  Psychiatric:  As above    ROS  Blood pressure (!) 123/94, pulse (!) 137, temperature 98.6 F (37 C), temperature source Oral, resp. rate 16, height 5\' 9"  (1.753 m), weight 62.6 kg (138 lb).Body mass index is 20.38 kg/m.  General Appearance: In hospital  clothing, poor personal hygiene. Not shaky, not sweaty, not confused. Not unsteady, normal conjugate eye movements. Not internally distressed. Appropriate behavior.   Eye Contact:  Good  Speech:  Clear and Coherent and Normal Rate  Volume:  Normal  Mood:  Subjectively anxious. More worried than depressed  Affect:  Blunted   Thought Process:  Goal Directed  Orientation:  Full (Time, Place, and Person)  Thought Content:  Rumination  Suicidal Thoughts:  Yes.  without intent/plan  Homicidal Thoughts:  No  Memory:  Immediate;   Good Recent;   Fair Remote;   Fair  Judgement:  Fair  Insight:  Fair  Psychomotor Activity:  Decreased  Concentration:  Concentration: Fair and Attention Span: Fair  Recall:  [employer] of Knowledge:  Fair  Language:  Good  Akathisia:  NA  Handed:    AIMS (if indicated):     Assets:  Communication Skills Desire for Improvement Vocational/Educational  ADL's:  Impaired  Cognition:  WNL  Sleep:  Number of Hours: 4    Treatment Plan Summary: Patient is coming off alcohol. He has heightened subjective anxiety related to health scares. Has had suicidal thoughts but does not have any intent to harm self. Came in voluntarily to seek help. Worried being in hospital might make him lose his new job. We discussed use of naltrexone to address cravings. Patient consented to treatment after we explored the risks and benefits. We have agreed to hold Buspirone and add Gabapentin .  Psychiatric: AUD Alcohol Induced Mood Disorder  Medical: Peripheral neuropathy GERD Sarcoidosis  Psychosocial:  Homelessness Limited support  PLAN: 1. Alcohol withdrawal protocol 2. Naltrexone 50 mg daily to target cravings 3. Gabapentin 300 mg TID to target anxiety 4. Discontinue Buspirone.   5. Encourage unit groups and activities 6. Monitor mood, behavior and interaction with peers 7. Motivational enhancement  8. SW would facilitate aftercare.      Observation  Level/Precautions:  Detox 15 minute checks  Laboratory:    Psychotherapy:    Medications:    Consultations:    Discharge Concerns:    Estimated LOS: 3-5 days  Other:     Physician Treatment Plan for Primary Diagnosis: <principal problem not specified> Long Term Goal(s): Improvement in symptoms so as ready for discharge  Short Term Goals: Ability to identify changes in lifestyle to reduce recurrence of condition will improve, Ability to verbalize feelings will improve, Ability to disclose and discuss suicidal ideas, Ability to demonstrate self-control will improve, Ability to identify and develop effective coping behaviors will  improve, Ability to maintain clinical measurements within normal limits will improve, Compliance with prescribed medications will improve and Ability to identify triggers associated with substance abuse/mental health issues will improve  Physician Treatment Plan for Secondary Diagnosis: Active Problems:   Severe major depression ([place])  Long Term Goal(s): Improvement in symptoms so as ready for discharge  Short Term Goals: Ability to identify changes in lifestyle to reduce recurrence of condition will improve, Ability to verbalize feelings will improve, Ability to disclose and discuss suicidal ideas, Ability to demonstrate self-control will improve, Ability to identify and develop effective coping behaviors will improve, Ability to maintain clinical measurements within normal limits will improve, Compliance with prescribed medications will improve and Ability to identify triggers associated with substance abuse/mental health issues will improve  I certify that inpatient services furnished can reasonably be expected to improve the patient's condition.    [place], MD 8/4/201812:25 PM

## 2017-07-01 LAB — HEMOGLOBIN A1C
Hgb A1c MFr Bld: 5.8 % — ABNORMAL HIGH (ref 4.8–5.6)
Mean Plasma Glucose: 120 mg/dL

## 2017-07-01 MED ORDER — GABAPENTIN 300 MG PO CAPS
300.0000 mg | ORAL_CAPSULE | Freq: Three times a day (TID) | ORAL | Status: DC
  Administered 2017-07-01 – 2017-07-04 (×11): 300 mg via ORAL
  Filled 2017-07-01 (×3): qty 1
  Filled 2017-07-01: qty 21
  Filled 2017-07-01 (×6): qty 1
  Filled 2017-07-01: qty 21
  Filled 2017-07-01 (×4): qty 1
  Filled 2017-07-01: qty 21

## 2017-07-01 NOTE — Progress Notes (Signed)
[facility] MD Progress Note  07/01/2017 11:39 AM [name] [name]  MRN:  [id number] Subjective:   56 yo AAM, separated, homeless. Recently got out of [facility] about three months ago. Background history of AUD and Mood Disorder. Self presented to the ER with chest pain, blood is his stool. Patient reported depression with suicidal thoughts. Patient drinks daily and presented with BAL of 331 mg/dl. Significant labs includes sodium 130 meq/l and elevated AST. Other parameters were essentially normal. UDS was negative.  Chart reviewed today. Patient discussed at team today.  Staff reports that he has been isolating self most of the team. Has not attended unit groups. Not voicing any suicidal thoughts. No perceptual abnormalities reported or observed. Vitals has been stable. CIWA has been trending down.   Seen today. In his room while his peers were at a group session. Says he still feels "woozy" . No visual auditory or tactile hallucinations. Says he is very focused on turning his life around this time. Wants [place] to get in touch with his employers tomorrow. Says he does not want to lose his job. He hopes to find sober housing and attend AA. He is tolerating his food now. Says he has not thrown up. No nausea. No more blood in his stool. Reports past history of Hemorrhoids.   Principal Problem: AUD Diagnosis:   Patient Active Problem List   Diagnosis Date Noted  . Severe major depression ([place]) [F32.2] 06/30/2017  . Lateral epicondylitis of right elbow [M77.11] 01/05/2017  . Strain, MCP, hand, right, initial encounter [X44.818H] 01/05/2017  . 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 Bilateral knee pain [M25.561, M25.562] 01/11/2016  . Chronic pain of right wrist [M25.531, G89.29] 07/07/2015  . 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 Dislocation of right shoulder joint [S43.004A] 05/20/2015  . Tear of medial meniscus of right knee [S83.241A] 03/24/2015  . 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 Chronic cough [R05] 01/15/2015  . Onychomycosis of toenail [B35.1] 12/15/2014  . Seborrhea [L21.9] 11/11/2014  . GERD (gastroesophageal reflux disease) [K21.9]   . Alcohol abuse [F10.10] 01/19/2014  . Generalized anxiety disorder [F41.1] 09/02/2013   Total Time spent with patient: 20 minutes  Past Psychiatric History: As in H&P  Past Medical History:  Past Medical History:  Diagnosis Date  . Alcohol abuse    Last Drink on Sep 2015  . Anxiety    About 56 years of age  . Carpal tunnel syndrome of right wrist Dx 2015  . Depression    At 56 years of age  . GERD (gastroesophageal reflux disease) Dx 2007  . Hepatitis, alcoholic, acute 6314  . Hyperlipidemia    borderline, diet controlled  . Pancreatitis Dx 2014  . Pneumonia 11/2014  . 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 Reflux Dx 2007  . 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 carpel tunnel release Right 07/28/2014    done in [place]   . ELBOW SURGERY    . KNEE ARTHROSCOPY    . KNEE SURGERY Left    15 years ago  . LIPOMA EXCISION N/A 05/10/2016   Procedure: EXCISION OF SCALP LIPOMA;  Surgeon: Johnathan Hausen, MD;  Location: [facility];  Service: General;  Laterality: N/A;   Family History:  Family History  Problem Relation Age of Onset  . Diabetes Father   . Hypertension Father   . Hypertension Paternal Grandfather   .  Alcohol abuse Paternal Grandfather   . Alcohol abuse Maternal Grandfather   . Alcohol abuse Maternal Grandmother   . Alcohol abuse Paternal Grandmother   . Colon cancer Neg Hx   . Rectal cancer Neg Hx   . Stomach cancer Neg Hx    Family Psychiatric  History: As in H&P Social History:  History  Alcohol Use  . 0.0 oz/week    Comment: rehab     History  Drug Use No    Comment: Patient denies     Social History   Social History  . Marital status: Legally Separated    Spouse name: N/A  .  Number of children: N/A  . Years of education: N/A   Social History Main Topics  . Smoking status: Former Smoker    Types: Cigars  . Smokeless tobacco: Never Used     Comment: smokes occasional black &mild  . Alcohol use 0.0 oz/week     Comment: rehab  . Drug use: No     Comment: Patient denies   . Sexual activity: Not Asked   Other Topics Concern  . None   Social History Narrative  . None   Additional Social History:                         Sleep: Good  Appetite:  Better.   Current Medications: Current Facility-Administered Medications  Medication Dose Route Frequency Provider Last Rate Last Dose  . albuterol (PROVENTIL HFA;VENTOLIN HFA) 108 (90 Base) MCG/ACT inhaler 2 puff  2 puff Inhalation Q4H PRN Lindon Romp A, NP      . alum & mag hydroxide-simeth (MAALOX/MYLANTA) 200-200-20 MG/5ML suspension 30 mL  30 mL Oral Q4H PRN Lindon Romp A, NP      . escitalopram (LEXAPRO) tablet 20 mg  20 mg Oral Daily Lindon Romp A, NP   20 mg at 07/01/17 0856  . fluticasone (FLONASE) 50 MCG/ACT nasal spray 2 spray  2 spray Each Nare Daily Lindon Romp A, NP   2 spray at 07/01/17 0856  . folic acid (FOLVITE) tablet 1 mg  1 mg Oral Daily Lindon Romp A, NP   1 mg at 07/01/17 0856  . gabapentin (NEURONTIN) capsule 300 mg  300 mg Oral TID Cobos, Myer Peer, MD   300 mg at 07/01/17 0856  . hydrOXYzine (ATARAX/VISTARIL) tablet 25 mg  25 mg Oral Q6H PRN Lindon Romp A, NP   25 mg at 06/30/17 0106  . loperamide (IMODIUM) capsule 2-4 mg  2-4 mg Oral PRN Lindon Romp A, NP      . LORazepam (ATIVAN) tablet 1 mg  1 mg Oral Q6H PRN Lindon Romp A, NP      . LORazepam (ATIVAN) tablet 1 mg  1 mg Oral QID Lindon Romp A, NP   1 mg at 07/01/17 0858   Followed by  . LORazepam (ATIVAN) tablet 1 mg  1 mg Oral TID Rozetta Nunnery, NP       Followed by  . [START ON 07/02/2017] LORazepam (ATIVAN) tablet 1 mg  1 mg Oral BID Rozetta Nunnery, NP       Followed by  . [START ON 07/04/2017] LORazepam (ATIVAN)  tablet 1 mg  1 mg Oral Daily Lindon Romp A, NP      . magnesium hydroxide (MILK OF MAGNESIA) suspension 30 mL  30 mL Oral Daily PRN Lindon Romp A, NP      . multivitamin with minerals tablet  1 tablet  1 tablet Oral Daily Lindon Romp A, NP   1 tablet at 07/01/17 0856  . naltrexone (DEPADE) tablet 50 mg  50 mg Oral Daily Izediuno, Laruth Bouchard, MD   50 mg at 07/01/17 0856  . ondansetron (ZOFRAN-ODT) disintegrating tablet 4 mg  4 mg Oral Q6H PRN Lindon Romp A, NP      . pantoprazole (PROTONIX) EC tablet 40 mg  40 mg Oral Daily Lindon Romp A, NP   40 mg at 07/01/17 0856  . pneumococcal 23 valent vaccine (PNU-IMMUNE) injection 0.5 mL  0.5 mL Intramuscular Tomorrow-1000 Lindon Romp A, NP      . thiamine (VITAMIN B-1) tablet 100 mg  100 mg Oral Daily Lindon Romp A, NP   100 mg at 07/01/17 0856  . traZODone (DESYREL) tablet 50 mg  50 mg Oral QHS PRN Rozetta Nunnery, NP   50 mg at 06/30/17 0106    Lab Results:  Results for orders placed or performed during the hospital encounter of 06/29/17 (from the past 48 hour(s))  Hemoglobin A1c     Status: Abnormal   Collection Time: 06/30/17  6:17 AM  Result Value Ref Range   Hgb A1c MFr Bld 5.8 (H) 4.8 - 5.6 %    Comment: (NOTE)         Pre-diabetes: 5.7 - 6.4         Diabetes: >6.4         Glycemic control for adults with diabetes: <7.0    Mean Plasma Glucose 120 mg/dL    Comment: (NOTE) Performed At: [facility] [place], [place] [id number] Lindon Romp MD UX:[id number] Performed at [facility], [place] [address] [place], [place] [address]   Lipid panel     Status: Abnormal   Collection Time: 06/30/17  6:17 AM  Result Value Ref Range   Cholesterol 330 (H) 0 - 200 mg/dL   Triglycerides 61 <150 mg/dL   HDL 196 >40 mg/dL    Comment: RESULTS CONFIRMED BY MANUAL DILUTION   Total CHOL/HDL Ratio 1.7 RATIO   VLDL 12 0 - 40 mg/dL   LDL Cholesterol 122 (H) 0 - 99 mg/dL    Comment:        Total  Cholesterol/HDL:CHD Risk Coronary Heart Disease Risk Table                     Men   Women  1/2 Average Risk   3.4   3.3  Average Risk       5.0   4.4  2 X Average Risk   9.6   7.1  3 X Average Risk  23.4   11.0        Use the calculated Patient Ratio above and the CHD Risk Table to determine the patient's CHD Risk.        ATP III CLASSIFICATION (LDL):  <100     mg/dL   Optimal  100-129  mg/dL   Near or Above                    Optimal  130-159  mg/dL   Borderline  160-189  mg/dL   High  >190     mg/dL   Very High Performed at [place] [address], [place], [place] [address]   TSH     Status: None   Collection Time: 06/30/17  6:17 AM  Result Value Ref Range  TSH 0.985 0.350 - 4.500 uIU/mL    Comment: Performed by a 3rd Generation assay with a functional sensitivity of <=0.01 uIU/mL. Performed at [facility], [place] [address] [place], [place] [address]     Blood Alcohol level:  Lab Results  Component Value Date   ETH 332 [facility]) 06/29/2017   ETH 253 (H) 77/82/4235    Metabolic Disorder Labs: Lab Results  Component Value Date   HGBA1C 5.8 (H) 06/30/2017   MPG 120 06/30/2017   No results found for: PROLACTIN Lab Results  Component Value Date   CHOL 330 (H) 06/30/2017   TRIG 61 06/30/2017   HDL 196 06/30/2017   CHOLHDL 1.7 06/30/2017   VLDL 12 06/30/2017   LDLCALC 122 (H) 06/30/2017   LDLCALC 128 (H) 12/15/2014    Physical Findings: AIMS: Facial and Oral Movements Muscles of Facial Expression: None, normal Lips and Perioral Area: None, normal Jaw: None, normal Tongue: None, normal,Extremity Movements Upper (arms, wrists, hands, fingers): None, normal Lower (legs, knees, ankles, toes): None, normal, Trunk Movements Neck, shoulders, hips: None, normal, Overall Severity Severity of abnormal movements (highest score from questions above): None, normal Incapacitation due to abnormal movements: None, normal Patient's awareness of  abnormal movements (rate only patient's report): No Awareness, Dental Status Current problems with teeth and/or dentures?: No Does patient usually wear dentures?: No  CIWA:  CIWA-Ar Total: 4 COWS:     Musculoskeletal: Strength & Muscle Tone: within normal limits Gait & Station: normal Patient leans: N/A  Psychiatric Specialty Exam: Physical Exam  Constitutional: He is oriented to person, place, and time. He appears well-developed and well-nourished.  Eyes: Pupils are equal, round, and reactive to light.  Neck: Normal range of motion. Neck supple.  Respiratory: Effort normal.  Musculoskeletal: Normal range of motion.  Neurological: He is alert and oriented to person, place, and time.  Skin: Skin is warm and dry.  Psychiatric:  As above    ROS  Blood pressure 140/86, pulse 86, temperature 98.9 F (37.2 C), temperature source Oral, resp. rate 18, height 5\' 9"  (1.753 m), weight 62.6 kg (138 lb).Body mass index is 20.38 kg/m.  General Appearance: In bed, withdrawn, limited engagement.   Eye Contact:  Fair  Speech:  Soft spoken.  Volume:  Decreased  Mood:  Depressed and tired  Affect:  Appropriate and Restricted  Thought Process:  Linear  Orientation:  Full (Time, Place, and Person)  Thought Content:  Future oriented. No hallucination in any modality. No delusional theme.   Suicidal Thoughts:  No  Homicidal Thoughts:  No  Memory:  Immediate;   Fair Recent;   Fair Remote;   Good  Judgement:  Fair  Insight:  Partial   Psychomotor Activity:  Decreased  Concentration:  Concentration: Fair and Attention Span: Fair  Recall:  [employer] of Knowledge:  Good  Language:  Good  Akathisia:  Negative  Handed:    AIMS (if indicated):     Assets:  Communication Skills Desire for Improvement Resilience Vocational/Educational  ADL's:  Impaired  Cognition:  WNL  Sleep:  Number of Hours: 4     Treatment Plan Summary: Patient is still coming off alcohol. No complications as he is  still on tapering dose of benzodiazepines. We would evaluate him further.   Psychiatric: AUD Alcohol Induced Mood Disorder  Medical: Peripheral neuropathy GERD Sarcoidosis  Psychosocial:  Homelessness Limited support  PLAN: 1. Continue current medication regimen 2. Encourage unit groups and activities  3. Continue to  monitor mood, behavior and interaction with peers 4. SW would coordinate aftercare and discharge plans.   Artist [place], MD 07/01/2017, 11:39 AM

## 2017-07-01 NOTE — Plan of Care (Signed)
Problem: [facility] Concurrent Medical Problem Goal: STG-Other (Specify): STG-Other Concurrent Medical (Specify):  Outcome: Progressing PAtient c/o some nausea this hift and received PRN. Taking GERD medication as prescribed.

## 2017-07-01 NOTE — BHH Counselor (Signed)
Patient provided list of [place] with current vacancies  Mellody Memos [place], [place]

## 2017-07-01 NOTE — Progress Notes (Signed)
Data. Patient denies SI/HI/AVH. Patient has remained in bed throughout the day only getting up for medications. Meals were taken to him. Fluids were encouraged.  BP remains slightly el;evated. No C/O HA.  Does report detox symptoms of nausea, sweating and generally, "Feeling awful." Affect is flat and does not brighten. A & O x 4 through out shift. Action. Emotional support and encouragement offered. Education provided on medication, indications and side effect. Q 15 minute checks done for safety. Response. Safety on the unit maintained through 15 minute checks.  Medications taken as prescribed. Attended groups. Remained calm and appropriate through out shift.

## 2017-07-01 NOTE — Plan of Care (Signed)
Problem: Activity: Goal: Interest or engagement in activities will improve Outcome: Not Progressing Patient remains in bed except for medications. He did not go down to meals or activities today. Did eat in his room.

## 2017-07-01 NOTE — BHH Counselor (Signed)
Adult Comprehensive Assessment  Patient ID: Christopherjame Carnell, male   DOB: 1961-07-19, 56 y.o.   MRN: [id number]  Information Source:Patient    Current Stressors:  Educational / Learning stressors: NA Employment / Job issues: Concerned about possibly losing job Family Relationships: Strained with parents unless he is sober Museum/gallery curator / Lack of resources (include bankruptcy): [employer] / Lack of housing: Ecologist Physical health (include injuries & life threatening diseases): NA Social relationships: NA: later mentions he needs to stay away from many friends Substance abuse: Recent relapse of 2 months after 6 months clean on alcohol Bereavement / Loss: NA  Living/Environment/Situation:  Living Arrangements:  (Patient reports homelessness for last 3 months) Living conditions (as described by patient or guardian): Chaotic, temporary, sometimes feels dangerous and it's awfully dirty How long has patient lived in current situation?: 3 months What is atmosphere in current home: Chaotic, Temporary, Other (Comment)  Family History:  Marital status: Separated Separated, when?: "10 years maybe" What types of issues is patient dealing with in the relationship?: "She is in [place] and honestly my drinking is an issue" Additional relationship information: NA Are you sexually active?: No What is your sexual orientation?: Heterosexual; my wife really  Has your sexual activity been affected by drugs, alcohol, medication, or emotional stress?: "Maybe" Does patient have children?: Yes How many children?: 2 How is patient's relationship with their children?: Good relationship with my adult sons in [place]  Childhood History:  By whom was/is the patient raised?: Both parents Additional childhood history information: My parents were married as a child. "I never wanted for anything. They were hardworking and were wonderful parents." Description of patient's relationship with caregiver when  they were a child: I was very close to my parents as a child. We are a very close family. Patient's description of current relationship with people who raised him/her: Patient reports his parents now practice 'tough love' How were you disciplined when you got in trouble as a child/adolescent?: 'Never was much of a need for it' Does patient have siblings?: Yes Description of patient's current relationship with siblings: One older sister and one younger sister. I'm very close to both siblings. One sister lives in [place] and the other lives in [place].  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  Has patient ever been in the [employer]?: No Are There Guns or Other Weapons in [place]?: No  Financial Resources:   Financial resources: Income from employment, Food stamps Does patient have a representative payee or guardian?: No  Alcohol/Substance Abuse:   What has been your use of drugs/alcohol within the last 12 months?: Pt consumes four forty ounce beers daily Alcohol/Substance Abuse Treatment Hx: Past Tx, Inpatient, Past Tx, Outpatient, Past detox, Attends AA/NA If yes, describe treatment: [facility] 2009  [facility] 2013, [facility] Pester 2017 AA and [facility]; [facility] 2007, 2009, 2014, 2015, and  2018 Has alcohol/substance abuse ever caused legal problems?: Yes (No current charges)  Social Support System:   Patient's Community Support System: Poor Describe Community Support System: Parents and two sons Type of faith/religion: Darrick Meigs How does patient's faith help to cope with current illness?: Prayer helps as does Writer:   Leisure and Hobbies: I love working with my hands, wood working, Biomedical scientist, anything outside.   Strengths/Needs:   What things does the patient do well?: Good worker In what areas does patient struggle / problems for patient: Housing and Alcohol  Discharge Plan:   Does patient have access to transportation?: No Plan for no access to transportation at discharge: Bus Will patient be returning to same living situation after discharge?: No Plan for living situation after discharge: Struggle; asking about possible options; willing to call for [employer] Inter[employer]wees Currently receiving community mental health services: No If no, would patient like referral for services when discharged?: No Does patient have financial barriers related to discharge medications?: Yes Patient description of barriers related to discharge medications: Cost plus cost to take time to go to appointment  Summary/Recommendations:   Summary and Recommendations (to be completed by the evaluator): Patient is a 56 YO separated employed male who is identifying as male admitted with Major Depressive Disorder.  Stressors for patient include lack of stable housing since he left the [employer] three months ago; relapse of over two months, and disconnect from family when drinking.  Patient will benefit from crisis stabilization, medication evaluation, group therapy and psycho education, in addition to case management for discharge planning. At discharge it is recommended that  patient adhere to the established discharge plan and continue in treatment.  Sheilah Pigeon. 07/01/2017

## 2017-07-01 NOTE — BHH Group Notes (Signed)
[facility] Group Notes:  (Nursing/MHT/Case Management/Adjunct)  Date:  07/01/2017  Time:  2:11 PM  Type of Therapy:  Nurse Education  Participation Level:  Did Not Attend  Summary of Progress/Problems:  Activity where patients receive feedback on their positive attributes and are able to identify positive attributes in others.   Cheri Kearns 07/01/2017, 2:11 PM

## 2017-07-01 NOTE — Plan of Care (Signed)
Problem: Self-Concept: Goal: Level of anxiety will decrease Outcome: Progressing Patient reports feeling, "A little less anxious."

## 2017-07-01 NOTE — Plan of Care (Signed)
Problem: Medication: Goal: Compliance with prescribed medication regimen will improve Outcome: Progressing Taking medications as prescribed   

## 2017-07-01 NOTE — BHH Group Notes (Signed)
  [facility] LCSW Group Therapy Note    07/01/2017 10:10  To 11AM   Type of Therapy and Topic: Group Therapy: Feelings Around Returning Home & Establishing a Supportive Framework and Activity to Identify signs of Improvement or Decompensation   Participation Level: Minimal   Description of Group:  Patients first processed thoughts and feelings about up coming discharge. These included fears of upcoming changes, lack of change, new living environments, judgements and expectations from others and overall stigma of MH issues. We then discussed what is a supportive framework? What does it look like feel like and how do I discern it from and unhealthy non-supportive network? Learn how to cope when supports are not helpful and don't support you. Discuss what to do when your family/friends are not supportive.   Therapeutic Goals Addressed in Processing Group:  1. Patient will identify one healthy supportive network that they can use at discharge. 2. Patient will identify one factor of a supportive framework and how to tell it from an unhealthy network. 3. Patient able to identify one coping skill to use when they do not have positive supports from others. 4. Patient will demonstrate ability to communicate their needs through discussion and/or role plays.  Summary of Patient Progress:  Pt engaged minimally during group session. As patients processed their anxiety about discharge and described healthy supports patient  Shared concerns about getting basic needs met such as housing upon discharge. Patient was attentive to others   Therapeutic Modalities: Cognitive Behavioral Therapy Brief Therapy Motivational Interviewing  Sheilah Pigeon, LCSW

## 2017-07-01 NOTE — Progress Notes (Signed)
D.  Pt pleasant on approach, remains in bed.  Pt did not feel well enough to attend evening AA group.  Minimal interaction at this time, remained in room throughout shift.  Pt denies SI/HI/AVH at this time.  Continues to experience moderate withdrawal.  A.  Support and encouragement offered, medication given as ordered  R.  Pt remains safe on the unit, will continue to monitor.

## 2017-07-01 NOTE — Plan of Care (Signed)
Problem: Safety: Goal: Ability to remain free from injury will improve Outcome: Progressing No injury this shift.

## 2017-07-02 NOTE — Progress Notes (Signed)
[facility] MD Progress Note  07/02/2017 5:09 PM [name] [name]  MRN:  [id number] Subjective:   56 yo AAM, separated, homeless. Recently got out of [facility] about three months ago. Background history of AUD and Mood Disorder. Self presented to the ER with chest pain, blood is his stool. Patient reported depression with suicidal thoughts. Patient drinks daily and presented with BAL of 331 mg/dl. Significant labs includes sodium 130 meq/l and elevated AST. Other parameters were essentially normal. UDS was negative.  Chart reviewed today. Patient discussed at team today.  Staff reports that he is interacting more now. Coming out for groups. No behavioral issues. Has not reported any abnormal perception. Has not been observed to be internally stimulated. Future oriented and has not voiced any suicidal thoughts.    Seen today. Beginning to feel better. Eats better and sleeps well. Says his energy is getting better. Has regained interest in self care. Focused on staying sober and keeping his job. Hopes to get into sober living. No suicidal thoughts. No thoughts of violence. No evidence of psychosis. Denies any cravings for substances.   Principal Problem: AUD Diagnosis:   Patient Active Problem List   Diagnosis Date Noted  . Severe major depression ([place]) [F32.2] 06/30/2017  . Lateral epicondylitis of right elbow [M77.11] 01/05/2017  . Strain, MCP, hand, right, initial encounter [U13.244W] 01/05/2017  . 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 Bilateral knee pain [M25.561, M25.562] 01/11/2016  . Chronic pain of right wrist [M25.531, G89.29] 07/07/2015  . 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 Dislocation of right shoulder joint [S43.004A] 05/20/2015  . Tear of medial meniscus of right knee [S83.241A] 03/24/2015  . 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 Chronic cough [R05] 01/15/2015  . Onychomycosis of toenail [B35.1] 12/15/2014  . Seborrhea [L21.9] 11/11/2014  . GERD  (gastroesophageal reflux disease) [K21.9]   . Alcohol abuse [F10.10] 01/19/2014  . Generalized anxiety disorder [F41.1] 09/02/2013   Total Time spent with patient: 20 minutes  Past Psychiatric History: As in H&P  Past Medical History:  Past Medical History:  Diagnosis Date  . Alcohol abuse    Last Drink on Sep 2015  . Anxiety    About 56 years of age  . Carpal tunnel syndrome of right wrist Dx 2015  . Depression    At 56 years of age  . GERD (gastroesophageal reflux disease) Dx 2007  . Hepatitis, alcoholic, acute 1027  . Hyperlipidemia    borderline, diet controlled  . Pancreatitis Dx 2014  . Pneumonia 11/2014  . 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 Reflux Dx 2007  . 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 carpel tunnel release Right 07/28/2014    done in [place]   . ELBOW SURGERY    . KNEE ARTHROSCOPY    . KNEE SURGERY Left    15 years ago  . LIPOMA EXCISION N/A 05/10/2016   Procedure: EXCISION OF SCALP LIPOMA;  Surgeon: Johnathan Hausen, MD;  Location: [facility];  Service: General;  Laterality: N/A;   Family History:  Family History  Problem Relation Age of Onset  . Diabetes Father   . Hypertension Father   . Hypertension Paternal Grandfather   . Alcohol abuse Paternal Grandfather   . Alcohol abuse Maternal Grandfather   . Alcohol abuse Maternal Grandmother   . Alcohol abuse Paternal Grandmother   . Colon cancer Neg Hx   .  Rectal cancer Neg Hx   . Stomach cancer Neg Hx    Family Psychiatric  History: As in H&P Social History:  History  Alcohol Use  . 0.0 oz/week    Comment: rehab     History  Drug Use No    Comment: Patient denies     Social History   Social History  . Marital status: Legally Separated    Spouse name: N/A  . Number of children: N/A  . Years of education: N/A   Social History Main Topics  . Smoking status: Former Smoker    Types: Cigars  .  Smokeless tobacco: Never Used     Comment: smokes occasional black &mild  . Alcohol use 0.0 oz/week     Comment: rehab  . Drug use: No     Comment: Patient denies   . Sexual activity: Not Asked   Other Topics Concern  . None   Social History Narrative  . None   Additional Social History:        Sleep: Good  Appetite:  Better.   Current Medications: Current Facility-Administered Medications  Medication Dose Route Frequency Provider Last Rate Last Dose  . albuterol (PROVENTIL HFA;VENTOLIN HFA) 108 (90 Base) MCG/ACT inhaler 2 puff  2 puff Inhalation Q4H PRN Lindon Romp A, NP      . alum & mag hydroxide-simeth (MAALOX/MYLANTA) 200-200-20 MG/5ML suspension 30 mL  30 mL Oral Q4H PRN Lindon Romp A, NP      . escitalopram (LEXAPRO) tablet 20 mg  20 mg Oral Daily Lindon Romp A, NP   20 mg at 07/02/17 0824  . fluticasone (FLONASE) 50 MCG/ACT nasal spray 2 spray  2 spray Each Nare Daily Lindon Romp A, NP   2 spray at 07/02/17 [phone]  . folic acid (FOLVITE) tablet 1 mg  1 mg Oral Daily Lindon Romp A, NP   1 mg at 07/02/17 8657  . gabapentin (NEURONTIN) capsule 300 mg  300 mg Oral TID Cobos, Myer Peer, MD   300 mg at 07/02/17 1705  . hydrOXYzine (ATARAX/VISTARIL) tablet 25 mg  25 mg Oral Q6H PRN Lindon Romp A, NP   25 mg at 07/01/17 2244  . loperamide (IMODIUM) capsule 2-4 mg  2-4 mg Oral PRN Lindon Romp A, NP      . LORazepam (ATIVAN) tablet 1 mg  1 mg Oral Q6H PRN Lindon Romp A, NP      . LORazepam (ATIVAN) tablet 1 mg  1 mg Oral BID Lindon Romp A, NP   1 mg at 07/02/17 1705   Followed by  . [START ON 07/04/2017] LORazepam (ATIVAN) tablet 1 mg  1 mg Oral Daily Lindon Romp A, NP      . magnesium hydroxide (MILK OF MAGNESIA) suspension 30 mL  30 mL Oral Daily PRN Lindon Romp A, NP      . multivitamin with minerals tablet 1 tablet  1 tablet Oral Daily Lindon Romp A, NP   1 tablet at 07/02/17 0824  . naltrexone (DEPADE) tablet 50 mg  50 mg Oral Daily Izediuno, Laruth Bouchard, MD   50 mg  at 07/02/17 0825  . ondansetron (ZOFRAN-ODT) disintegrating tablet 4 mg  4 mg Oral Q6H PRN Lindon Romp A, NP   4 mg at 07/01/17 1203  . pantoprazole (PROTONIX) EC tablet 40 mg  40 mg Oral Daily Lindon Romp A, NP   40 mg at 07/02/17 8469  . thiamine (VITAMIN B-1) tablet 100 mg  100 mg Oral Daily  Lindon Romp A, NP   100 mg at 07/02/17 0824  . traZODone (DESYREL) tablet 50 mg  50 mg Oral QHS PRN Lindon Romp A, NP   50 mg at 07/01/17 2244    Lab Results:  No results found for this or any previous visit (from the past 48 hour(s)).  Blood Alcohol level:  Lab Results  Component Value Date   ETH 332 (HH) 06/29/2017   ETH 253 (H) 05/22/9484    Metabolic Disorder Labs: Lab Results  Component Value Date   HGBA1C 5.8 (H) 06/30/2017   MPG 120 06/30/2017   No results found for: PROLACTIN Lab Results  Component Value Date   CHOL 330 (H) 06/30/2017   TRIG 61 06/30/2017   HDL 196 06/30/2017   CHOLHDL 1.7 06/30/2017   VLDL 12 06/30/2017   LDLCALC 122 (H) 06/30/2017   LDLCALC 128 (H) 12/15/2014    Physical Findings: AIMS: Facial and Oral Movements Muscles of Facial Expression: None, normal Lips and Perioral Area: None, normal Jaw: None, normal Tongue: None, normal,Extremity Movements Upper (arms, wrists, hands, fingers): None, normal Lower (legs, knees, ankles, toes): None, normal, Trunk Movements Neck, shoulders, hips: None, normal, Overall Severity Severity of abnormal movements (highest score from questions above): None, normal Incapacitation due to abnormal movements: None, normal Patient's awareness of abnormal movements (rate only patient's report): No Awareness, Dental Status Current problems with teeth and/or dentures?: No Does patient usually wear dentures?: No  CIWA:  CIWA-Ar Total: 5 COWS:     Musculoskeletal: Strength & Muscle Tone: within normal limits Gait & Station: normal Patient leans: N/A  Psychiatric Specialty Exam: Physical Exam  Constitutional: He is  oriented to person, place, and time. He appears well-developed and well-nourished.  Eyes: Pupils are equal, round, and reactive to light.  Neck: Normal range of motion. Neck supple.  Respiratory: Effort normal.  Musculoskeletal: Normal range of motion.  Neurological: He is alert and oriented to person, place, and time.  Skin: Skin is warm and dry.  Psychiatric:  As above    ROS  Blood pressure 136/88, pulse 65, temperature 99.6 F (37.6 C), temperature source Oral, resp. rate 18, height 5\' 9"  (1.753 m), weight 62.6 kg (138 lb).Body mass index is 20.38 kg/m.  General Appearance: Neatly dressed. Just had his vitals taken. Pleasant and cooperative. Appropriate behavior.    Eye Contact:  Good  Speech: More spontaneous. Tone and rate are normalizing   Volume:  Slightly low  Mood:  Feeling better today.  Affect:  Mobilizing more affect.  Thought Process:  Linear  Orientation:  Full (Time, Place, and Person)  Thought Content:  Future oriented. No delusional theme. No preoccupation with violent thoughts. No negative ruminations. No obsession.  No hallucination in any modality.   Suicidal Thoughts:  No  Homicidal Thoughts:  No  Memory:  WNL  Judgement:  Better  Insight:  Good  Psychomotor Activity:  Better  Concentration:  Better  Recall:  Good  Fund of Knowledge:  Good  Language:  Good  Akathisia:  NA  Handed:    AIMS (if indicated):     Assets:  Communication Skills Desire for Improvement Resilience Vocational/Educational  ADL's:  Impaired  Cognition:  WNL  Sleep:  Number of Hours: 6     Treatment Plan Summary: Patient is coming off alcohol. Last dose of Benzodiazepines tomorrow. We are finalizing aftercare. Hopeful discharge by Wednesday.   Psychiatric: AUD Alcohol Induced Mood Disorder  Medical: Peripheral neuropathy GERD Sarcoidosis  Psychosocial:  Homelessness  Limited support  PLAN: 1. Continue current medication regimen 2. Continue to monitor mood,  behavior and interaction with peers    Artist Beach, MD 07/02/2017, 5:09 PMPatient ID: [name] [name], male   DOB: [birth year], 56 y.o.   MRN: [id number]

## 2017-07-02 NOTE — Tx Team (Signed)
Interdisciplinary Treatment and Diagnostic Plan Update  07/02/2017 Time of Session: 0830AM [name] [name] MRN: [id number]  Principal Diagnosis: MDD, recurrent, severe  Secondary Diagnoses: Active Problems:   Severe major depression (HCC)   Current Medications:  Current Facility-Administered Medications  Medication Dose Route Frequency Provider Last Rate Last Dose  . albuterol (PROVENTIL HFA;VENTOLIN HFA) 108 (90 Base) MCG/ACT inhaler 2 puff  2 puff Inhalation Q4H PRN Lindon Romp A, NP      . alum & mag hydroxide-simeth (MAALOX/MYLANTA) 200-200-20 MG/5ML suspension 30 mL  30 mL Oral Q4H PRN Lindon Romp A, NP      . escitalopram (LEXAPRO) tablet 20 mg  20 mg Oral Daily Lindon Romp A, NP   20 mg at 07/02/17 0824  . fluticasone (FLONASE) 50 MCG/ACT nasal spray 2 spray  2 spray Each Nare Daily Lindon Romp A, NP   2 spray at 07/02/17 [phone]  . folic acid (FOLVITE) tablet 1 mg  1 mg Oral Daily Lindon Romp A, NP   1 mg at 07/02/17 3710  . gabapentin (NEURONTIN) capsule 300 mg  300 mg Oral TID Cobos, Myer Peer, MD   300 mg at 07/02/17 1311  . hydrOXYzine (ATARAX/VISTARIL) tablet 25 mg  25 mg Oral Q6H PRN Lindon Romp A, NP   25 mg at 07/01/17 2244  . loperamide (IMODIUM) capsule 2-4 mg  2-4 mg Oral PRN Lindon Romp A, NP      . LORazepam (ATIVAN) tablet 1 mg  1 mg Oral Q6H PRN Lindon Romp A, NP      . LORazepam (ATIVAN) tablet 1 mg  1 mg Oral BID Rozetta Nunnery, NP       Followed by  . [START ON 07/04/2017] LORazepam (ATIVAN) tablet 1 mg  1 mg Oral Daily Lindon Romp A, NP      . magnesium hydroxide (MILK OF MAGNESIA) suspension 30 mL  30 mL Oral Daily PRN Lindon Romp A, NP      . multivitamin with minerals tablet 1 tablet  1 tablet Oral Daily Lindon Romp A, NP   1 tablet at 07/02/17 0824  . naltrexone (DEPADE) tablet 50 mg  50 mg Oral Daily Izediuno, Laruth Bouchard, MD   50 mg at 07/02/17 0825  . ondansetron (ZOFRAN-ODT) disintegrating tablet 4 mg  4 mg Oral Q6H PRN Lindon Romp A, NP   4 mg at  07/01/17 1203  . pantoprazole (PROTONIX) EC tablet 40 mg  40 mg Oral Daily Lindon Romp A, NP   40 mg at 07/02/17 6269  . thiamine (VITAMIN B-1) tablet 100 mg  100 mg Oral Daily Lindon Romp A, NP   100 mg at 07/02/17 0824  . traZODone (DESYREL) tablet 50 mg  50 mg Oral QHS PRN Lindon Romp A, NP   50 mg at 07/01/17 2244   PTA Medications: Prescriptions Prior to Admission  Medication Sig Dispense Refill Last Dose  . albuterol (PROVENTIL HFA;VENTOLIN HFA) 108 (90 Base) MCG/ACT inhaler Inhale 2 puffs into the lungs every 4 (four) hours as needed for wheezing or shortness of breath. (Patient not taking: Reported on 04/02/2017) 1 Inhaler 0 Not Taking at Unknown time  . albuterol (PROVENTIL HFA;VENTOLIN HFA) 108 (90 Base) MCG/ACT inhaler Inhale 1-2 puffs into the lungs every 6 (six) hours as needed for wheezing or shortness of breath. (Patient not taking: Reported on 06/18/2017) 1 Inhaler 0 Not Taking at Unknown time  . busPIRone (BUSPAR) 5 MG tablet Take 1 tablet (5 mg total) by mouth 2 (two)  times daily. (Patient not taking: Reported on 06/29/2017) 60 tablet 2 Not Taking at Unknown time  . chlordiazePOXIDE (LIBRIUM) 25 MG capsule Take by mouth 50 mg three times a day for 3 days, then 50 mg twice daily for 3 days, then 25 mg twice daily for 3 days, then 25 mg once daily for 3 days then STOP (Patient taking differently: Take 25-50 mg by mouth See admin instructions. Take 2 capsules (50 mg) by mouth three times a day for 3 days, then take 2 capsules (50 mg) twice daily for 3 days, then take 1 capsule (25 mg) twice daily for 3 days, then take 1 capsule (25 mg) once daily for 3 days, then STOP) 39 capsule 0 not yet started  . escitalopram (LEXAPRO) 20 MG tablet Take 1 tablet (20 mg total) by mouth daily. 30 tablet 5 06/28/2017 at Unknown time  . fluticasone (FLONASE) 50 MCG/ACT nasal spray Place 2 sprays into both nostrils daily. (Patient not taking: Reported on 06/29/2017) 16 g 5 Not Taking at Unknown time  .  fluticasone (FLONASE) 50 MCG/ACT nasal spray Place 2 sprays into both nostrils daily. (Patient not taking: Reported on 04/02/2017) 16 g 12 Not Taking at Unknown time  . folic acid (FOLVITE) 1 MG tablet Take 1 tablet (1 mg total) by mouth daily. 30 tablet 5 06/29/2017 at Unknown time  . ipratropium (ATROVENT) 0.06 % nasal spray Place 2 sprays into both nostrils 4 (four) times daily. (Patient not taking: Reported on 03/26/2017) 15 mL 12 Not Taking at Unknown time  . pantoprazole (PROTONIX) 40 MG tablet Take 1 tablet (40 mg total) by mouth daily. (Patient not taking: Reported on 06/29/2017) 30 tablet 3 Not Taking at Unknown time  . thiamine (VITAMIN B-1) 100 MG tablet Take 1 tablet (100 mg total) by mouth daily. (Patient not taking: Reported on 06/29/2017) 30 tablet 5 Not Taking at Unknown time  . Turmeric 500 MG CAPS Take 500 mg by mouth daily. (Patient not taking: Reported on 03/26/2017)   Not Taking at Unknown time    Patient Stressors: Health problems Medication change or noncompliance Occupational concerns Substance abuse  Patient Strengths: Ability for insight Communication skills General fund of knowledge Motivation for treatment/growth Supportive family/friends  Treatment Modalities: Medication Management, Group therapy, Case management,  1 to 1 session with clinician, Psychoeducation, Recreational therapy.   Physician Treatment Plan for Primary Diagnosis: MDD, recurrent, severe  Long Term Goal(s): Improvement in symptoms so as ready for discharge Improvement in symptoms so as ready for discharge   Short Term Goals: Ability to identify changes in lifestyle to reduce recurrence of condition will improve Ability to verbalize feelings will improve Ability to disclose and discuss suicidal ideas Ability to demonstrate self-control will improve Ability to identify and develop effective coping behaviors will improve Ability to maintain clinical measurements within normal limits will  improve Compliance with prescribed medications will improve Ability to identify triggers associated with substance abuse/mental health issues will improve Ability to identify changes in lifestyle to reduce recurrence of condition will improve Ability to verbalize feelings will improve Ability to disclose and discuss suicidal ideas Ability to demonstrate self-control will improve Ability to identify and develop effective coping behaviors will improve Ability to maintain clinical measurements within normal limits will improve Compliance with prescribed medications will improve Ability to identify triggers associated with substance abuse/mental health issues will improve  Medication Management: Evaluate patient's response, side effects, and tolerance of medication regimen.  Therapeutic Interventions: 1 to 1 sessions, Unit  Group sessions and Medication administration.  Evaluation of Outcomes: Progressing  Physician Treatment Plan for Secondary Diagnosis: Active Problems:   Severe major depression ([place])  Long Term Goal(s): Improvement in symptoms so as ready for discharge Improvement in symptoms so as ready for discharge   Short Term Goals: Ability to identify changes in lifestyle to reduce recurrence of condition will improve Ability to verbalize feelings will improve Ability to disclose and discuss suicidal ideas Ability to demonstrate self-control will improve Ability to identify and develop effective coping behaviors will improve Ability to maintain clinical measurements within normal limits will improve Compliance with prescribed medications will improve Ability to identify triggers associated with substance abuse/mental health issues will improve Ability to identify changes in lifestyle to reduce recurrence of condition will improve Ability to verbalize feelings will improve Ability to disclose and discuss suicidal ideas Ability to demonstrate self-control will improve Ability to  identify and develop effective coping behaviors will improve Ability to maintain clinical measurements within normal limits will improve Compliance with prescribed medications will improve Ability to identify triggers associated with substance abuse/mental health issues will improve     Medication Management: Evaluate patient's response, side effects, and tolerance of medication regimen.  Therapeutic Interventions: 1 to 1 sessions, Unit Group sessions and Medication administration.  Evaluation of Outcomes: Progressing   RN Treatment Plan for Primary Diagnosis:MDD, recurrent, severe  Long Term Goal(s): Knowledge of disease and therapeutic regimen to maintain health will improve  Short Term Goals: Ability to remain free from injury will improve, Ability to participate in decision making will improve and Ability to verbalize feelings will improve  Medication Management: RN will administer medications as ordered by provider, will assess and evaluate patient's response and provide education to patient for prescribed medication. RN will report any adverse and/or side effects to prescribing provider.  Therapeutic Interventions: 1 on 1 counseling sessions, Psychoeducation, Medication administration, Evaluate responses to treatment, Monitor vital signs and CBGs as ordered, Perform/monitor CIWA, COWS, AIMS and Fall Risk screenings as ordered, Perform wound care treatments as ordered.  Evaluation of Outcomes: Progressing   LCSW Treatment Plan for Primary Diagnosis: MDD, recurrent, severe  Long Term Goal(s): Safe transition to appropriate next level of care at discharge, Engage patient in therapeutic group addressing interpersonal concerns.  Short Term Goals: Engage patient in aftercare planning with referrals and resources, Increase ability to appropriately verbalize feelings and Increase skills for wellness and recovery  Therapeutic Interventions: Assess for all discharge needs, 1 to 1 time with  Social worker, Explore available resources and support systems, Assess for adequacy in community support network, Educate family and significant other(s) on suicide prevention, Complete Psychosocial Assessment, Interpersonal group therapy.  Evaluation of Outcomes: Progressing   Progress in Treatment: Attending groups: Yes. Participating in groups: Yes. Taking medication as prescribed: Yes. Toleration medication: Yes. Family/Significant other contact made: No, will contact:  family member if patient consents Patient understands diagnosis: Yes. Discussing patient identified problems/goals with staff: Yes. Medical problems stabilized or resolved: Yes. Denies suicidal/homicidal ideation: Yes. Issues/concerns per patient self-inventory: No. Other: n/a   New problem(s) identified: No, Describe:  n/a  New Short Term/Long Term Goal(s): detox, medication management for mood stabilization; development of comprehensive mental wellness/sobriety plan.   Discharge Plan or Barriers: CSW assessing. Pt plans to follow-up at [facility] for mental health services (medication management) and [facility] for peer support counseling. AA at night "after work."   Reason for Continuation of Hospitalization: Anxiety Depression Medication stabilization Suicidal ideation Withdrawal symptoms  Estimated Length  of Stay: Wed 07/04/17  Attendees: Patient: 07/02/2017 3:58 PM  Physician: Dr. Sanjuana Letters MD 07/02/2017 3:58 PM  Nursing: Selinda Eon RN; Dan RN 07/02/2017 3:58 PM  RN Care Manager: Lars Pinks CM 07/02/2017 3:58 PM  Social Worker: Maxie Better, LCSW 07/02/2017 3:58 PM  Recreational Therapist: x 07/02/2017 3:58 PM  Other: Lindell Spar NP; Ricky Ala NP; Darnelle Maffucci Money NP 07/02/2017 3:58 PM  Other:  07/02/2017 3:58 PM  Other: 07/02/2017 3:58 PM    Scribe for Treatment Team: [place], LCSW 07/02/2017 3:58 PM

## 2017-07-02 NOTE — Progress Notes (Signed)
D    Pt is quiet and cooperative   He has appropriate behaviors but he interacts minimally with others and tends to isolate   He is compliant with treatment    Pt denies any signs or symptoms of withdrawal A    Verbal support given   Medications administered and effectiveness monitored Q 15 min checks    Discussed withdrawal symptoms with patient R    Pt is safe and verbalized understanding of withdrawal symptoms

## 2017-07-02 NOTE — [facility] Group Notes (Signed)
Pt attended spiritual care group on grief and loss facilitated by chaplain Jerene Pitch   Group opened with brief discussion and psycho-social ed around grief and loss in relationships and in relation to self - identifying life patterns, circumstances, changes that cause losses. Established group norm of speaking from own life experience. Group goal of establishing open and affirming space for members to share loss and experience with grief, normalize grief experience and provide psycho social education and grief support.   Mykai was present throughout group.  Attentive to other group members and offered affirmation of other's descriptions of grief journey.  Coyle states he has been separated from his spouse for 11 years.  Describes each of them saying they will proceed with divorce when they have money.  Neither has ever pursued finalizing divorce.   Hewitt described not feeling stability in his life for a long time and has been searching for something stable to build on.  Started new job two months ago and is worried that his hospitalization will result in losing his job, and he must start over again.     [facility] / [place], MDiv

## 2017-07-02 NOTE — Progress Notes (Signed)
Patient ID: [name] [name], male   DOB: August 14, 1961, 56 y.o.   MRN: [id number]  Pt currently presents with a blunted affect and cooperative behavior.Pt states "I had a good day, I feel more energetic and clearer." Pt presents with withdrawal symptoms including tremors, lingering body aches and some anxiety. Pt reports good sleep with current medication regimen.   Pt provided with medications per providers orders. Pt's labs and vitals were monitored throughout the night. Pt given a 1:1 about emotional and mental status. Pt supported and encouraged to express concerns and questions. Pt educated on medications.  Pt's safety ensured with 15 minute and environmental checks. Pt currently denies SI/HI and A/V hallucinations. Pt verbally agrees to seek staff if SI/HI or A/VH occurs and to consult with staff before acting on any harmful thoughts. Will continue POC.

## 2017-07-02 NOTE — Progress Notes (Signed)
[facility] Group Notes:  (Nursing/MHT/Case Management/Adjunct)  Date:  07/02/2017  Time:  11:20 PM  Type of Therapy:  Psychoeducational Skills  Participation Level:  Active  Participation Quality:  Appropriate  Affect:  Appropriate  Cognitive:  Appropriate  Insight:  Improving  Engagement in Group:  Engaged  Modes of Intervention:  Education  Summary of Progress/Problems: Patient verbalized that he had a good day overall. He explained to the group that he had more energy today and that he felt "less cloudy". In terms of the theme for the day, his wellness strategy will be to attend A.A.meetings and return to work.   GOODMAN, BENJAMIN S 07/02/2017, 11:20 PM

## 2017-07-02 NOTE — Progress Notes (Signed)
CSW spoke with Carolee Rota [phone]) per patient request at his job ([employer]) to inform them of hospitalization. She will call in the morning with fax number to send hospital note. CSW attempted to contact Lynda Rainwater (employer) [phone] also, per pt request--voicemail full.   Maxie Better, MSW, LCSW Clinical Social Worker 07/02/2017 4:07 PM

## 2017-07-02 NOTE — Progress Notes (Signed)
Patient ID: [name] [name], male   DOB: 07-Feb-1961, 56 y.o.   MRN: [id number] D) Pt has been flat, anxious, cooperative on appropriate. Pt states that appetite and sleep are improving. Pt has been positive for all groups and activities with minimal prompting. Pt states that he wants to "get better". Denies s.i. A) level 3 obs for safety, support and encouragement provided, med ed reinforced, assess for etoh w/d s/s. R) Cooperative. Receptive.

## 2017-07-02 NOTE — BHH Group Notes (Signed)
[facility] LCSW Aftercare Discharge Planning Group Note   Date/time:  07/02/2017 9:30 AM  Type of Group and Topic: Psychoeducational Group:  Discharge Planning  Participation Level:  Active  Description of Group  Discharge planning group reviews patient's anticipated discharge plans and assists patients to anticipate and address any barriers to wellness/recovery in the community.  Suicide prevention education is reviewed with patients in group.  Therapeutic Goals 1. Patients will state their anticipated discharge plan and mental health aftercare 2. Patients will identify potential barriers to wellness in the community setting 3. Patients will engage in problem solving, solution focused discussion of ways to anticipate and address barriers to wellness/recovery  Summary of Patient Progress Patient concerned about housing at discharge, will be given shelter list.  Wants referral to residential substance abuse treatment facility.  May need help to call employer as he has been absent due to hospitalization.  Wants referral to [facility] and [facility] if possible.   Plan for Discharge/Comments:  Wants residential treatment if possible, has concerns about lack of housing.  Wants to   Transportation Means: Bus pass  Supports: Family  Therapeutic Modalities: Motivational Interviewing  Edwyna Shell, [place] Social Worker Phone:  [phone]

## 2017-07-02 NOTE — Progress Notes (Signed)
Recreation Therapy Notes  Date: 07/02/2017 Time: 9:30am Location: [address] Dayroom  Group Topic: Stress Management  Goal Area(s) Addresses:  Patient will verbalize importance of using healthy stress management.  Patient will identify positive emotions associated with healthy stress management.   Behavioral Response: Engaged  Intervention: Stress Management  Activity :  Guided Body Scan. Recreation Therapy Intern introduced the stress management technique of guided body scans. Recreation Therapy Intern played a YouTube video that allowed patients to focus on the tension built up in each part of their body. Patients were to follow along as script was read to engage in the activity.  Education: Stress Management, Discharge Planning.   Education Outcome: Acknowledges edcuation  Clinical Observations/Feedback: Pt attended group.  Donovan Kail, Recreation Therapy Intern   Victorino Sparrow, LRT/CTRS

## 2017-07-02 NOTE — BHH Group Notes (Signed)
[facility] LCSW Group Therapy  07/02/2017 4:07 PM  Type of Therapy:  Group Therapy  Participation Level:  Minimal  Participation Quality:  Attentive  Affect:  Appropriate  Cognitive:  Oriented  Insight:  Improving  Engagement in Therapy:  Lacking  Modes of Intervention:  Discussion, Education, Exploration, Socialization and Support  Summary of Progress/Problems: Today's Topic: Overcoming Obstacles. Patients identified one short term goal and potential obstacles in reaching this goal. Patients processed barriers involved in overcoming these obstacles. Patients identified steps necessary for overcoming these obstacles and explored motivation (internal and external) for facing these difficulties head on. Tobey was attentive during group and actively listened as others participated. Pt was quiet throughout group unless asked a direct question, but appeared calm and oriented. Pt shared that he was worried about losing job and asked CSW to contact his employer after group. Pt less anxious after being told this was possible.   [place] LCSW 07/02/2017, 4:07 PM

## 2017-07-03 DIAGNOSIS — F332 Major depressive disorder, recurrent severe without psychotic features: Secondary | ICD-10-CM

## 2017-07-03 DIAGNOSIS — G4700 Insomnia, unspecified: Secondary | ICD-10-CM

## 2017-07-03 DIAGNOSIS — F419 Anxiety disorder, unspecified: Secondary | ICD-10-CM

## 2017-07-03 DIAGNOSIS — F1910 Other psychoactive substance abuse, uncomplicated: Secondary | ICD-10-CM

## 2017-07-03 MED ORDER — LISINOPRIL 5 MG PO TABS
5.0000 mg | ORAL_TABLET | Freq: Every day | ORAL | Status: DC
  Administered 2017-07-04: 5 mg via ORAL
  Filled 2017-07-03 (×4): qty 1

## 2017-07-03 MED ORDER — IBUPROFEN 600 MG PO TABS
600.0000 mg | ORAL_TABLET | Freq: Three times a day (TID) | ORAL | Status: DC | PRN
  Administered 2017-07-03: 600 mg via ORAL
  Filled 2017-07-03: qty 1

## 2017-07-03 NOTE — Plan of Care (Signed)
Problem: Coping: Goal: Ability to demonstrate self-control will improve Outcome: Progressing Pt has been in control of his behavior tonight.

## 2017-07-03 NOTE — Plan of Care (Signed)
Problem: Physical Regulation: Goal: Complications related to the disease process, condition or treatment will be avoided or minimized Outcome: Progressing Pt reports decreased withdrawal symptoms today including tremors and fatigue

## 2017-07-03 NOTE — Progress Notes (Signed)
[facility] MD Progress Note  07/03/2017 12:51 PM [name] [name]  MRN:  [id number] Subjective:  [name] [name] reports " I am doing a lot better, I just need more information for long term housing."  Objective: [name] [name] is awake, alert and oriented. Reports " I feel ready to leave." patient has concerns with his after care. Reports he is going to a boarding house and is interested in a more permanent  living situation. Denies suicidal or homicidal ideation during this assessment . Denies auditory or visual hallucination and does not appear to be responding to internal stimuli. Patient interacts well with staff and others. Patient reports he is medication compliant without mediation side effects. Patient continues to deny craving or depression.Reports good appetite other wise and resting well.   Rn has concerns with elevated blood pressure 130/108. Spoke to patient who denies headaches, blurred vision or dizziness. Patient denies history of hypertension. Patient to follow-up with PCP. presumptive of detox from alcohol withdrawal continue to monitor. Support, encouragement and reassurance was provided.   Principal Problem: AUD Diagnosis:   Patient Active Problem List   Diagnosis Date Noted  . Severe major depression ([place]) [F32.2] 06/30/2017  . Lateral epicondylitis of right elbow [M77.11] 01/05/2017  . Strain, MCP, hand, right, initial encounter [H47.425Z] 01/05/2017  . 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 Bilateral knee pain [M25.561, M25.562] 01/11/2016  . Chronic pain of right wrist [M25.531, G89.29] 07/07/2015  . 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 Dislocation of right shoulder joint [S43.004A] 05/20/2015  . Tear of medial meniscus of right knee [S83.241A] 03/24/2015  . 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 Chronic cough [R05] 01/15/2015  . Onychomycosis of toenail [B35.1] 12/15/2014  . Seborrhea [L21.9] 11/11/2014  . GERD (gastroesophageal reflux disease) [K21.9]    . Alcohol abuse [F10.10] 01/19/2014  . Generalized anxiety disorder [F41.1] 09/02/2013   Total Time spent with patient: 20 minutes  Past Psychiatric History: As in H&P  Past Medical History:  Past Medical History:  Diagnosis Date  . Alcohol abuse    Last Drink on Sep 2015  . Anxiety    About 56 years of age  . Carpal tunnel syndrome of right wrist Dx 2015  . Depression    At 56 years of age  . GERD (gastroesophageal reflux disease) Dx 2007  . Hepatitis, alcoholic, acute 5638  . Hyperlipidemia    borderline, diet controlled  . Pancreatitis Dx 2014  . Pneumonia 11/2014  . 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 Reflux Dx 2007  . 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 carpel tunnel release Right 07/28/2014    done in [place]   . ELBOW SURGERY    . KNEE ARTHROSCOPY    . KNEE SURGERY Left    15 years ago  . LIPOMA EXCISION N/A 05/10/2016   Procedure: EXCISION OF SCALP LIPOMA;  Surgeon: Johnathan Hausen, MD;  Location: [facility];  Service: General;  Laterality: N/A;   Family History:  Family History  Problem Relation Age of Onset  . Diabetes Father   . Hypertension Father   . Hypertension Paternal Grandfather   . Alcohol abuse Paternal Grandfather   . Alcohol abuse Maternal Grandfather   . Alcohol abuse Maternal Grandmother   . Alcohol abuse Paternal Grandmother   . Colon cancer Neg Hx   . Rectal cancer Neg Hx   . Stomach cancer Neg Hx  Family Psychiatric  History: As in H&P Social History:  History  Alcohol Use  . 0.0 oz/week    Comment: rehab     History  Drug Use No    Comment: Patient denies     Social History   Social History  . Marital status: Legally Separated    Spouse name: N/A  . Number of children: N/A  . Years of education: N/A   Social History Main Topics  . Smoking status: Former Smoker    Types: Cigars  . Smokeless tobacco: Never Used     Comment:  smokes occasional black &mild  . Alcohol use 0.0 oz/week     Comment: rehab  . Drug use: No     Comment: Patient denies   . Sexual activity: Not Asked   Other Topics Concern  . None   Social History Narrative  . None   Additional Social History:        Sleep: Good  Appetite:  Better.   Current Medications: Current Facility-Administered Medications  Medication Dose Route Frequency Provider Last Rate Last Dose  . albuterol (PROVENTIL HFA;VENTOLIN HFA) 108 (90 Base) MCG/ACT inhaler 2 puff  2 puff Inhalation Q4H PRN Lindon Romp A, NP      . alum & mag hydroxide-simeth (MAALOX/MYLANTA) 200-200-20 MG/5ML suspension 30 mL  30 mL Oral Q4H PRN Lindon Romp A, NP      . escitalopram (LEXAPRO) tablet 20 mg  20 mg Oral Daily Lindon Romp A, NP   20 mg at 07/03/17 0825  . fluticasone (FLONASE) 50 MCG/ACT nasal spray 2 spray  2 spray Each Nare Daily Lindon Romp A, NP   2 spray at 07/03/17 0824  . folic acid (FOLVITE) tablet 1 mg  1 mg Oral Daily Lindon Romp A, NP   1 mg at 07/03/17 0825  . gabapentin (NEURONTIN) capsule 300 mg  300 mg Oral TID Cobos, Myer Peer, MD   300 mg at 07/03/17 1149  . [START ON 07/04/2017] LORazepam (ATIVAN) tablet 1 mg  1 mg Oral Daily Lindon Romp A, NP      . magnesium hydroxide (MILK OF MAGNESIA) suspension 30 mL  30 mL Oral Daily PRN Lindon Romp A, NP      . multivitamin with minerals tablet 1 tablet  1 tablet Oral Daily Lindon Romp A, NP   1 tablet at 07/03/17 0825  . naltrexone (DEPADE) tablet 50 mg  50 mg Oral Daily Izediuno, Laruth Bouchard, MD   50 mg at 07/03/17 0825  . pantoprazole (PROTONIX) EC tablet 40 mg  40 mg Oral Daily Lindon Romp A, NP   40 mg at 07/03/17 0825  . thiamine (VITAMIN B-1) tablet 100 mg  100 mg Oral Daily Lindon Romp A, NP   100 mg at 07/03/17 0825  . traZODone (DESYREL) tablet 50 mg  50 mg Oral QHS PRN Rozetta Nunnery, NP   50 mg at 07/02/17 2137    Lab Results:  No results found for this or any previous visit (from the past 48  hour(s)).  Blood Alcohol level:  Lab Results  Component Value Date   ETH 332 (HH) 06/29/2017   ETH 253 (H) 44/81/8563    Metabolic Disorder Labs: Lab Results  Component Value Date   HGBA1C 5.8 (H) 06/30/2017   MPG 120 06/30/2017   No results found for: PROLACTIN Lab Results  Component Value Date   CHOL 330 (H) 06/30/2017   TRIG 61 06/30/2017   HDL 196 06/30/2017  CHOLHDL 1.7 06/30/2017   VLDL 12 06/30/2017   LDLCALC 122 (H) 06/30/2017   LDLCALC 128 (H) 12/15/2014    Physical Findings: AIMS: Facial and Oral Movements Muscles of Facial Expression: None, normal Lips and Perioral Area: None, normal Jaw: None, normal Tongue: None, normal,Extremity Movements Upper (arms, wrists, hands, fingers): None, normal Lower (legs, knees, ankles, toes): None, normal, Trunk Movements Neck, shoulders, hips: None, normal, Overall Severity Severity of abnormal movements (highest score from questions above): None, normal Incapacitation due to abnormal movements: None, normal Patient's awareness of abnormal movements (rate only patient's report): No Awareness, Dental Status Current problems with teeth and/or dentures?: No Does patient usually wear dentures?: No  CIWA:  CIWA-Ar Total: 3 COWS:     Musculoskeletal: Strength & Muscle Tone: within normal limits Gait & Station: normal Patient leans: N/A  Psychiatric Specialty Exam: Physical Exam  Nursing note and vitals reviewed. Constitutional: He is oriented to person, place, and time. He appears well-developed and well-nourished.  Eyes: Pupils are equal, round, and reactive to light.  Respiratory: Effort normal.  Musculoskeletal: Normal range of motion.  Neurological: He is alert and oriented to person, place, and time.  Skin: Skin is warm and dry.  Psychiatric: He has a normal mood and affect. His behavior is normal.  As above    Review of Systems  Psychiatric/Behavioral: Positive for substance abuse. Negative for depression  (stable). The patient is nervous/anxious and has insomnia (stable).     Blood pressure (!) 139/101, pulse 77, temperature 98 F (36.7 C), temperature source Oral, resp. rate 18, height 5\' 9"  (1.753 m), weight 62.6 kg (138 lb).Body mass index is 20.38 kg/m.  General Appearance: Causal, smiling and appropriate   Eye Contact:  Good  Speech: More spontaneous.   Volume:  normal mild fluctuations  Mood:   congruent   Affect:  Reactive and pleasant   Thought Process:  Linear  Orientation:  Full (Time, Place, and Person)  Thought Content:  Future oriented. No hallucination reported  Suicidal Thoughts:  No  Homicidal Thoughts:  No  Memory:  WNL  Judgement:  Better  Insight:  Good  Psychomotor Activity:  Better  Concentration:  Better  Recall:  Good  Fund of Knowledge:  Good  Language:  Good  Akathisia:  NA  Handed:    AIMS (if indicated):     Assets:  Desire for Improvement Resilience Vocational/Educational  ADL's:  Impaired  Cognition:  WNL  Sleep:  Number of Hours: 6.75     I agree with current treatment plan on 07/03/2017, Patient seen face-to-face for psychiatric evaluation follow-up, chart reviewed. Reviewed the information documented and agree with the treatment plan.  Treatment Plan Summary: Daily contact with patient to assess and evaluate symptoms and progress in treatment and Medication management  Continue with Lexapro 20mg  and Depade 50mg   for mood stabilization. Continue with Trazodone 100 mg for insomnia Started on CWIA/Ativan Protocol Will continue to monitor vitals ,medication compliance and treatment side effects while patient is here.  Reviewed labs,BAL - 332, UDS -   CSW will start working on disposition.  Patient to participate in therapeutic milieu    Derrill [place], NP 07/03/2017, 12:51 PM   Agree with NP Progress Note

## 2017-07-03 NOTE — BHH Group Notes (Signed)
[place] [facility] LCSW Group Therapy  07/03/2017 2:02 PM  Type of Therapy:  Group Therapy  Participation Level:  Active  Participation Quality:  Attentive  Affect:  Appropriate  Cognitive:  Alert and Oriented  Insight:  Engaged  Engagement in Therapy:  Improving  Modes of Intervention:  Confrontation, Discussion, Education, Socialization and Support  Summary of Progress/Problems: MHA Speaker came to talk about his personal journey with substance abuse and addiction. The pt processed ways by which to relate to the speaker. Gantt speaker provided handouts and educational information pertaining to groups and services offered by the [facility].   [place] LCSW 07/03/2017, 2:02 PM

## 2017-07-03 NOTE — Progress Notes (Signed)
D: Pt was in the dayroom upon initial approach.  Pt presents with appropriate affect and mood.  Describes his day as "good" and reports goal is "just trying to stay focused, take my meds."  Pt reports he is discharging tomorrow and he feels safe to do so.  He states "I feel so much better than when I first came in."  Pt denies SI/HI, denies hallucinations, denies pain.  Pt has been visible in milieu interacting with peers and staff appropriately.  Pt attended evening group.    A: Introduced self to pt.  Actively listened to pt and offered support and encouragement. PRN medication administered for sleep.  Q15 minute safety checks maintained.  R: Pt is safe on the unit.  Pt is compliant with medication.  Pt verbally contracts for safety.  Will continue to monitor and assess.

## 2017-07-03 NOTE — Progress Notes (Signed)
Patient ID: [name] [name], male   DOB: 11-11-1961, 56 y.o.   MRN: [id number]  DAR: Pt. Denies SI/HI and A/V Hallucinations. He reports sleep is good, appetite is fair, energy level is normal, and concentration is good. He rates depression 0/10, hopelessness 0/10, and anxiety 5/10. Patient does report a headache this afternoon and received PRN Ibuprofen which provided some relief. His BP is elevated and was offered Lisinopril which patient refused. Writer encouraged him to think about taking this medication because his BP has remained elevated during this admission. Scheduled medications are otherwise administered to patient per physician's orders. Patient is seen in the milieu and is interacting with some of his peers. He is attending groups and is participating in his plan of care at this time. Q15 minute checks are maintained for safety.

## 2017-07-03 NOTE — BHH Group Notes (Signed)
[facility] Group Notes:  Recovery   Date:  07/03/2017  Time:  10:26 AM  Type of Therapy:  Psychoeducational Skills  Participation Level:  Active  Participation Quality:  Appropriate and Attentive  Affect:  Anxious  Cognitive:  Appropriate  Insight:  Limited  Engagement in Group:  Engaged  Modes of Intervention:  Discussion and Education  Summary of Progress/Problems: Patient attended group and was engaged throughout.   Dopson, Christa E 07/03/2017, 10:26 AM

## 2017-07-03 NOTE — Progress Notes (Signed)
Adult Psychoeducational Group Note  Date:  07/03/2017 Time:  11:25 PM  Group Topic/Focus:  Wrap-Up Group:   The focus of this group is to help patients review their daily goal of treatment and discuss progress on daily workbooks.  Participation Level:  Active  Participation Quality:  Appropriate  Affect:  Appropriate  Cognitive:  Alert  Insight: Appropriate  Engagement in Group:  Engaged  Modes of Intervention:  Discussion  Additional Comments:  Pt stated that he has being feeling good today. He has been eating and sleeping well. His goal is to prepare for discharge and get ready to go back to work.   Wynelle Fanny R 07/03/2017, 11:25 PM

## 2017-07-04 ENCOUNTER — Encounter (HOSPITAL_COMMUNITY): Payer: Self-pay | Admitting: Behavioral Health

## 2017-07-04 DIAGNOSIS — F322 Major depressive disorder, single episode, severe without psychotic features: Principal | ICD-10-CM

## 2017-07-04 DIAGNOSIS — Z87891 Personal history of nicotine dependence: Secondary | ICD-10-CM

## 2017-07-04 DIAGNOSIS — R079 Chest pain, unspecified: Secondary | ICD-10-CM

## 2017-07-04 DIAGNOSIS — Z811 Family history of alcohol abuse and dependence: Secondary | ICD-10-CM

## 2017-07-04 DIAGNOSIS — F39 Unspecified mood [affective] disorder: Secondary | ICD-10-CM

## 2017-07-04 DIAGNOSIS — F1099 Alcohol use, unspecified with unspecified alcohol-induced disorder: Secondary | ICD-10-CM

## 2017-07-04 DIAGNOSIS — K921 Melena: Secondary | ICD-10-CM

## 2017-07-04 DIAGNOSIS — Z560 Unemployment, unspecified: Secondary | ICD-10-CM

## 2017-07-04 DIAGNOSIS — R45851 Suicidal ideations: Secondary | ICD-10-CM

## 2017-07-04 DIAGNOSIS — Z818 Family history of other mental and behavioral disorders: Secondary | ICD-10-CM

## 2017-07-04 DIAGNOSIS — Z590 Homelessness: Secondary | ICD-10-CM

## 2017-07-04 MED ORDER — TRAZODONE HCL 50 MG PO TABS: 50.0000 mg | ORAL_TABLET | Freq: Every evening | ORAL | 0 refills | Status: DC | PRN

## 2017-07-04 MED ORDER — GABAPENTIN 300 MG PO CAPS: 300.0000 mg | ORAL_CAPSULE | Freq: Three times a day (TID) | ORAL | 0 refills | Status: DC

## 2017-07-04 MED ORDER — LISINOPRIL 5 MG PO TABS: 5.0000 mg | ORAL_TABLET | Freq: Every day | ORAL | 0 refills | Status: DC

## 2017-07-04 MED ORDER — THIAMINE HCL 100 MG PO TABS: 100.0000 mg | ORAL_TABLET | Freq: Every day | ORAL | 0 refills | Status: DC

## 2017-07-04 MED ORDER — NALTREXONE HCL 50 MG PO TABS: 50.0000 mg | ORAL_TABLET | Freq: Every day | ORAL | 0 refills | Status: DC

## 2017-07-04 MED ORDER — PANTOPRAZOLE SODIUM 40 MG PO TBEC: 40.0000 mg | DELAYED_RELEASE_TABLET | Freq: Every day | ORAL | 0 refills | Status: DC

## 2017-07-04 MED ORDER — ESCITALOPRAM OXALATE 20 MG PO TABS: 20.0000 mg | ORAL_TABLET | Freq: Every day | ORAL | 0 refills | Status: DC

## 2017-07-04 NOTE — BHH Suicide Risk Assessment (Signed)
[facility] Discharge Suicide Risk Assessment   Principal Problem: Severe major depression [facility]) Discharge Diagnoses:  Patient Active Problem List   Diagnosis Date Noted  . Severe major depression ([place]) [F32.2] 06/30/2017  . Lateral epicondylitis of right elbow [M77.11] 01/05/2017  . Strain, MCP, hand, right, initial encounter [L24.401U] 01/05/2017  . 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 Bilateral knee pain [M25.561, M25.562] 01/11/2016  . Chronic pain of right wrist [M25.531, G89.29] 07/07/2015  . 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 Dislocation of right shoulder joint [S43.004A] 05/20/2015  . Tear of medial meniscus of right knee [S83.241A] 03/24/2015  . 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 Chronic cough [R05] 01/15/2015  . Onychomycosis of toenail [B35.1] 12/15/2014  . Seborrhea [L21.9] 11/11/2014  . GERD (gastroesophageal reflux disease) [K21.9]   . Alcohol abuse [F10.10] 01/19/2014  . Generalized anxiety disorder [F41.1] 09/02/2013    Total Time spent with patient: 30 minutes  Musculoskeletal: Strength & Muscle Tone: within normal limits Gait & Station: normal Patient leans: N/A  Psychiatric Specialty Exam: ROS no headache, no chest pain, no shortness of breath, no vomiting   Blood pressure (!) 128/93, pulse 80, temperature 97.8 F (36.6 C), temperature source Oral, resp. rate 16, height 5\' 9"  (1.753 m), weight 62.6 kg (138 lb).Body mass index is 20.38 kg/m.  General Appearance: Well Groomed  Eye Contact::  Good  Speech:  Normal Rate409  Volume:  Normal  Mood:  reports mood is much improved, denies feeling depressed or sad at this time  Affect:  Appropriate  Thought Process:  Linear and Descriptions of Associations: Intact  Orientation:  Other:  fully alert and attentive   Thought Content:  denies hallucinations, no delusions, not internally preoccupied   Suicidal Thoughts:  No denies any suicidal or self injurious ideations, denies  any homicidal or violent ideations  Homicidal Thoughts:  No  Memory:  recent and remote grossly intact  Judgement:  Other:  improving   Insight:  improving   Psychomotor Activity:  Normal, minimal distal tremors, no psychomotor agitation or restlessness   Concentration:  Good  Recall:  Good  Fund of Knowledge:Good  Language: Good  Akathisia:  Negative  Handed:  Right  AIMS (if indicated):     Assets:  Communication Skills Desire for Improvement Resilience  Sleep:  Number of Hours: 6.25  Cognition: WNL  ADL's:  Intact   Mental Status Per Nursing Assessment::   On Admission:  Self-harm thoughts  Demographic Factors:  56 year old patient , homeless, separated   Loss Factors: Homelessness   Historical Factors: Alcohol Dependence , depression  Risk Reduction Factors:   Sense of responsibility to family and Positive coping skills or problem solving skills  Continued Clinical Symptoms:  At this time patient is alert, attentive . Presents calm, pleasant, in no acute distress. No current symptoms of alcohol withdrawal. Reports mood as improved and denies feeling depressed at this time. Affect is appropriate, reactive. Denies suicidal ideations, denies self injurious ideations, no homicidal or violent ideations, no hallucinations, no delusions , future oriented . Denies medication side effects. Presents calm, pleasant on approach.   Cognitive Features That Contribute To Risk:  No gross cognitive deficits noted upon discharge. Is alert , attentive, and oriented x 3     Suicide Risk:  Mild:  Suicidal ideation of limited frequency, intensity, duration, and specificity.  There are no identifiable plans, no associated intent, mild dysphoria and related symptoms, good self-control (both objective and subjective assessment),  few other risk factors, and identifiable protective factors, including available and accessible social support.  Follow-up Information    [facility] Follow up on  07/11/2017.   Specialty:  Behavioral Health Why:  Appt on this date at 9:00AM. Please arrive by 8:45AM to this appt and bring hospital discharge paperwork with you. Thank you. Contact information: [place] [address] [phone]           Plan Of Care/Follow-up recommendations:  Activity:  as tolerated  Diet:  Regular Tests:  NA Other:  See below Patient expresses readiness for discharge- leaving unit in good spirits Plans to live in rooming house after discharge Plans to follow up as above  Encouraged to go to [employer] meetings and to avoid people, places and things he associates with alcohol consumption to minimize risk of relapse .  Jenne Campus, MD 07/04/2017, 11:55 AM

## 2017-07-04 NOTE — Discharge Summary (Signed)
Physician Discharge Summary Note  Patient:  [name] [name] is an 56 y.o., male MRN:  [id number] DOB:  06-09-1961 Patient phone:  [phone] (home)  Patient address:   [address] [place] [address],  Total Time spent with patient: 30 minutes  Date of Admission:  06/29/2017 Date of Discharge: 07/04/2017  Reason for Admission: depression, SI, SUD   Principal Problem: Severe major depression [facility]) Discharge Diagnoses: Patient Active Problem List   Diagnosis Date Noted  . Severe major depression ([place]) [F32.2] 06/30/2017  . Lateral epicondylitis of right elbow [M77.11] 01/05/2017  . Strain, MCP, hand, right, initial encounter [G29.528U] 01/05/2017  . 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 Bilateral knee pain [M25.561, M25.562] 01/11/2016  . Chronic pain of right wrist [M25.531, G89.29] 07/07/2015  . 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 Dislocation of right shoulder joint [S43.004A] 05/20/2015  . Tear of medial meniscus of right knee [S83.241A] 03/24/2015  . 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 Chronic cough [R05] 01/15/2015  . Onychomycosis of toenail [B35.1] 12/15/2014  . Seborrhea [L21.9] 11/11/2014  . GERD (gastroesophageal reflux disease) [K21.9]   . Alcohol abuse [F10.10] 01/19/2014  . Generalized anxiety disorder [F41.1] 09/02/2013   AUD, Mood disorder. He was admitted once years ago. Says it was under similar circumstances. He has been in detox over seven times. He has been in rehab twice. He just got out of rehab about three months ago. He is followed by his PCP. He is prescribed Lexapro 20 mg daily. Buspirone was recently added. Patient reports suicidal thoughts while intoxicated. Says he has never attempted suicide in the past. No past history of violent behavior.  Past Psychiatric History:   Past Medical History:  Past Medical History:  Diagnosis Date  . Alcohol abuse    Last Drink on Sep 2015  . Anxiety    About 56 years of  age  . Carpal tunnel syndrome of right wrist Dx 2015  . Depression    At 56 years of age  . GERD (gastroesophageal reflux disease) Dx 2007  . Hepatitis, alcoholic, acute 1324  . Hyperlipidemia    borderline, diet controlled  . Pancreatitis Dx 2014  . Pneumonia 11/2014  . 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 Reflux Dx 2007  . 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 carpel tunnel release Right 07/28/2014    done in [place]   . ELBOW SURGERY    . KNEE ARTHROSCOPY    . KNEE SURGERY Left    15 years ago  . LIPOMA EXCISION N/A 05/10/2016   Procedure: EXCISION OF SCALP LIPOMA;  Surgeon: Johnathan Hausen, MD;  Location: [place];  Service: General;  Laterality: N/A;   Family History:  Family History  Problem Relation Age of Onset  . Diabetes Father   . Hypertension Father   . Hypertension Paternal Grandfather   . Alcohol abuse Paternal Grandfather   . Alcohol abuse Maternal Grandfather   . Alcohol abuse Maternal Grandmother   . Alcohol abuse Paternal Grandmother   . Colon cancer Neg Hx   . Rectal cancer Neg Hx   . Stomach cancer Neg Hx    Family Psychiatric  History: Family history of SUD in both sides of his family. No family history of any major mental illness. No family  History of suicide Social History:  History  Alcohol Use  . 0.0 oz/week    Comment:  rehab     History  Drug Use No    Comment: Patient denies     Social History   Social History  . Marital status: Legally Separated    Spouse name: N/A  . Number of children: N/A  . Years of education: N/A   Social History Main Topics  . Smoking status: Former Smoker    Types: Cigars  . Smokeless tobacco: Never Used     Comment: smokes occasional black &mild  . Alcohol use 0.0 oz/week     Comment: rehab  . Drug use: No     Comment: Patient denies   . Sexual activity: Not Asked   Other Topics Concern  . None   Social  History Narrative  . None    Hospital Course:  56 yo AAM, separated, homeless. Recently got out of [facility] about three months ago. Background history of AUD and Mood Disorder. Self presented to the ER with chest pain, blood is his stool. Patient reported depression with suicidal thoughts. Patient drinks daily and presented with BAL of 331 mg/dl. Significant labs includes sodium 130 meq/l and elevated AST. Other parameters were essentially normal. UDS was negative.  At interview, he reports daily pattern of drinking. Drinks over 160 ounce of beer daily. Says typically he does not eat or drink water once he is drinking alcohol. Reports medical issues related to alcohol use " vomiting ,,,, I cannot hold down food ,,,, I feel a bit sore in my abdomen ,,,, I have pins and needles in my feet ,,,,, I feel anxious most of the time". Recently noticed blood in his stool. No coffee stained vomitus. No dark stool. Abdominal pain is not excruciating and not radiating to his back.  Patient states that he has become very overwhelmed since he came out of [place]. 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 Says he is worried his alcohol use would cost him his job. Says he has to take three buses before he makes it to work. Lost his truck over a year ago. Says transportation and homelessness are his main stressors. Tells me that he tends to get anxious and not necessarily depressed. Sleep wake cycle is messed up by alcohol. Says he gets into sleep while intoxicated but wakes very early in the morning. Little motivation to do things other than seek and use alcohol. No other interest, no other social outlet. Says his two children are supportive. No associated sweatiness, no headaches. No fullness in the head. No visual, tactile or auditory hallucination. No internal restlessness. No current suicidal or homicidal thoughts. No thoughts of violence. No overwhelming anxiety. No access to weapons. Says he  wants to get help.   After the above admission assessment and during this hospital course, patients presenting symptoms were identified.he presented with a history of alcohol abuse. Detoxification treatments administered as approproiate. Patient was treated and discharged with the following medications;  Lexapro 20mg  and Depade 50mg   for mood stabilization. Continue with Trazodone 100 mg for insomnia, lisinopril 5 mg po daily for HTN, Protonix EC 40 mg po daily for GERD, Thiamine 100 mg po daily for alcohol abuse, folic acid for folate deficiency and or other home medications as noted below.Patient tolerated his treatment regimen without any adverse effects reported. He remained compliant with therapeutic milieu and actively participated in group counseling sessions. AA/NA meetings were offered & held on the unit with patients active participation. While on the unit, patient was able to  verbalize learned coping skills for better management of depression and suicidal thoughts and to better maintain these thoughts and symptoms when returning home.  During the course of his hospitalization,  improvement of patients condition was monitored by observation and patients daily report of symptom reduction, presentation of good affect, and overall improvement in mood & behavior.Upon discharge, patient denied any SI/HI, AVH, delusional thoughts, or paranoia. He endorsed overall improvement in anxiety. She denied  any substance withdrawal symptoms.  Prior to discharge, Marl's case was presented during treatment team meeting this morning. The team members were all in agreement that Uriah was both mentally & medically stable to be discharged to continue mental health care on an outpatient basis as noted below. He was provided with all the necessary information needed to make this appointment without problems.He was provided with prescriptions  of his [facility] discharge medications to be taken to her pharmacy as well as a 7 day  supply of samples. He left [facility] with all personal belongings in no apparent distress. Transportation per patients arrangement.  Physical Findings: AIMS: Facial and Oral Movements Muscles of Facial Expression: None, normal Lips and Perioral Area: None, normal Jaw: None, normal Tongue: None, normal,Extremity Movements Upper (arms, wrists, hands, fingers): None, normal Lower (legs, knees, ankles, toes): None, normal, Trunk Movements Neck, shoulders, hips: None, normal, Overall Severity Severity of abnormal movements (highest score from questions above): None, normal Incapacitation due to abnormal movements: None, normal Patient's awareness of abnormal movements (rate only patient's report): No Awareness, Dental Status Current problems with teeth and/or dentures?: No Does patient usually wear dentures?: No  CIWA:  CIWA-Ar Total: 2 COWS:     Musculoskeletal: Strength & Muscle Tone: within normal limits Gait & Station: normal Patient leans: N/A  Psychiatric Specialty Exam: SEE SRA BY MD  Physical Exam  Nursing note and vitals reviewed. Constitutional: He is oriented to person, place, and time.  Neurological: He is alert and oriented to person, place, and time.    Review of Systems  Psychiatric/Behavioral: Positive for substance abuse (Hx of substance abuse ). Negative for hallucinations, memory loss and suicidal ideas. Depression: improved. Nervous/anxious: improved. Insomnia: improved.   All other systems reviewed and are negative.   Blood pressure (!) 128/93, pulse 80, temperature 97.8 F (36.6 C), temperature source Oral, resp. rate 16, height 5\' 9"  (1.753 m), weight 138 lb (62.6 kg).Body mass index is 20.38 kg/m.   Have you used any form of tobacco in the last 30 days? (Cigarettes, Smokeless Tobacco, Cigars, and/or Pipes): No  Has this patient used any form of tobacco in the last 30 days? (Cigarettes, Smokeless Tobacco, Cigars, and/or Pipes)  N/A  Blood Alcohol level:  Lab  Results  Component Value Date   ETH 332 (HH) 06/29/2017   ETH 253 (H) 82/99/3716    Metabolic Disorder Labs:  Lab Results  Component Value Date   HGBA1C 5.8 (H) 06/30/2017   MPG 120 06/30/2017   No results found for: PROLACTIN Lab Results  Component Value Date   CHOL 330 (H) 06/30/2017   TRIG 61 06/30/2017   HDL 196 06/30/2017   CHOLHDL 1.7 06/30/2017   VLDL 12 06/30/2017   LDLCALC 122 (H) 06/30/2017   LDLCALC 128 (H) 12/15/2014    See Psychiatric Specialty Exam and Suicide Risk Assessment completed by Attending Physician prior to discharge.  Discharge destination:  Home  Is patient on multiple antipsychotic therapies at discharge:  No   Has Patient had three or more failed trials of antipsychotic  monotherapy by history:  No  Recommended Plan for Multiple Antipsychotic Therapies: NA   Allergies as of 07/04/2017      Reactions   Oxycodone Other (See Comments)   abd pain, stomach cramps       Medication List    STOP taking these medications   busPIRone 5 MG tablet Commonly known as:  BUSPAR   chlordiazePOXIDE 25 MG capsule Commonly known as:  LIBRIUM   fluticasone 50 MCG/ACT nasal spray Commonly known as:  FLONASE   ipratropium 0.06 % nasal spray Commonly known as:  ATROVENT   Turmeric 500 MG Caps     TAKE these medications     Indication  albuterol 108 (90 Base) MCG/ACT inhaler Commonly known as:  PROVENTIL HFA;VENTOLIN HFA Inhale 2 puffs into the lungs every 4 (four) hours as needed for wheezing or shortness of breath. What changed:  Another medication with the same name was removed. Continue taking this medication, and follow the directions you see here.  Indication:  shortness of breath   escitalopram 20 MG tablet Commonly known as:  LEXAPRO Take 1 tablet (20 mg total) by mouth daily.  Indication:  Major Depressive Disorder   folic acid 1 MG tablet Commonly known as:  FOLVITE Take 1 tablet (1 mg total) by mouth daily.  Indication:  folate  deficiency   gabapentin 300 MG capsule Commonly known as:  NEURONTIN Take 1 capsule (300 mg total) by mouth 3 (three) times daily.  Indication:  anxiety/agitation   lisinopril 5 MG tablet Commonly known as:  PRINIVIL,ZESTRIL Take 1 tablet (5 mg total) by mouth daily.  Indication:  High Blood Pressure Disorder   naltrexone 50 MG tablet Commonly known as:  DEPADE Take 1 tablet (50 mg total) by mouth daily.  Indication:  Excessive Use of Alcohol   pantoprazole 40 MG tablet Commonly known as:  PROTONIX Take 1 tablet (40 mg total) by mouth daily.  Indication:  Gastroesophageal Reflux Disease   thiamine 100 MG tablet Take 1 tablet (100 mg total) by mouth daily.  Indication:  Deficiency in Thiamine or Vitamin B1, alcohol disorder   traZODone 50 MG tablet Commonly known as:  DESYREL Take 1 tablet (50 mg total) by mouth at bedtime as needed for sleep.  Indication:  Trouble Sleeping      Follow-up Information    [facility] Follow up on 07/11/2017.   Specialty:  Behavioral Health Why:  Appt on this date at 9:00AM. Please arrive by 8:45AM to this appt and bring hospital discharge paperwork with you. Thank you. Contact information: [place] [address] [phone]           Follow-up recommendations: Follow up with your outpatient provided for any medical issues. Activity & diet as recommended by your primary care provider.  Comments:  Patient is instructed prior to discharge to: Take all medications as prescribed by his/her mental healthcare provider. Report any adverse effects and or reactions from the medicines to his/her outpatient provider promptly. Patient has been instructed & cautioned: To not engage in alcohol and or illegal drug use while on prescription medicines. In the event of worsening symptoms, patient is instructed to call the crisis hotline, 911 and or go to the nearest ED for appropriate evaluation and treatment of symptoms. To follow-up with  his/her primary care provider for your other medical issues, concerns and or health care needs.  Signed: Mordecai Maes, NP 07/04/2017, 9:58 AM  Patient seen, Suicide Assessment Completed.  Disposition Plan Reviewed

## 2017-07-04 NOTE — Progress Notes (Signed)
  [facility] Adult Case Management Discharge Plan :  Will you be returning to the same living situation after discharge:  Yes,  home At discharge, do you have transportation home?: Yes,  bus pass/PART bus provided Do you have the ability to pay for your medications: Yes,  mental health  Release of information consent forms completed and submitted to medical records by CSW.  Patient to Follow up at: Follow-up Information    [facility] Follow up on 07/11/2017.   Specialty:  Behavioral Health Why:  Appt on this date at 9:00AM. Please arrive by 8:45AM to this appt and bring hospital discharge paperwork with you. Thank you. Contact information: [place] [address] [phone]           Next level of care provider has access to [place] and Suicide Prevention discussed: Yes,  SPE completed with pt; pt declined to consent to family contact. SPI pamphlet and Mobile Crisis information provided.  Have you used any form of tobacco in the last 30 days? (Cigarettes, Smokeless Tobacco, Cigars, and/or Pipes): No  Has patient been referred to the Quitline?: N/A patient is not a smoker  Patient has been referred for addiction treatment: Yes  [employer], LCSW 07/04/2017, 9:07 AM

## 2017-07-04 NOTE — Progress Notes (Signed)
Recreation Therapy Notes  Date: 07/04/2017 Time: 9:30am Location: 300 [address] Dayroom  Group Topic: Stress Management  Goal Area(s) Addresses:  Patient will verbalize importance of using healthy stress management.  Patient will identify positive emotions associated with healthy stress management.   Behavioral Response: Engaged  Intervention: Stress Management  Activity : Guided Designer, industrial/product. Recreation Therapy Intern introduced the stress management technique of a guided energy starter. Recreation Therapy Intern read a script that allowed patients to work on stretching and relaxing some muscles to help them feel energized. Recreation Therapy Intern played calming music. Patients were to follow along as script was read to engage in the activity.  Education: Stress Management, Discharge Planning.   Education Outcome: Acknowledges edcuation  Clinical Observations/Feedback: Pt attended group.  Donovan Kail, Recreation Therapy Intern   Victorino Sparrow, LRT/CTRS

## 2017-07-04 NOTE — [facility] Suicide Risk Assessment (Signed)
[place] INPATIENT:  Family/Significant Other Suicide Prevention Education  Suicide Prevention Education:  Patient Refusal for Family/Significant Other Suicide Prevention Education: The patient [name] [name] has refused to provide written consent for family/significant other to be provided Family/Significant Other Suicide Prevention Education during admission and/or prior to discharge.  Physician notified.  SPE completed with pt, as pt refused to consent to family contact. SPI pamphlet provided to pt and pt was encouraged to share information with support network, ask questions, and talk about any concerns relating to SPE. Pt denies access to guns/firearms and verbalized understanding of information provided. Mobile Crisis information also provided to pt.   Heather N Smart LCSW 07/04/2017, 9:06 AM

## 2017-07-04 NOTE — Progress Notes (Signed)
Patient discharged home. DC instructions provided and explained. Medications reviewed. 7 days supply given, Rx given. All questions answered. Denies SI, HI, AVH. Belongings returned. AVS, transition, and risk assessment given to patient. Pt stable at discharge.

## 2017-07-04 NOTE — Tx Team (Signed)
Interdisciplinary Treatment and Diagnostic Plan Update  07/04/2017 Time of Session: 0830AM [name] [name] MRN: [id number]  Principal Diagnosis: MDD, recurrent, severe  Secondary Diagnoses: Active Problems:   Severe major depression (HCC)   Current Medications:  Current Facility-Administered Medications  Medication Dose Route Frequency Provider Last Rate Last Dose  . albuterol (PROVENTIL HFA;VENTOLIN HFA) 108 (90 Base) MCG/ACT inhaler 2 puff  2 puff Inhalation Q4H PRN Lindon Romp A, NP      . alum & mag hydroxide-simeth (MAALOX/MYLANTA) 200-200-20 MG/5ML suspension 30 mL  30 mL Oral Q4H PRN Lindon Romp A, NP      . escitalopram (LEXAPRO) tablet 20 mg  20 mg Oral Daily Lindon Romp A, NP   20 mg at 07/04/17 0820  . fluticasone (FLONASE) 50 MCG/ACT nasal spray 2 spray  2 spray Each Nare Daily Lindon Romp A, NP   2 spray at 07/04/17 [phone]  . folic acid (FOLVITE) tablet 1 mg  1 mg Oral Daily Lindon Romp A, NP   1 mg at 07/04/17 0820  . gabapentin (NEURONTIN) capsule 300 mg  300 mg Oral TID Cobos, Myer Peer, MD   300 mg at 07/04/17 0820  . ibuprofen (ADVIL,MOTRIN) tablet 600 mg  600 mg Oral Q8H PRN Derrill Center, NP   600 mg at 07/03/17 1512  . lisinopril (PRINIVIL,ZESTRIL) tablet 5 mg  5 mg Oral Daily Derrill Center, NP   5 mg at 07/04/17 0820  . LORazepam (ATIVAN) tablet 1 mg  1 mg Oral Daily Lindon Romp A, NP      . magnesium hydroxide (MILK OF MAGNESIA) suspension 30 mL  30 mL Oral Daily PRN Lindon Romp A, NP      . multivitamin with minerals tablet 1 tablet  1 tablet Oral Daily Lindon Romp A, NP   1 tablet at 07/04/17 0820  . naltrexone (DEPADE) tablet 50 mg  50 mg Oral Daily Izediuno, Laruth Bouchard, MD   50 mg at 07/04/17 0820  . pantoprazole (PROTONIX) EC tablet 40 mg  40 mg Oral Daily Lindon Romp A, NP   40 mg at 07/04/17 0820  . thiamine (VITAMIN B-1) tablet 100 mg  100 mg Oral Daily Lindon Romp A, NP   100 mg at 07/04/17 0820  . traZODone (DESYREL) tablet 50 mg  50 mg Oral QHS PRN  Lindon Romp A, NP   50 mg at 07/03/17 2200   PTA Medications: Prescriptions Prior to Admission  Medication Sig Dispense Refill Last Dose  . albuterol (PROVENTIL HFA;VENTOLIN HFA) 108 (90 Base) MCG/ACT inhaler Inhale 2 puffs into the lungs every 4 (four) hours as needed for wheezing or shortness of breath. (Patient not taking: Reported on 04/02/2017) 1 Inhaler 0 Not Taking at Unknown time  . albuterol (PROVENTIL HFA;VENTOLIN HFA) 108 (90 Base) MCG/ACT inhaler Inhale 1-2 puffs into the lungs every 6 (six) hours as needed for wheezing or shortness of breath. (Patient not taking: Reported on 06/18/2017) 1 Inhaler 0 Not Taking at Unknown time  . busPIRone (BUSPAR) 5 MG tablet Take 1 tablet (5 mg total) by mouth 2 (two) times daily. (Patient not taking: Reported on 06/29/2017) 60 tablet 2 Not Taking at Unknown time  . chlordiazePOXIDE (LIBRIUM) 25 MG capsule Take by mouth 50 mg three times a day for 3 days, then 50 mg twice daily for 3 days, then 25 mg twice daily for 3 days, then 25 mg once daily for 3 days then STOP (Patient taking differently: Take 25-50 mg by mouth See  admin instructions. Take 2 capsules (50 mg) by mouth three times a day for 3 days, then take 2 capsules (50 mg) twice daily for 3 days, then take 1 capsule (25 mg) twice daily for 3 days, then take 1 capsule (25 mg) once daily for 3 days, then STOP) 39 capsule 0 not yet started  . escitalopram (LEXAPRO) 20 MG tablet Take 1 tablet (20 mg total) by mouth daily. 30 tablet 5 06/28/2017 at Unknown time  . fluticasone (FLONASE) 50 MCG/ACT nasal spray Place 2 sprays into both nostrils daily. (Patient not taking: Reported on 06/29/2017) 16 g 5 Not Taking at Unknown time  . fluticasone (FLONASE) 50 MCG/ACT nasal spray Place 2 sprays into both nostrils daily. (Patient not taking: Reported on 04/02/2017) 16 g 12 Not Taking at Unknown time  . folic acid (FOLVITE) 1 MG tablet Take 1 tablet (1 mg total) by mouth daily. 30 tablet 5 06/29/2017 at Unknown time  .  ipratropium (ATROVENT) 0.06 % nasal spray Place 2 sprays into both nostrils 4 (four) times daily. (Patient not taking: Reported on 03/26/2017) 15 mL 12 Not Taking at Unknown time  . pantoprazole (PROTONIX) 40 MG tablet Take 1 tablet (40 mg total) by mouth daily. (Patient not taking: Reported on 06/29/2017) 30 tablet 3 Not Taking at Unknown time  . thiamine (VITAMIN B-1) 100 MG tablet Take 1 tablet (100 mg total) by mouth daily. (Patient not taking: Reported on 06/29/2017) 30 tablet 5 Not Taking at Unknown time  . Turmeric 500 MG CAPS Take 500 mg by mouth daily. (Patient not taking: Reported on 03/26/2017)   Not Taking at Unknown time    Patient Stressors: Health problems Medication change or noncompliance Occupational concerns Substance abuse  Patient Strengths: Ability for insight Communication skills General fund of knowledge Motivation for treatment/growth Supportive family/friends  Treatment Modalities: Medication Management, Group therapy, Case management,  1 to 1 session with clinician, Psychoeducation, Recreational therapy.   Physician Treatment Plan for Primary Diagnosis: MDD, recurrent, severe  Long Term Goal(s): Improvement in symptoms so as ready for discharge Improvement in symptoms so as ready for discharge   Short Term Goals: Ability to identify changes in lifestyle to reduce recurrence of condition will improve Ability to verbalize feelings will improve Ability to disclose and discuss suicidal ideas Ability to demonstrate self-control will improve Ability to identify and develop effective coping behaviors will improve Ability to maintain clinical measurements within normal limits will improve Compliance with prescribed medications will improve Ability to identify triggers associated with substance abuse/mental health issues will improve Ability to identify changes in lifestyle to reduce recurrence of condition will improve Ability to verbalize feelings will  improve Ability to disclose and discuss suicidal ideas Ability to demonstrate self-control will improve Ability to identify and develop effective coping behaviors will improve Ability to maintain clinical measurements within normal limits will improve Compliance with prescribed medications will improve Ability to identify triggers associated with substance abuse/mental health issues will improve  Medication Management: Evaluate patient's response, side effects, and tolerance of medication regimen.  Therapeutic Interventions: 1 to 1 sessions, Unit Group sessions and Medication administration.  Evaluation of Outcomes: Adequate for discharge   Physician Treatment Plan for Secondary Diagnosis: Active Problems:   Severe major depression ([place])  Long Term Goal(s): Improvement in symptoms so as ready for discharge Improvement in symptoms so as ready for discharge   Short Term Goals: Ability to identify changes in lifestyle to reduce recurrence of condition will improve Ability to verbalize feelings  will improve Ability to disclose and discuss suicidal ideas Ability to demonstrate self-control will improve Ability to identify and develop effective coping behaviors will improve Ability to maintain clinical measurements within normal limits will improve Compliance with prescribed medications will improve Ability to identify triggers associated with substance abuse/mental health issues will improve Ability to identify changes in lifestyle to reduce recurrence of condition will improve Ability to verbalize feelings will improve Ability to disclose and discuss suicidal ideas Ability to demonstrate self-control will improve Ability to identify and develop effective coping behaviors will improve Ability to maintain clinical measurements within normal limits will improve Compliance with prescribed medications will improve Ability to identify triggers associated with substance abuse/mental health  issues will improve     Medication Management: Evaluate patient's response, side effects, and tolerance of medication regimen.  Therapeutic Interventions: 1 to 1 sessions, Unit Group sessions and Medication administration.  Evaluation of Outcomes: Met   RN Treatment Plan for Primary Diagnosis:MDD, recurrent, severe  Long Term Goal(s): Knowledge of disease and therapeutic regimen to maintain health will improve  Short Term Goals: Ability to remain free from injury will improve, Ability to participate in decision making will improve and Ability to verbalize feelings will improve  Medication Management: RN will administer medications as ordered by provider, will assess and evaluate patient's response and provide education to patient for prescribed medication. RN will report any adverse and/or side effects to prescribing provider.  Therapeutic Interventions: 1 on 1 counseling sessions, Psychoeducation, Medication administration, Evaluate responses to treatment, Monitor vital signs and CBGs as ordered, Perform/monitor CIWA, COWS, AIMS and Fall Risk screenings as ordered, Perform wound care treatments as ordered.  Evaluation of Outcomes: Met   LCSW Treatment Plan for Primary Diagnosis: MDD, recurrent, severe  Long Term Goal(s): Safe transition to appropriate next level of care at discharge, Engage patient in therapeutic group addressing interpersonal concerns.  Short Term Goals: Engage patient in aftercare planning with referrals and resources, Increase ability to appropriately verbalize feelings and Increase skills for wellness and recovery  Therapeutic Interventions: Assess for all discharge needs, 1 to 1 time with Social worker, Explore available resources and support systems, Assess for adequacy in community support network, Educate family and significant other(s) on suicide prevention, Complete Psychosocial Assessment, Interpersonal group therapy.  Evaluation of Outcomes:  Met   Progress in Treatment: Attending groups: Yes. Participating in groups: Yes. Taking medication as prescribed: Yes. Toleration medication: Yes. Family/Significant other contact made: SPE completed with pt; pt declined to consent to family contact.  Patient understands diagnosis: Yes. Discussing patient identified problems/goals with staff: Yes. Medical problems stabilized or resolved: Yes. Denies suicidal/homicidal ideation: Yes. Issues/concerns per patient self-inventory: No. Other: n/a   New problem(s) identified: No, Describe:  n/a  New Short Term/Long Term Goal(s): detox, medication management for mood stabilization; development of comprehensive mental wellness/sobriety plan.   Discharge Plan or Barriers:  Pt plans to follow-up at [facility] for mental health services (medication management)-appt made for 8/15 and [place] for peer support counseling. AA at night "after work."   Reason for Continuation of Hospitalization:  none  Estimated Length of Stay: Wed 07/04/17-part bus and [employer] bus passes provided per his request. Work note also provided.   Attendees: Patient: 07/04/2017 9:08 AM  Physician: Dr. Parke Poisson; Dr. Shea Evans MD 07/04/2017 9:08 AM  Nursing: Ian Malkin RN 07/04/2017 9:08 AM  RN Care Manager: Lars Pinks CM 07/04/2017 9:08 AM  Social Worker: Maxie Better, LCSW 07/04/2017 9:08 AM  Recreational Therapist: x 07/04/2017 9:08 AM  Other: Lindell Spar NP; Mordecai Maes NP; Darnelle Maffucci Money NP 07/04/2017 9:08 AM  Other:  07/04/2017 9:08 AM  Other: 07/04/2017 9:08 AM    Scribe for Treatment Team: [place], LCSW 07/04/2017 9:08 AM

## 2017-07-05 NOTE — Progress Notes (Signed)
Several messages left for pt's employer requesting fax number. Pt was provided with paper hospital note signed by MD. Waiting for call back from his employer.  Maxie Better, MSW, LCSW Clinical Social Worker 07/05/2017 8:46 AM

## 2017-07-19 ENCOUNTER — Telehealth: Payer: Self-pay | Admitting: Licensed Clinical Social Worker

## 2017-07-19 NOTE — Telephone Encounter (Signed)
LCSWA received an incoming call from pt who reported daily substance use ("couple of forty's") Pt is interested in detox and substance use treatment; however, disclosed transportation is a barrier to treatment. Denies SI/HI.  Call placed to [facility] to obtain detox resources.  [place] contacted [facility] and spoke with Admissions Coordinator who recommended pt go to ED to assist with withdrawal symptoms.   LCSWA provided pt with resources for crisis intervention, detox, and residential treatment programs.   Pt was appreciative for the assistance.

## 2017-07-22 ENCOUNTER — Emergency Department (HOSPITAL_COMMUNITY): Payer: Self-pay

## 2017-07-22 ENCOUNTER — Emergency Department (HOSPITAL_COMMUNITY)
Admission: EM | Admit: 2017-07-22 | Discharge: 2017-07-24 | Disposition: A | Discharge status: Home / Self Care | Payer: Self-pay | Attending: Emergency Medicine | Admitting: Emergency Medicine

## 2017-07-22 ENCOUNTER — Encounter (HOSPITAL_COMMUNITY): Payer: Self-pay

## 2017-07-22 DIAGNOSIS — R0789 Other chest pain: Secondary | ICD-10-CM

## 2017-07-22 DIAGNOSIS — E785 Hyperlipidemia, unspecified: Secondary | ICD-10-CM | POA: Insufficient documentation

## 2017-07-22 DIAGNOSIS — R4182 Altered mental status, unspecified: Secondary | ICD-10-CM | POA: Insufficient documentation

## 2017-07-22 DIAGNOSIS — F10920 Alcohol use, unspecified with intoxication, uncomplicated: Secondary | ICD-10-CM

## 2017-07-22 DIAGNOSIS — R45851 Suicidal ideations: Secondary | ICD-10-CM

## 2017-07-22 DIAGNOSIS — Z87891 Personal history of nicotine dependence: Secondary | ICD-10-CM | POA: Insufficient documentation

## 2017-07-22 DIAGNOSIS — Z79899 Other long term (current) drug therapy: Secondary | ICD-10-CM | POA: Insufficient documentation

## 2017-07-22 LAB — RAPID URINE DRUG SCREEN, HOSP PERFORMED
Amphetamines: NOT DETECTED
Barbiturates: NOT DETECTED
Benzodiazepines: POSITIVE — AB
Cocaine: NOT DETECTED
Opiates: NOT DETECTED
Tetrahydrocannabinol: NOT DETECTED

## 2017-07-22 LAB — BASIC METABOLIC PANEL
Anion gap: 12 (ref 5–15)
BUN: 11 mg/dL (ref 6–20)
CO2: 23 mmol/L (ref 22–32)
Calcium: 8.8 mg/dL — ABNORMAL LOW (ref 8.9–10.3)
Chloride: 102 mmol/L (ref 101–111)
Creatinine, Ser: 1.1 mg/dL (ref 0.61–1.24)
GFR calc Af Amer: >60 mL/min (ref >60)
GFR calc non Af Amer: >60 mL/min (ref >60)
Glucose, Bld: 118 mg/dL — ABNORMAL HIGH (ref 65–99)
Potassium: 4.2 mmol/L (ref 3.5–5.1)
Sodium: 137 mmol/L (ref 135–145)

## 2017-07-22 LAB — CBC
HCT: 39.4 % (ref 39.0–52.0)
Hemoglobin: 12.8 g/dL — ABNORMAL LOW (ref 13.0–17.0)
MCH: 26.3 pg (ref 26.0–34.0)
MCHC: 32.5 g/dL (ref 30.0–36.0)
MCV: 80.9 fL (ref 78.0–100.0)
Platelets: 179 10*3/uL (ref 150–400)
RBC: 4.87 MIL/uL (ref 4.22–5.81)
RDW: 15.6 % — ABNORMAL HIGH (ref 11.5–15.5)
WBC: 6.2 10*3/uL (ref 4.0–10.5)

## 2017-07-22 LAB — ETHANOL: Alcohol, Ethyl (B): 359 mg/dL (ref <5)

## 2017-07-22 LAB — I-STAT TROPONIN, ED: Troponin i, poc: 0 ng/mL (ref 0.00–0.08)

## 2017-07-22 MED ORDER — VITAMIN B-1 100 MG PO TABS
100.0000 mg | ORAL_TABLET | Freq: Every day | ORAL | Status: DC
  Administered 2017-07-23 – 2017-07-24 (×2): 100 mg via ORAL
  Filled 2017-07-22 (×3): qty 1

## 2017-07-22 MED ORDER — LORAZEPAM 1 MG PO TABS
0.0000 mg | ORAL_TABLET | Freq: Four times a day (QID) | ORAL | Status: DC
  Administered 2017-07-23 – 2017-07-24 (×3): 1 mg via ORAL
  Filled 2017-07-22 (×2): qty 1
  Filled 2017-07-22: qty 2

## 2017-07-22 MED ORDER — NICOTINE 21 MG/24HR TD PT24
21.0000 mg | MEDICATED_PATCH | Freq: Every day | TRANSDERMAL | Status: DC
  Filled 2017-07-22: qty 1

## 2017-07-22 MED ORDER — THIAMINE HCL 100 MG/ML IJ SOLN: 100.0000 mg | Freq: Every day | INTRAMUSCULAR | Status: DC

## 2017-07-22 MED ORDER — LORAZEPAM 2 MG/ML IJ SOLN: 0.0000 mg | Freq: Four times a day (QID) | INTRAMUSCULAR | Status: DC

## 2017-07-22 MED ORDER — LORAZEPAM 1 MG PO TABS: 0.0000 mg | ORAL_TABLET | Freq: Two times a day (BID) | ORAL | Status: DC

## 2017-07-22 MED ORDER — LORAZEPAM 2 MG/ML IJ SOLN: 0.0000 mg | Freq: Two times a day (BID) | INTRAMUSCULAR | Status: DC

## 2017-07-22 MED ORDER — ALUM & MAG HYDROXIDE-SIMETH 200-200-20 MG/5ML PO SUSP: 30.0000 mL | Freq: Four times a day (QID) | ORAL | Status: DC | PRN

## 2017-07-22 MED ORDER — IBUPROFEN 400 MG PO TABS: 600.0000 mg | ORAL_TABLET | Freq: Three times a day (TID) | ORAL | Status: DC | PRN

## 2017-07-22 MED ORDER — ONDANSETRON HCL 4 MG PO TABS: 4.0000 mg | ORAL_TABLET | Freq: Three times a day (TID) | ORAL | Status: DC | PRN

## 2017-07-22 NOTE — BH Assessment (Addendum)
Tele Assessment Note   Patient Name: [name] [name] MRN: [id number] Referring Physician: Carlisle Cater, PA-C Location of Patient: [facility] Location of Provider: [place] Department  [name] [name] is an 56 y.o. male who presents to the ED voluntarily. Pt reports increased alcohol abuse and passive suicidal ideations. Pt is slow to respond during the assessment and often pauses for up to 20-30 seconds before responding to the Probation officer. Pt was asked to verify his DOB and pt paused for about 8 seconds and said "[birth year]." Pt states he has been drinking a 12 pack of beer daily and he is currently homeless. Pt was asked to provide details about his SI and current thoughts of harm and after several seconds pt stated "I try not to think about that but sometimes I think it would be better if I wasn't here." Pt stated he believes in his "[employer] and knows he will get me through this." Pt reports he has been dealing with "life problems" and uses alcohol to ease his stress. Pt denies a current provider but states he has been to [facility] in the past. Pt was recently d/c from to [facility] on 07/04/17 after spending 5 days at [facility] and pt stated "I don't think they kept me long enough." Pt reports he has been trying to get into a detox program and he continued asking this writer if I would be able to accept him at [facility]. Pt was asked if he was able to contract for safety and pt paused for several seconds and stated "no because I don't have nowhere to go."   Pt is recommended for AM psych eval by Lindon Romp, NP. EDP and RN notified of recommendation.   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 Alcohol abuse    Last Drink on Sep 2015  . Anxiety    About 56 years of age  . Carpal tunnel syndrome of right wrist Dx 2015  . Depression    At 56 years of age  . GERD (gastroesophageal reflux disease) Dx 2007  . 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 Hyperlipidemia    borderline, diet controlled  . Pancreatitis Dx 2014  . Pneumonia 11/2014  . 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 Reflux Dx 2007  . 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 carpel tunnel release Right 07/28/2014    done in [place]   . ELBOW SURGERY    . KNEE ARTHROSCOPY    . KNEE SURGERY Left    15 years ago  . LIPOMA EXCISION N/A 05/10/2016   Procedure: EXCISION OF SCALP LIPOMA;  Surgeon: Johnathan Hausen, MD;  Location: [place];  Service: General;  Laterality: N/A;    Family History:  Family History  Problem Relation Age of Onset  . Diabetes Father   . Hypertension Father   . Hypertension Paternal Grandfather   . Alcohol abuse Paternal Grandfather   . Alcohol abuse Maternal Grandfather   . Alcohol abuse Maternal Grandmother   . Alcohol abuse Paternal Grandmother   . Colon cancer Neg Hx   . Rectal cancer Neg Hx   . Stomach cancer Neg Hx     Social History:  reports that he has quit smoking. His smoking use included Cigars. He has never used smokeless tobacco. He reports that he drinks alcohol.  He reports that he does not use drugs.  Additional Social History:  Alcohol / Drug Use Pain Medications: See MAR Prescriptions: See MAR Over the Counter: See MAR History of alcohol / drug use?: Yes Substance #1 Name of Substance 1: Alcohol 1 - Age of First Use: 19 1 - Amount (size/oz): 12 pack per day  1 - Frequency: daily 1 - Duration: ongoing 1 - Last Use / Amount: PTA  CIWA: CIWA-Ar BP: 101/87 Pulse Rate: 96 Nausea and Vomiting: no nausea and no vomiting Tactile Disturbances: very mild itching, pins and needles, burning or numbness Tremor: no tremor Auditory Disturbances: not present Paroxysmal Sweats: no sweat visible Visual Disturbances: not present Anxiety: mildly anxious Headache, Fullness in Head: very mild Agitation: somewhat more  than normal activity Orientation and Clouding of Sensorium: oriented and can do serial additions CIWA-Ar Total: 4 COWS:    PATIENT STRENGTHS: (choose at least two) Average or above average intelligence Communication skills Motivation for treatment/growth Religious Affiliation  Allergies:  Allergies  Allergen Reactions  . Oxycodone Other (See Comments)    abd pain, stomach cramps     Home Medications:  (Not in a hospital admission)  OB/GYN Status:  No LMP for male patient.  General Assessment Data Location of Assessment: [facility] ED TTS Assessment: In system Is this a Tele or Face-to-Face Assessment?: Tele Assessment Is this an Initial Assessment or a Re-assessment for this encounter?: Initial Assessment Marital status: Separated Is patient pregnant?: No Pregnancy Status: No Living Arrangements: Other (Comment) (homeless) Can pt return to current living arrangement?: Yes Admission Status: Voluntary Is patient capable of signing voluntary admission?: Yes Referral Source: Self/Family/Friend Insurance type: none     Crisis Care Plan Living Arrangements: Other (Comment) (homeless) Name of Psychiatrist: none Name of Therapist: none  Education Status Is patient currently in school?: No Highest grade of school patient has completed: some college   Risk to self with the past 6 months Suicidal Ideation: No-Not Currently/Within Last 6 Months Has patient been a risk to self within the past 6 months prior to admission? : No Suicidal Intent: No Has patient had any suicidal intent within the past 6 months prior to admission? : No Is patient at risk for suicide?: Yes (per risk factors detailed in assessment ) Suicidal Plan?: No Has patient had any suicidal plan within the past 6 months prior to admission? : No Access to Means: No What has been your use of drugs/alcohol within the last 12 months?: reports to daily alcohol use  Previous Attempts/Gestures: No Triggers for Past  Attempts: None known Intentional Self Injurious Behavior: None Family Suicide History: Yes (cousin reportedly committed suicide ) Recent stressful life event(s): Divorce, Museum/gallery curator Problems, Other (Comment) (homeless, increased substance abuse ) Persecutory voices/beliefs?: No Depression: Yes Depression Symptoms: Despondent, Guilt, Feeling worthless/self pity, Loss of interest in usual pleasures, Fatigue Substance abuse history and/or treatment for substance abuse?: Yes Suicide prevention information given to non-admitted patients: Not applicable  Risk to Others within the past 6 months Homicidal Ideation: No Does patient have any lifetime risk of violence toward others beyond the six months prior to admission? : No Thoughts of Harm to Others: No Current Homicidal Intent: No Current Homicidal Plan: No Access to Homicidal Means: No History of harm to others?: No Assessment of Violence: None Noted Does patient have access to weapons?: No Criminal Charges Pending?: No Does patient have a court date: No Is patient on probation?: No  Psychosis Hallucinations: None noted Delusions: None noted  Mental  Status Report Appearance/Hygiene: In scrubs, Unremarkable Eye Contact: Good Motor Activity: Freedom of movement Speech: Soft, Slow Level of Consciousness: Quiet/awake Mood: Depressed, Anxious, Helpless, Worthless, low self-esteem, Despair Affect: Anxious, Depressed, Sullen Anxiety Level: Moderate Thought Processes: Thought Blocking Judgement: Impaired Orientation: Person, Place, Time, Situation, Appropriate for developmental age Obsessive Compulsive Thoughts/Behaviors: None  Cognitive Functioning Concentration: Fair Memory: Recent Intact, Remote Intact IQ: Average Insight: Fair Impulse Control: Fair Appetite: Good Sleep: Decreased Total Hours of Sleep: 6 Vegetative Symptoms: None  ADLScreening [facility] Assessment Services) Patient's cognitive ability adequate to safely  complete daily activities?: Yes Patient able to express need for assistance with ADLs?: Yes Independently performs ADLs?: Yes (appropriate for developmental age)  Prior Inpatient Therapy Prior Inpatient Therapy: Yes Prior Therapy Dates: 2018, 2017 and multiple other admits Prior Therapy Facilty/Provider(s): [facility] Reason for Treatment: Alcohol detox, MDD  Prior Outpatient Therapy Prior Outpatient Therapy: Yes Prior Therapy Dates: 2018 Prior Therapy Facilty/Provider(s): [facility] Reason for Treatment: Alcohol use Does patient have an ACCT team?: No Does patient have Intensive In-House Services?  : No Does patient have [facility] services? : Yes (pt states he has not been consistent with his appts ) Does patient have P4CC services?: No  ADL Screening (condition at time of admission) Patient's cognitive ability adequate to safely complete daily activities?: Yes Is the patient deaf or have difficulty hearing?: No Does the patient have difficulty seeing, even when wearing glasses/contacts?: No Does the patient have difficulty concentrating, remembering, or making decisions?: No Patient able to express need for assistance with ADLs?: Yes Does the patient have difficulty dressing or bathing?: No Independently performs ADLs?: Yes (appropriate for developmental age) Does the patient have difficulty walking or climbing stairs?: No Weakness of Legs: None Weakness of Arms/Hands: None  Home Assistive Devices/Equipment Home Assistive Devices/Equipment: Eyeglasses    Abuse/Neglect Assessment (Assessment to be complete while patient is alone) Physical Abuse: Denies Verbal Abuse: Denies Sexual Abuse: Denies Exploitation of patient/patient's resources: Denies Self-Neglect: Denies     Regulatory affairs officer (For Healthcare) Does Patient Have a Medical Advance Directive?: No Would patient like information on creating a medical advance directive?: No - Patient declined    Additional  Information 1:1 In Past 12 Months?: No CIRT Risk: No Elopement Risk: No Does patient have medical clearance?:  (pending)     Disposition:  Disposition Initial Assessment Completed for this Encounter: Yes Disposition of Patient: Other dispositions Other disposition(s): Other (Comment) (AM psych eval per Lindon Romp, NP)  This service was provided via telemedicine using a 2-way, interactive audio and video technology.  Names of all persons participating in this telemedicine service and their role in this encounter. Name: Lind Covert Role: TTS Counselor  Name: [name] [name]    Role: Patient   Lyanne Co 07/22/2017 10:22 PM

## 2017-07-22 NOTE — ED Provider Notes (Signed)
[facility] DEPT Provider Note   CSN: [id number] Arrival date & time: 07/22/17  1907     History   Chief Complaint Chief Complaint  Patient presents with  . Chest Pain  . Alcohol Problem    HPI [name] [name] is a 56 y.o. male.  Patient with history of heavy alcohol use, sarcoidosis, recent admission to behavioral health for suicidal ideation and detox from alcohol -- patient reports relapsing several days after discharge on 06/29/17. Patient has been drinking a 12 pack of beer a day. He reports having some chest and epigastric pains over the past several months. Pains are described as dull and aching. These come and go. They occur at rest. Reports worse with food. History of gastritis. No diaphoresis, vomiting. No exertional symptoms. No radiation of pain. No h/o HTN, smoking, DM. Chart states high cholesterol (pt denies).   Patient also reports being homeless. He states "I am tired of this", referring to his drinking. Patient states ongoing thoughts of harming himself however he does not have an explicit plan.   Denies other medical complaints.       Past Medical History:  Diagnosis Date  . Alcohol abuse    Last Drink on Sep 2015  . Anxiety    About 56 years of age  . Carpal tunnel syndrome of right wrist Dx 2015  . Depression    At 56 years of age  . GERD (gastroesophageal reflux disease) Dx 2007  . Hepatitis, alcoholic, acute 2703  . Hyperlipidemia    borderline, diet controlled  . Pancreatitis Dx 2014  . Pneumonia 11/2014  . 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 Reflux Dx 2007  . 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 Severe major depression ([place]) 06/30/2017  . Lateral epicondylitis of right elbow 01/05/2017  . Strain, MCP, hand, right, initial encounter 01/05/2017  . 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 Bilateral knee pain 01/11/2016  . Chronic pain of right wrist 07/07/2015  . Carpal tunnel  syndrome of right wrist 05/20/2015  . Dislocation of right shoulder joint 05/20/2015  . Tear of medial meniscus of right knee 03/24/2015  . 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 Chronic cough 01/15/2015  . Onychomycosis of toenail 12/15/2014  . Seborrhea 11/11/2014  . GERD (gastroesophageal reflux disease)   . Alcohol abuse 01/19/2014  . Generalized anxiety disorder 09/02/2013    Past Surgical History:  Procedure Laterality Date  . carpel tunnel release Right 07/28/2014    done in [place]   . ELBOW SURGERY    . KNEE ARTHROSCOPY    . KNEE SURGERY Left    15 years ago  . LIPOMA EXCISION N/A 05/10/2016   Procedure: EXCISION OF SCALP LIPOMA;  Surgeon: Johnathan Hausen, MD;  Location: [facility];  Service: General;  Laterality: N/A;       Home Medications    Prior to Admission medications   Medication Sig Start Date End Date Taking? Authorizing Provider  albuterol (PROVENTIL HFA;VENTOLIN HFA) 108 (90 Base) MCG/ACT inhaler Inhale 2 puffs into the lungs every 4 (four) hours as needed for wheezing or shortness of breath. 03/01/17  Yes Mabe, Shanon Brow, NP  escitalopram (LEXAPRO) 20 MG tablet Take 1 tablet (20 mg total) by mouth daily. 07/05/17  Yes Mordecai Maes, NP  folic acid (FOLVITE) 1 MG tablet Take 1 tablet (1 mg total) by mouth daily. 06/18/17  Yes Boykin Nearing, MD  gabapentin (NEURONTIN)  300 MG capsule Take 1 capsule (300 mg total) by mouth 3 (three) times daily. 07/04/17  Yes Mordecai Maes, NP  lisinopril (PRINIVIL,ZESTRIL) 5 MG tablet Take 1 tablet (5 mg total) by mouth daily. 07/05/17  Yes Mordecai Maes, NP  naltrexone (DEPADE) 50 MG tablet Take 1 tablet (50 mg total) by mouth daily. 07/05/17  Yes Mordecai Maes, NP  pantoprazole (PROTONIX) 40 MG tablet Take 1 tablet (40 mg total) by mouth daily. 07/05/17  Yes Mordecai Maes, NP  thiamine 100 MG tablet Take 1 tablet (100 mg total) by mouth daily. 07/05/17  Yes Mordecai Maes, NP  traZODone (DESYREL) 50 MG  tablet Take 1 tablet (50 mg total) by mouth at bedtime as needed for sleep. 07/04/17  Yes Mordecai Maes, NP    Family History Family History  Problem Relation Age of Onset  . Diabetes Father   . Hypertension Father   . Hypertension Paternal Grandfather   . Alcohol abuse Paternal Grandfather   . Alcohol abuse Maternal Grandfather   . Alcohol abuse Maternal Grandmother   . Alcohol abuse Paternal Grandmother   . Colon cancer Neg Hx   . Rectal cancer Neg Hx   . Stomach cancer Neg Hx     Social History Social History  Substance Use Topics  . Smoking status: Former Smoker    Types: Cigars  . Smokeless tobacco: Never Used     Comment: smokes occasional black &mild  . Alcohol use 0.0 oz/week     Allergies   Oxycodone   Review of Systems Review of Systems  Constitutional: Negative for diaphoresis and fever.  Eyes: Negative for redness.  Respiratory: Negative for cough and shortness of breath.   Cardiovascular: Positive for chest pain. Negative for palpitations and leg swelling.  Gastrointestinal: Negative for abdominal pain, nausea and vomiting.  Genitourinary: Negative for dysuria.  Musculoskeletal: Negative for back pain and neck pain.  Skin: Negative for rash.  Neurological: Negative for syncope and light-headedness.  Psychiatric/Behavioral: Positive for dysphoric mood and suicidal ideas. The patient is not nervous/anxious.      Physical Exam Updated Vital Signs BP 114/80   Pulse 77   Temp 98.3 F (36.8 C) (Oral)   Resp 19   SpO2 92%   Physical Exam  Constitutional: He appears well-developed and well-nourished.  HENT:  Head: Normocephalic and atraumatic.  Mouth/Throat: Oropharynx is clear and moist and mucous membranes are normal. Mucous membranes are not dry.  Eyes: Conjunctivae are normal.  Neck: Trachea normal and normal range of motion. Neck supple. Normal carotid pulses and no JVD present. No muscular tenderness present. Carotid bruit is not present. No  tracheal deviation present.  Cardiovascular: Normal rate, regular rhythm, S1 normal, S2 normal, normal heart sounds and intact distal pulses.  Exam reveals no distant heart sounds and no decreased pulses.   No murmur heard. Pulmonary/Chest: Effort normal and breath sounds normal. No respiratory distress. He has no wheezes. He exhibits no tenderness.  Abdominal: Soft. Normal aorta and bowel sounds are normal. There is no tenderness. There is no rebound and no guarding.  Musculoskeletal: He exhibits no edema.  Neurological: He is alert.  Skin: Skin is warm and dry. He is not diaphoretic. No cyanosis. No pallor.  Psychiatric: He is not actively hallucinating. He exhibits a depressed mood. He expresses suicidal ideation. He expresses no homicidal ideation. He expresses no suicidal plans and no homicidal plans.  Nursing note and vitals reviewed.    ED Treatments / Results  Labs (all labs  ordered are listed, but only abnormal results are displayed) Labs Reviewed  BASIC METABOLIC PANEL - Abnormal; Notable for the following:       Result Value   Glucose, Bld 118 (*)    Calcium 8.8 (*)    All other components within normal limits  CBC - Abnormal; Notable for the following:    Hemoglobin 12.8 (*)    RDW 15.6 (*)    All other components within normal limits  ETHANOL - Abnormal; Notable for the following:    Alcohol, Ethyl (B) 359 (*)    All other components within normal limits  RAPID URINE DRUG SCREEN, HOSP PERFORMED - Abnormal; Notable for the following:    Benzodiazepines POSITIVE (*)    All other components within normal limits  I-STAT TROPONIN, ED    EKG  EKG Interpretation  Date/Time:  Sunday July 22 2017 19:10:59 EDT Ventricular Rate:  111 PR Interval:  138 QRS Duration: 86 QT Interval:  310 QTC Calculation: 421 R Axis:   64 Text Interpretation:  Sinus tachycardia Minimal voltage criteria for LVH, may be normal variant Septal infarct , age undetermined Abnormal ECG  Confirmed by Lita Mains  MD, DAVID ([id number]) on 07/22/2017 7:24:54 PM       Radiology Dg Chest 2 View  Result Date: 07/22/2017 CLINICAL DATA:  Acute on chronic chest pain. Alcohol intake today. History of sarcoidosis. EXAM: CHEST  2 VIEW COMPARISON:  Chest radiograph March 18, 2017 FINDINGS: Cardiomediastinal silhouette is unremarkable. Biapical scarring with hilar retraction. No pleural effusion or focal consolidation. No pneumothorax. Soft tissue planes and included osseous structures are nonsuspicious. IMPRESSION: Stable biapical scarring attributable to patient's sarcoidosis. Electronically Signed   By: Elon Alas M.D.   On: 07/22/2017 20:25    Procedures Procedures (including critical care time)  Medications Ordered in ED Medications  LORazepam (ATIVAN) injection 0-4 mg (not administered)    Or  LORazepam (ATIVAN) tablet 0-4 mg (not administered)  LORazepam (ATIVAN) injection 0-4 mg (not administered)    Or  LORazepam (ATIVAN) tablet 0-4 mg (not administered)  thiamine (VITAMIN B-1) tablet 100 mg (not administered)    Or  thiamine (B-1) injection 100 mg (not administered)  ondansetron (ZOFRAN) tablet 4 mg (not administered)  nicotine (NICODERM CQ - dosed in mg/24 hours) patch 21 mg (not administered)  alum & mag hydroxide-simeth (MAALOX/MYLANTA) 200-200-20 MG/5ML suspension 30 mL (not administered)  ibuprofen (ADVIL,MOTRIN) tablet 600 mg (not administered)     Initial Impression / Assessment and Plan / ED Course  I have reviewed the triage vital signs and the nursing notes.  Pertinent labs & imaging results that were available during my care of the patient were reviewed by me and considered in my medical decision making (see chart for details).     Patient seen and examined. Work-up initiated. Medications ordered.   Vital signs reviewed and are as follows: BP 114/80   Pulse 77   Temp 98.3 F (36.8 C) (Oral)   Resp 19   SpO2 92%   TTS consulted. Plan for AM  psych eval.   CP is atypical, waxing and waning.   Final Clinical Impressions(s) / ED Diagnoses   Final diagnoses:  Alcoholic intoxication without complication ([place])  Suicidal ideation  Other chest pain   CP: atypical, ongoing, troponin negative, EKG without ischemic findings. No risk factors or signs of PE. Low concern for ACS. More likely gastritis/esophagitis from ongoing EtOH abuse. Pt is medically cleared.   SI/EtOH: TTS consulted. Recent admit to [facility].  Re-eval in AM.   New Prescriptions New Prescriptions   No medications on file     Carlisle Cater, Hershal Coria 07/22/17 [id number]    Sherwood Gambler, MD 07/25/17 [phone]

## 2017-07-22 NOTE — ED Notes (Signed)
TTS in progress 

## 2017-07-22 NOTE — ED Notes (Signed)
Pt to be reassessed in am by psych

## 2017-07-22 NOTE — ED Triage Notes (Signed)
Pt reports he has chest pain but today is worse, diaphoretic.  Pt has had 12 pack of beer to drink today.  Pt states "I think my problem is my alcoholism".  Pt states "I need to go back to detox:".

## 2017-07-22 NOTE — Progress Notes (Signed)
TTS attempting to reach RN to set up TTS cart for assessment, no answer. Called ext [phone] and was routed to nurse secretary Magda Paganini who placed me on hold, line then began to ring several times with no answer. TTS will attempt to call again.  Lind Covert, MSW, [place] TTS Specialist [phone]

## 2017-07-23 DIAGNOSIS — F332 Major depressive disorder, recurrent severe without psychotic features: Secondary | ICD-10-CM

## 2017-07-23 DIAGNOSIS — F1994 Other psychoactive substance use, unspecified with psychoactive substance-induced mood disorder: Secondary | ICD-10-CM

## 2017-07-23 DIAGNOSIS — Z87891 Personal history of nicotine dependence: Secondary | ICD-10-CM

## 2017-07-23 DIAGNOSIS — F419 Anxiety disorder, unspecified: Secondary | ICD-10-CM

## 2017-07-23 DIAGNOSIS — R450 Nervousness: Secondary | ICD-10-CM

## 2017-07-23 DIAGNOSIS — Z811 Family history of alcohol abuse and dependence: Secondary | ICD-10-CM

## 2017-07-23 DIAGNOSIS — F1010 Alcohol abuse, uncomplicated: Secondary | ICD-10-CM

## 2017-07-23 MED ORDER — PANTOPRAZOLE SODIUM 40 MG PO TBEC
40.0000 mg | DELAYED_RELEASE_TABLET | Freq: Every day | ORAL | Status: DC
  Administered 2017-07-23 – 2017-07-24 (×2): 40 mg via ORAL
  Filled 2017-07-23 (×3): qty 1

## 2017-07-23 MED ORDER — LISINOPRIL 5 MG PO TABS
5.0000 mg | ORAL_TABLET | Freq: Every day | ORAL | Status: DC
  Administered 2017-07-23 – 2017-07-24 (×2): 5 mg via ORAL
  Filled 2017-07-23 (×2): qty 2
  Filled 2017-07-23: qty 1

## 2017-07-23 MED ORDER — LISINOPRIL 5 MG PO TABS: 5.0000 mg | ORAL_TABLET | Freq: Every day | ORAL | 0 refills | Status: DC

## 2017-07-23 MED ORDER — ALBUTEROL SULFATE HFA 108 (90 BASE) MCG/ACT IN AERS
1.0000 | INHALATION_SPRAY | Freq: Four times a day (QID) | RESPIRATORY_TRACT | Status: DC | PRN
  Filled 2017-07-23: qty 6.7

## 2017-07-23 MED ORDER — TRAZODONE HCL 50 MG PO TABS
50.0000 mg | ORAL_TABLET | Freq: Every evening | ORAL | Status: DC | PRN
  Administered 2017-07-23: 50 mg via ORAL
  Filled 2017-07-23 (×2): qty 1

## 2017-07-23 MED ORDER — ESCITALOPRAM OXALATE 10 MG PO TABS
20.0000 mg | ORAL_TABLET | Freq: Every day | ORAL | Status: DC
  Administered 2017-07-23 – 2017-07-24 (×2): 20 mg via ORAL
  Filled 2017-07-23 (×3): qty 2

## 2017-07-23 MED ORDER — GABAPENTIN 300 MG PO CAPS
300.0000 mg | ORAL_CAPSULE | Freq: Three times a day (TID) | ORAL | Status: DC
  Administered 2017-07-23 – 2017-07-24 (×4): 300 mg via ORAL
  Filled 2017-07-23 (×5): qty 1

## 2017-07-23 MED ORDER — ESCITALOPRAM OXALATE 20 MG PO TABS: 20.0000 mg | ORAL_TABLET | Freq: Every day | ORAL | 0 refills | Status: DC

## 2017-07-23 MED ORDER — LORAZEPAM 1 MG PO TABS
2.0000 mg | ORAL_TABLET | Freq: Once | ORAL | Status: AC
  Administered 2017-07-23: 2 mg via ORAL
  Filled 2017-07-23: qty 2

## 2017-07-23 MED ORDER — ALBUTEROL SULFATE (2.5 MG/3ML) 0.083% IN NEBU: 3.0000 mL | INHALATION_SOLUTION | RESPIRATORY_TRACT | Status: DC | PRN

## 2017-07-23 NOTE — Consult Note (Signed)
Telepsych Consultation   Reason for Consult: Alcohol abuse Referring Physician: EDP Location of Patient: [facility] ED Location of Provider: [facility]  Patient Identification: [name] [name] MRN:  [id number] Principal Diagnosis: <principal problem not specified> Diagnosis:   Patient Active Problem List   Diagnosis Date Noted  . Severe major depression ([place]) [F32.2] 06/30/2017  . Lateral epicondylitis of right elbow [M77.11] 01/05/2017  . Strain, MCP, hand, right, initial encounter [K99.833A] 01/05/2017  . 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 Bilateral knee pain [M25.561, M25.562] 01/11/2016  . Chronic pain of right wrist [M25.531, G89.29] 07/07/2015  . 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 Dislocation of right shoulder joint [S43.004A] 05/20/2015  . Tear of medial meniscus of right knee [S83.241A] 03/24/2015  . 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 Chronic cough [R05] 01/15/2015  . Onychomycosis of toenail [B35.1] 12/15/2014  . Seborrhea [L21.9] 11/11/2014  . GERD (gastroesophageal reflux disease) [K21.9]   . Alcohol abuse [F10.10] 01/19/2014  . Generalized anxiety disorder [F41.1] 09/02/2013    Total Time spent with patient: 30 minutes  Subjective:   [name] [name] is a 56 y.o. male patient admitted with Substance Induced Mood D/O; Alcohol Use DO, severe.  HPI: Per the intake assessment completed on 07/22/17 by Lind Covert: [name] [name] is an 56 y.o. male who presents to the ED voluntarily. Pt reports increased alcohol abuse and passive suicidal ideations. Pt is slow to respond during the assessment and often pauses for up to 20-30 seconds before responding to the Probation officer. Pt was asked to verify his DOB and pt paused for about 8 seconds and said "[birth year]." Pt states he has been drinking a 12 pack of beer daily and he is currently homeless. Pt was asked to provide details about his SI and current thoughts of harm and after several  seconds pt stated "I try not to think about that but sometimes I think it would be better if I wasn't here." Pt stated he believes in his "[employer] and knows he will get me through this." Pt reports he has been dealing with "life problems" and uses alcohol to ease his stress. Pt denies a current provider but states he has been to [facility] in the past. Pt was recently d/c from to [facility] on 07/04/17 after spending 5 days at [facility] and pt stated "I don't think they kept me long enough." Pt reports he has been trying to get into a detox program and he continued asking this writer if I would be able to accept him at [facility]. Pt was asked if he was able to contract for safety and pt paused for several seconds and stated "no because I don't have nowhere to go."   On my assessment today, 07/23/17: Patient was seen via tele-psych, chart reviewed with treatment team. Patient in bed, awake, alert and oriented x4. Patient reiterated the reason for this hospital admission as documented above. Patient stated, "I am not doing well. I need treatment. I relapsed on alcohol and my anxiety level is high". Patient stated that he should have not discharged the time he did during his last admission. 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 He stated that he is now determined to do whatever it takes to get cleaned from alcohol abuse. He stated that it's not helping him, he is now homeless and without his family. He stated that his  support system includes his two sons, 73 and 56 y.o who live in [place] with his estranged wife (seprated for over 10 yrs). Patient stated that he needs help to get into a substance abuse treatment facility. He said he will never use alcohol anymore because its not helping him. He denies current SI/HI/VAHH, he stated that he believes in high power. Patient stated that he has been through SA tx in the past and is now  determined to make things right. Patient appears very sad and depressed during this encounter, he does not appear to be attending to any stimuli. He has a tearful episode and pleaded not to be discharged without adequate help because he lacks the will power to make it on his own without treatment.   Past Psychiatric History: See H&P  Risk to Self: Suicidal Ideation: No-Not Currently/Within Last 6 Months Suicidal Intent: No Is patient at risk for suicide?: Yes (per risk factors detailed in assessment ) Suicidal Plan?: No Access to Means: No What has been your use of drugs/alcohol within the last 12 months?: reports to daily alcohol use  Triggers for Past Attempts: None known Intentional Self Injurious Behavior: None Risk to Others: Homicidal Ideation: No Thoughts of Harm to Others: No Current Homicidal Intent: No Current Homicidal Plan: No Access to Homicidal Means: No History of harm to others?: No Assessment of Violence: None Noted Does patient have access to weapons?: No Criminal Charges Pending?: No Does patient have a court date: No Prior Inpatient Therapy: Prior Inpatient Therapy: Yes Prior Therapy Dates: 2018, 2017 and multiple other admits Prior Therapy Facilty/Provider(s): [facility] Reason for Treatment: Alcohol detox, MDD Prior Outpatient Therapy: Prior Outpatient Therapy: Yes Prior Therapy Dates: 2018 Prior Therapy Facilty/Provider(s): [facility] Reason for Treatment: Alcohol use Does patient have an ACCT team?: No Does patient have Intensive In-House Services?  : No Does patient have [facility] services? : Yes (pt states he has not been consistent with his appts ) Does patient have P4CC services?: No  Past Medical History:  Past Medical History:  Diagnosis Date  . Alcohol abuse    Last Drink on Sep 2015  . Anxiety    About 56 years of age  . Carpal tunnel syndrome of right wrist Dx 2015  . Depression    At 56 years of age  . GERD (gastroesophageal reflux  disease) Dx 2007  . Hepatitis, alcoholic, acute 6301  . Hyperlipidemia    borderline, diet controlled  . Pancreatitis Dx 2014  . Pneumonia 11/2014  . 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 Reflux Dx 2007  . 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 carpel tunnel release Right 07/28/2014    done in [place]   . ELBOW SURGERY    . KNEE ARTHROSCOPY    . KNEE SURGERY Left    15 years ago  . LIPOMA EXCISION N/A 05/10/2016   Procedure: EXCISION OF SCALP LIPOMA;  Surgeon: Johnathan Hausen, MD;  Location: [place];  Service: General;  Laterality: N/A;   Family History:  Family History  Problem Relation Age of Onset  . Diabetes Father   . Hypertension Father   . Hypertension Paternal Grandfather   . Alcohol abuse Paternal Grandfather   . Alcohol abuse Maternal Grandfather   . Alcohol abuse Maternal Grandmother   . Alcohol abuse Paternal Grandmother   . Colon cancer Neg Hx   . Rectal cancer Neg Hx   .  Stomach cancer Neg Hx    Family Psychiatric  History:  Unknown  Social History:  History  Alcohol Use  . 0.0 oz/week     History  Drug Use No    Comment: Patient denies     Social History   Social History  . Marital status: Legally Separated    Spouse name: N/A  . Number of children: N/A  . Years of education: N/A   Social History Main Topics  . Smoking status: Former Smoker    Types: Cigars  . Smokeless tobacco: Never Used     Comment: smokes occasional black &mild  . Alcohol use 0.0 oz/week  . Drug use: No     Comment: Patient denies   . Sexual activity: Not Asked   Other Topics Concern  . None   Social History Narrative  . None   Additional Social History:    Allergies:   Allergies  Allergen Reactions  . Oxycodone Other (See Comments)    abd pain, stomach cramps     Labs:  Results for orders placed or performed during the hospital encounter of 07/22/17 (from the past 48  hour(s))  Basic metabolic panel     Status: Abnormal   Collection Time: 07/22/17  7:16 PM  Result Value Ref Range   Sodium 137 135 - 145 mmol/L   Potassium 4.2 3.5 - 5.1 mmol/L   Chloride 102 101 - 111 mmol/L   CO2 23 22 - 32 mmol/L   Glucose, Bld 118 (H) 65 - 99 mg/dL   BUN 11 6 - 20 mg/dL   Creatinine, Ser 1.10 0.61 - 1.24 mg/dL   Calcium 8.8 (L) 8.9 - 10.3 mg/dL   GFR calc non Af Amer >60 >60 mL/min   GFR calc Af Amer >60 >60 mL/min    Comment: (NOTE) The eGFR has been calculated using the CKD EPI equation. This calculation has not been validated in all clinical situations. eGFR's persistently <60 mL/min signify possible Chronic Kidney Disease.    Anion gap 12 5 - 15  CBC     Status: Abnormal   Collection Time: 07/22/17  7:16 PM  Result Value Ref Range   WBC 6.2 4.0 - 10.5 K/uL   RBC 4.87 4.22 - 5.81 MIL/uL   Hemoglobin 12.8 (L) 13.0 - 17.0 g/dL   HCT 39.4 39.0 - 52.0 %   MCV 80.9 78.0 - 100.0 fL   MCH 26.3 26.0 - 34.0 pg   MCHC 32.5 30.0 - 36.0 g/dL   RDW 15.6 (H) 11.5 - 15.5 %   Platelets 179 150 - 400 K/uL  I-stat troponin, ED     Status: None   Collection Time: 07/22/17  7:28 PM  Result Value Ref Range   Troponin i, poc 0.00 0.00 - 0.08 ng/mL   Comment 3            Comment: Due to the release kinetics of cTnI, a negative result within the first hours of the onset of symptoms does not rule out myocardial infarction with certainty. If myocardial infarction is still suspected, repeat the test at appropriate intervals.   Ethanol     Status: Abnormal   Collection Time: 07/22/17  9:10 PM  Result Value Ref Range   Alcohol, Ethyl (B) 359 (HH) <5 mg/dL    Comment:        LOWEST DETECTABLE LIMIT FOR SERUM ALCOHOL IS 5 mg/dL FOR MEDICAL PURPOSES ONLY CRITICAL RESULT CALLED TO, READ BACK BY AND VERIFIED WITH:  DENNIS,A RN 07/22/2017 2248 JORDANS   Rapid urine drug screen (hospital performed)     Status: Abnormal   Collection Time: 07/22/17  9:18 PM  Result Value  Ref Range   Opiates NONE DETECTED NONE DETECTED   Cocaine NONE DETECTED NONE DETECTED   Benzodiazepines POSITIVE (A) NONE DETECTED   Amphetamines NONE DETECTED NONE DETECTED   Tetrahydrocannabinol NONE DETECTED NONE DETECTED   Barbiturates NONE DETECTED NONE DETECTED    Comment:        DRUG SCREEN FOR MEDICAL PURPOSES ONLY.  IF CONFIRMATION IS NEEDED FOR ANY PURPOSE, NOTIFY LAB WITHIN 5 DAYS.        LOWEST DETECTABLE LIMITS FOR URINE DRUG SCREEN Drug Class       Cutoff (ng/mL) Amphetamine      1000 Barbiturate      200 Benzodiazepine   456 Tricyclics       256 Opiates          300 Cocaine          300 THC              50     Medications:  Current Facility-Administered Medications  Medication Dose Route Frequency Provider Last Rate Last Dose  . alum & mag hydroxide-simeth (MAALOX/MYLANTA) 200-200-20 MG/5ML suspension 30 mL  30 mL Oral Q6H PRN Carlisle Cater, PA-C      . ibuprofen (ADVIL,MOTRIN) tablet 600 mg  600 mg Oral Q8H PRN Carlisle Cater, PA-C      . LORazepam (ATIVAN) injection 0-4 mg  0-4 mg Intravenous Q6H Carlisle Cater, PA-C       Or  . LORazepam (ATIVAN) tablet 0-4 mg  0-4 mg Oral Q6H Geiple, Joshua, PA-C      . [START ON 07/25/2017] LORazepam (ATIVAN) injection 0-4 mg  0-4 mg Intravenous Q12H Carlisle Cater, PA-C       Or  . Derrill Memo ON 07/25/2017] LORazepam (ATIVAN) tablet 0-4 mg  0-4 mg Oral Q12H Geiple, Joshua, PA-C      . nicotine (NICODERM CQ - dosed in mg/24 hours) patch 21 mg  21 mg Transdermal Daily Geiple, Joshua, PA-C      . ondansetron (ZOFRAN) tablet 4 mg  4 mg Oral Q8H PRN Carlisle Cater, PA-C      . thiamine (VITAMIN B-1) tablet 100 mg  100 mg Oral Daily Carlisle Cater, PA-C   100 mg at 07/23/17 3893   Current Outpatient Prescriptions  Medication Sig Dispense Refill  . albuterol (PROVENTIL HFA;VENTOLIN HFA) 108 (90 Base) MCG/ACT inhaler Inhale 2 puffs into the lungs every 4 (four) hours as needed for wheezing or shortness of breath. 1 Inhaler 0  .  escitalopram (LEXAPRO) 20 MG tablet Take 1 tablet (20 mg total) by mouth daily. 30 tablet 0  . folic acid (FOLVITE) 1 MG tablet Take 1 tablet (1 mg total) by mouth daily. 30 tablet 5  . gabapentin (NEURONTIN) 300 MG capsule Take 1 capsule (300 mg total) by mouth 3 (three) times daily. 90 capsule 0  . lisinopril (PRINIVIL,ZESTRIL) 5 MG tablet Take 1 tablet (5 mg total) by mouth daily. 30 tablet 0  . naltrexone (DEPADE) 50 MG tablet Take 1 tablet (50 mg total) by mouth daily. 30 tablet 0  . pantoprazole (PROTONIX) 40 MG tablet Take 1 tablet (40 mg total) by mouth daily. 30 tablet 0  . thiamine 100 MG tablet Take 1 tablet (100 mg total) by mouth daily. 30 tablet 0  . traZODone (DESYREL) 50 MG tablet Take 1  tablet (50 mg total) by mouth at bedtime as needed for sleep. 30 tablet 0    Musculoskeletal: UTA via telepsych  Psychiatric Specialty Exam: Physical Exam  Nursing note and vitals reviewed.   Review of Systems  Psychiatric/Behavioral: Positive for depression and substance abuse. Negative for hallucinations, memory loss and suicidal ideas. The patient is nervous/anxious. The patient does not have insomnia.   All other systems reviewed and are negative.   Blood pressure (!) 140/95, pulse 87, temperature 98.3 F (36.8 C), temperature source Oral, resp. rate 14, SpO2 98 %.There is no height or weight on file to calculate BMI.  General Appearance: on hospital gown  Eye Contact:  Good  Speech:  Clear and Coherent and Normal Rate  Volume:  Normal  Mood:  Anxious and Depressed  Affect:  Depressed  Thought Process:  Coherent and Goal Directed  Orientation:  Full (Time, Place, and Person)  Thought Content:  WDL and Logical  Suicidal Thoughts:  No  Homicidal Thoughts:  No  Memory:  Immediate;   Good Recent;   Good Remote;   Fair  Judgement:  Intact  Insight:  Good and Present  Psychomotor Activity:  Normal  Concentration:  Concentration: Good and Attention Span: Good  Recall:  Good   Fund of Knowledge:  Good  Language:  Good  Akathisia:  Negative  Handed:  Right  AIMS (if indicated):     Assets:  Communication Skills Desire for Improvement Resilience Social Support  ADL's:  Intact  Cognition:  WNL  Sleep:        Treatment Plan Summary: Daily contact with patient to assess and evaluate symptoms and progress in treatment, Medication management and Plan to admit patient to a substance abuse treatment facility  -Continue detox protocol and current medications without change.  Disposition: Recommend psychiatric Inpatient admission when medically cleared.  This service was provided via telemedicine using a 2-way, interactive audio and video technology.  Names of all persons participating in this telemedicine service and their role in this encounter. Name: Hason Ofarrell Role: Patient  Name: Hughie Closs NP Role: Provider          Vicenta Aly, NP 07/23/2017 10:14 AM

## 2017-07-23 NOTE — ED Notes (Signed)
Lunch tray given // pt did not eat breakfast tray.

## 2017-07-23 NOTE — ED Notes (Signed)
Breakfast tray ordered 

## 2017-07-23 NOTE — ED Notes (Signed)
TTS in progress at this time.  

## 2017-07-23 NOTE — Progress Notes (Signed)
Per Herbie Baltimore at [facility], patient has been accepted for inpatient SA treatment.   Per Herbie Baltimore, patient must have a 7-day supply of his Lexapro and Lisinopril. Patient must also have an inhaler.   Herbie Baltimore also stated patient must be willing to contract for safety and that he will call CSW back with a time when patient can transport to facility.   Chelsea Felts, RN notified.   Radonna Ricker MSW, [place] Disposition [phone]

## 2017-07-23 NOTE — Progress Notes (Signed)
Per Hughie Closs, NP, the patient meets criteria for inpatient SA treatment. CSW faxed referrals to the following inpatient SA treatment facilities for review:   [facility], [facility]  TTS will continue to seek bed placement.   Radonna Ricker MSW, [place] Disposition [phone]

## 2017-07-24 NOTE — ED Notes (Addendum)
[facility] unable to take patient without 7 days worth of his lisinopril and lexapro medications. Pharmacy unable to dispense that much medication - will have to check with Case Management in the morning regarding filling these prescriptions provided by Dr. Leonette [facility]. Lawson Fiscal [phone]) at [facility] aware that patient will be arriving later today.

## 2017-07-24 NOTE — ED Notes (Signed)
Breakfast ordered 

## 2017-07-24 NOTE — Progress Notes (Addendum)
Patient is accepted to [facility] for inpatient SA treatment.   [facility] sending patient's 7-day supply of Lexapro and Lisinopril to [facility]. Should be there by 12pm.   Patient also needs to have an inhaler once he discharges to [facility] for SA treatment.   RN notified.   Radonna Ricker MSW, [place] Disposition [phone]

## 2017-07-24 NOTE — Discharge Instructions (Signed)
Go to [facility] now.

## 2017-07-24 NOTE — ED Notes (Signed)
Patient was given a snack and drink. 

## 2017-08-09 ENCOUNTER — Ambulatory Visit: Payer: Self-pay

## 2017-08-09 ENCOUNTER — Encounter (HOSPITAL_COMMUNITY): Payer: Self-pay | Admitting: Emergency Medicine

## 2017-08-09 DIAGNOSIS — Z79899 Other long term (current) drug therapy: Secondary | ICD-10-CM | POA: Insufficient documentation

## 2017-08-09 DIAGNOSIS — F10220 Alcohol dependence with intoxication, uncomplicated: Secondary | ICD-10-CM | POA: Insufficient documentation

## 2017-08-09 DIAGNOSIS — Z87891 Personal history of nicotine dependence: Secondary | ICD-10-CM | POA: Insufficient documentation

## 2017-08-09 LAB — COMPREHENSIVE METABOLIC PANEL
ALT: 36 U/L (ref 17–63)
AST: 50 U/L — ABNORMAL HIGH (ref 15–41)
Albumin: 4.2 g/dL (ref 3.5–5.0)
Alkaline Phosphatase: 41 U/L (ref 38–126)
Anion gap: 10 (ref 5–15)
BUN: 9 mg/dL (ref 6–20)
CO2: 26 mmol/L (ref 22–32)
Calcium: 8.7 mg/dL — ABNORMAL LOW (ref 8.9–10.3)
Chloride: 100 mmol/L — ABNORMAL LOW (ref 101–111)
Creatinine, Ser: 1.11 mg/dL (ref 0.61–1.24)
GFR calc Af Amer: >60 mL/min (ref >60)
GFR calc non Af Amer: >60 mL/min (ref >60)
Glucose, Bld: 100 mg/dL — ABNORMAL HIGH (ref 65–99)
Potassium: 4.1 mmol/L (ref 3.5–5.1)
Sodium: 136 mmol/L (ref 135–145)
Total Bilirubin: 0.5 mg/dL (ref 0.3–1.2)
Total Protein: 8 g/dL (ref 6.5–8.1)

## 2017-08-09 LAB — RAPID URINE DRUG SCREEN, HOSP PERFORMED
Amphetamines: NOT DETECTED
Barbiturates: NOT DETECTED
Benzodiazepines: POSITIVE — AB
Cocaine: NOT DETECTED
Opiates: NOT DETECTED
Tetrahydrocannabinol: NOT DETECTED

## 2017-08-09 LAB — CBC
HCT: 39.1 % (ref 39.0–52.0)
Hemoglobin: 12.7 g/dL — ABNORMAL LOW (ref 13.0–17.0)
MCH: 26.1 pg (ref 26.0–34.0)
MCHC: 32.5 g/dL (ref 30.0–36.0)
MCV: 80.3 fL (ref 78.0–100.0)
Platelets: 268 10*3/uL (ref 150–400)
RBC: 4.87 MIL/uL (ref 4.22–5.81)
RDW: 15.6 % — ABNORMAL HIGH (ref 11.5–15.5)
WBC: 7.2 10*3/uL (ref 4.0–10.5)

## 2017-08-09 LAB — ETHANOL: Alcohol, Ethyl (B): 399 mg/dL (ref <5)

## 2017-08-09 NOTE — ED Notes (Signed)
Dr. Eulis Foster are of ETOH 399, no new orders at this time.

## 2017-08-09 NOTE — ED Triage Notes (Signed)
Pt reports he is here for medical clearance, pt will be accepted at [facility] but has to be cleared first. Pt states he drinks beer every day, last drink was this morning. Denies drug use. Denies SI/HI. Endorses HA

## 2017-08-10 ENCOUNTER — Emergency Department (HOSPITAL_COMMUNITY)
Admission: EM | Admit: 2017-08-10 | Discharge: 2017-08-10 | Disposition: A | Discharge status: Home / Self Care | Payer: Self-pay | Attending: Emergency Medicine | Admitting: Emergency Medicine

## 2017-08-10 DIAGNOSIS — F10220 Alcohol dependence with intoxication, uncomplicated: Secondary | ICD-10-CM

## 2017-08-10 MED ORDER — CHLORDIAZEPOXIDE HCL 25 MG PO CAPS: ORAL_CAPSULE | ORAL | 0 refills | Status: DC

## 2017-08-10 NOTE — ED Provider Notes (Signed)
[facility] DEPT Provider Note   CSN: [id number] Arrival date & time: 08/09/17  2109     History   Chief Complaint Chief Complaint  Patient presents with  . Medical Clearance    HPI [name] [name] is a 56 y.o. male.  Patient is a 56 year old male with history of chronic alcoholism presenting requesting alcohol treatment. He tells me he went to day [facility] yesterday, then was told he was intoxicated and they could not except him. He was told to come to the emergency department for medical clearance. He resumed drinking, now presents here requesting assistance. He denies any physical complaints. He denies any suicidal or homicidal ideation.   The history is provided by the patient.  Alcohol Problem  This is a chronic problem. The problem has not changed since onset.Nothing aggravates the symptoms. Nothing relieves the symptoms.    Past Medical History:  Diagnosis Date  . Alcohol abuse    Last Drink on Sep 2015  . Anxiety    About 56 years of age  . Carpal tunnel syndrome of right wrist Dx 2015  . Depression    At 56 years of age  . GERD (gastroesophageal reflux disease) Dx 2007  . Hepatitis, alcoholic, acute 8185  . Hyperlipidemia    borderline, diet controlled  . Pancreatitis Dx 2014  . Pneumonia 11/2014  . 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 Reflux Dx 2007  . Sarcoidosis of lung [facility]) Dx 1989    Patient Active Problem List   Diagnosis Date Noted  . Severe major depression ([place]) 06/30/2017  . Lateral epicondylitis of right elbow 01/05/2017  . Strain, MCP, hand, right, initial encounter 01/05/2017  . 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 Bilateral knee pain 01/11/2016  . Chronic pain of right wrist 07/07/2015  . Carpal tunnel syndrome of right wrist 05/20/2015  . Dislocation of right shoulder joint 05/20/2015  . Tear of medial meniscus of right knee 03/24/2015  . 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[place]) 01/27/2015  . Chronic cough 01/15/2015  . Onychomycosis of  toenail 12/15/2014  . Seborrhea 11/11/2014  . GERD (gastroesophageal reflux disease)   . Alcohol abuse 01/19/2014  . Generalized anxiety disorder 09/02/2013    Past Surgical History:  Procedure Laterality Date  . carpel tunnel release Right 07/28/2014    done in [place]   . ELBOW SURGERY    . KNEE ARTHROSCOPY    . KNEE SURGERY Left    15 years ago  . LIPOMA EXCISION N/A 05/10/2016   Procedure: EXCISION OF SCALP LIPOMA;  Surgeon: Johnathan Hausen, MD;  Location: [place];  Service: General;  Laterality: N/A;       Home Medications    Prior to Admission medications   Medication Sig Start Date End Date Taking? Authorizing Provider  albuterol (PROVENTIL HFA;VENTOLIN HFA) 108 (90 Base) MCG/ACT inhaler Inhale 2 puffs into the lungs every 4 (four) hours as needed for wheezing or shortness of breath. 03/01/17   Janne Napoleon, NP  escitalopram (LEXAPRO) 20 MG tablet Take 1 tablet (20 mg total) by mouth daily. 07/23/17 07/30/17  Fatima Blank, MD  folic acid (FOLVITE) 1 MG tablet Take 1 tablet (1 mg total) by mouth daily. 06/18/17   Funches, Adriana Mccallum, MD  gabapentin (NEURONTIN) 300 MG capsule Take 1 capsule (300 mg total) by mouth 3 (three) times daily. 07/04/17   Mordecai Maes, NP  lisinopril (PRINIVIL,ZESTRIL) 5 MG tablet Take 1 tablet (5 mg total) by mouth daily. 07/23/17 07/30/17  Fatima Blank, MD  naltrexone (DEPADE) 50 MG tablet Take 1 tablet (50 mg total) by mouth daily. 07/05/17   Mordecai Maes, NP  pantoprazole (PROTONIX) 40 MG tablet Take 1 tablet (40 mg total) by mouth daily. 07/05/17   Mordecai Maes, NP  thiamine 100 MG tablet Take 1 tablet (100 mg total) by mouth daily. 07/05/17   Mordecai Maes, NP  traZODone (DESYREL) 50 MG tablet Take 1 tablet (50 mg total) by mouth at bedtime as needed for sleep. 07/04/17   Mordecai Maes, NP    Family History Family History  Problem Relation Age of Onset  . Diabetes Father   . Hypertension Father   .  Hypertension Paternal Grandfather   . Alcohol abuse Paternal Grandfather   . Alcohol abuse Maternal Grandfather   . Alcohol abuse Maternal Grandmother   . Alcohol abuse Paternal Grandmother   . Colon cancer Neg Hx   . Rectal cancer Neg Hx   . Stomach cancer Neg Hx     Social History Social History  Substance Use Topics  . Smoking status: Former Smoker    Types: Cigars  . Smokeless tobacco: Never Used     Comment: smokes occasional black &mild  . Alcohol use 0.0 oz/week     Allergies   Oxycodone   Review of Systems Review of Systems  All other systems reviewed and are negative.    Physical Exam Updated Vital Signs BP (!) 135/91 (BP Location: Right Arm)   Pulse 88   Temp 98.2 F (36.8 C) (Oral)   Resp 16   Ht 5\' 9"  (1.753 m)   Wt 61.2 kg (135 lb)   SpO2 99%   BMI 19.94 kg/m   Physical Exam  Constitutional: He is oriented to person, place, and time. He appears well-developed and well-nourished. No distress.  HENT:  Head: Normocephalic and atraumatic.  Mouth/Throat: Oropharynx is clear and moist.  Neck: Normal range of motion. Neck supple.  Cardiovascular: Normal rate and regular rhythm.  Exam reveals no friction rub.   No murmur heard. Pulmonary/Chest: Effort normal and breath sounds normal. No respiratory distress. He has no wheezes. He has no rales.  Abdominal: Soft. Bowel sounds are normal. He exhibits no distension. There is no tenderness.  Musculoskeletal: Normal range of motion. He exhibits no edema.  Neurological: He is alert and oriented to person, place, and time. Coordination normal.  Skin: Skin is warm and dry. He is not diaphoretic.  Nursing note and vitals reviewed.    ED Treatments / Results  Labs (all labs ordered are listed, but only abnormal results are displayed) Labs Reviewed  COMPREHENSIVE METABOLIC PANEL - Abnormal; Notable for the following:       Result Value   Chloride 100 (*)    Glucose, Bld 100 (*)    Calcium 8.7 (*)    AST  50 (*)    All other components within normal limits  ETHANOL - Abnormal; Notable for the following:    Alcohol, Ethyl (B) 399 (*)    All other components within normal limits  CBC - Abnormal; Notable for the following:    Hemoglobin 12.7 (*)    RDW 15.6 (*)    All other components within normal limits  RAPID URINE DRUG SCREEN, HOSP PERFORMED - Abnormal; Notable for the following:    Benzodiazepines POSITIVE (*)    All other components within normal limits    EKG  EKG Interpretation None       Radiology No results found.  Procedures Procedures (including  critical care time)  Medications Ordered in ED Medications - No data to display   Initial Impression / Assessment and Plan / ED Course  I have reviewed the triage vital signs and the nursing notes.  Pertinent labs & imaging results that were available during my care of the patient were reviewed by me and considered in my medical decision making (see chart for details).  Patient's laboratory studies are unremarkable and physical examination is nonfocal. He does have an alcohol level of 399. He appears medically cleared otherwise and I believe is stable for discharge. He will be given a Librium taper and advised to follow-up tomorrow with [facility].  Final Clinical Impressions(s) / ED Diagnoses   Final diagnoses:  None    New Prescriptions New Prescriptions   No medications on file     Veryl Speak, MD 08/10/17 [phone]

## 2017-08-10 NOTE — Discharge Instructions (Signed)
Librium taper as prescribed.  Go to [facility] tomorrow to discuss treatment options. Refrain from drinking in the meantime.

## 2017-10-22 ENCOUNTER — Ambulatory Visit: Payer: Self-pay | Attending: Nurse Practitioner | Admitting: Nurse Practitioner

## 2017-10-22 ENCOUNTER — Encounter: Payer: Self-pay | Admitting: Nurse Practitioner

## 2017-10-22 VITALS — BP 130/85 | HR 84 | Temp 98.3°F | Resp 18 | Ht 69.0 in | Wt 158.8 lb

## 2017-10-22 DIAGNOSIS — M79601 Pain in right arm: Secondary | ICD-10-CM | POA: Insufficient documentation

## 2017-10-22 DIAGNOSIS — F1020 Alcohol dependence, uncomplicated: Secondary | ICD-10-CM | POA: Insufficient documentation

## 2017-10-22 DIAGNOSIS — K219 Gastro-esophageal reflux disease without esophagitis: Secondary | ICD-10-CM | POA: Insufficient documentation

## 2017-10-22 DIAGNOSIS — E785 Hyperlipidemia, unspecified: Secondary | ICD-10-CM | POA: Insufficient documentation

## 2017-10-22 DIAGNOSIS — F322 Major depressive disorder, single episode, severe without psychotic features: Secondary | ICD-10-CM

## 2017-10-22 DIAGNOSIS — Z79899 Other long term (current) drug therapy: Secondary | ICD-10-CM | POA: Insufficient documentation

## 2017-10-22 DIAGNOSIS — D860 Sarcoidosis of lung: Secondary | ICD-10-CM | POA: Insufficient documentation

## 2017-10-22 DIAGNOSIS — R7303 Prediabetes: Secondary | ICD-10-CM | POA: Insufficient documentation

## 2017-10-22 DIAGNOSIS — F411 Generalized anxiety disorder: Secondary | ICD-10-CM | POA: Insufficient documentation

## 2017-10-22 DIAGNOSIS — G629 Polyneuropathy, unspecified: Secondary | ICD-10-CM | POA: Insufficient documentation

## 2017-10-22 DIAGNOSIS — F1029 Alcohol dependence with unspecified alcohol-induced disorder: Secondary | ICD-10-CM

## 2017-10-22 LAB — POCT GLYCOSYLATED HEMOGLOBIN (HGB A1C): Hemoglobin A1C: 6

## 2017-10-22 MED ORDER — ESCITALOPRAM OXALATE 20 MG PO TABS: 40.0000 mg | ORAL_TABLET | Freq: Every day | ORAL | 2 refills | Status: DC

## 2017-10-22 MED ORDER — TRIAMCINOLONE ACETONIDE 0.1 % EX CREA: 1.0000 "application " | TOPICAL_CREAM | Freq: Two times a day (BID) | CUTANEOUS | 1 refills | Status: DC

## 2017-10-22 MED FILL — ?TRIAMCINOLONE 0.1% CRM: 0.1 | 30 days supply | Qty: 30 | Fill #0

## 2017-10-22 NOTE — Patient Instructions (Addendum)
Gabapentin capsules or tablets What is this medicine? GABAPENTIN (GA ba pen tin) is used to control partial seizures in adults with epilepsy. It is also used to treat certain types of nerve pain. This medicine may be used for other purposes; ask your health care provider or pharmacist if you have questions. COMMON BRAND NAME(S): Active-PAC with Gabapentin, Gabarone, Neurontin What should I tell my health care provider before I take this medicine? They need to know if you have any of these conditions: -kidney disease -suicidal thoughts, plans, or attempt; a previous suicide attempt by you or a family member -an unusual or allergic reaction to gabapentin, other medicines, foods, dyes, or preservatives -pregnant or trying to get pregnant -breast-feeding How should I use this medicine? Take this medicine by mouth with a glass of water. Follow the directions on the prescription label. You can take it with or without food. If it upsets your stomach, take it with food.Take your medicine at regular intervals. Do not take it more often than directed. Do not stop taking except on your doctor's advice. If you are directed to break the 600 or 800 mg tablets in half as part of your dose, the extra half tablet should be used for the next dose. If you have not used the extra half tablet within 28 days, it should be thrown away. A special MedGuide will be given to you by the pharmacist with each prescription and refill. Be sure to read this information carefully each time. Talk to your pediatrician regarding the use of this medicine in children. Special care may be needed. Overdosage: If you think you have taken too much of this medicine contact a poison control center or emergency room at once. NOTE: This medicine is only for you. Do not share this medicine with others. What if I miss a dose? If you miss a dose, take it as soon as you can. If it is almost time for your next dose, take only that dose. Do not  take double or extra doses. What may interact with this medicine? Do not take this medicine with any of the following medications: -other gabapentin products This medicine may also interact with the following medications: -alcohol -antacids -antihistamines for allergy, cough and cold -certain medicines for anxiety or sleep -certain medicines for depression or psychotic disturbances -homatropine; hydrocodone -naproxen -narcotic medicines (opiates) for pain -phenothiazines like chlorpromazine, mesoridazine, prochlorperazine, thioridazine This list may not describe all possible interactions. Give your health care provider a list of all the medicines, herbs, non-prescription drugs, or dietary supplements you use. Also tell them if you smoke, drink alcohol, or use illegal drugs. Some items may interact with your medicine. What should I watch for while using this medicine? Visit your doctor or health care professional for regular checks on your progress. You may want to keep a record at home of how you feel your condition is responding to treatment. You may want to share this information with your doctor or health care professional at each visit. You should contact your doctor or health care professional if your seizures get worse or if you have any new types of seizures. Do not stop taking this medicine or any of your seizure medicines unless instructed by your doctor or health care professional. Stopping your medicine suddenly can increase your seizures or their severity. Wear a medical identification bracelet or chain if you are taking this medicine for seizures, and carry a card that lists all your medications. You may get drowsy, dizzy,   or have blurred vision. Do not drive, use machinery, or do anything that needs mental alertness until you know how this medicine affects you. To reduce dizzy or fainting spells, do not sit or stand up quickly, especially if you are an older patient. Alcohol can  increase drowsiness and dizziness. Avoid alcoholic drinks. Your mouth may get dry. Chewing sugarless gum or sucking hard candy, and drinking plenty of water will help. The use of this medicine may increase the chance of suicidal thoughts or actions. Pay special attention to how you are responding while on this medicine. Any worsening of mood, or thoughts of suicide or dying should be reported to your health care professional right away. Women who become pregnant while using this medicine may enroll in the [place] Pregnancy Registry by calling [phone]. This registry collects information about the safety of antiepileptic drug use during pregnancy. What side effects may I notice from receiving this medicine? Side effects that you should report to your doctor or health care professional as soon as possible: -allergic reactions like skin rash, itching or hives, swelling of the face, lips, or tongue -worsening of mood, thoughts or actions of suicide or dying Side effects that usually do not require medical attention (report to your doctor or health care professional if they continue or are bothersome): -constipation -difficulty walking or controlling muscle movements -dizziness -nausea -slurred speech -tiredness -tremors -weight gain This list may not describe all possible side effects. Call your doctor for medical advice about side effects. You may report side effects to FDA at 1-800-FDA-1088. Where should I keep my medicine? Keep out of reach of children. This medicine may cause accidental overdose and death if it taken by other adults, children, or pets. Mix any unused medicine with a substance like cat litter or coffee grounds. Then throw the medicine away in a sealed container like a sealed bag or a coffee can with a lid. Do not use the medicine after the expiration date. Store at room temperature between 15 and 30 degrees C (59 and 86 degrees F). NOTE: This  sheet is a summary. It may not cover all possible information. If you have questions about this medicine, talk to your doctor, pharmacist, or health care provider.  2018 Elsevier/Gold Standard (2014-01-09 15:26:50)  Neuropathic Pain Neuropathic pain is pain caused by damage to the nerves that are responsible for certain sensations in your body (sensory nerves). The pain can be caused by damage to:  The sensory nerves that send signals to your spinal cord and brain (peripheral nervous system).  The sensory nerves in your brain or spinal cord (central nervous system).  Neuropathic pain can make you more sensitive to pain. What would be a minor sensation for most people may feel very painful if you have neuropathic pain. This is usually a long-term condition that can be difficult to treat. The type of pain can differ from person to person. It may start suddenly (acute), or it may develop slowly and last for a long time (chronic). Neuropathic pain may come and go as damaged nerves heal or may stay at the same level for years. It often causes emotional distress, loss of sleep, and a lower quality of life. What are the causes? The most common cause of damage to a sensory nerve is diabetes. Many other diseases and conditions can also cause neuropathic pain. Causes of neuropathic pain can be classified as:  Toxic. Many drugs and chemicals can cause toxic damage. The most common  cause of toxic neuropathic pain is damage from drug treatment for cancer (chemotherapy).  Metabolic. This type of pain can happen when a disease causes imbalances that damage nerves. Diabetes is the most common of these diseases. Vitamin B deficiency caused by long-term alcohol abuse is another common cause.  Traumatic. Any injury that cuts, crushes, or stretches a nerve can cause damage and pain. A common example is feeling pain after losing an arm or leg (phantom limb pain).  Compression-related. If a sensory nerve gets trapped  or compressed for a long period of time, the blood supply to the nerve can be cut off.  Vascular. Many blood vessel diseases can cause neuropathic pain by decreasing blood supply and oxygen to nerves.  Autoimmune. This type of pain results from diseases in which the body's defense system mistakenly attacks sensory nerves. Examples of autoimmune diseases that can cause neuropathic pain include lupus and multiple sclerosis.  Infectious. Many types of viral infections can damage sensory nerves and cause pain. Shingles infection is a common cause of this type of pain.  Inherited. Neuropathic pain can be a symptom of many diseases that are passed down through families (genetic).  What are the signs or symptoms? The main symptom is pain. Neuropathic pain is often described as:  Burning.  Shock-like.  Stinging.  Hot or cold.  Itching.  How is this diagnosed? No single test can diagnose neuropathic pain. Your health care provider will do a physical exam and ask you about your pain. You may use a pain scale to describe how bad your pain is. You may also have tests to see if you have a high sensitivity to pain and to help find the cause and location of any sensory nerve damage. These tests may include:  Imaging studies, such as: ? X-rays. ? CT scan. ? MRI.  Nerve conduction studies to test how well nerve signals travel through your sensory nerves (electrodiagnostic testing).  Stimulating your sensory nerves through electrodes on your skin and measuring the response in your spinal cord and brain (somatosensory evoked potentials).  How is this treated? Treatment for neuropathic pain may change over time. You may need to try different treatment options or a combination of treatments. Some options include:  Over-the-counter pain relievers.  Prescription medicines. Some medicines used to treat other conditions may also help neuropathic pain. These include medicines to: ? Control seizures  (anticonvulsants). ? Relieve depression (antidepressants).  Prescription-strength pain relievers (narcotics). These are usually used when other pain relievers do not help.  Transcutaneous nerve stimulation (TENS). This uses electrical currents to block painful nerve signals. The treatment is painless.  Topical and local anesthetics. These are medicines that numb the nerves. They can be injected as a nerve block or applied to the skin.  Alternative treatments, such as: ? Acupuncture. ? Meditation. ? Massage. ? Physical therapy. ? Pain management programs. ? Counseling.  Follow these instructions at home:  Learn as much as you can about your condition.  Take medicines only as directed by your health care provider.  Work closely with all your health care providers to find what works best for you.  Have a good support system at home.  Consider joining a chronic pain support group. Contact a health care provider if:  Your pain treatments are not helping.  You are having side effects from your medicines.  You are struggling with fatigue, mood changes, depression, or anxiety. This information is not intended to replace advice given to you by your  health care provider. Make sure you discuss any questions you have with your health care provider. Document Released: 08/10/2004 Document Revised: 06/02/2016 Document Reviewed: 04/23/2014 Elsevier Interactive Patient Education  [employer].

## 2017-10-22 NOTE — Progress Notes (Signed)
Assessment & Plan:   [name] [name] was seen today for establish care.  Diagnoses and all orders for this visit:  Alcohol dependence with unspecified alcohol-induced disorder ([place]) STOP DRINKING Join AA Continue follow up with [facility] for mental health services  Pulmonary sarcoidosis [facility]) -     Ambulatory referral to Pulmonology  Severe major depression ([place]) and Generalized Anxiety Disorder -     escitalopram (LEXAPRO) 20 MG tablet; Take 2 tablets (40 mg total) by mouth daily. DENIES SI/HI Follow up as instructed.   Prediabetes -     HgB A1c  Make sure you are drinking at least 48 oz of water per day. Work on eating a low fat, heart healthy diet and participate in regular aerobic exercise program to control as well. Exercise at least 150 minutes per week. Goal is 5-10% weight loss.   Neuropathy -     Vitamin B12 Patient already has a script for gabapentin. He was instructed to take gabapentin for his neuropathy.  Other orders -     triamcinolone cream (KENALOG) 0.1 %; Apply 1 application topically 2 (two) times daily for skin rash.   Patient has been counseled on age-appropriate routine health concerns for screening and prevention. These are reviewed and up-to-date. Referrals have been placed accordingly. Immunizations are up-to-date or declined.    Subjective:   Chief Complaint  Patient presents with  . Establish Care    Patient stated that he would like to check to see if he have diabetes. Patient also would like to talk about medication refills. Patient also stated that he gets pain in his joints.    HPI [name] [name] 56 y.o. male presents to office today for follow up. He is concerned he may have diabetes due to his family history.    Alcohol Dependence He reports his last drink was 2 months ago after he was discharged from the ED for ETOH intoxication on 08-10-2017. At that time he was discharged with a librium taper and instructed to follow up with [facility] residential  treatment program.  He seems to think his alcoholism is closely related to stress and he also has a strong family history of alcoholism. He has been out of work and reports financial concerns as well as homelessness. I did suggest a buddy system through [place] for support of his alcoholism. He reports he has never joined AA but is open to the suggestion.    Generalized Anxiety Disorder He had an initial appointment for his anxiety and depression (he reports more anxiety than depression) at [facility]. Currently he has not seen a psychiatrist or therapist there as of yet.  He reports significant results with lexapro 20mg  in the past however over the past few months he feels the lexapro is not providing relief of his anxiety as well as it has in the past. I will increase his lexapro at this time to see if this will help relieve his increasing anxiety. He may need an adjunct.   Pulmonary Sarcoidosis His last office visit with Pulmonology was 2016. At that time based on his PFT he was diagnosed with Stage IV disease. [name] [name] was treated at that time with a 4 week prednisone course and was instructed to follow up thereafter with an office visit and CXR. Unfortunately he was lost to follow up. I will place a referral back to pulmonology for follow up and evaluation. He does report some intermittent episodes of DOE and cough however he endorses rare use  of his inhaler.    Right Arm Pain Endorses intermittent right arm pain. He has injured this area in the past in 2015 when he fell and dislocated his right shoulder and he has also had carpal tunnel surgery on the same affected side. Pain is described as aching, shooting, stabbing and tingling, and is intermittent .  Associated symptoms include: none.  The patient has tried nothing for pain relief.  Related to injury: yes.    Review of Systems  Constitutional: Negative for fever, malaise/fatigue and weight loss.  HENT: Negative.  Negative for  nosebleeds.   Eyes: Negative.  Negative for blurred vision, double vision and photophobia.  Respiratory: Positive for cough and shortness of breath.   Cardiovascular: Negative.  Negative for chest pain, palpitations and leg swelling.  Gastrointestinal: Negative.  Negative for abdominal pain, constipation, diarrhea, heartburn, nausea and vomiting.  Musculoskeletal: Negative.  Negative for myalgias.  Skin: Positive for itching and rash (chest).  Neurological: Positive for tingling (He also endorses pins and needles sensation in bilateral great toes.) and sensory change. Negative for dizziness, focal weakness, seizures and headaches.  Endo/Heme/Allergies: Negative for environmental allergies.  Psychiatric/Behavioral: Positive for depression and substance abuse. Negative for hallucinations, memory loss and suicidal ideas. The patient is nervous/anxious. The patient does not have insomnia.     Past Medical History:  Diagnosis Date  . Alcohol abuse    Last Drink on Sep 2015  . Anxiety    About 56 years of age  . Carpal tunnel syndrome of right wrist Dx 2015  . Depression    At 56 years of age  . GERD (gastroesophageal reflux disease) Dx 2007  . Hepatitis, alcoholic, acute 1761  . Hyperlipidemia    borderline, diet controlled  . Pancreatitis Dx 2014  . Pneumonia 11/2014  . 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 Reflux Dx 2007  . 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 carpel tunnel release Right 07/28/2014    done in [place]   . ELBOW SURGERY    . KNEE ARTHROSCOPY    . KNEE SURGERY Left    15 years ago  . LIPOMA EXCISION N/A 05/10/2016   Procedure: EXCISION OF SCALP LIPOMA;  Surgeon: Johnathan Hausen, MD;  Location: [place];  Service: General;  Laterality: N/A;    Family History  Problem Relation Age of Onset  . Diabetes Father   . Hypertension Father   . Hypertension Paternal Grandfather   .  Alcohol abuse Paternal Grandfather   . Alcohol abuse Maternal Grandfather   . Alcohol abuse Maternal Grandmother   . Alcohol abuse Paternal Grandmother   . Colon cancer Neg Hx   . Rectal cancer Neg Hx   . Stomach cancer Neg Hx     Social History Reviewed with no changes to be made today.   Outpatient Medications Prior to Visit  Medication Sig Dispense Refill  . fluticasone (FLONASE) 50 MCG/ACT nasal spray Place 2 sprays into both nostrils as needed for allergies or rhinitis.    Marland Kitchen albuterol (PROVENTIL HFA;VENTOLIN HFA) 108 (90 Base) MCG/ACT inhaler Inhale 2 puffs into the lungs every 4 (four) hours as needed for wheezing or shortness of breath. (Patient not taking: Reported on 10/22/2017) 1 Inhaler 0  . gabapentin (NEURONTIN) 300 MG capsule Take 1 capsule (300 mg total) by mouth 3 (three) times daily. (Patient not taking: Reported on 10/22/2017) 90 capsule 0  . pantoprazole (  PROTONIX) 40 MG tablet Take 1 tablet (40 mg total) by mouth daily. (Patient not taking: Reported on 10/22/2017) 30 tablet 0  . traZODone (DESYREL) 50 MG tablet Take 1 tablet (50 mg total) by mouth at bedtime as needed for sleep. (Patient not taking: Reported on 10/22/2017) 30 tablet 0  . chlordiazePOXIDE (LIBRIUM) 25 MG capsule 50mg  PO TID x 1D, then 25-50mg  PO BID X 1D, then 25-50mg  PO QD X 1D (Patient not taking: Reported on 10/22/2017) 10 capsule 0  . escitalopram (LEXAPRO) 20 MG tablet Take 1 tablet (20 mg total) by mouth daily. 7 tablet 0  . folic acid (FOLVITE) 1 MG tablet Take 1 tablet (1 mg total) by mouth daily. (Patient not taking: Reported on 10/22/2017) 30 tablet 5  . lisinopril (PRINIVIL,ZESTRIL) 5 MG tablet Take 1 tablet (5 mg total) by mouth daily. 7 tablet 0  . naltrexone (DEPADE) 50 MG tablet Take 1 tablet (50 mg total) by mouth daily. (Patient not taking: Reported on 10/22/2017) 30 tablet 0  . thiamine 100 MG tablet Take 1 tablet (100 mg total) by mouth daily. (Patient not taking: Reported on 10/22/2017)  30 tablet 0   No facility-administered medications prior to visit.     Allergies  Allergen Reactions  . Oxycodone Other (See Comments)    abd pain, stomach cramps        Objective:    BP 130/85 (BP Location: Left Arm, Patient Position: Sitting, Cuff Size: Normal)   Pulse 84   Temp 98.3 F (36.8 C) (Oral)   Resp 18   Ht 5\' 9"  (1.753 m)   Wt 158 lb 12.8 oz (72 kg)   SpO2 99%   BMI 23.45 kg/m  Wt Readings from Last 3 Encounters:  10/22/17 158 lb 12.8 oz (72 kg)  08/09/17 135 lb (61.2 kg)  06/18/17 142 lb 12.8 oz (64.8 kg)    Physical Exam  Constitutional: He is oriented to person, place, and time. He appears well-developed and well-nourished. He is cooperative.  HENT:  Head: Normocephalic and atraumatic.  Eyes: EOM are normal.  Neck: Normal range of motion.  Cardiovascular: Normal rate, regular rhythm, normal heart sounds and intact distal pulses. Exam reveals no gallop and no friction rub.  No murmur heard. Pulmonary/Chest: Effort normal and breath sounds normal. No tachypnea. No respiratory distress. He has no decreased breath sounds. He has no wheezes. He has no rhonchi. He has no rales. He exhibits no tenderness.  Abdominal: Soft. Bowel sounds are normal.  Musculoskeletal: Normal range of motion. He exhibits no edema.  Neurological: He is alert and oriented to person, place, and time. Coordination normal.  Skin: Skin is warm and dry. Rash noted. Rash is macular (on chest).  Psychiatric: He has a normal mood and affect. His behavior is normal. Judgment and thought content normal. He expresses no homicidal and no suicidal ideation. He expresses no suicidal plans and no homicidal plans.  Nursing note and vitals reviewed.      Patient has been counseled extensively about nutrition and exercise as well as the importance of adherence with medications and regular follow-up. The patient was given clear instructions to go to ER or return to medical center if symptoms don't  improve, worsen or new problems develop. The patient verbalized understanding.   Follow-up: Return in about 3 weeks (around 11/12/2017) for Needs appointment with financial representative.Gildardo Pounds, FNP-BC [facility] and [facility] [place], [place]

## 2017-10-23 ENCOUNTER — Telehealth: Payer: Self-pay

## 2017-10-23 ENCOUNTER — Encounter: Payer: Self-pay | Admitting: Nurse Practitioner

## 2017-10-23 LAB — VITAMIN B12: Vitamin B-12: 424 pg/mL (ref 232–1245)

## 2017-10-23 NOTE — Telephone Encounter (Signed)
Patient informed on lab result.   Patient verified DOB.

## 2017-10-23 NOTE — Telephone Encounter (Signed)
-----   Message from Gildardo Pounds, NP sent at 10/23/2017  1:59 PM EST ----- Vitamin B12 is normal. Your numbness and tingling may be long term effects from previous alcohol use. We will continue to monitor and try gabapentin for your symptoms.

## 2017-10-29 MED FILL — ESCITALOPRAM 20 MG TABLET: 20 | 30 days supply | Qty: 30 | Fill #0

## 2017-10-30 ENCOUNTER — Ambulatory Visit: Payer: Self-pay | Attending: Nurse Practitioner

## 2017-11-06 ENCOUNTER — Institutional Professional Consult (permissible substitution): Payer: Self-pay | Admitting: Internal Medicine

## 2017-11-12 ENCOUNTER — Encounter: Payer: Self-pay | Admitting: Nurse Practitioner

## 2017-11-12 ENCOUNTER — Ambulatory Visit: Payer: Self-pay | Attending: Nurse Practitioner | Admitting: Nurse Practitioner

## 2017-11-12 VITALS — BP 151/91 | HR 71 | Temp 98.5°F | Ht 69.0 in | Wt 160.8 lb

## 2017-11-12 DIAGNOSIS — F329 Major depressive disorder, single episode, unspecified: Secondary | ICD-10-CM | POA: Insufficient documentation

## 2017-11-12 DIAGNOSIS — Z0000 Encounter for general adult medical examination without abnormal findings: Secondary | ICD-10-CM | POA: Insufficient documentation

## 2017-11-12 DIAGNOSIS — F419 Anxiety disorder, unspecified: Secondary | ICD-10-CM | POA: Insufficient documentation

## 2017-11-12 DIAGNOSIS — E785 Hyperlipidemia, unspecified: Secondary | ICD-10-CM | POA: Insufficient documentation

## 2017-11-12 DIAGNOSIS — K219 Gastro-esophageal reflux disease without esophagitis: Secondary | ICD-10-CM | POA: Insufficient documentation

## 2017-11-12 DIAGNOSIS — R351 Nocturia: Secondary | ICD-10-CM | POA: Insufficient documentation

## 2017-11-12 DIAGNOSIS — Z79899 Other long term (current) drug therapy: Secondary | ICD-10-CM | POA: Insufficient documentation

## 2017-11-12 DIAGNOSIS — Z23 Encounter for immunization: Secondary | ICD-10-CM | POA: Insufficient documentation

## 2017-11-12 NOTE — Progress Notes (Signed)
Assessment & Plan:  Carsen was seen today for annual exam.  Diagnoses and all orders for this visit:  Encounter for annual physical exam  Anxiety -     Ambulatory referral to Psychiatry  Nocturia more than twice per night -     PSA  Immunization due -     Flu Vaccine QUAD 6+ mos PF IM (Fluarix Quad PF)  Other orders -     mupirocin cream (BACTROBAN) 2 %; Apply 1 application topically 3 (three) times daily for 10 days.    Patient has been counseled on age-appropriate routine health concerns for screening and prevention. These are reviewed and up-to-date. Referrals have been placed accordingly. Immunizations are up-to-date or declined.    Subjective:   Chief Complaint  Patient presents with  . Annual Exam    Patient is here for a physical. Patient stated he may have twisted his left ankle two months ago and its swollen and pain when walk. Patient stated that his left knee is in pain and he would like to discuss about medication Lexapro.   HPI Ethel Veronica 56 y.o. male presents to office today for a wellness exam. He is currently taking lexapro 40mg . He feels this is not controlling his anxiety. I am reluctant to put him on any anxiolytics as he has a history of alcohol abuse. He has currently been sober per his report for several months now. He endorses symptoms of lack of focus/decreased concentration, hyperactivity, racing thoughts, difficulty getting organized and staying on task. He needs to be evaluated for possible ADD/ADHD and SSRIs may not be a good choice for his symptoms. He reports he has experienced these symptoms for many years even as a child and was labeled with a learning disability. I will place a referral for psych and not make any additional medication changes at this time.   Review of Systems  Constitutional: Negative for fever, malaise/fatigue and weight loss.  HENT: Negative.  Negative for nosebleeds.   Eyes: Negative.  Negative for blurred vision, double  vision and photophobia.  Respiratory: Negative.  Negative for cough and shortness of breath.   Cardiovascular: Negative.  Negative for chest pain, palpitations and leg swelling.  Gastrointestinal: Negative.  Negative for abdominal pain, constipation, diarrhea, heartburn, nausea and vomiting.  Genitourinary: Negative for dysuria, flank pain, hematuria and urgency.       Nocturia  Musculoskeletal: Positive for joint pain. Negative for myalgias.       Left ankle  Neurological: Negative.  Negative for dizziness, focal weakness, seizures and headaches.  Endo/Heme/Allergies: Negative for environmental allergies.  Psychiatric/Behavioral: Positive for memory loss and substance abuse (history of). Negative for suicidal ideas. The patient is nervous/anxious and has insomnia (Not taking trazodone).        Decreased focus, concentration, hyperactivity    Past Medical History:  Diagnosis Date  . Alcohol abuse    Last Drink on Sep 2018  . Anxiety    About 56 years of age  . Carpal tunnel syndrome of right wrist Dx 2015  . Depression    At 56 years of age  . GERD (gastroesophageal reflux disease) Dx 2007  . Hepatitis, alcoholic, acute 5366  . Hyperlipidemia    borderline, diet controlled  . Pancreatitis Dx 2014  . Pneumonia 11/2014  . 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 Reflux Dx 2007  . 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 carpel tunnel  release Right 07/28/2014    done in [place]   . ELBOW SURGERY    . KNEE ARTHROSCOPY    . KNEE SURGERY Left    15 years ago  . LIPOMA EXCISION N/A 05/10/2016   Procedure: EXCISION OF SCALP LIPOMA;  Surgeon: Johnathan Hausen, MD;  Location: [place];  Service: General;  Laterality: N/A;    Family History  Problem Relation Age of Onset  . Diabetes Father   . Hypertension Father   . Hypertension Paternal Grandfather   . Alcohol abuse Paternal Grandfather   .  Alcohol abuse Maternal Grandfather   . Alcohol abuse Maternal Grandmother   . Alcohol abuse Paternal Grandmother   . Colon cancer Neg Hx   . Rectal cancer Neg Hx   . Stomach cancer Neg Hx     Social History Reviewed with no changes to be made today.   Outpatient Medications Prior to Visit  Medication Sig Dispense Refill  . escitalopram (LEXAPRO) 20 MG tablet Take 2 tablets (40 mg total) by mouth daily. 60 tablet 2  . fluticasone (FLONASE) 50 MCG/ACT nasal spray Place 2 sprays into both nostrils as needed for allergies or rhinitis.    Marland Kitchen gabapentin (NEURONTIN) 300 MG capsule Take 1 capsule (300 mg total) by mouth 3 (three) times daily. 90 capsule 0  . albuterol (PROVENTIL HFA;VENTOLIN HFA) 108 (90 Base) MCG/ACT inhaler Inhale 2 puffs into the lungs every 4 (four) hours as needed for wheezing or shortness of breath. (Patient not taking: Reported on 10/22/2017) 1 Inhaler 0  . pantoprazole (PROTONIX) 40 MG tablet Take 1 tablet (40 mg total) by mouth daily. (Patient not taking: Reported on 10/22/2017) 30 tablet 0  . traZODone (DESYREL) 50 MG tablet Take 1 tablet (50 mg total) by mouth at bedtime as needed for sleep. (Patient not taking: Reported on 10/22/2017) 30 tablet 0  . triamcinolone cream (KENALOG) 0.1 % Apply 1 application topically 2 (two) times daily. (Patient not taking: Reported on 11/12/2017) 30 g 1   No facility-administered medications prior to visit.     Allergies  Allergen Reactions  . Oxycodone Other (See Comments)    abd pain, stomach cramps        Objective:    BP (!) 151/91 (BP Location: Left Arm, Patient Position: Sitting, Cuff Size: Normal)   Pulse 71   Temp 98.5 F (36.9 C) (Oral)   Ht 5\' 9"  (1.753 m)   Wt 160 lb 12.8 oz (72.9 kg)   SpO2 98%   BMI 23.75 kg/m  Wt Readings from Last 3 Encounters:  11/12/17 160 lb 12.8 oz (72.9 kg)  10/22/17 158 lb 12.8 oz (72 kg)  08/09/17 135 lb (61.2 kg)    Physical Exam  Constitutional: He is oriented to person,  place, and time. He appears well-developed and well-nourished. He is cooperative.  HENT:  Head: Normocephalic and atraumatic.  Eyes: EOM are normal.  Neck: Normal range of motion.  Cardiovascular: Normal rate, regular rhythm, normal heart sounds and intact distal pulses. Exam reveals no gallop and no friction rub.  No murmur heard. Pulmonary/Chest: Effort normal and breath sounds normal. No tachypnea. No respiratory distress. He has no decreased breath sounds. He has no wheezes. He has no rhonchi. He has no rales. He exhibits no tenderness.    Abdominal: Soft. Normal appearance and bowel sounds are normal. There is no hepatosplenomegaly, splenomegaly or hepatomegaly. There is no tenderness. There is no rigidity, no rebound, no guarding, no CVA tenderness, no  tenderness at McBurney's point and negative Murphy's sign. No hernia. Hernia confirmed negative in the ventral area, confirmed negative in the right inguinal area and confirmed negative in the left inguinal area.  Genitourinary: Testes normal and penis normal. Circumcised.  Musculoskeletal: Normal range of motion. He exhibits no edema.       Left ankle: He exhibits swelling (mild swelling). He exhibits normal range of motion, no ecchymosis, no deformity, no laceration and normal pulse. No tenderness. No lateral malleolus tenderness found.  Lymphadenopathy:       Right: No inguinal adenopathy present.       Left: No inguinal adenopathy present.  Neurological: He is alert and oriented to person, place, and time. Coordination normal.  Skin: Skin is warm and dry.  Psychiatric: He has a normal mood and affect. His behavior is normal. Judgment and thought content normal.  Nursing note and vitals reviewed.     Patient has been counseled extensively about nutrition and exercise as well as the importance of adherence with medications and regular follow-up. The patient was given clear instructions to go to ER or return to medical center if symptoms  don't improve, worsen or new problems develop. The patient verbalized understanding.   Follow-up: Return if symptoms worsen or fail to improve.   Gildardo Pounds, FNP-BC [facility] and [facility] [place], [place]   11/13/2017, 9:19 AM

## 2017-11-13 ENCOUNTER — Encounter: Payer: Self-pay | Admitting: Nurse Practitioner

## 2017-11-13 LAB — PSA: Prostate Specific Ag, Serum: 2.9 ng/mL (ref 0.0–4.0)

## 2017-11-13 MED ORDER — MUPIROCIN CALCIUM 2 % EX CREA: 1.0000 "application " | TOPICAL_CREAM | Freq: Three times a day (TID) | CUTANEOUS | 0 refills | Status: AC

## 2017-11-14 ENCOUNTER — Telehealth: Payer: Self-pay

## 2017-11-14 NOTE — Telephone Encounter (Signed)
Patient informed on lab result.   Patient verified DOB.

## 2017-11-14 NOTE — Telephone Encounter (Signed)
-----   Message from Gildardo Pounds, NP sent at 11/13/2017  5:38 PM EST ----- PSA is negative for prostate cancer.

## 2017-12-05 ENCOUNTER — Encounter: Payer: Self-pay | Admitting: Internal Medicine

## 2017-12-05 ENCOUNTER — Ambulatory Visit (INDEPENDENT_AMBULATORY_CARE_PROVIDER_SITE_OTHER): Payer: No Typology Code available for payment source | Admitting: Internal Medicine

## 2017-12-05 VITALS — BP 128/74 | HR 65 | Ht 69.0 in | Wt 155.0 lb

## 2017-12-05 DIAGNOSIS — R05 Cough: Secondary | ICD-10-CM

## 2017-12-05 DIAGNOSIS — R053 Chronic cough: Secondary | ICD-10-CM

## 2017-12-05 DIAGNOSIS — D869 Sarcoidosis, unspecified: Secondary | ICD-10-CM

## 2017-12-05 DIAGNOSIS — D863 Sarcoidosis of skin: Secondary | ICD-10-CM

## 2017-12-05 DIAGNOSIS — R0602 Shortness of breath: Secondary | ICD-10-CM

## 2017-12-05 DIAGNOSIS — D860 Sarcoidosis of lung: Secondary | ICD-10-CM

## 2017-12-05 MED ORDER — MOMETASONE FUROATE 100 MCG/ACT IN AERO: 2.0000 | INHALATION_SPRAY | Freq: Two times a day (BID) | RESPIRATORY_TRACT | 0 refills | Status: DC

## 2017-12-05 NOTE — Patient Instructions (Addendum)
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HCC) Chronic cough Dyspnea   - try sample of inhaled steroid pulmicort or asmanex 2 puff twice daily for 4 weeks; take sampls - do full PFT anytime next few months - get echo to rule out pulmonary hypertension  Sarcoidosis with lupus pernio - refer dermatologist for biopsy of rash in chest.  Followup 3 months but after completing all of above

## 2017-12-05 NOTE — Addendum Note (Signed)
Addended by: Lorretta Harp on: 12/05/2017 12:17 PM   Modules accepted: Orders

## 2017-12-05 NOTE — Progress Notes (Signed)
Subjective:    Patient ID: [name] [name], male    DOB: [birth year], 57 y.o.   MRN: [id number]  HPI  HPI 3/2/216 The patient is a 57 year old male who I've been asked to see for management of pulmonary sarcoidosis. He tells me that he was diagnosed in [place] over 15 years ago, and describes fiberoptic bronchoscopy with biopsy. He was apparently treated with prednisone for about 6 months, and then weaned off the medication. He is unsure if this made a difference in symptoms or not. He has really not been on prednisone consistently over the years, but has noticed worsening dyspnea on exertion.  It has now gotten to the point where it is impacting his daily life. He describes a 3 block dyspnea on exertion at a moderate pace on flat ground. He can also get winded walking up a flight of stairs, and at times bringing groceries in from the car. He feels a tightness in his chest that is always there, but has tried inhalers that have not made a big difference. In October of last year he started with a progressive cough that was primarily dry, and this has persisted. It is worse with laughing, singing, and has never really produced any significant quantities of mucus. He feels there is a tickle in his throat and upper chest that causes the cough. He has had a chest x-ray that shows the classic changes of stage IV sarcoidosis, and has also shown a little more density in the left upper lobe of unknown significance. He does not have any history that is suggestive of pneumonia. He has had a CT chest in October of last year that showed significant mediastinal and hilar lymphadenopathy with calcification. He also has some mild interstitial disease more pronounced in the upper over the lower lobes, with classic fibrotic changes in the upper lobe consistent with stage IV sarcoid. There is upper lobe bronchiectasis as well. The patient has not had PFTs since his initial diagnosis. Unfortunately, he continues to smoke at times,  but not on a regular basis  OV 02/11/15: The patient comes in today for follow-up of his recent PFTs. He carries the diagnosis of sarcoidosis, and by his radiographic appearance this is most consistent with stage IV disease. He has had worsening dyspnea on exertion as well as a dry cough that was initially felt to be upper airway in origin. He was tried on antihistamines for postnasal drip, and also asked to try behavioral therapies for cyclical coughing. I had also asked him to quit smoking. He comes in today where he tells me that he is no longer smoking, and has stuck with the recommendations from the last visit. He really has not seen a difference in his cough. His PFTs today showed no airflow obstruction, mild restriction, and a mild reduction in his diffusion capacity..    Tx of care 12/05/2017 to Dr Chase Caller for sarcoidosis  Chief Complaint  Patient presents with  . Advice Only    Referred by Dr. Raul Del for pulm. sarcoidosis.  States that he has been coughing off and on which started back x1 week.    57 year old [place] American male.  He is a transfer of care for sarcoidosis.  3 years ago in March 2016 he saw Dr. 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  He says in the interim overall he has been stable.  He reports to have just a chronic mild intermittent cough with severe exertion  or with laughing or talking.  He has mild exertional dyspnea.  He is recently joined the staff at [employer] and he plans to work out but he has been overall active without any problems.  He does establish with a primary care physician and was asked to come in follow-up with [place] for sarcoidosis.  Of note he is denying any palpitations or kidney issues or bone issues or neurologic issues from his sarcoidosis.  He has however noticed on and off rash in his upper chest in the front in the last few months.  These are persisting.  He thinks it might be acne.   He is not aware this could be lupus pernio.  His last set of pulmonary function test was in December 2017 and that was consistent with isolated reduction in diffusion capacity with some mild restriction.  His last CT scan of the chest was in October 2015: That I personally visualized and shows findings consistent with stage IV burned-out sarcoidosis.  His most recent chest x-ray in August 2018 showed bilateral upper lobe scarring with some honeycombing.  He only has an albuterol inhaler that he does not use.       has a past medical history of Alcohol abuse, Anxiety, Carpal tunnel syndrome of right wrist (Dx 2015), Depression, GERD (gastroesophageal reflux disease) (Dx 2007), Hepatitis, alcoholic, acute (0350), Hyperlipidemia, Pancreatitis (Dx 2014), Pneumonia (11/2014), Recurrent anterior dislocation of shoulder (05/15/2015), Reflux (Dx 2007), and Sarcoidosis of lung ([place]) (Dx 1989).   reports that  has never smoked. he has never used smokeless tobacco.  Past Surgical History:  Procedure Laterality Date  . carpel tunnel release Right 07/28/2014    done in [place]   . ELBOW SURGERY    . KNEE ARTHROSCOPY    . KNEE SURGERY Left    15 years ago  . LIPOMA EXCISION N/A 05/10/2016   Procedure: EXCISION OF SCALP LIPOMA;  Surgeon: Johnathan Hausen, MD;  Location: [facility];  Service: General;  Laterality: N/A;    Allergies  Allergen Reactions  . Oxycodone Other (See Comments)    abd pain, stomach cramps     Immunization History  Administered Date(s) Administered  . Influenza,inj,Quad PF,6+ Mos 11/10/2014, 09/27/2015, 07/28/2016, 11/12/2017  . Pneumococcal Polysaccharide-23 11/10/2014, 07/01/2017  . Tdap 07/07/2015    Family History  Problem Relation Age of Onset  . Diabetes Father   . Hypertension Father   . Hypertension Paternal Grandfather   . Alcohol abuse Paternal Grandfather   . Alcohol abuse Maternal Grandfather   . Alcohol abuse Maternal Grandmother   . Alcohol  abuse Paternal Grandmother   . Colon cancer Neg Hx   . Rectal cancer Neg Hx   . Stomach cancer Neg Hx      Current Outpatient Medications:  .  albuterol (PROVENTIL HFA;VENTOLIN HFA) 108 (90 Base) MCG/ACT inhaler, Inhale 2 puffs into the lungs every 4 (four) hours as needed for wheezing or shortness of breath., Disp: 1 Inhaler, Rfl: 0 .  escitalopram (LEXAPRO) 20 MG tablet, Take 20 mg by mouth daily., Disp: , Rfl: 5 .  fluticasone (FLONASE) 50 MCG/ACT nasal spray, Place 2 sprays into both nostrils as needed for allergies or rhinitis., Disp: , Rfl:  .  triamcinolone cream (KENALOG) 0.1 %, Apply 1 application topically 2 (two) times daily., Disp: 30 g, Rfl: 1 .  escitalopram (LEXAPRO) 20 MG tablet, Take 2 tablets (40 mg total) by mouth daily., Disp: 60 tablet, Rfl: 2    Review of Systems  Constitutional: Negative for fever and unexpected weight change.  HENT: Positive for postnasal drip. Negative for congestion, dental problem, ear pain, nosebleeds, rhinorrhea, sinus pressure, sneezing, sore throat and trouble swallowing.   Eyes: Negative for redness and itching.  Respiratory: Positive for cough and shortness of breath. Negative for chest tightness and wheezing.   Cardiovascular: Negative for palpitations and leg swelling.  Gastrointestinal: Negative for nausea and vomiting.  Genitourinary: Negative for dysuria.  Musculoskeletal: Positive for joint swelling.  Skin: Positive for rash.  Allergic/Immunologic: Negative.  Negative for environmental allergies, food allergies and immunocompromised state.  Neurological: Negative for headaches.  Hematological: Does not bruise/bleed easily.  Psychiatric/Behavioral: Negative for dysphoric mood. The patient is nervous/anxious.        Objective:   Physical Exam  Constitutional: He is oriented to person, place, and time. He appears well-developed and well-nourished. No distress.  HENT:  Head: Normocephalic and atraumatic.  Right Ear: External  ear normal.  Left Ear: External ear normal.  Mouth/Throat: Oropharynx is clear and moist. No oropharyngeal exudate.  Eyes: Conjunctivae and EOM are normal. Pupils are equal, round, and reactive to light. Right eye exhibits no discharge. Left eye exhibits no discharge. No scleral icterus.  Neck: Normal range of motion. Neck supple. No JVD present. No tracheal deviation present. No thyromegaly present.  Cardiovascular: Normal rate, regular rhythm and intact distal pulses. Exam reveals no gallop and no friction rub.  No murmur heard. Pulmonary/Chest: Effort normal and breath sounds normal. No respiratory distress. He has no wheezes. He has no rales. He exhibits no tenderness.  squakes left upper chest  Abdominal: Soft. Bowel sounds are normal. He exhibits no distension and no mass. There is no tenderness. There is no rebound and no guarding.  Musculoskeletal: Normal range of motion. He exhibits no edema or tenderness.  Lymphadenopathy:    He has no cervical adenopathy.  Neurological: He is alert and oriented to person, place, and time. He has normal reflexes. No cranial nerve deficit. Coordination normal.  Skin: Skin is warm and dry. No rash noted. He is not diaphoretic. No erythema. No pallor.  ? Lupus pernio on chest  Psychiatric: He has a normal mood and affect. His behavior is normal. Judgment and thought content normal.  Nursing note and vitals reviewed.   Vitals:   12/05/17 1111  BP: 128/74  Pulse: 65  SpO2: 96%  Weight: 155 lb (70.3 kg)  Height: 5\' 9"  (1.753 m)         Assessment & Plan:     ICD-10-CM   1. 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[place]) D86.0   2. Chronic cough R05   3. 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 Shortness of breath R06.02     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[facility]) Chronic cough Dyspnea   - try sample of inhaled steroid pulmicort or asmanex 2 puff twice daily for 4 weeks; take sampls - do full PFT anytime next few months - get echo to rule out pulmonary  hypertension  Sarcoidosis with lupus pernio - refer dermatologist for biopsy of rash in chest.  Followup 3 months but after completing all of above   > 50% of this > 25 min visit spent in face to face counseling or coordination of care    Dr. Brand Males, M.D., [facility].C.P Pulmonary and Critical Care Medicine Staff Physician, [place] Director - Interstitial Lung Disease  Program  [facility] [place], [place], [address]  Pager: [phone], If  no answer or between  15:00h - 7:00h: call [phone] Telephone: [phone]

## 2017-12-07 ENCOUNTER — Other Ambulatory Visit (HOSPITAL_COMMUNITY): Payer: Self-pay

## 2017-12-12 ENCOUNTER — Telehealth: Payer: Self-pay | Admitting: Nurse Practitioner

## 2017-12-12 ENCOUNTER — Other Ambulatory Visit: Payer: Self-pay | Admitting: Nurse Practitioner

## 2017-12-12 DIAGNOSIS — R21 Rash and other nonspecific skin eruption: Secondary | ICD-10-CM

## 2017-12-12 MED ORDER — MUPIROCIN 2 % EX OINT: TOPICAL_OINTMENT | CUTANEOUS | 0 refills | Status: DC

## 2017-12-12 MED FILL — MUPIROCIN 2% OINTMENT: 2 | 15 days supply | Qty: 22 | Fill #0

## 2017-12-12 NOTE — Telephone Encounter (Signed)
I called the patient back as well no answer.

## 2017-12-12 NOTE — Telephone Encounter (Signed)
Patient called asking for a dermatology referral. Bumps on chest and the pulmonologist wants a biopsy of the bumps. Please call patient back.

## 2017-12-12 NOTE — Telephone Encounter (Signed)
Patient called and said his right knee is in pain.  Patient stated the pain is a sharp pain. Patient would like to know if he need to come in for an office visit or he can get an xray done.

## 2017-12-12 NOTE — Telephone Encounter (Signed)
CMA called patient back. Patient did not answer and left a call back number.

## 2017-12-12 NOTE — Telephone Encounter (Signed)
CMA attempt to call patient. Patient did not answer and left a voicemail for patient to call back.

## 2017-12-17 ENCOUNTER — Encounter: Payer: Self-pay | Admitting: *Deleted

## 2017-12-17 ENCOUNTER — Ambulatory Visit (INDEPENDENT_AMBULATORY_CARE_PROVIDER_SITE_OTHER): Payer: No Typology Code available for payment source | Admitting: Internal Medicine

## 2017-12-17 DIAGNOSIS — R0602 Shortness of breath: Secondary | ICD-10-CM

## 2017-12-17 DIAGNOSIS — D860 Sarcoidosis of lung: Secondary | ICD-10-CM

## 2017-12-17 LAB — PULMONARY FUNCTION TEST
DL/VA % pred: 85 %
DL/VA: 3.9 ml/min/mmHg/L
DLCO cor % pred: 68 %
DLCO cor: 21.35 ml/min/mmHg
DLCO unc % pred: 71 %
DLCO unc: 22.09 ml/min/mmHg
FEF 25-75 Post: 2.25 L/sec
FEF 25-75 Pre: 1.86 L/sec
FEF2575-%Change-Post: 20 %
FEF2575-%Pred-Post: 75 %
FEF2575-%Pred-Pre: 62 %
FEV1-%Change-Post: 5 %
FEV1-%Pred-Post: 98 %
FEV1-%Pred-Pre: 93 %
FEV1-Post: 3.06 L
FEV1-Pre: 2.89 L
FEV1FVC-%Change-Post: 6 %
FEV1FVC-%Pred-Pre: 87 %
FEV6-%Change-Post: 0 %
FEV6-%Pred-Post: 107 %
FEV6-%Pred-Pre: 108 %
FEV6-Post: 4.11 L
FEV6-Pre: 4.13 L
FEV6FVC-%Change-Post: 0 %
FEV6FVC-%Pred-Post: 102 %
FEV6FVC-%Pred-Pre: 101 %
FVC-%Change-Post: -1 %
FVC-%Pred-Post: 104 %
FVC-%Pred-Pre: 106 %
FVC-Post: 4.13 L
FVC-Pre: 4.18 L
Post FEV1/FVC ratio: 74 %
Post FEV6/FVC ratio: 100 %
Pre FEV1/FVC ratio: 69 %
Pre FEV6/FVC Ratio: 99 %
RV % pred: 68 %
RV: 1.44 L
TLC % pred: 84 %
TLC: 5.7 L

## 2017-12-17 NOTE — Telephone Encounter (Signed)
CMA called patient and no answer.  Left a voicemail for patient to schedule appointment.

## 2017-12-17 NOTE — Progress Notes (Signed)
PFT completed today.  

## 2017-12-17 NOTE — Telephone Encounter (Signed)
Needs to make an appointment.

## 2017-12-18 ENCOUNTER — Ambulatory Visit (HOSPITAL_COMMUNITY): Payer: No Typology Code available for payment source | Attending: Internal Medicine

## 2017-12-18 ENCOUNTER — Other Ambulatory Visit: Payer: Self-pay

## 2017-12-18 ENCOUNTER — Encounter (HOSPITAL_COMMUNITY): Payer: Self-pay | Admitting: Radiology

## 2017-12-18 DIAGNOSIS — F1010 Alcohol abuse, uncomplicated: Secondary | ICD-10-CM | POA: Insufficient documentation

## 2017-12-18 DIAGNOSIS — R0602 Shortness of breath: Secondary | ICD-10-CM | POA: Insufficient documentation

## 2017-12-18 DIAGNOSIS — J189 Pneumonia, unspecified organism: Secondary | ICD-10-CM | POA: Insufficient documentation

## 2017-12-18 DIAGNOSIS — D869 Sarcoidosis, unspecified: Secondary | ICD-10-CM | POA: Insufficient documentation

## 2017-12-18 DIAGNOSIS — I340 Nonrheumatic mitral (valve) insufficiency: Secondary | ICD-10-CM | POA: Insufficient documentation

## 2017-12-18 DIAGNOSIS — E785 Hyperlipidemia, unspecified: Secondary | ICD-10-CM | POA: Insufficient documentation

## 2017-12-18 NOTE — Telephone Encounter (Signed)
NOTED

## 2017-12-19 ENCOUNTER — Ambulatory Visit: Payer: Self-pay | Attending: Nurse Practitioner | Admitting: Nurse Practitioner

## 2017-12-19 ENCOUNTER — Encounter: Payer: Self-pay | Admitting: Nurse Practitioner

## 2017-12-19 VITALS — BP 129/83 | HR 73 | Temp 98.3°F | Ht 69.0 in | Wt 154.4 lb

## 2017-12-19 DIAGNOSIS — Z9889 Other specified postprocedural states: Secondary | ICD-10-CM | POA: Insufficient documentation

## 2017-12-19 DIAGNOSIS — F1020 Alcohol dependence, uncomplicated: Secondary | ICD-10-CM | POA: Insufficient documentation

## 2017-12-19 DIAGNOSIS — K219 Gastro-esophageal reflux disease without esophagitis: Secondary | ICD-10-CM | POA: Insufficient documentation

## 2017-12-19 DIAGNOSIS — Z8249 Family history of ischemic heart disease and other diseases of the circulatory system: Secondary | ICD-10-CM | POA: Insufficient documentation

## 2017-12-19 DIAGNOSIS — F1029 Alcohol dependence with unspecified alcohol-induced disorder: Secondary | ICD-10-CM

## 2017-12-19 DIAGNOSIS — Z79899 Other long term (current) drug therapy: Secondary | ICD-10-CM | POA: Insufficient documentation

## 2017-12-19 DIAGNOSIS — F411 Generalized anxiety disorder: Secondary | ICD-10-CM

## 2017-12-19 DIAGNOSIS — Z885 Allergy status to narcotic agent status: Secondary | ICD-10-CM | POA: Insufficient documentation

## 2017-12-19 DIAGNOSIS — M25562 Pain in left knee: Secondary | ICD-10-CM

## 2017-12-19 DIAGNOSIS — Z833 Family history of diabetes mellitus: Secondary | ICD-10-CM | POA: Insufficient documentation

## 2017-12-19 DIAGNOSIS — F325 Major depressive disorder, single episode, in full remission: Secondary | ICD-10-CM

## 2017-12-19 DIAGNOSIS — E785 Hyperlipidemia, unspecified: Secondary | ICD-10-CM | POA: Insufficient documentation

## 2017-12-19 DIAGNOSIS — G8929 Other chronic pain: Secondary | ICD-10-CM

## 2017-12-19 DIAGNOSIS — Z811 Family history of alcohol abuse and dependence: Secondary | ICD-10-CM | POA: Insufficient documentation

## 2017-12-19 MED ORDER — MELOXICAM 15 MG PO TABS: 15.0000 mg | ORAL_TABLET | Freq: Every day | ORAL | 1 refills | Status: DC

## 2017-12-19 NOTE — Progress Notes (Signed)
Assessment & Plan:  [name] [name] was seen today for follow-up.  Diagnoses and all orders for this visit:  Chronic pain of left knee -     Ambulatory referral to Orthopedic Surgery -     DG Knee 1-2 Views Left; Future -     meloxicam (MOBIC) 15 MG tablet; Take 1 tablet (15 mg total) by mouth daily. May alternate mobic with XS tylenol for pain relief Alternate heat and ice application to affected area  Depression, major, in remission ([place]) -     escitalopram (LEXAPRO) 20 MG tablet; Take 2 tablets (40 mg total) by mouth daily. Denies SI/HI  Alcohol dependence with unspecified alcohol-induced disorder ([place]) Denies any ETOH or substance abuse since 07-2017. He is not participating in support groups or [facility] meetings.   Generalized anxiety disorder -     escitalopram (LEXAPRO) 20 MG tablet; Take 2 tablets (40 mg total) by mouth daily. Denies SI/Hi    Patient has been counseled on age-appropriate routine health concerns for screening and prevention. These are reviewed and up-to-date. Referrals have been placed accordingly. Immunizations are up-to-date or declined.    Subjective:   Chief Complaint  Patient presents with  . Follow-up    Patient is here for a follow-up. Patient stated his leftt knee is in pain with a sharp pain that radiates all the way to his hip. Patient stated it hurt with any movement and pressure. Patient stated he tried hot rub but no help.    HPI [name] [name] 57 y.o. male presents to office today for follow up of anxiety and depression. He is endorsing worsening left knee pain today.    Knee Pain Patient complains of left knee pain. This is evaluated as a personal injury. Late 1990s had severe tendon or ligament injury to knee and had an arthroscopic procedure performed.  The pain began several years ago and has worsened over the past few months. The pain is located global.  He describes the symptoms as aching, sharp and shooting. Symptoms improve with rest. The symptoms  are worse with sitting for prolonged periods of time, walking or climbing stairs. The knee has not given out but does feel unstable at times with walking. The patient can bend and straighten the knee fully.  The patient is active in none. Treatment to date has been ice, heat, without significant relief. He endorses sharp pain in the knee that radiates to the left hip.   Anxiety Taking Lexapro 40mg . He was referred to psychiatry at his last appointment. He has concerns of possible ADD/ADHD. He is still waiting for appointment. Anxiey is controlled on Lexapro. He denies SI/HI.  Depression screen [facility] 2/9 12/19/2017  Decreased Interest 0  Down, Depressed, Hopeless 0  PHQ - 2 Score 0  Altered sleeping 0  Tired, decreased energy 0  Change in appetite 0  Feeling bad or failure about yourself  0  Trouble concentrating 0  Moving slowly or fidgety/restless 0  Suicidal thoughts 0  PHQ-9 Score 0  Some recent data might be hidden    Review of Systems  Constitutional: Negative.  Negative for fever, malaise/fatigue and weight loss.  Respiratory: Negative.  Negative for cough, sputum production and shortness of breath.   Cardiovascular: Negative.  Negative for chest pain, orthopnea and leg swelling.  Gastrointestinal: Positive for heartburn.  Musculoskeletal: Positive for joint pain. Negative for back pain, myalgias and neck pain.       SEE HPI  Neurological: Negative.  Negative for dizziness,  focal weakness and headaches.  Psychiatric/Behavioral: Positive for depression. Negative for hallucinations, memory loss, substance abuse and suicidal ideas. The patient is nervous/anxious. The patient does not have insomnia.     Past Medical History:  Diagnosis Date  . Alcohol abuse    Last Drink on Sep 2018  . Anxiety    About 57 years of age  . Carpal tunnel syndrome of right wrist Dx 2015  . Depression    At 57 years of age  . GERD (gastroesophageal reflux disease) Dx 2007  . Hepatitis, alcoholic,  acute 5643  . Hyperlipidemia    borderline, diet controlled  . Pancreatitis Dx 2014  . Pneumonia 11/2014  . 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 Reflux Dx 2007  . 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 carpel tunnel release Right 07/28/2014    done in [place]   . ELBOW SURGERY    . KNEE ARTHROSCOPY    . KNEE SURGERY Left    15 years ago  . LIPOMA EXCISION N/A 05/10/2016   Procedure: EXCISION OF SCALP LIPOMA;  Surgeon: Johnathan Hausen, MD;  Location: [facility];  Service: General;  Laterality: N/A;    Family History  Problem Relation Age of Onset  . Diabetes Father   . Hypertension Father   . Hypertension Paternal Grandfather   . Alcohol abuse Paternal Grandfather   . Alcohol abuse Maternal Grandfather   . Alcohol abuse Maternal Grandmother   . Alcohol abuse Paternal Grandmother   . Colon cancer Neg Hx   . Rectal cancer Neg Hx   . Stomach cancer Neg Hx     Social History Reviewed with no changes to be made today.   Outpatient Medications Prior to Visit  Medication Sig Dispense Refill  . Mometasone Furoate (ASMANEX HFA) 100 MCG/ACT AERO Inhale 2 puffs into the lungs 2 (two) times daily. 1 Inhaler 0  . triamcinolone cream (KENALOG) 0.1 % Apply 1 application topically 2 (two) times daily. 30 g 1  . escitalopram (LEXAPRO) 20 MG tablet Take 20 mg by mouth daily.  5  . albuterol (PROVENTIL HFA;VENTOLIN HFA) 108 (90 Base) MCG/ACT inhaler Inhale 2 puffs into the lungs every 4 (four) hours as needed for wheezing or shortness of breath. (Patient not taking: Reported on 12/19/2017) 1 Inhaler 0  . escitalopram (LEXAPRO) 20 MG tablet Take 2 tablets (40 mg total) by mouth daily. 60 tablet 2  . fluticasone (FLONASE) 50 MCG/ACT nasal spray Place 2 sprays into both nostrils as needed for allergies or rhinitis.    . mupirocin ointment (BACTROBAN) 2 % Apply topically to affected area 2 (two) times per day.  (Patient not taking: Reported on 12/19/2017) 22 g 0   No facility-administered medications prior to visit.     Allergies  Allergen Reactions  . Oxycodone Other (See Comments)    abd pain, stomach cramps        Objective:    BP 129/83 (BP Location: Left Arm, Patient Position: Sitting, Cuff Size: Normal)   Pulse 73   Temp 98.3 F (36.8 C) (Oral)   Ht 5\' 9"  (1.753 m)   Wt 154 lb 6.4 oz (70 kg)   SpO2 97%   BMI 22.80 kg/m  Wt Readings from Last 3 Encounters:  12/19/17 154 lb 6.4 oz (70 kg)  12/05/17 155 lb (70.3 kg)  11/12/17 160 lb 12.8 oz (72.9 kg)    Physical Exam  Constitutional: He is oriented to person, place, and time. He appears well-developed and well-nourished.  HENT:  Head: Normocephalic.  Cardiovascular: Normal rate, regular rhythm and normal heart sounds.  Pulmonary/Chest: Effort normal and breath sounds normal. No respiratory distress. He has no wheezes. He has no rales. He exhibits no tenderness.  Abdominal: Bowel sounds are normal.  Musculoskeletal:       Left hip: He exhibits normal range of motion, normal strength and no tenderness.       Left knee: He exhibits normal range of motion and no swelling.  Pain elicited in left knee with passive flexion of knee and full rotation of left hip  Neurological: He is alert and oriented to person, place, and time.  Skin: Skin is warm and dry.  Psychiatric: He has a normal mood and affect. His speech is normal and behavior is normal. Judgment and thought content normal. Cognition and memory are normal. He expresses no homicidal and no suicidal ideation. He expresses no suicidal plans and no homicidal plans.       Patient has been counseled extensively about nutrition and exercise as well as the importance of adherence with medications and regular follow-up. The patient was given clear instructions to go to ER or return to medical center if symptoms don't improve, worsen or new problems develop. The patient verbalized  understanding.   Follow-up: Return in about 4 weeks (around 01/16/2018) for f/u knee pain.   Gildardo Pounds, FNP-BC [place] [facility] [place], [place]   12/23/2017, 6:49 PM

## 2017-12-19 NOTE — Patient Instructions (Addendum)
How to Use a Knee Brace A knee brace is a device that you wear to support your knee, especially if the knee is healing after an injury or surgery. There are several types of knee braces. Some are designed to prevent an injury (prophylactic brace). These are often worn during sports. Others support an injured knee (functional brace) or keep it still while it heals (rehabilitative brace). People with severe arthritis of the knee may benefit from a brace that takes some pressure off the knee (unloader brace). Most knee braces are made from a combination of cloth and metal or plastic. You may need to wear a knee brace to:  Relieve knee pain.  Help your knee support your weight (improve stability).  Help you walk farther (improve mobility).  Prevent injury.  Support your knee while it heals from surgery or from an injury.  What are the risks? Generally, knee braces are very safe to wear. However, problems may occur, including:  Skin irritation that may lead to infection.  Making your condition worse if you wear the brace in the wrong way.  How to use a knee brace Different braces will have different instructions for use. Your health care provider will tell you or show you:  How to put on your brace.  How to adjust the brace.  When and how often to wear the brace.  How to remove the brace.  If you will need any assistive devices in addition to the brace, such as crutches or a cane.  In general, your brace should:  Have the hinge of the brace line up with the bend of your knee.  Have straps, hooks, or tapes that fasten snugly around your leg.  Not feel too tight or too loose.  How to care for a knee brace  Check your brace often for signs of damage, such as loose connections or attachments. Your knee brace may get damaged or wear out during normal use.  Wash the fabric parts of your brace with soap and water.  Read the insert that comes with your brace for other specific care  instructions. Contact a health care provider if:  Your knee brace is too loose or too tight and you cannot adjust it.  Your knee brace causes skin redness, swelling, bruising, or irritation.  Your knee brace is not helping.  Your knee brace is making your knee pain worse. This information is not intended to replace advice given to you by your health care provider. Make sure you discuss any questions you have with your health care provider. Document Released: 02/03/2004 Document Revised: 04/20/2016 Document Reviewed: 03/08/2015 Elsevier Interactive Patient Education  2018 [place].  Knee Pain, Adult Knee pain in adults is common. It can be caused by many things, including:  Arthritis.  A fluid-filled sac (cyst) or growth in your knee.  An infection in your knee.  An injury that will not heal.  Damage, swelling, or irritation of the tissues that support your knee.  Knee pain is usually not a sign of a serious problem. The pain may go away on its own with time and rest. If it does not, a health care provider may order tests to find the cause of the pain. These may include:  Imaging tests, such as an X-ray, MRI, or ultrasound.  Joint aspiration. In this test, fluid is removed from the knee.  Arthroscopy. In this test, a lighted tube is inserted into knee and an image is projected onto a TV  screen.  A biopsy. In this test, a sample of tissue is removed from the body and studied under a microscope.  Follow these instructions at home: Pay attention to any changes in your symptoms. Take these actions to relieve your pain. Activity  Rest your knee.  Do not do things that cause pain or make pain worse.  Avoid high-impact activities or exercises, such as running, jumping rope, or doing jumping jacks. General instructions  Take over-the-counter and prescription medicines only as told by your health care provider.  Raise (elevate) your knee above the level of your heart when  you are sitting or lying down.  Sleep with a pillow under your knee.  If directed, apply ice to the knee: ? Put ice in a plastic bag. ? Place a towel between your skin and the bag. ? Leave the ice on for 20 minutes, 2-3 times a day.  Ask your health care provider if you should wear an elastic knee support.  Lose weight if you are overweight. Extra weight can put pressure on your knee.  Do not use any products that contain nicotine or tobacco, such as cigarettes and e-cigarettes. Smoking may slow the healing of any bone and joint problems that you may have. If you need help quitting, ask your health care provider. Contact a health care provider if:  Your knee pain continues, changes, or gets worse.  You have a fever along with knee pain.  Your knee buckles or locks up.  Your knee swells, and the swelling becomes worse. Get help right away if:  Your knee feels warm to the touch.  You cannot move your knee.  You have severe pain in your knee.  You have chest pain.  You have trouble breathing. Summary  Knee pain in adults is common. It can be caused by many things, including, arthritis, infection, cysts, or injury.  Knee pain is usually not a sign of a serious problem, but if it does not go away, a health care provider may perform tests to know the cause of the pain.  Pay attention to any changes in your symptoms. Relieve your pain with rest, medicines, light activity, and use of ice.  Get help if your pain continues or becomes very severe, or if your knee buckles or locks up, or if you have chest pain or trouble breathing. This information is not intended to replace advice given to you by your health care provider. Make sure you discuss any questions you have with your health care provider. Document Released: 09/10/2007 Document Revised: 11/03/2016 Document Reviewed: 11/03/2016 Elsevier Interactive Patient Education  [employer].

## 2017-12-21 ENCOUNTER — Telehealth: Payer: Self-pay | Admitting: Internal Medicine

## 2017-12-21 NOTE — Telephone Encounter (Signed)
Let [name] [name] know that PFT normal but for reduction in o2 transfer capacity which is to be expected but is not bad. Heart echo fine but fo rmild stiffness I heart muscule. Will d/w him at followup. He should see dermatologist for sure  Dr. Brand Males, M.D., [facility].C.P Pulmonary and Critical Care Medicine Staff Physician, [place] Director - Interstitial Lung Disease  Program  Pulmonary [place] at [place], [place], [address]  Pager: [phone], If no answer or between  15:00h - 7:00h: call [phone] Telephone: [phone]

## 2017-12-23 ENCOUNTER — Encounter: Payer: Self-pay | Admitting: Nurse Practitioner

## 2017-12-23 MED ORDER — ESCITALOPRAM OXALATE 20 MG PO TABS: 40.0000 mg | ORAL_TABLET | Freq: Every day | ORAL | 5 refills | Status: DC

## 2017-12-26 NOTE — Telephone Encounter (Signed)
Patient returning call, [phone]. He states that he is not allowed to have his phone on while at work and asking if we can leave a detailed msg on his VM as he is the only one with access to this.

## 2017-12-26 NOTE — Telephone Encounter (Signed)
Left detailed message for patient. Will close this encounter.

## 2017-12-27 ENCOUNTER — Ambulatory Visit (HOSPITAL_COMMUNITY): Payer: Self-pay | Admitting: Psychiatry

## 2017-12-27 ENCOUNTER — Ambulatory Visit (INDEPENDENT_AMBULATORY_CARE_PROVIDER_SITE_OTHER): Payer: No Typology Code available for payment source | Admitting: Psychiatry

## 2017-12-27 ENCOUNTER — Encounter (HOSPITAL_COMMUNITY): Payer: Self-pay | Admitting: Psychiatry

## 2017-12-27 VITALS — BP 141/89 | HR 72 | Ht 69.0 in | Wt 146.9 lb

## 2017-12-27 DIAGNOSIS — F411 Generalized anxiety disorder: Secondary | ICD-10-CM

## 2017-12-27 DIAGNOSIS — M2550 Pain in unspecified joint: Secondary | ICD-10-CM

## 2017-12-27 DIAGNOSIS — F1021 Alcohol dependence, in remission: Secondary | ICD-10-CM

## 2017-12-27 DIAGNOSIS — Z811 Family history of alcohol abuse and dependence: Secondary | ICD-10-CM

## 2017-12-27 MED ORDER — VENLAFAXINE HCL ER 75 MG PO CP24: 75.0000 mg | ORAL_CAPSULE | Freq: Every day | ORAL | 1 refills | Status: DC

## 2017-12-27 MED ORDER — GABAPENTIN 100 MG PO CAPS: 100.0000 mg | ORAL_CAPSULE | Freq: Three times a day (TID) | ORAL | 1 refills | Status: DC

## 2017-12-27 NOTE — Progress Notes (Signed)
Psychiatric Initial Adult Assessment   Patient Identification: [name] [name] MRN:  [id number] Date of Evaluation:  12/27/2017 Referral Source: pcp- Dr. Raul Del Chief Complaint:   Chief Complaint    Establish Care     Visit Diagnosis:    ICD-10-CM   1. GAD (generalized anxiety disorder) F41.1 venlafaxine XR (EFFEXOR XR) 75 MG 24 hr capsule    gabapentin (NEURONTIN) 100 MG capsule  2. Alcohol use disorder, severe, in early remission ([place]) F10.21     HPI:  Patient is here for evaluation of anxiety.  He has the following anxiety symptoms: chest pain, difficulty concentrating, fatigue, insomnia, irritable, palpitations, racing thoughts, shortness of breath. Lately his anxiety seems worse. He has racing thoughts and is easily distracted. He has an inner restless that won't calm. Anxiety makes him tired.  He states he does not like being in crowds where he can't make quick exit. He was experiencing panic attacks but over the last year it has improved. Onset of symptoms was approximately in his early childhood.  Symptoms have been unchanged since that time and he often self medicated with alcohol. He denies current suicidal and homicidal ideation. Family history significant for substance abuse.Possible organic causes contributing are: none. Risk factors: none Previous treatment includes medication BuSpar, Lexapro, Paxil and Zoloft.   He complains of the following medication side effects: none. He states he never gave the meds a chance because he was drinking alcohol. Pt here at the recommendation of his PCP. They have working on controlling his anxiety. He feels his anxiety is still not well controlled even with Lexapro 40mg . He thinks his alcohol issues were a way of self medicating his anxiety.    Associated Signs/Symptoms: Depression Symptoms:  denies depression, sadness, hopelessness, anehdonia, worthlessness. he denies SI/HI (Hypo) Manic Symptoms:  negative Anxiety Symptoms:  he denies OCD  symptoms, social anxiety and phobias Psychotic Symptoms:  negative PTSD Symptoms: Negative  Past Psychiatric History:  Dx:  GAD; Panic disorder; Alcohol use disorder all dx by PCP -learning disability- was in special ed classes in school- anxious in school and used to stutter. At the age of 20 he drank his first beer. It made him feel more relaxed.  Meds: Buspar, Paxil Previous psychiatrist/therapist: [facility] x1 visit, otherwise tried to manage with PCP Hospitalizations: detox several times SIB: denies Suicide attempts: denies Hx of violent behavior towards others: denies Current access to guns: denies Hx of abuse:  denies Military Hx: denies Hx of Seizures: denies Hx of TBI: denies  Substance Abuse History in the last 12 months:  Yes.    Consequences of Substance Abuse: Medical Consequences:  alcohol related hepatitis in 2015 Withdrawal Symptoms:   Diarrhea Headaches Nausea Tremors Vomiting Detox 4 days in [place], [place] rehab 60 days in Sept 2018  He has gone thru detox many times in last several years. When he was drinking 5-6 beers on a weekday and then much more on weekends.   -His last drink was September 2018. The longest period he has stayed sober was one year in 2015. Triggers for use include finances, homelessness and unemployment and anxiety.   Past Medical History:  Past Medical History:  Diagnosis Date  . Alcohol abuse    Last Drink on Sep 2018  . Anxiety    About 57 years of age  . Carpal tunnel syndrome of right wrist Dx 2015  . Depression    At 57 years of age  . GERD (gastroesophageal reflux disease) Dx 2007  . Hepatitis,  alcoholic, acute 1610  . Hyperlipidemia    borderline, diet controlled  . Pancreatitis Dx 2014  . Pneumonia 11/2014  . 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 Reflux Dx 2007  . 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 carpel tunnel release Right 07/28/2014     done in [place]   . ELBOW SURGERY    . KNEE ARTHROSCOPY    . KNEE SURGERY Left    15 years ago  . LIPOMA EXCISION N/A 05/10/2016   Procedure: EXCISION OF SCALP LIPOMA;  Surgeon: Johnathan Hausen, MD;  Location: [facility];  Service: General;  Laterality: N/A;    Family Psychiatric History:  Family History  Problem Relation Age of Onset  . Diabetes Father   . Hypertension Father   . Hypertension Paternal Grandfather   . Alcohol abuse Paternal Grandfather   . Alcohol abuse Maternal Grandfather   . Alcohol abuse Maternal Grandmother   . Alcohol abuse Paternal Grandmother   . Colon cancer Neg Hx   . Rectal cancer Neg Hx   . Stomach cancer Neg Hx   . Bipolar disorder Neg Hx   . Depression Neg Hx     Social History:   Social History   Socioeconomic History  . Marital status: Legally Separated    Spouse name: None  . Number of children: 2  . Years of education: 13 yrs  . Highest education level: None  Social Needs  . Financial resource strain: None  . Food insecurity - worry: None  . Food insecurity - inability: None  . Transportation needs - medical: None  . Transportation needs - non-medical: None  Occupational History  . Occupation: custodian    Comment: [employer]  Tobacco Use  . Smoking status: Never Smoker  . Smokeless tobacco: Never Used  Substance and Sexual Activity  . Alcohol use: No    Alcohol/week: 0.0 oz    Frequency: Never    Comment: last drink Sept 2018  . Drug use: No    Comment: Patient denies   . Sexual activity: None  Other Topics Concern  . None  Social History Narrative  . None    Allergies:   Allergies  Allergen Reactions  . Oxycodone Other (See Comments)    abd pain, stomach cramps     Metabolic Disorder Labs: Lab Results  Component Value Date   HGBA1C 6.0 10/22/2017   MPG 120 06/30/2017   No results found for: PROLACTIN Lab Results  Component Value Date   CHOL 330 (H) 06/30/2017   TRIG 61 06/30/2017    HDL 196 06/30/2017   CHOLHDL 1.7 06/30/2017   VLDL 12 06/30/2017   LDLCALC 122 (H) 06/30/2017   LDLCALC 128 (H) 12/15/2014     Current Medications: Current Outpatient Medications  Medication Sig Dispense Refill  . albuterol (PROVENTIL HFA;VENTOLIN HFA) 108 (90 Base) MCG/ACT inhaler Inhale 2 puffs into the lungs every 4 (four) hours as needed for wheezing or shortness of breath. 1 Inhaler 0  . escitalopram (LEXAPRO) 20 MG tablet Take 2 tablets (40 mg total) by mouth daily. 60 tablet 5  . escitalopram (LEXAPRO) 20 MG tablet Take 2 tablets (40 mg total) by mouth daily. 60 tablet 2  . fluticasone (FLONASE) 50 MCG/ACT nasal spray Place 2 sprays into both nostrils as needed for allergies or rhinitis.    . meloxicam (MOBIC) 15 MG tablet Take 1 tablet (15 mg total) by  mouth daily. (Patient not taking: Reported on 12/27/2017) 30 tablet 1  . Mometasone Furoate (ASMANEX HFA) 100 MCG/ACT AERO Inhale 2 puffs into the lungs 2 (two) times daily. (Patient not taking: Reported on 12/27/2017) 1 Inhaler 0  . mupirocin ointment (BACTROBAN) 2 % Apply topically to affected area 2 (two) times per day. (Patient not taking: Reported on 12/19/2017) 22 g 0  . triamcinolone cream (KENALOG) 0.1 % Apply 1 application topically 2 (two) times daily. (Patient not taking: Reported on 12/27/2017) 30 g 1   No current facility-administered medications for this visit.     Neurologic: Headache: No Seizure: No Paresthesias:No  Musculoskeletal: Strength & Muscle Tone: within normal limits Gait & Station: normal Patient leans: N/A  Psychiatric Specialty Exam: Review of Systems  Musculoskeletal: Positive for joint pain. Negative for back pain and neck pain.  Neurological: Negative for dizziness, tremors, seizures and headaches.    Blood pressure (!) 141/89, pulse 72, height 5\' 9"  (1.753 m), weight 146 lb 14.4 oz (66.6 kg).Body mass index is 21.69 kg/m.  General Appearance: Fairly Groomed  Eye Contact:  Good  Speech:   Clear and Coherent and Normal Rate  Volume:  Normal  Mood:  Anxious  Affect:  Congruent  Thought Process:  Goal Directed and Descriptions of Associations: Intact  Orientation:  Full (Time, Place, and Person)  Thought Content:  Logical  Suicidal Thoughts:  No  Homicidal Thoughts:  No  Memory:  Immediate;   Good Recent;   Good Remote;   Good  Judgement:  Intact  Insight:  Present  Psychomotor Activity:  Normal  Concentration:  Concentration: Good and Attention Span: Good  Recall:  Good  Fund of Knowledge:Good  Language: Good  Akathisia:  No  Handed:  Right  AIMS (if indicated):  n/a  Assets:  Communication Skills Desire for Improvement Financial Resources/Insurance Housing Transportation Vocational/Educational  ADL's:  Intact  Cognition: WNL  Sleep:  poor    Treatment Plan Summary: Medication management   Assessment and Plan: GAD; Panic disorder by hx; Alcohol use disorder- in early remission   Medication management with supportive therapy. Risks/benefits and SE of the medication discussed. Pt verbalized understanding and verbal consent obtained for treatment.  Affirm with the patient that the medications are taken as ordered. Patient expressed understanding of how their medications were to be used.   Meds: d/c Lexapro Start trial of Effexor XR 75mg  po qD for GAD Start trial of Neurontin 100mg  po TID for off label treatment of GAD   Labs: none  Therapy: brief supportive therapy provided. Discussed psychosocial stressors in detail.   Recommended pt stop all drug and alcohol use  Consultations: Encouraged to follow up with SA therapist Encouraged to follow up with PCP as needed  Pt denies SI and is at an acute low risk for suicide. Patient told to call clinic if any problems occur. Patient advised to go to ER if they should develop SI/HI, side effects, or if symptoms worsen. Has crisis numbers to call if needed. Pt verbalized understanding.  F/up in 6 weeks or sooner  if needed    Charlcie Cradle, MD 1/31/20195:05 PM

## 2017-12-28 MED FILL — VENLAFAXINE HCL ER 75 MG CA: 75 | 30 days supply | Qty: 30 | Fill #0

## 2017-12-28 MED FILL — GABAPENTIN 100 MG CAPSULE: 100 | 30 days supply | Qty: 90 | Fill #0

## 2017-12-31 ENCOUNTER — Ambulatory Visit (INDEPENDENT_AMBULATORY_CARE_PROVIDER_SITE_OTHER): Payer: Self-pay | Admitting: Orthopaedic Surgery

## 2018-01-03 ENCOUNTER — Encounter (HOSPITAL_COMMUNITY): Payer: Self-pay | Admitting: Psychiatry

## 2018-01-04 ENCOUNTER — Ambulatory Visit: Payer: Self-pay

## 2018-01-05 ENCOUNTER — Emergency Department (HOSPITAL_COMMUNITY)
Admission: EM | Admit: 2018-01-05 | Discharge: 2018-01-05 | Disposition: A | Discharge status: Home / Self Care | Payer: No Typology Code available for payment source | Attending: Emergency Medicine | Admitting: Emergency Medicine

## 2018-01-05 ENCOUNTER — Other Ambulatory Visit: Payer: Self-pay

## 2018-01-05 ENCOUNTER — Encounter (HOSPITAL_COMMUNITY): Payer: Self-pay | Admitting: *Deleted

## 2018-01-05 DIAGNOSIS — K2920 Alcoholic gastritis without bleeding: Secondary | ICD-10-CM | POA: Insufficient documentation

## 2018-01-05 DIAGNOSIS — Z79899 Other long term (current) drug therapy: Secondary | ICD-10-CM | POA: Insufficient documentation

## 2018-01-05 LAB — URINALYSIS, ROUTINE W REFLEX MICROSCOPIC
Bilirubin Urine: NEGATIVE
Glucose, UA: NEGATIVE mg/dL
Hgb urine dipstick: NEGATIVE
Ketones, ur: NEGATIVE mg/dL
Leukocytes, UA: NEGATIVE
Nitrite: NEGATIVE
Protein, ur: NEGATIVE mg/dL
Specific Gravity, Urine: 1.009 (ref 1.005–1.030)
pH: 5 (ref 5.0–8.0)

## 2018-01-05 LAB — CBC
HCT: 42.2 % (ref 39.0–52.0)
Hemoglobin: 13.4 g/dL (ref 13.0–17.0)
MCH: 25.9 pg — ABNORMAL LOW (ref 26.0–34.0)
MCHC: 31.8 g/dL (ref 30.0–36.0)
MCV: 81.5 fL (ref 78.0–100.0)
Platelets: 231 10*3/uL (ref 150–400)
RBC: 5.18 MIL/uL (ref 4.22–5.81)
RDW: 16.2 % — ABNORMAL HIGH (ref 11.5–15.5)
WBC: 5 10*3/uL (ref 4.0–10.5)

## 2018-01-05 LAB — LIPASE, BLOOD: Lipase: 57 U/L — ABNORMAL HIGH (ref 11–51)

## 2018-01-05 LAB — COMPREHENSIVE METABOLIC PANEL
ALT: 18 U/L (ref 17–63)
AST: 27 U/L (ref 15–41)
Albumin: 4.3 g/dL (ref 3.5–5.0)
Alkaline Phosphatase: 39 U/L (ref 38–126)
Anion gap: 11 (ref 5–15)
BUN: 9 mg/dL (ref 6–20)
CO2: 22 mmol/L (ref 22–32)
Calcium: 9 mg/dL (ref 8.9–10.3)
Chloride: 103 mmol/L (ref 101–111)
Creatinine, Ser: 0.93 mg/dL (ref 0.61–1.24)
GFR calc Af Amer: >60 mL/min (ref >60)
GFR calc non Af Amer: >60 mL/min (ref >60)
Glucose, Bld: 94 mg/dL (ref 65–99)
Potassium: 4.1 mmol/L (ref 3.5–5.1)
Sodium: 136 mmol/L (ref 135–145)
Total Bilirubin: 0.8 mg/dL (ref 0.3–1.2)
Total Protein: 8.3 g/dL — ABNORMAL HIGH (ref 6.5–8.1)

## 2018-01-05 MED ORDER — ONDANSETRON HCL 4 MG PO TABS: 4.0000 mg | ORAL_TABLET | Freq: Three times a day (TID) | ORAL | 0 refills | Status: DC | PRN

## 2018-01-05 MED ORDER — OMEPRAZOLE 20 MG PO CPDR: 20.0000 mg | DELAYED_RELEASE_CAPSULE | Freq: Every day | ORAL | 0 refills | Status: DC

## 2018-01-05 MED ORDER — ONDANSETRON 4 MG PO TBDP
4.0000 mg | ORAL_TABLET | Freq: Once | ORAL | Status: AC
  Administered 2018-01-05: 4 mg via ORAL
  Filled 2018-01-05: qty 1

## 2018-01-05 MED ORDER — SUCRALFATE 1 GM/10ML PO SUSP: 1.0000 g | Freq: Three times a day (TID) | ORAL | 0 refills | Status: DC

## 2018-01-05 MED ORDER — GI COCKTAIL ~~LOC~~
30.0000 mL | Freq: Once | ORAL | Status: AC
  Administered 2018-01-05: 30 mL via ORAL
  Filled 2018-01-05: qty 30

## 2018-01-05 NOTE — ED Provider Notes (Signed)
[facility] EMERGENCY DEPARTMENT Provider Note   CSN: [id number] Arrival date & time: 01/05/18  1740     History   Chief Complaint Chief Complaint  Patient presents with  . Abdominal Pain    HPI [name] [name] is a 57 y.o. male.  The history is provided by the patient. No language interpreter was used.  Abdominal Pain     [name] [name] is a 57 y.o. male who presents to the Emergency Department complaining of abdominal pain.  He presents for evaluation of epigastric abdominal pain and nausea.  He started drinking again about a month ago and drinks about 3 beers daily.  Over the last month since he began drinking he has developed nausea with dry heaves and occasional vomiting in the mornings.  Over the last week he is experienced some epigastric discomfort and pain.  The pain is present in the mornings but does improve throughout the day when he is able to drink fluids.  He endorses associated poor appetite with weight loss.  No fevers, diarrhea, black or bloody stools.  He saw a psychiatrist only and is planning to start gabapentin and Effexor tomorrow.  Symptoms are moderate in nature. Past Medical History:  Diagnosis Date  . Alcohol abuse    Last Drink on Sep 2018  . Anxiety    About 57 years of age  . Carpal tunnel syndrome of right wrist Dx 2015  . Depression    At 57 years of age  . GERD (gastroesophageal reflux disease) Dx 2007  . Hepatitis, alcoholic, acute 5188  . Hyperlipidemia    borderline, diet controlled  . Pancreatitis Dx 2014  . Pneumonia 11/2014  . 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 Reflux Dx 2007  . Sarcoidosis of lung [facility]) Dx 1989    Patient Active Problem List   Diagnosis Date Noted  . 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 Lateral epicondylitis of right elbow 01/05/2017  . Strain, MCP, hand, right, initial encounter 01/05/2017  . 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 Bilateral knee pain 01/11/2016  . Chronic pain  of right wrist 07/07/2015  . Carpal tunnel syndrome of right wrist 05/20/2015  . Dislocation of right shoulder joint 05/20/2015  . Tear of medial meniscus of right knee 03/24/2015  . 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 Chronic cough 01/15/2015  . Onychomycosis of toenail 12/15/2014  . Seborrhea 11/11/2014  . GERD (gastroesophageal reflux disease)   . Alcohol abuse 01/19/2014  . Alcohol dependence with unspecified alcohol-induced disorder ([place]) 09/02/2013  . Generalized anxiety disorder 09/02/2013    Past Surgical History:  Procedure Laterality Date  . carpel tunnel release Right 07/28/2014    done in [place]   . ELBOW SURGERY    . KNEE ARTHROSCOPY    . KNEE SURGERY Left    15 years ago  . LIPOMA EXCISION N/A 05/10/2016   Procedure: EXCISION OF SCALP LIPOMA;  Surgeon: Johnathan Hausen, MD;  Location: [facility];  Service: General;  Laterality: N/A;       Home Medications    Prior to Admission medications   Medication Sig Start Date End Date Taking? Authorizing Provider  gabapentin (NEURONTIN) 100 MG capsule Take 1 capsule (100 mg total) by mouth 3 (three) times daily. 12/27/17 12/27/18 Yes Charlcie Cradle, MD  venlafaxine XR (EFFEXOR XR) 75 MG 24 hr capsule Take 1 capsule (75 mg total) by mouth daily. 12/27/17 12/27/18 Yes Charlcie Cradle, MD  albuterol (PROVENTIL HFA;VENTOLIN HFA) 108 (90  Base) MCG/ACT inhaler Inhale 2 puffs into the lungs every 4 (four) hours as needed for wheezing or shortness of breath. Patient not taking: Reported on 01/05/2018 03/01/17   Janne Napoleon, NP  meloxicam (MOBIC) 15 MG tablet Take 1 tablet (15 mg total) by mouth daily. Patient not taking: Reported on 12/27/2017 12/19/17   Gildardo Pounds, NP  Mometasone Furoate [facility] HFA) 100 MCG/ACT AERO Inhale 2 puffs into the lungs 2 (two) times daily. Patient not taking: Reported on 12/27/2017 12/05/17   Brand Males, MD  mupirocin ointment (BACTROBAN) 2 % Apply topically to affected area 2  (two) times per day. Patient not taking: Reported on 12/19/2017 12/12/17   Gildardo Pounds, NP  omeprazole (PRILOSEC) 20 MG capsule Take 1 capsule (20 mg total) by mouth daily. 01/05/18   Quintella Reichert, MD  ondansetron (ZOFRAN) 4 MG tablet Take 1 tablet (4 mg total) by mouth every 8 (eight) hours as needed for nausea or vomiting. 01/05/18   Quintella Reichert, MD  sucralfate (CARAFATE) 1 GM/10ML suspension Take 10 mLs (1 g total) by mouth 4 (four) times daily -  with meals and at bedtime. 01/05/18   Quintella Reichert, MD  triamcinolone cream (KENALOG) 0.1 % Apply 1 application topically 2 (two) times daily. Patient not taking: Reported on 12/27/2017 10/22/17   Gildardo Pounds, NP    Family History Family History  Problem Relation Age of Onset  . Diabetes Father   . Hypertension Father   . Hypertension Paternal Grandfather   . Alcohol abuse Paternal Grandfather   . Alcohol abuse Maternal Grandfather   . Alcohol abuse Maternal Grandmother   . Alcohol abuse Paternal Grandmother   . Colon cancer Neg Hx   . Rectal cancer Neg Hx   . Stomach cancer Neg Hx   . Bipolar disorder Neg Hx   . Depression Neg Hx     Social History Social History   Tobacco Use  . Smoking status: Never Smoker  . Smokeless tobacco: Never Used  Substance Use Topics  . Alcohol use: No    Alcohol/week: 0.0 oz    Frequency: Never    Comment: last drink Sept 2018  . Drug use: No    Comment: Patient denies      Allergies   Oxycodone   Review of Systems Review of Systems  Gastrointestinal: Positive for abdominal pain.  All other systems reviewed and are negative.    Physical Exam Updated Vital Signs BP 101/78   Pulse 74   Temp 98.5 F (36.9 C) (Oral)   Resp 17   Ht 5\' 9"  (1.753 m)   Wt 69.9 kg (154 lb)   SpO2 98%   BMI 22.74 kg/m   Physical Exam  Constitutional: He is oriented to person, place, and time. He appears well-developed and well-nourished.  HENT:  Head: Normocephalic and atraumatic.    Cardiovascular: Normal rate and regular rhythm.  No murmur heard. Pulmonary/Chest: Effort normal and breath sounds normal. No respiratory distress.  Abdominal: Soft. There is no rebound and no guarding.  Mild epigastric tenderness  Musculoskeletal: He exhibits no edema or tenderness.  Neurological: He is alert and oriented to person, place, and time.  Skin: Skin is warm and dry.  Psychiatric: He has a normal mood and affect. His behavior is normal.  Nursing note and vitals reviewed.    ED Treatments / Results  Labs (all labs ordered are listed, but only abnormal results are displayed) Labs Reviewed  LIPASE, BLOOD - Abnormal; Notable  for the following components:      Result Value   Lipase 57 (*)    All other components within normal limits  COMPREHENSIVE METABOLIC PANEL - Abnormal; Notable for the following components:   Total Protein 8.3 (*)    All other components within normal limits  CBC - Abnormal; Notable for the following components:   MCH 25.9 (*)    RDW 16.2 (*)    All other components within normal limits  URINALYSIS, ROUTINE W REFLEX MICROSCOPIC - Abnormal; Notable for the following components:   Color, Urine STRAW (*)    All other components within normal limits    EKG  EKG Interpretation None       Radiology No results found.  Procedures Procedures (including critical care time)  Medications Ordered in ED Medications  ondansetron (ZOFRAN-ODT) disintegrating tablet 4 mg (4 mg Oral Given 01/05/18 2224)  gi cocktail (Maalox,Lidocaine,Donnatal) (30 mLs Oral Given 01/05/18 2225)     Initial Impression / Assessment and Plan / ED Course  I have reviewed the triage vital signs and the nursing notes.  Pertinent labs & imaging results that were available during my care of the patient were reviewed by me and considered in my medical decision making (see chart for details).     Patient with history of alcohol abuse here for evaluation of epigastric pain and  nausea that is been progressive over the last several weeks.  He has minimal tenderness on examination.  Following treatment with GI cocktail and Zofran in the department he is feeling improved.  He is not having any vomiting.  Presentation is not consistent with perforated viscus, acute pancreatitis, bowel obstruction.  Discussed with patient home care for gastritis.  Discussed decreasing his alcohol intake, outpatient follow-up and return precautions.  Final Clinical Impressions(s) / ED Diagnoses   Final diagnoses:  Acute alcoholic gastritis without hemorrhage    ED Discharge Orders        Ordered    omeprazole (PRILOSEC) 20 MG capsule  Daily     01/05/18 2327    ondansetron (ZOFRAN) 4 MG tablet  Every 8 hours PRN     01/05/18 2327    sucralfate (CARAFATE) 1 GM/10ML suspension  3 times daily with meals & bedtime     01/05/18 2327       Quintella Reichert, MD 01/06/18 [phone]

## 2018-01-05 NOTE — ED Triage Notes (Signed)
The pt is c/o abd pain with n v for 3-4 days he is a alcoholic and he has been drinking alcohol  Hx pancreatitis

## 2018-01-07 ENCOUNTER — Ambulatory Visit (INDEPENDENT_AMBULATORY_CARE_PROVIDER_SITE_OTHER): Payer: Self-pay | Admitting: Orthopaedic Surgery

## 2018-01-07 MED FILL — ?OMEPRAZOLE 20 MG CPDR: 20 | 30 days supply | Qty: 30 | Fill #0

## 2018-01-07 MED FILL — ?ONDANSETRON HCL 4MG TABLET: 4 | 3 days supply | Qty: 11 | Fill #0

## 2018-01-07 MED FILL — CARAFATE 1 GM/10 ML SUSP: 1 | 10 days supply | Qty: 420 | Fill #0

## 2018-02-05 ENCOUNTER — Telehealth: Payer: Self-pay

## 2018-02-05 MED FILL — VENLAFAXINE HCL ER 75 MG CA: 75 | 30 days supply | Qty: 30 | Fill #1

## 2018-02-05 NOTE — Telephone Encounter (Signed)
Pharmacy request to refill Omeprazole  20 mg Quantity: 30 Refills:1  Okay to refill?

## 2018-02-06 ENCOUNTER — Other Ambulatory Visit: Payer: Self-pay | Admitting: Nurse Practitioner

## 2018-02-06 MED ORDER — OMEPRAZOLE 20 MG PO CPDR: 20.0000 mg | DELAYED_RELEASE_CAPSULE | Freq: Every day | ORAL | 1 refills | Status: DC

## 2018-02-06 NOTE — Telephone Encounter (Signed)
Called patient to inform Rx has been sent. No answer and left a VM for patient to call back.

## 2018-02-06 NOTE — Telephone Encounter (Signed)
Approved for refill.

## 2018-02-06 NOTE — Telephone Encounter (Signed)
RX been sent.

## 2018-02-06 NOTE — Telephone Encounter (Signed)
Noted  

## 2018-02-07 ENCOUNTER — Ambulatory Visit (INDEPENDENT_AMBULATORY_CARE_PROVIDER_SITE_OTHER): Payer: No Typology Code available for payment source | Admitting: Psychiatry

## 2018-02-07 ENCOUNTER — Encounter (HOSPITAL_COMMUNITY): Payer: Self-pay | Admitting: Psychiatry

## 2018-02-07 VITALS — BP 136/82 | HR 109 | Ht 69.0 in | Wt 147.0 lb

## 2018-02-07 DIAGNOSIS — R112 Nausea with vomiting, unspecified: Secondary | ICD-10-CM

## 2018-02-07 DIAGNOSIS — F411 Generalized anxiety disorder: Secondary | ICD-10-CM

## 2018-02-07 DIAGNOSIS — Z811 Family history of alcohol abuse and dependence: Secondary | ICD-10-CM

## 2018-02-07 DIAGNOSIS — R109 Unspecified abdominal pain: Secondary | ICD-10-CM

## 2018-02-07 DIAGNOSIS — F1020 Alcohol dependence, uncomplicated: Secondary | ICD-10-CM

## 2018-02-07 DIAGNOSIS — F410 Panic disorder [episodic paroxysmal anxiety] without agoraphobia: Secondary | ICD-10-CM

## 2018-02-07 MED ORDER — DISULFIRAM 250 MG PO TABS: 250.0000 mg | ORAL_TABLET | Freq: Two times a day (BID) | ORAL | 1 refills | Status: DC

## 2018-02-07 MED ORDER — GABAPENTIN 100 MG PO CAPS: 100.0000 mg | ORAL_CAPSULE | Freq: Three times a day (TID) | ORAL | 1 refills | Status: DC

## 2018-02-07 MED ORDER — VENLAFAXINE HCL ER 75 MG PO CP24: 75.0000 mg | ORAL_CAPSULE | Freq: Every day | ORAL | 1 refills | Status: DC

## 2018-02-07 NOTE — [place] Notes (Signed)
[facility] MD/PA/NP OP [place] Note  02/07/2018 4:43 PM [name] [name]  MRN:  [id number]  Chief Complaint:  Chief Complaint    Alcohol Problem; Anxiety     HPI: Pt reports he slipped up due to stress and depression and anxiety for that one day.pt has been taking Effexor and Neurontin but didn't feel they are working.  He drank from Friday to Sunday. He later admitted he has been drinking everyday since Friday. He tried to go to work on Monday and end up having a bad panic attack and had to leave work. He has not yet returned to work due to extreme anxiety and "panic mode". He thinks he was having withdrawal symptoms on Monday. Pt is feeling "cloudy". He plans to work tomorrow and will be off the weekend. He wants help now before the drinking gets out of hand. He wants to get his "clarity" back. Pt admits he is feeling guilty and feels the need to cut back. He knows that alcohol is not helping but can't seem to stop. He thinks the meds should kick in now that it has been a month. [name] [name] denies SI/HI.  Visit Diagnosis:    ICD-10-CM   1. Alcohol use disorder, moderate, dependence (HCC) F10.20 disulfiram (ANTABUSE) 250 MG tablet  2. GAD (generalized anxiety disorder) F41.1 gabapentin (NEURONTIN) 100 MG capsule    venlafaxine XR (EFFEXOR XR) 75 MG 24 hr capsule      Past Psychiatric History:  Dx:  GAD; Panic disorder; Alcohol use disorder all dx by PCP -learning disability- was in special ed classes in school- anxious in school and used to stutter. At the age of 57 he drank his first beer. It made him feel more relaxed.  Meds: Buspar, Paxil Previous psychiatrist/therapist: [facility] x1 visit, otherwise tried to manage with PCP Hospitalizations: detox several times SIB: denies Suicide attempts: denies Hx of violent behavior towards others: denies Current access to guns: denies Hx of abuse:  denies Military Hx: denies Hx of Seizures: denies Hx of TBI: denies    Past Medical History:  Past Medical  History:  Diagnosis Date  . Alcohol abuse    Last Drink on Sep 2018  . Anxiety    About 57 years of age  . Carpal tunnel syndrome of right wrist Dx 2015  . Depression    At 57 years of age  . GERD (gastroesophageal reflux disease) Dx 2007  . Hepatitis, alcoholic, acute 7035  . Hyperlipidemia    borderline, diet controlled  . Pancreatitis Dx 2014  . Pneumonia 11/2014  . 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 Reflux Dx 2007  . 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 carpel tunnel release Right 07/28/2014    done in [place]   . ELBOW SURGERY    . KNEE ARTHROSCOPY    . KNEE SURGERY Left    15 years ago  . LIPOMA EXCISION N/A 05/10/2016   Procedure: EXCISION OF SCALP LIPOMA;  Surgeon: Johnathan Hausen, MD;  Location: [facility];  Service: General;  Laterality: N/A;    Family Psychiatric History:  Family History  Problem Relation Age of Onset  . Diabetes Father   . Hypertension Father   . Hypertension Paternal Grandfather   . Alcohol abuse Paternal Grandfather   . Alcohol abuse Maternal Grandfather   . Alcohol abuse Maternal Grandmother   . Alcohol abuse Paternal Grandmother   . Colon cancer Neg Hx   .  Rectal cancer Neg Hx   . Stomach cancer Neg Hx   . Bipolar disorder Neg Hx   . Depression Neg Hx     Social History:  Social History   Socioeconomic History  . Marital status: Legally Separated    Spouse name: None  . Number of children: 2  . Years of education: 13 yrs  . Highest education level: None  Social Needs  . Financial resource strain: None  . Food insecurity - worry: None  . Food insecurity - inability: None  . Transportation needs - medical: None  . Transportation needs - non-medical: None  Occupational History  . Occupation: custodian    Comment: [employer]  Tobacco Use  . Smoking status: Never Smoker  . Smokeless tobacco: Never Used  Substance and Sexual  Activity  . Alcohol use: Yes    Alcohol/week: 0.0 oz    Frequency: Never  . Drug use: No    Comment: Patient denies   . Sexual activity: None  Other Topics Concern  . None  Social History Narrative   Born and raised in [place], [place] by both parents. He has 2 sisters and is the middle child. He remains close with family. He was married for 16 yrs and seperated in 2005. He has 2 boys who are in their 20's now. He lives alone in [place]. He is working as a Retail buyer    Allergies:  Allergies  Allergen Reactions  . Oxycodone Other (See Comments)    abd pain, stomach cramps     Metabolic Disorder Labs: Lab Results  Component Value Date   HGBA1C 6.0 10/22/2017   MPG 120 06/30/2017   No results found for: PROLACTIN Lab Results  Component Value Date   CHOL 330 (H) 06/30/2017   TRIG 61 06/30/2017   HDL 196 06/30/2017   CHOLHDL 1.7 06/30/2017   VLDL 12 06/30/2017   LDLCALC 122 (H) 06/30/2017   LDLCALC 128 (H) 12/15/2014   Lab Results  Component Value Date   TSH 0.985 06/30/2017   TSH 0.89 01/04/2017    Therapeutic Level Labs: No results found for: LITHIUM No results found for: VALPROATE No components found for:  CBMZ  Current Medications: Current Outpatient Medications  Medication Sig Dispense Refill  . gabapentin (NEURONTIN) 100 MG capsule Take 1 capsule (100 mg total) by mouth 3 (three) times daily. 90 capsule 1  . omeprazole (PRILOSEC) 20 MG capsule Take 1 capsule (20 mg total) by mouth daily. 90 capsule 1  . ondansetron (ZOFRAN) 4 MG tablet Take 1 tablet (4 mg total) by mouth every 8 (eight) hours as needed for nausea or vomiting. 12 tablet 0  . venlafaxine XR (EFFEXOR XR) 75 MG 24 hr capsule Take 1 capsule (75 mg total) by mouth daily. 30 capsule 1  . albuterol (PROVENTIL HFA;VENTOLIN HFA) 108 (90 Base) MCG/ACT inhaler Inhale 2 puffs into the lungs every 4 (four) hours as needed for wheezing or shortness of breath. (Patient not taking: Reported on 01/05/2018) 1 Inhaler 0  .  disulfiram (ANTABUSE) 250 MG tablet Take 1 tablet (250 mg total) by mouth 2 (two) times daily. 60 tablet 1   No current facility-administered medications for this visit.      Musculoskeletal: Strength & Muscle Tone: within normal limits Gait & Station: normal Patient leans: N/A  Psychiatric Specialty Exam: Review of Systems  Constitutional: Negative for chills and fever.  Gastrointestinal: Positive for abdominal pain, nausea and vomiting. Negative for heartburn.  Neurological: Negative for weakness.  Blood pressure 136/82, pulse (!) 109, height 5\' 9"  (1.753 m), weight 147 lb (66.7 kg).Body mass index is 21.71 kg/m.  General Appearance: Casual  Eye Contact:  Good  Speech:  Clear and Coherent and Normal Rate  Volume:  Normal  Mood:  Anxious  Affect:  Congruent  Thought Process:  Coherent and Descriptions of Associations: Intact  Orientation:  Full (Time, Place, and Person)  Thought Content: Rumination   Suicidal Thoughts:  No  Homicidal Thoughts:  No  Memory:  Immediate;   Good Recent;   Good Remote;   Good  Judgement:  Poor  Insight:  Shallow  Psychomotor Activity:  Tremor  Concentration:  Concentration: Good and Attention Span: Good  Recall:  Good  Fund of Knowledge: Good  Language: Good  Akathisia:  No  Handed:  Right  AIMS (if indicated): not done  Assets:  Desire for Improvement Housing Transportation Vocational/Educational  ADL's:  Intact  Cognition: WNL  Sleep:  Poor   Screenings: AIMS     Admission (Discharged) from 06/29/2017 in [facility]  AIMS Total Score  0    AUDIT     Admission (Discharged) from 06/29/2017 in [facility] Admission (Discharged) from 09/06/2014 in [facility] Admission (Discharged) from 06/01/2014 in [facility] Admission (Discharged) from 05/05/2014 in [facility] Admission (Discharged) from  03/16/2014 in [facility]  Alcohol Use Disorder Identification Test Final Score (AUDIT)  35  26  23  27  29     GAD-7     Office Visit from 12/19/2017 in [facility] Office Visit from 11/12/2017 in [facility] Office Visit from 10/22/2017 in [facility] Office Visit from 06/18/2017 in [facility] Office Visit from 03/26/2017 in [facility]  Total GAD-7 Score  6  9  16  9  1     PHQ2-9     Office Visit from 12/19/2017 in [facility] Office Visit from 11/12/2017 in [facility] Office Visit from 10/22/2017 in [place] Office Visit from 06/18/2017 in [facility] Office Visit from 03/26/2017 in [facility]  PHQ-2 Total Score  0  0  0  3  0  PHQ-9 Total Score  0  4  9  11   0       Assessment and Plan: GAD; panic disorder by history; alcohol use disorder- mild    Medication management with supportive therapy. Risks and benefits, side effects and alternative treatment options discussed with patient. Pt was given an opportunity to ask questions about medication, illness, and treatment. All current psychiatric medications have been reviewed and discussed with the patient and adjusted as clinically appropriate. The patient has been provided an accurate and updated list of the medications being now prescribed. Patient expressed understanding of how their medications were to be used.  Pt verbalized understanding and verbal consent obtained for treatment.  Status of current problems: worsening  Meds: Effexor XR 75 mg p.o. daily for GAD Neurontin p.o. 3 times daily for off label treatment of GAD Start Antabuse 250mg  po BID for alcohol use  Labs: none  Therapy: brief supportive  therapy provided. Discussed psychosocial stressors in detail.   Recommended pt stop all  drug and alcohol use. He is thinking about trying AA and I encouraged him to try it  Consultations: Referred for  Substance abuse therapy Encouraged to follow up with PCP as needed  Pt denies SI and is at an acute low risk for suicide. Patient told to call clinic if any problems occur. Patient advised to go to ER if they should develop SI/HI, side effects, or if symptoms worsen. Has crisis numbers to call if needed. Pt verbalized understanding.  F/up in 6 weeks or sooner if needed    Charlcie Cradle, MD 02/07/2018, 4:43 PM

## 2018-02-12 IMAGING — DX DG CHEST 2V
2 series · 2 of 2 positions shown · non-contrast
Comparison: Chest radiograph March 18, 2017

CLINICAL DATA: Acute on chronic chest pain. Alcohol intake today.
History of sarcoidosis.

EXAM:
CHEST  2 VIEW

[chest lat]
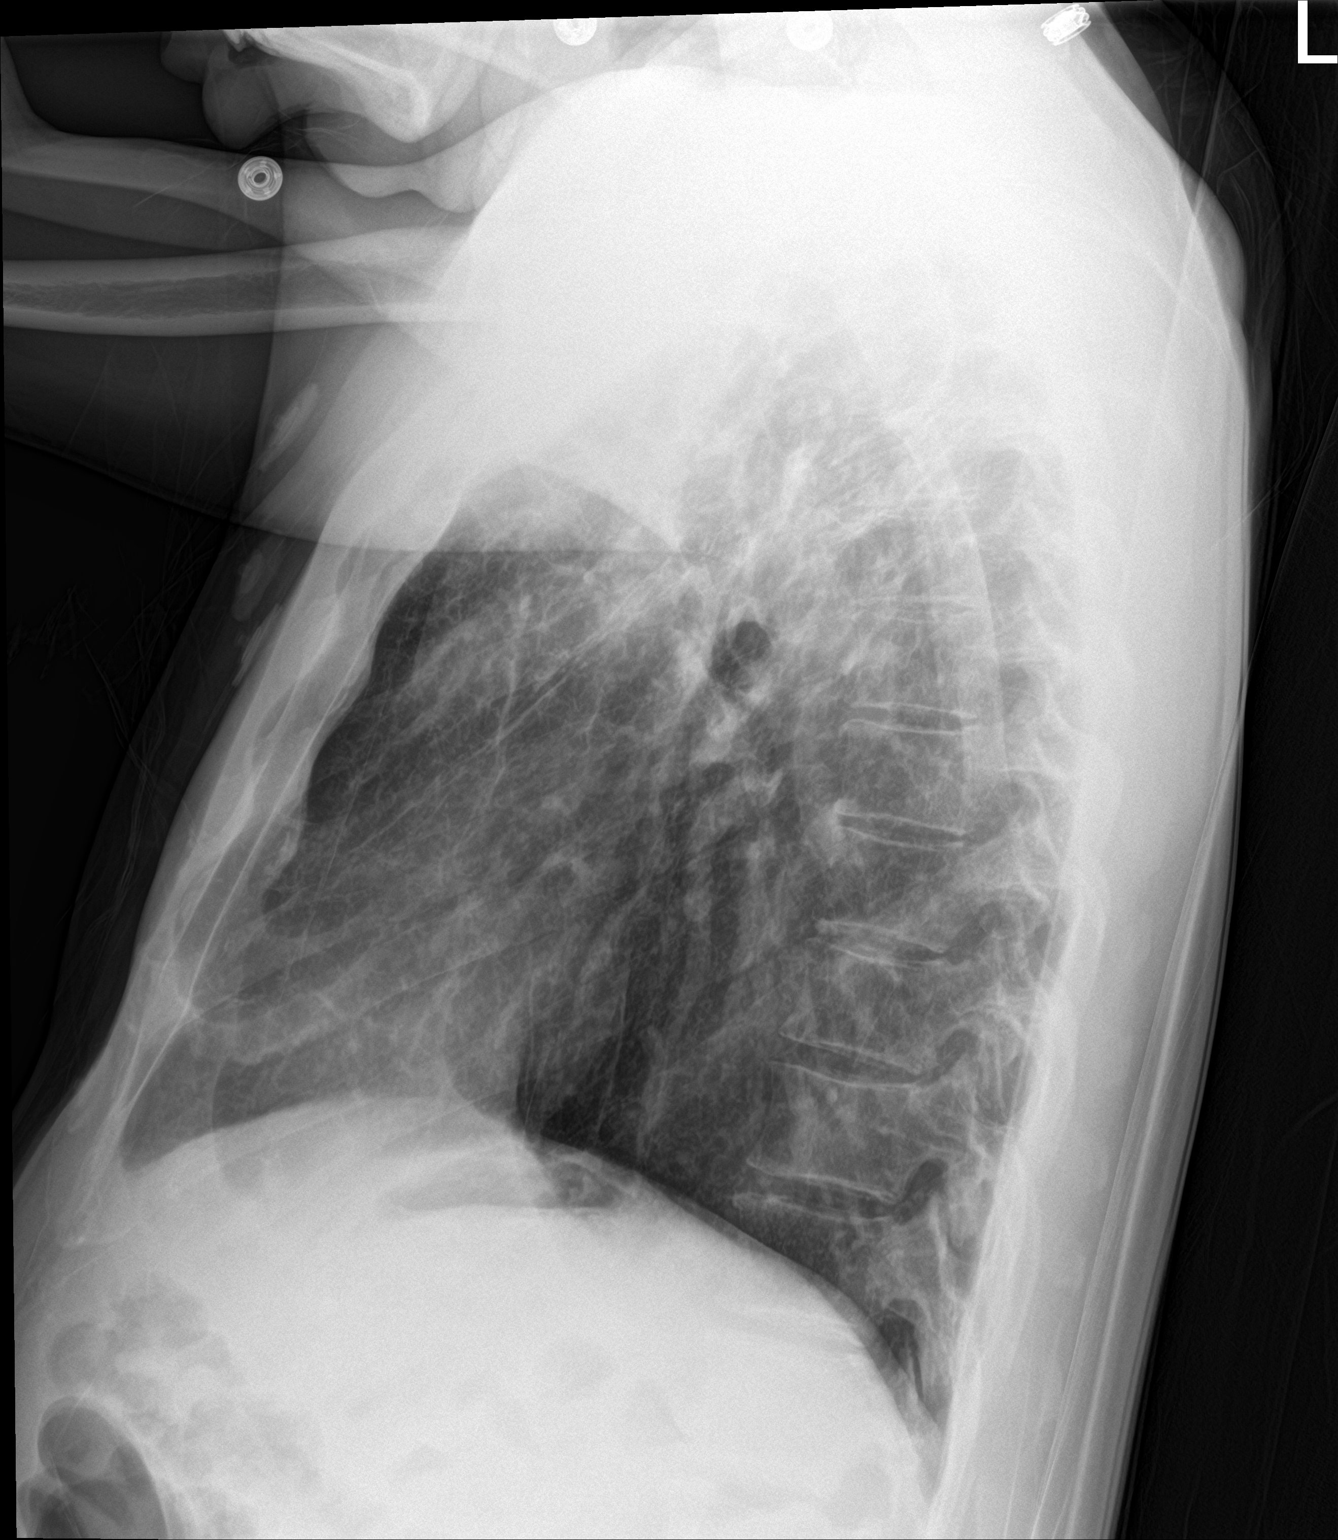

[chest ap]
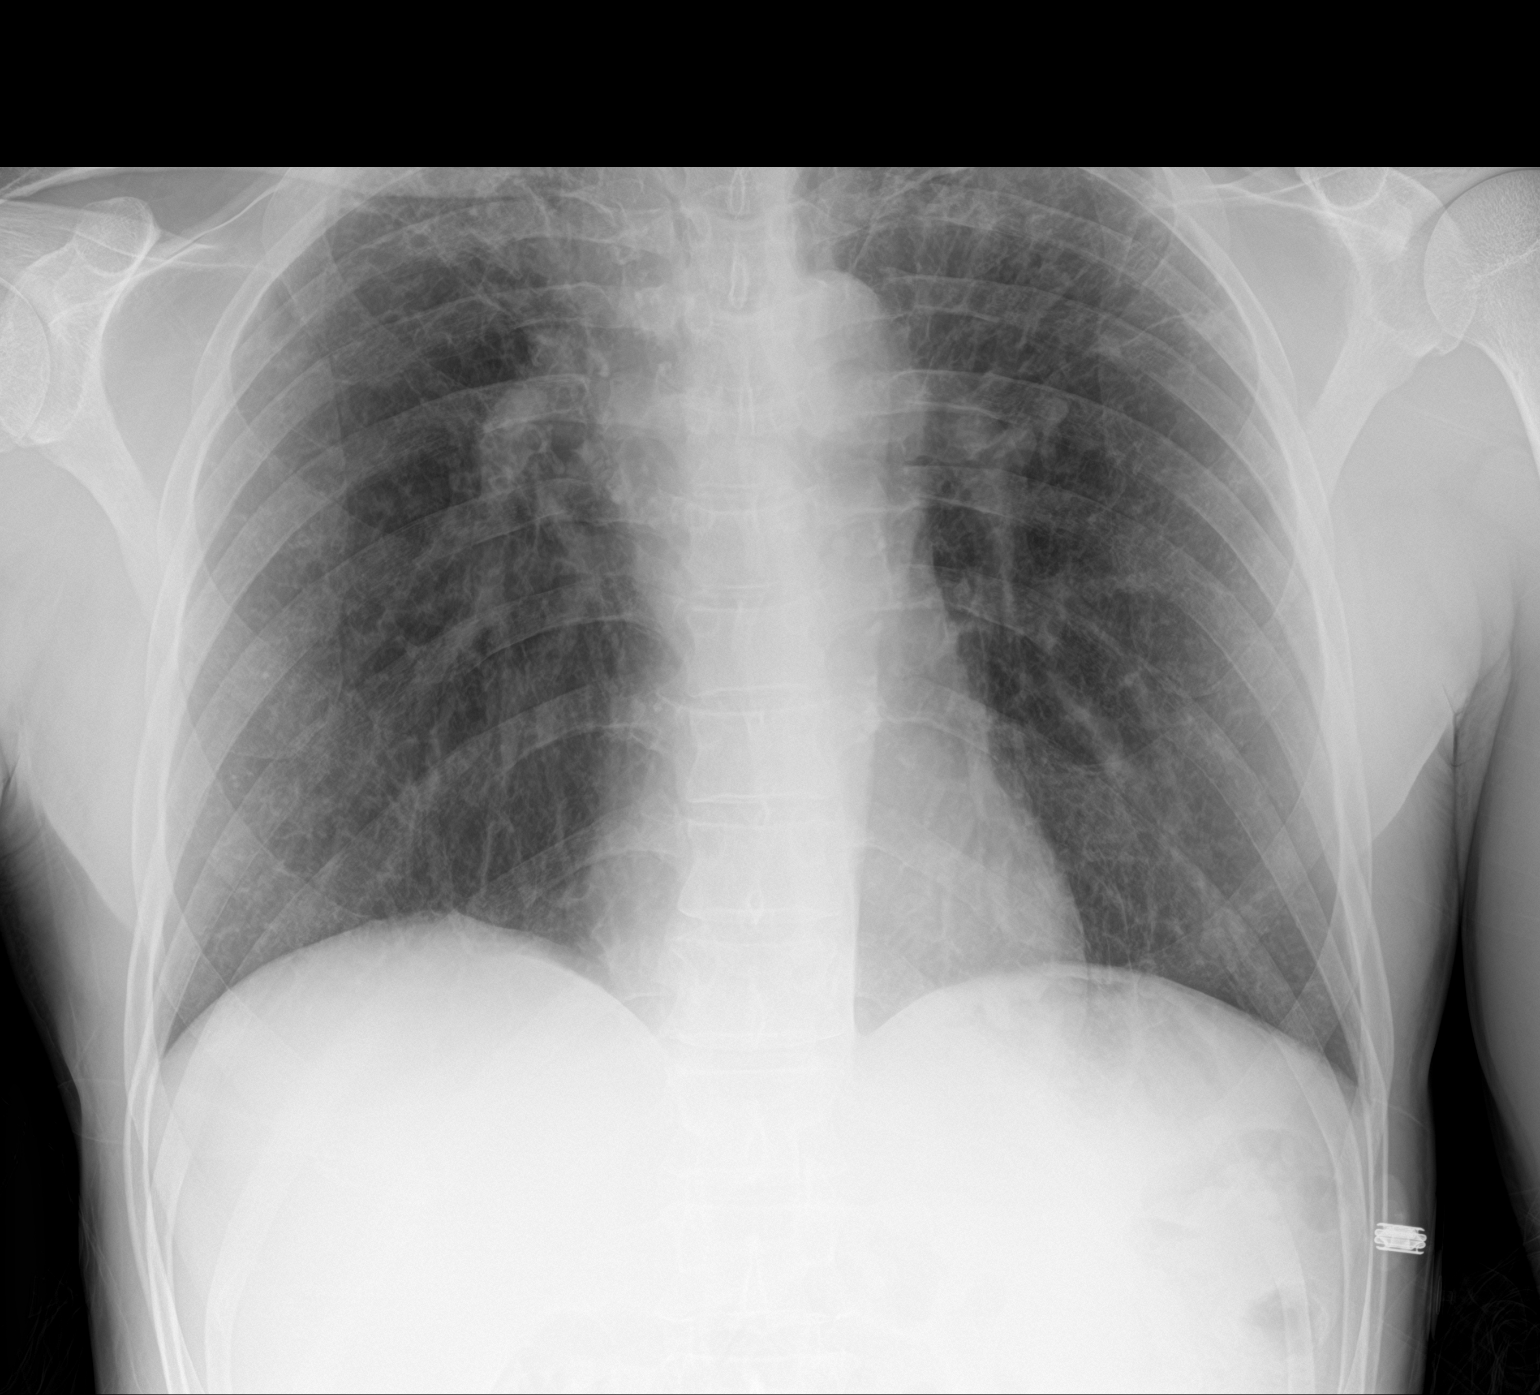

[2 of 2 positions shown; findings below may reference images not displayed]

FINDINGS: Cardiomediastinal silhouette is unremarkable. Biapical scarring with
hilar retraction. No pleural effusion or focal consolidation. No
pneumothorax. Soft tissue planes and included osseous structures are
nonsuspicious.
IMPRESSION: Stable biapical scarring attributable to patient's sarcoidosis.

## 2018-02-20 ENCOUNTER — Ambulatory Visit: Payer: Self-pay | Admitting: Nurse Practitioner

## 2018-02-27 ENCOUNTER — Emergency Department (HOSPITAL_COMMUNITY): Payer: No Typology Code available for payment source

## 2018-02-27 ENCOUNTER — Other Ambulatory Visit: Payer: Self-pay

## 2018-02-27 ENCOUNTER — Emergency Department (HOSPITAL_COMMUNITY)
Admission: EM | Admit: 2018-02-27 | Discharge: 2018-02-27 | Disposition: A | Discharge status: Home / Self Care | Payer: No Typology Code available for payment source | Attending: Emergency Medicine | Admitting: Emergency Medicine

## 2018-02-27 ENCOUNTER — Encounter (HOSPITAL_COMMUNITY): Payer: Self-pay

## 2018-02-27 DIAGNOSIS — R1013 Epigastric pain: Secondary | ICD-10-CM | POA: Insufficient documentation

## 2018-02-27 LAB — BASIC METABOLIC PANEL
Anion gap: 11 (ref 5–15)
BUN: 11 mg/dL (ref 6–20)
CO2: 23 mmol/L (ref 22–32)
Calcium: 9.4 mg/dL (ref 8.9–10.3)
Chloride: 97 mmol/L — ABNORMAL LOW (ref 101–111)
Creatinine, Ser: 0.92 mg/dL (ref 0.61–1.24)
GFR calc Af Amer: >60 mL/min (ref >60)
GFR calc non Af Amer: >60 mL/min (ref >60)
Glucose, Bld: 92 mg/dL (ref 65–99)
Potassium: 4.1 mmol/L (ref 3.5–5.1)
Sodium: 131 mmol/L — ABNORMAL LOW (ref 135–145)

## 2018-02-27 LAB — CBC
HCT: 43.4 % (ref 39.0–52.0)
Hemoglobin: 13.8 g/dL (ref 13.0–17.0)
MCH: 25.6 pg — ABNORMAL LOW (ref 26.0–34.0)
MCHC: 31.8 g/dL (ref 30.0–36.0)
MCV: 80.4 fL (ref 78.0–100.0)
Platelets: 245 10*3/uL (ref 150–400)
RBC: 5.4 MIL/uL (ref 4.22–5.81)
RDW: 15.5 % (ref 11.5–15.5)
WBC: 6.7 10*3/uL (ref 4.0–10.5)

## 2018-02-27 LAB — I-STAT TROPONIN, ED: Troponin i, poc: 0 ng/mL (ref 0.00–0.08)

## 2018-02-27 MED ORDER — LACTATED RINGERS IV BOLUS
1000.0000 mL | Freq: Once | INTRAVENOUS | Status: AC
  Administered 2018-02-27: 1000 mL via INTRAVENOUS

## 2018-02-27 MED ORDER — CHLORDIAZEPOXIDE HCL 25 MG PO CAPS: ORAL_CAPSULE | ORAL | 0 refills | Status: DC

## 2018-02-27 MED ORDER — ONDANSETRON HCL 4 MG PO TABS: 4.0000 mg | ORAL_TABLET | Freq: Three times a day (TID) | ORAL | 0 refills | Status: DC | PRN

## 2018-02-27 MED ORDER — PANTOPRAZOLE SODIUM 20 MG PO TBEC: 20.0000 mg | DELAYED_RELEASE_TABLET | Freq: Two times a day (BID) | ORAL | 0 refills | Status: DC

## 2018-02-27 MED ORDER — FAMOTIDINE IN NACL 20-0.9 MG/50ML-% IV SOLN
20.0000 mg | Freq: Once | INTRAVENOUS | Status: AC
  Administered 2018-02-27: 20 mg via INTRAVENOUS
  Filled 2018-02-27: qty 50

## 2018-02-27 MED ORDER — PROMETHAZINE HCL 25 MG/ML IJ SOLN
12.5000 mg | Freq: Once | INTRAMUSCULAR | Status: AC
  Administered 2018-02-27: 12.5 mg via INTRAVENOUS
  Filled 2018-02-27: qty 1

## 2018-02-27 MED ORDER — LORAZEPAM 2 MG/ML IJ SOLN
1.0000 mg | Freq: Once | INTRAMUSCULAR | Status: AC
  Administered 2018-02-27: 1 mg via INTRAVENOUS
  Filled 2018-02-27: qty 1

## 2018-02-27 MED ORDER — GI COCKTAIL ~~LOC~~
30.0000 mL | Freq: Once | ORAL | Status: AC
Start: 2018-02-27 — End: 2018-02-27
  Administered 2018-02-27: 30 mL via ORAL
  Filled 2018-02-27: qty 30

## 2018-02-27 NOTE — ED Triage Notes (Addendum)
Patient c/o abdominal pain, reflux, and vomiting. Patient reports a history of pancreatitis and states that he has had a relapse from sobriety and has been drinking 2 months. Patient states heavy alcohol x 2 weeks. At the end of triage , patient mentioned that he has been having intermittent chest pain since yesterday.

## 2018-02-27 NOTE — ED Provider Notes (Signed)
[facility] DEPT Provider Note   CSN: [id number] Arrival date & time: 02/27/18  7591     History   Chief Complaint Chief Complaint  Patient presents with  . Abdominal Pain  . Emesis  . Chest Pain    HPI [name] [name] is a 57 y.o. male.  HPI   57 year old male with abdominal pain and nausea/vomiting.  He has a past history of alcohol abuse and relate THAT about 2 months ago.  Increasing consumption over the past 2 weeks.  The past several days she has had upper abdominal pain, nausea, reflux symptoms and has vomited a few times.  No blood or coffee grounds.  No respiratory complaints.  Last drink was yesterday. Reports past history of gastritis and pancreatitis related to etoh abuse.   Past Medical History:  Diagnosis Date  . Alcohol abuse    Last Drink on Sep 2018  . Anxiety    About 57 years of age  . Carpal tunnel syndrome of right wrist Dx 2015  . Depression    At 57 years of age  . GERD (gastroesophageal reflux disease) Dx 2007  . Hepatitis, alcoholic, acute 6384  . Hyperlipidemia    borderline, diet controlled  . Pancreatitis Dx 2014  . Pneumonia 11/2014  . 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 Reflux Dx 2007  . Sarcoidosis of lung [facility]) Dx 1989    Patient Active Problem List   Diagnosis Date Noted  . 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 Lateral epicondylitis of right elbow 01/05/2017  . Strain, MCP, hand, right, initial encounter 01/05/2017  . 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 Bilateral knee pain 01/11/2016  . Chronic pain of right wrist 07/07/2015  . Carpal tunnel syndrome of right wrist 05/20/2015  . Dislocation of right shoulder joint 05/20/2015  . Tear of medial meniscus of right knee 03/24/2015  . 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 Chronic cough 01/15/2015  . Onychomycosis of toenail 12/15/2014  . Seborrhea 11/11/2014  . GERD (gastroesophageal reflux disease)   . Alcohol abuse 01/19/2014    . Alcohol dependence with unspecified alcohol-induced disorder ([place]) 09/02/2013  . Generalized anxiety disorder 09/02/2013    Past Surgical History:  Procedure Laterality Date  . carpel tunnel release Right 07/28/2014    done in [place]   . ELBOW SURGERY    . KNEE ARTHROSCOPY    . KNEE SURGERY Left    15 years ago  . LIPOMA EXCISION N/A 05/10/2016   Procedure: EXCISION OF SCALP LIPOMA;  Surgeon: Johnathan Hausen, MD;  Location: [place];  Service: General;  Laterality: N/A;        Home Medications    Prior to Admission medications   Medication Sig Start Date End Date Taking? Authorizing Provider  famotidine (PEPCID) 20 MG tablet Take 20 mg by mouth daily as needed for heartburn or indigestion.   Yes [provider]  gabapentin (NEURONTIN) 100 MG capsule Take 1 capsule (100 mg total) by mouth 3 (three) times daily. 02/07/18 02/07/19 Yes Charlcie Cradle, MD  omeprazole (PRILOSEC) 20 MG capsule Take 1 capsule (20 mg total) by mouth daily. 02/06/18 05/07/18 Yes Gildardo Pounds, NP  venlafaxine XR (EFFEXOR XR) 75 MG 24 hr capsule Take 1 capsule (75 mg total) by mouth daily. 02/07/18 02/07/19 Yes Charlcie Cradle, MD  albuterol (PROVENTIL HFA;VENTOLIN HFA) 108 (90 Base) MCG/ACT inhaler Inhale 2 puffs into the lungs every 4 (four) hours as needed for wheezing or shortness of  breath. Patient not taking: Reported on 01/05/2018 03/01/17   Janne Napoleon, NP  disulfiram (ANTABUSE) 250 MG tablet Take 1 tablet (250 mg total) by mouth 2 (two) times daily. 02/07/18   Charlcie Cradle, MD  ondansetron (ZOFRAN) 4 MG tablet Take 1 tablet (4 mg total) by mouth every 8 (eight) hours as needed for nausea or vomiting. Patient not taking: Reported on 02/27/2018 01/05/18   Quintella Reichert, MD    Family History Family History  Problem Relation Age of Onset  . Diabetes Father   . Hypertension Father   . Hypertension Paternal Grandfather   . Alcohol abuse Paternal Grandfather   . Alcohol abuse  Maternal Grandfather   . Alcohol abuse Maternal Grandmother   . Alcohol abuse Paternal Grandmother   . Colon cancer Neg Hx   . Rectal cancer Neg Hx   . Stomach cancer Neg Hx   . Bipolar disorder Neg Hx   . Depression Neg Hx     Social History Social History   Tobacco Use  . Smoking status: Never Smoker  . Smokeless tobacco: Never Used  Substance Use Topics  . Alcohol use: Yes    Alcohol/week: 0.0 oz    Frequency: Never  . Drug use: Not Currently    Types: Marijuana    Comment: Patient denies      Allergies   Oxycodone   Review of Systems Review of Systems  All systems reviewed and negative, other than as noted in HPI.  Physical Exam Updated Vital Signs BP 131/89   Pulse 73   Temp 97.7 F (36.5 C) (Oral)   Resp 18   Ht 5\' 9"  (1.753 m)   Wt 68 kg (150 lb)   SpO2 99%   BMI 22.15 kg/m   Physical Exam  Constitutional: He appears well-developed and well-nourished. No distress.  HENT:  Head: Normocephalic and atraumatic.  Eyes: Conjunctivae are normal. Right eye exhibits no discharge. Left eye exhibits no discharge.  Neck: Neck supple.  Cardiovascular: Normal rate, regular rhythm and normal heart sounds. Exam reveals no gallop and no friction rub.  No murmur heard. Pulmonary/Chest: Effort normal and breath sounds normal. No respiratory distress.  Abdominal: Soft. He exhibits no distension. There is tenderness in the epigastric area. There is no rigidity, no rebound and no guarding.  Musculoskeletal: He exhibits no edema or tenderness.  Neurological: He is alert.  Skin: Skin is warm and dry.  Psychiatric: He has a normal mood and affect. His behavior is normal. Thought content normal.  Nursing note and vitals reviewed.    ED Treatments / Results  Labs (all labs ordered are listed, but only abnormal results are displayed) Labs Reviewed  BASIC METABOLIC PANEL - Abnormal; Notable for the following components:      Result Value   Sodium 131 (*)    Chloride  97 (*)    All other components within normal limits  CBC - Abnormal; Notable for the following components:   MCH 25.6 (*)    All other components within normal limits  LIPASE, BLOOD  I-STAT TROPONIN, ED    EKG EKG Interpretation  Date/Time:  Wednesday February 27 2018 07:40:17 EDT Ventricular Rate:  75 PR Interval:    QRS Duration: 90 QT Interval:  350 QTC Calculation: 391 R Axis:   70 Text Interpretation:  Sinus rhythm Left ventricular hypertrophy Anterior Q waves, possibly due to LVH No significant change since last tracing Confirmed by Duffy Bruce [phone]) on 02/27/2018 8:07:50 AM   Radiology  Dg Chest 2 View  Result Date: 02/27/2018 CLINICAL DATA:  Chest pain.  History of sarcoidosis. EXAM: CHEST - 2 VIEW COMPARISON:  July 22, 2017 FINDINGS: There is fibrosis in the upper lobes, more severe on the right than on the left. There is volume loss in the upper lobes with retraction of the pulmonary vessels superiorly. There is no evident edema or consolidation. Heart size and pulmonary vascularity are normal. No evident adenopathy. No bone lesions. IMPRESSION: Upper lobe scarring with retraction and volume loss. These are changes likely due to prior sarcoidosis. No edema or consolidation. Stable cardiac silhouette. No adenopathy evident. Electronically Signed   By: Lowella Grip III M.D.   On: 02/27/2018 08:23    Procedures Procedures (including critical care time)  Medications Ordered in ED Medications  lactated ringers bolus 1,000 mL (0 mLs Intravenous Stopped 02/27/18 0923)  promethazine (PHENERGAN) injection 12.5 mg (12.5 mg Intravenous Given 02/27/18 0852)  LORazepam (ATIVAN) injection 1 mg (1 mg Intravenous Given 02/27/18 0836)  famotidine (PEPCID) IVPB 20 mg premix (0 mg Intravenous Stopped 02/27/18 0923)  gi cocktail (Maalox,Lidocaine,Donnatal) (30 mLs Oral Given 02/27/18 0836)     Initial Impression / Assessment and Plan / ED Course  I have reviewed the triage vital signs and  the nursing notes.  Pertinent labs & imaging results that were available during my care of the patient were reviewed by me and considered in my medical decision making (see chart for details).     57 year old male with abdominal pain.  Mild epigastric tenderness on exam.  Low suspicion for acute surgical process.  Possibly PUD versus gastritis.  Alcohol likely playing a factor.  Lipase is normal.  Tolerating p.o.  Plan continue symptomatic treatment.  PPI.  Abstain from alcohol.  Librium taper as patient is trying to cease alcohol again.  Final Clinical Impressions(s) / ED Diagnoses   Final diagnoses:  Epigastric pain    ED Discharge Orders    None       Virgel Manifold, MD 02/27/18 1124

## 2018-02-28 ENCOUNTER — Ambulatory Visit (HOSPITAL_COMMUNITY): Payer: Self-pay | Admitting: Psychiatry

## 2018-02-28 MED FILL — CHLORDIAZEPOXIDE 25 MG CAP: 25 | 3 days supply | Qty: 10 | Fill #0

## 2018-02-28 MED FILL — PANTOPRAZOLE SOD DR 20 MG T: 20 | 15 days supply | Qty: 30 | Fill #0

## 2018-03-05 ENCOUNTER — Ambulatory Visit (HOSPITAL_COMMUNITY): Payer: Self-pay | Admitting: Licensed Clinical Social Worker

## 2018-03-29 ENCOUNTER — Emergency Department (HOSPITAL_COMMUNITY)
Admission: EM | Admit: 2018-03-29 | Discharge: 2018-03-29 | Disposition: A | Discharge status: Home / Self Care | Payer: No Typology Code available for payment source | Attending: Emergency Medicine | Admitting: Emergency Medicine

## 2018-03-29 ENCOUNTER — Encounter (HOSPITAL_COMMUNITY): Payer: Self-pay | Admitting: Emergency Medicine

## 2018-03-29 DIAGNOSIS — F1010 Alcohol abuse, uncomplicated: Secondary | ICD-10-CM | POA: Insufficient documentation

## 2018-03-29 DIAGNOSIS — F32A Depression, unspecified: Secondary | ICD-10-CM

## 2018-03-29 DIAGNOSIS — Z590 Homelessness: Secondary | ICD-10-CM | POA: Insufficient documentation

## 2018-03-29 DIAGNOSIS — Z79899 Other long term (current) drug therapy: Secondary | ICD-10-CM | POA: Insufficient documentation

## 2018-03-29 DIAGNOSIS — F329 Major depressive disorder, single episode, unspecified: Secondary | ICD-10-CM | POA: Insufficient documentation

## 2018-03-29 LAB — COMPREHENSIVE METABOLIC PANEL
ALT: 301 U/L — ABNORMAL HIGH (ref 17–63)
AST: 374 U/L — ABNORMAL HIGH (ref 15–41)
Albumin: 4.3 g/dL (ref 3.5–5.0)
Alkaline Phosphatase: 30 U/L — ABNORMAL LOW (ref 38–126)
Anion gap: 15 (ref 5–15)
BUN: 8 mg/dL (ref 6–20)
CO2: 24 mmol/L (ref 22–32)
Calcium: 9 mg/dL (ref 8.9–10.3)
Chloride: 94 mmol/L — ABNORMAL LOW (ref 101–111)
Creatinine, Ser: 0.92 mg/dL (ref 0.61–1.24)
GFR calc Af Amer: >60 mL/min (ref >60)
GFR calc non Af Amer: >60 mL/min (ref >60)
Glucose, Bld: 90 mg/dL (ref 65–99)
Potassium: 3.9 mmol/L (ref 3.5–5.1)
Sodium: 133 mmol/L — ABNORMAL LOW (ref 135–145)
Total Bilirubin: 1.2 mg/dL (ref 0.3–1.2)
Total Protein: 7.7 g/dL (ref 6.5–8.1)

## 2018-03-29 LAB — CBC
HCT: 38.4 % — ABNORMAL LOW (ref 39.0–52.0)
Hemoglobin: 12.3 g/dL — ABNORMAL LOW (ref 13.0–17.0)
MCH: 25.6 pg — ABNORMAL LOW (ref 26.0–34.0)
MCHC: 32 g/dL (ref 30.0–36.0)
MCV: 80 fL (ref 78.0–100.0)
Platelets: 158 10*3/uL (ref 150–400)
RBC: 4.8 MIL/uL (ref 4.22–5.81)
RDW: 16.9 % — ABNORMAL HIGH (ref 11.5–15.5)
WBC: 4.3 10*3/uL (ref 4.0–10.5)

## 2018-03-29 LAB — RAPID URINE DRUG SCREEN, HOSP PERFORMED
Amphetamines: NOT DETECTED
Barbiturates: NOT DETECTED
Benzodiazepines: POSITIVE — AB
Cocaine: NOT DETECTED
Opiates: NOT DETECTED
Tetrahydrocannabinol: NOT DETECTED

## 2018-03-29 LAB — ETHANOL: Alcohol, Ethyl (B): 319 mg/dL (ref <10)

## 2018-03-29 MED ORDER — CHLORDIAZEPOXIDE HCL 25 MG PO CAPS: ORAL_CAPSULE | ORAL | 0 refills | Status: DC

## 2018-03-29 MED ORDER — ADULT MULTIVITAMIN W/MINERALS CH
1.0000 | ORAL_TABLET | Freq: Once | ORAL | Status: AC
  Administered 2018-03-29: 1 via ORAL
  Filled 2018-03-29: qty 1

## 2018-03-29 MED ORDER — FAMOTIDINE 20 MG PO TABS
40.0000 mg | ORAL_TABLET | Freq: Once | ORAL | Status: AC
  Administered 2018-03-29: 40 mg via ORAL
  Filled 2018-03-29 (×2): qty 2

## 2018-03-29 MED ORDER — LORAZEPAM 2 MG/ML IJ SOLN
1.0000 mg | Freq: Once | INTRAMUSCULAR | Status: AC
  Administered 2018-03-29: 1 mg via INTRAVENOUS
  Filled 2018-03-29: qty 1

## 2018-03-29 MED ORDER — LORAZEPAM 1 MG PO TABS
1.0000 mg | ORAL_TABLET | Freq: Once | ORAL | Status: AC
  Administered 2018-03-29: 1 mg via ORAL
  Filled 2018-03-29: qty 1

## 2018-03-29 MED ORDER — LACTATED RINGERS IV BOLUS
1000.0000 mL | Freq: Once | INTRAVENOUS | Status: AC
  Administered 2018-03-29: 1000 mL via INTRAVENOUS

## 2018-03-29 NOTE — ED Notes (Addendum)
319 - Ethanol level, critical received.  Will continue to monitor

## 2018-03-29 NOTE — ED Triage Notes (Addendum)
Pt states he wants detox from ETOH, last drink right before arrival. Pt states he had 3 24 ounce cans of beer prior to arrival. States he also has not eaten in 3 days due to being homeless. States "my stomach hurts too because I am hungry and I also wants some IV fluids." Denies SI. Pt requests consult with Education officer, museum.

## 2018-03-29 NOTE — BH Assessment (Addendum)
Tele Assessment Note   Patient Name: [name] [name] MRN: [id number] Referring Physician: Virgel Manifold, MD Location of Patient: [facility] Location of Provider: [facility]  Beth Spackman is an 57 y.o. male who presents to the ED voluntarily. Pt states he relapsed on alcohol about 2 months ago after being sober for a year. Pt states he recently started working and feels if he does not get treatment he is going to lose his job. Pt states he consumes up to 3 or 4 40oz beers daily. Pt BAL 319 on arrival to ED. Pt states he has not been eating or sleeping much due to excessive drinking. Pt states he has felt weak and collapsed due to not eating. Pt also reports severe anxiety and states drinking is a way of "self-medicating" because he feels "calm" when he drinks. Pt denies SI, HI and denies AVH. Pt states he has no thoughts of wanting to harm himself or anyone else. Pt states he is only interested in SA treatment. Pt states he has received treatment in the past and is willing to follow up with OPT services. Pt also states he would like to speak with social work in order to receive assistance with housing and other resources. Pt reports he is currently homeless and sleeps outside but believes that situation will change once he starts receiving his paychecks.     TTS consulted with Lindon Romp, NP who states the pt does not meet criteria for inpt treatment. TTS provided resources to the pt via fax ([phone]) for substance abuse treatment facilities. TTS advised RN Cheri Guppy, RN and EDP Virgel Manifold, MD that the pt is requesting a social work consult.   Diagnosis: GAD, severe; Alcohol use disorder, severe  Past Medical History:  Past Medical History:  Diagnosis Date  . Alcohol abuse    Last Drink on Sep 2018  . Anxiety    About 57 years of age  . Carpal tunnel syndrome of right wrist Dx 2015  . Depression    At 57 years of age  . GERD (gastroesophageal reflux disease) Dx  2007  . Hepatitis, alcoholic, acute 6222  . Hyperlipidemia    borderline, diet controlled  . Pancreatitis Dx 2014  . Pneumonia 11/2014  . 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 Reflux Dx 2007  . 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 carpel tunnel release Right 07/28/2014    done in [place]   . ELBOW SURGERY    . KNEE ARTHROSCOPY    . KNEE SURGERY Left    15 years ago  . LIPOMA EXCISION N/A 05/10/2016   Procedure: EXCISION OF SCALP LIPOMA;  Surgeon: Johnathan Hausen, MD;  Location: [facility];  Service: General;  Laterality: N/A;    Family History:  Family History  Problem Relation Age of Onset  . Diabetes Father   . Hypertension Father   . Hypertension Paternal Grandfather   . Alcohol abuse Paternal Grandfather   . Alcohol abuse Maternal Grandfather   . Alcohol abuse Maternal Grandmother   . Alcohol abuse Paternal Grandmother   . Colon cancer Neg Hx   . Rectal cancer Neg Hx   . Stomach cancer Neg Hx   . Bipolar disorder Neg Hx   . Depression Neg Hx     Social History:  reports that he has never smoked. He has never used smokeless tobacco. He reports that he drinks alcohol.  He reports that he has current or past drug history. Drug: Marijuana.  Additional Social History:  Alcohol / Drug Use Pain Medications: See MAR Prescriptions: See MAR Over the Counter: See MAR History of alcohol / drug use?: Yes Longest period of sobriety (when/how long): 1 year Negative Consequences of Use: Financial, Personal relationships, Work / School Withdrawal Symptoms: Patient aware of relationship between substance abuse and physical/medical complications, Diarrhea, Sweats, Nausea / Vomiting, Tremors Substance #1 Name of Substance 1: Alcohol 1 - Age of First Use: 15 1 - Amount (size/oz): 3 40oz beers 1 - Frequency: daily 1 - Duration: ongoing 1 - Last Use / Amount: 03/29/18  CIWA: CIWA-Ar BP:  128/89 Pulse Rate: 90 Nausea and Vomiting: mild nausea with no vomiting Tactile Disturbances: very mild itching, pins and needles, burning or numbness(feet) Tremor: two Auditory Disturbances: not present Paroxysmal Sweats: no sweat visible Visual Disturbances: not present Anxiety: mildly anxious Headache, Fullness in Head: none present Agitation: somewhat more than normal activity Orientation and Clouding of Sensorium: oriented and can do serial additions CIWA-Ar Total: 6 COWS:    Allergies:  Allergies  Allergen Reactions  . Oxycodone Other (See Comments)    abd pain, stomach cramps     Home Medications:  (Not in a hospital admission)  OB/GYN Status:  No LMP for male patient.  General Assessment Data Assessment unable to be completed: Yes Reason for not completing assessment: TTS called to complete the assessment for the pt. Pt is currently in the hallway per Charge nurse Vania Rea, RN. Charge nurse states they are currently unable to place the pt in a private room for the assessment and will call TTS back once the pt is ready to be assessed.  Location of Assessment: [facility] ED TTS Assessment: In system Is this a Tele or Face-to-Face Assessment?: Tele Assessment Is this an Initial Assessment or a Re-assessment for this encounter?: Initial Assessment Marital status: Separated Is patient pregnant?: No Pregnancy Status: No Living Arrangements: Other (Comment)(homeless) Can pt return to current living arrangement?: Yes Admission Status: Voluntary Is patient capable of signing voluntary admission?: Yes Referral Source: Self/Family/Friend Insurance type: [facility] DISCOUNT/[facility] DISCOUNT 100%     Crisis Care Plan Living Arrangements: Other (Comment)(homeless) Name of Psychiatrist: none Name of Therapist: none  Education Status Is patient currently in school?: No Is the patient employed, unemployed or receiving disability?: Employed  Risk to self with the past 6 months Suicidal  Ideation: No Has patient been a risk to self within the past 6 months prior to admission? : No Suicidal Intent: No Has patient had any suicidal intent within the past 6 months prior to admission? : No Is patient at risk for suicide?: No Suicidal Plan?: No Has patient had any suicidal plan within the past 6 months prior to admission? : No Access to Means: No What has been your use of drugs/alcohol within the last 12 months?: reports to daily alcohol use  Previous Attempts/Gestures: No Triggers for Past Attempts: None known Intentional Self Injurious Behavior: None Family Suicide History: No Recent stressful life event(s): Other (Comment)(relapsed on alcohol) Persecutory voices/beliefs?: No Depression: Yes Depression Symptoms: Insomnia, Isolating, Loss of interest in usual pleasures, Feeling worthless/self pity, Guilt Substance abuse history and/or treatment for substance abuse?: Yes Suicide prevention information given to non-admitted patients: Not applicable  Risk to Others within the past 6 months Homicidal Ideation: No Does patient have any lifetime risk of violence toward others beyond the six months prior to admission? : No Thoughts of Harm to  Others: No Current Homicidal Intent: No Current Homicidal Plan: No Access to Homicidal Means: No History of harm to others?: No Assessment of Violence: None Noted Does patient have access to weapons?: No Criminal Charges Pending?: No Does patient have a court date: No Is patient on probation?: No  Psychosis Hallucinations: None noted Delusions: None noted  Mental Status Report Appearance/Hygiene: Unremarkable Eye Contact: Good Motor Activity: Freedom of movement Speech: Logical/coherent Level of Consciousness: Alert Mood: Depressed, Anxious, Helpless Affect: Anxious, Depressed Anxiety Level: Severe Thought Processes: Relevant, Coherent Judgement: Partial Orientation: Person, Place, Time, Situation, Appropriate for  developmental age Obsessive Compulsive Thoughts/Behaviors: None  Cognitive Functioning Concentration: Normal Memory: Remote Intact, Recent Intact Is patient IDD: No Is patient DD?: No Insight: Good Impulse Control: Good Appetite: Poor Have you had any weight changes? : Loss Amount of the weight change? (lbs): 10 lbs Sleep: Decreased Total Hours of Sleep: 4 Vegetative Symptoms: None  ADLScreening [facility] Assessment Services) Patient's cognitive ability adequate to safely complete daily activities?: Yes Patient able to express need for assistance with ADLs?: Yes Independently performs ADLs?: Yes (appropriate for developmental age)  Prior Inpatient Therapy Prior Inpatient Therapy: Yes Prior Therapy Dates: 2018 Prior Therapy Facilty/Provider(s): [facility] Reason for Treatment: ALCOHOL DEPENDENCE  Prior Outpatient Therapy Prior Outpatient Therapy: Yes Prior Therapy Dates: 2019 Prior Therapy Facilty/Provider(s): [facility] OPT Reason for Treatment: MDD, GAD Does patient have an ACCT team?: No Does patient have Intensive In-House Services?  : No Does patient have [facility] services? : No Does patient have P4CC services?: No  ADL Screening (condition at time of admission) Patient's cognitive ability adequate to safely complete daily activities?: Yes Is the patient deaf or have difficulty hearing?: No Does the patient have difficulty seeing, even when wearing glasses/contacts?: No Does the patient have difficulty concentrating, remembering, or making decisions?: No Patient able to express need for assistance with ADLs?: Yes Does the patient have difficulty dressing or bathing?: No Independently performs ADLs?: Yes (appropriate for developmental age) Does the patient have difficulty walking or climbing stairs?: No Weakness of Legs: None Weakness of Arms/Hands: None  Home Assistive Devices/Equipment Home Assistive Devices/Equipment: Eyeglasses    Abuse/Neglect Assessment (Assessment to be  complete while patient is alone) Abuse/Neglect Assessment Can Be Completed: Yes Physical Abuse: Denies Verbal Abuse: Denies Sexual Abuse: Denies Exploitation of patient/patient's resources: Denies Self-Neglect: Denies     Regulatory affairs officer (For Healthcare) Does Patient Have a Medical Advance Directive?: No Would patient like information on creating a medical advance directive?: No - Patient declined    Additional Information 1:1 In Past 12 Months?: No CIRT Risk: No Elopement Risk: No Does patient have medical clearance?: Yes     Disposition: TTS consulted with Lindon Romp, NP who states the pt does not meet criteria for inpt treatment. TTS provided resources to the pt via fax [phone]) for substance abuse treatment facilities. TTS advised RN Cheri Guppy, RN and EDP Virgel Manifold, MD that the pt is requesting a social work consult.   Disposition Initial Assessment Completed for this Encounter: Yes  This service was provided via telemedicine using a 2-way, interactive audio and video technology.  Names of all persons participating in this telemedicine service and their role in this encounter. Name: Kayzen Kendzierski Role: PT  Name: Lind Covert Role: TTS          Lyanne Co 03/29/2018 9:39 PM

## 2018-03-29 NOTE — ED Notes (Signed)
Pt currently with TTS

## 2018-03-29 NOTE — Progress Notes (Signed)
TTS consulted with Lindon Romp, NP who states the pt does not meet criteria for inpt treatment. TTS provided resources to the pt via fax [phone]) for substance abuse treatment facilities. TTS advised RN Cheri Guppy, RN and EDP Virgel Manifold, MD that the pt is requesting a social work consult.   Lind Covert, MSW, LCSW Therapeutic Triage Specialist  [phone]

## 2018-03-29 NOTE — ED Provider Notes (Signed)
[facility] EMERGENCY DEPARTMENT Provider Note   CSN: [id number] Arrival date & time: 03/29/18  0940     History   Chief Complaint Chief Complaint  Patient presents with  . Detox  . homelessness    HPI [name] [name] is a 57 y.o. male.  HPI   57 year old male with a generalized weakness.  Long-standing history of alcohol abuse.  States that he relapsed approximately 2 months ago.  States that he drinks large quantities of beer.  Poor appetite.  Feels generally weak.  Also endorses depression, anxiety and just generally feeling hopeless.  Denies any suicidal homicidal ideation.  He reports that he is homeless, but recently obtained a job with the city of [place]. He is concerned about retaining this position with his alcohol use.    Past Medical History:  Diagnosis Date  . Alcohol abuse    Last Drink on Sep 2018  . Anxiety    About 57 years of age  . Carpal tunnel syndrome of right wrist Dx 2015  . Depression    At 57 years of age  . GERD (gastroesophageal reflux disease) Dx 2007  . Hepatitis, alcoholic, acute 2440  . Hyperlipidemia    borderline, diet controlled  . Pancreatitis Dx 2014  . Pneumonia 11/2014  . 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 Reflux Dx 2007  . Sarcoidosis of lung [facility]) Dx 1989    Patient Active Problem List   Diagnosis Date Noted  . Severe major depression ([place]) 06/30/2017  . Lateral epicondylitis of right elbow 01/05/2017  . Strain, MCP, hand, right, initial encounter 01/05/2017  . 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 Bilateral knee pain 01/11/2016  . Chronic pain of right wrist 07/07/2015  . Carpal tunnel syndrome of right wrist 05/20/2015  . Dislocation of right shoulder joint 05/20/2015  . Tear of medial meniscus of right knee 03/24/2015  . 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 Chronic cough 01/15/2015  . Onychomycosis of toenail 12/15/2014  . Seborrhea 11/11/2014  . GERD (gastroesophageal reflux  disease)   . Alcohol abuse 01/19/2014  . Alcohol dependence with unspecified alcohol-induced disorder ([place]) 09/02/2013  . Generalized anxiety disorder 09/02/2013    Past Surgical History:  Procedure Laterality Date  . carpel tunnel release Right 07/28/2014    done in [place]   . ELBOW SURGERY    . KNEE ARTHROSCOPY    . KNEE SURGERY Left    15 years ago  . LIPOMA EXCISION N/A 05/10/2016   Procedure: EXCISION OF SCALP LIPOMA;  Surgeon: Johnathan Hausen, MD;  Location: [facility];  Service: General;  Laterality: N/A;        Home Medications    Prior to Admission medications   Medication Sig Start Date End Date Taking? Authorizing Provider  chlordiazePOXIDE (LIBRIUM) 25 MG capsule 50mg  PO TID x 1D, then 25-50mg  PO BID X 1D, then 25-50mg  PO QD X 1D 02/27/18   Virgel Manifold, MD  disulfiram (ANTABUSE) 250 MG tablet Take 1 tablet (250 mg total) by mouth 2 (two) times daily. 02/07/18   Charlcie Cradle, MD  famotidine (PEPCID) 20 MG tablet Take 20 mg by mouth daily as needed for heartburn or indigestion.    [provider]  gabapentin (NEURONTIN) 100 MG capsule Take 1 capsule (100 mg total) by mouth 3 (three) times daily. 02/07/18 02/07/19  Charlcie Cradle, MD  omeprazole (PRILOSEC) 20 MG capsule Take 1 capsule (20 mg total) by mouth daily. 02/06/18 05/07/18  Gildardo Pounds,  NP  ondansetron (ZOFRAN) 4 MG tablet Take 1 tablet (4 mg total) by mouth every 8 (eight) hours as needed for nausea or vomiting. Patient not taking: Reported on 02/27/2018 01/05/18   Quintella Reichert, MD  ondansetron (ZOFRAN) 4 MG tablet Take 1 tablet (4 mg total) by mouth every 8 (eight) hours as needed for nausea or vomiting. 02/27/18   Virgel Manifold, MD  pantoprazole (PROTONIX) 20 MG tablet Take 1 tablet (20 mg total) by mouth 2 (two) times daily. 02/27/18   Virgel Manifold, MD  venlafaxine XR (EFFEXOR XR) 75 MG 24 hr capsule Take 1 capsule (75 mg total) by mouth daily. 02/07/18 02/07/19  Charlcie Cradle, MD     Family History Family History  Problem Relation Age of Onset  . Diabetes Father   . Hypertension Father   . Hypertension Paternal Grandfather   . Alcohol abuse Paternal Grandfather   . Alcohol abuse Maternal Grandfather   . Alcohol abuse Maternal Grandmother   . Alcohol abuse Paternal Grandmother   . Colon cancer Neg Hx   . Rectal cancer Neg Hx   . Stomach cancer Neg Hx   . Bipolar disorder Neg Hx   . Depression Neg Hx     Social History Social History   Tobacco Use  . Smoking status: Never Smoker  . Smokeless tobacco: Never Used  Substance Use Topics  . Alcohol use: Yes    Alcohol/week: 0.0 oz    Frequency: Never  . Drug use: Not Currently    Types: Marijuana    Comment: Patient denies      Allergies   Oxycodone   Review of Systems Review of Systems  All systems reviewed and negative, other than as noted in HPI.  Physical Exam Updated Vital Signs BP 134/78 (BP Location: Right Arm)   Pulse 82   Temp 98.2 F (36.8 C) (Oral)   Resp 16   SpO2 100%   Physical Exam  Constitutional: He appears well-developed and well-nourished. No distress.  HENT:  Head: Normocephalic and atraumatic.  Eyes: Conjunctivae are normal. Right eye exhibits no discharge. Left eye exhibits no discharge.  Neck: Neck supple.  Cardiovascular: Normal rate, regular rhythm and normal heart sounds. Exam reveals no gallop and no friction rub.  No murmur heard. Pulmonary/Chest: Effort normal and breath sounds normal. No respiratory distress.  Abdominal: Soft. He exhibits no distension. There is tenderness.  Epigastric tenderness w/o rebound or guarding  Musculoskeletal: He exhibits no edema or tenderness.  Neurological: He is alert.  Skin: Skin is warm and dry.  Psychiatric: He has a normal mood and affect. His behavior is normal. Thought content normal.  Nursing note and vitals reviewed.    ED Treatments / Results  Labs (all labs ordered are listed, but only abnormal results  are displayed) Labs Reviewed  COMPREHENSIVE METABOLIC PANEL - Abnormal; Notable for the following components:      Result Value   Sodium 133 (*)    Chloride 94 (*)    AST 374 (*)    ALT 301 (*)    Alkaline Phosphatase 30 (*)    All other components within normal limits  ETHANOL - Abnormal; Notable for the following components:   Alcohol, Ethyl (B) 319 (*)    All other components within normal limits  CBC - Abnormal; Notable for the following components:   Hemoglobin 12.3 (*)    HCT 38.4 (*)    MCH 25.6 (*)    RDW 16.9 (*)    All  other components within normal limits  RAPID URINE DRUG SCREEN, HOSP PERFORMED - Abnormal; Notable for the following components:   Benzodiazepines POSITIVE (*)    All other components within normal limits    EKG None  Radiology No results found.  Procedures Procedures (including critical care time)  Medications Ordered in ED Medications  lactated ringers bolus 1,000 mL (has no administration in time range)  multivitamin with minerals tablet 1 tablet (has no administration in time range)  famotidine (PEPCID) tablet 40 mg (has no administration in time range)     Initial Impression / Assessment and Plan / ED Course  I have reviewed the triage vital signs and the nursing notes.  Pertinent labs & imaging results that were available during my care of the patient were reviewed by me and considered in my medical decision making (see chart for details).    57 year old male with alcohol abuse.  Long standing history of the same.  Reports feeling hopeless and depressed but he is not suicidal homicidal.  He is not psychotic.  He insists that he is dehydrated.  His labs do not look too bad but we did place an IV and give him some fluids.  He was fed.  Multivitamin.  Advised patient that he really needs to get a handle on his alcohol use.  He will have a lot of continued health and social problems until he gets sober.  There is no indication for emergent  hospitalization from medical or psychiatric standpoint.  He will be discharged with resource information.  And will be up to him to get the appropriate treatment that he needs.  Final Clinical Impressions(s) / ED Diagnoses   Final diagnoses:  Alcohol abuse  Depression, unspecified depression type    ED Discharge Orders    None       Virgel Manifold, MD 04/02/18 [phone]

## 2018-03-29 NOTE — ED Notes (Signed)
Patient up to desk, asking about wait, asking for another [place] sandwich.  Patient already had one bag, will encourage patient to wait until he is seen by MD.  VSS

## 2018-03-29 NOTE — Progress Notes (Signed)
TTS called to complete the assessment for the pt. Pt is currently in the hallway per Charge nurse Vania Rea, RN. Charge nurse states they are currently unable to place the pt in a private room for the assessment and will call TTS back once the pt is ready to be assessed.   Lind Covert, MSW, LCSW Therapeutic Triage Specialist  [phone]

## 2018-04-04 ENCOUNTER — Ambulatory Visit (HOSPITAL_COMMUNITY): Payer: No Typology Code available for payment source | Admitting: Psychiatry

## 2018-04-04 NOTE — Progress Notes (Deleted)
BH MD/PA/NP OP Progress Note  04/04/2018 9:14 AM [name] [name]  MRN:  [id number]  Chief Complaint:  HPI: *** Visit Diagnosis: No diagnosis found.  Past Psychiatric History:  Dx:GAD; Panic disorder; Alcohol use disorder all dx by PCP -learning disability- was in special ed classes in school- anxious in school and used to stutter. At the age of 64 he drank his first beer. It made him feel more relaxed. Meds:Buspar, Paxil Previous psychiatrist/therapist:[facility] x1 visit, otherwise tried to manage with PCP Hospitalizations:detox several times TXM:IWOEHO Suicide attempts:denies Hx of violent behavior towards others:denies Current access to guns:denies Hx of abuse:denies Military ZY:YQMGNO Hx of Seizures:denies Hx of IBB:CWUGQB    Past Medical History:  Past Medical History:  Diagnosis Date  . Alcohol abuse    Last Drink on Sep 2018  . Anxiety    About 56 years of age  . Carpal tunnel syndrome of right wrist Dx 2015  . Depression    At 57 years of age  . GERD (gastroesophageal reflux disease) Dx 2007  . Hepatitis, alcoholic, acute 1694  . Hyperlipidemia    borderline, diet controlled  . Pancreatitis Dx 2014  . Pneumonia 11/2014  . 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 Reflux Dx 2007  . 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 carpel tunnel release Right 07/28/2014    done in [place]   . ELBOW SURGERY    . KNEE ARTHROSCOPY    . KNEE SURGERY Left    15 years ago  . LIPOMA EXCISION N/A 05/10/2016   Procedure: EXCISION OF SCALP LIPOMA;  Surgeon: Johnathan Hausen, MD;  Location: [facility];  Service: General;  Laterality: N/A;    Family Psychiatric History:  Family History  Problem Relation Age of Onset  . Diabetes Father   . Hypertension Father   . Hypertension Paternal Grandfather   . Alcohol abuse Paternal Grandfather   . Alcohol abuse Maternal Grandfather   . Alcohol  abuse Maternal Grandmother   . Alcohol abuse Paternal Grandmother   . Colon cancer Neg Hx   . Rectal cancer Neg Hx   . Stomach cancer Neg Hx   . Bipolar disorder Neg Hx   . Depression Neg Hx     Social History:  Social History   Socioeconomic History  . Marital status: Legally Separated    Spouse name: Not on file  . Number of children: 2  . Years of education: 13 yrs  . Highest education level: Not on file  Occupational History  . Occupation: custodian    Comment: [employer]  Social Needs  . Financial resource strain: Not on file  . Food insecurity:    Worry: Not on file    Inability: Not on file  . Transportation needs:    Medical: Not on file    Non-medical: Not on file  Tobacco Use  . Smoking status: Never Smoker  . Smokeless tobacco: Never Used  Substance and Sexual Activity  . Alcohol use: Yes    Alcohol/week: 0.0 oz    Frequency: Never  . Drug use: Not Currently    Types: Marijuana    Comment: Patient denies   . Sexual activity: Not on file  Lifestyle  . Physical activity:    Days per week: Not on file    Minutes per session: Not on file  . Stress: Not on file  Relationships  . Social connections:  Talks on phone: Not on file    Gets together: Not on file    Attends religious service: Not on file    Active member of club or organization: Not on file    Attends meetings of clubs or organizations: Not on file    Relationship status: Not on file  Other Topics Concern  . Not on file  Social History Narrative   Born and raised in [place], [place] by both parents. He has 2 sisters and is the middle child. He remains close with family. He was married for 16 yrs and seperated in 2005. He has 2 boys who are in their 20's now. He lives alone in [place]. He is working as a Retail buyer    Allergies:  Allergies  Allergen Reactions  . Oxycodone Other (See Comments)    abd pain, stomach cramps     Metabolic Disorder Labs: Lab Results  Component Value Date    HGBA1C 6.0 10/22/2017   MPG 120 06/30/2017   No results found for: PROLACTIN Lab Results  Component Value Date   CHOL 330 (H) 06/30/2017   TRIG 61 06/30/2017   HDL 196 06/30/2017   CHOLHDL 1.7 06/30/2017   VLDL 12 06/30/2017   LDLCALC 122 (H) 06/30/2017   LDLCALC 128 (H) 12/15/2014   Lab Results  Component Value Date   TSH 0.985 06/30/2017   TSH 0.89 01/04/2017    Therapeutic Level Labs: No results found for: LITHIUM No results found for: VALPROATE No components found for:  CBMZ  Current Medications: Current Outpatient Medications  Medication Sig Dispense Refill  . chlordiazePOXIDE (LIBRIUM) 25 MG capsule 50mg  PO TID x 1D, then 25-50mg  PO BID X 1D, then 25-50mg  PO QD X 1D 10 capsule 0  . disulfiram (ANTABUSE) 250 MG tablet Take 1 tablet (250 mg total) by mouth 2 (two) times daily. 60 tablet 1  . escitalopram (LEXAPRO) 20 MG tablet Take 20 mg by mouth daily.    Marland Kitchen gabapentin (NEURONTIN) 100 MG capsule Take 1 capsule (100 mg total) by mouth 3 (three) times daily. 90 capsule 1  . omeprazole (PRILOSEC) 20 MG capsule Take 1 capsule (20 mg total) by mouth daily. (Patient taking differently: Take 20 mg by mouth as needed. ) 90 capsule 1  . pantoprazole (PROTONIX) 20 MG tablet Take 1 tablet (20 mg total) by mouth 2 (two) times daily. (Patient not taking: Reported on 03/29/2018) 30 tablet 0  . venlafaxine XR (EFFEXOR XR) 75 MG 24 hr capsule Take 1 capsule (75 mg total) by mouth daily. (Patient not taking: Reported on 03/29/2018) 30 capsule 1   No current facility-administered medications for this visit.      Musculoskeletal: Strength & Muscle Tone: {desc; muscle tone:32375} Gait & Station: {PE GAIT ED TGGY:69485} Patient leans: {Patient Leans:21022755}  Psychiatric Specialty Exam: ROS  There were no vitals taken for this visit.There is no height or weight on file to calculate BMI.  General Appearance: {Appearance:22683}  Eye Contact:  {BHH EYE CONTACT:22684}  Speech:   {Speech:22685}  Volume:  {Volume (PAA):22686}  Mood:  {BHH MOOD:22306}  Affect:  {Affect (PAA):22687}  Thought Process:  {Thought Process (PAA):22688}  Orientation:  {BHH ORIENTATION (PAA):22689}  Thought Content: {Thought Content:22690}   Suicidal Thoughts:  {ST/HT (PAA):22692}  Homicidal Thoughts:  {ST/HT (PAA):22692}  Memory:  {BHH IOEVOJ:50093}  Judgement:  {Judgement (PAA):22694}  Insight:  {Insight (PAA):22695}  Psychomotor Activity:  {Psychomotor (PAA):22696}  Concentration:  {Concentration:21399}  Recall:  {BHH GOOD/FAIR/POOR:22877}  Fund of Knowledge: {BHH GOOD/FAIR/POOR:22877}  Language: {BHH GOOD/FAIR/POOR:22877}  Akathisia:  {BHH YES OR NO:22294}  Handed:  {Handed:22697}  AIMS (if indicated): {Desc; done/not:10129}  Assets:  {Assets (PAA):22698}  ADL's:  {BHH RUE'A:54098}  Cognition: {chl bhh cognition:304700322}  Sleep:  {BHH GOOD/FAIR/POOR:22877}   Screenings: AIMS     Admission (Discharged) from 06/29/2017 in [facility]  AIMS Total Score  0    AUDIT     Admission (Discharged) from 06/29/2017 in [facility] Admission (Discharged) from 09/06/2014 in [facility] Admission (Discharged) from 06/01/2014 in [facility] Admission (Discharged) from 05/05/2014 in [facility] Admission (Discharged) from 03/16/2014 in [facility]  Alcohol Use Disorder Identification Test Final Score (AUDIT)  35  26  23  27  29     GAD-7     Office Visit from 12/19/2017 in [facility] Office Visit from 11/12/2017 in [facility] Office Visit from 10/22/2017 in [facility] Office Visit from 06/18/2017 in [facility] Office Visit from 03/26/2017 in [facility]  Total GAD-7 Score  6  9   16  9  1     PHQ2-9     Office Visit from 12/19/2017 in [facility] Office Visit from 11/12/2017 in [facility] Office Visit from 10/22/2017 in [facility] Office Visit from 06/18/2017 in [facility] Office Visit from 03/26/2017 in [facility]  PHQ-2 Total Score  0  0  0  3  0  PHQ-9 Total Score  0  4  9  11   0       Assessment and Plan: GAD; panic disorder by history; alcohol use disorder-mild    Medication management with supportive therapy. Risks and benefits, side effects and alternative treatment options discussed with patient. Pt was given an opportunity to ask questions about medication, illness, and treatment. All current psychiatric medications have been reviewed and discussed with the patient and adjusted as clinically appropriate. The patient has been provided an accurate and updated list of the medications being now prescribed. Patient expressed understanding of how their medications were to be used.  Pt verbalized understanding and verbal consent obtained for treatment.   Status of current problems: ***  Meds: Effexor XR 75 mg p.o. daily for GAD Neurontin 100 mg p.o. 3 times daily for off label treatment of GAD Antabuse 250 mg p.o. twice daily for alcohol cessation   Labs: ***  Therapy: brief supportive therapy provided. Discussed psychosocial stressors in detail.   Recommended pt stop all drug and alcohol use.  He has been thinking about trying to start [place] meetings***  Consultations: *** CDIOP Encouraged to follow up with therapist Encouraged to follow up with PCP as needed  ***Pt denies SI and is at an acute low risk for suicide. Patient told to call clinic if any problems occur. Patient advised to go to ER if they should develop SI/HI, side effects, or if symptoms worsen. Has crisis numbers to call if needed. Pt verbalized  understanding.  F/up in *** months or sooner if needed    Charlcie Cradle, MD 04/04/2018, 9:14 AM

## 2018-04-09 MED FILL — ?OMEPRAZOLE DR 20MG CAPSULE: 20 | 30 days supply | Qty: 30 | Fill #0

## 2018-04-09 MED FILL — CHLORDIAZEPOXIDE 25 MG CAP: 25 | 3 days supply | Qty: 10 | Fill #0

## 2018-04-16 ENCOUNTER — Other Ambulatory Visit: Payer: Self-pay

## 2018-04-16 ENCOUNTER — Encounter (HOSPITAL_COMMUNITY): Payer: Self-pay

## 2018-04-16 ENCOUNTER — Emergency Department (HOSPITAL_COMMUNITY)
Admission: EM | Admit: 2018-04-16 | Discharge: 2018-04-16 | Disposition: A | Discharge status: Home / Self Care | Payer: No Typology Code available for payment source | Attending: Emergency Medicine | Admitting: Emergency Medicine

## 2018-04-16 DIAGNOSIS — F10930 Alcohol use, unspecified with withdrawal, uncomplicated: Secondary | ICD-10-CM

## 2018-04-16 DIAGNOSIS — Z79899 Other long term (current) drug therapy: Secondary | ICD-10-CM | POA: Insufficient documentation

## 2018-04-16 DIAGNOSIS — F10230 Alcohol dependence with withdrawal, uncomplicated: Secondary | ICD-10-CM | POA: Insufficient documentation

## 2018-04-16 LAB — COMPREHENSIVE METABOLIC PANEL
ALT: 83 U/L — ABNORMAL HIGH (ref 17–63)
AST: 118 U/L — ABNORMAL HIGH (ref 15–41)
Albumin: 4.7 g/dL (ref 3.5–5.0)
Alkaline Phosphatase: 34 U/L — ABNORMAL LOW (ref 38–126)
Anion gap: 11 (ref 5–15)
BUN: 9 mg/dL (ref 6–20)
CO2: 28 mmol/L (ref 22–32)
Calcium: 9.5 mg/dL (ref 8.9–10.3)
Chloride: 98 mmol/L — ABNORMAL LOW (ref 101–111)
Creatinine, Ser: 0.98 mg/dL (ref 0.61–1.24)
GFR calc Af Amer: >60 mL/min (ref >60)
GFR calc non Af Amer: >60 mL/min (ref >60)
Glucose, Bld: 120 mg/dL — ABNORMAL HIGH (ref 65–99)
Potassium: 4.6 mmol/L (ref 3.5–5.1)
Sodium: 137 mmol/L (ref 135–145)
Total Bilirubin: 1 mg/dL (ref 0.3–1.2)
Total Protein: 8.9 g/dL — ABNORMAL HIGH (ref 6.5–8.1)

## 2018-04-16 LAB — CBC
HCT: 39.3 % (ref 39.0–52.0)
Hemoglobin: 12.6 g/dL — ABNORMAL LOW (ref 13.0–17.0)
MCH: 26 pg (ref 26.0–34.0)
MCHC: 32.1 g/dL (ref 30.0–36.0)
MCV: 81 fL (ref 78.0–100.0)
Platelets: 258 10*3/uL (ref 150–400)
RBC: 4.85 MIL/uL (ref 4.22–5.81)
RDW: 18.2 % — ABNORMAL HIGH (ref 11.5–15.5)
WBC: 4.5 10*3/uL (ref 4.0–10.5)

## 2018-04-16 LAB — RAPID URINE DRUG SCREEN, HOSP PERFORMED
Amphetamines: NOT DETECTED
Barbiturates: NOT DETECTED
Benzodiazepines: POSITIVE — AB
Cocaine: NOT DETECTED
Opiates: NOT DETECTED
Tetrahydrocannabinol: NOT DETECTED

## 2018-04-16 LAB — ETHANOL: Alcohol, Ethyl (B): <10 mg/dL (ref <10)

## 2018-04-16 MED ORDER — LORAZEPAM 1 MG PO TABS
2.0000 mg | ORAL_TABLET | Freq: Once | ORAL | Status: AC
  Administered 2018-04-16: 2 mg via ORAL
  Filled 2018-04-16: qty 2

## 2018-04-16 MED ORDER — LORAZEPAM 2 MG/ML IJ SOLN
2.0000 mg | Freq: Once | INTRAMUSCULAR | Status: AC
  Administered 2018-04-16: 2 mg via INTRAVENOUS
  Filled 2018-04-16: qty 1

## 2018-04-16 MED ORDER — ONDANSETRON HCL 4 MG/2ML IJ SOLN
4.0000 mg | Freq: Once | INTRAMUSCULAR | Status: AC
  Administered 2018-04-16: 4 mg via INTRAVENOUS
  Filled 2018-04-16: qty 2

## 2018-04-16 MED ORDER — VITAMIN B-1 100 MG PO TABS
100.0000 mg | ORAL_TABLET | Freq: Once | ORAL | Status: AC
  Administered 2018-04-16: 100 mg via ORAL
  Filled 2018-04-16: qty 1

## 2018-04-16 MED ORDER — ONDANSETRON 4 MG PO TBDP: ORAL_TABLET | ORAL | 0 refills | Status: DC

## 2018-04-16 MED ORDER — CHLORDIAZEPOXIDE HCL 25 MG PO CAPS: ORAL_CAPSULE | ORAL | 0 refills | Status: DC

## 2018-04-16 MED ORDER — FOLIC ACID 1 MG PO TABS
1.0000 mg | ORAL_TABLET | Freq: Once | ORAL | Status: AC
  Administered 2018-04-16: 1 mg via ORAL
  Filled 2018-04-16: qty 1

## 2018-04-16 MED ORDER — SODIUM CHLORIDE 0.9 % IV BOLUS
1000.0000 mL | Freq: Once | INTRAVENOUS | Status: AC
  Administered 2018-04-16: 1000 mL via INTRAVENOUS

## 2018-04-16 NOTE — Patient Outreach (Signed)
ED Peer Support Specialist Patient Intake (Complete at intake & 30-60 Day Follow-up)  Name: [name] [name]  MRN: [id number]  Age: 57 y.o.   Date of Admission: 04/16/2018  Intake: Initial Comments:      Primary Reason Admitted: Alcohol related trimmers, withdrawal symptoms.  Lab values: Alcohol/ETOH: Positive Positive UDS? No Amphetamines: No Barbiturates: No Benzodiazepines: Yes Cocaine: No Opiates: No Cannabinoids: No  Demographic information: Gender: Male Ethnicity: African American Marital Status: Separated Insurance Status: ([place] card ) Ecologist (Work Neurosurgeon, Physicist, medical, etc.: Yes(Food Public librarian) Lives with: Alone Living situation: Homeless  Reported Patient History: Patient reported health conditions: Anxiety disorders Patient aware of HIV and hepatitis status: No  In past year, has patient visited ED for any reason? Yes  Number of ED visits: 9  Reason(s) for visit: various reasons  In past year, has patient been hospitalized for any reason? Yes  Number of hospitalizations:    Reason(s) for hospitalization:    In past year, has patient been arrested? No  Number of arrests:    Reason(s) for arrest:    In past year, has patient been incarcerated? No  Number of incarcerations:    Reason(s) for incarceration:    In past year, has patient received medication-assisted treatment? No  In past year, patient received the following treatments: Residential treatment (non-hospital)  In past year, has patient received any harm reduction services? No  Did this include any of the following?    In past year, has patient received care from a mental health provider for diagnosis other than SUD? No  In past year, is this first time patient has overdosed? No  Number of past overdoses:    In past year, is this first time patient has been hospitalized for an overdose? No  Number of hospitalizations for overdose(s):    Is patient  currently receiving treatment for a mental health diagnosis? No  Patient reports experiencing difficulty participating in SUD treatment: No    Most important reason(s) for this difficulty?    Has patient received prior services for treatment? No  In past, patient has received services from following agencies:    Plan of Care:  Suggested follow up at these agencies/treatment centers: [facility]), [facility])  Other information: CPSS Jenny Reichmann and Aaron Edelman were able to speak with Pt to speak with Pt and complete the series of questions. CPSS was able to ask the questions to understand what services Pt wanted at the time. CPSS was made aware that Pt was not going to be able to attend a inpatient facility because of the fact that he had just received a new job an he does not want to leave it at this time. CPSS John addressed a few options that maybe suitable for Pt at this time. CPSS offered several facility and AA services that Pt can look into utilizing at the time. CPSS discussed a few options but need to contact the facility to see if they will take his insurance. CPSS was left contact information for Pt to stay in contact with CPSS John an myself, to also assist with services in the community.     Aaron Edelman [place], [place]  04/16/2018 4:40 PM

## 2018-04-16 NOTE — ED Provider Notes (Signed)
[facility] DEPT Provider Note   CSN: [id number] Arrival date & time: 04/16/18  1210     History   Chief Complaint Chief Complaint  Patient presents with  . Delirium Tremens (DTS)  . Abdominal Pain    HPI [name] [name] is a 57 y.o. male.  [name] [name] is a 57 y.o. Male with a history of alcohol abuse, anxiety, GERD, hyperlipidemia, sarcoidosis and depression, presents to the emergency department for evaluation of alcohol withdrawal.  Patient complaining of weakness, tremors, epigastric abdominal pain, nausea and decreased appetite.  Patient re patient reports he recently got a new job ports he typically drinks of around 3 40s of alcohol daily, and his last drink was sometime yesterday afternoon, a and continue to work.  Patient reports since last night nd wants to stop drinking, he cannot do inpatient treatment he is been having worsening tremors in both arms, he feels weak, fatigued, and nauseated.  Patient reports 2 episodes of vomiting yesterday, but none today but he has very little appetite.  Patient reports a few episodes of palpitations, but denies chest pain or shortness of breath.  He has mild epigastric abdominal pain denies any diarrhea or blood in the stool.  Patient has gone through detox in the past, and has had to be hospitalized due to DTs, feels his symptoms are not as severe today as they have been in the past.  Patient denies any hallucinations, no seizure activity.     Past Medical History:  Diagnosis Date  . Alcohol abuse    Last Drink on Sep 2018  . Anxiety    About 57 years of age  . Carpal tunnel syndrome of right wrist Dx 2015  . Depression    At 57 years of age  . GERD (gastroesophageal reflux disease) Dx 2007  . Hepatitis, alcoholic, acute 5852  . Hyperlipidemia    borderline, diet controlled  . Pancreatitis Dx 2014  . Pneumonia 11/2014  . 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 Reflux Dx 2007  .  Sarcoidosis of lung [facility]) Dx 1989    Patient Active Problem List   Diagnosis Date Noted  . 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 Lateral epicondylitis of right elbow 01/05/2017  . Strain, MCP, hand, right, initial encounter 01/05/2017  . 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 Bilateral knee pain 01/11/2016  . Chronic pain of right wrist 07/07/2015  . Carpal tunnel syndrome of right wrist 05/20/2015  . Dislocation of right shoulder joint 05/20/2015  . Tear of medial meniscus of right knee 03/24/2015  . 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 Chronic cough 01/15/2015  . Onychomycosis of toenail 12/15/2014  . Seborrhea 11/11/2014  . GERD (gastroesophageal reflux disease)   . Alcohol abuse 01/19/2014  . Alcohol dependence with unspecified alcohol-induced disorder ([place]) 09/02/2013  . Generalized anxiety disorder 09/02/2013    Past Surgical History:  Procedure Laterality Date  . carpel tunnel release Right 07/28/2014    done in [place]   . ELBOW SURGERY    . KNEE ARTHROSCOPY    . KNEE SURGERY Left    15 years ago  . LIPOMA EXCISION N/A 05/10/2016   Procedure: EXCISION OF SCALP LIPOMA;  Surgeon: Johnathan Hausen, MD;  Location: [facility];  Service: General;  Laterality: N/A;        Home Medications    Prior to Admission medications   Medication Sig Start Date End Date Taking? Authorizing Provider  chlordiazePOXIDE (LIBRIUM) 25 MG capsule  50mg  PO TID x 1D, then 25-50mg  PO BID X 1D, then 25-50mg  PO QD X 1D 03/29/18   Virgel Manifold, MD  disulfiram (ANTABUSE) 250 MG tablet Take 1 tablet (250 mg total) by mouth 2 (two) times daily. 02/07/18   Charlcie Cradle, MD  escitalopram (LEXAPRO) 20 MG tablet Take 20 mg by mouth daily.    [provider]  gabapentin (NEURONTIN) 100 MG capsule Take 1 capsule (100 mg total) by mouth 3 (three) times daily. 02/07/18 02/07/19  Charlcie Cradle, MD  omeprazole (PRILOSEC) 20 MG capsule Take 1 capsule (20 mg total) by mouth  daily. Patient taking differently: Take 20 mg by mouth as needed.  02/06/18 05/07/18  Gildardo Pounds, NP  pantoprazole (PROTONIX) 20 MG tablet Take 1 tablet (20 mg total) by mouth 2 (two) times daily. Patient not taking: Reported on 03/29/2018 02/27/18   Virgel Manifold, MD  venlafaxine XR (EFFEXOR XR) 75 MG 24 hr capsule Take 1 capsule (75 mg total) by mouth daily. Patient not taking: Reported on 03/29/2018 02/07/18 02/07/19  Charlcie Cradle, MD    Family History Family History  Problem Relation Age of Onset  . Diabetes Father   . Hypertension Father   . Hypertension Paternal Grandfather   . Alcohol abuse Paternal Grandfather   . Alcohol abuse Maternal Grandfather   . Alcohol abuse Maternal Grandmother   . Alcohol abuse Paternal Grandmother   . Colon cancer Neg Hx   . Rectal cancer Neg Hx   . Stomach cancer Neg Hx   . Bipolar disorder Neg Hx   . Depression Neg Hx     Social History Social History   Tobacco Use  . Smoking status: Never Smoker  . Smokeless tobacco: Never Used  Substance Use Topics  . Alcohol use: Yes    Alcohol/week: 0.0 oz    Frequency: Never  . Drug use: Not Currently    Types: Marijuana    Comment: Patient denies      Allergies   Oxycodone   Review of Systems Review of Systems  Constitutional: Positive for appetite change and fatigue. Negative for chills and fever.  HENT: Negative.   Eyes: Negative for visual disturbance.  Respiratory: Negative for cough and shortness of breath.   Cardiovascular: Positive for palpitations. Negative for chest pain and leg swelling.  Gastrointestinal: Positive for abdominal pain, nausea and vomiting. Negative for blood in stool, constipation and diarrhea.  Genitourinary: Negative for dysuria and frequency.  Musculoskeletal: Negative for arthralgias and myalgias.  Skin: Negative for color change and rash.  Neurological: Positive for tremors and weakness. Negative for seizures and numbness.     Physical Exam Updated  Vital Signs BP (!) 128/91   Pulse 99   Temp 98.8 F (37.1 C) (Oral)   Resp 18   Ht 5\' 9"  (1.753 m)   Wt 59 kg (130 lb)   SpO2 99%   BMI 19.20 kg/m   Physical Exam  Constitutional: He appears well-developed and well-nourished. No distress.  Patient is slightly tremulous, although appears calm and in no acute distress  HENT:  Head: Normocephalic and atraumatic.  Mouth/Throat: Oropharynx is clear and moist.  Eyes: Pupils are equal, round, and reactive to light. EOM are normal. Right eye exhibits no discharge. Left eye exhibits no discharge.  Cardiovascular: Normal rate, regular rhythm, normal heart sounds and intact distal pulses.  Pulmonary/Chest: Effort normal and breath sounds normal. No stridor. No respiratory distress. He has no wheezes. He has no rhonchi. He has no  rales.  Respirations equal and unlabored, patient able to speak in full sentences, lungs clear to auscultation bilaterally  Abdominal: Soft. Normal appearance and bowel sounds are normal. There is tenderness in the epigastric area. There is no rigidity, no rebound, no guarding and no CVA tenderness.  Abdomen is soft, nondistended, bowel sounds present throughout, there is mild epigastric tenderness without guarding, all other quadrants nontender to palpation, no peritoneal signs, no CVA tenderness  Neurological: He is alert. Coordination normal.  Skin: Skin is warm and dry. Capillary refill takes less than 2 seconds. He is not diaphoretic.  Psychiatric: He has a normal mood and affect. His behavior is normal.  Nursing note and vitals reviewed.    ED Treatments / Results  Labs (all labs ordered are listed, but only abnormal results are displayed) Labs Reviewed  COMPREHENSIVE METABOLIC PANEL  ETHANOL  CBC  RAPID URINE DRUG SCREEN, HOSP PERFORMED    EKG None  Radiology No results found.  Procedures Procedures (including critical care time)  Medications Ordered in ED Medications - No data to  display   Initial Impression / Assessment and Plan / ED Course  I have reviewed the triage vital signs and the nursing notes.  Pertinent labs & imaging results that were available during my care of the patient were reviewed by me and considered in my medical decision making (see chart for details).  Patient presents to the ED for evaluation of alcohol withdrawal, last drink yesterday afternoon, patient reports tremulousness, nausea, vomiting, generalized weakness and fatigue and poor appetite, no hallucinations or seizures.  On arrival patient is mildly tachycardic and hypertensive, but appears to be in no acute distress.  Initial CIWA assessment is 13.  Will plan to treat with IV fluids, folic acid, thiamine and 2 mg of Ativan and then reassess CIWA score.  Will get labs and alcohol level.  Alcohol level is 0, no acute electrolyte derangements requiring intervention, normal renal function, LFTs slightly elevated but appear improved compared to most recent values.  No leukocytosis, hemoglobin stable.  After fluids and Ativan CIWA score is now 4.  Peer counselor support consulted and spoke with patient's providing outpatient resources.  Patient will remain in contact with them for continued support.  Will give 1 additional dose of benzos to feel patient is an excellent candidate for outpatient Librium taper.  Patient appears serious about detoxing and stopping drinking, and has motivation with new job.  Patient expresses concern about affording medications as he does not get paid until the month, spoke with Mariann Laster with case management who will fax over medication financial assistance vouchers.  This time patient appears stable for discharge home.  Vitals normal, patient tolerating p.o. here in the ED and CIWA score continues to improve.  Strict return precautions discussed.  Patient will be discharged with Librium taper, discussed with patient that if he drinks alcohol while on this medication it can be  life-threatening.  He expresses understanding and agreement with plan.  He will follow-up with the [facility] and use the provided outpatient resources.  Final Clinical Impressions(s) / ED Diagnoses   Final diagnoses:  Alcohol withdrawal syndrome without complication [facility])    ED Discharge Orders        Ordered    chlordiazePOXIDE (LIBRIUM) 25 MG capsule     04/16/18 1912    ondansetron (ZOFRAN ODT) 4 MG disintegrating tablet     04/16/18 1912       Jacqlyn Larsen, [place]  04/17/18 0133    Tanna Furry, MD 04/20/18 1153

## 2018-04-16 NOTE — ED Notes (Signed)
ED Provider at bedside. 

## 2018-04-16 NOTE — ED Notes (Signed)
Bed: [facility] Expected date:  Expected time:  Means of arrival:  Comments: Held

## 2018-04-16 NOTE — Discharge Instructions (Signed)
Please take Librium taper as directed, you may use Zofran as needed for nausea.  While using these medications is extremely important that you do not drink alcohol as this can be life-threatening and cause you to stop breathing.  Please use the resources provided and continue to stay in touch with the peer support counselor.  Follow-up with the [facility] community health and wellness clinic.  Return to the emergency department for worsening tremors, nausea vomiting, fevers, hallucinations, seizure activity, palpitations or chest pain or any other new or concerning symptoms.

## 2018-04-16 NOTE — ED Triage Notes (Signed)
Pt arrives today c/o weakness, abd pain, decreased appetite, tremors, nausea, and mid epigastric pain. Pt reports normal EPTH intake is around 3 40's/daily. Pt's last drink was sometime yesterday.

## 2018-04-17 MED FILL — CHLORDIAZEPOXIDE 25 MG CAP: 25 | 3 days supply | Qty: 10 | Fill #0

## 2018-04-17 MED FILL — ONDANSETRON ODT 4 MG TABLET: 4 | 3 days supply | Qty: 10 | Fill #0

## 2018-05-03 ENCOUNTER — Other Ambulatory Visit: Payer: Self-pay

## 2018-05-03 ENCOUNTER — Emergency Department (HOSPITAL_COMMUNITY)
Admission: EM | Admit: 2018-05-03 | Discharge: 2018-05-04 | Disposition: A | Discharge status: Other Acute Inpatient Hospital | Payer: No Typology Code available for payment source | Attending: Emergency Medicine | Admitting: Emergency Medicine

## 2018-05-03 ENCOUNTER — Encounter (HOSPITAL_COMMUNITY): Payer: Self-pay

## 2018-05-03 DIAGNOSIS — R45851 Suicidal ideations: Secondary | ICD-10-CM | POA: Insufficient documentation

## 2018-05-03 DIAGNOSIS — Z590 Homelessness: Secondary | ICD-10-CM | POA: Insufficient documentation

## 2018-05-03 DIAGNOSIS — F332 Major depressive disorder, recurrent severe without psychotic features: Secondary | ICD-10-CM | POA: Insufficient documentation

## 2018-05-03 DIAGNOSIS — Z79899 Other long term (current) drug therapy: Secondary | ICD-10-CM | POA: Insufficient documentation

## 2018-05-03 DIAGNOSIS — F1020 Alcohol dependence, uncomplicated: Secondary | ICD-10-CM

## 2018-05-03 LAB — COMPREHENSIVE METABOLIC PANEL
ALT: 25 U/L (ref 17–63)
AST: 59 U/L — ABNORMAL HIGH (ref 15–41)
Albumin: 4.1 g/dL (ref 3.5–5.0)
Alkaline Phosphatase: 44 U/L (ref 38–126)
Anion gap: 14 (ref 5–15)
BUN: 8 mg/dL (ref 6–20)
CO2: 27 mmol/L (ref 22–32)
Calcium: 8.9 mg/dL (ref 8.9–10.3)
Chloride: 97 mmol/L — ABNORMAL LOW (ref 101–111)
Creatinine, Ser: 1.11 mg/dL (ref 0.61–1.24)
GFR calc Af Amer: >60 mL/min (ref >60)
GFR calc non Af Amer: >60 mL/min (ref >60)
Glucose, Bld: 113 mg/dL — ABNORMAL HIGH (ref 65–99)
Potassium: 4.1 mmol/L (ref 3.5–5.1)
Sodium: 138 mmol/L (ref 135–145)
Total Bilirubin: 0.7 mg/dL (ref 0.3–1.2)
Total Protein: 7.5 g/dL (ref 6.5–8.1)

## 2018-05-03 LAB — CBG MONITORING, ED: Glucose-Capillary: 111 mg/dL — ABNORMAL HIGH (ref 65–99)

## 2018-05-03 LAB — RAPID URINE DRUG SCREEN, HOSP PERFORMED
Amphetamines: NOT DETECTED
Barbiturates: NOT DETECTED
Benzodiazepines: POSITIVE — AB
Cocaine: NOT DETECTED
Opiates: NOT DETECTED
Tetrahydrocannabinol: NOT DETECTED

## 2018-05-03 LAB — LIPASE, BLOOD: Lipase: 69 U/L — ABNORMAL HIGH (ref 11–51)

## 2018-05-03 LAB — URINALYSIS, ROUTINE W REFLEX MICROSCOPIC
Bilirubin Urine: NEGATIVE
Glucose, UA: NEGATIVE mg/dL
Hgb urine dipstick: NEGATIVE
Ketones, ur: NEGATIVE mg/dL
Leukocytes, UA: NEGATIVE
Nitrite: NEGATIVE
Protein, ur: NEGATIVE mg/dL
Specific Gravity, Urine: 1.004 — ABNORMAL LOW (ref 1.005–1.030)
pH: 5 (ref 5.0–8.0)

## 2018-05-03 LAB — CBC
HCT: 39.5 % (ref 39.0–52.0)
Hemoglobin: 12.4 g/dL — ABNORMAL LOW (ref 13.0–17.0)
MCH: 25.9 pg — ABNORMAL LOW (ref 26.0–34.0)
MCHC: 31.4 g/dL (ref 30.0–36.0)
MCV: 82.6 fL (ref 78.0–100.0)
Platelets: 244 10*3/uL (ref 150–400)
RBC: 4.78 MIL/uL (ref 4.22–5.81)
RDW: 17.8 % — ABNORMAL HIGH (ref 11.5–15.5)
WBC: 5.4 10*3/uL (ref 4.0–10.5)

## 2018-05-03 LAB — ETHANOL: Alcohol, Ethyl (B): 367 mg/dL (ref <10)

## 2018-05-03 LAB — SALICYLATE LEVEL: Salicylate Lvl: <7 mg/dL (ref 2.8–30.0)

## 2018-05-03 LAB — ACETAMINOPHEN LEVEL: Acetaminophen (Tylenol), Serum: <10 ug/mL — ABNORMAL LOW (ref 10–30)

## 2018-05-03 LAB — PROTIME-INR
INR: 1.05
Prothrombin Time: 13.6 seconds (ref 11.4–15.2)

## 2018-05-03 MED ORDER — LORAZEPAM 1 MG PO TABS: 0.0000 mg | ORAL_TABLET | Freq: Two times a day (BID) | ORAL | Status: DC

## 2018-05-03 MED ORDER — LORAZEPAM 1 MG PO TABS
0.0000 mg | ORAL_TABLET | Freq: Four times a day (QID) | ORAL | Status: DC
  Administered 2018-05-03 – 2018-05-04 (×2): 1 mg via ORAL
  Filled 2018-05-03 (×2): qty 1

## 2018-05-03 MED ORDER — THIAMINE HCL 100 MG/ML IJ SOLN: 100.0000 mg | Freq: Every day | INTRAMUSCULAR | Status: DC

## 2018-05-03 MED ORDER — GI COCKTAIL ~~LOC~~: 30.0000 mL | Freq: Once | ORAL | Status: DC

## 2018-05-03 MED ORDER — LORAZEPAM 2 MG/ML IJ SOLN: 0.0000 mg | Freq: Two times a day (BID) | INTRAMUSCULAR | Status: DC

## 2018-05-03 MED ORDER — ACETAMINOPHEN 325 MG PO TABS: 650.0000 mg | ORAL_TABLET | ORAL | Status: DC | PRN

## 2018-05-03 MED ORDER — VITAMIN B-1 100 MG PO TABS
100.0000 mg | ORAL_TABLET | Freq: Every day | ORAL | Status: DC
  Administered 2018-05-03 – 2018-05-04 (×2): 100 mg via ORAL
  Filled 2018-05-03 (×2): qty 1

## 2018-05-03 MED ORDER — PANTOPRAZOLE SODIUM 20 MG PO TBEC
20.0000 mg | DELAYED_RELEASE_TABLET | Freq: Two times a day (BID) | ORAL | Status: DC
  Administered 2018-05-03 – 2018-05-04 (×2): 20 mg via ORAL
  Filled 2018-05-03 (×2): qty 1

## 2018-05-03 MED ORDER — LORAZEPAM 2 MG/ML IJ SOLN: 0.0000 mg | Freq: Four times a day (QID) | INTRAMUSCULAR | Status: DC

## 2018-05-03 NOTE — ED Provider Notes (Signed)
[facility] EMERGENCY DEPARTMENT Provider Note   CSN: [id number] Arrival date & time: 05/03/18  1457     History   Chief Complaint Chief Complaint  Patient presents with  . Suicidal  . Hematuria    HPI [name] [name] is a 57 y.o. male.  HPI   Patient is a 57 year old male presenting with suicidality.  [name] [name] reports that [name] [name] wishes to kill himself.  [name] [name] reports [name] [name] drinks a lot.  [name] [name] has mild epigastric pain.  Reports sometimes [name] [name] has blood sometimes [name] [name] does not.  Patient has normal vital signs, and baseline hemoglobin did not feel necessary to pursue this complaint at this time.  Patient is very  elevated alcohol.  Will have [facility] evaluate patient.   Past Medical History:  Diagnosis Date  . Alcohol abuse    Last Drink on Sep 2018  . Anxiety    About 57 years of age  . Carpal tunnel syndrome of right wrist Dx 2015  . Depression    At 57 years of age  . GERD (gastroesophageal reflux disease) Dx 2007  . Hepatitis, alcoholic, acute 9326  . Hyperlipidemia    borderline, diet controlled  . Pancreatitis Dx 2014  . Pneumonia 11/2014  . 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 Reflux Dx 2007  . 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 Severe major depression ([place]) 06/30/2017  . Lateral epicondylitis of right elbow 01/05/2017  . Strain, MCP, hand, right, initial encounter 01/05/2017  . 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 Bilateral knee pain 01/11/2016  . Chronic pain of right wrist 07/07/2015  . Carpal tunnel syndrome of right wrist 05/20/2015  . Dislocation of right shoulder joint 05/20/2015  . Tear of medial meniscus of right knee 03/24/2015  . 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 Chronic cough 01/15/2015  . Onychomycosis of toenail 12/15/2014  . Seborrhea 11/11/2014  . GERD (gastroesophageal reflux disease)   . Alcohol abuse 01/19/2014  . Alcohol dependence with unspecified alcohol-induced  disorder ([place]) 09/02/2013  . Generalized anxiety disorder 09/02/2013    Past Surgical History:  Procedure Laterality Date  . carpel tunnel release Right 07/28/2014    done in [place]   . ELBOW SURGERY    . KNEE ARTHROSCOPY    . KNEE SURGERY Left    15 years ago  . LIPOMA EXCISION N/A 05/10/2016   Procedure: EXCISION OF SCALP LIPOMA;  Surgeon: Johnathan Hausen, MD;  Location: [place];  Service: General;  Laterality: N/A;        Home Medications    Prior to Admission medications   Medication Sig Start Date End Date Taking? Authorizing Provider  chlordiazePOXIDE (LIBRIUM) 25 MG capsule 50mg  PO TID x 1D, then 25-50mg  PO BID X 1D, then 25-50mg  PO QD X 1D Patient not taking: Reported on 05/03/2018 04/16/18   Jacqlyn Larsen, PA-C  disulfiram (ANTABUSE) 250 MG tablet Take 1 tablet (250 mg total) by mouth 2 (two) times daily. Patient not taking: Reported on 04/16/2018 02/07/18   Charlcie Cradle, MD  gabapentin (NEURONTIN) 100 MG capsule Take 1 capsule (100 mg total) by mouth 3 (three) times daily. Patient not taking: Reported on 05/03/2018 02/07/18 02/07/19  Charlcie Cradle, MD  omeprazole (PRILOSEC) 20 MG capsule Take 1 capsule (20 mg total) by mouth daily. Patient not taking: Reported on 05/03/2018 02/06/18 05/07/18  Gildardo Pounds, NP  ondansetron (ZOFRAN ODT) 4 MG disintegrating  tablet 4mg  ODT q4 hours prn nausea/vomit Patient not taking: Reported on 05/03/2018 04/16/18   Jacqlyn Larsen, PA-C  pantoprazole (PROTONIX) 20 MG tablet Take 1 tablet (20 mg total) by mouth 2 (two) times daily. Patient not taking: Reported on 03/29/2018 02/27/18   Virgel Manifold, MD  venlafaxine XR (EFFEXOR XR) 75 MG 24 hr capsule Take 1 capsule (75 mg total) by mouth daily. Patient not taking: Reported on 03/29/2018 02/07/18 02/07/19  Charlcie Cradle, MD    Family History Family History  Problem Relation Age of Onset  . Diabetes Father   . Hypertension Father   . Hypertension Paternal Grandfather   .  Alcohol abuse Paternal Grandfather   . Alcohol abuse Maternal Grandfather   . Alcohol abuse Maternal Grandmother   . Alcohol abuse Paternal Grandmother   . Colon cancer Neg Hx   . Rectal cancer Neg Hx   . Stomach cancer Neg Hx   . Bipolar disorder Neg Hx   . Depression Neg Hx     Social History Social History   Tobacco Use  . Smoking status: Never Smoker  . Smokeless tobacco: Never Used  Substance Use Topics  . Alcohol use: Yes    Alcohol/week: 0.0 oz    Frequency: Never    Comment: last use today 05/03/18   . Drug use: Not Currently    Types: Marijuana    Comment: Patient denies      Allergies   Oxycodone   Review of Systems Review of Systems  Constitutional: Positive for fatigue. Negative for activity change.  Respiratory: Negative for shortness of breath.   Cardiovascular: Negative for chest pain.  Gastrointestinal: Negative for abdominal pain.  Psychiatric/Behavioral: Positive for suicidal ideas. The patient is nervous/anxious.   All other systems reviewed and are negative.    Physical Exam Updated Vital Signs BP 125/79   Pulse 78   Temp 98.1 F (36.7 C) (Oral)   Resp 16   Ht 5\' 9"  (1.753 m)   Wt 59 kg (130 lb)   SpO2 96%   BMI 19.20 kg/m   Physical Exam  Constitutional: [name] [name] is oriented to person, place, and time. [name] [name] appears well-nourished.  HENT:  Head: Normocephalic.  Eyes: Conjunctivae are normal. Right eye exhibits no discharge. Left eye exhibits no discharge.  Cardiovascular: Normal rate and regular rhythm.  No murmur heard. Pulmonary/Chest: Effort normal and breath sounds normal. No respiratory distress.  Abdominal: Soft. [name] [name] exhibits no distension. There is tenderness.  Small amoutn of epigastric pain  Neurological: [name] [name] is oriented to person, place, and time.  Skin: Skin is warm and dry. [name] [name] is not diaphoretic.  Psychiatric:  SI      ED Treatments / Results  Labs (all labs ordered are listed, but only abnormal results are  displayed) Labs Reviewed  COMPREHENSIVE METABOLIC PANEL - Abnormal; Notable for the following components:      Result Value   Chloride 97 (*)    Glucose, Bld 113 (*)    AST 59 (*)    All other components within normal limits  ETHANOL - Abnormal; Notable for the following components:   Alcohol, Ethyl (B) 367 (*)    All other components within normal limits  ACETAMINOPHEN LEVEL - Abnormal; Notable for the following components:   Acetaminophen (Tylenol), Serum <10 (*)    All other components within normal limits  CBC - Abnormal; Notable for the following components:   Hemoglobin 12.4 (*)    MCH 25.9 (*)    RDW  17.8 (*)    All other components within normal limits  RAPID URINE DRUG SCREEN, HOSP PERFORMED - Abnormal; Notable for the following components:   Benzodiazepines POSITIVE (*)    All other components within normal limits  URINALYSIS, ROUTINE W REFLEX MICROSCOPIC - Abnormal; Notable for the following components:   Color, Urine STRAW (*)    Specific Gravity, Urine 1.004 (*)    All other components within normal limits  LIPASE, BLOOD - Abnormal; Notable for the following components:   Lipase 69 (*)    All other components within normal limits  CBG MONITORING, ED - Abnormal; Notable for the following components:   Glucose-Capillary 111 (*)    All other components within normal limits  SALICYLATE LEVEL  PROTIME-INR    EKG None  Radiology No results found.  Procedures Procedures (including critical care time)  Medications Ordered in ED Medications  gi cocktail (Maalox,Lidocaine,Donnatal) (has no administration in time range)     Initial Impression / Assessment and Plan / ED Course  I have reviewed the triage vital signs and the nursing notes.  Pertinent labs & imaging results that were available during my care of the patient were reviewed by me and considered in my medical decision making (see chart for details).     Patient is a 57 year old male presenting with  suicidality.  [name] [name] reports that [name] [name] wishes to kill himself.  [name] [name] reports [name] [name] drinks a lot.  [name] [name] has mild epigastric pain.  Reports sometimes [name] [name] has blood sometimes [name] [name] does not.  Patient has normal vital signs, and baseline hemoglobin did not feel necessary to pursue this complaint at this time.  Patient is very  elevated alcohol.  Will have [facility] evaluate patient.   6:06 PM Patient remains having normal vital signs.  When I interviewed him [name] [name] asked "when can I have something eat.".  We will give him food, have him be interviewed by [facility] for his suicidality.  Final Clinical Impressions(s) / ED Diagnoses   Final diagnoses:  Suicidal ideation    ED Discharge Orders    None       Macarthur Critchley, MD 05/03/18 1807

## 2018-05-03 NOTE — ED Triage Notes (Signed)
Pt endorses suicidal thoughts x 2 weeks and blood in urine. Pt suffers from anxiety but states "i've never had these thoughts of ending my life" No plan. VSS.

## 2018-05-03 NOTE — ED Notes (Signed)
TTS in process-[name],RN  

## 2018-05-03 NOTE — BH Assessment (Addendum)
Tele Assessment Note   Patient Name: [name] [name] MRN: [id number] Referring Physician: Macarthur Critchley, MD Location of Patient: [facility] Location of Provider: [place] Department  Helio Lack is an 57 y.o. male who presents to the ED voluntarily. Pt reports he has been increasingly depressed, hopeless, and contemplating suicide. Pt stated "if I had a gun, I would not be here." Pt states he is homeless and sleeps in the woods. Pt states he has been drinking alcohol in excess for years and he has no support. Pt states he would rather die than to continue living this way. Pt has attended [facility] in the past for SA treatment and states he was able to stay sober for 1 year after SA treatment. Pt states he feels like God is punishing him and he continues to ask ""why am I alive, what is the point, why am I going through all this hell?" Pt's current BAL is 367. Pt has a hx of ED visits c/o alcohol dependence. Pt's most recent TTS assessment occurred on 03/29/18 c/o similar concerns related to alcohol abuse and hopelessness. Pt states he does not have a current provider and has no family support. Pt is unable to contract for safety at this time and states if he is d/c he will try to harm himself.   TTS consulted with Patriciaann Clan, PA who recommends continued observation for safety and stabilization and to be reassessed in the AM by psych. EDP Mackuen, Fredia Sorrow, MD and pt's nurse Staci Acosta, RN have been advised.   Diagnosis: MDD, recurrent, severe, w/o psychosis; Alcohol use disorder, severe   Past Medical History:  Past Medical History:  Diagnosis Date  . Alcohol abuse    Last Drink on Sep 2018  . Anxiety    About 57 years of age  . Carpal tunnel syndrome of right wrist Dx 2015  . Depression    At 57 years of age  . GERD (gastroesophageal reflux disease) Dx 2007  . Hepatitis, alcoholic, acute 4709  . Hyperlipidemia    borderline, diet controlled  . Pancreatitis  Dx 2014  . Pneumonia 11/2014  . 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 Reflux Dx 2007  . Sarcoidosis of lung ([place]) Dx 1989    Past Surgical History:  Procedure Laterality Date  . carpel tunnel release Right 07/28/2014    done in [place]   . ELBOW SURGERY    . KNEE ARTHROSCOPY    . KNEE SURGERY Left    15 years ago  . LIPOMA EXCISION N/A 05/10/2016   Procedure: EXCISION OF SCALP LIPOMA;  Surgeon: Johnathan Hausen, MD;  Location: [place];  Service: General;  Laterality: N/A;    Family History:  Family History  Problem Relation Age of Onset  . Diabetes Father   . Hypertension Father   . Hypertension Paternal Grandfather   . Alcohol abuse Paternal Grandfather   . Alcohol abuse Maternal Grandfather   . Alcohol abuse Maternal Grandmother   . Alcohol abuse Paternal Grandmother   . Colon cancer Neg Hx   . Rectal cancer Neg Hx   . Stomach cancer Neg Hx   . Bipolar disorder Neg Hx   . Depression Neg Hx     Social History:  reports that he has never smoked. He has never used smokeless tobacco. He reports that he drinks alcohol. He reports that he has current or past drug history. Drug: Marijuana.  Additional Social History:  Alcohol / Drug  Use Pain Medications: See MAR Prescriptions: See MAR Over the Counter: See MAR History of alcohol / drug use?: Yes Substance #1 Name of Substance 1: Alcohol 1 - Age of First Use: 15 1 - Amount (size/oz): BAL 367 1 - Frequency: daily 1 - Duration: ongoing 1 - Last Use / Amount: 05/03/18  CIWA: CIWA-Ar BP: 125/79 Pulse Rate: 78 Nausea and Vomiting: no nausea and no vomiting Tactile Disturbances: none Tremor: no tremor Auditory Disturbances: not present Paroxysmal Sweats: no sweat visible Visual Disturbances: not present Anxiety: two Headache, Fullness in Head: none present Agitation: somewhat more than normal activity Orientation and Clouding of Sensorium: oriented and can do serial  additions CIWA-Ar Total: 3 COWS:    Allergies:  Allergies  Allergen Reactions  . Oxycodone Other (See Comments)    abd pain, stomach cramps     Home Medications:  (Not in a hospital admission)  OB/GYN Status:  No LMP for male patient.  General Assessment Data Location of Assessment: [facility] ED TTS Assessment: In system Is this a Tele or Face-to-Face Assessment?: Tele Assessment Is this an Initial Assessment or a Re-assessment for this encounter?: Initial Assessment Marital status: Separated Is patient pregnant?: No Pregnancy Status: No Living Arrangements: Other (Comment)(homeless) Can pt return to current living arrangement?: Yes Admission Status: Voluntary Is patient capable of signing voluntary admission?: Yes Referral Source: Self/Family/Friend Insurance type: none     Crisis Care Plan Living Arrangements: Other (Comment)(homeless) Name of Psychiatrist: none Name of Therapist: none  Education Status Is patient currently in school?: No Is the patient employed, unemployed or receiving disability?: Unemployed  Risk to self with the past 6 months Suicidal Ideation: Yes-Currently Present Has patient been a risk to self within the past 6 months prior to admission? : No Suicidal Intent: No Has patient had any suicidal intent within the past 6 months prior to admission? : No Is patient at risk for suicide?: Yes Suicidal Plan?: No Has patient had any suicidal plan within the past 6 months prior to admission? : No Access to Means: No What has been your use of drugs/alcohol within the last 12 months?: daily alcohol use  Previous Attempts/Gestures: No Triggers for Past Attempts: None known Intentional Self Injurious Behavior: None Family Suicide History: No Recent stressful life event(s): Job Loss, Financial Problems, Recent negative physical changes Persecutory voices/beliefs?: No Depression: Yes Depression Symptoms: Despondent, Insomnia, Tearfulness, Isolating,  Fatigue, Guilt, Loss of interest in usual pleasures, Feeling worthless/self pity Substance abuse history and/or treatment for substance abuse?: Yes Suicide prevention information given to non-admitted patients: Not applicable  Risk to Others within the past 6 months Homicidal Ideation: No Does patient have any lifetime risk of violence toward others beyond the six months prior to admission? : No Thoughts of Harm to Others: No Current Homicidal Intent: No Current Homicidal Plan: No Access to Homicidal Means: No History of harm to others?: No Assessment of Violence: None Noted Does patient have access to weapons?: No Criminal Charges Pending?: No Does patient have a court date: No Is patient on probation?: No  Psychosis Hallucinations: None noted Delusions: None noted  Mental Status Report Appearance/Hygiene: In scrubs, Unremarkable Eye Contact: Good Motor Activity: Freedom of movement Speech: Logical/coherent Level of Consciousness: Alert Mood: Depressed, Despair, Empty, Helpless, Sad, Sullen, Worthless, low self-esteem Affect: Depressed, Flat Anxiety Level: None Thought Processes: Coherent, Relevant Judgement: Impaired Orientation: Person, Place, Time, Appropriate for developmental age, Situation Obsessive Compulsive Thoughts/Behaviors: None  Cognitive Functioning Concentration: Normal Memory: Remote Intact, Recent Intact Is  patient IDD: No Is patient DD?: No Insight: Poor Impulse Control: Poor Appetite: Good Have you had any weight changes? : No Change Sleep: Decreased Total Hours of Sleep: 4 Vegetative Symptoms: None  ADLScreening [facility] Assessment Services) Patient's cognitive ability adequate to safely complete daily activities?: Yes Patient able to express need for assistance with ADLs?: Yes Independently performs ADLs?: Yes (appropriate for developmental age)  Prior Inpatient Therapy Prior Inpatient Therapy: Yes Prior Therapy Dates: 2018 Prior Therapy  Facilty/Provider(s): [facility] Reason for Treatment: ALCOHOL DEPENDENCE  Prior Outpatient Therapy Prior Outpatient Therapy: Yes Prior Therapy Dates: 2019 Prior Therapy Facilty/Provider(s): [facility] OPT Reason for Treatment: MDD, GAD Does patient have an ACCT team?: No Does patient have Intensive In-House Services?  : No Does patient have [facility] services? : No Does patient have P4CC services?: No  ADL Screening (condition at time of admission) Patient's cognitive ability adequate to safely complete daily activities?: Yes Is the patient deaf or have difficulty hearing?: No Does the patient have difficulty seeing, even when wearing glasses/contacts?: No Does the patient have difficulty concentrating, remembering, or making decisions?: No Patient able to express need for assistance with ADLs?: Yes Does the patient have difficulty dressing or bathing?: No Independently performs ADLs?: Yes (appropriate for developmental age) Does the patient have difficulty walking or climbing stairs?: No Weakness of Legs: None Weakness of Arms/Hands: None  Home Assistive Devices/Equipment Home Assistive Devices/Equipment: None    Abuse/Neglect Assessment (Assessment to be complete while patient is alone) Abuse/Neglect Assessment Can Be Completed: Yes Physical Abuse: Denies Verbal Abuse: Denies Sexual Abuse: Denies Exploitation of patient/patient's resources: Denies Self-Neglect: Denies     Regulatory affairs officer (For Healthcare) Does Patient Have a Medical Advance Directive?: No Would patient like information on creating a medical advance directive?: No - Patient declined    Additional Information 1:1 In Past 12 Months?: No CIRT Risk: No Elopement Risk: No Does patient have medical clearance?: Yes     Disposition: TTS consulted with Patriciaann Clan, PA who recommends continued observation for safety and stabilization and to be reassessed in the AM by psych. EDP Mackuen, Fredia Sorrow, MD and pt's nurse  Staci Acosta, RN have been advised.    Disposition Initial Assessment Completed for this Encounter: Yes Disposition of Patient: (overnight OBS pending reassessment by psych in AM) Patient refused recommended treatment: No  This service was provided via telemedicine using a 2-way, interactive audio and video technology.  Names of all persons participating in this telemedicine service and their role in this encounter. Name: Zvi Duplantis Role: Patient  Name:Aquicha Riki Sheer Role:TTS          Lyanne Co 05/03/2018 8:19 PM

## 2018-05-03 NOTE — ED Notes (Signed)
Pt given a water and crackers

## 2018-05-03 NOTE — Progress Notes (Signed)
TTS spoke with Beckie Busing, RN in order to set up telepsych cart. [name], RN requested 15 minutes in order to locate cart and place in pt's room for the assessment.  Lind Covert, MSW, LCSW Therapeutic Triage Specialist  [phone]

## 2018-05-03 NOTE — ED Provider Notes (Signed)
Patient placed in Quick Look pathway, seen and evaluated   Chief Complaint: SI, alcohol detox, abdominal pain, hematuria and blood in the stool  HPI:   57 year old male presents with SI. He reports he doesn't want to live like this anymore. He is homeless and has been drinking a lot of alcohol. He went to [facility] but was told he needs to detox first so came here. He also reports epigastric abdominal pain, blood in the stool, and blood coming from his penis.   ROS: +SI, +blood in stool, +hematuria  Physical Exam:   Gen: No distress  Neuro: Awake and Alert  Skin: Warm    Focused Exam: Heart: Regular rate and rhythm. Lungs CTA. Abdomen: Soft, tender in epigastric area   Initiation of care has begun. The patient has been counseled on the process, plan, and necessity for staying for the completion/evaluation, and the remainder of the medical screening examination    Recardo Evangelist, PA-C 05/03/18 [place], Kevin, MD 05/03/18 [phone]

## 2018-05-03 NOTE — Progress Notes (Signed)
TTS consulted with Patriciaann Clan, PA who recommends continued observation for safety and stabilization and to be reassessed in the AM by psych. EDP Mackuen, Fredia Sorrow, MD and pt's nurse Staci Acosta, RN have been advised.   Lind Covert, MSW, LCSW Therapeutic Triage Specialist  [phone]

## 2018-05-03 NOTE — ED Notes (Signed)
Pt states he drinks 2 40 oz of malt liquor a day. Pt drank this morning. Pt says he stops drinking intermittently and has never had a problem with DT's.

## 2018-05-04 ENCOUNTER — Other Ambulatory Visit: Payer: Self-pay

## 2018-05-04 ENCOUNTER — Inpatient Hospital Stay (HOSPITAL_COMMUNITY)
Admission: AD | Admit: 2018-05-04 | Discharge: 2018-05-07 | DRG: 885 | Disposition: A | Discharge status: Home / Self Care | Payer: No Typology Code available for payment source | Source: Intra-hospital | Attending: Psychiatry | Admitting: Psychiatry

## 2018-05-04 ENCOUNTER — Encounter (HOSPITAL_COMMUNITY): Payer: Self-pay

## 2018-05-04 DIAGNOSIS — R45851 Suicidal ideations: Secondary | ICD-10-CM | POA: Diagnosis not present

## 2018-05-04 DIAGNOSIS — Z811 Family history of alcohol abuse and dependence: Secondary | ICD-10-CM

## 2018-05-04 DIAGNOSIS — Z79899 Other long term (current) drug therapy: Secondary | ICD-10-CM

## 2018-05-04 DIAGNOSIS — F332 Major depressive disorder, recurrent severe without psychotic features: Secondary | ICD-10-CM | POA: Diagnosis not present

## 2018-05-04 DIAGNOSIS — E785 Hyperlipidemia, unspecified: Secondary | ICD-10-CM | POA: Diagnosis present

## 2018-05-04 DIAGNOSIS — F1020 Alcohol dependence, uncomplicated: Secondary | ICD-10-CM | POA: Diagnosis not present

## 2018-05-04 DIAGNOSIS — F1390 Sedative, hypnotic, or anxiolytic use, unspecified, uncomplicated: Secondary | ICD-10-CM

## 2018-05-04 DIAGNOSIS — F1099 Alcohol use, unspecified with unspecified alcohol-induced disorder: Secondary | ICD-10-CM | POA: Diagnosis not present

## 2018-05-04 DIAGNOSIS — F1010 Alcohol abuse, uncomplicated: Secondary | ICD-10-CM | POA: Diagnosis not present

## 2018-05-04 DIAGNOSIS — D860 Sarcoidosis of lung: Secondary | ICD-10-CM | POA: Diagnosis present

## 2018-05-04 DIAGNOSIS — Z590 Homelessness: Secondary | ICD-10-CM | POA: Diagnosis not present

## 2018-05-04 DIAGNOSIS — F819 Developmental disorder of scholastic skills, unspecified: Secondary | ICD-10-CM | POA: Diagnosis not present

## 2018-05-04 DIAGNOSIS — K219 Gastro-esophageal reflux disease without esophagitis: Secondary | ICD-10-CM | POA: Diagnosis present

## 2018-05-04 DIAGNOSIS — F419 Anxiety disorder, unspecified: Secondary | ICD-10-CM | POA: Diagnosis not present

## 2018-05-04 DIAGNOSIS — Y908 Blood alcohol level of 240 mg/100 ml or more: Secondary | ICD-10-CM

## 2018-05-04 DIAGNOSIS — F411 Generalized anxiety disorder: Secondary | ICD-10-CM | POA: Diagnosis present

## 2018-05-04 DIAGNOSIS — G4700 Insomnia, unspecified: Secondary | ICD-10-CM | POA: Diagnosis not present

## 2018-05-04 DIAGNOSIS — Z885 Allergy status to narcotic agent status: Secondary | ICD-10-CM

## 2018-05-04 DIAGNOSIS — R450 Nervousness: Secondary | ICD-10-CM | POA: Diagnosis not present

## 2018-05-04 MED ORDER — ACETAMINOPHEN 325 MG PO TABS: 650.0000 mg | ORAL_TABLET | Freq: Four times a day (QID) | ORAL | Status: DC | PRN

## 2018-05-04 MED ORDER — ENSURE ENLIVE PO LIQD
237.0000 mL | Freq: Two times a day (BID) | ORAL | Status: DC
  Administered 2018-05-05 – 2018-05-07 (×4): 237 mL via ORAL

## 2018-05-04 MED ORDER — ADULT MULTIVITAMIN W/MINERALS CH
1.0000 | ORAL_TABLET | Freq: Every day | ORAL | Status: DC
  Administered 2018-05-05 – 2018-05-07 (×3): 1 via ORAL
  Filled 2018-05-04 (×6): qty 1

## 2018-05-04 MED ORDER — HYDROXYZINE HCL 25 MG PO TABS
25.0000 mg | ORAL_TABLET | Freq: Three times a day (TID) | ORAL | Status: DC | PRN
  Administered 2018-05-07: 25 mg via ORAL
  Filled 2018-05-04 (×2): qty 1

## 2018-05-04 MED ORDER — ALUM & MAG HYDROXIDE-SIMETH 200-200-20 MG/5ML PO SUSP
30.0000 mL | ORAL | Status: DC | PRN
  Administered 2018-05-06: 30 mL via ORAL
  Filled 2018-05-04: qty 30

## 2018-05-04 MED ORDER — CHLORDIAZEPOXIDE HCL 25 MG PO CAPS
25.0000 mg | ORAL_CAPSULE | Freq: Every day | ORAL | Status: DC
Start: 2018-05-07 — End: 2018-05-06

## 2018-05-04 MED ORDER — CHLORDIAZEPOXIDE HCL 25 MG PO CAPS
25.0000 mg | ORAL_CAPSULE | Freq: Four times a day (QID) | ORAL | Status: AC
  Administered 2018-05-04 (×3): 25 mg via ORAL
  Filled 2018-05-04 (×3): qty 1

## 2018-05-04 MED ORDER — TRAZODONE HCL 50 MG PO TABS
50.0000 mg | ORAL_TABLET | Freq: Every evening | ORAL | Status: DC | PRN
  Administered 2018-05-04: 50 mg via ORAL
  Filled 2018-05-04: qty 1

## 2018-05-04 MED ORDER — VITAMIN B-1 100 MG PO TABS
100.0000 mg | ORAL_TABLET | Freq: Every day | ORAL | Status: DC
  Administered 2018-05-05 – 2018-05-07 (×3): 100 mg via ORAL
  Filled 2018-05-04 (×5): qty 1

## 2018-05-04 MED ORDER — LOPERAMIDE HCL 2 MG PO CAPS: 2.0000 mg | ORAL_CAPSULE | ORAL | Status: DC | PRN

## 2018-05-04 MED ORDER — CHLORDIAZEPOXIDE HCL 25 MG PO CAPS
25.0000 mg | ORAL_CAPSULE | Freq: Three times a day (TID) | ORAL | Status: AC
  Administered 2018-05-05 (×3): 25 mg via ORAL
  Filled 2018-05-04 (×3): qty 1

## 2018-05-04 MED ORDER — MAGNESIUM HYDROXIDE 400 MG/5ML PO SUSP
30.0000 mL | Freq: Every day | ORAL | Status: DC | PRN
  Administered 2018-05-04: 30 mL via ORAL
  Filled 2018-05-04: qty 30

## 2018-05-04 MED ORDER — CHLORDIAZEPOXIDE HCL 25 MG PO CAPS: 25.0000 mg | ORAL_CAPSULE | Freq: Four times a day (QID) | ORAL | Status: DC | PRN

## 2018-05-04 MED ORDER — CHLORDIAZEPOXIDE HCL 25 MG PO CAPS
25.0000 mg | ORAL_CAPSULE | ORAL | Status: DC
  Administered 2018-05-06: 25 mg via ORAL
  Filled 2018-05-04: qty 1

## 2018-05-04 MED ORDER — ONDANSETRON 4 MG PO TBDP: 4.0000 mg | ORAL_TABLET | Freq: Four times a day (QID) | ORAL | Status: DC | PRN

## 2018-05-04 MED ORDER — HYDROXYZINE HCL 25 MG PO TABS
25.0000 mg | ORAL_TABLET | Freq: Four times a day (QID) | ORAL | Status: DC | PRN
  Administered 2018-05-04 – 2018-05-06 (×2): 25 mg via ORAL
  Filled 2018-05-04: qty 1
  Filled 2018-05-04: qty 20

## 2018-05-04 NOTE — Progress Notes (Signed)
Patient is a 57 y/o male who presented voluntarily to ED yesterday with suicidal ideation/ no plan- stating, "I didn't want to live anymore". Pt has a hx of alcohol use disorder and recurrent severe MDD without psychosis. Pt reports that he had recently relapsed on alcohol after being clean for 'nearly a year'. Pt reports having relapsed a couple of months ago, and since then has lost his job and has been homeless- being kicked out of his parent's house. Pt presented with a depressed affect/mood and was somewhat tremulous, but calm and cooperative during admission interview- answering questions appropriately- although stuttering at times. During interview, pt asked whether he will have to "do any writing while here", pt expressed concern reporting that he had a 'learning disability' and that "he struggles with writing". Pt expressed hopelessness and endorsed passive SI, reporting that his thoughts have improved a little stating, "they're not as strong as before." Pt is able to verbally contract for safety. VSmonitored and recorded. Skin check performed and revealed no contraband nor skin abnormalities. Admission paperwork completed and signed. Verbal understanding expressed. Belongings searched and secured in locker.  Patient was oriented to unit and schedule.  Fall risk precautions initiated due to pt reporting 'feeling weak'. Rest encouraged. PO fluids provided. Pt remains safe with 15 min checks

## 2018-05-04 NOTE — BHH Counselor (Addendum)
05/04/2018  05/04/2018:  Per Marvia Pickles, NP:  Patient meets inpatient criteria.  Patient accepted to [facility], room 304, bed 4, accepting Dr. Mallie Darting.  Attending Physician and Nurse Jacqlyn Larsen for provided disposition and [facility] acceptance.

## 2018-05-04 NOTE — Progress Notes (Signed)
[facility] Group Notes:  (Nursing/MHT/Case Management/Adjunct)  Date:  05/04/2018  Time:  2045  Type of Therapy:  wrap up group  Participation Level:  Active  Participation Quality:  Appropriate, Attentive, Sharing and Supportive  Affect:  Depressed  Cognitive:  Appropriate  Insight:  Improving  Engagement in Group:  Engaged  Modes of Intervention:  Clarification, Education and Support  Summary of Progress/Problems:  Shellia Cleverly 05/04/2018, 10:02 PM

## 2018-05-04 NOTE — ED Provider Notes (Signed)
8:40 AM patient is alert Glasgow Coma Score 15, pleasant and cooperative.  Ambulates without difficulty.  Stable for transfer to [place] Results for orders placed or performed during the hospital encounter of 05/03/18  Comprehensive metabolic panel  Result Value Ref Range   Sodium 138 135 - 145 mmol/L   Potassium 4.1 3.5 - 5.1 mmol/L   Chloride 97 (L) 101 - 111 mmol/L   CO2 27 22 - 32 mmol/L   Glucose, Bld 113 (H) 65 - 99 mg/dL   BUN 8 6 - 20 mg/dL   Creatinine, Ser 1.11 0.61 - 1.24 mg/dL   Calcium 8.9 8.9 - 10.3 mg/dL   Total Protein 7.5 6.5 - 8.1 g/dL   Albumin 4.1 3.5 - 5.0 g/dL   AST 59 (H) 15 - 41 U/L   ALT 25 17 - 63 U/L   Alkaline Phosphatase 44 38 - 126 U/L   Total Bilirubin 0.7 0.3 - 1.2 mg/dL   GFR calc non Af Amer >60 >60 mL/min   GFR calc Af Amer >60 >60 mL/min   Anion gap 14 5 - 15  Ethanol  Result Value Ref Range   Alcohol, Ethyl (B) 367 (HH) <40 mg/dL  Salicylate level  Result Value Ref Range   Salicylate Lvl <9.8 2.8 - 30.0 mg/dL  Acetaminophen level  Result Value Ref Range   Acetaminophen (Tylenol), Serum <10 (L) 10 - 30 ug/mL  cbc  Result Value Ref Range   WBC 5.4 4.0 - 10.5 K/uL   RBC 4.78 4.22 - 5.81 MIL/uL   Hemoglobin 12.4 (L) 13.0 - 17.0 g/dL   HCT 39.5 39.0 - 52.0 %   MCV 82.6 78.0 - 100.0 fL   MCH 25.9 (L) 26.0 - 34.0 pg   MCHC 31.4 30.0 - 36.0 g/dL   RDW 17.8 (H) 11.5 - 15.5 %   Platelets 244 150 - 400 K/uL  Rapid urine drug screen (hospital performed)  Result Value Ref Range   Opiates NONE DETECTED NONE DETECTED   Cocaine NONE DETECTED NONE DETECTED   Benzodiazepines POSITIVE (A) NONE DETECTED   Amphetamines NONE DETECTED NONE DETECTED   Tetrahydrocannabinol NONE DETECTED NONE DETECTED   Barbiturates NONE DETECTED NONE DETECTED  Urinalysis, Routine w reflex microscopic  Result Value Ref Range   Color, Urine STRAW (A) YELLOW   APPearance CLEAR CLEAR   Specific Gravity, Urine 1.004 (L) 1.005 - 1.030   pH 5.0 5.0 - 8.0   Glucose, UA NEGATIVE  NEGATIVE mg/dL   Hgb urine dipstick NEGATIVE NEGATIVE   Bilirubin Urine NEGATIVE NEGATIVE   Ketones, ur NEGATIVE NEGATIVE mg/dL   Protein, ur NEGATIVE NEGATIVE mg/dL   Nitrite NEGATIVE NEGATIVE   Leukocytes, UA NEGATIVE NEGATIVE  Protime-INR  Result Value Ref Range   Prothrombin Time 13.6 11.4 - 15.2 seconds   INR 1.05   Lipase, blood  Result Value Ref Range   Lipase 69 (H) 11 - 51 U/L  CBG monitoring, ED  Result Value Ref Range   Glucose-Capillary 111 (H) 65 - 99 mg/dL   Comment 1 Notify RN    Comment 2 Document in Chart    No results found.   Orlie Dakin, MD 05/04/18 [phone]

## 2018-05-04 NOTE — BHH Suicide Risk Assessment (Signed)
[facility] Admission Suicide Risk Assessment   Nursing information obtained from:  Patient Demographic factors:  Male, Living alone, Divorced or widowed Current Mental Status:  Suicidal ideation indicated by patient Loss Factors:  Decrease in vocational status, Financial problems / change in socioeconomic status Historical Factors:  Family history of mental illness or substance abuse Risk Reduction Factors:  Positive social support  Total Time spent with patient: 30 minutes Principal Problem: MDD (major depressive disorder), recurrent episode, severe ([place]) Diagnosis:   Patient Active Problem List   Diagnosis Date Noted  . MDD (major depressive disorder), recurrent episode, severe ([place]) [F33.2] 06/30/2017  . Lateral epicondylitis of right elbow [M77.11] 01/05/2017  . Strain, MCP, hand, right, initial encounter [P37.902I] 01/05/2017  . 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 Bilateral knee pain [M25.561, M25.562] 01/11/2016  . Chronic pain of right wrist [M25.531, G89.29] 07/07/2015  . 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 Dislocation of right shoulder joint [S43.004A] 05/20/2015  . Tear of medial meniscus of right knee [S83.241A] 03/24/2015  . 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 Chronic cough [R05] 01/15/2015  . Onychomycosis of toenail [B35.1] 12/15/2014  . Seborrhea [L21.9] 11/11/2014  . GERD (gastroesophageal reflux disease) [K21.9]   . Alcohol abuse [F10.10] 01/19/2014  . Alcohol dependence ([place]) [F10.20] 09/02/2013  . Generalized anxiety disorder [F41.1] 09/02/2013   Subjective Data: Patient is seen and examined.  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  The patient stated he was at the end of his rope.  He stated that his alcoholism and  cause problems with him losing his job, losing his family, and no one wanting to help him.  He has been recently homeless, and feeling depressed, hopeless and contemplating suicide.  He currently sleeps in the woods.  He has been drinking alcohol excessively for years.  He had [facility] little over a year ago, and remained sober for several months by report.  He is unable to remember the reason why he started drinking again.  He is been admitted to the hospital 2 or 3 times in the past and placed in substance abuse rehabilitation at least 6 or 7 times.  His blood alcohol on admission was 367.  He has presented twice already this month to the emergency room where he was not admitted and treated as an outpatient.  This treatment failed.  He was seen by his outpatient psychiatrist last on 02/07/2018.  At that time he was being treated with Antabuse as well as Lexapro.  He admitted to shakes, nausea, sweats, helplessness, hopelessness and worthlessness.  He was admitted to the hospital for evaluation and stabilization.  Continued Clinical Symptoms:    The "Alcohol Use Disorders Identification Test", Guidelines for Use in Primary Care, Second Edition.  World Pharmacologist [facility]). Score between 0-7:  no or low risk or alcohol related problems. Score between 8-15:  moderate risk of alcohol related problems. Score between 16-19:  high risk of alcohol related problems. Score 20 or above:  warrants further diagnostic evaluation for alcohol dependence and treatment.   CLINICAL FACTORS:   Depression:   Anhedonia Comorbid alcohol abuse/dependence Hopelessness Impulsivity Insomnia   Musculoskeletal: Strength & Muscle Tone: within normal limits Gait & Station: unsteady Patient leans: N/A  Psychiatric Specialty Exam: Physical Exam  Constitutional: He is oriented  to person, place, and time. He appears distressed.  HENT:  Head: Normocephalic and atraumatic.  Respiratory: Effort normal.   Neurological: He is oriented to person, place, and time.    ROS  Blood pressure 127/88, pulse (!) 110, temperature 99.1 F (37.3 C), temperature source Oral, resp. rate 18, height 5\' 9"  (1.753 m), weight 65.8 kg (145 lb), SpO2 98 %.Body mass index is 21.41 kg/m.  General Appearance: Disheveled  Eye Contact:  Minimal  Speech:  Pressured  Volume:  Decreased  Mood:  Anxious, Depressed and Dysphoric  Affect:  Congruent  Thought Process:  Coherent  Orientation:  Full (Time, Place, and Person)  Thought Content:  Logical  Suicidal Thoughts:  Yes.  without intent/plan  Homicidal Thoughts:  No  Memory:  Immediate;   Fair Recent;   Fair Remote;   Poor  Judgement:  Impaired  Insight:  Lacking  Psychomotor Activity:  Increased  Concentration:  Concentration: Poor and Attention Span: Poor  Recall:  Poor  Fund of Knowledge:  Fair  Language:  Fair  Akathisia:  Negative  Handed:  Right  AIMS (if indicated):     Assets:  Desire for Improvement Physical Health Resilience  ADL's:  Intact  Cognition:  WNL  Sleep:         COGNITIVE FEATURES THAT CONTRIBUTE TO RISK:  None    SUICIDE RISK:   Mild:  Suicidal ideation of limited frequency, intensity, duration, and specificity.  There are no identifiable plans, no associated intent, mild dysphoria and related symptoms, good self-control (both objective and subjective assessment), few other risk factors, and identifiable protective factors, including available and accessible social support.  PLAN OF CARE: Patient is seen and examined.  Patient is a 57 year old male with the above-stated past psychiatric history was admitted for suicidal ideation as well as alcohol dependence.  His blood alcohol on admission was 367.  He denied any previous histories of seizures but did complain of severe shakes and other symptoms.  He is currently in significant discomfort from alcohol withdrawal.  He is contemplating suicide.  He will be admitted to the  hospital.  He will be prescribed Effexor as well as Librium for detox.  He will be given thiamine and folate.  On the last 2 visits when he was in the emergency room earlier this month his liver function enzymes were significantly elevated.  Today his AST is 59, and his ALT is 25.  He also complained of significant pain with urination, but his urinalysis appears to be normal.  His blood pressure stable, but he is tachycardic.  His CIWA on admission was only 6, but he looks significantly worse now.  He will be placed on 15-minute checks.  He will be admitted and integrated into the milieu.  He will be encouraged to attend groups.  His medications that were successful previously will be restarted.  We will assist him in attempting to find a rehabilitation program for him after he is stabilized.  I certify that inpatient services furnished can reasonably be expected to improve the patient's condition.   Sharma Covert, MD 05/04/2018, 12:58 PM

## 2018-05-04 NOTE — H&P (Signed)
Psychiatric Admission Assessment Adult  Patient Identification: [name] [name] MRN:  [id number] Date of Evaluation:  05/04/2018 Chief Complaint:  MDD,rec,sev without psychosis Alcohol Use Disorder Principal Diagnosis: MDD (major depressive disorder), recurrent episode, severe ([place]) Diagnosis:   Patient Active Problem List   Diagnosis Date Noted  . MDD (major depressive disorder), recurrent episode, severe ([place]) [F33.2] 06/30/2017  . Lateral epicondylitis of right elbow [M77.11] 01/05/2017  . Strain, MCP, hand, right, initial encounter [Y70.623J] 01/05/2017  . 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 Bilateral knee pain [M25.561, M25.562] 01/11/2016  . Chronic pain of right wrist [M25.531, G89.29] 07/07/2015  . 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 Dislocation of right shoulder joint [S43.004A] 05/20/2015  . Tear of medial meniscus of right knee [S83.241A] 03/24/2015  . 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 Chronic cough [R05] 01/15/2015  . Onychomycosis of toenail [B35.1] 12/15/2014  . Seborrhea [L21.9] 11/11/2014  . GERD (gastroesophageal reflux disease) [K21.9]   . Alcohol abuse [F10.10] 01/19/2014  . Alcohol dependence ([place]) [F10.20] 09/02/2013  . Generalized anxiety disorder [F41.1] 09/02/2013   History of Present Illness:  05/03/18 [facility] Counselor Assessment: 57 y.o.malewho presents to the ED voluntarily.Pt reports he has been increasingly depressed, hopeless, and contemplating suicide. Pt stated "if I had a gun, I would not be here." Pt states he is homeless and sleeps in the woods. Pt states he has been drinking alcohol in excess for years and he has no support. Pt states he would rather die than to continue living this way. Pt has attended [facility] in the past for SA treatment and states he was able to stay sober for 1 year after SA treatment. Pt states he feels like God is punishing him and he continues to ask ""why am I alive, what is the point,  why am I going through all this hell?" Pt's current BAL is 367. Pt has a hx of ED visits c/o alcohol dependence. Pt's most recent TTS assessment occurred on 03/29/18 c/o similar concerns related to alcohol abuse and hopelessness. Pt states he does not have a current provider and has no family support. Pt is unable to contract for safety at this time and states if he is d/c he will try to harm himself.  On evaluation today: Patient confirms the above information. Patient was admitted a year ago in August for same complaint of alcohol abuse and suicidal ideations.  Patient stated today that he was kicked out of his parents house about a month ago due to his excessive alcoholism.  Patient states that he wants to get some help for his drinking and his depression.  He is hoping to have a place to stay but he is already informed that we do not provide housing to the hospital, but we can give him resources to help him after discharge.  Patient has been prescribed Effexor in the past, however, patient states that he continued to drink while on the Effexor so he is not sure if it helped him or not.  Based off a chart review I do not see where patient had remained sober for 1 year as he reported.  Patient meets inpatient criteria. Patient reports current passive SI, but denies any HI/AVH and contracts for safety. Patient rerpeatedly denies any drug abuse, but can't explain the UDS being positive for benzodiazepines.     Associated Signs/Symptoms: Depression Symptoms:  depressed mood, anhedonia, insomnia, fatigue, feelings of worthlessness/guilt, hopelessness, suicidal thoughts without plan, anxiety, loss of energy/fatigue, disturbed sleep,  weight loss, decreased appetite, (Hypo) Manic Symptoms:  Denies Anxiety Symptoms:  Excessive Worry, Panic Symptoms, Psychotic Symptoms:  Denies PTSD Symptoms: NA Total Time spent with patient: 45 minutes  Past Psychiatric History: History of prior  hospitalization, anxiety, MDD, alcohol abuse   Is the patient at risk to self? Yes.    Has the patient been a risk to self in the past 6 months? No.  Has the patient been a risk to self within the distant past? Yes.    Is the patient a risk to others? No.  Has the patient been a risk to others in the past 6 months? No.  Has the patient been a risk to others within the distant past? No.   Prior Inpatient Therapy:   Prior Outpatient Therapy:    Alcohol Screening:   Substance Abuse History in the last 12 months:  Yes.   Consequences of Substance Abuse: Medical Consequences:  reviewed Legal Consequences:  reviewed Family Consequences:  reviewed Previous Psychotropic Medications: Yes  Psychological Evaluations: Yes  Past Medical History:  Past Medical History:  Diagnosis Date  . Alcohol abuse    Last Drink on Sep 2018  . Anxiety    About 57 years of age  . Carpal tunnel syndrome of right wrist Dx 2015  . Depression    At 57 years of age  . GERD (gastroesophageal reflux disease) Dx 2007  . Hepatitis, alcoholic, acute 0347  . Hyperlipidemia    borderline, diet controlled  . Pancreatitis Dx 2014  . Pneumonia 11/2014  . 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 Reflux Dx 2007  . 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 carpel tunnel release Right 07/28/2014    done in [place]   . ELBOW SURGERY    . KNEE ARTHROSCOPY    . KNEE SURGERY Left    15 years ago  . LIPOMA EXCISION N/A 05/10/2016   Procedure: EXCISION OF SCALP LIPOMA;  Surgeon: Johnathan Hausen, MD;  Location: [facility];  Service: General;  Laterality: N/A;   Family History:  Family History  Problem Relation Age of Onset  . Diabetes Father   . Hypertension Father   . Hypertension Paternal Grandfather   . Alcohol abuse Paternal Grandfather   . Alcohol abuse Maternal Grandfather   . Alcohol abuse Maternal Grandmother   . Alcohol abuse  Paternal Grandmother   . Colon cancer Neg Hx   . Rectal cancer Neg Hx   . Stomach cancer Neg Hx   . Bipolar disorder Neg Hx   . Depression Neg Hx    Family Psychiatric  History: Family history of substance abuse on both sides.  Denies any family history of mental illness or history of suicide  Tobacco Screening:   Social History:  Social History   Substance and Sexual Activity  Alcohol Use Yes  . Alcohol/week: 0.0 oz  . Frequency: Never   Comment: last use today 05/03/18      Social History   Substance and Sexual Activity  Drug Use Not Currently  . Types: Marijuana   Comment: Patient denies     Additional Social History:                           Allergies:   Allergies  Allergen Reactions  . Oxycodone Other (See Comments)    abd pain, stomach cramps    Lab  Results:  Results for orders placed or performed during the hospital encounter of 05/03/18 (from the past 48 hour(s))  Comprehensive metabolic panel     Status: Abnormal   Collection Time: 05/03/18  3:44 PM  Result Value Ref Range   Sodium 138 135 - 145 mmol/L   Potassium 4.1 3.5 - 5.1 mmol/L   Chloride 97 (L) 101 - 111 mmol/L   CO2 27 22 - 32 mmol/L   Glucose, Bld 113 (H) 65 - 99 mg/dL   BUN 8 6 - 20 mg/dL   Creatinine, Ser 1.11 0.61 - 1.24 mg/dL   Calcium 8.9 8.9 - 10.3 mg/dL   Total Protein 7.5 6.5 - 8.1 g/dL   Albumin 4.1 3.5 - 5.0 g/dL   AST 59 (H) 15 - 41 U/L   ALT 25 17 - 63 U/L   Alkaline Phosphatase 44 38 - 126 U/L   Total Bilirubin 0.7 0.3 - 1.2 mg/dL   GFR calc non Af Amer >60 >60 mL/min   GFR calc Af Amer >60 >60 mL/min    Comment: (NOTE) The eGFR has been calculated using the CKD EPI equation. This calculation has not been validated in all clinical situations. eGFR's persistently <60 mL/min signify possible Chronic Kidney Disease.    Anion gap 14 5 - 15    Comment: Performed at [facility] [address] [place], [place] [address]  Ethanol     Status: Abnormal    Collection Time: 05/03/18  3:44 PM  Result Value Ref Range   Alcohol, Ethyl (B) 367 (HH) <10 mg/dL    Comment: CRITICAL RESULT CALLED TO, READ BACK BY AND VERIFIED WITH: K. [facility] RN [phone] GREEN R (NOTE) Lowest detectable limit for serum alcohol is 10 mg/dL. For medical purposes only. Performed at [facility], [place] [address], [place], [place] [address]   Salicylate level     Status: None   Collection Time: 05/03/18  3:44 PM  Result Value Ref Range   Salicylate Lvl <3.8 2.8 - 30.0 mg/dL    Comment: Performed at [facility] [address] [place], [place] [address]  Acetaminophen level     Status: Abnormal   Collection Time: 05/03/18  3:44 PM  Result Value Ref Range   Acetaminophen (Tylenol), Serum <10 (L) 10 - 30 ug/mL    Comment: (NOTE) Therapeutic concentrations vary significantly. A range of 10-30 ug/mL  may be an effective concentration for many patients. However, some  are best treated at concentrations outside of this range. Acetaminophen concentrations >150 ug/mL at 4 hours after ingestion  and >50 ug/mL at 12 hours after ingestion are often associated with  toxic reactions. Performed at [facility], [place] [address] [place], [place] [address]   cbc     Status: Abnormal   Collection Time: 05/03/18  3:44 PM  Result Value Ref Range   WBC 5.4 4.0 - 10.5 K/uL   RBC 4.78 4.22 - 5.81 MIL/uL   Hemoglobin 12.4 (L) 13.0 - 17.0 g/dL   HCT 39.5 39.0 - 52.0 %   MCV 82.6 78.0 - 100.0 fL   MCH 25.9 (L) 26.0 - 34.0 pg   MCHC 31.4 30.0 - 36.0 g/dL   RDW 17.8 (H) 11.5 - 15.5 %   Platelets 244 150 - 400 K/uL    Comment: Performed at [facility] [address] [place], [place] [address]  CBG monitoring, ED     Status: Abnormal  Collection Time: 05/03/18  3:44 PM  Result Value Ref Range   Glucose-Capillary 111 (H) 65 - 99 mg/dL   Comment 1 Notify RN    Comment 2 Document in Chart   Rapid urine drug screen (hospital performed)     Status:  Abnormal   Collection Time: 05/03/18  4:07 PM  Result Value Ref Range   Opiates NONE DETECTED NONE DETECTED   Cocaine NONE DETECTED NONE DETECTED   Benzodiazepines POSITIVE (A) NONE DETECTED   Amphetamines NONE DETECTED NONE DETECTED   Tetrahydrocannabinol NONE DETECTED NONE DETECTED   Barbiturates NONE DETECTED NONE DETECTED    Comment: (NOTE) DRUG SCREEN FOR MEDICAL PURPOSES ONLY.  IF CONFIRMATION IS NEEDED FOR ANY PURPOSE, NOTIFY LAB WITHIN 5 DAYS. LOWEST DETECTABLE LIMITS FOR URINE DRUG SCREEN Drug Class                     Cutoff (ng/mL) Amphetamine and metabolites    1000 Barbiturate and metabolites    200 Benzodiazepine                 315 Tricyclics and metabolites     300 Opiates and metabolites        300 Cocaine and metabolites        300 THC                            50 Performed at [facility], [place] [address] [place], [place] [address]   Urinalysis, Routine w reflex microscopic     Status: Abnormal   Collection Time: 05/03/18  4:07 PM  Result Value Ref Range   Color, Urine STRAW (A) YELLOW   APPearance CLEAR CLEAR   Specific Gravity, Urine 1.004 (L) 1.005 - 1.030   pH 5.0 5.0 - 8.0   Glucose, UA NEGATIVE NEGATIVE mg/dL   Hgb urine dipstick NEGATIVE NEGATIVE   Bilirubin Urine NEGATIVE NEGATIVE   Ketones, ur NEGATIVE NEGATIVE mg/dL   Protein, ur NEGATIVE NEGATIVE mg/dL   Nitrite NEGATIVE NEGATIVE   Leukocytes, UA NEGATIVE NEGATIVE    Comment: Performed at [facility] [address], [place], [place] [address]  Protime-INR     Status: None   Collection Time: 05/03/18  4:07 PM  Result Value Ref Range   Prothrombin Time 13.6 11.4 - 15.2 seconds   INR 1.05     Comment: Performed at [facility] [address] [place], [place] [address]  Lipase, blood     Status: Abnormal   Collection Time: 05/03/18  4:07 PM  Result Value Ref Range   Lipase 69 (H) 11 - 51 U/L    Comment: Performed at [facility] [address]  [place], [place] [address]    Blood Alcohol level:  Lab Results  Component Value Date   ETH 367 [facility]) 05/03/2018   ETH <10 27/01/5008    Metabolic Disorder Labs:  Lab Results  Component Value Date   HGBA1C 6.0 10/22/2017   MPG 120 06/30/2017   No results found for: PROLACTIN Lab Results  Component Value Date   CHOL 330 (H) 06/30/2017   TRIG 61 06/30/2017   HDL 196 06/30/2017   CHOLHDL 1.7 06/30/2017   VLDL 12 06/30/2017   LDLCALC 122 (H) 06/30/2017   LDLCALC 128 (H) 12/15/2014    Current Medications: No current facility-administered medications for this encounter.    PTA Medications: Medications Prior to Admission  Medication Sig Dispense Refill Last Dose  . chlordiazePOXIDE (LIBRIUM) 25 MG capsule 70m PO TID x 1D, then 25-542mPO BID X 1D, then 25-5067mO QD X 1D (Patient not taking: Reported on 05/03/2018) 10 capsule 0 Not Taking at Unknown time  . disulfiram (ANTABUSE) 250 MG tablet Take 1 tablet (250 mg total) by mouth 2 (two) times daily. (Patient not taking: Reported on 04/16/2018) 60 tablet 1 Not Taking at Unknown time  . gabapentin (NEURONTIN) 100 MG capsule Take 1 capsule (100 mg total) by mouth 3 (three) times daily. (Patient not taking: Reported on 05/03/2018) 90 capsule 1 Not Taking at Unknown time  . omeprazole (PRILOSEC) 20 MG capsule Take 1 capsule (20 mg total) by mouth daily. (Patient not taking: Reported on 05/03/2018) 90 capsule 1 Not Taking at Unknown time  . ondansetron (ZOFRAN ODT) 4 MG disintegrating tablet 4mg38mT q4 hours prn nausea/vomit (Patient not taking: Reported on 05/03/2018) 10 tablet 0 Not Taking at Unknown time  . pantoprazole (PROTONIX) 20 MG tablet Take 1 tablet (20 mg total) by mouth 2 (two) times daily. (Patient not taking: Reported on 03/29/2018) 30 tablet 0 Not Taking at Unknown time  . venlafaxine XR (EFFEXOR XR) 75 MG 24 hr capsule Take 1 capsule (75 mg total) by mouth daily. (Patient not taking: Reported on 03/29/2018) 30 capsule 1 Not Taking at  Unknown time    Musculoskeletal: Strength & Muscle Tone: within normal limits Gait & Station: normal Patient leans: N/A  Psychiatric Specialty Exam: Physical Exam  ROS  There were no vitals taken for this visit.There is no height or weight on file to calculate BMI.  General Appearance: Casual  Eye Contact:  Fair  Speech:  Clear and Coherent and Normal Rate  Volume:  Decreased  Mood:  Depressed  Affect:  Depressed and Flat  Thought Process:  Goal Directed and Descriptions of Associations: Intact  Orientation:  Full (Time, Place, and Person)  Thought Content:  WDL  Suicidal Thoughts:  Yes.  without intent/plan  Homicidal Thoughts:  No  Memory:  Immediate;   Good Recent;   Good Remote;   Good  Judgement:  Fair  Insight:  Fair  Psychomotor Activity:  Normal  Concentration:  Concentration: Good and Attention Span: Good  Recall:  Good  Fund of Knowledge:  Good  Language:  Good  Akathisia:  No  Handed:  Right  AIMS (if indicated):     Assets:  Communication Skills Desire for Improvement Resilience  ADL's:  Intact  Cognition:  WNL  Sleep:       Treatment Plan Summary: Daily contact with patient to assess and evaluate symptoms and progress in treatment, Medication management and Plan is to:  -Encourage group therapy participation -See MAR and SRA for medication management -CSW will assist with residential rehab placement  Observation Level/Precautions:  15 minute checks  Laboratory:  Reviewed  Psychotherapy:    Medications:    Consultations:    Discharge Concerns:    Estimated LOS:  Other:     Physician Treatment Plan for Primary Diagnosis: MDD (major depressive disorder), recurrent episode, severe ([facility])[place] Term Goal(s): Improvement in symptoms so as ready for discharge  Short Term Goals: Ability to identify and develop effective coping behaviors will improve, Ability to maintain clinical measurements within normal limits will improve, Compliance with prescribed  medications will improve and Ability to identify triggers associated with substance abuse/mental health issues will improve  Physician Treatment Plan for Secondary Diagnosis: Principal Problem:  MDD (major depressive disorder), recurrent episode, severe ([place]) Active Problems:   Alcohol dependence ([place])   Alcohol abuse  Long Term Goal(s): Improvement in symptoms so as ready for discharge  Short Term Goals: Ability to identify changes in lifestyle to reduce recurrence of condition will improve, Ability to verbalize feelings will improve, Ability to disclose and discuss suicidal ideas and Ability to demonstrate self-control will improve  I certify that inpatient services furnished can reasonably be expected to improve the patient's condition.    Lewis Shock, FNP 6/8/201910:42 AM

## 2018-05-04 NOTE — ED Notes (Signed)
Pt voiced understanding and agreement w/tx plan. Accepted to [facility] - signed consent form - copy faxed to [facility] - copy sent to Medical Records and original placed in envelope for [facility].

## 2018-05-04 NOTE — Tx Team (Signed)
Initial Treatment Plan 05/04/2018 1:08 PM [name] [name] IWP:[id number]    PATIENT STRESSORS: Financial difficulties Medication change or noncompliance Substance abuse,    PATIENT STRENGTHS: Average or above average intelligence Motivation for treatment/growth Supportive family/friends   PATIENT IDENTIFIED PROBLEMS:   "I need to learn how to stay off of alcohol"      " I want to cope with life's stressors without a substance"             DISCHARGE CRITERIA:  Adequate post-discharge living arrangements Improved stabilization in mood, thinking, and/or behavior Motivation to continue treatment in a less acute level of care Verbal commitment to aftercare and medication compliance  PRELIMINARY DISCHARGE PLAN: Attend aftercare/continuing care group Attend 12-step recovery group Outpatient therapy  PATIENT/FAMILY INVOLVEMENT: This treatment plan has been presented to and reviewed with the patient, [name] [name] The patient has been given the opportunity to ask questions and make suggestions.  Waymond Cera, RN 05/04/2018, 1:08 PM

## 2018-05-04 NOTE — Progress Notes (Signed)
D:  [name] [name] was in his room on [facility].  He stated that he was feeling better than he did earlier.  He reported bouncing from hot to cold but denied any other withdrawal symptoms at that time.  Later in the evening, he voiced having stomach cramping.  Staffed with Patriciaann Clan PA who recommended milk of magnesia which was given prn.  He denied SI/HI or A/V hallucinations.  He did attend evening group.  He was noted interacting well with peers.   A:  1:1 with RN for support and encouragement.  Medications as ordered.  Q 15 minute checks maintained for safety.  Encouraged participation in group and unit activities.   R:  [name] [name] remains safe on the unit.  We will continue to monitor the progress towards his goals.

## 2018-05-04 NOTE — Consult Note (Signed)
Telepsych Consultation   Reason for Consult:  Reporting Si Referring Physician:  Dr. Thomasene Lot Location of Patient:  Location of Provider: [place] Department  Patient Identification: [name] [name] MRN:  [id number] Principal Diagnosis: MDD (major depressive disorder), recurrent episode, severe ([place]) Diagnosis:   Patient Active Problem List   Diagnosis Date Noted  . MDD (major depressive disorder), recurrent episode, severe ([place]) [F33.2] 06/30/2017  . Lateral epicondylitis of right elbow [M77.11] 01/05/2017  . Strain, MCP, hand, right, initial encounter [K24.097D] 01/05/2017  . 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 Bilateral knee pain [M25.561, M25.562] 01/11/2016  . Chronic pain of right wrist [M25.531, G89.29] 07/07/2015  . 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 Dislocation of right shoulder joint [S43.004A] 05/20/2015  . Tear of medial meniscus of right knee [S83.241A] 03/24/2015  . 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 Chronic cough [R05] 01/15/2015  . Onychomycosis of toenail [B35.1] 12/15/2014  . Seborrhea [L21.9] 11/11/2014  . GERD (gastroesophageal reflux disease) [K21.9]   . Alcohol abuse [F10.10] 01/19/2014  . Alcohol dependence ([place]) [F10.20] 09/02/2013  . Generalized anxiety disorder [F41.1] 09/02/2013    Total Time spent with patient: 30 minutes  Subjective:   [name] [name] is a 57 y.o. male patient presented to the ED with complaint of suicidal ideations.  Patient reports that he is homeless and that he and alcohol abuse problem.  He is seeking detox and assistance with treatment in a residential facility.  HPI:   05/03/18 [facility] Counselor Assessment: 57 y.o. male who presents to the ED voluntarily. Pt reports he has been increasingly depressed, hopeless, and contemplating suicide. Pt stated "if I had a gun, I would not be here." Pt states he is homeless and sleeps in the woods. Pt states he has been drinking alcohol in excess  for years and he has no support. Pt states he would rather die than to continue living this way. Pt has attended [facility] in the past for SA treatment and states he was able to stay sober for 1 year after SA treatment. Pt states he feels like God is punishing him and he continues to ask ""why am I alive, what is the point, why am I going through all this hell?" Pt's current BAL is 367. Pt has a hx of ED visits c/o alcohol dependence. Pt's most recent TTS assessment occurred on 03/29/18 c/o similar concerns related to alcohol abuse and hopelessness. Pt states he does not have a current provider and has no family support. Pt is unable to contract for safety at this time and states if he is d/c he will try to harm himself.   Of note patient was admitted a year ago in August for same complaint of alcohol abuse and suicidal ideations.  Patient stated today that he was kicked out of his parents house about a month ago due to his excessive alcoholism.  Patient states that he wants to get some help for his drinking and his depression.  He is hoping to have a place to stay but he is already informed that we do not provide housing to the hospital, but we can give him resources to help him after discharge.  Patient has been prescribed Effexor in the past, however, patient states that he continued to drink while on the Effexor so he is not sure if it helped him or not.  Based off a chart review I do not see where patient had remained sober for 1 year as he reported.  Patient meets inpatient criteria.  Past Psychiatric History: History of prior hospitalization, anxiety, MDD, alcohol abuse  Risk to Self: Suicidal Ideation: Yes-Currently Present Suicidal Intent: No Is patient at risk for suicide?: Yes Suicidal Plan?: No Access to Means: No What has been your use of drugs/alcohol within the last 12 months?: daily alcohol use  Triggers for Past Attempts: None known Intentional Self Injurious Behavior: None Risk to  Others: Homicidal Ideation: No Thoughts of Harm to Others: No Current Homicidal Intent: No Current Homicidal Plan: No Access to Homicidal Means: No History of harm to others?: No Assessment of Violence: None Noted Does patient have access to weapons?: No Criminal Charges Pending?: No Does patient have a court date: No Prior Inpatient Therapy: Prior Inpatient Therapy: Yes Prior Therapy Dates: 2018 Prior Therapy Facilty/Provider(s): [facility] Reason for Treatment: ALCOHOL DEPENDENCE Prior Outpatient Therapy: Prior Outpatient Therapy: Yes Prior Therapy Dates: 2019 Prior Therapy Facilty/Provider(s): [facility] Reason for Treatment: MDD, GAD Does patient have an ACCT team?: No Does patient have Intensive In-House Services?  : No Does patient have [facility] services? : No Does patient have P4CC services?: No  Past Medical History:  Past Medical History:  Diagnosis Date  . Alcohol abuse    Last Drink on Sep 2018  . Anxiety    About 57 years of age  . Carpal tunnel syndrome of right wrist Dx 2015  . Depression    At 57 years of age  . GERD (gastroesophageal reflux disease) Dx 2007  . Hepatitis, alcoholic, acute 6967  . Hyperlipidemia    borderline, diet controlled  . Pancreatitis Dx 2014  . Pneumonia 11/2014  . 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 Reflux Dx 2007  . 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 carpel tunnel release Right 07/28/2014    done in [place]   . ELBOW SURGERY    . KNEE ARTHROSCOPY    . KNEE SURGERY Left    15 years ago  . LIPOMA EXCISION N/A 05/10/2016   Procedure: EXCISION OF SCALP LIPOMA;  Surgeon: Johnathan Hausen, MD;  Location: [place];  Service: General;  Laterality: N/A;   Family History:  Family History  Problem Relation Age of Onset  . Diabetes Father   . Hypertension Father   . Hypertension Paternal Grandfather   . Alcohol abuse Paternal Grandfather   . Alcohol  abuse Maternal Grandfather   . Alcohol abuse Maternal Grandmother   . Alcohol abuse Paternal Grandmother   . Colon cancer Neg Hx   . Rectal cancer Neg Hx   . Stomach cancer Neg Hx   . Bipolar disorder Neg Hx   . Depression Neg Hx    Family Psychiatric  History: Family history of substance abuse on both sides.  Denies any family history of mental illness or history of suicide Social History:  Social History   Substance and Sexual Activity  Alcohol Use Yes  . Alcohol/week: 0.0 oz  . Frequency: Never   Comment: last use today 05/03/18      Social History   Substance and Sexual Activity  Drug Use Not Currently  . Types: Marijuana   Comment: Patient denies     Social History   Socioeconomic History  . Marital status: Legally Separated    Spouse name: Not on file  . Number of children: 2  . Years of education: 13 yrs  . Highest education level: Not on file  Occupational History  . Occupation: custodian    Comment: [employer]  Social Needs  . Financial resource strain: Not on file  . Food insecurity:    Worry: Not on file    Inability: Not on file  . Transportation needs:    Medical: Not on file    Non-medical: Not on file  Tobacco Use  . Smoking status: Never Smoker  . Smokeless tobacco: Never Used  Substance and Sexual Activity  . Alcohol use: Yes    Alcohol/week: 0.0 oz    Frequency: Never    Comment: last use today 05/03/18   . Drug use: Not Currently    Types: Marijuana    Comment: Patient denies   . Sexual activity: Not on file  Lifestyle  . Physical activity:    Days per week: Not on file    Minutes per session: Not on file  . Stress: Not on file  Relationships  . Social connections:    Talks on phone: Not on file    Gets together: Not on file    Attends religious service: Not on file    Active member of club or organization: Not on file    Attends meetings of clubs or organizations: Not on file    Relationship status: Not on file  Other  Topics Concern  . Not on file  Social History Narrative   Born and raised in [place], [place] by both parents. He has 2 sisters and is the middle child. He remains close with family. He was married for 16 yrs and seperated in 2005. He has 2 boys who are in their 20's now. He lives alone in [place]. He is working as a Tax adviser Social History:    Allergies:   Allergies  Allergen Reactions  . Oxycodone Other (See Comments)    abd pain, stomach cramps     Labs:  Results for orders placed or performed during the hospital encounter of 05/03/18 (from the past 48 hour(s))  Comprehensive metabolic panel     Status: Abnormal   Collection Time: 05/03/18  3:44 PM  Result Value Ref Range   Sodium 138 135 - 145 mmol/L   Potassium 4.1 3.5 - 5.1 mmol/L   Chloride 97 (L) 101 - 111 mmol/L   CO2 27 22 - 32 mmol/L   Glucose, Bld 113 (H) 65 - 99 mg/dL   BUN 8 6 - 20 mg/dL   Creatinine, Ser 1.11 0.61 - 1.24 mg/dL   Calcium 8.9 8.9 - 10.3 mg/dL   Total Protein 7.5 6.5 - 8.1 g/dL   Albumin 4.1 3.5 - 5.0 g/dL   AST 59 (H) 15 - 41 U/L   ALT 25 17 - 63 U/L   Alkaline Phosphatase 44 38 - 126 U/L   Total Bilirubin 0.7 0.3 - 1.2 mg/dL   GFR calc non Af Amer >60 >60 mL/min   GFR calc Af Amer >60 >60 mL/min    Comment: (NOTE) The eGFR has been calculated using the CKD EPI equation. This calculation has not been validated in all clinical situations. eGFR's persistently <60 mL/min signify possible Chronic Kidney Disease.    Anion gap 14 5 - 15    Comment: Performed at [facility] [address] [place], [place] [address]  Ethanol     Status: Abnormal   Collection Time: 05/03/18  3:44 PM  Result Value Ref Range   Alcohol, Ethyl (B) 367 (HH) <10 mg/dL    Comment:  CRITICAL RESULT CALLED TO, READ BACK BY AND VERIFIED WITH: K. [facility] RN [id number] GREEN R (NOTE) Lowest detectable limit for serum alcohol is 10 mg/dL. For medical purposes only. Performed at [facility], [place] [address], [place], [place] [address]   Salicylate level     Status: None   Collection Time: 05/03/18  3:44 PM  Result Value Ref Range   Salicylate Lvl <1.7 2.8 - 30.0 mg/dL    Comment: Performed at [facility] [address], [place], [place] [address]  Acetaminophen level     Status: Abnormal   Collection Time: 05/03/18  3:44 PM  Result Value Ref Range   Acetaminophen (Tylenol), Serum <10 (L) 10 - 30 ug/mL    Comment: (NOTE) Therapeutic concentrations vary significantly. A range of 10-30 ug/mL  may be an effective concentration for many patients. However, some  are best treated at concentrations outside of this range. Acetaminophen concentrations >150 ug/mL at 4 hours after ingestion  and >50 ug/mL at 12 hours after ingestion are often associated with  toxic reactions. Performed at [facility], [place] [address] [place], [place] [address]   cbc     Status: Abnormal   Collection Time: 05/03/18  3:44 PM  Result Value Ref Range   WBC 5.4 4.0 - 10.5 K/uL   RBC 4.78 4.22 - 5.81 MIL/uL   Hemoglobin 12.4 (L) 13.0 - 17.0 g/dL   HCT 39.5 39.0 - 52.0 %   MCV 82.6 78.0 - 100.0 fL   MCH 25.9 (L) 26.0 - 34.0 pg   MCHC 31.4 30.0 - 36.0 g/dL   RDW 17.8 (H) 11.5 - 15.5 %   Platelets 244 150 - 400 K/uL    Comment: Performed at [facility] [address] [place], [place] [address]  CBG monitoring, ED     Status: Abnormal   Collection Time: 05/03/18  3:44 PM  Result Value Ref Range   Glucose-Capillary 111 (H) 65 - 99 mg/dL   Comment 1 Notify RN    Comment 2 Document in Chart   Rapid urine drug screen (hospital performed)     Status: Abnormal   Collection Time: 05/03/18  4:07 PM  Result Value Ref Range   Opiates NONE DETECTED NONE DETECTED   Cocaine NONE DETECTED NONE DETECTED   Benzodiazepines POSITIVE (A) NONE DETECTED   Amphetamines NONE DETECTED NONE DETECTED   Tetrahydrocannabinol NONE DETECTED NONE DETECTED   Barbiturates NONE DETECTED NONE DETECTED    Comment:  (NOTE) DRUG SCREEN FOR MEDICAL PURPOSES ONLY.  IF CONFIRMATION IS NEEDED FOR ANY PURPOSE, NOTIFY LAB WITHIN 5 DAYS. LOWEST DETECTABLE LIMITS FOR URINE DRUG SCREEN Drug Class                     Cutoff (ng/mL) Amphetamine and metabolites    1000 Barbiturate and metabolites    200 Benzodiazepine                 150 Tricyclics and metabolites     300 Opiates and metabolites        300 Cocaine and metabolites        300 THC                            50 Performed at [facility], [place] [address] [place], [place] [address]   Urinalysis, Routine w reflex microscopic  Status: Abnormal   Collection Time: 05/03/18  4:07 PM  Result Value Ref Range   Color, Urine STRAW (A) YELLOW   APPearance CLEAR CLEAR   Specific Gravity, Urine 1.004 (L) 1.005 - 1.030   pH 5.0 5.0 - 8.0   Glucose, UA NEGATIVE NEGATIVE mg/dL   Hgb urine dipstick NEGATIVE NEGATIVE   Bilirubin Urine NEGATIVE NEGATIVE   Ketones, ur NEGATIVE NEGATIVE mg/dL   Protein, ur NEGATIVE NEGATIVE mg/dL   Nitrite NEGATIVE NEGATIVE   Leukocytes, UA NEGATIVE NEGATIVE    Comment: Performed at [facility] [address], [place], [place] [address]  Protime-INR     Status: None   Collection Time: 05/03/18  4:07 PM  Result Value Ref Range   Prothrombin Time 13.6 11.4 - 15.2 seconds   INR 1.05     Comment: Performed at [facility] [address], [place], [place] [address]  Lipase, blood     Status: Abnormal   Collection Time: 05/03/18  4:07 PM  Result Value Ref Range   Lipase 69 (H) 11 - 51 U/L    Comment: Performed at [facility] [address] [place], [place] [address]    Medications:  Current Facility-Administered Medications  Medication Dose Route Frequency Provider Last Rate Last Dose  . acetaminophen (TYLENOL) tablet 650 mg  650 mg Oral Q4H PRN Mackuen, Courteney Lyn, MD      . gi cocktail (Maalox,Lidocaine,Donnatal)  30 mL Oral Once Recardo Evangelist, PA-C   Stopped at 05/03/18 1816  .  LORazepam (ATIVAN) injection 0-4 mg  0-4 mg Intravenous Q6H Mackuen, Courteney Lyn, MD       Or  . LORazepam (ATIVAN) tablet 0-4 mg  0-4 mg Oral Q6H Mackuen, Courteney Lyn, MD   1 mg at 05/04/18 0739  . [START ON 05/06/2018] LORazepam (ATIVAN) injection 0-4 mg  0-4 mg Intravenous Q12H Mackuen, Courteney Lyn, MD       Or  . Derrill Memo ON 05/06/2018] LORazepam (ATIVAN) tablet 0-4 mg  0-4 mg Oral Q12H Mackuen, Courteney Lyn, MD      . pantoprazole (PROTONIX) EC tablet 20 mg  20 mg Oral BID Mackuen, Courteney Lyn, MD   20 mg at 05/04/18 0738  . thiamine (VITAMIN B-1) tablet 100 mg  100 mg Oral Daily Mackuen, Courteney Lyn, MD   100 mg at 05/04/18 1779   Or  . thiamine (B-1) injection 100 mg  100 mg Intravenous Daily Mackuen, Courteney Lyn, MD       Current Outpatient Medications  Medication Sig Dispense Refill  . chlordiazePOXIDE (LIBRIUM) 25 MG capsule 65m PO TID x 1D, then 25-568mPO BID X 1D, then 25-5076mO QD X 1D (Patient not taking: Reported on 05/03/2018) 10 capsule 0  . disulfiram (ANTABUSE) 250 MG tablet Take 1 tablet (250 mg total) by mouth 2 (two) times daily. (Patient not taking: Reported on 04/16/2018) 60 tablet 1  . gabapentin (NEURONTIN) 100 MG capsule Take 1 capsule (100 mg total) by mouth 3 (three) times daily. (Patient not taking: Reported on 05/03/2018) 90 capsule 1  . omeprazole (PRILOSEC) 20 MG capsule Take 1 capsule (20 mg total) by mouth daily. (Patient not taking: Reported on 05/03/2018) 90 capsule 1  . ondansetron (ZOFRAN ODT) 4 MG disintegrating tablet 4mg69mT q4 hours prn nausea/vomit (Patient not taking: Reported on 05/03/2018) 10 tablet 0  . pantoprazole (PROTONIX) 20 MG tablet Take 1 tablet (20 mg total) by mouth 2 (two) times daily. (Patient  not taking: Reported on 03/29/2018) 30 tablet 0  . venlafaxine XR (EFFEXOR XR) 75 MG 24 hr capsule Take 1 capsule (75 mg total) by mouth daily. (Patient not taking: Reported on 03/29/2018) 30 capsule 1    Musculoskeletal: Strength & Muscle Tone:  within normal limits Gait & Station: normal Patient leans: N/A  Psychiatric Specialty Exam: Physical Exam  Nursing note and vitals reviewed. Constitutional: He is oriented to person, place, and time. He appears well-developed and well-nourished.  Cardiovascular: Normal rate.  Respiratory: Effort normal.  Musculoskeletal: Normal range of motion.  Neurological: He is alert and oriented to person, place, and time.    Review of Systems  Constitutional: Negative.   HENT: Negative.   Eyes: Negative.   Respiratory: Negative.   Cardiovascular: Negative.   Gastrointestinal: Negative.   Genitourinary: Negative.   Musculoskeletal: Negative.   Skin: Negative.   Neurological: Negative.   Endo/Heme/Allergies: Negative.   Psychiatric/Behavioral: Positive for depression, substance abuse and suicidal ideas. The patient is nervous/anxious.     Blood pressure (!) 145/85, pulse 93, temperature 98.7 F (37.1 C), temperature source Oral, resp. rate 16, height '5\' 9"'  (1.753 m), weight 59 kg (130 lb), SpO2 97 %.Body mass index is 19.2 kg/m.  General Appearance: Casual  Eye Contact:  Good  Speech:  Clear and Coherent and Normal Rate  Volume:  Normal  Mood:  Depressed  Affect:  Depressed  Thought Process:  Linear and Descriptions of Associations: Intact  Orientation:  Full (Time, Place, and Person)  Thought Content:  WDL  Suicidal Thoughts:  Yes.  without intent/plan  Homicidal Thoughts:  No  Memory:  Immediate;   Fair Recent;   Fair Remote;   Fair  Judgement:  Fair  Insight:  Lacking  Psychomotor Activity:  Normal  Concentration:  Concentration: Good and Attention Span: Good  Recall:  Good  Fund of Knowledge:  Fair  Language:  Good  Akathisia:  No  Handed:  Right  AIMS (if indicated):     Assets:  Communication Skills Desire for Improvement Resilience  ADL's:  Intact  Cognition:  WNL  Sleep:        Treatment Plan Summary: Daily contact with patient to assess and evaluate  symptoms and progress in treatment, Medication management and Plan is to : -Start patient on detox protocol -Admit patient to [facility] for detox and treatment -CSW to assist with residential rehab placement  Disposition: Recommend psychiatric Inpatient admission when medically cleared.  This service was provided via telemedicine using a 2-way, interactive audio and video technology.  Names of all persons participating in this telemedicine service and their role in this encounter. Name: [name] [name] Role: Patient  Name: Darnelle Maffucci Money Role: FNP-C  Name:  Role:   Name:  Role:     Lewis Shock, FNP 05/04/2018 8:37 AM

## 2018-05-04 NOTE — ED Notes (Signed)
Telepsych being performed. 

## 2018-05-04 NOTE — ED Notes (Signed)
ALL belongings - 1 labeled belongings bag, 1 black backpack, and 1 valuables envelope - [place] - Pt aware.

## 2018-05-04 NOTE — ED Notes (Signed)
Pt had his personal pants w/wallet w/debit cards, $0.82 in change, keys, and cell phone in the bed w/him. Pt noted to be wearing burgundy scrubs. All personal items removed, inventoried, and pt was wanded by [employer]. Pt voiced understanding. Pants w/wallet placed in pt's labeled belongings bag in [place] [facility] - Valuables Envelope - signed by pt - verifying all items inventoried correctly - given to Security.

## 2018-05-04 NOTE — Plan of Care (Signed)
  Problem: Education: Goal: Knowledge of [facility] Education information/materials will improve Outcome: Progressing Goal: Verbalization of understanding the information provided will improve Outcome: Progressing   

## 2018-05-05 DIAGNOSIS — R450 Nervousness: Secondary | ICD-10-CM

## 2018-05-05 DIAGNOSIS — G4700 Insomnia, unspecified: Secondary | ICD-10-CM

## 2018-05-05 DIAGNOSIS — F1010 Alcohol abuse, uncomplicated: Secondary | ICD-10-CM

## 2018-05-05 LAB — TSH: TSH: 1.403 u[IU]/mL (ref 0.350–4.500)

## 2018-05-05 MED ORDER — ESCITALOPRAM OXALATE 10 MG PO TABS
10.0000 mg | ORAL_TABLET | Freq: Every day | ORAL | Status: DC
  Administered 2018-05-05 – 2018-05-07 (×3): 10 mg via ORAL
  Filled 2018-05-05 (×2): qty 1
  Filled 2018-05-05: qty 14
  Filled 2018-05-05 (×3): qty 1

## 2018-05-05 MED ORDER — TRAZODONE HCL 50 MG PO TABS
50.0000 mg | ORAL_TABLET | Freq: Every day | ORAL | Status: DC
  Administered 2018-05-05: 50 mg via ORAL
  Filled 2018-05-05 (×3): qty 1

## 2018-05-05 NOTE — Progress Notes (Signed)
Patient did attend the evening speaker AA meeting.  

## 2018-05-05 NOTE — B[facility] Counselor (Signed)
Adult Comprehensive Assessment  Patient ID: [name] [name], male   DOB: [birth year], 57 y.o.   MRN: [id number]  Information Source: Information source: Patient  Current Stressors:  Patient states their primary concerns and needs for treatment are:: Feels he needs medication and since he has lost everything in his life, is suicidal, so needs to get stabilized. Patient states their goals for this hospitilization and ongoing recovery are:: Medication, stability, regaining hope.  States he wants this to be his very last time in treatment, wants to do whatever he is told to do to regain his life.  "It's totally destroyed my life." Educational / Learning stressors: Has a learning disability, writing and spelling.  Was always in special education from 2nd grade to 12 graduate.  Graduated with a diploma. Employment / Job issues: Unemployed for 3 weeks-1 month, was fired because of alcohol-related issues. Family Relationships: Family is not supportive, "tired of the mental illness and the alcohol.  I'm not really blaming them."  They have some of his possessions, and he does not know if they will keep them, or take them to [facility]. Financial / Lack of resources (include bankruptcy): Has some of his belongings in a storage facility, and cannot pay them, will lose them if he does not pay by the end of the month. Housing / Lack of housing: Has been homeless about 2 months, living in the woods.  Initially lived with parents, then aunt, but then was asked to leave because of his drinking. Physical health (include injuries & life threatening diseases): Alcohol is causing physical problems Social relationships: Has been separated over 10 years.  No social supports. Substance abuse: Alcohol has caused him to lose everything in his life. Bereavement / Loss: Was very close to grandmother (father's mother), and he has not gotten over her death.  If she was still around, at least he would have a place to  live.  Living/Environment/Situation:  Living Arrangements: Other (Comment) Living conditions (as described by patient or guardian): Woods, sleeping up against a tree, or whatever he can.  Very difficult when he has to figure out what to do during rain. Who else lives in the home?: Nobody How long has patient lived in current situation?: 2 months What is atmosphere in current home: Chaotic, Dangerous, Temporary  Family History:  Marital status: Separated Separated, when?: "10 years or a little over What types of issues is patient dealing with in the relationship?: "She is in [place] and honestly my drinking is an issue."  They still stay in touch. Additional relationship information: NA Are you sexually active?: No What is your sexual orientation?: Heterosexual; my wife really  Has your sexual activity been affected by drugs, alcohol, medication, or emotional stress?: "Maybe" Does patient have children?: Yes How many children?: 2 How is patient's relationship with their children?: Good relationship with my 38yo and 42yo adult sons in [place].  "But they are getting tired of my relapsing."  Childhood History:  By whom was/is the patient raised?: Both parents Additional childhood history information: My parents were married when I was a child. "I never wanted for anything. They were hardworking and were wonderful parents." Description of patient's relationship with caregiver when they were a child: I was very close to my parents as a child. We are a very close family. Patient's description of current relationship with people who raised him/her: Not good with either parent right now due to his alcohol usage. How were you disciplined when you got in trouble  as a child/adolescent?: 'Never was much of a need for it' Does patient have siblings?: Yes Number of Siblings: 2 Description of patient's current relationship with siblings: 1 older and 1 younger sister, decent relationship, but at one  time at total support.  Alcohol has affect it.  1 in [place] and 1 in [place]. Did patient suffer any verbal/emotional/physical/sexual abuse as a child?: No Did patient suffer from severe childhood neglect?: No Has patient ever been sexually abused/assaulted/raped as an adolescent or adult?: No Was the patient ever a victim of a crime or a disaster?: No Witnessed domestic violence?: No Has patient been effected by domestic violence as an adult?: No  Education:  Highest grade of school patient has completed: Some college, got a scholarship to go to [employer], but due to his learning problems he could not stay. 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  Did You Receive Any Psychiatric Treatment/Services While in the [place]?: No Are There Guns or Other Weapons in [place]?: No  Financial Resources:   Financial resources: No income(No income, has the [employer] program that will run out soon) Does patient have a representative payee or guardian?: No  Alcohol/Substance Abuse:   What has been your use of drugs/alcohol within the last 12 months?: Daily alcohol use, severe Alcohol/Substance Abuse Treatment Hx: Past Tx, Inpatient, Past detox, Past Tx, Outpatient If yes, describe treatment: [facility] once before for detox, once at [facility], [place], [place] years ago, [facility], numerous places while living in [place] for detox Has alcohol/substance abuse ever caused legal problems?: Yes  Social Support System:   [employer] Support System: Poor Describe Community Support System: Family to a limited extent Type of faith/religion:  Baptist How does patient's faith help to cope with current illness?: "Definitely believe in my God and if it wasn't for Him, I wouldn't be sitting here right now.  Right now He is my hope."  Leisure/Recreation:   Leisure and Hobbies: I love working with my hands, wood working, Biomedical scientist, anything outside.   Strengths/Needs:   What is the patient's perception of their strengths?: Cares about people, loving father, very hard worker, enjoy helping people, was told by a teacher that nobody could really tell he had a learning disability but has always minimized his abilities and been anxious because of the learning problems Patient states they can use these personal strengths during their treatment to contribute to their recovery: He believes he will always be an alcoholic, but does not have to live an alcoholic's life.  He believes he can be a successful person. Patient states these barriers may affect/interfere with their treatment: He really, really wants this to be his last time in treatment.   Patient states these barriers may affect their return to the community: Homeless, jobless, nowhere to go to. Other important information patient would like considered in planning for their treatment: Has not had his anxiety medicine (Lexapro) since being in the hospital, would like to get it.  Discharge Plan:   Currently receiving community mental health services: No Patient states concerns and preferences for aftercare planning are: Not sure where to go from here, may be willing to go to rehab.  The longest period of time he had sober after treatment was [facility].  Does not want to go to [facility] because the last time he was there, he was very stressed out by them making him do daily written assignments since he cannot write. Patient states they will know when they are safe and ready for discharge when: "When I'm more clear headed and have my plan ready." Does patient have access to  transportation?: No Does patient have financial barriers related to discharge medications?: Yes Patient description of barriers related to discharge medications: No income, no insurance - does have the [employer] and has [place] assistance but it will run out soon. Plan for no access to transportation at discharge: Will need to be assessed by Education officer, museum. Plan for living situation after discharge: "I definitely need somewhere to stay." Will patient be returning to same living situation after discharge?: No  Summary/Recommendations:   Summary and Recommendations (to be completed by the evaluator): Patient is a 57yo male admitted voluntarily with increasing suicidal ideation, stating "If I had a gun, I would not be here."  Primary stressors include homelessness, unemployment, having no supports, alcohol abuse, and depression.  He states that alcohol has made him lose everything in his life and he wants this to be his last time in treatment.  Patient will benefit from crisis stabilization, medication evaluation, group therapy and psychoeducation, in addition to case management for discharge planning. At discharge it is recommended that Patient adhere to the established discharge plan and continue in treatment.  Maretta Los. 05/05/2018

## 2018-05-05 NOTE — Progress Notes (Signed)
[facility] MD Progress Note  05/05/2018 11:47 AM [name] [name]  MRN:  [id number]    Subjective: [name] [name] seen standing in bedroom.  He is awake alert and oriented x3. Jordon presents pleasant, clam and cooperative.  Patient is requesting to be restarted on Lexapro for his depression and is requesting something to help him sleep during the night.  Reports "my biggest stressor is my drinking addiction."  States he has attended [place] meetings in the past.  Denies any suicidal homicidal during this assessment.  Patient was initiated on the detox protocol.  Denies headache, nausea vomiting or tremors.  NP discussed initiating Lexapro 10 mg and trazodone 50 milligrams p.o. Nightly. Aran was agreeable to treatment plan.  Patient encouraged to attend group sessions.  Reports a good appetite.  Support encouragement reassurance was provided.  History: per assessment note-57 y.o.malewho presents to the ED voluntarily.Pt reports he has been increasingly depressed, hopeless, and contemplating suicide. Pt stated "if I had a gun, I would not be here." Pt states he is homeless and sleeps in the woods. Pt states he has been drinking alcohol in excess for years and he has no support. Pt states he would rather die than to continue living this way. Pt has attended [facility] in the past for SA treatment and states he was able to stay sober for 1 year after SA treatment. Pt states he feels like God is punishing him and he continues to ask ""why am I alive, what is the point, why am I going through all this hell?" Pt's current BAL is 367. Pt has a hx of ED visits c/o alcohol dependence. Pt's most recent TTS assessment occurred on 03/29/18 c/o similar concerns related to alcohol abuse and hopelessness. Pt states he does not have a current provider and has no family support. Pt is unable to contract for safety at this time and states if he is d/c he will try to harm himself.      Principal Problem: MDD (major depressive disorder), recurrent  episode, severe ([place]) Diagnosis:   Patient Active Problem List   Diagnosis Date Noted  . MDD (major depressive disorder), recurrent episode, severe ([place]) [F33.2] 06/30/2017  . Lateral epicondylitis of right elbow [M77.11] 01/05/2017  . Strain, MCP, hand, right, initial encounter [W09.811B] 01/05/2017  . 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 Bilateral knee pain [M25.561, M25.562] 01/11/2016  . Chronic pain of right wrist [M25.531, G89.29] 07/07/2015  . 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 Dislocation of right shoulder joint [S43.004A] 05/20/2015  . Tear of medial meniscus of right knee [S83.241A] 03/24/2015  . 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 Chronic cough [R05] 01/15/2015  . Onychomycosis of toenail [B35.1] 12/15/2014  . Seborrhea [L21.9] 11/11/2014  . GERD (gastroesophageal reflux disease) [K21.9]   . Alcohol abuse [F10.10] 01/19/2014  . Alcohol dependence ([place]) [F10.20] 09/02/2013  . Generalized anxiety disorder [F41.1] 09/02/2013   Total Time spent with patient: 20 minutes  Past Psychiatric History:   Past Medical History:  Past Medical History:  Diagnosis Date  . Alcohol abuse    Last Drink on Sep 2018  . Anxiety    About 57 years of age  . Carpal tunnel syndrome of right wrist Dx 2015  . Depression    At 57 years of age  . GERD (gastroesophageal reflux disease) Dx 2007  . Hepatitis, alcoholic, acute 1478  . Hyperlipidemia    borderline, diet controlled  . Pancreatitis Dx 2014  . Pneumonia 11/2014  .  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 Reflux Dx 2007  . 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 carpel tunnel release Right 07/28/2014    done in [place]   . ELBOW SURGERY    . KNEE ARTHROSCOPY    . KNEE SURGERY Left    15 years ago  . LIPOMA EXCISION N/A 05/10/2016   Procedure: EXCISION OF SCALP LIPOMA;  Surgeon: Johnathan Hausen, MD;  Location: [facility];  Service: General;  Laterality: N/A;   Family History:  Family History  Problem Relation Age of Onset  . Diabetes Father   . Hypertension Father   . Hypertension Paternal Grandfather   . Alcohol abuse Paternal Grandfather   . Alcohol abuse Maternal Grandfather   . Alcohol abuse Maternal Grandmother   . Alcohol abuse Paternal Grandmother   . Colon cancer Neg Hx   . Rectal cancer Neg Hx   . Stomach cancer Neg Hx   . Bipolar disorder Neg Hx   . Depression Neg Hx    Family Psychiatric  History:  Social History:  Social History   Substance and Sexual Activity  Alcohol Use Yes  . Alcohol/week: 0.0 oz  . Frequency: Never   Comment: last use today 05/03/18      Social History   Substance and Sexual Activity  Drug Use Not Currently  . Types: Marijuana   Comment: Patient denies     Social History   Socioeconomic History  . Marital status: Legally Separated    Spouse name: Not on file  . Number of children: 2  . Years of education: 13 yrs  . Highest education level: Not on file  Occupational History  . Occupation: custodian    Comment: [employer]  Social Needs  . Financial resource strain: Not on file  . Food insecurity:    Worry: Not on file    Inability: Not on file  . Transportation needs:    Medical: Not on file    Non-medical: Not on file  Tobacco Use  . Smoking status: Never Smoker  . Smokeless tobacco: Never Used  Substance and Sexual Activity  . Alcohol use: Yes    Alcohol/week: 0.0 oz    Frequency: Never    Comment: last use today 05/03/18   . Drug use: Not Currently    Types: Marijuana    Comment: Patient denies   . Sexual activity: Not on file  Lifestyle  . Physical activity:    Days per week: Not on file    Minutes per session: Not on file  . Stress: Not on file  Relationships  . Social connections:    Talks on phone: Not on file    Gets together: Not on file    Attends religious service: Not on file    Active  member of club or organization: Not on file    Attends meetings of clubs or organizations: Not on file    Relationship status: Not on file  Other Topics Concern  . Not on file  Social History Narrative   Born and raised in [place], [place] by both parents. He has 2 sisters and is the middle child. He remains close with family. He was married for 16 yrs and seperated in 2005. He has 2 boys who are in their 20's now. He lives alone in [place]. He is working as a Tax adviser Social History:  Sleep: Fair  Appetite:  Fair  Current Medications: Current Facility-Administered Medications  Medication Dose Route Frequency Provider Last Rate Last Dose  . acetaminophen (TYLENOL) tablet 650 mg  650 mg Oral Q6H PRN Money, Lowry Ram, FNP      . alum & mag hydroxide-simeth (MAALOX/MYLANTA) 200-200-20 MG/5ML suspension 30 mL  30 mL Oral Q4H PRN Money, Lowry Ram, FNP      . chlordiazePOXIDE (LIBRIUM) capsule 25 mg  25 mg Oral Q6H PRN Money, Lowry Ram, FNP      . chlordiazePOXIDE (LIBRIUM) capsule 25 mg  25 mg Oral TID Money, Lowry Ram, FNP   25 mg at 05/05/18 0831   Followed by  . [START ON 05/06/2018] chlordiazePOXIDE (LIBRIUM) capsule 25 mg  25 mg Oral BH-qamhs Money, Lowry Ram, FNP       Followed by  . [START ON 05/07/2018] chlordiazePOXIDE (LIBRIUM) capsule 25 mg  25 mg Oral Daily Money, Darnelle Maffucci B, FNP      . escitalopram (LEXAPRO) tablet 10 mg  10 mg Oral Daily Jamaal Bernasconi, Ludger Nutting N, NP      . feeding supplement (ENSURE ENLIVE) (ENSURE ENLIVE) liquid 237 mL  237 mL Oral BID BM Money, Travis B, FNP   237 mL at 05/05/18 1114  . hydrOXYzine (ATARAX/VISTARIL) tablet 25 mg  25 mg Oral Q6H PRN Money, Lowry Ram, FNP   25 mg at 05/04/18 1302  . hydrOXYzine (ATARAX/VISTARIL) tablet 25 mg  25 mg Oral TID PRN Money, Lowry Ram, FNP      . loperamide (IMODIUM) capsule 2-4 mg  2-4 mg Oral PRN Money, Lowry Ram, FNP      . magnesium hydroxide (MILK OF MAGNESIA) suspension 30 mL  30 mL Oral Daily  PRN Money, Darnelle Maffucci B, FNP   30 mL at 05/04/18 2250  . multivitamin with minerals tablet 1 tablet  1 tablet Oral Daily Money, Lowry Ram, FNP   1 tablet at 05/05/18 0830  . ondansetron (ZOFRAN-ODT) disintegrating tablet 4 mg  4 mg Oral Q6H PRN Money, Lowry Ram, FNP      . thiamine (VITAMIN B-1) tablet 100 mg  100 mg Oral Daily Money, Travis B, FNP   100 mg at 05/05/18 0830  . traZODone (DESYREL) tablet 50 mg  50 mg Oral QHS Derrill Center, NP        Lab Results:  Results for orders placed or performed during the hospital encounter of 05/04/18 (from the past 48 hour(s))  TSH     Status: None   Collection Time: 05/05/18  6:31 AM  Result Value Ref Range   TSH 1.403 0.350 - 4.500 uIU/mL    Comment: Performed by a 3rd Generation assay with a functional sensitivity of <=0.01 uIU/mL. Performed at [facility], [place] [address] [place], [place] [address]     Blood Alcohol level:  Lab Results  Component Value Date   ETH 367 [facility]) 05/03/2018   ETH <10 19/62/2297    Metabolic Disorder Labs: Lab Results  Component Value Date   HGBA1C 6.0 10/22/2017   MPG 120 06/30/2017   No results found for: PROLACTIN Lab Results  Component Value Date   CHOL 330 (H) 06/30/2017   TRIG 61 06/30/2017   HDL 196 06/30/2017   CHOLHDL 1.7 06/30/2017   VLDL 12 06/30/2017   LDLCALC 122 (H) 06/30/2017   LDLCALC 128 (H) 12/15/2014    Physical Findings: AIMS:  , ,  ,  ,    CIWA:  CIWA-Ar Total: 1 COWS:  Musculoskeletal: Strength & Muscle Tone: within normal limits Gait & Station: normal Patient leans: N/A  Psychiatric Specialty Exam: Physical Exam  Vitals reviewed. Constitutional: He is oriented to person, place, and time. He appears well-developed.  Cardiovascular: Normal rate.  Neurological: He is alert and oriented to person, place, and time.  Psychiatric: He has a normal mood and affect. His behavior is normal.    Review of Systems  Psychiatric/Behavioral: Positive for  depression and substance abuse. Negative for suicidal ideas. The patient is nervous/anxious.   All other systems reviewed and are negative.   Blood pressure (!) 138/98, pulse 84, temperature 98.3 F (36.8 C), temperature source Oral, resp. rate 18, height 5\' 9"  (1.753 m), weight 65.8 kg (145 lb), SpO2 98 %.Body mass index is 21.41 kg/m.  General Appearance: Casual  Eye Contact:  Fair  Speech:  Clear and Coherent  Volume:  Normal  Mood:  Anxious  Affect:  Depressed and Flat  Thought Process:  Coherent  Orientation:  Full (Time, Place, and Person)  Thought Content:  Hallucinations: None  Suicidal Thoughts:  No patient able to contract for safety while on the unit  Homicidal Thoughts:  No  Memory:  Immediate;   Fair Recent;   Fair Remote;   Fair  Judgement:  Fair  Insight:  Fair  Psychomotor Activity:  Normal  Concentration:  Concentration: Fair  Recall:  [employer] of Knowledge:  Fair  Language:  Fair  Akathisia:  No  Handed:  Right  AIMS (if indicated):     Assets:  Communication Skills Desire for Improvement Resilience Social Support  ADL's:  Intact  Cognition:  WNL  Sleep:  Number of Hours: 5.5     Treatment Plan Summary: Daily contact with patient to assess and evaluate symptoms and progress in treatment and Medication management   Continue with current treatment plan on 05/05/2018 as listed below except were noted  Restarted Lexapro 10 mg p.o. daily mood stabilization. Initiated with Trazodone 100 mg for insomnia Continue on CWIA/ Librium Protocol Will continue to monitor vitals ,medication compliance and treatment side effects while patient is here.   CSW will  continue working on disposition.  Patient to participate in therapeutic milieu  Derrill Center, NP 05/05/2018, 11:47 AM

## 2018-05-05 NOTE — Progress Notes (Signed)
[facility] Group Notes:  (Nursing/MHT/Case Management/Adjunct)  Date:  05/05/2018  Time:  2:38 PM  Type of Therapy:  Nurse Education  Participation Level:  Active  Participation Quality:  Appropriate, Sharing and Supportive  Affect:  Appropriate  Cognitive:  Alert, Appropriate and Oriented  Insight:  Appropriate and Good  Engagement in Group:  Engaged and Supportive  Modes of Intervention:  Activity, Discussion and Socialization  Summary of Progress/Problems: Patient participated appropriately and had good information to share with the group.  Megan Mans 05/05/2018, 2:38 PM

## 2018-05-05 NOTE — Progress Notes (Signed)
Nutrition Brief Note  Patient identified on the Malnutrition Screening Tool (MST) Report  Weight is stable as weight tends to fluctuate between 130-155 lb. Will continue Ensure supplement order given ETOH use PTA.  Wt Readings from Last 15 Encounters:  05/04/18 145 lb (65.8 kg)  05/03/18 130 lb (59 kg)  04/16/18 130 lb (59 kg)  02/27/18 150 lb (68 kg)  02/07/18 147 lb (66.7 kg)  01/05/18 154 lb (69.9 kg)  12/27/17 146 lb 14.4 oz (66.6 kg)  12/19/17 154 lb 6.4 oz (70 kg)  12/05/17 155 lb (70.3 kg)  11/12/17 160 lb 12.8 oz (72.9 kg)  10/22/17 158 lb 12.8 oz (72 kg)  08/09/17 135 lb (61.2 kg)  06/29/17 138 lb (62.6 kg)  06/18/17 142 lb 12.8 oz (64.8 kg)  04/25/17 150 lb (68 kg)    Body mass index is 21.41 kg/m. Patient meets criteria for normal based on current BMI.   Labs and medications reviewed.   No nutrition interventions warranted at this time. If nutrition issues arise, please consult RD.   [name] Bibles, MS, RD, [place] Dietitian Pager: [phone] After Hours Pager: [phone]

## 2018-05-05 NOTE — [facility] Group Notes (Signed)
[facility] LCSW Group Therapy Note  05/05/2018  10:00-11:00AM  Type of Therapy and Topic:  Group Therapy:  Being Your Own Support  Participation Level:  Active   Description of Group:  Patients in this group were introduced to the concept that self-support is an essential part of recovery.  A song entitled "My Own Hero" was played and a group discussion ensued in which patients stated they could relate to the song and it inspired them to realize they have be willing to help themselves in order to succeed, because other people cannot achieve sobriety or stability for them.  We discussed adding a variety of healthy supports to address the various needs in their lives.  A song was played called "I Know Where I've Been" toward the end of group and used to conduct an inspirational wrap-up to group of remembering how far they have already come in their journey.  Therapeutic Goals: 1)  demonstrate the importance of being a part of one's own support system 2)  discuss reasons people in one's life may eventually be unable to be continually supportive  3)  identify the patient's current support system and   4)  elicit commitments to add healthy supports and to become more conscious of being self-supportive   Summary of Patient Progress:  The patient expressed that he has "messed up" his family support because they are tired of seeing him in places "like this."  He stated he has realized that he has not "lost" his family supports because they do still love him.  He intends to talk to them immediately following group to let them know he is in the hospital, stating that even if they are disappointed in him or angry with him, he still needs to set their minds at ease that he is safe.  He talked frequently throughout group with insight that was helpful to other patients.  He also talked about how important his faith is to him as a support.  Therapeutic Modalities:   Motivational Interviewing Activity  Maretta Los

## 2018-05-05 NOTE — Progress Notes (Addendum)
D. Pt presents with a sad affect- laying in bed upon initial approach- Pt pleasant and cooperative during interactions-reports improving withdrawal symptoms. Per pt's self inventory, pt rates his depression, hopelessness and anxiety a 5/7/8, respectively. Pt out of bed for meds and is attending am group- pt requesting to shave with supervision after group. Pt currently denies SI/HI and AV hallucinations. A. Labs and vitals monitored. Pt compliant with medications. Pt supported emotionally and encouraged to express concerns and ask questions.   R. Pt remains safe with 15 minute checks. Will continue POC.

## 2018-05-06 DIAGNOSIS — F419 Anxiety disorder, unspecified: Secondary | ICD-10-CM

## 2018-05-06 DIAGNOSIS — F819 Developmental disorder of scholastic skills, unspecified: Secondary | ICD-10-CM

## 2018-05-06 MED ORDER — HYDROXYZINE HCL 25 MG PO TABS: 25.0000 mg | ORAL_TABLET | Freq: Four times a day (QID) | ORAL | 0 refills | Status: DC | PRN

## 2018-05-06 MED ORDER — TRAZODONE HCL 100 MG PO TABS
100.0000 mg | ORAL_TABLET | Freq: Every day | ORAL | Status: DC
  Filled 2018-05-06: qty 14
  Filled 2018-05-06 (×2): qty 1
  Filled 2018-05-06: qty 14
  Filled 2018-05-06: qty 1

## 2018-05-06 MED ORDER — ESCITALOPRAM OXALATE 10 MG PO TABS: 10.0000 mg | ORAL_TABLET | Freq: Every day | ORAL | 0 refills | Status: DC

## 2018-05-06 MED ORDER — TRAZODONE HCL 100 MG PO TABS: 100.0000 mg | ORAL_TABLET | Freq: Every day | ORAL | 0 refills | Status: DC

## 2018-05-06 NOTE — Progress Notes (Signed)
D:  [name] [name] has been up and visible on the unit.  He stated that he had a better day today.  He denied SI/HI or A/V hallucinations.  No withdrawal symptoms noted.  He attended evening group.  He was debating on trying to sleep without the trazodone tonight but decided that he would take it.  No PRN medications needed tonight.  He did report that the MOM given last night finally worked around supper time and that really helped. A:  1:1 with RN for support and encouragement.  Medications as ordered.  Q 15 minute checks maintained for safety.  Encouraged participation in group and unit activities.   R:  He remains safe on the unit.  We will continue to monitor the progress towards his goals.

## 2018-05-06 NOTE — [facility] Suicide Risk Assessment (Signed)
[facility] Discharge Suicide Risk Assessment   Principal Problem: MDD (major depressive disorder), recurrent episode, severe ([place]) Discharge Diagnoses:  Patient Active Problem List   Diagnosis Date Noted  . MDD (major depressive disorder), recurrent episode, severe ([place]) [F33.2] 06/30/2017  . Lateral epicondylitis of right elbow [M77.11] 01/05/2017  . Strain, MCP, hand, right, initial encounter [O24.235T] 01/05/2017  . 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 Bilateral knee pain [M25.561, M25.562] 01/11/2016  . Chronic pain of right wrist [M25.531, G89.29] 07/07/2015  . 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 Dislocation of right shoulder joint [S43.004A] 05/20/2015  . Tear of medial meniscus of right knee [S83.241A] 03/24/2015  . 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 Chronic cough [R05] 01/15/2015  . Onychomycosis of toenail [B35.1] 12/15/2014  . Seborrhea [L21.9] 11/11/2014  . GERD (gastroesophageal reflux disease) [K21.9]   . Alcohol abuse [F10.10] 01/19/2014  . Alcohol dependence ([place]) [F10.20] 09/02/2013  . Generalized anxiety disorder [F41.1] 09/02/2013    Total Time spent with patient: 20 minutes  Musculoskeletal: Strength & Muscle Tone: within normal limits Gait & Station: normal Patient leans: N/A  Psychiatric Specialty Exam: Review of Systems  All other systems reviewed and are negative.   Blood pressure (!) 141/83, pulse 60, temperature 97.9 F (36.6 C), temperature source Oral, resp. rate 20, height 5\' 9"  (1.753 m), weight 65.8 kg (145 lb), SpO2 98 %.Body mass index is 21.41 kg/m.  General Appearance: Casual  Eye Contact::  Fair  Speech:  Normal Rate409  Volume:  Normal  Mood:  Anxious  Affect:  Appropriate  Thought Process:  Coherent  Orientation:  Full (Time, Place, and Person)  Thought Content:  Logical  Suicidal Thoughts:  No  Homicidal Thoughts:  No  Memory:  Immediate;   Fair Recent;   Fair Remote;   Fair  Judgement:   Intact  Insight:  Fair  Psychomotor Activity:  Normal  Concentration:  Fair  Recall:  [employer] of Knowledge:Good  Language: Fair  Akathisia:  Negative  Handed:  Right  AIMS (if indicated):     Assets:  Desire for Improvement Physical Health Resilience  Sleep:  Number of Hours: 4  Cognition: WNL  ADL's:  Intact   Mental Status Per Nursing Assessment::   On Admission:  Suicidal ideation indicated by patient  Demographic Factors:  Male, Low socioeconomic status, Living alone and Unemployed  Loss Factors: NA  Historical Factors: Impulsivity  Risk Reduction Factors:   Sense of responsibility to family  Continued Clinical Symptoms:  Alcohol/Substance Abuse/Dependencies  Cognitive Features That Contribute To Risk:  None    Suicide Risk:  Minimal: No identifiable suicidal ideation.  Patients presenting with no risk factors but with morbid ruminations; may be classified as minimal risk based on the severity of the depressive symptoms  Follow-up Information    [facility] Follow up on 05/14/2018.   Why:  Screening for possible admission on Tuesday, 05/07/18 at 8:00AM. Please bring: photo ID/proof of [place] residency, 30 day supply of all medications you are prescribed, and clothing. Thank you.  Contact information: [address] [place] [address] [phone]        [facility] Follow up.   Specialty:  Addiction Medicine Why:  Referral made: 05/06/18. No beds available at this time.  Contact information: [place] [address] [phone]        [facility] Follow up.   Why:  Please contact provider prior to leaving [facility] in order to schedule follow-up appt. Thank you.  Contact information: [place]          Plan Of Care/Follow-up recommendations:  Activity:  ad lib  Sharma Covert, MD 05/06/2018,  2:11 PM

## 2018-05-06 NOTE — [facility] Group Notes (Signed)
LCSW Group Therapy Note   05/06/2018 1:15pm   Type of Therapy and Topic:  Group Therapy:  Overcoming Obstacles   Participation Level:  Active   Description of Group:    In this group patients will be encouraged to explore what they see as obstacles to their own wellness and recovery. They will be guided to discuss their thoughts, feelings, and behaviors related to these obstacles. The group will process together ways to cope with barriers, with attention given to specific choices patients can make. Each patient will be challenged to identify changes they are motivated to make in order to overcome their obstacles. This group will be process-oriented, with patients participating in exploration of their own experiences as well as giving and receiving support and challenge from other group members.   Therapeutic Goals: 1. Patient will identify personal and current obstacles as they relate to admission. 2. Patient will identify barriers that currently interfere with their wellness or overcoming obstacles.  3. Patient will identify feelings, thought process and behaviors related to these barriers. 4. Patient will identify two changes they are willing to make to overcome these obstacles:      Summary of Patient Progress   [name] [name] was attentive and engaged during today's processing group. He shared that he is looking forward to going to [facility] on Tuesday morning and reports that "I am ready to put my whole self into this recovery program." [name] [name] continues to show progress in the group setting with improving insight.    Therapeutic Modalities:   Cognitive Behavioral Therapy Solution Focused Therapy Motivational Interviewing Relapse Prevention Therapy  Avelina Laine, LCSW 05/06/2018 3:16 PM

## 2018-05-06 NOTE — [facility] Group Notes (Addendum)
Group opened with brief discussion and psycho-social ed around grief and loss in relationships and in relation to self - identifying life patterns, circumstances, changes connected to losses. Established group norms and goals.  Group goal of establishing open and affirming space for members to process loss and experience with grief, normalize grief experience and provide psycho social education and grief support.. Group members engaged in facilitated dialog and support.  Engaged with Worden's four tasks of grief, identifying how these tasks show in their own experiences.    [name] [name] was present throughout group.  Alert and Oriented and actively engaged in group discussion voluntarily.  [name] [name] identified with several tasks of grief journey and related the topic of grief to his journey with recovery.  Stated he had not considered grief as a part of his experience and is encouraged to continue to find support around this.    [facility] / [facility] Chaplain Jerene Pitch, MDiv, [facility]

## 2018-05-06 NOTE — Progress Notes (Addendum)
  [facility] Adult Case Management Discharge Plan :  Will you be returning to the same living situation after discharge:  No. Pt plans to go to [facility] at discharge.  At discharge, do you have transportation home?: Yes,  taxi voucher in chart. PATIENT IS SCHEDULED FOR DISCHARGE VIA [facility]AXI AT 7AM ON TUESDAY, 05/07/18. Do you have the ability to pay for your medications: Yes,  mental health  Release of information consent forms completed and submitted to medical records by CSW.  Patient to Follow up at: Follow-up Information    [facility] Follow up on 05/14/2018.   Why:  Screening for possible admission on Tuesday, 05/07/18 at 8:00AM. Please bring: photo ID/proof of [place] residency, 30 day supply of all medications you are prescribed, and clothing. Thank you.  Contact information: [address] [place] [address] [phone]        [facility] Follow up.   Specialty:  Addiction Medicine Why:  Referral made: 05/06/18. No beds available at this time.  Contact information: [place] [address] [phone]        [facility] Follow up.   Why:  Please contact provider prior to leaving [facility] in order to schedule follow-up appt. Thank you.  Contact information: [address] [place]          Next level of care provider has access to [place] and Suicide Prevention discussed: Yes,  SPE completed with pt and his sister. SPI pamphlet and mobile crisis information provided to pt.   Have you used any form of tobacco in the last 30 days? (Cigarettes, Smokeless Tobacco, Cigars, and/or Pipes): No  Has patient been referred to the Quitline?: N/A patient is not a smoker  Patient has been referred for addiction treatment: Yes  Avelina Laine, LCSW 05/06/2018, 12:02 PM

## 2018-05-06 NOTE — [facility] Group Notes (Signed)
Adult Psychoeducational Group Note  Date:  05/06/2018 Time:  10:15 PM  Group Topic/Focus:  Wrap-Up Group:   The focus of this group is to help patients review their daily goal of treatment and discuss progress on daily workbooks.  Participation Level:  Active  Participation Quality:  Appropriate and Attentive  Affect:  Appropriate  Cognitive:  Alert and Appropriate  Insight: Appropriate and Good  Engagement in Group:  Engaged  Modes of Intervention:  Discussion and Education  Additional Comments:  Pt attended and participated in wrap up group this evening. Pt had a good day, due to them getting discharged tomorrow. Pt somewhat completed their goal of not thinking of the things that got them here.   Cristi Loron 05/06/2018, 10:15 PM

## 2018-05-06 NOTE — [facility] Suicide Risk Assessment (Signed)
[facility] INPATIENT:  Family/Significant Other Suicide Prevention Education  Suicide Prevention Education:  Education Completed; Donnamarie Poag (pt's sister) [phone] has been identified by the patient as the family member/significant other with whom the patient will be residing, and identified as the person(s) who will aid the patient in the event of a mental health crisis (suicidal ideations/suicide attempt).  With written consent from the patient, the family member/significant other has been provided the following suicide prevention education, prior to the and/or following the discharge of the patient.  The suicide prevention education provided includes the following:  Suicide risk factors  Suicide prevention and interventions  National Suicide Hotline telephone number  [facility] assessment telephone number  [facility] Emergency Assistance [place] and/or Residential Mobile Crisis Unit telephone number  Request made of family/significant other to:  Remove weapons (e.g., guns, rifles, knives), all items previously/currently identified as safety concern.    Remove drugs/medications (over-the-counter, prescriptions, illicit drugs), all items previously/currently identified as a safety concern.  The family member/significant other verbalizes understanding of the suicide prevention education information provided.  The family member/significant other agrees to remove the items of safety concern listed above.  Pt's sister made aware of aftercare plan-[facility] tomorrow morning. SPE completed. No concerns regarding pt discharging to [facility] for screening and possible admission. Pt's sister will bring pt clothes this evening to bring with him to treatment.   Avelina Laine LCSW 05/06/2018, 11:56 AM

## 2018-05-06 NOTE — Progress Notes (Signed)
[name] MD Progress Note  05/06/2018 12:26 PM [name] [name]  MRN:  [id number] Subjective: Patient is seen and examined.  Patient is a 57 year old male with a past psychiatric history significant for major depression, alcohol dependence, and learning disability.  He is seen in follow-up.  He is doing well.  He is a bit anxious today.  He found out that he does have a bed at [facility].  He will be discharged early tomorrow.  He denied any problems with depression, but is anxious over the fact that he has a learning disability in the last time he was at [facility] he had a hard time keeping up.  He said that he had spoken to social work about this, and that she was going to discuss with the facility to take into consideration this limitation.  Otherwise he denied any problems today. Principal Problem: MDD (major depressive disorder), recurrent episode, severe ([place]) Diagnosis:   Patient Active Problem List   Diagnosis Date Noted  . MDD (major depressive disorder), recurrent episode, severe ([place]) [F33.2] 06/30/2017  . Lateral epicondylitis of right elbow [M77.11] 01/05/2017  . Strain, MCP, hand, right, initial encounter [K24.097D] 01/05/2017  . 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 Bilateral knee pain [M25.561, M25.562] 01/11/2016  . Chronic pain of right wrist [M25.531, G89.29] 07/07/2015  . 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 Dislocation of right shoulder joint [S43.004A] 05/20/2015  . Tear of medial meniscus of right knee [S83.241A] 03/24/2015  . 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 Chronic cough [R05] 01/15/2015  . Onychomycosis of toenail [B35.1] 12/15/2014  . Seborrhea [L21.9] 11/11/2014  . GERD (gastroesophageal reflux disease) [K21.9]   . Alcohol abuse [F10.10] 01/19/2014  . Alcohol dependence ([place]) [F10.20] 09/02/2013  . Generalized anxiety disorder [F41.1] 09/02/2013   Total Time spent with patient: 20 minutes  Past Psychiatric History: See admission  H&P  Past Medical History:  Past Medical History:  Diagnosis Date  . Alcohol abuse    Last Drink on Sep 2018  . Anxiety    About 57 years of age  . Carpal tunnel syndrome of right wrist Dx 2015  . Depression    At 57 years of age  . GERD (gastroesophageal reflux disease) Dx 2007  . Hepatitis, alcoholic, acute 5329  . Hyperlipidemia    borderline, diet controlled  . Pancreatitis Dx 2014  . Pneumonia 11/2014  . 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 Reflux Dx 2007  . 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 carpel tunnel release Right 07/28/2014    done in [place]   . ELBOW SURGERY    . KNEE ARTHROSCOPY    . KNEE SURGERY Left    15 years ago  . LIPOMA EXCISION N/A 05/10/2016   Procedure: EXCISION OF SCALP LIPOMA;  Surgeon: Johnathan Hausen, MD;  Location: [place];  Service: General;  Laterality: N/A;   Family History:  Family History  Problem Relation Age of Onset  . Diabetes Father   . Hypertension Father   . Hypertension Paternal Grandfather   . Alcohol abuse Paternal Grandfather   . Alcohol abuse Maternal Grandfather   . Alcohol abuse Maternal Grandmother   . Alcohol abuse Paternal Grandmother   . Colon cancer Neg Hx   . Rectal cancer Neg Hx   . Stomach cancer Neg Hx   . Bipolar disorder Neg Hx   . Depression Neg Hx  Family Psychiatric  History: See admission H&P Social History:  Social History   Substance and Sexual Activity  Alcohol Use Yes  . Alcohol/week: 0.0 oz  . Frequency: Never   Comment: last use today 05/03/18      Social History   Substance and Sexual Activity  Drug Use Not Currently  . Types: Marijuana   Comment: Patient denies     Social History   Socioeconomic History  . Marital status: Legally Separated    Spouse name: Not on file  . Number of children: 2  . Years of education: 13 yrs  . Highest education level: Not on file  Occupational History   . Occupation: custodian    Comment: [employer]  Social Needs  . Financial resource strain: Not on file  . Food insecurity:    Worry: Not on file    Inability: Not on file  . Transportation needs:    Medical: Not on file    Non-medical: Not on file  Tobacco Use  . Smoking status: Never Smoker  . Smokeless tobacco: Never Used  Substance and Sexual Activity  . Alcohol use: Yes    Alcohol/week: 0.0 oz    Frequency: Never    Comment: last use today 05/03/18   . Drug use: Not Currently    Types: Marijuana    Comment: Patient denies   . Sexual activity: Not on file  Lifestyle  . Physical activity:    Days per week: Not on file    Minutes per session: Not on file  . Stress: Not on file  Relationships  . Social connections:    Talks on phone: Not on file    Gets together: Not on file    Attends religious service: Not on file    Active member of club or organization: Not on file    Attends meetings of clubs or organizations: Not on file    Relationship status: Not on file  Other Topics Concern  . Not on file  Social History Narrative   Born and raised in [place], [place] by both parents. He has 2 sisters and is the middle child. He remains close with family. He was married for 16 yrs and seperated in 2005. He has 2 boys who are in their 20's now. He lives alone in [place]. He is working as a Tax adviser Social History:                         Sleep: Good  Appetite:  Good  Current Medications: Current Facility-Administered Medications  Medication Dose Route Frequency Provider Last Rate Last Dose  . acetaminophen (TYLENOL) tablet 650 mg  650 mg Oral Q6H PRN Money, Lowry Ram, FNP      . alum & mag hydroxide-simeth (MAALOX/MYLANTA) 200-200-20 MG/5ML suspension 30 mL  30 mL Oral Q4H PRN Money, Darnelle Maffucci B, FNP   30 mL at 05/06/18 0841  . chlordiazePOXIDE (LIBRIUM) capsule 25 mg  25 mg Oral Q6H PRN Money, Lowry Ram, FNP      . chlordiazePOXIDE (LIBRIUM) capsule 25  mg  25 mg Oral BH-qamhs Money, Lowry Ram, FNP   25 mg at 05/06/18 1610   Followed by  . [START ON 05/07/2018] chlordiazePOXIDE (LIBRIUM) capsule 25 mg  25 mg Oral Daily Money, Travis B, FNP      . escitalopram (LEXAPRO) tablet 10 mg  10 mg Oral Daily Derrill Center, NP   10 mg at 05/06/18 9604  .  feeding supplement (ENSURE ENLIVE) (ENSURE ENLIVE) liquid 237 mL  237 mL Oral BID BM Money, Darnelle Maffucci B, FNP   237 mL at 05/06/18 0840  . hydrOXYzine (ATARAX/VISTARIL) tablet 25 mg  25 mg Oral Q6H PRN Money, Lowry Ram, FNP   25 mg at 05/04/18 1302  . hydrOXYzine (ATARAX/VISTARIL) tablet 25 mg  25 mg Oral TID PRN Money, Lowry Ram, FNP      . loperamide (IMODIUM) capsule 2-4 mg  2-4 mg Oral PRN Money, Lowry Ram, FNP      . magnesium hydroxide (MILK OF MAGNESIA) suspension 30 mL  30 mL Oral Daily PRN Money, Darnelle Maffucci B, FNP   30 mL at 05/04/18 2250  . multivitamin with minerals tablet 1 tablet  1 tablet Oral Daily Money, Lowry Ram, FNP   1 tablet at 05/06/18 [phone]  . ondansetron (ZOFRAN-ODT) disintegrating tablet 4 mg  4 mg Oral Q6H PRN Money, Lowry Ram, FNP      . thiamine (VITAMIN B-1) tablet 100 mg  100 mg Oral Daily Money, Lowry Ram, FNP   100 mg at 05/06/18 0839  . traZODone (DESYREL) tablet 50 mg  50 mg Oral QHS Derrill Center, NP   50 mg at 05/05/18 2200    Lab Results:  Results for orders placed or performed during the hospital encounter of 05/04/18 (from the past 48 hour(s))  TSH     Status: None   Collection Time: 05/05/18  6:31 AM  Result Value Ref Range   TSH 1.403 0.350 - 4.500 uIU/mL    Comment: Performed by a 3rd Generation assay with a functional sensitivity of <=0.01 uIU/mL. Performed at [facility], [place] [address], [place], [place] [address]     Blood Alcohol level:  Lab Results  Component Value Date   ETH 367 [facility]) 05/03/2018   ETH <10 97/35/3299    Metabolic Disorder Labs: Lab Results  Component Value Date   HGBA1C 6.0 10/22/2017   MPG 120 06/30/2017   No results  found for: PROLACTIN Lab Results  Component Value Date   CHOL 330 (H) 06/30/2017   TRIG 61 06/30/2017   HDL 196 06/30/2017   CHOLHDL 1.7 06/30/2017   VLDL 12 06/30/2017   LDLCALC 122 (H) 06/30/2017   LDLCALC 128 (H) 12/15/2014    Physical Findings: AIMS:  , ,  ,  ,    CIWA:  CIWA-Ar Total: 0 COWS:     Musculoskeletal: Strength & Muscle Tone: within normal limits Gait & Station: normal Patient leans: N/A  Psychiatric Specialty Exam: Physical Exam  Nursing note and vitals reviewed. Constitutional: He is oriented to person, place, and time. He appears well-developed and well-nourished.  HENT:  Head: Normocephalic and atraumatic.  Respiratory: Effort normal.  Neurological: He is alert and oriented to person, place, and time.    ROS  Blood pressure (!) 141/83, pulse 60, temperature 97.9 F (36.6 C), temperature source Oral, resp. rate 20, height 5\' 9"  (1.753 m), weight 65.8 kg (145 lb), SpO2 98 %.Body mass index is 21.41 kg/m.  General Appearance: Casual  Eye Contact:  Fair  Speech:  Normal Rate  Volume:  Normal  Mood:  Anxious  Affect:  Congruent  Thought Process:  Coherent  Orientation:  Full (Time, Place, and Person)  Thought Content:  Logical  Suicidal Thoughts:  No  Homicidal Thoughts:  No  Memory:  Immediate;   Fair Recent;   Fair Remote;   Fair  Judgement:  Intact  Insight:  Fair  Psychomotor Activity:  Increased  Concentration:  Concentration: Fair and Attention Span: Fair  Recall:  [employer] of Knowledge:  Good  Language:  Fair  Akathisia:  Negative  Handed:  Right  AIMS (if indicated):     Assets:  Desire for Improvement Physical Health Resilience  ADL's:  Intact  Cognition:  WNL  Sleep:  Number of Hours: 4     Treatment Plan Summary: Daily contact with patient to assess and evaluate symptoms and progress in treatment, Medication management and Plan Patient is seen and examined.  Patient is a 57 year old male with the above-stated past  psychiatric history seen in follow-up.  He has been accepted at [facility] into their residential program.  We will put everything in place for him to be able to be discharged early tomorrow in the a.m.  I am going to increase his trazodone 200 mg p.o. nightly tonight because of some difficulty with sleep.  I will also discuss with social work whether or not she is corresponded with [facility] over his learning disability.  Sharma Covert, MD 05/06/2018, 12:26 PM

## 2018-05-06 NOTE — Plan of Care (Signed)
  Problem: Activity: Goal: Interest or engagement in activities will improve Outcome: Progressing   Problem: Coping: Goal: Ability to verbalize frustrations and anger appropriately will improve Outcome: Progressing   Problem: Safety: Goal: Periods of time without injury will increase Outcome: Progressing   DAR NOTE: Patient presents with anxious affect and pleasant mood. Pt has been visible in the day room with peers.  Pt reports good sleep, fair appetite, normal energy, and good concentration. Denies pain, auditory and visual hallucinations.  Rates depression at 6, hopelessness at 1, and anxiety at 6.  Maintained on routine safety checks.  Medications given as prescribed.  Support and encouragement offered as needed.  Attended group and participated.  States goal for today is " stay focus on what I need to do to get better."Patient observed socializing with peers in the dayroom.  Offered no complaint.

## 2018-05-06 NOTE — Progress Notes (Signed)
Recreation Therapy Notes  Date:  6.10.19  Time: 0930 Location: 300 Hall Dayroom  Group Topic: Stress Management  Goal Area(s) Addresses:  Patient will verbalize importance of using healthy stress management.  Patient will identify positive emotions associated with healthy stress management.   Behavioral Response: Engaged  Intervention: Stress Management  Activity :  Guided Imagery.  LRT introduced patients to the stress management technique of guided imagery.  LRT read a script that allowed patients to take a mental vacation to the beach.  Patients were to follow along as script was read to engage in the activity.  Education:  Stress Management, Discharge Planning.   Education Outcome: Acknowledges edcuation/In group clarification offered/Needs additional education  Clinical Observations/Feedback: Pt attended and participated in group.     Victorino Sparrow, LRT/CTRS         Ria Comment, Marjette A 05/06/2018 11:03 AM

## 2018-05-06 NOTE — Tx Team (Signed)
Interdisciplinary Treatment and Diagnostic Plan Update  05/06/2018 Time of Session: [id number] [name] [name] MRN: [id number]  Principal Diagnosis: MDD (major depressive disorder), recurrent episode, severe ([place])  Secondary Diagnoses: Principal Problem:   MDD (major depressive disorder), recurrent episode, severe ([place]) Active Problems:   Alcohol dependence ([place])   Alcohol abuse   Current Medications:  Current Facility-Administered Medications  Medication Dose Route Frequency Provider Last Rate Last Dose  . acetaminophen (TYLENOL) tablet 650 mg  650 mg Oral Q6H PRN Money, Lowry Ram, FNP      . alum & mag hydroxide-simeth (MAALOX/MYLANTA) 200-200-20 MG/5ML suspension 30 mL  30 mL Oral Q4H PRN Money, Darnelle Maffucci B, FNP   30 mL at 05/06/18 0841  . chlordiazePOXIDE (LIBRIUM) capsule 25 mg  25 mg Oral Q6H PRN Money, Lowry Ram, FNP      . chlordiazePOXIDE (LIBRIUM) capsule 25 mg  25 mg Oral BH-qamhs Money, Lowry Ram, FNP   25 mg at 05/06/18 3235   Followed by  . [START ON 05/07/2018] chlordiazePOXIDE (LIBRIUM) capsule 25 mg  25 mg Oral Daily Money, Travis B, FNP      . escitalopram (LEXAPRO) tablet 10 mg  10 mg Oral Daily Derrill Center, NP   10 mg at 05/06/18 5732  . feeding supplement (ENSURE ENLIVE) (ENSURE ENLIVE) liquid 237 mL  237 mL Oral BID BM Money, Darnelle Maffucci B, FNP   237 mL at 05/06/18 0840  . hydrOXYzine (ATARAX/VISTARIL) tablet 25 mg  25 mg Oral Q6H PRN Money, Lowry Ram, FNP   25 mg at 05/04/18 1302  . hydrOXYzine (ATARAX/VISTARIL) tablet 25 mg  25 mg Oral TID PRN Money, Lowry Ram, FNP      . loperamide (IMODIUM) capsule 2-4 mg  2-4 mg Oral PRN Money, Lowry Ram, FNP      . magnesium hydroxide (MILK OF MAGNESIA) suspension 30 mL  30 mL Oral Daily PRN Money, Darnelle Maffucci B, FNP   30 mL at 05/04/18 2250  . multivitamin with minerals tablet 1 tablet  1 tablet Oral Daily Money, Lowry Ram, FNP   1 tablet at 05/06/18 [phone]  . ondansetron (ZOFRAN-ODT) disintegrating tablet 4 mg  4 mg Oral Q6H PRN Money, Lowry Ram, FNP       . thiamine (VITAMIN B-1) tablet 100 mg  100 mg Oral Daily Money, Lowry Ram, FNP   100 mg at 05/06/18 0839  . traZODone (DESYREL) tablet 50 mg  50 mg Oral QHS Derrill Center, NP   50 mg at 05/05/18 2200   PTA Medications: Medications Prior to Admission  Medication Sig Dispense Refill Last Dose  . chlordiazePOXIDE (LIBRIUM) 25 MG capsule 50mg  PO TID x 1D, then 25-50mg  PO BID X 1D, then 25-50mg  PO QD X 1D (Patient not taking: Reported on 05/03/2018) 10 capsule 0 Not Taking at Unknown time  . disulfiram (ANTABUSE) 250 MG tablet Take 1 tablet (250 mg total) by mouth 2 (two) times daily. (Patient not taking: Reported on 04/16/2018) 60 tablet 1 Not Taking at Unknown time  . gabapentin (NEURONTIN) 100 MG capsule Take 1 capsule (100 mg total) by mouth 3 (three) times daily. (Patient not taking: Reported on 05/03/2018) 90 capsule 1 Not Taking at Unknown time  . omeprazole (PRILOSEC) 20 MG capsule Take 1 capsule (20 mg total) by mouth daily. (Patient not taking: Reported on 05/03/2018) 90 capsule 1 Not Taking at Unknown time  . ondansetron (ZOFRAN ODT) 4 MG disintegrating tablet 4mg  ODT q4 hours prn nausea/vomit (Patient not taking: Reported on 05/03/2018) 10  tablet 0 Not Taking at Unknown time  . pantoprazole (PROTONIX) 20 MG tablet Take 1 tablet (20 mg total) by mouth 2 (two) times daily. (Patient not taking: Reported on 03/29/2018) 30 tablet 0 Not Taking at Unknown time  . venlafaxine XR (EFFEXOR XR) 75 MG 24 hr capsule Take 1 capsule (75 mg total) by mouth daily. (Patient not taking: Reported on 03/29/2018) 30 capsule 1 Not Taking at Unknown time    Patient Stressors: Financial difficulties Medication change or noncompliance Substance abuse  Patient Strengths: Average or above average intelligence Motivation for treatment/growth Supportive family/friends  Treatment Modalities: Medication Management, Group therapy, Case management,  1 to 1 session with clinician, Psychoeducation, Recreational  therapy.   Physician Treatment Plan for Primary Diagnosis: MDD (major depressive disorder), recurrent episode, severe ([place]) Long Term Goal(s): Improvement in symptoms so as ready for discharge Improvement in symptoms so as ready for discharge   Short Term Goals: Ability to identify and develop effective coping behaviors will improve Ability to maintain clinical measurements within normal limits will improve Compliance with prescribed medications will improve Ability to identify triggers associated with substance abuse/mental health issues will improve Ability to identify changes in lifestyle to reduce recurrence of condition will improve Ability to verbalize feelings will improve Ability to disclose and discuss suicidal ideas Ability to demonstrate self-control will improve  Medication Management: Evaluate patient's response, side effects, and tolerance of medication regimen.  Therapeutic Interventions: 1 to 1 sessions, Unit Group sessions and Medication administration.  Evaluation of Outcomes: Progressing  Physician Treatment Plan for Secondary Diagnosis: Principal Problem:   MDD (major depressive disorder), recurrent episode, severe ([place]) Active Problems:   Alcohol dependence ([place])   Alcohol abuse  Long Term Goal(s): Improvement in symptoms so as ready for discharge Improvement in symptoms so as ready for discharge   Short Term Goals: Ability to identify and develop effective coping behaviors will improve Ability to maintain clinical measurements within normal limits will improve Compliance with prescribed medications will improve Ability to identify triggers associated with substance abuse/mental health issues will improve Ability to identify changes in lifestyle to reduce recurrence of condition will improve Ability to verbalize feelings will improve Ability to disclose and discuss suicidal ideas Ability to demonstrate self-control will improve     Medication Management:  Evaluate patient's response, side effects, and tolerance of medication regimen.  Therapeutic Interventions: 1 to 1 sessions, Unit Group sessions and Medication administration.  Evaluation of Outcomes: Progressing   RN Treatment Plan for Primary Diagnosis: MDD (major depressive disorder), recurrent episode, severe ([place]) Long Term Goal(s): Knowledge of disease and therapeutic regimen to maintain health will improve  Short Term Goals: Ability to remain free from injury will improve, Ability to disclose and discuss suicidal ideas and Ability to identify and develop effective coping behaviors will improve  Medication Management: RN will administer medications as ordered by provider, will assess and evaluate patient's response and provide education to patient for prescribed medication. RN will report any adverse and/or side effects to prescribing provider.  Therapeutic Interventions: 1 on 1 counseling sessions, Psychoeducation, Medication administration, Evaluate responses to treatment, Monitor vital signs and CBGs as ordered, Perform/monitor CIWA, COWS, AIMS and Fall Risk screenings as ordered, Perform wound care treatments as ordered.  Evaluation of Outcomes: Progressing   LCSW Treatment Plan for Primary Diagnosis: MDD (major depressive disorder), recurrent episode, severe ([place]) Long Term Goal(s): Safe transition to appropriate next level of care at discharge, Engage patient in therapeutic group addressing interpersonal concerns.  Short Term  Goals: Engage patient in aftercare planning with referrals and resources, Facilitate patient progression through stages of change regarding substance use diagnoses and concerns and Identify triggers associated with mental health/substance abuse issues  Therapeutic Interventions: Assess for all discharge needs, 1 to 1 time with Social worker, Explore available resources and support systems, Assess for adequacy in community support network, Educate family and  significant other(s) on suicide prevention, Complete Psychosocial Assessment, Interpersonal group therapy.  Evaluation of Outcomes: Progressing   Progress in Treatment: Attending groups: Yes. Participating in groups: Yes. Taking medication as prescribed: Yes. Toleration medication: Yes. Family/Significant other contact made: No, will contact:  family member if pt consents to collateral contact.  Patient understands diagnosis: Yes. Discussing patient identified problems/goals with staff: Yes. Medical problems stabilized or resolved: Yes. Denies suicidal/homicidal ideation: Yes. Issues/concerns per patient self-inventory: No. Other: n/a   New problem(s) identified: No, Describe:  n/a  New Short Term/Long Term Goal(s): detox, medication management for mood stabilization; elimination of SI thoughts; development of comprehensive mental wellness/sobriety plan.   Patient Goals:  "Learn skills to avoid relapsing on alcohol and learn how to cope with life better."   Discharge Plan or Barriers: CSW assessing for appropriate referrals. Pt is interested in residential treatment ([facility] referrals made). Pt goes to [facility] for medication management. [place] pamphlet, Mobile Crisis information, and AA/NA information provided to patient for additional community support and resources.   Reason for Continuation of Hospitalization: Anxiety Depression Medication stabilization Suicidal ideation Withdrawal symptoms  Estimated Length of Stay: Thursday, 6/13  Attendees: Patient: [name] [name] 05/06/2018 10:15 AM  Physician: Dr. Mallie Darting MD; Dr. Nancy Fetter MD 05/06/2018 10:15 AM  Nursing: Opal Sidles RN; Benjamine Mola RN 05/06/2018 10:15 AM  RN Care Manager:x 05/06/2018 10:15 AM  Social Worker: Janice Norrie LCSW 05/06/2018 10:15 AM  Recreational Therapist: x 05/06/2018 10:15 AM  Other: Ricky Ala NP; Waylan Boga NP 05/06/2018 10:15 AM  Other:  05/06/2018 10:15 AM  Other: 05/06/2018 10:15 AM    Scribe  for Treatment Team: Avelina Laine, LCSW 05/06/2018 10:15 AM

## 2018-05-06 NOTE — Plan of Care (Signed)
Pt continues to progress towards goals and d/c. RN will continue to monitor.  

## 2018-05-07 NOTE — Progress Notes (Signed)
Patient resting in bed with eyes closed upon approach. Patient states he is anxious about going to [facility], but still slept ok last night. Patient denies SI/HI/AVH. Denies physical pain. Patient compliant with medication administration. Safety maintained with 15 minute checks. Will continue to monitor.

## 2018-05-07 NOTE — Progress Notes (Signed)
Nursing note 7p-7a  Pt observed interacting with peers on unit this shift. Displayed a anxious affect and mood upon interaction with this Probation officer. Pt denies pain ,denies SI/HI, and also denies audio or visual hallucinations at this time. C/o anxiety/ prn effective see MAR. Pt able to contract for safety. Goal: To stay focused on what I need to do to stay well. Pt  now resting in bed with eyes closed with no signs or symptoms of pain or distress noted. Pt continues to remain safe on the unit and is observed by rounding every 15 min. RN will continue to monitor.

## 2018-05-07 NOTE — Plan of Care (Signed)
Problem: Education: Goal: Knowledge of [facility] Education information/materials will improve Outcome: Adequate for Discharge Goal: Emotional status will improve Outcome: Adequate for Discharge Goal: Mental status will improve Outcome: Adequate for Discharge Goal: Verbalization of understanding the information provided will improve Outcome: Adequate for Discharge   Problem: Activity: Goal: Interest or engagement in activities will improve Outcome: Adequate for Discharge Goal: Sleeping patterns will improve Outcome: Adequate for Discharge   Problem: Coping: Goal: Ability to verbalize frustrations and anger appropriately will improve Outcome: Adequate for Discharge Goal: Ability to demonstrate self-control will improve Outcome: Adequate for Discharge   Problem: Health Behavior/Discharge Planning: Goal: Identification of resources available to assist in meeting health care needs will improve Outcome: Adequate for Discharge Goal: Compliance with treatment plan for underlying cause of condition will improve Outcome: Adequate for Discharge   Problem: Physical Regulation: Goal: Ability to maintain clinical measurements within normal limits will improve Outcome: Adequate for Discharge   Problem: Safety: Goal: Periods of time without injury will increase Outcome: Adequate for Discharge   Problem: Education: Goal: Knowledge of disease or condition will improve Outcome: Adequate for Discharge Goal: Understanding of discharge needs will improve Outcome: Adequate for Discharge   Problem: Health Behavior/Discharge Planning: Goal: Ability to identify changes in lifestyle to reduce recurrence of condition will improve Outcome: Adequate for Discharge Goal: Identification of resources available to assist in meeting health care needs will improve Outcome: Adequate for Discharge   Problem: Physical Regulation: Goal: Complications related to the disease process, condition or  treatment will be avoided or minimized Outcome: Adequate for Discharge   Problem: Safety: Goal: Ability to remain free from injury will improve Outcome: Adequate for Discharge   Problem: Education: Goal: Ability to state activities that reduce stress will improve Outcome: Adequate for Discharge   Problem: Coping: Goal: Ability to identify and develop effective coping behavior will improve Outcome: Adequate for Discharge   Problem: Self-Concept: Goal: Ability to identify factors that promote anxiety will improve Outcome: Adequate for Discharge Goal: Level of anxiety will decrease Outcome: Adequate for Discharge Goal: Ability to modify response to factors that promote anxiety will improve Outcome: Adequate for Discharge   Problem: Education: Goal: Ability to make informed decisions regarding treatment will improve Outcome: Adequate for Discharge   Problem: Coping: Goal: Coping ability will improve Outcome: Adequate for Discharge   Problem: Health Behavior/Discharge Planning: Goal: Identification of resources available to assist in meeting health care needs will improve Outcome: Adequate for Discharge   Problem: Medication: Goal: Compliance with prescribed medication regimen will improve Outcome: Adequate for Discharge   Problem: Self-Concept: Goal: Ability to disclose and discuss suicidal ideas will improve Outcome: Adequate for Discharge Goal: Will verbalize positive feelings about self Outcome: Adequate for Discharge   Problem: Education: Goal: Utilization of techniques to improve thought processes will improve Outcome: Adequate for Discharge Goal: Knowledge of the prescribed therapeutic regimen will improve Outcome: Adequate for Discharge   Problem: Activity: Goal: Interest or engagement in leisure activities will improve Outcome: Adequate for Discharge Goal: Imbalance in normal sleep/wake cycle will improve Outcome: Adequate for Discharge   Problem:  Coping: Goal: Coping ability will improve Outcome: Adequate for Discharge Goal: Will verbalize feelings Outcome: Adequate for Discharge   Problem: Health Behavior/Discharge Planning: Goal: Ability to make decisions will improve Outcome: Adequate for Discharge Goal: Compliance with therapeutic regimen will improve Outcome: Adequate for Discharge   Problem: Role Relationship: Goal: Will demonstrate positive changes in social behaviors and relationships Outcome: Adequate for Discharge  Problem: Safety: Goal: Ability to disclose and discuss suicidal ideas will improve Outcome: Adequate for Discharge Goal: Ability to identify and utilize support systems that promote safety will improve Outcome: Adequate for Discharge   Problem: Self-Concept: Goal: Will verbalize positive feelings about self Outcome: Adequate for Discharge Goal: Level of anxiety will decrease Outcome: Adequate for Discharge

## 2018-05-07 NOTE — Progress Notes (Signed)
Discharge note: Patient discharged to [facility] (arrived at 8:30) with voucher- going to [facility] in [facility]. All of patient's belongings returned to patient and all discharge paperwork reviewed with patient. All medications reviewed and samples were given. Patient was given the opportunity to ask questions. All questions and concerns addressed. Patient thanked Probation officer for everything. Denied SI/HI/AVS.

## 2018-05-08 NOTE — Discharge Summary (Signed)
Physician Discharge Summary Note  Patient:  [name] [name] is an 57 y.o., male MRN:  [id number] DOB:  [birth year] Patient phone:  [phone] (home)  Patient address:   [address] [place] [address],  Total Time spent with patient: 45 minutes  Date of Admission:  05/04/2018 Date of Discharge: 05/06/2018  Reason for Admission:  Suicide threat  Principal Problem: MDD (major depressive disorder), recurrent episode, severe ([place]) Discharge Diagnoses: Patient Active Problem List   Diagnosis Date Noted  . Alcohol abuse [F10.10] 01/19/2014    Priority: High  . Alcohol dependence ([place]) [F10.20] 09/02/2013    Priority: High  . Generalized anxiety disorder [F41.1] 09/02/2013    Priority: High  . MDD (major depressive disorder), recurrent episode, severe ([place]) [F33.2] 06/30/2017  . Lateral epicondylitis of right elbow [M77.11] 01/05/2017  . Strain, MCP, hand, right, initial encounter [K53.976B] 01/05/2017  . 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 Bilateral knee pain [M25.561, M25.562] 01/11/2016  . Chronic pain of right wrist [M25.531, G89.29] 07/07/2015  . 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 Dislocation of right shoulder joint [S43.004A] 05/20/2015  . Tear of medial meniscus of right knee [S83.241A] 03/24/2015  . 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 Chronic cough [R05] 01/15/2015  . Onychomycosis of toenail [B35.1] 12/15/2014  . Seborrhea [L21.9] 11/11/2014  . GERD (gastroesophageal reflux disease) [K21.9]     Past Psychiatric History: depression, substance abuse  Past Medical History:  Past Medical History:  Diagnosis Date  . Alcohol abuse    Last Drink on Sep 2018  . Anxiety    About 57 years of age  . Carpal tunnel syndrome of right wrist Dx 2015  . Depression    At 57 years of age  . GERD (gastroesophageal reflux disease) Dx 2007  . Hepatitis, alcoholic, acute 3419  . Hyperlipidemia    borderline, diet controlled  . Pancreatitis  Dx 2014  . Pneumonia 11/2014  . 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 Reflux Dx 2007  . 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 carpel tunnel release Right 07/28/2014    done in [place]   . ELBOW SURGERY    . KNEE ARTHROSCOPY    . KNEE SURGERY Left    15 years ago  . LIPOMA EXCISION N/A 05/10/2016   Procedure: EXCISION OF SCALP LIPOMA;  Surgeon: Johnathan Hausen, MD;  Location: [facility];  Service: General;  Laterality: N/A;   Family History:  Family History  Problem Relation Age of Onset  . Diabetes Father   . Hypertension Father   . Hypertension Paternal Grandfather   . Alcohol abuse Paternal Grandfather   . Alcohol abuse Maternal Grandfather   . Alcohol abuse Maternal Grandmother   . Alcohol abuse Paternal Grandmother   . Colon cancer Neg Hx   . Rectal cancer Neg Hx   . Stomach cancer Neg Hx   . Bipolar disorder Neg Hx   . Depression Neg Hx    Family Psychiatric  History: see above Social History:  Social History   Substance and Sexual Activity  Alcohol Use Yes  . Alcohol/week: 0.0 oz  . Frequency: Never   Comment: last use today 05/03/18      Social History   Substance and Sexual Activity  Drug Use Not Currently  . Types: Marijuana   Comment: Patient denies     Social History   Socioeconomic History  .  Marital status: Legally Separated    Spouse name: Not on file  . Number of children: 2  . Years of education: 13 yrs  . Highest education level: Not on file  Occupational History  . Occupation: custodian    Comment: [employer]  Social Needs  . Financial resource strain: Not on file  . Food insecurity:    Worry: Not on file    Inability: Not on file  . Transportation needs:    Medical: Not on file    Non-medical: Not on file  Tobacco Use  . Smoking status: Never Smoker  . Smokeless tobacco: Never Used  Substance and Sexual Activity  . Alcohol use: Yes     Alcohol/week: 0.0 oz    Frequency: Never    Comment: last use today 05/03/18   . Drug use: Not Currently    Types: Marijuana    Comment: Patient denies   . Sexual activity: Not on file  Lifestyle  . Physical activity:    Days per week: Not on file    Minutes per session: Not on file  . Stress: Not on file  Relationships  . Social connections:    Talks on phone: Not on file    Gets together: Not on file    Attends religious service: Not on file    Active member of club or organization: Not on file    Attends meetings of clubs or organizations: Not on file    Relationship status: Not on file  Other Topics Concern  . Not on file  Social History Narrative   Born and raised in [place], [place] by both parents. He has 2 sisters and is the middle child. He remains close with family. He was married for 16 yrs and seperated in 2005. He has 2 boys who are in their 20's now. He lives alone in [place]. He is working as a [employer] Course:   05/04/18 [facility] Counselor Assessment:57 y.o.malewho presents to the ED voluntarily.Pt reports he has been increasingly depressed, hopeless, and contemplating suicide. Pt stated "if I had a gun, I would not be here." Pt states he is homeless and sleeps in the woods. Pt states he has been drinking alcohol in excess for years and he has no support. Pt states he would rather die than to continue living this way. Pt has attended [facility] in the past for SA treatment and states he was able to stay sober for 1 year after SA treatment. Pt states he feels like God is punishing him and he continues to ask ""why am I alive, what is the point, why am I going through all this hell?" Pt's current BAL is 367. Pt has a hx of ED visits c/o alcohol dependence. Pt's most recent TTS assessment occurred on 03/29/18 c/o similar concerns related to alcohol abuse and hopelessness. Pt states he does not have a current provider and has no family support. Pt is unable to contract for  safety at this time and states if he is d/c he will try to harm himself.  On evaluation today: Patient confirms the above information. Patient was admitted a year ago in August for same complaint of alcohol abuse and suicidal ideations. Patient stated today that he was kicked out of his parents house about a month ago due to his excessive alcoholism. Patient states that he wants to get some help for his drinking and his depression. He is hoping to have a place to stay but he  is already informed that we do not provide housing to the hospital, but we can give him resources to help him after discharge. Patient has been prescribed Effexor in the past, however, patient states that he continued to drink while on the Effexor so he is not sure if it helped him or not. Based off a chart review I do not see where patient had remained sober for 1 year as he reported. Patient meets inpatient criteria. Patient reports current passive SI, but denies any HI/AVH and contracts for safety. Patient rerpeatedly denies any drug abuse, but can't explain the UDS being positive for benzodiazepines.   Medications:  [facility] alcohol detox protocol in place, started gabapentin 100 mg TID for withdrawal symptoms and antabuse 250 mg BID for alcohol dependence. Effexor 75 mg daily for depression  05/05/2018:  [name] [name] seen standing in bedroom.  He is awake alert and oriented x3. [name] [name] presents pleasant, clam and cooperative.  Patient is requesting to be restarted on Lexapro for his depression and is requesting something to help him sleep during the night.  Reports "my biggest stressor is my drinking addiction."  States he has attended [facility] meetings in the past.  Denies any suicidal homicidal during this assessment.  Patient was initiated on the detox protocol.  Denies headache, nausea vomiting or tremors.  NP discussed initiating Lexapro 10 mg and trazodone 50 milligrams p.o. Nightly. [name] [name] was agreeable to treatment plan.  Patient  encouraged to attend group sessions.  Reports a good appetite.  Support encouragement reassurance was provided.  History: per assessment note-57 y.o.malewho presents to the ED voluntarily.Pt reports he has been increasingly depressed, hopeless, and contemplating suicide. Pt stated "if I had a gun, I would not be here." Pt states he is homeless and sleeps in the woods. Pt states he has been drinking alcohol in excess for years and he has no support. Pt states he would rather die than to continue living this way. Pt has attended [facility] in the past for SA treatment and states he was able to stay sober for 1 year after SA treatment. Pt states he feels like God is punishing him and he continues to ask ""why am I alive, what is the point, why am I going through all this hell?" Pt's current BAL is 367. Pt has a hx of ED visits c/o alcohol dependence. Pt's most recent TTS assessment occurred on 03/29/18 c/o similar concerns related to alcohol abuse and hopelessness. Pt states he does not have a current provider and has no family support. Pt is unable to contract for safety at this time and states if he is d/c he will try to harm himself.  Medications:  Started Lexapro 10 mg daily for depression and Trazodone 100 mg at bedtime for sleep  05/06/2018: Patient has met maximum benefit from hospitalization. Denies suicidal/homicidal ideations, hallucinations, or withdrawal symptoms. Discharge instructions were provided with explanations along with 24 hour crisis numbers, follow-up appointment, and Rx. Stable for discharge.   Physical Findings: AIMS: Facial and Oral Movements Muscles of Facial Expression: None, normal Lips and Perioral Area: None, normal Jaw: None, normal Tongue: None, normal,Extremity Movements Upper (arms, wrists, hands, fingers): None, normal Lower (legs, knees, ankles, toes): None, normal, Trunk Movements Neck, shoulders, hips: None, normal, Overall Severity Severity of abnormal  movements (highest score from questions above): None, normal Incapacitation due to abnormal movements: None, normal Patient's awareness of abnormal movements (rate only patient's report): No Awareness, Dental Status Current problems with teeth and/or dentures?: No Does  patient usually wear dentures?: No  CIWA:  CIWA-Ar Total: 4 COWS:     Musculoskeletal: Strength & Muscle Tone: within normal limits Gait & Station: normal Patient leans: N/A  Psychiatric Specialty Exam: Physical Exam  Nursing note and vitals reviewed. Constitutional: He is oriented to person, place, and time. He appears well-developed and well-nourished.  HENT:  Head: Normocephalic and atraumatic.  Respiratory: Effort normal.  Neurological: He is alert and oriented to person, place, and time.    ROS  Blood pressure (!) 141/83, pulse 60, temperature 97.9 F (36.6 C), temperature source Oral, resp. rate 20, height '5\' 9"'  (1.753 m), weight 65.8 kg (145 lb), SpO2 98 %.Body mass index is 21.41 kg/m.  General Appearance: Casual  Eye Contact:  Fair  Speech:  Normal Rate  Volume:  Normal  Mood:  Anxious  Affect:  Congruent  Thought Process:  Coherent  Orientation:  Full (Time, Place, and Person)  Thought Content:  Logical  Suicidal Thoughts:  No  Homicidal Thoughts:  No  Memory:  Immediate;   Fair Recent;   Fair Remote;   Fair  Judgement:  Intact  Insight:  Fair  Psychomotor Activity:  Increased  Concentration:  Concentration: Fair and Attention Span: Fair  Recall:  [place] of Knowledge:  Good  Language:  Fair  Akathisia:  Negative  Handed:  Right  AIMS (if indicated):     Assets:  Desire for Improvement Physical Health Resilience  ADL's:  Intact  Cognition:  WNL  Sleep:  Number of Hours: 4    Have you used any form of tobacco in the last 30 days? (Cigarettes, Smokeless Tobacco, Cigars, and/or Pipes): No  Has this patient used any form of tobacco in the last 30 days? (Cigarettes, Smokeless  Tobacco, Cigars, and/or Pipes)  Yes, A prescription for an FDA-approved tobacco cessation medication was offered at discharge and the patient refused  Blood Alcohol level:  Lab Results  Component Value Date   ETH 367 ([place]) 05/03/2018   ETH <10 25/42/7062    Metabolic Disorder Labs:  Lab Results  Component Value Date   HGBA1C 6.0 10/22/2017   MPG 120 06/30/2017   No results found for: PROLACTIN Lab Results  Component Value Date   CHOL 330 (H) 06/30/2017   TRIG 61 06/30/2017   HDL 196 06/30/2017   CHOLHDL 1.7 06/30/2017   VLDL 12 06/30/2017   LDLCALC 122 (H) 06/30/2017   LDLCALC 128 (H) 12/15/2014    See Psychiatric Specialty Exam and Suicide Risk Assessment completed by Attending Physician prior to discharge.  Discharge destination:  Home  Is patient on multiple antipsychotic therapies at discharge:  No   Has Patient had three or more failed trials of antipsychotic monotherapy by history:  No  Recommended Plan for Multiple Antipsychotic Therapies: NA   Allergies as of 05/07/2018      Reactions   Oxycodone Other (See Comments)   abd pain, stomach cramps       Medication List    STOP taking these medications   chlordiazePOXIDE 25 MG capsule Commonly known as:  LIBRIUM   disulfiram 250 MG tablet Commonly known as:  ANTABUSE   gabapentin 100 MG capsule Commonly known as:  NEURONTIN   omeprazole 20 MG capsule Commonly known as:  PRILOSEC   ondansetron 4 MG disintegrating tablet Commonly known as:  ZOFRAN ODT   pantoprazole 20 MG tablet Commonly known as:  PROTONIX   venlafaxine XR 75 MG 24 hr capsule Commonly known as:  EFFEXOR XR     TAKE these medications     Indication  escitalopram 10 MG tablet Commonly known as:  LEXAPRO Take 1 tablet (10 mg total) by mouth daily.  Indication:  Major Depressive Disorder   hydrOXYzine 25 MG tablet Commonly known as:  ATARAX/VISTARIL Take 1 tablet (25 mg total) by mouth every 6 (six) hours as needed for anxiety  (or CIWA score </= 10).  Indication:  Feeling Anxious   traZODone 100 MG tablet Commonly known as:  DESYREL Take 1 tablet (100 mg total) by mouth at bedtime.  Indication:  Trouble Sleeping      Follow-up Information    Services, [facility] Follow up on 05/14/2018.   Why:  Screening for possible admission on Tuesday, 05/07/18 at 8:00AM. Please bring: photo ID/proof of [facility] residency, 30 day supply of all medications you are prescribed, and clothing. Thank you.  Contact information: [address] [place] [address] [phone]        [facility] Follow up.   Specialty:  Addiction Medicine Why:  Referral made: 05/06/18. No beds available at this time.  Contact information: [place] [address] [phone]        [facility] Follow up.   Why:  Please contact provider prior to leaving [facility] in order to schedule follow-up appt. Thank you.  Contact information: [place]          Follow-up recommendations:  Activity:  as tolerated Diet:  heart healthy diet  Comments:  Follow-up with [facility] this week  Signed: Waylan Boga, NP 05/08/2018, 8:01 AM

## 2020-12-20 DIAGNOSIS — H5203 Hypermetropia, bilateral: Secondary | ICD-10-CM | POA: Insufficient documentation

## 2020-12-20 DIAGNOSIS — H43811 Vitreous degeneration, right eye: Secondary | ICD-10-CM | POA: Insufficient documentation

## 2020-12-21 DIAGNOSIS — L219 Seborrheic dermatitis, unspecified: Secondary | ICD-10-CM | POA: Insufficient documentation

## 2020-12-21 DIAGNOSIS — F1910 Other psychoactive substance abuse, uncomplicated: Secondary | ICD-10-CM | POA: Insufficient documentation

## 2020-12-21 DIAGNOSIS — Z8639 Personal history of other endocrine, nutritional and metabolic disease: Secondary | ICD-10-CM | POA: Insufficient documentation

## 2021-03-16 ENCOUNTER — Ambulatory Visit (HOSPITAL_COMMUNITY)
Admission: EM | Admit: 2021-03-16 | Discharge: 2021-03-16 | Disposition: A | Discharge status: Other Acute Inpatient Hospital | Payer: No Payment, Other | Attending: Student in an Organized Health Care Education/Training Program | Admitting: Student in an Organized Health Care Education/Training Program

## 2021-03-16 ENCOUNTER — Encounter (HOSPITAL_COMMUNITY): Payer: Self-pay | Admitting: Student in an Organized Health Care Education/Training Program

## 2021-03-16 ENCOUNTER — Other Ambulatory Visit: Payer: Self-pay

## 2021-03-16 ENCOUNTER — Inpatient Hospital Stay (HOSPITAL_COMMUNITY)
Admission: AD | Admit: 2021-03-16 | Discharge: 2021-03-22 | DRG: 885 | Disposition: A | Discharge status: Home / Self Care | Payer: Federal, State, Local not specified - Other | Source: Intra-hospital | Attending: Psychiatry | Admitting: Psychiatry

## 2021-03-16 DIAGNOSIS — F1020 Alcohol dependence, uncomplicated: Secondary | ICD-10-CM | POA: Diagnosis present

## 2021-03-16 DIAGNOSIS — F1094 Alcohol use, unspecified with alcohol-induced mood disorder: Secondary | ICD-10-CM | POA: Diagnosis not present

## 2021-03-16 DIAGNOSIS — F332 Major depressive disorder, recurrent severe without psychotic features: Secondary | ICD-10-CM | POA: Insufficient documentation

## 2021-03-16 DIAGNOSIS — F322 Major depressive disorder, single episode, severe without psychotic features: Secondary | ICD-10-CM | POA: Insufficient documentation

## 2021-03-16 DIAGNOSIS — F411 Generalized anxiety disorder: Secondary | ICD-10-CM | POA: Diagnosis present

## 2021-03-16 DIAGNOSIS — Z5900 Homelessness unspecified: Secondary | ICD-10-CM | POA: Insufficient documentation

## 2021-03-16 DIAGNOSIS — Z560 Unemployment, unspecified: Secondary | ICD-10-CM | POA: Insufficient documentation

## 2021-03-16 DIAGNOSIS — F10239 Alcohol dependence with withdrawal, unspecified: Secondary | ICD-10-CM | POA: Diagnosis present

## 2021-03-16 DIAGNOSIS — G4700 Insomnia, unspecified: Secondary | ICD-10-CM | POA: Diagnosis present

## 2021-03-16 DIAGNOSIS — R45851 Suicidal ideations: Secondary | ICD-10-CM | POA: Insufficient documentation

## 2021-03-16 DIAGNOSIS — Z20822 Contact with and (suspected) exposure to covid-19: Secondary | ICD-10-CM | POA: Insufficient documentation

## 2021-03-16 DIAGNOSIS — I10 Essential (primary) hypertension: Secondary | ICD-10-CM | POA: Diagnosis present

## 2021-03-16 DIAGNOSIS — F10230 Alcohol dependence with withdrawal, uncomplicated: Secondary | ICD-10-CM | POA: Insufficient documentation

## 2021-03-16 DIAGNOSIS — E785 Hyperlipidemia, unspecified: Secondary | ICD-10-CM | POA: Diagnosis present

## 2021-03-16 LAB — POCT URINE DRUG SCREEN - MANUAL ENTRY (I-SCREEN)
POC Amphetamine UR: NOT DETECTED
POC Buprenorphine (BUP): NOT DETECTED
POC Cocaine UR: NOT DETECTED
POC Marijuana UR: NOT DETECTED
POC Methadone UR: NOT DETECTED
POC Methamphetamine UR: NOT DETECTED
POC Morphine: NOT DETECTED
POC Oxazepam (BZO): POSITIVE — AB
POC Oxycodone UR: NOT DETECTED
POC Secobarbital (BAR): NOT DETECTED

## 2021-03-16 LAB — COMPREHENSIVE METABOLIC PANEL
ALT: 16 U/L (ref 0–44)
AST: 22 U/L (ref 15–41)
Albumin: 4.4 g/dL (ref 3.5–5.0)
Alkaline Phosphatase: 36 U/L — ABNORMAL LOW (ref 38–126)
Anion gap: 8 (ref 5–15)
BUN: 9 mg/dL (ref 6–20)
CO2: 29 mmol/L (ref 22–32)
Calcium: 10.1 mg/dL (ref 8.9–10.3)
Chloride: 99 mmol/L (ref 98–111)
Creatinine, Ser: 1.16 mg/dL (ref 0.61–1.24)
GFR, Estimated: >60 mL/min (ref >60)
Glucose, Bld: 98 mg/dL (ref 70–99)
Potassium: 4.2 mmol/L (ref 3.5–5.1)
Sodium: 136 mmol/L (ref 135–145)
Total Bilirubin: 0.6 mg/dL (ref 0.3–1.2)
Total Protein: 7.9 g/dL (ref 6.5–8.1)

## 2021-03-16 LAB — LIPID PANEL
Cholesterol: 357 mg/dL — ABNORMAL HIGH (ref 0–200)
HDL: >135 mg/dL (ref >40)
Triglycerides: 57 mg/dL (ref <150)
VLDL: 11 mg/dL (ref 0–40)

## 2021-03-16 LAB — CBC WITH DIFFERENTIAL/PLATELET
Abs Immature Granulocytes: 0.03 10*3/uL (ref 0.00–0.07)
Basophils Absolute: 0 10*3/uL (ref 0.0–0.1)
Basophils Relative: 1 %
Eosinophils Absolute: 0 10*3/uL (ref 0.0–0.5)
Eosinophils Relative: 1 %
HCT: 42.5 % (ref 39.0–52.0)
Hemoglobin: 13.5 g/dL (ref 13.0–17.0)
Immature Granulocytes: 1 %
Lymphocytes Relative: 12 %
Lymphs Abs: 0.8 10*3/uL (ref 0.7–4.0)
MCH: 26.7 pg (ref 26.0–34.0)
MCHC: 31.8 g/dL (ref 30.0–36.0)
MCV: 84.2 fL (ref 80.0–100.0)
Monocytes Absolute: 0.7 10*3/uL (ref 0.1–1.0)
Monocytes Relative: 11 %
Neutro Abs: 5 10*3/uL (ref 1.7–7.7)
Neutrophils Relative %: 74 %
Platelets: 262 10*3/uL (ref 150–400)
RBC: 5.05 MIL/uL (ref 4.22–5.81)
RDW: 17.7 % — ABNORMAL HIGH (ref 11.5–15.5)
WBC: 6.6 10*3/uL (ref 4.0–10.5)
nRBC: 0 % (ref 0.0–0.2)

## 2021-03-16 LAB — RESP PANEL BY RT-PCR (FLU A&B, COVID) ARPGX2
Influenza A by PCR: NEGATIVE
Influenza B by PCR: NEGATIVE
SARS Coronavirus 2 by RT PCR: NEGATIVE

## 2021-03-16 LAB — URINALYSIS, ROUTINE W REFLEX MICROSCOPIC
Bilirubin Urine: NEGATIVE
Glucose, UA: NEGATIVE mg/dL
Hgb urine dipstick: NEGATIVE
Ketones, ur: NEGATIVE mg/dL
Leukocytes,Ua: NEGATIVE
Nitrite: NEGATIVE
Protein, ur: NEGATIVE mg/dL
Specific Gravity, Urine: 1.005 (ref 1.005–1.030)
pH: 8 (ref 5.0–8.0)

## 2021-03-16 LAB — POC SARS CORONAVIRUS 2 AG: SARS Coronavirus 2 Ag: NEGATIVE

## 2021-03-16 LAB — ETHANOL: Alcohol, Ethyl (B): <10 mg/dL (ref <10)

## 2021-03-16 LAB — TSH: TSH: 1.328 u[IU]/mL (ref 0.350–4.500)

## 2021-03-16 LAB — HEMOGLOBIN A1C
Hgb A1c MFr Bld: 5.9 % — ABNORMAL HIGH (ref 4.8–5.6)
Mean Plasma Glucose: 122.63 mg/dL

## 2021-03-16 LAB — GLUCOSE, CAPILLARY: Glucose-Capillary: 89 mg/dL (ref 70–99)

## 2021-03-16 LAB — CBG MONITORING, ED: Glucose-Capillary: 87

## 2021-03-16 MED ORDER — THIAMINE HCL 100 MG/ML IJ SOLN: 100.0000 mg | Freq: Once | INTRAMUSCULAR | Status: DC

## 2021-03-16 MED ORDER — THIAMINE HCL 100 MG PO TABS: 100.0000 mg | ORAL_TABLET | Freq: Every day | ORAL | Status: DC

## 2021-03-16 MED ORDER — LORAZEPAM 1 MG PO TABS: 1.0000 mg | ORAL_TABLET | Freq: Two times a day (BID) | ORAL | Status: DC

## 2021-03-16 MED ORDER — ALUM & MAG HYDROXIDE-SIMETH 200-200-20 MG/5ML PO SUSP: 30.0000 mL | ORAL | Status: DC | PRN

## 2021-03-16 MED ORDER — HYDROXYZINE HCL 25 MG PO TABS
25.0000 mg | ORAL_TABLET | Freq: Four times a day (QID) | ORAL | Status: AC | PRN
  Administered 2021-03-17 – 2021-03-18 (×3): 25 mg via ORAL
  Filled 2021-03-16 (×3): qty 1

## 2021-03-16 MED ORDER — LORAZEPAM 1 MG PO TABS: 1.0000 mg | ORAL_TABLET | Freq: Four times a day (QID) | ORAL | Status: DC | PRN

## 2021-03-16 MED ORDER — LORAZEPAM 1 MG PO TABS
1.0000 mg | ORAL_TABLET | Freq: Four times a day (QID) | ORAL | Status: AC
  Administered 2021-03-16 – 2021-03-17 (×6): 1 mg via ORAL
  Filled 2021-03-16 (×6): qty 1

## 2021-03-16 MED ORDER — LORAZEPAM 1 MG PO TABS
1.0000 mg | ORAL_TABLET | Freq: Three times a day (TID) | ORAL | Status: AC
  Administered 2021-03-18 (×3): 1 mg via ORAL
  Filled 2021-03-16 (×2): qty 1

## 2021-03-16 MED ORDER — LORAZEPAM 1 MG PO TABS: 1.0000 mg | ORAL_TABLET | Freq: Every day | ORAL | Status: DC

## 2021-03-16 MED ORDER — MAGNESIUM HYDROXIDE 400 MG/5ML PO SUSP: 30.0000 mL | Freq: Every day | ORAL | Status: DC | PRN

## 2021-03-16 MED ORDER — LORAZEPAM 1 MG PO TABS
1.0000 mg | ORAL_TABLET | Freq: Four times a day (QID) | ORAL | Status: DC
  Administered 2021-03-16 (×2): 1 mg via ORAL
  Filled 2021-03-16 (×2): qty 1

## 2021-03-16 MED ORDER — LORAZEPAM 1 MG PO TABS
1.0000 mg | ORAL_TABLET | Freq: Every day | ORAL | Status: AC
  Administered 2021-03-20: 1 mg via ORAL
  Filled 2021-03-16: qty 1

## 2021-03-16 MED ORDER — AMLODIPINE BESYLATE 5 MG PO TABS
5.0000 mg | ORAL_TABLET | Freq: Every day | ORAL | Status: DC
  Administered 2021-03-16: 5 mg via ORAL
  Filled 2021-03-16: qty 1

## 2021-03-16 MED ORDER — LORAZEPAM 1 MG PO TABS
1.0000 mg | ORAL_TABLET | Freq: Two times a day (BID) | ORAL | Status: AC
  Administered 2021-03-19 (×2): 1 mg via ORAL
  Filled 2021-03-16 (×3): qty 1

## 2021-03-16 MED ORDER — LORAZEPAM 1 MG PO TABS: 1.0000 mg | ORAL_TABLET | Freq: Four times a day (QID) | ORAL | Status: AC | PRN

## 2021-03-16 MED ORDER — ATORVASTATIN CALCIUM 10 MG PO TABS
20.0000 mg | ORAL_TABLET | Freq: Every day | ORAL | Status: DC
  Administered 2021-03-16: 20 mg via ORAL
  Filled 2021-03-16: qty 2

## 2021-03-16 MED ORDER — THIAMINE HCL 100 MG PO TABS
100.0000 mg | ORAL_TABLET | Freq: Every day | ORAL | Status: DC
  Administered 2021-03-17 – 2021-03-22 (×6): 100 mg via ORAL
  Filled 2021-03-16 (×8): qty 1

## 2021-03-16 MED ORDER — AMLODIPINE BESYLATE 5 MG PO TABS
5.0000 mg | ORAL_TABLET | Freq: Every day | ORAL | Status: DC
  Administered 2021-03-17 – 2021-03-22 (×6): 5 mg via ORAL
  Filled 2021-03-16: qty 1
  Filled 2021-03-16: qty 18
  Filled 2021-03-16 (×6): qty 1

## 2021-03-16 MED ORDER — LORAZEPAM 1 MG PO TABS: 1.0000 mg | ORAL_TABLET | Freq: Three times a day (TID) | ORAL | Status: DC

## 2021-03-16 MED ORDER — ONDANSETRON 4 MG PO TBDP: 4.0000 mg | ORAL_TABLET | Freq: Four times a day (QID) | ORAL | Status: DC | PRN

## 2021-03-16 MED ORDER — ACETAMINOPHEN 325 MG PO TABS
650.0000 mg | ORAL_TABLET | Freq: Four times a day (QID) | ORAL | Status: DC | PRN
Start: 2021-03-16 — End: 2021-03-22
  Administered 2021-03-17: 650 mg via ORAL
  Filled 2021-03-16: qty 2

## 2021-03-16 MED ORDER — ATORVASTATIN CALCIUM 20 MG PO TABS
20.0000 mg | ORAL_TABLET | Freq: Every day | ORAL | Status: DC
  Administered 2021-03-17 – 2021-03-22 (×6): 20 mg via ORAL
  Filled 2021-03-16 (×6): qty 1
  Filled 2021-03-16: qty 18
  Filled 2021-03-16: qty 1

## 2021-03-16 MED ORDER — ADULT MULTIVITAMIN W/MINERALS CH
1.0000 | ORAL_TABLET | Freq: Every day | ORAL | Status: DC
  Administered 2021-03-16: 1 via ORAL
  Filled 2021-03-16: qty 1

## 2021-03-16 MED ORDER — LOPERAMIDE HCL 2 MG PO CAPS: 2.0000 mg | ORAL_CAPSULE | ORAL | Status: AC | PRN

## 2021-03-16 MED ORDER — TRAZODONE HCL 50 MG PO TABS
50.0000 mg | ORAL_TABLET | Freq: Every evening | ORAL | Status: DC | PRN
  Administered 2021-03-16 – 2021-03-21 (×7): 50 mg via ORAL
  Filled 2021-03-16 (×5): qty 1
  Filled 2021-03-16: qty 18
  Filled 2021-03-16: qty 1

## 2021-03-16 MED ORDER — TRAZODONE HCL 50 MG PO TABS: 50.0000 mg | ORAL_TABLET | Freq: Every evening | ORAL | Status: DC | PRN

## 2021-03-16 MED ORDER — HYDROXYZINE HCL 25 MG PO TABS: 25.0000 mg | ORAL_TABLET | Freq: Three times a day (TID) | ORAL | Status: DC | PRN

## 2021-03-16 MED ORDER — ONDANSETRON 4 MG PO TBDP: 4.0000 mg | ORAL_TABLET | Freq: Four times a day (QID) | ORAL | Status: AC | PRN

## 2021-03-16 MED ORDER — ACETAMINOPHEN 325 MG PO TABS: 650.0000 mg | ORAL_TABLET | Freq: Four times a day (QID) | ORAL | Status: DC | PRN

## 2021-03-16 MED ORDER — ADULT MULTIVITAMIN W/MINERALS CH
1.0000 | ORAL_TABLET | Freq: Every day | ORAL | Status: DC
  Administered 2021-03-18 – 2021-03-22 (×5): 1 via ORAL
  Filled 2021-03-16 (×8): qty 1

## 2021-03-16 MED ORDER — LOPERAMIDE HCL 2 MG PO CAPS
2.0000 mg | ORAL_CAPSULE | ORAL | Status: DC | PRN
Start: 2021-03-16 — End: 2021-03-16

## 2021-03-16 NOTE — Progress Notes (Signed)
CSW notes that provider has contacted [facility] for any possible bed availability at [facility].  Assunta Curtis, MSW, LCSW 03/16/2021 12:05 PM

## 2021-03-16 NOTE — Tx Team (Addendum)
Initial Treatment Plan 03/16/2021 7:31 PM [name] [name] FYT:[id number]    PATIENT STRESSORS: Financial difficulties Medication change or noncompliance Substance abuse   PATIENT STRENGTHS: Ability for insight Active sense of humor Supportive family/friends   PATIENT IDENTIFIED PROBLEMS: Suicidal ideation  Drinking problems  Medication non-adherence                 DISCHARGE CRITERIA:  Motivation to continue treatment in a less acute level of care  PRELIMINARY DISCHARGE PLAN: Attend 12-step recovery group  PATIENT/FAMILY INVOLVEMENT: This treatment plan has been presented to and reviewed with the patient, [name] [name].  The patient has been given the opportunity to ask questions and make suggestions.  Luna Glasgow, RN 03/16/2021, 7:31 PM

## 2021-03-16 NOTE — ED Provider Notes (Signed)
FBC/OBS ASAP Discharge Summary  Date and Time: 03/16/2021 3:13 PM  Name: [name] [name]  MRN:  [id number]   Discharge Diagnoses:  Final diagnoses:  Alcohol dependence with uncomplicated withdrawal ([place])  Generalized anxiety disorder  Severe episode of recurrent major depressive disorder, without psychotic features [facility])    Subjective: 60 year old male with past psychiatric history of alcohol use disorder, major depressive disorder versus substance-induced mood disorder who presented voluntarily to [facility] urgent care from [place] under alcohol influence with suicidal ideations, unable to contract for safety. Patient states that he is originally from [place], moved to [place] but moved back yesterday to [place] and was living in [place].  This morning he started having suicidal ideations which are getting worse.  He states that he has been having suicidal ideations from couple of weeks now and needs immediate help.  He states last time he drank alcohol was yesterday~8 beers and he drinks every day ~12 packs.  He is endorsing depressed mood, anxiety, poor sleep, poor appetite~lost 10 pounds recently.  He denies any paranoia, auditory or visual hallucinations.  He denies any past suicide attempt.  He states he has not been taking any prescribed medication for anxiety or depression.  He denies any verbal/physical/sexual abuse in past.  He states he has lost his employment, currently homeless, hopeless, worthless and contemplating suicide.  He states he has tried substance abuse treatment in the past and failed multiple times.  Stay Summary: Patient was at [facility] for 7 hours and was accepted to [facility]  Total Time spent with patient: 20 minutes  Past Psychiatric History: Alcohol abuse Past Medical History:  Past Medical History:  Diagnosis Date  . Alcohol abuse    Last Drink on Sep 2018  . Anxiety    About 60 years of age  . Carpal tunnel syndrome of right wrist Dx 2015  .  Depression    At 59 years of age  . GERD (gastroesophageal reflux disease) Dx 2007  . Hepatitis, alcoholic, acute 6433  . Hyperlipidemia    borderline, diet controlled  . Pancreatitis Dx 2014  . Pneumonia 11/2014  . 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 Reflux Dx 2007  . 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 carpel tunnel release Right 07/28/2014    done in [place]   . ELBOW SURGERY    . KNEE ARTHROSCOPY    . KNEE SURGERY Left    15 years ago  . LIPOMA EXCISION N/A 05/10/2016   Procedure: EXCISION OF SCALP LIPOMA;  Surgeon: Johnathan Hausen, MD;  Location: [place];  Service: General;  Laterality: N/A;   Family History:  Family History  Problem Relation Age of Onset  . Diabetes Father   . Hypertension Father   . Hypertension Paternal Grandfather   . Alcohol abuse Paternal Grandfather   . Alcohol abuse Maternal Grandfather   . Alcohol abuse Maternal Grandmother   . Alcohol abuse Paternal Grandmother   . Colon cancer Neg Hx   . Rectal cancer Neg Hx   . Stomach cancer Neg Hx   . Bipolar disorder Neg Hx   . Depression Neg Hx    Family Psychiatric History: See abive Social History:  Social History   Substance and Sexual Activity  Alcohol Use Yes  . Alcohol/week: 0.0 standard drinks   Comment: last use today 05/03/18      Social History   Substance and  Sexual Activity  Drug Use Not Currently  . Types: Marijuana   Comment: Patient denies     Social History   Socioeconomic History  . Marital status: Legally Separated    Spouse name: Not on file  . Number of children: 2  . Years of education: 13 yrs  . Highest education level: Not on file  Occupational History  . Occupation: custodian    Comment: [employer]  Tobacco Use  . Smoking status: Never Smoker  . Smokeless tobacco: Never Used  Vaping Use  . Vaping Use: Never used  Substance and Sexual Activity  . Alcohol use:  Yes    Alcohol/week: 0.0 standard drinks    Comment: last use today 05/03/18   . Drug use: Not Currently    Types: Marijuana    Comment: Patient denies   . Sexual activity: Not on file  Other Topics Concern  . Not on file  Social History Narrative   Born and raised in [place], [place] by both parents. He has 2 sisters and is the middle child. He remains close with family. He was married for 16 yrs and seperated in 2005. He has 2 boys who are in their 20's now. He lives alone in [place]. He is working as a Horticulturist, commercial: Not on [employer] Insecurity: Not on file  Transportation Needs: Not on file  Physical Activity: Not on file  Stress: Not on file  Social Connections: Not on file   SDOH:  SDOH Screenings   Alcohol Screen: Not on file  Depression (PHQ2-9): Medium Risk  . PHQ-2 Score: 13  Financial Resource Strain: Not on file  Food Insecurity: Not on file  Housing: Not on file  Physical Activity: Not on file  Social Connections: Not on file  Stress: Not on file  Tobacco Use: Not on file  Transportation Needs: Not on file    Has this patient used any form of tobacco in the last 30 days? (Cigarettes, Smokeless Tobacco, Cigars, and/or Pipes) A prescription for an FDA-approved tobacco cessation medication was offered at discharge and the patient refused  Current Medications:  Current Facility-Administered Medications  Medication Dose Route Frequency Provider Last Rate Last Admin  . acetaminophen (TYLENOL) tablet 650 mg  650 mg Oral Q6H PRN Dagar, Meredith Staggers, MD      . alum & mag hydroxide-simeth (MAALOX/MYLANTA) 200-200-20 MG/5ML suspension 30 mL  30 mL Oral Q4H PRN Dagar, Meredith Staggers, MD      . hydrOXYzine (ATARAX/VISTARIL) tablet 25 mg  25 mg Oral TID PRN Dagar, Meredith Staggers, MD      . loperamide (IMODIUM) capsule 2-4 mg  2-4 mg Oral PRN Dagar, Meredith Staggers, MD      . LORazepam (ATIVAN) tablet 1 mg  1 mg Oral Q6H PRN Dagar, Meredith Staggers, MD      .  LORazepam (ATIVAN) tablet 1 mg  1 mg Oral QID Dagar, Meredith Staggers, MD   1 mg at 03/16/21 1006   Followed by  . [START ON 03/17/2021] LORazepam (ATIVAN) tablet 1 mg  1 mg Oral TID Dagar, Meredith Staggers, MD       Followed by  . [START ON 03/18/2021] LORazepam (ATIVAN) tablet 1 mg  1 mg Oral BID Dagar, Meredith Staggers, MD       Followed by  . [START ON 03/19/2021] LORazepam (ATIVAN) tablet 1 mg  1 mg Oral Daily Dagar, Anjali, MD      . magnesium hydroxide (MILK OF MAGNESIA) suspension  30 mL  30 mL Oral Daily PRN Dagar, Meredith Staggers, MD      . multivitamin with minerals tablet 1 tablet  1 tablet Oral Daily Dagar, Anjali, MD   1 tablet at 03/16/21 1007  . ondansetron (ZOFRAN-ODT) disintegrating tablet 4 mg  4 mg Oral Q6H PRN Dagar, Meredith Staggers, MD      . thiamine (B-1) injection 100 mg  100 mg Intramuscular Once Dagar, Meredith Staggers, MD      . Derrill Memo ON 03/17/2021] thiamine tablet 100 mg  100 mg Oral Daily Dagar, Anjali, MD      . traZODone (DESYREL) tablet 50 mg  50 mg Oral QHS PRN Dagar, Meredith Staggers, MD       Current Outpatient Medications  Medication Sig Dispense Refill  . amLODipine (NORVASC) 5 MG tablet Take 5 mg by mouth daily.    Marland Kitchen atorvastatin (LIPITOR) 20 MG tablet Take 20 mg by mouth daily.      PTA Medications: (Not in a hospital admission)   Musculoskeletal  Strength & Muscle Tone: within normal limits Gait & Station: normal Patient leans: N/A  Psychiatric Specialty Exam  Presentation  General Appearance: Disheveled  Eye Contact:Fair  Speech:Slurred  Speech Volume:Decreased  Handedness:Right   Mood and Affect  Mood:Anxious; Depressed; Hopeless; Worthless  Affect:Tearful; Depressed   Thought Process  Thought Processes:Coherent  Descriptions of Associations:Intact  Orientation:Full (Time, Place and Person)  Thought Content:Logical  Diagnosis of Schizophrenia or Schizoaffective disorder in past: No    Hallucinations:Hallucinations: None  Ideas of Reference:None  Suicidal Thoughts:Suicidal Thoughts:  Yes, Active SI Active Intent and/or Plan: With Intent  Homicidal Thoughts:Homicidal Thoughts: No   Sensorium  Memory:Immediate Fair; Recent Fair; Remote Industry   Executive Functions  Concentration:Good  Attention Span:Good  [place]  Language:Good   Psychomotor Activity  Psychomotor Activity:Psychomotor Activity: Restlessness; Tremor   Assets  Assets:Communication Skills; Desire for Improvement; Resilience   Sleep  Sleep:Sleep: Poor   Nutritional Assessment (For OBS and FBC admissions only) Has the patient had a weight loss or gain of 10 pounds or more in the last 3 months?: Yes Has the patient had a decrease in food intake/or appetite?: Yes Does the patient have dental problems?: No Does the patient have eating habits or behaviors that may be indicators of an eating disorder including binging or inducing vomiting?: No Has the patient recently lost weight without trying?: Yes, 2-13 lbs. Has the patient been eating poorly because of a decreased appetite?: Yes Malnutrition Screening Tool Score: 2    Physical Exam  Physical Exam Vitals and nursing note reviewed.  Constitutional:      Appearance: He is well-developed.  HENT:     Head: Normocephalic.  Eyes:     Pupils: Pupils are equal, round, and reactive to light.  Cardiovascular:     Rate and Rhythm: Normal rate.  Pulmonary:     Effort: Pulmonary effort is normal.  Musculoskeletal:        General: Normal range of motion.  Neurological:     Mental Status: He is alert and oriented to person, place, and time.    Review of Systems  Constitutional: Positive for malaise/fatigue.  HENT: Negative.   Eyes: Positive for redness.  Respiratory: Negative.   Cardiovascular: Negative.   Gastrointestinal: Positive for nausea.  Genitourinary: Negative.   Musculoskeletal: Positive for myalgias.  Skin: Negative.   Neurological: Positive for tremors  and headaches.  Endo/Heme/Allergies: Negative.   Psychiatric/Behavioral: Positive for depression, substance abuse and  suicidal ideas. The patient is nervous/anxious.    Blood pressure (!) 142/109, pulse 100, temperature 98.7 F (37.1 C), temperature source Oral, resp. rate 16, SpO2 100 %. There is no height or weight on file to calculate BMI.  Disposition: Discharge to [place], [place] 03/16/2021, 3:13 PM

## 2021-03-16 NOTE — Progress Notes (Signed)
Psychoeducational Group Note  Date:  03/16/2021 Time:2015 Group Topic/Focus:  wrap up group  Participation Level: Did Not Attend  Participation Quality:  Not Applicable  Affect:  Not Applicable  Cognitive:  Not Applicable  Insight:  Not Applicable  Engagement in Group: Not Applicable  Additional Comments: Did not attend.   Shellia Cleverly 03/16/2021, 9:47 PM

## 2021-03-16 NOTE — ED Notes (Addendum)
Pt transferred to [facility] via safe transport. All belongings given to safe transport driver. Safety maintained.

## 2021-03-16 NOTE — Progress Notes (Signed)
Pt is a 60 y.o. male who voluntarily admitted  [place] at [facility].  Pt was admitted because of SI and substance use disorder.  Pt said his drug of choice is alcohol - "ICE BEERS".  Pt drinks at least a  12 pack of beer per day.  Pt was living at [facility] in [place], [place] and because of comments pt made about ending pt's life, [facility] staff encouraged pt to go to [facility] for evaluation.  Pt said his goal was to start attending [place] meetings and get back on medications.    Pt said his mom is supportive and his two adult sons are also supportive.  Sons live in [place], [place].  RN initiated q 15 min safety checks.  RN oriented pt to unit, room, and staff members.

## 2021-03-16 NOTE — ED Notes (Signed)
Pt given snack. 

## 2021-03-16 NOTE — ED Notes (Addendum)
Pt admitted to continuous assessment endorsing passive SI no plan and requesting ETOH detox. CIWA 11 on admission. Cooperative throughout assessment. Pt states, "I'm just tired of going thru this. I need help with my drinking". Support given. Oriented to unit and unit rules. Will continue to monitor.

## 2021-03-16 NOTE — Progress Notes (Signed)
Pt to [facility] voluntarily with staff from [facility] due to SI and alcohol use. Pt reports drinking close to a 12 pack of beer daily for the past few months. Pt reports going to [place] last night after drinking all day yesterday. Pt reports SI, no plan. Pt reports symptoms of difficulty sleeping, hands shaking, feeling anxious, vomiting, ringing in his ear and difficulty eating. Pt reports diagnosis of anxiety and depression and is not taking any medications. Pt denies HI, AVH.  Pt is urgent

## 2021-03-16 NOTE — ED Notes (Signed)
Pt given meal

## 2021-03-16 NOTE — ED Notes (Signed)
Report given to Will, RN @[facility] . Safe transport called

## 2021-03-16 NOTE — Discharge Instructions (Addendum)
Discharge to [facility] 

## 2021-03-16 NOTE — BH Assessment (Signed)
Comprehensive Clinical Assessment (CCA) Note  03/16/2021 [name] [name] [id number]  Disposition:  Gave clinical report to A. Dagar, MD, who determined that Pt is to be admitted to [facility] for stabilization and medication management.  The patient demonstrates the following risk factors for suicide: Chronic risk factors for suicide include: substance use disorder. Acute risk factors for suicide include: unemployment. Protective factors for this patient include: positive social support. Considering these factors, the overall suicide risk at this point appears to be low. Patient is not appropriate for outpatient follow up.  [facility] from 03/16/2021 in [facility] Admission (Discharged) from 05/04/2018 in [place] [facility] ED from 05/03/2018 in [place] Low Risk High Risk High Risk     Chief Complaint:  Chief Complaint  Patient presents with  . Suicidal    Pt also experiencing what appears to be alcohol withdrawal  . Alcohol Problem  . Urgent Emergent Evaluation   Visit Diagnosis: Alcohol Use, Unspecified, with depressive symptoms  NARRATIVE:  Pt is a 60 year old male who presented to [facility] on a voluntary basis (referred by [facility]) with complaint of suicidal ideation and withdrawal symptoms.  Pt came to [facility] on 03/15/2021 from [place] for treatment of alcohol use.  Pt stated that on 03/15/2021, he ingested about eight 12 oz beers, and that he usually ingests a 12-pack of beer per day.  Pt reported also that for about two weeks, he has experienced suicidal ideation (currently without plan or intent), persistent and unremitting despondency, anxiety, insomnia, and poor appetite.  Pt stated that he has been prescribed medication for treatment of anxiety and depression before, but he is not now currently taking medication.  Pt denied hallucination, homicidal ideation, and  self-injurious behavior.  Pt denied past suicide attempts and trauma.  Pt was observed to have tremors in both hands, and he appeared restless and in physical distress.  He reported that he is scared of ''voices in my head'' that urge him to harm himself.  Pt stated that he has been through detox before, and that he has not been admitted to a psychiatric facility before.  During assessment, Pt presented as alert and oriented.  He had good eye contact and was cooperative.  Pt was dressed in street clothes, and he appeared appropriately groomed.  Pt's mood was anxious and sad.  Affect was full range.  Pt's speech was normal in rate, rhythm, and volume.  Thought processes were within normal range, and thought content was logical and goal-oriented.  There was no evidence of delusion.  Memory and concentration were intact.  Insight and judgment were fair.  Impulse control was poor as evidenced by ongoing alcohol use.   CCA Screening, Triage and Referral (STR)  Patient Reported Information How did you hear about [place]? Other (Comment) [facility])  Referral name: No data recorded Referral phone number: No data recorded  Whom do you see for routine medical problems? No data recorded Practice/Facility Name: No data recorded Practice/Facility Phone Number: No data recorded Name of Contact: No data recorded Contact Number: No data recorded Contact Fax Number: No data recorded Prescriber Name: No data recorded Prescriber Address (if known): No data recorded  What Is the Reason for Your Visit/Call Today? SI, alcohol problems  How Long Has This Been Causing You Problems? 1 wk - 1 month  What Do You Feel Would Help You the Most Today? Alcohol or  Drug Use Treatment; Treatment for Depression or other mood problem   Have You Recently Been in Any Inpatient Treatment (Hospital/Detox/Crisis Center/28-Day Program)? No data recorded Name/Location of Program/Hospital:No data recorded How Long Were You  There? No data recorded When Were You Discharged? No data recorded  Have You Ever Received Services From [facility] Before? No data recorded Who Do You See at [facility]? No data recorded  Have You Recently Had Any Thoughts About Hurting Yourself? Yes  Are You Planning to Commit Suicide/Harm Yourself At This time? No   Have you Recently Had Thoughts About [place]? No  Explanation: No data recorded  Have You Used Any Alcohol or Drugs in the Past 24 Hours? Yes  How Long Ago Did You Use Drugs or Alcohol? No data recorded What Did You Use and How Much? 12 pack of beer   Do You Currently Have a Therapist/Psychiatrist? No data recorded Name of Therapist/Psychiatrist: No data recorded  Have You Been Recently Discharged From Any Office Practice or Programs? No data recorded Explanation of Discharge From Practice/Program: No data recorded    CCA Screening Triage Referral Assessment Type of Contact: No data recorded Is this Initial or Reassessment? No data recorded Date Telepsych consult ordered in [facility]:  No data recorded Time Telepsych consult ordered in [facility]:  No data recorded  Patient Reported Information Reviewed? No data recorded Patient Left Without Being Seen? No data recorded Reason for Not Completing Assessment: No data recorded  Collateral Involvement: No data recorded  Does Patient Have a [place]? No data recorded Name and Contact of Legal Guardian: No data recorded If Minor and Not Living with Parent(s), Who has Custody? No data recorded Is CPS involved or ever been involved? No data recorded Is APS involved or ever been involved? No data recorded  Patient Determined To Be At Risk for Harm To Self or Others Based on Review of Patient Reported Information or Presenting Complaint? No data recorded Method: No data recorded Availability of Means: No data recorded Intent: No data recorded Notification Required: No data  recorded Additional Information for Danger to Others Potential: No data recorded Additional Comments for Danger to Others Potential: No data recorded Are There Guns or Other Weapons in Your Home? No data recorded Types of Guns/Weapons: No data recorded Are These Weapons Safely Secured?                            No data recorded Who Could Verify You Are Able To Have These Secured: No data recorded Do You Have any Outstanding Charges, Pending Court Dates, Parole/Probation? No data recorded Contacted To Inform of Risk of Harm To Self or Others: No data recorded  Location of Assessment: No data recorded  Does Patient Present under Involuntary Commitment? No data recorded IVC Papers Initial File Date: No data recorded  [place] of Residence: No data recorded  Patient Currently Receiving the Following Services: No data recorded  Determination of Need: Urgent (48 hours)   Options For Referral: Medication Management; Outpatient Therapy     CCA Biopsychosocial Intake/Chief Complaint:  Suicidal ideation without plan or intent; alcohol use and withdrawal  Current Symptoms/Problems: Pt endorsed suicidal ideation; appeared to be experiencing withdrawal (tremors)   Patient Reported Schizophrenia/Schizoaffective Diagnosis in Past: No   Strengths: Some insight  Preferences: No data recorded Abilities: No data recorded  Type of Services Patient Feels are Needed: Med management -- requested medication to  help feel calmer and to help with ''voices in head'' re: suicidal ideation   Initial Clinical Notes/Concerns: Pt endorsed suicidal ideation without plan or intent; also endorsed alcohol use and withdrawal symptoms   Mental Health Symptoms Depression:  Hopelessness; Increase/decrease in appetite; Sleep (too much or little); Tearfulness; Weight gain/loss; Change in energy/activity   Duration of Depressive symptoms: Greater than two weeks   Mania:  N/A   Anxiety:   Worrying    Psychosis:  None   Duration of Psychotic symptoms: No data recorded  Trauma:  N/A   Obsessions:  N/A   Compulsions:  N/A   Inattention:  N/A   Hyperactivity/Impulsivity:  N/A   Oppositional/Defiant Behaviors:  N/A   Emotional Irregularity:  Potentially harmful impulsivity   Other Mood/Personality Symptoms:  No data recorded   Mental Status Exam Appearance and self-care  Stature:  Average   Weight:  Average weight   Clothing:  Casual   Grooming:  Normal   Cosmetic use:  None   Posture/gait:  Normal   Motor activity:  Tremor   Sensorium  Attention:  Normal   Concentration:  Normal   Orientation:  X5   Recall/memory:  Normal   Affect and Mood  Affect:  Full Range   Mood:  Anxious; Depressed   Relating  Eye contact:  Normal   Facial expression:  Responsive   Attitude toward examiner:  Cooperative   Thought and Language  Speech flow: Clear and Coherent   Thought content:  Appropriate to Mood and Circumstances   Preoccupation:  None   Hallucinations:  None   Organization:  No data recorded  [employer] of Knowledge:  Average   Intelligence:  Average   Abstraction:  Normal   Judgement:  Fair   Art therapist:  Adequate   Insight:  Fair   Decision Making:  Impulsive (As evidenced by alcohol use)   Social Functioning  Social Maturity:  Impulsive   Social Judgement:  Heedless   Stress  Stressors:  Work (Pt is unemployed)   Coping Ability:  Programme researcher, broadcasting/film/video Deficits:  Responsibility   Supports:  Friends/Service system (Pt came to [employer] to go to [employer])     Religion:    Leisure/Recreation: Leisure / Recreation Do You Have Hobbies?: No  Exercise/Diet: Exercise/Diet Do You Exercise?: No Have You Gained or Lost A Significant Amount of Weight in the Past Six Months?: Yes-Lost Number of Pounds Lost?: 10 Do You Follow a Special Diet?: No Do You Have Any Trouble Sleeping?: Yes Explanation of Sleeping  Difficulties: Poor sleep over the last week   CCA Employment/Education Employment/Work Situation: Employment / Work Situation Employment situation: Unemployed What is the longest time patient has a held a job?: 14 years 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  Has patient ever been in the [employer]?: No  Education: Education Is Patient Currently Attending School?: No   CCA Family/Childhood History Family and Relationship History: Family history Marital status: Separated Separated, when?: "10 years or a little over What types of issues is patient dealing with in the relationship?: Distant Are you sexually active?: No What is your sexual orientation?: Heterosexual Does patient have children?: Yes How many children?: 2 How is patient's relationship with their children?: Two sons  Childhood History:  Childhood History By whom was/is the patient raised?: Both parents Additional childhood history information: Per history, ''My parents were married when I was a child.I never  wanted for anything. They were hardworking and were wonderful parents." Description of patient's relationship with caregiver when they were a child: Close Patient's description of current relationship with people who raised him/her: Good Does patient have siblings?: Yes Number of Siblings: 2 Description of patient's current relationship with siblings: OK -- one sibling in [place] Did patient suffer any verbal/emotional/physical/sexual abuse as a child?: No Did patient suffer from severe childhood neglect?: No Has patient ever been sexually abused/assaulted/raped as an adolescent or adult?: No Was the patient ever a victim of a crime or a disaster?: No Witnessed domestic violence?: No Has patient been affected by domestic violence as an adult?: No  Child/Adolescent Assessment:     CCA Substance Use Alcohol/Drug Use: Alcohol / Drug Use Pain  Medications: Please see MAR Prescriptions: Please see MAR Over the Counter: Please see MAR History of alcohol / drug use?: Yes Longest period of sobriety (when/how long): 1 year Negative Consequences of Use: Work / Youth worker Withdrawal Symptoms: Tremors,Weakness,Nausea / Vomiting Substance #1 Name of Substance 1: Alcohol 1 - Amount (size/oz): Varied -- up to a 12 pack per day 1 - Frequency: Daily 1 - Duration: Ongoing 1 - Last Use / Amount: 03/15/2021 -- 8 12 oz beers 1 - Method of Aquiring: Purchase 1- Route of Use: ingestion                       ASAM's:  Six Dimensions of Multidimensional Assessment  Dimension 1:  Acute Intoxication and/or Withdrawal Potential:   Dimension 1:  Description of individual's past and current experiences of substance use and withdrawal: Ongoing use of alcohol; currently withdrawaing  Dimension 2:  Biomedical Conditions and Complications:   Dimension 2:  Description of patient's biomedical conditions and  complications: None indicated  Dimension 3:  Emotional, Behavioral, or Cognitive Conditions and Complications:  Dimension 3:  Description of emotional, behavioral, or cognitive conditions and complications: Pt endorsed suicdial ideation, despondency  Dimension 4:  Readiness to Change:  Dimension 4:  Description of Readiness to Change criteria: Moderate  Dimension 5:  Relapse, Continued use, or Continued Problem Potential:  Dimension 5:  Relapse, continued use, or continued problem potential critiera description: Significant risk of relapse; Pt has relapsed before  Dimension 6:  Recovery/Living Environment:  Dimension 6:  Recovery/Iiving environment criteria description: Mudlogger Severity Score: ASAM's Severity Rating Score: 10  ASAM Recommended Level of Treatment: ASAM Recommended Level of Treatment: Level II Intensive Outpatient Treatment   Substance use Disorder (SUD) Substance Use Disorder (SUD)  Checklist Symptoms of  Substance Use: Evidence of withdrawal (Comment),Persistent desire or unsuccessful efforts to cut down or control use,Presence of craving or strong urge to use  Recommendations for Services/Supports/Treatments: Recommendations for Services/Supports/Treatments Recommendations For Services/Supports/Treatments: Residential-Level 2,CD-IOP Intensive Chemical Dependency Program,Detox  DSM5 Diagnoses: Patient Active Problem List   Diagnosis Date Noted  . MDD (major depressive disorder), recurrent episode, severe ([place]) 06/30/2017  . Lateral epicondylitis of right elbow 01/05/2017  . Strain, MCP, hand, right, initial encounter 01/05/2017  . 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 Bilateral knee pain 01/11/2016  . Chronic pain of right wrist 07/07/2015  . Carpal tunnel syndrome of right wrist 05/20/2015  . Dislocation of right shoulder joint 05/20/2015  . Tear of medial meniscus of right knee 03/24/2015  . 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[place]) 01/27/2015  . Chronic cough 01/15/2015  . Onychomycosis of toenail 12/15/2014  . Seborrhea 11/11/2014  . GERD (gastroesophageal reflux disease)   . 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 Alcohol abuse 01/19/2014  . Alcohol dependence ([place]) 09/02/2013  . Generalized anxiety disorder 09/02/2013    Patient Centered Plan: Patient is on the following Treatment Plan(s):     Referrals to Alternative Service(s): Referred to Alternative Service(s):   Place:   Date:   Time:    Referred to Alternative Service(s):   Place:   Date:   Time:    Referred to Alternative Service(s):   Place:   Date:   Time:    Referred to Alternative Service(s):   Place:   Date:   Time:     Marlowe Aschoff, [facility]

## 2021-03-16 NOTE — ED Provider Notes (Signed)
Behavioral Health Admission H&P [facility] & OBS)  Date: 03/16/21 Patient Name: [name] [name] MRN: [id number] Chief Complaint:  Chief Complaint  Patient presents with  . Suicidal    Pt also experiencing what appears to be alcohol withdrawal  . Alcohol Problem  . Urgent Emergent Evaluation   Chief Complaint/Presenting Problem: Suicidal ideation without plan or intent; alcohol use and withdrawal  Diagnoses:  Final diagnoses:  None    HPI: [name] [name] is a 60 year old male with past psychiatric history of alcohol use disorder, major depressive disorder versus substance-induced mood disorder who presented voluntarily to [facility] urgent care from [place] under alcohol influence with suicidal ideations, unable to contract for safety. Patient states that he is originally from [place], moved to [place] but moved back yesterday to [place] and was living in [place].  This morning he started having suicidal ideations which are getting worse.  He states that he has been having suicidal ideations from couple of weeks now and needs immediate help.  He states last time he drank alcohol was yesterday~8 beers and he drinks every day ~12 packs.  He is endorsing depressed mood, anxiety, poor sleep, poor appetite~lost 10 pounds recently.  He denies any paranoia, auditory or visual hallucinations.  He denies any past suicide attempt.  He states he has not been taking any prescribed medication for anxiety or depression.  He denies any verbal/physical/sexual abuse in past.  He states he has lost his employment, currently homeless, hopeless, worthless and contemplating suicide.  He states he has tried substance abuse treatment in the past and failed multiple times. Past psychiatric history: Previous medication trials: Effexor, Lexapro Previous psychiatric hospitalization: Yes Previous suicide attempts: No History of violence: No Outpatient psychiatrist: No  Social history: Marital status: Not  married Children: 0 Source of income: None Housing status: [place] History of physical/sexual/verbal abuse: No Easy access to gun: No  Substance use: Recreational drug: Denies Use of alcohol: Every day~12 packs Tobacco use: No Rehab history: Yes History of complicated withdrawal: Only tremors, no seizures or hallucinations  Legal history: Past charges/incarceration: No Pending charges: No  Family psychiatric history:  Substance abuse runs in family   PHQ 2-9:  [place] ED from 03/16/2021 in [facility] Office Visit from 12/19/2017 in [place] Visit from 11/12/2017 in [place]  Thoughts that you would be better off dead, or of hurting yourself in some way Several days Not at all Not at all  PHQ-9 Total Score 13 0 4      [place] ED from 03/16/2021 in [facility] Admission (Discharged) from 05/04/2018 in [place] [facility] ED from 05/03/2018 in [place] Risk High Risk High Risk       Total Time spent with patient: 20 minutes  Musculoskeletal  Strength & Muscle Tone: within normal limits Gait & Station: normal Patient leans: N/A  Psychiatric Specialty Exam  Presentation General Appearance: Disheveled  Eye Contact:Fair  Speech:Slurred  Speech Volume:Decreased  Handedness:Right   Mood and Affect  Mood:Anxious; Depressed; Hopeless; Worthless  Affect:Tearful; Depressed   Thought Process  Thought Processes:Coherent  Descriptions of Associations:Intact  Orientation:Full (Time, Place and Person)  Thought Content:Logical  Diagnosis of Schizophrenia or Schizoaffective disorder in past: No   Hallucinations:Hallucinations: None  Ideas of Reference:None  Suicidal Thoughts:Suicidal Thoughts: Yes, Active SI Active Intent and/or  Plan:  With Intent  Homicidal Thoughts:Homicidal Thoughts: No   Sensorium  Memory:Immediate Fair; Recent Fair; Remote [place]   Executive Functions  Concentration:Good  Attention Span:Good  [place]  Language:Good   Psychomotor Activity  Psychomotor Activity:Psychomotor Activity: Restlessness; Tremor   Assets  Assets:Communication Skills; Desire for Improvement; Resilience   Sleep  Sleep:Sleep: Poor   Nutritional Assessment (For OBS and [facility]BC admissions only) Has the patient had a weight loss or gain of 10 pounds or more in the last 3 months?: Yes Has the patient had a decrease in food intake/or appetite?: Yes Does the patient have dental problems?: No Does the patient have eating habits or behaviors that may be indicators of an eating disorder including binging or inducing vomiting?: No Has the patient recently lost weight without trying?: Yes, 2-13 lbs. Has the patient been eating poorly because of a decreased appetite?: Yes Malnutrition Screening Tool Score: 2    Physical Exam Vitals and nursing note reviewed.  Constitutional:      General: He is in acute distress.  HENT:     Head: Normocephalic and atraumatic.     Mouth/Throat:     Mouth: Mucous membranes are dry.  Cardiovascular:     Rate and Rhythm: Tachycardia present.  Pulmonary:     Effort: Pulmonary effort is normal.  Musculoskeletal:        General: Normal range of motion.     Cervical back: Normal range of motion.  Neurological:     Mental Status: He is oriented to person, place, and time.    Review of Systems  Constitutional: Positive for chills and malaise/fatigue.  HENT: Negative.   Eyes: Positive for redness.  Respiratory: Negative.   Cardiovascular: Negative.   Gastrointestinal: Positive for nausea.  Genitourinary: Negative.   Musculoskeletal: Positive for myalgias.  Neurological: Positive for tremors and headaches.   Psychiatric/Behavioral: Positive for depression, substance abuse and suicidal ideas. The patient is nervous/anxious and has insomnia.     Blood pressure (!) 160/106, pulse 92, temperature 98.1 F (36.7 C), temperature source Oral, resp. rate 20, SpO2 100 %. There is no height or weight on file to calculate BMI.   Is the patient at risk to self? Yes  Has the patient been a risk to self in the past 6 months? No .    Has the patient been a risk to self within the distant past? Yes   Is the patient a risk to others? No   Has the patient been a risk to others in the past 6 months? No   Has the patient been a risk to others within the distant past? No   Past Medical History:  Past Medical History:  Diagnosis Date  . Alcohol abuse    Last Drink on Sep 2018  . Anxiety    About 60 years of age  . Carpal tunnel syndrome of right wrist Dx 2015  . Depression    At 60 years of age  . GERD (gastroesophageal reflux disease) Dx 2007  . Hepatitis, alcoholic, acute 4008  . Hyperlipidemia    borderline, diet controlled  . Pancreatitis Dx 2014  . Pneumonia 11/2014  . 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 Reflux Dx 2007  . 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 carpel tunnel release Right 07/28/2014    done in [place]   . ELBOW SURGERY    . KNEE  ARTHROSCOPY    . KNEE SURGERY Left    15 years ago  . LIPOMA EXCISION N/A 05/10/2016   Procedure: EXCISION OF SCALP LIPOMA;  Surgeon: Johnathan Hausen, MD;  Location: [place];  Service: General;  Laterality: N/A;    Family History:  Family History  Problem Relation Age of Onset  . Diabetes Father   . Hypertension Father   . Hypertension Paternal Grandfather   . Alcohol abuse Paternal Grandfather   . Alcohol abuse Maternal Grandfather   . Alcohol abuse Maternal Grandmother   . Alcohol abuse Paternal Grandmother   . Colon cancer Neg Hx   . Rectal cancer Neg  Hx   . Stomach cancer Neg Hx   . Bipolar disorder Neg Hx   . Depression Neg Hx     Social History:  Social History   Socioeconomic History  . Marital status: Legally Separated    Spouse name: Not on file  . Number of children: 2  . Years of education: 13 yrs  . Highest education level: Not on file  Occupational History  . Occupation: custodian    Comment: [employer]  Tobacco Use  . Smoking status: Never Smoker  . Smokeless tobacco: Never Used  Vaping Use  . Vaping Use: Never used  Substance and Sexual Activity  . Alcohol use: Yes    Alcohol/week: 0.0 standard drinks    Comment: last use today 05/03/18   . Drug use: Not Currently    Types: Marijuana    Comment: Patient denies   . Sexual activity: Not on file  Other Topics Concern  . Not on file  Social History Narrative   Born and raised in [place], [place] by both parents. He has 2 sisters and is the middle child. He remains close with family. He was married for 16 yrs and seperated in 2005. He has 2 boys who are in their 20's now. He lives alone in [place]. 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 PHQ-2 Score: 13  Financial Resource Strain: Not on file  Food Insecurity: Not on file  Housing: Not on file  Physical Activity: Not on file  Social Connections: Not on file  Stress: Not on file  Tobacco Use: Not on file  Transportation Needs: Not on file    Last Labs:  Admission on 03/16/2021  Component Date Value Ref Range Status  . SARS Coronavirus 2 Ag 03/16/2021 NEGATIVE  NEGATIVE Final   Comment: (NOTE) SARS-CoV-2 antigen NOT DETECTED.   Negative results are  presumptive.  Negative results do not preclude SARS-CoV-2 infection and should not be used as the sole basis for treatment or other patient management decisions, including infection  control decisions, particularly in the presence of clinical signs and  symptoms consistent with COVID-19, or in those who have been in contact with the virus.  Negative results must be combined with clinical observations, patient history, and epidemiological information. The expected result is Negative.  Fact Sheet for Patients: HandmadeRecipes.com.cy  Fact Sheet for Healthcare Providers: FuneralLife.at  This test is not yet approved or cleared by the  [place] Architectural technologist and  has been authorized for detection and/or diagnosis of SARS-CoV-2 by FDA under an Print production planner (EUA).  This EUA will remain in effect (meaning this test can be used) for the duration of  the COV                          ID-19 declaration under Section 564(b)(1) of the Act, 21 U.S.C. section 360bbb-3(b)(1), unless the authorization is terminated or revoked sooner.      Allergies: Oxycodone  PTA Medications: (Not in a hospital admission)   Medical Decision Making  Assessment: 60 year old male with past psychiatric history significant for alcohol use disorder, major depressive disorder versus substance-induced mood disorder presented to [facility] urgent care voluntarily under alcohol influence for ongoing suicidal ideations which are getting worse. -- On evaluation patient admits to suicidal ideations, no concrete plan but states that they are getting worse and he needs help.  He denies homicidal ideation or any auditory or visual hallucinations.  He is living in [place], from yesterday moved from [place].  He is endorsing depression, hopelessness, worthlessness, low self-esteem, poor appetite, poor sleep, recurrent suicide thoughts and severe alcohol dependence.  He  has had past psychiatric hospitalizations.  He is alert, awake, oriented to time place and person but has significant tremors, nausea, sweats, hopelessness, hopelessness and worthlessness. --He will be admitted to urgent care for overnight observation for safe detoxification from alcohol and for further evaluation tomorrow morning. --He is COVID-negative, awaiting other lab results --Patient is started on Ativan detox protocol due to his unknown hepatic panel.  Labs from 2019 shows AST 59, ALT 25 but due to patient's regular drinking there is concern about and hepatic panel.  He is provided with folic acid, thiamine, multivitamins.  Vistaril and trazodone available as needed for anxiety and insomnia.  Recommendations  Based on my evaluation the patient does not appear to have an emergency medical condition. -- Patient would benefit from overnight observation and reevaluation tomorrow morning.  Detox protocol with Ativan as an action.  Honor Junes, MD 03/16/21  10:02 AM  PGY-1, Resident

## 2021-03-16 NOTE — Progress Notes (Signed)
Patient was accepted to [facility].    Meets inpatient criteria per Dr. Demaris Callander.    Attending physician is Dr. Mallie Darting.    Notified Ryta Kathlen Brunswick, RN and Marinda Elk, RN of acceptance.  Nurses call report to [phone].    Patient can arrive 03/16/2021 after 4:00PM.  Assunta Curtis, MSW, LCSW 03/16/2021 1:48 PM

## 2021-03-16 NOTE — ED Notes (Signed)
Caled to give nurse to nurse report to Rise Paganini, RN @[facility] . Nurse doing another admission and to call writer back once completed for report. Pt made aware of transport to [facility] for continuum of care.

## 2021-03-17 DIAGNOSIS — F322 Major depressive disorder, single episode, severe without psychotic features: Secondary | ICD-10-CM

## 2021-03-17 DIAGNOSIS — F332 Major depressive disorder, recurrent severe without psychotic features: Principal | ICD-10-CM

## 2021-03-17 DIAGNOSIS — F1094 Alcohol use, unspecified with alcohol-induced mood disorder: Secondary | ICD-10-CM

## 2021-03-17 MED ORDER — ESCITALOPRAM OXALATE 5 MG PO TABS
5.0000 mg | ORAL_TABLET | Freq: Every day | ORAL | Status: DC
  Administered 2021-03-17 – 2021-03-18 (×2): 5 mg via ORAL
  Filled 2021-03-17 (×3): qty 1

## 2021-03-17 NOTE — Progress Notes (Signed)
NUTRITION ASSESSMENT RD working remotely.   Pt identified as at risk on the Malnutrition Screen Tool  INTERVENTION: - will order 1 tablet multivitamin with minerals/day.  [facility] staff to continue to encourage PO intakes of meals and snacks.    NUTRITION DIAGNOSIS: Unintentional weight loss related to sub-optimal intake as evidenced by pt report.   Goal: Pt to meet >/= 90% of their estimated nutrition needs.  Monitor:  PO intake  Assessment:  Patient was admitted d/t SI and substance abuse. He drinks at least a 12 pack of beers/day.   Weight yesterday was 134 lb and PTA the most recently documented weight was 01/20/21 at [facility] where he weighed 136 lb. This indicates 2 lb weight loss (1.5% body weight) in the past 2 months; not significant for time frame.    60 y.o. male  Height: Ht Readings from Last 1 Encounters:  03/16/21 5\' 9"  (1.753 m)    Weight: Wt Readings from Last 1 Encounters:  03/16/21 60.8 kg    Weight Hx: Wt Readings from Last 10 Encounters:  03/16/21 60.8 kg  05/04/18 65.8 kg  05/03/18 59 kg  04/16/18 59 kg  02/27/18 68 kg  02/07/18 66.7 kg  01/05/18 69.9 kg  12/27/17 66.6 kg  12/19/17 70 kg  12/05/17 70.3 kg    BMI:  Body mass index is 19.79 kg/m. Pt meets criteria for normal weight based on current BMI.  Estimated Nutritional Needs: Kcal: 25-30 kcal/kg Protein: > 1 gram protein/kg Fluid: 1 ml/kcal  Diet Order:  Diet Order            Diet regular Room service appropriate? Yes; Fluid consistency: Thin  Diet effective now                Pt is also offered choice of unit snacks mid-morning and mid-afternoon.  Pt is eating as desired.   Lab results and medications reviewed.       Jarome Matin, MS, RD, LDN, CNSC Inpatient Clinical Dietitian RD pager # available in [place]  After hours/weekend pager # available in [facility]

## 2021-03-17 NOTE — Plan of Care (Signed)
Nurse discussed coping skills, anxiety, depression with patient.  

## 2021-03-17 NOTE — [facility] Suicide Risk Assessment (Signed)
[facility] Admission Suicide Risk Assessment   Nursing information obtained from:  Patient Demographic factors:  Male,Low socioeconomic status,Unemployed Current Mental Status:  Suicidal ideation indicated by patient Loss Factors:  NA Historical Factors:  Prior suicide attempts,Impulsivity Risk Reduction Factors:  Positive social support  Total Time spent with patient: 30 minutes Principal Problem: MDD (major depressive disorder), recurrent episode, severe ([place]) Diagnosis:  Principal Problem:   MDD (major depressive disorder), recurrent episode, severe ([place]) Active Problems:   Alcohol dependence ([place])   Generalized anxiety disorder  Subjective Data: Medical record reviewed.  I discussed the patient's case in detail with members of the treatment team.  I met with and interviewed with the patient this afternoon on the unit. [name] [name] 60 year old male with anxiety, panic attacks, history of depression and alcohol use disorder who was admitted for worsening symptoms of depression and suicidal ideation in the context of increased alcohol use.  Patient states that he has a long history of anxiety disorder ever since he was a child with panic attacks.  He has a history of alcohol use disorder and has been in detox on 2 prior occasions.  He denies any prior inpatient psychiatric hospitalizations.  Patient reports that his mood worsened recently when he lost his job and his alcohol use increased.  Over the past several weeks he has started to experience suicidal thoughts.  Patient states he never formally planned out an attempt but did think of overdosing on pills so he could go to sleep.  He had been taking Lexapro daily but once he relapsed on alcohol he stopped taking all of his medications.  Patient estimates he drinks between 9-12 beers per day over the past 3 months.  He has had withdrawal symptoms in the past when he stopped drinking but states that after he stopped drinking 36 hours ago he had the worst  withdrawal symptoms of his life.  He felt very shaky, had nausea vomiting and had insomnia.  Patient reports that the shakiness is much better and the nausea and vomiting has resolved since he started treatment with lorazepam for alcohol withdrawal during the current admission.  He denies any current wish for death or suicidal ideation.  He denies any thoughts of harming others.  Patient denies AH, VH, PI.  His appetite is returning.  He slept adequately last night.  Patient denies any history of suicide attempts in the past.  He has a family history of suicide and a first cousin.  The patient denies any drug or tobacco use.  He has an allergy to oxycodone but denies other allergies.  He was previously taking Lipitor, Lexapro and Norvasc.  Patient is interested in getting connected with a primary care provider who can prescribe medications for his cholesterol and blood pressure and assist him with chronic numbness that he has in his feet.  The patient is also interested in going to some sort of residential substance abuse treatment program at discharge if possible.  Continued Clinical Symptoms:  Alcohol Use Disorder Identification Test Final Score (AUDIT): 40 The "Alcohol Use Disorders Identification Test", Guidelines for Use in Primary Care, Second Edition.  World Pharmacologist [facility]). Score between 0-7:  no or low risk or alcohol related problems. Score between 8-15:  moderate risk of alcohol related problems. Score between 16-19:  high risk of alcohol related problems. Score 20 or above:  warrants further diagnostic evaluation for alcohol dependence and treatment.   CLINICAL FACTORS:   Severe Anxiety and/or Agitation Panic Attacks Depression:  Comorbid alcohol abuse/dependence Insomnia Alcohol/Substance Abuse/Dependencies More than one psychiatric diagnosis Previous Psychiatric Diagnoses and Treatments   Musculoskeletal: Strength & Muscle Tone: within normal limits Gait & Station:  normal Patient leans: N/A  Psychiatric Specialty Exam:  Presentation  General Appearance: Disheveled  Eye Contact:Fair  Speech:Slurred  Speech Volume:Decreased  Handedness:Right   Mood and Affect  Mood:Anxious; Depressed; Hopeless; Worthless  Affect:Tearful; Depressed   Thought Process  Thought Processes:Coherent  Descriptions of Associations:Intact  Orientation:Full (Time, Place and Person)  Thought Content:Logical  History of Schizophrenia/Schizoaffective disorder:No  Duration of Psychotic Symptoms:No data recorded Hallucinations:Hallucinations: None  Ideas of Reference:None  Suicidal Thoughts:Suicidal Thoughts: Yes, Active SI Active Intent and/or Plan: With Intent  Homicidal Thoughts:Homicidal Thoughts: No   [place]ensorium  Memory:Immediate Fair; Recent Fair; Remote [place]   Executive Functions  Concentration:Good  Attention Span:Good  [place]  Language:Good   Psychomotor Activity  Psychomotor Activity:Psychomotor Activity: Restlessness; Tremor   Assets  Assets:Communication Skills; Desire for Improvement; Resilience   Sleep  Sleep:Sleep: Poor    Physical Exam: Physical Exam Vitals and nursing note reviewed.  HENT:     Head: Normocephalic and atraumatic.  Neurological:     General: No focal deficit present.     Mental Status: He is alert and oriented to person, place, and time.    ROS Blood pressure (!) 133/98, pulse 95, temperature 98.1 F (36.7 C), temperature source Oral, resp. rate 18, height _0  (1.753 m), weight 60.8 kg, SpO2 99 %. Body mass index is 19.79 kg/m.   COGNITIVE FEATURES THAT CONTRIBUTE TO RISK:  None    SUICIDE RISK:   Moderate:  Frequent suicidal ideation with limited intensity, and duration, some specificity in terms of plans, no associated intent, good self-control, limited dysphoria/symptomatology, some risk factors present, and  identifiable protective factors, including available and accessible social support.  PLAN OF CARE: Patient is a 60 year old male with history of chronic anxiety disorder, alcohol use disorder and depression who was admitted for suicidal ideation in the context of increased alcohol use and now is currently detoxing from alcohol.  Patient has been started on CIWA protocol with standing dose lorazepam of 1 mg 4 times a day which will be tapered off over the next few days.  The patient also has as needed lorazepam for CIWA scores greater than 10.  Has been prescribed thiamine and multivitamin as well as comfort meds for symptoms of alcohol withdrawal.  We have restarted the patient's Norvasc for hypertension and Lipitor for cholesterol.  His Lexapro 5 mg daily has been continued.  He is interested in possibly discussing an alternative medication for his anxiety and mood during this hospital admission.  Labs have been reviewed.  Patient will need to be connected with outpatient primary care provider, outpatient psychiatry and therapist and residential or intensive outpatient substance abuse treatment program to address his alcohol use after discharge.  I certify that inpatient services furnished can reasonably be expected to improve the patient's condition.   Arthor Captain, MD 03/17/2021, 5:02 PM

## 2021-03-17 NOTE — [facility] Counselor (Signed)
Adult Comprehensive Assessment  Patient ID: [name] [name], male   DOB: [birth year], 60 y.o.   MRN: [id number]   Information Source: Information source: Patient  Current Stressors:  Patient states their primary concerns and needs for treatment are:: "I am an alcoholic and I have these negative voices in my head" Patient states their goals for this hospitilization and ongoing recovery are:: "To detox and get into residential treatment" Educational / Learning stressors: Pt reports some college  Employment / Job issues: Pt reports being unemployed  Family Relationships: Pt reports that his family lives in [place], [place] / Lack of resources (include bankruptcy): Pt reports no income or Insurance underwriter. Housing / Lack of housing: Pt reports being homeless and at [facility] for 1 day  Physical health (include injuries & life threatening diseases): Pt reports no stressors  Social relationships: Pt reports being separated for 13 years but remains friends with his wife. Substance abuse: Pt reports drinking alcohol daily  Bereavement / Loss: Pt reports no stressors   Living/Environment/Situation:  Living Arrangements: Other (Comment) Living conditions (as described by patient or guardian): Pt reports being homeless and was at [facility] for [facility] day.  Who else lives in the home?: Nobody How long has patient lived in current situation?: A couple years What is atmosphere in current home: Chaotic, Dangerous, Temporary  Family History:  Marital status: Separated Separated, when?: "13 years or a little over What types of issues is patient dealing with in the relationship?: Pt reports wife continues to be a support  Additional relationship information: NA Are you sexually active?: No What is your sexual orientation?: Heterosexual Has your sexual activity been affected by drugs, alcohol, medication, or emotional stress?: No Does patient have children?: Yes How many children?: 2 How is  patient's relationship with their children?: Pt reports a good relationship with his 59yo and 60yo adult sons in [place] History:  By whom was/is the patient raised?: Both parents Additional childhood history information: My parents were married when I was a child. "I never wanted for anything. They were hardworking and were wonderful parents." Description of patient's relationship with caregiver when they were a child: I was very close to my parents as a child. We are a very close family. Patient's description of current relationship with people who raised him/her: Not good with either parent right now due to his alcohol usage. How were you disciplined when you got in trouble as a child/adolescent?: 'Never was much of a need for it' Does patient have siblings?: Yes Number of Siblings: 2 Description of patient's current relationship with siblings: 1 older and 1 younger sister, decent relationship, but at one time at total support.  Alcohol has affect it.  1 in [place] and 1 in [place]. Did patient suffer any verbal/emotional/physical/sexual abuse as a child?: No Did patient suffer from severe childhood neglect?: No Has patient ever been sexually abused/assaulted/raped as an adolescent or adult?: No Was the patient ever a victim of a crime or a disaster?: No Witnessed domestic violence?: No Has patient been effected by domestic violence as an adult?: No  Education:  Highest grade of school patient has completed: 12th grade and Pt reports some college Currently a Ship broker?: No Learning disability?: Yes What learning problems does patient have?: Spelling and writing  Employment/Work Situation:   Employment situation: Unemployed What is the longest time patient has a held a job?: 14 years 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  Did You Receive Any Psychiatric Treatment/Services While in the [place]?: No Are There Guns or  Other Weapons in [place]?: No  Financial Resources:   Financial resources: No income or medical insurance  Does patient have a representative payee or guardian?: No  Alcohol/Substance Abuse:   What has been your use of drugs/alcohol within the last 12 months?: Pt reports drinking alcohol daily, approximately 9 beers a day.  Alcohol/Substance Abuse Treatment Hx: Past Tx, Inpatient, Past detox, Past Tx, Outpatient If yes, describe treatment: [facility] once before for detox, once at [facility], [place], [place] years ago, [facility] [place], numerous places while living in [place] for detox, [place] 2021, and [facility] 2022 Has alcohol/substance abuse ever caused legal problems?: No  Social Support System:   Heritage manager System: Poor Describe Community Support System: Wife, mother, sons, older sibling  Type of faith/religion: Darrick Meigs  How does patient's faith help to cope with current illness?: Engineering geologist:   Leisure and Hobbies: Marine scientist, Biomedical scientist, and fishing   Strengths/Needs:   What is the patient's perception of their strengths?: Grilling, cooking, being humble, and getting along with other people  Patient states they can use these personal strengths during their treatment to contribute to their recovery: Pt did not specify  Patient states these barriers may affect/interfere with their treatment: Homelessness and no medical insurance  Patient states these barriers may affect their return to the community: None  Other important information patient would like considered in planning for their treatment: None   Discharge Plan:   Currently receiving community mental health services: No Patient states concerns and preferences for aftercare planning are: Pt reports wanting to go to residential treatment for substance use. Patient states they will know when they are safe and ready for discharge when: "When I get back on my medications  and these voices stop" Does patient have access to transportation?: No Does patient have financial barriers related to discharge medications?: Yes Patient description of barriers related to discharge medications: No income, no medical insurance Plan for no access to transportation at discharge: [place] for living situation after discharge: Shelter resources  Will patient be returning to same living situation after discharge?: No  Summary/Recommendations:   Summary and Recommendations (to be completed by the evaluator): [name] [name] is a 60 year old, AA, male who was admitted to the hospital due to suicidal thoughts, withdrawal symptoms, and worsening depression.  The Pt states that he was homeless and accepted to the [facility] for one day before experiencing withdrawal symptoms and being sent to the hospital.  The Pt states that his family supports remain in [place], [place] and he has no current supports in [place].  The Pt reports drinking alcohol daily and consuming 9 beers each day.  The Pt denies all other substance use.  The Pt reports multiple inpatient and outpatient treatments for substance use in [place] and [place].  The Pt has expressed interest in returning to [place], [place] in the future.  While in the hospital the Pt can benefit from crisis stabilization, medication evaluation, group therapy, psycho-education, case management, and discharge planning.  Upon discharge the Pt would like to either return to [facility] or attend a 30 day residential treatment program for his alcohol use.  Pt will also be provided with resources to help with shelters and food.   Darleen Crocker. 03/17/2021

## 2021-03-17 NOTE — Progress Notes (Signed)
D:  Patient's self inventory sheet, patient sleeps good, sleep medication helpful.  Fair appetite, low energy level, good concentration.  Rated depression 7, hopeless 5, anxiety 8.  Withdrawals, tremors, cramping.  SI, contracts for safety.  Physical pain, worst pain #8 in past 24 hours, feet problems.  Goal is stay sober.  Plans to take detox medicines.  "They are doing a good job." A:  Medications administered per MD orders.  Emotional support and encouragement given patient. R:   Patient contracts for safety.  Denied HI.  SI off/on.  Denied visual hallucinations.  Stated he does hear ringing, mumblings, cannot understand what is being said.  SW to give patient homeless shelters, etc to research.

## 2021-03-17 NOTE — H&P (Signed)
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 improve  Physician Treatment Plan for Secondary Diagnosis: Principal Problem:   MDD (major depressive disorder), recurrent episode, severe ([place]) Active Problems:   Major depressive disorder, severe ([place])  Long Term Goal(s): Improvement in symptoms so as ready for discharge  Short Term Goals: Ability to identify and develop effective coping behaviors will improve, Compliance with prescribed medications will improve and Ability to identify triggers associated with substance abuse/mental health issues will improve  I certify that inpatient services furnished can reasonably be expected to improve the patient's condition.    Lindell Spar, NP, PMHNP, FNP-BC 4/21/20222:11 PM

## 2021-03-17 NOTE — Progress Notes (Signed)
Pt stated he was passive SI-contracts for safety, pt stated his biggest problem is his anxiety.    03/17/21 0000  Psych Admission Type (Psych Patients Only)  Admission Status Voluntary  Psychosocial Assessment  Patient Complaints Anxiety  Eye Contact Fair  Facial Expression Sad  Affect Anxious  Speech Logical/coherent  Interaction Assertive  Motor Activity Slow  Appearance/Hygiene Disheveled  Behavior Characteristics Anxious  Mood Anxious  Aggressive Behavior  Effect No apparent injury  Thought Process  Coherency WDL  Content WDL  Delusions WDL  Perception WDL  Judgment Poor  Confusion WDL  Danger to Self  Current suicidal ideation? Passive  Self-Injurious Behavior Some self-injurious ideation observed or expressed.  No lethal plan expressed   Agreement Not to Harm Self Yes  Description of Agreement verbal contract for safety  Danger to Others  Danger to Others None reported or observed

## 2021-03-17 NOTE — BHH Counselor (Addendum)
CSW contacted [facility] [phone], ext [phone].  CSW was informed that the Pt can return to the facility at discharge.  CSW was informed to call the facility back on Monday 03/21/21 to discuss when the Pt will be discharged and when they can arrange a time to pick the Pt up from the hospital.

## 2021-03-17 NOTE — Progress Notes (Signed)
Pt stated he was doing much better. Pt given PRN Trazodone per MAR with HS medication    03/17/21 2100  Psych Admission Type (Psych Patients Only)  Admission Status Voluntary  Psychosocial Assessment  Patient Complaints Anxiety;Depression  Eye Contact Fair  Facial Expression Sad  Affect Anxious  Speech Logical/coherent  Interaction Assertive  Motor Activity Slow  Appearance/Hygiene Disheveled  Behavior Characteristics Anxious  Mood Anxious  Aggressive Behavior  Effect No apparent injury  Thought Process  Coherency WDL  Content WDL  Delusions WDL  Perception WDL  Judgment Poor  Confusion WDL  Danger to Self  Current suicidal ideation? Passive  Self-Injurious Behavior Some self-injurious ideation observed or expressed.  No lethal plan expressed   Agreement Not to Harm Self Yes  Description of Agreement verbal contract for safety  Danger to Others  Danger to Others None reported or observed

## 2021-03-18 MED ORDER — ESCITALOPRAM OXALATE 10 MG PO TABS
10.0000 mg | ORAL_TABLET | Freq: Every day | ORAL | Status: DC
  Administered 2021-03-19 – 2021-03-22 (×4): 10 mg via ORAL
  Filled 2021-03-18: qty 18
  Filled 2021-03-18 (×5): qty 1

## 2021-03-18 NOTE — Progress Notes (Signed)
   03/18/21 0624  Vital Signs  Temp 97.6 F (36.4 C)  Temp Source Oral  Pulse Rate 89  Pulse Rate Source Monitor  BP 108/83  BP Location Right Arm  BP Method Automatic  Patient Position (if appropriate) Sitting  Oxygen Therapy  SpO2 100 %   D: Patient admits to some passive SI without a plan. Pt. Does verbally contract for safety. Pt. Denies HI. Pt. Rated anxiety 7/10 and depression 4/10. Pt. Com plained of "numbness" in his feet. A:  Patient took scheduled medicine. PT had 25 mgo f Vistaril for anxiety. Support and encouragement provided Routine safety checks conducted every 15 minutes. Patient  Informed to notify staff with any concerns.   R: Pt. Verbally contracted for safety. Safety maintained.

## 2021-03-18 NOTE — BHH Counselor (Signed)
CSW spoke with the receptionist at [facility] [phone] who states that the intake coordinator is out until Monday 03/21/21 and that they will not have any available beds until 04/23/21.  CSW was transferred to Mrs. [employer].  CSW left a voicemail for Mrs. June to return the call on Monday to confirm if there will be a bed available and if [facility] has been approved for treatment.

## 2021-03-18 NOTE — Progress Notes (Signed)
   03/18/21 0700  Sleep  Number of Hours 6.5

## 2021-03-18 NOTE — Progress Notes (Signed)
Recreation Therapy Notes  Date:  4.22.22 Time: 0930 Location: 300 Hall Dayroom  Group Topic: Stress Management  Goal Area(s) Addresses:  Patient will identify positive stress management techniques. Patient will identify benefits of using stress management post d/c.  Intervention: Stress Management  Activity :  Progressive Muscle Relaxation.  LRT read a script that guided patients in tensing each muscle group individually and then relaxing it to release any tension they may have in the body.  Education:  Stress Management, Discharge Planning.   Education Outcome: Acknowledges Education  Clinical Observations/Feedback: Pt did not attend group session.    Victorino Sparrow, LRT/CTRS         Victorino Sparrow A 03/18/2021 10:59 AM

## 2021-03-18 NOTE — BHH Suicide Risk Assessment (Signed)
[facility] INPATIENT:  Family/Significant Other Suicide Prevention Education  Suicide Prevention Education:  Education Completed; [name] [name] [phone] (Wife) has been identified by the patient as the family member/significant other with whom the patient will be residing, and identified as the person(s) who will aid the patient in the event of a mental health crisis (suicidal ideations/suicide attempt).  With written consent from the patient, the family member/significant other has been provided the following suicide prevention education, prior to the and/or following the discharge of the patient.  The suicide prevention education provided includes the following:  Suicide risk factors  Suicide prevention and interventions  National Suicide Hotline telephone number  [facility] assessment telephone number  [facility] Emergency Assistance [place] and/or Residential Mobile Crisis Unit telephone number  Request made of family/significant other to:  Remove weapons (e.g., guns, rifles, knives), all items previously/currently identified as safety concern.    Remove drugs/medications (over-the-counter, prescriptions, illicit drugs), all items previously/currently identified as a safety concern.  The family member/significant other verbalizes understanding of the suicide prevention education information provided.  The family member/significant other agrees to remove the items of safety concern listed above.  CSW spoke with Mrs. Bergfeld who states that her husband left the [facility] in [place], [place] and came to the [facility] in [facility] for substance use treatment.  Mrs. Vonstein states that her husband was doing well when he was taking his medications for anxiety.  Mrs Pullman states that her husband will begin binge drinking and will then stop taking his medications.  Mrs. Cantera states that her husband has done this for at least 12 years and has been to many different  treatment centers in both [place] and [place].  Mrs. Klingberg states that her husband does have his mother, father, and a sister in [place] but his father is very sick and both parents are in poor health.  Mrs. Beagley states that her husband has been to [facility] on 2 other occassions and does not like it there but does not have another place to go at this time.  Mrs. Breden states that her husband does not own any weapons or firearms.  CSW completed SPE with Mrs. Frett.   Frutoso Chase Kilea Mccarey 03/18/2021, 2:36 PM

## 2021-03-18 NOTE — Progress Notes (Signed)
[facility] MD Progress Note  03/18/2021 11:31 AM [name] [name]  MRN:  [id number]   Subjective:  [name] [name] reported " I am doing okay with medicine."  Evaluation: [name] [name] observed sitting on the exam in bed. [name] [name] appears slightly tremulous and pleasant.  [name] [name] was admitted due to alcohol dependency.  [name] [name] denies alcohol cravings.  Denies auditory or visual hallucinations. Denies auditory or visual hallucinations.   [place] reported that [name] [name] was able to follow-up with social worker on yesterday as [name] [name] is hopeful to get into [facility] recovery center.  However,reported [name] [name] is able to return back to the [facility].  Rates his depression 6 out of 10 with 10 being the worst.  Denies that [name] [name] has attended daily group sessions today as [name] [name] states. "  Very tired" denied that [name] [name] is sleeping well at night.  Reports a good appetite.  Support, encouragement and  reassurance was provided   Principal Problem: MDD (major depressive disorder), recurrent episode, severe ([place]) Diagnosis: Principal Problem:   MDD (major depressive disorder), recurrent episode, severe ([place]) Active Problems:   Alcohol dependence ([place])   Generalized anxiety disorder  Total Time spent with [name] [name]: 15 minutes  Past Psychiatric History:  Past Medical History:  Past Medical History:  Diagnosis Date  . Alcohol abuse    Last Drink on Sep 2018  . Anxiety    About 60 years of age  . Carpal tunnel syndrome of right wrist Dx 2015  . Depression    At 60 years of age  . GERD (gastroesophageal reflux disease) Dx 2007  . Hepatitis, alcoholic, acute 7591  . Hyperlipidemia    borderline, diet controlled  . Pancreatitis Dx 2014  . Pneumonia 11/2014  . 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 Reflux Dx 2007  . 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 carpel tunnel release Right 07/28/2014    done in [place]   . ELBOW SURGERY    . KNEE ARTHROSCOPY    . KNEE SURGERY Left    15 years ago   . LIPOMA EXCISION N/A 05/10/2016   Procedure: EXCISION OF SCALP LIPOMA;  Surgeon: Johnathan Hausen, MD;  Location: [facility];  Service: General;  Laterality: N/A;   Family History:  Family History  Problem Relation Age of Onset  . Diabetes Father   . Hypertension Father   . Hypertension Paternal Grandfather   . Alcohol abuse Paternal Grandfather   . Alcohol abuse Maternal Grandfather   . Alcohol abuse Maternal Grandmother   . Alcohol abuse Paternal Grandmother   . Colon cancer Neg Hx   . Rectal cancer Neg Hx   . Stomach cancer Neg Hx   . Bipolar disorder Neg Hx   . Depression Neg Hx    Family Psychiatric  History: Social History:  Social History   Substance and Sexual Activity  Alcohol Use Yes  . Alcohol/week: 0.0 standard drinks   Comment: last use today 05/03/18      Social History   Substance and Sexual Activity  Drug Use Not Currently  . Types: Marijuana   Comment: [name] [name] denies     Social History   Socioeconomic History  . Marital status: Legally Separated    Spouse name: Not on file  . Number of children: 2  . Years of education: 13 yrs  . Highest education level: Not on file  Occupational History  . Occupation: custodian    Comment: [employer]  Tobacco Use  . Smoking status: Never Smoker  . Smokeless tobacco: Never Used  Vaping Use  . Vaping Use: Never used  Substance and Sexual Activity  . Alcohol use: Yes    Alcohol/week: 0.0 standard drinks    Comment: last use today 05/03/18   . Drug use: Not Currently    Types: Marijuana    Comment: [name] [name] denies   . Sexual activity: Not on file  Other Topics Concern  . Not on file  Social History Narrative   Born and raised in [place], [place] by both parents. [name] [name] has 2 sisters and is the middle child. [name] [name] remains close with family. [name] [name] was married for 16 yrs and seperated in 2005. [name] [name] has 2 boys who are in their 20's now. [name] [name] lives alone in [place]. 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 acetaminophen (TYLENOL) tablet 650 mg  650 mg Oral Q6H PRN Money, Lowry Ram, FNP   650 mg at 03/17/21 0926  . alum & mag hydroxide-simeth (MAALOX/MYLANTA) 200-200-20 MG/5ML suspension 30 mL  30 mL Oral Q4H PRN Money, Darnelle Maffucci B, FNP      . amLODipine (NORVASC) tablet 5 mg  5 mg Oral Daily Money, Lowry Ram, FNP   5 mg at 03/18/21 [id number]  . atorvastatin (LIPITOR) tablet 20 mg  20 mg Oral Daily Money, Lowry Ram, FNP   20 mg at 03/18/21 [id number]  . escitalopram (LEXAPRO) tablet 5 mg  5 mg Oral Daily Nwoko, Agnes I, NP   5 mg at 03/18/21 [id number]  . hydrOXYzine (ATARAX/VISTARIL) tablet 25 mg  25 mg Oral Q6H PRN Money, Lowry Ram, FNP   25 mg at 03/18/21 0827  . loperamide (IMODIUM) capsule 2-4 mg  2-4 mg Oral PRN Money, Lowry Ram, FNP      . LORazepam (ATIVAN) tablet 1 mg  1 mg Oral Q6H PRN Money, Lowry Ram, FNP      . LORazepam (ATIVAN) tablet 1 mg  1 mg Oral TID Money, Lowry Ram, FNP   1 mg at 03/18/21 0827   Followed by  . [START ON 03/19/2021] LORazepam (ATIVAN) tablet 1 mg  1 mg Oral BID Money, Lowry Ram, FNP       Followed by  . [START ON 03/20/2021] LORazepam (ATIVAN) tablet 1 mg  1 mg Oral Daily Money, Travis B, FNP      . magnesium hydroxide (MILK OF MAGNESIA) suspension 30 mL  30 mL Oral Daily PRN Money, Lowry Ram, FNP      . multivitamin with minerals tablet 1 tablet  1 tablet Oral Daily Money, Lowry Ram, FNP   1 tablet at 03/18/21 [id number]  . ondansetron (ZOFRAN-ODT) disintegrating tablet 4 mg  4 mg Oral Q6H PRN Money, Lowry Ram, FNP      . thiamine (B-1) injection 100 mg   100 mg Intramuscular Once Money, Darnelle Maffucci B, FNP      . thiamine tablet 100 mg  100 mg Oral  Daily Money, Lowry Ram, FNP   100 mg at 03/18/21 3220  . traZODone (DESYREL) tablet 50 mg  50 mg Oral QHS PRN Money, Lowry Ram, FNP   50 mg at 03/17/21 2113    Lab Results:  Results for orders placed or performed during the hospital encounter of 03/16/21 (from the past 48 hour(s))  Urinalysis, Routine w reflex microscopic Urine, Clean Catch     Status: Abnormal   Collection Time: 03/16/21 11:39 AM  Result Value Ref Range   Color, Urine STRAW (A) YELLOW   APPearance CLEAR CLEAR   Specific Gravity, Urine 1.005 1.005 - 1.030   pH 8.0 5.0 - 8.0   Glucose, UA NEGATIVE NEGATIVE mg/dL   Hgb urine dipstick NEGATIVE NEGATIVE   Bilirubin Urine NEGATIVE NEGATIVE   Ketones, ur NEGATIVE NEGATIVE mg/dL   Protein, ur NEGATIVE NEGATIVE mg/dL   Nitrite NEGATIVE NEGATIVE   Leukocytes,Ua NEGATIVE NEGATIVE    Comment: Performed at [facility] [address] [place],  [address]  POCT Urine Drug Screen - ([facility])     Status: Abnormal   Collection Time: 03/16/21 11:39 AM  Result Value Ref Range   POC Amphetamine UR None Detected NONE DETECTED (Cut Off Level 1000 ng/mL)   POC Secobarbital (BAR) None Detected NONE DETECTED (Cut Off Level 300 ng/mL)   POC Buprenorphine (BUP) None Detected NONE DETECTED (Cut Off Level 10 ng/mL)   POC Oxazepam (BZO) Positive (A) NONE DETECTED (Cut Off Level 300 ng/mL)   POC Cocaine UR None Detected NONE DETECTED (Cut Off Level 300 ng/mL)   POC Methamphetamine UR None Detected NONE DETECTED (Cut Off Level 1000 ng/mL)   POC Morphine None Detected NONE DETECTED (Cut Off Level 300 ng/mL)   POC Oxycodone UR None Detected NONE DETECTED (Cut Off Level 100 ng/mL)   POC Methadone UR None Detected NONE DETECTED (Cut Off Level 300 ng/mL)   POC Marijuana UR None Detected NONE DETECTED (Cut Off Level 50 ng/mL)    Blood Alcohol level:  Lab Results  Component Value Date   ETH <10  03/16/2021   ETH 367 (HH) 05/20/7627    Metabolic Disorder Labs: Lab Results  Component Value Date   HGBA1C 5.9 (H) 03/16/2021   MPG 122.63 03/16/2021   MPG 120 06/30/2017   No results found for: PROLACTIN Lab Results  Component Value Date   CHOL 357 (H) 03/16/2021   TRIG 57 03/16/2021   HDL >135 03/16/2021   CHOLHDL NOT CALCULATED 03/16/2021   VLDL 11 03/16/2021   LDLCALC NOT CALCULATED 03/16/2021   LDLCALC 122 (H) 06/30/2017    Physical Findings: AIMS:  , ,  ,  ,    CIWA:  CIWA-Ar Total: 0 COWS:     Musculoskeletal: Strength & Muscle Tone: within normal limits Gait & Station: normal [name] [name] leans: N/A  Psychiatric Specialty Exam:  Presentation  General Appearance: Appropriate for Environment  Eye Contact:Minimal  Speech:Clear and Coherent  Speech Volume:Normal  Handedness:Right   Mood and Affect  Mood:Anxious; Depressed  Affect:Appropriate; Depressed   Thought Process  Thought Processes:Coherent  Descriptions of Associations:Intact  Orientation:Full (Time, Place and Person)  Thought Content:Logical  History of Schizophrenia/Schizoaffective disorder:No  Duration of Psychotic Symptoms:No data recorded Hallucinations:Hallucinations: None  Ideas of Reference:None  Suicidal Thoughts:Suicidal Thoughts: No  Homicidal Thoughts:No data recorded  Sensorium  Memory:Recent Fair; Immediate Fair; Remote [place]  Insight:Fair   Executive Functions  Concentration:Fair  Attention Span:Fair  [place]  Psychomotor Activity  Psychomotor Activity:Psychomotor Activity: Normal   Assets  Assets:Communication Skills; Intimacy; Financial Resources/Insurance   Sleep  Sleep:Sleep: Fair    Physical Exam: Physical Exam Neurological:     Mental Status: [name] [name] is alert.  Psychiatric:        Attention and Perception: Attention normal.        Mood and Affect: Mood normal.        Speech:  Speech normal.        Behavior: Behavior normal.        Thought Content: Thought content normal.        Cognition and Memory: Cognition normal.    Review of Systems  Neurological: Positive for headaches.  Psychiatric/Behavioral: Positive for depression. Negative for suicidal ideas. The [name] [name] is nervous/anxious.   All other systems reviewed and are negative.  Blood pressure 105/80, pulse 97, temperature 97.6 F (36.4 C), temperature source Oral, resp. rate 16, height 5\' 9"  (1.753 m), weight 60.8 kg, SpO2 100 %. Body mass index is 19.79 kg/m.   Treatment Plan Summary: Daily contact with [name] [name] to assess and evaluate symptoms and progress in treatment and Medication management   Continue with current treatment plan on 03/19/2019 except for noted  Increase Lexapro 5 mg to 10 mg p.o. daily Continue Ativan detox protocol-CIWA was Continue trazodone 50 mg p.o. as needed  CSW to continue working on discharge disposition [name] [name] encouraged to participate within the therapeutic milieu Consider follow-up with intensive outpatient chemical dependency program  [place], NP 03/18/2021, 11:31 AM

## 2021-03-18 NOTE — BHH Counselor (Signed)
CSW provided the PT with a packet for resources for homelessness that contains shelter and housing listings, free/reduced cost food resources, [employer], an Land for [employer], bus tickets, and information of Disability and suicide prevention.  CSW will follow up with the patient to see if these resources were helpful.

## 2021-03-18 NOTE — BHH Group Notes (Signed)
Type of Therapy and Topic: Group Therapy: Anger Management   Participation:  Active  Description of Group: In this group, patients will learn helpful strategies and techniques to manage anger, express anger in alternative ways, change hostile attitudes, and prevent aggressive acts, such as verbal abuse and violence.This group will be process-oriented and eductional, with patients participating in exploration of their own experiences as well as giving and receiving support and challenge from other group members.  Therapeutic Goals: 1. Patient will learn to manage anger. 2. Patient will learn to stop violence or the threat of violence. 3. Patient will learn to develop self control over thoughts and actions. 4. Patient will receive support and feedback from others  [name] [name] participated in group and accepted the worksheets that were provided.  [name] [name] states that one thing he can control and manage is what choices he makes each day.

## 2021-03-18 NOTE — Progress Notes (Addendum)
Pt visible on the unit this evening, pt stated he was feeling a little better , slowly but surely. Pt got PRN Trazodone and Vistaril per [facility]    03/18/21 2000  Psych Admission Type (Psych Patients Only)  Admission Status Voluntary  Psychosocial Assessment  Patient Complaints Anxiety  Eye Contact Fair  Facial Expression Sad  Affect Anxious  Speech Logical/coherent  Interaction Assertive  Motor Activity Slow  Appearance/Hygiene Disheveled  Behavior Characteristics Anxious  Mood Pleasant;Anxious  Aggressive Behavior  Effect No apparent injury  Thought Process  Coherency WDL  Content WDL  Delusions WDL  Perception WDL  Judgment Poor  Confusion WDL  Danger to Self  Current suicidal ideation? Passive  Self-Injurious Behavior Some self-injurious ideation observed or expressed.  No lethal plan expressed   Agreement Not to Harm Self Yes  Description of Agreement verbal contract for safety  Danger to Others  Danger to Others None reported or observed

## 2021-03-18 NOTE — Tx Team (Signed)
Interdisciplinary Treatment and Diagnostic Plan Update  03/18/2021 Time of Session:  [name] [name] MRN: [id number]  Principal Diagnosis: MDD (major depressive disorder), recurrent episode, severe ([place])  Secondary Diagnoses: Principal Problem:   MDD (major depressive disorder), recurrent episode, severe ([place]) Active Problems:   Alcohol dependence ([place])   Generalized anxiety disorder   Current Medications:  Current Facility-Administered Medications  Medication Dose Route Frequency Provider Last Rate Last Admin  . acetaminophen (TYLENOL) tablet 650 mg  650 mg Oral Q6H PRN Money, Lowry Ram, FNP   650 mg at 03/17/21 0926  . alum & mag hydroxide-simeth (MAALOX/MYLANTA) 200-200-20 MG/5ML suspension 30 mL  30 mL Oral Q4H PRN Money, Darnelle Maffucci B, FNP      . amLODipine (NORVASC) tablet 5 mg  5 mg Oral Daily Money, Lowry Ram, FNP   5 mg at 03/18/21 4193  . atorvastatin (LIPITOR) tablet 20 mg  20 mg Oral Daily Money, Lowry Ram, FNP   20 mg at 03/18/21 7902  . [START ON 03/19/2021] escitalopram (LEXAPRO) tablet 10 mg  10 mg Oral Daily Derrill Center, NP      . hydrOXYzine (ATARAX/VISTARIL) tablet 25 mg  25 mg Oral Q6H PRN Money, Lowry Ram, FNP   25 mg at 03/18/21 0827  . loperamide (IMODIUM) capsule 2-4 mg  2-4 mg Oral PRN Money, Lowry Ram, FNP      . LORazepam (ATIVAN) tablet 1 mg  1 mg Oral Q6H PRN Money, Lowry Ram, FNP      . LORazepam (ATIVAN) tablet 1 mg  1 mg Oral TID Money, Lowry Ram, FNP   1 mg at 03/18/21 1255   Followed by  . [START ON 03/19/2021] LORazepam (ATIVAN) tablet 1 mg  1 mg Oral BID Money, Lowry Ram, FNP       Followed by  . [START ON 03/20/2021] LORazepam (ATIVAN) tablet 1 mg  1 mg Oral Daily Money, Travis B, FNP      . magnesium hydroxide (MILK OF MAGNESIA) suspension 30 mL  30 mL Oral Daily PRN Money, Lowry Ram, FNP      . multivitamin with minerals tablet 1 tablet  1 tablet Oral Daily Money, Lowry Ram, FNP   1 tablet at 03/18/21 4097  . ondansetron (ZOFRAN-ODT) disintegrating tablet 4 mg  4  mg Oral Q6H PRN Money, Lowry Ram, FNP      . thiamine (B-1) injection 100 mg  100 mg Intramuscular Once Money, Darnelle Maffucci B, FNP      . thiamine tablet 100 mg  100 mg Oral Daily Money, Lowry Ram, FNP   100 mg at 03/18/21 3532  . traZODone (DESYREL) tablet 50 mg  50 mg Oral QHS PRN Money, Lowry Ram, FNP   50 mg at 03/17/21 2113   PTA Medications: Medications Prior to Admission  Medication Sig Dispense Refill Last Dose  . amLODipine (NORVASC) 5 MG tablet Take 5 mg by mouth daily.     Marland Kitchen atorvastatin (LIPITOR) 20 MG tablet Take 20 mg by mouth daily.       Patient Stressors: Financial difficulties Medication change or noncompliance Substance abuse  Patient Strengths: Ability for insight Active sense of humor Supportive family/friends  Treatment Modalities: Medication Management, Group therapy, Case management,  1 to 1 session with clinician, Psychoeducation, Recreational therapy.   Physician Treatment Plan for Primary Diagnosis: MDD (major depressive disorder), recurrent episode, severe ([place]) Long Term Goal(s): Improvement in symptoms so as ready for discharge Improvement in symptoms so as ready for discharge   Short Term  Goals: Ability to identify changes in lifestyle to reduce recurrence of condition will improve Ability to verbalize feelings will improve Ability to disclose and discuss suicidal ideas Ability to demonstrate self-control will improve Ability to identify and develop effective coping behaviors will improve Compliance with prescribed medications will improve Ability to identify triggers associated with substance abuse/mental health issues will improve  Medication Management: Evaluate patient's response, side effects, and tolerance of medication regimen.  Therapeutic Interventions: 1 to 1 sessions, Unit Group sessions and Medication administration.  Evaluation of Outcomes: Not Met  Physician Treatment Plan for Secondary Diagnosis: Principal Problem:   MDD (major  depressive disorder), recurrent episode, severe ([place]) Active Problems:   Alcohol dependence ([place])   Generalized anxiety disorder  Long Term Goal(s): Improvement in symptoms so as ready for discharge Improvement in symptoms so as ready for discharge   Short Term Goals: Ability to identify changes in lifestyle to reduce recurrence of condition will improve Ability to verbalize feelings will improve Ability to disclose and discuss suicidal ideas Ability to demonstrate self-control will improve Ability to identify and develop effective coping behaviors will improve Compliance with prescribed medications will improve Ability to identify triggers associated with substance abuse/mental health issues will improve     Medication Management: Evaluate patient's response, side effects, and tolerance of medication regimen.  Therapeutic Interventions: 1 to 1 sessions, Unit Group sessions and Medication administration.  Evaluation of Outcomes: Not Met   RN Treatment Plan for Primary Diagnosis: MDD (major depressive disorder), recurrent episode, severe ([place]) Long Term Goal(s): Knowledge of disease and therapeutic regimen to maintain health will improve  Short Term Goals: Ability to verbalize feelings will improve, Ability to disclose and discuss suicidal ideas and Ability to identify and develop effective coping behaviors will improve  Medication Management: RN will administer medications as ordered by provider, will assess and evaluate patient's response and provide education to patient for prescribed medication. RN will report any adverse and/or side effects to prescribing provider.  Therapeutic Interventions: 1 on 1 counseling sessions, Psychoeducation, Medication administration, Evaluate responses to treatment, Monitor vital signs and CBGs as ordered, Perform/monitor CIWA, COWS, AIMS and Fall Risk screenings as ordered, Perform wound care treatments as ordered.  Evaluation of Outcomes: Not  Met   LCSW Treatment Plan for Primary Diagnosis: MDD (major depressive disorder), recurrent episode, severe ([place]) Long Term Goal(s): Safe transition to appropriate next level of care at discharge, Engage patient in therapeutic group addressing interpersonal concerns.  Short Term Goals: Engage patient in aftercare planning with referrals and resources, Increase social support and Increase ability to appropriately verbalize feelings  Therapeutic Interventions: Assess for all discharge needs, 1 to 1 time with Social worker, Explore available resources and support systems, Assess for adequacy in community support network, Educate family and significant other(s) on suicide prevention, Complete Psychosocial Assessment, Interpersonal group therapy.  Evaluation of Outcomes: Not Met   Progress in Treatment: Attending groups: Yes. Participating in groups: Yes. Taking medication as prescribed: Yes. Toleration medication: Yes. Family/Significant other contact made: Yes, individual(s) contacted:  wife Patient understands diagnosis: Yes. Discussing patient identified problems/goals with staff: Yes. Medical problems stabilized or resolved: Yes. Denies suicidal/homicidal ideation: Yes. Issues/concerns per patient self-inventory: No. Other: None  New problem(s) identified: No, Describe:  None  New Short Term/Long Term Goal(s):medication stabilization, elimination of SI thoughts, development of comprehensive mental wellness plan.  Patient Goals:  Unable to attend  Discharge Plan or Barriers: Patient recently admitted. CSW will continue to follow and assess for appropriate referrals and  possible discharge planning.  Reason for Continuation of Hospitalization: Medication stabilization Suicidal ideation Withdrawal symptoms  Estimated Length of Stay: 3-5 days  Attendees: Patient: 03/18/2021   Physician:  03/18/2021   Nursing:  03/18/2021   RN Care Manager: 03/18/2021   Social Worker: Toney Reil, Latanya Presser 03/18/2021   Recreational Therapist:  03/18/2021   Other:  03/18/2021   Other:  03/18/2021   Other: 03/18/2021      Scribe for Treatment Team: Mliss Fritz, Latanya Presser 03/18/2021 2:32 PM

## 2021-03-19 MED ORDER — GABAPENTIN 100 MG PO CAPS
100.0000 mg | ORAL_CAPSULE | Freq: Two times a day (BID) | ORAL | Status: DC
  Administered 2021-03-19 – 2021-03-22 (×6): 100 mg via ORAL
  Filled 2021-03-19 (×2): qty 1
  Filled 2021-03-19: qty 36
  Filled 2021-03-19: qty 1
  Filled 2021-03-19: qty 36
  Filled 2021-03-19 (×6): qty 1

## 2021-03-19 NOTE — Progress Notes (Signed)
   03/19/21 2205  Psych Admission Type (Psych Patients Only)  Admission Status Voluntary  Psychosocial Assessment  Patient Complaints None  Eye Contact Fair  Facial Expression Flat;Sad  Affect Appropriate to circumstance  Speech Slow  Interaction Assertive  Motor Activity Slow  Appearance/Hygiene Improved  Behavior Characteristics Cooperative;Appropriate to situation  Mood Pleasant;Sad  Thought Process  Coherency WDL  Content WDL  Delusions None reported or observed  Perception WDL  Hallucination None reported or observed  Judgment Poor  Confusion WDL  Danger to Self  Current suicidal ideation? Denies  Self-Injurious Behavior No self-injurious ideation or behavior indicators observed or expressed   Agreement Not to Harm Self Yes  Description of Agreement verbal contract  Danger to Others  Danger to Others None reported or observed

## 2021-03-19 NOTE — Progress Notes (Signed)
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 He complained this morning dizzy, got detox medication and drank water and started feeling better. He has been feeling edgy, but feels foggy head. He has slept good with medication, and appetite is getting better and no nausea and vomiting. Patient was admitted due to alcohol dependency.  He denies alcohol cravings.  Denies auditory or visual hallucinations.   [name] [name] reported that he was able to follow-up with social worker on yesterday as he is hopeful to get into [facility] recovery center.  However,reported he is able to return back to the [place].  Rates his depression 6 out of 10, anxiety 7 out of 10,  with 10 being the worst. He denied current suicide or homicide ideation. He has attended daily group sessions today as he states. He is agreed to start Gabapentin for neuropathy. Support, encouragement and  reassurance was provided   Principal Problem: MDD (major depressive disorder), recurrent episode, severe ([place]) Diagnosis: Principal Problem:   MDD (major depressive disorder), recurrent episode, severe ([place]) Active Problems:   Alcohol dependence ([place])   Generalized anxiety disorder  Total Time spent with patient: 15 minutes  Past Psychiatric History: Alcohol dependence and depression. He had multiple treatments.   Past Medical History:  Past Medical History:  Diagnosis Date  . Alcohol abuse    Last Drink on Sep 2018  . Anxiety    About 60 years of age  . Carpal tunnel syndrome of right wrist Dx 2015  . Depression    At 60 years of age  . GERD (gastroesophageal reflux disease) Dx 2007  . Hepatitis, alcoholic, acute 2353  . Hyperlipidemia    borderline, diet controlled  . Pancreatitis Dx 2014  . Pneumonia 11/2014   . 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 Reflux Dx 2007  . 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 carpel tunnel release Right 07/28/2014    done in [place]   . ELBOW SURGERY    . KNEE ARTHROSCOPY    . KNEE SURGERY Left    15 years ago  . LIPOMA EXCISION N/A 05/10/2016   Procedure: EXCISION OF SCALP LIPOMA;  Surgeon: Johnathan Hausen, MD;  Location: [facility];  Service: General;  Laterality: N/A;   Family History:  Family History  Problem Relation Age of Onset  . Diabetes Father   . Hypertension Father   . Hypertension Paternal Grandfather   . Alcohol abuse Paternal Grandfather   . Alcohol abuse Maternal Grandfather   . Alcohol abuse Maternal Grandmother   . Alcohol abuse Paternal Grandmother   . Colon cancer Neg Hx   . Rectal cancer Neg Hx   . Stomach cancer Neg Hx   . Bipolar disorder Neg Hx   . Depression Neg Hx    Family Psychiatric  History: significant for drugs in cousin and killed himself over 38 yrs ago. Social History:  Social History   Substance and Sexual Activity  Alcohol Use Yes  . Alcohol/week: 0.0 standard drinks   Comment: last use today 05/03/18      Social History   Substance and Sexual Activity  Drug Use Not Currently  . Types: Marijuana   Comment: Patient denies  Social History   Socioeconomic History  . Marital status: Legally Separated    Spouse name: Not on file  . Number of children: 2  . Years of education: 13 yrs  . Highest education level: Not on file  Occupational History  . Occupation: custodian    Comment: [employer]  Tobacco Use  . Smoking status: Never Smoker  . Smokeless tobacco: Never Used  Vaping Use  . Vaping Use: Never used  Substance and Sexual Activity  . Alcohol use: Yes    Alcohol/week: 0.0 standard drinks    Comment: last use today 05/03/18   . Drug use: Not Currently    Types: Marijuana    Comment: Patient denies    . Sexual activity: Not on file  Other Topics Concern  . Not on file  Social History Narrative   Born and raised in [place], [place] by both parents. He has 2 sisters and is the middle child. He remains close with family. He was married for 16 yrs and seperated in 2005. He has 2 boys who are in their 20's now. He lives alone in [place]. 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 acetaminophen (TYLENOL) tablet 650 mg  650 mg Oral Q6H PRN Money, Lowry Ram, FNP   650 mg at 03/17/21 0926  . alum & mag hydroxide-simeth (MAALOX/MYLANTA) 200-200-20 MG/5ML suspension 30 mL  30 mL Oral Q4H PRN Money, Darnelle Maffucci B, FNP      . amLODipine (NORVASC) tablet 5 mg  5 mg Oral Daily Money, Lowry Ram, FNP   5 mg at 03/19/21 3235  . atorvastatin (LIPITOR) tablet 20 mg  20 mg Oral Daily Money, Lowry Ram, FNP   20 mg at 03/19/21 5732  . escitalopram (LEXAPRO) tablet 10 mg  10 mg Oral Daily Derrill Center, NP   10 mg at 03/19/21 2025  . hydrOXYzine (ATARAX/VISTARIL) tablet 25 mg  25 mg Oral Q6H PRN Money, Lowry Ram, FNP   25 mg at 03/18/21 2130  . loperamide (IMODIUM) capsule 2-4 mg  2-4 mg Oral PRN Money, Lowry Ram, FNP      . LORazepam (ATIVAN) tablet 1 mg  1 mg Oral Q6H PRN Money, Lowry Ram, FNP      . LORazepam (ATIVAN) tablet 1 mg  1 mg Oral BID Money, Lowry Ram, FNP   1 mg at 03/19/21 0825   Followed by  . [START ON 03/20/2021] LORazepam (ATIVAN) tablet 1 mg  1 mg Oral Daily Money, Travis B, FNP      . magnesium hydroxide (MILK OF MAGNESIA) suspension 30 mL  30 mL Oral Daily  PRN Money, Lowry Ram, FNP      . multivitamin with minerals tablet 1 tablet  1 tablet Oral Daily Money, Lowry Ram, FNP   1 tablet at 03/19/21 4270  . ondansetron (ZOFRAN-ODT) disintegrating tablet 4 mg  4 mg Oral Q6H PRN Money, Lowry Ram, FNP      .  thiamine (B-1) injection 100 mg  100 mg Intramuscular Once Money, Darnelle Maffucci B, FNP      . thiamine tablet 100 mg  100 mg Oral Daily Money, Lowry Ram, FNP   100 mg at 03/19/21 [id number]  . traZODone (DESYREL) tablet 50 mg  50 mg Oral QHS PRN Money, Lowry Ram, FNP   50 mg at 03/18/21 2130    Lab Results:  No results found for this or any previous visit (from the past 48 hour(s)).  Blood Alcohol level:  Lab Results  Component Value Date   ETH <10 03/16/2021   ETH 367 (HH) 0000000    Metabolic Disorder Labs: Lab Results  Component Value Date   HGBA1C 5.9 (H) 03/16/2021   MPG 122.63 03/16/2021   MPG 120 06/30/2017   No results found for: PROLACTIN Lab Results  Component Value Date   CHOL 357 (H) 03/16/2021   TRIG 57 03/16/2021   HDL >135 03/16/2021   CHOLHDL NOT CALCULATED 03/16/2021   VLDL 11 03/16/2021   LDLCALC NOT CALCULATED 03/16/2021   LDLCALC 122 (H) 06/30/2017    Physical Findings: AIMS:  , ,  ,  ,    CIWA:  CIWA-Ar Total: 2 COWS:     Musculoskeletal: Strength & Muscle Tone: within normal limits Gait & Station: normal Patient leans: N/A  Psychiatric Specialty Exam:  Presentation  General Appearance: Appropriate for Environment; Casual  Eye Contact:Fair  Speech:Clear and Coherent; Normal Rate  Speech Volume:Normal  Handedness:Right   Mood and Affect  Mood:Depressed  Affect:Appropriate; Congruent   Thought Process  Thought Processes:Coherent; Goal Directed  Descriptions of Associations:Intact  Orientation:Full (Time, Place and Person)  Thought Content:Logical  History of Schizophrenia/Schizoaffective disorder:No  Duration of Psychotic Symptoms:No data recorded Hallucinations:Hallucinations:  None  Ideas of Reference:None  Suicidal Thoughts:Suicidal Thoughts: No  Homicidal Thoughts:Homicidal Thoughts: No   Sensorium  Memory:Immediate Good; Remote Good  Judgment:Fair  Insight:Good   Executive Functions  Concentration:Good  Attention Span:Good  [place] of Knowledge:Good  Language:Good   Psychomotor Activity  Psychomotor Activity:Psychomotor Activity: Normal   Assets  Assets:Communication Skills; Leisure Time; Physical Health; Desire for Improvement; Resilience; Financial Resources/Insurance; Talents/Skills   Sleep  Sleep:Sleep: Good Number of Hours of Sleep: 8    Physical Exam: Physical Exam Neurological:     Mental Status: He is alert.  Psychiatric:        Attention and Perception: Attention normal.        Mood and Affect: Mood normal.        Speech: Speech normal.        Behavior: Behavior normal.        Thought Content: Thought content normal.        Cognition and Memory: Cognition normal.    Review of Systems  Neurological: Positive for headaches.  Psychiatric/Behavioral: Positive for depression. Negative for suicidal ideas. The patient is nervous/anxious.   All other systems reviewed and are negative.  Blood pressure 112/75, pulse 94, temperature 97.7 F (36.5 C), temperature source Oral, resp. rate 18, height 5\' 9"  (1.753 m), weight 60.8 kg, SpO2 100 %. Body mass index is 19.79 kg/m.   Treatment Plan Summary: Daily contact with patient to assess and evaluate symptoms and progress in treatment and Medication management   Continue with current treatment plan on 03/20/2019 except for noted  Continue Lexapro 10 mg p.o. daily Continue Ativan detox protocol-CIWA was Continue trazodone 50 mg p.o. as needed Start Gabapentin 100 mg BID for neuropathy.  CSW to continue working  on discharge disposition Patient encouraged to participate within the therapeutic milieu Consider follow-up with intensive outpatient chemical  dependency program  Ambrose Finland, MD 03/19/2021, 12:51 PM

## 2021-03-19 NOTE — Progress Notes (Signed)
   03/19/21 0938  Psych Admission Type (Psych Patients Only)  Admission Status Voluntary  Psychosocial Assessment  Patient Complaints Anxiety  Eye Contact Fair  Facial Expression Sad  Affect Anxious  Speech Slow  Interaction Assertive  Motor Activity Slow  Appearance/Hygiene Disheveled  Behavior Characteristics Anxious  Mood Pleasant;Anxious;Sad  Thought Process  Coherency WDL  Content WDL  Delusions WDL  Perception WDL  Hallucination None reported or observed  Judgment Poor  Confusion WDL  Danger to Self  Current suicidal ideation? Passive  Self-Injurious Behavior No self-injurious ideation or behavior indicators observed or expressed   Agreement Not to Harm Self Yes  Description of Agreement Verbal contract

## 2021-03-19 NOTE — Progress Notes (Signed)
D: Patient presents with high anxiety today. Patient complained of dizziness.  Patient denies SI/HI and AVH.  A:  Labs/Vitals monitored; Medication education provided; Patient supported emotionally; Patient asked to communicated his needs, concerns, and questions.  R:  Patient remains safe with 15 minute checks; Will continue POC.

## 2021-03-19 NOTE — [facility] Group Notes (Signed)
LCSW Group Therapy Note  03/19/2021     10:00-11:00AM  Type of Therapy and Topic:  Group Therapy:  Decisional Balance Exercise for Substance Use  Participation Level:  Active        . Description of Group:  The main focus of today's process group was learning how to use a decisional balance exercise to make a decision about whether to change an unhealthy coping skill, as well as how to use the information gathered in the actual process of planning that change.  Stages of Change were initially explained, then patients listed some of their most frequently utilized unhealthy coping techniques and CSW pointed out the similarities.  Motivational Interviewing and the whiteboard were utilized to help patients explore in-depth the perceived benefits and costs of a specific, shared unhealthy coping technique (drinking & drugging) as well as the benefits and costs of replacing that with other, healthy coping skills.    Therapeutic Goals Patient will be able to utilize the decision balance exercise on their own Patient will list coping skills they use to fulfill their needs Patient will identify the differences between healthy and  unhealthy coping skills Patient will verbalize the costs and benefits of drinking/drugging versus making the choice to change Patient will learn how to use the exercise to identify the most important supports to put in place so that they can succeed in a change to which they commit  Summary of Patient Progress: During group, patient expressed that he self-sabotages by self-medicating with alcohol.  He participated fully throughout the group and was respectful of others.  He appeared to have insight even at the beginning of group, but this also seemed to grow during the course of the exercise.   Therapeutic Modalities Cognitive Behavioral Therapy Motivational Interviewing   Maretta Los, LCSW

## 2021-03-19 NOTE — Progress Notes (Signed)
   03/19/21 0500  Sleep  Number of Hours 6.5

## 2021-03-20 LAB — GLUCOSE, CAPILLARY: Glucose-Capillary: 150 mg/dL — ABNORMAL HIGH (ref 70–99)

## 2021-03-20 NOTE — Progress Notes (Signed)
[facility] [place] MD Progress Note  03/20/2021 4:19 PM [name] [name]  MRN:  [id number]   Subjective:  [name] reported " I'm doing pretty good today. It's just that my anxiety acting up".  Evaluation:year AA male male with hx of alcoholism & anxiety/major depressive disorders. He is known in this [facility] from his previous hospitalization for mental health stabilization treatments/substance abuse issues. He is currently being admitted to the [facility] with complain of suicidal ideations & increased alcohol consumption that led to the development of suicidal ideations of 2 weeks duration. Apparently, Patient was relocating to [place], [place] from [place]  Daily notes: [name] [name] is seen, chart reviewed. The chart findings discussed with the treatment team. He is observed being calm, cooperative.  He has slept good with medication, and appetite is getting better and no nausea and vomiting. Patient was admitted due to alcohol dependency.  He denies alcohol cravings.  Denies auditory or visual hallucinations. [name] reported that he was able to follow-up with social worker on as he is hopeful to get into [facility] recovery center.  However, reported he is able to return back to the [place] if [place] residential plan falls through.  Rates his depression #3 &  anxiety #5 out of 10,  with 10 being the worst. He denied current suicide or homicide ideations. He has attended daily group sessions today as he states. He is started on Gabapentin for neuropathy. Support, encouragement and  reassurance was provided  Principal Problem: MDD (major depressive disorder), recurrent episode, severe ([place]) Diagnosis: Principal Problem:   MDD (major depressive disorder), recurrent episode, severe ([place]) Active Problems:   Alcohol dependence ([place])   Generalized anxiety disorder  Total Time spent with patient: 15 minutes  Past Psychiatric History: Alcohol dependence and depression. He had multiple treatments.   Past Medical History:  Past Medical History:   Diagnosis Date  . Alcohol abuse    Last Drink on Sep 2018  . Anxiety    About 60 years of age  . Carpal tunnel syndrome of right wrist Dx 2015  . Depression    At 60 years of age  . GERD (gastroesophageal reflux disease) Dx 2007  . Hepatitis, alcoholic, acute 8469  . Hyperlipidemia    borderline, diet controlled  . Pancreatitis Dx 2014  . Pneumonia 11/2014  . 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 Reflux Dx 2007  . 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 carpel tunnel release Right 07/28/2014    done in [place]   . ELBOW SURGERY    . KNEE ARTHROSCOPY    . KNEE SURGERY Left    15 years ago  . LIPOMA EXCISION N/A 05/10/2016   Procedure: EXCISION OF SCALP LIPOMA;  Surgeon: Johnathan Hausen, MD;  Location: [place];  Service: General;  Laterality: N/A;   Family History:  Family History  Problem Relation Age of Onset  . Diabetes Father   . Hypertension Father   . Hypertension Paternal Grandfather   . Alcohol abuse Paternal Grandfather   . Alcohol abuse Maternal Grandfather   . Alcohol abuse Maternal Grandmother   . Alcohol abuse Paternal Grandmother   . Colon cancer Neg Hx   . Rectal cancer Neg Hx   . Stomach cancer Neg Hx   . Bipolar disorder Neg Hx   . Depression Neg Hx    Family Psychiatric  History: significant for drugs in cousin and killed himself over 33 yrs ago.  Social History:  Social History   Substance and Sexual Activity  Alcohol Use Yes  . Alcohol/week: 0.0 standard drinks   Comment: last use today 05/03/18      Social History   Substance and Sexual Activity  Drug Use Not Currently  . Types: Marijuana   Comment: Patient denies     Social History   Socioeconomic History  . Marital status: Legally Separated    Spouse name: Not on file  . Number of children: 2  . Years of education: 13 yrs  . Highest education level: Not on file  Occupational History  .  Occupation: custodian    Comment: [employer]  Tobacco Use  . Smoking status: Never Smoker  . Smokeless tobacco: Never Used  Vaping Use  . Vaping Use: Never used  Substance and Sexual Activity  . Alcohol use: Yes    Alcohol/week: 0.0 standard drinks    Comment: last use today 05/03/18   . Drug use: Not Currently    Types: Marijuana    Comment: Patient denies   . Sexual activity: Not on file  Other Topics Concern  . Not on file  Social History Narrative   Born and raised in [place], [place] by both parents. He has 2 sisters and is the middle child. He remains close with family. He was married for 16 yrs and seperated in 2005. He has 2 boys who are in their 20's now. He lives alone in [place]. He is working as a Horticulturist, commercial: Not on [employer] Insecurity: Not on file  Transportation Needs: Not on file  Physical Activity: Not on file  Stress: Not on file  Social Connections: Not on file   Additional Social History:   Sleep: Good  Appetite:  Fair - getting better  Current Medications: Current Facility-Administered Medications  Medication Dose Route Frequency Provider Last Rate Last Admin  . acetaminophen (TYLENOL) tablet 650 mg  650 mg Oral Q6H PRN Money, Lowry Ram, FNP   650 mg at 03/17/21 0926  . alum & mag hydroxide-simeth (MAALOX/MYLANTA) 200-200-20 MG/5ML suspension 30 mL  30 mL Oral Q4H PRN Money, Darnelle Maffucci B, FNP      . amLODipine (NORVASC) tablet 5 mg  5 mg Oral Daily Money, Lowry Ram, FNP   5 mg at 03/20/21 0739  . atorvastatin (LIPITOR) tablet 20 mg  20 mg Oral Daily Money, Lowry Ram, FNP   20 mg at 03/20/21 0741  . escitalopram (LEXAPRO) tablet 10 mg  10 mg Oral Daily Derrill Center, NP   10 mg at 03/20/21 0741  . gabapentin (NEURONTIN) capsule 100 mg  100 mg Oral BID Ambrose Finland, MD   100 mg at 03/20/21 0741  . magnesium hydroxide (MILK OF MAGNESIA) suspension 30 mL  30 mL Oral Daily PRN Money, Lowry Ram,  FNP      . multivitamin with minerals tablet 1 tablet  1 tablet Oral Daily Money, Lowry Ram, FNP   1 tablet at 03/20/21 [id number]  . thiamine (B-1) injection 100 mg  100 mg Intramuscular Once Money, Darnelle Maffucci B, FNP      . thiamine tablet 100 mg  100 mg Oral Daily Money, Lowry Ram, FNP   100 mg at 03/20/21 [id number]  . traZODone (DESYREL) tablet 50 mg  50 mg Oral QHS PRN Money, Lowry Ram, FNP   50 mg at 03/19/21 2205   Lab Results:  No results found for this or  any previous visit (from the past 48 hour(s)).  Blood Alcohol level:  Lab Results  Component Value Date   ETH <10 03/16/2021   ETH 367 (HH) 40/98/1191   Metabolic Disorder Labs: Lab Results  Component Value Date   HGBA1C 5.9 (H) 03/16/2021   MPG 122.63 03/16/2021   MPG 120 06/30/2017   No results found for: PROLACTIN Lab Results  Component Value Date   CHOL 357 (H) 03/16/2021   TRIG 57 03/16/2021   HDL >135 03/16/2021   CHOLHDL NOT CALCULATED 03/16/2021   VLDL 11 03/16/2021   LDLCALC NOT CALCULATED 03/16/2021   LDLCALC 122 (H) 06/30/2017   Physical Findings: AIMS:  , ,  ,  ,    CIWA:  CIWA-Ar Total: 3 COWS:     Musculoskeletal: Strength & Muscle Tone: within normal limits Gait & Station: normal Patient leans: N/A  Psychiatric Specialty Exam:  Presentation  General Appearance: Appropriate for Environment; Casual  Eye Contact:Fair  Speech:Clear and Coherent; Normal Rate  Speech Volume:Normal  Handedness:Right  Mood and Affect  Mood: "improving"  Affect:Appropriate; Congruent  Thought Process  Thought Processes:Coherent; Goal Directed  Descriptions of Associations:Intact  Orientation:Full (Time, Place and Person)  Thought Content:Logical  History of Schizophrenia/Schizoaffective disorder:No  Duration of Psychotic Symptoms:No data recorded Hallucinations:Hallucinations: None  Ideas of Reference:None  Suicidal Thoughts:Suicidal Thoughts: No  Homicidal Thoughts:Homicidal Thoughts: No  Sensorium   Memory:Immediate Good; Remote Good  Judgment:Fair  Insight:Good  Executive Functions  Concentration:Good  Attention Span:Good  [place] of Knowledge:Good  Language:Good  Psychomotor Activity  Psychomotor Activity:Psychomotor Activity: Normal  Assets  Assets:Communication Skills; Leisure Time; Physical Health; Desire for Improvement; Resilience; Financial Resources/Insurance; Talents/Skills  Sleep  Sleep:Sleep: Good Number of Hours of Sleep: 8  Physical Exam: Physical Exam HENT:     Head: Normocephalic.     Nose: Nose normal.  Eyes:     Pupils: Pupils are equal, round, and reactive to light.  Cardiovascular:     Pulses: Normal pulses.  Pulmonary:     Effort: Pulmonary effort is normal.  Genitourinary:    Comments: Deferred Musculoskeletal:        General: Normal range of motion.     Cervical back: Normal range of motion.  Skin:    General: Skin is warm and dry.  Neurological:     General: No focal deficit present.     Mental Status: He is alert and oriented to person, place, and time. Mental status is at baseline.  Psychiatric:        Attention and Perception: Attention normal.        Mood and Affect: Mood normal.        Speech: Speech normal.        Behavior: Behavior normal.        Thought Content: Thought content normal.        Cognition and Memory: Cognition normal.    Review of Systems  Constitutional: Negative.   HENT: Negative.   Eyes: Negative.   Respiratory: Negative.   Cardiovascular: Negative.   Gastrointestinal: Negative.   Genitourinary: Negative.   Musculoskeletal: Negative.   Skin: Negative.   Neurological: Positive for headaches. Negative for dizziness, tingling, tremors, sensory change, speech change, focal weakness, seizures, loss of consciousness and weakness.  Endo/Heme/Allergies:       Allergies: Oxycodone  Psychiatric/Behavioral: Positive for depression and substance abuse (Hx. alcoholism). Negative for memory loss  and suicidal ideas. The patient is nervous/anxious. The patient does not have insomnia.   All  other systems reviewed and are negative.  Blood pressure 116/85, pulse 72, temperature 98 F (36.7 C), temperature source Oral, resp. rate 18, height 5\' 9"  (1.753 m), weight 60.8 kg, SpO2 100 %. Body mass index is 19.79 kg/m.  Treatment Plan Summary: Daily contact with patient to assess and evaluate symptoms and progress in treatment and Medication management   Continue inpatient hospitalization. Will continue today 03/20/2021 plan as below except where it is noted.  Continue Lexapro 10 mg p.o. daily depression Continue Ativan detox protocol-CIWA >10 Continue trazodone 50 mg p.o. as needed for sleep Continue Gabapentin 100 mg BID for neuropathy.  CSW to continue working on discharge disposition Patient encouraged to participate within the therapeutic milieu Consider follow-up with intensive outpatient chemical dependency program  Lindell Spar, NP, PMHNP, FNP-BC 03/20/2021, 4:19 PMPatient ID: [name] [name], male   DOB: 1961-06-03, 60 y.o.   MRN: [id number]

## 2021-03-20 NOTE — Progress Notes (Signed)
   03/20/21 2138  Psych Admission Type (Psych Patients Only)  Admission Status Voluntary  Psychosocial Assessment  Patient Complaints None  Eye Contact Fair  Facial Expression Sad  Affect Appropriate to circumstance  Speech Logical/coherent  Interaction Assertive  Motor Activity Slow  Appearance/Hygiene Improved  Behavior Characteristics Cooperative;Appropriate to situation  Mood Sad;Pleasant  Thought Process  Coherency WDL  Content WDL  Delusions None reported or observed  Perception WDL  Hallucination None reported or observed  Judgment Poor  Confusion WDL  Danger to Self  Current suicidal ideation? Denies  Self-Injurious Behavior No self-injurious ideation or behavior indicators observed or expressed   Agreement Not to Harm Self Yes  Description of Agreement verbal contract  Danger to Others  Danger to Others None reported or observed

## 2021-03-20 NOTE — Plan of Care (Addendum)
D:  Patient's self inventory sheet, patient sleeps good, sleep medicine helpful.   Fair appetite, low energy level, poor concentration.  Rated anxiety and depression 5, hopeless 2.  Withdrawals, tremors, cravings.  Denied SI.  Physical problems with feet.  Denied pain.  Goal is get healthy.  Plans to take medicines.  No discharge plans. A:  Medications administered per MD orders.  Emotional support and encouragement given patient. R:  Denied SI and HI, contracts for safety.  Denied A/V hallucinations.  Safety maintained with 15 minute checks.

## 2021-03-20 NOTE — [facility] Group Notes (Signed)
LCSW Group Therapy Note  03/20/2021 10:00am-11:00am  Type of Therapy and Topic:  Group Therapy - Relating to Music to Understand Ourselves  Participation Level:  Active   Description of Group This process group involved patients listening to a number of songs, then discussing how their emotions and/or lives relate to said songs.  This brought up patient descriptions of their anxiety, lack of confidence in themselves, acceptance of abuse in their lives, and use of substances to self-medicate.  In general, patients agreed that music can be used as a coping tool to express their feelings, have someone to relate to, and realize they are not alone.  Specifically, the songs played were: Dear Insecurity (about not wanting to continue giving anxiety permission to run one's life) Breaking Down (about making the choice not to continue using substances to deal with problems) You're Not The Only One (about everyone having problems) Warrior (about refusing to continue being other people's victim) I Am Enough (about choosing to look for the good in oneself, instead of only the bad) I Know Where I've Been (about celebrating the progress made so far)  Therapeutic Goals 1. Patient will listen to the songs and be given the opportunity to talk about how they reacted to each 2. Patient will empathize with each other over the pain shared 3. Patient will be given a message of hope 4. Patient will be exposed to the power of music as a coping tool   Summary of Patient Progress:  The patient stated at the beginning of group that he felt better than he did the first day in the hospital and was feeling hopeful.  He was insightful and contemplative with his comments throughout group.  He stated he had a "lot more hope" by the end of group.   Therapeutic Modalities Processing Activity   Maretta Los, LCSW

## 2021-03-20 NOTE — Plan of Care (Signed)
Nurse discussed anxiety, depression and coping skills with patient.  

## 2021-03-21 MED ORDER — HYDROXYZINE HCL 25 MG PO TABS
ORAL_TABLET | ORAL | Status: AC
  Filled 2021-03-21: qty 1

## 2021-03-21 MED ORDER — HYDROXYZINE HCL 25 MG PO TABS
25.0000 mg | ORAL_TABLET | Freq: Four times a day (QID) | ORAL | Status: DC | PRN
  Administered 2021-03-21 – 2021-03-22 (×4): 25 mg via ORAL
  Filled 2021-03-21 (×3): qty 1

## 2021-03-21 NOTE — Progress Notes (Signed)
Patient's self inventory sheet, patient has poor sleep, sleep medication not helpful.  Fair appetite, low energy level, poor concentration.  Rated depression and hopeless 9, anxiety 10+.  Denied withdrawals.  SI off/on, contracts for safety.  Denied physical problems, ft problems, numbness and low back pain.   Neurotin helps his pain.  Goal is discharge tomorrow, attend groups.  Learn coping skills, SW will write letter for [name] [name]  about [facility] status.

## 2021-03-21 NOTE — Progress Notes (Signed)
Recreation Therapy Notes  Date: 4.25.22 Time: 0930 Location: [address] Dayroom  Group Topic: Stress Management  Goal Area(s) Addresses:  Patient will identify positive stress management techniques. Patient will identify benefits of using stress management post d/c.  Intervention: Stress Management  Activity: Meditation.  LRT played a meditation that focused on making the most of your day and putting an emphasis on things they wish to accomplish throughout the day.    Education:  Stress Management, Discharge Planning.   Education Outcome: Acknowledges Education  Clinical Observations/Feedback:  Pt did not attend group session.    Victorino Sparrow, LRT/CTRS         Ria Comment, Bradford Cazier A 03/21/2021 11:59 AM

## 2021-03-21 NOTE — Plan of Care (Signed)
Nurse discussed coping skills with patient.  

## 2021-03-21 NOTE — Progress Notes (Signed)
[facility] MD Progress Note  03/21/2021 4:14 PM [name] [name]  MRN:  [id number]   Subjective: "I woke up feeling depressed and like it is not worth it.  I think I need another day."  Objective: Medical record reviewed.  Patient's case discussed with members of the treatment team and with nursing staff.  I saw and evaluated the patient this morning in the office on the unit.  Patient reports feeling anxious about discharge and states that he does not feel "mentally or physically right to be discharged today" and to return to his [facility] treatment program.  Patient states his belief that he will be ready for discharge and return to residential treatment tomorrow and requests to delay discharge by 1 day.  He reports that he had been doing better with improved mood for several days until he woke up this morning and learned of possible discharge.  The patient reports appetite is okay and sleep was fair last night.  Patient reports passive wishes not to be alive but denies any active suicidal ideation, intent or plan.  He denies side effects from his medication.  He denies symptoms of alcohol withdrawal.  The patient requests assistance in completing his safety plan due to difficulty with spelling and writing.  I discussed with patient that he will likely have some anxiety about discharge regardless of how many days it is delayed, and that delaying discharge much longer is only likely to increase rather than decrease his anxiety about discharge.  I encouraged patient to participate in groups today, work with staff to complete his safety plan and avoid daytime napping today and that we will plan for discharge tomorrow.  The patient stated understanding and agreement with this plan.    On interview, the patient maintains good eye contact.  He is cooperative and polite.  There are no observable motor abnormalities.  Speech is of normal rate amount and volume.    The patient appears anxious with congruent affect.   Thought processes are coherent and goal-directed but there is perseveration on desire to remain in hospital for another day.  No psychotic content is elicited.  The patient denies perceptual abnormalities and does not appear to respond or attend to internal stimuli.  He is alert and oriented.  Attention and concentration are grossly intact.  Insight and judgment are fair.    The patient slept 6.75 hours last night.  Vital signs thus far today include BP of 132/82 and heart rate of 93 at 7:48 AM and BP of 122/90 and heart rate of 78 at 12:43 PM.  The patient has not been attending groups thus far today but did attend groups yesterday and participated appropriately.  The patient has been taking standing dose medications as prescribed.  He took trazodone 50 mg PRN last night for sleep and required no other PRN medications.  The patient has not engaged in any self-injurious behavior or any aggressive behavior on the unit.  Principal Problem: MDD (major depressive disorder), recurrent episode, severe ([place]) Diagnosis: Principal Problem:   MDD (major depressive disorder), recurrent episode, severe ([place]) Active Problems:   Alcohol dependence ([place])   Generalized anxiety disorder  Total Time spent with patient: 25 minutes  Past Psychiatric History: See H&P  Past Medical History:  Past Medical History:  Diagnosis Date  . Alcohol abuse    Last Drink on Sep 2018  . Anxiety    About 60 years of age  . Carpal tunnel syndrome of right wrist Dx 2015  .  Depression    At 60 years of age  . GERD (gastroesophageal reflux disease) Dx 2007  . Hepatitis, alcoholic, acute 1308  . Hyperlipidemia    borderline, diet controlled  . Pancreatitis Dx 2014  . Pneumonia 11/2014  . 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 Reflux Dx 2007  . 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 carpel tunnel release Right 07/28/2014    done in [place]   . ELBOW  SURGERY    . KNEE ARTHROSCOPY    . KNEE SURGERY Left    15 years ago  . LIPOMA EXCISION N/A 05/10/2016   Procedure: EXCISION OF SCALP LIPOMA;  Surgeon: Johnathan Hausen, MD;  Location: [facility];  Service: General;  Laterality: N/A;   Family History:  Family History  Problem Relation Age of Onset  . Diabetes Father   . Hypertension Father   . Hypertension Paternal Grandfather   . Alcohol abuse Paternal Grandfather   . Alcohol abuse Maternal Grandfather   . Alcohol abuse Maternal Grandmother   . Alcohol abuse Paternal Grandmother   . Colon cancer Neg Hx   . Rectal cancer Neg Hx   . Stomach cancer Neg Hx   . Bipolar disorder Neg Hx   . Depression Neg Hx    Family Psychiatric  History: See H&P Social History:  Social History   Substance and Sexual Activity  Alcohol Use Yes  . Alcohol/week: 0.0 standard drinks   Comment: last use today 05/03/18      Social History   Substance and Sexual Activity  Drug Use Not Currently  . Types: Marijuana   Comment: Patient denies     Social History   Socioeconomic History  . Marital status: Legally Separated    Spouse name: Not on file  . Number of children: 2  . Years of education: 13 yrs  . Highest education level: Not on file  Occupational History  . Occupation: custodian    Comment: [employer]  Tobacco Use  . Smoking status: Never Smoker  . Smokeless tobacco: Never Used  Vaping Use  . Vaping Use: Never used  Substance and Sexual Activity  . Alcohol use: Yes    Alcohol/week: 0.0 standard drinks    Comment: last use today 05/03/18   . Drug use: Not Currently    Types: Marijuana    Comment: Patient denies   . Sexual activity: Not on file  Other Topics Concern  . Not on file  Social History Narrative   Born and raised in [place], [place] by both parents. He has 2 sisters and is the middle child. He remains close with family. He was married for 16 yrs and seperated in 2005. He has 2 boys who are in their  20's now. He lives alone in [place]. 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 acetaminophen (TYLENOL) tablet 650 mg  650  mg Oral Q6H PRN Money, Lowry Ram, FNP   650 mg at 03/17/21 4097  . alum & mag hydroxide-simeth (MAALOX/MYLANTA) 200-200-20 MG/5ML suspension 30 mL  30 mL Oral Q4H PRN Money, Darnelle Maffucci B, FNP      . amLODipine (NORVASC) tablet 5 mg  5 mg Oral Daily Money, Lowry Ram, FNP   5 mg at 03/21/21 0751  . atorvastatin (LIPITOR) tablet 20 mg  20 mg Oral Daily Money, Lowry Ram, FNP   20 mg at 03/21/21 0751  . escitalopram (LEXAPRO) tablet 10 mg  10 mg Oral Daily Derrill Center, NP   10 mg at 03/21/21 0751  . gabapentin (NEURONTIN) capsule 100 mg  100 mg Oral BID Ambrose Finland, MD   100 mg at 03/21/21 0751  . hydrOXYzine (ATARAX/VISTARIL) 25 MG tablet           . hydrOXYzine (ATARAX/VISTARIL) tablet 25 mg  25 mg Oral Q6H PRN Ethelene Hal, NP      . magnesium hydroxide (MILK OF MAGNESIA) suspension 30 mL  30 mL Oral Daily PRN Money, Lowry Ram, FNP      . multivitamin with minerals tablet 1 tablet  1 tablet Oral Daily Money, Lowry Ram, FNP   1 tablet at 03/21/21 0751  . thiamine (B-1) injection 100 mg  100 mg Intramuscular Once Money, Darnelle Maffucci B, FNP      . thiamine tablet 100 mg  100 mg Oral Daily Money, Lowry Ram, FNP   100 mg at 03/21/21 0751  . traZODone (DESYREL) tablet 50 mg  50 mg Oral QHS PRN Money, Lowry Ram, FNP   50 mg at 03/20/21 2138    Lab Results:  Results for orders placed or performed during the hospital encounter of 03/16/21  (from the past 48 hour(s))  Glucose, capillary     Status: Abnormal   Collection Time: 03/20/21  9:05 PM  Result Value Ref Range   Glucose-Capillary 150 (H) 70 - 99 mg/dL    Comment: Glucose reference range applies only to samples taken after fasting for at least 8 hours.    Blood Alcohol level:  Lab Results  Component Value Date   ETH <10 03/16/2021   ETH 367 (HH) 35/32/9924    Metabolic Disorder Labs: Lab Results  Component Value Date   HGBA1C 5.9 (H) 03/16/2021   MPG 122.63 03/16/2021   MPG 120 06/30/2017   No results found for: PROLACTIN Lab Results  Component Value Date   CHOL 357 (H) 03/16/2021   TRIG 57 03/16/2021   HDL >135 03/16/2021   CHOLHDL NOT CALCULATED 03/16/2021   VLDL 11 03/16/2021   LDLCALC NOT CALCULATED 03/16/2021   LDLCALC 122 (H) 06/30/2017    Physical Findings: AIMS:  , ,  ,  ,    CIWA:  CIWA-Ar Total: 1 COWS:     Musculoskeletal: Strength & Muscle Tone: within normal limits Gait & Station: normal Patient leans: N/A  Psychiatric Specialty Exam:  Presentation  General Appearance: Appropriate for Environment; Casual  Eye Contact:Good  Speech:Clear and Coherent; Normal Rate  Speech Volume:Normal  Handedness:Right   Mood and Affect  Mood:Anxious  Affect:Congruent   Thought Process  Thought Processes:Coherent; Goal Directed  Descriptions of Associations:Intact  Orientation:Full (Time, Place and Person)  Thought Content:Logical  History of Schizophrenia/Schizoaffective disorder:No  Duration of Psychotic Symptoms:No data recorded Hallucinations:Hallucinations: None  Ideas of Reference:None  Suicidal Thoughts:Suicidal Thoughts: Yes, Passive SI Passive Intent and/or Plan: Without Intent; Without Plan; Without Access to Means  Homicidal Thoughts:Homicidal  Thoughts: No   Sensorium  Memory:Immediate Good; Recent Good; Remote Good  Judgment:Fair  Insight:Fair   Executive Functions  Concentration:Good  Attention  Span:Good  [place]  Language:Good   Psychomotor Activity  Psychomotor Activity:Psychomotor Activity: Normal   Assets  Assets:Communication Skills; Desire for Improvement; Housing; Resilience   Sleep  Sleep:Sleep: Fair    Physical Exam: Physical Exam Vitals and nursing note reviewed.  HENT:     Head: Normocephalic and atraumatic.  Neurological:     General: No focal deficit present.     Mental Status: He is alert and oriented to person, place, and time.    Review of Systems  Constitutional: Negative for diaphoresis and fever.  HENT: Negative for hearing loss.   Eyes: Negative for blurred vision.  Respiratory: Negative for cough and shortness of breath.   Cardiovascular: Negative for chest pain and palpitations.  Gastrointestinal: Negative for nausea and vomiting.  Genitourinary: Negative for dysuria.  Musculoskeletal: Negative for myalgias.  Skin: Negative for rash.  Neurological: Negative for dizziness, tremors, seizures and headaches.  Psychiatric/Behavioral: Positive for depression and suicidal ideas. Negative for hallucinations. The patient is nervous/anxious.        Positive for passive wishes not to be alive   Blood pressure 122/90, pulse 78, temperature 98.1 F (36.7 C), temperature source Oral, resp. rate 18, height 5\' 9"  (1.753 m), weight 60.8 kg, SpO2 100 %. Body mass index is 19.79 kg/m.   Treatment Plan Summary: 60 year old male with alcohol use disorder, depression, and anxiety admitted for worsening symptoms of depression and suicidal ideation in the context of increased alcohol use.  Patient has completed detox from alcohol.  He is tolerating antidepressant treatment and is improving.  Anticipate probable discharge within 1 to 2 days to residential substance use disorder treatment program.  Daily contact with patient to assess and evaluate symptoms and progress in treatment and Medication management    Depression/anxiety -Continue Lexapro 10 mg daily -Continue hydroxyzine 25 mg every 6 hours PRN anxiety  Insomnia -Continue trazodone 50 mg nightly PRN insomnia  Alcohol use disorder/alcohol withdrawal -Patient is displaying no signs of alcohol withdrawal and is denying symptoms. -Discontinue CIWA protocol -Plan is for patient to return to [facility] for treatment of alcohol use disorder  Hypertension -Continue Norvasc 5 mg daily  Hyperlipidemia -Continue Lipitor 20 mg daily  Pain -Continue gabapentin 100 mg twice daily  Social work is working to arrange aftercare plans.  Anticipate probable discharge within the next 1 to 2 days to [place] where patient will receive residential treatment for substance use disorder.  I certify that inpatient services furnished can reasonably be expected to improve the patient's condition.  Arthor Captain, MD 03/21/2021, 4:14 PM

## 2021-03-21 NOTE — Progress Notes (Signed)
Psychoeducational Group Note  Date:  03/21/2021 Time:  2239  Group Topic/Focus:  Wrap-Up Group:   The focus of this group is to help patients review their daily goal of treatment and discuss progress on daily workbooks.  Participation Level: Did Not Attend  Participation Quality:  Not Applicable  Affect:  Not Applicable  Cognitive:  Not Applicable  Insight:  Not Applicable  Engagement in Group: Not Applicable  Additional Comments:  The patient did not attend group this evening.   Archie Balboa S 03/21/2021, 10:39 PM

## 2021-03-21 NOTE — B[facility] Group Notes (Signed)
Type of Therapy and Topic:  Group Therapy - Healthy vs Unhealthy Coping Skills  Participation Level:  Did Not Attend   Description of Group The focus of this group was to determine what unhealthy coping techniques typically are used by group members and what healthy coping techniques would be helpful in coping with various problems. Patients were guided in becoming aware of the differences between healthy and unhealthy coping techniques. Patients were asked to identify 2-3 healthy coping skills they would like to learn to use more effectively.  Therapeutic Goals 1. Patients learned that coping is what human beings do all day long to deal with various situations in their lives 2. Patients defined and discussed healthy vs unhealthy coping techniques 3. Patients identified their preferred coping techniques and identified whether these were healthy or unhealthy 4. Patients determined 2-3 healthy coping skills they would like to become more familiar with and use more often. 5. Patients provided support and ideas to each other   Summary of Patient Progress:  Pt did not attend the group   Therapeutic Modalities Cognitive Behavioral Therapy Motivational Interviewing

## 2021-03-21 NOTE — Progress Notes (Signed)
D:  Patient denied SI and HI, contracts for safety.  Denied A/V hallucinations.   A:  Medications administered per MD orders.  Emotional support and encouragement given patient. R:  Safety maintained with 15 minute checks.  

## 2021-03-21 NOTE — B[facility] Group Notes (Signed)
The focus of this group is to help patients establish daily goals to achieve during treatment and discuss how the patient can incorporate goal setting into their daily lives to aide in recovery.  Pt did not attend group 

## 2021-03-22 MED ORDER — HYDROXYZINE HCL 25 MG PO TABS: 25.0000 mg | ORAL_TABLET | Freq: Three times a day (TID) | ORAL | 0 refills | Status: DC | PRN

## 2021-03-22 MED ORDER — TRAZODONE HCL 50 MG PO TABS: 50.0000 mg | ORAL_TABLET | Freq: Every evening | ORAL | 0 refills | Status: DC | PRN

## 2021-03-22 MED ORDER — HYDROXYZINE HCL 25 MG PO TABS
25.0000 mg | ORAL_TABLET | Freq: Three times a day (TID) | ORAL | Status: DC | PRN
  Filled 2021-03-22: qty 20

## 2021-03-22 MED ORDER — ESCITALOPRAM OXALATE 10 MG PO TABS: 10.0000 mg | ORAL_TABLET | Freq: Every day | ORAL | 0 refills | Status: DC

## 2021-03-22 MED ORDER — AMLODIPINE BESYLATE 5 MG PO TABS: 5.0000 mg | ORAL_TABLET | Freq: Every day | ORAL | 0 refills | Status: DC

## 2021-03-22 MED ORDER — ATORVASTATIN CALCIUM 20 MG PO TABS: 20.0000 mg | ORAL_TABLET | Freq: Every day | ORAL | 0 refills | Status: DC

## 2021-03-22 MED ORDER — GABAPENTIN 100 MG PO CAPS: 100.0000 mg | ORAL_CAPSULE | Freq: Two times a day (BID) | ORAL | 0 refills | Status: DC

## 2021-03-22 NOTE — Progress Notes (Signed)
Adult Psychoeducational Group Note  Date:  03/22/2021 Time:  10:08 AM  Group Topic/Focus:  Healthy Communication:   The focus of this group is to discuss communication, barriers to communication, as well as healthy ways to communicate with others.  Participation Level:  Active  Participation Quality:  Appropriate  Affect:  Appropriate  Cognitive:  Appropriate  Insight: Appropriate and Good  Engagement in Group:  Engaged  Modes of Intervention:  Discussion  Additional Comments:  Pt attended the morning communication group.  [name] [name] 03/22/2021, 10:08 AM

## 2021-03-22 NOTE — Plan of Care (Signed)
  Problem: Education: Goal: Knowledge of disease or condition will improve Outcome: Progressing Goal: Understanding of discharge needs will improve Outcome: Progressing   Problem: Health Behavior/Discharge Planning: Goal: Ability to identify changes in lifestyle to reduce recurrence of condition will improve Outcome: Progressing Goal: Identification of resources available to assist in meeting health care needs will improve Outcome: Progressing   

## 2021-03-22 NOTE — Progress Notes (Signed)
Cooperative with treatment, pleasant on approach, medication compliant, no behavioral issues to report on shift at this time.

## 2021-03-22 NOTE — Discharge Summary (Signed)
Physician Discharge Summary Note  Patient:  [name] [name] is an 60 y.o., male MRN:  [id number] DOB:  1961-06-09 Patient phone:  [phone] (home)  Patient address:   [place] [address],  Total Time spent with patient: 30 minutes  Date of Admission:  03/16/2021 Date of Discharge: 03/22/2021  Reason for Admission:  (From MD's admission note): [name] [name] is a  54 year AA male with hx of alcoholism & anxiety/major depressive disorders. He is known in this [facility] from his previous hospitalization for mental health stabilization treatments/substance abuse issues. He is currently being admitted to the [facility] with complaint of suicidal ideations & increased alcohol consumption that led to the development of suicidal ideations of 2 weeks duration. Apparently, Patient was relocating to [place], [place] from [place]. He was suppose to move into the [facility] recovery house yesterday when he realized that the suicidal thoughts were worsening. He went to the [facility] for evaluation & was sent to [facility] for further treatment. During this evaluation, [name] [name] reports,  "I was in [place], arrived in [place] yesterday to move into the [facility]. I knew that I was intoxicated & needing detox treatment prior to moving into this recovery house. Then, I started hearing voices telling me that it might not be a good idea to continue to be alive any more because I relapsed on alcohol. I have been drinking heavily about 12 packs of beer daily in the last 2 months since I relapsed on alcohol. I also suffer from depression, anxiety & stress. Prior to my recent relapse, I was sober x 5 months. I take medicine for depression & anxiety. It is called Lexapro. I need help because I ain't gonna quit drinking on my own. It is not going to happen. I will need a long term substance abuse treatment program after discharge. I'm not suicidal right at the moment, no homicidal thoughts. Right now, I'm only having  some ringing in my ears".  Evaluation on the unit, day of discharge: Patient is seen and evaluated. He denies SI/HI/AVH, paranoia and delusions. He is taking his medications without any issues. He is sleeping and eating well. He is attending group therapy and working on coping skills. He is calm and cooperative. He is visible on the unit and interacts appropriately with staff ana peers. He has been living at [facility] recovery house. He will discharge back there today. His vital signs are stable, he is afebrile. He had no new labs today. He is stable for discharge today to continue his substance abuse recovery.   Principal Problem: MDD (major depressive disorder), recurrent episode, severe ([place]) Discharge Diagnoses: Principal Problem:   MDD (major depressive disorder), recurrent episode, severe ([place]) Active Problems:   Alcohol dependence ([place])   Generalized anxiety disorder   Past Psychiatric History: See H&P  Past Medical History:  Past Medical History:  Diagnosis Date  . Alcohol abuse    Last Drink on Sep 2018  . Anxiety    About 60 years of age  . Carpal tunnel syndrome of right wrist Dx 2015  . Depression    At 60 years of age  . GERD (gastroesophageal reflux disease) Dx 2007  . Hepatitis, alcoholic, acute 123456  . Hyperlipidemia    borderline, diet controlled  . Pancreatitis Dx 2014  . Pneumonia 11/2014  . 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 Reflux Dx 2007  . 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 carpel tunnel release Right 07/28/2014    done in [place]   . ELBOW SURGERY    . KNEE ARTHROSCOPY    . KNEE SURGERY Left    15 years ago  . LIPOMA EXCISION N/A 05/10/2016   Procedure: EXCISION OF SCALP LIPOMA;  Surgeon: Johnathan Hausen, MD;  Location: [facility];  Service: General;  Laterality: N/A;   Family History:  Family History  Problem Relation Age of Onset  . Diabetes Father    . Hypertension Father   . Hypertension Paternal Grandfather   . Alcohol abuse Paternal Grandfather   . Alcohol abuse Maternal Grandfather   . Alcohol abuse Maternal Grandmother   . Alcohol abuse Paternal Grandmother   . Colon cancer Neg Hx   . Rectal cancer Neg Hx   . Stomach cancer Neg Hx   . Bipolar disorder Neg Hx   . Depression Neg Hx    Family Psychiatric  History: See H&P Social History:  Social History   Substance and Sexual Activity  Alcohol Use Yes  . Alcohol/week: 0.0 standard drinks   Comment: last use today 05/03/18      Social History   Substance and Sexual Activity  Drug Use Not Currently  . Types: Marijuana   Comment: Patient denies     Social History   Socioeconomic History  . Marital status: Legally Separated    Spouse name: Not on file  . Number of children: 2  . Years of education: 13 yrs  . Highest education level: Not on file  Occupational History  . Occupation: custodian    Comment: [employer]  Tobacco Use  . Smoking status: Never Smoker  . Smokeless tobacco: Never Used  Vaping Use  . Vaping Use: Never used  Substance and Sexual Activity  . Alcohol use: Yes    Alcohol/week: 0.0 standard drinks    Comment: last use today 05/03/18   . Drug use: Not Currently    Types: Marijuana    Comment: Patient denies   . Sexual activity: Not on file  Other Topics Concern  . Not on file  Social History Narrative   Born and raised in [place], [place] by both parents. He has 2 sisters and is the middle child. He remains close with family. He was married for 16 yrs and seperated in 2005. He has 2 boys who are in their 20's now. He lives alone in [place]. He is working as a [name] [name], [name] [name]: Not on file  Transportation Needs: Not on file  Physical Activity: Not on file  Stress: Not on file  Social Connections: Not on file    Hospital Course:  [name] [name] is a 60 year  old male with a history of alcoholism & anxiety/major depressive disorders. He is known at [facility] from his previous hospitalizations for mental health stabilization treatments/substance abuse issues. He is currently being admitted to the [facility] with complaint of suicidal ideations & increased alcohol consumption that led to the development of suicidal ideations of 2 weeks duration.  After the above admission evaluation, [name] [name] presenting symptoms were noted. He was recommended for mood stabilization treatments. The medication regimen targeting those presenting symptoms were discussed with him & initiated with his consent. His Lexapro for depression was continued. He received Vistaril PRN for anxiety and Trazodone PRN for insomnia. His medical medications; Norvasc and Lipitor were continued during his  hospitalization. His UDS and BAL on arrival to the ED were negative. He admitted to daily drinking and was placed on a CIWA withdrawal protocol and any withdrawal symptoms were medicated with the appropriate PRN medications. He was however medicated, stabilized & discharged on the medications as listed on his discharge medication lists below. Besides the mood stabilization treatments, [name] [name] was also enrolled & participated in the group counseling sessions being offered & held on this unit. He learned coping skills. He presented no other significant pre-existing medical issues that required treatment. He tolerated his treatment regimen without any adverse effects or reactions reported.   During the course of his hospitalization, the 15-minute checks were adequate to ensure patient's safety. [name] [name] did not display any dangerous, violent or suicidal behavior on the unit.  He interacted with patients & staff appropriately, participated appropriately in the group sessions/therapies. His medications were addressed & adjusted to meet his needs. He was recommended for outpatient follow-up care & medication management upon discharge  to assure continuity of care & mood stability.  At the time of discharge patient is not reporting any acute suicidal/homicidal ideations. He feels more confident about his/her self-care & in managing his mental health. He currently denies any new issues or concerns. Education and supportive counseling provided throughout his hospital stay & upon discharge.   Today upon his discharge evaluation with the attending psychiatrist, [name] [name] shares he is doing well. He denies any other specific concerns. He is sleeping well. His appetite is good. He denies other physical complaints. He denies AH/VH, delusional thoughts or paranoia. He does not appear to be responding to any internal stimuli. He feels that his medications have been helpful & is in agreement to continue his current treatment regimen as recommended. He was able to engage in safety planning including plan to return to [facility] or contact emergency services if he feels unable to maintain his own safety or the safety of others. Pt had no further questions, comments, or concerns. He left [facility] with all personal belongings in no apparent distress. Transportation per [employer].   Physical Findings: AIMS: Facial and Oral Movements Muscles of Facial Expression: None, normal Lips and Perioral Area: None, normal Jaw: None, normal Tongue: None, normal,Extremity Movements Upper (arms, wrists, hands, fingers): None, normal Lower (legs, knees, ankles, toes): None, normal, Trunk Movements Neck, shoulders, hips: None, normal, Overall Severity Severity of abnormal movements (highest score from questions above): None, normal Incapacitation due to abnormal movements: None, normal Patient's awareness of abnormal movements (rate only patient's report): No Awareness, Dental Status Current problems with teeth and/or dentures?: No Does patient usually wear dentures?: No  CIWA:  CIWA-Ar Total: 0 COWS:     Musculoskeletal: Strength & Muscle Tone: within normal  limits Gait & Station: normal Patient leans: N/A  Psychiatric Specialty Exam:  Presentation  General Appearance: Appropriate for Environment; Casual; Fairly Groomed  Eye Contact:Good  Speech:Clear and Coherent; Normal Rate  Speech Volume:Normal  Handedness:Right  Mood and Affect  Mood:Euthymic  Affect:Congruent; Appropriate  Thought Process  Thought Processes:Coherent; Goal Directed; Linear  Descriptions of Associations:Intact  Orientation:Full (Time, Place and Person)  Thought Content:Logical  History of Schizophrenia/Schizoaffective disorder:No  Duration of Psychotic Symptoms:No data recorded Hallucinations:Hallucinations: None  Ideas of Reference:None  Suicidal Thoughts:Suicidal Thoughts: No  Homicidal Thoughts:Homicidal Thoughts: No  Sensorium  Memory:Immediate Good; Recent Good; Remote Good  Judgment:Fair  Insight:Fair  Executive Functions  Concentration:Good  Attention Span:Good  [name] [name]  Language:Good  Psychomotor Activity  Psychomotor Activity:Psychomotor  Activity: Normal  Assets  Assets:Communication Skills; Desire for Improvement; Housing; Resilience; Social Support  Sleep  Sleep:Sleep: Good  Physical Exam: Physical Exam Vitals and nursing note reviewed.  Constitutional:      Appearance: Normal appearance.  HENT:     Head: Normocephalic.  Pulmonary:     Effort: Pulmonary effort is normal.  Musculoskeletal:        General: Normal range of motion.     Cervical back: Normal range of motion.  Neurological:     Mental Status: He is alert and oriented to person, place, and time.  Psychiatric:        Attention and Perception: Attention and perception normal. He does not perceive auditory or visual hallucinations.        Mood and Affect: Mood normal.        Speech: Speech normal.        Behavior: Behavior normal. Behavior is cooperative.        Thought Content: Thought content normal. Thought content  is not paranoid or delusional. Thought content does not include homicidal or suicidal ideation. Thought content does not include homicidal or suicidal plan.        Cognition and Memory: Cognition normal.    Review of Systems  Constitutional: Negative for fever.  HENT: Negative for congestion and sore throat.   Respiratory: Negative for cough, shortness of breath and wheezing.   Cardiovascular: Negative for chest pain.  Gastrointestinal: Negative.   Genitourinary: Negative.   Musculoskeletal: Negative.   Neurological: Negative.    Blood pressure 101/70, pulse 81, temperature 97.9 F (36.6 C), temperature source Oral, resp. rate 16, height 5\' 9"  (1.753 m), weight 60.8 kg, SpO2 100 %. Body mass index is 19.79 kg/m.   Have you used any form of tobacco in the last 30 days? (Cigarettes, Smokeless Tobacco, Cigars, and/or Pipes): No  Has this patient used any form of tobacco in the last 30 days? (Cigarettes, Smokeless Tobacco, Cigars, and/or Pipes) Yes, N/A  Blood Alcohol level:  Lab Results  Component Value Date   ETH <10 03/16/2021   ETH 367 (HH) 37/16/9678    Metabolic Disorder Labs:  Lab Results  Component Value Date   HGBA1C 5.9 (H) 03/16/2021   MPG 122.63 03/16/2021   MPG 120 06/30/2017   No results found for: PROLACTIN Lab Results  Component Value Date   CHOL 357 (H) 03/16/2021   TRIG 57 03/16/2021   HDL >135 03/16/2021   CHOLHDL NOT CALCULATED 03/16/2021   VLDL 11 03/16/2021   LDLCALC NOT CALCULATED 03/16/2021   LDLCALC 122 (H) 06/30/2017    See Psychiatric Specialty Exam and Suicide Risk Assessment completed by Attending Physician prior to discharge.  Discharge destination:  Other:  [facility]  Is patient on multiple antipsychotic therapies at discharge:  No   Has Patient had three or more failed trials of antipsychotic monotherapy by history:  No  Recommended Plan for Multiple Antipsychotic Therapies: NA  Discharge Instructions    Diet - low sodium  heart healthy   Complete by: As directed    Increase activity slowly   Complete by: As directed      Allergies as of 03/22/2021      Reactions   Oxycodone Other (See Comments)   abd pain, stomach cramps       Medication List    TAKE these medications     Indication  amLODipine 5 MG tablet Commonly known as: NORVASC Take 1 tablet (5 mg total) by mouth daily.  Indication: High Blood Pressure Disorder   atorvastatin 20 MG tablet Commonly known as: LIPITOR Take 1 tablet (20 mg total) by mouth daily.  Indication: High Amount of Fats in the Blood   escitalopram 10 MG tablet Commonly known as: LEXAPRO Take 1 tablet (10 mg total) by mouth daily. Start taking on: March 23, 2021  Indication: Generalized Anxiety Disorder, Major Depressive Disorder   gabapentin 100 MG capsule Commonly known as: NEURONTIN Take 1 capsule (100 mg total) by mouth 2 (two) times daily.  Indication: Abuse or Misuse of Alcohol, Alcohol Withdrawal Syndrome   hydrOXYzine 25 MG tablet Commonly known as: ATARAX/VISTARIL Take 1 tablet (25 mg total) by mouth 3 (three) times daily as needed for anxiety.  Indication: Feeling Anxious   traZODone 50 MG tablet Commonly known as: DESYREL Take 1 tablet (50 mg total) by mouth at bedtime as needed for sleep.  Indication: Trouble Sleeping       Follow-up Information    [facility] Follow up.   Why: Please contact this provider daily to find out if there is a bed available.  Contact information: [address] [place] [address] [phone]        [facility]. Go on 03/29/2021.   Specialty: Behavioral Health Why: You have an appointment to start substance abuse intensive outpatient therapy (SAIOP) on 03/29/21 at 3:00 pm.  You also have an appointment for 04/15/21 at 8:00 am. These appointments will be held in person. Contact information: [place] [phone]               Follow-up recommendations:  Activity:  as tolerated Diet:  heart Healthy  Comments:  Prescriptions were given at discharge.  Patient is  agreeable to the discharge plan.  He was given opportunity to ask questions.  He appears to feel comfortable with discharge and denies any current suicidal or homicidal thoughts.   Patient is instructed prior to discharge to: Take all medications as prescribed by his mental healthcare provider. Report any adverse effects and or reactions from the medicines to his outpatient provider promptly. Patient has been instructed & cautioned: To not engage in alcohol and or illegal drug use while on prescription medicines. In the event of worsening symptoms, patient is instructed to call the crisis hotline, 911 and or go to the nearest ED for appropriate evaluation and treatment of symptoms. To follow-up with his primary care provider for your other medical issues, concerns and or health care needs.   Signed: Ethelene Hal, NP 03/22/2021, 11:48 AM

## 2021-03-22 NOTE — Progress Notes (Signed)
  [facility] Adult Case Management Discharge Plan :  Will you be returning to the same living situation after discharge:  Yes,  [place] At discharge, do you have transportation home?: Yes,  Safe Transport  Do you have the ability to pay for your medications: Yes,  Community Support  Release of information consent forms completed and in the chart;  Patient's signature needed at discharge.  Patient to Follow up at:  Follow-up Information    [facility] Follow up.   Why: Please contact this provider daily to find out if there is a bed available.  Contact information: [address] [place] [address] [phone]        [facility]. Go on 03/29/2021.   Specialty: Behavioral Health Why: You have an appointment to start substance abuse intensive outpatient therapy (SAIOP) on 03/29/21 at 3:00 pm.  You also have an appointment for 04/15/21 at 8:00 am. These appointments will be held in person. Contact information: [place] [phone]              Next level of care provider has access to [facility] [place] and Suicide Prevention discussed: Yes,  with patient and wife  Have you used any form of tobacco in the last 30 days? (Cigarettes, Smokeless Tobacco, Cigars, and/or Pipes): No  Has patient been referred to the Quitline?: N/A patient is not a smoker  Patient has been referred for addiction treatment: Yes  Darleen Crocker, [place] 03/22/2021, 9:50 AM

## 2021-03-22 NOTE — Plan of Care (Signed)
Discharge note  Patient verbalizes readiness for discharge. Follow up plan explained, AVS, Transition record and SRA given. Prescriptions and teaching provided. Belongings returned and signed for. Suicide safety plan completed and signed. Patient verbalizes understanding. Patient denies SI/HI and assures this Probation officer they will seek assistance should that change. Patient discharged to lobby where driver was waiting.  Problem: Education: Goal: Knowledge of disease or condition will improve 03/22/2021 1734 by Baron Sane, RN Outcome: Adequate for Discharge 03/22/2021 0854 by Baron Sane, RN Outcome: Progressing Goal: Understanding of discharge needs will improve 03/22/2021 1734 by Baron Sane, RN Outcome: Adequate for Discharge 03/22/2021 0854 by Baron Sane, RN Outcome: Progressing   Problem: Health Behavior/Discharge Planning: Goal: Ability to identify changes in lifestyle to reduce recurrence of condition will improve 03/22/2021 1734 by Baron Sane, RN Outcome: Adequate for Discharge 03/22/2021 0854 by Baron Sane, RN Outcome: Progressing Goal: Identification of resources available to assist in meeting health care needs will improve 03/22/2021 1734 by Baron Sane, RN Outcome: Adequate for Discharge 03/22/2021 0854 by Baron Sane, RN Outcome: Progressing   Problem: Physical Regulation: Goal: Complications related to the disease process, condition or treatment will be avoided or minimized Outcome: Adequate for Discharge   Problem: Safety: Goal: Ability to remain free from injury will improve Outcome: Adequate for Discharge   Problem: Education: Goal: Ability to make informed decisions regarding treatment will improve Outcome: Adequate for Discharge   Problem: Coping: Goal: Coping ability will improve Outcome: Adequate for Discharge   Problem: Health Behavior/Discharge Planning: Goal: Identification of resources available to  assist in meeting health care needs will improve Outcome: Adequate for Discharge   Problem: Medication: Goal: Compliance with prescribed medication regimen will improve Outcome: Adequate for Discharge   Problem: Self-Concept: Goal: Ability to disclose and discuss suicidal ideas will improve Outcome: Adequate for Discharge Goal: Will verbalize positive feelings about self Outcome: Adequate for Discharge   Problem: Education: Goal: Knowledge of [facility] Education information/materials will improve Outcome: Adequate for Discharge Goal: Emotional status will improve Outcome: Adequate for Discharge Goal: Mental status will improve Outcome: Adequate for Discharge Goal: Verbalization of understanding the information provided will improve Outcome: Adequate for Discharge   Problem: Activity: Goal: Interest or engagement in activities will improve Outcome: Adequate for Discharge Goal: Sleeping patterns will improve Outcome: Adequate for Discharge   Problem: Coping: Goal: Ability to verbalize frustrations and anger appropriately will improve Outcome: Adequate for Discharge Goal: Ability to demonstrate self-control will improve Outcome: Adequate for Discharge   Problem: Health Behavior/Discharge Planning: Goal: Identification of resources available to assist in meeting health care needs will improve Outcome: Adequate for Discharge Goal: Compliance with treatment plan for underlying cause of condition will improve Outcome: Adequate for Discharge   Problem: Physical Regulation: Goal: Ability to maintain clinical measurements within normal limits will improve Outcome: Adequate for Discharge   Problem: Safety: Goal: Periods of time without injury will increase Outcome: Adequate for Discharge

## 2021-03-22 NOTE — BHH Suicide Risk Assessment (Signed)
[facility] Discharge Suicide Risk Assessment   Principal Problem: MDD (major depressive disorder), recurrent episode, severe ([place]) Discharge Diagnoses: Principal Problem:   MDD (major depressive disorder), recurrent episode, severe ([place]) Active Problems:   Alcohol dependence ([place])   Generalized anxiety disorder   Total Time spent with patient: 20 minutes  Musculoskeletal: Strength & Muscle Tone: within normal limits Gait & Station: normal Patient leans: N/A  Psychiatric Specialty Exam: Review of Systems  Constitutional: Negative for fever.  HENT: Negative for congestion and sore throat.   Eyes: Negative for discharge.  Respiratory: Negative for cough and shortness of breath.   Cardiovascular: Negative for chest pain and palpitations.  Gastrointestinal: Negative for nausea and vomiting.  Genitourinary: Negative for difficulty urinating.  Musculoskeletal: Negative for arthralgias and myalgias.  Neurological: Negative for dizziness, tremors and headaches.  Psychiatric/Behavioral: Negative for dysphoric mood, hallucinations, self-injury, sleep disturbance and suicidal ideas. The patient is nervous/anxious.     Blood pressure 101/70, pulse 81, temperature 97.9 F (36.6 C), temperature source Oral, resp. rate 16, height 5\' 9"  (1.753 m), weight 60.8 kg, SpO2 100 %.Body mass index is 19.79 kg/m.  General Appearance: Casual  Eye Contact::  Good  Speech:  Clear and Coherent and Normal Rate  Volume:  Normal  Mood:  "Better"     Affect:  Appropriate and Less anxious  Thought Process:  Coherent, Goal Directed and Linear  Orientation:  Full (Time, Place, and Person)  Thought Content:  Logical  Suicidal Thoughts:  No  Homicidal Thoughts:  No  Memory:  Immediate;   Good Recent;   Good Remote;   Good  Judgement:  Fair  Insight:  Fair  Psychomotor Activity:  Normal  Concentration:  Good  Recall:  Good  Fund of Knowledge:Good  Language: Good  Akathisia:  No    AIMS (if indicated):      Assets:  Communication Skills Desire for Improvement Housing Physical Health Resilience Social Support  Sleep:  Number of Hours: 6.75  Cognition: WNL  ADL's:  Intact   Mental Status Per Nursing Assessment::   On Admission:  Suicidal ideation indicated by patient  Demographic Factors:  Male, Low socioeconomic status and Unemployed  Loss Factors: Decrease in vocational status and Financial problems/change in socioeconomic status  Historical Factors: Prior suicide attempts, Family history of suicide, Family history of mental illness or substance abuse and Impulsivity   Risk Reduction Factors:   Sense of responsibility to family, Living with another person, especially a relative and Positive social support  Continued Clinical Symptoms:  Anxiety - improved Depression - improved Alcohol/Substance Abuse/Dependencies More than one psychiatric diagnosis Previous Psychiatric Diagnoses and Treatments  Cognitive Features That Contribute To Risk:  None    Suicide Risk:  Mild:  Suicidal ideation of limited frequency, intensity, duration, and specificity.  There are no identifiable plans, no associated intent, mild dysphoria and related symptoms, good self-control (both objective and subjective assessment), few other risk factors, and identifiable protective factors, including available and accessible social support.   Follow-up Information    Services, [facility] Follow up.   Why: Please contact this provider daily to find out if there is a bed available.  Contact information: [address] [place] [address] [phone]        [facility]. Go on 03/29/2021.   Specialty: Behavioral Health Why: You have an appointment to start substance abuse intensive outpatient therapy (SAIOP) on 03/29/21 at 3:00 pm.  You also have an appointment for 04/15/21  at 8:00 am. These appointments will be held in person. Contact information: [place] [phone]              Plan Of Care/Follow-up recommendations:  Activity:  as tolerated  Other:   - Take medications as prescribed.   - Do not drink alcohol.  Do not use marijuana or other drugs.   - Attend residential alcohol use disorder treatment program.   - Attend 12-step group and get sponsor.   - Keep appointments with outpatient psychiatrist and outpatient therapist.   - See primary care provider for treatment of medical issues (blood pressure,  cholesterol, chronic foot numbness, etc).  Arthor Captain, MD 03/22/2021, 8:20 AM

## 2021-03-23 NOTE — BHH Group Notes (Signed)
ADULT GRIEF GROUP NOTE:   Spiritual care group on grief and loss facilitated by Kathrynn Humble, [facility]   Group Goal:   Support / Education around grief and loss   Members engage in facilitated group support and psycho-social education.   Group Description:   Following introductions and group rules, group members engaged in facilitated group dialog and support around topic of loss, with particular support around experiences of loss in their lives. Group Identified types of loss (relationships / self / things) and identified patterns, circumstances, and changes that precipitate losses. Reflected on thoughts / feelings around loss, normalized grief responses, and recognized variety in grief experience. Group noted Worden's four tasks of grief in discussion.   Group drew on Adlerian / Rogerian, narrative, MI,   Patient Progress:  Kable attended and participated in group.  He shared about his own experiences with loss and the emotions that accompanied grief for him.  He was a quiet, but active participant in the group.    [place], [place] Pager, [phone] 10:19 AM

## 2021-03-29 ENCOUNTER — Ambulatory Visit (HOSPITAL_COMMUNITY): Payer: No Payment, Other | Admitting: Behavioral Health

## 2021-04-07 ENCOUNTER — Other Ambulatory Visit: Payer: Self-pay

## 2021-04-07 ENCOUNTER — Ambulatory Visit: Payer: Self-pay | Admitting: Physician Assistant

## 2021-04-07 VITALS — BP 123/71 | HR 80 | Temp 98.7°F | Resp 18 | Ht 69.0 in | Wt 147.0 lb

## 2021-04-07 DIAGNOSIS — L309 Dermatitis, unspecified: Secondary | ICD-10-CM

## 2021-04-07 DIAGNOSIS — F1010 Alcohol abuse, uncomplicated: Secondary | ICD-10-CM

## 2021-04-07 DIAGNOSIS — E7849 Other hyperlipidemia: Secondary | ICD-10-CM

## 2021-04-07 DIAGNOSIS — I10 Essential (primary) hypertension: Secondary | ICD-10-CM | POA: Insufficient documentation

## 2021-04-07 DIAGNOSIS — R7303 Prediabetes: Secondary | ICD-10-CM

## 2021-04-07 DIAGNOSIS — F322 Major depressive disorder, single episode, severe without psychotic features: Secondary | ICD-10-CM

## 2021-04-07 DIAGNOSIS — F411 Generalized anxiety disorder: Secondary | ICD-10-CM

## 2021-04-07 MED ORDER — ESCITALOPRAM OXALATE 20 MG PO TABS: 20.0000 mg | ORAL_TABLET | Freq: Every day | ORAL | 1 refills | Status: DC

## 2021-04-07 MED ORDER — NYSTATIN-TRIAMCINOLONE 100000-0.1 UNIT/GM-% EX OINT: 1.0000 "application " | TOPICAL_OINTMENT | Freq: Two times a day (BID) | CUTANEOUS | 0 refills | Status: DC

## 2021-04-07 NOTE — Progress Notes (Signed)
New Patient Office Visit  Subjective:  Patient ID: [name] [name], male    DOB: 31-Oct-1961  Age: 60 y.o. MRN: [id number]  CC:  Chief Complaint  Patient presents with  . Eczema    HPI [name] [name] states that he does not feel that his lexapro is not offering rleief from his anxiety, use to get panic attacks, states that he is able to work through them now. States that he has been taking from [place] for the past 8 years  States that he is currently residing at [facility] [place] treatment until the middle of June, will be staying in the [place] area.  Takes Gabapentin BID for neuropathy for the past few months,has only been taking it for the past three weeks, unsure if it is offering relief.  States that he takes hydroxyzine 25 mg and trazodone 50 mg with rleief of sleep insomnia, states that the trazodone is new   States that he has dry itchy patches on his face, both cheeks, and scalp, been an issue for many years , has previously had used prescription creams with relief, unsure of the prescription.    Past Medical History:  Diagnosis Date  . Alcohol abuse    Last Drink on Sep 2018  . Anxiety    About 60 years of age  . Carpal tunnel syndrome of right wrist Dx 2015  . Depression    At 60 years of age  . GERD (gastroesophageal reflux disease) Dx 2007  . Hepatitis, alcoholic, acute 6270  . Hyperlipidemia    borderline, diet controlled  . Pancreatitis Dx 2014  . Pneumonia 11/2014  . 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 Reflux Dx 2007  . 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 carpel tunnel release Right 07/28/2014    done in [place]   . ELBOW SURGERY    . KNEE ARTHROSCOPY    . KNEE SURGERY Left    15 years ago  . LIPOMA EXCISION N/A 05/10/2016   Procedure: EXCISION OF SCALP LIPOMA;  Surgeon: Johnathan Hausen, MD;  Location: [facility];  Service: General;  Laterality: N/A;    Family  History  Problem Relation Age of Onset  . Diabetes Father   . Hypertension Father   . Hypertension Paternal Grandfather   . Alcohol abuse Paternal Grandfather   . Alcohol abuse Maternal Grandfather   . Alcohol abuse Maternal Grandmother   . Alcohol abuse Paternal Grandmother   . Colon cancer Neg Hx   . Rectal cancer Neg Hx   . Stomach cancer Neg Hx   . Bipolar disorder Neg Hx   . Depression Neg Hx     Social History   Socioeconomic History  . Marital status: Legally Separated    Spouse name: Not on file  . Number of children: 2  . Years of education: 13 yrs  . Highest education level: Not on file  Occupational History  . Occupation: custodian    Comment: [employer]  Tobacco Use  . Smoking status: Never Smoker  . Smokeless tobacco: Never Used  Vaping Use  . Vaping Use: Never used  Substance and Sexual Activity  . Alcohol use: Yes    Alcohol/week: 0.0 standard drinks    Comment: last use today 05/03/18   . Drug use: Not Currently    Types: Marijuana    Comment: Patient denies   . Sexual activity: Not on file  Other Topics Concern  . Not on file  Social History Narrative   Born and raised in [place], [place] by both parents. He has 2 sisters and is the middle child. He remains close with family. He was married for 16 yrs and seperated in 2005. He has 2 boys who are in their 20's now. He lives alone in [place]. He is working as a Horticulturist, commercial: Not on [employer] Insecurity: Not on file  Transportation Needs: Not on file  Physical Activity: Not on file  Stress: Not on file  Social Connections: Not on file  Intimate Partner Violence: Not on file    ROS Review of Systems  Constitutional: Negative for chills and fever.  HENT: Negative.   Eyes: Negative.   Respiratory: Negative for shortness of breath.   Cardiovascular: Negative for chest pain.  Gastrointestinal: Negative.   Endocrine: Negative.    Genitourinary: Negative.   Musculoskeletal: Negative.   Skin: Positive for rash.  Allergic/Immunologic: Negative.   Neurological: Negative for headaches.  Hematological: Negative.   Psychiatric/Behavioral: Positive for dysphoric mood. Negative for self-injury, sleep disturbance and suicidal ideas. The patient is nervous/anxious.     Objective:   Today's Vitals: BP 123/71 (BP Location: Left Arm, Patient Position: Sitting, Cuff Size: Normal)   Pulse 80   Temp 98.7 F (37.1 C) (Oral)   Resp 18   Ht 5\' 9"  (1.753 m)   Wt 147 lb (66.7 kg)   SpO2 98%   BMI 21.71 kg/m   Physical Exam Vitals and nursing note reviewed.  Constitutional:      Appearance: Normal appearance.  HENT:     Head: Normocephalic and atraumatic.     Right Ear: External ear normal.     Left Ear: External ear normal.     Nose: Nose normal.     Mouth/Throat:     Mouth: Mucous membranes are moist.     Pharynx: Oropharynx is clear.  Eyes:     Extraocular Movements: Extraocular movements intact.     Conjunctiva/sclera: Conjunctivae normal.     Pupils: Pupils are equal, round, and reactive to light.  Cardiovascular:     Rate and Rhythm: Normal rate and regular rhythm.     Pulses: Normal pulses.     Heart sounds: Normal heart sounds.  Pulmonary:     Effort: Pulmonary effort is normal.     Breath sounds: Normal breath sounds.  Musculoskeletal:        General: Normal range of motion.     Cervical back: Normal range of motion and neck supple.  Skin:    General: Skin is warm and dry.     Findings: Rash present. Rash is scaling.     Comments: Scattered patches of scaling discoloration on both cheeks, nose,  forehead and scalp  Neurological:     General: No focal deficit present.     Mental Status: He is alert and oriented to person, place, and time.  Psychiatric:        Mood and Affect: Mood normal.        Behavior: Behavior normal.        Thought Content: Thought content normal.        Judgment: Judgment  normal.     Assessment & Plan:   Problem List Items Addressed This Visit   None     Outpatient Encounter Medications as of 04/07/2021  Medication Sig  . amLODipine (NORVASC) 5  MG tablet Take 1 tablet (5 mg total) by mouth daily.  Marland Kitchen atorvastatin (LIPITOR) 20 MG tablet Take 1 tablet (20 mg total) by mouth daily.  Marland Kitchen escitalopram (LEXAPRO) 10 MG tablet Take 1 tablet (10 mg total) by mouth daily.  Marland Kitchen gabapentin (NEURONTIN) 100 MG capsule Take 1 capsule (100 mg total) by mouth 2 (two) times daily.  . hydrOXYzine (ATARAX/VISTARIL) 25 MG tablet Take 1 tablet (25 mg total) by mouth 3 (three) times daily as needed for anxiety.  . traZODone (DESYREL) 50 MG tablet Take 1 tablet (50 mg total) by mouth at bedtime as needed for sleep.   No facility-administered encounter medications on file as of 04/07/2021.  1. Generalized anxiety disorder Increase Lexapro to 20 mg.  Patient to return to mobile unit in 2 weeks for follow-up.  Encourage patient to use 1/2 tablet of previously prescribed hydroxyzine every 8 hours as needed to help with anxiety. - escitalopram (LEXAPRO) 20 MG tablet; Take 1 tablet (20 mg total) by mouth daily.  Dispense: 30 tablet; Refill: 1  2. Major depressive disorder, severe ([place])   3. Alcohol abuse   4. Essential hypertension Continue current regimen  5. Other hyperlipidemia Continue current regimen  6. Dermatitis of face Trial Mycolog - nystatin-triamcinolone ointment (MYCOLOG); Apply 1 application topically 2 (two) times daily.  Dispense: 30 g; Refill: 0  7. Prediabetes A1c 5.9, this was during hospitalization in April 2022, patient was still drinking heavily at that time.  Encouraged patient to have blood glucose checks several times a week, keep a written log and have available for all office visits.   I have reviewed the patient's medical history (PMH, PSH, Social History, Family History, Medications, and allergies) , and have been updated if relevant. I spent 36  minutes reviewing chart and  face to face time with patient.      Follow-up: No follow-ups on file.   Loraine Grip Mayers, PA-C

## 2021-04-07 NOTE — Progress Notes (Signed)
Patient has taken medication and patient has eaten today. Patient reports exzcema on his face.  Patient shares the lexapro is not providing relief.

## 2021-04-07 NOTE — Patient Instructions (Signed)
For your generalized anxiety disorder, you will now take 20 mg of Lexapro once a day.  I encourage you to use 1/2-1 full tablet of hydroxyzine every 8 hours as needed during the day to help you with anxiety as well.   For the rash on your face, you will use Mycolog twice a day until resolved.  For your prediabetes, I do encourage you to have blood glucose checks a few times a week.  Please return to the mobile unit in 2 weeks for follow-up.  Kennieth Rad, PA-C Physician Assistant [facility] Medicine [email or web address]     Diabetes Care, 44(Suppl 1), [phone]. https://doi.org/https://doi.org/10.2337/dc21-S003">  Prediabetes Prediabetes is when your blood sugar (blood glucose) level is higher than normal but not high enough for you to be diagnosed with type 2 diabetes. Having prediabetes puts you at risk for developing type 2 diabetes (type 2 diabetes mellitus). With certain lifestyle changes, you may be able to prevent or delay the onset of type 2 diabetes. This is important because type 2 diabetes can lead to serious complications, such as:  Heart disease.  Stroke.  Blindness.  Kidney disease.  Depression.  Poor circulation in the feet and legs. In severe cases, this could lead to surgical removal of a leg (amputation). What are the causes? The exact cause of prediabetes is not known. It may result from insulin resistance. Insulin resistance develops when cells in the body do not respond properly to insulin that the body makes. This can cause excess glucose to build up in the blood. High blood glucose (hyperglycemia) can develop. What increases the risk? The following factors may make you more likely to develop this condition:  You have a family member with type 2 diabetes.  You are older than 45 years.  You had a temporary form of diabetes during a pregnancy (gestational diabetes).  You had polycystic ovary syndrome  (PCOS).  You are overweight or obese.  You are inactive (sedentary).  You have a history of heart disease, including problems with cholesterol levels, high levels of blood fats, or high blood pressure. What are the signs or symptoms? You may have no symptoms. If you do have symptoms, they may include:  Increased hunger.  Increased thirst.  Increased urination.  Vision changes, such as blurry vision.  Tiredness (fatigue). How is this diagnosed? This condition can be diagnosed with blood tests. Your blood glucose may be checked with one or more of the following tests:  A fasting blood glucose (FBG) test. You will not be allowed to eat (you will fast) for at least 8 hours before a blood sample is taken.  An A1C blood test (hemoglobin A1C). This test provides information about blood glucose levels over the previous 2?3 months.  An oral glucose tolerance test (OGTT). This test measures your blood glucose at two points in time: ? After fasting. This is your baseline level. ? Two hours after you drink a beverage that contains glucose. You may be diagnosed with prediabetes if:  Your FBG is 100?125 mg/dL (5.6-6.9 mmol/L).  Your A1C level is 5.7?6.4% (39-46 mmol/mol).  Your OGTT result is 140?199 mg/dL (7.8-11 mmol/L). These blood tests may be repeated to confirm your diagnosis.   How is this treated? Treatment may include dietary and lifestyle changes to help lower your blood glucose and prevent type 2 diabetes from developing. In some cases, medicine may be prescribed to help lower the risk of type 2 diabetes. Follow these instructions at home: Nutrition  Follow  a healthy meal plan. This includes eating lean proteins, whole grains, legumes, fresh fruits and vegetables, low-fat dairy products, and healthy fats.  Follow instructions from your health care provider about eating or drinking restrictions.  Meet with a dietitian to create a healthy eating plan that is right for you.    Lifestyle  Do moderate-intensity exercise for at least 30 minutes a day on 5 or more days each week, or as told by your health care provider. A mix of activities may be best, such as: ? Brisk walking, swimming, biking, and weight lifting.  Lose weight as told by your health care provider. Losing 5-7% of your body weight can reverse insulin resistance.  Do not drink alcohol if: ? Your health care provider tells you not to drink. ? You are pregnant, may be pregnant, or are planning to become pregnant.  If you drink alcohol: ? Limit how much you use to:  0-1 drink a day for women.  0-2 drinks a day for men. ? Be aware of how much alcohol is in your drink. In the [place] one drink equals one 12 oz bottle of beer (355 mL), one 5 oz glass of wine (148 mL), or one 1 oz glass of hard liquor (44 mL). General instructions  Take over-the-counter and prescription medicines only as told by your health care provider. You may be prescribed medicines that help lower the risk of type 2 diabetes.  Do not use any products that contain nicotine or tobacco, such as cigarettes, e-cigarettes, and chewing tobacco. If you need help quitting, ask your health care provider.  Keep all follow-up visits. This is important. Where to find more information  American Diabetes Association: www.diabetes.org  Academy of Nutrition and Dietetics: www.eatright.org  American Heart Association: www.heart.org Contact a health care provider if:  You have any of these symptoms: ? Increased hunger. ? Increased urination. ? Increased thirst. ? Fatigue. ? Vision changes, such as blurry vision. Get help right away if you:  Have shortness of breath.  Feel confused.  Vomit or feel like you may vomit. Summary  Prediabetes is when your blood sugar (blood glucose)level is higher than normal but not high enough for you to be diagnosed with type 2 diabetes.  Having prediabetes puts you at risk for developing type 2  diabetes (type 2 diabetes mellitus).  Make lifestyle changes such as eating a healthy diet and exercising regularly to help prevent diabetes. Lose weight as told by your health care provider. This information is not intended to replace advice given to you by your health care provider. Make sure you discuss any questions you have with your health care provider. Document Revised: 02/12/2020 Document Reviewed: 02/12/2020 Elsevier Patient Education  [place].

## 2021-04-15 ENCOUNTER — Ambulatory Visit (HOSPITAL_COMMUNITY): Payer: No Payment, Other | Admitting: Psychiatry

## 2021-04-15 ENCOUNTER — Other Ambulatory Visit: Payer: Self-pay

## 2021-04-15 ENCOUNTER — Ambulatory Visit: Payer: Self-pay | Attending: Family Medicine

## 2021-06-08 ENCOUNTER — Ambulatory Visit (HOSPITAL_COMMUNITY): Payer: No Payment, Other | Admitting: Physician Assistant

## 2021-06-08 ENCOUNTER — Other Ambulatory Visit: Payer: Self-pay

## 2021-06-11 ENCOUNTER — Emergency Department (HOSPITAL_COMMUNITY): Payer: Self-pay

## 2021-06-11 ENCOUNTER — Other Ambulatory Visit: Payer: Self-pay

## 2021-06-11 ENCOUNTER — Emergency Department (HOSPITAL_COMMUNITY)
Admission: EM | Admit: 2021-06-11 | Discharge: 2021-06-11 | Disposition: A | Discharge status: Home / Self Care | Payer: Self-pay | Attending: Emergency Medicine | Admitting: Emergency Medicine

## 2021-06-11 ENCOUNTER — Encounter (HOSPITAL_COMMUNITY): Payer: Self-pay

## 2021-06-11 DIAGNOSIS — Z20822 Contact with and (suspected) exposure to covid-19: Secondary | ICD-10-CM | POA: Insufficient documentation

## 2021-06-11 DIAGNOSIS — R29898 Other symptoms and signs involving the musculoskeletal system: Secondary | ICD-10-CM

## 2021-06-11 DIAGNOSIS — F10920 Alcohol use, unspecified with intoxication, uncomplicated: Secondary | ICD-10-CM | POA: Insufficient documentation

## 2021-06-11 DIAGNOSIS — M6281 Muscle weakness (generalized): Secondary | ICD-10-CM | POA: Insufficient documentation

## 2021-06-11 DIAGNOSIS — R1013 Epigastric pain: Secondary | ICD-10-CM | POA: Insufficient documentation

## 2021-06-11 DIAGNOSIS — Y908 Blood alcohol level of 240 mg/100 ml or more: Secondary | ICD-10-CM | POA: Insufficient documentation

## 2021-06-11 LAB — CBC WITH DIFFERENTIAL/PLATELET
Abs Immature Granulocytes: 0.04 10*3/uL (ref 0.00–0.07)
Basophils Absolute: 0 10*3/uL (ref 0.0–0.1)
Basophils Relative: 0 %
Eosinophils Absolute: 0 10*3/uL (ref 0.0–0.5)
Eosinophils Relative: 0 %
HCT: 42.2 % (ref 39.0–52.0)
Hemoglobin: 13.7 g/dL (ref 13.0–17.0)
Immature Granulocytes: 1 %
Lymphocytes Relative: 30 %
Lymphs Abs: 2.3 10*3/uL (ref 0.7–4.0)
MCH: 26.2 pg (ref 26.0–34.0)
MCHC: 32.5 g/dL (ref 30.0–36.0)
MCV: 80.8 fL (ref 80.0–100.0)
Monocytes Absolute: 0.8 10*3/uL (ref 0.1–1.0)
Monocytes Relative: 10 %
Neutro Abs: 4.7 10*3/uL (ref 1.7–7.7)
Neutrophils Relative %: 59 %
Platelets: 188 10*3/uL (ref 150–400)
RBC: 5.22 MIL/uL (ref 4.22–5.81)
RDW: 16.2 % — ABNORMAL HIGH (ref 11.5–15.5)
WBC: 7.9 10*3/uL (ref 4.0–10.5)
nRBC: 0 % (ref 0.0–0.2)

## 2021-06-11 LAB — COMPREHENSIVE METABOLIC PANEL
ALT: 26 U/L (ref 0–44)
AST: 40 U/L (ref 15–41)
Albumin: 5.1 g/dL — ABNORMAL HIGH (ref 3.5–5.0)
Alkaline Phosphatase: 45 U/L (ref 38–126)
Anion gap: 14 (ref 5–15)
BUN: 7 mg/dL (ref 6–20)
CO2: 26 mmol/L (ref 22–32)
Calcium: 9.1 mg/dL (ref 8.9–10.3)
Chloride: 92 mmol/L — ABNORMAL LOW (ref 98–111)
Creatinine, Ser: 0.97 mg/dL (ref 0.61–1.24)
GFR, Estimated: >60 mL/min (ref >60)
Glucose, Bld: 147 mg/dL — ABNORMAL HIGH (ref 70–99)
Potassium: 4.3 mmol/L (ref 3.5–5.1)
Sodium: 132 mmol/L — ABNORMAL LOW (ref 135–145)
Total Bilirubin: 1.1 mg/dL (ref 0.3–1.2)
Total Protein: 9.2 g/dL — ABNORMAL HIGH (ref 6.5–8.1)

## 2021-06-11 LAB — RESP PANEL BY RT-PCR (FLU A&B, COVID) ARPGX2
Influenza A by PCR: NEGATIVE
Influenza B by PCR: NEGATIVE
SARS Coronavirus 2 by RT PCR: NEGATIVE

## 2021-06-11 LAB — LIPASE, BLOOD: Lipase: 49 U/L (ref 11–51)

## 2021-06-11 LAB — ETHANOL: Alcohol, Ethyl (B): 286 mg/dL — ABNORMAL HIGH (ref <10)

## 2021-06-11 LAB — APTT: aPTT: 28 seconds (ref 24–36)

## 2021-06-11 LAB — PROTIME-INR
INR: 0.9 (ref 0.8–1.2)
Prothrombin Time: 12.5 seconds (ref 11.4–15.2)

## 2021-06-11 MED ORDER — SUCRALFATE 1 GM/10ML PO SUSP
1.0000 g | Freq: Once | ORAL | Status: AC
  Administered 2021-06-11: 1 g via ORAL
  Filled 2021-06-11: qty 10

## 2021-06-11 MED ORDER — LORAZEPAM 2 MG/ML IJ SOLN
1.0000 mg | Freq: Once | INTRAMUSCULAR | Status: AC
  Administered 2021-06-11: 1 mg via INTRAVENOUS
  Filled 2021-06-11: qty 1

## 2021-06-11 MED ORDER — METOCLOPRAMIDE HCL 5 MG/ML IJ SOLN
10.0000 mg | Freq: Once | INTRAMUSCULAR | Status: AC
  Administered 2021-06-11: 10 mg via INTRAVENOUS
  Filled 2021-06-11: qty 2

## 2021-06-11 MED ORDER — PANTOPRAZOLE SODIUM 40 MG IV SOLR
40.0000 mg | Freq: Once | INTRAVENOUS | Status: AC
  Administered 2021-06-11: 40 mg via INTRAVENOUS
  Filled 2021-06-11: qty 40

## 2021-06-11 NOTE — ED Triage Notes (Signed)
Patient BIB [facility] from home outside sitting in a lawn chair. Patient complaining of right arm weakness and abdominal pain. Patients grip strength in his left arm 5/5, 0/5 grip strength in right arm and he is unable to hold arm up at all without assistance. 5/5 strength in both legs. Patient said he has been drinking all day. Patient can feel RN touch his right arm, but it feels numb and tingling. Alert and oriented x3, did not know what month it is.

## 2021-06-11 NOTE — ED Provider Notes (Signed)
Patient signed to me by Dr. Florina Ou pending results of MRI of brain and cervical spine.  Those did not show any acute findings.  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  They recommend outpatient follow-up.  He does have an elevated alcohol level here.  I examined the patient here and he strength is 5 of 5 in upper as well as lower extremities.  This could be a Saturday night palsy.  Will give neurology referral   Lacretia Leigh, MD 06/11/21 1208

## 2021-06-11 NOTE — ED Notes (Signed)
Last Known Well at midnight.

## 2021-06-11 NOTE — ED Notes (Signed)
Urinal at bedside. Patient unable to provide sample at this time.

## 2021-06-11 NOTE — ED Provider Notes (Signed)
[facility] DEPT Provider Note: Georgena Spurling, MD, FACEP  CSN: [id number] MRN: [id number] ARRIVAL: 06/11/21 at [place]: [place]  Weakness   HISTORY OF PRESENT ILLNESS  06/11/21 5:40 AM [name] [name] is a 60 y.o. male with a history of alcoholism and alcohol-related complications (gastritis, hepatitis, pancreatitis and neuropathy).  He called EMS this morning because he noticed his right arm was weak, particularly the right hand.  He cannot tell me exactly when he first noticed this but it was at least 5 hours ago.  He admits to being on a 3-day bender, drinking both beer and liquor.  EMS found him in a lawn chair outside drinking liquor.  He is having no weakness of his legs and is having no sensory loss in his right arm.  He has chronic altered sensation of his feet due to neuropathy.  He is right-hand dominant.  He is also having epigastric pain which he states feels like his gastritis.   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    Past Surgical History:  Procedure Laterality Date   carpel tunnel release Right 07/28/2014    done in [facility] ARTHROSCOPY     KNEE SURGERY Left    15 years ago   LIPOMA EXCISION N/A 05/10/2016   Procedure: EXCISION OF SCALP LIPOMA;  Surgeon: Johnathan Hausen, MD;  Location: [facility];  Service: General;  Laterality: N/A;    Family History  Problem Relation Age of Onset   Diabetes Father    Hypertension Father    Hypertension Paternal Grandfather    Alcohol abuse  Paternal Grandfather    Alcohol abuse Maternal Grandfather    Alcohol abuse Maternal Grandmother    Alcohol abuse Paternal Grandmother    Colon cancer Neg Hx    Rectal cancer Neg Hx    Stomach cancer Neg Hx    Bipolar disorder Neg Hx    Depression Neg Hx     Social History   Tobacco Use   Smoking status: Never   Smokeless tobacco: Never  Vaping Use   Vaping Use: Never used  Substance Use Topics   Alcohol use: Yes    Alcohol/week: 12.0 standard drinks    Types: 12 Standard drinks or equivalent per week   Drug use: Not Currently    Types: Marijuana    Comment: Patient denies     Prior to Admission medications   Medication Sig Start Date End Date Taking? Authorizing Provider  amLODipine (NORVASC) 5 MG tablet Take 1 tablet (5 mg total) by mouth daily. 03/22/21   Ethelene Hal, NP  atorvastatin (LIPITOR) 20 MG tablet Take 1 tablet (20 mg total) by mouth daily. 03/22/21   Ethelene Hal, NP  escitalopram (LEXAPRO) 20 MG tablet Take 1 tablet (20 mg total) by mouth daily. 04/07/21   Mayers, Cari S, PA-C  gabapentin (NEURONTIN) 100 MG capsule Take 1 capsule (  100 mg total) by mouth 2 (two) times daily. 03/22/21   Ethelene Hal, NP  hydrOXYzine (ATARAX/VISTARIL) 25 MG tablet Take 1 tablet (25 mg total) by mouth 3 (three) times daily as needed for anxiety. 03/22/21   Ethelene Hal, NP  nystatin-triamcinolone ointment [facility]) Apply 1 application topically 2 (two) times daily. 04/07/21   Mayers, Cari S, PA-C  traZODone (DESYREL) 50 MG tablet Take 1 tablet (50 mg total) by mouth at bedtime as needed for sleep. 03/22/21   Ethelene Hal, NP    Allergies Oxycodone   REVIEW OF SYSTEMS  Negative except as noted here or in the History of Present Illness.   PHYSICAL EXAMINATION  Initial Vital Signs Blood pressure (!) 127/91, pulse 87, temperature 98 F (36.7 C), temperature source Oral, resp. rate 20, height 5\' 9"  (1.753 m), weight 63.5 kg, SpO2 100  %.  Examination General: Well-developed, well-nourished male in no acute distress; appearance consistent with age of record HENT: normocephalic; atraumatic Eyes: pupils equal, round and reactive to light; extraocular muscles intact Neck: supple Heart: regular rate and rhythm Lungs: clear to auscultation bilaterally; occasional hiccups Abdomen: soft; nondistended; epigastric tenderness; no hepatomegaly palpated; bowel sounds present Extremities: No deformity; full range of motion; pulses normal Neurologic: Awake, alert and oriented to person, place, year but not month; motor strength intact and symmetric in lower extremities, motor strength plus 5 out of 5 in left upper extremity, right upper extremity with plus 3 out of 5 weakness of all movements including flexion and extension of the fingers, wrist, and elbow and movement of the right shoulder; sensation intact and symmetric in the upper and lower extremities; no facial droop; tongue midline with symmetric movement skin: Warm and dry Psychiatric: Mildly anxious   RESULTS  Summary of this visit's results, reviewed and interpreted by myself:   EKG Interpretation  Date/Time:  Saturday June 11 2021 05:42:51 EDT Ventricular Rate:  92 PR Interval:  146 QRS Duration: 72 QT Interval:  329 QTC Calculation: 407 R Axis:   87 Text Interpretation: Sinus rhythm Probable LVH with secondary repol abnrm Probable lateral infarct, age indeterminate Anterior Q waves, possibly due to LVH Tall peaked T's not seen previously Confirmed by Shanon Rosser [phone]) on 06/11/2021 5:45:16 AM       Laboratory Studies: Results for orders placed or performed during the hospital encounter of 06/11/21 (from the past 24 hour(s))  Resp Panel by RT-PCR (Flu A&B, Covid) Nasopharyngeal Swab     Status: None   Collection Time: 06/11/21  5:42 AM   Specimen: Nasopharyngeal Swab; Nasopharyngeal(NP) swabs in vial transport medium  Result Value Ref Range   SARS Coronavirus  2 by RT PCR NEGATIVE NEGATIVE   Influenza A by PCR NEGATIVE NEGATIVE   Influenza B by PCR NEGATIVE NEGATIVE  Ethanol     Status: Abnormal   Collection Time: 06/11/21  5:42 AM  Result Value Ref Range   Alcohol, Ethyl (B) 286 (H) <10 mg/dL  Protime-INR     Status: None   Collection Time: 06/11/21  5:42 AM  Result Value Ref Range   Prothrombin Time 12.5 11.4 - 15.2 seconds   INR 0.9 0.8 - 1.2  APTT     Status: None   Collection Time: 06/11/21  5:42 AM  Result Value Ref Range   aPTT 28 24 - 36 seconds  Comprehensive metabolic panel     Status: Abnormal   Collection Time: 06/11/21  5:42 AM  Result Value Ref Range  Sodium 132 (L) 135 - 145 mmol/L   Potassium 4.3 3.5 - 5.1 mmol/L   Chloride 92 (L) 98 - 111 mmol/L   CO2 26 22 - 32 mmol/L   Glucose, Bld 147 (H) 70 - 99 mg/dL   BUN 7 6 - 20 mg/dL   Creatinine, Ser 0.97 0.61 - 1.24 mg/dL   Calcium 9.1 8.9 - 10.3 mg/dL   Total Protein 9.2 (H) 6.5 - 8.1 g/dL   Albumin 5.1 (H) 3.5 - 5.0 g/dL   AST 40 15 - 41 U/L   ALT 26 0 - 44 U/L   Alkaline Phosphatase 45 38 - 126 U/L   Total Bilirubin 1.1 0.3 - 1.2 mg/dL   GFR, Estimated >60 >60 mL/min   Anion gap 14 5 - 15  CBC with Differential/Platelet     Status: Abnormal   Collection Time: 06/11/21  5:42 AM  Result Value Ref Range   WBC 7.9 4.0 - 10.5 K/uL   RBC 5.22 4.22 - 5.81 MIL/uL   Hemoglobin 13.7 13.0 - 17.0 g/dL   HCT 42.2 39.0 - 52.0 %   MCV 80.8 80.0 - 100.0 fL   MCH 26.2 26.0 - 34.0 pg   MCHC 32.5 30.0 - 36.0 g/dL   RDW 16.2 (H) 11.5 - 15.5 %   Platelets 188 150 - 400 K/uL   nRBC 0.0 0.0 - 0.2 %   Neutrophils Relative % 59 %   Neutro Abs 4.7 1.7 - 7.7 K/uL   Lymphocytes Relative 30 %   Lymphs Abs 2.3 0.7 - 4.0 K/uL   Monocytes Relative 10 %   Monocytes Absolute 0.8 0.1 - 1.0 K/uL   Eosinophils Relative 0 %   Eosinophils Absolute 0.0 0.0 - 0.5 K/uL   Basophils Relative 0 %   Basophils Absolute 0.0 0.0 - 0.1 K/uL   Immature Granulocytes 1 %   Abs Immature Granulocytes  0.04 0.00 - 0.07 K/uL  Lipase, blood     Status: None   Collection Time: 06/11/21  5:42 AM  Result Value Ref Range   Lipase 49 11 - 51 U/L   Imaging Studies: CT HEAD WO CONTRAST  Result Date: 06/11/2021 CLINICAL DATA:  59 year old male with arm weakness. EXAM: CT HEAD WITHOUT CONTRAST TECHNIQUE: Contiguous axial images were obtained from the base of the skull through the vertex without intravenous contrast. COMPARISON:  Brain MRI 04/26/2017.  Head CT 04/25/2017. FINDINGS: Brain: Stable cerebral volume. No midline shift, ventriculomegaly, mass effect, evidence of mass lesion, intracranial hemorrhage or evidence of cortically based acute infarction. Suspected chronic encephalomalacia in the left superior cerebellum (series 3, image 12). But otherwise gray-white matter differentiation is within normal limits. No cerebral cortical encephalomalacia identified. Vascular: Trace Calcified atherosclerosis at the skull base. No suspicious intracranial vascular hyperdensity. Skull: Negative. Sinuses/Orbits: Visualized paranasal sinuses and mastoids are clear. Other: No acute orbit or scalp soft tissue finding. 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 IMPRESSION: 1. Evidence of chronic cerebellar ischemia, primarily in the Left SCA territory. 2. But otherwise largely negative for age noncontrast CT appearance of the brain. No acute intracranial abnormality. Electronically Signed   By: Genevie Ann M.D.   On: 06/11/2021 06:09    ED COURSE and MDM  Nursing notes, initial and subsequent vitals signs, including pulse oximetry, reviewed and interpreted by myself.  Vitals:   06/11/21 0541 06/11/21 0545 06/11/21 0630  BP:  (!) 127/91 124/90  Pulse:  87 73  Resp:  20 16  Temp:  98 F (36.7 C)   TempSrc:  Oral   SpO2:  100% 95%  Weight: 63.5 kg    Height: 5\' 9"  (1.753 m)     Medications  pantoprazole (PROTONIX) injection 40 mg (40 mg Intravenous Given 06/11/21 0549)  metoCLOPramide  (REGLAN) injection 10 mg (10 mg Intravenous Given 06/11/21 0548)  sucralfate (CARAFATE) 1 GM/10ML suspension 1 g (1 g Oral Given 06/11/21 0606)   5:53 AM Stroke order set initiated but Code Stroke not called as we cannot verify last time normal, and patient believes it was at least 5 hours ago.  6:34 AM Patient's lipase is normal so I suspect his epigastric pain is due to gastritis.  He was given IV Protonix and oral Carafate.  He was given Reglan for his hiccups.  The cause of his right upper extremity weakness is not determined but I have a strong suspicion of a focal stroke.  7:32 AM Discussed with Dr. Curly Shores of neurology and Dr. Hal Hope of the hospitalist service. Dr. Curly Shores requests that we obtain MRIs of the brain and cervical spine to determine whether the patient will be admitted by her to the stroke service or Dr. Hal Hope to the hospitalist service.  Dr. Zenia Resides to follow-up on MRI results.   PROCEDURES  Procedures   ED DIAGNOSES     ICD-10-CM   1. Weakness of right upper extremity  R29.898     2. Alcoholic intoxication without complication ([place])  F16.384     3. Epigastric pain  R10.13          Lorena Benham, Jenny Reichmann, MD 06/11/21 [phone]

## 2021-06-11 NOTE — ED Notes (Signed)
Patient reports he is claustrophobic & is requesting medication for MRI. Zenia Resides, MD notified

## 2021-06-13 ENCOUNTER — Encounter: Payer: Self-pay | Admitting: Neurology

## 2021-06-16 DIAGNOSIS — F1020 Alcohol dependence, uncomplicated: Secondary | ICD-10-CM | POA: Insufficient documentation

## 2021-07-04 ENCOUNTER — Other Ambulatory Visit (INDEPENDENT_AMBULATORY_CARE_PROVIDER_SITE_OTHER): Payer: Self-pay

## 2021-07-04 ENCOUNTER — Ambulatory Visit (INDEPENDENT_AMBULATORY_CARE_PROVIDER_SITE_OTHER): Payer: Self-pay | Admitting: Neurology

## 2021-07-04 ENCOUNTER — Encounter: Payer: Self-pay | Admitting: Neurology

## 2021-07-04 ENCOUNTER — Other Ambulatory Visit: Payer: Self-pay

## 2021-07-04 VITALS — BP 133/93 | HR 93 | Ht 69.0 in | Wt 154.8 lb

## 2021-07-04 DIAGNOSIS — R202 Paresthesia of skin: Secondary | ICD-10-CM

## 2021-07-04 DIAGNOSIS — G621 Alcoholic polyneuropathy: Secondary | ICD-10-CM

## 2021-07-04 LAB — B12 AND FOLATE PANEL
Folate: 12.2 ng/mL (ref >5.9)
Vitamin B-12: 361 pg/mL (ref 211–911)

## 2021-07-04 MED ORDER — GABAPENTIN 300 MG PO CAPS: ORAL_CAPSULE | ORAL | 5 refills | Status: DC

## 2021-07-04 NOTE — Progress Notes (Signed)
[facility] Clinic Note - Initial Visit   Date: 07/04/21  [name] [name] MRN: [id number] DOB: 1961-06-20   Dear Dr. Zenia Resides:  Thank you for your kind referral of [name] [name] for consultation of neuropathy. Although his history is well known to you, please allow [place] to reiterate it for the purpose of our medical record. The patient was accompanied to the clinic by self.    History of Present Illness: [name] [name] is a 60 y.o. right-handed male with hypertension, anxiety, alcohol abuse presenting for evaluation of neuropathy.   He has a long history alcohol  \abuse and been through rehab several times.  He is currently in alcohol rehab.  He previously was drinking 24 oz, 2-3 per day x many years.  Starting around June 2022, he began having numbness in the feet, lower legs, and right hand. He has loss of coordination with the right hand, such as with brushing his teeth or other fine motor tasks.  His legs tingle and burn from his mid-calf down into the feet.  Symptoms are constant. He went to the ER for these symptoms where MRI brain and cervical spine did not provide any structural explanation for symptoms so referred here for evaluation.    He is unemployed. He is currently homeless.  Out-side paper records, electronic medical record, and images have been reviewed where available and summarized as:  MRI cervical spine wo contrast 06/11/2021: C2-3: Mild disc degeneration. Moderate left foraminal narrowing due to uncinate spurring and facet hypertrophy. C3-4: Disc degeneration with diffuse uncinate spurring. Mild spinal stenosis and mild foraminal stenosis bilaterally C4-5: Disc degeneration with diffuse uncinate spurring. Moderate right foraminal stenosis. Cord flattening with mild spinal stenosis. C5-6: Disc degeneration with diffuse uncinate spurring. Mild spinal stenosis. Mild foraminal narrowing bilaterally C7-T1: Negative for stenosis.   IMPRESSION: Multilevel disc  and facet degeneration. Spinal and foraminal stenosis at multiple levels as described above.  MRI brain wo contrast 06/11/2021: Negative for acute infarct.   Mild chronic microvascular ischemic change in the white matter. 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 Question history of trauma.    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   escitalopram (LEXAPRO) 20 MG tablet Take 1 tablet (20 mg total) by mouth daily.   gabapentin (NEURONTIN) 100 MG capsule Take 1 capsule (100 mg total) by mouth 2 (two) times daily.   nystatin-triamcinolone ointment (MYCOLOG) Apply 1 application topically 2 (two)  times daily.   amLODipine (NORVASC) 5 MG tablet Take 1 tablet (5 mg total) by mouth daily. (Patient not taking: No sig reported)   atorvastatin (LIPITOR) 20 MG tablet Take 1 tablet (20 mg total) by mouth daily. (Patient not taking: No sig reported)   hydrOXYzine (ATARAX/VISTARIL) 25 MG tablet Take 1 tablet (25 mg total) by mouth 3 (three) times daily as needed for anxiety. (Patient not taking: No sig reported)   traZODone (DESYREL) 50 MG tablet Take 1 tablet (50 mg total) by mouth at bedtime as needed for sleep. (Patient not taking: No sig reported)   No facility-administered encounter medications on file as of 07/04/2021.    Allergies:  Allergies  Allergen Reactions   Oxycodone Other (See Comments)    abd pain, stomach cramps     Family History: Family History  Problem Relation Age of Onset   Diabetes Father    Hypertension Father    Hypertension Paternal Grandfather    Alcohol abuse Paternal Grandfather    Alcohol abuse Maternal Grandfather    Alcohol abuse Maternal Grandmother    Alcohol abuse Paternal Grandmother    Colon cancer Neg Hx    Rectal cancer Neg Hx    Stomach cancer Neg Hx    Bipolar disorder Neg Hx    Depression Neg Hx     Social History: Social History   Tobacco Use   Smoking status: Never   Smokeless tobacco: Never  Vaping Use   Vaping Use: Never used  Substance Use Topics   Alcohol use: Not Currently    Comment: in recovery   Drug use: Not Currently    Types: Marijuana    Comment: Patient denies    Social History   Social History Narrative   Born and raised in [place], [place] by both parents. He has 2 sisters and is the middle child. He remains close with family. He was married for 16 yrs and seperated in 2005. He has 2 boys who are in their 20's now. He lives alone in [place]. He is working as a Neurosurgeon Signs:  BP (!) 133/93   Pulse 93   Ht '5\' 9"'$  (1.753 m)   Wt 154 lb 12.8 oz (70.2 kg)   SpO2 98%   BMI 22.86 kg/m    Neurological  Exam: MENTAL STATUS including orientation to time, place, person, recent and remote memory, attention span and concentration, language, and fund of knowledge is normal.  Speech is not dysarthric.  CRANIAL NERVES: II:  No visual field defects.    III-IV-VI: Pupils equal round and reactive to light.  Normal conjugate, extra-ocular eye movements in all directions of gaze.  No nystagmus.  No ptosis.   V:  Normal facial sensation.    VII:  Normal facial symmetry and movements.   VIII:  Normal hearing and vestibular function.   IX-X:  Normal palatal movement.   XI:  Normal shoulder shrug and head rotation.   XII:  Normal tongue strength and range of motion, no  deviation or fasciculation.  MOTOR:  No atrophy, fasciculations or abnormal movements.  No pronator drift.   Upper Extremity:  Right  Left  Deltoid  5/5   5/5   Biceps  5/5   5/5   Triceps  5/5   5/5   Infraspinatus 5/5  5/5  Medial pectoralis 5/5  5/5  Wrist extensors  5/5   5/5   Wrist flexors  5/5   5/5   Finger extensors  5/5   5/5   Finger flexors  5/5   5/5   Dorsal interossei  5/5   5/5   Abductor pollicis  5/5   5/5   Tone (Ashworth scale)  0  0   Lower Extremity:  Right  Left  Hip flexors  5/5   5/5   Hip extensors  5/5   5/5   Adductor 5/5  5/5  Abductor 5/5  5/5  Knee flexors  5/5   5/5   Knee extensors  5/5   5/5   Dorsiflexors  5/5   5/5   Plantarflexors  5/5   5/5   Toe extensors  5/5   5/5   Toe flexors  5/5   5/5   Tone (Ashworth scale)  0  0   MSRs:  Right        Left                  brachioradialis 2+  2+  biceps 2+  2+  triceps 2+  2+  patellar 2+  2+  ankle jerk 0  0  Hoffman no  no  plantar response down  down   SENSORY:  Hyperesthesia with vibration, temperature, and pin prick from the mid-calf down into the feet.  Sensation in the right hand is intact.  Romberg's sign absent.   COORDINATION/GAIT: Normal finger-to- nose-finger. Finger tapping is slowed on the right, compared to the  left. Gait narrow based and stable. Tandem and stressed gait intact.    IMPRESSION: Alcohol-induced neuropathy affecting the feet Right hand paresthesias, possible entrapment neuropathy.  NCS/EMG will help localize symptoms.  Cervical spondylosis   PLAN/RECOMMENDATIONS:  Check vitamin B12, folate, and vitamin B1 NCS/EMG of the right arm and leg Increase gabapentin to '300mg'$  at bedtime x 2 week, then '300mg'$  twice daily Counseling on abstaining from alcohol   Return to clinic in 4 months.   Thank you for allowing me to participate in patient's care.  If I can answer any additional questions, I would be pleased to do so.    Sincerely,    Aarion Kittrell K. Posey Pronto, DO

## 2021-07-04 NOTE — Patient Instructions (Addendum)
Check labs  Nerve testing of the right arm and leg  Start gabapentin '300mg'$  at bedtime at bedtime x 2 weeks, then increase to '300mg'$  twice daily  Return to clinic in 4 months

## 2021-07-06 ENCOUNTER — Other Ambulatory Visit: Payer: Self-pay

## 2021-07-06 ENCOUNTER — Ambulatory Visit (INDEPENDENT_AMBULATORY_CARE_PROVIDER_SITE_OTHER): Payer: Self-pay | Admitting: Neurology

## 2021-07-06 DIAGNOSIS — G621 Alcoholic polyneuropathy: Secondary | ICD-10-CM

## 2021-07-06 NOTE — Procedures (Signed)
[facility]  [place] [address], [address]  [place], [place] [address] Tel: [phone] Fax:  [phone] Test Date:  07/06/2021  Patient: [name] [name] DOB: May 08, 1961 Physician: Narda Amber, DO  Sex: Male Height: '5\' 9"'$  Ref Phys: Narda Amber, DO  ID#: [id number]   Technician:    Patient Complaints: This is a 60 year old man referred for evaluation of numbness of the right hand and bilateral legs.  NCV & EMG Findings: Extensive electrodiagnostic testing of the right upper and lower extremity shows:  Right median, ulnar, radial, and mixed palmar sensory responses are within normal limits.  Right sural and superficial peroneal sensory responses are absent. Right median, ulnar, peroneal, and tibial motor responses are within normal limits. Right tibial H reflex study is within normal limits. There is no evidence of active or chronic motor axonal loss changes affecting any of the tested muscles.  Motor unit configuration and recruitment pattern is within normal limits.  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 There is no evidence of carpal tunnel syndrome or cervical/lumbosacral radiculopathy affecting the right side.   ___________________________ Narda Amber, DO    Nerve Conduction Studies Anti Sensory Summary Table   Stim Site NR Peak (ms) Norm Peak (ms) P-T Amp (V) Norm P-T Amp  Right Median Anti Sensory (2nd Digit)  35C  Wrist    3.2 <3.6 15.6 >15  Right Radial Anti Sensory (Base 1st Digit)  35C  Wrist    2.2 <2.7 18.3 >14  Right Sup Peroneal Anti Sensory (Ant Lat Mall)  35C  12 cm NR  <4.6  >4  Right Sural Anti Sensory (Lat Mall)  35C  Calf NR  <4.6  >4  Right Ulnar Anti Sensory (5th Digit)  35C  Wrist    3.0 <3.1 12.4 >10   Motor Summary Table   Stim Site NR Onset (ms) Norm Onset (ms) O-P Amp (mV) Norm O-P Amp Site1 Site2 Delta-0 (ms) Dist (cm) Vel (m/s) Norm  Vel (m/s)  Right Median Motor (Abd Poll Brev)  35C  Wrist    3.2 <4.0 9.5 >6 Elbow Wrist 5.4 29.0 54 >50  Elbow    8.6  9.2         Right Peroneal Motor (Ext Dig Brev)  35C  Ankle    3.4 <6.0 2.6 >2.5 B Fib Ankle 8.1 35.0 43 >40  B Fib    11.5  2.3  Poplt B Fib 1.6 7.0 44 >40  Poplt    13.1  2.2         Right Tibial Motor (Abd Hall Brev)  35C  Ankle    5.1 <6.0 10.2 >4 Knee Ankle 9.4 43.0 46 >40  Knee    14.5  6.9         Right Ulnar Motor (Abd Dig Minimi)  35C  Wrist    2.5 <3.1 10.1 >7 B Elbow Wrist 3.9 23.0 59 >50  B Elbow    6.4  9.3  A Elbow B Elbow 1.9 10.0 53 >50  A Elbow    8.3  8.4          Comparison Summary Table   Stim Site NR Peak (ms) Norm Peak (ms) P-T Amp (V) Site1 Site2 Delta-P (ms) Norm Delta (ms)  Right Median/Ulnar Palm Comparison (Wrist - 8cm)  35C  Median Palm    2.0 <2.2 14.3 Median Palm Ulnar Palm 0.2   Ulnar [employer]  1.8 <2.2 6.8       H Reflex Studies   NR H-Lat (ms) Lat Norm (ms) L-R H-Lat (ms)  Right Tibial (Gastroc)  35C     32.65 <35    EMG   Side Muscle Ins Act Fibs Psw Fasc Number Recrt Dur Dur. Amp Amp. Poly Poly. Comment  Right 1stDorInt Nml Nml Nml Nml Nml Nml Nml Nml Nml Nml Nml Nml N/A  Right AntTibialis Nml Nml Nml Nml Nml Nml Nml Nml Nml Nml Nml Nml N/A  Right Gastroc Nml Nml Nml Nml Nml Nml Nml Nml Nml Nml Nml Nml N/A  Right Flex Dig Long Nml Nml Nml Nml Nml Nml Nml Nml Nml Nml Nml Nml N/A  Right RectFemoris Nml Nml Nml Nml Nml Nml Nml Nml Nml Nml Nml Nml N/A  Right GluteusMed Nml Nml Nml Nml Nml Nml Nml Nml Nml Nml Nml Nml N/A  Right PronatorTeres Nml Nml Nml Nml Nml Nml Nml Nml Nml Nml Nml Nml N/A  Right Biceps Nml Nml Nml Nml Nml Nml Nml Nml Nml Nml Nml Nml N/A  Right Triceps Nml Nml Nml Nml Nml Nml Nml Nml Nml Nml Nml Nml N/A  Right Deltoid Nml Nml Nml Nml Nml Nml Nml Nml Nml Nml Nml Nml N/A      Waveforms:

## 2021-07-11 LAB — VITAMIN B1: Vitamin B1 (Thiamine): 89 nmol/L — ABNORMAL HIGH (ref 8–30)

## 2021-07-21 ENCOUNTER — Telehealth: Payer: Self-pay

## 2021-07-21 NOTE — Telephone Encounter (Signed)
-----   Message from Alda Berthold, DO sent at 07/11/2021 11:43 AM EDT ----- Please inform pt that his labs are normal.  Nerve testing shows neuropathy in the feet.  The nerve testing in the right arm is normal. Let's refer him for out-patient PT for balance therapy. Follow-up in December as scheduled. Thanks.

## 2021-07-21 NOTE — Telephone Encounter (Signed)
Spoke with pt after he had talked to [facility] CMA "pt was very upset he did not get there results he was looking for". 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 Posey Pronto had done. Pt was again read the results and informed after his testing this was the results giving to [place] for him.  Pt stated that he wants a referral out to have another MD test and look at him. Pt advised that if referred out it would be out of the system to see a new MD. Pt stated that he is homeless and has no PCP when advised to have his PCP do the referral for a new MD. Pt was given the number [phone] to medical records so he could get his records. At the end of the call Pt started to say he was sorry for how he was acting and for saying thank you for helping. Pt advised we would send message to Dr Posey Pronto and get back with him when we can,

## 2021-07-21 NOTE — Telephone Encounter (Signed)
Called patient and provided him with results of labs and nerve testing. Also informed patient of recommendations of PT from Dr. Posey Pronto. When results were delivered patient became very upset that his EMG was normal for his right arm. 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 I informed patient that his EMG/NCV that was conducted with Dr. Posey Pronto came back normal and that I am not sure what is wrong with his arm but that I could send a message to Dr. Posey Pronto to get him some answers.   Patient remained very rude in his call and stated that he doesn't understand why he was referred to a specialist to tell him that his arm has no neuropathy and that he was told he had it by a doctor at the ER. I informed patient again that I am not sure about that because the test that was done here in office back back normal for his arm. Patient continued to argue with me about how he did not like what he was being told. I then put patient on hold and Nira Conn continued the call with patient due to him not wanting to hear anything I had to say nor let me send a message to Dr. Posey Pronto for more clarification.

## 2021-07-25 NOTE — Telephone Encounter (Signed)
Best to schedule a follow-up visit to discuss his questions (next available).

## 2021-08-01 ENCOUNTER — Emergency Department (HOSPITAL_COMMUNITY)
Admission: EM | Admit: 2021-08-01 | Discharge: 2021-08-01 | Disposition: A | Discharge status: Home / Self Care | Payer: Self-pay | Attending: Emergency Medicine | Admitting: Emergency Medicine

## 2021-08-01 ENCOUNTER — Other Ambulatory Visit: Payer: Self-pay

## 2021-08-01 ENCOUNTER — Encounter (HOSPITAL_COMMUNITY): Payer: Self-pay | Admitting: Oncology

## 2021-08-01 DIAGNOSIS — F10120 Alcohol abuse with intoxication, uncomplicated: Secondary | ICD-10-CM | POA: Insufficient documentation

## 2021-08-01 DIAGNOSIS — F10920 Alcohol use, unspecified with intoxication, uncomplicated: Secondary | ICD-10-CM

## 2021-08-01 DIAGNOSIS — Y907 Blood alcohol level of 200-239 mg/100 ml: Secondary | ICD-10-CM | POA: Insufficient documentation

## 2021-08-01 DIAGNOSIS — I10 Essential (primary) hypertension: Secondary | ICD-10-CM | POA: Insufficient documentation

## 2021-08-01 HISTORY — DX: Polyneuropathy, unspecified: G62.9

## 2021-08-01 LAB — CBC WITH DIFFERENTIAL/PLATELET
Abs Immature Granulocytes: 0.03 10*3/uL (ref 0.00–0.07)
Basophils Absolute: 0 10*3/uL (ref 0.0–0.1)
Basophils Relative: 1 %
Eosinophils Absolute: 0 10*3/uL (ref 0.0–0.5)
Eosinophils Relative: 1 %
HCT: 40.2 % (ref 39.0–52.0)
Hemoglobin: 13.1 g/dL (ref 13.0–17.0)
Immature Granulocytes: 1 %
Lymphocytes Relative: 18 %
Lymphs Abs: 1 10*3/uL (ref 0.7–4.0)
MCH: 26.2 pg (ref 26.0–34.0)
MCHC: 32.6 g/dL (ref 30.0–36.0)
MCV: 80.4 fL (ref 80.0–100.0)
Monocytes Absolute: 0.5 10*3/uL (ref 0.1–1.0)
Monocytes Relative: 9 %
Neutro Abs: 4.1 10*3/uL (ref 1.7–7.7)
Neutrophils Relative %: 70 %
Platelets: 160 10*3/uL (ref 150–400)
RBC: 5 MIL/uL (ref 4.22–5.81)
RDW: 16.5 % — ABNORMAL HIGH (ref 11.5–15.5)
WBC: 5.8 10*3/uL (ref 4.0–10.5)
nRBC: 0 % (ref 0.0–0.2)

## 2021-08-01 LAB — COMPREHENSIVE METABOLIC PANEL
ALT: 29 U/L (ref 0–44)
AST: 69 U/L — ABNORMAL HIGH (ref 15–41)
Albumin: 4.5 g/dL (ref 3.5–5.0)
Alkaline Phosphatase: 40 U/L (ref 38–126)
Anion gap: 12 (ref 5–15)
BUN: 8 mg/dL (ref 6–20)
CO2: 25 mmol/L (ref 22–32)
Calcium: 9.2 mg/dL (ref 8.9–10.3)
Chloride: 94 mmol/L — ABNORMAL LOW (ref 98–111)
Creatinine, Ser: 0.97 mg/dL (ref 0.61–1.24)
GFR, Estimated: >60 mL/min (ref >60)
Glucose, Bld: 110 mg/dL — ABNORMAL HIGH (ref 70–99)
Potassium: 4 mmol/L (ref 3.5–5.1)
Sodium: 131 mmol/L — ABNORMAL LOW (ref 135–145)
Total Bilirubin: 1.2 mg/dL (ref 0.3–1.2)
Total Protein: 9 g/dL — ABNORMAL HIGH (ref 6.5–8.1)

## 2021-08-01 LAB — ETHANOL: Alcohol, Ethyl (B): 217 mg/dL — ABNORMAL HIGH (ref <10)

## 2021-08-01 LAB — LIPASE, BLOOD: Lipase: 73 U/L — ABNORMAL HIGH (ref 11–51)

## 2021-08-01 LAB — MAGNESIUM: Magnesium: 2.1 mg/dL (ref 1.7–2.4)

## 2021-08-01 MED ORDER — THIAMINE HCL 100 MG PO TABS: 100.0000 mg | ORAL_TABLET | Freq: Every day | ORAL | Status: DC

## 2021-08-01 MED ORDER — FOLIC ACID 1 MG PO TABS
1.0000 mg | ORAL_TABLET | Freq: Every day | ORAL | Status: DC
  Administered 2021-08-01: 1 mg via ORAL
  Filled 2021-08-01: qty 1

## 2021-08-01 MED ORDER — FAMOTIDINE IN NACL 20-0.9 MG/50ML-% IV SOLN
20.0000 mg | Freq: Once | INTRAVENOUS | Status: AC
  Administered 2021-08-01: 20 mg via INTRAVENOUS
  Filled 2021-08-01: qty 50

## 2021-08-01 MED ORDER — SODIUM CHLORIDE 0.9 % IV BOLUS
500.0000 mL | Freq: Once | INTRAVENOUS | Status: AC
Start: 2021-08-01 — End: 2021-08-01
  Administered 2021-08-01: 500 mL via INTRAVENOUS

## 2021-08-01 MED ORDER — LORAZEPAM 2 MG/ML IJ SOLN
1.0000 mg | Freq: Once | INTRAMUSCULAR | Status: AC
  Administered 2021-08-01: 1 mg via INTRAVENOUS
  Filled 2021-08-01: qty 1

## 2021-08-01 MED ORDER — THIAMINE HCL 100 MG/ML IJ SOLN
100.0000 mg | Freq: Every day | INTRAMUSCULAR | Status: DC
  Administered 2021-08-01: 100 mg via INTRAVENOUS
  Filled 2021-08-01: qty 2

## 2021-08-01 MED ORDER — CHLORDIAZEPOXIDE HCL 25 MG PO CAPS
50.0000 mg | ORAL_CAPSULE | Freq: Once | ORAL | Status: DC
  Filled 2021-08-01: qty 2

## 2021-08-01 MED ORDER — ADULT MULTIVITAMIN W/MINERALS CH
1.0000 | ORAL_TABLET | Freq: Every day | ORAL | Status: DC
  Administered 2021-08-01: 1 via ORAL
  Filled 2021-08-01: qty 1

## 2021-08-01 MED ORDER — CHLORDIAZEPOXIDE HCL 25 MG PO CAPS
ORAL_CAPSULE | ORAL | 0 refills | Status: DC
  Filled 2021-08-01: qty 10, 3d supply, fill #0

## 2021-08-01 MED ORDER — HALOPERIDOL LACTATE 5 MG/ML IJ SOLN
1.0000 mg | Freq: Once | INTRAMUSCULAR | Status: AC
  Administered 2021-08-01: 1 mg via INTRAVENOUS
  Filled 2021-08-01: qty 1

## 2021-08-01 NOTE — ED Notes (Signed)
Pt given sandwich and told d/c was imminent.

## 2021-08-01 NOTE — ED Triage Notes (Signed)
Pt c/o abdominal pain that has been present for a few weeks gotten more severe.  Pt reports hx of ETOH abuse has relapsed.  Pt states he cannot keep anything down d/t N/V.

## 2021-08-01 NOTE — ED Notes (Signed)
Pt given gingerale and crackers for PO challenge

## 2021-08-01 NOTE — ED Provider Notes (Signed)
[place] DEPT Provider Note   CSN: [id number] Arrival date & time: 08/01/21  0911     History Chief Complaint  Patient presents with   Abdominal Pain    [name] [name] is a 60 y.o. male with a history of chronic alcohol use present emergency department with abdominal pain.  The patient reports that he had a relapse and was drinking alcohol again in the past several days.  He says he primarily drinks beers.  Reports last drink Sprite prior to arrival in the ED.  He does report a history of withdrawal symptoms when he stops drinking.  He reports he has had persistent vomiting cannot keep down any food or water.  He reports a gnawing and burning epigastric pain that travels towards his back.  He does report a history of pancreatitis in the past.  He denies blood in his vomit.  He denies fevers or chills.  He separately reports to me that he is currently homeless.  He is asking if he can sleep in the ED, and if he can be admitted to the hospital, or if he can be placed in detox.  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    Patient Active Problem List   Diagnosis Date Noted   Essential hypertension 04/07/2021   Prediabetes 04/07/2021   Other hyperlipidemia 04/07/2021   Major depressive disorder, severe ([place]) 03/16/2021   MDD (major depressive disorder), recurrent episode, severe ([place]) 06/30/2017   Lateral epicondylitis of right elbow 01/05/2017   Strain, MCP, hand, right, initial encounter 01/05/2017   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   Bilateral knee pain 01/11/2016   Chronic pain of right wrist 07/07/2015   Carpal tunnel syndrome of right wrist 05/20/2015   Dislocation of right shoulder joint 05/20/2015   Tear of medial meniscus of right knee 03/24/2015   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   Chronic cough 01/15/2015   Onychomycosis of toenail 12/15/2014   Dermatitis of face 11/11/2014   GERD (gastroesophageal reflux disease)    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   Alcohol abuse 01/19/2014   Alcohol dependence ([place]) 09/02/2013   Generalized anxiety disorder 09/02/2013    Past Surgical History:  Procedure Laterality Date   carpel tunnel release Right 07/28/2014    done in [place] ARTHROSCOPY     KNEE SURGERY Left    15 years ago   LIPOMA EXCISION N/A 05/10/2016   Procedure: EXCISION OF SCALP LIPOMA;  Surgeon: Johnathan Hausen, MD;  Location: [employer];  Service: General;  Laterality: N/A;       Family History  Problem Relation Age of Onset   Diabetes Father    Hypertension Father    Hypertension Paternal Grandfather    Alcohol abuse Paternal Grandfather    Alcohol abuse Maternal Grandfather    Alcohol abuse Maternal Grandmother  Alcohol abuse Paternal Grandmother    Colon cancer Neg Hx    Rectal cancer Neg Hx    Stomach cancer Neg Hx    Bipolar disorder Neg Hx    Depression Neg Hx     Social History   Tobacco Use   Smoking status: Never   Smokeless tobacco: Never  Vaping Use   Vaping Use: Never used  Substance Use Topics   Alcohol use: Yes    Comment: in recovery/has relapsed as of 08/01/21   Drug use: Not Currently    Types: Marijuana    Comment: Patient denies     Home Medications Prior to Admission medications   Medication Sig Start Date End Date Taking? Authorizing Provider  chlordiazePOXIDE (LIBRIUM) 25 MG capsule '50mg'$  PO TID x 1D, then 25-'50mg'$  PO BID X 1D, then 25-'50mg'$  PO QD X  1D 08/01/21  Yes Quintasha Gren, Carola Rhine, MD  amLODipine (NORVASC) 5 MG tablet Take 1 tablet (5 mg total) by mouth daily. Patient not taking: No sig reported 03/22/21   Ethelene Hal, NP  atorvastatin (LIPITOR) 20 MG tablet Take 1 tablet (20 mg total) by mouth daily. Patient not taking: No sig reported 03/22/21   Ethelene Hal, NP  citalopram (CELEXA) 20 MG tablet Take 20 mg by mouth daily.    [provider]  escitalopram (LEXAPRO) 20 MG tablet Take 1 tablet (20 mg total) by mouth daily. 04/07/21   Mayers, Cari S, PA-C  gabapentin (NEURONTIN) 300 MG capsule Take 1 tablet at bedtime x 2 weeks, then increase to 1 tablet twice edaily. 07/04/21   Narda Amber K, DO  hydrOXYzine (ATARAX/VISTARIL) 25 MG tablet Take 1 tablet (25 mg total) by mouth 3 (three) times daily as needed for anxiety. Patient not taking: No sig reported 03/22/21   Ethelene Hal, NP  nystatin-triamcinolone ointment [facility]) Apply 1 application topically 2 (two) times daily. 04/07/21   Mayers, Cari S, PA-C  traZODone (DESYREL) 50 MG tablet Take 1 tablet (50 mg total) by mouth at bedtime as needed for sleep. Patient not taking: No sig reported 03/22/21   Ethelene Hal, NP    Allergies    Oxycodone  Review of Systems   Review of Systems  Constitutional:  Negative for chills and fever.  Eyes:  Negative for photophobia and visual disturbance.  Respiratory:  Negative for cough and shortness of breath.   Cardiovascular:  Negative for chest pain and palpitations.  Gastrointestinal:  Positive for abdominal pain, nausea and vomiting.  Musculoskeletal:  Negative for arthralgias and back pain.  Skin:  Negative for color change and rash.  Neurological:  Negative for syncope and light-headedness.  All other systems reviewed and are negative.  Physical Exam Updated Vital Signs BP (!) 130/94 (BP Location: Right Arm)   Pulse (!) 111   Temp 98 F (36.7 C) (Oral)   Resp 20   Ht '5\' 9"'$  (1.753 m)   Wt 65.8  kg   SpO2 98%   BMI 21.41 kg/m   Physical Exam Constitutional:      General: He is not in acute distress. HENT:     Head: Normocephalic and atraumatic.  Eyes:     Conjunctiva/sclera: Conjunctivae normal.     Pupils: Pupils are equal, round, and reactive to light.  Cardiovascular:     Rate and Rhythm: Normal rate and regular rhythm.  Pulmonary:     Effort: Pulmonary effort is normal. No respiratory distress.  Abdominal:     General: There  is no distension.     Tenderness: There is abdominal tenderness in the epigastric area.  Skin:    General: Skin is warm and dry.  Neurological:     General: No focal deficit present.     Mental Status: He is alert. Mental status is at baseline.  Psychiatric:        Mood and Affect: Mood normal.        Behavior: Behavior normal.    ED Results / Procedures / Treatments   Labs (all labs ordered are listed, but only abnormal results are displayed) Labs Reviewed  COMPREHENSIVE METABOLIC PANEL - Abnormal; Notable for the following components:      Result Value   Sodium 131 (*)    Chloride 94 (*)    Glucose, Bld 110 (*)    Total Protein 9.0 (*)    AST 69 (*)    All other components within normal limits  CBC WITH DIFFERENTIAL/PLATELET - Abnormal; Notable for the following components:   RDW 16.5 (*)    All other components within normal limits  LIPASE, BLOOD - Abnormal; Notable for the following components:   Lipase 73 (*)    All other components within normal limits  ETHANOL - Abnormal; Notable for the following components:   Alcohol, Ethyl (B) 217 (*)    All other components within normal limits  MAGNESIUM    EKG None  Radiology No results found.  Procedures Procedures   Medications Ordered in ED Medications  thiamine tablet 100 mg ( Oral See Alternative 08/01/21 1024)    Or  thiamine (B-1) injection 100 mg (100 mg Intravenous Given [id number])  folic acid (FOLVITE) tablet 1 mg (1 mg Oral Given 08/01/21 1025)  multivitamin  with minerals tablet 1 tablet (1 tablet Oral Given 08/01/21 1025)  chlordiazePOXIDE (LIBRIUM) capsule 50 mg (50 mg Oral Not Given 08/01/21 1557)  LORazepam (ATIVAN) injection 1 mg (1 mg Intravenous Given 08/01/21 1023)  sodium chloride 0.9 % bolus 500 mL (0 mLs Intravenous Stopped 08/01/21 1322)  haloperidol lactate (HALDOL) injection 1 mg (1 mg Intravenous Given 08/01/21 1025)  famotidine (PEPCID) IVPB 20 mg premix (0 mg Intravenous Stopped 08/01/21 1130)    ED Course  I have reviewed the triage vital signs and the nursing notes.  Pertinent labs & imaging results that were available during my care of the patient were reviewed by me and considered in my medical decision making (see chart for details).  Differential includes alcoholic gastritis or his pancreatitis was acute biliary disease versus viral gastroenteritis versus other.  On arrival the patient does not appear to demonstrate any signs of acute alcohol withdrawal.  Will check labs, give IV fluids, IV Pepcid, IV Ativan, IV Haldol for dehydration, nausea, gastritis, vomiting.  Clinical Course as of 08/01/21 1752  Mon Aug 01, 2021  1057 Lipase very mildly elevated but within recent levels.  Mild hypoNa consistent with beer potomania.  NS bolus provided [MT]  1422 Patient was able to tolerate p.o. and drink fluids.  His Ortho level is elevated but he was able to ambulate steadily. [MT]  1422 Patient remains comfortable per my reassessment.  His heart rate is 110.  He is not tremulous.  He was able to tolerate food and fluids easily.  I given another dose of Librium here.  I can prescribe some for alcohol withdrawal.  I also provided a list of resources regarding alcohol withdrawal and detox centers [MT]    Clinical Course User Index [MT] Jaelan Rasheed,  Carola Rhine, MD    Final Clinical Impression(s) / ED Diagnoses Final diagnoses:  Alcoholic intoxication without complication [facility])    Rx / DC Orders ED Discharge Orders          Ordered     chlordiazePOXIDE (LIBRIUM) 25 MG capsule        08/01/21 1545             Wyvonnia Dusky, MD 08/01/21 1753

## 2021-08-02 ENCOUNTER — Other Ambulatory Visit: Payer: Self-pay

## 2021-08-09 ENCOUNTER — Other Ambulatory Visit: Payer: Self-pay

## 2021-08-15 ENCOUNTER — Ambulatory Visit: Payer: Self-pay | Admitting: Neurology

## 2021-08-18 ENCOUNTER — Other Ambulatory Visit: Payer: Self-pay

## 2021-08-18 ENCOUNTER — Ambulatory Visit: Payer: Self-pay | Admitting: Physician Assistant

## 2021-08-18 VITALS — BP 125/92 | HR 80 | Temp 98.2°F | Resp 18 | Ht 69.0 in | Wt 156.0 lb

## 2021-08-18 DIAGNOSIS — F322 Major depressive disorder, single episode, severe without psychotic features: Secondary | ICD-10-CM

## 2021-08-18 DIAGNOSIS — R7303 Prediabetes: Secondary | ICD-10-CM

## 2021-08-18 DIAGNOSIS — Z6823 Body mass index (BMI) 23.0-23.9, adult: Secondary | ICD-10-CM

## 2021-08-18 DIAGNOSIS — L309 Dermatitis, unspecified: Secondary | ICD-10-CM

## 2021-08-18 DIAGNOSIS — G8929 Other chronic pain: Secondary | ICD-10-CM

## 2021-08-18 DIAGNOSIS — E7849 Other hyperlipidemia: Secondary | ICD-10-CM

## 2021-08-18 DIAGNOSIS — F411 Generalized anxiety disorder: Secondary | ICD-10-CM

## 2021-08-18 DIAGNOSIS — I10 Essential (primary) hypertension: Secondary | ICD-10-CM

## 2021-08-18 DIAGNOSIS — F1010 Alcohol abuse, uncomplicated: Secondary | ICD-10-CM

## 2021-08-18 DIAGNOSIS — M25562 Pain in left knee: Secondary | ICD-10-CM

## 2021-08-18 DIAGNOSIS — M25561 Pain in right knee: Secondary | ICD-10-CM

## 2021-08-18 LAB — POCT GLYCOSYLATED HEMOGLOBIN (HGB A1C): Hemoglobin A1C: 5.5 % (ref 4.0–5.6)

## 2021-08-18 MED ORDER — HYDROXYZINE HCL 25 MG PO TABS
25.0000 mg | ORAL_TABLET | Freq: Three times a day (TID) | ORAL | 1 refills | Status: DC | PRN
  Filled 2021-08-18: qty 30, 10d supply, fill #0

## 2021-08-18 MED ORDER — GABAPENTIN 300 MG PO CAPS
300.0000 mg | ORAL_CAPSULE | Freq: Two times a day (BID) | ORAL | 5 refills | Status: DC
  Filled 2021-08-18: qty 60, 37d supply, fill #0

## 2021-08-18 MED ORDER — AMLODIPINE BESYLATE 5 MG PO TABS
5.0000 mg | ORAL_TABLET | Freq: Every day | ORAL | 1 refills | Status: DC
Start: 2021-08-18 — End: 2021-10-10
  Filled 2021-08-18: qty 30, 30d supply, fill #0

## 2021-08-18 MED ORDER — ESCITALOPRAM OXALATE 20 MG PO TABS
20.0000 mg | ORAL_TABLET | Freq: Every day | ORAL | 1 refills | Status: DC
  Filled 2021-08-18: qty 30, 30d supply, fill #0

## 2021-08-18 MED ORDER — NYSTATIN-TRIAMCINOLONE 100000-0.1 UNIT/GM-% EX OINT
1.0000 "application " | TOPICAL_OINTMENT | Freq: Two times a day (BID) | CUTANEOUS | 0 refills | Status: DC
  Filled 2021-08-18: qty 30, 15d supply, fill #0

## 2021-08-18 MED ORDER — ATORVASTATIN CALCIUM 20 MG PO TABS
20.0000 mg | ORAL_TABLET | Freq: Every day | ORAL | 1 refills | Status: DC
  Filled 2021-08-18: qty 30, 30d supply, fill #0

## 2021-08-18 MED ORDER — TRAZODONE HCL 50 MG PO TABS
50.0000 mg | ORAL_TABLET | Freq: Every evening | ORAL | 1 refills | Status: DC | PRN
  Filled 2021-08-18: qty 30, 30d supply, fill #0

## 2021-08-18 NOTE — Progress Notes (Signed)
Established Patient Office Visit  Subjective:  Patient ID: [name] [name], male    DOB: September 03, 1961  Age: 60 y.o. MRN: [id number]  CC:  Chief Complaint  Patient presents with   Knee Pain    Bilateral   Medication Refill    HPI [name] [name] states that he has been out of medications for the past 3 weeks with the exception of the gabapentin.  Reports that he did relapse with alcohol use after going through [facility] residential treatment, but states that he has not continued to drink since August 01, 2021.  States that he does plan on returning to [employer].  States that he has continued bilateral knee pain.  Reports that this has been ongoing for many years denies injury or trauma.  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    Past Surgical History:  Procedure Laterality Date   carpel tunnel release Right 07/28/2014    done in [place] ARTHROSCOPY     KNEE SURGERY Left    15 years ago   LIPOMA EXCISION N/A 05/10/2016   Procedure: EXCISION OF SCALP LIPOMA;  Surgeon: Johnathan Hausen, MD;  Location: [place];  Service: General;  Laterality: N/A;    Family History  Problem Relation Age of Onset   Diabetes Father    Hypertension Father    Hypertension Paternal Grandfather    Alcohol abuse Paternal Grandfather    Alcohol abuse Maternal Grandfather    Alcohol abuse Maternal Grandmother    Alcohol abuse Paternal Grandmother    Colon cancer Neg Hx     Rectal cancer Neg Hx    Stomach cancer Neg Hx    Bipolar disorder Neg Hx    Depression Neg Hx     Social History   Socioeconomic History   Marital status: Legally Separated    Spouse name: Not on file   Number of children: 2   Years of education: 13 yrs   Highest education level: Not on file  Occupational History   Occupation: custodian    Comment: [employer]  Tobacco Use   Smoking status: Never   Smokeless tobacco: Never  Vaping Use   Vaping Use: Never used  Substance and Sexual Activity   Alcohol use: Yes    Comment: 1 beer   Drug use: Not Currently    Types: Marijuana    Comment: Patient denies    Sexual activity: Not Currently  Other Topics Concern   Not on file  Social History Narrative   Born and raised in [place], [place] by both parents. He has 2 sisters and is the middle child. He remains close with family. He was married for 16 yrs and seperated in 2005. He has 2 boys who are in their 20's now. He lives alone in [place]. He is working as a Retail buyer  Social Determinants of Health   Financial Resource Strain: Not on file  Food Insecurity: Not on file  Transportation Needs: Not on file  Physical Activity: Not on file  Stress: Not on file  Social Connections: Not on file  Intimate Partner Violence: Not on file    Outpatient Medications Prior to Visit  Medication Sig Dispense Refill   amLODipine (NORVASC) 5 MG tablet Take 1 tablet (5 mg total) by mouth daily. 30 tablet 0   atorvastatin (LIPITOR) 20 MG tablet Take 1 tablet (20 mg total) by mouth daily. 30 tablet 0   citalopram (CELEXA) 20 MG tablet Take 20 mg by mouth daily.     escitalopram (LEXAPRO) 20 MG tablet Take 1 tablet (20 mg total) by mouth daily. 30 tablet 1   gabapentin (NEURONTIN) 300 MG capsule Take 1 tablet at bedtime x 2 weeks, then increase to 1 tablet twice edaily. 60 capsule 5   hydrOXYzine (ATARAX/VISTARIL) 25 MG tablet Take 1 tablet (25 mg total) by mouth 3 (three) times daily as needed for  anxiety. 30 tablet 0   nystatin-triamcinolone ointment (MYCOLOG) Apply 1 application topically 2 (two) times daily. 30 g 0   traZODone (DESYREL) 50 MG tablet Take 1 tablet (50 mg total) by mouth at bedtime as needed for sleep. 30 tablet 0   chlordiazePOXIDE (LIBRIUM) 25 MG capsule Take 2 capsules by mouth 3 times daily for 1 day, then 1-2 caps twice daily for 1 day, then 1-2 caps for once daily for 1 day 10 capsule 0   No facility-administered medications prior to visit.    Allergies  Allergen Reactions   Oxycodone Other (See Comments)    abd pain, stomach cramps     ROS Review of Systems  Constitutional: Negative.   HENT: Negative.    Eyes: Negative.   Respiratory:  Negative for shortness of breath.   Cardiovascular:  Negative for chest pain.  Endocrine: Negative.   Genitourinary: Negative.   Musculoskeletal:  Positive for arthralgias.  Skin: Negative.   Allergic/Immunologic: Negative.   Neurological: Negative.   Hematological: Negative.   Psychiatric/Behavioral:  Positive for dysphoric mood and sleep disturbance. Negative for self-injury and suicidal ideas. The patient is nervous/anxious.      Objective:    Physical Exam Vitals and nursing note reviewed.  Constitutional:      Appearance: Normal appearance.  HENT:     Head: Normocephalic and atraumatic.     Right Ear: External ear normal.     Left Ear: External ear normal.     Nose: Nose normal.     Mouth/Throat:     Mouth: Mucous membranes are moist.     Pharynx: Oropharynx is clear.  Eyes:     Extraocular Movements: Extraocular movements intact.     Conjunctiva/sclera: Conjunctivae normal.     Pupils: Pupils are equal, round, and reactive to light.  Cardiovascular:     Rate and Rhythm: Normal rate and regular rhythm.     Pulses: Normal pulses.     Heart sounds: Normal heart sounds.  Pulmonary:     Effort: Pulmonary effort is normal.     Breath sounds: Normal breath sounds.  Musculoskeletal:        General:  Normal range of motion.     Cervical back: Normal range of motion and neck supple.     Right knee: Bony tenderness and crepitus present. No swelling.     Left knee: Bony tenderness and crepitus present. No swelling.  Skin:  General: Skin is warm and dry.  Neurological:     General: No focal deficit present.     Mental Status: He is alert and oriented to person, place, and time.  Psychiatric:        Attention and Perception: Attention normal.        Mood and Affect: Mood normal.        Speech: Speech normal.        Behavior: Behavior normal.        Thought Content: Thought content does not include homicidal or suicidal ideation.        Cognition and Memory: Cognition and memory normal.        Judgment: Judgment normal.    BP (!) 125/92 (BP Location: Left Arm, Patient Position: Sitting, Cuff Size: Normal)   Pulse 80   Temp 98.2 F (36.8 C) (Oral)   Resp 18   Ht 5\' 9"  (1.753 m)   Wt 156 lb (70.8 kg)   SpO2 100%   BMI 23.04 kg/m  Wt Readings from Last 3 Encounters:  08/18/21 156 lb (70.8 kg)  08/01/21 145 lb (65.8 kg)  07/04/21 154 lb 12.8 oz (70.2 kg)     Health Maintenance Due  Topic Date Due   Zoster Vaccines- Shingrix (1 of 2) Never done   COVID-19 Vaccine (4 - Booster for Pfizer series) 03/30/2021   INFLUENZA VACCINE  06/27/2021    There are no preventive care reminders to display for this patient.  Lab Results  Component Value Date   TSH 1.328 03/16/2021   Lab Results  Component Value Date   WBC 5.8 08/01/2021   HGB 13.1 08/01/2021   HCT 40.2 08/01/2021   MCV 80.4 08/01/2021   PLT 160 08/01/2021   Lab Results  Component Value Date   NA 131 (L) 08/01/2021   K 4.0 08/01/2021   CO2 25 08/01/2021   GLUCOSE 110 (H) 08/01/2021   BUN 8 08/01/2021   CREATININE 0.97 08/01/2021   BILITOT 1.2 08/01/2021   ALKPHOS 40 08/01/2021   AST 69 (H) 08/01/2021   ALT 29 08/01/2021   PROT 9.0 (H) 08/01/2021   ALBUMIN 4.5 08/01/2021   CALCIUM 9.2 08/01/2021    ANIONGAP 12 08/01/2021   Lab Results  Component Value Date   CHOL 357 (H) 03/16/2021   Lab Results  Component Value Date   HDL >135 03/16/2021   Lab Results  Component Value Date   LDLCALC NOT CALCULATED 03/16/2021   Lab Results  Component Value Date   TRIG 57 03/16/2021   Lab Results  Component Value Date   CHOLHDL NOT CALCULATED 03/16/2021   Lab Results  Component Value Date   HGBA1C 5.5 08/18/2021      Assessment & Plan:   Problem List Items Addressed This Visit       Cardiovascular and Mediastinum   Essential hypertension - Primary   Relevant Medications   amLODipine (NORVASC) 5 MG tablet   atorvastatin (LIPITOR) 20 MG tablet     Musculoskeletal and Integument   Dermatitis of face   Relevant Medications   nystatin-triamcinolone ointment (MYCOLOG)     Other   Generalized anxiety disorder (Chronic)   Relevant Medications   escitalopram (LEXAPRO) 20 MG tablet   traZODone (DESYREL) 50 MG tablet   hydrOXYzine (ATARAX/VISTARIL) 25 MG tablet   Alcohol abuse   Bilateral chronic knee pain   Relevant Medications   escitalopram (LEXAPRO) 20 MG tablet   traZODone (DESYREL) 50 MG tablet  gabapentin (NEURONTIN) 300 MG capsule   Other Relevant Orders   DG Knee Complete 4 Views Left   DG Knee Complete 4 Views Right   Major depressive disorder, severe (HCC)   Relevant Medications   escitalopram (LEXAPRO) 20 MG tablet   traZODone (DESYREL) 50 MG tablet   hydrOXYzine (ATARAX/VISTARIL) 25 MG tablet   Prediabetes   Relevant Orders   HgB A1c (Completed)   Other hyperlipidemia   Relevant Medications   amLODipine (NORVASC) 5 MG tablet   atorvastatin (LIPITOR) 20 MG tablet   Other Visit Diagnoses     BMI 23.0-23.9, adult           Meds ordered this encounter  Medications   amLODipine (NORVASC) 5 MG tablet    Sig: Take 1 tablet (5 mg total) by mouth daily.    Dispense:  30 tablet    Refill:  1    Order Specific Question:   Supervising Provider     Answer:   Asencion Noble E [[id number]]   atorvastatin (LIPITOR) 20 MG tablet    Sig: Take 1 tablet (20 mg total) by mouth daily.    Dispense:  30 tablet    Refill:  1    Order Specific Question:   Supervising Provider    Answer:   Asencion Noble E [[id number]]   escitalopram (LEXAPRO) 20 MG tablet    Sig: Take 1 tablet (20 mg total) by mouth daily.    Dispense:  30 tablet    Refill:  1    Order Specific Question:   Supervising Provider    Answer:   Asencion Noble E [[id number]]   traZODone (DESYREL) 50 MG tablet    Sig: Take 1 tablet (50 mg total) by mouth at bedtime as needed for sleep.    Dispense:  30 tablet    Refill:  1    Order Specific Question:   Supervising Provider    Answer:   Asencion Noble E [[id number]]   gabapentin (NEURONTIN) 300 MG capsule    Sig: Take 1 tablet at bedtime x 2 weeks, then increase to 1 tablet twice daily.    Dispense:  60 capsule    Refill:  5    Order Specific Question:   Supervising Provider    Answer:   Asencion Noble E [[id number]]   hydrOXYzine (ATARAX/VISTARIL) 25 MG tablet    Sig: Take 1 tablet (25 mg total) by mouth 3 (three) times daily as needed for anxiety.    Dispense:  30 tablet    Refill:  1    Order Specific Question:   Supervising Provider    Answer:   Elsie Stain [[id number]]   nystatin-triamcinolone ointment (MYCOLOG)    Sig: Apply ointment to affected area two times daily.    Dispense:  30 g    Refill:  0    Order Specific Question:   Supervising Provider    Answer:   WRIGHT, PATRICK E [[id number]]  1. Essential hypertension Resume amlodipine.  Patient encouraged to check blood pressure at home, keep a written log and have available.  Patient has an appointment with primary care provider on September 23, 2021.  Patient has application for [facility] health financial assistance. - amLODipine (NORVASC) 5 MG tablet; Take 1 tablet (5 mg total) by mouth daily.  Dispense: 30 tablet; Refill: 1  2. Prediabetes A1c 5.5. - HgB A1c  3. Generalized anxiety  disorder Resume Lexapro, hydroxyzine. - escitalopram (LEXAPRO) 20 MG tablet; Take 1 tablet (20 mg  total) by mouth daily.  Dispense: 30 tablet; Refill: 1 - hydrOXYzine (ATARAX/VISTARIL) 25 MG tablet; Take 1 tablet (25 mg total) by mouth 3 (three) times daily as needed for anxiety.  Dispense: 30 tablet; Refill: 1  4. Major depressive disorder, severe (HCC) Resume trazodone at bedtime as needed - traZODone (DESYREL) 50 MG tablet; Take 1 tablet (50 mg total) by mouth at bedtime as needed for sleep.  Dispense: 30 tablet; Refill: 1  5. Alcohol abuse Strongly encourage patient to continue [place] meetings, patient declined referral to clinic social worker  6. Other hyperlipidemia Continue current regimen - atorvastatin (LIPITOR) 20 MG tablet; Take 1 tablet (20 mg total) by mouth daily.  Dispense: 30 tablet; Refill: 1  7. Dermatitis of face Continue current regimen - nystatin-triamcinolone ointment (MYCOLOG); Apply ointment to affected area two times daily.  Dispense: 30 g; Refill: 0  8. Bilateral chronic knee pain  IMPRESSION: There is moderate to severe degenerative change of the left knee centered on the medial joint compartment with loss of the joint space. A near bone on bone appearance is observed. There is minimal degenerative change centered on the medial joint compartment of the right knee.     Electronically Signed   By: David  Martinique M.D.   On: 03/21/2016 13:57   Continue current regimen patient education given on supportive care. - gabapentin (NEURONTIN) 300 MG capsule; Take 1 tablet at bedtime x 2 weeks, then increase to 1 tablet twice daily.  Dispense: 60 capsule; Refill: 5 - DG Knee Complete 4 Views Left; Future - DG Knee Complete 4 Views Right; Future   I have reviewed the patient's medical history (PMH, PSH, Social History, Family History, Medications, and allergies) , and have been updated if relevant. I spent 30 minutes reviewing chart and  face to face time with  patient.   Follow-up: Return in about 5 weeks (around 09/23/2021) for [facility].    Loraine Grip Mayers, PA-C

## 2021-08-18 NOTE — Progress Notes (Signed)
Patent reports bilateral pain in knees with increased in the left knee. Patient request refills and needs one time free fill with [facility] pharmacy. Patient has eaten today and patient has not taken medication today.

## 2021-08-18 NOTE — Patient Instructions (Signed)
[facility]    I have ordered x-rays of both of your knees.  We will call you with the results of your x-rays  Please let us know if there is anything else we can do for you  Kennieth Rad, PA-C Physician Assistant [place] [email or web address]   Where to find support  [employer] on Alcoholism and Drug Dependence (NCADD). This group has information about treatment centers and programs for people who have an addiction and for family members. Call: 1-800-NCA-CALL ((919)116-2680). Visit the website: https://www.ncadd.org/ The [facility]stance [facility] and Mental [facility] Services [facility]). This organization will help you find publicly funded treatment centers, help hotlines, and counseling services near you. Call: 1-800-662-HELP 432-197-8498). Visit the website: www.findtreatment.[employer]ameday[employer].com.cy

## 2021-08-19 ENCOUNTER — Ambulatory Visit (HOSPITAL_COMMUNITY)
Admission: RE | Admit: 2021-08-19 | Discharge: 2021-08-19 | Disposition: A | Discharge status: Home / Self Care | Payer: Self-pay | Source: Ambulatory Visit | Attending: Physician Assistant | Admitting: Physician Assistant

## 2021-08-19 ENCOUNTER — Other Ambulatory Visit: Payer: Self-pay

## 2021-08-19 DIAGNOSIS — G8929 Other chronic pain: Secondary | ICD-10-CM | POA: Insufficient documentation

## 2021-08-19 DIAGNOSIS — M25561 Pain in right knee: Secondary | ICD-10-CM | POA: Insufficient documentation

## 2021-08-19 DIAGNOSIS — M25562 Pain in left knee: Secondary | ICD-10-CM | POA: Insufficient documentation

## 2021-08-22 NOTE — Addendum Note (Signed)
Addended by: Kennieth Rad on: 08/22/2021 09:18 AM   Modules accepted: Orders

## 2021-08-24 ENCOUNTER — Ambulatory Visit: Payer: Self-pay

## 2021-08-24 ENCOUNTER — Telehealth: Payer: Self-pay | Admitting: *Deleted

## 2021-08-24 NOTE — Telephone Encounter (Signed)
Patient verified DOB Patient is aware of bilateral arthritis and L knee effusion being noted. Patient is aware of awaiting a phone call from the orthopedics office to schedule evaluation for next step treatment.

## 2021-08-24 NOTE — Telephone Encounter (Signed)
-----   Message from Kennieth Rad, [place] sent at 08/22/2021  9:18 AM EDT ----- Please call patient and let him know that his x-ray did show arthritis in both knees, and a small amount of effusion in his left knee.  referral started for him to be seen by orthopedics for further review.

## 2021-09-12 ENCOUNTER — Ambulatory Visit: Payer: Self-pay | Admitting: Nurse Practitioner

## 2021-09-19 ENCOUNTER — Ambulatory Visit: Payer: Self-pay

## 2021-09-20 ENCOUNTER — Encounter: Payer: Self-pay | Admitting: Orthopedic Surgery

## 2021-09-20 ENCOUNTER — Ambulatory Visit (INDEPENDENT_AMBULATORY_CARE_PROVIDER_SITE_OTHER): Payer: Self-pay | Admitting: Orthopedic Surgery

## 2021-09-20 ENCOUNTER — Other Ambulatory Visit: Payer: Self-pay

## 2021-09-20 DIAGNOSIS — M170 Bilateral primary osteoarthritis of knee: Secondary | ICD-10-CM

## 2021-09-20 NOTE — Progress Notes (Signed)
Office Visit Note   Patient: [name] [name]           Date of Birth: 03-26-1961           MRN: [id number] Visit Date: 09/20/2021              Requested by: Mayers, Loraine Grip, PA-C [id number],  [place] [address] PCP: Gildardo Pounds, NP  Chief Complaint  Patient presents with   Left Knee - Pain   Right Knee - Pain      HPI: Patient is a 60 year old gentleman who is seen for initial evaluation for bilateral knee pain.  Patient states that he has had severe pain in both knees for the past 2 years.  Patient states he has had a previous cortisone injection years ago.  Patient works in [employer] and is on his feet and does a lot of labor on his feet.  Assessment & Plan: Visit Diagnoses:  1. Bilateral primary osteoarthritis of knee     Plan: Both knees were injected he tolerated this well plan to follow-up in 2 months.  Discussed that patient will eventually need bilateral total knee replacements.  Discussed that we can buy time with the steroid injections.  I do not think arthroscopy would be beneficial.  Recommended protein supplements and alcohol cessation prior to surgery.  Follow-Up Instructions: Return in about 2 months (around 11/20/2021).   Ortho Exam  Patient is alert, oriented, no adenopathy, well-dressed, normal affect, normal respiratory effort. Examination patient has an antalgic gait with varus alignment both knees left worse than right.  He does have a mild effusion in both knees with minimal tenderness to palpation over the medial lateral joint line.  Collaterals and cruciates are stable there is crepitation with range of motion.  Patient's most recent hemoglobin is 13 white cell count of 5.8 with a good albumin of 5.5.  Review of the radiographs of the left knee from 1 month ago shows varus alignment with essentially bone-on-bone contact the medial joint line periarticular bony spurs in all 3 compartments with subcondylar cysts and sclerosis.   Patient has an accessory growth plate at the superior lateral aspect of the patella.  2 view radiographs of the right knee shows similar findings with varus alignment periarticular bony spurs in all 3 compartments with subcondylar cysts and sclerosis.  Imaging: No results found. No images are attached to the encounter.  Labs: Lab Results  Component Value Date   HGBA1C 5.5 08/18/2021   HGBA1C 5.9 (H) 03/16/2021   HGBA1C 6.0 10/22/2017   REPTSTATUS 01/27/2016 FINAL 01/26/2016   CULT  11/08/2014    NO GROWTH 5 DAYS Performed at [facility]      Lab Results  Component Value Date   ALBUMIN 4.5 08/01/2021   ALBUMIN 5.1 (H) 06/11/2021   ALBUMIN 4.4 03/16/2021    Lab Results  Component Value Date   MG 2.1 08/01/2021   No results found for: VD25OH  No results found for: PREALBUMIN CBC EXTENDED Latest Ref Rng & Units 08/01/2021 06/11/2021 03/16/2021  WBC 4.0 - 10.5 K/uL 5.8 7.9 6.6  RBC 4.22 - 5.81 MIL/uL 5.00 5.22 5.05  HGB 13.0 - 17.0 g/dL 13.1 13.7 13.5  HCT 39.0 - 52.0 % 40.2 42.2 42.5  PLT 150 - 400 K/uL 160 188 262  NEUTROABS 1.7 - 7.7 K/uL 4.1 4.7 5.0  LYMPHSABS 0.7 - 4.0 K/uL 1.0 2.3 0.8     There is no height or  weight on file to calculate BMI.  Orders:  No orders of the defined types were placed in this encounter.  No orders of the defined types were placed in this encounter.    Procedures: Large Joint Inj: bilateral knee on 09/20/2021 5:08 PM Indications: pain and diagnostic evaluation Details: 22 G 1.5 in needle, anteromedial approach  Arthrogram: No  Outcome: tolerated well, no immediate complications Procedure, treatment alternatives, risks and benefits explained, specific risks discussed. Consent was given by the patient. Immediately prior to procedure a time out was called to verify the correct patient, procedure, equipment, support staff and site/side marked as required. Patient was prepped and draped in the usual sterile fashion.      Clinical Data: No additional findings.  ROS:  All other systems negative, except as noted in the HPI. Review of Systems  Objective: Vital Signs: There were no vitals taken for this visit.  Specialty Comments:  No specialty comments available.  PMFS History: Patient Active Problem List   Diagnosis Date Noted   Essential hypertension 04/07/2021   Prediabetes 04/07/2021   Other hyperlipidemia 04/07/2021   Major depressive disorder, severe ([place]) 03/16/2021   MDD (major depressive disorder), recurrent episode, severe ([place]) 06/30/2017   Lateral epicondylitis of right elbow 01/05/2017   Strain, MCP, hand, right, initial encounter 01/05/2017   Vasomotor rhinitis 02/13/2016   Bilateral chronic knee pain 01/11/2016   Chronic pain of right wrist 07/07/2015   Carpal tunnel syndrome of right wrist 05/20/2015   Dislocation of right shoulder joint 05/20/2015   Tear of medial meniscus of right knee 03/24/2015   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[place]) 01/27/2015   Chronic cough 01/15/2015   Onychomycosis of toenail 12/15/2014   Dermatitis of face 11/11/2014   GERD (gastroesophageal reflux disease)    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   Alcohol abuse 01/19/2014   Alcohol dependence ([place]) 09/02/2013   Generalized anxiety disorder 09/02/2013   Past Medical History:  Diagnosis Date   Alcohol abuse    Anxiety    About 60 years of age   Carpal tunnel syndrome of right wrist Dx 2015   Depression    At 60 years of age   GERD (gastroesophageal reflux disease) Dx 2007   Hepatitis, alcoholic, acute 7680   Hyperlipidemia    borderline, diet controlled   Neuropathy    Pancreatitis Dx 2014   Pneumonia 11/2014   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   Reflux Dx 2007   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    Family History  Problem Relation Age of Onset   Diabetes Father    Hypertension Father    Hypertension Paternal Grandfather     Alcohol abuse Paternal Grandfather    Alcohol abuse Maternal Grandfather    Alcohol abuse Maternal Grandmother    Alcohol abuse Paternal Grandmother    Colon cancer Neg Hx    Rectal cancer Neg Hx    Stomach cancer Neg Hx    Bipolar disorder Neg Hx    Depression Neg Hx     Past Surgical History:  Procedure Laterality Date   carpel tunnel release Right 07/28/2014    done in [place] ARTHROSCOPY     KNEE SURGERY Left    15 years ago   LIPOMA EXCISION N/A 05/10/2016   Procedure: EXCISION OF SCALP LIPOMA;  Surgeon: Johnathan Hausen, MD;  Location: [place];  Service: General;  Laterality: N/A;  Social History   Occupational History   Occupation: custodian    Comment: [employer]  Tobacco Use   Smoking status: Never   Smokeless tobacco: Never  Vaping Use   Vaping Use: Never used  Substance and Sexual Activity   Alcohol use: Yes    Comment: 1 beer   Drug use: Not Currently    Types: Marijuana    Comment: Patient denies    Sexual activity: Not Currently

## 2021-09-23 ENCOUNTER — Ambulatory Visit: Payer: Self-pay | Admitting: Nurse Practitioner

## 2021-09-26 ENCOUNTER — Encounter: Payer: Self-pay | Admitting: Orthopedic Surgery

## 2021-10-10 ENCOUNTER — Ambulatory Visit: Payer: BC Managed Care – PPO | Attending: Nurse Practitioner | Admitting: Nurse Practitioner

## 2021-10-10 ENCOUNTER — Encounter: Payer: Self-pay | Admitting: Nurse Practitioner

## 2021-10-10 ENCOUNTER — Ambulatory Visit: Payer: Self-pay | Admitting: Orthopedic Surgery

## 2021-10-10 ENCOUNTER — Other Ambulatory Visit: Payer: Self-pay

## 2021-10-10 VITALS — BP 100/68 | HR 91 | Ht 69.0 in | Wt 154.0 lb

## 2021-10-10 DIAGNOSIS — I10 Essential (primary) hypertension: Secondary | ICD-10-CM | POA: Diagnosis not present

## 2021-10-10 DIAGNOSIS — F411 Generalized anxiety disorder: Secondary | ICD-10-CM | POA: Diagnosis not present

## 2021-10-10 DIAGNOSIS — E785 Hyperlipidemia, unspecified: Secondary | ICD-10-CM

## 2021-10-10 DIAGNOSIS — G621 Alcoholic polyneuropathy: Secondary | ICD-10-CM

## 2021-10-10 DIAGNOSIS — F322 Major depressive disorder, single episode, severe without psychotic features: Secondary | ICD-10-CM | POA: Diagnosis not present

## 2021-10-10 MED ORDER — BUSPIRONE HCL 10 MG PO TABS
10.0000 mg | ORAL_TABLET | Freq: Two times a day (BID) | ORAL | 2 refills | Status: AC
Start: 2021-10-10 — End: 2021-11-18
  Filled 2021-10-10 – 2021-10-19 (×2): qty 120, 30d supply, fill #0

## 2021-10-10 MED ORDER — ATORVASTATIN CALCIUM 20 MG PO TABS
20.0000 mg | ORAL_TABLET | Freq: Every day | ORAL | 1 refills | Status: DC
  Filled 2021-10-10 – 2021-10-19 (×2): qty 90, 90d supply, fill #0

## 2021-10-10 MED ORDER — GABAPENTIN 600 MG PO TABS
600.0000 mg | ORAL_TABLET | Freq: Three times a day (TID) | ORAL | 2 refills | Status: DC
Start: 2021-10-10 — End: 2021-12-30
  Filled 2021-10-10 – 2021-10-19 (×2): qty 90, 30d supply, fill #0

## 2021-10-10 MED ORDER — TRAZODONE HCL 100 MG PO TABS
100.0000 mg | ORAL_TABLET | Freq: Every evening | ORAL | 1 refills | Status: DC | PRN
  Filled 2021-10-10 – 2021-10-19 (×2): qty 90, 90d supply, fill #0

## 2021-10-10 MED ORDER — ESCITALOPRAM OXALATE 20 MG PO TABS
30.0000 mg | ORAL_TABLET | Freq: Every day | ORAL | 1 refills | Status: DC
  Filled 2021-10-10: qty 45, 30d supply, fill #0
  Filled 2021-10-22: qty 135, 90d supply, fill #0

## 2021-10-10 NOTE — Progress Notes (Addendum)
Assessment & Plan:  [name] [name] was seen today for knee pain and anxiety.  Diagnoses and all orders for this visit:  Essential hypertension Continue all antihypertensives as prescribed.  Remember to bring in your blood pressure log with you for your follow up appointment.  DASH/Mediterranean Diets are healthier choices for HTN.    Dyslipidemia, goal LDL below 100 -     atorvastatin (LIPITOR) 20 MG tablet; Take 1 tablet (20 mg total) by mouth daily.  Generalized anxiety disorder -     escitalopram (LEXAPRO) 20 MG tablet; Take 1.5 tablets (30 mg total) by mouth daily. -     busPIRone (BUSPAR) 10 MG tablet; Take 1-2 tablets (10-20 mg total) by mouth 2 (two) times daily. FOR ANXIETY  Major depressive disorder, severe (HCC) -     traZODone (DESYREL) 100 MG tablet; Take 1 tablet (100 mg total) by mouth at bedtime as needed for sleep.  Alcoholic peripheral neuropathy (HCC) -     gabapentin (NEURONTIN) 600 MG tablet; Take 1 tablet (600 mg total) by mouth 3 (three) times daily.   Patient has been counseled on age-appropriate routine health concerns for screening and prevention. These are reviewed and up-to-date. Referrals have been placed accordingly. Immunizations are up-to-date or declined.    Subjective:   Chief Complaint  Patient presents with   Knee Pain   Anxiety   HPI [name] [name] 60 y.o. male presents to office today for follow up to HTN. 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    HTN Blood pressure is well controlled. He has not been taking amlodipine as prescribed. Will hold for now BP Readings from Last 3 Encounters:  10/10/21 100/68  08/18/21 (!) 125/92  08/01/21  (!) 130/94    Anxiety  He does not feel his celexa 20 mg is working.  Feels more anxious and unable to sleep He has been taking this for many years. Insurance does not cover hydroxyzine so will switch to buspar. He does continue to drink alcohol periodically. Trying to quit. Would like to be referred to psychiatry for anxiety and to also be evaluated for ADD/ADHD. Feels his mind is always racing and he can't seem to ever relax.  Depression screen [facility] 2/9 10/10/2021 08/18/2021 04/07/2021 12/19/2017 11/12/2017  Decreased Interest 1 0 0 0 0  Down, Depressed, Hopeless 0 0 0 0 0  PHQ - 2 Score 1 0 0 0 0  Altered sleeping 2 2 1  0 0  Tired, decreased energy 1 1 0 0 0  Change in appetite 0 0 0 0 2  Feeling bad or failure about yourself  0 1 1 0 0  Trouble concentrating 2 1 1  0 2  Moving slowly or fidgety/restless 1 0 0 0 0  Suicidal thoughts 0 0 0 0 0  PHQ-9 Score 7 5 3  0 4  Difficult doing work/chores Not difficult at all - - - -  Some encounter information is confidential and restricted. Go to Review Flowsheets activity to see all data.  Some recent data might be hidden    Review of Systems  Constitutional:  Negative for fever, malaise/fatigue and weight loss.  HENT: Negative.  Negative for nosebleeds.   Eyes: Negative.  Negative for blurred vision, double vision and  photophobia.  Respiratory: Negative.  Negative for cough and shortness of breath.   Cardiovascular: Negative.  Negative for chest pain, palpitations and leg swelling.  Gastrointestinal: Negative.  Negative for heartburn, nausea and vomiting.  Musculoskeletal: Negative.  Negative for myalgias.  Neurological:  Positive for sensory change. Negative for dizziness, focal weakness, seizures and headaches.  Psychiatric/Behavioral:  Positive for depression. Negative for suicidal ideas. The patient is nervous/anxious and has insomnia.    Past Medical History:  Diagnosis Date   Alcohol abuse    Anxiety    About 60 years of age   Carpal  tunnel syndrome of right wrist Dx 2015   Depression    At 60 years of age   GERD (gastroesophageal reflux disease) Dx 2007   Hepatitis, alcoholic, acute 6283   Hyperlipidemia    borderline, diet controlled   Neuropathy    Pancreatitis Dx 2014   Pneumonia 11/2014   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   Reflux Dx 2007   Sarcoidosis of lung ([place]) Dx 1989    Past Surgical History:  Procedure Laterality Date   carpel tunnel release Right 07/28/2014    done in [place] ARTHROSCOPY     KNEE SURGERY Left    15 years ago   LIPOMA EXCISION N/A 05/10/2016   Procedure: EXCISION OF SCALP LIPOMA;  Surgeon: Johnathan Hausen, MD;  Location: [facility];  Service: General;  Laterality: N/A;    Family History  Problem Relation Age of Onset   Diabetes Father    Hypertension Father    Hypertension Paternal Grandfather    Alcohol abuse Paternal Grandfather    Alcohol abuse Maternal Grandfather    Alcohol abuse Maternal Grandmother    Alcohol abuse Paternal Grandmother    Colon cancer Neg Hx    Rectal cancer Neg Hx    Stomach cancer Neg Hx    Bipolar disorder Neg Hx    Depression Neg Hx     Social History Reviewed with no changes to be made today.   Outpatient Medications Prior to Visit  Medication Sig Dispense Refill   traZODone (DESYREL) 50 MG tablet Take 1 tablet (50 mg total) by mouth at bedtime as needed for sleep. 30 tablet 1   chlordiazePOXIDE (LIBRIUM) 25 MG capsule Take 2 capsules by mouth 3 times daily for 1 day, then 1-2 caps twice daily for 1 day, then 1-2 caps for once daily for 1 day (Patient not taking: Reported on 10/10/2021) 10 capsule 0   amLODipine (NORVASC) 5 MG tablet Take 1 tablet (5 mg total) by mouth daily. (Patient not taking: Reported on 10/10/2021) 30 tablet 1   atorvastatin (LIPITOR) 20 MG tablet Take 1 tablet (20 mg total) by mouth daily. (Patient not taking: Reported on 10/10/2021) 30 tablet 1    escitalopram (LEXAPRO) 20 MG tablet Take 1 tablet (20 mg total) by mouth daily. (Patient not taking: Reported on 10/10/2021) 30 tablet 1   gabapentin (NEURONTIN) 300 MG capsule Take 1 tablet at bedtime x 2 weeks, then increase to 1 tablet twice daily. (Patient not taking: Reported on 10/10/2021) 60 capsule 5   hydrOXYzine (ATARAX/VISTARIL) 25 MG tablet Take 1 tablet (25 mg total) by mouth 3 (three) times daily as needed for anxiety. (Patient not taking: Reported on 10/10/2021) 30 tablet 1   nystatin-triamcinolone ointment (MYCOLOG) Apply ointment to affected area two times daily. (Patient not taking: Reported on 10/10/2021) 30 g 0  No facility-administered medications prior to visit.    Allergies  Allergen Reactions   Oxycodone Other (See Comments)    abd pain, stomach cramps        Objective:    BP 100/68   Pulse 91   Ht 5\' 9"  (1.753 m)   Wt 154 lb (69.9 kg)   SpO2 97%   BMI 22.74 kg/m  Wt Readings from Last 3 Encounters:  10/10/21 154 lb (69.9 kg)  08/18/21 156 lb (70.8 kg)  08/01/21 145 lb (65.8 kg)    Physical Exam Vitals and nursing note reviewed.  Constitutional:      Appearance: He is well-developed.  HENT:     Head: Normocephalic and atraumatic.  Cardiovascular:     Rate and Rhythm: Normal rate and regular rhythm.     Heart sounds: Normal heart sounds. No murmur heard.   No friction rub. No gallop.  Pulmonary:     Effort: Pulmonary effort is normal. No tachypnea or respiratory distress.     Breath sounds: Normal breath sounds. No decreased breath sounds, wheezing, rhonchi or rales.  Chest:     Chest wall: No tenderness.  Abdominal:     General: Bowel sounds are normal.     Palpations: Abdomen is soft.  Musculoskeletal:        General: Normal range of motion.     Cervical back: Normal range of motion.  Skin:    General: Skin is warm and dry.  Neurological:     Mental Status: He is alert and oriented to person, place, and time.     Coordination:  Coordination normal.  Psychiatric:        Behavior: Behavior normal. Behavior is cooperative.        Thought Content: Thought content normal.        Judgment: Judgment normal.         Patient has been counseled extensively about nutrition and exercise as well as the importance of adherence with medications and regular follow-up. The patient was given clear instructions to go to ER or return to medical center if symptoms don't improve, worsen or new problems develop. The patient verbalized understanding.   Follow-up: Return for tele 3 weeks anxiety. See me in 3 months office .   Gildardo Pounds, FNP-BC [facility] [place] [place], [place]   10/10/2021, 10:59 PM

## 2021-10-11 ENCOUNTER — Other Ambulatory Visit: Payer: Self-pay

## 2021-10-12 ENCOUNTER — Other Ambulatory Visit: Payer: Self-pay

## 2021-10-18 ENCOUNTER — Other Ambulatory Visit: Payer: Self-pay

## 2021-10-19 ENCOUNTER — Other Ambulatory Visit: Payer: Self-pay

## 2021-10-22 ENCOUNTER — Other Ambulatory Visit (HOSPITAL_COMMUNITY): Payer: Self-pay

## 2021-10-22 ENCOUNTER — Other Ambulatory Visit: Payer: Self-pay

## 2021-10-24 ENCOUNTER — Other Ambulatory Visit: Payer: Self-pay

## 2021-10-25 ENCOUNTER — Other Ambulatory Visit: Payer: Self-pay

## 2021-10-27 ENCOUNTER — Other Ambulatory Visit: Payer: Self-pay

## 2021-10-31 ENCOUNTER — Other Ambulatory Visit: Payer: Self-pay

## 2021-11-01 ENCOUNTER — Other Ambulatory Visit: Payer: Self-pay

## 2021-11-01 ENCOUNTER — Ambulatory Visit: Payer: BC Managed Care – PPO | Attending: Nurse Practitioner | Admitting: Nurse Practitioner

## 2021-11-01 DIAGNOSIS — R1901 Right upper quadrant abdominal swelling, mass and lump: Secondary | ICD-10-CM

## 2021-11-01 NOTE — Progress Notes (Signed)
Virtual Visit via Telephone Note Due to national recommendations of social distancing due to [place] 19, telehealth visit is felt to be most appropriate for this patient at this time.  I discussed the limitations, risks, security and privacy concerns of performing an evaluation and management service by telephone and the availability of in person appointments. I also discussed with the patient that there may be a patient responsible charge related to this service. The patient expressed understanding and agreed to proceed.    I connected with [name] [name] on 11/01/21  at   9:30 AM EST  EDT by telephone and verified that I am speaking with the correct person using two identifiers.  Location of Patient: Private Residence   Location of Provider: [place] and [place] participating in Telemedicine visit: [name] Rankins FNP-BC [name] [name]    History of Present Illness: Telemedicine visit for: Abdominal mass  Endorses "knot" on the right side of his abdomen. Approximately the size of a golf ball. Has increased in size over the past year. Nontender to palpation.   Past Medical History:  Diagnosis Date   Alcohol abuse    Anxiety    About 60 years of age   Carpal tunnel syndrome of right wrist Dx 2015   Depression    At 60 years of age   GERD (gastroesophageal reflux disease) Dx 2007   Hepatitis, alcoholic, acute 9417   Hyperlipidemia    borderline, diet controlled   Neuropathy    Pancreatitis Dx 2014   Pneumonia 11/2014   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   Reflux Dx 2007   Sarcoidosis of lung ([place]) Dx 1989    Past Surgical History:  Procedure Laterality Date   carpel tunnel release Right 07/28/2014    done in [place] ARTHROSCOPY     KNEE SURGERY Left    15 years ago   LIPOMA EXCISION N/A 05/10/2016   Procedure: EXCISION OF SCALP LIPOMA;  Surgeon: Johnathan Hausen, MD;  Location: [facility];  Service: General;  Laterality: N/A;    Family History  Problem Relation Age of Onset   Diabetes Father    Hypertension Father    Hypertension Paternal Grandfather    Alcohol abuse Paternal Grandfather    Alcohol abuse Maternal Grandfather    Alcohol abuse Maternal Grandmother    Alcohol abuse Paternal Grandmother    Colon cancer Neg Hx    Rectal cancer Neg Hx    Stomach cancer Neg Hx    Bipolar disorder Neg Hx    Depression Neg Hx     Social History   Socioeconomic History   Marital status: Legally Separated    Spouse name: Not on file   Number of children: 2   Years of education: 13 yrs   Highest education level: Not on file  Occupational History   Occupation: custodian    Comment: [employer]  Tobacco Use   Smoking status: Never   Smokeless tobacco: Never  Vaping Use   Vaping Use: Never used  Substance and Sexual Activity   Alcohol use: Yes    Comment: 1 beer   Drug use: Not Currently    Types: Marijuana    Comment: Patient denies    Sexual activity: Not Currently  Other Topics Concern   Not on file  Social History Narrative   Born and raised in [place], [place] by both parents. He has 2  sisters and is the middle child. He remains close with family. He was married for 16 yrs and seperated in 2005. He has 2 boys who are in their 20's now. He lives alone in [place]. 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  HENT: Negative.  Negative for nosebleeds.   Eyes: Negative.  Negative for blurred vision, double vision and photophobia.  Respiratory: Negative.  Negative for cough and shortness of breath.    Cardiovascular: Negative.  Negative for chest pain, palpitations and leg swelling.  Gastrointestinal: Negative.  Negative for heartburn, nausea and vomiting.  Musculoskeletal: Negative.  Negative for myalgias.  Skin:        SEE HPI  Neurological: Negative.  Negative for dizziness, focal weakness, seizures and headaches.  Psychiatric/Behavioral: Negative.  Negative for suicidal ideas.    Assessment and Plan: Diagnoses and all orders for this visit:  Right upper quadrant abdominal mass -     US Abdomen Limited; Future    Follow Up Instructions No follow-ups on file.     I discussed the assessment and treatment plan with the patient. The patient was provided an opportunity to ask questions and all were answered. The patient agreed with the plan and demonstrated an understanding of the instructions.   The patient was advised to call back or seek an in-person evaluation if the symptoms worsen or if the condition fails to improve as anticipated.  I provided 10 minutes of non-face-to-face time during this encounter including median intraservice time, reviewing previous notes, labs, imaging, medications and explaining diagnosis and management.  [name] Pounds, FNP-BC

## 2021-11-03 ENCOUNTER — Encounter: Payer: Self-pay | Admitting: Nurse Practitioner

## 2021-11-03 NOTE — Progress Notes (Signed)
Scheduled appt and called and informed pt about time and place. If Pt has any question I advised him he could call the office.

## 2021-11-04 ENCOUNTER — Encounter: Payer: Self-pay | Admitting: Neurology

## 2021-11-04 ENCOUNTER — Ambulatory Visit (INDEPENDENT_AMBULATORY_CARE_PROVIDER_SITE_OTHER): Payer: BC Managed Care – PPO | Admitting: Neurology

## 2021-11-04 ENCOUNTER — Other Ambulatory Visit: Payer: Self-pay

## 2021-11-04 VITALS — BP 147/93 | HR 69 | Ht 69.0 in | Wt 152.0 lb

## 2021-11-04 DIAGNOSIS — M5417 Radiculopathy, lumbosacral region: Secondary | ICD-10-CM

## 2021-11-04 DIAGNOSIS — G621 Alcoholic polyneuropathy: Secondary | ICD-10-CM

## 2021-11-04 NOTE — Patient Instructions (Addendum)
Start physical therapy for low back strengthening.  Encouraged to abstain from alcohol

## 2021-11-04 NOTE — Progress Notes (Signed)
Follow-up Visit   Date: 11/04/21   [name] [name] MRN: [id number] DOB: 25-Aug-1961   Interim History: [name] [name] is a 60 y.o. right-handed  male with hypertension, anxiety, and alcohol abuse returning to the clinic for follow-up of neuropathy.  The patient was accompanied to the clinic by self.  History of present illness: He has a long history alcohol  \abuse and been through rehab several times.  He is currently in alcohol rehab.  He previously was drinking 24 oz, 2-3 per day x many years.  Starting around June 2022, he began having numbness in the feet, lower legs, and right hand. He has loss of coordination with the right hand, such as with brushing his teeth or other fine motor tasks.  His legs tingle and burn from his mid-calf down into the feet.  Symptoms are constant. He went to the ER for these symptoms where MRI brain and cervical spine did not provide any structural explanation for symptoms so referred here for evaluation.    UPDATE 11/04/2021:  He is here for follow-up visit because of abnormal sensation involving the left foot. He has numbness over the side of the foot as if there is knot, but when he feels the foot, nothing is there. He continues to have burning and tingling in the feet. His gabapentin was increased to 600mg  three times daily.  His balance is good. No weakness.  He endorses low back pain and sometimes shooting pain into the legs. NCS/EMG of the right leg was consistent with sensory neuropathy. He has tried to cut back alcohol down to 3-4 beers nightly.  He is working at [employer] and lives with his parents. NCS did not show abnormalities of the right arm, which he has later found out is due to severe arthritis.  Medications:  Current Outpatient Medications on File Prior to Visit  Medication Sig Dispense Refill   atorvastatin (LIPITOR) 20 MG tablet Take 1 tablet (20 mg total) by mouth daily. 90 tablet 1   busPIRone (BUSPAR) 10 MG tablet Take 1-2 tablets (10-20  mg total) by mouth 2 (two) times daily. FOR ANXIETY 120 tablet 2   escitalopram (LEXAPRO) 20 MG tablet Take 1.5 tablets (30 mg total) by mouth daily. 135 tablet 1   gabapentin (NEURONTIN) 600 MG tablet Take 1 tablet (600 mg total) by mouth 3 (three) times daily. 90 tablet 2   traZODone (DESYREL) 100 MG tablet Take 1 tablet (100 mg total) by mouth at bedtime as needed for sleep. 90 tablet 1   No current facility-administered medications on file prior to visit.    Allergies:  Allergies  Allergen Reactions   Oxycodone Other (See Comments)    abd pain, stomach cramps     Vital Signs:  BP (!) 147/93   Pulse 69   Ht 5\' 9"  (1.753 m)   Wt 152 lb (68.9 kg)   SpO2 98%   BMI 22.45 kg/m    Neurological Exam: MENTAL STATUS including orientation to time, place, person, recent and remote memory, attention span and concentration, language, and fund of knowledge is normal.  Speech is not dysarthric.  CRANIAL NERVES:   Pupils equal round and reactive to light.  Normal conjugate, extra-ocular eye movements in all directions of gaze.  No ptosis.    MOTOR:  Motor strength is 5/5 in all extremities, including toe flexion and extension.  No atrophy, fasciculations or abnormal movements.  No pronator drift.  Tone is normal.    MSRs:  Reflexes  are 2+/4 throughout, except absent at the ankles bilaterally.  SENSORY:  Hyperesthesia with vibration, temperature, and pinprick in the feet. Rhomberg sign is absent.Marland Kitchen  COORDINATION/GAIT:  Normal finger-to- nose-finger.   Gait narrow based and stable. Mild unsteadiness with tandem gait.  Stressed gait intact.  Data: MRI brain wo contrast 06/11/2021: Negative for acute infarct.   Mild chronic microvascular ischemic change in the white matter. 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 Question history of trauma.  MRI cervical spine wo  contrast 06/11/2021: C2-3: Mild disc degeneration. Moderate left foraminal narrowing due to uncinate spurring and facet hypertrophy.   C3-4: Disc degeneration with diffuse uncinate spurring. Mild spinal stenosis and mild foraminal stenosis bilaterally   C4-5: Disc degeneration with diffuse uncinate spurring. Moderate right foraminal stenosis. Cord flattening with mild spinal stenosis.   C5-6: Disc degeneration with diffuse uncinate spurring. Mild spinal stenosis. Mild foraminal narrowing bilaterally   C7-T1: Negative for stenosis.   IMPRESSION: Multilevel disc and facet degeneration. Spinal and foraminal stenosis at multiple levels as described above.  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 There is no evidence of carpal tunnel syndrome or cervical/lumbosacral radiculopathy affecting the right side.     IMPRESSION/PLAN: Alcohol-induced peripheral neuropathy Possible lumbosacral radiculopathy Cervical spondylosis  PLAN/RECOMMENDATIONS:  Continue gabapentin 600mg  BID Start PT for low back strengthening Counseled on abstaining from alcohol to prevent progression of neuropathy He had many questions about management of neuropathy, which were addressed  Return to clinic as needed  Thank you for allowing me to participate in patient's care.  If I can answer any additional questions, I would be pleased to do so.    Sincerely,    Faye Sanfilippo K. Posey Pronto, DO

## 2021-11-07 ENCOUNTER — Ambulatory Visit (HOSPITAL_COMMUNITY)
Admission: RE | Admit: 2021-11-07 | Discharge: 2021-11-07 | Disposition: A | Discharge status: Home / Self Care | Payer: BC Managed Care – PPO | Source: Ambulatory Visit | Attending: Nurse Practitioner | Admitting: Nurse Practitioner

## 2021-11-07 ENCOUNTER — Other Ambulatory Visit: Payer: Self-pay

## 2021-11-07 DIAGNOSIS — R1901 Right upper quadrant abdominal swelling, mass and lump: Secondary | ICD-10-CM

## 2021-11-14 ENCOUNTER — Encounter: Payer: Self-pay | Admitting: Orthopedic Surgery

## 2021-11-14 ENCOUNTER — Ambulatory Visit (INDEPENDENT_AMBULATORY_CARE_PROVIDER_SITE_OTHER): Payer: BC Managed Care – PPO | Admitting: Orthopedic Surgery

## 2021-11-14 DIAGNOSIS — M170 Bilateral primary osteoarthritis of knee: Secondary | ICD-10-CM

## 2021-11-14 NOTE — Progress Notes (Signed)
Office Visit Note   Patient: [name] [name]           Date of Birth: 05/02/1961           MRN: [id number] Visit Date: 11/14/2021              Requested by: Gildardo Pounds, NP [address] [place],  [place] [address] PCP: Gildardo Pounds, NP  Chief Complaint  Patient presents with   Right Knee - Pain, Follow-up   Left Knee - Pain, Follow-up      HPI: Patient is a 60 year old gentleman with osteoarthritis of both knees.  He is about 2 months out from previous knee injections.  He has been working on protein supplements and alcohol cessation prior to surgery.  He states the last injection did not help at all.  Assessment & Plan: Visit Diagnoses:  1. Bilateral primary osteoarthritis of knee     Plan: Patient would like to proceed with total knee arthroplasty in March we will set this up at his convenience.  Anticipate a left total knee arthroplasty 4 months later.  Patient states he currently has a new job and he was given a note stating that he would be out of work for knee replacement surgery.  Follow-Up Instructions: Return if symptoms worsen or fail to improve.   Ortho Exam  Patient is alert, oriented, no adenopathy, well-dressed, normal affect, normal respiratory effort. Examination there is no effusion in either knee no cellulitis.  Collaterals and cruciates are stable he has crepitation with range of motion.  2 view radiographs of both knees show tricompartment arthritic changes mild varus alignment subcondylar sclerosis and cysts.  Imaging: No results found. No images are attached to the encounter.  Labs: Lab Results  Component Value Date   HGBA1C 5.5 08/18/2021   HGBA1C 5.9 (H) 03/16/2021   HGBA1C 6.0 10/22/2017   REPTSTATUS 01/27/2016 FINAL 01/26/2016   CULT  11/08/2014    NO GROWTH 5 DAYS Performed at [facility]      Lab Results  Component Value Date   ALBUMIN 4.5 08/01/2021   ALBUMIN 5.1 (H) 06/11/2021   ALBUMIN 4.4 03/16/2021    Lab  Results  Component Value Date   MG 2.1 08/01/2021   No results found for: VD25OH  No results found for: PREALBUMIN CBC EXTENDED Latest Ref Rng & Units 08/01/2021 06/11/2021 03/16/2021  WBC 4.0 - 10.5 K/uL 5.8 7.9 6.6  RBC 4.22 - 5.81 MIL/uL 5.00 5.22 5.05  HGB 13.0 - 17.0 g/dL 13.1 13.7 13.5  HCT 39.0 - 52.0 % 40.2 42.2 42.5  PLT 150 - 400 K/uL 160 188 262  NEUTROABS 1.7 - 7.7 K/uL 4.1 4.7 5.0  LYMPHSABS 0.7 - 4.0 K/uL 1.0 2.3 0.8     There is no height or weight on file to calculate BMI.  Orders:  No orders of the defined types were placed in this encounter.  No orders of the defined types were placed in this encounter.    Procedures: No procedures performed  Clinical Data: No additional findings.  ROS:  All other systems negative, except as noted in the HPI. Review of Systems  Objective: Vital Signs: There were no vitals taken for this visit.  Specialty Comments:  No specialty comments available.  PMFS History: Patient Active Problem List   Diagnosis Date Noted   Essential hypertension 04/07/2021   Prediabetes 04/07/2021   Other hyperlipidemia 04/07/2021   Major depressive disorder, severe ([facility] [place]) 03/16/2021   MDD (  major depressive disorder), recurrent episode, severe ([place]) 06/30/2017   Lateral epicondylitis of right elbow 01/05/2017   Strain, MCP, hand, right, initial encounter 01/05/2017   Vasomotor rhinitis 02/13/2016   Bilateral chronic knee pain 01/11/2016   Chronic pain of right wrist 07/07/2015   Carpal tunnel syndrome of right wrist 05/20/2015   Dislocation of right shoulder joint 05/20/2015   Tear of medial meniscus of right knee 03/24/2015   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[facility]) 01/27/2015   Chronic cough 01/15/2015   Onychomycosis of toenail 12/15/2014   Dermatitis of face 11/11/2014   GERD (gastroesophageal reflux disease)    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[place]) 01/23/2014   Alcohol abuse 01/19/2014   Alcohol dependence ([place])  09/02/2013   Generalized anxiety disorder 09/02/2013   Past Medical History:  Diagnosis Date   Alcohol abuse    Anxiety    About 60 years of age   Carpal tunnel syndrome of right wrist Dx 2015   Depression    At 60 years of age   GERD (gastroesophageal reflux disease) Dx 2007   Hepatitis, alcoholic, acute 3016   Hyperlipidemia    borderline, diet controlled   Neuropathy    Pancreatitis Dx 2014   Pneumonia 11/2014   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   Reflux Dx 2007   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    Family History  Problem Relation Age of Onset   Diabetes Father    Hypertension Father    Hypertension Paternal Grandfather    Alcohol abuse Paternal Grandfather    Alcohol abuse Maternal Grandfather    Alcohol abuse Maternal Grandmother    Alcohol abuse Paternal Grandmother    Colon cancer Neg Hx    Rectal cancer Neg Hx    Stomach cancer Neg Hx    Bipolar disorder Neg Hx    Depression Neg Hx     Past Surgical History:  Procedure Laterality Date   carpel tunnel release Right 07/28/2014    done in [place] [facility]     KNEE SURGERY Left    15 years ago   LIPOMA EXCISION N/A 05/10/2016   Procedure: EXCISION OF SCALP LIPOMA;  Surgeon: Johnathan Hausen, MD;  Location: [facility];  Service: General;  Laterality: N/A;   Social History   Occupational History   Occupation: custodian    Comment: [employer]  Tobacco Use   Smoking status: Never   Smokeless tobacco: Never  Vaping Use   Vaping Use: Never used  Substance and Sexual Activity   Alcohol use: Yes    Comment: 1 beer   Drug use: Not Currently    Types: Marijuana    Comment: Patient denies    Sexual activity: Not Currently

## 2021-11-24 ENCOUNTER — Ambulatory Visit: Payer: Self-pay

## 2021-11-24 NOTE — Telephone Encounter (Signed)
[facility] agent had pt. To transfer for triage. Pt. Disconnected. Attempted x 2 to reach pt. No answer.

## 2021-12-02 ENCOUNTER — Encounter: Payer: Self-pay | Admitting: Nurse Practitioner

## 2021-12-02 ENCOUNTER — Ambulatory Visit: Payer: BC Managed Care – PPO | Attending: Nurse Practitioner | Admitting: Nurse Practitioner

## 2021-12-02 ENCOUNTER — Other Ambulatory Visit: Payer: Self-pay

## 2021-12-02 VITALS — BP 133/94 | HR 94 | Ht 69.0 in | Wt 154.1 lb

## 2021-12-02 DIAGNOSIS — I10 Essential (primary) hypertension: Secondary | ICD-10-CM | POA: Diagnosis not present

## 2021-12-02 DIAGNOSIS — F32A Depression, unspecified: Secondary | ICD-10-CM

## 2021-12-02 DIAGNOSIS — F419 Anxiety disorder, unspecified: Secondary | ICD-10-CM | POA: Diagnosis not present

## 2021-12-02 DIAGNOSIS — E785 Hyperlipidemia, unspecified: Secondary | ICD-10-CM

## 2021-12-02 NOTE — Progress Notes (Signed)
Assessment & Plan:  [place] was seen today for fatigue.  Diagnoses and all orders for this visit:  Anxiety and depression -     Ambulatory referral to [facility]  Essential hypertension -     CMP14+EGFR -     amLODipine (NORVASC) 5 MG tablet; Take 1 tablet (5 mg total) by mouth daily.  Dyslipidemia, goal LDL below 100 -     Lipid panel    Patient has been counseled on age-appropriate routine health concerns for screening and prevention. These are reviewed and up-to-date. Referrals have been placed accordingly. Immunizations are up-to-date or declined.    Subjective:   Chief Complaint  Patient presents with   Fatigue   HPI [name] [name] 61 y.o. male presents to office today for anxiety and depression. He has a past medical history of Alcohol abuse, Anxiety, Carpal tunnel syndrome of right wrist (Dx 2015), Depression, GERD (gastroesophageal reflux disease) (Dx 2007), Hepatitis, alcoholic, acute (0349), Hyperlipidemia, Neuropathy, Pancreatitis (Dx 2014), Pneumonia (11/2014), Recurrent anterior dislocation of shoulder (05/15/2015), Reflux (Dx 2007), and Sarcoidosis of lung ([facility]) (Dx 1989).   He has not been taking his anti depressant as prescribed. Also continues to consume alcohol. Requesting time off from work due to reported stress, anxiety and depression. I have instructed him that I can only write a letter for him to be out of work for a temporary length of time. Anything long term would need to be recommended by a mental health therapist/psychiatrist.  He was also instructed to refrain from alcohol use and to take his medications for mood disorder.    HTN Blood pressure is not well controlled. He has not been taking amlodipine 5 mg daily as prescribed.  BP Readings from Last 3 Encounters:  12/02/21 (!) 133/94  11/04/21 (!) 147/93  10/10/21 100/68    Review of Systems  Constitutional:  Negative for fever, malaise/fatigue and weight loss.  HENT: Negative.   Negative for nosebleeds.   Eyes: Negative.  Negative for blurred vision, double vision and photophobia.  Respiratory: Negative.  Negative for cough and shortness of breath.   Cardiovascular: Negative.  Negative for chest pain, palpitations and leg swelling.  Gastrointestinal: Negative.  Negative for heartburn, nausea and vomiting.  Musculoskeletal: Negative.  Negative for myalgias.  Neurological: Negative.  Negative for dizziness, focal weakness, seizures and headaches.  Psychiatric/Behavioral:  Positive for depression and substance abuse (ETOH). Negative for suicidal ideas. The patient is nervous/anxious.    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    Past Surgical History:  Procedure Laterality Date   carpel tunnel release Right 07/28/2014    done in [facility] Left    15 years ago   LIPOMA EXCISION N/A 05/10/2016   Procedure: EXCISION OF SCALP LIPOMA;  Surgeon: Johnathan Hausen, MD;  Location: [facility];  Service: General;  Laterality: N/A;    Family History  Problem Relation Age of Onset   Diabetes Father    Hypertension Father    Hypertension Paternal Grandfather    Alcohol abuse Paternal Grandfather    Alcohol abuse Maternal Grandfather    Alcohol abuse Maternal Grandmother    Alcohol abuse Paternal Grandmother    Colon cancer Neg Hx    Rectal cancer Neg Hx    Stomach cancer Neg Hx    Bipolar disorder Neg Hx    Depression Neg Hx     Social History Reviewed with no  changes to be made today.   Outpatient Medications Prior to Visit  Medication Sig Dispense Refill   atorvastatin (LIPITOR) 20 MG tablet Take 1 tablet (20 mg total) by mouth daily. 90 tablet 1   escitalopram (LEXAPRO) 20 MG tablet Take 1.5 tablets (30 mg total) by mouth daily. 135 tablet 1   gabapentin (NEURONTIN) 600 MG tablet Take 1 tablet (600 mg total) by mouth 3 (three) times daily. 90 tablet 2   traZODone (DESYREL) 100 MG tablet Take 1 tablet (100 mg total) by mouth at bedtime as needed for sleep. 90 tablet 1   No facility-administered medications prior to visit.    Allergies  Allergen Reactions   Oxycodone Other (See Comments)    abd pain, stomach cramps        Objective:    BP (!) 133/94    Pulse 94    Ht 5' 9" (1.753 m)    Wt 154 lb 2 oz (69.9 kg)    SpO2 99%    BMI 22.76 kg/m  Wt Readings from Last 3 Encounters:  12/02/21 154 lb 2 oz (69.9 kg)  11/04/21 152 lb (68.9 kg)  10/10/21 154 lb (69.9 kg)    Physical Exam Vitals and nursing note reviewed.  Constitutional:      Appearance: He is well-developed.  HENT:     Head: Normocephalic and atraumatic.  Cardiovascular:     Rate and Rhythm: Normal rate and regular rhythm.     Heart sounds: Normal heart sounds. No murmur heard.   No friction rub. No gallop.  Pulmonary:     Effort: Pulmonary effort is normal. No tachypnea or respiratory distress.     Breath sounds: Normal breath sounds. No decreased breath sounds, wheezing, rhonchi or rales.  Chest:     Chest wall: No tenderness.  Abdominal:     General: Bowel sounds are normal.     Palpations: Abdomen is soft.  Musculoskeletal:        General: Normal range of motion.     Cervical back: Normal range of motion.  Skin:    General: Skin is warm and dry.  Neurological:     Mental Status: He is alert and oriented to person, place, and time.     Coordination: Coordination normal.  Psychiatric:        Behavior: Behavior normal. Behavior is cooperative.         Thought Content: Thought content normal.        Judgment: Judgment normal.         Patient has been counseled extensively about nutrition and exercise as well as the importance of adherence with medications and regular follow-up. The patient was given clear instructions to go to ER or return to medical center if symptoms don't improve, worsen or new problems develop. The patient verbalized understanding.   Follow-up: Return if symptoms worsen or fail to improve.   Vernia Buff  Raul Del, FNP-BC [facility] [place], [place]   12/11/2021, 11:13 PM

## 2021-12-03 LAB — LIPID PANEL
Chol/HDL Ratio: 1.7 ratio (ref 0.0–5.0)
Cholesterol, Total: 295 mg/dL — ABNORMAL HIGH (ref 100–199)
HDL: 170 mg/dL (ref >39)
LDL Chol Calc (NIH): 117 mg/dL — ABNORMAL HIGH (ref 0–99)
Triglycerides: 58 mg/dL (ref 0–149)
VLDL Cholesterol Cal: 8 mg/dL (ref 5–40)

## 2021-12-03 LAB — CMP14+EGFR
ALT: 13 IU/L (ref 0–44)
AST: 23 IU/L (ref 0–40)
Albumin/Globulin Ratio: 1.5 (ref 1.2–2.2)
Albumin: 5.3 g/dL — ABNORMAL HIGH (ref 3.8–4.9)
Alkaline Phosphatase: 56 IU/L (ref 44–121)
BUN/Creatinine Ratio: 7 — ABNORMAL LOW (ref 9–20)
BUN: 8 mg/dL (ref 6–24)
Bilirubin Total: 0.8 mg/dL (ref 0.0–1.2)
CO2: 25 mmol/L (ref 20–29)
Calcium: 9.7 mg/dL (ref 8.7–10.2)
Chloride: 94 mmol/L — ABNORMAL LOW (ref 96–106)
Creatinine, Ser: 1.17 mg/dL (ref 0.76–1.27)
Globulin, Total: 3.6 g/dL (ref 1.5–4.5)
Glucose: 90 mg/dL (ref 70–99)
Potassium: 4.7 mmol/L (ref 3.5–5.2)
Sodium: 135 mmol/L (ref 134–144)
Total Protein: 8.9 g/dL — ABNORMAL HIGH (ref 6.0–8.5)
eGFR: 72 mL/min/{1.73_m2} (ref >59)

## 2021-12-05 ENCOUNTER — Telehealth: Payer: Self-pay

## 2021-12-05 NOTE — Telephone Encounter (Signed)
Called patient reviewed all information and repeated back to me. Will call if any questions.  ? ?

## 2021-12-05 NOTE — Telephone Encounter (Signed)
-----   Message from Gildardo Pounds, NP sent at 12/04/2021 10:10 AM EST ----- Labs are essentially normal. There are some minor variations in your blood work that do not require any additional work up at this time. Cholesterol still elevated. You should be taking your cholesterol medication daily as prescribed.

## 2021-12-06 ENCOUNTER — Telehealth: Payer: Self-pay | Admitting: Orthopedic Surgery

## 2021-12-06 NOTE — Telephone Encounter (Signed)
Pt called stating he spoke with someone who told him he had been scheduled with Dr.Duda for surgery in March. Pt didn't get a name of who he spoke to but would like a CB to verify if he does or doesn't have surgery in march.   [phone]

## 2021-12-08 ENCOUNTER — Encounter: Payer: Self-pay | Admitting: Orthopedic Surgery

## 2021-12-11 ENCOUNTER — Encounter: Payer: Self-pay | Admitting: Nurse Practitioner

## 2021-12-11 MED ORDER — AMLODIPINE BESYLATE 5 MG PO TABS
5.0000 mg | ORAL_TABLET | Freq: Every day | ORAL | 1 refills | Status: DC
  Filled 2021-12-11: qty 90, 90d supply, fill #0

## 2021-12-12 ENCOUNTER — Other Ambulatory Visit: Payer: Self-pay

## 2021-12-19 ENCOUNTER — Other Ambulatory Visit: Payer: Self-pay

## 2021-12-27 ENCOUNTER — Other Ambulatory Visit (HOSPITAL_COMMUNITY)
Admission: EM | Admit: 2021-12-27 | Discharge: 2021-12-31 | Disposition: A | Discharge status: Home / Self Care | Payer: No Payment, Other | Attending: Psychiatry | Admitting: Psychiatry

## 2021-12-27 DIAGNOSIS — E7849 Other hyperlipidemia: Secondary | ICD-10-CM | POA: Diagnosis present

## 2021-12-27 DIAGNOSIS — F1094 Alcohol use, unspecified with alcohol-induced mood disorder: Secondary | ICD-10-CM

## 2021-12-27 DIAGNOSIS — E785 Hyperlipidemia, unspecified: Secondary | ICD-10-CM

## 2021-12-27 DIAGNOSIS — F1024 Alcohol dependence with alcohol-induced mood disorder: Secondary | ICD-10-CM | POA: Diagnosis not present

## 2021-12-27 DIAGNOSIS — F411 Generalized anxiety disorder: Secondary | ICD-10-CM | POA: Diagnosis present

## 2021-12-27 DIAGNOSIS — Z79899 Other long term (current) drug therapy: Secondary | ICD-10-CM | POA: Insufficient documentation

## 2021-12-27 DIAGNOSIS — F331 Major depressive disorder, recurrent, moderate: Secondary | ICD-10-CM | POA: Insufficient documentation

## 2021-12-27 DIAGNOSIS — I10 Essential (primary) hypertension: Secondary | ICD-10-CM

## 2021-12-27 DIAGNOSIS — Z20822 Contact with and (suspected) exposure to covid-19: Secondary | ICD-10-CM | POA: Insufficient documentation

## 2021-12-27 DIAGNOSIS — F1020 Alcohol dependence, uncomplicated: Secondary | ICD-10-CM

## 2021-12-27 DIAGNOSIS — G629 Polyneuropathy, unspecified: Secondary | ICD-10-CM

## 2021-12-27 LAB — POCT URINE DRUG SCREEN - MANUAL ENTRY (I-SCREEN)
POC Amphetamine UR: NOT DETECTED
POC Buprenorphine (BUP): NOT DETECTED
POC Cocaine UR: NOT DETECTED
POC Marijuana UR: NOT DETECTED
POC Methadone UR: NOT DETECTED
POC Methamphetamine UR: NOT DETECTED
POC Morphine: NOT DETECTED
POC Oxazepam (BZO): NOT DETECTED
POC Oxycodone UR: NOT DETECTED
POC Secobarbital (BAR): NOT DETECTED

## 2021-12-27 LAB — RESP PANEL BY RT-PCR (FLU A&B, COVID) ARPGX2
Influenza A by PCR: NEGATIVE
Influenza B by PCR: NEGATIVE
SARS Coronavirus 2 by RT PCR: NEGATIVE

## 2021-12-27 LAB — POC SARS CORONAVIRUS 2 AG -  ED: SARS Coronavirus 2 Ag: NEGATIVE

## 2021-12-27 LAB — CBC WITH DIFFERENTIAL/PLATELET
Abs Immature Granulocytes: 0.02 10*3/uL (ref 0.00–0.07)
Basophils Absolute: 0 10*3/uL (ref 0.0–0.1)
Basophils Relative: 1 %
Eosinophils Absolute: 0 10*3/uL (ref 0.0–0.5)
Eosinophils Relative: 1 %
HCT: 43 % (ref 39.0–52.0)
Hemoglobin: 14.1 g/dL (ref 13.0–17.0)
Immature Granulocytes: 1 %
Lymphocytes Relative: 23 %
Lymphs Abs: 0.9 10*3/uL (ref 0.7–4.0)
MCH: 27 pg (ref 26.0–34.0)
MCHC: 32.8 g/dL (ref 30.0–36.0)
MCV: 82.2 fL (ref 80.0–100.0)
Monocytes Absolute: 0.3 10*3/uL (ref 0.1–1.0)
Monocytes Relative: 9 %
Neutro Abs: 2.5 10*3/uL (ref 1.7–7.7)
Neutrophils Relative %: 65 %
Platelets: 170 10*3/uL (ref 150–400)
RBC: 5.23 MIL/uL (ref 4.22–5.81)
RDW: 15.4 % (ref 11.5–15.5)
WBC: 3.8 10*3/uL — ABNORMAL LOW (ref 4.0–10.5)
nRBC: 0 % (ref 0.0–0.2)

## 2021-12-27 LAB — COMPREHENSIVE METABOLIC PANEL
ALT: 18 U/L (ref 0–44)
AST: 31 U/L (ref 15–41)
Albumin: 4.4 g/dL (ref 3.5–5.0)
Alkaline Phosphatase: 36 U/L — ABNORMAL LOW (ref 38–126)
Anion gap: 11 (ref 5–15)
BUN: <5 mg/dL — ABNORMAL LOW (ref 6–20)
CO2: 27 mmol/L (ref 22–32)
Calcium: 9.1 mg/dL (ref 8.9–10.3)
Chloride: 97 mmol/L — ABNORMAL LOW (ref 98–111)
Creatinine, Ser: 1.02 mg/dL (ref 0.61–1.24)
GFR, Estimated: >60 mL/min (ref >60)
Glucose, Bld: 83 mg/dL (ref 70–99)
Potassium: 3.8 mmol/L (ref 3.5–5.1)
Sodium: 135 mmol/L (ref 135–145)
Total Bilirubin: 0.7 mg/dL (ref 0.3–1.2)
Total Protein: 8.2 g/dL — ABNORMAL HIGH (ref 6.5–8.1)

## 2021-12-27 LAB — URINALYSIS, COMPLETE (UACMP) WITH MICROSCOPIC
Bilirubin Urine: NEGATIVE
Glucose, UA: >=500 mg/dL — AB
Hgb urine dipstick: NEGATIVE
Ketones, ur: NEGATIVE mg/dL
Leukocytes,Ua: NEGATIVE
Nitrite: NEGATIVE
Protein, ur: NEGATIVE mg/dL
Specific Gravity, Urine: 1.02 (ref 1.005–1.030)
Squamous Epithelial / HPF: NONE SEEN (ref 0–5)
pH: 5.5 (ref 5.0–8.0)

## 2021-12-27 LAB — HEMOGLOBIN A1C
Hgb A1c MFr Bld: 5.6 % (ref 4.8–5.6)
Mean Plasma Glucose: 114.02 mg/dL

## 2021-12-27 LAB — HIV ANTIBODY (ROUTINE TESTING W REFLEX): HIV Screen 4th Generation wRfx: NONREACTIVE

## 2021-12-27 LAB — MAGNESIUM: Magnesium: 2.1 mg/dL (ref 1.7–2.4)

## 2021-12-27 LAB — POC SARS CORONAVIRUS 2 AG: SARSCOV2ONAVIRUS 2 AG: NEGATIVE

## 2021-12-27 LAB — TSH: TSH: 0.786 u[IU]/mL (ref 0.350–4.500)

## 2021-12-27 LAB — ETHANOL: Alcohol, Ethyl (B): 133 mg/dL — ABNORMAL HIGH (ref <10)

## 2021-12-27 MED ORDER — MAGNESIUM HYDROXIDE 400 MG/5ML PO SUSP: 30.0000 mL | Freq: Every day | ORAL | Status: DC | PRN

## 2021-12-27 MED ORDER — ACETAMINOPHEN 325 MG PO TABS
650.0000 mg | ORAL_TABLET | Freq: Four times a day (QID) | ORAL | Status: DC | PRN
  Administered 2021-12-30: 650 mg via ORAL
  Filled 2021-12-27: qty 2

## 2021-12-27 MED ORDER — THIAMINE HCL 100 MG/ML IJ SOLN
100.0000 mg | Freq: Once | INTRAMUSCULAR | Status: AC
  Administered 2021-12-27: 100 mg via INTRAMUSCULAR
  Filled 2021-12-27: qty 2

## 2021-12-27 MED ORDER — AMLODIPINE BESYLATE 5 MG PO TABS
5.0000 mg | ORAL_TABLET | Freq: Every day | ORAL | Status: DC
  Administered 2021-12-27 – 2021-12-31 (×5): 5 mg via ORAL
  Filled 2021-12-27 (×3): qty 1
  Filled 2021-12-27: qty 7
  Filled 2021-12-27 (×2): qty 1

## 2021-12-27 MED ORDER — LORAZEPAM 1 MG PO TABS
1.0000 mg | ORAL_TABLET | Freq: Every day | ORAL | Status: AC
  Administered 2021-12-30: 1 mg via ORAL
  Filled 2021-12-27: qty 1

## 2021-12-27 MED ORDER — ATORVASTATIN CALCIUM 10 MG PO TABS
20.0000 mg | ORAL_TABLET | Freq: Every day | ORAL | Status: DC
  Administered 2021-12-27 – 2021-12-31 (×5): 20 mg via ORAL
  Filled 2021-12-27 (×3): qty 2
  Filled 2021-12-27: qty 14
  Filled 2021-12-27 (×2): qty 2

## 2021-12-27 MED ORDER — ESCITALOPRAM OXALATE 10 MG PO TABS
10.0000 mg | ORAL_TABLET | Freq: Every day | ORAL | Status: DC
  Administered 2021-12-27 – 2021-12-28 (×2): 10 mg via ORAL
  Filled 2021-12-27 (×2): qty 1

## 2021-12-27 MED ORDER — ALUM & MAG HYDROXIDE-SIMETH 200-200-20 MG/5ML PO SUSP: 30.0000 mL | ORAL | Status: DC | PRN

## 2021-12-27 MED ORDER — LORAZEPAM 1 MG PO TABS: 1.0000 mg | ORAL_TABLET | Freq: Four times a day (QID) | ORAL | Status: AC | PRN

## 2021-12-27 MED ORDER — LOPERAMIDE HCL 2 MG PO CAPS: 2.0000 mg | ORAL_CAPSULE | ORAL | Status: AC | PRN

## 2021-12-27 MED ORDER — GABAPENTIN 100 MG PO CAPS
100.0000 mg | ORAL_CAPSULE | Freq: Three times a day (TID) | ORAL | Status: DC
  Administered 2021-12-27 – 2021-12-31 (×13): 100 mg via ORAL
  Filled 2021-12-27 (×11): qty 1
  Filled 2021-12-27: qty 21
  Filled 2021-12-27 (×2): qty 1

## 2021-12-27 MED ORDER — THIAMINE HCL 100 MG PO TABS
100.0000 mg | ORAL_TABLET | Freq: Every day | ORAL | Status: DC
  Administered 2021-12-28 – 2021-12-31 (×4): 100 mg via ORAL
  Filled 2021-12-27 (×4): qty 1

## 2021-12-27 MED ORDER — HYDROXYZINE HCL 25 MG PO TABS
25.0000 mg | ORAL_TABLET | Freq: Four times a day (QID) | ORAL | Status: AC | PRN
  Administered 2021-12-29: 25 mg via ORAL
  Filled 2021-12-27: qty 1

## 2021-12-27 MED ORDER — ADULT MULTIVITAMIN W/MINERALS CH
1.0000 | ORAL_TABLET | Freq: Every day | ORAL | Status: DC
  Administered 2021-12-27 – 2021-12-31 (×5): 1 via ORAL
  Filled 2021-12-27: qty 7
  Filled 2021-12-27 (×5): qty 1

## 2021-12-27 MED ORDER — TRAZODONE HCL 50 MG PO TABS
50.0000 mg | ORAL_TABLET | Freq: Every evening | ORAL | Status: DC | PRN
  Administered 2021-12-27 – 2021-12-30 (×4): 50 mg via ORAL
  Filled 2021-12-27: qty 7
  Filled 2021-12-27 (×4): qty 1

## 2021-12-27 MED ORDER — ONDANSETRON 4 MG PO TBDP
4.0000 mg | ORAL_TABLET | Freq: Four times a day (QID) | ORAL | Status: AC | PRN
  Administered 2021-12-28: 4 mg via ORAL
  Filled 2021-12-27: qty 1

## 2021-12-27 MED ORDER — LORAZEPAM 1 MG PO TABS
1.0000 mg | ORAL_TABLET | Freq: Four times a day (QID) | ORAL | Status: AC
  Administered 2021-12-27 (×3): 1 mg via ORAL
  Filled 2021-12-27 (×3): qty 1

## 2021-12-27 MED ORDER — LORAZEPAM 1 MG PO TABS
1.0000 mg | ORAL_TABLET | Freq: Three times a day (TID) | ORAL | Status: AC
  Administered 2021-12-28 (×3): 1 mg via ORAL
  Filled 2021-12-27 (×3): qty 1

## 2021-12-27 MED ORDER — LORAZEPAM 1 MG PO TABS
1.0000 mg | ORAL_TABLET | Freq: Two times a day (BID) | ORAL | Status: AC
  Administered 2021-12-29 (×2): 1 mg via ORAL
  Filled 2021-12-27 (×2): qty 1

## 2021-12-27 NOTE — ED Notes (Signed)
Pt changed his mind he will attend AA meeting

## 2021-12-27 NOTE — BH Assessment (Signed)
Comprehensive Clinical Assessment (CCA) Note  12/27/2021 [name] [name] [id number]  Disposition: Per Thomes Lolling, NP, patient is recommended for [facility].   [place] ED from 12/27/2021 in [facility] ED from 08/01/2021 in [place] DEPT ED from 06/11/2021 in [place] DEPT  C-SSRS RISK CATEGORY No Risk No Risk No Risk      The patient demonstrates the following risk factors for suicide: Chronic risk factors for suicide include: psychiatric disorder of anxiety and substance use disorder. Acute risk factors for suicide include: N/A. Protective factors for this patient include: positive social support, responsibility to others (children, family), and hope for the future. Considering these factors, the overall suicide risk at this point appears to be low. Patient is not appropriate for outpatient follow up.   [name] [name] is a 61 year old male presenting to [facility] voluntarily with chief complaint of alcoholism. Patient states I been battling this thing for years and I feel like I can stop. Patient reports drinking about 3 to 4 24-ounce cans of beer daily for the past month. Patient states that it is affecting his health and his ability to work. 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 Patient reports he wakes up drinking and drinks until he passes out.     Patient reports mental health diagnosis of anxiety and reports that his PCP is prescribing him medications for it. Patient denies having outpatient services and he does not have a history of inpatient treatment. Patient denies use of any other drugs. Patient lives at home with his parents and reports a history of homelessness. Patient has employment, denies legal issues and does not have access to a gun.   Patient is oriented x4, engaged alert and cooperative. Patient eye contact is normal, speech is  circumstantial. Patient affect is appropriate and congruent. Patient denies SI, HI, AVH and reports that he has been drinking this morning.   Chief Complaint:  Chief Complaint  Patient presents with   Addiction Problem   Visit Diagnosis: Alcohol abuse with intoxication     CCA Screening, Triage and Referral (STR)  Patient Reported Information How did you hear about [place]? Self  What Is the Reason for Your Visit/Call Today? Detox  How Long Has This Been Causing You Problems? > than 6 months  What Do You Feel Would Help You the Most Today? Alcohol or Drug Use Treatment   Have You Recently Had Any Thoughts About Hurting Yourself? No  Are You Planning to Commit Suicide/Harm Yourself At This time? No   Have you Recently Had Thoughts About [place]? No  Are You Planning to Harm Someone at This Time? No  Explanation: No data recorded  Have You Used Any Alcohol or Drugs in the Past 24 Hours? Yes  How Long Ago Did You Use Drugs or Alcohol? No data recorded What Did You Use and How Much? beer   Do You Currently Have a Therapist/Psychiatrist? No  Name of Therapist/Psychiatrist: No data recorded  Have You Been Recently Discharged From Any Office Practice or Programs? No  Explanation of Discharge From Practice/Program: No data recorded    CCA Screening Triage Referral Assessment Type of Contact: Face-to-Face  Telemedicine Service Delivery:   Is this Initial or Reassessment? No data recorded Date Telepsych consult ordered in [facility]:  No data recorded Time Telepsych consult ordered in [facility]:  No data recorded Location of Assessment: [facility] Assessment  Services  Provider Location: [facility] Assessment Services   Collateral Involvement: none   Does Patient Have a Stage manager Guardian? No data recorded Name and Contact of Legal Guardian: No data recorded If Minor and Not Living with Parent(s), Who has Custody? No data recorded Is CPS involved or ever been  involved? No data recorded Is APS involved or ever been involved? No data recorded  Patient Determined To Be At Risk for Harm To Self or Others Based on Review of Patient Reported Information or Presenting Complaint? No  Method: No data recorded Availability of Means: No data recorded Intent: No data recorded Notification Required: No data recorded Additional Information for Danger to Others Potential: No data recorded Additional Comments for Danger to Others Potential: No data recorded Are There Guns or Other Weapons in Your Home? No data recorded Types of Guns/Weapons: No data recorded Are These Weapons Safely Secured?                            No data recorded Who Could Verify You Are Able To Have These Secured: No data recorded Do You Have any Outstanding Charges, Pending Court Dates, Parole/Probation? No data recorded Contacted To Inform of Risk of Harm To Self or Others: No data recorded   Does Patient Present under Involuntary Commitment? No  IVC Papers Initial File Date: No data recorded  [place] of Residence: [place]   Patient Currently Receiving the Following Services: Not Receiving Services   Determination of Need: Routine (7 days)   Options For Referral: Medication Management; Outpatient Therapy; Facility-Based Crisis     CCA Biopsychosocial Patient Reported Schizophrenia/Schizoaffective Diagnosis in Past: No   Strengths: Some insight   Mental Health Symptoms Depression:   None   Duration of Depressive symptoms:    Mania:   N/A   Anxiety:    Worrying   Psychosis:   None   Duration of Psychotic symptoms:    Trauma:   N/A   Obsessions:   N/A   Compulsions:   N/A   Inattention:   N/A   Hyperactivity/Impulsivity:   N/A   Oppositional/Defiant Behaviors:   N/A   Emotional Irregularity:   Potentially harmful impulsivity   Other Mood/Personality Symptoms:  No data recorded   Mental Status Exam Appearance and self-care  Stature:    Average   Weight:   Average weight   Clothing:   Casual   Grooming:   Normal   Cosmetic use:   None   Posture/gait:   Normal   Motor activity:   Tremor   Sensorium  Attention:   Normal   Concentration:   Normal   Orientation:   X5   Recall/memory:   Normal   Affect and Mood  Affect:   Full Range   Mood:   Anxious   Relating  Eye contact:   Normal   Facial expression:   Responsive   Attitude toward examiner:   Cooperative   Thought and Language  Speech flow:  Clear and Coherent   Thought content:   Appropriate to Mood and Circumstances   Preoccupation:   None   Hallucinations:   None   Organization:  No data recorded  [employer] of Knowledge:   Average   Intelligence:   Average   Abstraction:   Normal   Judgement:   Fair   Reality Testing:   Adequate   Insight:   Fair   Decision  Making:   Impulsive (As evidenced by alcohol use)   Social Functioning  Social Maturity:   Impulsive   Social Judgement:   Heedless   Stress  Stressors:   Work (Pt is unemployed)   Coping Ability:   Normal   Skill Deficits:   None   Supports:   Friends/Service system; Family (Pt came to [place] to go to [employer])     Religion:    Leisure/Recreation: Leisure / Recreation Do You Have Hobbies?: No  Exercise/Diet: Exercise/Diet Do You Exercise?: No Have You Gained or Lost A Significant Amount of Weight in the Past Six Months?: Yes-Lost Do You Follow a Special Diet?: No Do You Have Any Trouble Sleeping?: Yes   CCA Employment/Education Employment/Work Situation: Employment / Work Situation Employment Situation: Employed Work Stressors: none Patient's Job has Been Impacted by Current Illness: Yes Describe how Patient's Job has Been Impacted: missing work due to addiction Has Patient ever Been in Passenger transport manager?: No  Education: Education Is Patient Currently Attending School?: No   CCA  Family/Childhood History Family and Relationship History: Family history Marital status: Separated Does patient have children?: Yes How many children?: 2  Childhood History:  Childhood History By whom was/is the patient raised?: Both parents Did patient suffer any verbal/emotional/physical/sexual abuse as a child?: No Did patient suffer from severe childhood neglect?: No Has patient ever been sexually abused/assaulted/raped as an adolescent or adult?: No Witnessed domestic violence?: No Has patient been affected by domestic violence as an adult?: No  Child/Adolescent Assessment:     CCA Substance Use Alcohol/Drug Use: Alcohol / Drug Use Pain Medications: Please see MAR Prescriptions: Please see MAR Over the Counter: Please see MAR History of alcohol / drug use?: Yes Longest period of sobriety (when/how long): 1 year Negative Consequences of Use: Work / Youth worker, Museum/gallery curator Withdrawal Symptoms: Tremors, Weakness, Nausea / Vomiting Substance #1 Name of Substance 1: ETOH 1 - Age of First Use: 16 1 - Amount (size/oz): 3 to 4-24 ounce can 1 - Frequency: daily 1 - Duration: ongoing 1 - Last Use / Amount: today/2 cans of beer                       ASAM's:  Six Dimensions of Multidimensional Assessment  Dimension 1:  Acute Intoxication and/or Withdrawal Potential:   Dimension 1:  Description of individual's past and current experiences of substance use and withdrawal: Ongoing use of alcohol; currently withdrawaing  Dimension 2:  Biomedical Conditions and Complications:   Dimension 2:  Description of patient's biomedical conditions and  complications: None indicated  Dimension 3:  Emotional, Behavioral, or Cognitive Conditions and Complications:  Dimension 3:  Description of emotional, behavioral, or cognitive conditions and complications: Pt endorsed suicdial ideation, despondency  Dimension 4:  Readiness to Change:  Dimension 4:  Description of Readiness to Change  criteria: Moderate  Dimension 5:  Relapse, Continued use, or Continued Problem Potential:  Dimension 5:  Relapse, continued use, or continued problem potential critiera description: Significant risk of relapse; Pt has relapsed before  Dimension 6:  Recovery/Living Environment:  Dimension 6:  Recovery/Iiving environment criteria description: Mudlogger Severity Score: ASAM's Severity Rating Score: 11  ASAM Recommended Level of Treatment: ASAM Recommended Level of Treatment: Level II Intensive Outpatient Treatment   Substance use Disorder (SUD) Substance Use Disorder (SUD)  Checklist Symptoms of Substance Use: Evidence of withdrawal (Comment), Persistent desire or unsuccessful efforts to cut down or control use, Presence of craving  or strong urge to use  Recommendations for Services/Supports/Treatments: Recommendations for Services/Supports/Treatments Recommendations For Services/Supports/Treatments: Residential-Level 2, CD-IOP Intensive Chemical Dependency Program, Detox  Discharge Disposition:    DSM5 Diagnoses: Patient Active Problem List   Diagnosis Date Noted   Alcohol dependence with alcohol-induced mood disorder ([place]) 12/27/2021   Essential hypertension 04/07/2021   Prediabetes 04/07/2021   Other hyperlipidemia 04/07/2021   Major depressive disorder, severe ([place]) 03/16/2021   MDD (major depressive disorder), recurrent episode, severe ([place]) 06/30/2017   Lateral epicondylitis of right elbow 01/05/2017   Strain, MCP, hand, right, initial encounter 01/05/2017   Vasomotor rhinitis 02/13/2016   Bilateral chronic knee pain 01/11/2016   Chronic pain of right wrist 07/07/2015   Carpal tunnel syndrome of right wrist 05/20/2015   Dislocation of right shoulder joint 05/20/2015   Tear of medial meniscus of right knee 03/24/2015   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[place]) 01/27/2015   Chronic cough 01/15/2015   Onychomycosis of toenail 12/15/2014   Dermatitis of face 11/11/2014   GERD  (gastroesophageal reflux disease)    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

## 2021-12-27 NOTE — Clinical Social Work Psych Note (Signed)
LCSW Initial Note  LCSW met with [name] [name] for introduction and to begin discussions regarding treatment and potential discharge planning.   [name] [name] presented with a flat affect, congruent mood. He was pleasant and cooperative with this Probation officer. [name] [name] denied having any SI, HI or AVH at this time. [name] [name] denied having any physical complaints at this time.   [name] [name] shared that he came to the [facility] seeking assistance for his ETOH abuse. He endorsed drinking 3-4, 24oz cans of beer daily for the last month. [name] [name] shared that his drinking habits were beginning to effect his health and his ability to work.   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   [name] [name] shared that he wanted to participate in detox services, to ensure he was safe and received treatment in case it became "unbearable".   [name] [name] declined participating in any residential treatment services. He stated "I've pretty much done that multiple times. I don't think it will be helpful at this stage. I know what to do, I just need to do it". LCSW will provide resources for outpatient substance abuse and psychiatric services prior to discharge.   [name] [name] plans to return home with his parents, and will follow up with outpatient resources following his discharge.   LCSW will continue to follow for any additional questions, concerns or identified needs.     Radonna Ricker, MSW, LCSW Clinical Education officer, museum ([place]) [facility]

## 2021-12-27 NOTE — ED Notes (Signed)
Pt in room calm and cooperative. No c/o pain or distress. Will continue to monitor for safety

## 2021-12-27 NOTE — ED Notes (Signed)
Pt came to nurse and requested for medication to help him sleep.

## 2021-12-27 NOTE — ED Notes (Signed)
Patient interacted and communicating in [place] group and gave positive feedback during group. Patient also expressed to staff in wrap up group that he is trying to get adjusted to the unit and do his best to become clean and sober.

## 2021-12-27 NOTE — ED Notes (Signed)
Pt is in the bed sleeping. Respirations are even and unlabored. No acute distress noted. Will continue to monitor for safety. 

## 2021-12-27 NOTE — ED Provider Notes (Addendum)
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  [name] [name], 61 y.o., male patient seen face to face by this provider, consulted with Dr. Serafina Mitchell; and chart reviewed on 12/27/21.  Per chart review patient has a history of alcohol abuse, anxiety, depression, hepatitis C.  Reports he is scheduled to have knee replacement in March 2023.  He has a history of 6-7 substance abuse rehabilitation program admissions.  He has been unable to work.  He is afraid he may lose his job due to missing work.  He is currently living with with his parents and has a history of homelessness.  Reports he has been battling alcohol addiction for years.  The past month and a half he has been drinking 3-4 24 ounce cans of beer daily.  His last drink was this morning.  Reports he wakes up and drinks all day until he passes out and then gets up and does it again.  Reports, "I have got to get help I am 61 years old I cannot keep living like this".  He denies any other substance use.  During evaluation [name] [name] is in sitting position in no acute distress.  He is fairly groomed and makes good eye contact.  He is alert/oriented x4 and cooperative.  He speaking at a moderate pace and tone.  He admits that he is inebriated at this time.  He repeats himself throughout the assessment.  He endorses depression and anxiety mainly due to his circumstances.  Reports a decrease in sleep 4-5 hours per night and a decreased appetite.  He feels like he has lost weight but he is unsure of the amount.  He does not appear to be responding to internal/external stimuli.  He denies AVH.  He denies SI/HI.  He contracts for  safety.  He denies access to firearms/weapons.  Discussed facility base crisis admission with patient.  Explained the milieu and expectations including lab work, COVID testing, and EKG.  Patient is in agreement.  Patient is interested in residential substance abuse treatment.    PHQ 2-9:  [employer] Visit from 12/02/2021 in [place] Office Visit from 10/10/2021 in [place] Office Visit from 08/18/2021 in [place] [facility]  Thoughts that you would be better off dead, or of hurting yourself in some way Not at all Not at all Not at all  PHQ-9 Total Score _0 Flowsheet Row ED from 12/27/2021 in [facility] ED from 08/01/2021 in [place] DEPT ED from 06/11/2021 in [place] DEPT  C-SSRS RISK CATEGORY No Risk No Risk No Risk        Total Time spent with patient: 45 minutes  Musculoskeletal  Strength & Muscle Tone: within normal limits Gait & Station: normal Patient leans: N/A  Psychiatric Specialty Exam  Presentation General Appearance: Appropriate for Environment; Casual  Eye Contact:Good  Speech:Clear and Coherent; Normal Rate  Speech Volume:Normal  Handedness:Right   Mood and Affect  Mood:Depressed; Anxious  Affect:Congruent   Thought Process  Thought Processes:Coherent  Descriptions  of Associations:Intact  Orientation:Full (Time, Place and Person)  Thought Content:Logical  Diagnosis of Schizophrenia or Schizoaffective disorder in past: No   Hallucinations:Hallucinations: None  Ideas of Reference:None  Suicidal Thoughts:Suicidal Thoughts: No  Homicidal Thoughts:Homicidal Thoughts: No   Sensorium  Memory:Immediate Good; Remote Good; Recent Good  Judgment:Good  Insight:Good   Executive Functions  Concentration:Good  Attention Span:Good  [place] of  Knowledge:Good  Language:Good   Psychomotor Activity  Psychomotor Activity:Psychomotor Activity: Normal   Assets  Assets:Communication Skills; Desire for Improvement; Financial Resources/Insurance; Physical Health; Leisure Time   Sleep  Sleep:Sleep: Poor   Nutritional Assessment (For OBS and FBC admissions only) Has the patient had a weight loss or gain of 10 pounds or more in the last 3 months?: No Has the patient had a decrease in food intake/or appetite?: Yes Does the patient have dental problems?: No Does the patient have eating habits or behaviors that may be indicators of an eating disorder including binging or inducing vomiting?: No Has the patient recently lost weight without trying?: 0 Has the patient been eating poorly because of a decreased appetite?: 1 Malnutrition Screening Tool Score: 1    Physical Exam Vitals and nursing note reviewed.  Constitutional:      General: He is not in acute distress.    Appearance: He is well-developed.  HENT:     Head: Normocephalic and atraumatic.  Eyes:     General:        Right eye: No discharge.        Left eye: No discharge.     Conjunctiva/sclera: Conjunctivae normal.  Cardiovascular:     Rate and Rhythm: Normal rate.     Heart sounds: No murmur heard. Pulmonary:     Effort: Pulmonary effort is normal.  Musculoskeletal:        General: Normal range of motion.     Cervical back: Normal range of motion.  Skin:    Coloration: Skin is not jaundiced or pale.  Neurological:     Mental Status: He is alert and oriented to person, place, and time.  Psychiatric:        Attention and Perception: Attention and perception normal.        Mood and Affect: Mood is anxious and depressed.        Speech: Speech normal.        Behavior: Behavior is cooperative.        Thought Content: Thought content normal.        Cognition and Memory: Cognition normal.        Judgment: Judgment is impulsive.   Review of Systems   Constitutional: Negative.   HENT: Negative.    Eyes: Negative.   Respiratory: Negative.    Cardiovascular: Negative.   Musculoskeletal: Negative.   Skin: Negative.   Neurological: Negative.   Psychiatric/Behavioral:  Positive for depression and substance abuse. The patient is nervous/anxious.    Blood pressure (!) 140/104, pulse (!) 103, temperature 98.4 F (36.9 C), resp. rate 20, SpO2 97 %. There is no height or weight on file to calculate BMI.  Past Psychiatric History: Alcohol abuse, anxiety, depression  Is the patient at risk to self? No  Has the patient been a risk to self in the past 6 months? No .    Has the patient been a risk to self within the distant past? No   Is the patient a risk to others? No   Has the patient been a risk to others in  the past 6 months? No   Has the patient been a risk to others within the distant past? 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    Past Surgical History:  Procedure Laterality Date   carpel tunnel release Right 07/28/2014    done in [facility]     KNEE SURGERY Left    15 years ago   LIPOMA EXCISION N/A 05/10/2016   Procedure: EXCISION OF SCALP LIPOMA;  Surgeon: Johnathan Hausen, MD;  Location: [facility];  Service: General;  Laterality: N/A;    Family History:  Family History  Problem Relation Age of Onset   Diabetes Father    Hypertension Father    Hypertension Paternal Grandfather    Alcohol abuse Paternal  Grandfather    Alcohol abuse Maternal Grandfather    Alcohol abuse Maternal Grandmother    Alcohol abuse Paternal Grandmother    Colon cancer Neg Hx    Rectal cancer Neg Hx    Stomach cancer Neg Hx    Bipolar disorder Neg Hx    Depression Neg Hx     Social History:  Social History   Socioeconomic History   Marital status: Legally Separated    Spouse name: Not on file   Number of children: 2   Years of education: 13 yrs   Highest education level: Not on file  Occupational History   Occupation: custodian    Comment: [employer]  Tobacco Use   Smoking status: Never   Smokeless tobacco: Never  Vaping Use   Vaping Use: Never used  Substance and Sexual Activity   Alcohol use: Yes    Comment: 1 beer   Drug use: Not Currently    Types: Marijuana    Comment: Patient denies    Sexual activity: Not Currently  Other Topics Concern   Not on file  Social History Narrative   Born and raised in [place], [place] by both parents. He has 2 sisters and is the middle child. He remains close with family. He was married for 16 yrs and seperated in 2005. He has 2 boys who are in their 20's now. He lives alone in [place]. He is working as a Charity fundraiser handed    Lives in a one story home    Social Determinants of Radio broadcast assistant Strain: Not on file  Food Insecurity: Not on file  Transportation Needs: Not on file  Physical Activity: Not on file  Stress: Not on file  Social Connections: Not on file  Intimate Partner Violence: Not on file    SDOH:  SDOH Screenings   Alcohol Screen: Medium Risk   Last Alcohol Screening Score (AUDIT): 40  Depression (PHQ2-9): Medium Risk   PHQ-2 Score: 15  Financial Resource Strain: Not on file  Food Insecurity: Not on  file  Housing: Not on file  Physical Activity: Not on file  Social Connections: Not on file  Stress: Not on file  Tobacco Use: Low Risk    Smoking Tobacco Use: Never   Smokeless Tobacco Use: Never   Passive  Exposure: Not on file  Transportation Needs: Not on file    Last Labs:  Office Visit on 12/02/2021  Component Date Value Ref Range Status   Glucose 12/02/2021 90  70 - 99 mg/dL Final   BUN 12/02/2021 8  6 - 24 mg/dL Final   Creatinine, Ser 12/02/2021 1.17  0.76 - 1.27 mg/dL Final   eGFR 12/02/2021 72  >59 mL/min/1.73 Final   BUN/Creatinine Ratio 12/02/2021 7 (L)  9 - 20 Final   Sodium 12/02/2021 135  134 - 144 mmol/L Final   Potassium 12/02/2021 4.7  3.5 - 5.2 mmol/L Final   Chloride 12/02/2021 94 (L)  96 - 106 mmol/L Final   CO2 12/02/2021 25  20 - 29 mmol/L Final   Calcium 12/02/2021 9.7  8.7 - 10.2 mg/dL Final   Total Protein 12/02/2021 8.9 (H)  6.0 - 8.5 g/dL Final   Albumin 12/02/2021 5.3 (H)  3.8 - 4.9 g/dL Final   Globulin, Total 12/02/2021 3.6  1.5 - 4.5 g/dL Final   Albumin/Globulin Ratio 12/02/2021 1.5  1.2 - 2.2 Final   Bilirubin Total 12/02/2021 0.8  0.0 - 1.2 mg/dL Final   Alkaline Phosphatase 12/02/2021 56  44 - 121 IU/L Final   AST 12/02/2021 23  0 - 40 IU/L Final   ALT 12/02/2021 13  0 - 44 IU/L Final   Cholesterol, Total 12/02/2021 295 (H)  100 - 199 mg/dL Final   Triglycerides 12/02/2021 58  0 - 149 mg/dL Final   HDL 12/02/2021 170  >39 mg/dL Final   Comment: Results confirmed on dilution.    VLDL Cholesterol Cal 12/02/2021 8  5 - 40 mg/dL Final   LDL Chol Calc (NIH) 12/02/2021 117 (H)  0 - 99 mg/dL Final   Chol/HDL Ratio 12/02/2021 1.7  0.0 - 5.0 ratio Final   Comment:                                   T. Chol/HDL Ratio                                             Men  Women                               1/2 Avg.Risk  3.4    3.3                                   Avg.Risk  5.0    4.4                                2X Avg.Risk  9.6    7.1                                3X Avg.Risk 23.4   11.0  Office Visit on 08/18/2021  Component Date Value Ref Range Status   Hemoglobin A1C 08/18/2021 5.5  4.0 - 5.6 % Final  Admission on 08/01/2021, Discharged on  08/01/2021  Component Date Value Ref Range Status   Sodium 08/01/2021 131 (L)  135 - 145 mmol/L Final   Potassium 08/01/2021 4.0  3.5 - 5.1 mmol/L Final   Chloride 08/01/2021 94 (L)  98 - 111 mmol/L Final   CO2 08/01/2021 25  22 - 32 mmol/L Final   Glucose, Bld 08/01/2021 110 (H)  70 - 99 mg/dL Final   Glucose reference range applies only to samples taken after fasting for at least 8 hours.   BUN 08/01/2021 8  6 - 20 mg/dL Final   Creatinine, Ser 08/01/2021 0.97  0.61 - 1.24 mg/dL Final   Calcium 08/01/2021 9.2  8.9 - 10.3 mg/dL Final   Total Protein 08/01/2021 9.0 (H)  6.5 - 8.1 g/dL Final   Albumin 08/01/2021 4.5  3.5 - 5.0 g/dL Final   AST 08/01/2021 69 (H)  15 - 41 U/L Final   ALT 08/01/2021 29  0 - 44 U/L Final   Alkaline Phosphatase 08/01/2021 40  38 - 126 U/L Final   Total Bilirubin 08/01/2021 1.2  0.3 - 1.2 mg/dL Final   GFR, Estimated 08/01/2021 >60  >60 mL/min Final   Comment: (NOTE) Calculated using the CKD-EPI Creatinine Equation (2021)    Anion gap 08/01/2021 12  5 - 15 Final   Performed at [facility], [place] [address] [place], [place] [address]   WBC 08/01/2021 5.8  4.0 - 10.5 K/uL Final   RBC 08/01/2021 5.00  4.22 - 5.81 MIL/uL Final   Hemoglobin 08/01/2021 13.1  13.0 - 17.0 g/dL Final   HCT 08/01/2021 40.2  39.0 - 52.0 % Final   MCV 08/01/2021 80.4  80.0 - 100.0 fL Final   MCH 08/01/2021 26.2  26.0 - 34.0 pg Final   MCHC 08/01/2021 32.6  30.0 - 36.0 g/dL Final   RDW 08/01/2021 16.5 (H)  11.5 - 15.5 % Final   Platelets 08/01/2021 160  150 - 400 K/uL Final   nRBC 08/01/2021 0.0  0.0 - 0.2 % Final   Neutrophils Relative % 08/01/2021 70  % Final   Neutro Abs 08/01/2021 4.1  1.7 - 7.7 K/uL Final   Lymphocytes Relative 08/01/2021 18  % Final   Lymphs Abs 08/01/2021 1.0  0.7 - 4.0 K/uL Final   Monocytes Relative 08/01/2021 9  % Final   Monocytes Absolute 08/01/2021 0.5  0.1 - 1.0 K/uL Final   Eosinophils Relative 08/01/2021 1  % Final   Eosinophils  Absolute 08/01/2021 0.0  0.0 - 0.5 K/uL Final   Basophils Relative 08/01/2021 1  % Final   Basophils Absolute 08/01/2021 0.0  0.0 - 0.1 K/uL Final   Immature Granulocytes 08/01/2021 1  % Final   Abs Immature Granulocytes 08/01/2021 0.03  0.00 - 0.07 K/uL Final   Performed at [facility], [place] [address] [place], [place] [address]   Lipase 08/01/2021 73 (H)  11 - 51 U/L Final   Performed at [facility], [place] [address] [place], [place] [address]   Alcohol, Ethyl (B) 08/01/2021 217 (H)  <10 mg/dL Final   Comment: (NOTE) Lowest detectable limit for serum alcohol is 10 mg/dL.  For medical purposes only. Performed at [facility], [place] [address] [place], [place] [address]    Magnesium 08/01/2021 2.1  1.7 -  2.4 mg/dL Final   Performed at [facility] [address] [place], [place] [address]  Lab on 07/04/2021  Component Date Value Ref Range Status   Vitamin B1 (Thiamine) 07/04/2021 89 (H)  8 - 30 nmol/L Final   Comment: [facility] Kitchen Vitamin supplementation within 24 hours prior to blood draw may affect the accuracy of the results. . This test was developed and its analytical performance characteristics have been determined by [place], [place]. It has not been cleared or approved by the U.S. Food and Drug Administration. This assay has been validated pursuant to the CLIA regulations and is used for clinical purposes. .    Vitamin B-12 07/04/2021 361  211 - 911 pg/mL Final   Folate 07/04/2021 12.2  >5.9 ng/mL Final    Allergies: Oxycodone  PTA Medications: (Not in a hospital admission)   Medical Decision Making  Patient presents to the [facility] seeking alcohol detox and requesting residential treatment.  He meets criteria for treatment in the [facility].  Educational milieu and expectations including COVID testing, lab work, and EKG.  Patient agreed.    Recommendations  Based on my  evaluation the patient does not appear to have an emergency medical condition.  Disposition: Patient meets criteria for treatment in the [facility].   Lab work ordered: CBC with differential CMP, ethanol, hemoglobin A1c, HIV, magnesium, RPR, TSH, U/A, UDS, GC chlamydia, COVID POC and PCR.  EKG ordered.   CIWA protocol ordered with Ativan taper.   Patient's home medications restarted: Lexapro 10 mg daily (originally prescribed at 20 mg restarted at lower dose due to patient noncompliance).  Lipitor 20 mg daily, gabapentin 100 mg 3 times daily (originally prescribed 600 mg 3 times daily restarted at lower dose due to noncompliance) trazodone restarted at 50 mg nightly as needed.  Amlodipine 5 mg daily for HTN.    Revonda Humphrey, NP 12/27/21  11:57 AM

## 2021-12-27 NOTE — Progress Notes (Signed)
Patient informed by RN he would be admitted to the facility based crisis unit.  Covid testing completed, patient asked for water, visibly anxious shaking of legs and rubbing hands together. Ativan 1 mg given PO for anxiety. Patient being admitted due to wanting alcohol detox.

## 2021-12-27 NOTE — Progress Notes (Signed)
Received and escorted [name] [name] from the OBS area, he was cooperative with the admission process and was oriented to his new environment. He endorsed feeling anxious only at this time. He was allowed to make a list of his phone numbers prior to his arrival to the unit.

## 2021-12-27 NOTE — BH Assessment (Signed)
[name] [name] is routine. Pt looking to detox from ETOH. Denies SI, HI, AVH.

## 2021-12-27 NOTE — ED Notes (Signed)
Refused to attend AA meeting 

## 2021-12-28 LAB — RPR: RPR Ser Ql: NONREACTIVE

## 2021-12-28 MED ORDER — ESCITALOPRAM OXALATE 20 MG PO TABS
20.0000 mg | ORAL_TABLET | Freq: Every day | ORAL | Status: DC
  Administered 2021-12-29 – 2021-12-30 (×2): 20 mg via ORAL
  Filled 2021-12-28: qty 7
  Filled 2021-12-28 (×2): qty 2

## 2021-12-28 MED ORDER — ESCITALOPRAM OXALATE 10 MG PO TABS
10.0000 mg | ORAL_TABLET | Freq: Once | ORAL | Status: AC
  Administered 2021-12-28: 10 mg via ORAL
  Filled 2021-12-28: qty 1

## 2021-12-28 NOTE — ED Notes (Signed)
Pt in dayroom calm and cooperative. No c/o pain or distress. Pt watching television. Will continue to monitor for safety

## 2021-12-28 NOTE — ED Notes (Signed)
Pt is in the bed sleeping. Respirations are even and unlabored. No acute distress noted. Will continue to monitor for safety. 

## 2021-12-28 NOTE — Clinical Social Work Psych Note (Signed)
Depression  Diagnosis: Depression (Psychoeducational)  Date: 12/28/21  Type of Therapy/Therapeutic Modalities: Group Discussion, Psycho-Education; CBT; Motivational Interviewing   Participation Level: Active  Objective: The purpose of this group is to educate patients on the various components of depression and how it can influence and/or contribute to severe symptoms that affect how you feel, think, and handle daily activities, such as sleeping, eating, or working.  Therapeutic Goals:  Patient will learn the foundations of depression, including its definition and the various types of depression disorders.  Patient will discuss with group members their personal experiences with depression and how it has impacted their lives Patient will learn various cognitive behavioral therapy (CBT) techniques that are effective in treating anxiety symptoms. Patient will discuss with group members how they plan to address and/or maintain their depressive symptoms moving forward.   Summary of Patients Progress:  [name] [name] was engaged throughout and contributed to the group's discussion. [name] [name] reviewed the therapeutic goals and objective listed above and completed a worksheet challenging him to identify protective factors for managing his depressive symptoms.

## 2021-12-28 NOTE — ED Notes (Signed)
Pt sleeping@this time. Breathing even and unlabored. Will continue to monitor for safety 

## 2021-12-28 NOTE — ED Notes (Signed)
Pt came to group and was given a handout

## 2021-12-28 NOTE — ED Provider Notes (Signed)
Behavioral Health Progress Note  Date and Time: 12/28/2021 1:33 PM Name: [name] [name] MRN:  [id number]  Subjective:   61 year old male with history of alcohol use, depression and anxiety who presented to the [facility] on 12/27/2021 for alcohol detox.  EtOH 133; UDS negative. Patient was admitted to the [facility] for continued detox and criss stabilization.   Patient has been seen and chart reviewed-has been medication compliant and appropriate with staff and peers on the unit.  Patient observed participating appropriately in group this morning.  Patient interviewed this afternoon.  He recounts about what brought him into the [facility]ehive as per H&P.  Patient describes his mood as "better than I was yesterday".  No SI/HI/AVH.  When asked what is better about today patient states, "I did not wake up drinking".  Patient reports experiencing some nausea this morning and states that he vomited shortly after breakfast this morning.  Patient states he was able to tolerate lunch without issue.  He reports alcohol withdrawal symptoms of tremors, nausea, anxiety.  Patient states that he first started drinking when he was approximately 61 years old describes using alcohol as a means to self medicate his anxiety.  Patient states he has seen a psychiatrist in the past and his psychiatrist currently has him on 10 mg of Lexapro. Patient states he has also tried BuSpar in the past but that this was discontinued but states that it was helpful.  Patient also reports trying Zoloft.  Patient describes having panic attacks and describes using coping skills to assist with this; however, states that his elevated anxiety often remains.  Patient expresses interest in medication adjustments.  Discussed increasing Lexapro to 20 mg versus adding BuSpar (as patient found it helpful in the past).  Patient states that he would prefer to increase Lexapro as he would rather have a once daily medication rather than taking something multiple times a day.   Discussed Ativan taper with patient and informed him that taper scheduled to end on Friday morning.  Patient verbalizes understanding and is in agreement at this time to stay at the [name] for completion of taper. Patient expresses interest in outpatient substance use treatment at discharge and declines residential rehab.   No other concerns or questions at this time.  Encouraged patient to attend groups and to notify staff of any issues.   Patient was given the opportunity to ask questions and  All questions answered. Patient verbalized understanding regarding plan of care.   Diagnosis:  Final diagnoses:  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HCC)  Generalized anxiety disorder  Moderate episode of recurrent major depressive disorder (HCC)  Alcohol use disorder, moderate, in controlled environment ([place])    Total Time spent with patient: 30 minutes  Past Psychiatric History: alcohol use, anxiety, depression Past Medical History:  Past Medical History:  Diagnosis Date   Alcohol abuse    Anxiety    About 61 years of age   Carpal tunnel syndrome of right wrist Dx 2015   Depression    At 61 years of age   GERD (gastroesophageal reflux disease) Dx 2007   Hepatitis, alcoholic, acute 7622   Hyperlipidemia    borderline, diet controlled   Neuropathy    Pancreatitis Dx 2014   Pneumonia 11/2014   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   Reflux Dx 2007   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    Past Surgical History:  Procedure Laterality Date   carpel tunnel release Right 07/28/2014  done in [place] ARTHROSCOPY     KNEE SURGERY Left    15 years ago   LIPOMA EXCISION N/A 05/10/2016   Procedure: EXCISION OF SCALP LIPOMA;  Surgeon: Johnathan Hausen, MD;  Location: [facility];  Service: General;  Laterality: N/A;   Family History:  Family History  Problem Relation Age of Onset   Diabetes Father    Hypertension  Father    Hypertension Paternal Grandfather    Alcohol abuse Paternal Grandfather    Alcohol abuse Maternal Grandfather    Alcohol abuse Maternal Grandmother    Alcohol abuse Paternal Grandmother    Colon cancer Neg Hx    Rectal cancer Neg Hx    Stomach cancer Neg Hx    Bipolar disorder Neg Hx    Depression Neg Hx    Family Psychiatric  History: alcohol use in maternal and paternal grandparents Social History:  Social History   Substance and Sexual Activity  Alcohol Use Yes   Comment: 1 beer     Social History   Substance and Sexual Activity  Drug Use Not Currently   Types: Marijuana   Comment: Patient denies     Social History   Socioeconomic History   Marital status: Legally Separated    Spouse name: Not on file   Number of children: 2   Years of education: 13 yrs   Highest education level: Not on file  Occupational History   Occupation: custodian    Comment: [employer]  Tobacco Use   Smoking status: Never   Smokeless tobacco: Never  Vaping Use   Vaping Use: Never used  Substance and Sexual Activity   Alcohol use: Yes    Comment: 1 beer   Drug use: Not Currently    Types: Marijuana    Comment: Patient denies    Sexual activity: Not Currently  Other Topics Concern   Not on file  Social History Narrative   Born and raised in [place], [place] by both parents. He has 2 sisters and is the middle child. He remains close with family. He was married for 16 yrs and seperated in 2005. He has 2 boys who are in their 20's now. He lives alone in [place]. He is working as a Charity fundraiser handed    Lives in a one story home    Social Determinants of Radio broadcast assistant Strain: Not on file  Food Insecurity: Not on file  Transportation Needs: Not on file  Physical Activity: Not on file  Stress: Not on file  Social Connections: Not on file   SDOH:  SDOH Screenings   Alcohol Screen: Medium Risk   Last Alcohol Screening Score (AUDIT): 40   Depression (PHQ2-9): Medium Risk   PHQ-2 Score: 15  Financial Resource Strain: Not on file  Food Insecurity: Not on file  Housing: Not on file  Physical Activity: Not on file  Social Connections: Not on file  Stress: Not on file  Tobacco Use: Low Risk    Smoking Tobacco Use: Never   Smokeless Tobacco Use: Never   Passive Exposure: Not on file  Transportation Needs: Not on file   Additional Social History:    Pain Medications: Please see MAR Prescriptions: Please see MAR Over the Counter: Please see MAR History of alcohol / drug use?: Yes Longest period of sobriety (when/how long): 1 year Negative Consequences of Use: Work /  School, Financial Withdrawal Symptoms: Tremors, Weakness, Nausea / Vomiting Name of Substance 1: ETOH 1 - Age of First Use: 16 1 - Amount (size/oz): 3 to 4-24 ounce can 1 - Frequency: daily 1 - Duration: ongoing 1 - Last Use / Amount: today/2 cans of beer                  Sleep: Fair  Appetite:  Fair  Current Medications:  Current Facility-Administered Medications  Medication Dose Route Frequency Provider Last Rate Last Admin   acetaminophen (TYLENOL) tablet 650 mg  650 mg Oral Q6H PRN Revonda Humphrey, NP       alum & mag hydroxide-simeth (MAALOX/MYLANTA) 200-200-20 MG/5ML suspension 30 mL  30 mL Oral Q4H PRN Revonda Humphrey, NP       amLODipine (NORVASC) tablet 5 mg  5 mg Oral Daily Revonda Humphrey, NP   5 mg at 12/28/21 1019   atorvastatin (LIPITOR) tablet 20 mg  20 mg Oral Daily Revonda Humphrey, NP   20 mg at 12/28/21 1020   escitalopram (LEXAPRO) tablet 10 mg  10 mg Oral Once Ival Bible, MD       [START ON 12/29/2021] escitalopram (LEXAPRO) tablet 20 mg  20 mg Oral Daily Ival Bible, MD       gabapentin (NEURONTIN) capsule 100 mg  100 mg Oral TID Revonda Humphrey, NP   100 mg at 12/28/21 1020   hydrOXYzine (ATARAX) tablet 25 mg  25 mg Oral Q6H PRN Revonda Humphrey, NP       loperamide (IMODIUM) capsule 2-4  mg  2-4 mg Oral PRN Revonda Humphrey, NP       LORazepam (ATIVAN) tablet 1 mg  1 mg Oral Q6H PRN Revonda Humphrey, NP       LORazepam (ATIVAN) tablet 1 mg  1 mg Oral TID Revonda Humphrey, NP   1 mg at 12/28/21 1019   Followed by   Derrill Memo ON 12/29/2021] LORazepam (ATIVAN) tablet 1 mg  1 mg Oral BID Revonda Humphrey, NP       Followed by   Derrill Memo ON 12/30/2021] LORazepam (ATIVAN) tablet 1 mg  1 mg Oral Daily Revonda Humphrey, NP       magnesium hydroxide (MILK OF MAGNESIA) suspension 30 mL  30 mL Oral Daily PRN Revonda Humphrey, NP       multivitamin with minerals tablet 1 tablet  1 tablet Oral Daily Revonda Humphrey, NP   1 tablet at 12/28/21 1019   ondansetron (ZOFRAN-ODT) disintegrating tablet 4 mg  4 mg Oral Q6H PRN Revonda Humphrey, NP   4 mg at 12/28/21 1051   thiamine tablet 100 mg  100 mg Oral Daily Thomes Lolling H, NP   100 mg at 12/28/21 1020   traZODone (DESYREL) tablet 50 mg  50 mg Oral QHS PRN Revonda Humphrey, NP   50 mg at 12/27/21 2209   Current Outpatient Medications  Medication Sig Dispense Refill   amLODipine (NORVASC) 5 MG tablet Take 1 tablet (5 mg total) by mouth daily. 90 tablet 1   atorvastatin (LIPITOR) 20 MG tablet Take 1 tablet (20 mg total) by mouth daily. 90 tablet 1   escitalopram (LEXAPRO) 20 MG tablet Take 1.5 tablets (30 mg total) by mouth daily. 135 tablet 1   gabapentin (NEURONTIN) 600 MG tablet Take 1 tablet (600 mg total) by mouth 3 (three) times daily. 90 tablet 2   traZODone (DESYREL) 100  MG tablet Take 1 tablet (100 mg total) by mouth at bedtime as needed for sleep. 90 tablet 1    Labs  Lab Results:  Admission on 12/27/2021  Component Date Value Ref Range Status   SARS Coronavirus 2 by RT PCR 12/27/2021 NEGATIVE  NEGATIVE Final   Comment: (NOTE) SARS-CoV-2 target nucleic acids are NOT DETECTED.  The SARS-CoV-2 RNA is generally detectable in upper respiratory specimens during the acute phase of infection. The lowest concentration  of SARS-CoV-2 viral copies this assay can detect is 138 copies/mL. A negative result does not preclude SARS-Cov-2 infection and should not be used as the sole basis for treatment or other patient management decisions. A negative result may occur with  improper specimen collection/handling, submission of specimen other than nasopharyngeal swab, presence of viral mutation(s) within the areas targeted by this assay, and inadequate number of viral copies(<138 copies/mL). A negative result must be combined with clinical observations, patient history, and epidemiological information. The expected result is Negative.  Fact Sheet for Patients:  EntrepreneurPulse.com.au  Fact Sheet for Healthcare Providers:  IncredibleEmployment.be  This test is no                          t yet approved or cleared by the [place] FDA and  has been authorized for detection and/or diagnosis of SARS-CoV-2 by FDA under an Emergency Use Authorization (EUA). This EUA will remain  in effect (meaning this test can be used) for the duration of the COVID-19 declaration under Section 564(b)(1) of the Act, 21 U.S.C.section 360bbb-3(b)(1), unless the authorization is terminated  or revoked sooner.       Influenza A by PCR 12/27/2021 NEGATIVE  NEGATIVE Final   Influenza B by PCR 12/27/2021 NEGATIVE  NEGATIVE Final   Comment: (NOTE) The Xpert Xpress SARS-CoV-2/FLU/RSV plus assay is intended as an aid in the diagnosis of influenza from Nasopharyngeal swab specimens and should not be used as a sole basis for treatment. Nasal washings and aspirates are unacceptable for Xpert Xpress SARS-CoV-2/FLU/RSV testing.  Fact Sheet for Patients: EntrepreneurPulse.com.au  Fact Sheet for Healthcare Providers: IncredibleEmployment.be  This test is not yet approved or cleared by the [place] FDA and has been authorized for detection and/or diagnosis  of SARS-CoV-2 by FDA under an Emergency Use Authorization (EUA). This EUA will remain in effect (meaning this test can be used) for the duration of the COVID-19 declaration under Section 564(b)(1) of the Act, 21 U.S.C. section 360bbb-3(b)(1), unless the authorization is terminated or revoked.  Performed at [facility], [place] [address], [place], [place] [address]    SARS Coronavirus 2 Ag 12/27/2021 Negative  Negative Final   WBC 12/27/2021 3.8 (L)  4.0 - 10.5 K/uL Final   RBC 12/27/2021 5.23  4.22 - 5.81 MIL/uL Final   Hemoglobin 12/27/2021 14.1  13.0 - 17.0 g/dL Final   HCT 12/27/2021 43.0  39.0 - 52.0 % Final   MCV 12/27/2021 82.2  80.0 - 100.0 fL Final   MCH 12/27/2021 27.0  26.0 - 34.0 pg Final   MCHC 12/27/2021 32.8  30.0 - 36.0 g/dL Final   RDW 12/27/2021 15.4  11.5 - 15.5 % Final   Platelets 12/27/2021 170  150 - 400 K/uL Final   nRBC 12/27/2021 0.0  0.0 - 0.2 % Final   Neutrophils Relative % 12/27/2021 65  % Final   Neutro Abs 12/27/2021 2.5  1.7 - 7.7 K/uL Final   Lymphocytes Relative  12/27/2021 23  % Final   Lymphs Abs 12/27/2021 0.9  0.7 - 4.0 K/uL Final   Monocytes Relative 12/27/2021 9  % Final   Monocytes Absolute 12/27/2021 0.3  0.1 - 1.0 K/uL Final   Eosinophils Relative 12/27/2021 1  % Final   Eosinophils Absolute 12/27/2021 0.0  0.0 - 0.5 K/uL Final   Basophils Relative 12/27/2021 1  % Final   Basophils Absolute 12/27/2021 0.0  0.0 - 0.1 K/uL Final   Immature Granulocytes 12/27/2021 1  % Final   Abs Immature Granulocytes 12/27/2021 0.02  0.00 - 0.07 K/uL Final   Performed at [facility] [address], [place], [place] [address]   Sodium 12/27/2021 135  135 - 145 mmol/L Final   Potassium 12/27/2021 3.8  3.5 - 5.1 mmol/L Final   Chloride 12/27/2021 97 (L)  98 - 111 mmol/L Final   CO2 12/27/2021 27  22 - 32 mmol/L Final   Glucose, Bld 12/27/2021 83  70 - 99 mg/dL Final   Glucose reference range applies only to samples taken after fasting for at  least 8 hours.   BUN 12/27/2021 <5 (L)  6 - 20 mg/dL Final   Creatinine, Ser 12/27/2021 1.02  0.61 - 1.24 mg/dL Final   Calcium 12/27/2021 9.1  8.9 - 10.3 mg/dL Final   Total Protein 12/27/2021 8.2 (H)  6.5 - 8.1 g/dL Final   Albumin 12/27/2021 4.4  3.5 - 5.0 g/dL Final   AST 12/27/2021 31  15 - 41 U/L Final   ALT 12/27/2021 18  0 - 44 U/L Final   Alkaline Phosphatase 12/27/2021 36 (L)  38 - 126 U/L Final   Total Bilirubin 12/27/2021 0.7  0.3 - 1.2 mg/dL Final   GFR, Estimated 12/27/2021 >60  >60 mL/min Final   Comment: (NOTE) Calculated using the CKD-EPI Creatinine Equation (2021)    Anion gap 12/27/2021 11  5 - 15 Final   Performed at [facility] [address], [place], [place] [address]   Hgb A1c MFr Bld 12/27/2021 5.6  4.8 - 5.6 % Final   Comment: (NOTE) Pre diabetes:          5.7%-6.4%  Diabetes:              >6.4%  Glycemic control for   <7.0% adults with diabetes    Mean Plasma Glucose 12/27/2021 114.02  mg/dL Final   Performed at [facility], [place] [address] [place], [place] [address]   Magnesium 12/27/2021 2.1  1.7 - 2.4 mg/dL Final   Performed at [facility] [address] [place], [place] [address]   Alcohol, Ethyl (B) 12/27/2021 133 (H)  <10 mg/dL Final   Comment: (NOTE) Lowest detectable limit for serum alcohol is 10 mg/dL.  For medical purposes only. Performed at [facility], [place] [address], [place], [place] [address]    TSH 12/27/2021 0.786  0.350 - 4.500 uIU/mL Final   Comment: Performed by a 3rd Generation assay with a functional sensitivity of <=0.01 uIU/mL. Performed at [facility], [place] [address] [place], [place] [address]    RPR Ser Ql 12/27/2021 NON REACTIVE  NON REACTIVE Final   Performed at [facility], [place] [address], [place], [place] [address]   Color, Urine 12/27/2021 YELLOW  YELLOW Final   APPearance 12/27/2021 CLEAR  CLEAR Final   Specific Gravity, Urine 12/27/2021 1.020  1.005 - 1.030 Final   pH  12/27/2021  5.5  5.0 - 8.0 Final   Glucose, UA 12/27/2021 >=500 (A)  NEGATIVE mg/dL Final   Hgb urine dipstick 12/27/2021 NEGATIVE  NEGATIVE Final   Bilirubin Urine 12/27/2021 NEGATIVE  NEGATIVE Final   Ketones, ur 12/27/2021 NEGATIVE  NEGATIVE mg/dL Final   Protein, ur 12/27/2021 NEGATIVE  NEGATIVE mg/dL Final   Nitrite 12/27/2021 NEGATIVE  NEGATIVE Final   Leukocytes,Ua 12/27/2021 NEGATIVE  NEGATIVE Final   Squamous Epithelial / LPF 12/27/2021 NONE SEEN  0 - 5 Final   WBC, UA 12/27/2021 0-5  0 - 5 WBC/hpf Final   RBC / HPF 12/27/2021 0-5  0 - 5 RBC/hpf Final   Bacteria, UA 12/27/2021 RARE (A)  NONE SEEN Final   Performed at [facility], [place] [address], [place], [place] [address]   POC Amphetamine UR 12/27/2021 None Detected  NONE DETECTED (Cut Off Level 1000 ng/mL) Final   POC Secobarbital (BAR) 12/27/2021 None Detected  NONE DETECTED (Cut Off Level 300 ng/mL) Final   POC Buprenorphine (BUP) 12/27/2021 None Detected  NONE DETECTED (Cut Off Level 10 ng/mL) Final   POC Oxazepam (BZO) 12/27/2021 None Detected  NONE DETECTED (Cut Off Level 300 ng/mL) Final   POC Cocaine UR 12/27/2021 None Detected  NONE DETECTED (Cut Off Level 300 ng/mL) Final   POC Methamphetamine UR 12/27/2021 None Detected  NONE DETECTED (Cut Off Level 1000 ng/mL) Final   POC Morphine 12/27/2021 None Detected  NONE DETECTED (Cut Off Level 300 ng/mL) Final   POC Oxycodone UR 12/27/2021 None Detected  NONE DETECTED (Cut Off Level 100 ng/mL) Final   POC Methadone UR 12/27/2021 None Detected  NONE DETECTED (Cut Off Level 300 ng/mL) Final   POC Marijuana UR 12/27/2021 None Detected  NONE DETECTED (Cut Off Level 50 ng/mL) Final   HIV Screen 4th Generation wRfx 12/27/2021 Non Reactive  Non Reactive Final   Performed at [facility], [place] [address], [place], [place] [address]   SARSCOV2ONAVIRUS 2 AG 12/27/2021 NEGATIVE  NEGATIVE Final   Comment: (NOTE) SARS-CoV-2 antigen NOT DETECTED.   Negative results are  presumptive.  Negative results do not preclude SARS-CoV-2 infection and should not be used as the sole basis for treatment or other patient management decisions, including infection  control decisions, particularly in the presence of clinical signs and  symptoms consistent with COVID-19, or in those who have been in contact with the virus.  Negative results must be combined with clinical observations, patient history, and epidemiological information. The expected result is Negative.  Fact Sheet for Patients: HandmadeRecipes.com.cy  Fact Sheet for Healthcare Providers: FuneralLife.at  This test is not yet approved or cleared by the [place] FDA and  has been authorized for detection and/or diagnosis of SARS-CoV-2 by FDA under an Emergency Use Authorization (EUA).  This EUA will remain in effect (meaning this test can be used) for the duration of  the COV                          ID-19 declaration under Section 564(b)(1) of the Act, 21 U.S.C. section 360bbb-3(b)(1), unless the authorization is terminated or revoked sooner.    Office Visit on 12/02/2021  Component Date Value Ref Range Status   Glucose 12/02/2021 90  70 - 99 mg/dL Final   BUN 12/02/2021 8  6 - 24 mg/dL Final   Creatinine, Ser 12/02/2021 1.17  0.76 - 1.27 mg/dL Final   eGFR 12/02/2021 72  >59 mL/min/1.73 Final  BUN/Creatinine Ratio 12/02/2021 7 (L)  9 - 20 Final   Sodium 12/02/2021 135  134 - 144 mmol/L Final   Potassium 12/02/2021 4.7  3.5 - 5.2 mmol/L Final   Chloride 12/02/2021 94 (L)  96 - 106 mmol/L Final   CO2 12/02/2021 25  20 - 29 mmol/L Final   Calcium 12/02/2021 9.7  8.7 - 10.2 mg/dL Final   Total Protein 12/02/2021 8.9 (H)  6.0 - 8.5 g/dL Final   Albumin 12/02/2021 5.3 (H)  3.8 - 4.9 g/dL Final   Globulin, Total 12/02/2021 3.6  1.5 - 4.5 g/dL Final   Albumin/Globulin Ratio 12/02/2021 1.5  1.2 - 2.2 Final   Bilirubin Total 12/02/2021 0.8  0.0 - 1.2 mg/dL  Final   Alkaline Phosphatase 12/02/2021 56  44 - 121 IU/L Final   AST 12/02/2021 23  0 - 40 IU/L Final   ALT 12/02/2021 13  0 - 44 IU/L Final   Cholesterol, Total 12/02/2021 295 (H)  100 - 199 mg/dL Final   Triglycerides 12/02/2021 58  0 - 149 mg/dL Final   HDL 12/02/2021 170  >39 mg/dL Final   Comment: Results confirmed on dilution.    VLDL Cholesterol Cal 12/02/2021 8  5 - 40 mg/dL Final   LDL Chol Calc (NIH) 12/02/2021 117 (H)  0 - 99 mg/dL Final   Chol/HDL Ratio 12/02/2021 1.7  0.0 - 5.0 ratio Final   Comment:                                   T. Chol/HDL Ratio                                             Men  Women                               1/2 Avg.Risk  3.4    3.3                                   Avg.Risk  5.0    4.4                                2X Avg.Risk  9.6    7.1                                3X Avg.Risk 23.4   11.0   Office Visit on 08/18/2021  Component Date Value Ref Range Status   Hemoglobin A1C 08/18/2021 5.5  4.0 - 5.6 % Final  Admission on 08/01/2021, Discharged on 08/01/2021  Component Date Value Ref Range Status   Sodium 08/01/2021 131 (L)  135 - 145 mmol/L Final   Potassium 08/01/2021 4.0  3.5 - 5.1 mmol/L Final   Chloride 08/01/2021 94 (L)  98 - 111 mmol/L Final   CO2 08/01/2021 25  22 - 32 mmol/L Final   Glucose, Bld 08/01/2021 110 (H)  70 - 99 mg/dL Final   Glucose reference range applies only to samples taken after fasting for at least 8 hours.  BUN 08/01/2021 8  6 - 20 mg/dL Final   Creatinine, Ser 08/01/2021 0.97  0.61 - 1.24 mg/dL Final   Calcium 08/01/2021 9.2  8.9 - 10.3 mg/dL Final   Total Protein 08/01/2021 9.0 (H)  6.5 - 8.1 g/dL Final   Albumin 08/01/2021 4.5  3.5 - 5.0 g/dL Final   AST 08/01/2021 69 (H)  15 - 41 U/L Final   ALT 08/01/2021 29  0 - 44 U/L Final   Alkaline Phosphatase 08/01/2021 40  38 - 126 U/L Final   Total Bilirubin 08/01/2021 1.2  0.3 - 1.2 mg/dL Final   GFR, Estimated 08/01/2021 >60  >60 mL/min Final   Comment:  (NOTE) Calculated using the CKD-EPI Creatinine Equation (2021)    Anion gap 08/01/2021 12  5 - 15 Final   Performed at [facility], [place] [address] [place], [place] [address]   WBC 08/01/2021 5.8  4.0 - 10.5 K/uL Final   RBC 08/01/2021 5.00  4.22 - 5.81 MIL/uL Final   Hemoglobin 08/01/2021 13.1  13.0 - 17.0 g/dL Final   HCT 08/01/2021 40.2  39.0 - 52.0 % Final   MCV 08/01/2021 80.4  80.0 - 100.0 fL Final   MCH 08/01/2021 26.2  26.0 - 34.0 pg Final   MCHC 08/01/2021 32.6  30.0 - 36.0 g/dL Final   RDW 08/01/2021 16.5 (H)  11.5 - 15.5 % Final   Platelets 08/01/2021 160  150 - 400 K/uL Final   nRBC 08/01/2021 0.0  0.0 - 0.2 % Final   Neutrophils Relative % 08/01/2021 70  % Final   Neutro Abs 08/01/2021 4.1  1.7 - 7.7 K/uL Final   Lymphocytes Relative 08/01/2021 18  % Final   Lymphs Abs 08/01/2021 1.0  0.7 - 4.0 K/uL Final   Monocytes Relative 08/01/2021 9  % Final   Monocytes Absolute 08/01/2021 0.5  0.1 - 1.0 K/uL Final   Eosinophils Relative 08/01/2021 1  % Final   Eosinophils Absolute 08/01/2021 0.0  0.0 - 0.5 K/uL Final   Basophils Relative 08/01/2021 1  % Final   Basophils Absolute 08/01/2021 0.0  0.0 - 0.1 K/uL Final   Immature Granulocytes 08/01/2021 1  % Final   Abs Immature Granulocytes 08/01/2021 0.03  0.00 - 0.07 K/uL Final   Performed at [facility], [place] [address], [place], [place] [address]   Lipase 08/01/2021 73 (H)  11 - 51 U/L Final   Performed at [facility], [place] [address] [place], [place] [address]   Alcohol, Ethyl (B) 08/01/2021 217 (H)  <10 mg/dL Final   Comment: (NOTE) Lowest detectable limit for serum alcohol is 10 mg/dL.  For medical purposes only. Performed at [facility], [place] [address] [place], [place] [address]    Magnesium 08/01/2021 2.1  1.7 - 2.4 mg/dL Final   Performed at [facility] [address], [place], [place] [address]  Lab on 07/04/2021   Component Date Value Ref Range Status   Vitamin B1 (Thiamine) 07/04/2021 89 (H)  8 - 30 nmol/L Final   Comment: Marland Kitchen Vitamin supplementation within 24 hours prior to blood draw may affect the accuracy of the results. . This test was developed and its analytical performance characteristics have been determined by [place], [place]. It has not been cleared or approved by the U.S. Food and Drug Administration. This assay has been validated pursuant to the CLIA regulations and is used for clinical purposes. Marland Kitchen  Vitamin B-12 07/04/2021 361  211 - 911 pg/mL Final   Folate 07/04/2021 12.2  >5.9 ng/mL Final    Blood Alcohol level:  Lab Results  Component Value Date   ETH 133 (H) 12/27/2021   ETH 217 (H) 56/43/3295    Metabolic Disorder Labs: Lab Results  Component Value Date   HGBA1C 5.6 12/27/2021   MPG 114.02 12/27/2021   MPG 122.63 03/16/2021   No results found for: PROLACTIN Lab Results  Component Value Date   CHOL 295 (H) 12/02/2021   TRIG 58 12/02/2021   HDL 170 12/02/2021   CHOLHDL 1.7 12/02/2021   VLDL 11 03/16/2021   LDLCALC 117 (H) 12/02/2021   LDLCALC NOT CALCULATED 03/16/2021    Therapeutic Lab Levels: No results found for: LITHIUM No results found for: VALPROATE No components found for:  CBMZ  Physical Findings   AIMS    Flowsheet Row Admission (Discharged) from 03/16/2021 in [place] 400B Admission (Discharged) from 05/04/2018 in [place] 300B Admission (Discharged) from 06/29/2017 in [place] 300B  AIMS Total Score 0 0 0      AUDIT    Flowsheet Row Admission (Discharged) from 03/16/2021 in [place] 400B Admission (Discharged) from 05/04/2018 in [place] 300B Admission (Discharged) from 06/29/2017 in [place] 300B Admission (Discharged)  from 09/06/2014 in [place] Admission (Discharged) from 06/01/2014 in [place] 300B  Alcohol Use Disorder Identification Test Final Score (AUDIT) 40 34 35 26 [place] Office Visit from 12/02/2021 in [place] Office Visit from 10/10/2021 in [place] Office Visit from 08/18/2021 in [place] 1 Office Visit from 04/07/2021 in [place] 1 Office Visit from 12/19/2017 in [place]  Total GAD-7 Score '13 13 11 8 6      ' PHQ2-9    [place] Office Visit from 12/02/2021 in [place] Office Visit from 10/10/2021 in [place] Office Visit from 08/18/2021 in [place] 1 Office Visit from 04/07/2021 in [place] 1 ED from 03/16/2021 in [facility]  PHQ-2 Total Score 5 1 0 0 4  PHQ-9 Total Score '15 7 5 3 13      ' Flowsheet Row ED from 12/27/2021 in [facility] ED from 08/01/2021 in [place] DEPT ED from 06/11/2021 in [place] DEPT  C-SSRS RISK CATEGORY No Risk No Risk No Risk        Musculoskeletal  Strength & Muscle Tone: within normal limits Gait & Station: normal Patient leans: N/A  Psychiatric Specialty Exam  Presentation  General Appearance: Appropriate for Environment; Casual  Eye Contact:Good  Speech:Clear and Coherent; Normal Rate  Speech Volume:Normal  Handedness:Right   Mood and Affect  Mood:Anxious  Affect:Appropriate; Congruent   Thought Process  Thought Processes:Coherent; Goal Directed; Linear  Descriptions of Associations:Intact  Orientation:Full (Time, Place and Person)  Thought Content:Logical  Diagnosis of Schizophrenia or Schizoaffective disorder in past: No     Hallucinations:Hallucinations: None  Ideas of Reference:None  Suicidal Thoughts:Suicidal Thoughts: No  Homicidal Thoughts:Homicidal Thoughts: No   Sensorium  Memory:Immediate Good; Recent Good; Remote Good  Judgment:Good  Insight:Good   Executive Functions  Concentration:Good  Attention Span:Good  Recall:Good  Fund of Knowledge:Good  Language:Good   Psychomotor Activity  Psychomotor Activity:Psychomotor Activity: Normal   Assets  Assets:Communication Skills; Desire for Improvement; Financial Resources/Insurance; Physical Health; Social Support; Resilience; Leisure Time   Sleep  Sleep:Sleep: Fair   Nutritional Assessment (For OBS and [facility]BC admissions only) Has the patient had a weight loss or gain of 10 pounds or more in the last 3 months?: No Has the patient had a decrease in food intake/or appetite?: Yes Does the patient have dental problems?: No Does the patient have eating habits or behaviors that may be indicators of an eating disorder including binging or inducing vomiting?: No Has the patient recently lost weight without trying?: 0 Has the patient been eating poorly because of a decreased appetite?: 1 Malnutrition Screening Tool Score: 1    Physical Exam  Physical Exam Constitutional:      Appearance: Normal appearance. He is normal weight.  HENT:     Head: Normocephalic and atraumatic.  Eyes:     Extraocular Movements: Extraocular movements intact.  Cardiovascular:     Rate and Rhythm: Normal rate and regular rhythm.     Heart sounds: Normal heart sounds.  Pulmonary:     Effort: Pulmonary effort is normal.     Breath sounds: Normal breath sounds.  Abdominal:     General: Abdomen is flat. Bowel sounds are normal.     Palpations: Abdomen is soft.  Neurological:     General: No focal deficit present.     Mental Status: He is alert and oriented to person, place, and time.     Comments: Mild tremor in bilateral hands with outstretched arms   Psychiatric:        Attention and Perception: Attention and perception normal.        Speech: Speech normal.        Behavior: Behavior normal. Behavior is cooperative.        Thought Content: Thought content normal.   Review of Systems  Constitutional:  Negative for chills and fever.  HENT:  Negative for hearing loss.   Eyes:  Negative for discharge and redness.  Respiratory:  Negative for cough.   Cardiovascular:  Negative for chest pain.  Gastrointestinal:  Positive for nausea. Negative for abdominal pain.  Musculoskeletal:  Negative for myalgias.  Neurological:  Positive for tremors. Negative for headaches.  Psychiatric/Behavioral:  Positive for substance abuse. Negative for hallucinations and suicidal ideas. The patient is nervous/anxious.   Blood pressure (!) 132/94, pulse 87, temperature (!) 97.5 F (36.4 C), temperature source Temporal, resp. rate 16, SpO2 100 %. There is no height or weight on file to calculate BMI.  Treatment Plan Summary: 61 year old male with history of alcohol use, depression and anxiety who presented to the [facility] on 12/27/2021 for alcohol detox.  EtOH 133; UDS negative. Patient was admitted to the [facility] for continued detox and criss stabilization.   . Patient tolerating Ativan taper well for alcohol withdrawal.  Patient requesting medication adjustment to assist with anxiety.  After discussing options- is agreeable to increasing Lexapro to 20 mg daily with an additional one time dose of 10 mg to be given today (total of 20 mg today including scheduled 10 mg dose).  Patient remains appropriate for continued treatment at the [facility] For alcohol detox and continued stabilization   MDD Anxiety -increase 10 mg lexapro to 20 mg lazapro (2/1)  AUD, severe -continue CIWA protocol -continue ativan detox- 1 mg QID --> 1 mg TID--> 1  mg BID--> 1 mg daily -multivitamin -thiamine  HTN -continue norvasc 5 mg  Neuropathy -continue gabapentin 100 mg tID  -gabapentin may  also a/w alcohol withdrawal/cravings  HLD -continue lipitor 20 mg  Dispo: Anticipate dc Friday after completion of ativan detox protocol with outpatient substance use resources-LCSW assisting with followup  Ival Bible, MD 12/28/2021 1:33 PM

## 2021-12-28 NOTE — ED Notes (Signed)
Pt pleasant and sitting in dayroom watching tv.  Denies SI, HI, and AVH.  Reports no pain at this time but did state he has intermittent LLQ pain that currently is causing him no issue.  Pt reports having a BM yesterday.  Encouraged pt to let staff know if he has any GI upset or no BM for several days so we can provide prn medications for bowl protocol. Pt also stated he was feeling "woozy" this morning and believes his night time sleep medication may be the cause of it.  Pt does report sleeping well through the night. Will continue to monitor for safety.

## 2021-12-29 ENCOUNTER — Ambulatory Visit: Payer: BC Managed Care – PPO | Admitting: Orthopedic Surgery

## 2021-12-29 NOTE — ED Notes (Signed)
Pt asleep in bed. Respirations even and unlabored. Will continue to monitor for safety. ?

## 2021-12-29 NOTE — ED Notes (Signed)
Patient participated in [place] and wrap up group and gave positive feedback. Staff commended patient for interacting in a positive manner during group.

## 2021-12-29 NOTE — Progress Notes (Signed)
Pt's CIWA was 6.

## 2021-12-29 NOTE — ED Notes (Addendum)
Cheez its and juice given per pt request. Pt currently in dining room attending [employer] meeting. Will continue to monitor for safety.

## 2021-12-29 NOTE — Progress Notes (Signed)
Pt had lunch and is presently watching TV. No distress noted. Staff will monitor for pt's safety.

## 2021-12-29 NOTE — Progress Notes (Signed)
Pt's CIWA was 5.

## 2021-12-29 NOTE — ED Notes (Signed)
Pt sitting in dining room watching TV. A&O x4, calm and cooperative. Denies SI/HI/AVH. Denies any immediate needs. No signs of acute distress noted. Will continue to monitor for safety.

## 2021-12-29 NOTE — Progress Notes (Signed)
Pt is watching TV quietly. No distress noted. Staff will monitor for pt's safety.

## 2021-12-29 NOTE — ED Provider Notes (Addendum)
Behavioral Health Progress Note  Date and Time: 12/29/2021 7:15 PM Name: [name] [name] MRN:  [id number]  Subjective:   [name] [name], 61 y.o., male patient with history of alcohol use, depression and anxiety who presented to the [facility] on 12/27/2021 for alcohol detox.  EtOH 133; UDS negative. Patient was admitted to the [facility] for continued detox and criss stabilization.  Patient seen face to face by this provider, Dr. Dwyane Dee; and  chart reviewed on 12/29/21.     During evaluation [name] [name] is in sitting position in the day room watching TV.  He is alert/oriented x4; cooperative and pleasant.  His speech is clear, coherent, normal rate and tone.  He makes good eye contact.  He denies depression.  He continues to endorse anxiety.  He is logical and able to answer questions appropriately.  His insight and judgment are intact.  He denies SI/HI/AVH. He has been appropriately with staff and other patients.  He denies any withdrawal symptoms.  He is tolerating medications without any adverse reactions.  Reports previously he had had a hand tremor but it is almost resolved at this point.  Discussed discharge planning with patient.  Explained that his Ativan taper will and on Friday morning. He is not interested in residential substance abuse treatment.  He is requesting outpatient services.  His plan is to return home to his parents house.  Reports he is not quite ready to discharge and has requested to be discharged on Saturday.   Diagnosis:  Final diagnoses:  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HCC)  Generalized anxiety disorder  Moderate episode of recurrent major depressive disorder (HCC)  Alcohol use disorder, moderate, in controlled environment ([place])    Total Time spent with patient: 20 minutes  Past Psychiatric History: See H&P Past Medical History:  Past Medical History:  Diagnosis Date   Alcohol abuse    Anxiety    About 61 years of age   Carpal tunnel syndrome of right wrist Dx  2015   Depression    At 61 years of age   GERD (gastroesophageal reflux disease) Dx 2007   Hepatitis, alcoholic, acute 4098   Hyperlipidemia    borderline, diet controlled   Neuropathy    Pancreatitis Dx 2014   Pneumonia 11/2014   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   Reflux Dx 2007   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    Past Surgical History:  Procedure Laterality Date   carpel tunnel release Right 07/28/2014    done in [place] [facility]     KNEE SURGERY Left    15 years ago   LIPOMA EXCISION N/A 05/10/2016   Procedure: EXCISION OF SCALP LIPOMA;  Surgeon: Johnathan Hausen, MD;  Location: [place];  Service: General;  Laterality: N/A;   Family History:  Family History  Problem Relation Age of Onset   Diabetes Father    Hypertension Father    Hypertension Paternal Grandfather    Alcohol abuse Paternal Grandfather    Alcohol abuse Maternal Grandfather    Alcohol abuse Maternal Grandmother    Alcohol abuse Paternal Grandmother    Colon cancer Neg Hx    Rectal cancer Neg Hx    Stomach cancer Neg Hx    Bipolar disorder Neg Hx    Depression Neg Hx    Family Psychiatric  History: See H&P Social History:  Social History   Substance and Sexual Activity  Alcohol Use  Yes   Comment: 1 beer     Social History   Substance and Sexual Activity  Drug Use Not Currently   Types: Marijuana   Comment: Patient denies     Social History   Socioeconomic History   Marital status: Legally Separated    Spouse name: Not on file   Number of children: 2   Years of education: 13 yrs   Highest education level: Not on file  Occupational History   Occupation: custodian    Comment: [employer]  Tobacco Use   Smoking status: Never   Smokeless tobacco: Never  Vaping Use   Vaping Use: Never used  Substance and Sexual Activity   Alcohol use: Yes    Comment: 1 beer   Drug use: Not Currently    Types: Marijuana     Comment: Patient denies    Sexual activity: Not Currently  Other Topics Concern   Not on file  Social History Narrative   Born and raised in [place], [place] by both parents. He has 2 sisters and is the middle child. He remains close with family. He was married for 16 yrs and seperated in 2005. He has 2 boys who are in their 20's now. [place]. He is working as a Charity fundraiser handed    Lives in a one story home    Social Determinants of Radio broadcast assistant Strain: Not on file  Food Insecurity: Not on file  Transportation Needs: Not on file  Physical Activity: Not on file  Stress: Not on file  Social Connections: Not on file   SDOH:  SDOH Screenings   Alcohol Screen: Medium Risk   Last Alcohol Screening Score (AUDIT): 40  Depression (PHQ2-9): Medium Risk   PHQ-2 Score: 15  Financial Resource Strain: Not on file  Food Insecurity: Not on file  Housing: Not on file  Physical Activity: Not on file  Social Connections: Not on file  Stress: Not on file  Tobacco Use: Low Risk    Smoking Tobacco Use: Never   Smokeless Tobacco Use: Never   Passive Exposure: Not on file  Transportation Needs: Not on file   Additional Social History:    Pain Medications: Please see MAR Prescriptions: Please see MAR Over the Counter: Please see MAR History of alcohol / drug use?: Yes Longest period of sobriety (when/how long): 1 year Negative Consequences of Use: Work / Youth worker, Museum/gallery curator Withdrawal Symptoms: Tremors, Weakness, Nausea / Vomiting Name of Substance 1: ETOH 1 - Age of First Use: 16 1 - Amount (size/oz): 3 to 4-24 ounce can 1 - Frequency: daily 1 - Duration: ongoing 1 - Last Use / Amount: today/2 cans of beer                  Sleep: Good  Appetite:  Good  Current Medications:  Current Facility-Administered Medications  Medication Dose Route Frequency Provider Last Rate Last Admin   acetaminophen (TYLENOL) tablet 650 mg  650 mg Oral Q6H  PRN Revonda Humphrey, NP       alum & mag hydroxide-simeth (MAALOX/MYLANTA) 200-200-20 MG/5ML suspension 30 mL  30 mL Oral Q4H PRN Revonda Humphrey, NP       amLODipine (NORVASC) tablet 5 mg  5 mg Oral Daily Thomes Lolling H, NP   5 mg at 12/29/21 0905   atorvastatin (LIPITOR) tablet 20 mg  20 mg Oral Daily Revonda Humphrey, NP  20 mg at 12/29/21 0905   escitalopram (LEXAPRO) tablet 20 mg  20 mg Oral Daily Ival Bible, MD   20 mg at 12/29/21 1062   gabapentin (NEURONTIN) capsule 100 mg  100 mg Oral TID Revonda Humphrey, NP   100 mg at 12/29/21 1640   hydrOXYzine (ATARAX) tablet 25 mg  25 mg Oral Q6H PRN Revonda Humphrey, NP   25 mg at 12/29/21 1832   loperamide (IMODIUM) capsule 2-4 mg  2-4 mg Oral PRN Revonda Humphrey, NP       LORazepam (ATIVAN) tablet 1 mg  1 mg Oral Q6H PRN Revonda Humphrey, NP       LORazepam (ATIVAN) tablet 1 mg  1 mg Oral BID Revonda Humphrey, NP   1 mg at 12/29/21 6948   Followed by   Derrill Memo ON 12/30/2021] LORazepam (ATIVAN) tablet 1 mg  1 mg Oral Daily Revonda Humphrey, NP       magnesium hydroxide (MILK OF MAGNESIA) suspension 30 mL  30 mL Oral Daily PRN Revonda Humphrey, NP       multivitamin with minerals tablet 1 tablet  1 tablet Oral Daily Revonda Humphrey, NP   1 tablet at 12/29/21 0906   ondansetron (ZOFRAN-ODT) disintegrating tablet 4 mg  4 mg Oral Q6H PRN Revonda Humphrey, NP   4 mg at 12/28/21 1051   thiamine tablet 100 mg  100 mg Oral Daily Revonda Humphrey, NP   100 mg at 12/29/21 5462   traZODone (DESYREL) tablet 50 mg  50 mg Oral QHS PRN Revonda Humphrey, NP   50 mg at 12/28/21 2107   Current Outpatient Medications  Medication Sig Dispense Refill   amLODipine (NORVASC) 5 MG tablet Take 1 tablet (5 mg total) by mouth daily. 90 tablet 1   atorvastatin (LIPITOR) 20 MG tablet Take 1 tablet (20 mg total) by mouth daily. 90 tablet 1   escitalopram (LEXAPRO) 20 MG tablet Take 1.5 tablets (30 mg total) by mouth daily. 135  tablet 1   gabapentin (NEURONTIN) 600 MG tablet Take 1 tablet (600 mg total) by mouth 3 (three) times daily. 90 tablet 2   traZODone (DESYREL) 100 MG tablet Take 1 tablet (100 mg total) by mouth at bedtime as needed for sleep. 90 tablet 1    Labs  Lab Results:  Admission on 12/27/2021  Component Date Value Ref Range Status   SARS Coronavirus 2 by RT PCR 12/27/2021 NEGATIVE  NEGATIVE Final   Comment: (NOTE) SARS-CoV-2 target nucleic acids are NOT DETECTED.  The SARS-CoV-2 RNA is generally detectable in upper respiratory specimens during the acute phase of infection. The lowest concentration of SARS-CoV-2 viral copies this assay can detect is 138 copies/mL. A negative result does not preclude SARS-Cov-2 infection and should not be used as the sole basis for treatment or other patient management decisions. A negative result may occur with  improper specimen collection/handling, submission of specimen other than nasopharyngeal swab, presence of viral mutation(s) within the areas targeted by this assay, and inadequate number of viral copies(<138 copies/mL). A negative result must be combined with clinical observations, patient history, and epidemiological information. The expected result is Negative.  Fact Sheet for Patients:  EntrepreneurPulse.com.au  Fact Sheet for Healthcare Providers:  IncredibleEmployment.be  This test is no                          t yet approved or cleared  by the [place] and  has been authorized for detection and/or diagnosis of SARS-CoV-2 by FDA under an Emergency Use Authorization (EUA). This EUA will remain  in effect (meaning this test can be used) for the duration of the COVID-19 declaration under Section 564(b)(1) of the Act, 21 U.S.C.section 360bbb-3(b)(1), unless the authorization is terminated  or revoked sooner.       Influenza A by PCR 12/27/2021 NEGATIVE  NEGATIVE Final   Influenza B by PCR  12/27/2021 NEGATIVE  NEGATIVE Final   Comment: (NOTE) The Xpert Xpress SARS-CoV-2/FLU/RSV plus assay is intended as an aid in the diagnosis of influenza from Nasopharyngeal swab specimens and should not be used as a sole basis for treatment. Nasal washings and aspirates are unacceptable for Xpert Xpress SARS-CoV-2/FLU/RSV testing.  Fact Sheet for Patients: EntrepreneurPulse.com.au  Fact Sheet for Healthcare Providers: IncredibleEmployment.be  This test is not yet approved or cleared by the [place] FDA and has been authorized for detection and/or diagnosis of SARS-CoV-2 by FDA under an Emergency Use Authorization (EUA). This EUA will remain in effect (meaning this test can be used) for the duration of the COVID-19 declaration under Section 564(b)(1) of the Act, 21 U.S.C. section 360bbb-3(b)(1), unless the authorization is terminated or revoked.  Performed at [facility], [place] [address], [place],  [address]    SARS Coronavirus 2 Ag 12/27/2021 Negative  Negative Final   WBC 12/27/2021 3.8 (L)  4.0 - 10.5 K/uL Final   RBC 12/27/2021 5.23  4.22 - 5.81 MIL/uL Final   Hemoglobin 12/27/2021 14.1  13.0 - 17.0 g/dL Final   HCT 12/27/2021 43.0  39.0 - 52.0 % Final   MCV 12/27/2021 82.2  80.0 - 100.0 fL Final   MCH 12/27/2021 27.0  26.0 - 34.0 pg Final   MCHC 12/27/2021 32.8  30.0 - 36.0 g/dL Final   RDW 12/27/2021 15.4  11.5 - 15.5 % Final   Platelets 12/27/2021 170  150 - 400 K/uL Final   nRBC 12/27/2021 0.0  0.0 - 0.2 % Final   Neutrophils Relative % 12/27/2021 65  % Final   Neutro Abs 12/27/2021 2.5  1.7 - 7.7 K/uL Final   Lymphocytes Relative 12/27/2021 23  % Final   Lymphs Abs 12/27/2021 0.9  0.7 - 4.0 K/uL Final   Monocytes Relative 12/27/2021 9  % Final   Monocytes Absolute 12/27/2021 0.3  0.1 - 1.0 K/uL Final   Eosinophils Relative 12/27/2021 1  % Final   Eosinophils Absolute 12/27/2021 0.0  0.0 - 0.5 K/uL Final    Basophils Relative 12/27/2021 1  % Final   Basophils Absolute 12/27/2021 0.0  0.0 - 0.1 K/uL Final   Immature Granulocytes 12/27/2021 1  % Final   Abs Immature Granulocytes 12/27/2021 0.02  0.00 - 0.07 K/uL Final   Performed at [facility], [place] [name], [place], [place] [address]   Sodium 12/27/2021 135  135 - 145 mmol/L Final   Potassium 12/27/2021 3.8  3.5 - 5.1 mmol/L Final   Chloride 12/27/2021 97 (L)  98 - 111 mmol/L Final   CO2 12/27/2021 27  22 - 32 mmol/L Final   Glucose, Bld 12/27/2021 83  70 - 99 mg/dL Final   Glucose reference range applies only to samples taken after fasting for at least 8 hours.   BUN 12/27/2021 <5 (L)  6 - 20 mg/dL Final   Creatinine, Ser 12/27/2021 1.02  0.61 - 1.24 mg/dL Final   Calcium 12/27/2021 9.1  8.9 - 10.3 mg/dL Final   Total Protein 12/27/2021 8.2 (H)  6.5 - 8.1 g/dL Final   Albumin 12/27/2021 4.4  3.5 - 5.0 g/dL Final   AST 12/27/2021 31  15 - 41 U/L Final   ALT 12/27/2021 18  0 - 44 U/L Final   Alkaline Phosphatase 12/27/2021 36 (L)  38 - 126 U/L Final   Total Bilirubin 12/27/2021 0.7  0.3 - 1.2 mg/dL Final   GFR, Estimated 12/27/2021 >60  >60 mL/min Final   Comment: (NOTE) Calculated using the CKD-EPI Creatinine Equation (2021)    Anion gap 12/27/2021 11  5 - 15 Final   Performed at [facility], [place] [address], [place], [place] [address]   Hgb A1c MFr Bld 12/27/2021 5.6  4.8 - 5.6 % Final   Comment: (NOTE) Pre diabetes:          5.7%-6.4%  Diabetes:              >6.4%  Glycemic control for   <7.0% adults with diabetes    Mean Plasma Glucose 12/27/2021 114.02  mg/dL Final   Performed at [facility], [place] [address] [place], [place] [address]   Magnesium 12/27/2021 2.1  1.7 - 2.4 mg/dL Final   Performed at [place] [address] [place], [place] [address]   Alcohol, Ethyl (B) 12/27/2021 133 (H)  <10 mg/dL Final   Comment: (NOTE) Lowest detectable limit for serum alcohol is 10 mg/dL.  For medical  purposes only. Performed at [facility], [place] [address], [place], [place] [address]    TSH 12/27/2021 0.786  0.350 - 4.500 uIU/mL Final   Comment: Performed by a 3rd Generation assay with a functional sensitivity of <=0.01 uIU/mL. Performed at [facility], [place] [address] [place], [place] [address]    RPR Ser Ql 12/27/2021 NON REACTIVE  NON REACTIVE Final   Performed at [facility], [place] [address], [place], [place] [address]   Color, Urine 12/27/2021 YELLOW  YELLOW Final   APPearance 12/27/2021 CLEAR  CLEAR Final   Specific Gravity, Urine 12/27/2021 1.020  1.005 - 1.030 Final   pH 12/27/2021 5.5  5.0 - 8.0 Final   Glucose, UA 12/27/2021 >=500 (A)  NEGATIVE mg/dL Final   Hgb urine dipstick 12/27/2021 NEGATIVE  NEGATIVE Final   Bilirubin Urine 12/27/2021 NEGATIVE  NEGATIVE Final   Ketones, ur 12/27/2021 NEGATIVE  NEGATIVE mg/dL Final   Protein, ur 12/27/2021 NEGATIVE  NEGATIVE mg/dL Final   Nitrite 12/27/2021 NEGATIVE  NEGATIVE Final   Leukocytes,Ua 12/27/2021 NEGATIVE  NEGATIVE Final   Squamous Epithelial / LPF 12/27/2021 NONE SEEN  0 - 5 Final   WBC, UA 12/27/2021 0-5  0 - 5 WBC/hpf Final   RBC / HPF 12/27/2021 0-5  0 - 5 RBC/hpf Final   Bacteria, UA 12/27/2021 RARE (A)  NONE SEEN Final   Performed at [facility], [place] [address] [place], [place] [address]   POC Amphetamine UR 12/27/2021 None Detected  NONE DETECTED (Cut Off Level 1000 ng/mL) Final   POC Secobarbital (BAR) 12/27/2021 None Detected  NONE DETECTED (Cut Off Level 300 ng/mL) Final   POC Buprenorphine (BUP) 12/27/2021 None Detected  NONE DETECTED (Cut Off Level 10 ng/mL) Final   POC Oxazepam (BZO) 12/27/2021 None Detected  NONE DETECTED (Cut Off Level 300 ng/mL) Final   POC Cocaine UR 12/27/2021 None Detected  NONE DETECTED (Cut Off Level 300 ng/mL) Final  POC Methamphetamine UR 12/27/2021 None Detected  NONE DETECTED (Cut Off Level 1000 ng/mL) Final   POC Morphine 12/27/2021 None Detected   NONE DETECTED (Cut Off Level 300 ng/mL) Final   POC Oxycodone UR 12/27/2021 None Detected  NONE DETECTED (Cut Off Level 100 ng/mL) Final   POC Methadone UR 12/27/2021 None Detected  NONE DETECTED (Cut Off Level 300 ng/mL) Final   POC Marijuana UR 12/27/2021 None Detected  NONE DETECTED (Cut Off Level 50 ng/mL) Final   HIV Screen 4th Generation wRfx 12/27/2021 Non Reactive  Non Reactive Final   Performed at [facility], [place] [address], [place], [place] [address]   SARSCOV2ONAVIRUS 2 AG 12/27/2021 NEGATIVE  NEGATIVE Final   Comment: (NOTE) SARS-CoV-2 antigen NOT DETECTED.   Negative results are presumptive.  Negative results do not preclude SARS-CoV-2 infection and should not be used as the sole basis for treatment or other patient management decisions, including infection  control decisions, particularly in the presence of clinical signs and  symptoms consistent with COVID-19, or in those who have been in contact with the virus.  Negative results must be combined with clinical observations, patient history, and epidemiological information. The expected result is Negative.  Fact Sheet for Patients: HandmadeRecipes.com.cy  Fact Sheet for Healthcare Providers: FuneralLife.at  This test is not yet approved or cleared by the [place] FDA and  has been authorized for detection and/or diagnosis of SARS-CoV-2 by FDA under an Emergency Use Authorization (EUA).  This EUA will remain in effect (meaning this test can be used) for the duration of  the COV                          ID-19 declaration under Section 564(b)(1) of the Act, 21 U.S.C. section 360bbb-3(b)(1), unless the authorization is terminated or revoked sooner.    Office Visit on 12/02/2021  Component Date Value Ref Range Status   Glucose 12/02/2021 90  70 - 99 mg/dL Final   BUN 12/02/2021 8  6 - 24 mg/dL Final   Creatinine, Ser 12/02/2021 1.17  0.76 - 1.27 mg/dL Final    eGFR 12/02/2021 72  >59 mL/min/1.73 Final   BUN/Creatinine Ratio 12/02/2021 7 (L)  9 - 20 Final   Sodium 12/02/2021 135  134 - 144 mmol/L Final   Potassium 12/02/2021 4.7  3.5 - 5.2 mmol/L Final   Chloride 12/02/2021 94 (L)  96 - 106 mmol/L Final   CO2 12/02/2021 25  20 - 29 mmol/L Final   Calcium 12/02/2021 9.7  8.7 - 10.2 mg/dL Final   Total Protein 12/02/2021 8.9 (H)  6.0 - 8.5 g/dL Final   Albumin 12/02/2021 5.3 (H)  3.8 - 4.9 g/dL Final   Globulin, Total 12/02/2021 3.6  1.5 - 4.5 g/dL Final   Albumin/Globulin Ratio 12/02/2021 1.5  1.2 - 2.2 Final   Bilirubin Total 12/02/2021 0.8  0.0 - 1.2 mg/dL Final   Alkaline Phosphatase 12/02/2021 56  44 - 121 IU/L Final   AST 12/02/2021 23  0 - 40 IU/L Final   ALT 12/02/2021 13  0 - 44 IU/L Final   Cholesterol, Total 12/02/2021 295 (H)  100 - 199 mg/dL Final   Triglycerides 12/02/2021 58  0 - 149 mg/dL Final   HDL 12/02/2021 170  >39 mg/dL Final   Comment: Results confirmed on dilution.    VLDL Cholesterol Cal 12/02/2021 8  5 - 40 mg/dL Final   LDL Chol Calc (NIH) 12/02/2021  117 (H)  0 - 99 mg/dL Final   Chol/HDL Ratio 12/02/2021 1.7  0.0 - 5.0 ratio Final   Comment:                                   T. Chol/HDL Ratio                                             Men  Women                               1/2 Avg.Risk  3.4    3.3                                   Avg.Risk  5.0    4.4                                2X Avg.Risk  9.6    7.1                                3X Avg.Risk 23.4   11.0   Office Visit on 08/18/2021  Component Date Value Ref Range Status   Hemoglobin A1C 08/18/2021 5.5  4.0 - 5.6 % Final  Admission on 08/01/2021, Discharged on 08/01/2021  Component Date Value Ref Range Status   Sodium 08/01/2021 131 (L)  135 - 145 mmol/L Final   Potassium 08/01/2021 4.0  3.5 - 5.1 mmol/L Final   Chloride 08/01/2021 94 (L)  98 - 111 mmol/L Final   CO2 08/01/2021 25  22 - 32 mmol/L Final   Glucose, Bld 08/01/2021 110 (H)  70 - 99 mg/dL  Final   Glucose reference range applies only to samples taken after fasting for at least 8 hours.   BUN 08/01/2021 8  6 - 20 mg/dL Final   Creatinine, Ser 08/01/2021 0.97  0.61 - 1.24 mg/dL Final   Calcium 08/01/2021 9.2  8.9 - 10.3 mg/dL Final   Total Protein 08/01/2021 9.0 (H)  6.5 - 8.1 g/dL Final   Albumin 08/01/2021 4.5  3.5 - 5.0 g/dL Final   AST 08/01/2021 69 (H)  15 - 41 U/L Final   ALT 08/01/2021 29  0 - 44 U/L Final   Alkaline Phosphatase 08/01/2021 40  38 - 126 U/L Final   Total Bilirubin 08/01/2021 1.2  0.3 - 1.2 mg/dL Final   GFR, Estimated 08/01/2021 >60  >60 mL/min Final   Comment: (NOTE) Calculated using the CKD-EPI Creatinine Equation (2021)    Anion gap 08/01/2021 12  5 - 15 Final   Performed at [facility], [place] [address] [place], [place] [address]   WBC 08/01/2021 5.8  4.0 - 10.5 K/uL Final   RBC 08/01/2021 5.00  4.22 - 5.81 MIL/uL Final   Hemoglobin 08/01/2021 13.1  13.0 - 17.0 g/dL Final   HCT 08/01/2021 40.2  39.0 - 52.0 % Final   MCV 08/01/2021 80.4  80.0 - 100.0 fL Final   MCH 08/01/2021 26.2  26.0 - 34.0 pg Final   MCHC  08/01/2021 32.6  30.0 - 36.0 g/dL Final   RDW 08/01/2021 16.5 (H)  11.5 - 15.5 % Final   Platelets 08/01/2021 160  150 - 400 K/uL Final   nRBC 08/01/2021 0.0  0.0 - 0.2 % Final   Neutrophils Relative % 08/01/2021 70  % Final   Neutro Abs 08/01/2021 4.1  1.7 - 7.7 K/uL Final   Lymphocytes Relative 08/01/2021 18  % Final   Lymphs Abs 08/01/2021 1.0  0.7 - 4.0 K/uL Final   Monocytes Relative 08/01/2021 9  % Final   Monocytes Absolute 08/01/2021 0.5  0.1 - 1.0 K/uL Final   Eosinophils Relative 08/01/2021 1  % Final   Eosinophils Absolute 08/01/2021 0.0  0.0 - 0.5 K/uL Final   Basophils Relative 08/01/2021 1  % Final   Basophils Absolute 08/01/2021 0.0  0.0 - 0.1 K/uL Final   Immature Granulocytes 08/01/2021 1  % Final   Abs Immature Granulocytes 08/01/2021 0.03  0.00 - 0.07 K/uL Final   Performed at [facility], [place] [address] [place], [place] [address]   Lipase 08/01/2021 73 (H)  11 - 51 U/L Final   Performed at [facility], [place] [address], [place], [place] [address]   Alcohol, Ethyl (B) 08/01/2021 217 (H)  <10 mg/dL Final   Comment: (NOTE) Lowest detectable limit for serum alcohol is 10 mg/dL.  For medical purposes only. Performed at [facility], [place] [address] [place], [place] [address]    Magnesium 08/01/2021 2.1  1.7 - 2.4 mg/dL Final   Performed at [place] [address], [place], [place] [address]  Lab on 07/04/2021  Component Date Value Ref Range Status   Vitamin B1 (Thiamine) 07/04/2021 89 (H)  8 - 30 nmol/L Final   Comment: Marland Kitchen Vitamin supplementation within 24 hours prior to blood draw may affect the accuracy of the results. . This test was developed and its analytical performance characteristics have been determined by [place], [place]. It has not been cleared or approved by the U.S. Food and Drug Administration. This assay has been validated pursuant to the CLIA regulations and is used for clinical purposes. .    Vitamin B-12 07/04/2021 361  211 - 911 pg/mL Final   Folate 07/04/2021 12.2  >5.9 ng/mL Final    Blood Alcohol level:  Lab Results  Component Value Date   ETH 133 (H) 12/27/2021   ETH 217 (H) 53/61/4431    Metabolic Disorder Labs: Lab Results  Component Value Date   HGBA1C 5.6 12/27/2021   MPG 114.02 12/27/2021   MPG 122.63 03/16/2021   No results found for: PROLACTIN Lab Results  Component Value Date   CHOL 295 (H) 12/02/2021   TRIG 58 12/02/2021   HDL 170 12/02/2021   CHOLHDL 1.7 12/02/2021   VLDL 11 03/16/2021   LDLCALC 117 (H) 12/02/2021   LDLCALC NOT CALCULATED 03/16/2021    Therapeutic Lab Levels: No results found for: LITHIUM No results found for: VALPROATE No components found for:  CBMZ  Physical Findings   AIMS     Flowsheet Row Admission (Discharged) from 03/16/2021 in [place] [facility]B Admission (Discharged) from 05/04/2018 in [place] [facility] Admission (Discharged) from 06/29/2017 in [place] 300B  AIMS Total Score 0 0 0      AUDIT    Flowsheet Row Admission (Discharged) from 03/16/2021 in [place] 400B  Admission (Discharged) from 05/04/2018 in [place] 300B Admission (Discharged) from 06/29/2017 in [place] 300B Admission (Discharged) from 09/06/2014 in [place] Admission (Discharged) from 06/01/2014 in [place] 300B  Alcohol Use Disorder Identification Test Final Score (AUDIT) 40 34 35 26 23      [place] Office Visit from 12/02/2021 in [place] Office Visit from 10/10/2021 in [place] Office Visit from 08/18/2021 in [place] 1 Office Visit from 04/07/2021 in [place] 1 Office Visit from 12/19/2017 in [place]  Total GAD-7 Score '13 13 11 8 6      ' PHQ2-9    [place] Office Visit from 12/02/2021 in [place] Office Visit from 10/10/2021 in [place] Office Visit from 08/18/2021 in [place] 1 Office Visit from 04/07/2021 in [facility] ED from 03/16/2021 in [facility]  PHQ-2 Total Score 5 1 0 0 4  PHQ-9 Total Score '15 7 5 3 13      ' Flowsheet Row ED from 12/27/2021 in [facility] ED from 08/01/2021 in [facility] ED from 06/11/2021 in [facility]  C-SSRS RISK CATEGORY No Risk No Risk No Risk        Musculoskeletal  Strength & Muscle  Tone: within normal limits Gait & Station: normal Patient leans: N/A  Psychiatric Specialty Exam  Presentation  General Appearance: Appropriate for Environment; Casual  Eye Contact:Good  Speech:Clear and Coherent; Normal Rate  Speech Volume:Normal  Handedness:Right   Mood and Affect  Mood:Anxious  Affect:Congruent   Thought Process  Thought Processes:Coherent  Descriptions of Associations:Intact  Orientation:Full (Time, Place and Person)  Thought Content:Logical  Diagnosis of Schizophrenia or Schizoaffective disorder in past: No    Hallucinations:Hallucinations: None  Ideas of Reference:None  Suicidal Thoughts:Suicidal Thoughts: No  Homicidal Thoughts:Homicidal Thoughts: No   Sensorium  Memory:Immediate Good; Recent Good; Remote Good  Judgment:Good  Insight:Good   Executive Functions  Concentration:Good  Attention Span:Good  [place] of Knowledge:Good  Language:Good   Psychomotor Activity  Psychomotor Activity:Psychomotor Activity: Normal   Assets  Assets:Communication Skills; Desire for Improvement; Financial Resources/Insurance; Leisure Time; Physical Health; Housing   Sleep  Sleep:Sleep: Good   No data recorded  Physical Exam  Physical Exam Vitals and nursing note reviewed.  Constitutional:      General: He is not in acute distress.    Appearance: Normal appearance. He is well-developed.  HENT:     Head: Normocephalic and atraumatic.  Eyes:     General:        Right eye: No discharge.        Left eye: No discharge.     Conjunctiva/sclera: Conjunctivae normal.  Cardiovascular:     Rate and Rhythm: Normal rate.  Pulmonary:     Effort: Pulmonary effort is normal. No respiratory distress.  Musculoskeletal:        General: Normal range of motion.     Cervical back: Normal range of motion.  Skin:    Coloration: Skin is not jaundiced or pale.  Neurological:     Mental Status: He is alert and oriented to person,  place, and time.  Psychiatric:        Attention and Perception: Attention and perception  normal.        Mood and Affect: Mood is anxious.        Speech: Speech normal.        Behavior: Behavior normal. Behavior is cooperative.        Thought Content: Thought content normal.        Cognition and Memory: Cognition normal.        Judgment: Judgment normal.   Review of Systems  Constitutional: Negative.   HENT: Negative.    Eyes: Negative.   Respiratory: Negative.    Cardiovascular: Negative.   Musculoskeletal: Negative.   Skin: Negative.   Neurological: Negative.   Psychiatric/Behavioral:  The patient is nervous/anxious.   Blood pressure (!) 124/94, pulse 91, temperature 98 F (36.7 C), temperature source Oral, resp. rate 17, SpO2 98 %. There is no height or weight on file to calculate BMI.  Treatment Plan Summary: Daily contact with patient to assess and evaluate symptoms and progress in treatment and Medication management  Disposition: Ongoing-patient continues to meet criteria for treatment in the [facility].  Patient is requesting discharge on Saturday 12/31/2021  Revonda Humphrey, NP 12/29/2021 7:15 PM

## 2021-12-29 NOTE — Clinical Social Work Psych Note (Signed)
LCSW Progress Note  Greggory reports feeling good today, however he shared that his head felt "cloudy". He believes that it is a side effect of his current medications.   Nelton denied having any other issues or complaints.   Per Thomes Lolling, NP the patient is scheduled for discharge tomorrow.   LCSW will continue to follow to address any additional questions, concerns or identified needs.    Radonna Ricker, MSW, LCSW Clinical Education officer, museum ([place]) [facility]

## 2021-12-29 NOTE — Progress Notes (Signed)
Pt is awake, alert and oriented. Pt denies pain but complained of ringing in his ears. No signs of acute distress noted. Administered scheduled meds with no incident. Pt denies current SI/HI/AVH. Staff will monitor for pt's safety.

## 2021-12-29 NOTE — ED Notes (Signed)
Pt refused to come to group today, he stated that he wasn't feeling well

## 2021-12-30 DIAGNOSIS — G629 Polyneuropathy, unspecified: Secondary | ICD-10-CM

## 2021-12-30 MED ORDER — ATORVASTATIN CALCIUM 20 MG PO TABS: 20.0000 mg | ORAL_TABLET | Freq: Every day | ORAL | 0 refills | Status: DC

## 2021-12-30 MED ORDER — ESCITALOPRAM OXALATE 20 MG PO TABS
20.0000 mg | ORAL_TABLET | Freq: Every day | ORAL | 0 refills | Status: DC
Start: 2021-12-31 — End: 2022-03-30
  Filled 2022-01-10: qty 30, 30d supply, fill #0

## 2021-12-30 MED ORDER — TRAZODONE HCL 50 MG PO TABS: 50.0000 mg | ORAL_TABLET | Freq: Every evening | ORAL | 0 refills | Status: DC | PRN

## 2021-12-30 MED ORDER — ADULT MULTIVITAMIN W/MINERALS CH: 1.0000 | ORAL_TABLET | Freq: Every day | ORAL | 0 refills | Status: DC

## 2021-12-30 MED ORDER — GABAPENTIN 100 MG PO CAPS: 100.0000 mg | ORAL_CAPSULE | Freq: Three times a day (TID) | ORAL | 0 refills | Status: DC

## 2021-12-30 MED ORDER — AMLODIPINE BESYLATE 5 MG PO TABS: 5.0000 mg | ORAL_TABLET | Freq: Every day | ORAL | 0 refills | Status: DC

## 2021-12-30 NOTE — Discharge Instructions (Signed)
° °  Please come to [facility] (this facility) during walk in hours for appointment with psychiatrist for further medication management and for therapists for therapy.    Walk in hours are 8-11 AM Monday through Thursday for medication management.Therapy walk in hours are Monday-Wednesday 8 AM-1PM.   It is first come, first -serve; it is best to arrive by 7:00 AM.   On Friday from 1 pm to 4 pm for therapy intake only. Please arrive by 12:00 pm as it is  first come, first -serve.    When you arrive please go upstairs for your appointment. If you are unsure of where to go, inform the front desk that you are here for a walk in appointment and they will assist you with directions upstairs.  Address:  [address] in [place], [place] Ph: [phone]

## 2021-12-30 NOTE — ED Notes (Signed)
Pt in room asleep.  Breathing is unlabored and even. Will continue to monitor for safety.

## 2021-12-30 NOTE — ED Notes (Signed)
Pt asleep in bed. Respirations even and unlabored. Will continue to monitor for safety. ?

## 2021-12-30 NOTE — ED Provider Notes (Signed)
FBC/OBS ASAP Discharge Summary  Date and Time: 12/30/2021 3:30 PM  Name: [name] [name]  MRN:  [id number]   Discharge Diagnoses:  Final diagnoses:  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[place])  Generalized anxiety disorder  Moderate episode of recurrent major depressive disorder ([place])  Alcohol use disorder, moderate, in controlled environment [facility])    Subjective:  Patient seen and chart reviewed-patient has been medication compliant, been appropriate with staff and peers and has been attending groups.  Patient interviewed this morning in the day room.  Patient reports his mood is "much better".  Affect appears brighter.  Patient reports sleeping well and denies issues with appetite.  He denies SI/HI/AVH.  Patient states that he would like to discharge tomorrow morning in order to "make some arrangements".  Patient states that he will call a friend/family member today to make arrangements to be picked up tomorrow.  Discussed with patient that he will probably provided with 7-day samples of medications as well as prescription for 30 days.  Patient verbalized understanding and was in agreement.  Continues to request outpatient resources for substance use disorder.  No other concerns or questions at this time. [place] instructed to notify staff of any needs. . Patient was given the opportunity to ask questions and  All questions answered. Patient verbalized understanding regarding plan of care.    Stay Summary:  61 year old male with history of alcohol use, depression and anxiety who presented to the [facility] on 12/27/2021 for alcohol detox.  EtOH 133; UDS negative. Patient was admitted to the [facility] for continued detox and criss stabilization and was started on ativan taper and CIWA protocol. Patient did not receive any PRN ativan based on scrors during his stay and ativan taper ended the morning of 2/3. Patient was restarted on home medications upon admission which were continued throughout his  stay. Lexapro was increased from 10 mg to 20 mh on 2/1. Patient tolerated increase well without SE/AE. Patient was discharged on 2/4 to self care.   On my interview, patient is in NAD, alert, oriented, calm, cooperative, and attentive, with normal affect, speech, and behavior. Objectively, there is no evidence of psychosis/ mania (able to converse coherently, linear and goal directed thought, no RIS, no distractibility, not pre-occupied, no FOI, etc) nor depression to the point of suicidality (able to concentrate, affect full and reactive, speech normal r/v/t, no psychomotor retardation/agitation, etc).  Overall, patient appears to be at the point, in the absence of inhibiting or disinhibiting symptoms, where he can successfully move to lesser restrictive setting for care. 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   Reflux Dx 2007   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    Past Surgical History:  Procedure Laterality Date   carpel tunnel release Right 07/28/2014    done in [place]  KNEE ARTHROSCOPY     KNEE SURGERY Left    15 years ago   LIPOMA EXCISION N/A 05/10/2016   Procedure: EXCISION OF SCALP LIPOMA;  Surgeon: [name] Hausen, MD;  Location: [facility];  Service: General;  Laterality: N/A;   Family History:  Family History  Problem Relation Age of Onset   Diabetes Father    Hypertension Father     Hypertension Paternal Grandfather    Alcohol abuse Paternal Grandfather    Alcohol abuse Maternal Grandfather    Alcohol abuse Maternal Grandmother    Alcohol abuse Paternal Grandmother    Colon cancer Neg Hx    Rectal cancer Neg Hx    Stomach cancer Neg Hx    Bipolar disorder Neg Hx    Depression Neg Hx    Family Psychiatric History: : alcohol use in maternal and paternal grandparents Social History:  Social History   Substance and Sexual Activity  Alcohol Use Yes   Comment: 1 beer     Social History   Substance and Sexual Activity  Drug Use Not Currently   Types: Marijuana   Comment: Patient denies     Social History   Socioeconomic History   Marital status: Legally Separated    Spouse name: Not on file   Number of children: 2   Years of education: 13 yrs   Highest education level: Not on file  Occupational History   Occupation: custodian    Comment: [employer]  Tobacco Use   Smoking status: Never   Smokeless tobacco: Never  Vaping Use   Vaping Use: Never used  Substance and Sexual Activity   Alcohol use: Yes    Comment: 1 beer   Drug use: Not Currently    Types: Marijuana    Comment: Patient denies    Sexual activity: Not Currently  Other Topics Concern   Not on file  Social History Narrative   Born and raised in [place], [place] by both parents. He has 2 sisters and is the middle child. He remains close with family. He was married for 16 yrs and seperated in 2005. He has 2 boys who are in their 20's now. He lives alone in [place]. He is working as a Charity fundraiser handed    Lives in a one story home    Social Determinants of Radio broadcast assistant Strain: Not on file  Food Insecurity: Not on file  Transportation Needs: Not on file  Physical Activity: Not on file  Stress: Not on file  Social Connections: Not on file   SDOH:  SDOH Screenings   Alcohol Screen: Medium Risk   Last Alcohol Screening Score (AUDIT): 40  Depression  (PHQ2-9): Medium Risk   PHQ-2 Score: 15  Financial Resource Strain: Not on file  Food Insecurity: Not on file  Housing: Not on file  Physical Activity: Not on file  Social Connections: Not on file  Stress: Not on file  Tobacco Use: Low Risk    Smoking Tobacco Use: Never   Smokeless Tobacco Use: Never   Passive Exposure: Not on file  Transportation Needs: Not on file    Tobacco Cessation:  Prescription not provided because: n/a  Current Medications:  Current Facility-Administered Medications  Medication Dose Route Frequency Provider Last Rate Last Admin   acetaminophen (TYLENOL) tablet 650 mg  650 mg Oral Q6H PRN Revonda Humphrey, NP       alum & mag hydroxide-simeth (  MAALOX/MYLANTA) 200-200-20 MG/5ML suspension 30 mL  30 mL Oral Q4H PRN Revonda Humphrey, NP       amLODipine (NORVASC) tablet 5 mg  5 mg Oral Daily Thomes Lolling H, NP   5 mg at 12/30/21 0949   atorvastatin (LIPITOR) tablet 20 mg  20 mg Oral Daily Revonda Humphrey, NP   20 mg at 12/30/21 0950   escitalopram (LEXAPRO) tablet 20 mg  20 mg Oral Daily Ival Bible, MD   20 mg at 12/30/21 0950   gabapentin (NEURONTIN) capsule 100 mg  100 mg Oral TID Revonda Humphrey, NP   100 mg at 12/30/21 0950   magnesium hydroxide (MILK OF MAGNESIA) suspension 30 mL  30 mL Oral Daily PRN Revonda Humphrey, NP       multivitamin with minerals tablet 1 tablet  1 tablet Oral Daily Revonda Humphrey, NP   1 tablet at 12/30/21 6010   thiamine tablet 100 mg  100 mg Oral Daily Revonda Humphrey, NP   100 mg at 12/30/21 0950   traZODone (DESYREL) tablet 50 mg  50 mg Oral QHS PRN Revonda Humphrey, NP   50 mg at 12/29/21 2151   Current Outpatient Medications  Medication Sig Dispense Refill   amLODipine (NORVASC) 5 MG tablet Take 1 tablet (5 mg total) by mouth daily. 30 tablet 0   atorvastatin (LIPITOR) 20 MG tablet Take 1 tablet (20 mg total) by mouth daily. 30 tablet 0   [START ON 12/31/2021] escitalopram (LEXAPRO) 20 MG  tablet Take 1 tablet (20 mg total) by mouth daily. 30 tablet 0   gabapentin (NEURONTIN) 100 MG capsule Take 1 capsule (100 mg total) by mouth 3 (three) times daily. 90 capsule 0   [START ON 12/31/2021] Multiple Vitamin (MULTIVITAMIN WITH MINERALS) TABS tablet Take 1 tablet by mouth daily. 30 tablet 0   traZODone (DESYREL) 50 MG tablet Take 1 tablet (50 mg total) by mouth at bedtime as needed for sleep. 30 tablet 0    PTA Medications: (Not in a hospital admission)   Musculoskeletal  Strength & Muscle Tone: within normal limits Gait & Station: normal Patient leans: N/A  Psychiatric Specialty Exam  Presentation  General Appearance: Appropriate for Environment; Casual  Eye Contact:Good  Speech:Clear and Coherent; Normal Rate  Speech Volume:Normal  Handedness:Right   Mood and Affect  Mood:-- ("much better")  Affect:Appropriate; Congruent; Other (comment) (brighter, appropriate)   Thought Process  Thought Processes:Coherent; Goal Directed; Linear  Descriptions of Associations:Intact  Orientation:Full (Time, Place and Person)  Thought Content:Logical; WDL  Diagnosis of Schizophrenia or Schizoaffective disorder in past: No    Hallucinations:Hallucinations: None  Ideas of Reference:None  Suicidal Thoughts:Suicidal Thoughts: No  Homicidal Thoughts:Homicidal Thoughts: No   Sensorium  Memory:Immediate Good; Recent Good; Remote Good  Judgment:Good  Insight:Good   Executive Functions  Concentration:Good  Attention Span:Good  [place] of Knowledge:Good  Language:Good   Psychomotor Activity  Psychomotor Activity:Psychomotor Activity: Normal   Assets  Assets:Communication Skills; Desire for Improvement; Financial Resources/Insurance; Housing; Social Support; Resilience   Sleep  Sleep:Sleep: Good   No data recorded  Physical Exam  Physical Exam Constitutional:      Appearance: Normal appearance. He is normal weight.  HENT:     Head:  Normocephalic and atraumatic.  Eyes:     Extraocular Movements: Extraocular movements intact.  Pulmonary:     Effort: Pulmonary effort is normal.  Neurological:     General: No focal deficit present.  Mental Status: He is alert and oriented to person, place, and time.  Psychiatric:        Attention and Perception: Attention and perception normal.        Speech: Speech normal.        Behavior: Behavior normal. Behavior is cooperative.        Thought Content: Thought content normal.   Review of Systems  Constitutional:  Negative for chills and fever.  HENT:  Negative for hearing loss.   Eyes:  Negative for discharge and redness.  Respiratory:  Negative for cough.   Cardiovascular:  Negative for chest pain.  Gastrointestinal:  Negative for abdominal pain.  Musculoskeletal:  Negative for myalgias.  Neurological:  Negative for headaches.  Psychiatric/Behavioral:  Positive for substance abuse. Negative for hallucinations and suicidal ideas. The patient is not nervous/anxious and does not have insomnia.   Blood pressure (!) 131/96, pulse 77, temperature (!) 97.1 F (36.2 C), temperature source Temporal, resp. rate 18, SpO2 100 %. There is no height or weight on file to calculate BMI.  Demographic Factors:  Male and Low socioeconomic status  Loss Factors: Financial problems/change in socioeconomic status  Historical Factors: Family history of mental illness or substance abuse and Impulsivity  Risk Reduction Factors:   Sense of responsibility to family, Employed, Living with another person, especially a relative, and Positive social support  Continued Clinical Symptoms:  Severe Anxiety and/or Agitation Depression:   Comorbid alcohol abuse/dependence Alcohol/Substance Abuse/Dependencies  Cognitive Features That Contribute To Risk:  None    Suicide Risk:  Minimal: No identifiable suicidal ideation.  Patients presenting with no risk factors but with morbid ruminations; may be  classified as minimal risk based on the severity of the depressive symptoms  Plan Of Care/Follow-up recommendations:  Activity:  as tolerated Diet:  regular Other:    Take all medications as prescribed by his/her mental healthcare provider. Report any adverse effects and or reactions from the medicines to your outpatient provider promptly. Do not engage in alcohol and or illegal drug use while on prescription medicines. In the event of worsening symptoms, call the crisis hotline, 911 and or go to the nearest ED for appropriate evaluation and treatment of symptoms. follow-up with your primary care provider for your other medical issues, concerns and or health care needs.  Allergies as of 12/30/2021       Reactions   Oxycodone Other (See Comments)   abd pain, stomach cramps         Medication List     STOP taking these medications    gabapentin 600 MG tablet Commonly known as: Neurontin Replaced by: gabapentin 100 MG capsule       TAKE these medications    amLODipine 5 MG tablet Commonly known as: NORVASC Take 1 tablet (5 mg total) by mouth daily.   atorvastatin 20 MG tablet Commonly known as: LIPITOR Take 1 tablet (20 mg total) by mouth daily.   escitalopram 20 MG tablet Commonly known as: LEXAPRO Take 1 tablet (20 mg total) by mouth daily. Start taking on: December 31, 2021 What changed: how much to take   gabapentin 100 MG capsule Commonly known as: NEURONTIN Take 1 capsule (100 mg total) by mouth 3 (three) times daily. Replaces: gabapentin 600 MG tablet   multivitamin with minerals Tabs tablet Take 1 tablet by mouth daily. Start taking on: December 31, 2021   traZODone 50 MG tablet Commonly known as: DESYREL Take 1 tablet (50 mg total) by mouth at bedtime  as needed for sleep. What changed:  medication strength how much to take       Patient was provided with 7 day samples of above medications as well as paper prescriptions for 30 days at time  of  discharge.  Patient was provided with follow up information regarding psychiatric outpatient resources in AVS with the assistance of SW prior to discharge.      Disposition: self care/home-ride to pick him up  Ival Bible, MD 12/30/2021, 3:30 PM

## 2021-12-30 NOTE — ED Notes (Signed)
Patient interacted and communicated in group in a positive manner, and expressed to staff that he is excited about him going to treatment after he is discharged.

## 2021-12-30 NOTE — ED Notes (Signed)
Pt up sitting in day area watching tv. No complaints of pain or discomfort at this time.  Denies HI, SI, and AVH.  When asked about potential discharge planning the pt stated "just let me know when so I can make transportation arrangements."  He did not have any concerns at this time about going home/ being discharged.  Reports he feels better then when he first arrived.  Will continue to monitor for safety.

## 2021-12-30 NOTE — Progress Notes (Addendum)
Patient remains asleep in bed without distress or complaint.  He is for potential discharge home tomorrow a.m. if stable.  Patient is aware of discharge plans.  Will monitor and provide support as needed. Medication is in medication room.

## 2021-12-30 NOTE — ED Notes (Signed)
Pt is currently sleeping, no distress noted, environmental check complete, will continue to monitor patient for safety. ? ?

## 2021-12-30 NOTE — ED Notes (Signed)
Pt is sitting in the dining room eating a snack.

## 2021-12-31 NOTE — Progress Notes (Signed)
[name] [name] woke up on his own, ate breakfast and received his medications. He called his niece and she arrived to pick him up. He received his personal belongings, prescriptions, medications and AVS. His questions were answered and he was escorted to the lobby.

## 2021-12-31 NOTE — ED Notes (Signed)
Pt is currently sleeping, no distress noted, environmental check complete, will continue to monitor patient for safety. ? ?

## 2022-01-09 ENCOUNTER — Ambulatory Visit: Payer: BC Managed Care – PPO

## 2022-01-10 ENCOUNTER — Other Ambulatory Visit: Payer: Self-pay

## 2022-01-10 ENCOUNTER — Encounter: Payer: Self-pay | Admitting: Orthopedic Surgery

## 2022-01-10 ENCOUNTER — Ambulatory Visit (INDEPENDENT_AMBULATORY_CARE_PROVIDER_SITE_OTHER): Payer: BC Managed Care – PPO | Admitting: Orthopedic Surgery

## 2022-01-10 DIAGNOSIS — M1712 Unilateral primary osteoarthritis, left knee: Secondary | ICD-10-CM | POA: Diagnosis not present

## 2022-01-10 DIAGNOSIS — M170 Bilateral primary osteoarthritis of knee: Secondary | ICD-10-CM

## 2022-01-10 DIAGNOSIS — M1711 Unilateral primary osteoarthritis, right knee: Secondary | ICD-10-CM | POA: Diagnosis not present

## 2022-01-10 NOTE — Progress Notes (Signed)
Office Visit Note   Patient: [name] [name]           Date of Birth: 1961/11/26           MRN: [id number] Visit Date: 01/10/2022              Requested by: Gildardo Pounds, NP [employer],  [place] [id number] PCP: Gildardo Pounds, NP  Chief Complaint  Patient presents with   Left Knee - Follow-up    S/p bilateral knee injections 09/20/21   Right Knee - Follow-up    Sch for a right TKR 02/08/22      HPI: Patient is a 61 year old gentleman with osteoarthritis bilateral knees.  Patient states he is currently on short-term disability and would like to postpone having the knee replacements.  Assessment & Plan: Visit Diagnoses:  1. Bilateral primary osteoarthritis of knee     Plan: Both knees were injected he tolerated this well we gave him photographs of his knee x-rays.  He was given a note stating that he does have osteoarthritis of both knees and will need a total knee arthroplasty for both knees.  Follow-Up Instructions: Return if symptoms worsen or fail to improve.   Ortho Exam  Patient is alert, oriented, no adenopathy, well-dressed, normal affect, normal respiratory effort. Examination patient has an antalgic gait with varus alignment to both knees.  He is primarily tender to palpation of the medial joint line bilaterally collaterals and cruciates are stable there is no effusion no cellulitis.  Imaging: No results found. No images are attached to the encounter.  Labs: Lab Results  Component Value Date   HGBA1C 5.6 12/27/2021   HGBA1C 5.5 08/18/2021   HGBA1C 5.9 (H) 03/16/2021   REPTSTATUS 01/27/2016 FINAL 01/26/2016   CULT  11/08/2014    NO GROWTH 5 DAYS Performed at [facility]      Lab Results  Component Value Date   ALBUMIN 4.4 12/27/2021   ALBUMIN 5.3 (H) 12/02/2021   ALBUMIN 4.5 08/01/2021    Lab Results  Component Value Date   MG 2.1 12/27/2021   MG 2.1 08/01/2021   No results found for: VD25OH  No results found for:  PREALBUMIN CBC EXTENDED Latest Ref Rng & Units 12/27/2021 08/01/2021 06/11/2021  WBC 4.0 - 10.5 K/uL 3.8(L) 5.8 7.9  RBC 4.22 - 5.81 MIL/uL 5.23 5.00 5.22  HGB 13.0 - 17.0 g/dL 14.1 13.1 13.7  HCT 39.0 - 52.0 % 43.0 40.2 42.2  PLT 150 - 400 K/uL 170 160 188  NEUTROABS 1.7 - 7.7 K/uL 2.5 4.1 4.7  LYMPHSABS 0.7 - 4.0 K/uL 0.9 1.0 2.3     There is no height or weight on file to calculate BMI.  Orders:  No orders of the defined types were placed in this encounter.  No orders of the defined types were placed in this encounter.    Procedures: Large Joint Inj: bilateral knee on 01/10/2022 2:04 PM Indications: pain and diagnostic evaluation Details: 22 G 1.5 in needle, anteromedial approach  Arthrogram: No  Outcome: tolerated well, no immediate complications Procedure, treatment alternatives, risks and benefits explained, specific risks discussed. Consent was given by the patient. Immediately prior to procedure a time out was called to verify the correct patient, procedure, equipment, support staff and site/side marked as required. Patient was prepped and draped in the usual sterile fashion.     Clinical Data: No additional findings.  ROS:  All other systems negative, except as noted in  the HPI. Review of Systems  Objective: Vital Signs: There were no vitals taken for this visit.  Specialty Comments:  No specialty comments available.  PMFS History: Patient Active Problem List   Diagnosis Date Noted   Neuropathy 12/30/2021   Alcohol dependence with alcohol-induced mood disorder ([place]) 12/27/2021   Essential hypertension 04/07/2021   Prediabetes 04/07/2021   Other hyperlipidemia 04/07/2021   Major depressive disorder, severe ([place]) 03/16/2021   MDD (major depressive disorder), recurrent episode, severe ([place]) 06/30/2017   Lateral epicondylitis of right elbow 01/05/2017   Strain, MCP, hand, right, initial encounter 01/05/2017   Vasomotor rhinitis 02/13/2016   Bilateral chronic  knee pain 01/11/2016   Chronic pain of right wrist 07/07/2015   Carpal tunnel syndrome of right wrist 05/20/2015   Dislocation of right shoulder joint 05/20/2015   Tear of medial meniscus of right knee 03/24/2015   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[place]) 01/27/2015   Chronic cough 01/15/2015   Onychomycosis of toenail 12/15/2014   Dermatitis of face 11/11/2014   GERD (gastroesophageal reflux disease)    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[place]) 01/23/2014   Alcohol abuse 01/19/2014   Alcohol dependence ([place]) 09/02/2013   Generalized anxiety disorder 09/02/2013   Past Medical History:  Diagnosis Date   Alcohol abuse    Anxiety    About 61 years of age   Carpal tunnel syndrome of right wrist Dx 2015   Depression    At 61 years of age   GERD (gastroesophageal reflux disease) Dx 2007   Hepatitis, alcoholic, acute 6283   Hyperlipidemia    borderline, diet controlled   Neuropathy    Pancreatitis Dx 2014   Pneumonia 11/2014   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   Reflux Dx 2007   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    Family History  Problem Relation Age of Onset   Diabetes Father    Hypertension Father    Hypertension Paternal Grandfather    Alcohol abuse Paternal Grandfather    Alcohol abuse Maternal Grandfather    Alcohol abuse Maternal Grandmother    Alcohol abuse Paternal Grandmother    Colon cancer Neg Hx    Rectal cancer Neg Hx    Stomach cancer Neg Hx    Bipolar disorder Neg Hx    Depression Neg Hx     Past Surgical History:  Procedure Laterality Date   carpel tunnel release Right 07/28/2014    done in [facility]     KNEE SURGERY Left    15 years ago   LIPOMA EXCISION N/A 05/10/2016   Procedure: EXCISION OF SCALP LIPOMA;  Surgeon: Johnathan Hausen, MD;  Location: [place];  Service: General;  Laterality: N/A;   Social History   Occupational History   Occupation: custodian     Comment: [employer]  Tobacco Use   Smoking status: Never   Smokeless tobacco: Never  Vaping Use   Vaping Use: Never used  Substance and Sexual Activity   Alcohol use: Yes    Comment: 1 beer   Drug use: Not Currently    Types: Marijuana    Comment: Patient denies    Sexual activity: Not Currently

## 2022-01-13 ENCOUNTER — Ambulatory Visit: Payer: BC Managed Care – PPO | Admitting: Nurse Practitioner

## 2022-02-08 ENCOUNTER — Ambulatory Visit: Admit: 2022-02-08 | Payer: BC Managed Care – PPO | Admitting: Orthopedic Surgery

## 2022-02-08 SURGERY — ARTHROPLASTY, KNEE, TOTAL
Anesthesia: Choice | Site: Knee | Laterality: Right

## 2022-03-27 ENCOUNTER — Other Ambulatory Visit (HOSPITAL_COMMUNITY)
Admission: EM | Admit: 2022-03-27 | Discharge: 2022-03-31 | Disposition: A | Discharge status: Home / Self Care | Payer: No Payment, Other | Attending: Psychiatry | Admitting: Psychiatry

## 2022-03-27 DIAGNOSIS — F10129 Alcohol abuse with intoxication, unspecified: Secondary | ICD-10-CM | POA: Insufficient documentation

## 2022-03-27 DIAGNOSIS — E785 Hyperlipidemia, unspecified: Secondary | ICD-10-CM

## 2022-03-27 DIAGNOSIS — Z20822 Contact with and (suspected) exposure to covid-19: Secondary | ICD-10-CM | POA: Diagnosis not present

## 2022-03-27 DIAGNOSIS — Y909 Presence of alcohol in blood, level not specified: Secondary | ICD-10-CM | POA: Insufficient documentation

## 2022-03-27 DIAGNOSIS — F411 Generalized anxiety disorder: Secondary | ICD-10-CM | POA: Insufficient documentation

## 2022-03-27 DIAGNOSIS — F322 Major depressive disorder, single episode, severe without psychotic features: Secondary | ICD-10-CM | POA: Diagnosis not present

## 2022-03-27 DIAGNOSIS — F1020 Alcohol dependence, uncomplicated: Secondary | ICD-10-CM

## 2022-03-27 LAB — URINALYSIS, COMPLETE (UACMP) WITH MICROSCOPIC
Bilirubin Urine: NEGATIVE
Glucose, UA: NEGATIVE mg/dL
Hgb urine dipstick: NEGATIVE
Ketones, ur: NEGATIVE mg/dL
Leukocytes,Ua: NEGATIVE
Nitrite: NEGATIVE
Protein, ur: NEGATIVE mg/dL
Specific Gravity, Urine: 1.004 — ABNORMAL LOW (ref 1.005–1.030)
pH: 6 (ref 5.0–8.0)

## 2022-03-27 LAB — COMPREHENSIVE METABOLIC PANEL
ALT: 88 U/L — ABNORMAL HIGH (ref 0–44)
AST: 59 U/L — ABNORMAL HIGH (ref 15–41)
Albumin: 4.8 g/dL (ref 3.5–5.0)
Alkaline Phosphatase: 28 U/L — ABNORMAL LOW (ref 38–126)
Anion gap: 13 (ref 5–15)
BUN: 7 mg/dL (ref 6–20)
CO2: 27 mmol/L (ref 22–32)
Calcium: 9.2 mg/dL (ref 8.9–10.3)
Chloride: 96 mmol/L — ABNORMAL LOW (ref 98–111)
Creatinine, Ser: 0.91 mg/dL (ref 0.61–1.24)
GFR, Estimated: >60 mL/min (ref >60)
Glucose, Bld: 83 mg/dL (ref 70–99)
Potassium: 4.1 mmol/L (ref 3.5–5.1)
Sodium: 136 mmol/L (ref 135–145)
Total Bilirubin: 0.6 mg/dL (ref 0.3–1.2)
Total Protein: 8.2 g/dL — ABNORMAL HIGH (ref 6.5–8.1)

## 2022-03-27 LAB — CBC WITH DIFFERENTIAL/PLATELET
Abs Immature Granulocytes: 0.01 10*3/uL (ref 0.00–0.07)
Basophils Absolute: 0 10*3/uL (ref 0.0–0.1)
Basophils Relative: 1 %
Eosinophils Absolute: 0 10*3/uL (ref 0.0–0.5)
Eosinophils Relative: 1 %
HCT: 38.8 % — ABNORMAL LOW (ref 39.0–52.0)
Hemoglobin: 12.9 g/dL — ABNORMAL LOW (ref 13.0–17.0)
Immature Granulocytes: 0 %
Lymphocytes Relative: 39 %
Lymphs Abs: 1.3 10*3/uL (ref 0.7–4.0)
MCH: 27.8 pg (ref 26.0–34.0)
MCHC: 33.2 g/dL (ref 30.0–36.0)
MCV: 83.6 fL (ref 80.0–100.0)
Monocytes Absolute: 0.4 10*3/uL (ref 0.1–1.0)
Monocytes Relative: 13 %
Neutro Abs: 1.4 10*3/uL — ABNORMAL LOW (ref 1.7–7.7)
Neutrophils Relative %: 46 %
Platelets: 202 10*3/uL (ref 150–400)
RBC: 4.64 MIL/uL (ref 4.22–5.81)
RDW: 17.1 % — ABNORMAL HIGH (ref 11.5–15.5)
WBC: 3.2 10*3/uL — ABNORMAL LOW (ref 4.0–10.5)
nRBC: 0 % (ref 0.0–0.2)

## 2022-03-27 LAB — POCT URINE DRUG SCREEN - MANUAL ENTRY (I-SCREEN)
POC Amphetamine UR: NOT DETECTED
POC Buprenorphine (BUP): NOT DETECTED
POC Cocaine UR: NOT DETECTED
POC Marijuana UR: NOT DETECTED
POC Methadone UR: NOT DETECTED
POC Methamphetamine UR: NOT DETECTED
POC Morphine: NOT DETECTED
POC Oxazepam (BZO): NOT DETECTED
POC Oxycodone UR: NOT DETECTED
POC Secobarbital (BAR): NOT DETECTED

## 2022-03-27 LAB — MAGNESIUM: Magnesium: 2.1 mg/dL (ref 1.7–2.4)

## 2022-03-27 LAB — RESP PANEL BY RT-PCR (FLU A&B, COVID) ARPGX2
Influenza A by PCR: NEGATIVE
Influenza B by PCR: NEGATIVE
SARS Coronavirus 2 by RT PCR: NEGATIVE

## 2022-03-27 LAB — HEMOGLOBIN A1C
Hgb A1c MFr Bld: 5.6 % (ref 4.8–5.6)
Mean Plasma Glucose: 114.02 mg/dL

## 2022-03-27 LAB — TSH: TSH: 0.547 u[IU]/mL (ref 0.350–4.500)

## 2022-03-27 LAB — ETHANOL: Alcohol, Ethyl (B): 423 mg/dL (ref <10)

## 2022-03-27 MED ORDER — ADULT MULTIVITAMIN W/MINERALS CH
1.0000 | ORAL_TABLET | Freq: Every day | ORAL | Status: DC
  Administered 2022-03-27 – 2022-03-31 (×5): 1 via ORAL
  Filled 2022-03-27: qty 1
  Filled 2022-03-27: qty 7
  Filled 2022-03-27 (×4): qty 1

## 2022-03-27 MED ORDER — GABAPENTIN 100 MG PO CAPS
100.0000 mg | ORAL_CAPSULE | Freq: Three times a day (TID) | ORAL | Status: DC
  Administered 2022-03-27 – 2022-03-28 (×2): 100 mg via ORAL
  Filled 2022-03-27 (×2): qty 1

## 2022-03-27 MED ORDER — THIAMINE HCL 100 MG/ML IJ SOLN: 100.0000 mg | Freq: Once | INTRAMUSCULAR | Status: DC

## 2022-03-27 MED ORDER — HYDROXYZINE HCL 25 MG PO TABS
25.0000 mg | ORAL_TABLET | Freq: Four times a day (QID) | ORAL | Status: AC | PRN
  Administered 2022-03-27 – 2022-03-28 (×2): 25 mg via ORAL
  Filled 2022-03-27 (×2): qty 1

## 2022-03-27 MED ORDER — TRAZODONE HCL 50 MG PO TABS
50.0000 mg | ORAL_TABLET | Freq: Every evening | ORAL | Status: DC | PRN
  Administered 2022-03-27 – 2022-03-30 (×3): 50 mg via ORAL
  Filled 2022-03-27 (×2): qty 1
  Filled 2022-03-27: qty 7
  Filled 2022-03-27: qty 1

## 2022-03-27 MED ORDER — ONDANSETRON 4 MG PO TBDP: 4.0000 mg | ORAL_TABLET | Freq: Four times a day (QID) | ORAL | Status: AC | PRN

## 2022-03-27 MED ORDER — ACETAMINOPHEN 325 MG PO TABS
650.0000 mg | ORAL_TABLET | Freq: Four times a day (QID) | ORAL | Status: DC | PRN
  Administered 2022-03-30: 650 mg via ORAL
  Filled 2022-03-27: qty 2

## 2022-03-27 MED ORDER — MAGNESIUM HYDROXIDE 400 MG/5ML PO SUSP: 30.0000 mL | Freq: Every day | ORAL | Status: DC | PRN

## 2022-03-27 MED ORDER — LOPERAMIDE HCL 2 MG PO CAPS: 2.0000 mg | ORAL_CAPSULE | ORAL | Status: DC | PRN

## 2022-03-27 MED ORDER — ESCITALOPRAM OXALATE 10 MG PO TABS
20.0000 mg | ORAL_TABLET | Freq: Every day | ORAL | Status: DC
  Administered 2022-03-27 – 2022-03-31 (×5): 20 mg via ORAL
  Filled 2022-03-27 (×5): qty 2
  Filled 2022-03-27: qty 14

## 2022-03-27 MED ORDER — LORAZEPAM 1 MG PO TABS
1.0000 mg | ORAL_TABLET | Freq: Four times a day (QID) | ORAL | Status: DC | PRN
  Administered 2022-03-27 – 2022-03-28 (×2): 1 mg via ORAL
  Filled 2022-03-27 (×2): qty 1

## 2022-03-27 MED ORDER — ALUM & MAG HYDROXIDE-SIMETH 200-200-20 MG/5ML PO SUSP: 30.0000 mL | ORAL | Status: DC | PRN

## 2022-03-27 MED ORDER — ATORVASTATIN CALCIUM 10 MG PO TABS
20.0000 mg | ORAL_TABLET | Freq: Every day | ORAL | Status: DC
  Administered 2022-03-28 – 2022-03-31 (×4): 20 mg via ORAL
  Filled 2022-03-27: qty 14
  Filled 2022-03-27 (×4): qty 2

## 2022-03-27 MED ORDER — THIAMINE HCL 100 MG PO TABS
100.0000 mg | ORAL_TABLET | Freq: Every day | ORAL | Status: DC
  Administered 2022-03-28 – 2022-03-31 (×4): 100 mg via ORAL
  Filled 2022-03-27 (×2): qty 1
  Filled 2022-03-27: qty 7
  Filled 2022-03-27 (×2): qty 1

## 2022-03-27 NOTE — ED Notes (Signed)
Pt is currently sleeping, no distress noted, environmental check complete, will continue to monitor patient for safety. ? ?

## 2022-03-27 NOTE — BH Assessment (Signed)
Comprehensive Clinical Assessment (CCA) Note ? ?03/27/2022 ?[name] [name] ?[id number] ? ?Disposition: Per Thomes Lolling, NP admission to [facility] is recommended.   ? ?The patient demonstrates the following risk factors for suicide: Chronic risk factors for suicide include: substance use disorder. Acute risk factors for suicide include: unemployment and loss (financial, interpersonal, professional). Protective factors for this patient include: positive social support and hope for the future. Considering these factors, the overall suicide risk at this point appears to be low. Patient is appropriate for outpatient follow up. ? ?Patient is a 61 year old male with a history of Alcohol Use Disorder, severe who presents voluntarily to [facility] for assessment.  Patient reports long hx of alcohol use and denies SA treatment outside of recent admission to [facility] in Jan 2023.  He was started on medication to treat underlying depression and anxiety, however he discontinued the medications upon discharge feeling "can beat this with out medicine."  Patient denies SI, HI or AVH.   When asked about his use patterns, patient only shared,  "I am going to state this, too much." Later he reports he has been drinking a 6 pack daily for the past two months.  He denies other substance use.  He denies hx of DT's or withdrawal seizures. Patient is requesting detox and then hopes to enter a 28 day residential SA program.   ? ? ?Chief Complaint:  ?Chief Complaint  ?Patient presents with  ? Addiction Problem  ?  61 year old [name] [name] comes to the [facility] with complaints of alcoholism, anxiety, depression and homelessness. Denied feelings of suicidal/homicidal ideations, denied auditory/visual hallucinations and feeling paranoid.    ? ?Visit Diagnosis: Alcohol Use Disorder, severe ?                            Depressive Disorder Unspecified  ? ?[place] [facility] from 03/27/2022 in [facility] Office Visit from  12/02/2021 in [place] Office Visit from 10/10/2021 in [place]  ?Thoughts that you would be better off dead, or of hurting yourself in some way Not at all Not at all Not at all  ?PHQ-9 Total Score '6 15 7  '$ ? ?  ? ?[place] ED from 12/27/2021 in [facility] ED from 08/01/2021 in [place] DEPT ED from 06/11/2021 in [place] DEPT  ?C-SSRS RISK CATEGORY No Risk No Risk No Risk  ? ?  ? ? ?CCA Screening, Triage and Referral (STR) ? ?Patient Reported Information ?How did you hear about [place]? Self ? ?What Is the Reason for Your Visit/Call Today? 61 year old [name] [name] comes to the [name] with complaints of alcoholism, anxiety, depression and homelessness. Denied feelings of suicidal/homicidal ideations, denied auditory/visual hallucinations and feeling paranoid. When asked how much alcohol are you drinking patient stated, "I am going to state this, too much." Patient reports he takes anxiety and depression medication. ? ?How Long Has This Been Causing You Problems? > than 6 months ? ?What Do You Feel Would Help You the Most Today? Alcohol or Drug Use Treatment ? ? ?Have You Recently Had Any Thoughts About Hurting Yourself? No ? ?Are You Planning to Commit Suicide/Harm Yourself At This time? No ? ? ?Have you Recently Had Thoughts About [place]? No ? ?Are You Planning to Harm Someone at This Time? No ? ?Explanation: No data recorded ? ?  Have You Used Any Alcohol or Drugs in the Past 24 Hours? Yes ? ?How Long Ago Did You Use Drugs or Alcohol? No data recorded ?What Did You Use and How Much? beer ? ? ?Do You Currently Have a Therapist/Psychiatrist? No ? ?Name of Therapist/Psychiatrist: No data recorded ? ?Have You Been Recently Discharged From Any Office Practice or Programs? No ? ?Explanation of Discharge From Practice/Program: No data recorded ? ?  ?CCA  Screening Triage Referral Assessment ?Type of Contact: Face-to-Face ? ?Telemedicine Service Delivery:   ?Is this Initial or Reassessment? No data recorded ?Date Telepsych consult ordered in [facility]:  No data recorded ?Time Telepsych consult ordered in [facility]L:  No data recorded ?Location of Assessment: [facility] Assessment Services ? ?Provider Location: [facility] Assessment Services ? ? ?Collateral Involvement: none ? ? ?Does Patient Have a Stage manager Guardian? No data recorded ?Name and Contact of Legal Guardian: No data recorded ?If Minor and Not Living with Parent(s), Who has Custody? No data recorded ?Is CPS involved or ever been involved? Never ? ?Is APS involved or ever been involved? Never ? ? ?Patient Determined To Be At Risk for Harm To Self or Others Based on Review of Patient Reported Information or Presenting Complaint? No ? ?Method: No data recorded ?Availability of Means: No data recorded ?Intent: No data recorded ?Notification Required: No data recorded ?Additional Information for Danger to Others Potential: No data recorded ?Additional Comments for Danger to Others Potential: No data recorded ?Are There Guns or Other Weapons in [place]? No data recorded ?Types of Guns/Weapons: No data recorded ?Are These Weapons Safely Secured?                            No data recorded ?Who Could Verify You Are Able To Have These Secured: No data recorded ?Do You Have any Outstanding Charges, Pending Court Dates, Parole/Probation? No data recorded ?Contacted To Inform of Risk of Harm To Self or Others: No data recorded ? ? ?Does Patient Present under Involuntary Commitment? No ? ?IVC Papers Initial File Date: No data recorded ? ?[place] of Residence: Kathleen Argue ? ? ?Patient Currently Receiving the Following Services: Not Receiving Services ? ? ?Determination of Need: Urgent (48 hours) ? ? ?Options For Referral: Facility-Based Crisis ? ? ? ? ?CCA Biopsychosocial ?Patient Reported Schizophrenia/Schizoaffective Diagnosis  in Past: No ? ? ?Strengths: Limited insight, seeking SA treatment ? ? ?Mental Health Symptoms ?Depression:   ?None ?  ?Duration of Depressive symptoms:    ?Mania:   ?N/A ?  ?Anxiety:    ?Worrying ?  ?Psychosis:   ?None ?  ?Duration of Psychotic symptoms:    ?Trauma:   ?N/A ?  ?Obsessions:   ?N/A ?  ?Compulsions:   ?N/A ?  ?Inattention:   ?N/A ?  ?Hyperactivity/Impulsivity:   ?N/A ?  ?Oppositional/Defiant Behaviors:   ?N/A ?  ?Emotional Irregularity:   ?Potentially harmful impulsivity ?  ?Other Mood/Personality Symptoms:  No data recorded  ? ?Mental Status Exam ?Appearance and self-care  ?Stature:   ?Average ?  ?Weight:   ?Average weight ?  ?Clothing:   ?Casual ?  ?Grooming:   ?Normal ?  ?Cosmetic use:   ?None ?  ?Posture/gait:   ?Normal ?  ?Motor activity:   ?Tremor ?  ?Sensorium  ?Attention:   ?Normal ?  ?Concentration:   ?Normal ?  ?Orientation:   ?X5 ?  ?Recall/memory:   ?Normal ?  ?Affect and Mood  ?  Affect:   ?Full Range ?  ?Mood:   ?Anxious ?  ?Relating  ?Eye contact:   ?Normal ?  ?Facial expression:   ?Responsive ?  ?Attitude toward examiner:   ?Cooperative ?  ?Thought and Language  ?Speech flow:  ?Clear and Coherent ?  ?Thought content:   ?Appropriate to Mood and Circumstances ?  ?Preoccupation:   ?None ?  ?Hallucinations:   ?None ?  ?Organization:  No data recorded  ?Executive Functions  ?Fund of Knowledge:   ?Average ?  ?Intelligence:   ?Average ?  ?Abstraction:   ?Normal ?  ?Judgement:   ?Fair ?  ?Reality Testing:   ?Adequate ?  ?Insight:   ?Fair ?  ?Decision Making:   ?Impulsive (As evidenced by alcohol use) ?  ?Social Functioning  ?Social Maturity:   ?Impulsive ?  ?Social Judgement:   ?Heedless ?  ?Stress  ?Stressors:   ?Work (Pt is unemployed) ?  ?Coping Ability:   ?Normal ?  ?Skill Deficits:   ?None ?  ?Supports:   ?Friends/Service system; Family (Pt came to [place] to go to [employer]) ?  ? ? ?Religion: ?Religion/Spirituality ?Are You A Religious Person?: No ? ?Leisure/Recreation: ?Leisure /  Recreation ?Do You Have Hobbies?: No ? ?Exercise/Diet: ?Exercise/Diet ?Do You Exercise?: No ?Have You Gained or Lost A Significant Amount of Weight in the Past Six Months?: Yes-Lost ?Do You Follow a Special Diet?: No

## 2022-03-27 NOTE — ED Provider Notes (Signed)
Facility Based Crisis Admission H&P ? ?Date: 03/27/22 ?Patient Name: [name] [name] ?MRN: [id number] ?Chief Complaint:  ?Chief Complaint  ?Patient presents with  ? Addiction Problem  ?  61 year old [name] [name] comes to the [facility] with complaints of alcoholism, anxiety, depression and homelessness. Denied feelings of suicidal/homicidal ideations, denied auditory/visual hallucinations and feeling paranoid.    ?   ? ?Diagnoses:  ?Final diagnoses:  ?Alcohol abuse with intoxication ([place])  ? ? ?HPI: [name] 61 y.o., male patient presented to [facility] as a walk in alone requesting alcohol detox and seeking residential treatment. ? ?[name], 61 y.o., male patient seen face to face by this provider, consulted with Dr. Dwyane Dee; and chart reviewed on 03/27/22. Per chart review patient  has a past psychiatric history of MDD, GAD, and alcohol use disorder. He was admitted to the [facility] from 12/27/2021 to 12/30/2021. He declined residential treatment at that time and was discharged with the following medications: amlodipine 5 mg QD, Atorvastatin 20 mg QD, Lexapro 20 mg QD, Gabapentin 100 mg TID, Multivitamin, Trazodone 50 mg QHS PRN. Reports he rarely takes medications because he does not want to mix alcohol with his medications. He has no outpatient psychiatric services in place.  ? ?On evaluation [name] reports after his discharged he was sober for roughly one month since that time he has been drinking at least 6 beers per day, every day. When asked about his use patterns, patient only shared,  "I am going to state this, too much." He denies using liquor or any other substance. His last drink was before he came to [facility]. He denies any history of alcohol withdrawal seizures or DT's.  ? ?During evaluation [name] is observed walking to the assessment unit, he smells like alcohol. He speech is slurred. His is alert, oriented x 4, and cooperative. He is irritable while waiting, states he needs some food and water. He is  inpatient but attentive. He endorses depression with increased feelings of hopelessness, low self esteem, decreased appetite. Low energy  and sleep.  PHQ9 Score is 13.  Objectively there is no evidence of psychosis/mania or delusional thinking.  He denies AVH. He also denies suicidal/self-harm/homicidal ideation, psychosis, and paranoia.   ? ?PHQ 2-9:  ?[employer] from 12/02/2021 in [place] Office Visit from 10/10/2021 in [facility] Office Visit from 08/18/2021 in [place] [facility]  ?Thoughts that you would be better off dead, or of hurting yourself in some way Not at all Not at all Not at all  ?PHQ-9 Total Score '15 7 5  '$ ? ?  ?  ?[place] ED from 12/27/2021 in [facility] ED from 08/01/2021 in [place] DEPT ED from 06/11/2021 in [place] DEPT  ?C-SSRS RISK CATEGORY No Risk No Risk No Risk  ? ?  ?  ? ?Total Time spent with patient: 30 minutes ? ?Musculoskeletal  ?Strength & Muscle Tone: within normal limits ?Gait & Station: normal ?Patient leans: N/A ? ?Psychiatric Specialty Exam  ?Presentation ?General Appearance: Casual ? ?Eye Contact:Fair ? ?Speech:Slurred ? ?Speech Volume:Normal ? ?Handedness:Right ? ? ?Mood and Affect  ?Mood:Anxious ? ?Affect:Congruent ? ? ?Thought Process  ?Thought Processes:Coherent ? ?Descriptions of Associations:Intact ? ?Orientation:Full (Time, Place and Person) ? ?Thought Content:Logical ? Diagnosis of Schizophrenia or Schizoaffective disorder in past: No ?  ?Hallucinations:Hallucinations: None ? ?Ideas of Reference:None ? ?Suicidal Thoughts:Suicidal Thoughts: No ? ?Homicidal  Thoughts:Homicidal Thoughts: No ? ? ?Sensorium  ?Memory:Immediate Good; Recent Good; Remote Good ? ?Judgment:Fair ? ?Insight:Fair ? ? ?Executive Functions  ?Concentration:Fair ? ?Attention Span:Fair ? ?Recall:Good ? ?Fund of  [place] ? ?Language:Good ? ? ?Psychomotor Activity  ?Psychomotor Activity:Psychomotor Activity: Normal ? ? ?Assets  ?Assets:Physical Health; Resilience; Social Support ? ? ?Sleep  ?Sleep:Sleep: Poor ? ? ?Nutritional Assessment (For OBS and FBC admissions only) ?Has the patient had a weight loss or gain of 10 pounds or more in the last 3 months?: Yes ?Has the patient had a decrease in food intake/or appetite?: Yes ?Does the patient have dental problems?: No ?Does the patient have eating habits or behaviors that may be indicators of an eating disorder including binging or inducing vomiting?: No ?Has the patient recently lost weight without trying?: 2.0 ?Has the patient been eating poorly because of a decreased appetite?: 1 ?Malnutrition Screening Tool Score: 3 ? ? ? ?Physical Exam ?Vitals and nursing note reviewed.  ?Constitutional:   ?   General: He is not in acute distress. ?   Appearance: Normal appearance. He is well-developed.  ?HENT:  ?   Head: Normocephalic.  ?Eyes:  ?   General:     ?   Right eye: No discharge.     ?   Left eye: No discharge.  ?   Conjunctiva/sclera: Conjunctivae normal.  ?Cardiovascular:  ?   Rate and Rhythm: Normal rate.  ?Pulmonary:  ?   Effort: Pulmonary effort is normal. No respiratory distress.  ?Musculoskeletal:     ?   General: Normal range of motion.  ?   Cervical back: Normal range of motion.  ?Skin: ?   Coloration: Skin is not jaundiced or pale.  ?Neurological:  ?   Mental Status: He is alert and oriented to person, place, and time.  ?Psychiatric:     ?   Attention and Perception: Attention and perception normal.     ?   Mood and Affect: Mood is anxious.     ?   Speech: Speech is slurred.     ?   Behavior: Behavior is cooperative.     ?   Thought Content: Thought content normal.     ?   Cognition and Memory: Cognition normal.     ?   Judgment: Judgment is impulsive.  ? ?Review of Systems  ?Constitutional: Negative.   ?HENT: Negative.    ?Eyes: Negative.   ?Respiratory:  Negative.    ?Cardiovascular: Negative.   ?Musculoskeletal: Negative.   ?Skin: Negative.   ?Neurological: Negative.   ?Psychiatric/Behavioral:  Positive for depression and substance abuse. The patient is nervous/anxious.   ? ?Blood pressure (!) 116/92, pulse 89, temperature 98.2 ?F (36.8 ?C), temperature source Oral, resp. rate 18. There is no height or weight on file to calculate BMI. ? ?Past Psychiatric History: MDD, GAD, Alcohol use disorder   ? ?Is the patient at risk to self? No  ?Has the patient been a risk to self in the past 6 months? No .    ?Has the patient been a risk to self within the distant past? No   ?Is the patient a risk to others? No   ?Has the patient been a risk to others in the past 6 months? No   ?Has the patient been a risk to others within the distant past? No  ? ?Past Medical History:  ?Past Medical History:  ?Diagnosis Date  ? Alcohol abuse   ? Anxiety   ? About 61 years  of age  ? Carpal tunnel syndrome of right wrist Dx 2015  ? Depression   ? At 61 years of age  ? GERD (gastroesophageal reflux disease) Dx 2007  ? Hepatitis, alcoholic, acute 6759  ? Hyperlipidemia   ? borderline, diet controlled  ? Neuropathy   ? Pancreatitis Dx 2014  ? Pneumonia 11/2014  ? 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 Reflux Dx 2007  ? 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  ?Past Surgical History:  ?Procedure Laterality Date  ? carpel tunnel release Right 07/28/2014   ? done in [place]   ? ELBOW SURGERY    ? KNEE ARTHROSCOPY    ? KNEE SURGERY Left   ? 15 years ago  ? LIPOMA EXCISION N/A 05/10/2016  ? Procedure: EXCISION OF SCALP LIPOMA;  Surgeon: Johnathan Hausen, MD;  Location: [place];  Service: General;  Laterality: N/A;  ? ? ?Family History:  ?Family History  ?Problem Relation Age of Onset  ? Diabetes Father   ? Hypertension Father   ? Hypertension Paternal Grandfather   ? Alcohol abuse Paternal Grandfather   ? Alcohol abuse Maternal Grandfather   ? Alcohol abuse Maternal  Grandmother   ? Alcohol abuse Paternal Grandmother   ? Colon cancer Neg Hx   ? Rectal cancer Neg Hx   ? Stomach cancer Neg Hx   ? Bipolar disorder Neg Hx   ? Depression Neg Hx   ? ? ?Social History:  ?Social History  ? ?Socioeconomic Hi

## 2022-03-27 NOTE — ED Notes (Signed)
Pt. Is in the dining room speaking with the RN Seria.  ?

## 2022-03-27 NOTE — ED Notes (Signed)
Pt. Is eating a snack.  ?

## 2022-03-27 NOTE — Progress Notes (Signed)
?   03/27/22 1414  ?[place] Triage Screening (Walk-ins at [facility] only)  ?How Did You Hear About [place]? Self  ?What Is the Reason for Your Visit/Call Today? 62 year old [name] [name] comes to the [facility] with complaints of alcoholism, anxiety, depression and homelessness. Denied feelings of suicidal/homicidal ideations, denied auditory/visual hallucinations and feeling paranoid. When asked how much alcohol are you drinking patient stated, "I am going to state this, too much." Patient reports he takes anxiety and depression medication.  ?How Long Has This Been Causing You Problems? > than 6 months  ?Have You Recently Had Any Thoughts About Hurting Yourself? No  ?Are You Planning to Commit Suicide/Harm Yourself At This time? No  ?Have you Recently Had Thoughts About [place]? No  ?Are You Planning To Harm Someone At This Time? No  ?Are you currently experiencing any auditory, visual or other hallucinations? No  ?Have You Used Any Alcohol or Drugs in the Past 24 Hours? Yes  ?How long ago did you use Drugs or Alcohol? Patient report he last drank alcohol today 03/27/2022.  ?What Did You Use and How Much? beer  ?Do you have any current medical co-morbidities that require immediate attention? No  ?Clinician description of patient physical appearance/behavior: Patient is dressed warm for the weather, affect is pleasant and cooperative.  ?What Do You Feel Would Help You the Most Today? Alcohol or Drug Use Treatment;Housing Assistance;Stress Management  ?If access to [facility] was not available, would you have sought care in the Emergency Department? No  ?Determination of Need Routine (7 days)  ?Options For Referral Outpatient Therapy  ? ? ?

## 2022-03-27 NOTE — ED Notes (Signed)
Pts night time medications have been given. Pt is now in his room in bed, no distress noted, will continue to monitor patient for safety ?

## 2022-03-27 NOTE — ED Notes (Signed)
Pt has been brought on the unit, pt has been here previously therefore he is already familiar with unit. Pt is in dining room eating at the moment. ?

## 2022-03-27 NOTE — ED Notes (Signed)
Pt. Is eating at the moment in the dining room.  ?

## 2022-03-28 DIAGNOSIS — F322 Major depressive disorder, single episode, severe without psychotic features: Secondary | ICD-10-CM | POA: Diagnosis not present

## 2022-03-28 DIAGNOSIS — F10129 Alcohol abuse with intoxication, unspecified: Secondary | ICD-10-CM | POA: Diagnosis not present

## 2022-03-28 DIAGNOSIS — F411 Generalized anxiety disorder: Secondary | ICD-10-CM | POA: Diagnosis not present

## 2022-03-28 DIAGNOSIS — Z20822 Contact with and (suspected) exposure to covid-19: Secondary | ICD-10-CM | POA: Diagnosis not present

## 2022-03-28 LAB — LIPID PANEL
Cholesterol: 400 mg/dL — ABNORMAL HIGH (ref 0–200)
HDL: >135 mg/dL (ref >40)
Triglycerides: 61 mg/dL (ref <150)
VLDL: 12 mg/dL (ref 0–40)

## 2022-03-28 MED ORDER — LORAZEPAM 1 MG PO TABS
1.0000 mg | ORAL_TABLET | Freq: Every day | ORAL | Status: AC
  Administered 2022-03-31: 1 mg via ORAL
  Filled 2022-03-28: qty 1

## 2022-03-28 MED ORDER — GABAPENTIN 100 MG PO CAPS
200.0000 mg | ORAL_CAPSULE | Freq: Three times a day (TID) | ORAL | Status: DC
  Administered 2022-03-28 – 2022-03-31 (×9): 200 mg via ORAL
  Filled 2022-03-28 (×7): qty 2
  Filled 2022-03-28: qty 42
  Filled 2022-03-28 (×2): qty 2

## 2022-03-28 MED ORDER — LORAZEPAM 1 MG PO TABS
1.0000 mg | ORAL_TABLET | Freq: Three times a day (TID) | ORAL | Status: AC
  Administered 2022-03-29 (×3): 1 mg via ORAL
  Filled 2022-03-28 (×3): qty 1

## 2022-03-28 MED ORDER — LOPERAMIDE HCL 2 MG PO CAPS: 2.0000 mg | ORAL_CAPSULE | ORAL | Status: DC | PRN

## 2022-03-28 MED ORDER — LORAZEPAM 1 MG PO TABS
1.0000 mg | ORAL_TABLET | Freq: Four times a day (QID) | ORAL | Status: AC
  Administered 2022-03-28 (×3): 1 mg via ORAL
  Filled 2022-03-28 (×3): qty 1

## 2022-03-28 MED ORDER — LORAZEPAM 1 MG PO TABS
1.0000 mg | ORAL_TABLET | Freq: Two times a day (BID) | ORAL | Status: AC
  Administered 2022-03-30 (×2): 1 mg via ORAL
  Filled 2022-03-28 (×2): qty 1

## 2022-03-28 MED ORDER — LORAZEPAM 1 MG PO TABS: 1.0000 mg | ORAL_TABLET | Freq: Four times a day (QID) | ORAL | Status: DC | PRN

## 2022-03-28 NOTE — ED Notes (Signed)
Pt asleep in bed. Respirations even and unlabored. Will continue to monitor for safety. ?

## 2022-03-28 NOTE — ED Notes (Signed)
Pt is currently sleeping, no distress noted, environmental check complete, will continue to monitor patient for safety. ? ?

## 2022-03-28 NOTE — Progress Notes (Signed)
Received Rose this AM at the nurses station being medicated. He returned to his room and later got up to eat breakfast. He stated his appetite is poor. He verbalized several withdrawal complaints, and was scored a ten on the CIWA scale.He was medicated per order along with this writers assessment.  He denied feeling suicidal this AM, but endorsed feeling very anxious. His affect is somewhat distressed.  ?

## 2022-03-28 NOTE — ED Notes (Signed)
Pt came to nurses station asking for medication, the patient has c/o feeling shaky and nervous. This nurse advised patient he does have as needed vistaril for his symptoms.  ?

## 2022-03-28 NOTE — ED Notes (Signed)
Pt sitting in dining room watching TV. A&O x4, calm and cooperative. Denies SI/HI/AVH. Denies any current needs. No signs of acute distress noted. Will continue to monitor for safety.  ?

## 2022-03-28 NOTE — Progress Notes (Signed)
[name] [name] has been OOB for meals without complications. He has been medicated per order and assessed. CIWA monitored. ?

## 2022-03-28 NOTE — ED Notes (Signed)
Pt. Is watching TV and was just offered a snack and juice.  ?

## 2022-03-28 NOTE — ED Provider Notes (Signed)
Behavioral Health Progress Note ? ?Date and Time: 03/28/2022 12:16 PM ?Name: [name] [name] ?MRN:  [id number] ? ?Subjective:   ?61 yo male with history of MDD, anxiety, AUD who presented to the [facility] on 03/27/22 for detox; he was admitted to the [facility] for detox and crisis stabilization. Etoh 423; UDS negative.  ? ?Per chart review patient  has a past psychiatric history of MDD, GAD, and alcohol use disorder. He was admitted to the [facility] from 12/27/2021 to 12/30/2021. He declined residential treatment at that time and was discharged with the following medications: amlodipine 5 mg QD, Atorvastatin 20 mg QD, Lexapro 20 mg QD, Gabapentin 100 mg TID, Multivitamin, Trazodone 50 mg QHS PRN ? ?Patient seen and chart reviewed. Patient with 2 CIWAs of 10 over the past 24 hours. On interview, patient is visibly tremuous and has difficulty putting his socks on. Patient reports reason for presentation and per H&P. He states that he is currently expereincing alcohol withdrawal sx of palpitations, tremors, nausea. He denies vomiting, diarrhea, sweating. He denies SI/HI/AVH. He states that once he has finished detox that he would like to attend residential treatment. ? ?He states that he has been diagnosed with anxiety and depression in the past. He states that he copes with anxiety and depression with substance use. He states that he relapsed ~1 month ago. He is unable to identify any particular trigger for relapse but states that he does not like his job and quit around this time and cites housing issues as another stressor. He states that he has been staying with his parents and uncle "off and on". He states that when he relapses, he does not take his psychiatric medication as he has been told not to drink while on medications and reports that he has been non compliant for ~1 month. He denies SI/HI/AVH/paranoia.  ? ?Psychoeducation provided regarding alcohol use, mood and anxiety. Educated patient on [place]. Discussed with patient that due to  his elevated CIWA scores and current alcohol withdrawal sx that the current recommendation is to start an ativan taper --patient verbalized understanding and agreement. Discussed increasing gabapentin dose of 100 mg TID to 200 mg TID for alcohol withdrawal, neuropathic pain--patient verbalize understanding and agreement. ? ?Obtained from patient interview and chart review ?Past Psychiatric History: ?Previous Medication Trials: zoloft, buspar ?Previous Psychiatric Hospitalizations: denied psychiatric hospitalizations but states he has had been admitted for detox--has been admitted to the [facility] in 11/2021 ?Previous Suicide Attempts: denies ?History of Violence: denies ?Outpatient psychiatrist: no ? 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 ?Substance Use (with emphasis over the last 12 months) ?Recreational Drugs: alcohol ?Use of Alcohol: heavy ?Tobacco Use: denied ?Rehab History: yes, states that he has attended ~12 rehabs ?H/O Complicated Withdrawal: no ? ?Legal History: ?Pending charges: denied ? ?Family Psychiatric History: ?Brother with alcohol use  ?Cousin with completed suicide ?Niece with bipolar disorder  ? ? ? ? ? ?Diagnosis:  ?Final diagnoses:  ?Alcohol abuse with intoxication ([place])  ?Generalized anxiety disorder  ?Major depressive disorder, severe ([place])  ?Alcohol use disorder, severe, in controlled environment [facility])  ? ? ?Total Time spent with patient: 30 minutes ? ?Past Psychiatric History: MDD, GAD, AUD, SIMD ?Past Medical History:  ?Past Medical History:  ?Diagnosis Date  ?  Alcohol abuse   ? Anxiety   ? About 61 years of age  ? Carpal tunnel syndrome of right wrist Dx 2015  ? Depression   ? At 61 years of age  ?  GERD (gastroesophageal reflux disease) Dx 2007  ? Hepatitis, alcoholic, acute 2706  ? Hyperlipidemia   ? borderline, diet controlled  ? Neuropathy   ? Pancreatitis Dx 2014  ? Pneumonia 11/2014  ? 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 Reflux Dx 2007  ? 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  ?Past Surgical History:  ?Procedure Laterality Date  ? carpel tunnel release Right 07/28/2014   ? done in [place]   ? ELBOW SURGERY    ? KNEE ARTHROSCOPY    ? KNEE SURGERY Left   ? 15 years ago  ? LIPOMA EXCISION N/A 05/10/2016  ? Procedure: EXCISION OF SCALP LIPOMA;  Surgeon: Johnathan Hausen, MD;  Location: [place];  Service: General;  Laterality: N/A;  ? ?Family History:  ?Family History  ?Problem Relation Age of Onset  ? Diabetes Father   ? Hypertension Father   ? Hypertension Paternal Grandfather   ? Alcohol abuse Paternal Grandfather   ? Alcohol abuse Maternal Grandfather   ? Alcohol abuse Maternal Grandmother   ? Alcohol abuse Paternal Grandmother   ? Colon cancer Neg Hx   ? Rectal cancer Neg Hx   ? Stomach cancer Neg Hx   ? Bipolar disorder Neg Hx   ? Depression Neg Hx   ? ?Family Psychiatric  History:  ?Brother with alcohol use  ?Cousin with completed suicide ?Niece with bipolar disorder  ? ?Social History:  ?Social History  ? ?Substance and Sexual Activity  ?Alcohol Use Yes  ? Comment: 1 beer  ?   ?Social History  ? ?Substance and Sexual Activity  ?Drug Use Not Currently  ? Types: Marijuana  ? Comment: Patient denies   ?  ?Social History  ? ?Socioeconomic History  ? Marital status: Legally Separated  ?  Spouse name: Not on file  ? Number of children: 2  ? Years of education: 13 yrs  ? Highest education level: Not on file  ?Occupational History  ? Occupation: custodian  ?  Comment: [employer]  ?Tobacco Use  ? Smoking status: Never  ? Smokeless tobacco: Never  ?Vaping Use  ? Vaping Use: Never used  ?Substance and Sexual Activity  ? Alcohol use: Yes  ?  Comment: 1 beer  ? Drug use: Not  Currently  ?  Types: Marijuana  ?  Comment: Patient denies   ? Sexual activity: Not Currently  ?Other Topics Concern  ? Not on file  ?Social History Narrative  ? Born and raised in [place], [place] by both parents. He has 2 sisters and is the middle child. He remains close with family. He was married for 16 yrs and seperated in 2005. He has 2 boys who are in their 20's now. He lives alone in [place]. He is working as a Retail buyer  ?   ?   ? Right handed   ? Lives in a one story home   ? ?Social Determinants of Health  ? ?Financial Resource Strain: Not on file  ?Food Insecurity: Not on file  ?Transportation Needs: Not on file  ?Physical Activity: Not on file  ?Stress: Not on file  ?Social Connections: Not on file  ? ?SDOH:  ?SDOH Screenings  ? ?Alcohol Screen: Not on file  ?Depression (PHQ2-9): Medium Risk  ?  PHQ-2 Score: 13  ?Financial Resource Strain: Not on file  ?Food Insecurity: Not on file  ?Housing: Not on file  ?Physical Activity: Not on file  ?Social Connections: Not on file  ?Stress: Not on file  ?Tobacco Use: Low Risk   ? Smoking Tobacco Use: Never  ? Smokeless Tobacco Use: Never  ? Passive Exposure: Not on file  ?Transportation Needs: Not on file  ? ?Additional Social History:  ?  ?Pain Medications: Please see MAR ?Prescriptions: Please see MAR ?Over the Counter: Please see MAR ?History of alcohol / drug use?: Yes ?Longest period of sobriety (when/how long): 1 year ?Negative Consequences of Use: Work / Youth worker, Museum/gallery curator ?Withdrawal Symptoms: Tingling ?  ?  ?  ?  ?  ?  ?  ?  ?  ? ?Sleep: Poor ? ?Appetite:  Fair ? ?Current Medications:  ?Current Facility-Administered Medications  ?Medication Dose Route Frequency Provider Last Rate Last Admin  ? acetaminophen (TYLENOL) tablet 650 mg  650 mg Oral Q6H PRN Revonda Humphrey, NP      ? alum & mag hydroxide-simeth (MAALOX/MYLANTA) 200-200-20 MG/5ML suspension 30 mL  30 mL Oral Q4H PRN Revonda Humphrey, NP      ? atorvastatin (LIPITOR) tablet 20 mg  20 mg Oral Daily  Thomes Lolling H, NP   20 mg at 03/28/22 1017  ? escitalopram (LEXAPRO) tablet 20 mg  20 mg Oral Daily Revonda Humphrey, NP   20 mg at 03/28/22 1018  ? gabapentin (NEURONTIN) capsule 200 mg  200 mg Or

## 2022-03-29 ENCOUNTER — Encounter (HOSPITAL_COMMUNITY): Payer: Self-pay

## 2022-03-29 DIAGNOSIS — F411 Generalized anxiety disorder: Secondary | ICD-10-CM | POA: Diagnosis not present

## 2022-03-29 DIAGNOSIS — F322 Major depressive disorder, single episode, severe without psychotic features: Secondary | ICD-10-CM | POA: Diagnosis not present

## 2022-03-29 DIAGNOSIS — F10129 Alcohol abuse with intoxication, unspecified: Secondary | ICD-10-CM | POA: Diagnosis not present

## 2022-03-29 DIAGNOSIS — Z20822 Contact with and (suspected) exposure to covid-19: Secondary | ICD-10-CM | POA: Diagnosis not present

## 2022-03-29 LAB — MISC LABCORP TEST (SEND OUT): Labcorp test code: 303756

## 2022-03-29 MED ORDER — DM-GUAIFENESIN ER 30-600 MG PO TB12
1.0000 | ORAL_TABLET | Freq: Two times a day (BID) | ORAL | Status: DC
  Administered 2022-03-29 – 2022-03-31 (×5): 1 via ORAL
  Filled 2022-03-29: qty 1
  Filled 2022-03-29: qty 10
  Filled 2022-03-29 (×4): qty 1

## 2022-03-29 NOTE — Progress Notes (Signed)
[name] [name] took a nap after lunch. He relocated back to the dinning room with his peers watching TV. His back pack was retrieved to locate his phone. He was unable to locate his phone and requested his wallet. His request was granted. His affect remains calm and bright. ?

## 2022-03-29 NOTE — Progress Notes (Signed)
Received [name] [name] this AM asleep in his bed,he woke up and ate breakfast. He remained in the dining room watching the TV. He verbalized feeling better today, less anxious and tremors. His affect is brighter today.   ?

## 2022-03-29 NOTE — Progress Notes (Cosign Needed)
Behavioral Health Progress Note ? ?Date and Time: 03/29/2022 11:26 AM ?Name: [name] [name] ?MRN:  [id number] ? ?Subjective: " I feel a little better" patient reports feeling better than yesterday, decreased tremors.  ? ?[name] [name], is a 61 years old male, African american, with a history of Alcohol Use Disorder, severe who presents voluntarily to [facility] for assessment.  Patient reports long hx of alcohol use and denies SA treatment outside of recent admission to [facility] in Jan 2023.  He was started on medication to treat underlying depression and anxiety, however he discontinued the medications upon discharge.  ? ?On interview today patient is  calm, cooperative and pleasant. Patient  reported he had a good night, he estimates slept about five hours after  taking  Trazodone. He reported waking up sometimes during  the night. ? ?He stated his appetite is improving, he had breakfast today. Patient denied any suicidal ideation, hallucinations, thought of harm himself or others. He rated anxiety 5/10, depression 7/10, anger 0/10 being 10 great severity. His anxiety is constant every day, his depression " comes and goes". He reported having some cravings for alcohol. ? ?Patient is compliant with medications, he is currently taking Lexapro and Gabapentin and  reported nauseas that can be considered side effect of the medications. It was explained to him that he can ask for anti nausea medication because it is ordered as needed.  ? ?It was reported to the patient about his hight blood pressure this morning (152/98) He sated his PCP stopped his antihypertensive medications because the blood pressure was normal. Patient reported being diagnose with pre diabetes and request setting up an appointment with his PCP. ?Also, he wants to see a psychiatric  on regular basis but doesn't have insurance.LCSW explained to him the ability to come to this clinic for Psychiatric appointment without insurance.  ? ?Patient is  going to decide if he wants to go home or to long term facility [facility]. He prefers going home  ?with his grandfather [place],  away of " bad community influence and bars". ? ? ?Diagnosis:  ?Final diagnoses:  ?Alcohol abuse with intoxication ([place])  ?Generalized anxiety disorder  ?Major depressive disorder, severe ([place])  ?Alcohol use disorder, severe, in controlled environment [facility])  ? ? ?Total Time spent with patient: 30 minutes ? ?Past Psychiatric History:  ?Past Medical History:  ?Past Medical History:  ?Diagnosis Date  ? Alcohol abuse   ? Anxiety   ? About 61 years of age  ? Carpal tunnel syndrome of right wrist Dx 2015  ? Depression   ? At 61 years of age  ? GERD (gastroesophageal reflux disease) Dx 2007  ? Hepatitis, alcoholic, acute 1324  ? Hyperlipidemia   ? borderline, diet controlled  ? Neuropathy   ? Pancreatitis Dx 2014  ? Pneumonia 11/2014  ? 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 Reflux Dx 2007  ? 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  ?Past Surgical History:  ?Procedure Laterality Date  ? carpel tunnel release Right 07/28/2014   ? done in [place]   ? ELBOW SURGERY    ? KNEE ARTHROSCOPY    ? KNEE SURGERY Left   ? 15 years ago  ? LIPOMA EXCISION N/A 05/10/2016  ? Procedure: EXCISION OF SCALP LIPOMA;  Surgeon: [name] Hausen, MD;  Location: [place];  Service: General;  Laterality: N/A;  ? ?Family History:  ?Family History  ?Problem Relation Age of Onset  ?  Diabetes Father   ? Hypertension Father   ? Hypertension Paternal Grandfather   ? Alcohol abuse Paternal Grandfather   ? Alcohol abuse Maternal Grandfather   ? Alcohol abuse Maternal Grandmother   ? Alcohol abuse Paternal Grandmother   ? Colon cancer Neg Hx   ? Rectal cancer Neg Hx   ? Stomach cancer Neg Hx   ? Bipolar disorder Neg Hx   ? Depression Neg Hx   ? ?Family Psychiatric  History: ?Social History:  ?Social History  ? ?Substance and Sexual Activity  ?Alcohol Use Yes  ? Comment: 1 beer  ?   ?Social  History  ? ?Substance and Sexual Activity  ?Drug Use Not Currently  ? Types: Marijuana  ? Comment: Patient denies   ?  ?Social History  ? ?Socioeconomic History  ? Marital status: Legally Separated  ?  Spouse name: Not on file  ? Number of children: 2  ? Years of education: 13 yrs  ? Highest education level: Not on file  ?Occupational History  ? Occupation: custodian  ?  Comment: [employer]  ?Tobacco Use  ? Smoking status: Never  ? Smokeless tobacco: Never  ?Vaping Use  ? Vaping Use: Never used  ?Substance and Sexual Activity  ? Alcohol use: Yes  ?  Comment: 1 beer  ? Drug use: Not Currently  ?  Types: Marijuana  ?  Comment: Patient denies   ? Sexual activity: Not Currently  ?Other Topics Concern  ? Not on file  ?Social History Narrative  ? Born and raised in [place], [place] by both parents. He has 2 sisters and is the middle child. He remains close with family. He was married for 16 yrs and seperated in 2005. He has 2 boys who are in their 20's now. He lives alone in [place]. He is working as a Retail buyer  ?   ?   ? Right handed   ? Lives in a one story home   ? ?Social Determinants of Health  ? ?Financial Resource Strain: Not on file  ?Food Insecurity: Not on file  ?Transportation Needs: Not on file  ?Physical Activity: Not on file  ?Stress: Not on file  ?Social Connections: Not on file  ? ?SDOH:  ?SDOH Screenings  ? ?Alcohol Screen: Not on file  ?Depression (PHQ2-9): Medium Risk  ? PHQ-2 Score: 13  ?Financial Resource Strain: Not on file  ?Food Insecurity: Not on file  ?Housing: Not on file  ?Physical Activity: Not on file  ?Social Connections: Not on file  ?Stress: Not on file  ?Tobacco Use: Low Risk   ? Smoking Tobacco Use: Never  ? Smokeless Tobacco Use: Never  ? Passive Exposure: Not on file  ?Transportation Needs: Not on file  ? ?Additional Social History:  ?  ?Pain Medications: Please see MAR ?Prescriptions: Please see MAR ?Over the Counter: Please see MAR ?History of alcohol / drug use?: Yes ?Longest period of  sobriety (when/how long): 1 year ?Negative Consequences of Use: Work / Youth worker, Museum/gallery curator ?Withdrawal Symptoms: Tingling ?  ?  ?  ?Sleep: Good ? ?Appetite:  Fair ? ?Current Medications:  ?Current Facility-Administered Medications  ?Medication Dose Route Frequency Provider Last Rate Last Admin  ? acetaminophen (TYLENOL) tablet 650 mg  650 mg Oral Q6H PRN Revonda Humphrey, NP      ? alum & mag hydroxide-simeth (MAALOX/MYLANTA) 200-200-20 MG/5ML suspension 30 mL  30 mL Oral Q4H PRN Revonda Humphrey, NP      ? atorvastatin (LIPITOR)  tablet 20 mg  20 mg Oral Daily Revonda Humphrey, NP   20 mg at 03/29/22 0981  ? escitalopram (LEXAPRO) tablet 20 mg  20 mg Oral Daily Revonda Humphrey, NP   20 mg at 03/29/22 1914  ? gabapentin (NEURONTIN) capsule 200 mg  200 mg Oral TID Ival Bible, MD   200 mg at 03/29/22 7829  ? hydrOXYzine (ATARAX) tablet 25 mg  25 mg Oral Q6H PRN Revonda Humphrey, NP   25 mg at 03/28/22 0703  ? loperamide (IMODIUM) capsule 2-4 mg  2-4 mg Oral PRN Ival Bible, MD      ? LORazepam (ATIVAN) tablet 1 mg  1 mg Oral Q6H PRN Ival Bible, MD      ? LORazepam (ATIVAN) tablet 1 mg  1 mg Oral TID Ival Bible, MD   1 mg at 03/29/22 0957  ? Followed by  ? Derrill Memo ON 03/30/2022] LORazepam (ATIVAN) tablet 1 mg  1 mg Oral BID Ival Bible, MD      ? Followed by  ? Derrill Memo ON 03/31/2022] LORazepam (ATIVAN) tablet 1 mg  1 mg Oral Daily Ival Bible, MD      ? magnesium hydroxide (MILK OF MAGNESIA) suspension 30 mL  30 mL Oral Daily PRN Revonda Humphrey, NP      ? multivitamin with minerals tablet 1 tablet  1 tablet Oral Daily Revonda Humphrey, NP   1 tablet at 03/29/22 5621  ? ondansetron (ZOFRAN-ODT) disintegrating tablet 4 mg  4 mg Oral Q6H PRN Revonda Humphrey, NP      ? thiamine (B-1) injection 100 mg  100 mg Intramuscular Once Revonda Humphrey, NP      ? thiamine tablet 100 mg  100 mg Oral Daily Revonda Humphrey, NP   100 mg at 03/29/22 0957  ?  traZODone (DESYREL) tablet 50 mg  50 mg Oral QHS PRN Revonda Humphrey, NP   50 mg at 03/27/22 2105  ? ?Current Outpatient Medications  ?Medication Sig Dispense Refill  ? amLODipine (NORVASC) 5 MG tablet Take 1

## 2022-03-29 NOTE — ED Notes (Signed)
Pt sleeping@this time. Breathing even and unlabored. Will continue to monitor for safety 

## 2022-03-29 NOTE — ED Notes (Signed)
Pt sleeping@this time. Breathing even and unlabored will continue to monitor for safety 

## 2022-03-29 NOTE — ED Notes (Signed)
Pt. Attended AA tonight.  ?

## 2022-03-29 NOTE — Clinical Social Work Psych Note (Signed)
LCSW Update ? ?[name] [name] reports he was feeling "a little better". [name] [name] reports he slept okay last night. He also shared that his tremors/shaking has gotten better since he was admitted to the [facility].  ? ?[name] [name] denied having SI, HI or AVH at this time.  ? ?[name] [name] reports he is unsure if he wants to go to [facility] or return to his previous living accomadtion with a friend in [place], [place]. He shared tjhat he does not particularly like specific individual's attitudes who work for the [facility] program.  ? ?[name] [name] shared that his main goal is to detox from alcohol safely, and he believes he will be able to maintain sobriety without participating in residential treatment, however he will confirm his decision no later than tomorrow afternoon.  ? ?If he does decide to participate; [name] [name] is scheduled to discharge Friday, 03/31/22 by 9:00AM to participate in [facility]'s 30-day substance abuse treatment program. [name] [name] will be transported via taxi voucher.  ? ?If not; [name] [name] will discharge home with his friend in [place], [place] with outpatient follow up for mental health and substance abuse.  ? ?LCSW will continue to follow.  ? ? ? ?Radonna Ricker, MSW, LCSW ?Clinical Education officer, museum Insurance claims handler) ?[facility]  ? ?

## 2022-03-29 NOTE — ED Provider Notes (Signed)
Behavioral Health Progress Note ? ?Date and Time: 03/29/2022 1:17 PM ?Name: [name] [name] ?MRN:  [id number] ? ? ?Patient seen in conjunction with PA student [name] [name]. Original note by PA student [name] [name] ; edited by me as appropriate for completeness and accuracy.  ? ? ?Subjective:   ?61 yo male with history of MDD, anxiety, AUD who presented to the [facility] on 03/27/22 for detox; he was admitted to the [facility] for detox and crisis stabilization. Etoh 423; UDS negative.  ? ?Initiated ativan taper-most recent CIWA 4. ?Patient seen and chart reviewed. He has been medication compliant and has been appropriate with staff and peers on the unit.  ? ? " I feel a little better" patient reports feeling better than yesterday, decreased tremors.  ?  ?On interview today patient is  calm, cooperative and pleasant. Patient  reported he had a good night, he estimates slept about five hours after  taking  Trazodone. He reported waking up sometimes during  the night. ?  ?He stated his appetite is improving, he had breakfast today. Patient denied any suicidal ideation, hallucinations, thought of harm himself or others. He rated anxiety 5/10, depression 7/10, anger 0/10 being 10 great severity. His anxiety is constant every day, his depression " comes and goes". He reported having some cravings for alcohol. He reports intermittent nausea although states that he was able to tolerate breakfast without issue, denies vomiting. He reports congestion this morning and runny nose. ?  ?Patient is compliant with medications, he is currently taking Lexapro and Gabapentin.  ?  ?Discussed elevated blood pressure this morning (152/98) with patient. He stated his PCP stopped his antihypertensive medications because the blood pressure was well controlled.  Patient reported being diagnose with pre diabetes and request setting up an appointment with his PCP. ?Also, he wants to see a psychiatric  on regular basis but doesn't have insurance.LCSW explained  to him the ability to come to this clinic for Psychiatric appointment without insurance. Informed patient that he would be provided with resources on discharge.  ? ?Patient states that he has been to [facility] before and expresses uncertainty if he would like to go there for residential rehab citing some perceived difficulties during his previous stay. He states that he is undecided if he would like to go to [facility] or if he would like to discharge to his previous living situation (staying with a family member) and to follow up with other options for residential rehab. Discussed that other residential treatment facilities have a wait time of at least several weeks. Patient verbalized understanding and expresses that he remains undecided and expresses that [place],  away of " bad community influence and bars" could be sufficient. ?  ? ? ? ? ?Diagnosis:  ?Final diagnoses:  ?Alcohol abuse with intoxication ([place])  ?Generalized anxiety disorder  ?Major depressive disorder, severe ([place])  ?Alcohol use disorder, severe, in controlled environment [facility])  ? ? ?Total Time spent with patient: 30 minutes ? ?Past Psychiatric History: MDD, GAD, AUD, SIMD ?Past Medical History:  ?Past Medical History:  ?Diagnosis Date  ? Alcohol abuse   ? Anxiety   ? About 61 years of age  ? Carpal tunnel syndrome of right wrist Dx 2015  ? Depression   ? At 61 years of age  ? GERD (gastroesophageal reflux disease) Dx 2007  ? Hepatitis, alcoholic, acute 4854  ? Hyperlipidemia   ? borderline, diet controlled  ? Neuropathy   ? Pancreatitis Dx  2014  ? Pneumonia 11/2014  ? 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 Reflux Dx 2007  ? 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  ?Past Surgical History:  ?Procedure Laterality Date  ? carpel tunnel release Right 07/28/2014   ? done in [place]   ? ELBOW SURGERY    ? KNEE ARTHROSCOPY    ? KNEE SURGERY Left   ? 15 years ago  ? LIPOMA EXCISION N/A 05/10/2016  ? Procedure:  EXCISION OF SCALP LIPOMA;  Surgeon: Johnathan Hausen, MD;  Location: [place];  Service: General;  Laterality: N/A;  ? ?Family History:  ?Family History  ?Problem Relation Age of Onset  ? Diabetes Father   ? Hypertension Father   ? Hypertension Paternal Grandfather   ? Alcohol abuse Paternal Grandfather   ? Alcohol abuse Maternal Grandfather   ? Alcohol abuse Maternal Grandmother   ? Alcohol abuse Paternal Grandmother   ? Colon cancer Neg Hx   ? Rectal cancer Neg Hx   ? Stomach cancer Neg Hx   ? Bipolar disorder Neg Hx   ? Depression Neg Hx   ? ?Family Psychiatric  History:  ?Brother with alcohol use  ?Cousin with completed suicide ?Niece with bipolar disorder  ? ?Social History:  ?Social History  ? ?Substance and Sexual Activity  ?Alcohol Use Yes  ? Comment: 1 beer  ?   ?Social History  ? ?Substance and Sexual Activity  ?Drug Use Not Currently  ? Types: Marijuana  ? Comment: Patient denies   ?  ?Social History  ? ?Socioeconomic History  ? Marital status: Legally Separated  ?  Spouse name: Not on file  ? Number of children: 2  ? Years of education: 13 yrs  ? Highest education level: Not on file  ?Occupational History  ? Occupation: custodian  ?  Comment: [employer]  ?Tobacco Use  ? Smoking status: Never  ? Smokeless tobacco: Never  ?Vaping Use  ? Vaping Use: Never used  ?Substance and Sexual Activity  ? Alcohol use: Yes  ?  Comment: 1 beer  ? Drug use: Not Currently  ?  Types: Marijuana  ?  Comment: Patient denies   ? Sexual activity: Not Currently  ?Other Topics Concern  ? Not on file  ?Social History Narrative  ? Born and raised in [place], [place] by both parents. He has 2 sisters and is the middle child. He remains close with family. He was married for 16 yrs and seperated in 2005. He has 2 boys who are in their 20's now. He lives alone in [place]. He is working as a Retail buyer  ?   ?   ? Right handed   ? Lives in a one story home   ? ?Social Determinants of Health  ? ?Financial Resource Strain: Not on  file  ?Food Insecurity: Not on file  ?Transportation Needs: Not on file  ?Physical Activity: Not on file  ?Stress: Not on file  ?Social Connections: Not on file  ? ?SDOH:  ?SDOH Screenings  ? ?Alcohol Screen: Not on file  ?Depression (PHQ2-9): Medium Risk  ? PHQ-2 Score: 13  ?Financial Resource Strain: Not on file  ?Food Insecurity: Not on file  ?Housing: Not on file  ?Physical Activity: Not on file  ?Social Connections: Not on file  ?Stress: Not on file  ?Tobacco Use: Low Risk   ? Smoking Tobacco Use: Never  ? Smokeless Tobacco Use: Never  ? Passive Exposure: Not on file  ?  Transportation Needs: Not on file  ? ?Additional Social History:  ?  ?Pain Medications: Please see MAR ?Prescriptions: Please see MAR ?Over the Counter: Please see MAR ?History of alcohol / drug use?: Yes ?Longest period of sobriety (when/how long): 1 year ?Negative Consequences of Use: Work / Youth worker, Museum/gallery curator ?Withdrawal Symptoms: Tingling ?  ?  ?  ?  ?  ?  ?  ?  ?  ? ?Sleep: Poor ? ?Appetite:  Fair ? ?Current Medications:  ?Current Facility-Administered Medications  ?Medication Dose Route Frequency Provider Last Rate Last Admin  ? acetaminophen (TYLENOL) tablet 650 mg  650 mg Oral Q6H PRN Revonda Humphrey, NP      ? alum & mag hydroxide-simeth (MAALOX/MYLANTA) 200-200-20 MG/5ML suspension 30 mL  30 mL Oral Q4H PRN Revonda Humphrey, NP      ? atorvastatin (LIPITOR) tablet 20 mg  20 mg Oral Daily Revonda Humphrey, NP   20 mg at 03/29/22 0955  ? dextromethorphan-guaiFENesin (MUCINEX DM) 30-600 MG per 12 hr tablet 1 tablet  1 tablet Oral BID Ival Bible, MD      ? escitalopram (LEXAPRO) tablet 20 mg  20 mg Oral Daily Revonda Humphrey, NP   20 mg at 03/29/22 0955  ? gabapentin (NEURONTIN) capsule 200 mg  200 mg Oral TID Ival Bible, MD   200 mg at 03/29/22 2841  ? hydrOXYzine (ATARAX) tablet 25 mg  25 mg Oral Q6H PRN Revonda Humphrey, NP   25 mg at 03/28/22 0703  ? loperamide (IMODIUM) capsule 2-4 mg  2-4 mg Oral PRN  Ival Bible, MD      ? LORazepam (ATIVAN) tablet 1 mg  1 mg Oral Q6H PRN Ival Bible, MD      ? LORazepam (ATIVAN) tablet 1 mg  1 mg Oral TID Ival Bible, MD   1 mg at 05/03/

## 2022-03-29 NOTE — BH IP Treatment Plan (Signed)
Interdisciplinary Treatment and Diagnostic Plan Update ? ?03/29/2022 ?Time of Session: 10:00AM ?[name] [name] ?MRN: [id number] ? ?Diagnosis:  ?Final diagnoses:  ?Alcohol abuse with intoxication ([place])  ?Generalized anxiety disorder  ?Major depressive disorder, severe ([place])  ?Alcohol use disorder, severe, in controlled environment [facility])  ? ? ? ?Current Medications:  ?Current Facility-Administered Medications  ?Medication Dose Route Frequency Provider Last Rate Last Admin  ? acetaminophen (TYLENOL) tablet 650 mg  650 mg Oral Q6H PRN Revonda Humphrey, NP      ? alum & mag hydroxide-simeth (MAALOX/MYLANTA) 200-200-20 MG/5ML suspension 30 mL  30 mL Oral Q4H PRN Revonda Humphrey, NP      ? atorvastatin (LIPITOR) tablet 20 mg  20 mg Oral Daily Revonda Humphrey, NP   20 mg at 03/29/22 0955  ? dextromethorphan-guaiFENesin (MUCINEX DM) 30-600 MG per 12 hr tablet 1 tablet  1 tablet Oral BID Ival Bible, MD      ? escitalopram (LEXAPRO) tablet 20 mg  20 mg Oral Daily Revonda Humphrey, NP   20 mg at 03/29/22 0955  ? gabapentin (NEURONTIN) capsule 200 mg  200 mg Oral TID Ival Bible, MD   200 mg at 03/29/22 9417  ? hydrOXYzine (ATARAX) tablet 25 mg  25 mg Oral Q6H PRN Revonda Humphrey, NP   25 mg at 03/28/22 0703  ? loperamide (IMODIUM) capsule 2-4 mg  2-4 mg Oral PRN Ival Bible, MD      ? LORazepam (ATIVAN) tablet 1 mg  1 mg Oral Q6H PRN Ival Bible, MD      ? LORazepam (ATIVAN) tablet 1 mg  1 mg Oral TID Ival Bible, MD   1 mg at 03/29/22 0957  ? Followed by  ? Derrill Memo ON 03/30/2022] LORazepam (ATIVAN) tablet 1 mg  1 mg Oral BID Ival Bible, MD      ? Followed by  ? Derrill Memo ON 03/31/2022] LORazepam (ATIVAN) tablet 1 mg  1 mg Oral Daily Ival Bible, MD      ? magnesium hydroxide (MILK OF MAGNESIA) suspension 30 mL  30 mL Oral Daily PRN Revonda Humphrey, NP      ? multivitamin with minerals tablet 1 tablet  1 tablet Oral Daily Revonda Humphrey, NP   1 tablet at  03/29/22 4081  ? ondansetron (ZOFRAN-ODT) disintegrating tablet 4 mg  4 mg Oral Q6H PRN Revonda Humphrey, NP      ? thiamine (B-1) injection 100 mg  100 mg Intramuscular Once Revonda Humphrey, NP      ? thiamine tablet 100 mg  100 mg Oral Daily Revonda Humphrey, NP   100 mg at 03/29/22 0957  ? traZODone (DESYREL) tablet 50 mg  50 mg Oral QHS PRN Revonda Humphrey, NP   50 mg at 03/27/22 2105  ? ?Current Outpatient Medications  ?Medication Sig Dispense Refill  ? amLODipine (NORVASC) 5 MG tablet Take 1 tablet (5 mg total) by mouth daily. 30 tablet 0  ? atorvastatin (LIPITOR) 20 MG tablet Take 1 tablet (20 mg total) by mouth daily. 30 tablet 0  ? escitalopram (LEXAPRO) 20 MG tablet Take 1 tablet (20 mg total) by mouth daily. 30 tablet 0  ? gabapentin (NEURONTIN) 100 MG capsule Take 1 capsule (100 mg total) by mouth 3 (three) times daily. 90 capsule 0  ? Multiple Vitamin (MULTIVITAMIN WITH MINERALS) TABS tablet Take 1 tablet by mouth daily. 30 tablet 0  ? traZODone (DESYREL) 50 MG  tablet Take 1 tablet (50 mg total) by mouth at bedtime as needed for sleep. 30 tablet 0  ? ?PTA Medications: ?Prior to Admission medications   ?Medication Sig Start Date End Date Taking? Authorizing Provider  ?amLODipine (NORVASC) 5 MG tablet Take 1 tablet (5 mg total) by mouth daily. 12/30/21  Yes Ival Bible, MD  ?atorvastatin (LIPITOR) 20 MG tablet Take 1 tablet (20 mg total) by mouth daily. 12/30/21 03/28/22 Yes Ival Bible, MD  ?escitalopram (LEXAPRO) 20 MG tablet Take 1 tablet (20 mg total) by mouth daily. 12/31/21 03/28/22 Yes Ival Bible, MD  ?gabapentin (NEURONTIN) 100 MG capsule Take 1 capsule (100 mg total) by mouth 3 (three) times daily. 12/30/21  Yes Ival Bible, MD  ?Multiple Vitamin (MULTIVITAMIN WITH MINERALS) TABS tablet Take 1 tablet by mouth daily. 12/31/21  Yes Ival Bible, MD  ?traZODone (DESYREL) 50 MG tablet Take 1 tablet (50 mg total) by mouth at bedtime as needed for sleep. 12/30/21  03/28/22 Yes Ival Bible, MD  ? ? ?Patient Stressors: Financial difficulties   ?Medication change or noncompliance   ?Substance abuse   ? ?Patient Strengths: Motivation for treatment/growth  ? ?Treatment Modalities: Medication Management, Group therapy, Case management,  ?1 to 1 session with clinician, Psychoeducation, Recreational therapy. ? ? ?Physician Treatment Plan for Primary and Secondary Diagnosis:  ?Final diagnoses:  ?Alcohol abuse with intoxication ([place])  ?Generalized anxiety disorder  ?Major depressive disorder, severe ([place])  ?Alcohol use disorder, severe, in controlled environment [facility])  ? ?Long Term Goal(s): Improvement in symptoms so as ready for discharge ? ?Short Term Goals: Ability to identify changes in lifestyle to reduce recurrence of condition will improve ?Ability to verbalize feelings will improve ?Ability to disclose and discuss suicidal ideas ?Ability to demonstrate self-control will improve ?Ability to identify and develop effective coping behaviors will improve ?Ability to maintain clinical measurements within normal limits will improve ?Compliance with prescribed medications will improve ?Ability to identify triggers associated with substance abuse/mental health issues will improve ? ?Medication Management: Evaluate patient's response, side effects, and tolerance of medication regimen. ? ?Therapeutic Interventions: 1 to 1 sessions, Unit Group sessions and Medication administration. ? ?Evaluation of Outcomes: Progressing ? ?RN Treatment Plan for Primary Diagnosis:  ?Final diagnoses:  ?Alcohol abuse with intoxication ([place])  ?Generalized anxiety disorder  ?Major depressive disorder, severe ([place])  ?Alcohol use disorder, severe, in controlled environment [facility])  ? ? ?Long Term Goal(s): Knowledge of disease and therapeutic regimen to maintain health will improve ? ?Short Term Goals: Ability to identify and develop effective coping behaviors will improve and Compliance with prescribed  medications will improve ? ?Medication Management: RN will administer medications as ordered by provider, will assess and evaluate patient's response and provide education to patient for prescribed medication. RN will report any adverse and/or side effects to prescribing provider. ? ?Therapeutic Interventions: 1 on 1 counseling sessions, Psychoeducation, Medication administration, Evaluate responses to treatment, Monitor vital signs and CBGs as ordered, Perform/monitor CIWA, COWS, AIMS and Fall Risk screenings as ordered, Perform wound care treatments as ordered. ? ?Evaluation of Outcomes: Progressing ? ? ?LCSW Treatment Plan for Primary Diagnosis:  ?Final diagnoses:  ?Alcohol abuse with intoxication ([place])  ?Generalized anxiety disorder  ?Major depressive disorder, severe ([place])  ?Alcohol use disorder, severe, in controlled environment [facility])  ? ? ?Long Term Goal(s): Safe transition to appropriate next level of care at discharge, Engage patient in therapeutic group addressing interpersonal concerns. ? ?Short Term Goals: Engage patient in aftercare planning  with referrals and resources, Identify triggers associated with mental health/substance abuse issues, and Increase skills for wellness and recovery ? ?Therapeutic Interventions: Assess for all discharge needs, 1 to 1 time with Education officer, museum, Explore available resources and support systems, Assess for adequacy in community support network, Educate family and significant other(s) on suicide prevention, Complete Psychosocial Assessment, Interpersonal group therapy. ? ?Evaluation of Outcomes: Progressing ? ? ?Progress in Treatment: ?Attending groups: No. ?Participating in groups: No. ?Taking medication as prescribed: Yes. ?Toleration medication: Yes. ?Family/Significant other contact made: No, will contact:  no one at this time ?Patient understands diagnosis: Yes. ?Discussing patient identified problems/goals with staff: Yes. ?Medical problems stabilized or resolved:  Yes. ?Denies suicidal/homicidal ideation: Yes. ?Issues/concerns per patient self-inventory: No. ?Other: None  ? ?New problem(s) identified: None  ? ?New Short Term/Long Term Goal(s): Aja reports he wants to p

## 2022-03-29 NOTE — ED Notes (Signed)
Pt in dayroom watching television. Pt calm and cooperative. Will continue to monitor for safety ?

## 2022-03-29 NOTE — ED Notes (Signed)
Patient refused group, the Subject was Archivist, gave out worksheets covering goal setting, How do we grow, Identifying needs, Choosing a Value Oriented Life, Self Esteem, Discovering you life's purpose and Copying Skills. All of this comes with different worksheets they can work on in their free time. ?

## 2022-03-30 DIAGNOSIS — Z20822 Contact with and (suspected) exposure to covid-19: Secondary | ICD-10-CM | POA: Diagnosis not present

## 2022-03-30 DIAGNOSIS — F411 Generalized anxiety disorder: Secondary | ICD-10-CM | POA: Diagnosis not present

## 2022-03-30 DIAGNOSIS — F322 Major depressive disorder, single episode, severe without psychotic features: Secondary | ICD-10-CM | POA: Diagnosis not present

## 2022-03-30 DIAGNOSIS — F10129 Alcohol abuse with intoxication, unspecified: Secondary | ICD-10-CM | POA: Diagnosis not present

## 2022-03-30 MED ORDER — DM-GUAIFENESIN ER 30-600 MG PO TB12
1.0000 | ORAL_TABLET | Freq: Two times a day (BID) | ORAL | 0 refills | Status: DC
Start: 2022-03-30 — End: 2022-04-13

## 2022-03-30 MED ORDER — ATORVASTATIN CALCIUM 20 MG PO TABS: 20.0000 mg | ORAL_TABLET | Freq: Every day | ORAL | 0 refills | Status: DC

## 2022-03-30 MED ORDER — ESCITALOPRAM OXALATE 20 MG PO TABS
20.0000 mg | ORAL_TABLET | Freq: Every day | ORAL | 0 refills | Status: DC
Start: 2022-03-30 — End: 2022-04-13

## 2022-03-30 MED ORDER — THIAMINE HCL 100 MG PO TABS: 100.0000 mg | ORAL_TABLET | Freq: Every day | ORAL | 0 refills | Status: DC

## 2022-03-30 MED ORDER — TRAZODONE HCL 50 MG PO TABS: 50.0000 mg | ORAL_TABLET | Freq: Every evening | ORAL | 0 refills | Status: DC | PRN

## 2022-03-30 MED ORDER — GABAPENTIN 100 MG PO CAPS: 200.0000 mg | ORAL_CAPSULE | Freq: Three times a day (TID) | ORAL | 0 refills | Status: DC

## 2022-03-30 MED ORDER — ADULT MULTIVITAMIN W/MINERALS CH: 1.0000 | ORAL_TABLET | Freq: Every day | ORAL | 0 refills | Status: DC

## 2022-03-30 NOTE — ED Notes (Signed)
Patient is calm and pleasant on approach.  He is sad with constricted affect.  Thought process is organized and logical.  No somatic complaints at present.  Patient is tolerating ativan taper and no evidence of etoh withdrawal at this time.  He ate lunch and is able to make needs known.  Will monitor and provide safe environment.   ?

## 2022-03-30 NOTE — ED Notes (Signed)
Pt is cooperative and attended the group meeting ?

## 2022-03-30 NOTE — ED Notes (Signed)
Pt sitting in dining room watching TV. A&O x4, calm and cooperative. Denies current SI/HI/AVH. Reports headache 4/10. PRN Tylenol given. MHT provided snacks. No signs of acute distress noted. Will continue to monitor for safety.  ?

## 2022-03-30 NOTE — ED Provider Notes (Signed)
Behavioral Health Progress Note ? ?Date and Time: 03/30/2022 12:08 PM ?Name: [name] ?MRN:  [id number] ? ? ? ?Subjective:   ?61 yo male with history of MDD, anxiety, AUD who presented to the [facility] on 03/27/22 for detox; he was admitted to the [facility] for detox and crisis stabilization. Etoh 423; UDS negative.  ? ?Patient seen and chart reviewed. He has been medication compliant and has been appropriate with staff and peers on the unit. Most recent CIWA 0.  ? ?Patient observered participating in  group prior to interview this morning. Patient describes his mood as "I'm good" and describes overall improvement of alcohol withdrawal sx. He reports diarrhea and  mild tremors in BUE which have improved. He denies nausea, vomiting, diaphoresis, headache. He reports congestion has improved with addition of mucinex. He denies SI/HI/AVH. He states that he spoke to his uncle and would like to discharge tomorrow to [place] to stay with his uncle after his taper ends. He states that  he would like to explore other substance use treatment options apart from [facility] on his own after discharge. He states that "I will be in safe enviornment" and describes his uncle's home being a place where he would not have access to alcohol. He requests information on housing assistance. Discussed with patient that on discharge he will be provided with 7 day samples and 30 day prescriptions; discussed walk in hours at the [facility] health center and advised that he present for walk in appointment prior to running out of his medications. Patient was given the opportunity to ask questions and  All questions answered. Patient verbalized understanding regarding plan of care.  ? ? ? ? ? ?Diagnosis:  ?Final diagnoses:  ?Alcohol abuse with intoxication ([place])  ?Generalized anxiety disorder  ?Major depressive disorder, severe ([place])  ?Alcohol use disorder, severe, in controlled environment [facility])  ? ? ?Total Time spent with patient: 20  minutes ? ?Past Psychiatric History: MDD, GAD, AUD, SIMD ?Past Medical History:  ?Past Medical History:  ?Diagnosis Date  ? Alcohol abuse   ? Anxiety   ? About 61 years of age  ? Carpal tunnel syndrome of right wrist Dx 2015  ? Depression   ? At 61 years of age  ? GERD (gastroesophageal reflux disease) Dx 2007  ? Hepatitis, alcoholic, acute 5809  ? Hyperlipidemia   ? borderline, diet controlled  ? Neuropathy   ? Pancreatitis Dx 2014  ? Pneumonia 11/2014  ? 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 Reflux Dx 2007  ? 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  ?Past Surgical History:  ?Procedure Laterality Date  ? carpel tunnel release Right 07/28/2014   ? done in [place]   ? ELBOW SURGERY    ? KNEE ARTHROSCOPY    ? KNEE SURGERY Left   ? 15 years ago  ? LIPOMA EXCISION N/A 05/10/2016  ? Procedure: EXCISION OF SCALP LIPOMA;  Surgeon: Johnathan Hausen, MD;  Location: [place];  Service: General;  Laterality: N/A;  ? ?Family History:  ?Family History  ?Problem Relation Age of Onset  ? Diabetes Father   ? Hypertension Father   ? Hypertension Paternal Grandfather   ? Alcohol abuse Paternal Grandfather   ? Alcohol abuse Maternal Grandfather   ? Alcohol abuse Maternal Grandmother   ? Alcohol abuse Paternal Grandmother   ? Colon cancer Neg Hx   ? Rectal cancer Neg Hx   ? Stomach cancer Neg Hx   ? Bipolar  disorder Neg Hx   ? Depression Neg Hx   ? ?Family Psychiatric  History:  ?Brother with alcohol use  ?Cousin with completed suicide ?Niece with bipolar disorder  ? ?Social History:  ?Social History  ? ?Substance and Sexual Activity  ?Alcohol Use Yes  ? Comment: 1 beer  ?   ?Social History  ? ?Substance and Sexual Activity  ?Drug Use Not Currently  ? Types: Marijuana  ? Comment: Patient denies   ?  ?Social History  ? ?Socioeconomic History  ? Marital status: Legally Separated  ?  Spouse name: Not on file  ? Number of children: 2  ? Years of education: 13 yrs  ? Highest education level: Not on file   ?Occupational History  ? Occupation: custodian  ?  Comment: [employer]  ?Tobacco Use  ? Smoking status: Never  ? Smokeless tobacco: Never  ?Vaping Use  ? Vaping Use: Never used  ?Substance and Sexual Activity  ? Alcohol use: Yes  ?  Comment: 1 beer  ? Drug use: Not Currently  ?  Types: Marijuana  ?  Comment: Patient denies   ? Sexual activity: Not Currently  ?Other Topics Concern  ? Not on file  ?Social History Narrative  ? Born and raised in [name], [place] by both parents. He has 2 sisters and is the middle child. He remains close with family. He was married for 16 yrs and seperated in 2005. He has 2 boys who are in their 20's now. He lives alone in [place]. He is working as a Retail buyer  ?   ?   ? Right handed   ? Lives in a one story home   ? ?Social Determinants of Health  ? ?Financial Resource Strain: Not on file  ?Food Insecurity: Not on file  ?Transportation Needs: Not on file  ?Physical Activity: Not on file  ?Stress: Not on file  ?Social Connections: Not on file  ? ?SDOH:  ?SDOH Screenings  ? ?Alcohol Screen: Not on file  ?Depression (PHQ2-9): Medium Risk  ? PHQ-2 Score: 13  ?Financial Resource Strain: Not on file  ?Food Insecurity: Not on file  ?Housing: Not on file  ?Physical Activity: Not on file  ?Social Connections: Not on file  ?Stress: Not on file  ?Tobacco Use: Low Risk   ? Smoking Tobacco Use: Never  ? Smokeless Tobacco Use: Never  ? Passive Exposure: Not on file  ?Transportation Needs: Not on file  ? ?Additional Social History:  ?  ?Pain Medications: Please see MAR ?Prescriptions: Please see MAR ?Over the Counter: Please see MAR ?History of alcohol / drug use?: Yes ?Longest period of sobriety (when/how long): 1 year ?Negative Consequences of Use: Work / Youth worker, Museum/gallery curator ?Withdrawal Symptoms: Tingling ?  ?  ?  ?  ?  ?  ?  ?  ?  ? ?Sleep: Poor ? ?Appetite:  Fair ? ?Current Medications:  ?Current Facility-Administered Medications  ?Medication Dose Route Frequency Provider Last Rate Last Admin  ?  acetaminophen (TYLENOL) tablet 650 mg  650 mg Oral Q6H PRN Revonda Humphrey, NP      ? alum & mag hydroxide-simeth (MAALOX/MYLANTA) 200-200-20 MG/5ML suspension 30 mL  30 mL Oral Q4H PRN Revonda Humphrey, NP      ? atorvastatin (LIPITOR) tablet 20 mg  20 mg Oral Daily Revonda Humphrey, NP   20 mg at 03/30/22 0901  ? dextromethorphan-guaiFENesin (MUCINEX DM) 30-600 MG per 12 hr tablet 1 tablet  1 tablet Oral BID  Ival Bible, MD   1 tablet at 03/30/22 0901  ? escitalopram (LEXAPRO) tablet 20 mg  20 mg Oral Daily Revonda Humphrey, NP   20 mg at 03/30/22 0901  ? gabapentin (NEURONTIN) capsule 200 mg  200 mg Oral TID Ival Bible, MD   200 mg at 03/30/22 0901  ? hydrOXYzine (ATARAX) tablet 25 mg  25 mg Oral Q6H PRN Revonda Humphrey, NP   25 mg at 03/28/22 0703  ? loperamide (IMODIUM) capsule 2-4 mg  2-4 mg Oral PRN Ival Bible, MD      ? LORazepam (ATIVAN) tablet 1 mg  1 mg Oral Q6H PRN Ival Bible, MD      ? LORazepam (ATIVAN) tablet 1 mg  1 mg Oral BID Ival Bible, MD   1 mg at 03/30/22 0901  ? Followed by  ? Derrill Memo ON 03/31/2022] LORazepam (ATIVAN) tablet 1 mg  1 mg Oral Daily Ival Bible, MD      ? magnesium hydroxide (MILK OF MAGNESIA) suspension 30 mL  30 mL Oral Daily PRN Revonda Humphrey, NP      ? multivitamin with minerals tablet 1 tablet  1 tablet Oral Daily Revonda Humphrey, NP   1 tablet at 03/30/22 0901  ? ondansetron (ZOFRAN-ODT) disintegrating tablet 4 mg  4 mg Oral Q6H PRN Revonda Humphrey, NP      ? thiamine (B-1) injection 100 mg  100 mg Intramuscular Once Revonda Humphrey, NP      ? thiamine tablet 100 mg  100 mg Oral Daily Revonda Humphrey, NP   100 mg at 03/30/22 0901  ? traZODone (DESYREL) tablet 50 mg  50 mg Oral QHS PRN Revonda Humphrey, NP   50 mg at 03/29/22 2106  ? ?Current Outpatient Medications  ?Medication Sig Dispense Refill  ? amLODipine (NORVASC) 5 MG tablet Take 1 tablet (5 mg total) by mouth daily. 30 tablet 0  ?  atorvastatin (LIPITOR) 20 MG tablet Take 1 tablet (20 mg total) by mouth daily. 30 tablet 0  ? escitalopram (LEXAPRO) 20 MG tablet Take 1 tablet (20 mg total) by mouth daily. 30 tablet 0  ? gabapentin (NEURONTIN) 100

## 2022-03-30 NOTE — Group Note (Signed)
Group Topic: Balance in Life  ?Group Date: 03/30/2022 ?Start Time: 1030 ?End Time: 1100 ?Facilitators: Laury Axon E  ?Department: [facility] ? ?Number of Participants: 2  ?Group Focus: acceptance, activities of daily living skills, coping skills, and self-awareness ?Treatment Modality:  Behavior Modification Therapy and Solution-Focused Therapy ?Interventions utilized were patient education ?Purpose: enhance coping skills and express feelings ? ?Name: [name] Date of Birth: [birth year]  ?MR: [id number]   ? ?Level of Participation: active ?Quality of Participation: attentive and cooperative ?Interactions with others: gave feedback ?Mood/Affect: appropriate ?Triggers (if applicable): n/a ?Cognition: coherent/clear ?Progress: Moderate ?Response: n/a ?Plan: follow-up needed ? ?Patients Problems:  ?Patient Active Problem List  ? Diagnosis Date Noted  ? Alcohol abuse with intoxication ([place]) 03/27/2022  ? Neuropathy 12/30/2021  ? Alcohol dependence with alcohol-induced mood disorder ([place]) 12/27/2021  ? Essential hypertension 04/07/2021  ? Prediabetes 04/07/2021  ? Other hyperlipidemia 04/07/2021  ? Major depressive disorder, severe ([place]) 03/16/2021  ? MDD (major depressive disorder), recurrent episode, severe ([place]) 06/30/2017  ? Lateral epicondylitis of right elbow 01/05/2017  ? Strain, MCP, hand, right, initial encounter 01/05/2017  ? 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 Bilateral chronic knee pain 01/11/2016  ? Chronic pain of right wrist 07/07/2015  ? Carpal tunnel syndrome of right wrist 05/20/2015  ? Dislocation of right shoulder joint 05/20/2015  ? Tear of medial meniscus of right knee 03/24/2015  ? 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 Chronic cough 01/15/2015  ? Onychomycosis of toenail 12/15/2014  ? Dermatitis of face 11/11/2014  ? GERD (gastroesophageal reflux disease)   ? 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[place]) 01/23/2014  ? Alcohol abuse 01/19/2014  ?  Alcohol dependence ([place]) 09/02/2013  ? Generalized anxiety disorder 09/02/2013  ?  ?

## 2022-03-30 NOTE — ED Provider Notes (Signed)
FBC/OBS ASAP Discharge Summary ? ?Date and Time: 03/31/2022 10:02 AM  ?Name: [name]  ?MRN:  [id number]  ? ?Discharge Diagnoses:  ?Final diagnoses:  ?Alcohol abuse with intoxication ([place])  ?Generalized anxiety disorder  ?Major depressive disorder, severe ([place])  ?Alcohol use disorder, severe, in controlled environment [facility])  ? ? ?Subjective:  ?Patient seen and chart reviewed- he has been medication compliant and has been appropriate with staff and peers. Most recent CIWA 2. He denied SI/HI/AVH. He denied  alcohol withdrawal sx of t nausea, vomiting, diaphoresis. He reports minimal tremor which has been much improved as well as diarrhea which is resolving.Discussed with patient that he will be provided with 7 day samples and 30 day prescriptions. Discussed open access hours at the [facility] health center and advised to follow up prior to running out of medications. He denies SI/HI/AVH and requests transportation to uncle's home in [place]. ? ?Stay Summary:  ?61 yo male with history of MDD, anxiety, AUD who presented to the [facility] on 03/27/22 for detox; he was admitted to the [facility] for detox and crisis stabilization. Etoh 423; UDS negative. On admission , patient was restarted on his previous home medications of lexapro 20 mg, gabapentin 100 mg tID, trazodone 50 mg qhs as well as CIWA protocol. Patient had two CIWA readings >10 and was started on ativan taper on 03/28/22 which ended 03/31/22. After initiation of ativan taper, CIWA scores steadily decreased and ranged from 8-0. Gabapentin was increased from 100 mg TID to 200 mg TID on 03/28/22 due to ongoing anxiety and tremors. Mucinex was started on 5/3 for congestion which improved throughout his admission. Patient verbalized readiness for discharge on 03/31/22 after completion of ativan taper. Most recent CIWA 2 (tremor). Patient requested resources for residenital treatment on discharge as well as outpatient resources for mental health. Discussed open  access hours at the [facility] health center.  ? ?On my interview day of discharge,  patient is in NAD, alert, oriented, calm, cooperative, and attentive, with normal affect, speech, and behavior. Objectively, there is no evidence of psychosis/ mania (able to converse coherently, linear and goal directed thought, no RIS, no distractibility, not pre-occupied, no FOI, etc) nor depression to the point of suicidality (able to concentrate, affect full and reactive, speech normal r/v/t, no psychomotor retardation/agitation, etc). ? ?Overall, patient appears to be at the point, in the absence of inhibiting or disinhibiting symptoms, where he can successfully move to lesser restrictive setting for care. ? ? ?Total Time spent with patient: 20 minutes ? ?Past Psychiatric History: AUD, anxiety, MDD ?Past Medical History:  ?Past Medical History:  ?Diagnosis Date  ? Alcohol abuse   ? Anxiety   ? About 61 years of age  ? Carpal tunnel syndrome of right wrist Dx 2015  ? Depression   ? At 61 years of age  ? GERD (gastroesophageal reflux disease) Dx 2007  ? Hepatitis, alcoholic, acute 9179  ? Hyperlipidemia   ? borderline, diet controlled  ? Neuropathy   ? Pancreatitis Dx 2014  ? Pneumonia 11/2014  ? 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 Reflux Dx 2007  ? 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  ?Past Surgical History:  ?Procedure Laterality Date  ? carpel tunnel release Right 07/28/2014   ? done in [place]   ? ELBOW SURGERY    ? KNEE ARTHROSCOPY    ? KNEE SURGERY Left   ? 15 years ago  ? LIPOMA EXCISION N/A 05/10/2016  ?  Procedure: EXCISION OF SCALP LIPOMA;  Surgeon: Johnathan Hausen, MD;  Location: [place];  Service: General;  Laterality: N/A;  ? ?Family History:  ?Family History  ?Problem Relation Age of Onset  ? Diabetes Father   ? Hypertension Father   ? Hypertension Paternal Grandfather   ? Alcohol abuse Paternal Grandfather   ? Alcohol abuse Maternal Grandfather   ? Alcohol  abuse Maternal Grandmother   ? Alcohol abuse Paternal Grandmother   ? Colon cancer Neg Hx   ? Rectal cancer Neg Hx   ? Stomach cancer Neg Hx   ? Bipolar disorder Neg Hx   ? Depression Neg Hx   ? ?Family Psychiatric History:  ?Brother with alcohol use  ?Cousin with completed suicide ?Niece with bipolar disorder  ?Social History:  ?Social History  ? ?Substance and Sexual Activity  ?Alcohol Use Yes  ? Comment: 1 beer  ?   ?Social History  ? ?Substance and Sexual Activity  ?Drug Use Not Currently  ? Types: Marijuana  ? Comment: Patient denies   ?  ?Social History  ? ?Socioeconomic History  ? Marital status: Legally Separated  ?  Spouse name: Not on file  ? Number of children: 2  ? Years of education: 13 yrs  ? Highest education level: Not on file  ?Occupational History  ? Occupation: custodian  ?  Comment: [employer]  ?Tobacco Use  ? Smoking status: Never  ? Smokeless tobacco: Never  ?Vaping Use  ? Vaping Use: Never used  ?Substance and Sexual Activity  ? Alcohol use: Yes  ?  Comment: 1 beer  ? Drug use: Not Currently  ?  Types: Marijuana  ?  Comment: Patient denies   ? Sexual activity: Not Currently  ?Other Topics Concern  ? Not on file  ?Social History Narrative  ? Born and raised in [place], [place] by both parents. He has 2 sisters and is the middle child. He remains close with family. He was married for 16 yrs and seperated in 2005. He has 2 boys who are in their 20's now. He lives alone in [place]. He is working as a Retail buyer  ?   ?   ? Right handed   ? Lives in a one story home   ? ?Social Determinants of Health  ? ?Financial Resource Strain: Not on file  ?Food Insecurity: Not on file  ?Transportation Needs: Not on file  ?Physical Activity: Not on file  ?Stress: Not on file  ?Social Connections: Not on file  ? ?SDOH:  ?SDOH Screenings  ? ?Alcohol Screen: Not on file  ?Depression (PHQ2-9): Medium Risk  ? PHQ-2 Score: 15  ?Financial Resource Strain: Not on file  ?Food Insecurity: Not on file  ?Housing: Not on file   ?Physical Activity: Not on file  ?Social Connections: Not on file  ?Stress: Not on file  ?Tobacco Use: Low Risk   ? Smoking Tobacco Use: Never  ? Smokeless Tobacco Use: Never  ? Passive Exposure: Not on file  ?Transportation Needs: Not on file  ? ? ?Tobacco Cessation:  Prescription not provided because: n/a ? ?Current Medications:  ?Current Facility-Administered Medications  ?Medication Dose Route Frequency Provider Last Rate Last Admin  ? acetaminophen (TYLENOL) tablet 650 mg  650 mg Oral Q6H PRN Revonda Humphrey, NP   650 mg at 03/30/22 1903  ? alum & mag hydroxide-simeth (MAALOX/MYLANTA) 200-200-20 MG/5ML suspension 30 mL  30 mL Oral Q4H PRN Revonda Humphrey, NP      ?  atorvastatin (LIPITOR) tablet 20 mg  20 mg Oral Daily Revonda Humphrey, NP   20 mg at 03/31/22 1884  ? dextromethorphan-guaiFENesin (MUCINEX DM) 30-600 MG per 12 hr tablet 1 tablet  1 tablet Oral BID Ival Bible, MD   1 tablet at 03/31/22 1660  ? escitalopram (LEXAPRO) tablet 20 mg  20 mg Oral Daily Revonda Humphrey, NP   20 mg at 03/31/22 6301  ? gabapentin (NEURONTIN) capsule 200 mg  200 mg Oral TID Ival Bible, MD   200 mg at 03/31/22 6010  ? loperamide (IMODIUM) capsule 2-4 mg  2-4 mg Oral PRN Ival Bible, MD      ? LORazepam (ATIVAN) tablet 1 mg  1 mg Oral Q6H PRN Ival Bible, MD      ? magnesium hydroxide (MILK OF MAGNESIA) suspension 30 mL  30 mL Oral Daily PRN Revonda Humphrey, NP      ? multivitamin with minerals tablet 1 tablet  1 tablet Oral Daily Revonda Humphrey, NP   1 tablet at 03/31/22 9323  ? thiamine (B-1) injection 100 mg  100 mg Intramuscular Once Revonda Humphrey, NP      ? thiamine tablet 100 mg  100 mg Oral Daily Revonda Humphrey, NP   100 mg at 03/31/22 5573  ? traZODone (DESYREL) tablet 50 mg  50 mg Oral QHS PRN Revonda Humphrey, NP   50 mg at 03/30/22 2107  ? ?Current Outpatient Medications  ?Medication Sig Dispense Refill  ? atorvastatin (LIPITOR) 20 MG tablet Take 1  tablet (20 mg total) by mouth daily. 30 tablet 0  ? dextromethorphan-guaiFENesin (MUCINEX DM) 30-600 MG 12hr tablet Take 1 tablet by mouth 2 (two) times daily. 9 tablet 0  ? escitalopram (LEXAPRO) 20 MG tablet Ta

## 2022-03-30 NOTE — ED Notes (Signed)
Pt asleep in bed. Respirations even and unlabored. Will continue to monitor for safety. ?

## 2022-03-30 NOTE — ED Notes (Signed)
Pt sleeping@this time. Breathing even and unlabored. Will continue to monitor for safety 

## 2022-03-30 NOTE — ED Notes (Signed)
Pt. Is attending [place] meeting. ?

## 2022-03-30 NOTE — ED Notes (Signed)
Pt. Is in the dayroom eating a snack and watching TV.  ?

## 2022-03-31 DIAGNOSIS — F411 Generalized anxiety disorder: Secondary | ICD-10-CM | POA: Diagnosis not present

## 2022-03-31 DIAGNOSIS — F322 Major depressive disorder, single episode, severe without psychotic features: Secondary | ICD-10-CM | POA: Diagnosis not present

## 2022-03-31 DIAGNOSIS — F10129 Alcohol abuse with intoxication, unspecified: Secondary | ICD-10-CM | POA: Diagnosis not present

## 2022-03-31 DIAGNOSIS — Z20822 Contact with and (suspected) exposure to covid-19: Secondary | ICD-10-CM | POA: Diagnosis not present

## 2022-03-31 NOTE — Discharge Summary (Signed)
[name] to be D/C'd Home per MD order. Discussed with the patient and all questions fully answered. An After Visit Summary was printed and given to the patient. Medication samples and scripts were also given to patient. Patient escorted out and D/C home via taxi cab. ?Aaiden Depoy  Kathlen [place]  ?03/31/2022 10:37 AM ?  ?   ?

## 2022-03-31 NOTE — ED Notes (Signed)
Pt asleep in bed. Respirations even and unlabored. Will continue to monitor for safety. ?

## 2022-03-31 NOTE — Progress Notes (Signed)
Pt is awake, alert and oriented. Pt did not voice any complaints of pain or discomfort. No signs of acute distress noted. Pt received scheduled meds with no issue. Pt denies current SI/HI/AVH. Staff will monitor for pt's safety. ?

## 2022-03-31 NOTE — Discharge Instructions (Signed)
?  Please come to [facility] (this facility) during walk in hours for appointment with psychiatrist/provider for further medication management and for therapists for therapy.  ? ? Walk-Ins for medication management  are available on Monday, Wednesday, Thursday and Friday from 8am-11am.  It is first come, first -serve; it is best to arrive by 7:00 AM.  ? ? Walk-Ins for therapy are  available on Monday and Wednesday?s  8am-11am.  It is first come, first -serve; it is best to arrive by 7:00 AM.  ? ? ?When you arrive please go upstairs for your appointment. If you are unsure of where to go, inform the front desk that you are here for a walk in appointment and they will assist you with directions upstairs. ? ?Address:  ?[address] in [place], [place] ?Ph: [phone]  ? ? ? ? ? ?

## 2022-04-11 ENCOUNTER — Telehealth (HOSPITAL_COMMUNITY): Payer: Self-pay | Admitting: Nurse Practitioner

## 2022-04-11 NOTE — BH Assessment (Signed)
Care Management - Follow Up [place] Discharges  ? ?Patient has been placed in an inpatient substance abuse facility ([facility]).  ? ?

## 2022-04-12 NOTE — Progress Notes (Signed)
Patient ID: Wilferd Ritson, male   DOB: 05-Oct-1961, 61 y.o.   MRN: [id number]   Beni Turrell, is a 61 y.o. male  AJO:[id number]  NOB:[id number]  DOB - 05-24-1961  Chief Complaint  Patient presents with   Nausea    With weird taste in mouth for about 2 weeks        Subjective:   Draydon Clairmont is a 61 y.o. male here today for a follow up visit After ED visit 03/27/2022 for alcohol detox.  Labs at that time, TSH WNL, cholesterol of 400, magnesium WNL, CBC with WBC=3.2, LFT with minimal elevation, resp panel neg, urine dip and drug screen unremarkable.  (HIV was negative 11/2021).  He can't currently take atorvastatin due to liver fxn.  He is applying for financial assistance.  He has not drank alcohol since ED visit.    For the past 3 weeks he has been having midepigastric pain and nausea. Onset:  3 weeks ago Location: mid epigastric Duration: intermittent Characterization/quality:  gnawing, cramping Alleviating/aggravating:  Radiation: none Severity: moderate at times.    From ED note: Subjective:  Patient seen and chart reviewed- he has been medication compliant and has been appropriate with staff and peers. Most recent CIWA 2. He denied SI/HI/AVH. He denied  alcohol withdrawal sx of t nausea, vomiting, diaphoresis. He reports minimal tremor which has been much improved as well as diarrhea which is resolving.Discussed with patient that he will be provided with 7 day samples and 30 day prescriptions. Discussed open access hours at the [facility] health center and advised to follow up prior to running out of medications. He denies SI/HI/AVH and requests transportation to uncle's home in [place].   Stay Summary:  61 yo male with history of MDD, anxiety, AUD who presented to the [facility] on 03/27/22 for detox; he was admitted to the [facility] for detox and crisis stabilization. Etoh 423; UDS negative. On admission , patient was restarted on his previous home medications of lexapro 20 mg,  gabapentin 100 mg tID, trazodone 50 mg qhs as well as CIWA protocol. Patient had two CIWA readings >10 and was started on ativan taper on 03/28/22 which ended 03/31/22. After initiation of ativan taper, CIWA scores steadily decreased and ranged from 8-0. Gabapentin was increased from 100 mg TID to 200 mg TID on 03/28/22 due to ongoing anxiety and tremors. Mucinex was started on 5/3 for congestion which improved throughout his admission. Patient verbalized readiness for discharge on 03/31/22 after completion of ativan taper. Most recent CIWA 2 (tremor). Patient requested resources for residenital treatment on discharge as well as outpatient resources for mental health. Discussed open access hours at the [facility] health center.    On my interview day of discharge,  patient is in NAD, alert, oriented, calm, cooperative, and attentive, with normal affect, speech, and behavior. Objectively, there is no evidence of psychosis/ mania (able to converse coherently, linear and goal directed thought, no RIS, no distractibility, not pre-occupied, no FOI, etc) nor depression to the point of suicidality (able to concentrate, affect full and reactive, speech normal r/v/t, no psychomotor retardation/agitation, etc).   No problems updated.  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 Authorizing Provider  Multiple Vitamin (MULTIVITAMIN WITH MINERALS) TABS tablet Take 1 tablet by mouth daily. 03/30/22  Yes Ival Bible, MD  atorvastatin (LIPITOR) 20 MG tablet Take 1 tablet (20 mg total) by mouth daily. Patient not taking: Reported on 04/13/2022 03/30/22 04/29/22  Ival Bible, MD  escitalopram (LEXAPRO) 20 MG tablet Take 1 tablet (20 mg total) by mouth daily. 04/13/22 05/13/22  Argentina Donovan, PA-C  gabapentin (NEURONTIN) 100 MG capsule Take 2 capsules (200 mg total) by mouth 3 (three) times daily. 04/13/22   Argentina Donovan, PA-C  thiamine 100 MG tablet Take 1 tablet (100 mg total) by mouth daily. Patient not taking: Reported on 04/13/2022 03/31/22   Ival Bible, MD  traZODone (DESYREL) 50 MG tablet Take 1 tablet (50 mg total) by mouth at bedtime as needed for sleep. 04/13/22 05/13/22  Argentina Donovan, PA-C    ROS: Neg HEENT Neg resp Neg cardiac Neg GU Neg MS Neg psych Neg neuro  Objective:   Vitals:   04/13/22 0849  BP: 106/64  Pulse: 82  Temp: (!) 97.5 F (36.4 C)  TempSrc: Oral  SpO2: 96%  Weight: 147 lb 9.6 oz (67 kg)   Exam General appearance : Awake, alert, not in any distress. Speech Clear. Not toxic looking HEENT: Atraumatic and Normocephalic Neck: Supple, no JVD. No cervical lymphadenopathy.  Chest: Good air entry bilaterally, CTAB.  No rales/rhonchi/wheezing CVS: S1 S2 regular, no murmurs.  Abdomen: Bowel sounds present, Non tender and not distended with no gaurding, rigidity or rebound. Extremities: B/L Lower Ext shows no edema, both legs are warm to touch Neurology: Awake alert, and oriented X 3, CN II-XII intact, Non focal Skin: No Rash  Data Review Lab Results  Component Value Date   HGBA1C 5.6 03/27/2022   HGBA1C 5.6 12/27/2021   HGBA1C 5.5 08/18/2021    Assessment & Plan   1. Alcohol dependence with alcohol-induced mood disorder ([place]) [place].org is the website  for narcotics anonymous [place].dk (website) or 747-840-8315 is the information for alcoholics anonymous Both are free and immediately available for help with alcohol and drug use I have counseled the patient at length about substance abuse and addiction.  12 step meetings/recovery recommended.  Local 12 step meeting lists were given and attendance was encouraged.  Patient expresses understanding.  - Comprehensive metabolic panel - Lipase  2. Dyslipidemia, goal LDL below 100 Cholesterol has been VERY high, but can't currently due to elevated LFT - Lipid panel  4. Nausea Bland diet.  Continue to avoid alcohol  5. Encounter for examination following treatment at hospital  6. Anxiety and depression Stable/improving off alcohol - escitalopram (LEXAPRO) 20 MG tablet; Take 1 tablet (20 mg total) by mouth daily.  Dispense: 30 tablet; Refill: 3 - gabapentin (NEURONTIN) 100 MG capsule; Take 2 capsules (200 mg total) by mouth 3 (three) times daily.  Dispense: 180 capsule; Refill: 3 BH resources given  7. Neutropenia, unspecified type ([place]) HIV neg 11/2021 - CBC with  Differential/Platelet  8. Epigastric abdominal pain Non-acute abdomen - Lipase - H. pylori breath test  9. Neuropathy - gabapentin (NEURONTIN) 100 MG capsule; Take 2 capsules (200 mg total) by mouth 3 (three) times daily.  Dispense: 180 capsule; Refill: 3  10. Insomnia, unspecified type - traZODone (DESYREL) 50 MG tablet; Take 1 tablet (50 mg total) by mouth at bedtime as needed for sleep.  Dispense: 30 tablet; Refill: 3    Return in about 2 months (around 06/13/2022) for PCP for chronic conditions.  The patient was given clear instructions to go to ER or return to medical center if symptoms don't improve, worsen or new problems develop. The patient verbalized understanding. The patient was told to call to get lab results if they haven't heard anything in the next week.      Freeman Caldron, PA-C [facility] [place], [place]   04/13/2022, 9:14 AM

## 2022-04-13 ENCOUNTER — Other Ambulatory Visit: Payer: Self-pay

## 2022-04-13 ENCOUNTER — Encounter: Payer: Self-pay | Admitting: Physician Assistant

## 2022-04-13 ENCOUNTER — Ambulatory Visit: Payer: Self-pay | Attending: Physician Assistant | Admitting: Physician Assistant

## 2022-04-13 VITALS — BP 106/64 | HR 82 | Temp 97.5°F | Wt 147.6 lb

## 2022-04-13 DIAGNOSIS — R110 Nausea: Secondary | ICD-10-CM

## 2022-04-13 DIAGNOSIS — E785 Hyperlipidemia, unspecified: Secondary | ICD-10-CM

## 2022-04-13 DIAGNOSIS — G629 Polyneuropathy, unspecified: Secondary | ICD-10-CM

## 2022-04-13 DIAGNOSIS — D709 Neutropenia, unspecified: Secondary | ICD-10-CM

## 2022-04-13 DIAGNOSIS — R1013 Epigastric pain: Secondary | ICD-10-CM

## 2022-04-13 DIAGNOSIS — Z09 Encounter for follow-up examination after completed treatment for conditions other than malignant neoplasm: Secondary | ICD-10-CM

## 2022-04-13 DIAGNOSIS — F1024 Alcohol dependence with alcohol-induced mood disorder: Secondary | ICD-10-CM

## 2022-04-13 DIAGNOSIS — G4700 Insomnia, unspecified: Secondary | ICD-10-CM

## 2022-04-13 DIAGNOSIS — F32A Depression, unspecified: Secondary | ICD-10-CM

## 2022-04-13 DIAGNOSIS — F419 Anxiety disorder, unspecified: Secondary | ICD-10-CM

## 2022-04-13 MED ORDER — ESCITALOPRAM OXALATE 20 MG PO TABS
20.0000 mg | ORAL_TABLET | Freq: Every day | ORAL | 3 refills | Status: DC
Start: 2022-04-13 — End: 2022-11-13
  Filled 2022-04-13 – 2022-08-09 (×3): qty 30, 30d supply, fill #0

## 2022-04-13 MED ORDER — TRAZODONE HCL 50 MG PO TABS
50.0000 mg | ORAL_TABLET | Freq: Every evening | ORAL | 3 refills | Status: DC | PRN
  Filled 2022-04-13: qty 30, 30d supply, fill #0

## 2022-04-13 MED ORDER — GABAPENTIN 100 MG PO CAPS
200.0000 mg | ORAL_CAPSULE | Freq: Three times a day (TID) | ORAL | 3 refills | Status: DC
Start: 2022-04-13 — End: 2023-02-21
  Filled 2022-04-13 – 2022-05-11 (×2): qty 180, 30d supply, fill #0
  Filled 2022-06-29: qty 180, 30d supply, fill #1

## 2022-04-13 NOTE — Patient Instructions (Addendum)
[place].org is the website for narcotics anonymous  [place].dk (website) or 530-730-2358 is the information for alcoholics anonymous  Both are free and immediately available for help with alcohol and drug use   High Cholesterol  High cholesterol is a condition in which the blood has high levels of a white, waxy substance similar to fat (cholesterol). The liver makes all the cholesterol that the body needs. The human body needs small amounts of cholesterol to help build cells. A person gets extra or excess cholesterol from the food that he or she eats. The blood carries cholesterol from the liver to the rest of the body. If you have high cholesterol, deposits (plaques) may build up on the walls of your arteries. Arteries are the blood vessels that carry blood away from your heart. These plaques make the arteries narrow and stiff. Cholesterol plaques increase your risk for heart attack and stroke. Work with your health care provider to keep your cholesterol levels in a healthy range. What increases the risk? The following factors may make you more likely to develop this condition: Eating foods that are high in animal fat (saturated fat) or cholesterol. Being overweight. Not getting enough exercise. A family history of high cholesterol (familial hypercholesterolemia). Use of tobacco products. Having diabetes. What are the signs or symptoms? In most cases, high cholesterol does not usually cause any symptoms. In severe cases, very high cholesterol levels can cause: Fatty bumps under the skin (xanthomas). A white or gray ring around the black center (pupil) of the eye. How is this diagnosed? This condition may be diagnosed based on the results of a blood test. If you are older than 60 years of age, your health care provider may check your cholesterol levels every 4-6 years. You may be checked more often if you have high cholesterol or other risk factors for heart disease. The blood test  for cholesterol measures: "Bad" cholesterol, or LDL cholesterol. This is the main type of cholesterol that causes heart disease. The desired level is less than 100 mg/dL (2.59 mmol/L). "Good" cholesterol, or HDL cholesterol. HDL helps protect against heart disease by cleaning the arteries and carrying the LDL to the liver for processing. The desired level for HDL is 60 mg/dL (1.55 mmol/L) or higher. Triglycerides. These are fats that your body can store or burn for energy. The desired level is less than 150 mg/dL (1.69 mmol/L). Total cholesterol. This measures the total amount of cholesterol in your blood and includes LDL, HDL, and triglycerides. The desired level is less than 200 mg/dL (5.17 mmol/L). How is this treated? Treatment for high cholesterol starts with lifestyle changes, such as diet and exercise. Diet changes. You may be asked to eat foods that have more fiber and less saturated fats or added sugar. Lifestyle changes. These may include regular exercise, maintaining a healthy weight, and quitting use of tobacco products. Medicines. These are given when diet and lifestyle changes have not worked. You may be prescribed a statin medicine to help lower your cholesterol levels. Follow these instructions at home: Eating and drinking  Eat a healthy, balanced diet. This diet includes: Daily servings of a variety of fresh, frozen, or canned fruits and vegetables. Daily servings of whole grain foods that are rich in fiber. Foods that are low in saturated fats and trans fats. These include poultry and fish without skin, lean cuts of meat, and low-fat dairy products. A variety of fish, especially oily fish that contain omega-3 fatty acids. Aim to eat fish at  least 2 times a week. Avoid foods and drinks that have added sugar. Use healthy cooking methods, such as roasting, grilling, broiling, baking, poaching, steaming, and stir-frying. Do not fry your food except for stir-frying. If you drink  alcohol: Limit how much you have to: 0-1 drink a day for women who are not pregnant. 0-2 drinks a day for men. Know how much alcohol is in a drink. In the U.S., one drink equals one 12 oz bottle of beer (355 mL), one 5 oz glass of wine (148 mL), or one 1 oz glass of hard liquor (44 mL). Lifestyle  Get regular exercise. Aim to exercise for a total of 150 minutes a week. Increase your activity level by doing activities such as gardening, walking, and taking the stairs. Do not use any products that contain nicotine or tobacco. These products include cigarettes, chewing tobacco, and vaping devices, such as e-cigarettes. If you need help quitting, ask your health care provider. General instructions Take over-the-counter and prescription medicines only as told by your health care provider. Keep all follow-up visits. This is important. Where to find more information American Heart Association: www.heart.org National Heart, Lung, and Blood Institute: https://wilson-eaton.com/ Contact a health care provider if: You have trouble achieving or maintaining a healthy diet or weight. You are starting an exercise program. You are unable to stop smoking. Get help right away if: You have chest pain. You have trouble breathing. You have discomfort or pain in your jaw, neck, back, shoulder, or arm. You have any symptoms of a stroke. "BE FAST" is an easy way to remember the main warning signs of a stroke: B - Balance. Signs are dizziness, sudden trouble walking, or loss of balance. E - Eyes. Signs are trouble seeing or a sudden change in vision. F - Face. Signs are sudden weakness or numbness of the face, or the face or eyelid drooping on one side. A - Arms. Signs are weakness or numbness in an arm. This happens suddenly and usually on one side of the body. S - Speech. Signs are sudden trouble speaking, slurred speech, or trouble understanding what people say. T - Time. Time to call emergency services. Write down  what time symptoms started. You have other signs of a stroke, such as: A sudden, severe headache with no known cause. Nausea or vomiting. Seizure. These symptoms may represent a serious problem that is an emergency. Do not wait to see if the symptoms will go away. Get medical help right away. Call your local emergency services (911 in the U.S.). Do not drive yourself to the hospital. Summary Cholesterol plaques increase your risk for heart attack and stroke. Work with your health care provider to keep your cholesterol levels in a healthy range. Eat a healthy, balanced diet, get regular exercise, and maintain a healthy weight. Do not use any products that contain nicotine or tobacco. These products include cigarettes, chewing tobacco, and vaping devices, such as e-cigarettes. Get help right away if you have any symptoms of a stroke. This information is not intended to replace advice given to you by your health care provider. Make sure you discuss any questions you have with your health care provider. Document Revised: 01/27/2021 Document Reviewed: 01/17/2021 Elsevier Patient Education  [place] resources-call for appt [facility] [address] [place], [place] [address] [phone] or [phone] Walk-in urgent care 24/7 for anyone  For [facility] ONLY New patient assessments and therapy  walk-ins: Monday and Wednesday 8am-11am First and second Friday 1pm-5pm New patient psychiatry and medication management walk-ins: Mondays, Wednesdays, Thursdays, Fridays 8am-11am No psychiatry walk-ins on Tuesday  [facility] [address], [place], [place] [address] [phone] or [phone] Walk-in urgent care 24/7 for anyone  For [facility] ONLY New patient assessments and therapy walk-ins: Monday and Wednesday 8am-11am First and second Friday 1pm-5pm New patient psychiatry  and medication management walk-ins: Mondays, Wednesdays, Thursdays, Fridays 8am-11am No psychiatry walk-ins on Tuesday   *Accepts all insurance and uninsured for Urgent Care needs *Accepts Medicaid and uninsured for outpatient treatment   [place] (Therapy and psychiatry) [facility] (near [place]) [address] [place], [place] [address] [phone] Fax: [phone] ([place])   [place] at [place],  [place]  [address] [phone] Call for appointment  [facility] ([place]) of the [place] (Therapy only)  [facility] [address] [place], [place] [address] Monday - Friday: 8:30 a.m.-12 p.m. / 1 p.m.-2:30 p.m.  [facility] [address] [place], [place] [address] Monday-Friday: 8:30 a.m.-12 p.m. / 2-3:30 p.m. (INSURANCE REQUIRED -MEDICAID ACCEPTED) They do offer a sliding fee scale $20-$30/session   [facility] [place], [place] [address] Phone: [place] [address] [place] [address]  Phone: [phone] (Does not accept Medicaid) (only one provider accepts Medicare) [facility] [address] (at [employer]) [place], [place] [phone] (Accepts Medicaid and Medicare)  [facility]  [place] [address] [place]  [place], [place] [address]  Phone: [phone]  [address] [place], [place] [address] Phone: [phone] [facility] Medicaid) [facility] (Therapy only)  [address] [place], [place] [address] Phone: [phone]   [facility] [place] (Therapy only)  [place], [place] [address] Phone: [phone] [facility])   [place] [address] [place]  [place], [place] [address] Phone: [phone] ([place]) [employer] [phone] [address] [place], [place] [address] Phone: [phone] [place]) [facility] (Psychiatry only)  [phone] [address], [place], [place] [address] (Accepts Medicaid and Medicare) [place] (Psychiatry and therapy)  [place], [place], [place] [address] [phone] [facility] Medicare) [place] (psychiatry and therapy) [address], [place], [place] [address] [phone]  [facility] at [address], [place], [place], [place] [address] [phone] Accept [address], [place], [place], [place], [place],  and the following types of Medicaid; [place], [place], [place], [place], [place], [employer], Healthy [place], [place] Complete, and [place], as well as offering a Manufacturing systems engineer and private payment options. Provides In-Office Appointments, Virtual Appointments, and Phone Consultations Offers medication management for ages 32 years old and up, including,  Medication Management for Suboxone and [place] [phone] [address], [place], [place] [address] ([place] Medicaid and Medicare)         19.  [facility] (therapy only)  [address] [place], [place] [address]            [phone] (Accepts medicare) 20. [facility]  (Suboxone and methodone) [phone] [address], [place],  [address] To Be  Eligible for Opioid Treatment at [facility] you must be at least 60 years of age you have already tried other interventions that were not successful such as opioid detox, inpatient rehab for opioids, or outpatient counseling specifically for opioid dependency your [facility] drug test must be completely free of benzodiazepines (klonopin, xanax, valium, ativan, or other benz) you have reliable transportation to the [facility] clinic in  [place] you recognize that counseling is a critical component of [facility]' Opioid Program and you agree to attend all required counseling sessions you are committed to total drug abstinence and will conscientiously strive to remain free of alcohol, marijuana, and other illicit substances while in treatment you desire a peaceful treatment atmosphere in which personal responsibility and respect toward staff and clients is the norm   21. [facility] [place], [place], [place] [address] Offers SAIOP (Substance Abuse Intensive Outpatient Program) [phone] 22. [facility] [address] [place], [place] [address] [phone] (Accepts medicare)  For those who are tech savvy, go on psychology today, type in your local city (i.e. [place]. [place]) and specify your insurance at the top of the screen after you search. (Medicaid if needed). You can also specify whether you are interested in therapy and psychiatry.  www.psychologytoday.com/us

## 2022-04-14 ENCOUNTER — Other Ambulatory Visit: Payer: Self-pay

## 2022-04-14 LAB — LIPID PANEL
Chol/HDL Ratio: 2.4 ratio (ref 0.0–5.0)
Cholesterol, Total: 266 mg/dL — ABNORMAL HIGH (ref 100–199)
HDL: 109 mg/dL (ref >39)
LDL Chol Calc (NIH): 142 mg/dL — ABNORMAL HIGH (ref 0–99)
Triglycerides: 92 mg/dL (ref 0–149)
VLDL Cholesterol Cal: 15 mg/dL (ref 5–40)

## 2022-04-14 LAB — CBC WITH DIFFERENTIAL/PLATELET
Basophils Absolute: 0.1 10*3/uL (ref 0.0–0.2)
Basos: 1 %
EOS (ABSOLUTE): 0.1 10*3/uL (ref 0.0–0.4)
Eos: 2 %
Hematocrit: 39.1 % (ref 37.5–51.0)
Hemoglobin: 13 g/dL (ref 13.0–17.7)
Immature Grans (Abs): 0 10*3/uL (ref 0.0–0.1)
Immature Granulocytes: 0 %
Lymphocytes Absolute: 1.5 10*3/uL (ref 0.7–3.1)
Lymphs: 20 %
MCH: 27.8 pg (ref 26.6–33.0)
MCHC: 33.2 g/dL (ref 31.5–35.7)
MCV: 84 fL (ref 79–97)
Monocytes Absolute: 0.8 10*3/uL (ref 0.1–0.9)
Monocytes: 11 %
Neutrophils Absolute: 4.8 10*3/uL (ref 1.4–7.0)
Neutrophils: 66 %
Platelets: 271 10*3/uL (ref 150–450)
RBC: 4.68 x10E6/uL (ref 4.14–5.80)
RDW: 14.7 % (ref 11.6–15.4)
WBC: 7.3 10*3/uL (ref 3.4–10.8)

## 2022-04-14 LAB — COMPREHENSIVE METABOLIC PANEL
ALT: 22 IU/L (ref 0–44)
AST: 24 IU/L (ref 0–40)
Albumin/Globulin Ratio: 1.5 (ref 1.2–2.2)
Albumin: 4.6 g/dL (ref 3.8–4.9)
Alkaline Phosphatase: 44 IU/L (ref 44–121)
BUN/Creatinine Ratio: 7 — ABNORMAL LOW (ref 10–24)
BUN: 9 mg/dL (ref 8–27)
Bilirubin Total: 0.2 mg/dL (ref 0.0–1.2)
CO2: 23 mmol/L (ref 20–29)
Calcium: 9.5 mg/dL (ref 8.6–10.2)
Chloride: 103 mmol/L (ref 96–106)
Creatinine, Ser: 1.37 mg/dL — ABNORMAL HIGH (ref 0.76–1.27)
Globulin, Total: 3 g/dL (ref 1.5–4.5)
Glucose: 87 mg/dL (ref 70–99)
Potassium: 4.2 mmol/L (ref 3.5–5.2)
Sodium: 140 mmol/L (ref 134–144)
Total Protein: 7.6 g/dL (ref 6.0–8.5)
eGFR: 59 mL/min/{1.73_m2} — ABNORMAL LOW (ref >59)

## 2022-04-14 LAB — H. PYLORI BREATH TEST: H pylori Breath Test: NEGATIVE

## 2022-04-14 LAB — LIPASE: Lipase: 86 U/L — ABNORMAL HIGH (ref 13–78)

## 2022-04-20 ENCOUNTER — Other Ambulatory Visit: Payer: Self-pay

## 2022-05-11 ENCOUNTER — Other Ambulatory Visit: Payer: Self-pay

## 2022-05-20 ENCOUNTER — Emergency Department (HOSPITAL_BASED_OUTPATIENT_CLINIC_OR_DEPARTMENT_OTHER)
Admission: EM | Admit: 2022-05-20 | Discharge: 2022-05-20 | Disposition: A | Discharge status: Home / Self Care | Payer: Self-pay | Attending: Emergency Medicine | Admitting: Emergency Medicine

## 2022-05-20 ENCOUNTER — Encounter (HOSPITAL_BASED_OUTPATIENT_CLINIC_OR_DEPARTMENT_OTHER): Payer: Self-pay | Admitting: Emergency Medicine

## 2022-05-20 ENCOUNTER — Emergency Department (HOSPITAL_BASED_OUTPATIENT_CLINIC_OR_DEPARTMENT_OTHER): Payer: Self-pay

## 2022-05-20 ENCOUNTER — Other Ambulatory Visit: Payer: Self-pay

## 2022-05-20 DIAGNOSIS — M25562 Pain in left knee: Secondary | ICD-10-CM | POA: Insufficient documentation

## 2022-05-20 DIAGNOSIS — L299 Pruritus, unspecified: Secondary | ICD-10-CM | POA: Insufficient documentation

## 2022-05-20 MED ORDER — DIPHENHYDRAMINE HCL 25 MG PO TABS: 25.0000 mg | ORAL_TABLET | Freq: Four times a day (QID) | ORAL | 0 refills | Status: DC | PRN

## 2022-05-31 ENCOUNTER — Encounter: Payer: Self-pay | Admitting: Physician Assistant

## 2022-05-31 ENCOUNTER — Ambulatory Visit: Payer: Self-pay | Admitting: Physician Assistant

## 2022-05-31 VITALS — BP 110/71 | HR 72 | Resp 18 | Ht 69.0 in | Wt 149.0 lb

## 2022-05-31 DIAGNOSIS — E7849 Other hyperlipidemia: Secondary | ICD-10-CM

## 2022-05-31 DIAGNOSIS — B354 Tinea corporis: Secondary | ICD-10-CM

## 2022-05-31 MED ORDER — NYSTATIN-TRIAMCINOLONE 100000-0.1 UNIT/GM-% EX OINT: 1.0000 | TOPICAL_OINTMENT | Freq: Two times a day (BID) | CUTANEOUS | 0 refills | Status: DC

## 2022-05-31 NOTE — Patient Instructions (Addendum)
To help with your rash, you are going to use Mycolog twice a day until resolved.  You can also purchase [facility] and use that twice a week to help prevent future breakouts, Goldbond powder can also be very helpful.  Your liver enzymes are within normal limits, I do encourage you to do a trial of your Lipitor, take half a tablet or 10 mg once daily.  You have an appointment with your primary care provider in 6 weeks, she may want to retest your liver enzymes at that time.  Please let us know if there is anything else we can do for you  Kennieth Rad, PA-C Physician Assistant [place] [email or web address]  Tinea Versicolor  Tinea versicolor is a common fungal infection. It causes a rash that looks like light or dark patches on the skin. The rash most often occurs on the chest, back, neck, or upper arms. This condition is more common during warm weather. Tinea versicolor usually does not cause any other problems than the rash. In most cases, the infection goes away in a few weeks with treatment. It may take a few months for the patches on your skin to return to your usual skin color. What are the causes? This condition occurs when a certain type of fungus (Malassezia furfur) that is normally present on the skin starts to grow too much. This fungus is a type of yeast. This condition cannot be passed from one person to another (is not contagious). What increases the risk? This condition is more likely to develop when certain factors are present, such as: Heat and humidity. Sweating too much. Hormone changes, such as those that occur when taking birth control pills. Oily skin. A weak disease-fighting system (immunesystem). What are the signs or symptoms? Symptoms of this condition include: A rash of light or dark patches on your skin. The rash may have: Patches of tan or pink spots (on light skin). Patches of white or brown spots (on  dark skin). Patches of skin that do not tan. Well-marked edges. Scales on the discolored areas. Mild itching. There may also be no itching. How is this diagnosed? A health care provider can usually diagnose this condition by looking at your skin. During the exam, he or she may use ultraviolet (UV) light to see how much of your skin has been affected. In some cases, a skin sample may be taken by scraping the rash. This sample will be viewed under a microscope to check for yeast overgrowth. How is this treated? Treatment for this condition may include: Dandruff shampoo that is applied to the affected skin during showers or bathing. Over-the-counter medicated skin cream, lotion, or soaps. Prescription antifungal medicine in the form of skin cream or pills. Medicine to help reduce itching. Follow these instructions at home: Use over-the-counter and prescription medicines only as told by your health care provider. Apply dandruff shampoo to the affected area as told by your health care provider. Do not scratch the affected area of skin. Avoid hot and humid conditions. Do not use tanning booths. Try to avoid sweating a lot. Contact a health care provider if: Your symptoms get worse. You have a fever. You have signs of infection such as: Redness, swelling, or pain at the site of your rash. Warmth coming from your rash. Fluid or blood coming from your rash. Pus or a bad smell coming from your rash. Your rash comes back (recurs) after treatment. Your rash does not improve with treatment and  spreads to other parts of the body. Summary Tinea versicolor is a common fungal infection of the skin. It causes a rash that looks like light or dark patches on the skin. The rash most often occurs on the chest, back, neck, or upper arms. A health care provider can usually diagnose this condition by looking at your skin. Treatment may include applying shampoo to the skin and taking or applying  medicines. This information is not intended to replace advice given to you by your health care provider. Make sure you discuss any questions you have with your health care provider. Document Revised: 02/01/2021 Document Reviewed: 02/01/2021 Elsevier Patient Education  [place].

## 2022-05-31 NOTE — Progress Notes (Signed)
Established Patient Office Visit  Subjective   Patient ID: [name] [name], male    DOB: Apr 25, 1961  Age: 61 y.o. MRN: [id number]  Chief Complaint  Patient presents with   Rash    Right buttocks and up the right waist and back    States that he started having a rash on his right buttock and it has extended up his right waist and back for the past couple of weeks.  Describes it as itchy   Denies any new medications, lotions, shampoos, detergents.  Does endorse that he started using a new body wash, states that he has been using that for the past 3 months.  Does endorse becoming sweaty frequently throughout the day.  States that he has tried over-the-counter cortisone without much relief. 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   Social History   Socioeconomic History   Marital status: Legally Separated    Spouse name: Not on file   Number of children: 2   Years of education: 13 yrs   Highest education level: Not on file  Occupational History   Occupation: custodian    Comment: [employer]  Tobacco Use   Smoking status: Never   Smokeless tobacco: Never  Vaping Use   Vaping Use: Never used  Substance and Sexual Activity   Alcohol use: Yes    Comment: clean for 4 months   Drug use: Not Currently    Types: Marijuana    Comment: Patient denies    Sexual activity: Not Currently  Other Topics Concern   Not on file  Social History Narrative   Born and raised in [place], [place] by both parents. He has 2 sisters and  is the middle child. He remains close with family. He was married for 16 yrs and seperated in 2005. He has 2 boys who are in their 20's now. He lives alone in [place]. He is working as a Charity fundraiser handed    Lives in a one story home    Social Determinants of Radio broadcast assistant Strain: Not on file  Food Insecurity: Not on file  Transportation Needs: Not on file  Physical Activity: Not on file  Stress: Not on file  Social Connections: Not on file  Intimate Partner Violence: Not on file   Family History  Problem Relation Age of Onset   Diabetes Father    Hypertension Father    Hypertension Paternal Grandfather    Alcohol abuse Paternal Grandfather    Alcohol abuse Maternal Grandfather    Alcohol abuse Maternal Grandmother    Alcohol abuse Paternal Grandmother    Colon cancer Neg Hx    Rectal cancer Neg Hx    Stomach cancer Neg Hx    Bipolar disorder Neg Hx    Depression Neg Hx    Allergies  Allergen Reactions   Oxycodone Other (See Comments)  abd pain, stomach cramps     Review of Systems  Constitutional: Negative.   HENT: Negative.    Eyes: Negative.   Respiratory:  Negative for shortness of breath.   Cardiovascular:  Negative for chest pain.  Gastrointestinal: Negative.   Genitourinary: Negative.   Musculoskeletal: Negative.   Skin:  Positive for itching and rash.  Neurological: Negative.   Endo/Heme/Allergies: Negative.       Objective:     BP 110/71 (BP Location: Left Arm, Patient Position: Sitting, Cuff Size: Normal)   Pulse 72   Resp 18   Ht '5\' 9"'$  (1.753 m)   Wt 149 lb (67.6 kg)   SpO2 99%   BMI 22.00 kg/m    Physical Exam Vitals and nursing note reviewed.  Constitutional:      Appearance: Normal appearance.  HENT:     Head: Normocephalic and atraumatic.     Right Ear: External ear normal.     Left Ear: External ear normal.     Nose: Nose normal.     Mouth/Throat:     Mouth: Mucous membranes are moist.     Pharynx:  Oropharynx is clear.  Eyes:     Extraocular Movements: Extraocular movements intact.     Conjunctiva/sclera: Conjunctivae normal.     Pupils: Pupils are equal, round, and reactive to light.  Cardiovascular:     Rate and Rhythm: Regular rhythm.     Pulses: Normal pulses.     Heart sounds: Normal heart sounds.  Pulmonary:     Effort: Pulmonary effort is normal.     Breath sounds: Normal breath sounds.  Musculoskeletal:     Cervical back: Normal range of motion and neck supple.  Skin:    General: Skin is warm.     Findings: Rash present. Rash is not crusting, pustular, scaling or vesicular.     Comments: Darkened areas on right buttocks and lower right back. Small white patches in lower and upper middle back  Neurological:     General: No focal deficit present.     Mental Status: He is alert and oriented to person, place, and time.  Psychiatric:        Mood and Affect: Mood normal.        Behavior: Behavior normal.        Thought Content: Thought content normal.        Judgment: Judgment normal.        Assessment & Plan:   Problem List Items Addressed This Visit       Other   Other hyperlipidemia   Other Visit Diagnoses     Tinea of the body    -  Primary   Relevant Medications   nystatin-triamcinolone ointment (MYCOLOG)      1. Tinea of the body Trial Mycolog.  Patient education given on supportive care.  Red flags given for prompt reevaluation - nystatin-triamcinolone ointment (MYCOLOG); Apply 1 Application topically 2 (two) times daily.  Dispense: 30 g; Refill: 0  2. Other hyperlipidemia Reviewed lab results from last office visit in May with patient.  Liver enzymes are back within normal limits.  Patient did state that he was taken off the Lipitor before due to elevated liver enzymes, states that he was also drinking alcohol at that time.  States that he has never taken Lipitor without also drinking alcohol.  States that he has been sober for 6  months.  Encouraged patient to do trial of Lipitor, starting with half a tablet or  10 mg.  Patient does have supply of 20 mg Lipitor.  Patient has follow-up appointment with primary care provider in 6 weeks.  Patient encouraged to return to mobile unit if needed.   I have reviewed the patient's medical history (PMH, PSH, Social History, Family History, Medications, and allergies) , and have been updated if relevant. I spent 30 minutes reviewing chart and  face to face time with patient.    Return if symptoms worsen or fail to improve.    Loraine Grip Mayers, PA-C

## 2022-06-21 ENCOUNTER — Other Ambulatory Visit: Payer: Self-pay

## 2022-06-29 ENCOUNTER — Other Ambulatory Visit: Payer: Self-pay

## 2022-07-12 ENCOUNTER — Ambulatory Visit: Payer: Self-pay | Admitting: Nurse Practitioner

## 2022-08-09 ENCOUNTER — Other Ambulatory Visit: Payer: Self-pay

## 2022-08-11 ENCOUNTER — Other Ambulatory Visit: Payer: Self-pay

## 2022-08-14 ENCOUNTER — Other Ambulatory Visit: Payer: Self-pay

## 2022-09-14 ENCOUNTER — Encounter: Payer: Self-pay | Admitting: Family Medicine

## 2022-09-14 ENCOUNTER — Telehealth: Payer: Self-pay | Admitting: Nurse Practitioner

## 2022-09-14 NOTE — Telephone Encounter (Signed)
Pt is requesting to transfer FROM: Dr. Raul Del  Pt is requesting to transfer TO: Dr. Anitra Lauth   Reason for requested transfer:Location is closer to where he has recently moved to in [facility] contact number:[phone]

## 2022-09-14 NOTE — Telephone Encounter (Signed)
Ok with me 

## 2022-09-14 NOTE — Telephone Encounter (Signed)
Please assist patient with scheduling, thanks. 

## 2022-09-27 ENCOUNTER — Ambulatory Visit: Payer: Self-pay | Admitting: Nurse Practitioner

## 2022-10-03 NOTE — Telephone Encounter (Signed)
Patient is scheduled with Dr. Berniece Pap at [facility]

## 2022-10-12 ENCOUNTER — Ambulatory Visit: Payer: Self-pay | Admitting: Internal Medicine

## 2022-10-25 ENCOUNTER — Ambulatory Visit: Payer: Self-pay | Attending: Critical Care Medicine | Admitting: Critical Care Medicine

## 2022-10-25 ENCOUNTER — Encounter: Payer: Self-pay | Admitting: Critical Care Medicine

## 2022-10-25 ENCOUNTER — Ambulatory Visit: Payer: Self-pay | Admitting: Critical Care Medicine

## 2022-10-25 DIAGNOSIS — Z91199 Patient's noncompliance with other medical treatment and regimen due to unspecified reason: Secondary | ICD-10-CM

## 2022-10-25 NOTE — Progress Notes (Deleted)
   Established Patient Office Visit  Subjective   Patient ID: [name] [name], male    DOB: 03-03-1961  Age: 61 y.o. MRN: [id number]  No chief complaint on file.   HPI  {History (Optional):23778}  ROS    Objective:     There were no vitals taken for this visit. {Vitals History (Optional):23777}  Physical Exam   No results found for any visits on 10/25/22.  {Labs (Optional):23779}  The ASCVD Risk score (Arnett DK, et al., 2019) failed to calculate for the following reasons:   The valid HDL cholesterol range is 20 to 100 mg/dL    Assessment & Plan:   Problem List Items Addressed This Visit   None   No follow-ups on file.    Asencion Noble, MD

## 2022-10-25 NOTE — Progress Notes (Signed)
The patient will be made aware he did not answer the phone

## 2022-10-25 NOTE — Progress Notes (Deleted)
   Established Patient Office Visit  Subjective   Patient ID: [name] [name], male    DOB: 07/22/1961  Age: 61 y.o. MRN: [id number]  No chief complaint on file.   Raul Del PCP  ref from Psychiatry      {History (Optional):23778}  ROS    Objective:     There were no vitals taken for this visit. {Vitals History (Optional):23777}  Physical Exam   No results found for any visits on 10/25/22.  {Labs (Optional):23779}  The ASCVD Risk score (Arnett DK, et al., 2019) failed to calculate for the following reasons:   The valid HDL cholesterol range is 20 to 100 mg/dL    Assessment & Plan:   Problem List Items Addressed This Visit   None   No follow-ups on file.    Asencion Noble, MD

## 2022-11-09 ENCOUNTER — Encounter: Payer: Self-pay | Admitting: *Deleted

## 2022-11-10 ENCOUNTER — Other Ambulatory Visit: Payer: Self-pay

## 2022-11-10 ENCOUNTER — Emergency Department (HOSPITAL_COMMUNITY)
Admission: EM | Admit: 2022-11-10 | Discharge: 2022-11-11 | Disposition: A | Discharge status: Home / Self Care | Payer: Self-pay | Attending: Emergency Medicine | Admitting: Emergency Medicine

## 2022-11-10 ENCOUNTER — Emergency Department (HOSPITAL_COMMUNITY): Payer: Self-pay

## 2022-11-10 ENCOUNTER — Other Ambulatory Visit (HOSPITAL_COMMUNITY)
Admission: EM | Admit: 2022-11-10 | Discharge: 2022-11-10 | Disposition: A | Discharge status: Other Acute Inpatient Hospital | Payer: No Payment, Other | Attending: Psychiatry | Admitting: Psychiatry

## 2022-11-10 ENCOUNTER — Encounter (HOSPITAL_COMMUNITY): Payer: Self-pay

## 2022-11-10 DIAGNOSIS — F1094 Alcohol use, unspecified with alcohol-induced mood disorder: Secondary | ICD-10-CM | POA: Diagnosis present

## 2022-11-10 DIAGNOSIS — G621 Alcoholic polyneuropathy: Secondary | ICD-10-CM | POA: Insufficient documentation

## 2022-11-10 DIAGNOSIS — Z1152 Encounter for screening for COVID-19: Secondary | ICD-10-CM | POA: Diagnosis not present

## 2022-11-10 DIAGNOSIS — F329 Major depressive disorder, single episode, unspecified: Secondary | ICD-10-CM | POA: Diagnosis not present

## 2022-11-10 DIAGNOSIS — R1110 Vomiting, unspecified: Secondary | ICD-10-CM | POA: Insufficient documentation

## 2022-11-10 DIAGNOSIS — F1021 Alcohol dependence, in remission: Secondary | ICD-10-CM

## 2022-11-10 DIAGNOSIS — Y906 Blood alcohol level of 120-199 mg/100 ml: Secondary | ICD-10-CM | POA: Insufficient documentation

## 2022-11-10 DIAGNOSIS — F332 Major depressive disorder, recurrent severe without psychotic features: Secondary | ICD-10-CM | POA: Diagnosis present

## 2022-11-10 DIAGNOSIS — D860 Sarcoidosis of lung: Secondary | ICD-10-CM | POA: Diagnosis not present

## 2022-11-10 DIAGNOSIS — F10120 Alcohol abuse with intoxication, uncomplicated: Secondary | ICD-10-CM | POA: Insufficient documentation

## 2022-11-10 DIAGNOSIS — R7989 Other specified abnormal findings of blood chemistry: Secondary | ICD-10-CM

## 2022-11-10 DIAGNOSIS — I10 Essential (primary) hypertension: Secondary | ICD-10-CM | POA: Insufficient documentation

## 2022-11-10 DIAGNOSIS — F10920 Alcohol use, unspecified with intoxication, uncomplicated: Secondary | ICD-10-CM

## 2022-11-10 DIAGNOSIS — R0789 Other chest pain: Secondary | ICD-10-CM | POA: Insufficient documentation

## 2022-11-10 DIAGNOSIS — F1024 Alcohol dependence with alcohol-induced mood disorder: Secondary | ICD-10-CM | POA: Diagnosis present

## 2022-11-10 DIAGNOSIS — F411 Generalized anxiety disorder: Secondary | ICD-10-CM | POA: Diagnosis present

## 2022-11-10 LAB — CBC WITH DIFFERENTIAL/PLATELET
Abs Immature Granulocytes: 0.02 10*3/uL (ref 0.00–0.07)
Abs Immature Granulocytes: 0.03 10*3/uL (ref 0.00–0.07)
Basophils Absolute: 0.1 10*3/uL (ref 0.0–0.1)
Basophils Absolute: 0.1 10*3/uL (ref 0.0–0.1)
Basophils Relative: 1 %
Basophils Relative: 1 %
Eosinophils Absolute: 0 10*3/uL (ref 0.0–0.5)
Eosinophils Absolute: 0.1 10*3/uL (ref 0.0–0.5)
Eosinophils Relative: 0 %
Eosinophils Relative: 1 %
HCT: 42.1 % (ref 39.0–52.0)
HCT: 40.7 % (ref 39.0–52.0)
Hemoglobin: 14.1 g/dL (ref 13.0–17.0)
Hemoglobin: 13.3 g/dL (ref 13.0–17.0)
Immature Granulocytes: 0 %
Immature Granulocytes: 0 %
Lymphocytes Relative: 18 %
Lymphocytes Relative: 41 %
Lymphs Abs: 1.2 10*3/uL (ref 0.7–4.0)
Lymphs Abs: 4.2 10*3/uL — ABNORMAL HIGH (ref 0.7–4.0)
MCH: 26.2 pg (ref 26.0–34.0)
MCH: 26 pg (ref 26.0–34.0)
MCHC: 33.5 g/dL (ref 30.0–36.0)
MCHC: 32.7 g/dL (ref 30.0–36.0)
MCV: 78.3 fL — ABNORMAL LOW (ref 80.0–100.0)
MCV: 79.6 fL — ABNORMAL LOW (ref 80.0–100.0)
Monocytes Absolute: 0.6 10*3/uL (ref 0.1–1.0)
Monocytes Absolute: 1.2 10*3/uL — ABNORMAL HIGH (ref 0.1–1.0)
Monocytes Relative: 9 %
Monocytes Relative: 12 %
Neutro Abs: 4.6 10*3/uL (ref 1.7–7.7)
Neutro Abs: 4.5 10*3/uL (ref 1.7–7.7)
Neutrophils Relative %: 72 %
Neutrophils Relative %: 45 %
Platelets: 201 10*3/uL (ref 150–400)
Platelets: 195 10*3/uL (ref 150–400)
RBC: 5.38 MIL/uL (ref 4.22–5.81)
RBC: 5.11 MIL/uL (ref 4.22–5.81)
RDW: 17.2 % — ABNORMAL HIGH (ref 11.5–15.5)
RDW: 17.1 % — ABNORMAL HIGH (ref 11.5–15.5)
WBC: 6.5 10*3/uL (ref 4.0–10.5)
WBC: 10.1 10*3/uL (ref 4.0–10.5)
nRBC: 0 % (ref 0.0–0.2)
nRBC: 0 % (ref 0.0–0.2)

## 2022-11-10 LAB — POCT URINE DRUG SCREEN - MANUAL ENTRY (I-SCREEN)
POC Amphetamine UR: NOT DETECTED
POC Buprenorphine (BUP): NOT DETECTED
POC Cocaine UR: NOT DETECTED
POC Marijuana UR: NOT DETECTED
POC Methadone UR: NOT DETECTED
POC Methamphetamine UR: NOT DETECTED
POC Morphine: NOT DETECTED
POC Oxazepam (BZO): NOT DETECTED
POC Oxycodone UR: NOT DETECTED
POC Secobarbital (BAR): NOT DETECTED

## 2022-11-10 LAB — URINALYSIS, ROUTINE W REFLEX MICROSCOPIC
Bacteria, UA: NONE SEEN
Bilirubin Urine: NEGATIVE
Glucose, UA: NEGATIVE mg/dL
Ketones, ur: NEGATIVE mg/dL
Leukocytes,Ua: NEGATIVE
Nitrite: NEGATIVE
Protein, ur: 100 mg/dL — AB
Specific Gravity, Urine: 1.01 (ref 1.005–1.030)
pH: 6 (ref 5.0–8.0)

## 2022-11-10 LAB — TROPONIN I (HIGH SENSITIVITY)
Troponin I (High Sensitivity): 5 ng/L (ref <18)
Troponin I (High Sensitivity): 6 ng/L (ref <18)

## 2022-11-10 LAB — COMPREHENSIVE METABOLIC PANEL
ALT: 83 U/L — ABNORMAL HIGH (ref 0–44)
ALT: 73 U/L — ABNORMAL HIGH (ref 0–44)
AST: 111 U/L — ABNORMAL HIGH (ref 15–41)
AST: 81 U/L — ABNORMAL HIGH (ref 15–41)
Albumin: 4.4 g/dL (ref 3.5–5.0)
Albumin: 4.4 g/dL (ref 3.5–5.0)
Alkaline Phosphatase: 43 U/L (ref 38–126)
Alkaline Phosphatase: 43 U/L (ref 38–126)
Anion gap: 15 (ref 5–15)
Anion gap: 15 (ref 5–15)
BUN: 7 mg/dL (ref 6–20)
BUN: 17 mg/dL (ref 6–20)
CO2: 26 mmol/L (ref 22–32)
CO2: 23 mmol/L (ref 22–32)
Calcium: 9 mg/dL (ref 8.9–10.3)
Calcium: 8.9 mg/dL (ref 8.9–10.3)
Chloride: 91 mmol/L — ABNORMAL LOW (ref 98–111)
Chloride: 92 mmol/L — ABNORMAL LOW (ref 98–111)
Creatinine, Ser: 0.83 mg/dL (ref 0.61–1.24)
Creatinine, Ser: 1.24 mg/dL (ref 0.61–1.24)
GFR, Estimated: >60 mL/min (ref >60)
GFR, Estimated: >60 mL/min (ref >60)
Glucose, Bld: 80 mg/dL (ref 70–99)
Glucose, Bld: 91 mg/dL (ref 70–99)
Potassium: 4.7 mmol/L (ref 3.5–5.1)
Potassium: 4.2 mmol/L (ref 3.5–5.1)
Sodium: 132 mmol/L — ABNORMAL LOW (ref 135–145)
Sodium: 130 mmol/L — ABNORMAL LOW (ref 135–145)
Total Bilirubin: 0.8 mg/dL (ref 0.3–1.2)
Total Bilirubin: 0.8 mg/dL (ref 0.3–1.2)
Total Protein: 9.3 g/dL — ABNORMAL HIGH (ref 6.5–8.1)
Total Protein: 9.4 g/dL — ABNORMAL HIGH (ref 6.5–8.1)

## 2022-11-10 LAB — LIPID PANEL
Cholesterol: 314 mg/dL — ABNORMAL HIGH (ref 0–200)
HDL: >135 mg/dL (ref >40)
Triglycerides: 109 mg/dL (ref <150)
VLDL: 22 mg/dL (ref 0–40)

## 2022-11-10 LAB — RESP PANEL BY RT-PCR (RSV, FLU A&B, COVID)  RVPGX2
Influenza A by PCR: NEGATIVE
Influenza B by PCR: NEGATIVE
Resp Syncytial Virus by PCR: NEGATIVE
SARS Coronavirus 2 by RT PCR: NEGATIVE

## 2022-11-10 LAB — TSH: TSH: 0.798 u[IU]/mL (ref 0.350–4.500)

## 2022-11-10 LAB — MAGNESIUM: Magnesium: 2.2 mg/dL (ref 1.7–2.4)

## 2022-11-10 LAB — POC SARS CORONAVIRUS 2 AG: SARSCOV2ONAVIRUS 2 AG: NEGATIVE

## 2022-11-10 LAB — LIPASE, BLOOD: Lipase: 115 U/L — ABNORMAL HIGH (ref 11–51)

## 2022-11-10 LAB — ETHANOL
Alcohol, Ethyl (B): 347 mg/dL (ref <10)
Alcohol, Ethyl (B): 161 mg/dL — ABNORMAL HIGH (ref <10)

## 2022-11-10 MED ORDER — MAGNESIUM HYDROXIDE 400 MG/5ML PO SUSP: 30.0000 mL | Freq: Every day | ORAL | Status: DC | PRN

## 2022-11-10 MED ORDER — ONDANSETRON HCL 4 MG/2ML IJ SOLN
4.0000 mg | Freq: Once | INTRAMUSCULAR | Status: AC
  Administered 2022-11-10: 4 mg via INTRAVENOUS
  Filled 2022-11-10: qty 2

## 2022-11-10 MED ORDER — GABAPENTIN 100 MG PO CAPS
100.0000 mg | ORAL_CAPSULE | Freq: Three times a day (TID) | ORAL | Status: DC
  Administered 2022-11-10: 100 mg via ORAL
  Filled 2022-11-10: qty 1

## 2022-11-10 MED ORDER — TRAZODONE HCL 50 MG PO TABS
50.0000 mg | ORAL_TABLET | Freq: Every evening | ORAL | Status: DC | PRN
  Administered 2022-11-10: 50 mg via ORAL
  Filled 2022-11-10: qty 1

## 2022-11-10 MED ORDER — LACTATED RINGERS IV BOLUS
2000.0000 mL | Freq: Once | INTRAVENOUS | Status: AC
  Administered 2022-11-10: 2000 mL via INTRAVENOUS

## 2022-11-10 MED ORDER — THIAMINE MONONITRATE 100 MG PO TABS
100.0000 mg | ORAL_TABLET | Freq: Every day | ORAL | Status: DC
  Filled 2022-11-10: qty 1

## 2022-11-10 MED ORDER — ACETAMINOPHEN 325 MG PO TABS: 650.0000 mg | ORAL_TABLET | Freq: Four times a day (QID) | ORAL | Status: DC | PRN

## 2022-11-10 MED ORDER — ALUM & MAG HYDROXIDE-SIMETH 200-200-20 MG/5ML PO SUSP: 30.0000 mL | ORAL | Status: DC | PRN

## 2022-11-10 MED ORDER — LACTATED RINGERS IV SOLN: INTRAVENOUS | Status: DC

## 2022-11-10 MED ORDER — LORAZEPAM 0.5 MG PO TABS
0.5000 mg | ORAL_TABLET | Freq: Once | ORAL | Status: AC
  Administered 2022-11-10: 0.5 mg via ORAL
  Filled 2022-11-10: qty 1

## 2022-11-10 MED ORDER — HYDROXYZINE HCL 25 MG PO TABS
25.0000 mg | ORAL_TABLET | Freq: Three times a day (TID) | ORAL | Status: DC | PRN
  Administered 2022-11-10: 25 mg via ORAL
  Filled 2022-11-10: qty 1

## 2022-11-10 NOTE — ED Notes (Signed)
Assumed care of pt from triage.  He was brought in by EMS from [facility].  Pt is weak and very diaphoretic.  Provider bedside as pt arrived.  RN placed IV and started fluids as ordered.  Pt is alert and oriented at this time.

## 2022-11-10 NOTE — ED Notes (Signed)
Received report from [place] Neighbors from [facility] pt accepted by Dr Zenia Resides to come to ED for evaluation due to ETOH 347. After cleared medically, he should be able to return to [facility].

## 2022-11-10 NOTE — Progress Notes (Signed)
Non emergent EMS notified for their ETA and was told we are 7th in line for transport. [name] [name] is resting quietly on his chair bed without distress.

## 2022-11-10 NOTE — Progress Notes (Signed)
Received [name] [name] in the OBS area after his admission process, he was oriented to his new environment and offered nourishments. He endorsed feeling anxious and a little depressed. He denied feeling suicidal at this time. He was medicated per order and resting in his chair bed.

## 2022-11-10 NOTE — Progress Notes (Addendum)
Report was called to nurse Chong Sicilian at [facility] ED. EMS called,  [name] [name] was informed of the plan of care. This Probation officer waiting for their arrival.
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
## 2022-11-10 NOTE — BH Assessment (Signed)
Comprehensive Clinical Assessment (CCA) Screening, Triage and Referral Note  11/10/2022 [name] [name] [id number]  Chief Complaint: Pt. stated that he is tired and is ready  for detox and to do what he is instructed for follow-up care.  He presents from alcohol use and severe anxiety.  Pt. Said that he has has at least 2 24 oz .beers since yesterday .Pt. denies SI/HI and AVH.  Routine  Visit Diagnosis: Alcohol dependence and anxiety  Patient Reported Information How did you hear about [place]? Self  What Is the Reason for Your Visit/Call Today? Pt. stated that he is tired and is ready  for detox and to do what he is instructed for follow-up care  How Long Has This Been Causing You Problems? > than 6 months  What Do You Feel Would Help You the Most Today? Alcohol or Drug Use Treatment   Have You Recently Had Any Thoughts About Hurting Yourself? No  Are You Planning to Commit Suicide/Harm Yourself At This time? No   Have you Recently Had Thoughts About [place]? No  Are You Planning to Harm Someone at This Time? No  Explanation: No data recorded  Have You Used Any Alcohol or Drugs in the Past 24 Hours? Yes  How Long Ago Did You Use Drugs or Alcohol? No data recorded What Did You Use and How Much? 2 24oz. beers   Do You Currently Have a Therapist/Psychiatrist? No  Name of Therapist/Psychiatrist: No data recorded  Have You Been Recently Discharged From Any Office Practice or Programs? No  Explanation of Discharge From Practice/Program: No data recorded   CCA Screening Triage Referral Assessment Type of Contact: Face-to-Face  Telemedicine Service Delivery:   Is this Initial or Reassessment?   Date Telepsych consult ordered in [facility]:    Time Telepsych consult ordered in [facility]:    Location of Assessment[facility]  Provider Location: [facility] Assessment Services    Collateral Involvement: none   Does Patient Have a [place]? No  data recorded Name and Contact of Legal Guardian: No data recorded If Minor and Not Living with Parent(s), Who has Custody? No data recorded Is CPS involved or ever been involved? Never  Is APS involved or ever been involved? Never   Patient Determined To Be At Risk for Harm To Self or Others Based on Review of Patient Reported Information or Presenting Complaint? No  Method: No data recorded Availability of Means: No data recorded Intent: No data recorded Notification Required: No data recorded Additional Information for Danger to Others Potential: No data recorded Additional Comments for Danger to Others Potential: No data recorded Are There Guns or Other Weapons in Your Home? No data recorded Types of Guns/Weapons: No data recorded Are These Weapons Safely Secured?                            No data recorded Who Could Verify You Are Able To Have These Secured: No data recorded Do You Have any Outstanding Charges, Pending Court Dates, Parole/Probation? No data recorded Contacted To Inform of Risk of Harm To Self or Others: No data recorded  Does Patient Present under Involuntary Commitment? No    [place] of Residence: [place]   Patient Currently Receiving the Following Services: Not Receiving Services   Determination of Need: Routine (7 days)   Options For Referral: Chemical Dependency Intensive Outpatient Therapy (CDIOP); Outpatient Therapy; Partial Hospitalization   Discharge  Disposition:     Anette Riedel

## 2022-11-10 NOTE — ED Provider Notes (Signed)
[facility] DEPT Provider Note   CSN: [id number] Arrival date & time: 11/10/22  2105     History  Chief Complaint  Patient presents with   Alcohol Intoxication    [name] [name] is a 61 y.o. male.  61 year old male presents for mental health facility due to needing medical clearance.  Patient went there for help with alcohol abuse.  States he drinks 3 40 ounces of beer per day.  Denies any illicit drug use.  States has had some nonbilious emesis.  Denies any blood in his stool.  States he has sharp chest discomfort which he relates to his history of sarcoid.  He denies any exertional component to it.  It is not pleuritic.  No associated dyspnea.  I spoke with the behavioral health provider who stated that because with alcohol level there was 349, patient would need to go to the ER for medical clearance.  They stated that once his alcohol level is below 300, he can return there.       Home Medications Prior to Admission medications   Medication Sig Start Date End Date Taking? Authorizing Provider  escitalopram (LEXAPRO) 20 MG tablet Take 1 tablet (20 mg total) by mouth once daily. 04/13/22   Argentina Donovan, PA-C  gabapentin (NEURONTIN) 100 MG capsule Take 2 capsules (200 mg total) by mouth 3 (three) times daily. 04/13/22   Argentina Donovan, PA-C      Allergies    Oxycodone    Review of Systems   Review of Systems  All other systems reviewed and are negative.   Physical Exam Updated Vital Signs BP (!) 67/48 (BP Location: Left Arm)   Pulse 82   Temp 98.9 F (37.2 C) (Oral)   Resp 18   Ht 1.753 m ('5\' 9"'$ )   Wt 68 kg   SpO2 97%   BMI 22.15 kg/m  Physical Exam Vitals and nursing note reviewed.  Constitutional:      General: He is not in acute distress.    Appearance: Normal appearance. He is well-developed. He is not toxic-appearing.  HENT:     Head: Normocephalic and atraumatic.  Eyes:     General: Lids are normal.      Conjunctiva/sclera: Conjunctivae normal.     Pupils: Pupils are equal, round, and reactive to light.  Neck:     Thyroid: No thyroid mass.     Trachea: No tracheal deviation.  Cardiovascular:     Rate and Rhythm: Normal rate and regular rhythm.     Heart sounds: Normal heart sounds. No murmur heard.    No gallop.  Pulmonary:     Effort: Pulmonary effort is normal. No respiratory distress.     Breath sounds: Normal breath sounds. No stridor. No decreased breath sounds, wheezing, rhonchi or rales.  Abdominal:     General: There is no distension.     Palpations: Abdomen is soft.     Tenderness: There is no abdominal tenderness. There is no rebound.  Musculoskeletal:        General: No tenderness. Normal range of motion.     Cervical back: Normal range of motion and neck supple.  Skin:    General: Skin is warm and dry.     Findings: No abrasion or rash.  Neurological:     Mental Status: He is alert and oriented to person, place, and time. Mental status is at baseline.     GCS: GCS eye subscore is 4. GCS verbal subscore  is 5. GCS motor subscore is 6.     Cranial Nerves: No cranial nerve deficit.     Sensory: No sensory deficit.     Motor: Motor function is intact.  Psychiatric:        Attention and Perception: Attention normal.        Speech: Speech normal.        Behavior: Behavior normal.     ED Results / Procedures / Treatments   Labs (all labs ordered are listed, but only abnormal results are displayed) Labs Reviewed  CBC WITH DIFFERENTIAL/PLATELET  COMPREHENSIVE METABOLIC PANEL  ETHANOL  RAPID URINE DRUG SCREEN, HOSP PERFORMED  LIPASE, BLOOD  TROPONIN I (HIGH SENSITIVITY)    EKG EKG Interpretation  Date/Time:  Friday November 10 2022 21:35:33 EST Ventricular Rate:  87 PR Interval:  143 QRS Duration: 93 QT Interval:  354 QTC Calculation: 426 R Axis:   76 Text Interpretation: Sinus rhythm Consider left ventricular hypertrophy  Early repolarization unchanged from  5/23  Confirmed by Lacretia Leigh ([id number]) on 11/10/2022 9:41:13 PM  Radiology No results found.  Procedures Procedures    Medications Ordered in ED Medications  lactated ringers bolus 2,000 mL (has no administration in time range)  lactated ringers infusion (has no administration in time range)  ondansetron (ZOFRAN) injection 4 mg (has no administration in time range)    ED Course/ Medical Decision Making/ A&P                           Medical Decision Making Amount and/or Complexity of Data Reviewed Labs: ordered. Radiology: ordered.  Risk Prescription drug management.   Patient is EKG per interpretation shows early repull.  No signs of acute coronary ischemia.  Chest x-ray per my interpretation show no acute findings.  Labs are pending at this time.  Signed out to Dr. Tyrone Nine        Final Clinical Impression(s) / ED Diagnoses Final diagnoses:  None    Rx / DC Orders ED Discharge Orders     None         Lacretia Leigh, MD 11/10/22 2321
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
## 2022-11-10 NOTE — ED Triage Notes (Signed)
Pt BIB EMS from [facility] with reports of ETOH. Last drink this morning.

## 2022-11-10 NOTE — Progress Notes (Signed)
   11/10/22 1143  [place] (Walk-ins at [facility] only)  How Did You Hear About [place]? Self  What Is the Reason for Your Visit/Call Today? Pt. stated that he is tired and is ready  for detox and to do what he is instructed for follow-up care  How Long Has This Been Causing You Problems? > than 6 months  Have You Recently Had Any Thoughts About Hurting Yourself? No  Are You Planning to Commit Suicide/Harm Yourself At This time? No  Have you Recently Had Thoughts About [place]? No  Are You Planning To Harm Someone At This Time? No  Are you currently experiencing any auditory, visual or other hallucinations? No  Have You Used Any Alcohol or Drugs in the Past 24 Hours? Yes  How long ago did you use Drugs or Alcohol? yesterday  What Did You Use and How Much? 2 24oz. beers  Do you have any current medical co-morbidities that require immediate attention? No (Pt. reports anxiety and sarcoidosis)  Clinician description of patient physical appearance/behavior: well-groomed, good psoture, mde good eye contact, anxious  What Do You Feel Would Help You the Most Today? Alcohol or Drug Use Treatment  If access to [facility] was not available, would you have sought care in the Emergency Department? No  Determination of Need Routine (7 days)  Options For Referral Chemical Dependency Intensive Outpatient Therapy (CDIOP);Outpatient Therapy;Partial Hospitalization

## 2022-11-10 NOTE — ED Provider Notes (Addendum)
Patient to be admitted to [name]. Received Ethanol level with BAL 347. As well, he has medical history of sarcoidosis and made complaints of chest pain. Order for CXR entered by this Probation officer. At this time, believe it best for patient to be transferred to [name] [name] and will be able to return to [name] once medically cleared and BAL <300 (FBC exclusionary criteria for BAL >300). Discussed patient with EDP, Dr. Zenia Resides, who was in agreement to receive patient and to return to our care once stable.    Rosezetta Schlatter, MD PGY-2 11/10/2022  5:29 PM [place] Department of Psychiatry

## 2022-11-10 NOTE — ED Notes (Signed)
EMS at bedside

## 2022-11-10 NOTE — Discharge Instructions (Signed)
Follow-up recommendations:  Activity:  Normal, as tolerated Diet:  Per PCP recommendation  Patient is instructed prior to discharge to: Take all medications as prescribed by his mental healthcare provider. Report any adverse effects and/or reactions from the medicines to his outpatient provider promptly. Patient has been instructed & cautioned: To not engage in alcohol and or illegal drug use while on prescription medicines.  In the event of worsening symptoms, patient is instructed to call the crisis hotline at 988, 911 and or go to the nearest ED for appropriate evaluation and treatment of symptoms. To follow-up with his primary care provider for your other medical issues, concerns and or health care needs.  

## 2022-11-10 NOTE — ED Notes (Signed)
Called lab to verify they received labs, they have and will run them.

## 2022-11-10 NOTE — Progress Notes (Signed)
Received a call from the lab related to alcohol level 374, Patrice was notified via [place], MHT. Confirmed delivery of the message.

## 2022-11-11 ENCOUNTER — Other Ambulatory Visit (HOSPITAL_COMMUNITY)
Admission: EM | Admit: 2022-11-11 | Discharge: 2022-11-13 | Payer: No Payment, Other | Attending: Psychiatry | Admitting: Psychiatry

## 2022-11-11 DIAGNOSIS — F411 Generalized anxiety disorder: Secondary | ICD-10-CM | POA: Insufficient documentation

## 2022-11-11 DIAGNOSIS — F1020 Alcohol dependence, uncomplicated: Secondary | ICD-10-CM | POA: Diagnosis present

## 2022-11-11 DIAGNOSIS — G629 Polyneuropathy, unspecified: Secondary | ICD-10-CM | POA: Insufficient documentation

## 2022-11-11 DIAGNOSIS — M25462 Effusion, left knee: Secondary | ICD-10-CM | POA: Insufficient documentation

## 2022-11-11 DIAGNOSIS — Z79899 Other long term (current) drug therapy: Secondary | ICD-10-CM | POA: Diagnosis not present

## 2022-11-11 LAB — RAPID URINE DRUG SCREEN, HOSP PERFORMED
Amphetamines: NOT DETECTED
Barbiturates: NOT DETECTED
Benzodiazepines: NOT DETECTED
Cocaine: NOT DETECTED
Opiates: NOT DETECTED
Tetrahydrocannabinol: NOT DETECTED

## 2022-11-11 LAB — TROPONIN I (HIGH SENSITIVITY): Troponin I (High Sensitivity): 4 ng/L (ref <18)

## 2022-11-11 MED ORDER — CHLORDIAZEPOXIDE HCL 25 MG PO CAPS: 25.0000 mg | ORAL_CAPSULE | Freq: Two times a day (BID) | ORAL | Status: DC

## 2022-11-11 MED ORDER — LORAZEPAM 2 MG/ML IJ SOLN
2.0000 mg | Freq: Once | INTRAMUSCULAR | Status: AC
  Administered 2022-11-11: 2 mg via INTRAVENOUS
  Filled 2022-11-11: qty 1

## 2022-11-11 MED ORDER — CHLORDIAZEPOXIDE HCL 25 MG PO CAPS
100.0000 mg | ORAL_CAPSULE | Freq: Once | ORAL | Status: AC
  Administered 2022-11-11: 100 mg via ORAL
  Filled 2022-11-11: qty 4

## 2022-11-11 MED ORDER — HYDROXYZINE HCL 25 MG PO TABS
25.0000 mg | ORAL_TABLET | Freq: Four times a day (QID) | ORAL | Status: DC | PRN
  Administered 2022-11-12: 25 mg via ORAL
  Filled 2022-11-11: qty 1

## 2022-11-11 MED ORDER — GABAPENTIN 100 MG PO CAPS
200.0000 mg | ORAL_CAPSULE | Freq: Three times a day (TID) | ORAL | Status: DC
  Administered 2022-11-11 – 2022-11-13 (×6): 200 mg via ORAL
  Filled 2022-11-11 (×6): qty 2

## 2022-11-11 MED ORDER — ONDANSETRON 4 MG PO TBDP: 4.0000 mg | ORAL_TABLET | Freq: Four times a day (QID) | ORAL | Status: DC | PRN

## 2022-11-11 MED ORDER — LORAZEPAM 2 MG/ML IJ SOLN: 2.0000 mg | Freq: Four times a day (QID) | INTRAMUSCULAR | Status: DC | PRN

## 2022-11-11 MED ORDER — THIAMINE HCL 100 MG/ML IJ SOLN
100.0000 mg | Freq: Once | INTRAMUSCULAR | Status: AC
  Administered 2022-11-11: 100 mg via INTRAMUSCULAR
  Filled 2022-11-11: qty 2

## 2022-11-11 MED ORDER — MAGNESIUM HYDROXIDE 400 MG/5ML PO SUSP: 30.0000 mL | Freq: Every day | ORAL | Status: DC | PRN

## 2022-11-11 MED ORDER — SODIUM CHLORIDE 0.9 % IV BOLUS
1000.0000 mL | Freq: Once | INTRAVENOUS | Status: AC
  Administered 2022-11-11: 1000 mL via INTRAVENOUS

## 2022-11-11 MED ORDER — TRAZODONE HCL 50 MG PO TABS
50.0000 mg | ORAL_TABLET | Freq: Every evening | ORAL | Status: DC | PRN
  Administered 2022-11-11: 50 mg via ORAL
  Filled 2022-11-11: qty 1

## 2022-11-11 MED ORDER — ADULT MULTIVITAMIN W/MINERALS CH
1.0000 | ORAL_TABLET | Freq: Every day | ORAL | Status: DC
  Administered 2022-11-11 – 2022-11-13 (×3): 1 via ORAL
  Filled 2022-11-11 (×3): qty 1

## 2022-11-11 MED ORDER — CHLORDIAZEPOXIDE HCL 25 MG PO CAPS: 25.0000 mg | ORAL_CAPSULE | Freq: Once | ORAL | Status: DC

## 2022-11-11 MED ORDER — CHLORDIAZEPOXIDE HCL 25 MG PO CAPS
50.0000 mg | ORAL_CAPSULE | Freq: Four times a day (QID) | ORAL | Status: AC
  Administered 2022-11-11 – 2022-11-12 (×4): 50 mg via ORAL
  Filled 2022-11-11 (×4): qty 2

## 2022-11-11 MED ORDER — CHLORDIAZEPOXIDE HCL 25 MG PO CAPS: 25.0000 mg | ORAL_CAPSULE | Freq: Three times a day (TID) | ORAL | Status: DC

## 2022-11-11 MED ORDER — ALUM & MAG HYDROXIDE-SIMETH 200-200-20 MG/5ML PO SUSP: 30.0000 mL | ORAL | Status: DC | PRN

## 2022-11-11 MED ORDER — ESCITALOPRAM OXALATE 10 MG PO TABS
10.0000 mg | ORAL_TABLET | Freq: Every day | ORAL | Status: DC
  Administered 2022-11-11 – 2022-11-13 (×3): 10 mg via ORAL
  Filled 2022-11-11 (×3): qty 1

## 2022-11-11 MED ORDER — CHLORDIAZEPOXIDE HCL 25 MG PO CAPS
25.0000 mg | ORAL_CAPSULE | Freq: Four times a day (QID) | ORAL | Status: AC
  Administered 2022-11-12 – 2022-11-13 (×4): 25 mg via ORAL
  Filled 2022-11-11 (×4): qty 1

## 2022-11-11 MED ORDER — LOPERAMIDE HCL 2 MG PO CAPS: 2.0000 mg | ORAL_CAPSULE | ORAL | Status: DC | PRN

## 2022-11-11 MED ORDER — LORAZEPAM 1 MG PO TABS: 1.0000 mg | ORAL_TABLET | Freq: Four times a day (QID) | ORAL | Status: DC | PRN

## 2022-11-11 MED ORDER — ACETAMINOPHEN 325 MG PO TABS
650.0000 mg | ORAL_TABLET | Freq: Four times a day (QID) | ORAL | Status: DC | PRN
  Administered 2022-11-12 – 2022-11-13 (×2): 650 mg via ORAL
  Filled 2022-11-11 (×2): qty 2

## 2022-11-11 NOTE — ED Notes (Signed)
Pt arrived [facility]

## 2022-11-11 NOTE — ED Notes (Signed)
Pt resting quietly, breathing is even and unlabored.  Pt denies SI, HI, pain and AVH.  Will continue to monitor for safety.  

## 2022-11-11 NOTE — ED Provider Notes (Incomplete)
Received patient in turnover from Dr. Zenia Resides.  Please see their note for further details of Hx, PE.  Briefly patient is a 61 y.o. male with a Alcohol Intoxication .  Patient's alcohol level was too high for behavioral health per their protocol.  Plan to obtain lab work here and likely dispo back to [facility] if labs have improved.  Alcohol level less than 200.  Troponin negative.  LFTs and lipase are mildly elevated.  I discussed results with the patient.  He is feeling a bit better.  Could have mild pancreatitis but tolerating oral without issue here.  Feel medically cleared for behavioral health evaluation for alcohol cessation.    I was notified that the patient had become tachycardic, on my exam most consistent with alcohol withdrawal.  Will give Ativan and Librium.  Bolus of IV fluids.  Patient doing better after Ativan and Librium.  Heart rate has decreased.  Blood pressure is also trended downward.  Still awaiting transfer to be [place].  Will put in a consult to TTS.    Deno Etienne, DO 11/11/22 [phone]

## 2022-11-11 NOTE — ED Notes (Signed)
Snacks given 

## 2022-11-11 NOTE — ED Notes (Signed)
Pt eating and drinking at this time. 

## 2022-11-11 NOTE — ED Provider Notes (Signed)
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 Results  Component Value Date   ETH 161 (H) 11/10/2022   ETH 347 (HH) 18/84/1660    Metabolic Disorder Labs: Lab Results  Component Value Date   HGBA1C 5.6 03/27/2022   MPG 114.02 03/27/2022   MPG 114.02 12/27/2021   No results found for: "PROLACTIN" Lab Results  Component Value Date   CHOL 314 (H) 11/10/2022   TRIG 109 11/10/2022   HDL >135 11/10/2022   CHOLHDL NOT CALCULATED 11/10/2022   VLDL 22 11/10/2022   LDLCALC NOT CALCULATED 11/10/2022   LDLCALC 142 (H) 04/13/2022     Therapeutic Lab Levels: No results found for: "LITHIUM" No results found for: "VALPROATE" No results found for: "CBMZ"  Physical Findings   AIMS    Flowsheet Row Admission (Discharged) from 03/16/2021 in [place] [facility]B Admission (Discharged) from 05/04/2018 in [place] [facility] Admission (Discharged) from 06/29/2017 in [place] 300B  AIMS Total Score 0 0 0      AUDIT    Flowsheet Row Admission (Discharged) from 03/16/2021 in [place] [facility]B Admission (Discharged) from 05/04/2018 in [place] 300B Admission (Discharged) from 06/29/2017 in [place] 300B Admission (Discharged) from 09/06/2014 in [place] Admission (Discharged) from 06/01/2014 in [place] 300B  Alcohol Use Disorder Identification Test Final Score (AUDIT) 40 34 35 26 [place] Office Visit from 05/31/2022 in [place] 1 Office Visit from 04/13/2022 in [place] Office Visit from 12/02/2021 in [place] Office Visit from 10/10/2021 in [name] Office Visit from 08/18/2021 in [place] 1  Total GAD-7 Score 7 0 '13 13 11      '$ PHQ2-9    Flowsheet Row ED from 11/10/2022 in [facility] Visit from 05/31/2022 in [place] 1 Office Visit from 04/13/2022 in [place] ED from 03/27/2022 in [facility] Office Visit from 12/02/2021 in [place]  PHQ-2 Total Score '6 1 1 1 5  '$ PHQ-9 Total Score 20 5 -- 7 [place] ED from 11/11/2022 in [facility] Most recent reading at 11/11/2022 12:04 PM ED from  11/10/2022 in [place] DEPT Most recent reading at 11/10/2022 10:10 PM ED from 11/10/2022 in [facility] Most recent reading at 11/10/2022  2:33 PM  C-SSRS RISK CATEGORY No Risk No Risk No Risk        Musculoskeletal  Strength & Muscle Tone: decreased Gait & Station: shuffle Patient leans: Front  Psychiatric Specialty Exam  Presentation  General Appearance:  Appropriate for Environment; Casual  Eye Contact: Good  Speech: Clear and Coherent (stutter at baseline)  Speech Volume: Normal  Handedness: Right   Mood and Affect  Mood: Anxious  Affect: Appropriate   Thought Process  Thought Processes: Goal Directed  Descriptions of Associations:Intact  Orientation:Full (Time, Place and Person)  Thought Content:Logical  Diagnosis of Schizophrenia or Schizoaffective disorder in past: No    Hallucinations:Hallucinations: None  Ideas of Reference:None  Suicidal Thoughts:Suicidal Thoughts: No  Homicidal Thoughts:Homicidal Thoughts: No   Sensorium  Memory: Immediate Good; Recent Fair  Judgment: Fair  Insight: Good   Executive Functions  Concentration: Fair  Attention Span: Fair  Recall: Poor (0/3)  Fund of Knowledge: Poor  Language: Poor (some word finding difficulty)   Psychomotor Activity  Psychomotor Activity: Psychomotor Activity: Tremor   Assets  Assets: Armed forces logistics/support/administrative officer; Desire for Improvement; Resilience; Housing; Social Support; Vocational/Educational   Sleep  Sleep: Sleep: Good  No data recorded  Physical Exam  Physical Exam HENT:     Head: Normocephalic and atraumatic.  Pulmonary:     Effort: Pulmonary effort is normal.  Skin:    General: Skin is warm and dry.  Neurological:     Mental Status: He is alert and oriented to person, place, and time.    Review of Systems  Constitutional:  Positive for malaise/fatigue.  Neurological:  Positive  for tingling and weakness.  Psychiatric/Behavioral:  Positive for memory loss and substance abuse. Negative for hallucinations. The patient is nervous/anxious. The patient does not have insomnia.    Blood pressure (!) 130/96, pulse 98, temperature 98.4 F (36.9 C), temperature source Oral, resp. rate 18, SpO2 98 %. There is no height or weight on file to calculate BMI.  Treatment Plan Summary: Daily contact with patient to assess and evaluate symptoms and progress in treatment and Medication management   Patient due to continued severe alcohol use disorder.  The patient was seen in such elevated level of Librium prior to transfer, we will have to read a prolonged taper.  Will also reassess patient's sodium and lipase.  Patient has multiple comorbidities all likely secondary to chronic alcoholism.  Will restart patient's Lexapro to address his chronic generalized anxiety disorder.  Patient is interested in starting naltrexone in the future.  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 - Follow-up CMP -Patient received fluids in the ED  Acute pancreatitis - Continue to monitor - Follow-up lipase  GAD - Start Lexapro 10 mg daily - Continue trazodone 50 mg nightly as needed  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

## 2022-11-12 DIAGNOSIS — F411 Generalized anxiety disorder: Secondary | ICD-10-CM | POA: Diagnosis not present

## 2022-11-12 DIAGNOSIS — G629 Polyneuropathy, unspecified: Secondary | ICD-10-CM | POA: Diagnosis not present

## 2022-11-12 DIAGNOSIS — Z79899 Other long term (current) drug therapy: Secondary | ICD-10-CM | POA: Diagnosis not present

## 2022-11-12 DIAGNOSIS — F1020 Alcohol dependence, uncomplicated: Secondary | ICD-10-CM | POA: Diagnosis not present

## 2022-11-12 LAB — COMPREHENSIVE METABOLIC PANEL
ALT: 48 U/L — ABNORMAL HIGH (ref 0–44)
AST: 52 U/L — ABNORMAL HIGH (ref 15–41)
Albumin: 3.8 g/dL (ref 3.5–5.0)
Alkaline Phosphatase: 37 U/L — ABNORMAL LOW (ref 38–126)
Anion gap: 7 (ref 5–15)
BUN: 8 mg/dL (ref 6–20)
CO2: 30 mmol/L (ref 22–32)
Calcium: 9.2 mg/dL (ref 8.9–10.3)
Chloride: 97 mmol/L — ABNORMAL LOW (ref 98–111)
Creatinine, Ser: 1.32 mg/dL — ABNORMAL HIGH (ref 0.61–1.24)
GFR, Estimated: >60 mL/min (ref >60)
Glucose, Bld: 84 mg/dL (ref 70–99)
Potassium: 4.5 mmol/L (ref 3.5–5.1)
Sodium: 134 mmol/L — ABNORMAL LOW (ref 135–145)
Total Bilirubin: 0.9 mg/dL (ref 0.3–1.2)
Total Protein: 8 g/dL (ref 6.5–8.1)

## 2022-11-12 NOTE — ED Notes (Signed)
Lunch was given

## 2022-11-12 NOTE — ED Notes (Signed)
Pt attended group,they watched a move

## 2022-11-12 NOTE — ED Notes (Signed)
Breakfast was given

## 2022-11-12 NOTE — ED Provider Notes (Signed)
Behavioral Health Progress Note  Date and Time: 11/12/2022 1:04 PM Name: [name] [name] MRN:  [id number]  Subjective:   [name] [name] is a 61 yo male w/ hx of MDD, anxiety, Alcohol Use Disorder, homelessness, sarcoidosis presenting to [facility] for assistance for alcohol detox and residential rehab.      Patient reports that he "depressed and nervous" today endorsing that this is because he is thinking about the fact that he is in detox for 20+ time and that his own family members have indicated they have little faith that he will able to be sober. Patient reports that his anxiety is a 7/10 with 10 being severe, and endorses that he does feel on edge. Patient reports that he is also having some VH where he see's knats and will try to swat at them. Patient reports that these only started when he came to [facility]. Patient reports that he does not have SI, HI, AH nor tactile hallucinations. Patient reports that he has some cravings for Etoh  but he still does not have the urge to act on them.   Patient reports that he continues to have a "cloudy" head, but his tremor is improving. Patient reports he is also still having mild epigastric pain, but denies nausea, vomiting, diarrhea, constipation, or chills.   Patient reports that he is trying to eat because he knows he needs the nutrition. He also reports that he slept well last night.   Diagnosis:  Final diagnoses:  Alcohol use disorder, severe, dependence (HCC)  GAD (generalized anxiety disorder)    Total Time spent with patient: 20 minutes  Past Psychiatric History: Inpatient: Denies Outpatient: Positive Patient currently being seen at [facility] and prescribed Lexapro, gabapentin, trazodone.  Patient reports he has had benefit with all previous medications.  Patient reports that he has previously used but did not find it beneficial.   Patient reports he was in "special ed" while growing up Past Medical History:  Past Medical History:   Diagnosis Date   Alcohol abuse    Anxiety and depression    Bilateral primary osteoarthritis of knee    Carpal tunnel syndrome of right wrist Dx 2015   Cervical stenosis of spine    MRI 10/2021   Essential hypertension    Gallbladder sludge    GERD (gastroesophageal reflux disease) Dx 2007   Hepatic steatosis    History of alcoholic hepatitis    History of CVA (cerebrovascular accident)    05/2021 MRI brain: Mild chronic microvascular ischemic change in the white matter. Small chronic infarct left superior cerebellum.  Also, chronic microhemorrhage, ? 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   Seborrheic dermatitis     Past Surgical History:  Procedure Laterality Date   carpel tunnel release Right 07/28/2014    done in [place] Left    15 years ago   LIPOMA EXCISION N/A 05/10/2016   Procedure: EXCISION OF SCALP LIPOMA;  Surgeon: Johnathan Hausen, MD;  Location: [facility];  Service: General;  Laterality: N/A;   Family History:  Family History  Problem Relation Age of Onset   Diabetes Father    Hypertension Father    Hypertension Paternal Grandfather    Alcohol abuse Paternal Grandfather    Alcohol abuse Maternal Grandfather    Alcohol abuse Maternal Grandmother    Alcohol abuse Paternal Grandmother    Colon cancer Neg Hx    Rectal cancer Neg Hx    Stomach cancer Neg Hx    Bipolar disorder Neg Hx    Depression Neg Hx    Family Psychiatric  History: Patient endorses multiple family members including both of his grandfathers, aunts, and uncles having EtOH use disorder   Social History:  Social History   Substance and Sexual Activity   Alcohol Use Yes   Comment: clean for 4 months     Social History   Substance and Sexual Activity  Drug Use Not Currently   Types: Marijuana   Comment: Patient denies     Social History   Socioeconomic History   Marital status: Legally Separated    Spouse name: Not on file   Number of children: 2   Years of education: 13 yrs   Highest education level: Not on file  Occupational History   Occupation: custodian    Comment: [employer]  Tobacco Use   Smoking status: Never   Smokeless tobacco: Never  Vaping Use   Vaping Use: Never used  Substance and Sexual Activity   Alcohol use: Yes    Comment: clean for 4 months   Drug use: Not Currently    Types: Marijuana    Comment: Patient denies    Sexual activity: Not Currently  Other Topics Concern   Not on file  Social History Narrative   Born and raised in [place], [place] by both parents. He has 2 sisters and is the middle child. He remains close with family. He was married for 16 yrs and seperated in 2005. He has 2 boys who are in their 20's now. He lives alone in [place]. He is working as a Charity fundraiser handed    Lives in a one story home    Social Determinants of Health   Financial Resource Strain: Not on file  Food Insecurity: Not on file  Transportation Needs: Not on file  Physical Activity: Not on file  Stress: Not on file  Social Connections: Not on file   SDOH:  SDOH Screenings   Alcohol Screen: Medium Risk (03/16/2021)  Depression (PHQ2-9): High Risk (11/11/2022)  Tobacco Use: Low Risk  (11/10/2022)   Additional Social History:                         Sleep: Good  Appetite:  Fair  Current Medications:  Current Facility-Administered Medications  Medication Dose Route Frequency Provider Last Rate Last Admin   acetaminophen (TYLENOL) tablet 650 mg  650 mg Oral Q6H PRN Freida Busman, MD       alum & mag hydroxide-simeth (MAALOX/MYLANTA) 200-200-20 MG/5ML suspension 30 mL  30 mL Oral  Q4H PRN Damita Dunnings B, MD       chlordiazePOXIDE (LIBRIUM) capsule 25 mg  25 mg Oral QID Damita Dunnings B, MD       Followed by   Derrill Memo ON 11/13/2022] chlordiazePOXIDE (LIBRIUM) capsule 25 mg  25 mg Oral TID Damita Dunnings B, MD       Followed by   Derrill Memo ON 11/14/2022] chlordiazePOXIDE (LIBRIUM) capsule 25  mg  25 mg Oral BID Freida Busman, MD       Followed by   Derrill Memo ON 11/15/2022] chlordiazePOXIDE (LIBRIUM) capsule 25 mg  25 mg Oral Once Damita Dunnings B, MD       escitalopram (LEXAPRO) tablet 10 mg  10 mg Oral Daily Damita Dunnings B, MD   10 mg at 11/12/22 0930   gabapentin (NEURONTIN) capsule 200 mg  200 mg Oral TID Damita Dunnings B, MD   200 mg at 11/12/22 0930   hydrOXYzine (ATARAX) tablet 25 mg  25 mg Oral Q6H PRN Freida Busman, MD       loperamide (IMODIUM) capsule 2-4 mg  2-4 mg Oral PRN Freida Busman, MD       LORazepam (ATIVAN) tablet 1 mg  1 mg Oral Q6H PRN Freida Busman, MD       Or   LORazepam (ATIVAN) injection 2 mg  2 mg Intramuscular Q6H PRN Damita Dunnings B, MD       magnesium hydroxide (MILK OF MAGNESIA) suspension 30 mL  30 mL Oral Daily PRN Damita Dunnings B, MD       multivitamin with minerals tablet 1 tablet  1 tablet Oral Daily Damita Dunnings B, MD   1 tablet at 11/12/22 0930   ondansetron (ZOFRAN-ODT) disintegrating tablet 4 mg  4 mg Oral Q6H PRN Freida Busman, MD       traZODone (DESYREL) tablet 50 mg  50 mg Oral QHS PRN Damita Dunnings B, MD   50 mg at 11/11/22 2107   Current Outpatient Medications  Medication Sig Dispense Refill   escitalopram (LEXAPRO) 20 MG tablet Take 1 tablet (20 mg total) by mouth once daily. 30 tablet 3   gabapentin (NEURONTIN) 100 MG capsule Take 2 capsules (200 mg total) by mouth 3 (three) times daily. 180 capsule 3    Labs  Lab Results:  Admission on 11/10/2022, Discharged on 11/11/2022  Component Date Value Ref Range Status   WBC 11/10/2022 10.1  4.0 - 10.5 K/uL Final   RBC 11/10/2022 5.11  4.22 - 5.81 MIL/uL Final    Hemoglobin 11/10/2022 13.3  13.0 - 17.0 g/dL Final   HCT 11/10/2022 40.7  39.0 - 52.0 % Final   MCV 11/10/2022 79.6 (L)  80.0 - 100.0 fL Final   MCH 11/10/2022 26.0  26.0 - 34.0 pg Final   MCHC 11/10/2022 32.7  30.0 - 36.0 g/dL Final   RDW 11/10/2022 17.1 (H)  11.5 - 15.5 % Final   Platelets 11/10/2022 195  150 - 400 K/uL Final   nRBC 11/10/2022 0.0  0.0 - 0.2 % Final   Neutrophils Relative % 11/10/2022 45  % Final   Neutro Abs 11/10/2022 4.5  1.7 - 7.7 K/uL Final   Lymphocytes Relative 11/10/2022 41  % Final   Lymphs Abs 11/10/2022 4.2 (H)  0.7 - 4.0 K/uL Final   Monocytes Relative 11/10/2022 12  % Final   Monocytes Absolute 11/10/2022 1.2 (H)  0.1 - 1.0 K/uL Final   Eosinophils Relative 11/10/2022 1  % Final   Eosinophils Absolute 11/10/2022 0.1  0.0 - 0.5 K/uL Final   Basophils Relative 11/10/2022 1  % Final   Basophils Absolute 11/10/2022 0.1  0.0 - 0.1 K/uL Final   Immature Granulocytes 11/10/2022 0  % Final   Abs Immature Granulocytes 11/10/2022 0.03  0.00 - 0.07 K/uL Final   Performed at [facility], [place] [address] [place], [place] [address]  Troponin I (High Sensitivity) 11/10/2022 6  <18 ng/L Final   Comment: (NOTE) Elevated high sensitivity troponin I (hsTnI) values and significant  changes across serial measurements may suggest ACS but many other  chronic and acute conditions are known to elevate hsTnI results.  Refer to the "Links" section for chest pain algorithms and additional  guidance. Performed at [facility], [place] [address], [place], [place] [address]    Sodium 11/10/2022 130 (L)  135 - 145 mmol/L Final   Potassium 11/10/2022 4.2  3.5 - 5.1 mmol/L Final   Chloride 11/10/2022 92 (L)  98 - 111 mmol/L Final   CO2 11/10/2022 23  22 - 32 mmol/L Final   Glucose, Bld 11/10/2022 91  70 - 99 mg/dL Final   Glucose reference range applies only to samples taken after fasting for at least 8 hours.   BUN 11/10/2022 17  6 - 20 mg/dL  Final   Creatinine, Ser 11/10/2022 1.24  0.61 - 1.24 mg/dL Final   Calcium 11/10/2022 8.9  8.9 - 10.3 mg/dL Final   Total Protein 11/10/2022 9.4 (H)  6.5 - 8.1 g/dL Final   Albumin 11/10/2022 4.4  3.5 - 5.0 g/dL Final   AST 11/10/2022 81 (H)  15 - 41 U/L Final   ALT 11/10/2022 73 (H)  0 - 44 U/L Final   Alkaline Phosphatase 11/10/2022 43  38 - 126 U/L Final   Total Bilirubin 11/10/2022 0.8  0.3 - 1.2 mg/dL Final   GFR, Estimated 11/10/2022 >60  >60 mL/min Final   Comment: (NOTE) Calculated using the CKD-EPI Creatinine Equation (2021)    Anion gap 11/10/2022 15  5 - 15 Final   Performed at [facility], [place] [address] [place], [place] [address]   Alcohol, Ethyl (B) 11/10/2022 161 (H)  <10 mg/dL Final   Comment: (NOTE) Lowest detectable limit for serum alcohol is 10 mg/dL.  For medical purposes only. Performed at [facility], [place] [address] [place],  [address]    Opiates 11/11/2022 NONE DETECTED  NONE DETECTED Final   Cocaine 11/11/2022 NONE DETECTED  NONE DETECTED Final   Benzodiazepines 11/11/2022 NONE DETECTED  NONE DETECTED Final   Amphetamines 11/11/2022 NONE DETECTED  NONE DETECTED Final   Tetrahydrocannabinol 11/11/2022 NONE DETECTED  NONE DETECTED Final   Barbiturates 11/11/2022 NONE DETECTED  NONE DETECTED Final   Comment: (NOTE) DRUG SCREEN FOR MEDICAL PURPOSES ONLY.  IF CONFIRMATION IS NEEDED FOR ANY PURPOSE, NOTIFY LAB WITHIN 5 DAYS.  LOWEST DETECTABLE LIMITS FOR URINE DRUG SCREEN Drug Class                     Cutoff (ng/mL) Amphetamine and metabolites    1000 Barbiturate and metabolites    200 Benzodiazepine                 200 Opiates and metabolites        300 Cocaine and metabolites        300 THC                            50 Performed at [facility], [place] [address] [place], [place] [address]    Lipase 11/10/2022 115 (H)  11 - 51 U/L Final   Performed at [facility],  [place] [address] [place], [place] [address]   Troponin I (High Sensitivity) 11/11/2022 4  <18 ng/L Final  Comment: (NOTE) Elevated high sensitivity troponin I (hsTnI) values and significant  changes across serial measurements may suggest ACS but many other  chronic and acute conditions are known to elevate hsTnI results.  Refer to the "Links" section for chest pain algorithms and additional  guidance. Performed at [facility], [place] [address] [place], [place] [address]   Admission on 11/10/2022, Discharged on 11/10/2022  Component Date Value Ref Range Status   SARS Coronavirus 2 by RT PCR 11/10/2022 NEGATIVE  NEGATIVE Final   Comment: (NOTE) SARS-CoV-2 target nucleic acids are NOT DETECTED.  The SARS-CoV-2 RNA is generally detectable in upper respiratory specimens during the acute phase of infection. The lowest concentration of SARS-CoV-2 viral copies this assay can detect is 138 copies/mL. A negative result does not preclude SARS-Cov-2 infection and should not be used as the sole basis for treatment or other patient management decisions. A negative result may occur with  improper specimen collection/handling, submission of specimen other than nasopharyngeal swab, presence of viral mutation(s) within the areas targeted by this assay, and inadequate number of viral copies(<138 copies/mL). A negative result must be combined with clinical observations, patient history, and epidemiological information. The expected result is Negative.  Fact Sheet for Patients:  EntrepreneurPulse.com.au  Fact Sheet for Healthcare Providers:  IncredibleEmployment.be  This test is no                          t yet approved or cleared by the [place] FDA and  has been authorized for detection and/or diagnosis of SARS-CoV-2 by FDA under an Emergency Use Authorization (EUA). This EUA will remain  in effect (meaning this test can be used) for  the duration of the COVID-19 declaration under Section 564(b)(1) of the Act, 21 U.S.C.section 360bbb-3(b)(1), unless the authorization is terminated  or revoked sooner.       Influenza A by PCR 11/10/2022 NEGATIVE  NEGATIVE Final   Influenza B by PCR 11/10/2022 NEGATIVE  NEGATIVE Final   Comment: (NOTE) The Xpert Xpress SARS-CoV-2/FLU/RSV plus assay is intended as an aid in the diagnosis of influenza from Nasopharyngeal swab specimens and should not be used as a sole basis for treatment. Nasal washings and aspirates are unacceptable for Xpert Xpress SARS-CoV-2/FLU/RSV testing.  Fact Sheet for Patients: EntrepreneurPulse.com.au  Fact Sheet for Healthcare Providers: IncredibleEmployment.be  This test is not yet approved or cleared by the [place] FDA and has been authorized for detection and/or diagnosis of SARS-CoV-2 by FDA under an Emergency Use Authorization (EUA). This EUA will remain in effect (meaning this test can be used) for the duration of the COVID-19 declaration under Section 564(b)(1) of the Act, 21 U.S.C. section 360bbb-3(b)(1), unless the authorization is terminated or revoked.     Resp Syncytial Virus by PCR 11/10/2022 NEGATIVE  NEGATIVE Final   Comment: (NOTE) Fact Sheet for Patients: EntrepreneurPulse.com.au  Fact Sheet for Healthcare Providers: IncredibleEmployment.be  This test is not yet approved or cleared by the [place] FDA and has been authorized for detection and/or diagnosis of SARS-CoV-2 by FDA under an Emergency Use Authorization (EUA). This EUA will remain in effect (meaning this test can be used) for the duration of the COVID-19 declaration under Section 564(b)(1) of the Act, 21 U.S.C. section 360bbb-3(b)(1), unless the authorization is terminated or revoked.  Performed at [facility], [place] [address], [place], [place] [address]    WBC 11/10/2022 6.5   4.0 - 10.5 K/uL Final  RBC 11/10/2022 5.38  4.22 - 5.81 MIL/uL Final   Hemoglobin 11/10/2022 14.1  13.0 - 17.0 g/dL Final   HCT 11/10/2022 42.1  39.0 - 52.0 % Final   MCV 11/10/2022 78.3 (L)  80.0 - 100.0 fL Final   MCH 11/10/2022 26.2  26.0 - 34.0 pg Final   MCHC 11/10/2022 33.5  30.0 - 36.0 g/dL Final   RDW 11/10/2022 17.2 (H)  11.5 - 15.5 % Final   Platelets 11/10/2022 201  150 - 400 K/uL Final   nRBC 11/10/2022 0.0  0.0 - 0.2 % Final   Neutrophils Relative % 11/10/2022 72  % Final   Neutro Abs 11/10/2022 4.6  1.7 - 7.7 K/uL Final   Lymphocytes Relative 11/10/2022 18  % Final   Lymphs Abs 11/10/2022 1.2  0.7 - 4.0 K/uL Final   Monocytes Relative 11/10/2022 9  % Final   Monocytes Absolute 11/10/2022 0.6  0.1 - 1.0 K/uL Final   Eosinophils Relative 11/10/2022 0  % Final   Eosinophils Absolute 11/10/2022 0.0  0.0 - 0.5 K/uL Final   Basophils Relative 11/10/2022 1  % Final   Basophils Absolute 11/10/2022 0.1  0.0 - 0.1 K/uL Final   Immature Granulocytes 11/10/2022 0  % Final   Abs Immature Granulocytes 11/10/2022 0.02  0.00 - 0.07 K/uL Final   Performed at [facility], [place] [address], [place], [place] [address]   Sodium 11/10/2022 132 (L)  135 - 145 mmol/L Final   Potassium 11/10/2022 4.7  3.5 - 5.1 mmol/L Final   Chloride 11/10/2022 91 (L)  98 - 111 mmol/L Final   CO2 11/10/2022 26  22 - 32 mmol/L Final   Glucose, Bld 11/10/2022 80  70 - 99 mg/dL Final   Glucose reference range applies only to samples taken after fasting for at least 8 hours.   BUN 11/10/2022 7  6 - 20 mg/dL Final   Creatinine, Ser 11/10/2022 0.83  0.61 - 1.24 mg/dL Final   Calcium 11/10/2022 9.0  8.9 - 10.3 mg/dL Final   Total Protein 11/10/2022 9.3 (H)  6.5 - 8.1 g/dL Final   Albumin 11/10/2022 4.4  3.5 - 5.0 g/dL Final   AST 11/10/2022 111 (H)  15 - 41 U/L Final   ALT 11/10/2022 83 (H)  0 - 44 U/L Final   Alkaline Phosphatase 11/10/2022 43  38 - 126 U/L Final   Total Bilirubin 11/10/2022 0.8  0.3 - 1.2  mg/dL Final   GFR, Estimated 11/10/2022 >60  >60 mL/min Final   Comment: (NOTE) Calculated using the CKD-EPI Creatinine Equation (2021)    Anion gap 11/10/2022 15  5 - 15 Final   Performed at [place] [address] [place], [place] [address]   Magnesium 11/10/2022 2.2  1.7 - 2.4 mg/dL Final   Performed at [place] [address], [place], [place] [address]   Alcohol, Ethyl (B) 11/10/2022 347 (HH)  <10 mg/dL Final   Comment: CRITICAL RESULT CALLED TO, READ BACK BY AND VERIFIED WITH Mollie Germany, RN @ [phone] 11/10/22 BY SEKDAHL (NOTE) Lowest detectable limit for serum alcohol is 10 mg/dL.  For medical purposes only. Performed at [facility], [place] [address], [place], [place] [address]    TSH 11/10/2022 0.798  0.350 - 4.500 uIU/mL Final   Comment: Performed by a 3rd Generation assay with a functional sensitivity of <=0.01 uIU/mL. Performed at [facility], [place] [address] [place], [place] [address]  Color, Urine 11/10/2022 YELLOW  YELLOW Final   APPearance 11/10/2022 CLEAR  CLEAR Final   Specific Gravity, Urine 11/10/2022 1.010  1.005 - 1.030 Final   pH 11/10/2022 6.0  5.0 - 8.0 Final   Glucose, UA 11/10/2022 NEGATIVE  NEGATIVE mg/dL Final   Hgb urine dipstick 11/10/2022 SMALL (A)  NEGATIVE Final   Bilirubin Urine 11/10/2022 NEGATIVE  NEGATIVE Final   Ketones, ur 11/10/2022 NEGATIVE  NEGATIVE mg/dL Final   Protein, ur 11/10/2022 100 (A)  NEGATIVE mg/dL Final   Nitrite 11/10/2022 NEGATIVE  NEGATIVE Final   Leukocytes,Ua 11/10/2022 NEGATIVE  NEGATIVE Final   RBC / HPF 11/10/2022 0-5  0 - 5 RBC/hpf Final   WBC, UA 11/10/2022 0-5  0 - 5 WBC/hpf Final   Bacteria, UA 11/10/2022 NONE SEEN  NONE SEEN Final   Squamous Epithelial / LPF 11/10/2022 0-5  0 - 5 Final   Performed at [facility], [place] [address], [place], [place] [address]   POC Amphetamine UR 11/10/2022 None Detected  NONE DETECTED (Cut Off Level 1000 ng/mL) Final   POC Secobarbital  (BAR) 11/10/2022 None Detected  NONE DETECTED (Cut Off Level 300 ng/mL) Final   POC Buprenorphine (BUP) 11/10/2022 None Detected  NONE DETECTED (Cut Off Level 10 ng/mL) Final   POC Oxazepam (BZO) 11/10/2022 None Detected  NONE DETECTED (Cut Off Level 300 ng/mL) Final   POC Cocaine UR 11/10/2022 None Detected  NONE DETECTED (Cut Off Level 300 ng/mL) Final   POC Methamphetamine UR 11/10/2022 None Detected  NONE DETECTED (Cut Off Level 1000 ng/mL) Final   POC Morphine 11/10/2022 None Detected  NONE DETECTED (Cut Off Level 300 ng/mL) Final   POC Methadone UR 11/10/2022 None Detected  NONE DETECTED (Cut Off Level 300 ng/mL) Final   POC Oxycodone UR 11/10/2022 None Detected  NONE DETECTED (Cut Off Level 100 ng/mL) Final   POC Marijuana UR 11/10/2022 None Detected  NONE DETECTED (Cut Off Level 50 ng/mL) Final   Troponin I (High Sensitivity) 11/10/2022 5  <18 ng/L Final   Comment: (NOTE) Elevated high sensitivity troponin I (hsTnI) values and significant  changes across serial measurements may suggest ACS but many other  chronic and acute conditions are known to elevate hsTnI results.  Refer to the "Links" section for chest pain algorithms and additional  guidance. Performed at [facility], [place] [address] [place], [place] [address]    SARSCOV2ONAVIRUS 2 AG 11/10/2022 NEGATIVE  NEGATIVE Final   Comment: (NOTE) SARS-CoV-2 antigen NOT DETECTED.   Negative results are presumptive.  Negative results do not preclude SARS-CoV-2 infection and should not be used as the sole basis for treatment or other patient management decisions, including infection  control decisions, particularly in the presence of clinical signs and  symptoms consistent with COVID-19, or in those who have been in contact with the virus.  Negative results must be combined with clinical observations, patient history, and epidemiological information. The expected result is Negative.  Fact Sheet for Patients:  HandmadeRecipes.com.cy  Fact Sheet for Healthcare Providers: FuneralLife.at  This test is not yet approved or cleared by the [place] FDA and  has been authorized for detection and/or diagnosis of SARS-CoV-2 by FDA under an Emergency Use Authorization (EUA).  This EUA will remain in effect (meaning this test can be used) for the duration of  the COV  ID-19 declaration under Section 564(b)(1) of the Act, 21 U.S.C. section 360bbb-3(b)(1), unless the authorization is terminated or revoked sooner.     Cholesterol 11/10/2022 314 (H)  0 - 200 mg/dL Final   Triglycerides 11/10/2022 109  <150 mg/dL Final   HDL 11/10/2022 >135  >40 mg/dL Final   Total CHOL/HDL Ratio 11/10/2022 NOT CALCULATED  RATIO Final   VLDL 11/10/2022 22  0 - 40 mg/dL Final   LDL Cholesterol 11/10/2022 NOT CALCULATED  0 - 99 mg/dL Final   Performed at [facility], [place] [address], [place], [place] [address]    Blood Alcohol level:  Lab Results  Component Value Date   ETH 161 (H) 11/10/2022   ETH 347 (HH) 16/11/930    Metabolic Disorder Labs: Lab Results  Component Value Date   HGBA1C 5.6 03/27/2022   MPG 114.02 03/27/2022   MPG 114.02 12/27/2021   No results found for: "PROLACTIN" Lab Results  Component Value Date   CHOL 314 (H) 11/10/2022   TRIG 109 11/10/2022   HDL >135 11/10/2022   CHOLHDL NOT CALCULATED 11/10/2022   VLDL 22 11/10/2022   LDLCALC NOT CALCULATED 11/10/2022   LDLCALC 142 (H) 04/13/2022    Therapeutic Lab Levels: No results found for: "LITHIUM" No results found for: "VALPROATE" No results found for: "CBMZ"  Physical Findings   AIMS    Flowsheet Row Admission (Discharged) from 03/16/2021 in [facility] 400B Admission (Discharged) from 05/04/2018 in [facility] 300B Admission (Discharged) from 06/29/2017 in [facility]  300B  AIMS Total Score 0 0 0      AUDIT    Flowsheet Row Admission (Discharged) from 03/16/2021 in [facility] 400B Admission (Discharged) from 05/04/2018 in [facility] 300B Admission (Discharged) from 06/29/2017 in [facility] 300B Admission (Discharged) from 09/06/2014 in [place] [facility] Admission (Discharged) from 06/01/2014 in [facility] 300B  Alcohol Use Disorder Identification Test Final Score (AUDIT) 40 34 35 26 [place] Office Visit from 05/31/2022 in [facility] Office Visit from 04/13/2022 in [facility] Office Visit from 12/02/2021 in [facility] Office Visit from 10/10/2021 in [facility] Office Visit from 08/18/2021 in [facility] 1  Total GAD-7 Score 7 0 '13 13 11      '$ PHQ2-9    Flowsheet Row ED from 11/11/2022 in [facility] ED from 11/10/2022 in [facility] Office Visit from 05/31/2022 in [facility] Office Visit from 04/13/2022 in [facility] ED from 03/27/2022 in [facility]  PHQ-2 Total Score '3 6 1 1 1  '$ PHQ-9 Total Score '15 20 5 '$ -- [facility] ED from 11/11/2022 in [facility] Most recent reading at 11/11/2022 12:04 PM ED from 11/10/2022 in [facility] Most recent reading at 11/10/2022 10:10 PM ED from 11/10/2022 in [facility] Most recent reading at 11/10/2022  2:33 PM  C-SSRS RISK CATEGORY No Risk No Risk No Risk        Musculoskeletal  Strength & Muscle Tone: decreased Gait & Station: shuffle Patient leans: N/A  Psychiatric Specialty Exam  Presentation  General Appearance:  Appropriate  for Environment; Casual  Eye Contact: Good  Speech: Clear and Coherent  Speech Volume: Normal  Handedness: Right   Mood and Affect  Mood: Dysphoric; Anxious  Affect: Appropriate   Thought Process  Thought Processes: Goal Directed  Descriptions of Associations:Circumstantial  Orientation:Full (Time, Place and Person)  Thought Content:Logical  Diagnosis of Schizophrenia or Schizoaffective disorder in past: No    Hallucinations:Hallucinations: Visual Description of Visual Hallucinations: " I see these knats, and I sawt at them. It started when I came here."  Ideas of Reference:None  Suicidal Thoughts:Suicidal Thoughts: No  Homicidal Thoughts:Homicidal Thoughts: No   Sensorium  Memory: Immediate Fair; Recent Fair  Judgment: Good  Insight: Good   Executive Functions  Concentration: Fair  Attention Span: Fair  Recall: Poor (0/3)  Fund of Knowledge: Fair  Language: Fair   Psychomotor Activity  Psychomotor Activity: Psychomotor Activity: Decreased   Assets  Assets: Desire for Improvement; Communication Skills; Resilience; Social Support   Sleep  Sleep: Sleep: Good   No data recorded  Physical Exam  Physical Exam HENT:     Head: Normocephalic and atraumatic.  Pulmonary:     Effort: Pulmonary effort is normal.  Neurological:     Mental Status: He is alert and oriented to person, place, and time.    Review of Systems  Psychiatric/Behavioral:  Positive for depression. Negative for hallucinations and suicidal ideas. The patient is nervous/anxious. The patient does not have insomnia.    Blood pressure 99/70, pulse (!) 113, temperature 98 F (36.7 C), temperature source Oral, resp. rate 19, SpO2 98 %. There is no height or weight on file to calculate BMI.  Treatment Plan Summary: Daily contact with patient to assess and evaluate symptoms and progress in treatment and Medication management    Patient appears better than  yesterday, and endorses that some of his symptoms of withdrawal are improving. Patient did have elevated pulse this AM with a lower BP, but his hypotension was asymptomatic. Patient does have severe anxiety at baseline, will continue on current regimen. Patient concentration was also noted to be slightly better today.    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

## 2022-11-12 NOTE — ED Notes (Signed)
Pt accepted scheduled meds w/o difficulty. Patient sitting in the dayroom in no distress. Attended group meeting this morning. Meal provided earlier in the shift. Safety maintained and will continue to monitor.

## 2022-11-12 NOTE — ED Notes (Signed)
Patient is calm and cooperative. When passing medications patient complained of pain in his back and left leg 7 out of 10 he stated that the pain is worsening since his admission to unit. Stated that he "will go to the emergency room when I leave here. Patient is currently resting with eyes closed, snoring. Respirations even and unlabored will continue to monitor for safety.

## 2022-11-13 ENCOUNTER — Emergency Department (HOSPITAL_COMMUNITY): Payer: Self-pay

## 2022-11-13 ENCOUNTER — Encounter (HOSPITAL_COMMUNITY): Payer: Self-pay

## 2022-11-13 ENCOUNTER — Emergency Department (HOSPITAL_COMMUNITY)
Admission: EM | Admit: 2022-11-13 | Discharge: 2022-11-15 | Disposition: A | Discharge status: Other Acute Inpatient Hospital | Payer: Self-pay | Attending: Emergency Medicine | Admitting: Emergency Medicine

## 2022-11-13 ENCOUNTER — Other Ambulatory Visit: Payer: Self-pay

## 2022-11-13 DIAGNOSIS — Z1152 Encounter for screening for COVID-19: Secondary | ICD-10-CM | POA: Insufficient documentation

## 2022-11-13 DIAGNOSIS — R2242 Localized swelling, mass and lump, left lower limb: Secondary | ICD-10-CM | POA: Insufficient documentation

## 2022-11-13 DIAGNOSIS — F411 Generalized anxiety disorder: Secondary | ICD-10-CM | POA: Diagnosis not present

## 2022-11-13 DIAGNOSIS — M5442 Lumbago with sciatica, left side: Secondary | ICD-10-CM | POA: Insufficient documentation

## 2022-11-13 DIAGNOSIS — G629 Polyneuropathy, unspecified: Secondary | ICD-10-CM | POA: Diagnosis not present

## 2022-11-13 DIAGNOSIS — I10 Essential (primary) hypertension: Secondary | ICD-10-CM | POA: Insufficient documentation

## 2022-11-13 DIAGNOSIS — Z79899 Other long term (current) drug therapy: Secondary | ICD-10-CM | POA: Diagnosis not present

## 2022-11-13 DIAGNOSIS — R531 Weakness: Secondary | ICD-10-CM | POA: Insufficient documentation

## 2022-11-13 DIAGNOSIS — M79605 Pain in left leg: Secondary | ICD-10-CM | POA: Insufficient documentation

## 2022-11-13 DIAGNOSIS — F1020 Alcohol dependence, uncomplicated: Secondary | ICD-10-CM | POA: Diagnosis not present

## 2022-11-13 DIAGNOSIS — M25562 Pain in left knee: Secondary | ICD-10-CM | POA: Insufficient documentation

## 2022-11-13 DIAGNOSIS — G8929 Other chronic pain: Secondary | ICD-10-CM | POA: Insufficient documentation

## 2022-11-13 LAB — CBC WITH DIFFERENTIAL/PLATELET
Abs Immature Granulocytes: 0.02 10*3/uL (ref 0.00–0.07)
Abs Immature Granulocytes: 0.04 10*3/uL (ref 0.00–0.07)
Basophils Absolute: 0 10*3/uL (ref 0.0–0.1)
Basophils Absolute: 0 10*3/uL (ref 0.0–0.1)
Basophils Relative: 1 %
Basophils Relative: 1 %
Eosinophils Absolute: 0.1 10*3/uL (ref 0.0–0.5)
Eosinophils Absolute: 0.1 10*3/uL (ref 0.0–0.5)
Eosinophils Relative: 1 %
Eosinophils Relative: 1 %
HCT: 32.4 % — ABNORMAL LOW (ref 39.0–52.0)
HCT: 36.4 % — ABNORMAL LOW (ref 39.0–52.0)
Hemoglobin: 10.8 g/dL — ABNORMAL LOW (ref 13.0–17.0)
Hemoglobin: 11.8 g/dL — ABNORMAL LOW (ref 13.0–17.0)
Immature Granulocytes: 0 %
Immature Granulocytes: 1 %
Lymphocytes Relative: 11 %
Lymphocytes Relative: 20 %
Lymphs Abs: 0.6 10*3/uL — ABNORMAL LOW (ref 0.7–4.0)
Lymphs Abs: 1.4 10*3/uL (ref 0.7–4.0)
MCH: 26.6 pg (ref 26.0–34.0)
MCH: 26.9 pg (ref 26.0–34.0)
MCHC: 33.3 g/dL (ref 30.0–36.0)
MCHC: 32.4 g/dL (ref 30.0–36.0)
MCV: 79.8 fL — ABNORMAL LOW (ref 80.0–100.0)
MCV: 82.9 fL (ref 80.0–100.0)
Monocytes Absolute: 0.7 10*3/uL (ref 0.1–1.0)
Monocytes Absolute: 0.9 10*3/uL (ref 0.1–1.0)
Monocytes Relative: 13 %
Monocytes Relative: 13 %
Neutro Abs: 4.3 10*3/uL (ref 1.7–7.7)
Neutro Abs: 4.8 10*3/uL (ref 1.7–7.7)
Neutrophils Relative %: 74 %
Neutrophils Relative %: 64 %
Platelets: 155 10*3/uL (ref 150–400)
Platelets: 180 10*3/uL (ref 150–400)
RBC: 4.06 MIL/uL — ABNORMAL LOW (ref 4.22–5.81)
RBC: 4.39 MIL/uL (ref 4.22–5.81)
RDW: 17 % — ABNORMAL HIGH (ref 11.5–15.5)
RDW: 17.3 % — ABNORMAL HIGH (ref 11.5–15.5)
WBC: 5.8 10*3/uL (ref 4.0–10.5)
WBC: 7.2 10*3/uL (ref 4.0–10.5)
nRBC: 0 % (ref 0.0–0.2)
nRBC: 0 % (ref 0.0–0.2)

## 2022-11-13 LAB — COMPREHENSIVE METABOLIC PANEL
ALT: 39 U/L (ref 0–44)
AST: 36 U/L (ref 15–41)
Albumin: 3.6 g/dL (ref 3.5–5.0)
Alkaline Phosphatase: 36 U/L — ABNORMAL LOW (ref 38–126)
Anion gap: 7 (ref 5–15)
BUN: 12 mg/dL (ref 6–20)
CO2: 29 mmol/L (ref 22–32)
Calcium: 9.1 mg/dL (ref 8.9–10.3)
Chloride: 98 mmol/L (ref 98–111)
Creatinine, Ser: 1.04 mg/dL (ref 0.61–1.24)
GFR, Estimated: >60 mL/min (ref >60)
Glucose, Bld: 102 mg/dL — ABNORMAL HIGH (ref 70–99)
Potassium: 4 mmol/L (ref 3.5–5.1)
Sodium: 134 mmol/L — ABNORMAL LOW (ref 135–145)
Total Bilirubin: 1 mg/dL (ref 0.3–1.2)
Total Protein: 7.7 g/dL (ref 6.5–8.1)

## 2022-11-13 LAB — HEMOGLOBIN A1C
Hgb A1c MFr Bld: 6 % — ABNORMAL HIGH (ref 4.8–5.6)
Mean Plasma Glucose: 126 mg/dL

## 2022-11-13 MED ORDER — CHLORDIAZEPOXIDE HCL 25 MG PO CAPS: 25.0000 mg | ORAL_CAPSULE | Freq: Three times a day (TID) | ORAL | 0 refills | Status: DC

## 2022-11-13 MED ORDER — HYDROXYZINE HCL 25 MG PO TABS
25.0000 mg | ORAL_TABLET | Freq: Four times a day (QID) | ORAL | 0 refills | Status: DC | PRN
  Filled 2022-11-13: qty 30, 8d supply, fill #0

## 2022-11-13 MED ORDER — TRAZODONE HCL 50 MG PO TABS: 50.0000 mg | ORAL_TABLET | Freq: Every evening | ORAL | Status: DC | PRN

## 2022-11-13 MED ORDER — CHLORDIAZEPOXIDE HCL 25 MG PO CAPS: 25.0000 mg | ORAL_CAPSULE | Freq: Once | ORAL | 0 refills | Status: AC

## 2022-11-13 MED ORDER — IBUPROFEN 400 MG PO TABS
600.0000 mg | ORAL_TABLET | Freq: Once | ORAL | Status: AC
  Administered 2022-11-13: 600 mg via ORAL
  Filled 2022-11-13: qty 1

## 2022-11-13 MED ORDER — CHLORDIAZEPOXIDE HCL 25 MG PO CAPS: 25.0000 mg | ORAL_CAPSULE | Freq: Two times a day (BID) | ORAL | 0 refills | Status: DC

## 2022-11-13 MED ORDER — ESCITALOPRAM OXALATE 10 MG PO TABS: 10.0000 mg | ORAL_TABLET | Freq: Every day | ORAL | 0 refills | Status: DC

## 2022-11-13 NOTE — ED Notes (Signed)
Patient did not attend the [facility] Meeting.

## 2022-11-13 NOTE — ED Triage Notes (Signed)
Pt coming from [facility] with [facility] c.o left leg pain, worsening over the last day, difficulty walking. Pt at [facility] voluntarily for alcohol treatment. Denies SI.

## 2022-11-13 NOTE — ED Notes (Signed)
Report called to [facility].  Ambulance transport service has been set up.  Will continue to monitor for safety.

## 2022-11-13 NOTE — Discharge Instructions (Addendum)
Follow-up recommendations:  Activity:  Normal, as tolerated Diet:  Per PCP recommendation  Patient is instructed prior to discharge to: Take all medications as prescribed by his mental healthcare provider. Report any adverse effects and/or reactions from the medicines to his outpatient provider promptly. Patient has been instructed & cautioned: To not engage in alcohol and or illegal drug use while on prescription medicines.  In the event of worsening symptoms, patient is instructed to call the crisis hotline at 988, 911 and or go to the nearest ED for appropriate evaluation and treatment of symptoms. To follow-up with his primary care provider for your other medical issues, concerns and or health care needs.  

## 2022-11-13 NOTE — ED Provider Triage Note (Signed)
Emergency Medicine Provider Triage Evaluation Note  [name] [name] , a 61 y.o. male  was evaluated in triage.  Pt complains of left low back pain with radiation into his left lower leg that began about 3 days prior. No obvious injury or accident prior to pain starting. Has some weakness in left leg, but gait appears only slightly altered. No obvious neurological deficits seen. Denies headache, fevers, shortness of breath, chest pain, loss of bowel or bladder control, or saddle paraesthesia.  Review of Systems  Positive: As above Negative: As above  Physical Exam  BP 110/64   Pulse 76   Temp 98.3 F (36.8 C) (Oral)   Resp 17   SpO2 99%  Gen:   Awake, no distress  Resp:  Normal effort  MSK:   Decreased strength on plantarflexion of left foot when compared to right.  Other:  Neurovascularly intact  Medical Decision Making  Medically screening exam initiated at 1:13 PM.  Appropriate orders placed.  [name] was informed that the remainder of the evaluation will be completed by another provider, this initial triage assessment does not replace that evaluation, and the importance of remaining in the ED until their evaluation is complete.     Luvenia Heller, PA-C 11/13/22 1315

## 2022-11-13 NOTE — ED Notes (Signed)
Pt resting quietly, breathing is even and unlabored.  Pt denies SI, HI, and AVH.  Pt states he is having pain in his left leg and lower back, prn medication will be provided. Will continue to monitor for safety.

## 2022-11-13 NOTE — ED Notes (Signed)
Pt asking for something to eat, Triage PA gave OK for bag lunch provided.

## 2022-11-13 NOTE — ED Notes (Signed)
Patient resting quietly in bed with eyes closed, no S/S of distress respirations even and unlabored. Will continue to monitor for safety.

## 2022-11-13 NOTE — ED Notes (Signed)
Pt ambulating with care.  Staff assisted pt to room.  Encouraged pt to let staff know when he needs to get up to assist with ambulation.

## 2022-11-13 NOTE — ED Provider Notes (Signed)
Behavioral Health Progress Note  Date and Time: 11/13/2022 8:02 AM Name: [name] [name] MRN:  [id number]  Subjective:   [name] [name] is a 61 yo male w/ hx of MDD, anxiety, Alcohol Use Disorder, homelessness, sarcoidosis presenting to [facility] for assistance for alcohol detox and residential rehab.      Patient's primary concern today is that he has experienced difficulty ambulating and pain and weakness of bilateral lower extremities, left greater than right, leading to increased falling and pain.  He reports that his cravings are less today about her urge to act upon them.  He denies SI, HI, AVH, and tactile hallucinations, paranoia, and does not voice delusions. He denies further somatic concerns. His appetite and sleep have both improved.  Diagnosis:  Final diagnoses:  Alcohol use disorder, severe, dependence (HCC)  GAD (generalized anxiety disorder)    Total Time spent with patient: 20 minutes  Past Psychiatric History: Inpatient: Denies Outpatient: Positive Patient currently being seen at [place] and prescribed Lexapro, gabapentin, trazodone.  Patient reports he has had benefit with all previous medications.  Patient reports that he has previously used but did not find it beneficial.   Patient reports he was in "special ed" while growing up Past Medical History:  Past Medical History:  Diagnosis Date   Alcohol abuse    Anxiety and depression    Bilateral primary osteoarthritis of knee    Carpal tunnel syndrome of right wrist Dx 2015   Cervical stenosis of spine    MRI 10/2021   Essential hypertension    Gallbladder sludge    GERD (gastroesophageal reflux disease) Dx 2007   Hepatic steatosis    History of alcoholic hepatitis    History of CVA (cerebrovascular accident)    05/2021 MRI brain: Mild chronic microvascular ischemic change in the white matter. Small chronic infarct left superior cerebellum.  Also, chronic microhemorrhage, ? 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   Seborrheic dermatitis     Past Surgical History:  Procedure Laterality Date   carpel tunnel release Right 07/28/2014    done in [facility]     KNEE SURGERY Left    15 years ago   LIPOMA EXCISION N/A 05/10/2016   Procedure: EXCISION OF SCALP LIPOMA;  Surgeon: Johnathan Hausen, MD;  Location: [facility];  Service: General;  Laterality: N/A;   Family History:  Family History  Problem Relation Age of Onset   Diabetes Father    Hypertension Father    Hypertension Paternal Grandfather    Alcohol abuse Paternal Grandfather    Alcohol abuse Maternal Grandfather    Alcohol abuse Maternal Grandmother    Alcohol abuse Paternal Grandmother    Colon cancer Neg Hx    Rectal cancer Neg Hx    Stomach cancer Neg Hx    Bipolar disorder Neg Hx    Depression Neg Hx    Family Psychiatric  History: Patient endorses multiple family members including both of his grandfathers, aunts, and uncles having EtOH use disorder   Social History:  Social History   Substance and Sexual Activity  Alcohol Use Yes   Comment: clean for 4 months     Social History   Substance and Sexual Activity  Drug Use Not Currently   Types: Marijuana   Comment: Patient denies     Social History   Socioeconomic History   Marital status: Legally Separated    Spouse name: Not on file   Number of children: 2   Years of education: 13 yrs   Highest education level: Not on file  Occupational History   Occupation: custodian    Comment: [employer]  Tobacco Use   Smoking status: Never   Smokeless tobacco: Never  Vaping Use   Vaping Use: Never used  Substance and Sexual Activity    Alcohol use: Yes    Comment: clean for 4 months   Drug use: Not Currently    Types: Marijuana    Comment: Patient denies    Sexual activity: Not Currently  Other Topics Concern   Not on file  Social History Narrative   Born and raised in [place], [place] by both parents. He has 2 sisters and is the middle child. He remains close with family. He was married for 16 yrs and seperated in 2005. He has 2 boys who are in their 20's now. He lives alone in [place]. He is working as a Charity fundraiser handed    Lives in a one story home    Social Determinants of Radio broadcast assistant Strain: Not on file  Food Insecurity: Not on file  Transportation Needs: Not on file  Physical Activity: Not on file  Stress: Not on file  Social Connections: Not on file   SDOH:  SDOH Screenings   Alcohol Screen: Medium Risk (03/16/2021)  Depression (PHQ2-9): High Risk (11/11/2022)  Tobacco Use: Low Risk  (11/10/2022)   Additional Social History:                         Sleep: Good  Appetite:  Fair  Current Medications:  Current Facility-Administered Medications  Medication Dose Route Frequency Provider Last Rate Last Admin   acetaminophen (TYLENOL) tablet 650 mg  650 mg Oral Q6H PRN Damita Dunnings B, MD   650 mg at 11/12/22 2140   alum & mag hydroxide-simeth (MAALOX/MYLANTA) 200-200-20 MG/5ML suspension 30 mL  30 mL Oral Q4H PRN Damita Dunnings B, MD       chlordiazePOXIDE (LIBRIUM) capsule 25 mg  25 mg Oral QID Damita Dunnings B, MD   25 mg at 11/12/22 2141   Followed by   chlordiazePOXIDE (LIBRIUM) capsule 25 mg  25 mg Oral TID Freida Busman, MD       Followed by   Derrill Memo ON 11/14/2022] chlordiazePOXIDE (LIBRIUM) capsule 25 mg  25 mg Oral BID Damita Dunnings B, MD       Followed by   Derrill Memo ON 11/15/2022] chlordiazePOXIDE (LIBRIUM) capsule 25 mg  25 mg Oral Once Damita Dunnings B, MD       escitalopram (LEXAPRO) tablet 10 mg  10 mg Oral Daily McQuilla, Joyce Gross B, MD   10 mg at 11/12/22 0930    gabapentin (NEURONTIN) capsule 200 mg  200 mg Oral TID Damita Dunnings B, MD   200 mg at 11/12/22 2141   hydrOXYzine (ATARAX) tablet 25 mg  25 mg Oral Q6H PRN Damita Dunnings B, MD   25 mg at 11/12/22 2141   loperamide (IMODIUM) capsule 2-4 mg  2-4  mg Oral PRN Freida Busman, MD       LORazepam (ATIVAN) tablet 1 mg  1 mg Oral Q6H PRN Freida Busman, MD       Or   LORazepam (ATIVAN) injection 2 mg  2 mg Intramuscular Q6H PRN Damita Dunnings B, MD       magnesium hydroxide (MILK OF MAGNESIA) suspension 30 mL  30 mL Oral Daily PRN Damita Dunnings B, MD       multivitamin with minerals tablet 1 tablet  1 tablet Oral Daily Damita Dunnings B, MD   1 tablet at 11/12/22 0930   ondansetron (ZOFRAN-ODT) disintegrating tablet 4 mg  4 mg Oral Q6H PRN Freida Busman, MD       traZODone (DESYREL) tablet 50 mg  50 mg Oral QHS PRN Damita Dunnings B, MD   50 mg at 11/11/22 2107   Current Outpatient Medications  Medication Sig Dispense Refill   escitalopram (LEXAPRO) 20 MG tablet Take 1 tablet (20 mg total) by mouth once daily. 30 tablet 3   gabapentin (NEURONTIN) 100 MG capsule Take 2 capsules (200 mg total) by mouth 3 (three) times daily. 180 capsule 3    Labs  Lab Results:  Admission on 11/11/2022  Component Date Value Ref Range Status   Sodium 11/12/2022 134 (L)  135 - 145 mmol/L Final   Potassium 11/12/2022 4.5  3.5 - 5.1 mmol/L Final   Chloride 11/12/2022 97 (L)  98 - 111 mmol/L Final   CO2 11/12/2022 30  22 - 32 mmol/L Final   Glucose, Bld 11/12/2022 84  70 - 99 mg/dL Final   Glucose reference range applies only to samples taken after fasting for at least 8 hours.   BUN 11/12/2022 8  6 - 20 mg/dL Final   Creatinine, Ser 11/12/2022 1.32 (H)  0.61 - 1.24 mg/dL Final   Calcium 11/12/2022 9.2  8.9 - 10.3 mg/dL Final   Total Protein 11/12/2022 8.0  6.5 - 8.1 g/dL Final   Albumin 11/12/2022 3.8  3.5 - 5.0 g/dL Final   AST 11/12/2022 52 (H)  15 - 41 U/L Final   ALT 11/12/2022 48 (H)  0 - 44 U/L Final    Alkaline Phosphatase 11/12/2022 37 (L)  38 - 126 U/L Final   Total Bilirubin 11/12/2022 0.9  0.3 - 1.2 mg/dL Final   GFR, Estimated 11/12/2022 >60  >60 mL/min Final   Comment: (NOTE) Calculated using the CKD-EPI Creatinine Equation (2021)    Anion gap 11/12/2022 7  5 - 15 Final   Performed at [facility] [address], [place], [place] [address]  Admission on 11/10/2022, Discharged on 11/11/2022  Component Date Value Ref Range Status   WBC 11/10/2022 10.1  4.0 - 10.5 K/uL Final   RBC 11/10/2022 5.11  4.22 - 5.81 MIL/uL Final   Hemoglobin 11/10/2022 13.3  13.0 - 17.0 g/dL Final   HCT 11/10/2022 40.7  39.0 - 52.0 % Final   MCV 11/10/2022 79.6 (L)  80.0 - 100.0 fL Final   MCH 11/10/2022 26.0  26.0 - 34.0 pg Final   MCHC 11/10/2022 32.7  30.0 - 36.0 g/dL Final   RDW 11/10/2022 17.1 (H)  11.5 - 15.5 % Final   Platelets 11/10/2022 195  150 - 400 K/uL Final   nRBC 11/10/2022 0.0  0.0 - 0.2 % Final   Neutrophils Relative % 11/10/2022 45  % Final   Neutro Abs 11/10/2022 4.5  1.7 - 7.7 K/uL Final  Lymphocytes Relative 11/10/2022 41  % Final   Lymphs Abs 11/10/2022 4.2 (H)  0.7 - 4.0 K/uL Final   Monocytes Relative 11/10/2022 12  % Final   Monocytes Absolute 11/10/2022 1.2 (H)  0.1 - 1.0 K/uL Final   Eosinophils Relative 11/10/2022 1  % Final   Eosinophils Absolute 11/10/2022 0.1  0.0 - 0.5 K/uL Final   Basophils Relative 11/10/2022 1  % Final   Basophils Absolute 11/10/2022 0.1  0.0 - 0.1 K/uL Final   Immature Granulocytes 11/10/2022 0  % Final   Abs Immature Granulocytes 11/10/2022 0.03  0.00 - 0.07 K/uL Final   Performed at [facility], [place] [address] [place], [place] [address]   Troponin I (High Sensitivity) 11/10/2022 6  <18 ng/L Final   Comment: (NOTE) Elevated high sensitivity troponin I (hsTnI) values and significant  changes across serial measurements may suggest ACS but many other  chronic and acute conditions are known to elevate hsTnI results.   Refer to the "Links" section for chest pain algorithms and additional  guidance. Performed at [facility], [place] [address] [place], [place] [address]    Sodium 11/10/2022 130 (L)  135 - 145 mmol/L Final   Potassium 11/10/2022 4.2  3.5 - 5.1 mmol/L Final   Chloride 11/10/2022 92 (L)  98 - 111 mmol/L Final   CO2 11/10/2022 23  22 - 32 mmol/L Final   Glucose, Bld 11/10/2022 91  70 - 99 mg/dL Final   Glucose reference range applies only to samples taken after fasting for at least 8 hours.   BUN 11/10/2022 17  6 - 20 mg/dL Final   Creatinine, Ser 11/10/2022 1.24  0.61 - 1.24 mg/dL Final   Calcium 11/10/2022 8.9  8.9 - 10.3 mg/dL Final   Total Protein 11/10/2022 9.4 (H)  6.5 - 8.1 g/dL Final   Albumin 11/10/2022 4.4  3.5 - 5.0 g/dL Final   AST 11/10/2022 81 (H)  15 - 41 U/L Final   ALT 11/10/2022 73 (H)  0 - 44 U/L Final   Alkaline Phosphatase 11/10/2022 43  38 - 126 U/L Final   Total Bilirubin 11/10/2022 0.8  0.3 - 1.2 mg/dL Final   GFR, Estimated 11/10/2022 >60  >60 mL/min Final   Comment: (NOTE) Calculated using the CKD-EPI Creatinine Equation (2021)    Anion gap 11/10/2022 15  5 - 15 Final   Performed at [facility], [place] [address], [place], [place] [address]   Alcohol, Ethyl (B) 11/10/2022 161 (H)  <10 mg/dL Final   Comment: (NOTE) Lowest detectable limit for serum alcohol is 10 mg/dL.  For medical purposes only. Performed at [facility], [place] [address] [place], [place] [address]    Opiates 11/11/2022 NONE DETECTED  NONE DETECTED Final   Cocaine 11/11/2022 NONE DETECTED  NONE DETECTED Final   Benzodiazepines 11/11/2022 NONE DETECTED  NONE DETECTED Final   Amphetamines 11/11/2022 NONE DETECTED  NONE DETECTED Final   Tetrahydrocannabinol 11/11/2022 NONE DETECTED  NONE DETECTED Final   Barbiturates 11/11/2022 NONE DETECTED  NONE DETECTED Final   Comment: (NOTE) DRUG SCREEN FOR MEDICAL PURPOSES ONLY.  IF CONFIRMATION  IS NEEDED FOR ANY PURPOSE, NOTIFY LAB WITHIN 5 DAYS.  LOWEST DETECTABLE LIMITS FOR URINE DRUG SCREEN Drug Class                     Cutoff (ng/mL) Amphetamine and metabolites    1000 Barbiturate and metabolites    200  Benzodiazepine                 200 Opiates and metabolites        300 Cocaine and metabolites        300 THC                            50 Performed at [facility] [address], [place], [place] [address]    Lipase 11/10/2022 115 (H)  11 - 51 U/L Final   Performed at [facility], [place] [address] [place], [place] [address]   Troponin I (High Sensitivity) 11/11/2022 4  <18 ng/L Final   Comment: (NOTE) Elevated high sensitivity troponin I (hsTnI) values and significant  changes across serial measurements may suggest ACS but many other  chronic and acute conditions are known to elevate hsTnI results.  Refer to the "Links" section for chest pain algorithms and additional  guidance. Performed at [facility], [place] [address] [place],  [address]   Admission on 11/10/2022, Discharged on 11/10/2022  Component Date Value Ref Range Status   SARS Coronavirus 2 by RT PCR 11/10/2022 NEGATIVE  NEGATIVE Final   Comment: (NOTE) SARS-CoV-2 target nucleic acids are NOT DETECTED.  The SARS-CoV-2 RNA is generally detectable in upper respiratory specimens during the acute phase of infection. The lowest concentration of SARS-CoV-2 viral copies this assay can detect is 138 copies/mL. A negative result does not preclude SARS-Cov-2 infection and should not be used as the sole basis for treatment or other patient management decisions. A negative result may occur with  improper specimen collection/handling, submission of specimen other than nasopharyngeal swab, presence of viral mutation(s) within the areas targeted by this assay, and inadequate number of viral copies(<138 copies/mL). A negative result must be combined  with clinical observations, patient history, and epidemiological information. The expected result is Negative.  Fact Sheet for Patients:  EntrepreneurPulse.com.au  Fact Sheet for Healthcare Providers:  IncredibleEmployment.be  This test is no                          t yet approved or cleared by the [place] FDA and  has been authorized for detection and/or diagnosis of SARS-CoV-2 by FDA under an Emergency Use Authorization (EUA). This EUA will remain  in effect (meaning this test can be used) for the duration of the COVID-19 declaration under Section 564(b)(1) of the Act, 21 U.S.C.section 360bbb-3(b)(1), unless the authorization is terminated  or revoked sooner.       Influenza A by PCR 11/10/2022 NEGATIVE  NEGATIVE Final   Influenza B by PCR 11/10/2022 NEGATIVE  NEGATIVE Final   Comment: (NOTE) The Xpert Xpress SARS-CoV-2/FLU/RSV plus assay is intended as an aid in the diagnosis of influenza from Nasopharyngeal swab specimens and should not be used as a sole basis for treatment. Nasal washings and aspirates are unacceptable for Xpert Xpress SARS-CoV-2/FLU/RSV testing.  Fact Sheet for Patients: EntrepreneurPulse.com.au  Fact Sheet for Healthcare Providers: IncredibleEmployment.be  This test is not yet approved or cleared by the [place] FDA and has been authorized for detection and/or diagnosis of SARS-CoV-2 by FDA under an Emergency Use Authorization (EUA). This EUA will remain in effect (meaning this test can be used) for the duration of the COVID-19 declaration under Section 564(b)(1) of the Act, 21 U.S.C. section 360bbb-3(b)(1), unless the authorization is terminated or revoked.  Resp Syncytial Virus by PCR 11/10/2022 NEGATIVE  NEGATIVE Final   Comment: (NOTE) Fact Sheet for Patients: EntrepreneurPulse.com.au  Fact Sheet for Healthcare  Providers: IncredibleEmployment.be  This test is not yet approved or cleared by the [place] FDA and has been authorized for detection and/or diagnosis of SARS-CoV-2 by FDA under an Emergency Use Authorization (EUA). This EUA will remain in effect (meaning this test can be used) for the duration of the COVID-19 declaration under Section 564(b)(1) of the Act, 21 U.S.C. section 360bbb-3(b)(1), unless the authorization is terminated or revoked.  Performed at [facility], [place] [address] [place], [place] [address]    WBC 11/10/2022 6.5  4.0 - 10.5 K/uL Final   RBC 11/10/2022 5.38  4.22 - 5.81 MIL/uL Final   Hemoglobin 11/10/2022 14.1  13.0 - 17.0 g/dL Final   HCT 11/10/2022 42.1  39.0 - 52.0 % Final   MCV 11/10/2022 78.3 (L)  80.0 - 100.0 fL Final   MCH 11/10/2022 26.2  26.0 - 34.0 pg Final   MCHC 11/10/2022 33.5  30.0 - 36.0 g/dL Final   RDW 11/10/2022 17.2 (H)  11.5 - 15.5 % Final   Platelets 11/10/2022 201  150 - 400 K/uL Final   nRBC 11/10/2022 0.0  0.0 - 0.2 % Final   Neutrophils Relative % 11/10/2022 72  % Final   Neutro Abs 11/10/2022 4.6  1.7 - 7.7 K/uL Final   Lymphocytes Relative 11/10/2022 18  % Final   Lymphs Abs 11/10/2022 1.2  0.7 - 4.0 K/uL Final   Monocytes Relative 11/10/2022 9  % Final   Monocytes Absolute 11/10/2022 0.6  0.1 - 1.0 K/uL Final   Eosinophils Relative 11/10/2022 0  % Final   Eosinophils Absolute 11/10/2022 0.0  0.0 - 0.5 K/uL Final   Basophils Relative 11/10/2022 1  % Final   Basophils Absolute 11/10/2022 0.1  0.0 - 0.1 K/uL Final   Immature Granulocytes 11/10/2022 0  % Final   Abs Immature Granulocytes 11/10/2022 0.02  0.00 - 0.07 K/uL Final   Performed at [facility], [place] [address], [place], [place] [address]   Sodium 11/10/2022 132 (L)  135 - 145 mmol/L Final   Potassium 11/10/2022 4.7  3.5 - 5.1 mmol/L Final   Chloride 11/10/2022 91 (L)  98 - 111 mmol/L Final   CO2 11/10/2022 26  22 - 32 mmol/L Final    Glucose, Bld 11/10/2022 80  70 - 99 mg/dL Final   Glucose reference range applies only to samples taken after fasting for at least 8 hours.   BUN 11/10/2022 7  6 - 20 mg/dL Final   Creatinine, Ser 11/10/2022 0.83  0.61 - 1.24 mg/dL Final   Calcium 11/10/2022 9.0  8.9 - 10.3 mg/dL Final   Total Protein 11/10/2022 9.3 (H)  6.5 - 8.1 g/dL Final   Albumin 11/10/2022 4.4  3.5 - 5.0 g/dL Final   AST 11/10/2022 111 (H)  15 - 41 U/L Final   ALT 11/10/2022 83 (H)  0 - 44 U/L Final   Alkaline Phosphatase 11/10/2022 43  38 - 126 U/L Final   Total Bilirubin 11/10/2022 0.8  0.3 - 1.2 mg/dL Final   GFR, Estimated 11/10/2022 >60  >60 mL/min Final   Comment: (NOTE) Calculated using the CKD-EPI Creatinine Equation (2021)    Anion gap 11/10/2022 15  5 - 15 Final   Performed at [facility] [address], [place], [place] [address]   Magnesium 11/10/2022 2.2  1.7 - 2.4 mg/dL Final   Performed at [facility], [place] [address] [place], [place] [address]   Alcohol, Ethyl (B) 11/10/2022 347 (HH)  <10 mg/dL Final   Comment: CRITICAL RESULT CALLED TO, READ BACK BY AND VERIFIED WITH Mollie Germany, RN @ [phone] 11/10/22 BY SEKDAHL (NOTE) Lowest detectable limit for serum alcohol is 10 mg/dL.  For medical purposes only. Performed at [facility], [place] [address], [place], [place] [address]    TSH 11/10/2022 0.798  0.350 - 4.500 uIU/mL Final   Comment: Performed by a 3rd Generation assay with a functional sensitivity of <=0.01 uIU/mL. Performed at [facility], [place] [address], [place], [place] [address]    Color, Urine 11/10/2022 YELLOW  YELLOW Final   APPearance 11/10/2022 CLEAR  CLEAR Final   Specific Gravity, Urine 11/10/2022 1.010  1.005 - 1.030 Final   pH 11/10/2022 6.0  5.0 - 8.0 Final   Glucose, UA 11/10/2022 NEGATIVE  NEGATIVE mg/dL Final   Hgb urine dipstick 11/10/2022 SMALL (A)  NEGATIVE Final   Bilirubin Urine 11/10/2022 NEGATIVE  NEGATIVE Final   Ketones, ur 11/10/2022  NEGATIVE  NEGATIVE mg/dL Final   Protein, ur 11/10/2022 100 (A)  NEGATIVE mg/dL Final   Nitrite 11/10/2022 NEGATIVE  NEGATIVE Final   Leukocytes,Ua 11/10/2022 NEGATIVE  NEGATIVE Final   RBC / HPF 11/10/2022 0-5  0 - 5 RBC/hpf Final   WBC, UA 11/10/2022 0-5  0 - 5 WBC/hpf Final   Bacteria, UA 11/10/2022 NONE SEEN  NONE SEEN Final   Squamous Epithelial / LPF 11/10/2022 0-5  0 - 5 Final   Performed at [facility], [place] [address], [place], [place] [address]   POC Amphetamine UR 11/10/2022 None Detected  NONE DETECTED (Cut Off Level 1000 ng/mL) Final   POC Secobarbital (BAR) 11/10/2022 None Detected  NONE DETECTED (Cut Off Level 300 ng/mL) Final   POC Buprenorphine (BUP) 11/10/2022 None Detected  NONE DETECTED (Cut Off Level 10 ng/mL) Final   POC Oxazepam (BZO) 11/10/2022 None Detected  NONE DETECTED (Cut Off Level 300 ng/mL) Final   POC Cocaine UR 11/10/2022 None Detected  NONE DETECTED (Cut Off Level 300 ng/mL) Final   POC Methamphetamine UR 11/10/2022 None Detected  NONE DETECTED (Cut Off Level 1000 ng/mL) Final   POC Morphine 11/10/2022 None Detected  NONE DETECTED (Cut Off Level 300 ng/mL) Final   POC Methadone UR 11/10/2022 None Detected  NONE DETECTED (Cut Off Level 300 ng/mL) Final   POC Oxycodone UR 11/10/2022 None Detected  NONE DETECTED (Cut Off Level 100 ng/mL) Final   POC Marijuana UR 11/10/2022 None Detected  NONE DETECTED (Cut Off Level 50 ng/mL) Final   Troponin I (High Sensitivity) 11/10/2022 5  <18 ng/L Final   Comment: (NOTE) Elevated high sensitivity troponin I (hsTnI) values and significant  changes across serial measurements may suggest ACS but many other  chronic and acute conditions are known to elevate hsTnI results.  Refer to the "Links" section for chest pain algorithms and additional  guidance. Performed at [facility], [place] [address], [place],  [address]    SARSCOV2ONAVIRUS 2 AG 11/10/2022 NEGATIVE  NEGATIVE Final   Comment:  (NOTE) SARS-CoV-2 antigen NOT DETECTED.   Negative results are presumptive.  Negative results do not preclude SARS-CoV-2 infection and should not be used as the sole basis for treatment or other patient management decisions, including infection  control decisions, particularly in the presence of clinical signs and  symptoms consistent with COVID-19, or in those who have been in contact with the virus.  Negative results must be combined with clinical observations, patient history, and epidemiological information. The expected result is Negative.  Fact Sheet for Patients: HandmadeRecipes.com.cy  Fact Sheet for Healthcare Providers: FuneralLife.at  This test is not yet approved or cleared by the [place] FDA and  has been authorized for detection and/or diagnosis of SARS-CoV-2 by FDA under an Emergency Use Authorization (EUA).  This EUA will remain in effect (meaning this test can be used) for the duration of  the COV                          ID-19 declaration under Section 564(b)(1) of the Act, 21 U.S.C. section 360bbb-3(b)(1), unless the authorization is terminated or revoked sooner.     Cholesterol 11/10/2022 314 (H)  0 - 200 mg/dL Final   Triglycerides 11/10/2022 109  <150 mg/dL Final   HDL 11/10/2022 >135  >40 mg/dL Final   Total CHOL/HDL Ratio 11/10/2022 NOT CALCULATED  RATIO Final   VLDL 11/10/2022 22  0 - 40 mg/dL Final   LDL Cholesterol 11/10/2022 NOT CALCULATED  0 - 99 mg/dL Final   Performed at [facility], [place] [address] [place], [place] [address]    Blood Alcohol level:  Lab Results  Component Value Date   ETH 161 (H) 11/10/2022   ETH 347 (HH) 73/71/0626    Metabolic Disorder Labs: Lab Results  Component Value Date   HGBA1C 5.6 03/27/2022   MPG 114.02 03/27/2022   MPG 114.02 12/27/2021   No results found for: "PROLACTIN" Lab Results  Component Value Date   CHOL 314 (H) 11/10/2022   TRIG 109  11/10/2022   HDL >135 11/10/2022   CHOLHDL NOT CALCULATED 11/10/2022   VLDL 22 11/10/2022   LDLCALC NOT CALCULATED 11/10/2022   LDLCALC 142 (H) 04/13/2022    Therapeutic Lab Levels: No results found for: "LITHIUM" No results found for: "VALPROATE" No results found for: "CBMZ"  Physical Findings   AIMS    Flowsheet Row Admission (Discharged) from 03/16/2021 in [facility] 400B Admission (Discharged) from 05/04/2018 in [facility] 300B Admission (Discharged) from 06/29/2017 in [facility] 300B  AIMS Total Score 0 0 0      AUDIT    Flowsheet Row Admission (Discharged) from 03/16/2021 in [facility] 400B Admission (Discharged) from 05/04/2018 in [facility] 300B Admission (Discharged) from 06/29/2017 in [facility] 300B Admission (Discharged) from 09/06/2014 in [facility] Admission (Discharged) from 06/01/2014 in [facility] 300B  Alcohol Use Disorder Identification Test Final Score (AUDIT) 40 34 35 26 [place] Office Visit from 05/31/2022 in [facility] 1 Office Visit from 04/13/2022 in [facility] Office Visit from 12/02/2021 in [facility] Office Visit from 10/10/2021 in [facility] Office Visit from 08/18/2021 in [facility] 1  Total GAD-7 Score 7 0 '13 13 11      '$ PHQ2-9    Flowsheet Row ED from 11/11/2022 in [facility] ED from 11/10/2022 in [facility] Office Visit from 05/31/2022 in [place] [facility] Office Visit from 04/13/2022 in [facility] ED from 03/27/2022 in [facility]  PHQ-2 Total Score '3 6 1 1 1  '$ PHQ-9 Total Score '15 20 5  '$ -- 7      Flowsheet Row ED from 11/11/2022 in [facility] Most recent reading at 11/11/2022 12:04 PM ED from 11/10/2022 in [place] [facility] Most recent reading at 11/10/2022 10:10 PM ED from 11/10/2022 in [facility] Most recent reading at 11/10/2022  2:33 PM  C-SSRS RISK CATEGORY No Risk No Risk No Risk        Musculoskeletal  Strength & Muscle Tone: decreased Gait & Station: shuffle Patient leans: N/A  Psychiatric Specialty Exam  Presentation  General Appearance:  Appropriate for Environment; Casual  Eye Contact: Good  Speech: Clear and Coherent  Speech Volume: Normal  Handedness: Right   Mood and Affect  Mood: Dysphoric; Anxious  Affect: Appropriate   Thought Process  Thought Processes: Goal Directed  Descriptions of Associations:Circumstantial  Orientation:Full (Time, Place and Person)  Thought Content:Logical  Diagnosis of Schizophrenia or Schizoaffective disorder in past: No    Hallucinations:Hallucinations: Visual Description of Visual Hallucinations: " I see these knats, and I sawt at them. It started when I came here."  Ideas of Reference:None  Suicidal Thoughts:Suicidal Thoughts: No  Homicidal Thoughts:Homicidal Thoughts: No   Sensorium  Memory: Immediate Fair; Recent Fair  Judgment: Good  Insight: Good   Executive Functions  Concentration: Fair  Attention Span: Fair  Recall: Poor (0/3)  Fund of Knowledge: Fair  Language: Fair   Psychomotor Activity  Psychomotor Activity: Psychomotor Activity: Decreased   Assets  Assets: Desire for Improvement; Communication Skills; Resilience; Social Support   Sleep  Sleep: Sleep: Good   No data recorded  Physical Exam  Physical Exam HENT:     Head: Normocephalic and atraumatic.  Pulmonary:     Effort: Pulmonary effort is normal.  Musculoskeletal:        General: Swelling  and tenderness present. No signs of injury.     Comments: Swelling, warmth appreciated around L knee in comparison to R knee. Pain elicited with passive ROM of LLE (flexion of knee and abduction > adduction of hip). No bruising, erythema, or signs of trauma appreciated.   Skin:    General: Skin is warm.  Neurological:     Mental Status: He is alert and oriented to person, place, and time.     Comments: Tremulousness. Generalized weakness of all 4 extremities and bilateral shoulders.  Facial symmetry, no drooping appreciated. Symmetric smile.  Some mild aphasia noted.     Review of Systems  Psychiatric/Behavioral:  Positive for depression. Negative for hallucinations and suicidal ideas. The patient is nervous/anxious. The patient does not have insomnia.    Blood pressure (!) 126/91, pulse 79, temperature 98.3 F (36.8 C), temperature source Tympanic, resp. rate 18, SpO2 100 %. There is no height or weight on file to calculate BMI.  Treatment Plan Summary: Daily contact with patient to assess and evaluate symptoms and progress in treatment and Medication management Due to patient's physical symptoms including gait instability, L knee swelling and warmth, and LLE pain, recommend patient to go to [facility] for evaluation.   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 VSS, CBC without leukocytosis  Hx  sarcoidosis - EKG: NSR QTc 426 - Previous concern by outpatient provider the patient's chest pain may be more related to his anxiety   PRN -Tylenol '650mg'$  q6h, pain -Maalox 72m q4h, indigestion -Atarax '25mg'$  TID, anxiety -Milk of Mag 313m constipation as needed -Zofran 4 mg every 6 hours - Loperamide capsule 2-4 mg as needed for 72 hours - Multivitamin    CoRosezetta SchlatterMD PGY-2 11/13/2022 8:02 AM

## 2022-11-14 ENCOUNTER — Emergency Department (HOSPITAL_COMMUNITY): Payer: Self-pay

## 2022-11-14 ENCOUNTER — Emergency Department (HOSPITAL_BASED_OUTPATIENT_CLINIC_OR_DEPARTMENT_OTHER): Payer: Self-pay

## 2022-11-14 DIAGNOSIS — M7989 Other specified soft tissue disorders: Secondary | ICD-10-CM

## 2022-11-14 DIAGNOSIS — M79605 Pain in left leg: Secondary | ICD-10-CM

## 2022-11-14 LAB — SARS CORONAVIRUS 2 BY RT PCR: SARS Coronavirus 2 by RT PCR: NEGATIVE

## 2022-11-14 MED ORDER — LORAZEPAM 1 MG PO TABS
1.0000 mg | ORAL_TABLET | Freq: Once | ORAL | Status: AC
  Administered 2022-11-14: 1 mg via ORAL
  Filled 2022-11-14: qty 1

## 2022-11-14 NOTE — ED Provider Notes (Signed)
[facility] EMERGENCY DEPARTMENT Provider Note   CSN: [id number] Arrival date & time: 11/13/22  1158     History  Chief Complaint  Patient presents with   Leg Pain    [name] [name] is a 61 y.o. male with a past medical history significant for alcohol abuse, anxiety, sarcoidosis, GERD, chronic bilateral knee pain, hypertension, prediabetes, and hyperlipidemia who presents to the ED due to acute on chronic low back pain.  Patient states for the past 4 days he has had left lower extremity weakness.  He states he feels "off balance" ambulating.  He admits to a remote low back injury roughly 2 to 3 years ago.  No injury since.  Denies saddle anesthesia and bowel/bladder incontinence.  Patient has a history of peripheral neuropathy and admits to some numbness/tingling to bilateral lower extremities worse on the left compared to the right.  Denies fever and chills.  No IV drug use.  Patient also endorses left knee pain and edema that has been present for the past few months.  No history of gout.  He notes he has been told he needs bilateral knee replacements.  Patient was sent from [facility] to rule out septic joint.  No fever or chills.  Denies history of blood clots.  History obtained from patient and past medical records. No interpreter used during encounter.       Home Medications Prior to Admission medications   Medication Sig Start Date End Date Taking? Authorizing Provider  chlordiazePOXIDE (LIBRIUM) 25 MG capsule Take 1 capsule (25 mg total) by mouth 3 (three) times daily. 11/13/22   Rosezetta Schlatter, MD  chlordiazePOXIDE (LIBRIUM) 25 MG capsule Take 1 capsule (25 mg total) by mouth 2 (two) times daily. 11/14/22   Rosezetta Schlatter, MD  chlordiazePOXIDE (LIBRIUM) 25 MG capsule Take 1 capsule (25 mg total) by mouth once for 1 dose. 11/15/22 11/15/22  Rosezetta Schlatter, MD  escitalopram (LEXAPRO) 10 MG tablet Take 1 tablet (10 mg total) by mouth daily. 11/14/22 12/14/22  Rosezetta Schlatter, MD  gabapentin (NEURONTIN) 100 MG capsule Take 2 capsules (200 mg total) by mouth 3 (three) times daily. 04/13/22   Argentina Donovan, PA-C  hydrOXYzine (ATARAX) 25 MG tablet Take 1 tablet (25 mg total) by mouth every 6 (six) hours as needed for anxiety (or CIWA score </= 10). 11/13/22   Rosezetta Schlatter, MD  traZODone (DESYREL) 50 MG tablet Take 1 tablet (50 mg total) by mouth at bedtime as needed for sleep. 11/13/22   Rosezetta Schlatter, MD      Allergies    Oxycodone    Review of Systems   Review of Systems  Constitutional:  Negative for chills and fever.  Respiratory:  Negative for shortness of breath.   Cardiovascular:  Negative for chest pain.  Gastrointestinal:  Negative for abdominal pain.  Musculoskeletal:  Positive for arthralgias, back pain, gait problem and joint swelling.  Neurological:  Positive for weakness and numbness.  All other systems reviewed and are negative.   Physical Exam Updated Vital Signs BP (!) 149/98   Pulse 78   Temp 97.6 F (36.4 C)   Resp (!) 21   SpO2 98%  Physical Exam Vitals and nursing note reviewed.  Constitutional:      General: He is not in acute distress.    Appearance: He is not ill-appearing.  HENT:     Head: Normocephalic.  Eyes:     Pupils: Pupils are equal, round, and reactive to light.  Cardiovascular:  Rate and Rhythm: Normal rate and regular rhythm.     Pulses: Normal pulses.     Heart sounds: Normal heart sounds. No murmur heard.    No friction rub. No gallop.  Pulmonary:     Effort: Pulmonary effort is normal.     Breath sounds: Normal breath sounds.  Abdominal:     General: Abdomen is flat. There is no distension.     Palpations: Abdomen is soft.     Tenderness: There is no abdominal tenderness. There is no guarding or rebound.  Musculoskeletal:        General: Normal range of motion.     Cervical back: Neck supple.     Comments: No thoracic or lumbar midline tenderness. Decreased strength of LLE. Mild  edema and warmth to left knee. Able to slightly flex and extend left knee. Pedal pulses palpable. Soft compartments.   Skin:    General: Skin is warm and dry.  Neurological:     General: No focal deficit present.     Mental Status: He is alert.  Psychiatric:        Mood and Affect: Mood normal.        Behavior: Behavior normal.     ED Results / Procedures / Treatments   Labs (all labs ordered are listed, but only abnormal results are displayed) Labs Reviewed  COMPREHENSIVE METABOLIC PANEL - Abnormal; Notable for the following components:      Result Value   Sodium 134 (*)    Glucose, Bld 102 (*)    Alkaline Phosphatase 36 (*)    All other components within normal limits  CBC WITH DIFFERENTIAL/PLATELET - Abnormal; Notable for the following components:   Hemoglobin 11.8 (*)    HCT 36.4 (*)    RDW 17.3 (*)    All other components within normal limits    EKG EKG Interpretation  Date/Time:  Tuesday November 14 2022 09:41:55 EST Ventricular Rate:  74 PR Interval:  134 QRS Duration: 92 QT Interval:  366 QTC Calculation: 406 R Axis:   74 Text Interpretation: Sinus rhythm with occasional Premature ventricular complexes Minimal voltage criteria for LVH, may be normal variant ( Sokolow-Lyon ) Septal infarct , age undetermined Abnormal ECG When compared with ECG of 10-Nov-2022 21:35, PREVIOUS ECG IS PRESENT when compared to prior, less sharp t waves. No STEMI Confirmed by Antony Blackbird [phone]) on 11/14/2022 12:05:59 PM  Radiology VAS [place] LOWER EXTREMITY VENOUS (DVT) (7a-7p)  Result Date: 11/14/2022  Lower Venous DVT Study Patient Name:  ISAO SELTZER  Date of Exam:   11/14/2022 Medical Rec #: [id number]    Accession #:    [id number] Date of Birth: 09/10/1961    Patient Gender: M Patient Age:   70 years Exam Location:  [facility] Procedure:      VAS [place] LOWER EXTREMITY VENOUS (DVT) Referring Phys: Charmaine Downs  --------------------------------------------------------------------------------  Indications: Left leg pain, knee swelling.  Comparison Study: 11-14-2022 X-ray of left knee Performing Technologist: Darlin Coco RDMS, RVT  Examination Guidelines: A complete evaluation includes B-mode imaging, spectral Doppler, color Doppler, and power Doppler as needed of all accessible portions of each vessel. Bilateral testing is considered an integral part of a complete examination. Limited examinations for reoccurring indications may be performed as noted. The reflux portion of the exam is performed with the patient in reverse Trendelenburg.  +-----+---------------+---------+-----------+----------+--------------+ RIGHTCompressibilityPhasicitySpontaneityPropertiesThrombus Aging +-----+---------------+---------+-----------+----------+--------------+ CFV  Full           Yes  Yes                                 +-----+---------------+---------+-----------+----------+--------------+   +---------+---------------+---------+-----------+----------+--------------+ LEFT     CompressibilityPhasicitySpontaneityPropertiesThrombus Aging +---------+---------------+---------+-----------+----------+--------------+ CFV      Full           Yes      Yes                                 +---------+---------------+---------+-----------+----------+--------------+ SFJ      Full                                                        +---------+---------------+---------+-----------+----------+--------------+ FV Prox  Full                                                        +---------+---------------+---------+-----------+----------+--------------+ FV Mid   Full                                                        +---------+---------------+---------+-----------+----------+--------------+ FV DistalFull                                                         +---------+---------------+---------+-----------+----------+--------------+ PFV      Full                                                        +---------+---------------+---------+-----------+----------+--------------+ POP      Full           Yes      Yes                                 +---------+---------------+---------+-----------+----------+--------------+ PTV      Full                                                        +---------+---------------+---------+-----------+----------+--------------+ PERO     Full                                                        +---------+---------------+---------+-----------+----------+--------------+ Gastroc  Full                                                        +---------+---------------+---------+-----------+----------+--------------+  Summary: RIGHT: - No evidence of common femoral vein obstruction.  LEFT: - There is no evidence of deep vein thrombosis in the lower extremity.  - A cystic structure is found in the popliteal fossa with internal echoes measuring >4 cm.  *See table(s) above for measurements and observations.    Preliminary    DG Knee Complete 4 Views Left  Result Date: 11/14/2022 CLINICAL DATA:  Edema.  Left knee pain and swelling. EXAM: LEFT KNEE - COMPLETE 4+ VIEW COMPARISON:  Left knee radiographs 05/20/2022 FINDINGS: No acute fracture or dislocation is identified. There is a new moderate-sized knee joint effusion. 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 There is progressive, severe medial compartment joint space narrowing with associated subchondral sclerosis, subchondral lucencies/cyst formation, and marginal osteophytosis. IMPRESSION: 1. New moderate knee joint effusion.  No acute osseous abnormality. 2. Progressive, severe medial compartment osteoarthrosis. Electronically Signed   By: Logan Bores M.D.   On: 11/14/2022 13:32   DG Lumbar Spine 2-3 Views  Result Date: 11/13/2022 CLINICAL DATA:   989211 Left low back pain [id number] EXAM: LUMBAR SPINE - 2-3 VIEW COMPARISON:  None Available. FINDINGS: There are five non-rib bearing lumbar-type vertebral bodies. There is mild straightening of the lumbar lordosis without spondylolisthesis. There is no evidence for acute fracture or subluxation. Mild intervertebral disc space height loss at L4-5 and L5-S1. Mild multilevel endplate proliferative changes. Lower lumbar facet arthropathy. Visualized abdomen is unremarkable. IMPRESSION: Mild degenerative changes of the lower lumbar spine. Electronically Signed   By: Valentino Saxon M.D.   On: 11/13/2022 14:51    Procedures Procedures    Medications Ordered in ED Medications  ibuprofen (ADVIL) tablet 600 mg (600 mg Oral Given 11/13/22 1318)  LORazepam (ATIVAN) tablet 1 mg (1 mg Oral Given 11/14/22 1501)    ED Course/ Medical Decision Making/ A&P                           Medical Decision Making Amount and/or Complexity of Data Reviewed Labs: ordered. Decision-making details documented in ED Course. Radiology: ordered and independent interpretation performed. Decision-making details documented in ED Course.  Risk Prescription drug management.   This patient presents to the ED for concern of low back pain, knee pain, this involves an extensive number of treatment options, and is a complaint that carries with it a high risk of complications and morbidity.  The differential diagnosis includes cauda equina, central cord compression, septic joint, gout, etc  61 year old male presents to the ED due to acute on chronic low back pain associated with left lower extremity weakness that has worsened over the past 4 days.  He also admits to left knee pain and edema for few months.  Sent by [facility] to rule out septic joint.  No fever or chills.  No history of IV drug use.  Denies history of gout.  Upon arrival, vitals all within normal limits.  Patient in no acute distress.  Unfortunately patient waited over  23 hours prior to my initial evaluation due to long wait times.  Physical exam significant for left lower extremity weakness compared to right.  No thoracic or lumbar midline tenderness.  Mild edema and warmth to left knee.  Patient able to flex and extend left knee with some pain.  Patient appeared off balance with ambulation.  Given new lower extremity weakness will obtain MRI lumbar spine to rule out cauda equina or central cord compression. MRI brain given new balance issues to  rule out CVA. [place] to rule out DVT given edema and pain to LLE. Discussed with Dr. Sherry Ruffing who evaluated patient at bedside and agrees with assessment and plan.   CBC without leukocytosis.  Mild anemia with hemoglobin 11.8.  CMP significant for hyponatremia 134.  Normal renal function.  LFTs normal.  Ultrasound negative for DVT however does demonstrate a cystic structure in left popliteal fossa.  X-ray personally reviewed and interpreted which demonstrates new moderate knee joint effusion.  Does also demonstrate severe medial compartment OA. Given patient's edema has been present for numerous months, no documented fever, and normal WBC septic joint thought to be less likely; however offered to tap left knee to rule out infection which patient declined. Knee placed in knee sleeve and will give orthopedic follow-up at discharge.    Patient handed off to Dorise Bullion, PA-C at shift change pending MRI and reassessment        Final Clinical Impression(s) / ED Diagnoses Final diagnoses:  Acute left-sided low back pain with left-sided sciatica  Acute pain of left knee    Rx / DC Orders ED Discharge Orders     None         Karie Kirks 11/14/22 1529    Tegeler, Gwenyth Allegra, MD 11/17/22 [phone]

## 2022-11-14 NOTE — ED Notes (Signed)
Safe transport called, be here in 15 mins

## 2022-11-14 NOTE — ED Notes (Signed)
MRI sending for pt now

## 2022-11-14 NOTE — ED Notes (Signed)
Pt wanting to leave, EDPA in to see. MRI transit enroute.

## 2022-11-14 NOTE — ED Notes (Signed)
Patient vitas to be updated upon arrival from MRI

## 2022-11-14 NOTE — ED Notes (Signed)
Back from MRI, sleeping, NAD, calm.

## 2022-11-14 NOTE — ED Notes (Signed)
Back from xray, transport here to take to vasc lab, pending MRI after vasc studies. No changes. Pt alert, NAD, calm, interactive, given water per request.

## 2022-11-14 NOTE — ED Notes (Signed)
To MRI

## 2022-11-14 NOTE — ED Notes (Addendum)
EDPA finished at [facility]. Pt alert, NAD, calm, interactive. C/o L knee pain, swelling noted, pain primarily with movement, able to bend 90 degrees. Denies other sx at this time. Last ETOH 6 days ago. Denies withdrawal sx since being on [place] meds. Mentions some low back pain.

## 2022-11-14 NOTE — Progress Notes (Signed)
Orthopedic Tech Progress Note Patient Details:  [name] [name] 11/27/1961 [id number]  Ortho Devices Type of Ortho Device: Knee Sleeve Ortho Device/Splint Location: LLE Ortho Device/Splint Interventions: Ordered, Application, Adjustment, Removal   Post Interventions Patient Tolerated: Fair, Well Instructions Provided: Care of device  Janit Pagan 11/14/2022, 4:49 PM

## 2022-11-14 NOTE — Discharge Instructions (Addendum)
You were evaluated in the emergency department for left knee pain and leg weakness.  MRI imaging did not show acute stroke or spinal cord compression.  Imaging did not show any acute findings.  Xray imaging of the knee did not show any fracture or dislocation, however did note a small cyst.  Please follow up with orthopedics in the next 2-3 days for re-evaluation.  You have been provided the contact information to call and schedule.  Return to the ED for new or worsening symptoms as discussed.

## 2022-11-14 NOTE — ED Notes (Signed)
Walking in h/w, redirected to room.

## 2022-11-14 NOTE — ED Provider Notes (Signed)
Physical Exam  BP (!) 149/98   Pulse 78   Temp 97.6 F (36.4 C)   Resp (!) 21   SpO2 98%   Physical Exam Vitals and nursing note reviewed.  Constitutional:      General: He is not in acute distress.    Appearance: He is not toxic-appearing.     Comments: Resting comfortably  HENT:     Head: Normocephalic and atraumatic.  Cardiovascular:     Rate and Rhythm: Normal rate.  Pulmonary:     Effort: Pulmonary effort is normal. No respiratory distress.  Musculoskeletal:     Cervical back: Neck supple.  Skin:    General: Skin is warm and dry.     Coloration: Skin is not jaundiced or pale.    Procedures  Procedures  ED Course / MDM    Medical Decision Making Amount and/or Complexity of Data Reviewed Radiology: ordered.  Risk Prescription drug management.   Patient signed out to me at shift change.  Please see previous provider note for further details.  In short, this is a 61 yom with chief complaint of low back pain, LLE weakness, and left knee swelling.  Hx of chronic low back pain, though left lower extremity weakness new.  Weakness started 4 days ago.  Somewhat poor ambulation.  Neuro exam unremarkable except for lower leg weakness.  -- MRI of lumbar and brain pending.    Left knee has been edematous for months per pt.  No hx of gout.  Does not appear to be septic joint based on labs, history, and imaging.  No DVT, X-ray negative.  Hold off on knee tap.  Plan for knee sleeve and ortho follow up regarding this issue.    Recently seen at [facility] due to alcohol concerns.  Received ativan to help with this.  Minimal tremors if any.  Plan: if MRI unremarkable, can go home with ortho follow up.  If concerning, likely consult neurosurgery.  Results for orders placed or performed during the hospital encounter of 11/13/22  Comprehensive metabolic panel  Result Value Ref Range   Sodium 134 (L) 135 - 145 mmol/L   Potassium 4.0 3.5 - 5.1 mmol/L   Chloride 98 98 - 111 mmol/L    CO2 29 22 - 32 mmol/L   Glucose, Bld 102 (H) 70 - 99 mg/dL   BUN 12 6 - 20 mg/dL   Creatinine, Ser 1.04 0.61 - 1.24 mg/dL   Calcium 9.1 8.9 - 10.3 mg/dL   Total Protein 7.7 6.5 - 8.1 g/dL   Albumin 3.6 3.5 - 5.0 g/dL   AST 36 15 - 41 U/L   ALT 39 0 - 44 U/L   Alkaline Phosphatase 36 (L) 38 - 126 U/L   Total Bilirubin 1.0 0.3 - 1.2 mg/dL   GFR, Estimated >60 >60 mL/min   Anion gap 7 5 - 15  CBC with Differential  Result Value Ref Range   WBC 7.2 4.0 - 10.5 K/uL   RBC 4.39 4.22 - 5.81 MIL/uL   Hemoglobin 11.8 (L) 13.0 - 17.0 g/dL   HCT 36.4 (L) 39.0 - 52.0 %   MCV 82.9 80.0 - 100.0 fL   MCH 26.9 26.0 - 34.0 pg   MCHC 32.4 30.0 - 36.0 g/dL   RDW 17.3 (H) 11.5 - 15.5 %   Platelets 180 150 - 400 K/uL   nRBC 0.0 0.0 - 0.2 %   Neutrophils Relative % 64 %   Neutro Abs 4.8 1.7 -  7.7 K/uL   Lymphocytes Relative 20 %   Lymphs Abs 1.4 0.7 - 4.0 K/uL   Monocytes Relative 13 %   Monocytes Absolute 0.9 0.1 - 1.0 K/uL   Eosinophils Relative 1 %   Eosinophils Absolute 0.1 0.0 - 0.5 K/uL   Basophils Relative 1 %   Basophils Absolute 0.0 0.0 - 0.1 K/uL   Immature Granulocytes 1 %   Abs Immature Granulocytes 0.04 0.00 - 0.07 K/uL   VAS [place] LOWER EXTREMITY VENOUS (DVT) (7a-7p)  Result Date: 11/14/2022  Lower Venous DVT Study Patient Name:  DAIMIAN SUDBERRY  Date of Exam:   11/14/2022 Medical Rec #: [id number]    Accession #:    [id number] Date of Birth: Feb 11, 1961    Patient Gender: M Patient Age:   61 years Exam Location:  [facility] Procedure:      VAS [place] LOWER EXTREMITY VENOUS (DVT) Referring Phys: Charmaine Downs --------------------------------------------------------------------------------  Indications: Left leg pain, knee swelling.  Comparison Study: 11-14-2022 X-ray of left knee Performing Technologist: Darlin Coco RDMS, RVT  Examination Guidelines: A complete evaluation includes B-mode imaging, spectral Doppler, color Doppler, and power Doppler as needed of all accessible portions of  each vessel. Bilateral testing is considered an integral part of a complete examination. Limited examinations for reoccurring indications may be performed as noted. The reflux portion of the exam is performed with the patient in reverse Trendelenburg.  +-----+---------------+---------+-----------+----------+--------------+ RIGHTCompressibilityPhasicitySpontaneityPropertiesThrombus Aging +-----+---------------+---------+-----------+----------+--------------+ CFV  Full           Yes      Yes                                 +-----+---------------+---------+-----------+----------+--------------+   +---------+---------------+---------+-----------+----------+--------------+ LEFT     CompressibilityPhasicitySpontaneityPropertiesThrombus Aging +---------+---------------+---------+-----------+----------+--------------+ CFV      Full           Yes      Yes                                 +---------+---------------+---------+-----------+----------+--------------+ SFJ      Full                                                        +---------+---------------+---------+-----------+----------+--------------+ FV Prox  Full                                                        +---------+---------------+---------+-----------+----------+--------------+ FV Mid   Full                                                        +---------+---------------+---------+-----------+----------+--------------+ FV DistalFull                                                        +---------+---------------+---------+-----------+----------+--------------+  PFV      Full                                                        +---------+---------------+---------+-----------+----------+--------------+ POP      Full           Yes      Yes                                 +---------+---------------+---------+-----------+----------+--------------+ PTV      Full                                                         +---------+---------------+---------+-----------+----------+--------------+ PERO     Full                                                        +---------+---------------+---------+-----------+----------+--------------+ Gastroc  Full                                                        +---------+---------------+---------+-----------+----------+--------------+     Summary: RIGHT: - No evidence of common femoral vein obstruction.  LEFT: - There is no evidence of deep vein thrombosis in the lower extremity.  - A cystic structure is found in the popliteal fossa with internal echoes measuring >4 cm.  *See table(s) above for measurements and observations.    Preliminary    DG Knee Complete 4 Views Left  Result Date: 11/14/2022 CLINICAL DATA:  Edema.  Left knee pain and swelling. EXAM: LEFT KNEE - COMPLETE 4+ VIEW COMPARISON:  Left knee radiographs 05/20/2022 FINDINGS: No acute fracture or dislocation is identified. There is a new moderate-sized knee joint effusion. 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 There is progressive, severe medial compartment joint space narrowing with associated subchondral sclerosis, subchondral lucencies/cyst formation, and marginal osteophytosis. IMPRESSION: 1. New moderate knee joint effusion.  No acute osseous abnormality. 2. Progressive, severe medial compartment osteoarthrosis. Electronically Signed   By: Logan Bores M.D.   On: 11/14/2022 13:32   DG Lumbar Spine 2-3 Views  Result Date: 11/13/2022 CLINICAL DATA:  [id number] Left low back pain [id number] EXAM: LUMBAR SPINE - 2-3 VIEW COMPARISON:  None Available. FINDINGS: There are five non-rib bearing lumbar-type vertebral bodies. There is mild straightening of the lumbar lordosis without spondylolisthesis. There is no evidence for acute fracture or subluxation. Mild intervertebral disc space height loss at L4-5 and L5-S1. Mild multilevel endplate proliferative changes. Lower lumbar facet  arthropathy. Visualized abdomen is unremarkable. IMPRESSION: Mild degenerative changes of the lower lumbar spine. Electronically Signed   By: Valentino Saxon M.D.   On: 11/13/2022 14:51   DG Chest Port 1 View  Result Date: 11/10/2022 CLINICAL DATA:  Weakness and diaphoresis. EXAM: PORTABLE CHEST 1 VIEW COMPARISON:  PA Lat 02/27/2018 FINDINGS: The cardiomediastinal silhouette and vascular pattern are normal. There are calcified mediastinal and right hilar lymph nodes. Lungs are mildly emphysematous with chronic biapical fibrosis and cephalad hilar retraction on the right greater than left. No focal pneumonia is evident. No pleural effusion is seen. The thoracic cage intact. Slight lower thoracic levoscoliosis. IMPRESSION: No evidence of acute chest disease. COPD with chronic biapical fibrosis and cephalad hilar retraction on the right greater than left. Multiple calcified lymph nodes. Sarcoid lung changes radiographically unaltered. Electronically Signed   By: Telford Nab M.D.   On: 11/10/2022 22:25     Workup pertinent findings: -- -- -- --  Medications: -- --  Consultations -- --  Plan --  I discussed this case with my attending physician ***, who agreed with the proposed treatment course and cosigned this note including patient's presenting symptoms, physical exam, and planned diagnostics and interventions.  Attending physician stated agreement with plan or made changes to plan which were implemented.     This chart was dictated using voice recognition software.  Despite best efforts to proofread, errors can occur which can change the documentation meaning.

## 2022-11-14 NOTE — ED Notes (Signed)
Ortho tech at BS 

## 2022-11-15 ENCOUNTER — Ambulatory Visit (HOSPITAL_COMMUNITY)
Admission: EM | Admit: 2022-11-15 | Discharge: 2022-11-15 | Disposition: A | Discharge status: Home / Self Care | Payer: No Payment, Other | Attending: Psychiatry | Admitting: Psychiatry

## 2022-11-15 DIAGNOSIS — D869 Sarcoidosis, unspecified: Secondary | ICD-10-CM | POA: Insufficient documentation

## 2022-11-15 DIAGNOSIS — F1010 Alcohol abuse, uncomplicated: Secondary | ICD-10-CM

## 2022-11-15 DIAGNOSIS — F10239 Alcohol dependence with withdrawal, unspecified: Secondary | ICD-10-CM | POA: Insufficient documentation

## 2022-11-15 DIAGNOSIS — R0789 Other chest pain: Secondary | ICD-10-CM | POA: Insufficient documentation

## 2022-11-15 DIAGNOSIS — F329 Major depressive disorder, single episode, unspecified: Secondary | ICD-10-CM | POA: Insufficient documentation

## 2022-11-15 DIAGNOSIS — Z5900 Homelessness unspecified: Secondary | ICD-10-CM | POA: Insufficient documentation

## 2022-11-15 LAB — VITAMIN B1: Vitamin B1 (Thiamine): 79.2 nmol/L (ref 66.5–200.0)

## 2022-11-15 NOTE — ED Provider Notes (Signed)
Behavioral Health Urgent Care Medical Screening Exam  Patient Name: [name] [name] MRN: [id number] Date of Evaluation: 11/15/22 Chief Complaint: "I am tired of living from street to street, I need to stay sober" Diagnosis:  Final diagnoses:  Alcohol abuse  Alcohol dependence with withdrawal with complication ([place])    History of Present illness: [name] [name] is a 61 y.o. male with history of major depressive disorder, anxiety, alcohol use disorder, homelessness, who was dropped to [facility] by safe transport from the ED for detox monitoring.  On evaluation patient is alert and oriented x3, speech is clear and coherent. Patient's eye contact is good, mood is euthymic and affect is appropriate. Patient's thought process is goal directed and thought content is within normal limits. Patient denies SI HI, denies AVH, or paranoia. here is no indication that the patient is responding to internal stimuli and no delusion noted.    Upon chart review, patient presented to [facility] 11/10/2022 for detox from alcohol, he reported drinking 3 of 40 ounces of beer every day for the past 3 months. Patient was assessed by provider and admitted to [facility] for detox from alcohol.  On assessment, patient's  alcohol ethyl was >347 and he was also complaining of chest pain. Patient was transferred to [facility] to be medically cleared.  During assessment patient also endorses history of sarcoidosis which he did not follow-up with primary care along with complains of daily chest tightness.  Patient was seen at the ED and medically cleared 11/14/22. Patient was safely transported back to [facility] 11/14/22  On evaluation by this provider, patient appeared to be calm, no tremor, no distress and no signs of alcohol withdrawal observed or reported by patient. Patient explained to this provider that he is tired of being homeless and living from streets to street.  He says that he applied for disability and waiting for response. Patient added that he  could not wok this year due to his medical and mental health issues which contributed more to his homelessness. Patient expressed his desire to stay sober, and focus on his goals one day at a time.  Patient reported staying with his uncle who has dementia prior to admission on 11/10/22, uncle lives at [place]. [name] [name] says that his uncle can be violent sometimes due to his dementia. Patient denies having gun at home, he also denies having access to firearm.  Patient contracted for safety.   Support, encouragement and reassurance provided about ongoing stressors and patient provided with opportunity for questions.    Patient does not meet inpatient psychiatry admission criteria and there is no evidence of imminent danger to self or others.     Patient says that he will go back to his uncle's house in [place], [place]. Taxi was called to transport patient safely.   [name] ED from 11/13/2022 in [place] ED from 11/11/2022 in [facility] ED from 11/10/2022 in [place] DEPT  C-SSRS RISK CATEGORY No Risk No Risk No Risk       Psychiatric Specialty Exam  Presentation  General Appearance:Appropriate for Environment  Eye Contact:Good  Speech:Clear and Coherent  Speech Volume:Normal  Handedness:Right   Mood and Affect  Mood: Euthymic  Affect: Appropriate   Thought Process  Thought Processes: Coherent  Descriptions of Associations:Intact  Orientation:Full (Time, Place and Person)  Thought Content:WDL  Diagnosis of Schizophrenia or Schizoaffective disorder in past: No   Hallucinations:None " I see these knats, and I  sawt at them. It started when I came here."  Ideas of Reference:None  Suicidal Thoughts:No  Homicidal Thoughts:No   Sensorium  Memory: Immediate Good; Recent Good; Remote Good  Judgment: Good  Insight: Good   Executive  Functions  Concentration: Good  Attention Span: Good  Recall: Poor (0/3)  Fund of Knowledge: Good  Language: Good   Psychomotor Activity  Psychomotor Activity: Normal   Assets  Assets: Resilience; Communication Skills; Social Support; Desire for Improvement; Physical Health   Sleep  Sleep: Good  Number of hours: No data recorded  No data recorded  Physical Exam: Physical Exam Vitals and nursing note reviewed.  Constitutional:      Appearance: Normal appearance.  HENT:     Nose: No congestion.  Eyes:     General:        Right eye: No discharge.        Left eye: No discharge.  Cardiovascular:     Rate and Rhythm: Normal rate.     Pulses: Normal pulses.  Pulmonary:     Effort: No respiratory distress.     Breath sounds: No wheezing.  Neurological:     Mental Status: He is alert.     Motor: No weakness.  Psychiatric:        Attention and Perception: Attention normal.        Mood and Affect: Mood normal.        Speech: Speech normal.        Behavior: Behavior normal. Behavior is cooperative.        Thought Content: Thought content is not paranoid or delusional. Thought content does not include homicidal or suicidal ideation. Thought content does not include homicidal or suicidal plan.    Review of Systems  Constitutional:  Negative for fever.  HENT:  Negative for ear discharge, hearing loss and sore throat.   Eyes:  Negative for pain and discharge.  Respiratory:  Negative for shortness of breath and wheezing.   Cardiovascular:  Negative for chest pain.  Gastrointestinal:  Negative for abdominal pain, diarrhea, nausea and vomiting.  Neurological:  Negative for dizziness, seizures, weakness and headaches.  Psychiatric/Behavioral:  Negative for depression, hallucinations and suicidal ideas.    Blood pressure (!) 123/93, pulse 76, temperature 98.5 F (36.9 C), temperature source Oral, resp. rate 18, SpO2 100 %. There is no height or weight on file to  calculate BMI.  Musculoskeletal: Strength & Muscle Tone: within normal limits Gait & Station: normal Patient leans: N/A   [name] MSE Discharge Disposition for Follow up and Recommendations: Based on my evaluation the patient does not appear to have an emergency medical condition and can be discharged with resources and follow up care in outpatient services for Substance Abuse Intensive Outpatient Program and Individual Therapy.  Recommends patient to follow up with intensive outpatient detox program.  Patient was given resources for outpatient detox treatment program.  Recommends patient follow up with orthopedic surgeon as recommended by MD at the ED.    Discussed methods to reduce the risk of self-injury or suicide attempts: Frequent conversations regarding unsafe thoughts. Remove all significant sharps. Remove all firearms. Remove all medications, including over-the-counter meds. Consider lockbox for medications and having a responsible person dispense medications until patient has strengthened coping skills. Room checks for sharps or other harmful objects. Secure all chemical substances that can be ingested or inhaled.   Please refrain from using alcohol or illicit substances, as they can affect your mood and can cause depression, anxiety  or other concerning symptoms.  Alcohol can increase the chance that a person will make reckless decisions, like attempting suicide, and can increase the lethality of a drug overdose.      Discussed crisis plan, calling 911, or going to the ED if condition changes or worsens.  Patient verbalized her understanding.     Earney Mallet, NP 11/15/2022, 2:40 AM

## 2022-11-15 NOTE — Discharge Instructions (Addendum)
Discharge recommendations:  Patient is to take medications as prescribed. Please see information for follow-up appointment with psychiatry and therapy. Please follow up with your primary care provider for all medical related needs.   Plan: Patient can go home with ortho follow up.  If concerning, likely consult neurosurgery.    Follow-Ups  Schedule an appointment with Vanetta Mulders, MD (Orthopedic Surgery) Go to [place] (Emergency Medicine); If symptoms worsen   Therapy: We recommend that patient participate in individual therapy to address mental health concerns.  Medications: The parent/guardian is to contact a medical professional and/or outpatient provider to address any new side effects that develop. Parent/guardian should update outpatient providers of any new medications and/or medication changes.   Atypical antipsychotics: If you are prescribed an atypical antipsychotic, it is recommended that your height, weight, BMI, blood pressure, fasting lipid panel, and fasting blood sugar be monitored by your outpatient providers.  Safety:  The patient should abstain from use of illicit substances/drugs and abuse of any medications. If symptoms worsen or do not continue to improve or if the patient becomes actively suicidal or homicidal then it is recommended that the patient return to the closest hospital emergency department, the [facility], or call 911 for further evaluation and treatment. National Suicide Prevention Lifeline 1-800-SUICIDE or 229-594-1490.  About 988 988 offers 24/7 access to trained crisis counselors who can help people experiencing mental health-related distress. People can call or text 988 or chat 988lifeline.org for themselves or if they are worried about a loved one who may need crisis support.     Crisis Mobile: [facility]:                     952-794-6012 (for crisis  response 24 hours a day) [place]:                                            ([phone]) [phone]

## 2022-11-15 NOTE — ED Notes (Signed)
Pending discharge home, via taxi voucher.

## 2022-11-21 ENCOUNTER — Other Ambulatory Visit: Payer: Self-pay

## 2022-12-12 ENCOUNTER — Ambulatory Visit: Payer: Self-pay | Admitting: Nurse Practitioner

## 2022-12-26 ENCOUNTER — Other Ambulatory Visit: Payer: Self-pay

## 2022-12-26 ENCOUNTER — Other Ambulatory Visit: Payer: Self-pay | Admitting: Physician Assistant

## 2022-12-26 DIAGNOSIS — F32A Depression, unspecified: Secondary | ICD-10-CM

## 2022-12-26 NOTE — Telephone Encounter (Signed)
D/C 11/13/22.

## 2022-12-27 ENCOUNTER — Other Ambulatory Visit: Payer: Self-pay

## 2023-01-23 ENCOUNTER — Other Ambulatory Visit: Payer: Self-pay | Admitting: Nurse Practitioner

## 2023-01-23 ENCOUNTER — Other Ambulatory Visit (HOSPITAL_BASED_OUTPATIENT_CLINIC_OR_DEPARTMENT_OTHER): Payer: Self-pay

## 2023-01-23 NOTE — Telephone Encounter (Signed)
Medication Refill - Medication: escitalopram (LEXAPRO) 10 MG tablet   Has the patient contacted their pharmacy? Yes.   (Agent: If no, request that the patient contact the pharmacy for the refill. If patient does not wish to contact the pharmacy document the reason why and proceed with request.) (Agent: If yes, when and what did the pharmacy advise?)  Preferred Pharmacy (with phone number or street name):  [place] Phone: [phone]  Fax: [phone]     Has the patient been seen for an appointment in the last year OR does the patient have an upcoming appointment? Yes.    Agent: Please be advised that RX refills may take up to 3 business days. We ask that you follow-up with your pharmacy.  *Patient wants rx sent to [facility], not [facility].

## 2023-01-24 ENCOUNTER — Encounter (HOSPITAL_BASED_OUTPATIENT_CLINIC_OR_DEPARTMENT_OTHER): Payer: Self-pay | Admitting: Pharmacist

## 2023-01-24 ENCOUNTER — Other Ambulatory Visit (HOSPITAL_BASED_OUTPATIENT_CLINIC_OR_DEPARTMENT_OTHER): Payer: Self-pay

## 2023-01-24 NOTE — Telephone Encounter (Signed)
Unable to refill per protocol, Rx expired. Discontinued 03/30/22, dose change.  Requested Prescriptions  Pending Prescriptions Disp Refills   escitalopram (LEXAPRO) 20 MG tablet 30 tablet 0    Sig: Take 1 tablet (20 mg total) by mouth daily.     Psychiatry:  Antidepressants - SSRI Failed - 01/23/2023  2:04 PM      Failed - Valid encounter within last 6 months    Recent Outpatient Visits           3 months ago No-show for appointment   [place] Elsie Stain, MD   9 months ago Alcohol dependence with alcohol-induced mood disorder [facility])   [place], [place], [place]   1 year ago Anxiety and depression   [place], Vernia Buff, NP   1 year ago Right upper quadrant abdominal mass   [place], Vernia Buff, NP   1 year ago Essential hypertension   [place], NP       Future Appointments             In 5 days Gildardo Pounds, NP [place] - Completed PHQ-2 or PHQ-9 in the last 360 days

## 2023-01-29 ENCOUNTER — Ambulatory Visit: Payer: Self-pay | Admitting: Nurse Practitioner

## 2023-02-12 ENCOUNTER — Other Ambulatory Visit: Payer: Self-pay

## 2023-02-12 ENCOUNTER — Other Ambulatory Visit: Payer: Self-pay | Admitting: Nurse Practitioner

## 2023-02-12 NOTE — Telephone Encounter (Signed)
Medication Refill - Medication: escitalopram (LEXAPRO) 10 MG tablet   Has the patient contacted their pharmacy? Yes.   (Agent: If no, request that the patient contact the pharmacy for the refill. If patient does not wish to contact the pharmacy document the reason why and proceed with request.) (Agent: If yes, when and what did the pharmacy advise?)  Preferred Pharmacy (with phone number or street name):  [place] [address], [place] [address]  Phone: [phone] Fax: [phone]   Has the patient been seen for an appointment in the last year OR does the patient have an upcoming appointment? Yes.    Agent: Please be advised that RX refills may take up to 3 business days. We ask that you follow-up with your pharmacy.

## 2023-02-13 NOTE — Telephone Encounter (Signed)
Requested medication (s) are due for refill today: expired medication  Requested medication (s) are on the active medication list: yes   Last refill:  11/14/22- 1 18/24 #30 0 refills  Future visit scheduled: yes in 1 week   Notes to clinic:  patient needs courtesy refill. Expired medication last ordered by Rosezetta Schlatter, MD 11/13/22. Do you want to give courtesy refill until next appt.? Patient calling for request     Requested Prescriptions  Pending Prescriptions Disp Refills   escitalopram (LEXAPRO) 10 MG tablet 30 tablet 0    Sig: Take 1 tablet (10 mg total) by mouth daily.     Psychiatry:  Antidepressants - SSRI Failed - 02/12/2023 10:42 AM      Failed - Valid encounter within last 6 months    Recent Outpatient Visits           3 months ago No-show for appointment   [place] Elsie Stain, MD   10 months ago Alcohol dependence with alcohol-induced mood disorder [facility])   [place], [place], [place]   1 year ago Anxiety and depression   [place], Vernia Buff, NP   1 year ago Right upper quadrant abdominal mass   [place], Vernia Buff, NP   1 year ago Essential hypertension   [place] Gildardo Pounds, NP       Future Appointments             In 1 week Gildardo Pounds, NP [place] - Completed PHQ-2 or PHQ-9 in the last 360 days

## 2023-02-14 ENCOUNTER — Other Ambulatory Visit: Payer: Self-pay

## 2023-02-14 MED ORDER — ESCITALOPRAM OXALATE 10 MG PO TABS
10.0000 mg | ORAL_TABLET | Freq: Every day | ORAL | 0 refills | Status: DC
  Filled 2023-02-14: qty 30, 30d supply, fill #0

## 2023-02-15 ENCOUNTER — Other Ambulatory Visit: Payer: Self-pay

## 2023-02-21 ENCOUNTER — Ambulatory Visit: Payer: 59 | Attending: Nurse Practitioner | Admitting: Nurse Practitioner

## 2023-02-21 ENCOUNTER — Encounter: Payer: Self-pay | Admitting: Nurse Practitioner

## 2023-02-21 ENCOUNTER — Other Ambulatory Visit: Payer: Self-pay

## 2023-02-21 VITALS — BP 132/81 | HR 70 | Ht 69.0 in | Wt 144.2 lb

## 2023-02-21 DIAGNOSIS — R7303 Prediabetes: Secondary | ICD-10-CM

## 2023-02-21 DIAGNOSIS — F419 Anxiety disorder, unspecified: Secondary | ICD-10-CM | POA: Diagnosis not present

## 2023-02-21 DIAGNOSIS — Z125 Encounter for screening for malignant neoplasm of prostate: Secondary | ICD-10-CM

## 2023-02-21 DIAGNOSIS — F32A Depression, unspecified: Secondary | ICD-10-CM | POA: Diagnosis not present

## 2023-02-21 DIAGNOSIS — I10 Essential (primary) hypertension: Secondary | ICD-10-CM

## 2023-02-21 DIAGNOSIS — G629 Polyneuropathy, unspecified: Secondary | ICD-10-CM

## 2023-02-21 MED ORDER — GABAPENTIN 300 MG PO CAPS
300.0000 mg | ORAL_CAPSULE | Freq: Three times a day (TID) | ORAL | 3 refills | Status: DC
  Filled 2023-02-21: qty 90, 30d supply, fill #0
  Filled 2023-04-24: qty 90, 30d supply, fill #1

## 2023-02-21 MED ORDER — ESCITALOPRAM OXALATE 20 MG PO TABS
20.0000 mg | ORAL_TABLET | Freq: Every day | ORAL | 1 refills | Status: DC
  Filled 2023-02-21: qty 30, 30d supply, fill #0
  Filled 2023-04-24: qty 30, 30d supply, fill #1
  Filled 2023-06-21: qty 30, 30d supply, fill #2
  Filled 2023-08-16: qty 30, 30d supply, fill #3
  Filled 2024-01-11: qty 30, 30d supply, fill #4

## 2023-02-21 NOTE — Progress Notes (Signed)
Assessment & Plan:  [name] [name] was seen today for hypertension.  Diagnoses and all orders for this visit:  Essential hypertension -     CMP14+EGFR Continue all antihypertensives as prescribed.  Reminded to bring in blood pressure log for follow  up appointment.  RECOMMENDATIONS: DASH/Mediterranean Diets are healthier choices for HTN.    Prediabetes -     Hemoglobin A1c -     CMP14+EGFR Continue blood sugar control as discussed in office today, low carbohydrate diet, and regular physical exercise as tolerated, 150 minutes per week (30 min each day, 5 days per week, or 50 min 3 days per week). Keep blood sugar logs with fasting goal of 90-130 mg/dl, post prandial (after you eat) less than 180.  For Hypoglycemia: BS <60 and Hyperglycemia BS >400; contact the clinic ASAP. Annual eye exams and foot exams are recommended.   Neuropathy -     gabapentin (NEURONTIN) 300 MG capsule; Take 1 capsule (300 mg total) by mouth 3 (three) times daily.  Anxiety and depression -     escitalopram (LEXAPRO) 20 MG tablet; Take 1 tablet (20 mg total) by mouth daily. -     gabapentin (NEURONTIN) 300 MG capsule; Take 1 capsule (300 mg total) by mouth 3 (three) times daily. -     Ambulatory referral to [facility]  Prostate cancer screening -     PSA    Patient has been counseled on age-appropriate routine health concerns for screening and prevention. These are reviewed and up-to-date. Referrals have been placed accordingly. Immunizations are up-to-date or declined.    Subjective:   Chief Complaint  Patient presents with   Hypertension   Hypertension Pertinent negatives include no blurred vision, chest pain, headaches, malaise/fatigue, palpitations or shortness of breath.   [name] [name] 62 y.o. 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   He stopped drinking alcohol completely 3 months ago. Feels hungry all the time now that he has stopped drinking   HTN Blood pressure is well controlled. He is not taking amlodipine 5 mg daily.  BP Readings from Last 3 Encounters:  02/21/23 132/81  11/15/22 (!) 123/93  11/15/22 (!) 149/98    Anxiety  He has tried celexa in the past which was ineffective and caused him to feel more anxious and unable to sleep . Other medications tried include buspar. At one point we prescribed hydroxyzine however his previous insurance would not pay for this. He has new coverage now. He stopped drinking alcohol 3 months ago.  Would like to be referred to psychiatry for anxiety and to also be evaluated for ADD/ADHD. Feels his mind is always racing and he can't seem to ever relax. He is currently prescribed lexapro 10mg  but it should have been refilled at his previous dose of 20mg . We will change dose today.     02/21/2023    3:44 PM 05/31/2022    9:22 AM 04/13/2022    8:48 AM 12/02/2021   10:19 AM  GAD 7 : Generalized Anxiety Score  Nervous, Anxious, on Edge 2 3 0 3  Control/stop worrying 2 1 0 2  Worry too much - different things 2 0 0 2  Trouble relaxing 2 1 0 2  Restless 2 2 0 3  Easily annoyed or irritable 1 0 0 1  Afraid - awful might happen 0 0 0 0  Total GAD 7 Score 11 7 0 13  Anxiety Difficulty  Somewhat difficult Not difficult at all Somewhat difficult        02/21/2023    3:43 PM 11/11/2022    3:51 PM 11/10/2022   12:58 PM  Depression screen PHQ 2/9  Decreased Interest 2 2 3   Down, Depressed, Hopeless 1 1 3   PHQ - 2 Score 3 3 6   Altered sleeping 3 3 2   Tired, decreased energy 1 2 2   Change in appetite 1 1 2   Feeling bad or failure about yourself  1 3 2    Trouble concentrating 1 1 2   Moving slowly or fidgety/restless 1 2 2   Suicidal thoughts 1 0 2  PHQ-9 Score 12 15 20   Difficult doing work/chores  Somewhat difficult Very difficult    Doe not feel gabapentin 200 mg TID is effective in relieving his neuropathy.   Review of Systems  Constitutional:  Negative for fever, malaise/fatigue and weight loss.  HENT: Negative.  Negative for nosebleeds.   Eyes: Negative.  Negative for blurred vision, double vision and photophobia.       Itchy eyes  Respiratory: Negative.  Negative for cough and shortness of breath.   Cardiovascular: Negative.  Negative for chest pain, palpitations and leg swelling.  Gastrointestinal: Negative.  Negative for heartburn, nausea and vomiting.  Musculoskeletal: Negative.  Negative for myalgias.  Skin:  Positive for itching.  Neurological:  Positive for tingling and sensory change. Negative for dizziness, focal weakness, seizures and headaches.  Psychiatric/Behavioral:  Positive for depression and memory loss. Negative for suicidal ideas. The patient is nervous/anxious and has insomnia.     Past Medical History:  Diagnosis Date   Alcohol abuse    Anxiety and depression    Bilateral primary osteoarthritis of knee    Carpal tunnel syndrome of right wrist Dx 2015   Cervical stenosis of spine    MRI 10/2021   Essential hypertension    Gallbladder sludge    GERD (gastroesophageal reflux disease) Dx 2007   Hepatic steatosis    History of alcoholic hepatitis    History of CVA (cerebrovascular accident)    05/2021 MRI brain: Mild chronic microvascular ischemic change in the white matter. Small chronic infarct left superior cerebellum.  Also, chronic microhemorrhage, ? past trauma   History of homeless    History of pancreatitis    Hyperlipidemia    borderline, diet controlled   Medial meniscus tear    2016   Neuropathy, alcoholic ([place])    NCS 123456 R leg   Pneumonia 11/2014   Recurrent anterior dislocation of  shoulder 05/15/2015   Sarcoidosis of lung ([place]) Dx 1989   Seborrheic dermatitis     Past Surgical History:  Procedure Laterality Date   carpel tunnel release Right 07/28/2014    done in [place] ARTHROSCOPY     KNEE SURGERY Left    15 years ago   LIPOMA EXCISION N/A 05/10/2016   Procedure: EXCISION OF SCALP LIPOMA;  Surgeon: Johnathan Hausen, MD;  Location: [facility];  Service: General;  Laterality: N/A;    Family History  Problem Relation Age of Onset   Diabetes Father    Hypertension Father    Hypertension Paternal Grandfather  Alcohol abuse Paternal Grandfather    Alcohol abuse Maternal Grandfather    Alcohol abuse Maternal Grandmother    Alcohol abuse Paternal Grandmother    Colon cancer Neg Hx    Rectal cancer Neg Hx    Stomach cancer Neg Hx    Bipolar disorder Neg Hx    Depression Neg Hx     Social History Reviewed with no changes to be made today.   Outpatient Medications Prior to Visit  Medication Sig Dispense Refill   hydrOXYzine (ATARAX) 25 MG tablet Take 1 tablet (25 mg total) by mouth every 6 (six) hours as needed for anxiety (or CIWA score </= 10). 30 tablet 0   traZODone (DESYREL) 50 MG tablet Take 1 tablet (50 mg total) by mouth at bedtime as needed for sleep.     chlordiazePOXIDE (LIBRIUM) 25 MG capsule Take 1 capsule (25 mg total) by mouth 3 (three) times daily. 30 capsule 0   escitalopram (LEXAPRO) 10 MG tablet Take 1 tablet (10 mg total) by mouth daily. Please keep upcoming appointment for more refills. 30 tablet 0   gabapentin (NEURONTIN) 100 MG capsule Take 2 capsules (200 mg total) by mouth 3 (three) times daily. 180 capsule 3   chlordiazePOXIDE (LIBRIUM) 25 MG capsule Take 1 capsule (25 mg total) by mouth 2 (two) times daily. (Patient not taking: Reported on 02/21/2023) 30 capsule 0   No facility-administered medications prior to visit.    Allergies  Allergen Reactions   Oxycodone Other (See Comments)     abd pain, stomach cramps        Objective:    BP 132/81   Pulse 70   Ht 5\' 9"  (1.753 m)   Wt 144 lb 3.2 oz (65.4 kg)   SpO2 98%   BMI 21.29 kg/m  Wt Readings from Last 3 Encounters:  02/21/23 144 lb 3.2 oz (65.4 kg)  11/10/22 150 lb (68 kg)  05/31/22 149 lb (67.6 kg)    Physical Exam Vitals and nursing note reviewed.  Constitutional:      Appearance: He is well-developed.  HENT:     Head: Normocephalic and atraumatic.  Cardiovascular:     Rate and Rhythm: Normal rate and regular rhythm.     Heart sounds: Normal heart sounds. No murmur heard.    No friction rub. No gallop.  Pulmonary:     Effort: Pulmonary effort is normal. No tachypnea or respiratory distress.     Breath sounds: Normal breath sounds. No decreased breath sounds, wheezing, rhonchi or rales.  Chest:     Chest wall: No tenderness.  Abdominal:     General: Bowel sounds are normal.     Palpations: Abdomen is soft.  Musculoskeletal:        General: Normal range of motion.     Cervical back: Normal range of motion.  Skin:    General: Skin is warm and dry.  Neurological:     Mental Status: He is alert and oriented to person, place, and time.     Coordination: Coordination normal.  Psychiatric:        Behavior: Behavior normal. Behavior is cooperative.        Thought Content: Thought content normal.        Judgment: Judgment normal.          Patient has been counseled extensively about nutrition and exercise as well as the importance of adherence with medications and regular follow-up. The patient was given clear instructions to go to ER or  return to medical center if symptoms don't improve, worsen or new problems develop. The patient verbalized understanding.   Follow-up: Return in about 3 months (around 05/24/2023).   Gildardo Pounds, FNP-BC [facility] [place], [place]   02/21/2023, 4:06 PM

## 2023-02-23 LAB — CMP14+EGFR
ALT: 11 IU/L (ref 0–44)
AST: 18 IU/L (ref 0–40)
Albumin/Globulin Ratio: 1.4 (ref 1.2–2.2)
Albumin: 4.3 g/dL (ref 3.9–4.9)
Alkaline Phosphatase: 42 IU/L — ABNORMAL LOW (ref 44–121)
BUN/Creatinine Ratio: 8 — ABNORMAL LOW (ref 10–24)
BUN: 9 mg/dL (ref 8–27)
Bilirubin Total: <0.2 mg/dL (ref 0.0–1.2)
CO2: 24 mmol/L (ref 20–29)
Calcium: 9.6 mg/dL (ref 8.6–10.2)
Chloride: 100 mmol/L (ref 96–106)
Creatinine, Ser: 1.06 mg/dL (ref 0.76–1.27)
Globulin, Total: 3.1 g/dL (ref 1.5–4.5)
Glucose: 78 mg/dL (ref 70–99)
Potassium: 4.2 mmol/L (ref 3.5–5.2)
Sodium: 140 mmol/L (ref 134–144)
Total Protein: 7.4 g/dL (ref 6.0–8.5)
eGFR: 80 mL/min/{1.73_m2} (ref >59)

## 2023-02-23 LAB — PSA: Prostate Specific Ag, Serum: 5.3 ng/mL — ABNORMAL HIGH (ref 0.0–4.0)

## 2023-02-23 LAB — HEMOGLOBIN A1C
Est. average glucose Bld gHb Est-mCnc: 117 mg/dL
Hgb A1c MFr Bld: 5.7 % — ABNORMAL HIGH (ref 4.8–5.6)

## 2023-03-07 ENCOUNTER — Other Ambulatory Visit: Payer: Self-pay | Admitting: Nurse Practitioner

## 2023-03-07 ENCOUNTER — Telehealth: Payer: Self-pay | Admitting: Nurse Practitioner

## 2023-03-07 DIAGNOSIS — M2550 Pain in unspecified joint: Secondary | ICD-10-CM

## 2023-03-07 NOTE — Telephone Encounter (Signed)
Needs bloodwork. If positive for any rheumatoid arthritis I will send to rheumatology. If not positive he will need to go to pain management

## 2023-03-07 NOTE — Telephone Encounter (Signed)
Routing to PCP for review.

## 2023-03-07 NOTE — Telephone Encounter (Signed)
Pt is calling to request a referral to Rheumatology. Pt reports having a pain in his hands, wrists, knees. Pt has discuss this with Zelda CB- [phone]

## 2023-03-08 ENCOUNTER — Other Ambulatory Visit: Payer: 59

## 2023-03-08 NOTE — Telephone Encounter (Signed)
Patient aware or response and appt scheduled.

## 2023-03-09 ENCOUNTER — Ambulatory Visit: Payer: Self-pay | Attending: Family Medicine

## 2023-03-09 DIAGNOSIS — M2550 Pain in unspecified joint: Secondary | ICD-10-CM | POA: Diagnosis not present

## 2023-03-13 ENCOUNTER — Other Ambulatory Visit: Payer: Self-pay | Admitting: Nurse Practitioner

## 2023-03-13 DIAGNOSIS — R768 Other specified abnormal immunological findings in serum: Secondary | ICD-10-CM

## 2023-03-13 DIAGNOSIS — M129 Arthropathy, unspecified: Secondary | ICD-10-CM

## 2023-03-13 DIAGNOSIS — M0580 Other rheumatoid arthritis with rheumatoid factor of unspecified site: Secondary | ICD-10-CM

## 2023-03-13 LAB — ENA+DNA/DS+ANTICH+CENTRO+FA...
Anti JO-1: <0.2 AI (ref 0.0–0.9)
Antiribosomal P Antibodies: <0.2 AI (ref 0.0–0.9)
Centromere Ab Screen: <0.2 AI (ref 0.0–0.9)
Chromatin Ab SerPl-aCnc: <0.2 AI (ref 0.0–0.9)
ENA RNP Ab: <0.2 AI (ref 0.0–0.9)
ENA SM Ab Ser-aCnc: <0.2 AI (ref 0.0–0.9)
ENA SSA (RO) Ab: <0.2 AI (ref 0.0–0.9)
ENA SSB (LA) Ab: <0.2 AI (ref 0.0–0.9)
Scleroderma (Scl-70) (ENA) Antibody, IgG: <0.2 AI (ref 0.0–0.9)
Smith/RNP Antibodies: <0.2 AI (ref 0.0–0.9)
Speckled Pattern: 1:80 {titer}
dsDNA Ab: <1 IU/mL (ref 0–9)

## 2023-03-13 LAB — C-REACTIVE PROTEIN: CRP: 1 mg/L (ref 0–10)

## 2023-03-13 LAB — ANA W/REFLEX: ANA Titer 1: POSITIVE — AB

## 2023-03-13 LAB — SEDIMENTATION RATE: Sed Rate: 56 mm/hr — ABNORMAL HIGH (ref 0–30)

## 2023-03-13 LAB — RHEUMATOID FACTOR: Rheumatoid fact SerPl-aCnc: 15.2 IU/mL — ABNORMAL HIGH (ref <14.0)

## 2023-04-22 ENCOUNTER — Emergency Department (HOSPITAL_COMMUNITY)
Admission: EM | Admit: 2023-04-22 | Discharge: 2023-04-22 | Disposition: A | Discharge status: Home / Self Care | Payer: 59 | Attending: Emergency Medicine | Admitting: Emergency Medicine

## 2023-04-22 ENCOUNTER — Encounter (HOSPITAL_COMMUNITY): Payer: Self-pay

## 2023-04-22 DIAGNOSIS — R251 Tremor, unspecified: Secondary | ICD-10-CM | POA: Insufficient documentation

## 2023-04-22 DIAGNOSIS — R531 Weakness: Secondary | ICD-10-CM | POA: Diagnosis not present

## 2023-04-22 DIAGNOSIS — Z743 Need for continuous supervision: Secondary | ICD-10-CM | POA: Diagnosis not present

## 2023-04-22 DIAGNOSIS — F10920 Alcohol use, unspecified with intoxication, uncomplicated: Secondary | ICD-10-CM

## 2023-04-22 DIAGNOSIS — Y905 Blood alcohol level of 100-119 mg/100 ml: Secondary | ICD-10-CM | POA: Insufficient documentation

## 2023-04-22 DIAGNOSIS — I10 Essential (primary) hypertension: Secondary | ICD-10-CM | POA: Diagnosis not present

## 2023-04-22 DIAGNOSIS — F19939 Other psychoactive substance use, unspecified with withdrawal, unspecified: Secondary | ICD-10-CM | POA: Diagnosis not present

## 2023-04-22 DIAGNOSIS — F10129 Alcohol abuse with intoxication, unspecified: Secondary | ICD-10-CM | POA: Insufficient documentation

## 2023-04-22 LAB — COMPREHENSIVE METABOLIC PANEL
ALT: 17 U/L (ref 0–44)
AST: 35 U/L (ref 15–41)
Albumin: 4.8 g/dL (ref 3.5–5.0)
Alkaline Phosphatase: 41 U/L (ref 38–126)
Anion gap: 14 (ref 5–15)
BUN: 8 mg/dL (ref 8–23)
CO2: 23 mmol/L (ref 22–32)
Calcium: 9.4 mg/dL (ref 8.9–10.3)
Chloride: 95 mmol/L — ABNORMAL LOW (ref 98–111)
Creatinine, Ser: 1.04 mg/dL (ref 0.61–1.24)
GFR, Estimated: >60 mL/min (ref >60)
Glucose, Bld: 74 mg/dL (ref 70–99)
Potassium: 4.8 mmol/L (ref 3.5–5.1)
Sodium: 132 mmol/L — ABNORMAL LOW (ref 135–145)
Total Bilirubin: 1.8 mg/dL — ABNORMAL HIGH (ref 0.3–1.2)
Total Protein: 9.6 g/dL — ABNORMAL HIGH (ref 6.5–8.1)

## 2023-04-22 LAB — CBC
HCT: 44.8 % (ref 39.0–52.0)
Hemoglobin: 14.5 g/dL (ref 13.0–17.0)
MCH: 27.2 pg (ref 26.0–34.0)
MCHC: 32.4 g/dL (ref 30.0–36.0)
MCV: 83.9 fL (ref 80.0–100.0)
Platelets: 213 10*3/uL (ref 150–400)
RBC: 5.34 MIL/uL (ref 4.22–5.81)
RDW: 15.3 % (ref 11.5–15.5)
WBC: 6.3 10*3/uL (ref 4.0–10.5)
nRBC: 0 % (ref 0.0–0.2)

## 2023-04-22 LAB — RAPID URINE DRUG SCREEN, HOSP PERFORMED
Amphetamines: NOT DETECTED
Barbiturates: NOT DETECTED
Benzodiazepines: NOT DETECTED
Cocaine: NOT DETECTED
Opiates: NOT DETECTED
Tetrahydrocannabinol: NOT DETECTED

## 2023-04-22 LAB — ETHANOL: Alcohol, Ethyl (B): 103 mg/dL — ABNORMAL HIGH (ref <10)

## 2023-04-22 MED ORDER — LORAZEPAM 1 MG PO TABS
0.0000 mg | ORAL_TABLET | Freq: Four times a day (QID) | ORAL | Status: DC
  Administered 2023-04-22: 1 mg via ORAL
  Filled 2023-04-22 (×2): qty 1

## 2023-04-22 MED ORDER — CHLORDIAZEPOXIDE HCL 25 MG PO CAPS
ORAL_CAPSULE | ORAL | 0 refills | Status: AC
  Filled 2023-04-22: qty 10, 3d supply, fill #0

## 2023-04-22 MED ORDER — LORAZEPAM 1 MG PO TABS: 0.0000 mg | ORAL_TABLET | Freq: Two times a day (BID) | ORAL | Status: DC

## 2023-04-22 MED ORDER — ONDANSETRON HCL 4 MG/2ML IJ SOLN
4.0000 mg | Freq: Once | INTRAMUSCULAR | Status: AC
  Administered 2023-04-22: 4 mg via INTRAVENOUS
  Filled 2023-04-22: qty 2

## 2023-04-22 MED ORDER — LORAZEPAM 2 MG/ML IJ SOLN: 0.0000 mg | Freq: Two times a day (BID) | INTRAMUSCULAR | Status: DC

## 2023-04-22 MED ORDER — LORAZEPAM 2 MG/ML IJ SOLN: 0.0000 mg | Freq: Four times a day (QID) | INTRAMUSCULAR | Status: DC

## 2023-04-22 MED ORDER — CHLORDIAZEPOXIDE HCL 25 MG PO CAPS
50.0000 mg | ORAL_CAPSULE | Freq: Once | ORAL | Status: AC
  Administered 2023-04-22: 50 mg via ORAL
  Filled 2023-04-22: qty 2

## 2023-04-22 MED ORDER — SODIUM CHLORIDE 0.9 % IV BOLUS
1000.0000 mL | Freq: Once | INTRAVENOUS | Status: AC
  Administered 2023-04-22: 1000 mL via INTRAVENOUS

## 2023-04-22 MED ORDER — ONDANSETRON HCL 4 MG PO TABS
4.0000 mg | ORAL_TABLET | Freq: Four times a day (QID) | ORAL | 0 refills | Status: DC
  Filled 2023-04-22: qty 12, 3d supply, fill #0

## 2023-04-22 NOTE — ED Triage Notes (Signed)
Pt arrived via EMS, from home. Has hx of alcohol abuse.   12 beers a day, has been trying to cut back. Tremulous on arrival.   Last drink last night.

## 2023-04-22 NOTE — ED Notes (Signed)
Pt given urinal.

## 2023-04-22 NOTE — ED Provider Notes (Cosign Needed Addendum)
[place] EMERGENCY DEPARTMENT AT [facility] Provider Note   CSN: [id number] Arrival date & time: 04/22/23  7829     History  Chief Complaint  Patient presents with   Alcohol Problem    [name] [name] Doctor is a 62 y.o. male with a [place] history significant for alcohol abuse, anxiety, depression, hypertension, GERD presents today for evaluation of alcohol intoxication.  Patient reports drinking about 12 beers a day.  He reports drinking was this morning.  States that he has has nausea, vomiting, abdominal pain and dizziness.  He denies any auditory, visual hallucinations.  He has tremors on both his hands.  States feel really weak and shaky.   Alcohol Problem      Past Medical History:  Diagnosis Date   Alcohol abuse    Anxiety and depression    Bilateral primary osteoarthritis of knee    Carpal tunnel syndrome of right wrist Dx 2015   Cervical stenosis of spine    MRI 10/2021   Essential hypertension    Gallbladder sludge    GERD (gastroesophageal reflux disease) Dx 2007   Hepatic steatosis    History of alcoholic hepatitis    History of CVA (cerebrovascular accident)    05/2021 MRI brain: Mild chronic microvascular ischemic change in the white matter. Small chronic infarct left superior cerebellum.  Also, chronic microhemorrhage, ? 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   Sarcoidosis of lung ([facility]) Dx 1989   Seborrheic dermatitis    Past Surgical History:  Procedure Laterality Date   carpel tunnel release Right 07/28/2014    done in [place]    ELBOW SURGERY     KNEE ARTHROSCOPY     KNEE SURGERY Left    15 years ago   LIPOMA EXCISION N/A 05/10/2016   Procedure: EXCISION OF SCALP LIPOMA;  Surgeon: [name], MD;  Location:  [facility];  Service: General;  Laterality: N/A;     Home Medications Prior to Admission medications   Medication Sig Start Date End Date Taking? Authorizing Provider  escitalopram (LEXAPRO) 20 MG tablet Take 1 tablet (20 mg total) by mouth daily. 02/21/23   Claiborne Rigg, NP  gabapentin (NEURONTIN) 300 MG capsule Take 1 capsule (300 mg total) by mouth 3 (three) times daily. 02/21/23   Claiborne Rigg, NP  hydrOXYzine (ATARAX) 25 MG tablet Take 1 tablet (25 mg total) by mouth every 6 (six) hours as needed for anxiety (or CIWA score </= 10). 11/13/22   Lamar Sprinkles, MD  traZODone (DESYREL) 50 MG tablet Take 1 tablet (50 mg total) by mouth at bedtime as needed for sleep. 11/13/22   Lamar Sprinkles, MD      Allergies    Oxycodone    Review of Systems   Review of Systems Negative except as per HPI.  Physical Exam Updated Vital Signs BP (!) 144/105 (BP Location: Right Arm)   Pulse 80   Temp 97.8 F (36.6 C) (Oral)   Resp 16   SpO2 100%  Physical Exam Vitals and nursing note reviewed.  Constitutional:      Appearance: Normal appearance.  HENT:     Head: Normocephalic and atraumatic.     Mouth/Throat:  Mouth: Mucous membranes are moist.  Eyes:     General: No scleral icterus. Cardiovascular:     Rate and Rhythm: Normal rate and regular rhythm.     Pulses: Normal pulses.     Heart sounds: Normal heart sounds.  Pulmonary:     Effort: Pulmonary effort is normal.     Breath sounds: Normal breath sounds.  Abdominal:     General: Abdomen is flat.     Palpations: Abdomen is soft.     Tenderness: There is no abdominal tenderness.  Musculoskeletal:        General: No deformity.  Skin:    General: Skin is warm.     Findings: No rash.  Neurological:     General: No focal deficit present.     Mental Status: He is alert.     Comments: Mild bilateral hand tremors.  Awake, alert and oriented.  Able to respond to questions and commands appropriately.   Psychiatric:        Mood and Affect: Mood normal.     ED Results / Procedures / Treatments   Labs (all labs ordered are listed, but only abnormal results are displayed) Labs Reviewed - No data to display  EKG None  Radiology No results found.  Procedures Procedures    Medications Ordered in ED Medications  ondansetron (ZOFRAN) injection 4 mg (has no administration in time range)  LORazepam (ATIVAN) injection 0-4 mg (has no administration in time range)    Or  LORazepam (ATIVAN) tablet 0-4 mg (has no administration in time range)  LORazepam (ATIVAN) injection 0-4 mg (has no administration in time range)    Or  LORazepam (ATIVAN) tablet 0-4 mg (has no administration in time range)  chlordiazePOXIDE (LIBRIUM) capsule 50 mg (has no administration in time range)    ED Course/ Medical Decision Making/ A&P                             Medical Decision Making Amount and/or Complexity of Data Reviewed Labs: ordered.  Risk Prescription drug management.   This patient presents to the ED for nausea, vomiting, tremors, this involves an extensive number of treatment options, and is a complaint that carries with a high risk of complications and morbidity.  The differential diagnosis includes alcohol intoxication, alcohol withdrawal, polysubstance abuse, electrolyte abnormalities, SBO, gastroenteritis, appendicitis, diverticulitis, infectious etiology.  This is not an exhaustive list.  Lab tests: I ordered and personally interpreted labs.  The pertinent results include: WBC unremarkable. Hbg unremarkable. Platelets unremarkable. Electrolytes unremarkable. BUN, creatinine unremarkable.  Ethanol level 103.  Rapid urine drug screen negative.  Problem list/ ED course/ Critical interventions/ Medical management: HPI: See above Vital signs within normal range and stable throughout visit. Laboratory/imaging studies significant for: See above. On physical examination, patient is  afebrile and appears in no acute distress.  Patient presents with nausea, vomiting, bilateral hand tremors likely secondary to EtOH intoxication.  Original CIWA score was 6.  Repeat CIWA score was 8.  However patient reports significant improvement with his nausea, vomiting and hand tremors.  He is able to tolerate p.o. here.  Patient maintained his airway, and metabolized to sobriety and no longer vomiting or having hand tremors.  No infectious symptoms and afebrile so doubt sepsis.  With no signs of any medical emergencies at this time.  Patient observed until clinically sober.  No signs or symptoms of alcohol withdrawal while in the emergency department.  I will provide resource guide for substance abuse counseling.  I also sent an Rx of Librium and Zofran.  Advised patient to follow-up with his primary care physician for further evaluation management.  Strict return precaution discussed. I have reviewed the patient home medicines and have made adjustments as needed.  Cardiac monitoring/EKG: The patient was maintained on a cardiac monitor.  I personally reviewed and interpreted the cardiac monitor which showed an underlying rhythm of: sinus rhythm.  Additional history obtained: External records from outside source obtained and reviewed including: Chart review including previous notes, labs, imaging.  Consultations obtained:  Disposition Continued outpatient therapy. Follow-up with PCP recommended for reevaluation of symptoms. Treatment plan discussed with patient.  Pt acknowledged understanding was agreeable to the plan. Worrisome signs and symptoms were discussed with patient, and patient acknowledged understanding to return to the ED if they noticed these signs and symptoms. Patient was stable upon discharge.   This chart was dictated using voice recognition software.  Despite best efforts to proofread,  errors can occur which can change the documentation meaning.          Final Clinical  Impression(s) / ED Diagnoses Final diagnoses:  Alcoholic intoxication without complication ([facility])    Rx / DC Orders ED Discharge Orders          Ordered    chlordiazePOXIDE (LIBRIUM) 25 MG capsule        04/22/23 1258    ondansetron (ZOFRAN) 4 MG tablet  Every 6 hours        04/22/23 1258              Jeanelle Malling, PA 04/22/23 1310    Jeanelle Malling, [place] 04/22/23 1311    Long, Arlyss Repress, MD 04/24/23 2225

## 2023-04-22 NOTE — Discharge Instructions (Signed)
Please take your medications as prescribed. Take zofran as needed for nausea/vomiting. I recommend close follow-up with PCP for reevaluation.  Please do not hesitate to return to emergency department if worrisome signs symptoms we discussed become apparent.

## 2023-04-24 ENCOUNTER — Ambulatory Visit: Payer: 59 | Admitting: Nurse Practitioner

## 2023-04-24 ENCOUNTER — Ambulatory Visit: Payer: Self-pay | Admitting: *Deleted

## 2023-04-24 ENCOUNTER — Other Ambulatory Visit: Payer: Self-pay

## 2023-04-24 NOTE — Telephone Encounter (Signed)
  Chief Complaint: Wanted to increase his anxiety medication.   Need ED follow up appt.   Seen in ED 04/22/2023 for alcohol intoxication.   Wants to discuss restarting his medication with Bertram Denver, NP. Symptoms: anxiety and drinking alcohol again. Frequency: Seen in ED 04/22/2023 Pertinent Negatives: Patient denies taking his Hydroxyzine for a while. Disposition: [] ED /[] Urgent Care (no appt availability in office) / [x] Appointment(In office/virtual)/ []  [facility]/ [] Home Care/ [] Refused Recommended Disposition /[] [facility]/ []  Follow-up with PCP Additional Notes: Appt made for today at 9:50 with Bertram Denver, NP.

## 2023-04-24 NOTE — Telephone Encounter (Signed)
Reason for Disposition  [1] Started on anti-anxiety medication AND [2] no relief  Answer Assessment - Initial Assessment Questions 1. CONCERN: "Did anything happen that prompted you to call today?"      Pt called in wanting to increase his anxiety medication.   He was difficult to triage as he was jumping from one medication to another that he was taking it he was not taking it.    I had to ask several questions before finding out what he needed exactly. He was seen in the ED on  04/22/2023 for alcohol intoxication.  He mentioned he has not been taking the Hydroxyzine for a while.    "I didn't get it refilled".   I want to restart it.  He is taking Lexapro and Gabapentin that Bertram Denver, NP put him on.  He last saw Bertram Denver, NP 02/21/2023. 2. ANXIETY SYMPTOMS: "Can you describe how you (your loved one; patient) have been feeling?" (e.g., tense, restless, panicky, anxious, keyed up, overwhelmed, sense of impending doom).      He had a slip up and started drinking alcohol again and ended up in the ED on 04/22/2023. 3. ONSET: "How long have you been feeling this way?" (e.g., hours, days, weeks)     No further triage done at this point since evaluated in ED and needs a hospital follow up appt and to discuss restarting his anxiety medication.   Appt made with Bertram Denver, NP for today at 9:50 Am. He has to get a ride.   If he cannot get a ride he will call back and reschedule the appt.  I let him know that would be fine. 4. SEVERITY: "How would you rate the level of anxiety?" (e.g., 0 - 10; or mild, moderate, severe).     N/A   No further triage needed at this point. 6. HISTORY: "Have you felt this way before?" "Have you ever been diagnosed with an anxiety problem in the past?" (e.g., generalized anxiety disorder, panic attacks, PTSD). If Yes, ask: "How was this problem treated?" (e.g., medicines, counseling, etc.)     Yes has a history of anxiety for many years. 7. RISK OF HARM - SUICIDAL  IDEATION: "Do you ever have thoughts of hurting or killing yourself?" If Yes, ask:  "Do you have these feelings now?" "Do you have a plan on how you would do this?"     Not asked 8. TREATMENT:  "What has been done so far to treat this anxiety?" (e.g., medicines, relaxation strategies). "What has helped?"     Seen in ED 04/22/2023 9. TREATMENT - THERAPIST: "Do you have a counselor or therapist? Name?"     N/A 10. POTENTIAL TRIGGERS: "Do you drink caffeinated beverages (e.g., coffee, colas, teas), and how much daily?" "Do you drink alcohol or use any drugs?" "Have you started any new medicines recently?"       Not asked 11. PATIENT SUPPORT: "Who is with you now?" "Who do you live with?" "Do you have family or friends who you can talk to?"        Not asked 12. OTHER SYMPTOMS: "Do you have any other symptoms?" (e.g., feeling depressed, trouble concentrating, trouble sleeping, trouble breathing, palpitations or fast heartbeat, chest pain, sweating, nausea, or diarrhea)       Not asked 13. PREGNANCY: "Is there any chance you are pregnant?" "When was your last menstrual period?"       N/A  Protocols used: Anxiety and Panic Attack-A-AH

## 2023-05-09 NOTE — Progress Notes (Signed)
Patient ID: [name] [name], male   DOB: June 28, 1961, 62 y.o.   MRN: [id number]   [name] [name], is a 62 y.o. male  [id number]:[id number]  [id number]  DOB - [birth year]  Chief Complaint  Patient presents with   Hospitalization Follow-up   Dizziness   Sore Throat       Subjective:   [name] [name] is a 62 y.o. male here today for a follow up visit after being seen at the ED 04/22/2023 with alcohol w/d.  He needs a RF on hydroxyzine.  He did drink alcohol a few days ago.  He has been trying not to drink but not engaging is recovery.  He feels weak and at times dizzy.  Appetite is poor and stools have been loose. Has not had alcohol in 4 days.  No melena/hematochezia/hematemesis.  No vomiting  I gave him crackers and a nutrigrain bar and had him drink 16 ounces water while he was here and he started to feel much better after about 30 mins    After ED visit 04/22/2023 for alcohol W/D: [name] [name] is a 62 y.o. male with a Paschal history significant for alcohol abuse, anxiety, depression, hypertension, GERD presents today for evaluation of alcohol intoxication.  Patient reports drinking about 12 beers a day.  He reports drinking was this morning.  States that he has has nausea, vomiting, abdominal pain and dizziness.  He denies any auditory, visual hallucinations.  He has tremors on both his hands.  States feel really weak and shaky    This patient presents to the ED for nausea, vomiting, tremors, this involves an extensive number of treatment options, and is a complaint that carries with a high risk of complications and morbidity.  The differential diagnosis includes alcohol intoxication, alcohol withdrawal, polysubstance abuse, electrolyte abnormalities, SBO, gastroenteritis, appendicitis, diverticulitis, infectious etiology.  This is not an exhaustive list.   Lab tests: I ordered and personally interpreted labs.  The pertinent results include: WBC unremarkable. Hbg unremarkable. Platelets unremarkable.  Electrolytes unremarkable. BUN, creatinine unremarkable.  Ethanol level 103.  Rapid urine drug screen negative.   Problem list/ ED course/ Critical interventions/ Medical management: HPI: See above Vital signs within normal range and stable throughout visit. Laboratory/imaging studies significant for: See above. On physical examination, patient is afebrile and appears in no acute distress.  Patient presents with nausea, vomiting, bilateral hand tremors likely secondary to EtOH intoxication.  Original CIWA score was 6.  Repeat CIWA score was 8.  However patient reports significant improvement with his nausea, vomiting and hand tremors.  He is able to tolerate p.o. here.  Patient maintained his airway, and metabolized to sobriety and no longer vomiting or having hand tremors.  No infectious symptoms and afebrile so doubt sepsis.  With no signs of any medical emergencies at this time.  Patient observed until clinically sober.  No signs or symptoms of alcohol withdrawal while in the emergency department.  I will provide resource guide for substance abuse counseling.  I also sent an Rx of Librium and Zofran.  Advised patient to follow-up with his primary care physician for further evaluation management.  Strict return precaution discussed. I have reviewed the patient home medicines and have made adjustments as needed.   Cardiac monitoring/EKG: The patient was maintained on a cardiac monitor.  I personally reviewed and interpreted the cardiac monitor which showed an underlying rhythm of: sinus rhythm.   Additional history obtained: External records from outside source obtained and reviewed including: Chart review including previous notes,  labs, imaging.   Consultations obtained:   Disposition Continued outpatient therapy. Follow-up with PCP recommended for reevaluation of symptoms. Treatment plan discussed with patient.  Pt acknowledged understanding was agreeable to the plan. Worrisome signs and  symptoms were discussed with patient, and patient acknowledged understanding to return to the ED if they noticed these signs and symptoms. Patient was stable upon discharge.  No problems updated.  ALLERGIES: Allergies  Allergen Reactions   Oxycodone Other (See Comments)    abd pain, stomach cramps     PAST MEDICAL HISTORY: Past Medical History:  Diagnosis Date   Alcohol abuse    Anxiety and depression    Bilateral primary osteoarthritis of knee    Carpal tunnel syndrome of right wrist Dx 2015   Cervical stenosis of spine    MRI 10/2021   Essential hypertension    Gallbladder sludge    GERD (gastroesophageal reflux disease) Dx 2007   Hepatic steatosis    History of alcoholic hepatitis    History of CVA (cerebrovascular accident)    05/2021 MRI brain: Mild chronic microvascular ischemic change in the white matter. Small chronic infarct left superior cerebellum.  Also, chronic microhemorrhage, ? 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 Authorizing Provider  escitalopram (LEXAPRO) 20 MG tablet Take 1 tablet (20 mg total) by mouth daily. 02/21/23  Yes Claiborne Rigg, NP  gabapentin (NEURONTIN) 300 MG capsule Take 1 capsule (300 mg total) by mouth 3 (three) times daily. 02/21/23  Yes Claiborne Rigg, NP  hydrOXYzine (VISTARIL) 25 MG capsule Take 1 capsule (25 mg total) by mouth every 8 (eight) hours as needed. For anxiety 05/10/23  Yes Damian Buckles M, PA-C  ketoconazole (NIZORAL) 200 MG tablet Take 1 tablet (200 mg total) by mouth daily. 05/10/23  Yes Dashae Wilcher, Marzella Schlein, PA-C  ondansetron (ZOFRAN)  4 MG tablet Take 1 tablet (4 mg total) by mouth every 6 (six) hours. 04/22/23  Yes Jeanelle Malling, PA  sodium chloride 1 g tablet Take 1 tablet (1 g total) by mouth 2 (two) times daily with a meal. 05/10/23  Yes Geno Sydnor M, PA-C  traZODone (DESYREL) 50 MG tablet Take 1 tablet (50 mg total) by mouth at bedtime as needed for sleep. 11/13/22  Yes Cosby, Toni Amend, MD    ROS: Neg HEENT Neg resp Neg cardiac Neg GU Neg MS Neg neuro  Objective:   Vitals:   05/10/23 1008  BP: 107/71  Pulse: 63  SpO2: 100%  Weight: 142 lb 9.6 oz (64.7 kg)  Height: 5\' 9"  (1.753 m)   Exam General appearance : Awake, alert, not in any distress. Speech Clear. Not toxic looking; very thin and frail appearing HEENT: Atraumatic and Normocephalic, pupils equally reactive to light and accomodation Neck: Supple, no JVD. No cervical lymphadenopathy.  Chest: Good air entry bilaterally, CTAB.  No rales/rhonchi/wheezing CVS: S1 S2 regular, no murmurs.  Abdomen: Bowel sounds present, Non tender and not distended with no gaurding, rigidity or rebound. No hepatomegaly Extremities: B/L Lower Ext shows no edema, both  legs are warm to touch Neurology: Awake alert, and oriented X 3, CN II-XII intact, Non focal Skin: patches of hypopigmented skin on face and body  Data Review Lab Results  Component Value Date   HGBA1C 5.7 (H) 02/21/2023   HGBA1C 6.0 (H) 11/12/2022   HGBA1C 5.6 03/27/2022    Assessment & Plan   1. Loose stools Believe related to malabsorption and malnutrition secondary to alcohol abuse.   - Comprehensive metabolic panel - CBC with Differential/Platelet - TSH  2. Poor appetite Believe related to #4 - Comprehensive metabolic panel - CBC with Differential/Platelet - TSH  3. Vision changes - Ambulatory referral to Ophthalmology  4. 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HCC) Charlotta Newton.org is the website for narcotics anonymous [employer].cy (website) or (971)767-3468 is the  information for alcoholics anonymous Both are free and immediately available for help with alcohol and drug use I have counseled the patient at length about substance abuse and addiction.  12 step meetings/recovery recommended.  Local 12 step meeting lists were given and attendance was encouraged.  Patient expresses understanding.  I also spent time teaching him how to do zoom AA meetings - hydrOXYzine (VISTARIL) 25 MG capsule; Take 1 capsule (25 mg total) by mouth every 8 (eight) hours as needed. For anxiety  Dispense: 60 capsule; Refill: 1 - CBC with Differential/Platelet  5. Tinea versicolor Selsun blue shampoo daily Last LFT ok and has only had alcohol once in last 3 weeks and plans on going to AA and not drinking. - ketoconazole (NIZORAL) 200 MG tablet; Take 1 tablet (200 mg total) by mouth daily.  Dispense: 14 tablet; Refill: 0  6. Hyponatremia - sodium chloride 1 g tablet; Take 1 tablet (1 g total) by mouth 2 (two) times daily with a meal.  Dispense: 30 tablet; Refill: 0  7. Weakness Felt much better after eating.  Encouraged eating every 3 to 4 hours whether hungry or not.  Continue hydration.  Avoid all alcohol - sodium chloride 1 g tablet; Take 1 tablet (1 g total) by mouth 2 (two) times daily with a meal.  Dispense: 30 tablet; Refill: 0    Return in about 2 months (around 07/10/2023) for Zelda (PCP).  The patient was given clear instructions to go to ER or return to medical center if symptoms don't improve, worsen or new problems develop. The patient verbalized understanding. The patient was told to call to get lab results if they haven't heard anything in the next week.      Georgian Co, PA-C [facility] [place], [place] [phone]   05/10/2023, 1:04 PM

## 2023-05-10 ENCOUNTER — Ambulatory Visit: Payer: 59 | Attending: Nurse Practitioner | Admitting: Physician Assistant

## 2023-05-10 ENCOUNTER — Encounter: Payer: Self-pay | Admitting: Physician Assistant

## 2023-05-10 ENCOUNTER — Other Ambulatory Visit (HOSPITAL_COMMUNITY): Payer: Self-pay

## 2023-05-10 ENCOUNTER — Other Ambulatory Visit: Payer: Self-pay

## 2023-05-10 VITALS — BP 107/71 | HR 63 | Ht 69.0 in | Wt 142.6 lb

## 2023-05-10 DIAGNOSIS — R531 Weakness: Secondary | ICD-10-CM | POA: Diagnosis not present

## 2023-05-10 DIAGNOSIS — F1094 Alcohol use, unspecified with alcohol-induced mood disorder: Secondary | ICD-10-CM

## 2023-05-10 DIAGNOSIS — E871 Hypo-osmolality and hyponatremia: Secondary | ICD-10-CM

## 2023-05-10 DIAGNOSIS — B354 Tinea corporis: Secondary | ICD-10-CM

## 2023-05-10 DIAGNOSIS — R630 Anorexia: Secondary | ICD-10-CM | POA: Diagnosis not present

## 2023-05-10 DIAGNOSIS — R195 Other fecal abnormalities: Secondary | ICD-10-CM

## 2023-05-10 DIAGNOSIS — H539 Unspecified visual disturbance: Secondary | ICD-10-CM | POA: Diagnosis not present

## 2023-05-10 MED ORDER — KETOCONAZOLE 200 MG PO TABS
200.0000 mg | ORAL_TABLET | Freq: Every day | ORAL | 0 refills | Status: DC
Start: 2023-05-10 — End: 2023-09-04
  Filled 2023-05-10: qty 14, 14d supply, fill #0

## 2023-05-10 MED ORDER — HYDROXYZINE PAMOATE 25 MG PO CAPS
25.0000 mg | ORAL_CAPSULE | Freq: Three times a day (TID) | ORAL | 1 refills | Status: DC | PRN
Start: 2023-05-10 — End: 2024-01-23
  Filled 2023-05-10: qty 60, 20d supply, fill #0

## 2023-05-10 MED ORDER — SODIUM CHLORIDE 1 G PO TABS
1.0000 g | ORAL_TABLET | Freq: Two times a day (BID) | ORAL | 0 refills | Status: DC
Start: 2023-05-10 — End: 2024-01-23
  Filled 2023-05-10: qty 30, 15d supply, fill #0

## 2023-05-10 NOTE — Patient Instructions (Addendum)
[employer]otta Newton.org is the website for narcotics anonymous [employer]cy (website) or 229-384-0751 is the information for alcoholics anonymous Both are free and immediately available for help with alcohol and drug use Get selsun Blue shampoo and wash your body head to toe with it for 2 weeks every day then once a week.    Tinea Versicolor  Tinea versicolor is a skin infection. It is caused by a type of yeast. It is normal for some yeast to be on your skin, but too much yeast causes this infection. The infection causes a rash of light or dark patches on your skin. The rash is most common on the chest, back, neck, or upper arms. The infection usually does not cause other problems. If it is treated, it will probably go away in a few weeks. The infection cannot be spread from one person to another (is notcontagious). What are the causes? This condition is caused by a certain type of yeast that starts to grow too much on your skin. What increases the risk? Heat and humidity. Sweating too much. Hormone changes. This may happen when taking birth control pills. Oily skin. A weak disease-fighting system (immunesystem). What are the signs or symptoms? A rash of light or dark patches on your skin. The rash may have: Patches of tan or pink spots (on light skin). Patches of white or brown spots (on dark skin). Patches of skin that do not tan. Well-marked edges. Scales. Mild itching. There may also be no itching. How is this treated? Treatment for this condition may include: Dandruff shampoo. The shampoo may be used on the affected skin during showers or baths. Over-the-counter medicated skin cream, lotion, or soaps. Prescription antifungal medicine. This may include cream or pills. Medicine to help your itching. Follow these instructions at home: Use over-the-counter and prescription medicines only as told by your doctor. Wash your skin with dandruff shampoo as told by your doctor. Do not scratch your  skin in the rash area. Avoid places that are hot and humid. Do not use tanning booths. Try to avoid sweating a lot. Contact a doctor if: Your symptoms get worse. You have a fever. You have signs of infection such as: Redness, swelling, or pain in the rash area. Warmth coming from your rash. Fluid or blood coming from your rash. Pus or a bad smell coming from your rash. Your rash comes back (recurs) after treatment. Your rash does not improve with treatment. Your rash spreads to other parts of the body. Summary Tinea versicolor is a skin infection. It causes a rash of light or dark patches on your skin. The rash is most common on the chest, back, neck, or upper arms. This infection usually does not cause other problems. Use over-the-counter and prescription medicines only as told by your doctor. If the infection is treated, it will probably go away in a few weeks. This information is not intended to replace advice given to you by your health care provider. Make sure you discuss any questions you have with your health care provider. Document Revised: 02/01/2021 Document Reviewed: 02/01/2021 Elsevier Patient Education  2024 [employer].

## 2023-05-11 ENCOUNTER — Other Ambulatory Visit: Payer: Self-pay

## 2023-05-11 LAB — COMPREHENSIVE METABOLIC PANEL
ALT: 51 IU/L — ABNORMAL HIGH (ref 0–44)
AST: 78 IU/L — ABNORMAL HIGH (ref 0–40)
Albumin/Globulin Ratio: 1.5
Albumin: 3.9 g/dL (ref 3.9–4.9)
Alkaline Phosphatase: 29 IU/L — ABNORMAL LOW (ref 44–121)
BUN/Creatinine Ratio: 13 (ref 10–24)
BUN: 13 mg/dL (ref 8–27)
Bilirubin Total: 0.4 mg/dL (ref 0.0–1.2)
CO2: 24 mmol/L (ref 20–29)
Calcium: 8.8 mg/dL (ref 8.6–10.2)
Chloride: 100 mmol/L (ref 96–106)
Creatinine, Ser: 1.01 mg/dL (ref 0.76–1.27)
Globulin, Total: 2.6 g/dL (ref 1.5–4.5)
Glucose: 107 mg/dL — ABNORMAL HIGH (ref 70–99)
Potassium: 3.8 mmol/L (ref 3.5–5.2)
Sodium: 137 mmol/L (ref 134–144)
Total Protein: 6.5 g/dL (ref 6.0–8.5)
eGFR: 85 mL/min/{1.73_m2} (ref >59)

## 2023-05-11 LAB — CBC WITH DIFFERENTIAL/PLATELET
Basophils Absolute: 0.1 10*3/uL (ref 0.0–0.2)
Basos: 1 %
EOS (ABSOLUTE): 0.1 10*3/uL (ref 0.0–0.4)
Eos: 1 %
Hematocrit: 34.1 % — ABNORMAL LOW (ref 37.5–51.0)
Hemoglobin: 11 g/dL — ABNORMAL LOW (ref 13.0–17.7)
Immature Grans (Abs): 0 10*3/uL (ref 0.0–0.1)
Immature Granulocytes: 0 %
Lymphocytes Absolute: 2.5 10*3/uL (ref 0.7–3.1)
Lymphs: 45 %
MCH: 26.6 pg (ref 26.6–33.0)
MCHC: 32.3 g/dL (ref 31.5–35.7)
MCV: 83 fL (ref 79–97)
Monocytes Absolute: 0.8 10*3/uL (ref 0.1–0.9)
Monocytes: 14 %
Neutrophils Absolute: 2.1 10*3/uL (ref 1.4–7.0)
Neutrophils: 39 %
Platelets: 117 10*3/uL — ABNORMAL LOW (ref 150–450)
RBC: 4.13 x10E6/uL — ABNORMAL LOW (ref 4.14–5.80)
RDW: 14.4 % (ref 11.6–15.4)
WBC: 5.5 10*3/uL (ref 3.4–10.8)

## 2023-05-11 LAB — TSH: TSH: 1.16 u[IU]/mL (ref 0.450–4.500)

## 2023-05-18 ENCOUNTER — Telehealth: Payer: Self-pay

## 2023-05-18 NOTE — Telephone Encounter (Signed)
-----   Message from Ronette Deter, [place] sent at 05/17/2023  1:47 PM EDT -----  ----- Message ----- From: Bary Richard Sent: 05/14/2023  10:48 AM EDT To: Guy Franco, RN  Please call patient. Avoid aspirin bc platelets are low. Liver function is elevated. Avoid alcohol and Tylenol. Eat foods with iron in them as your hemoglobin is a little low. Thyroid is normal. Thanks, Georgian Co

## 2023-05-18 NOTE — Telephone Encounter (Signed)
Pt was called and is aware of results, DOB was confirmed.   Patient stated that he will take iron supplements as well

## 2023-06-10 DIAGNOSIS — I442 Atrioventricular block, complete: Secondary | ICD-10-CM | POA: Diagnosis not present

## 2023-06-10 DIAGNOSIS — F1010 Alcohol abuse, uncomplicated: Secondary | ICD-10-CM | POA: Diagnosis not present

## 2023-06-10 DIAGNOSIS — F10239 Alcohol dependence with withdrawal, unspecified: Secondary | ICD-10-CM | POA: Diagnosis not present

## 2023-06-10 DIAGNOSIS — F411 Generalized anxiety disorder: Secondary | ICD-10-CM | POA: Diagnosis not present

## 2023-06-10 DIAGNOSIS — R64 Cachexia: Secondary | ICD-10-CM | POA: Diagnosis not present

## 2023-06-10 DIAGNOSIS — F10139 Alcohol abuse with withdrawal, unspecified: Secondary | ICD-10-CM | POA: Diagnosis not present

## 2023-06-10 DIAGNOSIS — F10930 Alcohol use, unspecified with withdrawal, uncomplicated: Secondary | ICD-10-CM | POA: Diagnosis not present

## 2023-06-10 DIAGNOSIS — Z885 Allergy status to narcotic agent status: Secondary | ICD-10-CM | POA: Diagnosis not present

## 2023-06-10 DIAGNOSIS — F32A Depression, unspecified: Secondary | ICD-10-CM | POA: Diagnosis not present

## 2023-06-11 DIAGNOSIS — F32A Depression, unspecified: Secondary | ICD-10-CM | POA: Diagnosis not present

## 2023-06-11 DIAGNOSIS — F10230 Alcohol dependence with withdrawal, uncomplicated: Secondary | ICD-10-CM | POA: Diagnosis not present

## 2023-06-11 DIAGNOSIS — Z885 Allergy status to narcotic agent status: Secondary | ICD-10-CM | POA: Diagnosis not present

## 2023-06-11 DIAGNOSIS — F411 Generalized anxiety disorder: Secondary | ICD-10-CM | POA: Diagnosis not present

## 2023-06-11 DIAGNOSIS — F10139 Alcohol abuse with withdrawal, unspecified: Secondary | ICD-10-CM | POA: Diagnosis not present

## 2023-06-11 DIAGNOSIS — Z79899 Other long term (current) drug therapy: Secondary | ICD-10-CM | POA: Diagnosis not present

## 2023-06-11 DIAGNOSIS — F1020 Alcohol dependence, uncomplicated: Secondary | ICD-10-CM | POA: Diagnosis not present

## 2023-06-18 DIAGNOSIS — F1020 Alcohol dependence, uncomplicated: Secondary | ICD-10-CM | POA: Diagnosis not present

## 2023-06-20 ENCOUNTER — Telehealth: Payer: Self-pay

## 2023-06-20 NOTE — Transitions of Care (Post Inpatient/ED Visit) (Signed)
06/20/2023  Name: [name] [name] MRN: [id number] DOB: [birth year]  Today's TOC FU Call Status: Today's TOC FU Call Status:: Successful TOC FU Call Competed TOC FU Call Complete Date: 06/20/23  Transition Care Management Follow-up Telephone Call Date of Discharge: 06/18/23 Discharge Facility: Other ([facility]) Name of Other ([facility]) Discharge Facility: [facility] Type of Discharge: Inpatient Admission Primary Inpatient Discharge Diagnosis:: Alcohol Withdrawl How have you been since you were released from the hospital?: Better Any questions or concerns?: No  Items Reviewed: Did you receive and understand the discharge instructions provided?: Yes Medications obtained,verified, and reconciled?: No Medications Not Reviewed Reasons:: Other: Any new allergies since your discharge?: No Dietary orders reviewed?: No Do you have support at home?: Yes People in Home: parent(s) Name of Support/Comfort Primary Source: Eber Jones  Medications Reviewed Today: Medications Reviewed Today     Reviewed by Jodelle Gross, RN (Case Manager) on 06/20/23 at 1246  Med List Status: <None>   Medication Order Taking? Sig Documenting Provider Last Dose Status Informant  escitalopram (LEXAPRO) 20 MG tablet [id number] No Take 1 tablet (20 mg total) by mouth daily. Claiborne Rigg, NP Unknown Active            Med Note Electa Sniff, [facility]   Wed Jun 20, 2023 12:46 PM) Unable to complete TOC medication review  gabapentin (NEURONTIN) 300 MG capsule [id number]  Take 1 capsule (300 mg total) by mouth 3 (three) times daily. Claiborne Rigg, NP  Active   hydrOXYzine (VISTARIL) 25 MG capsule [id number]  Take 1 capsule (25 mg total) by mouth every 8 (eight) hours as needed. For anxiety Anders Simmonds, [place]  Active   ketoconazole (NIZORAL) 200 MG tablet [id number]  Take 1 tablet (200 mg total) by mouth daily. Georgian Co M, PA-C  Active   ondansetron (ZOFRAN) 4 MG tablet [id number]  Take 1 tablet (4 mg  total) by mouth every 6 (six) hours. Jeanelle Malling, PA  Active   sodium chloride 1 g tablet [id number]  Take 1 tablet (1 g total) by mouth 2 (two) times daily with a meal. Anders Simmonds, PA-C  Active   traZODone (DESYREL) 50 MG tablet [id number]  Take 1 tablet (50 mg total) by mouth at bedtime as needed for sleep. Lamar Sprinkles, MD  Active             Home Care and Equipment/Supplies: Were Home Health Services Ordered?: No Any new equipment or medical supplies ordered?: No  Functional Questionnaire: Do you need assistance with bathing/showering or dressing?: No Do you need assistance with meal preparation?: No Do you need assistance with eating?: No Do you have difficulty maintaining continence: No Do you need assistance with getting out of bed/getting out of a chair/moving?: No Do you have difficulty managing or taking your medications?: No  Follow up appointments reviewed: PCP Follow-up appointment confirmed?: Yes Date of PCP follow-up appointment?: 07/04/23 Follow-up Provider: Georgian Co Specialist [facility] Follow-up appointment confirmed?: NA Do you need transportation to your follow-up appointment?: No Do you understand care options if your condition(s) worsen?: Yes-patient verbalized understanding  SDOH Interventions Today    Flowsheet Row Most Recent Value  SDOH Interventions   Food Insecurity Interventions Intervention Not Indicated  Utilities Interventions Intervention Not Indicated      TOC Interventions Today    Flowsheet Row Most Recent Value  TOC Interventions   TOC Interventions Discussed/Reviewed TOC Interventions Discussed, TOC Interventions Reviewed  [Provided phone number for care coordinator as patient is considering LCSW appointment.]  Jodelle Gross, RN, BSN, CCM Care Management Coordinator [place] Phone: [phone]/Fax: [phone]

## 2023-06-21 ENCOUNTER — Other Ambulatory Visit: Payer: Self-pay

## 2023-06-22 ENCOUNTER — Other Ambulatory Visit: Payer: Self-pay

## 2023-06-27 ENCOUNTER — Telehealth: Payer: Self-pay | Admitting: *Deleted

## 2023-06-27 NOTE — Progress Notes (Signed)
  Care Coordination  Outreach Note  06/27/2023 Name: [name] [name] MRN: [id number] DOB: Nov 06, 1961   Care Coordination Outreach Attempts: An unsuccessful telephone outreach was attempted today to offer the patient information about available care coordination services.  Follow Up Plan:  Additional outreach attempts will be made to offer the patient care coordination information and services.   Encounter Outcome:  No Answer  Christie Nottingham  Care Coordination Care Guide  Direct Dial: [phone]

## 2023-07-03 NOTE — Progress Notes (Unsigned)
  Care Coordination  Outreach Note  07/03/2023 Name: Ulice Czech MRN: [id number] DOB: 1961/08/15   Care Coordination Outreach Attempts: A second unsuccessful outreach was attempted today to offer the patient with information about available care coordination services.  Follow Up Plan:  Additional outreach attempts will be made to offer the patient care coordination information and services.   Encounter Outcome:  No Answer   Gwenevere Ghazi  Care Coordination Care Guide  Direct Dial: [phone]

## 2023-07-04 ENCOUNTER — Inpatient Hospital Stay: Payer: 59 | Admitting: Physician Assistant

## 2023-07-04 NOTE — Progress Notes (Signed)
  Care Coordination  Outreach Note  07/04/2023 Name: [name] [name] MRN: [id number] DOB: 24-Apr-1961   Care Coordination Outreach Attempts: A third unsuccessful outreach was attempted today to offer the patient with information about available care coordination services.  Follow Up Plan:  No further outreach attempts will be made at this time. We have been unable to contact the patient to offer or enroll patient in care coordination services  Encounter Outcome:  No Answer   Gwenevere Ghazi  Care Coordination Care Guide  Direct Dial: [phone]

## 2023-07-19 ENCOUNTER — Encounter (HOSPITAL_COMMUNITY): Payer: Self-pay

## 2023-07-19 ENCOUNTER — Other Ambulatory Visit: Payer: Self-pay

## 2023-07-19 ENCOUNTER — Emergency Department (HOSPITAL_COMMUNITY)
Admission: EM | Admit: 2023-07-19 | Discharge: 2023-07-20 | Disposition: A | Discharge status: Home / Self Care | Payer: 59 | Attending: Emergency Medicine | Admitting: Emergency Medicine

## 2023-07-19 DIAGNOSIS — Y908 Blood alcohol level of 240 mg/100 ml or more: Secondary | ICD-10-CM | POA: Diagnosis not present

## 2023-07-19 DIAGNOSIS — F10120 Alcohol abuse with intoxication, uncomplicated: Secondary | ICD-10-CM | POA: Diagnosis not present

## 2023-07-19 DIAGNOSIS — F10920 Alcohol use, unspecified with intoxication, uncomplicated: Secondary | ICD-10-CM | POA: Diagnosis not present

## 2023-07-19 DIAGNOSIS — F1020 Alcohol dependence, uncomplicated: Secondary | ICD-10-CM | POA: Diagnosis not present

## 2023-07-19 DIAGNOSIS — I10 Essential (primary) hypertension: Secondary | ICD-10-CM | POA: Insufficient documentation

## 2023-07-19 DIAGNOSIS — F10129 Alcohol abuse with intoxication, unspecified: Secondary | ICD-10-CM | POA: Diagnosis not present

## 2023-07-19 LAB — CBC WITH DIFFERENTIAL/PLATELET
Abs Immature Granulocytes: 0.02 10*3/uL (ref 0.00–0.07)
Basophils Absolute: 0 10*3/uL (ref 0.0–0.1)
Basophils Relative: 1 %
Eosinophils Absolute: 0.1 10*3/uL (ref 0.0–0.5)
Eosinophils Relative: 1 %
HCT: 45.3 % (ref 39.0–52.0)
Hemoglobin: 14.6 g/dL (ref 13.0–17.0)
Immature Granulocytes: 0 %
Lymphocytes Relative: 26 %
Lymphs Abs: 1.5 10*3/uL (ref 0.7–4.0)
MCH: 26 pg (ref 26.0–34.0)
MCHC: 32.2 g/dL (ref 30.0–36.0)
MCV: 80.7 fL (ref 80.0–100.0)
Monocytes Absolute: 0.4 10*3/uL (ref 0.1–1.0)
Monocytes Relative: 7 %
Neutro Abs: 3.6 10*3/uL (ref 1.7–7.7)
Neutrophils Relative %: 65 %
Platelets: 206 10*3/uL (ref 150–400)
RBC: 5.61 MIL/uL (ref 4.22–5.81)
RDW: 14.6 % (ref 11.5–15.5)
WBC: 5.5 10*3/uL (ref 4.0–10.5)
nRBC: 0 % (ref 0.0–0.2)

## 2023-07-19 LAB — COMPREHENSIVE METABOLIC PANEL
ALT: 16 U/L (ref 0–44)
AST: 33 U/L (ref 15–41)
Albumin: 4.6 g/dL (ref 3.5–5.0)
Alkaline Phosphatase: 45 U/L (ref 38–126)
Anion gap: 13 (ref 5–15)
BUN: 5 mg/dL — ABNORMAL LOW (ref 8–23)
CO2: 26 mmol/L (ref 22–32)
Calcium: 9 mg/dL (ref 8.9–10.3)
Chloride: 94 mmol/L — ABNORMAL LOW (ref 98–111)
Creatinine, Ser: 0.93 mg/dL (ref 0.61–1.24)
GFR, Estimated: >60 mL/min (ref >60)
Glucose, Bld: 84 mg/dL (ref 70–99)
Potassium: 3.5 mmol/L (ref 3.5–5.1)
Sodium: 133 mmol/L — ABNORMAL LOW (ref 135–145)
Total Bilirubin: 0.8 mg/dL (ref 0.3–1.2)
Total Protein: 9.5 g/dL — ABNORMAL HIGH (ref 6.5–8.1)

## 2023-07-19 LAB — ETHANOL: Alcohol, Ethyl (B): 320 mg/dL (ref <10)

## 2023-07-19 LAB — LIPASE, BLOOD: Lipase: 48 U/L (ref 11–51)

## 2023-07-19 MED ORDER — LORAZEPAM 1 MG PO TABS
1.0000 mg | ORAL_TABLET | ORAL | Status: DC | PRN
  Administered 2023-07-19 – 2023-07-20 (×8): 1 mg via ORAL
  Filled 2023-07-19 (×8): qty 1

## 2023-07-19 NOTE — ED Triage Notes (Signed)
EMS reports from home. Neighbor called for ETOH abuse (Pt states he drank a 6-pack an hour ago). Pt c/o shakiness and anxiety N/V and abdominal pain. Pt states he's here for withdrawal.  BP 126/100 HR 105 RR 20 Sp02 96 RA CBG 115

## 2023-07-19 NOTE — Discharge Instructions (Addendum)
Your labs yesterday did not show any acute emergent findings.  Attached is information for resources for alcohol detox. You are being discharged with a prescription for a Librium taper for alcohol withdrawal, please take as prescribed. Follow-up with your primary care provider regarding today's ED visit.  Return to emergency department if you are experiencing increasing/worsening symptoms.

## 2023-07-19 NOTE — ED Notes (Signed)
Pt ambulated to restroom with steady gait.

## 2023-07-19 NOTE — ED Provider Notes (Signed)
[place] EMERGENCY DEPARTMENT AT [facility] Provider Note   CSN: [id number] Arrival date & time: 07/19/23  1846     History  Chief Complaint  Patient presents with   Alcohol Problem    [name] [name] is a 62 y.o. male with a past medical history hypertension, alcohol abuse who presents emergency department brought in by EMS with concerns for alcohol problem.  Patient is requesting detox at this time.  Notes that he drinks as much as he can get his hands on throughout the day however notes that he drank a sixpack of beer prior to arrival to the ED.  Has concerns for increased shaking and anxiety.  Notes that he has the shaking when he feels increase in anxiety.  Also notes nausea, vomiting, epigastric abdominal pain.  He has had epigastric abdominal pain for several years.  Denies previous history of alcohol withdrawal seizures, DTs.  The history is provided by the patient. No language interpreter was used.       Home Medications Prior to Admission medications   Medication Sig Start Date End Date Taking? Authorizing Provider  escitalopram (LEXAPRO) 20 MG tablet Take 1 tablet (20 mg total) by mouth daily. 02/21/23   Claiborne Rigg, NP  gabapentin (NEURONTIN) 300 MG capsule Take 1 capsule (300 mg total) by mouth 3 (three) times daily. 02/21/23   Claiborne Rigg, NP  hydrOXYzine (VISTARIL) 25 MG capsule Take 1 capsule (25 mg total) by mouth every 8 (eight) hours as needed. For anxiety 05/10/23   Anders Simmonds, PA-C  ketoconazole (NIZORAL) 200 MG tablet Take 1 tablet (200 mg total) by mouth daily. 05/10/23   Anders Simmonds, PA-C  ondansetron (ZOFRAN) 4 MG tablet Take 1 tablet (4 mg total) by mouth every 6 (six) hours. 04/22/23   Jeanelle Malling, PA  sodium chloride 1 g tablet Take 1 tablet (1 g total) by mouth 2 (two) times daily with a meal. 05/10/23   McClung, Marzella Schlein, PA-C  traZODone (DESYREL) 50 MG tablet Take 1 tablet (50 mg total) by mouth at bedtime as needed for sleep.  11/13/22   Lamar Sprinkles, MD      Allergies    Oxycodone    Review of Systems   Review of Systems  All other systems reviewed and are negative.   Physical Exam Updated Vital Signs BP (!) 128/98   Pulse (!) 105   Temp 97.9 F (36.6 C) (Oral)   Resp 16   SpO2 100%  Physical Exam Vitals and nursing note reviewed.  Constitutional:      General: He is not in acute distress.    Appearance: He is not diaphoretic.  HENT:     Head: Normocephalic and atraumatic.     Mouth/Throat:     Pharynx: No oropharyngeal exudate.  Eyes:     General: No scleral icterus.    Conjunctiva/sclera: Conjunctivae normal.  Cardiovascular:     Rate and Rhythm: Normal rate and regular rhythm.     Pulses: Normal pulses.     Heart sounds: Normal heart sounds.  Pulmonary:     Effort: Pulmonary effort is normal. No respiratory distress.     Breath sounds: Normal breath sounds. No wheezing.  Abdominal:     General: Bowel sounds are normal.     Palpations: Abdomen is soft. There is no mass.     Tenderness: There is no abdominal tenderness. There is no guarding or rebound.  Musculoskeletal:  General: Normal range of motion.     Cervical back: Normal range of motion and neck supple.  Skin:    General: Skin is warm and dry.  Neurological:     Mental Status: He is alert.  Psychiatric:        Behavior: Behavior normal.     ED Results / Procedures / Treatments   Labs (all labs ordered are listed, but only abnormal results are displayed) Labs Reviewed  COMPREHENSIVE METABOLIC PANEL - Abnormal; Notable for the following components:      Result Value   Sodium 133 (*)    Chloride 94 (*)    BUN 5 (*)    Total Protein 9.5 (*)    All other components within normal limits  ETHANOL - Abnormal; Notable for the following components:   Alcohol, Ethyl (B) 320 (*)    All other components within normal limits  CBC WITH DIFFERENTIAL/PLATELET  LIPASE, BLOOD    EKG None  Radiology No results  found.  Procedures Procedures    Medications Ordered in ED Medications  LORazepam (ATIVAN) tablet 1-4 mg (1 mg Oral Given 07/19/23 2030)    ED Course/ Medical Decision Making/ A&P Clinical Course as of 07/19/23 2159  Thu Jul 19, 2023  2054 Alcohol, Ethyl (B)(!!): 320 [SB]    Clinical Course User Index [SB] Korin Setzler A, PA-C                                 Medical Decision Making Amount and/or Complexity of Data Reviewed Labs: ordered. Decision-making details documented in ED Course.  Risk Prescription drug management.   Pt presents with concerns for alcohol problem.  He is requesting detox at this time.  He reports he drinks as much as he can get his hands on throughout the day.  Endorses a sixpack of beer prior to arrival to the ED.  Patient afebrile, tachycardic initially on exam.  On exam patient without cardiovascular, aspiratory, does not findings.    Co morbidities that complicate the patient evaluation: Hypertension, alcohol abuse   Labs:  I ordered, and personally interpreted labs.  The pertinent results include:   Lipase unremarkable CBC unremarkable CMP unremarkable EtOH elevated at 320  Medications:  I ordered medication including Ativan for symptom management I have reviewed the patients home medicines and have made adjustments as needed   Patient case discussed with Army Melia, PA-C at sign-out. Plan at sign-out is pending [facility] consult. Disposition as per oncoming team. However, plans may change as per oncoming team. Patient care transferred at sign out.     This chart was dictated using voice recognition software, Dragon. Despite the best efforts of this provider to proofread and correct errors, errors may still occur which can change documentation meaning.  Final Clinical Impression(s) / ED Diagnoses Final diagnoses:  Alcoholic intoxication without complication [facility])    Rx / DC Orders ED Discharge Orders     None         Silvie Obremski,  Shaya Reddick A, PA-C 07/19/23 2211    Glyn Ade, MD 07/19/23 2345

## 2023-07-19 NOTE — Progress Notes (Signed)
Transition of Care [facility]) - Emergency Department Mini Assessment   Patient Details  Name: [name] [name] MRN: [id number] Date of Birth: [birth year]  Transition of Care [facility]) CM/SW Contact:    Georgie Chard, LCSW Phone Number: 07/19/2023, 9:36 PM   Clinical Narrative: CSW met patient at bedside to discuss options for rehab. AT this time patient would like rehab. This CSW has reached out to out Disposition to see if the patient qualify for rehab. This CSW has added resources if patient does not qualify patient will DC and follow up with OP/ IP rehabs. CSW has informed nurse about bus pass for the patient.    ED Mini Assessment: What brought you to the Emergency Department? : (P) Detox  Barriers to Discharge: (P) No Barriers Identified  Barrier interventions: (P) Patient needs rehab  Means of departure: (P) Public Transportation  Interventions which prevented an admission or readmission: (P) SUD counseling    Patient Contact and Communications     Spoke with: (P) Patient Contact Date: (P) 07/19/23,          Patient states their goals for this hospitalization and ongoing recovery are:: (P) Patient would like rehab      Admission diagnosis:  ETOH W/D Patient Active Problem List   Diagnosis Date Noted   Alcohol use disorder, severe, dependence (HCC) 11/11/2022   Alcohol use disorder, severe, in early remission (HCC) 11/10/2022   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   Alcohol abuse with intoxication (HCC) 03/27/2022   Neuropathy 12/30/2021   Alcohol dependence with alcohol-induced mood disorder (HCC) 12/27/2021   Uncomplicated alcohol dependence (HCC) 06/16/2021   Essential hypertension 04/07/2021   Prediabetes 04/07/2021   Other hyperlipidemia 04/07/2021   Major depressive disorder, severe (HCC) 03/16/2021   History of hyperlipidemia 12/21/2020   Seborrheic dermatitis of scalp 12/21/2020   Substance abuse (HCC) 12/21/2020   Hyperopia of both eyes with  astigmatism and presbyopia 12/20/2020   Posterior vitreous detachment of right eye 12/20/2020   MDD (major depressive disorder), recurrent episode, severe (HCC) 06/30/2017   Lateral epicondylitis of right elbow 01/05/2017   Strain, MCP, hand, right, initial encounter 01/05/2017   Vasomotor rhinitis 02/13/2016   Bilateral chronic knee pain 01/11/2016   Chronic pain of right wrist 07/07/2015   Carpal tunnel syndrome of right wrist 05/20/2015   Dislocation of right shoulder joint 05/20/2015   Tear of medial meniscus of right knee 03/24/2015   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HCC) 01/27/2015   Chronic cough 01/15/2015   Onychomycosis of toenail 12/15/2014   Dermatitis of face 11/11/2014   GERD (gastroesophageal reflux disease)    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HCC) 01/23/2014   Alcohol abuse 01/19/2014   Alcohol dependence (HCC) 09/02/2013   Generalized anxiety disorder 09/02/2013   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   PCP:  Claiborne Rigg, NP Pharmacy:   [facility] [address], [address] [place] [address] Phone: [phone] Fax: [phone]  [id number] [place], [place] [address] [place] [address] Phone: [phone] Fax: [phone]

## 2023-07-20 ENCOUNTER — Ambulatory Visit: Payer: 59 | Admitting: Nurse Practitioner

## 2023-07-20 DIAGNOSIS — F10920 Alcohol use, unspecified with intoxication, uncomplicated: Secondary | ICD-10-CM | POA: Diagnosis not present

## 2023-07-20 MED ORDER — CHLORDIAZEPOXIDE HCL 25 MG PO CAPS
ORAL_CAPSULE | ORAL | 0 refills | Status: DC
  Filled 2023-07-20: qty 10, 2d supply, fill #0

## 2023-07-20 MED ORDER — GABAPENTIN 300 MG PO CAPS
300.0000 mg | ORAL_CAPSULE | Freq: Three times a day (TID) | ORAL | Status: DC
  Administered 2023-07-20 (×3): 300 mg via ORAL
  Filled 2023-07-20 (×3): qty 1

## 2023-07-20 MED ORDER — ESCITALOPRAM OXALATE 10 MG PO TABS
20.0000 mg | ORAL_TABLET | Freq: Every day | ORAL | Status: DC
  Administered 2023-07-20: 20 mg via ORAL
  Filled 2023-07-20: qty 2

## 2023-07-20 MED ORDER — TRAZODONE HCL 50 MG PO TABS: 50.0000 mg | ORAL_TABLET | Freq: Every evening | ORAL | Status: DC | PRN

## 2023-07-20 MED ORDER — ONDANSETRON HCL 4 MG PO TABS
4.0000 mg | ORAL_TABLET | Freq: Four times a day (QID) | ORAL | Status: DC
  Administered 2023-07-20: 4 mg via ORAL
  Filled 2023-07-20: qty 1

## 2023-07-20 MED ORDER — SODIUM CHLORIDE 1 G PO TABS
1.0000 g | ORAL_TABLET | Freq: Two times a day (BID) | ORAL | Status: DC
  Administered 2023-07-20: 1 g via ORAL
  Filled 2023-07-20 (×2): qty 1

## 2023-07-20 NOTE — Progress Notes (Addendum)
07/20/23 2:38a-Per Night [facility] AC Hart [place], RN there are availability for pt to transfer at this time. Pt's information will be added to [facility] report for follow up.    07/19/23-TOC CSW Tunisha Meban,LCSW reached out to this CSW to obtain insight for pt to be reviewed for [facility] Facility Based Crisis.  Address: [address] [place], [place] [address]   Care Team notified: Chinwendu Letitia Neri [place], Tatiana Hampton,RN, [place], Army Melia, PA-C, Night [facility] Lynnea Maizes, Soijett Radley, PA-C  Maryjean Ka, MSW, [facility] 07/20/2023 2:34 AM

## 2023-07-20 NOTE — ED Provider Notes (Signed)
Was called evaluate the patient before he was transferred to the [facility].  Patient states that he does not want a be transferred there anymore.  He states that when he came in yesterday and said he wanted detox he was just drunk.  He states that his family has been "on him" recently to decrease his drinking and he thought if he could be seen by detox facility while he was here that would be helpful.  He does not want to be transferred to a detox facility.  Patient is ANO x 4.  He denies any SI, HI, AH, VH.  He states that he does not think he is in alcohol withdrawal.  Furthermore he did receive 1 mg of p.o. Ativan at 9:30 PM.  He does not currently have any tongue fasciculations, tremors, nausea vomiting, headache, anxiety, photophobia, sweating.   He states he knows what to look for for withdrawal and will return if he has withdrawal symptoms. He had been having CIWA ~5 while in ED and receiving ativan so will DC w/ Librium taper. He denies any physical pain and he is hemodynamically stable.  Patient has capacity and and presented here voluntarily.  He is able to refuse and would like to be discharged.  Patient will be discharged given resources   Loetta Rough, MD 07/20/23 2259

## 2023-07-20 NOTE — ED Notes (Signed)
Patient denies pain and is resting comfortably.  

## 2023-07-20 NOTE — ED Notes (Signed)
Pt resting in bed.

## 2023-07-20 NOTE — ED Provider Notes (Signed)
Emergency Medicine Observation Re-evaluation Note  [name] [name] is a 62 y.o. male, seen on rounds today.  Pt initially presented to the ED for complaints of Alcohol Problem   Physical Exam  BP 119/86   Pulse (!) 102   Temp 98.6 F (37 C) (Oral)   Resp 18   SpO2 98%  Physical Exam General: NAD Cardiac: mild tachycardia Lungs: normal effort Psych: calm, awake  ED Course / MDM  EKG:   I have reviewed the labs performed to date as well as medications administered while in observation.  Recent changes in the last 24 hours include initial eval..  Plan  Current plan is for TTS assessment.  Hx alcohol use disorder, elevated etoh today.      Linwood Dibbles, MD 07/20/23 [phone]

## 2023-07-20 NOTE — Consult Note (Signed)
[facility] ED ASSESSMENT   Reason for Consult: Psych Consult Referring Physician:  Army Melia, PA-C Patient Identification: [name] [name] MRN:  [id number] ED Chief Complaint: Alcohol dependence [facility])  Diagnosis:  Principal Problem:   Alcohol dependence [facility])   ED Assessment Time Calculation: Start Time: 0945 Stop Time: 1025 Total Time in Minutes (Assessment Completion): 40   Subjective: [name] [name] is a 62 y.o. male patient admitted with a past medical history hypertension, alcohol abuse who presents emergency department brought in by EMS with concerns for alcohol problem.   HPI:  [name] [name], 62 y.o., male patient seen face to face by this provider, consulted with Dr. Clovis Riley; and chart reviewed on 07/20/23.  On evaluation [name] [name] reports that he has anxiety and an issue with alcohol.  Patient states he is here because he is ready to get help with alcohol detox.  Patient states that he has been drinking about 30 years, says now lately he has been drinking mainly beer daily and can almost drink a 12 case of beer.  He states his last drink was yesterday, says his longest sober days have been about 90 days which was this year 2024, said that he was able to do it on his own by distraction, and staying busy.  Patient states that he thinks he was in a detox facility about 2 months ago in [facility], felt that it helped but convinced himself when he left that he was able to have a beer on the weekends.  Patient currently denies SI/HI/AVH, states that he lives with a family friend, and at this time he does not work.  Patient states appetite and sleep have been poor due to his alcohol abuse.  States that his motivation to get help is because of his health, his readiness to stop, and states that "I am not getting any younger."He states that he has never completed 12 step program, has been inconsistent with it, but when they would like to complete a program.  Patient denies using any other illicit  substances, only endorses using alcohol.  UDS is needed, BAL is 320.  Patient states the medications that he is on is Lexapro, Neurontin, Vistaril, and trazodone says that he gets his medications prescribed by his primary care physician Dr. Meredeth Ide.  Patient states that he does have peripheral neuropathy, but is ambulatory and does not use a cane or walker, states he is able to complete his ADLs with no assistance. He denies any history of alcohol withdrawal seizures or DT's.   Per chart review patient  has a past psychiatric history of MDD, GAD, and alcohol use disorder. He was admitted to the [facility] from 12/27/2021 to 12/30/2021. He has no outpatient psychiatric services in place.    During evaluation [name] [name] is observed laying in his hospital gurney, in no acute distress. His speech is slurred. His is alert, oriented x 4, and cooperative. He is irritable while waiting, states he needs some food and water. He is inpatient but attentive. He endorses depression with increased feelings of hopelessness, low self esteem, decreased appetite. Low energy  and sleep.  PHQ9 Score is 13.  Objectively there is no evidence of psychosis/mania or delusional thinking.  He denies AVH. He also denies suicidal/self-harm/homicidal ideation, psychosis, and paranoia.      Past Psychiatric History: anxiety and depression  Risk to Self or Others: Risk to Self:  No Risk to Others:  No  Prior Inpatient Therapy:  Yes  Prior Outpatient Therapy:  No   [place] Scale:  Flowsheet Row ED from 07/19/2023 in [facility] Emergency Department at [facility] ED from 04/22/2023 in [facility] Emergency Department at [facility] ED from 11/13/2022 in [facility] Emergency Department at [facility]  C-SSRS RISK CATEGORY No Risk No Risk No Risk       AIMS:  , , ,  ,   ASAM:    Substance Abuse:     Past Medical History:  Past Medical History:  Diagnosis Date   Alcohol abuse    Anxiety and depression     Bilateral primary osteoarthritis of knee    Carpal tunnel syndrome of right wrist Dx 2015   Cervical stenosis of spine    MRI 10/2021   Essential hypertension    Gallbladder sludge    GERD (gastroesophageal reflux disease) Dx 2007   Hepatic steatosis    History of alcoholic hepatitis    History of CVA (cerebrovascular accident)    05/2021 MRI brain: Mild chronic microvascular ischemic change in the white matter. Small chronic infarct left superior cerebellum.  Also, chronic microhemorrhage, ? 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   Sarcoidosis of lung (HCC) Dx 1989   Seborrheic dermatitis     Past Surgical History:  Procedure Laterality Date   carpel tunnel release Right 07/28/2014    done in [place]    ELBOW SURGERY     KNEE ARTHROSCOPY     KNEE SURGERY Left    15 years ago   LIPOMA EXCISION N/A 05/10/2016   Procedure: EXCISION OF SCALP LIPOMA;  Surgeon: [name], MD;  Location: [facility];  Service: General;  Laterality: N/A;   Family History:  Family History  Problem Relation Age of Onset   Diabetes Father    Hypertension Father    Hypertension Paternal Grandfather    Alcohol abuse Paternal Grandfather    Alcohol abuse Maternal Grandfather    Alcohol abuse Maternal Grandmother    Alcohol abuse Paternal Grandmother    Colon cancer Neg Hx    Rectal cancer Neg Hx    Stomach cancer Neg Hx    Bipolar disorder Neg Hx    Depression Neg Hx     Social History:  Social History   Substance and Sexual Activity  Alcohol Use Not Currently   Comment: clean for 4 months     Social History   Substance and Sexual Activity  Drug Use Not Currently   Types: Marijuana   Comment: Patient denies     Social History    Socioeconomic History   Marital status: Legally Separated    Spouse name: Not on file   Number of children: 2   Years of education: 13 yrs   Highest education level: Not on file  Occupational History   Occupation: custodian    Comment: [employer]  Tobacco Use   Smoking status: Never   Smokeless tobacco: Never  Vaping Use   Vaping status: Never Used  Substance and Sexual Activity   Alcohol use: Not Currently    Comment: clean for 4 months   Drug use: Not Currently    Types: Marijuana    Comment: Patient denies  Sexual activity: Not Currently  Other Topics Concern   Not on file  Social History Narrative   Born and raised in [place], [place] by both parents. He has 2 sisters and is the middle child. He remains close with family. He was married for 16 yrs and seperated in 2005. He has 2 boys who are in their 20's now. He lives alone in [place]. He is working as a Clinical cytogeneticist handed    Lives in a one story home    Social Determinants of Corporate investment banker Strain: Not on File (07/22/2018)   Received from [employer] [employer]    Financial Resource Strain: 0  Food Insecurity: No Food Insecurity (06/20/2023)   Hunger Vital Sign    Worried About Running Out of Food in the Last Year: Never true    Ran Out of Food in the Last Year: Never true  Transportation Needs: Unknown (06/18/2023)   Received from [facility] - Transportation    Lack of Transportation (Medical): Not on file    Lack of Transportation (Non-Medical): No  Physical Activity: Not on File (07/22/2018)   Received from [place], [place]   Physical Activity    Physical Activity: 0  Stress: No Stress Concern Present (06/11/2023)   Received from [facility] - Occupational Stress Questionnaire    Feeling of Stress : Not at all  Social Connections: Unknown (08/14/2022)   Received from [facility]   Social Network     Social Network: Not on file      Allergies:   Allergies  Allergen Reactions   Oxycodone Other (See Comments)    abd pain, stomach cramps     Labs:  Results for orders placed or performed during the hospital encounter of 07/19/23 (from the past 48 hour(s))  Comprehensive metabolic panel     Status: Abnormal   Collection Time: 07/19/23  7:49 PM  Result Value Ref Range   Sodium 133 (L) 135 - 145 mmol/L   Potassium 3.5 3.5 - 5.1 mmol/L   Chloride 94 (L) 98 - 111 mmol/L   CO2 26 22 - 32 mmol/L   Glucose, Bld 84 70 - 99 mg/dL    Comment: Glucose reference range applies only to samples taken after fasting for at least 8 hours.   BUN 5 (L) 8 - 23 mg/dL   Creatinine, Ser 1.61 0.61 - 1.24 mg/dL   Calcium 9.0 8.9 - 09.6 mg/dL   Total Protein 9.5 (H) 6.5 - 8.1 g/dL   Albumin 4.6 3.5 - 5.0 g/dL   AST 33 15 - 41 U/L   ALT 16 0 - 44 U/L   Alkaline Phosphatase 45 38 - 126 U/L   Total Bilirubin 0.8 0.3 - 1.2 mg/dL   GFR, Estimated >04 >54 mL/min    Comment: (NOTE) Calculated using the CKD-EPI Creatinine Equation (2021)    Anion gap 13 5 - 15    Comment: Performed at [facility], [address] [place], [place] [address]  CBC with Differential     Status: None   Collection Time: 07/19/23  7:49 PM  Result Value Ref Range   WBC 5.5 4.0 - 10.5 K/uL   RBC 5.61 4.22 - 5.81 MIL/uL   Hemoglobin 14.6 13.0 - 17.0 g/dL   HCT 91.4 78.2 - 95.6 %   MCV 80.7 80.0 -  100.0 fL   MCH 26.0 26.0 - 34.0 pg   MCHC 32.2 30.0 - 36.0 g/dL   RDW 14.7 82.9 - 56.2 %   Platelets 206 150 - 400 K/uL   nRBC 0.0 0.0 - 0.2 %   Neutrophils Relative % 65 %   Neutro Abs 3.6 1.7 - 7.7 K/uL   Lymphocytes Relative 26 %   Lymphs Abs 1.5 0.7 - 4.0 K/uL   Monocytes Relative 7 %   Monocytes Absolute 0.4 0.1 - 1.0 K/uL   Eosinophils Relative 1 %   Eosinophils Absolute 0.1 0.0 - 0.5 K/uL   Basophils Relative 1 %   Basophils Absolute 0.0 0.0 - 0.1 K/uL   Immature Granulocytes 0 %   Abs Immature  Granulocytes 0.02 0.00 - 0.07 K/uL    Comment: Performed at [facility], [address], [place], [place] [address]  Lipase, blood     Status: None   Collection Time: 07/19/23  7:49 PM  Result Value Ref Range   Lipase 48 11 - 51 U/L    Comment: Performed at [facility], [address], [place], [place] [address]  Ethanol     Status: Abnormal   Collection Time: 07/19/23  8:40 PM  Result Value Ref Range   Alcohol, Ethyl (B) 320 (HH) <10 mg/dL    Comment: CRITICAL RESULT CALLED TO, READ BACK BY AND VERIFIED WITH ORE,H RN @ 2053 07/19/23 BY CHILDRESS,E (NOTE) Lowest detectable limit for serum alcohol is 10 mg/dL.  For medical purposes only. Performed at [facility], [address] [place], [place] [address]     Current Facility-Administered Medications  Medication Dose Route Frequency Provider Last Rate Last Admin   escitalopram (LEXAPRO) tablet 20 mg  20 mg Oral Daily Linwood Dibbles, MD   20 mg at 07/20/23 2841   gabapentin (NEURONTIN) capsule 300 mg  300 mg Oral TID Linwood Dibbles, MD   300 mg at 07/20/23 0827   LORazepam (ATIVAN) tablet 1-4 mg  1-4 mg Oral Q1H PRN Blue, Soijett A, PA-C   1 mg at 07/20/23 1117   ondansetron (ZOFRAN) tablet 4 mg  4 mg Oral Q6H Linwood Dibbles, MD   4 mg at 07/20/23 1117   sodium chloride tablet 1 g  1 g Oral BID WC Linwood Dibbles, MD       traZODone (DESYREL) tablet 50 mg  50 mg Oral QHS PRN Linwood Dibbles, MD       Current Outpatient Medications  Medication Sig Dispense Refill   escitalopram (LEXAPRO) 20 MG tablet Take 1 tablet (20 mg total) by mouth daily. 90 tablet 1   gabapentin (NEURONTIN) 300 MG capsule Take 1 capsule (300 mg total) by mouth 3 (three) times daily. 90 capsule 3   hydrOXYzine (VISTARIL) 25 MG capsule Take 1 capsule (25 mg total) by mouth every 8 (eight) hours as needed. For anxiety 60 capsule 1   ketoconazole (NIZORAL) 200 MG tablet Take 1 tablet (200 mg total) by mouth daily. 14 tablet 0    ondansetron (ZOFRAN) 4 MG tablet Take 1 tablet (4 mg total) by mouth every 6 (six) hours. 12 tablet 0   sodium chloride 1 g tablet Take 1 tablet (1 g total) by mouth 2 (two) times daily with a meal. 30 tablet 0   traZODone (DESYREL) 50 MG tablet Take 1 tablet (50 mg total) by mouth at bedtime as needed for sleep.      Musculoskeletal: Strength & Muscle Tone:  within normal limits Gait & Station: normal Patient leans: N/A   Psychiatric Specialty Exam: Presentation  General Appearance:  Appropriate for Environment  Eye Contact: Good  Speech: Clear and Coherent  Speech Volume: Normal  Handedness: Right   Mood and Affect  Mood: Anxious; Hopeless  Affect: Appropriate; Depressed   Thought Process  Thought Processes: Coherent  Descriptions of Associations:Intact  Orientation:Full (Time, Place and Person)  Thought Content:Logical  History of Schizophrenia/Schizoaffective disorder:No data recorded Duration of Psychotic Symptoms:No data recorded Hallucinations:Hallucinations: None  Ideas of Reference:None  Suicidal Thoughts:Suicidal Thoughts: No  Homicidal Thoughts:Homicidal Thoughts: No   Sensorium  Memory: Immediate Fair; Recent Fair  Judgment: Fair  Insight: Fair   Art therapist  Concentration: Fair  Attention Span: Fair  Recall: [employer] of Knowledge: Fair  Language: Fair   Psychomotor Activity  Psychomotor Activity: Psychomotor Activity: Normal   Assets  Assets: Communication Skills; Desire for Improvement; Housing    Sleep  Sleep: Sleep: Poor   Physical Exam: Physical Exam Vitals and nursing note reviewed. Exam conducted with a chaperone present.  Neurological:     Mental Status: He is alert.  Psychiatric:        Attention and Perception: Attention normal.        Mood and Affect: Mood is anxious. Affect is tearful.        Speech: Speech is slurred.        Behavior: Behavior is cooperative.         Thought Content: Thought content normal.        Cognition and Memory: Memory normal.        Judgment: Judgment is inappropriate.    Review of Systems  Psychiatric/Behavioral:  Positive for substance abuse.    Blood pressure 120/78, pulse 80, temperature 98.6 F (37 C), temperature source Oral, resp. rate 18, SpO2 98%. There is no height or weight on file to calculate BMI.   Medical Decision Making: Recommend FBC.  Patient denies SI, HI, AVH. CIWA and Ativan Detox started. Restarted home medications. Patient last CIWA at 1011 was 1; BP 132/95 HR 93. Delirium and seizure precautions started.      Alona Bene, PMHNP 07/20/2023 12:27 PM

## 2023-07-20 NOTE — ED Notes (Signed)
Pt reported feeling nausea and anxious requested medication. Ciwa 5, 1mg  ativan given

## 2023-07-23 ENCOUNTER — Encounter (HOSPITAL_BASED_OUTPATIENT_CLINIC_OR_DEPARTMENT_OTHER): Payer: Self-pay

## 2023-07-23 ENCOUNTER — Emergency Department (HOSPITAL_BASED_OUTPATIENT_CLINIC_OR_DEPARTMENT_OTHER): Payer: 59

## 2023-07-23 ENCOUNTER — Other Ambulatory Visit (HOSPITAL_BASED_OUTPATIENT_CLINIC_OR_DEPARTMENT_OTHER): Payer: Self-pay

## 2023-07-23 ENCOUNTER — Emergency Department (HOSPITAL_BASED_OUTPATIENT_CLINIC_OR_DEPARTMENT_OTHER)
Admission: EM | Admit: 2023-07-23 | Discharge: 2023-07-23 | Disposition: A | Discharge status: Home / Self Care | Payer: 59 | Attending: Emergency Medicine | Admitting: Emergency Medicine

## 2023-07-23 ENCOUNTER — Other Ambulatory Visit: Payer: Self-pay

## 2023-07-23 DIAGNOSIS — R591 Generalized enlarged lymph nodes: Secondary | ICD-10-CM | POA: Diagnosis not present

## 2023-07-23 DIAGNOSIS — I10 Essential (primary) hypertension: Secondary | ICD-10-CM | POA: Diagnosis not present

## 2023-07-23 DIAGNOSIS — M542 Cervicalgia: Secondary | ICD-10-CM | POA: Insufficient documentation

## 2023-07-23 DIAGNOSIS — E871 Hypo-osmolality and hyponatremia: Secondary | ICD-10-CM | POA: Diagnosis not present

## 2023-07-23 DIAGNOSIS — R42 Dizziness and giddiness: Secondary | ICD-10-CM

## 2023-07-23 DIAGNOSIS — R441 Visual hallucinations: Secondary | ICD-10-CM | POA: Diagnosis not present

## 2023-07-23 DIAGNOSIS — D696 Thrombocytopenia, unspecified: Secondary | ICD-10-CM | POA: Diagnosis not present

## 2023-07-23 DIAGNOSIS — D869 Sarcoidosis, unspecified: Secondary | ICD-10-CM | POA: Insufficient documentation

## 2023-07-23 DIAGNOSIS — I6529 Occlusion and stenosis of unspecified carotid artery: Secondary | ICD-10-CM | POA: Diagnosis not present

## 2023-07-23 DIAGNOSIS — I6782 Cerebral ischemia: Secondary | ICD-10-CM | POA: Diagnosis not present

## 2023-07-23 LAB — CBC WITH DIFFERENTIAL/PLATELET
Abs Immature Granulocytes: 0.02 10*3/uL (ref 0.00–0.07)
Basophils Absolute: 0 10*3/uL (ref 0.0–0.1)
Basophils Relative: 0 %
Eosinophils Absolute: 0.1 10*3/uL (ref 0.0–0.5)
Eosinophils Relative: 1 %
HCT: 40.2 % (ref 39.0–52.0)
Hemoglobin: 13 g/dL (ref 13.0–17.0)
Immature Granulocytes: 0 %
Lymphocytes Relative: 15 %
Lymphs Abs: 0.8 10*3/uL (ref 0.7–4.0)
MCH: 26.6 pg (ref 26.0–34.0)
MCHC: 32.3 g/dL (ref 30.0–36.0)
MCV: 82.4 fL (ref 80.0–100.0)
Monocytes Absolute: 0.6 10*3/uL (ref 0.1–1.0)
Monocytes Relative: 12 %
Neutro Abs: 3.7 10*3/uL (ref 1.7–7.7)
Neutrophils Relative %: 72 %
Platelets: 147 10*3/uL — ABNORMAL LOW (ref 150–400)
RBC: 4.88 MIL/uL (ref 4.22–5.81)
RDW: 14.6 % (ref 11.5–15.5)
WBC: 5.2 10*3/uL (ref 4.0–10.5)
nRBC: 0 % (ref 0.0–0.2)

## 2023-07-23 LAB — COMPREHENSIVE METABOLIC PANEL
ALT: 9 U/L (ref 0–44)
AST: 23 U/L (ref 15–41)
Albumin: 4 g/dL (ref 3.5–5.0)
Alkaline Phosphatase: 35 U/L — ABNORMAL LOW (ref 38–126)
Anion gap: 8 (ref 5–15)
BUN: 9 mg/dL (ref 8–23)
CO2: 28 mmol/L (ref 22–32)
Calcium: 8.7 mg/dL — ABNORMAL LOW (ref 8.9–10.3)
Chloride: 98 mmol/L (ref 98–111)
Creatinine, Ser: 1.01 mg/dL (ref 0.61–1.24)
GFR, Estimated: >60 mL/min (ref >60)
Glucose, Bld: 94 mg/dL (ref 70–99)
Potassium: 4 mmol/L (ref 3.5–5.1)
Sodium: 134 mmol/L — ABNORMAL LOW (ref 135–145)
Total Bilirubin: 0.9 mg/dL (ref 0.3–1.2)
Total Protein: 7.5 g/dL (ref 6.5–8.1)

## 2023-07-23 LAB — RAPID URINE DRUG SCREEN, HOSP PERFORMED
Amphetamines: NOT DETECTED
Barbiturates: NOT DETECTED
Benzodiazepines: NOT DETECTED
Cocaine: NOT DETECTED
Opiates: NOT DETECTED
Tetrahydrocannabinol: NOT DETECTED

## 2023-07-23 MED ORDER — CHLORDIAZEPOXIDE HCL 25 MG PO CAPS
ORAL_CAPSULE | ORAL | 0 refills | Status: AC
  Filled 2023-07-23: qty 10, 3d supply, fill #0

## 2023-07-23 MED ORDER — GABAPENTIN 300 MG PO CAPS
300.0000 mg | ORAL_CAPSULE | Freq: Three times a day (TID) | ORAL | 0 refills | Status: DC
  Filled 2023-07-23: qty 90, 30d supply, fill #0

## 2023-07-23 MED ORDER — KETOROLAC TROMETHAMINE 15 MG/ML IJ SOLN
15.0000 mg | Freq: Once | INTRAMUSCULAR | Status: AC
  Administered 2023-07-23: 15 mg via INTRAVENOUS
  Filled 2023-07-23: qty 1

## 2023-07-23 MED ORDER — IOHEXOL 350 MG/ML SOLN
100.0000 mL | Freq: Once | INTRAVENOUS | Status: AC | PRN
  Administered 2023-07-23: 75 mL via INTRAVENOUS

## 2023-07-23 NOTE — ED Provider Notes (Signed)
[place] EMERGENCY DEPARTMENT AT [facility] Provider Note   CSN: [id number] Arrival date & time: 07/23/23  4098     History {Add pertinent medical, surgical, social history, OB history to HPI:1} No chief complaint on file.   [name] [name] is a 62 y.o. male.  HPI   62 year old male presents emergency department with complaints of neck pain, dizziness, substance ingestion.  Patient states that he is hide symptoms of dizziness/gait instability for the past 3 to 4 months.  Reports worsening after he left the [facility] urgent care this past Friday.  States he was given an unknown substance/medication that worsened his ability to walk before returning back to normal.  States he was also experiencing some visual hallucinations where he is seeing objects on the street and trying to avoid said objects.  States at that time was also experiencing feelings of people following him/trying to harm him.  States he has a history of chronic alcohol abuse with recently drinking 2 beers yesterday with no alcohol consumption reported today.  Denies any other substance use.  Also complaining of some left-sided neck pain that has been present for a little over a month.  States on the left side of his neck does not radiate anywhere and is worsened with movement.  States he has a history of similar kind of neck pain in the past that has gone away on its own.  States that he recently had a fall this past Friday when he felt more unstable and was trying to avoid objects.  States that he landed on his left hip but does not think that he hit his head.  Denies any visual service from baseline, slurred speech, difficulty swallowing, facial droop, weakness and sensory deficits in upper extremities.  Denies any chest pain, shortness of breath, fever, chills, abdominal pain, nausea, vomiting.  States that he wants to be tested for what ever substance was given to him in the behavioral urgent care despite feeling back to  baseline.  Past medical history significant for hyperlipidemia, pancreatitis, CVA, sarcoidosis of lung, alcoholic neuropathy, hypertension, GERD, depression, alcohol abuse, MDD  Home Medications Prior to Admission medications   Medication Sig Start Date End Date Taking? Authorizing Provider  chlordiazePOXIDE (LIBRIUM) 25 MG capsule Take 2 capsules (50 mg total) by mouth 3 (three) times daily for 1 day, THEN 1-2 capsules (25-50 mg total) 2 (two) times daily for 1 day, THEN 1-2 capsules (25-50 mg total) daily for 1 day. 07/20/23 07/25/23  Loetta Rough, MD  escitalopram (LEXAPRO) 20 MG tablet Take 1 tablet (20 mg total) by mouth daily. 02/21/23   Claiborne Rigg, NP  gabapentin (NEURONTIN) 300 MG capsule Take 1 capsule (300 mg total) by mouth 3 (three) times daily. Patient taking differently: Take 300 mg by mouth 4 (four) times daily. 02/21/23   Claiborne Rigg, NP  hydrOXYzine (VISTARIL) 25 MG capsule Take 1 capsule (25 mg total) by mouth every 8 (eight) hours as needed. For anxiety Patient not taking: Reported on 07/20/2023 05/10/23   Anders Simmonds, PA-C  ketoconazole (NIZORAL) 200 MG tablet Take 1 tablet (200 mg total) by mouth daily. Patient not taking: Reported on 07/20/2023 05/10/23   Anders Simmonds, PA-C  ondansetron (ZOFRAN) 4 MG tablet Take 1 tablet (4 mg total) by mouth every 6 (six) hours. Patient not taking: Reported on 07/20/2023 04/22/23   Jeanelle Malling, PA  sodium chloride 1 g tablet Take 1 tablet (1 g total) by mouth 2 (two)  times daily with a meal. Patient not taking: Reported on 07/20/2023 05/10/23   Anders Simmonds, PA-C  traZODone (DESYREL) 50 MG tablet Take 1 tablet (50 mg total) by mouth at bedtime as needed for sleep. 11/13/22   Lamar Sprinkles, MD      Allergies    Oxycodone    Review of Systems   Review of Systems  All other systems reviewed and are negative.   Physical Exam Updated Vital Signs There were no vitals taken for this visit. Physical Exam Vitals and  nursing note reviewed.  Constitutional:      General: He is not in acute distress.    Appearance: He is well-developed.  HENT:     Head: Normocephalic and atraumatic.  Eyes:     Conjunctiva/sclera: Conjunctivae normal.  Cardiovascular:     Rate and Rhythm: Normal rate and regular rhythm.     Heart sounds: No murmur heard. Pulmonary:     Effort: Pulmonary effort is normal. No respiratory distress.     Breath sounds: Normal breath sounds.  Abdominal:     Palpations: Abdomen is soft.     Tenderness: There is no abdominal tenderness.  Musculoskeletal:        General: No swelling.     Cervical back: Neck supple.     Comments: No midline tenderness of cervical spine.  Paraspinal tenderness on the left cervical region with some tenderness left trapezial ridge.  Pain elicited with horizontal rotation of neck.  Kernig and Brudzinski negative.  Skin:    General: Skin is warm and dry.     Capillary Refill: Capillary refill takes less than 2 seconds.  Neurological:     Mental Status: He is alert.     Comments: Alert and oriented to self, place, time and event.   Speech is fluent, clear without dysarthria or dysphasia.   Strength 5/5 in upper/lower extremities   Sensation intact in upper/lower extremities   Normal gait.  Patient unable to ambulate independently.  No obvious gait deviation. Negative Romberg. No pronator drift.  Normal finger-to-nose and feet tapping.  CN I not tested  CN II not tested CN III, IV, VI PERRLA and EOMs intact bilaterally  CN V Intact sensation to sharp and light touch to the face  CN VII facial movements symmetric  CN VIII not tested  CN IX, X no uvula deviation, symmetric rise of soft palate  CN XI 5/5 SCM and trapezius strength bilaterally  CN XII Midline tongue protrusion, symmetric L/R movements     Psychiatric:        Mood and Affect: Mood normal.     ED Results / Procedures / Treatments   Labs (all labs ordered are listed, but only abnormal  results are displayed) Labs Reviewed - No data to display  EKG None  Radiology No results found.  Procedures Procedures  {Document cardiac monitor, telemetry assessment procedure when appropriate:1}  Medications Ordered in ED Medications - No data to display  ED Course/ Medical Decision Making/ A&P   {   Click here for ABCD2, HEART and other calculatorsREFRESH Note before signing :1}                              Medical Decision Making Amount and/or Complexity of Data Reviewed Labs: ordered. Radiology: ordered.  Risk Prescription drug management.   This patient presents to the ED for concern of dizziness, this involves an extensive number of  treatment options, and is a complaint that carries with it a high risk of complications and morbidity.  The differential diagnosis includes CVA, arrhythmia, BPPV, carotid artery/vertebral artery occlusion/dissection, malignancy, ACS,   Co morbidities that complicate the patient evaluation  See HPI   Additional history obtained:  Additional history obtained from EMR External records from outside source obtained and reviewed including hospital records   Lab Tests:  I Ordered, and personally interpreted labs.  The pertinent results include: No leukocytosis.  No evidence of anemia.  Thrombocytopenia of 147 of which is baseline.  Hyponatremia of 134 as well as hypocalcemia of 8.7.  No other electrolyte abnormalities.  No transaminitis.  No renal dysfunction.  UDS negative.   Imaging Studies ordered:  I ordered imaging studies including CTA head and neck, CT C-spine I independently visualized and interpreted imaging which showed no acute intracranial abnormality.  No acute fracture or traumatic listhesis of cervical spine.  No flow-limiting lesion or large vessel occlusion of head or neck.  Biapical bronchiectasis and calcified bilateral mediastinal and hilar lymphadenopathy. I agree with the radiologist interpretation  Cardiac  Monitoring: / EKG:  The patient was maintained on a cardiac monitor.  I personally viewed and interpreted the cardiac monitored which showed an underlying rhythm of: Sinus rhythm with possible LVH.  No obvious acute ischemic change from prior EKG performed.   Consultations Obtained:  I requested consultation with attending physician Dr. Dalene Seltzer who is in agreement treatment plan going forward   Problem List / ED Course / Critical interventions / Medication management  Sarcoidosis, neck pain, dizziness I ordered medication including Toradol  Reevaluation of the patient after these medicines showed that the patient improved I have reviewed the patients home medicines and have made adjustments as needed   Social Determinants of Health:  Chronic alcohol abuse.  Denies substance use otherwise.  Denies any cigarette use.   Test / Admission - Considered:  Sarcoidosis, neck pain, dizziness Vitals signs significant for mild hypertension with blood pressure 141/103. Otherwise within normal range and stable throughout visit. Laboratory/imaging studies significant for: See above *** Worrisome signs and symptoms were discussed with the patient, and the patient acknowledged understanding to return to the ED if noticed. Patient was stable upon discharge.    {Document critical care time when appropriate:1} {Document review of labs and clinical decision tools ie heart score, Chads2Vasc2 etc:1}  {Document your independent review of radiology images, and any outside records:1} {Document your discussion with family members, caretakers, and with consultants:1} {Document social determinants of health affecting pt's care:1} {Document your decision making why or why not admission, treatments were needed:1} Final Clinical Impression(s) / ED Diagnoses Final diagnoses:  None    Rx / DC Orders ED Discharge Orders     None

## 2023-07-23 NOTE — ED Notes (Signed)
Pt discharged home and given discharge paperwork. Opportunities given for questions. Pt verbalizes understanding. PIV removed x1. Stone,Heather R , RN 

## 2023-07-23 NOTE — Discharge Instructions (Addendum)
As discussed, workup today overall reassuring.  Lab test were without any gross abnormality.  CT imaging of your head and neck did not show evidence of stroke, significant vascular occlusion/dissection.  Your urine drug screen was negative.  I suspect that what ever was given to you from a medication perspective is now out of your system given the amount of time since it was given.  Recommend follow-up with primary care as well as pulmonology in the outpatient setting as the CT scan did show signs of progression of your sarcoidosis.  Recommend follow-up with neurology regarding your neuropathy as well as walking.  Recommend follow-up with primary care for further assessment/evaluation..  Please do not hesitate to return to emergency department if there are worrisome signs and symptoms we discussed become apparent.

## 2023-07-23 NOTE — ED Triage Notes (Signed)
Pt arrives ambulatory to ED with request for labs stating 'I want blood work to see if I was given fentanyl' Denies willing using fentanyl. States that he struggles with ETOH and was at [facility] and feels like he was 'given something'. Pt also c/o dizziness and feeling off balance when walking, and feeling forgetful 'for months'. Would also like to be evaluated for pain in left side of neck that has been ongoing for sometime, unsure of any injury. Last ETOH 2 beers yesterday and would like help with ETOH today.

## 2023-07-23 NOTE — ED Notes (Signed)
ED Provider at bedside. 

## 2023-07-24 ENCOUNTER — Ambulatory Visit: Payer: 59 | Admitting: Nurse Practitioner

## 2023-07-24 ENCOUNTER — Other Ambulatory Visit: Payer: Self-pay

## 2023-07-26 ENCOUNTER — Encounter: Payer: Self-pay | Admitting: Internal Medicine

## 2023-07-26 NOTE — Progress Notes (Deleted)
Office Visit Note  Patient: [name] [name]             Date of Birth: 07/25/1961           MRN: [id number]             PCP: Claiborne Rigg, NP Referring: Claiborne Rigg, NP Visit Date: 07/26/2023 Occupation: @GUAROCC @  Subjective:  No chief complaint on file.   History of Present Illness: [name] [name] is a 62 y.o. male here for evaluation of joint pain in multiple areas with positive rheumatoid factor and elevated sedimentation rate. He has a known history of pulmonary sarcoidosis diagnosed about 20 years ago with bronchoscopy biopsy and took steroids for about 6 months. Previously saw Dr. Marchelle Gearing in 2019 with evaluation showing chronic scarring changes and rash possibly lupus pernio.***    Labs reviewed 02/2023 ANA 1:80 Reflex ENA neg RF 15.2 ESR 56 CRP 1   Imaging Reviewed 07/23/23 CT Angio Head/Neck IMPRESSION: 1. No acute intracranial abnormality. 2. No acute fracture or traumatic malalignment of the cervical spine. 3. No flow limiting stenosis or large vessel occlusion in the head or neck. 4. Biapical bronchiectasis and calcified bilateral mediastinal and hilar lymphadenopathy, which can be seen in the setting of sarcoidosis, possibly with early manifestations of fibrosis. Recommend further evaluation with a chest CT.  11/14/22 Xray left knee IMPRESSION: 1. New moderate knee joint effusion.  No acute osseous abnormality. 2. Progressive, severe medial compartment osteoarthrosis.  08/21/21 Xray B/l knees IMPRESSION: Bilateral medial compartment degenerative arthritis, left greater than right, with resultant mild varus angulation of the knees bilaterally. Small left knee effusion. Variant anatomy with multipartite left patella.  11/14/22 MRI Lumbar Spine IMPRESSION: 1. L4-L5 mild spinal canal stenosis and mild right neural foraminal narrowing. Narrowing of the lateral recesses at this level could affect the descending L5 nerve roots. 2. L5-S1 mild right neural  foraminal narrowing. 3. Narrowing of the lateral recesses at L3-L4 could affect the descending L4 nerve roots.  Activities of Daily Living:  Patient reports morning stiffness for *** {minute/hour:19697}.   Patient {ACTIONS;DENIES/REPORTS:21021675::"Denies"} nocturnal pain.  Difficulty dressing/grooming: {ACTIONS;DENIES/REPORTS:21021675::"Denies"} Difficulty climbing stairs: {ACTIONS;DENIES/REPORTS:21021675::"Denies"} Difficulty getting out of chair: {ACTIONS;DENIES/REPORTS:21021675::"Denies"} Difficulty using hands for taps, buttons, cutlery, and/or writing: {ACTIONS;DENIES/REPORTS:21021675::"Denies"}  No Rheumatology ROS completed.   PMFS History:  Patient Active Problem List   Diagnosis Date Noted   Alcohol use disorder, severe, dependence (HCC) 11/11/2022   Alcohol use disorder, severe, in early remission (HCC) 11/10/2022   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   Alcohol abuse with intoxication (HCC) 03/27/2022   Neuropathy 12/30/2021   Alcohol dependence with alcohol-induced mood disorder (HCC) 12/27/2021   Uncomplicated alcohol dependence (HCC) 06/16/2021   Essential hypertension 04/07/2021   Prediabetes 04/07/2021   Other hyperlipidemia 04/07/2021   Major depressive disorder, severe (HCC) 03/16/2021   History of hyperlipidemia 12/21/2020   Seborrheic dermatitis of scalp 12/21/2020   Substance abuse (HCC) 12/21/2020   Hyperopia of both eyes with astigmatism and presbyopia 12/20/2020   Posterior vitreous detachment of right eye 12/20/2020   MDD (major depressive disorder), recurrent episode, severe (HCC) 06/30/2017   Lateral epicondylitis of right elbow 01/05/2017   Strain, MCP, hand, right, initial encounter 01/05/2017   Vasomotor rhinitis 02/13/2016   Bilateral chronic knee pain 01/11/2016   Chronic pain of right wrist 07/07/2015   Carpal tunnel syndrome of right wrist 05/20/2015   Dislocation of right shoulder joint 05/20/2015   Tear of medial meniscus of  right knee 03/24/2015   Pulmonary  sarcoidosis (HCC) 01/27/2015   Chronic cough 01/15/2015   Onychomycosis of toenail 12/15/2014   Dermatitis of face 11/11/2014   GERD (gastroesophageal reflux disease)    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HCC) 01/23/2014   Alcohol abuse 01/19/2014   Alcohol dependence (HCC) 09/02/2013   Generalized anxiety disorder 09/02/2013   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    Past Medical History:  Diagnosis Date   Alcohol abuse    Anxiety and depression    Bilateral primary osteoarthritis of knee    Carpal tunnel syndrome of right wrist Dx 2015   Cervical stenosis of spine    MRI 10/2021   Essential hypertension    Gallbladder sludge    GERD (gastroesophageal reflux disease) Dx 2007   Hepatic steatosis    History of alcoholic hepatitis    History of CVA (cerebrovascular accident)    05/2021 MRI brain: Mild chronic microvascular ischemic change in the white matter. Small chronic infarct left superior cerebellum.  Also, chronic microhemorrhage, ? 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   Sarcoidosis of lung (HCC) Dx 1989   Seborrheic dermatitis     Family History  Problem Relation Age of Onset   Diabetes Father    Hypertension Father    Hypertension Paternal Grandfather    Alcohol abuse Paternal Grandfather    Alcohol abuse Maternal Grandfather    Alcohol abuse Maternal Grandmother    Alcohol abuse Paternal Grandmother    Colon cancer Neg Hx    Rectal cancer Neg Hx    Stomach cancer Neg Hx    Bipolar disorder Neg Hx    Depression Neg Hx    Past Surgical History:  Procedure Laterality Date   carpel tunnel release Right 07/28/2014    done in [place]    ELBOW SURGERY     KNEE ARTHROSCOPY     KNEE SURGERY  Left    15 years ago   LIPOMA EXCISION N/A 05/10/2016   Procedure: EXCISION OF SCALP LIPOMA;  Surgeon: [name], MD;  Location: [facility];  Service: General;  Laterality: N/A;   Social History   Social History Narrative   Born and raised in [place], [place] by both parents. He has 2 sisters and is the middle child. He remains close with family. He was married for 16 yrs and seperated in 2005. He has 2 boys who are in their 20's now. He lives alone in [place]. He is working as a Clinical cytogeneticist handed    Lives in a one story home    Immunization History  Administered Date(s) Administered   Influenza,inj,Quad PF,6+ Mos 11/10/2014, 09/27/2015, 07/28/2016, 11/12/2017, 09/24/2018, 09/08/2020   Influenza-Unspecified 09/26/2021   PFIZER(Purple Top)SARS-COV-2 Vaccination 05/28/2020, 06/23/2020, 11/30/2020   Pneumococcal Polysaccharide-23 11/10/2014, 07/01/2017   Tdap 07/07/2015     Objective: Vital Signs: There were no vitals taken for this visit.   Physical Exam   Musculoskeletal Exam: ***  CDAI Exam: CDAI Score: -- Patient Global: --; Provider Global: -- Swollen: --; Tender: -- Joint Exam 07/26/2023   No joint exam has been documented for this visit   There is currently no information documented on the homunculus. Go to the Rheumatology  activity and complete the homunculus joint exam.  Investigation: No additional findings.  Imaging: CT C-SPINE NO CHARGE  Result Date: 07/23/2023 CLINICAL DATA:  Carotid artery stenosis EXAM: CT CERVICAL SPINE WITHOUT CONTRAST CT ANGIOGRAPHY HEAD AND NECK WITH AND WITHOUT CONTRAST TECHNIQUE: Multidetector CT imaging of the cervical spine was performed without intravenous contrast. Multiplanar CT image reconstructions were also generated. Multidetector CT imaging of the head and neck was performed using the standard protocol during bolus administration of intravenous contrast. Multiplanar CT image reconstructions and MIPs  were obtained to evaluate the vascular anatomy. Carotid stenosis measurements (when applicable) are obtained utilizing NASCET criteria, using the distal internal carotid diameter as the denominator. RADIATION DOSE REDUCTION: This exam was performed according to the departmental dose-optimization program which includes automated exposure control, adjustment of the mA and/or kV according to patient size and/or use of iterative reconstruction technique. CONTRAST:  75mL OMNIPAQUE IOHEXOL 350 MG/ML SOLN COMPARISON:  MR C Spine 06/11/21 FINDINGS: CT HEAD FINDINGS: Brain: No evidence of acute infarction, hemorrhage, hydrocephalus, extra-axial collection or mass lesion/mass effect. Sequela of moderate chronic microvascular ischemic change. Vascular: No hyperdense vessel or unexpected calcification. Skull: Normal. Negative for fracture or focal lesion. Sinuses/Orbits: No middle ear or mastoid effusion. Paranasal sinuses are clear. Orbits are unremarkable. Other: None. Review of the MIP images confirms the above findings CT CERVICAL SPINE FINDINGS: Alignment: Trace anterolisthesis of C4 on C5, C5 on C6, and C6 on C7. Skull base and vertebrae: No acute fracture. No primary bone lesion or focal pathologic process. Soft tissues and spinal canal: No prevertebral fluid or swelling. No visible canal hematoma. Disc levels:  No evidence of high-grade spinal canal stenosis. Upper chest: Biapical bronchiectasis and calcified bilateral mediastinal and hilar lymphadenopathy. Other: None. CTA NECK FINDINGS: Aortic arch: Standard branching. Imaged portion shows no evidence of aneurysm or dissection. No significant stenosis of the major arch vessel origins. Right carotid system: No evidence of dissection, stenosis (50% or greater), or occlusion. Left carotid system: No evidence of dissection, stenosis (50% or greater), or occlusion. Vertebral arteries: Codominant. No evidence of dissection, stenosis (50% or greater), or occlusion. Skeleton:  Negative. Other neck: Negative. Upper chest: Biapical bronchiectasis and calcified bilateral mediastinal and hilar lymphadenopathy. Review of the MIP images confirms the above findings CTA HEAD FINDINGS: Anterior circulation: No significant stenosis, proximal occlusion, aneurysm, or vascular malformation. Posterior circulation: No significant stenosis, proximal occlusion, aneurysm, or vascular malformation. Venous sinuses: As permitted by contrast timing, patent. Anatomic variants: None Review of the MIP images confirms the above findings IMPRESSION: 1. No acute intracranial abnormality. 2. No acute fracture or traumatic malalignment of the cervical spine. 3. No flow limiting stenosis or large vessel occlusion in the head or neck. 4. Biapical bronchiectasis and calcified bilateral mediastinal and hilar lymphadenopathy, which can be seen in the setting of sarcoidosis, possibly with early manifestations of fibrosis. Recommend further evaluation with a chest CT. Electronically Signed   By: Lorenza Cambridge M.D.   On: 07/23/2023 12:36   CT ANGIO HEAD NECK W WO CM  Result Date: 07/23/2023 CLINICAL DATA:  Carotid artery stenosis EXAM: CT CERVICAL SPINE WITHOUT CONTRAST CT ANGIOGRAPHY HEAD AND NECK WITH AND WITHOUT CONTRAST TECHNIQUE: Multidetector CT imaging of the cervical spine was performed without intravenous contrast. Multiplanar CT image reconstructions were also generated. Multidetector CT imaging of the head and neck was performed using the standard protocol during bolus administration of intravenous contrast. Multiplanar CT image reconstructions and MIPs were obtained to evaluate the vascular anatomy. Carotid stenosis  measurements (when applicable) are obtained utilizing NASCET criteria, using the distal internal carotid diameter as the denominator. RADIATION DOSE REDUCTION: This exam was performed according to the departmental dose-optimization program which includes automated exposure control, adjustment of  the mA and/or kV according to patient size and/or use of iterative reconstruction technique. CONTRAST:  75mL OMNIPAQUE IOHEXOL 350 MG/ML SOLN COMPARISON:  MR C Spine 06/11/21 FINDINGS: CT HEAD FINDINGS: Brain: No evidence of acute infarction, hemorrhage, hydrocephalus, extra-axial collection or mass lesion/mass effect. Sequela of moderate chronic microvascular ischemic change. Vascular: No hyperdense vessel or unexpected calcification. Skull: Normal. Negative for fracture or focal lesion. Sinuses/Orbits: No middle ear or mastoid effusion. Paranasal sinuses are clear. Orbits are unremarkable. Other: None. Review of the MIP images confirms the above findings CT CERVICAL SPINE FINDINGS: Alignment: Trace anterolisthesis of C4 on C5, C5 on C6, and C6 on C7. Skull base and vertebrae: No acute fracture. No primary bone lesion or focal pathologic process. Soft tissues and spinal canal: No prevertebral fluid or swelling. No visible canal hematoma. Disc levels:  No evidence of high-grade spinal canal stenosis. Upper chest: Biapical bronchiectasis and calcified bilateral mediastinal and hilar lymphadenopathy. Other: None. CTA NECK FINDINGS: Aortic arch: Standard branching. Imaged portion shows no evidence of aneurysm or dissection. No significant stenosis of the major arch vessel origins. Right carotid system: No evidence of dissection, stenosis (50% or greater), or occlusion. Left carotid system: No evidence of dissection, stenosis (50% or greater), or occlusion. Vertebral arteries: Codominant. No evidence of dissection, stenosis (50% or greater), or occlusion. Skeleton: Negative. Other neck: Negative. Upper chest: Biapical bronchiectasis and calcified bilateral mediastinal and hilar lymphadenopathy. Review of the MIP images confirms the above findings CTA HEAD FINDINGS: Anterior circulation: No significant stenosis, proximal occlusion, aneurysm, or vascular malformation. Posterior circulation: No significant stenosis,  proximal occlusion, aneurysm, or vascular malformation. Venous sinuses: As permitted by contrast timing, patent. Anatomic variants: None Review of the MIP images confirms the above findings IMPRESSION: 1. No acute intracranial abnormality. 2. No acute fracture or traumatic malalignment of the cervical spine. 3. No flow limiting stenosis or large vessel occlusion in the head or neck. 4. Biapical bronchiectasis and calcified bilateral mediastinal and hilar lymphadenopathy, which can be seen in the setting of sarcoidosis, possibly with early manifestations of fibrosis. Recommend further evaluation with a chest CT. Electronically Signed   By: Lorenza Cambridge M.D.   On: 07/23/2023 12:36    Recent Labs: Lab Results  Component Value Date   WBC 5.2 07/23/2023   HGB 13.0 07/23/2023   PLT 147 (L) 07/23/2023   NA 134 (L) 07/23/2023   K 4.0 07/23/2023   CL 98 07/23/2023   CO2 28 07/23/2023   GLUCOSE 94 07/23/2023   BUN 9 07/23/2023   CREATININE 1.01 07/23/2023   BILITOT 0.9 07/23/2023   ALKPHOS 35 (L) 07/23/2023   AST 23 07/23/2023   ALT 9 07/23/2023   PROT 7.5 07/23/2023   ALBUMIN 4.0 07/23/2023   CALCIUM 8.7 (L) 07/23/2023   GFRAA >60 05/03/2018    Speciality Comments: No specialty comments available.  Procedures:  No procedures performed Allergies: Oxycodone   Assessment / Plan:     Visit Diagnoses: No diagnosis found.  Orders: No orders of the defined types were placed in this encounter.  No orders of the defined types were placed in this encounter.   Face-to-face time spent with patient was *** minutes. Greater than 50% of time was spent in counseling and coordination of care.  Follow-Up Instructions:  No follow-ups on file.   Fuller Plan, MD  Note - This record has been created using [employer].  Chart creation errors have been sought, but may not always  have been located. Such creation errors do not reflect on  the standard of medical care.

## 2023-08-09 ENCOUNTER — Other Ambulatory Visit (HOSPITAL_BASED_OUTPATIENT_CLINIC_OR_DEPARTMENT_OTHER): Payer: Self-pay

## 2023-08-09 ENCOUNTER — Other Ambulatory Visit: Payer: Self-pay

## 2023-08-09 DIAGNOSIS — F411 Generalized anxiety disorder: Secondary | ICD-10-CM | POA: Diagnosis not present

## 2023-08-09 MED ORDER — HYDROXYZINE HCL 25 MG PO TABS
25.0000 mg | ORAL_TABLET | Freq: Two times a day (BID) | ORAL | 0 refills | Status: DC | PRN
  Filled 2023-08-09 (×2): qty 60, 30d supply, fill #0

## 2023-08-17 ENCOUNTER — Other Ambulatory Visit: Payer: Self-pay

## 2023-08-29 DIAGNOSIS — R1084 Generalized abdominal pain: Secondary | ICD-10-CM | POA: Diagnosis not present

## 2023-08-29 DIAGNOSIS — F19939 Other psychoactive substance use, unspecified with withdrawal, unspecified: Secondary | ICD-10-CM | POA: Diagnosis not present

## 2023-08-29 DIAGNOSIS — E871 Hypo-osmolality and hyponatremia: Secondary | ICD-10-CM | POA: Diagnosis not present

## 2023-08-29 DIAGNOSIS — R1013 Epigastric pain: Secondary | ICD-10-CM | POA: Diagnosis not present

## 2023-08-29 DIAGNOSIS — Z87891 Personal history of nicotine dependence: Secondary | ICD-10-CM | POA: Diagnosis not present

## 2023-08-29 DIAGNOSIS — F10120 Alcohol abuse with intoxication, uncomplicated: Secondary | ICD-10-CM | POA: Diagnosis not present

## 2023-08-29 DIAGNOSIS — F10129 Alcohol abuse with intoxication, unspecified: Secondary | ICD-10-CM | POA: Diagnosis not present

## 2023-08-29 DIAGNOSIS — R748 Abnormal levels of other serum enzymes: Secondary | ICD-10-CM | POA: Diagnosis not present

## 2023-08-29 DIAGNOSIS — Y907 Blood alcohol level of 200-239 mg/100 ml: Secondary | ICD-10-CM | POA: Diagnosis not present

## 2023-08-29 DIAGNOSIS — F10239 Alcohol dependence with withdrawal, unspecified: Secondary | ICD-10-CM | POA: Diagnosis not present

## 2023-08-29 DIAGNOSIS — R7989 Other specified abnormal findings of blood chemistry: Secondary | ICD-10-CM | POA: Diagnosis not present

## 2023-09-03 ENCOUNTER — Ambulatory Visit: Payer: 59 | Admitting: Nurse Practitioner

## 2023-09-04 ENCOUNTER — Other Ambulatory Visit (HOSPITAL_BASED_OUTPATIENT_CLINIC_OR_DEPARTMENT_OTHER): Payer: Self-pay

## 2023-09-04 ENCOUNTER — Emergency Department (HOSPITAL_BASED_OUTPATIENT_CLINIC_OR_DEPARTMENT_OTHER): Payer: 59

## 2023-09-04 ENCOUNTER — Emergency Department (HOSPITAL_BASED_OUTPATIENT_CLINIC_OR_DEPARTMENT_OTHER)
Admission: EM | Admit: 2023-09-04 | Discharge: 2023-09-04 | Disposition: A | Discharge status: Home / Self Care | Payer: 59 | Attending: Emergency Medicine | Admitting: Emergency Medicine

## 2023-09-04 ENCOUNTER — Emergency Department (HOSPITAL_BASED_OUTPATIENT_CLINIC_OR_DEPARTMENT_OTHER): Payer: 59 | Admitting: Radiology

## 2023-09-04 ENCOUNTER — Other Ambulatory Visit: Payer: Self-pay

## 2023-09-04 DIAGNOSIS — I10 Essential (primary) hypertension: Secondary | ICD-10-CM | POA: Insufficient documentation

## 2023-09-04 DIAGNOSIS — M25519 Pain in unspecified shoulder: Secondary | ICD-10-CM | POA: Diagnosis not present

## 2023-09-04 DIAGNOSIS — R079 Chest pain, unspecified: Secondary | ICD-10-CM | POA: Diagnosis not present

## 2023-09-04 DIAGNOSIS — W1839XA Other fall on same level, initial encounter: Secondary | ICD-10-CM | POA: Insufficient documentation

## 2023-09-04 DIAGNOSIS — R220 Localized swelling, mass and lump, head: Secondary | ICD-10-CM | POA: Diagnosis not present

## 2023-09-04 DIAGNOSIS — J984 Other disorders of lung: Secondary | ICD-10-CM | POA: Diagnosis not present

## 2023-09-04 DIAGNOSIS — S01111A Laceration without foreign body of right eyelid and periocular area, initial encounter: Secondary | ICD-10-CM | POA: Diagnosis not present

## 2023-09-04 DIAGNOSIS — S0591XA Unspecified injury of right eye and orbit, initial encounter: Secondary | ICD-10-CM | POA: Diagnosis not present

## 2023-09-04 DIAGNOSIS — R221 Localized swelling, mass and lump, neck: Secondary | ICD-10-CM | POA: Diagnosis not present

## 2023-09-04 DIAGNOSIS — R609 Edema, unspecified: Secondary | ICD-10-CM | POA: Diagnosis not present

## 2023-09-04 DIAGNOSIS — F1010 Alcohol abuse, uncomplicated: Secondary | ICD-10-CM | POA: Diagnosis not present

## 2023-09-04 DIAGNOSIS — W19XXXA Unspecified fall, initial encounter: Secondary | ICD-10-CM | POA: Diagnosis not present

## 2023-09-04 DIAGNOSIS — M19011 Primary osteoarthritis, right shoulder: Secondary | ICD-10-CM | POA: Diagnosis not present

## 2023-09-04 DIAGNOSIS — M25511 Pain in right shoulder: Secondary | ICD-10-CM | POA: Diagnosis not present

## 2023-09-04 MED ORDER — CHLORDIAZEPOXIDE HCL 25 MG PO CAPS
50.0000 mg | ORAL_CAPSULE | Freq: Once | ORAL | Status: AC
  Administered 2023-09-04: 50 mg via ORAL
  Filled 2023-09-04: qty 2

## 2023-09-04 MED ORDER — CHLORDIAZEPOXIDE HCL 25 MG PO CAPS
ORAL_CAPSULE | ORAL | 0 refills | Status: AC
  Filled 2023-09-04: qty 12, 3d supply, fill #0

## 2023-09-04 MED ORDER — LIDOCAINE-EPINEPHRINE (PF) 2 %-1:200000 IJ SOLN
10.0000 mL | Freq: Once | INTRAMUSCULAR | Status: DC
  Filled 2023-09-04: qty 20

## 2023-09-04 MED ORDER — ONDANSETRON 4 MG PO TBDP
8.0000 mg | ORAL_TABLET | Freq: Once | ORAL | Status: AC
  Administered 2023-09-04: 8 mg via ORAL
  Filled 2023-09-04: qty 2

## 2023-09-04 MED ORDER — LORAZEPAM 1 MG PO TABS
1.0000 mg | ORAL_TABLET | Freq: Once | ORAL | Status: AC
  Administered 2023-09-04: 1 mg via ORAL
  Filled 2023-09-04: qty 1

## 2023-09-04 NOTE — ED Notes (Signed)
RN reviewed discharge instructions with pt and family. Pt and family verbalized understanding and had no further questions. VSS upon discharge.  ?

## 2023-09-04 NOTE — ED Provider Notes (Signed)
Care of patient received from prior provider at 3:32 PM, please see their note for complete H/P and care plan.  Received handoff per ED course.  Clinical Course as of 09/04/23 1532  Tue Sep 04, 2023  1305 Stable  12PPD ETOH. Fell yesterday. Hit face/right shoulder. Follow up XR and OKTG if negative.  [CC]    Clinical Course User Index [CC] Glyn Ade, MD    Reassessment: Patient's history of present illness physical exam findings do not reveal any acute pathology.  PA evaluated at bedside and wanted to treat for alcohol withdrawal so patient is now starting a Librium taper.  Medication sent to his pharmacy.  No new pathology on studies which were pending at time of handoff from prior provider.  Given well appearance patient on my serial exam he is stable for outpatient care and management.  Seems to be responding well to Librium.  Strict return precautions, risk of death if he continues drinking reinforced to the patient. Disposition:  I have considered need for hospitalization, however, considering all of the above, I believe this patient is stable for discharge at this time.  Patient/family educated about specific return precautions for given chief complaint and symptoms.  Patient/family educated about follow-up with PCP.     Patient/family expressed understanding of return precautions and need for follow-up. Patient spoken to regarding all imaging and laboratory results and appropriate follow up for these results. All education provided in verbal form with additional information in written form. Time was allowed for answering of patient questions. Patient discharged.    Emergency Department Medication Summary:   Medications  LORazepam (ATIVAN) tablet 1 mg (1 mg Oral Given 09/04/23 1101)  ondansetron (ZOFRAN-ODT) disintegrating tablet 8 mg (8 mg Oral Given 09/04/23 1101)  lidocaine-EPINEPHrine (XYLOCAINE W/EPI) 2 %-1:200000 (PF) injection 10 mL (10 mLs Infiltration Given 09/04/23  1101)  chlordiazePOXIDE (LIBRIUM) capsule 50 mg (50 mg Oral Given 09/04/23 1509)            Glyn Ade, MD 09/04/23 [phone]

## 2023-09-04 NOTE — ED Provider Notes (Signed)
EMERGENCY DEPARTMENT AT [facility] Provider Note   CSN: [id number] Arrival date & time: 09/04/23  1004     History  Chief Complaint  Patient presents with   Marletta Lor    [name] [name] is a 62 y.o. male.   Fall   62 year old male presents emergency department with complaints of fall.  Patient states that he was actually intoxicated last night when he lost his footing while walking on gravel.  States that incident occurred yesterday evening.  Reports trauma to his face denies any loss of consciousness or blood thinner use.  Denies any chest pain, shortness of breath, abdominal pain, nausea, vomiting.  Denies any visual disturbance from baseline, gait abnormality from baseline, weakness/sensory deficits in upper or lower extremities from baseline, slurred speech, facial droop.  Also complaining of some right shoulder pain.  Patient requesting medication for withdrawal symptoms.  Patient states that he usually drinks about a 12 pack of beer per day with last drink last night.  Denies any other substance use.  Past medical history significant for alcohol dependence, alcoholic peripheral neuropathy, major depressive disorder, hypertension, hyperlipidemia, MDD, pulmonary sarcoidosis, carpal tunnel syndrome, GAD, homelessness, pancreatitis  Home Medications Prior to Admission medications   Medication Sig Start Date End Date Taking? Authorizing Provider  chlordiazePOXIDE (LIBRIUM) 25 MG capsule Take 2 capsules (50 mg total) by mouth 3 (three) times daily for 1 day, THEN 1-2 capsules (25-50 mg total) 2 (two) times daily for 1 day, THEN 1-2 capsules (25-50 mg total) daily for 1 day. 09/04/23 09/07/23 Yes Sherian Maroon A, PA  escitalopram (LEXAPRO) 20 MG tablet Take 1 tablet (20 mg total) by mouth daily. 02/21/23   Claiborne Rigg, NP  gabapentin (NEURONTIN) 300 MG capsule Take 1 capsule (300 mg total) by mouth 3 (three) times daily. 07/23/23   Peter Garter, PA  hydrOXYzine  (ATARAX) 25 MG tablet Take 1 tablet (25 mg total) by mouth 2 (two) times daily as needed. 08/09/23     hydrOXYzine (VISTARIL) 25 MG capsule Take 1 capsule (25 mg total) by mouth every 8 (eight) hours as needed. For anxiety Patient not taking: Reported on 07/20/2023 05/10/23   Anders Simmonds, PA-C  ondansetron (ZOFRAN) 4 MG tablet Take 1 tablet (4 mg total) by mouth every 6 (six) hours. Patient not taking: Reported on 07/20/2023 04/22/23   Jeanelle Malling, PA  sodium chloride 1 g tablet Take 1 tablet (1 g total) by mouth 2 (two) times daily with a meal. Patient not taking: Reported on 07/20/2023 05/10/23   Anders Simmonds, PA-C  traZODone (DESYREL) 50 MG tablet Take 1 tablet (50 mg total) by mouth at bedtime as needed for sleep. 11/13/22   Lamar Sprinkles, MD      Allergies    Oxycodone    Review of Systems   Review of Systems  All other systems reviewed and are negative.   Physical Exam Updated Vital Signs BP (!) 140/92   Pulse 99   Temp 98.8 F (37.1 C) (Oral)   Resp 17   SpO2 94%  Physical Exam Vitals and nursing note reviewed.  Constitutional:      General: He is not in acute distress.    Appearance: He is well-developed.  HENT:     Head: Normocephalic.     Comments: 1 to 1.5 cm Z shaped laceration appreciated above right eyebrow.  Area superficial.  No obvious foreign body.  Additional abrasion noted on lower lip. Eyes:  Conjunctiva/sclera: Conjunctivae normal.  Cardiovascular:     Rate and Rhythm: Normal rate and regular rhythm.     Heart sounds: No murmur heard. Pulmonary:     Effort: Pulmonary effort is normal. No respiratory distress.     Breath sounds: Normal breath sounds. No wheezing, rhonchi or rales.  Abdominal:     Palpations: Abdomen is soft.     Tenderness: There is no abdominal tenderness.  Musculoskeletal:        General: No swelling.     Cervical back: Neck supple.     Comments: Midline tenderness of cervical spine without step-off or deformity.  Paraspinal  tenderness noted bilaterally.  No midline tenderness of the thoracic or lumbar spine bilaterally.  Some ecchymosis appreciated on the superior aspect of right shoulder.  Tenderness of right proximal humerus as well as distal right clavicle.  Full range of motion of bilateral shoulders, elbows, wrist, digits.  Otherwise, no tenderness of upper or lower extremities.  No chest wall tenderness to palpation.  Skin:    General: Skin is warm and dry.     Capillary Refill: Capillary refill takes less than 2 seconds.  Neurological:     Mental Status: He is alert.  Psychiatric:        Mood and Affect: Mood normal.     ED Results / Procedures / Treatments   Labs (all labs ordered are listed, but only abnormal results are displayed) Labs Reviewed - No data to display  EKG EKG Interpretation Date/Time:  Tuesday September 04 2023 10:45:16 EDT Ventricular Rate:  88 PR Interval:  146 QRS Duration:  78 QT Interval:  344 QTC Calculation: 417 R Axis:   55  Text Interpretation: Sinus rhythm Consider left ventricular hypertrophy Anterior Q waves, possibly due to LVH Minimal ST elevation, inferior leads Confirmed by Glyn Ade ([id number]) on 09/04/2023 1:05:37 PM  Radiology DG Chest Port 1 View  Result Date: 09/04/2023 CLINICAL DATA:  pain EXAM: PORTABLE CHEST 1 VIEW COMPARISON:  11/10/2022. FINDINGS: Redemonstration of heterogeneous linear areas of fibrosis/scarring involving the bilateral upper lung zones with associated upward pulling of bilateral hila, essentially similar to the prior study. There is right apical pleural cap. No acute consolidation or major lung collapse. Bilateral costophrenic angles are clear. Normal cardio-mediastinal silhouette. No acute osseous abnormalities. The soft tissues are within normal limits. IMPRESSION: 1. No active disease. 2. 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 Electronically Signed   By: Jules Schick M.D.   On: 09/04/2023 13:39   DG  Shoulder Right  Result Date: 09/04/2023 CLINICAL DATA:  Right shoulder pain status post fall. EXAM: RIGHT SHOULDER - 2+ VIEW COMPARISON:  05/15/2015. FINDINGS: No acute fracture or dislocation. No aggressive osseous lesion. Glenohumeral and acromioclavicular joints are normal in alignment and exhibit mild degenerative changes. No soft tissue swelling. No radiopaque foreign bodies. IMPRESSION: 1. Mild degenerative change. No acute fracture or dislocation. Electronically Signed   By: Jules Schick M.D.   On: 09/04/2023 13:28   CT Head Wo Contrast  Result Date: 09/04/2023 CLINICAL DATA:  Fall last night, laceration and swelling to right eye and lip EXAM: CT HEAD WITHOUT CONTRAST CT MAXILLOFACIAL WITHOUT CONTRAST CT CERVICAL SPINE WITHOUT CONTRAST TECHNIQUE: Multidetector CT imaging of the head, cervical spine, and maxillofacial structures were performed using the standard protocol without intravenous contrast. Multiplanar CT image reconstructions of the cervical spine and maxillofacial structures were also generated. RADIATION DOSE REDUCTION: This exam was performed according to the departmental dose-optimization program which  includes automated exposure control, adjustment of the mA and/or kV according to patient size and/or use of iterative reconstruction technique. COMPARISON:  CT head and neck angiogram, 07/23/2023 FINDINGS: CT HEAD FINDINGS Brain: No evidence of acute infarction, hemorrhage, hydrocephalus, extra-axial collection or mass lesion/mass effect. Periventricular white matter hypodensity. Vascular: No hyperdense vessel or unexpected calcification. CT FACIAL BONES FINDINGS Skull: Normal. Negative for fracture or focal lesion. Facial bones: No displaced fractures or dislocations. Sinuses/Orbits: No acute finding. Other: Soft tissue contusion of the right brow and forehead (series 2, image 6) as well as the nose and lips (series 503, image 17). CT CERVICAL SPINE FINDINGS Alignment: Degenerative  straightening of the normal cervical lordosis. Skull base and vertebrae: No acute fracture. No primary bone lesion or focal pathologic process. Soft tissues and spinal canal: No prevertebral fluid or swelling. No visible canal hematoma. Disc levels: Moderate disc space height loss and osteophytosis throughout the cervical spine, worst at C6-C7. Upper chest: Fibrotic scarring and bronchiectasis of the right pulmonary apex (series 7, image 85). Other: None. IMPRESSION: 1. No acute intracranial pathology. 2. No displaced fractures or dislocations of the facial bones. 3. Soft tissue contusion of the right brow and forehead as well as the nose and lips. 4. No fracture or static subluxation of the cervical spine. 5. Moderate disc space height loss and osteophytosis throughout the cervical spine, worst at C6-C7. 6. Fibrotic scarring and bronchiectasis of the right pulmonary apex. Electronically Signed   By: Jearld Lesch M.D.   On: 09/04/2023 12:56   CT Cervical Spine Wo Contrast  Result Date: 09/04/2023 CLINICAL DATA:  Fall last night, laceration and swelling to right eye and lip EXAM: CT HEAD WITHOUT CONTRAST CT MAXILLOFACIAL WITHOUT CONTRAST CT CERVICAL SPINE WITHOUT CONTRAST TECHNIQUE: Multidetector CT imaging of the head, cervical spine, and maxillofacial structures were performed using the standard protocol without intravenous contrast. Multiplanar CT image reconstructions of the cervical spine and maxillofacial structures were also generated. RADIATION DOSE REDUCTION: This exam was performed according to the departmental dose-optimization program which includes automated exposure control, adjustment of the mA and/or kV according to patient size and/or use of iterative reconstruction technique. COMPARISON:  CT head and neck angiogram, 07/23/2023 FINDINGS: CT HEAD FINDINGS Brain: No evidence of acute infarction, hemorrhage, hydrocephalus, extra-axial collection or mass lesion/mass effect. Periventricular white  matter hypodensity. Vascular: No hyperdense vessel or unexpected calcification. CT FACIAL BONES FINDINGS Skull: Normal. Negative for fracture or focal lesion. Facial bones: No displaced fractures or dislocations. Sinuses/Orbits: No acute finding. Other: Soft tissue contusion of the right brow and forehead (series 2, image 6) as well as the nose and lips (series 503, image 17). CT CERVICAL SPINE FINDINGS Alignment: Degenerative straightening of the normal cervical lordosis. Skull base and vertebrae: No acute fracture. No primary bone lesion or focal pathologic process. Soft tissues and spinal canal: No prevertebral fluid or swelling. No visible canal hematoma. Disc levels: Moderate disc space height loss and osteophytosis throughout the cervical spine, worst at C6-C7. Upper chest: Fibrotic scarring and bronchiectasis of the right pulmonary apex (series 7, image 85). Other: None. IMPRESSION: 1. No acute intracranial pathology. 2. No displaced fractures or dislocations of the facial bones. 3. Soft tissue contusion of the right brow and forehead as well as the nose and lips. 4. No fracture or static subluxation of the cervical spine. 5. Moderate disc space height loss and osteophytosis throughout the cervical spine, worst at C6-C7. 6. Fibrotic scarring and bronchiectasis of the right pulmonary apex. Electronically Signed  By: Jearld Lesch M.D.   On: 09/04/2023 12:56   CT Maxillofacial Wo Contrast  Result Date: 09/04/2023 CLINICAL DATA:  Fall last night, laceration and swelling to right eye and lip EXAM: CT HEAD WITHOUT CONTRAST CT MAXILLOFACIAL WITHOUT CONTRAST CT CERVICAL SPINE WITHOUT CONTRAST TECHNIQUE: Multidetector CT imaging of the head, cervical spine, and maxillofacial structures were performed using the standard protocol without intravenous contrast. Multiplanar CT image reconstructions of the cervical spine and maxillofacial structures were also generated. RADIATION DOSE REDUCTION: This exam was  performed according to the departmental dose-optimization program which includes automated exposure control, adjustment of the mA and/or kV according to patient size and/or use of iterative reconstruction technique. COMPARISON:  CT head and neck angiogram, 07/23/2023 FINDINGS: CT HEAD FINDINGS Brain: No evidence of acute infarction, hemorrhage, hydrocephalus, extra-axial collection or mass lesion/mass effect. Periventricular white matter hypodensity. Vascular: No hyperdense vessel or unexpected calcification. CT FACIAL BONES FINDINGS Skull: Normal. Negative for fracture or focal lesion. Facial bones: No displaced fractures or dislocations. Sinuses/Orbits: No acute finding. Other: Soft tissue contusion of the right brow and forehead (series 2, image 6) as well as the nose and lips (series 503, image 17). CT CERVICAL SPINE FINDINGS Alignment: Degenerative straightening of the normal cervical lordosis. Skull base and vertebrae: No acute fracture. No primary bone lesion or focal pathologic process. Soft tissues and spinal canal: No prevertebral fluid or swelling. No visible canal hematoma. Disc levels: Moderate disc space height loss and osteophytosis throughout the cervical spine, worst at C6-C7. Upper chest: Fibrotic scarring and bronchiectasis of the right pulmonary apex (series 7, image 85). Other: None. IMPRESSION: 1. No acute intracranial pathology. 2. No displaced fractures or dislocations of the facial bones. 3. Soft tissue contusion of the right brow and forehead as well as the nose and lips. 4. No fracture or static subluxation of the cervical spine. 5. Moderate disc space height loss and osteophytosis throughout the cervical spine, worst at C6-C7. 6. Fibrotic scarring and bronchiectasis of the right pulmonary apex. Electronically Signed   By: Jearld Lesch M.D.   On: 09/04/2023 12:56    Procedures .Marland KitchenLaceration Repair  Date/Time: 09/04/2023 12:34 PM  Performed by: Peter Garter, PA Authorized by:  Peter Garter, PA   Consent:    Consent obtained:  Verbal   Consent given by:  Patient   Risks, benefits, and alternatives were discussed: yes     Risks discussed:  Need for additional repair, infection, poor cosmetic result, poor wound healing, pain, nerve damage, retained foreign body, tendon damage and vascular damage   Alternatives discussed:  Delayed treatment and no treatment Universal protocol:    Procedure explained and questions answered to patient or proxy's satisfaction: yes     Patient identity confirmed:  Verbally with patient and arm band Anesthesia:    Anesthesia method:  None Laceration details:    Location:  Face   Face location:  R eyebrow   Length (cm):  1.5 Pre-procedure details:    Preparation:  Imaging obtained to evaluate for foreign bodies Exploration:    Limited defect created (wound extended): no     Hemostasis achieved with:  Direct pressure   Imaging obtained comment:  Ct   Imaging outcome: foreign body not noted     Wound exploration: wound explored through full range of motion and entire depth of wound visualized     Contaminated: no   Treatment:    Area cleansed with:  Saline   Amount of cleaning:  Standard  Irrigation solution:  Sterile saline   Irrigation volume:  250cc   Irrigation method:  Syringe   Visualized foreign bodies/material removed: no     Debridement:  None   Undermining:  None   Scar revision: no   Skin repair:    Repair method:  Tissue adhesive Approximation:    Approximation:  Close Repair type:    Repair type:  Simple Post-procedure details:    Dressing:  Open (no dressing)   Procedure completion:  Tolerated well, no immediate complications     Medications Ordered in ED Medications  LORazepam (ATIVAN) tablet 1 mg (1 mg Oral Given 09/04/23 1101)  ondansetron (ZOFRAN-ODT) disintegrating tablet 8 mg (8 mg Oral Given 09/04/23 1101)  chlordiazePOXIDE (LIBRIUM) capsule 50 mg (50 mg Oral Given 09/04/23 1509)    ED  Course/ Medical Decision Making/ A&P Clinical Course as of 09/05/23 0959  Tue Sep 04, 2023  1305 Stable  12PPD ETOH. Fell yesterday. Hit face/right shoulder. Follow up XR and OKTG if negative.  [CC]    Clinical Course User Index [CC] Glyn Ade, MD                                 Medical Decision Making Amount and/or Complexity of Data Reviewed Radiology: ordered.  Risk Prescription drug management.   This patient presents to the ED for concern of fall, this involves an extensive number of treatment options, and is a complaint that carries with it a high risk of complications and morbidity.  The differential diagnosis includes CVA, fracture, strain/pain, dislocation, pneumothorax, pulmonary contusion, solid organ damage, other   Co morbidities that complicate the patient evaluation  See HPI   Additional history obtained:  Additional history obtained from EMR External records from outside source obtained and reviewed including hospital records   Lab Tests:  N/a   Imaging Studies ordered:  I ordered imaging studies including CT head cervical spine/maxillofacial, right shoulder x-ray, chest x-ray which are pending I independently visualized and interpreted imaging which showed  CT head cervical spine/maxillofacial: No acute intracranial abnormality.  No acute facial bony abnormality.  No fracture or traumatic subluxation of cervical spine.  Moderate disc height loss throughout cervical spine.  Chronic scarring and bronchiectasis right pulmonary apex.  Soft tissue swelling Right shoulder x-ray: Pending Chest x-ray: Pending I agree with the radiologist interpretation  Cardiac Monitoring: / EKG:  The patient was maintained on a cardiac monitor.  I personally viewed and interpreted the cardiac monitored which showed an underlying rhythm of: Sinus rhythm   Consultations Obtained:  N/a   Problem List / ED Course / Critical interventions / Medication  management  Fall, facial laceration I ordered medication including Zofran, Ativan, lidocaine with epinephrine   Reevaluation of the patient after these medicines showed that the patient improved I have reviewed the patients home medicines and have made adjustments as needed   Social Determinants of Health:  Denies tobacco use.  Alcohol abuse.  Polysubstance use.   Test / Admission - Considered:  Fall, facial laceration Vitals signs significant for initial tachycardia with a heart rate of 105. Otherwise within normal range and stable throughout visit. Imaging studies significant for: See above 62 year old male presents emergency department after a fall suffered last night when he was intoxicated and tripped in a gravel parking lot.  On exam, patient with facial laceration as well as abrasion to lower lip.  Imaging studies obtained which are  pending at time of shift change.  Expected discharge needs imaging studies reassuring.  Patient expressing desire for medication assisted therapy for treatment of alcohol withdrawal symptoms as he is trying to "get off of substance."  Will begin Librium taper.  Will also give patient outpatient resources for substance use.  Imaging studies pending.  At time of shift change, patient care handed off to Dr. Doran Durand.  Patient stable upon shift change.        Final Clinical Impression(s) / ED Diagnoses Final diagnoses:  ETOH abuse    Rx / DC Orders ED Discharge Orders          Ordered    chlordiazePOXIDE (LIBRIUM) 25 MG capsule  Multiple Frequencies        09/04/23 1309              Peter Garter, [place] 09/05/23 0959    Virgina Norfolk, DO 09/10/23 0700

## 2023-09-04 NOTE — ED Triage Notes (Addendum)
Arrives by [facility]. States he had too much to drink last night. He fell last night. Laceration and swelling noted above R eye, lip lac, and R shoulder pain. No blood thinners. States he drinks everyday. Last drink was yesterday. Requesting meds for withdrawal

## 2023-09-05 ENCOUNTER — Other Ambulatory Visit (HOSPITAL_BASED_OUTPATIENT_CLINIC_OR_DEPARTMENT_OTHER): Payer: Self-pay

## 2023-09-20 ENCOUNTER — Other Ambulatory Visit (INDEPENDENT_AMBULATORY_CARE_PROVIDER_SITE_OTHER): Payer: Self-pay

## 2023-09-20 ENCOUNTER — Ambulatory Visit (INDEPENDENT_AMBULATORY_CARE_PROVIDER_SITE_OTHER): Payer: 59 | Admitting: Orthopedic Surgery

## 2023-09-20 DIAGNOSIS — M25562 Pain in left knee: Secondary | ICD-10-CM

## 2023-09-20 DIAGNOSIS — M25561 Pain in right knee: Secondary | ICD-10-CM | POA: Diagnosis not present

## 2023-09-20 DIAGNOSIS — M170 Bilateral primary osteoarthritis of knee: Secondary | ICD-10-CM | POA: Diagnosis not present

## 2023-09-20 DIAGNOSIS — G8929 Other chronic pain: Secondary | ICD-10-CM | POA: Diagnosis not present

## 2023-09-25 ENCOUNTER — Encounter: Payer: Self-pay | Admitting: Orthopedic Surgery

## 2023-09-25 NOTE — Progress Notes (Signed)
Office Visit Note   Patient: [name] [name]           Date of Birth: 10/03/1961           MRN: [id number] Visit Date: 09/20/2023              Requested by: Claiborne Rigg, NP [address] [place],  [place] [address] PCP: Claiborne Rigg, NP  Chief Complaint  Patient presents with   Right Knee - Pain    Had injections 12/2021   Left Knee - Pain      HPI: Patient is a 63 year old gentleman with osteoarthritis both knees.  Patient is status post steroid injections which have not provided sufficient relief.  Patient has had decreasing range of motion pain with ambulation and decreasing function.  Patient denies tobacco use.  Assessment & Plan: Visit Diagnoses:  1. Chronic pain of both knees   2. Bilateral primary osteoarthritis of knee     Plan: Patient states he would like to proceed with a right total knee arthroplasty.  Risks and benefits were discussed including infection neurovascular injury persistent pain need for additional surgery patient states he understands wished to proceed at this time discussed the importance of not drinking alcohol postoperatively.  Follow-Up Instructions: No follow-ups on file.   Ortho Exam  Patient is alert, oriented, no adenopathy, well-dressed, normal affect, normal respiratory effort. Examination patient has an antalgic gait with varus alignment both knees there is crepitation with range of motion of both knees right worse than left there is no effusion collaterals and cruciates are stable.  Imaging: No results found. No images are attached to the encounter.  Labs: Lab Results  Component Value Date   HGBA1C 5.7 (H) 02/21/2023   HGBA1C 6.0 (H) 11/12/2022   HGBA1C 5.6 03/27/2022   ESRSEDRATE 56 (H) 03/09/2023   CRP 1 03/09/2023   REPTSTATUS 01/27/2016 FINAL 01/26/2016   CULT  11/08/2014    NO GROWTH 5 DAYS Performed at [facility]      Lab Results  Component Value Date   ALBUMIN 4.0 07/23/2023    ALBUMIN 4.6 07/19/2023   ALBUMIN 3.9 05/10/2023    Lab Results  Component Value Date   MG 2.2 11/10/2022   MG 2.1 03/27/2022   MG 2.1 12/27/2021   No results found for: "VD25OH"  No results found for: "PREALBUMIN"    Latest Ref Rng & Units 07/23/2023   10:04 AM 07/19/2023    7:49 PM 05/10/2023   11:13 AM  CBC EXTENDED  WBC 4.0 - 10.5 K/uL 5.2  5.5  5.5   RBC 4.22 - 5.81 MIL/uL 4.88  5.61  4.13   Hemoglobin 13.0 - 17.0 g/dL 63.0  16.0  10.9   HCT 39.0 - 52.0 % 40.2  45.3  34.1   Platelets 150 - 400 K/uL 147  206  117   NEUT# 1.7 - 7.7 K/uL 3.7  3.6  2.1   Lymph# 0.7 - 4.0 K/uL 0.8  1.5  2.5      There is no height or weight on file to calculate BMI.  Orders:  Orders Placed This Encounter  Procedures   XR Knee 1-2 Views Right   XR Knee 1-2 Views Left   No orders of the defined types were placed in this encounter.    Procedures: No procedures performed  Clinical Data: No additional findings.  ROS:  All other systems negative, except as noted in the HPI. Review of  Systems  Objective: Vital Signs: There were no vitals taken for this visit.  Specialty Comments:  No specialty comments available.  PMFS History: Patient Active Problem List   Diagnosis Date Noted   Alcohol use disorder, severe, dependence (HCC) 11/11/2022   Alcohol use disorder, severe, in early remission (HCC) 11/10/2022   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   Alcohol abuse with intoxication (HCC) 03/27/2022   Neuropathy 12/30/2021   Alcohol dependence with alcohol-induced mood disorder (HCC) 12/27/2021   Uncomplicated alcohol dependence (HCC) 06/16/2021   Essential hypertension 04/07/2021   Prediabetes 04/07/2021   Other hyperlipidemia 04/07/2021   Major depressive disorder, severe (HCC) 03/16/2021   History of hyperlipidemia 12/21/2020   Seborrheic dermatitis of scalp 12/21/2020   Substance abuse (HCC) 12/21/2020   Hyperopia of both eyes with astigmatism and presbyopia  12/20/2020   Posterior vitreous detachment of right eye 12/20/2020   MDD (major depressive disorder), recurrent episode, severe (HCC) 06/30/2017   Lateral epicondylitis of right elbow 01/05/2017   Strain, MCP, hand, right, initial encounter 01/05/2017   Vasomotor rhinitis 02/13/2016   Bilateral chronic knee pain 01/11/2016   Chronic pain of right wrist 07/07/2015   Carpal tunnel syndrome of right wrist 05/20/2015   Dislocation of right shoulder joint 05/20/2015   Tear of medial meniscus of right knee 03/24/2015   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HCC) 01/27/2015   Chronic cough 01/15/2015   Onychomycosis of toenail 12/15/2014   Dermatitis of face 11/11/2014   GERD (gastroesophageal reflux disease)    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HCC) 01/23/2014   Alcohol abuse 01/19/2014   Alcohol dependence (HCC) 09/02/2013   Generalized anxiety disorder 09/02/2013   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   Past Medical History:  Diagnosis Date   Alcohol abuse    Anxiety and depression    Bilateral primary osteoarthritis of knee    Carpal tunnel syndrome of right wrist Dx 2015   Cervical stenosis of spine    MRI 10/2021   Essential hypertension    Gallbladder sludge    GERD (gastroesophageal reflux disease) Dx 2007   Hepatic steatosis    History of alcoholic hepatitis    History of CVA (cerebrovascular accident)    05/2021 MRI brain: Mild chronic microvascular ischemic change in the white matter. Small chronic infarct left superior cerebellum.  Also, chronic microhemorrhage, ? 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   Sarcoidosis of lung (HCC) Dx 1989   Seborrheic dermatitis     Family History  Problem Relation Age of Onset   Diabetes Father    Hypertension  Father    Hypertension Paternal Grandfather    Alcohol abuse Paternal Grandfather    Alcohol abuse Maternal Grandfather    Alcohol abuse Maternal Grandmother    Alcohol abuse Paternal Grandmother    Colon cancer Neg Hx    Rectal cancer Neg Hx    Stomach cancer Neg Hx    Bipolar disorder Neg Hx    Depression Neg Hx     Past Surgical History:  Procedure Laterality Date   carpel tunnel release Right 07/28/2014    done in [place]    ELBOW SURGERY     KNEE ARTHROSCOPY     KNEE SURGERY Left  15 years ago   LIPOMA EXCISION N/A 05/10/2016   Procedure: EXCISION OF SCALP LIPOMA;  Surgeon: [name], MD;  Location: [facility];  Service: General;  Laterality: N/A;   Social History   Occupational History   Occupation: custodian    Comment: [employer]  Tobacco Use   Smoking status: Never   Smokeless tobacco: Never  Vaping Use   Vaping status: Never Used  Substance and Sexual Activity   Alcohol use: Yes    Comment: 2 beer last night 07-23-23   Drug use: Not Currently    Types: Marijuana    Comment: Patient denies    Sexual activity: Not Currently

## 2023-10-04 ENCOUNTER — Telehealth: Payer: Self-pay

## 2023-10-04 ENCOUNTER — Other Ambulatory Visit: Payer: Self-pay | Admitting: Nurse Practitioner

## 2023-10-04 NOTE — Telephone Encounter (Signed)
Transition Care Management Unsuccessful Follow-up Telephone Call  Date of discharge and from where:  09/04/2023 [facility]  Attempts:  1st Attempt  Reason for unsuccessful TCM follow-up call:  No answer/busy  Lakendra Helling [facility]  [facility], [facility] Guide Direct Dial: [phone]  Website: [email or web address]

## 2023-10-04 NOTE — Telephone Encounter (Signed)
Medication Refill -  Most Recent Primary Care Visit:  Provider: Anders Simmonds  Department: [facility]  Visit Type: HOSPITAL FU  Date: 05/10/2023  Medication: Rx #: [id number]  gabapentin (NEURONTIN) 300 MG capsule [[id number]]   Has the patient contacted their pharmacy? Yes (Agent: If no, request that the patient contact the pharmacy for the refill. If patient does not wish to contact the pharmacy document the reason why and proceed with request.) (Agent: If yes, when and what did the pharmacy advise?)  [facility] Phone: [phone]  Fax: [phone]      Is this the correct pharmacy for this prescription? Yes If no, delete pharmacy and type the correct one.  This is the patient's preferred pharmacy:   Has the prescription been filled recently? Yes  Is the patient out of the medication? Yes Patient reports he took his last pill yesterday.  Has the patient been seen for an appointment in the last year OR does the patient have an upcoming appointment? Yes  Can we respond through MyChart? No  Agent: Please be advised that Rx refills may take up to 3 business days. We ask that you follow-up with your pharmacy.

## 2023-10-05 ENCOUNTER — Telehealth: Payer: Self-pay

## 2023-10-05 NOTE — Telephone Encounter (Signed)
Requested medication (s) are due for refill today -yes  Requested medication (s) are on the active medication list -yes  Future visit scheduled -no  Last refill: 07/23/23  Notes to clinic: outside provider- requires PCP review for RF  Requested Prescriptions  Pending Prescriptions Disp Refills   gabapentin (NEURONTIN) 300 MG capsule 90 capsule 0    Sig: Take 1 capsule (300 mg total) by mouth 3 (three) times daily.     Neurology: Anticonvulsants - gabapentin Passed - 10/05/2023  8:05 AM      Passed - Cr in normal range and within 360 days    Creat  Date Value Ref Range Status  04/16/2015 1.16 0.50 - 1.35 mg/dL Final   Creatinine, Ser  Date Value Ref Range Status  07/23/2023 1.01 0.61 - 1.24 mg/dL Final         Passed - Completed PHQ-2 or PHQ-9 in the last 360 days      Passed - Valid encounter within last 12 months    Recent Outpatient Visits           4 months ago Loose stools   [facility] [place]. [facility], Marylene Land M, [place]   7 months ago Essential hypertension   [facility] - A Dept Of [place]. [facility] Claiborne Rigg, NP   11 months ago No-show for appointment   [facility] - A Dept Of [place]. [facility] Storm Frisk, MD   1 year ago Alcohol dependence with alcohol-induced mood disorder [facility])   [facility] - A Dept Of [place]. [facility] [place], Marzella Schlein, [place]   1 year ago Anxiety and depression   [facility] - A Dept Of [place]. [facility] Claiborne Rigg, NP                 Requested Prescriptions  Pending Prescriptions Disp Refills   gabapentin (NEURONTIN) 300 MG capsule 90 capsule 0    Sig: Take 1 capsule (300 mg total) by mouth 3 (three) times daily.     Neurology: Anticonvulsants - gabapentin Passed - 10/05/2023  8:05 AM      Passed - Cr in normal range and  within 360 days    Creat  Date Value Ref Range Status  04/16/2015 1.16 0.50 - 1.35 mg/dL Final   Creatinine, Ser  Date Value Ref Range Status  07/23/2023 1.01 0.61 - 1.24 mg/dL Final         Passed - Completed PHQ-2 or PHQ-9 in the last 360 days      Passed - Valid encounter within last 12 months    Recent Outpatient Visits           4 months ago Loose stools   [facility] - [facility] Dept Of [place]. [facility], Marylene Land M, [place]   7 months ago Essential hypertension   [facility] - A Dept Of [place]. [facility] Claiborne Rigg, NP   11 months ago No-show for appointment   [facility] - A Dept Of [place]. [facility] Storm Frisk, MD   1 year ago Alcohol dependence with alcohol-induced mood disorder [facility])   [facility] - A Dept Of [place]. [facility] [place], [place]  M, PA-C   1 year ago Anxiety and depression   [facility] [place] - A Dept Of [place]. [facility] Claiborne Rigg, [place]

## 2023-10-05 NOTE — Telephone Encounter (Signed)
Transition Care Management Unsuccessful Follow-up Telephone Call  Date of discharge and from where:  09/04/2023 [facility]  Attempts:  2nd Attempt  Reason for unsuccessful TCM follow-up call:  Unable to leave message voicemail does not pickup.  Symon Norwood [facility]  [facility], [facility] Guide Direct Dial: [phone]  Website: [email or web address]

## 2023-10-07 MED ORDER — GABAPENTIN 300 MG PO CAPS
300.0000 mg | ORAL_CAPSULE | Freq: Three times a day (TID) | ORAL | 0 refills | Status: DC
  Filled 2023-10-07 – 2024-01-11 (×2): qty 90, 30d supply, fill #0

## 2023-10-08 ENCOUNTER — Emergency Department (HOSPITAL_COMMUNITY): Payer: MEDICAID

## 2023-10-08 ENCOUNTER — Other Ambulatory Visit: Payer: Self-pay

## 2023-10-08 ENCOUNTER — Encounter (HOSPITAL_COMMUNITY): Admission: EM | Disposition: A | Discharge status: Skilled Nursing Facility | Payer: Self-pay | Source: Home / Self Care

## 2023-10-08 ENCOUNTER — Inpatient Hospital Stay (HOSPITAL_COMMUNITY): Payer: MEDICAID | Admitting: Certified Registered Nurse Anesthetist

## 2023-10-08 ENCOUNTER — Inpatient Hospital Stay (HOSPITAL_COMMUNITY)
Admission: EM | Admit: 2023-10-08 | Discharge: 2023-12-17 | DRG: 956 | Disposition: A | Discharge status: Skilled Nursing Facility | Payer: MEDICAID | Attending: General Surgery | Admitting: General Surgery

## 2023-10-08 DIAGNOSIS — Z9889 Other specified postprocedural states: Secondary | ICD-10-CM

## 2023-10-08 DIAGNOSIS — N3289 Other specified disorders of bladder: Secondary | ICD-10-CM

## 2023-10-08 DIAGNOSIS — S2509XA Other specified injury of thoracic aorta, initial encounter: Secondary | ICD-10-CM

## 2023-10-08 DIAGNOSIS — F430 Acute stress reaction: Secondary | ICD-10-CM | POA: Diagnosis present

## 2023-10-08 DIAGNOSIS — S329XXA Fracture of unspecified parts of lumbosacral spine and pelvis, initial encounter for closed fracture: Secondary | ICD-10-CM

## 2023-10-08 DIAGNOSIS — E440 Moderate protein-calorie malnutrition: Secondary | ICD-10-CM | POA: Diagnosis present

## 2023-10-08 DIAGNOSIS — S3729XA Other injury of bladder, initial encounter: Secondary | ICD-10-CM

## 2023-10-08 DIAGNOSIS — S2231XA Fracture of one rib, right side, initial encounter for closed fracture: Secondary | ICD-10-CM

## 2023-10-08 DIAGNOSIS — S3210XA Unspecified fracture of sacrum, initial encounter for closed fracture: Secondary | ICD-10-CM | POA: Diagnosis present

## 2023-10-08 DIAGNOSIS — T8489XA Other specified complication of internal orthopedic prosthetic devices, implants and grafts, initial encounter: Secondary | ICD-10-CM | POA: Diagnosis not present

## 2023-10-08 DIAGNOSIS — J9601 Acute respiratory failure with hypoxia: Secondary | ICD-10-CM | POA: Diagnosis not present

## 2023-10-08 DIAGNOSIS — S2502XA Major laceration of thoracic aorta, initial encounter: Principal | ICD-10-CM | POA: Diagnosis present

## 2023-10-08 DIAGNOSIS — S32301A Unspecified fracture of right ilium, initial encounter for closed fracture: Secondary | ICD-10-CM | POA: Diagnosis present

## 2023-10-08 DIAGNOSIS — S32431A Displaced fracture of anterior column [iliopubic] of right acetabulum, initial encounter for closed fracture: Secondary | ICD-10-CM | POA: Diagnosis present

## 2023-10-08 DIAGNOSIS — L89611 Pressure ulcer of right heel, stage 1: Secondary | ICD-10-CM | POA: Diagnosis not present

## 2023-10-08 DIAGNOSIS — E870 Hyperosmolality and hypernatremia: Secondary | ICD-10-CM | POA: Diagnosis not present

## 2023-10-08 DIAGNOSIS — S32441A Displaced fracture of posterior column [ilioischial] of right acetabulum, initial encounter for closed fracture: Secondary | ICD-10-CM | POA: Diagnosis present

## 2023-10-08 DIAGNOSIS — S32512A Fracture of superior rim of left pubis, initial encounter for closed fracture: Secondary | ICD-10-CM | POA: Diagnosis present

## 2023-10-08 DIAGNOSIS — Z751 Person awaiting admission to adequate facility elsewhere: Secondary | ICD-10-CM

## 2023-10-08 DIAGNOSIS — S32511A Fracture of superior rim of right pubis, initial encounter for closed fracture: Secondary | ICD-10-CM | POA: Diagnosis present

## 2023-10-08 DIAGNOSIS — S32591A Other specified fracture of right pubis, initial encounter for closed fracture: Secondary | ICD-10-CM | POA: Diagnosis present

## 2023-10-08 DIAGNOSIS — S32009A Unspecified fracture of unspecified lumbar vertebra, initial encounter for closed fracture: Secondary | ICD-10-CM | POA: Diagnosis present

## 2023-10-08 DIAGNOSIS — S35512A Injury of left iliac artery, initial encounter: Secondary | ICD-10-CM | POA: Diagnosis present

## 2023-10-08 DIAGNOSIS — R110 Nausea: Secondary | ICD-10-CM | POA: Diagnosis not present

## 2023-10-08 DIAGNOSIS — S7001XA Contusion of right hip, initial encounter: Secondary | ICD-10-CM | POA: Diagnosis present

## 2023-10-08 DIAGNOSIS — R339 Retention of urine, unspecified: Secondary | ICD-10-CM | POA: Diagnosis not present

## 2023-10-08 DIAGNOSIS — S3681XA Injury of peritoneum, initial encounter: Secondary | ICD-10-CM | POA: Diagnosis present

## 2023-10-08 DIAGNOSIS — R451 Restlessness and agitation: Secondary | ICD-10-CM | POA: Diagnosis not present

## 2023-10-08 DIAGNOSIS — R54 Age-related physical debility: Secondary | ICD-10-CM | POA: Diagnosis present

## 2023-10-08 DIAGNOSIS — F1010 Alcohol abuse, uncomplicated: Secondary | ICD-10-CM | POA: Diagnosis present

## 2023-10-08 DIAGNOSIS — S0003XA Contusion of scalp, initial encounter: Secondary | ICD-10-CM | POA: Diagnosis present

## 2023-10-08 DIAGNOSIS — T794XXA Traumatic shock, initial encounter: Secondary | ICD-10-CM | POA: Diagnosis present

## 2023-10-08 DIAGNOSIS — F411 Generalized anxiety disorder: Secondary | ICD-10-CM | POA: Diagnosis not present

## 2023-10-08 DIAGNOSIS — S32592A Other specified fracture of left pubis, initial encounter for closed fracture: Secondary | ICD-10-CM | POA: Diagnosis present

## 2023-10-08 DIAGNOSIS — J9584 Transfusion-related acute lung injury (TRALI): Secondary | ICD-10-CM | POA: Diagnosis not present

## 2023-10-08 DIAGNOSIS — S300XXA Contusion of lower back and pelvis, initial encounter: Secondary | ICD-10-CM | POA: Diagnosis present

## 2023-10-08 DIAGNOSIS — M62838 Other muscle spasm: Secondary | ICD-10-CM | POA: Diagnosis not present

## 2023-10-08 DIAGNOSIS — S36899A Unspecified injury of other intra-abdominal organs, initial encounter: Secondary | ICD-10-CM | POA: Diagnosis not present

## 2023-10-08 DIAGNOSIS — Z681 Body mass index (BMI) 19 or less, adult: Secondary | ICD-10-CM

## 2023-10-08 DIAGNOSIS — H532 Diplopia: Secondary | ICD-10-CM | POA: Diagnosis present

## 2023-10-08 DIAGNOSIS — J150 Pneumonia due to Klebsiella pneumoniae: Secondary | ICD-10-CM | POA: Diagnosis not present

## 2023-10-08 DIAGNOSIS — K683 Retroperitoneal hematoma: Secondary | ICD-10-CM | POA: Diagnosis present

## 2023-10-08 DIAGNOSIS — E878 Other disorders of electrolyte and fluid balance, not elsewhere classified: Secondary | ICD-10-CM | POA: Diagnosis not present

## 2023-10-08 DIAGNOSIS — Y838 Other surgical procedures as the cause of abnormal reaction of the patient, or of later complication, without mention of misadventure at the time of the procedure: Secondary | ICD-10-CM | POA: Diagnosis not present

## 2023-10-08 DIAGNOSIS — S35511A Injury of right iliac artery, initial encounter: Secondary | ICD-10-CM | POA: Diagnosis present

## 2023-10-08 DIAGNOSIS — F329 Major depressive disorder, single episode, unspecified: Secondary | ICD-10-CM | POA: Diagnosis not present

## 2023-10-08 DIAGNOSIS — S0001XA Abrasion of scalp, initial encounter: Secondary | ICD-10-CM | POA: Diagnosis present

## 2023-10-08 DIAGNOSIS — G4700 Insomnia, unspecified: Secondary | ICD-10-CM | POA: Diagnosis not present

## 2023-10-08 DIAGNOSIS — I7122 Aneurysm of the aortic arch, without rupture: Secondary | ICD-10-CM | POA: Diagnosis not present

## 2023-10-08 DIAGNOSIS — D62 Acute posthemorrhagic anemia: Secondary | ICD-10-CM | POA: Diagnosis present

## 2023-10-08 DIAGNOSIS — J9588 Other intraoperative complications of respiratory system, not elsewhere classified: Secondary | ICD-10-CM | POA: Diagnosis not present

## 2023-10-08 DIAGNOSIS — Y831 Surgical operation with implant of artificial internal device as the cause of abnormal reaction of the patient, or of later complication, without mention of misadventure at the time of the procedure: Secondary | ICD-10-CM | POA: Diagnosis not present

## 2023-10-08 DIAGNOSIS — S31109A Unspecified open wound of abdominal wall, unspecified quadrant without penetration into peritoneal cavity, initial encounter: Secondary | ICD-10-CM | POA: Diagnosis not present

## 2023-10-08 DIAGNOSIS — Z23 Encounter for immunization: Secondary | ICD-10-CM

## 2023-10-08 DIAGNOSIS — G629 Polyneuropathy, unspecified: Secondary | ICD-10-CM | POA: Diagnosis present

## 2023-10-08 DIAGNOSIS — E876 Hypokalemia: Secondary | ICD-10-CM | POA: Diagnosis not present

## 2023-10-08 DIAGNOSIS — S32401A Unspecified fracture of right acetabulum, initial encounter for closed fracture: Secondary | ICD-10-CM | POA: Diagnosis not present

## 2023-10-08 HISTORY — PX: INSERTION OF TRACTION PIN: SHX6560

## 2023-10-08 HISTORY — PX: LAPAROTOMY: SHX154

## 2023-10-08 HISTORY — PX: WOUND EXPLORATION: SHX6188

## 2023-10-08 LAB — MASSIVE TRANSFUSION PROTOCOL ORDER (BLOOD BANK NOTIFICATION)

## 2023-10-08 LAB — ABO/RH: ABO/RH(D): A POS

## 2023-10-08 SURGERY — LAPAROTOMY, EXPLORATORY
Anesthesia: General | Site: Leg Upper | Laterality: Right

## 2023-10-08 MED ORDER — CALCIUM GLUCONATE 10 % IV SOLN
1.0000 g | Freq: Once | INTRAVENOUS | Status: AC
  Administered 2023-10-08: 1 g via INTRAVENOUS

## 2023-10-08 MED ORDER — CEFAZOLIN SODIUM 1 G IJ SOLR
INTRAMUSCULAR | Status: AC
  Filled 2023-10-08: qty 20

## 2023-10-08 MED ORDER — SUCCINYLCHOLINE CHLORIDE 200 MG/10ML IV SOSY
140.0000 mg | PREFILLED_SYRINGE | Freq: Once | INTRAVENOUS | Status: AC
  Administered 2023-10-08: 140 mg via INTRAVENOUS

## 2023-10-08 MED ORDER — TRANEXAMIC ACID 1000 MG/10ML IV SOLN
1000.0000 mg | Freq: Once | INTRAVENOUS | Status: AC
  Administered 2023-10-08: 1000 mg via INTRAVENOUS
  Filled 2023-10-08: qty 10

## 2023-10-08 MED ORDER — POLYETHYLENE GLYCOL 3350 17 G PO PACK
17.0000 g | PACK | Freq: Every day | ORAL | Status: DC
  Administered 2023-10-09 – 2023-11-03 (×15): 17 g
  Filled 2023-10-08 (×18): qty 1

## 2023-10-08 MED ORDER — MIDAZOLAM HCL 5 MG/5ML IJ SOLN
INTRAMUSCULAR | Status: DC | PRN
  Administered 2023-10-08 – 2023-10-09 (×2): 2 mg via INTRAVENOUS

## 2023-10-08 MED ORDER — TRANEXAMIC ACID-NACL 1000-0.7 MG/100ML-% IV SOLN
1000.0000 mg | Freq: Once | INTRAVENOUS | Status: AC
  Administered 2023-10-08: 1000 mg via INTRAVENOUS

## 2023-10-08 MED ORDER — DOCUSATE SODIUM 50 MG/5ML PO LIQD
100.0000 mg | Freq: Two times a day (BID) | ORAL | Status: DC
  Administered 2023-10-09 – 2023-11-05 (×31): 100 mg
  Filled 2023-10-08 (×36): qty 10

## 2023-10-08 MED ORDER — CALCIUM CHLORIDE 10 % IV SOLN
1.0000 g | Freq: Once | INTRAVENOUS | Status: AC
  Administered 2023-10-08: 1 g via INTRAVENOUS

## 2023-10-08 MED ORDER — KETAMINE HCL 50 MG/5ML IJ SOSY
PREFILLED_SYRINGE | INTRAMUSCULAR | Status: AC
  Filled 2023-10-08: qty 5

## 2023-10-08 MED ORDER — PROPOFOL 10 MG/ML IV BOLUS
INTRAVENOUS | Status: AC
  Filled 2023-10-08: qty 20

## 2023-10-08 MED ORDER — CALCIUM CHLORIDE 10 % IV SOLN
INTRAVENOUS | Status: DC | PRN
  Administered 2023-10-08: 500 mg via INTRAVENOUS
  Administered 2023-10-08: 300 mg via INTRAVENOUS
  Administered 2023-10-08: 400 mg via INTRAVENOUS
  Administered 2023-10-08: 300 mg via INTRAVENOUS
  Administered 2023-10-09: 400 mg via INTRAVENOUS

## 2023-10-08 MED ORDER — FENTANYL 2500MCG IN NS 250ML (10MCG/ML) PREMIX INFUSION
0.0000 ug/h | INTRAVENOUS | Status: AC
  Administered 2023-10-08 (×2): 100 ug/h via INTRAVENOUS
  Administered 2023-10-09 – 2023-10-16 (×15): 200 ug/h via INTRAVENOUS
  Administered 2023-10-16: 300 ug/h via INTRAVENOUS
  Administered 2023-10-16 (×2): 400 ug/h via INTRAVENOUS
  Administered 2023-10-17 (×3): 350 ug/h via INTRAVENOUS
  Administered 2023-10-18 (×2): 300 ug/h via INTRAVENOUS
  Administered 2023-10-18: 350 ug/h via INTRAVENOUS
  Administered 2023-10-18: 300 ug/h via INTRAVENOUS
  Administered 2023-10-19: 400 ug/h via INTRAVENOUS
  Administered 2023-10-19: 300 ug/h via INTRAVENOUS
  Administered 2023-10-19: 400 ug/h via INTRAVENOUS
  Administered 2023-10-19: 350 ug/h via INTRAVENOUS
  Administered 2023-10-20: 300 ug/h via INTRAVENOUS
  Filled 2023-10-08 (×33): qty 250

## 2023-10-08 MED ORDER — NOREPINEPHRINE 4 MG/250ML-% IV SOLN
INTRAVENOUS | Status: DC | PRN
  Administered 2023-10-08: 60 ug/min via INTRAVENOUS

## 2023-10-08 MED ORDER — LACTATED RINGERS IV SOLN: INTRAVENOUS | Status: DC | PRN

## 2023-10-08 MED ORDER — CEFAZOLIN SODIUM-DEXTROSE 2-3 GM-%(50ML) IV SOLR
INTRAVENOUS | Status: DC | PRN
  Administered 2023-10-08: 2 g via INTRAVENOUS

## 2023-10-08 MED ORDER — ROCURONIUM BROMIDE 100 MG/10ML IV SOLN
INTRAVENOUS | Status: DC | PRN
  Administered 2023-10-08 – 2023-10-09 (×5): 100 mg via INTRAVENOUS

## 2023-10-08 MED ORDER — FENTANYL CITRATE PF 50 MCG/ML IJ SOSY
PREFILLED_SYRINGE | INTRAMUSCULAR | Status: AC
  Administered 2023-10-08: 50 ug via INTRAVENOUS
  Filled 2023-10-08: qty 1

## 2023-10-08 MED ORDER — SODIUM CHLORIDE 0.9 % IV SOLN: INTRAVENOUS | Status: DC | PRN

## 2023-10-08 MED ORDER — ALBUMIN HUMAN 5 % IV SOLN: INTRAVENOUS | Status: DC | PRN

## 2023-10-08 MED ORDER — SODIUM BICARBONATE 8.4 % IV SOLN
INTRAVENOUS | Status: AC
  Filled 2023-10-08: qty 50

## 2023-10-08 MED ORDER — MIDAZOLAM HCL 2 MG/2ML IJ SOLN
1.0000 mg | INTRAMUSCULAR | Status: DC | PRN
  Administered 2023-10-08 – 2023-10-20 (×6): 2 mg via INTRAVENOUS
  Filled 2023-10-08 (×7): qty 2

## 2023-10-08 MED ORDER — ETOMIDATE 2 MG/ML IV SOLN
20.0000 mg | Freq: Once | INTRAVENOUS | Status: AC
  Administered 2023-10-08: 20 mg via INTRAVENOUS

## 2023-10-08 MED ORDER — SODIUM BICARBONATE 8.4 % IV SOLN
INTRAVENOUS | Status: DC | PRN
  Administered 2023-10-08 – 2023-10-09 (×2): 50 mL via INTRAVENOUS

## 2023-10-08 MED ORDER — IOHEXOL 350 MG/ML SOLN
85.0000 mL | Freq: Once | INTRAVENOUS | Status: AC | PRN
  Administered 2023-10-08: 75 mL via INTRAVENOUS

## 2023-10-08 MED ORDER — MIDAZOLAM HCL 2 MG/2ML IJ SOLN
INTRAMUSCULAR | Status: AC
  Filled 2023-10-08: qty 2

## 2023-10-08 MED ORDER — KETAMINE HCL 50 MG/5ML IJ SOSY
PREFILLED_SYRINGE | INTRAMUSCULAR | Status: AC
  Filled 2023-10-08: qty 10

## 2023-10-08 MED ORDER — FENTANYL BOLUS VIA INFUSION
50.0000 ug | INTRAVENOUS | Status: DC | PRN
  Administered 2023-10-09: 100 ug via INTRAVENOUS
  Administered 2023-10-09 (×2): 50 ug via INTRAVENOUS
  Administered 2023-10-10 – 2023-10-11 (×5): 100 ug via INTRAVENOUS
  Administered 2023-10-12: 50 ug via INTRAVENOUS
  Administered 2023-10-13 (×2): 100 ug via INTRAVENOUS
  Administered 2023-10-15: 50 ug via INTRAVENOUS
  Administered 2023-10-16: 75 ug via INTRAVENOUS
  Administered 2023-10-18 (×3): 100 ug via INTRAVENOUS
  Administered 2023-10-18: 50 ug via INTRAVENOUS
  Administered 2023-10-18 – 2023-10-20 (×7): 100 ug via INTRAVENOUS
  Administered 2023-10-20: 50 ug via INTRAVENOUS
  Administered 2023-10-20: 100 ug via INTRAVENOUS

## 2023-10-08 SURGICAL SUPPLY — 58 items
APL PRP STRL LF DISP 70% ISPRP (MISCELLANEOUS) ×3
BAG DRN RND TRDRP ANRFLXCHMBR (UROLOGICAL SUPPLIES) ×3
BAG URINE DRAIN 2000ML AR STRL (UROLOGICAL SUPPLIES) IMPLANT
BLADE CLIPPER SURG (BLADE) IMPLANT
BNDG CMPR 5X6 CHSV STRCH STRL (GAUZE/BANDAGES/DRESSINGS) ×3
BNDG COHESIVE 6X5 TAN ST LF (GAUZE/BANDAGES/DRESSINGS) IMPLANT
BNDG GAUZE DERMACEA FLUFF 4 (GAUZE/BANDAGES/DRESSINGS) IMPLANT
BNDG GZE DERMACEA 4 6PLY (GAUZE/BANDAGES/DRESSINGS) ×15
CANISTER SUCT 3000ML PPV (MISCELLANEOUS) ×4 IMPLANT
CHLORAPREP W/TINT 26 (MISCELLANEOUS) ×4 IMPLANT
COVER SURGICAL LIGHT HANDLE (MISCELLANEOUS) ×4 IMPLANT
DRAPE EXTREMITY T 121X128X90 (DISPOSABLE) IMPLANT
DRAPE LAPAROSCOPIC ABDOMINAL (DRAPES) ×4 IMPLANT
DRAPE WARM FLUID 44X44 (DRAPES) ×4 IMPLANT
DRSG OPSITE POSTOP 4X10 (GAUZE/BANDAGES/DRESSINGS) IMPLANT
DRSG OPSITE POSTOP 4X8 (GAUZE/BANDAGES/DRESSINGS) IMPLANT
DRSG XEROFORM 1X8 (GAUZE/BANDAGES/DRESSINGS) IMPLANT
ELECT BLADE 6.5 EXT (BLADE) IMPLANT
ELECT CAUTERY BLADE 6.4 (BLADE) ×4 IMPLANT
ELECT REM PT RETURN 9FT ADLT (ELECTROSURGICAL) ×3
ELECTRODE REM PT RTRN 9FT ADLT (ELECTROSURGICAL) ×4 IMPLANT
GAUZE PAD ABD 8X10 STRL (GAUZE/BANDAGES/DRESSINGS) IMPLANT
GLOVE BIO SURGEON STRL SZ7.5 (GLOVE) ×4 IMPLANT
GLOVE BIOGEL PI IND STRL 6.5 (GLOVE) IMPLANT
GLOVE BIOGEL PI IND STRL 7.0 (GLOVE) IMPLANT
GLOVE BIOGEL PI IND STRL 8 (GLOVE) ×4 IMPLANT
GOWN STRL REUS W/ TWL LRG LVL3 (GOWN DISPOSABLE) ×4 IMPLANT
GOWN STRL REUS W/ TWL XL LVL3 (GOWN DISPOSABLE) ×4 IMPLANT
GOWN STRL REUS W/TWL LRG LVL3 (GOWN DISPOSABLE) ×3
GOWN STRL REUS W/TWL XL LVL3 (GOWN DISPOSABLE) ×3
HANDLE SUCTION POOLE (INSTRUMENTS) ×4 IMPLANT
KIT BASIN OR (CUSTOM PROCEDURE TRAY) ×4 IMPLANT
KIT TURNOVER KIT B (KITS) ×4 IMPLANT
LIGASURE IMPACT 36 18CM CVD LR (INSTRUMENTS) IMPLANT
MESH VICRYL KNITTED 12X12 (Mesh General) IMPLANT
NS IRRIG 1000ML POUR BTL (IV SOLUTION) ×8 IMPLANT
PACK GENERAL/GYN (CUSTOM PROCEDURE TRAY) ×4 IMPLANT
PAD ARMBOARD 7.5X6 YLW CONV (MISCELLANEOUS) ×4 IMPLANT
PENCIL SMOKE EVACUATOR (MISCELLANEOUS) ×4 IMPLANT
SPECIMEN JAR LARGE (MISCELLANEOUS) IMPLANT
SPONGE ABD ABTHERA ADVANCE (MISCELLANEOUS) IMPLANT
SPONGE T-LAP 18X18 ~~LOC~~+RFID (SPONGE) IMPLANT
STAPLER VISISTAT 35W (STAPLE) ×4 IMPLANT
STOCKINETTE IMPERVIOUS LG (DRAPES) IMPLANT
SUCTION POOLE HANDLE (INSTRUMENTS)
SUT PDS AB 0 CT 36 (SUTURE) IMPLANT
SUT PDS AB 1 TP1 54 (SUTURE) ×8 IMPLANT
SUT SILK 2 0 SH CR/8 (SUTURE) ×4 IMPLANT
SUT SILK 2 0 TIES 10X30 (SUTURE) ×4 IMPLANT
SUT SILK 3 0 SH CR/8 (SUTURE) ×4 IMPLANT
SUT SILK 3 0 TIES 10X30 (SUTURE) ×4 IMPLANT
SUT VIC AB 0 CT1 27 (SUTURE) ×15
SUT VIC AB 0 CT1 27XBRD ANBCTR (SUTURE) IMPLANT
SUT VIC AB CT1 27XBRD ANBCTRL (SUTURE) ×15
TAPE CLOTH SURG 6X10 NS LF (GAUZE/BANDAGES/DRESSINGS) IMPLANT
TOWEL GREEN STERILE (TOWEL DISPOSABLE) ×4 IMPLANT
TRAY FOLEY MTR SLVR 16FR STAT (SET/KITS/TRAYS/PACK) ×4 IMPLANT
YANKAUER SUCT BULB TIP NO VENT (SUCTIONS) IMPLANT
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
## 2023-10-08 NOTE — ED Notes (Signed)
Transported patient to OR with 1 other RN, RT, and NT. Patient stable the whole time and care was handed off to OR team.
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
## 2023-10-08 NOTE — Anesthesia Procedure Notes (Signed)
Arterial Line Insertion Start/End11/09/2023 10:47 PM Performed by: Jairo Ben, MD, Rachel Moulds, CRNA, anesthesiologist  Patient location: OR. Preanesthetic checklist: patient identified, IV checked, monitors and equipment checked, pre-op evaluation and timeout performed Left, radial was placed Catheter size: 20 G Hand hygiene performed   Attempts: 1 (after CRNA) Procedure performed without using ultrasound guided technique. Following insertion, dressing applied and Biopatch. Post procedure assessment: normal and unchanged  Post procedure complications: second provider assisted. Patient tolerated the procedure well with no immediate complications.
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
## 2023-10-08 NOTE — Progress Notes (Signed)
Orthopedic Tech Progress Note Patient Details:  [name] [name] 01/14/1961 [id number]  Patient ID: [name], male   DOB: 26-Nov-1961, 62 y.o.   MRN: [id number] I attended trauma page. Trinna Post 10/08/2023, 9:23 PM
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
## 2023-10-08 NOTE — ED Notes (Signed)
Patient transported to CT 

## 2023-10-09 ENCOUNTER — Inpatient Hospital Stay (HOSPITAL_COMMUNITY): Payer: MEDICAID

## 2023-10-09 ENCOUNTER — Encounter (HOSPITAL_COMMUNITY): Admission: EM | Disposition: A | Discharge status: Skilled Nursing Facility | Payer: Self-pay | Source: Home / Self Care

## 2023-10-09 DIAGNOSIS — S2509XA Other specified injury of thoracic aorta, initial encounter: Secondary | ICD-10-CM

## 2023-10-09 DIAGNOSIS — I7122 Aneurysm of the aortic arch, without rupture: Secondary | ICD-10-CM

## 2023-10-09 HISTORY — PX: IR ANGIOGRAM PELVIS SELECTIVE OR SUPRASELECTIVE: IMG661

## 2023-10-09 HISTORY — PX: IR HYBRID TRAUMA EMBOLIZATION: IMG5539

## 2023-10-09 HISTORY — PX: THORACIC AORTIC ENDOVASCULAR STENT GRAFT: SHX6112

## 2023-10-09 HISTORY — PX: IR US GUIDE VASC ACCESS LEFT: IMG2389

## 2023-10-09 HISTORY — PX: IR ANGIOGRAM SELECTIVE EACH ADDITIONAL VESSEL: IMG667

## 2023-10-09 HISTORY — PX: EMBOLIZATION: SHX5507

## 2023-10-09 LAB — POCT I-STAT 7, (LYTES, BLD GAS, ICA,H+H)
Acid-Base Excess: 1 mmol/L (ref 0.0–2.0)
Acid-base deficit: 10 mmol/L — ABNORMAL HIGH (ref 0.0–2.0)
Acid-base deficit: 10 mmol/L — ABNORMAL HIGH (ref 0.0–2.0)
Acid-base deficit: 7 mmol/L — ABNORMAL HIGH (ref 0.0–2.0)
Acid-base deficit: 7 mmol/L — ABNORMAL HIGH (ref 0.0–2.0)
Acid-base deficit: 8 mmol/L — ABNORMAL HIGH (ref 0.0–2.0)
Acid-base deficit: 9 mmol/L — ABNORMAL HIGH (ref 0.0–2.0)
Acid-base deficit: 9 mmol/L — ABNORMAL HIGH (ref 0.0–2.0)
Acid-base deficit: 9 mmol/L — ABNORMAL HIGH (ref 0.0–2.0)
Acid-base deficit: 5 mmol/L — ABNORMAL HIGH (ref 0.0–2.0)
Bicarbonate: 17.6 mmol/L — ABNORMAL LOW (ref 20.0–28.0)
Bicarbonate: 17.6 mmol/L — ABNORMAL LOW (ref 20.0–28.0)
Bicarbonate: 17.7 mmol/L — ABNORMAL LOW (ref 20.0–28.0)
Bicarbonate: 18.1 mmol/L — ABNORMAL LOW (ref 20.0–28.0)
Bicarbonate: 19.1 mmol/L — ABNORMAL LOW (ref 20.0–28.0)
Bicarbonate: 19.1 mmol/L — ABNORMAL LOW (ref 20.0–28.0)
Bicarbonate: 20.1 mmol/L (ref 20.0–28.0)
Bicarbonate: 21.2 mmol/L (ref 20.0–28.0)
Bicarbonate: 21.5 mmol/L (ref 20.0–28.0)
Bicarbonate: 26.6 mmol/L (ref 20.0–28.0)
Calcium, Ion: 0.6 mmol/L — CL (ref 1.15–1.40)
Calcium, Ion: 0.72 mmol/L — CL (ref 1.15–1.40)
Calcium, Ion: 0.74 mmol/L — CL (ref 1.15–1.40)
Calcium, Ion: 0.76 mmol/L — CL (ref 1.15–1.40)
Calcium, Ion: 0.79 mmol/L — CL (ref 1.15–1.40)
Calcium, Ion: 0.81 mmol/L — CL (ref 1.15–1.40)
Calcium, Ion: 0.87 mmol/L — CL (ref 1.15–1.40)
Calcium, Ion: 1.02 mmol/L — ABNORMAL LOW (ref 1.15–1.40)
Calcium, Ion: 1.04 mmol/L — ABNORMAL LOW (ref 1.15–1.40)
Calcium, Ion: 0.99 mmol/L — ABNORMAL LOW (ref 1.15–1.40)
HCT: 26 % — ABNORMAL LOW (ref 39.0–52.0)
HCT: 26 % — ABNORMAL LOW (ref 39.0–52.0)
HCT: 26 % — ABNORMAL LOW (ref 39.0–52.0)
HCT: 26 % — ABNORMAL LOW (ref 39.0–52.0)
HCT: 29 % — ABNORMAL LOW (ref 39.0–52.0)
HCT: 29 % — ABNORMAL LOW (ref 39.0–52.0)
HCT: 30 % — ABNORMAL LOW (ref 39.0–52.0)
HCT: 32 % — ABNORMAL LOW (ref 39.0–52.0)
HCT: 31 % — ABNORMAL LOW (ref 39.0–52.0)
HCT: 23 % — ABNORMAL LOW (ref 39.0–52.0)
Hemoglobin: 10.2 g/dL — ABNORMAL LOW (ref 13.0–17.0)
Hemoglobin: 10.9 g/dL — ABNORMAL LOW (ref 13.0–17.0)
Hemoglobin: 8.8 g/dL — ABNORMAL LOW (ref 13.0–17.0)
Hemoglobin: 8.8 g/dL — ABNORMAL LOW (ref 13.0–17.0)
Hemoglobin: 8.8 g/dL — ABNORMAL LOW (ref 13.0–17.0)
Hemoglobin: 8.8 g/dL — ABNORMAL LOW (ref 13.0–17.0)
Hemoglobin: 9.9 g/dL — ABNORMAL LOW (ref 13.0–17.0)
Hemoglobin: 9.9 g/dL — ABNORMAL LOW (ref 13.0–17.0)
Hemoglobin: 10.5 g/dL — ABNORMAL LOW (ref 13.0–17.0)
Hemoglobin: 7.8 g/dL — ABNORMAL LOW (ref 13.0–17.0)
O2 Saturation: 100 %
O2 Saturation: 100 %
O2 Saturation: 100 %
O2 Saturation: 61 %
O2 Saturation: 72 %
O2 Saturation: 84 %
O2 Saturation: 89 %
O2 Saturation: 96 %
O2 Saturation: 93 %
O2 Saturation: 100 %
Patient temperature: 33.4
Patient temperature: 33.4
Patient temperature: 33.5
Patient temperature: 33.7
Patient temperature: 34.1
Patient temperature: 34.4
Patient temperature: 34.4
Patient temperature: 34.5
Patient temperature: 36
Patient temperature: 37.5
Potassium: 4.3 mmol/L (ref 3.5–5.1)
Potassium: 4.6 mmol/L (ref 3.5–5.1)
Potassium: 4.7 mmol/L (ref 3.5–5.1)
Potassium: 5 mmol/L (ref 3.5–5.1)
Potassium: 5 mmol/L (ref 3.5–5.1)
Potassium: 5.1 mmol/L (ref 3.5–5.1)
Potassium: 5.1 mmol/L (ref 3.5–5.1)
Potassium: 5.1 mmol/L (ref 3.5–5.1)
Potassium: 4.2 mmol/L (ref 3.5–5.1)
Potassium: 3.9 mmol/L (ref 3.5–5.1)
Sodium: 137 mmol/L (ref 135–145)
Sodium: 137 mmol/L (ref 135–145)
Sodium: 138 mmol/L (ref 135–145)
Sodium: 139 mmol/L (ref 135–145)
Sodium: 140 mmol/L (ref 135–145)
Sodium: 141 mmol/L (ref 135–145)
Sodium: 141 mmol/L (ref 135–145)
Sodium: 142 mmol/L (ref 135–145)
Sodium: 142 mmol/L (ref 135–145)
Sodium: 143 mmol/L (ref 135–145)
TCO2: 19 mmol/L — ABNORMAL LOW (ref 22–32)
TCO2: 19 mmol/L — ABNORMAL LOW (ref 22–32)
TCO2: 19 mmol/L — ABNORMAL LOW (ref 22–32)
TCO2: 19 mmol/L — ABNORMAL LOW (ref 22–32)
TCO2: 20 mmol/L — ABNORMAL LOW (ref 22–32)
TCO2: 21 mmol/L — ABNORMAL LOW (ref 22–32)
TCO2: 21 mmol/L — ABNORMAL LOW (ref 22–32)
TCO2: 23 mmol/L (ref 22–32)
TCO2: 23 mmol/L (ref 22–32)
TCO2: 28 mmol/L (ref 22–32)
pCO2 arterial: 33.3 mmHg (ref 32–48)
pCO2 arterial: 34.8 mmHg (ref 32–48)
pCO2 arterial: 35.9 mmHg (ref 32–48)
pCO2 arterial: 38 mmHg (ref 32–48)
pCO2 arterial: 41.2 mmHg (ref 32–48)
pCO2 arterial: 42.2 mmHg (ref 32–48)
pCO2 arterial: 52.1 mmHg — ABNORMAL HIGH (ref 32–48)
pCO2 arterial: 55.1 mmHg — ABNORMAL HIGH (ref 32–48)
pCO2 arterial: 43.1 mmHg (ref 32–48)
pCO2 arterial: 47.9 mmHg (ref 32–48)
pH, Arterial: 7.152 — CL (ref 7.35–7.45)
pH, Arterial: 7.176 — CL (ref 7.35–7.45)
pH, Arterial: 7.214 — ABNORMAL LOW (ref 7.35–7.45)
pH, Arterial: 7.282 — ABNORMAL LOW (ref 7.35–7.45)
pH, Arterial: 7.29 — ABNORMAL LOW (ref 7.35–7.45)
pH, Arterial: 7.294 — ABNORMAL LOW (ref 7.35–7.45)
pH, Arterial: 7.297 — ABNORMAL LOW (ref 7.35–7.45)
pH, Arterial: 7.316 — ABNORMAL LOW (ref 7.35–7.45)
pH, Arterial: 7.301 — ABNORMAL LOW (ref 7.35–7.45)
pH, Arterial: 7.355 (ref 7.35–7.45)
pO2, Arterial: 34 mmHg — CL (ref 83–108)
pO2, Arterial: 41 mmHg — ABNORMAL LOW (ref 83–108)
pO2, Arterial: 43 mmHg — ABNORMAL LOW (ref 83–108)
pO2, Arterial: 446 mmHg — ABNORMAL HIGH (ref 83–108)
pO2, Arterial: 486 mmHg — ABNORMAL HIGH (ref 83–108)
pO2, Arterial: 51 mmHg — ABNORMAL LOW (ref 83–108)
pO2, Arterial: 518 mmHg — ABNORMAL HIGH (ref 83–108)
pO2, Arterial: 76 mmHg — ABNORMAL LOW (ref 83–108)
pO2, Arterial: 72 mmHg — ABNORMAL LOW (ref 83–108)
pO2, Arterial: 180 mmHg — ABNORMAL HIGH (ref 83–108)

## 2023-10-09 LAB — COMPREHENSIVE METABOLIC PANEL
ALT: 30 U/L (ref 0–44)
AST: 84 U/L — ABNORMAL HIGH (ref 15–41)
Albumin: 2.8 g/dL — ABNORMAL LOW (ref 3.5–5.0)
Alkaline Phosphatase: 36 U/L — ABNORMAL LOW (ref 38–126)
Anion gap: 13 (ref 5–15)
BUN: 8 mg/dL (ref 8–23)
CO2: 22 mmol/L (ref 22–32)
Calcium: 7 mg/dL — ABNORMAL LOW (ref 8.9–10.3)
Chloride: 107 mmol/L (ref 98–111)
Creatinine, Ser: 0.91 mg/dL (ref 0.61–1.24)
GFR, Estimated: >60 mL/min (ref >60)
Glucose, Bld: 147 mg/dL — ABNORMAL HIGH (ref 70–99)
Potassium: 4.2 mmol/L (ref 3.5–5.1)
Sodium: 142 mmol/L (ref 135–145)
Total Bilirubin: 1.4 mg/dL — ABNORMAL HIGH (ref <1.2)
Total Protein: 4.6 g/dL — ABNORMAL LOW (ref 6.5–8.1)

## 2023-10-09 LAB — CBC
HCT: 34.5 % — ABNORMAL LOW (ref 39.0–52.0)
HCT: 27.3 % — ABNORMAL LOW (ref 39.0–52.0)
HCT: 26.1 % — ABNORMAL LOW (ref 39.0–52.0)
Hemoglobin: 11.7 g/dL — ABNORMAL LOW (ref 13.0–17.0)
Hemoglobin: 9.6 g/dL — ABNORMAL LOW (ref 13.0–17.0)
Hemoglobin: 9.2 g/dL — ABNORMAL LOW (ref 13.0–17.0)
MCH: 29.4 pg (ref 26.0–34.0)
MCH: 29.7 pg (ref 26.0–34.0)
MCH: 29.1 pg (ref 26.0–34.0)
MCHC: 33.9 g/dL (ref 30.0–36.0)
MCHC: 35.2 g/dL (ref 30.0–36.0)
MCHC: 35.2 g/dL (ref 30.0–36.0)
MCV: 86.7 fL (ref 80.0–100.0)
MCV: 84.5 fL (ref 80.0–100.0)
MCV: 82.6 fL (ref 80.0–100.0)
Platelets: 59 10*3/uL — ABNORMAL LOW (ref 150–400)
Platelets: 49 10*3/uL — ABNORMAL LOW (ref 150–400)
Platelets: 47 10*3/uL — ABNORMAL LOW (ref 150–400)
RBC: 3.98 MIL/uL — ABNORMAL LOW (ref 4.22–5.81)
RBC: 3.23 MIL/uL — ABNORMAL LOW (ref 4.22–5.81)
RBC: 3.16 MIL/uL — ABNORMAL LOW (ref 4.22–5.81)
RDW: 15.3 % (ref 11.5–15.5)
RDW: 15.9 % — ABNORMAL HIGH (ref 11.5–15.5)
RDW: 16.2 % — ABNORMAL HIGH (ref 11.5–15.5)
WBC: 4 10*3/uL (ref 4.0–10.5)
WBC: 3.3 10*3/uL — ABNORMAL LOW (ref 4.0–10.5)
WBC: 3.5 10*3/uL — ABNORMAL LOW (ref 4.0–10.5)
nRBC: 0 % (ref 0.0–0.2)
nRBC: 0 % (ref 0.0–0.2)
nRBC: 0 % (ref 0.0–0.2)

## 2023-10-09 LAB — URINALYSIS, ROUTINE W REFLEX MICROSCOPIC
Bilirubin Urine: NEGATIVE
Glucose, UA: 50 mg/dL — AB
Ketones, ur: NEGATIVE mg/dL
Leukocytes,Ua: NEGATIVE
Nitrite: NEGATIVE
Protein, ur: NEGATIVE mg/dL
RBC / HPF: >50 RBC/hpf (ref 0–5)
Specific Gravity, Urine: 1.024 (ref 1.005–1.030)
pH: 5 (ref 5.0–8.0)

## 2023-10-09 LAB — DIC (DISSEMINATED INTRAVASCULAR COAGULATION)PANEL
D-Dimer, Quant: >20 ug{FEU}/mL — ABNORMAL HIGH (ref 0.00–0.50)
Fibrinogen: 158 mg/dL — ABNORMAL LOW (ref 210–475)
INR: 1.5 — ABNORMAL HIGH (ref 0.8–1.2)
Platelets: 56 10*3/uL — ABNORMAL LOW (ref 150–400)
Prothrombin Time: 18.6 s — ABNORMAL HIGH (ref 11.4–15.2)
Smear Review: NONE SEEN
aPTT: 46 s — ABNORMAL HIGH (ref 24–36)

## 2023-10-09 LAB — MRSA NEXT GEN BY PCR, NASAL: MRSA by PCR Next Gen: NOT DETECTED

## 2023-10-09 SURGERY — INSERTION, ENDOVASCULAR STENT GRAFT, AORTA, THORACIC
Anesthesia: General | Site: Thoracic

## 2023-10-09 MED ORDER — HYDROCORTISONE SOD SUC (PF) 100 MG IJ SOLR
INTRAMUSCULAR | Status: DC | PRN
  Administered 2023-10-09: 125 mg via INTRAVENOUS

## 2023-10-09 MED ORDER — HEPARIN 6000 UNIT IRRIGATION SOLUTION
Status: AC
  Filled 2023-10-09: qty 500

## 2023-10-09 MED ORDER — 0.9 % SODIUM CHLORIDE (POUR BTL) OPTIME
TOPICAL | Status: DC | PRN
  Administered 2023-10-08: 1000 mL

## 2023-10-09 MED ORDER — NOREPINEPHRINE 4 MG/250ML-% IV SOLN
0.0000 ug/min | INTRAVENOUS | Status: DC
  Administered 2023-10-09: 2 ug/min via INTRAVENOUS
  Administered 2023-10-10: 7 ug/min via INTRAVENOUS
  Administered 2023-10-10: 2 ug/min via INTRAVENOUS
  Administered 2023-10-10: 5 ug/min via INTRAVENOUS
  Administered 2023-10-11: 2 ug/min via INTRAVENOUS
  Administered 2023-10-12: 8 ug/min via INTRAVENOUS
  Administered 2023-10-12: 6 ug/min via INTRAVENOUS
  Administered 2023-10-13: 2 ug/min via INTRAVENOUS
  Filled 2023-10-09 (×7): qty 250

## 2023-10-09 MED ORDER — OXIDIZED CELLULOSE EX PADS
1.0000 | MEDICATED_PAD | Freq: Once | CUTANEOUS | Status: AC
  Administered 2023-10-09: 1 via TOPICAL
  Filled 2023-10-09: qty 1

## 2023-10-09 MED ORDER — HEPARIN 6000 UNIT IRRIGATION SOLUTION
Status: DC | PRN
  Administered 2023-10-09: 1

## 2023-10-09 MED ORDER — KETAMINE HCL 10 MG/ML IJ SOLN
INTRAMUSCULAR | Status: DC | PRN
  Administered 2023-10-09: 10 mg via INTRAVENOUS
  Administered 2023-10-09 (×2): 20 mg via INTRAVENOUS

## 2023-10-09 MED ORDER — PROPOFOL 500 MG/50ML IV EMUL
INTRAVENOUS | Status: DC | PRN
  Administered 2023-10-09: 50 ug/kg/min via INTRAVENOUS

## 2023-10-09 MED ORDER — IOHEXOL 300 MG/ML  SOLN
150.0000 mL | Freq: Once | INTRAMUSCULAR | Status: AC | PRN
  Administered 2023-10-09: 20 mL via INTRA_ARTERIAL

## 2023-10-09 MED ORDER — PROTAMINE SULFATE 10 MG/ML IV SOLN
INTRAVENOUS | Status: DC | PRN
  Administered 2023-10-09: 50 mg via INTRAVENOUS

## 2023-10-09 MED ORDER — HEPARIN SODIUM (PORCINE) 1000 UNIT/ML IJ SOLN
INTRAMUSCULAR | Status: DC | PRN
  Administered 2023-10-09: 5000 [IU] via INTRAVENOUS

## 2023-10-09 MED ORDER — CHLORHEXIDINE GLUCONATE CLOTH 2 % EX PADS
6.0000 | MEDICATED_PAD | Freq: Every day | CUTANEOUS | Status: DC
  Administered 2023-10-09 – 2023-10-12 (×5): 6 via TOPICAL

## 2023-10-09 MED ORDER — IODIXANOL 320 MG/ML IV SOLN
INTRAVENOUS | Status: DC | PRN
  Administered 2023-10-09: 75 mL

## 2023-10-09 MED ORDER — PROPOFOL 1000 MG/100ML IV EMUL
5.0000 ug/kg/min | INTRAVENOUS | Status: DC
  Administered 2023-10-09: 60 ug/kg/min via INTRAVENOUS
  Administered 2023-10-09 (×2): 70 ug/kg/min via INTRAVENOUS
  Administered 2023-10-09: 60 ug/kg/min via INTRAVENOUS
  Administered 2023-10-09: 40 ug/kg/min via INTRAVENOUS
  Administered 2023-10-10: 70 ug/kg/min via INTRAVENOUS
  Administered 2023-10-10: 60 ug/kg/min via INTRAVENOUS
  Administered 2023-10-10 (×2): 70 ug/kg/min via INTRAVENOUS
  Filled 2023-10-09 (×9): qty 100

## 2023-10-09 MED ORDER — 0.9 % SODIUM CHLORIDE (POUR BTL) OPTIME
TOPICAL | Status: DC | PRN
  Administered 2023-10-09: 1000 mL

## 2023-10-09 MED ORDER — SODIUM CHLORIDE 0.9 % IV SOLN: INTRAVENOUS | Status: AC

## 2023-10-09 MED ORDER — ROCURONIUM BROMIDE 10 MG/ML (PF) SYRINGE
PREFILLED_SYRINGE | INTRAVENOUS | Status: AC
  Filled 2023-10-09: qty 30

## 2023-10-09 MED ORDER — CALCIUM GLUCONATE-NACL 1-0.675 GM/50ML-% IV SOLN
1.0000 g | Freq: Once | INTRAVENOUS | Status: AC
  Administered 2023-10-09: 1000 mg via INTRAVENOUS
  Filled 2023-10-09: qty 50

## 2023-10-09 MED ORDER — ORAL CARE MOUTH RINSE
15.0000 mL | OROMUCOSAL | Status: DC
  Administered 2023-10-09 – 2023-10-21 (×145): 15 mL via OROMUCOSAL

## 2023-10-09 MED ORDER — ALBUMIN HUMAN 5 % IV SOLN
25.0000 g | Freq: Once | INTRAVENOUS | Status: AC
  Administered 2023-10-09: 25 g via INTRAVENOUS
  Filled 2023-10-09: qty 500

## 2023-10-09 MED ORDER — ORAL CARE MOUTH RINSE
15.0000 mL | OROMUCOSAL | Status: DC | PRN
Start: 2023-10-09 — End: 2023-10-28
  Administered 2023-10-26 (×2): 15 mL via OROMUCOSAL

## 2023-10-09 MED ORDER — MIDAZOLAM HCL 2 MG/2ML IJ SOLN
INTRAMUSCULAR | Status: AC
Start: 2023-10-09 — End: ?
  Filled 2023-10-09: qty 2

## 2023-10-09 SURGICAL SUPPLY — 57 items
APPLIER CLIP 9.375 MED OPEN (MISCELLANEOUS) ×2
APPLIER CLIP 9.375 SM OPEN (CLIP) ×2
BAG COUNTER SPONGE SURGICOUNT (BAG) ×3 IMPLANT
CANISTER SUCT 3000ML PPV (MISCELLANEOUS) ×3 IMPLANT
CATH ACCU-VU SIZ PIG 5F 100CM (CATHETERS) IMPLANT
CATH BEACON 5 .035 65 C2 TIP (CATHETERS) IMPLANT
CATH OMNI FLUSH 5F 65CM (CATHETERS) IMPLANT
CLIP APPLIE 9.375 MED OPEN (MISCELLANEOUS) ×3 IMPLANT
CLIP APPLIE 9.375 SM OPEN (CLIP) ×3 IMPLANT
CLOSURE MYNX CONTROL 5F (Vascular Products) IMPLANT
COVER PROBE FLEXI-FEEL 6X48 (MISCELLANEOUS) IMPLANT
DERMABOND ADVANCED .7 DNX12 (GAUZE/BANDAGES/DRESSINGS) ×3 IMPLANT
DEVICE CLOSURE PERCLS PRGLD 6F (VASCULAR PRODUCTS) ×6 IMPLANT
DRSG TEGADERM 2-3/8X2-3/4 SM (GAUZE/BANDAGES/DRESSINGS) ×3 IMPLANT
ELECT REM PT RETURN 9FT ADLT (ELECTROSURGICAL) ×4
ELECTRODE REM PT RTRN 9FT ADLT (ELECTROSURGICAL) ×6 IMPLANT
GAUZE SPONGE 4X4 12PLY STRL (GAUZE/BANDAGES/DRESSINGS) IMPLANT
GLIDEWIRE STIFF .35X180X3 HYDR (WIRE) IMPLANT
GLOVE BIOGEL PI IND STRL 8 (GLOVE) ×3 IMPLANT
GLOVE ORTHO TXT STRL SZ7.5 (GLOVE) IMPLANT
GLOVE SURG SS PI 7.0 STRL IVOR (GLOVE) IMPLANT
GLOVE SURG SS PI 8.0 STRL IVOR (GLOVE) IMPLANT
GOWN STRL NON-REIN LRG LVL3 (GOWN DISPOSABLE) ×3 IMPLANT
GOWN STRL REIN XL LVL4 (GOWNS) IMPLANT
GOWN STRL REUS W/ TWL LRG LVL3 (GOWN DISPOSABLE) ×3 IMPLANT
GOWN STRL REUS W/TWL 2XL LVL3 (GOWN DISPOSABLE) ×3 IMPLANT
GOWN STRL REUS W/TWL LRG LVL3 (GOWN DISPOSABLE) ×2
GRAFT BALLN CATH 65CM (BALLOONS) IMPLANT
KIT BASIN OR (CUSTOM PROCEDURE TRAY) ×3 IMPLANT
KIT TURNOVER KIT B (KITS) ×3 IMPLANT
NS IRRIG 1000ML POUR BTL (IV SOLUTION) ×6 IMPLANT
PACK ENDOVASCULAR (PACKS) ×3 IMPLANT
PAD ARMBOARD 7.5X6 YLW CONV (MISCELLANEOUS) ×6 IMPLANT
PERCLOSE PROGLIDE 6F (VASCULAR PRODUCTS) ×4
PROTECTION STATION PRESSURIZED (MISCELLANEOUS) ×2
SET MICROPUNCTURE 5F STIFF (MISCELLANEOUS) ×3 IMPLANT
SHEATH AVANTI 11CM 8FR (SHEATH) ×3 IMPLANT
SHEATH DRYSEAL FLEX 20FR 33CM (SHEATH) IMPLANT
STATION PROTECTION PRESSURIZED (MISCELLANEOUS) IMPLANT
STENT GRFT THORAC ACS 28X28X10 (Endovascular Graft) IMPLANT
STOPCOCK 3WAY HIGH PRESSURE (MISCELLANEOUS) IMPLANT
STOPCOCK 4 WAY LG BORE MALE ST (IV SETS) IMPLANT
STOPCOCK MORSE 400PSI 3WAY (MISCELLANEOUS) ×3 IMPLANT
SUT MNCRL AB 4-0 PS2 18 (SUTURE) ×3 IMPLANT
SUT PROLENE 5 0 C 1 24 (SUTURE) IMPLANT
SUT VIC AB 2-0 CTX 36 (SUTURE) IMPLANT
SUT VIC AB 3-0 SH 27 (SUTURE)
SUT VIC AB 3-0 SH 27X BRD (SUTURE) IMPLANT
SYR 30ML LL (SYRINGE) IMPLANT
SYR 3ML LL SCALE MARK (SYRINGE) IMPLANT
SYR 50ML LL SCALE MARK (SYRINGE) ×3 IMPLANT
TOWEL GREEN STERILE (TOWEL DISPOSABLE) ×3 IMPLANT
TRAY FOLEY MTR SLVR 16FR STAT (SET/KITS/TRAYS/PACK) ×3 IMPLANT
TUBING HIGH PRESSURE 120CM (CONNECTOR) ×3 IMPLANT
WIRE BENTSON .035X145CM (WIRE) ×3 IMPLANT
WIRE J 3MM .035X145CM (WIRE) IMPLANT
WIRE STIFF LUNDERQUIST 260CM (WIRE) ×3 IMPLANT
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
## 2023-10-09 NOTE — Progress Notes (Signed)
Orthopedic Tech Progress Note Patient Details:  [name] [name] 02/22/1961 [id number]  Musculoskeletal Traction Type of Traction: Skeletal (Balanced Suspension) Traction Location: RLE Traction Weight: 30 lbs   Post Interventions Patient Tolerated: Well  [name] [name] 10/09/2023, 11:02 AM

## 2023-10-09 NOTE — Progress Notes (Signed)
Patient ID: [name] [name], male   DOB: 04-14-1961, 62 y.o.   MRN: [id number] Follow up - Trauma Critical Care   Patient Details:    [name] [name] is an 62 y.o. male.  Lines/tubes : Airway 8 mm (Active)  Secured at (cm) 24 cm 10/08/23 0002     CVC Triple Lumen 10/08/23 Right Internal jugular (Active)  Indication for Insertion or Continuance of Line Vasoactive infusions 10/09/23 0300  Site Assessment Other (Comment) 10/09/23 0300  Proximal Lumen Status Infusing 10/09/23 0300  Medial Lumen Status Other (Comment) 10/09/23 0300  Distal Lumen Status Saline locked 10/09/23 0300  Dressing Type Transparent 10/09/23 0300  Dressing Status Antimicrobial disc in place 10/09/23 0300  Dressing Intervention Other (Comment) 10/09/23 0300  Dressing Change Due 10/15/23 10/09/23 0300     Arterial Line 10/08/23 Left Radial (Active)  Site Assessment Clean, Dry, Intact 10/09/23 0300  Art Line Waveform Appropriate 10/09/23 0300  Art Line Interventions Zeroed and calibrated;Leveled;Connections checked and tightened;Flushed per protocol 10/09/23 0300  Color/Movement/Sensation Capillary refill less than 3 sec 10/09/23 0300  Dressing Type Transparent;Securing device 10/09/23 0300  Dressing Status Antimicrobial disc in place;Old drainage;Dry;Intact 10/09/23 0300  Dressing Change Due 10/15/23 10/09/23 0300     Urethral Catheter Lane Temperature probe 16 Fr. (Active)  Indication for Insertion or Continuance of Catheter Unstable critically ill patients first 24-48 hours (See Criteria);Unstable spinal/crush injuries / Multisystem Trauma 10/09/23 0342  Site Assessment Clean, Dry, Intact 10/09/23 0342  Catheter Maintenance Bag below level of bladder;Catheter secured;Drainage bag/tubing not touching floor;No dependent loops 10/09/23 0342  Collection Container Standard drainage bag 10/09/23 0342  Securement Method Adhesive securement device 10/09/23 0342  Output (mL) 625 mL 10/09/23 0601    Microbiology/Sepsis  markers: Results for orders placed or performed during the hospital encounter of 10/08/23  MRSA Next Gen by PCR, Nasal     Status: None   Collection Time: 10/09/23  3:07 AM   Specimen: Nasal Mucosa; Nasal Swab  Result Value Ref Range Status   MRSA by PCR Next Gen NOT DETECTED NOT DETECTED Final    Comment: (NOTE) The GeneXpert MRSA Assay (FDA approved for NASAL specimens only), is one component of a comprehensive MRSA colonization surveillance program. It is not intended to diagnose MRSA infection nor to guide or monitor treatment for MRSA infections. Test performance is not FDA approved in patients less than 62 years old. Performed at [facility], [address], [place], [place] [address]     Anti-infectives:  Anti-infectives (From admission, onward)    None        Consults: Treatment Team:  Despina Arias, MD Haddix, Gillie Manners, MD    Studies:    Events:  Subjective:    Overnight Issues:   Objective:  Vital signs for last 24 hours: Temp:  [93.4 F (34.1 C)-95.4 F (35.2 C)] 95.4 F (35.2 C) (11/12 0645) Pulse Rate:  [70-109] 73 (11/12 0645) Resp:  [16-28] 28 (11/12 0600) BP: (84-145)/(54-102) 129/93 (11/12 0645) SpO2:  [88 %-100 %] 96 % (11/12 0645) Arterial Line BP: (106-165)/(68-88) 134/80 (11/12 0645) FiO2 (%):  [100 %] 100 % (11/12 0302) Weight:  [74.5 kg] 74.5 kg (11/12 0302)  Hemodynamic parameters for last 24 hours:    Intake/Output from previous day: 11/11 0701 - 11/12 0700 In: 06301 [I.V.:12910.9; SWFUX:3235; IV Piggyback:1272.1] Out: 2550 [Urine:1650; Blood:900]  Intake/Output this shift: No intake/output data recorded.  Vent settings for last 24 hours: Vent Mode: PRVC FiO2 (%):  [100 %] 100 %  Set Rate:  [20 bmp-28 bmp] 28 bmp Vt Set:  [400 mL-500 mL] 450 mL PEEP:  [5 cmH20-12 cmH20] 12 cmH20 Plateau Pressure:  [14 cmH20-35 cmH20] 35 cmH20  Physical Exam:  General: on vent Neuro: sedated HEENT/Neck: collar - will  remove Resp: clear to auscultation bilaterally CVS: RRR GI: vicryl mesh silo, dressing changed Extremities: traction pin R femur with no traction  Results for orders placed or performed during the hospital encounter of 10/08/23 (from the past 24 hour(s))  Prepare fresh frozen plasma     Status: None (Preliminary result)   Collection Time: 10/08/23  9:10 PM  Result Value Ref Range   Unit Number [id number]    Blood Component Type LIQ PLASMA    Unit division 00    Status of Unit ISSUED    Unit tag comment EMERGENCY RELEASE    Transfusion Status OK TO TRANSFUSE    Unit Number [id number]    Blood Component Type THW PLS APHR    Unit division B0    Status of Unit ISSUED    Unit tag comment EMERGENCY RELEASE    Transfusion Status OK TO TRANSFUSE    Unit Number [id number]    Blood Component Type LIQ PLASMA    Unit division 00    Status of Unit ISSUED    Unit tag comment EMERGENCY RELEASE    Transfusion Status OK TO TRANSFUSE    Unit Number [id number]    Blood Component Type THAWED PLASMA    Unit division 00    Status of Unit ISSUED    Unit tag comment EMERGENCY RELEASE    Transfusion Status OK TO TRANSFUSE    Unit Number [id number]    Blood Component Type LIQ PLASMA    Unit division 00    Status of Unit ISSUED    Transfusion Status OK TO TRANSFUSE    Unit Number [id number]    Blood Component Type LIQ PLASMA    Unit division 00    Status of Unit ISSUED    Transfusion Status OK TO TRANSFUSE    Unit Number [id number]    Blood Component Type LIQ PLASMA    Unit division 00    Status of Unit ISSUED    Transfusion Status OK TO TRANSFUSE    Unit Number [id number]    Blood Component Type LIQ PLASMA    Unit division 00    Status of Unit ISSUED    Transfusion Status OK TO TRANSFUSE    Unit Number [id number]    Blood Component Type LIQ PLASMA    Unit division 00    Status of Unit ISSUED    Transfusion Status OK TO TRANSFUSE    Unit Number [id number]     Blood Component Type LIQ PLASMA    Unit division 00    Status of Unit ISSUED    Transfusion Status OK TO TRANSFUSE    Unit Number [id number]    Blood Component Type THAWED PLASMA    Unit division 00    Status of Unit ISSUED    Unit tag comment EMERGENCY RELEASE    Transfusion Status OK TO TRANSFUSE    Unit Number [id number]    Blood Component Type THAWED PLASMA    Unit division 00    Status of Unit ISSUED    Unit tag comment EMERGENCY RELEASE    Transfusion Status OK TO TRANSFUSE    Unit Number [id number]    Blood Component Type THW PLS APHR    Unit division  B0    Status of Unit ISSUED    Unit tag comment EMERGENCY RELEASE    Transfusion Status OK TO TRANSFUSE    Unit Number [id number]    Blood Component Type THW PLS APHR    Unit division B0    Status of Unit ISSUED    Unit tag comment EMERGENCY RELEASE    Transfusion Status OK TO TRANSFUSE    Unit Number [id number]    Blood Component Type LIQ PLASMA    Unit division 00    Status of Unit ISSUED    Transfusion Status OK TO TRANSFUSE    Unit Number [id number]    Blood Component Type LIQ PLASMA    Unit division 00    Status of Unit ISSUED    Transfusion Status OK TO TRANSFUSE    Unit Number [id number]    Blood Component Type THAWED PLASMA    Unit division 00    Status of Unit ISSUED    Transfusion Status OK TO TRANSFUSE    Unit Number [id number]    Blood Component Type THW PLS APHR    Unit division 00    Status of Unit ISSUED    Transfusion Status OK TO TRANSFUSE    Unit Number [id number]    Blood Component Type THW PLS APHR    Unit division A0    Status of Unit REL FROM [facility]    Transfusion Status OK TO TRANSFUSE    Unit Number [id number]    Blood Component Type THAWED PLASMA    Unit division 00    Status of Unit ISSUED    Transfusion Status OK TO TRANSFUSE    Unit Number [id number]    Blood Component Type THW PLS APHR    Unit division B0    Status of Unit REL FROM [facility]     Transfusion Status OK TO TRANSFUSE    Unit Number [id number]    Blood Component Type THW PLS APHR    Unit division B0    Status of Unit REL FROM [facility]    Transfusion Status OK TO TRANSFUSE    Unit Number [id number]    Blood Component Type THW PLS APHR    Unit division B0    Status of Unit REL FROM [facility]    Transfusion Status OK TO TRANSFUSE    Unit Number [id number]    Blood Component Type THW PLS APHR    Unit division B0    Status of Unit REL FROM [facility]    Transfusion Status OK TO TRANSFUSE    Unit Number [id number]    Blood Component Type THW PLS APHR    Unit division B0    Status of Unit REL FROM [facility]    Transfusion Status OK TO TRANSFUSE    Unit Number [id number]    Blood Component Type THW PLS APHR    Unit division 00    Status of Unit REL FROM [facility]    Transfusion Status OK TO TRANSFUSE    Unit Number [id number]    Blood Component Type THW PLS APHR    Unit division 00    Status of Unit REL FROM [facility]    Transfusion Status OK TO TRANSFUSE    Unit Number [id number]    Blood Component Type THW PLS APHR    Unit division 00    Status of Unit ISSUED    Transfusion Status OK TO TRANSFUSE    Unit Number [id number]    Blood Component Type THW PLS APHR  Unit division A0    Status of Unit REL FROM [facility]    Transfusion Status      OK TO TRANSFUSE Performed at [facility], [address], [place], [place] [address]    Unit Number [id number]    Blood Component Type THW PLS APHR    Unit division 00    Status of Unit REL FROM [facility]    Transfusion Status OK TO TRANSFUSE    Unit Number [id number]    Blood Component Type THW PLS APHR    Unit division 00    Status of Unit REL FROM [facility]    Transfusion Status OK TO TRANSFUSE    Unit Number [id number]    Blood Component Type THW PLS APHR    Unit division 00    Status of Unit REL FROM [facility]    Transfusion Status OK TO TRANSFUSE   Initiate MTP (Blood Bank Notification)     Status:  None   Collection Time: 10/08/23  9:15 PM  Result Value Ref Range   Initiate Massive Transfusion Protocol      MTP ACTIVATED Performed at [facility], [address] [place], [place] [address]   Prepare platelet pheresis     Status: None (Preliminary result)   Collection Time: 10/08/23  9:16 PM  Result Value Ref Range   Unit Number [id number]    Blood Component Type PLTP2 PSORALEN TREATED    Unit division 00    Status of Unit ISSUED    Unit tag comment EMERGENCY RELEASE    Transfusion Status OK TO TRANSFUSE    Unit Number [id number]    Blood Component Type PLTP1 PSORALEN TREATED    Unit division 00    Status of Unit ISSUED    Transfusion Status OK TO TRANSFUSE    Unit Number [id number]    Blood Component Type PLTP2 PSORALEN TREATED    Unit division 00    Status of Unit ISSUED    Transfusion Status OK TO TRANSFUSE   Prepare cryoprecipitate     Status: None (Preliminary result)   Collection Time: 10/08/23  9:21 PM  Result Value Ref Range   Unit Number [id number]    Blood Component Type POOL FIBR CMPLX 2D THW    Unit division 00    Status of Unit ISSUED    Transfusion Status OK TO TRANSFUSE    Unit Number [id number]    Blood Component Type POOL FIBR CMPLX 2D THW    Unit division 00    Status of Unit ISSUED    Transfusion Status OK TO TRANSFUSE   Type and screen Ordered by PROVIDER DEFAULT     Status: None (Preliminary result)   Collection Time: 10/08/23 10:51 PM  Result Value Ref Range   ABO/RH(D) A POS    Antibody Screen NEG    Sample Expiration 10/11/2023,2359    Unit Number [id number]    Blood Component Type RED CELLS,LR    Unit division 00    Status of Unit ISSUED    Unit tag comment EMERGENCY RELEASE    Transfusion Status OK TO TRANSFUSE    Crossmatch Result COMPATIBLE    Unit Number [id number]    Blood Component Type RED CELLS,LR    Unit division 00    Status of Unit ISSUED    Unit tag comment EMERGENCY RELEASE    Transfusion  Status OK TO TRANSFUSE    Crossmatch Result COMPATIBLE    Unit Number [id number]  Blood Component Type RED CELLS,LR    Unit division 00    Status of Unit ISSUED    Unit tag comment EMERGENCY RELEASE    Transfusion Status OK TO TRANSFUSE    Crossmatch Result COMPATIBLE    Unit Number [id number]    Blood Component Type RED CELLS,LR    Unit division 00    Status of Unit ISSUED    Unit tag comment EMERGENCY RELEASE    Transfusion Status OK TO TRANSFUSE    Crossmatch Result COMPATIBLE    Unit Number [id number]    Blood Component Type RED CELLS,LR    Unit division 00    Status of Unit ISSUED    Unit tag comment EMERGENCY RELEASE    Transfusion Status OK TO TRANSFUSE    Crossmatch Result COMPATIBLE    Unit Number [id number]    Blood Component Type RED CELLS,LR    Unit division 00    Status of Unit ISSUED    Unit tag comment EMERGENCY RELEASE    Transfusion Status OK TO TRANSFUSE    Crossmatch Result COMPATIBLE    Unit Number [id number]    Blood Component Type RED CELLS,LR    Unit division 00    Status of Unit ISSUED    Transfusion Status OK TO TRANSFUSE    Crossmatch Result NOT NEEDED    Unit Number [id number]    Blood Component Type RED CELLS,LR    Unit division 00    Status of Unit ISSUED    Transfusion Status OK TO TRANSFUSE    Crossmatch Result NOT NEEDED    Unit Number [id number]    Blood Component Type RED CELLS,LR    Unit division 00    Status of Unit ISSUED    Transfusion Status OK TO TRANSFUSE    Crossmatch Result NOT NEEDED    Unit Number [id number]    Blood Component Type RED CELLS,LR    Unit division 00    Status of Unit ISSUED    Transfusion Status OK TO TRANSFUSE    Crossmatch Result NOT NEEDED    Unit Number [id number]    Blood Component Type RED CELLS,LR    Unit division 00    Status of Unit ISSUED    Transfusion Status OK TO TRANSFUSE    Crossmatch Result NOT NEEDED    Unit Number [id number]    Blood Component Type  RED CELLS,LR    Unit division 00    Status of Unit ISSUED    Transfusion Status OK TO TRANSFUSE    Crossmatch Result NOT NEEDED    Unit Number [id number]    Blood Component Type RED CELLS,LR    Unit division 00    Status of Unit ISSUED    Transfusion Status OK TO TRANSFUSE    Crossmatch Result NOT NEEDED    Unit Number [id number]    Blood Component Type RED CELLS,LR    Unit division 00    Status of Unit ISSUED    Transfusion Status OK TO TRANSFUSE    Crossmatch Result NOT NEEDED    Unit Number [id number]    Blood Component Type RED CELLS,LR    Unit division 00    Status of Unit ISSUED    Unit tag comment VERBAL ORDERS PER DR STECHULTE    Transfusion Status OK TO TRANSFUSE    Crossmatch Result COMPATIBLE    Unit Number [id number]    Blood Component Type RED CELLS,LR    Unit division 00    Status  of Unit ISSUED    Unit tag comment VERBAL ORDERS PER DR STECHULTE    Transfusion Status OK TO TRANSFUSE    Crossmatch Result COMPATIBLE    Unit Number [id number]    Blood Component Type RED CELLS,LR    Unit division 00    Status of Unit ISSUED    Unit tag comment VERBAL ORDERS PER DR STECHULTE    Transfusion Status OK TO TRANSFUSE    Crossmatch Result COMPATIBLE    Unit Number [id number]    Blood Component Type RED CELLS,LR    Unit division 00    Status of Unit ISSUED    Unit tag comment VERBAL ORDERS PER DR STECHULTE    Transfusion Status OK TO TRANSFUSE    Crossmatch Result COMPATIBLE    Unit Number [id number]    Blood Component Type RED CELLS,LR    Unit division 00    Status of Unit REL FROM [facility]    Unit tag comment VERBAL ORDERS PER DR STECHULTE    Transfusion Status OK TO TRANSFUSE    Crossmatch Result NOT NEEDED    Unit Number [id number]    Blood Component Type RED CELLS,LR    Unit division 00    Status of Unit ISSUED    Unit tag comment VERBAL ORDERS PER DR STECHULTE    Transfusion Status OK TO TRANSFUSE    Crossmatch Result NOT NEEDED     Unit Number [id number]    Blood Component Type RED CELLS,LR    Unit division 00    Status of Unit ISSUED    Unit tag comment VERBAL ORDERS PER DR STECHULTE    Transfusion Status OK TO TRANSFUSE    Crossmatch Result NOT NEEDED    Unit Number [id number]    Blood Component Type RED CELLS,LR    Unit division 00    Status of Unit ISSUED    Unit tag comment VERBAL ORDERS PER DR STECHULTE    Transfusion Status OK TO TRANSFUSE    Crossmatch Result NOT NEEDED    Unit Number [id number]    Blood Component Type RED CELLS,LR    Unit division 00    Status of Unit REL FROM [facility]    Transfusion Status OK TO TRANSFUSE    Crossmatch Result Compatible    Unit Number [id number]    Blood Component Type RED CELLS,LR    Unit division 00    Status of Unit ISSUED    Transfusion Status OK TO TRANSFUSE    Crossmatch Result Compatible    Unit Number [id number]    Blood Component Type RED CELLS,LR    Unit division 00    Status of Unit ISSUED    Transfusion Status OK TO TRANSFUSE    Crossmatch Result Compatible    Unit Number [id number]    Blood Component Type RED CELLS,LR    Unit division 00    Status of Unit ISSUED    Transfusion Status OK TO TRANSFUSE    Crossmatch Result Compatible    Unit Number [id number]    Blood Component Type RED CELLS,LR    Unit division 00    Status of Unit ISSUED    Transfusion Status OK TO TRANSFUSE    Crossmatch Result Compatible    Unit Number [id number]    Blood Component Type RED CELLS,LR    Unit division 00    Status of Unit ISSUED    Transfusion Status OK TO TRANSFUSE    Crossmatch Result Compatible    Unit Number  [id number]    Blood Component Type RED CELLS,LR    Unit division 00    Status of Unit REL FROM [facility]    Transfusion Status OK TO TRANSFUSE    Crossmatch Result Compatible    Unit Number [id number]    Blood Component Type RED CELLS,LR    Unit division 00    Status of Unit REL FROM [facility]    Transfusion Status  OK TO TRANSFUSE    Crossmatch Result Compatible    Unit Number [id number]    Blood Component Type RBC LR PHER1    Unit division 00    Status of Unit REL FROM [facility]    Transfusion Status OK TO TRANSFUSE    Crossmatch Result Compatible    Unit Number [id number]    Blood Component Type RED CELLS,LR    Unit division 00    Status of Unit REL FROM [facility]    Transfusion Status OK TO TRANSFUSE    Crossmatch Result Compatible    Unit Number [id number]    Blood Component Type RED CELLS,LR    Unit division 00    Status of Unit REL FROM [facility]    Transfusion Status OK TO TRANSFUSE    Crossmatch Result Compatible    Unit Number [id number]    Blood Component Type RED CELLS,LR    Unit division 00    Status of Unit REL FROM [facility]    Transfusion Status OK TO TRANSFUSE    Crossmatch Result      Compatible Performed at [facility], [address], [place], [place] [address]   I-STAT 7, (LYTES, BLD GAS, ICA, H+H)     Status: Abnormal   Collection Time: 10/08/23 10:52 PM  Result Value Ref Range   pH, Arterial 7.214 (L) 7.35 - 7.45   pCO2 arterial 42.2 32 - 48 mmHg   pO2, Arterial 446 (H) 83 - 108 mmHg   Bicarbonate 17.6 (L) 20.0 - 28.0 mmol/L   TCO2 19 (L) 22 - 32 mmol/L   O2 Saturation 100 %   Acid-base deficit 10.0 (H) 0.0 - 2.0 mmol/L   Sodium 137 135 - 145 mmol/L   Potassium 4.6 3.5 - 5.1 mmol/L   Calcium, Ion 0.87 (LL) 1.15 - 1.40 mmol/L   HCT 29.0 (L) 39.0 - 52.0 %   Hemoglobin 9.9 (L) 13.0 - 17.0 g/dL   Patient temperature 93.8 C    Sample type ARTERIAL    Comment NOTIFIED PHYSICIAN   ABO/Rh     Status: None   Collection Time: 10/08/23 10:53 PM  Result Value Ref Range   ABO/RH(D)      A POS Performed at [facility], [address] [place], [place] [address]   I-STAT 7, (LYTES, BLD GAS, ICA, H+H)     Status: Abnormal   Collection Time: 10/08/23 11:12 PM  Result Value Ref Range   pH, Arterial 7.282 (L) 7.35 - 7.45   pCO2 arterial 41.2 32 - 48 mmHg    pO2, Arterial 518 (H) 83 - 108 mmHg   Bicarbonate 20.1 20.0 - 28.0 mmol/L   TCO2 21 (L) 22 - 32 mmol/L   O2 Saturation 100 %   Acid-base deficit 7.0 (H) 0.0 - 2.0 mmol/L   Sodium 137 135 - 145 mmol/L   Potassium 5.0 3.5 - 5.1 mmol/L   Calcium, Ion 0.76 (LL) 1.15 - 1.40 mmol/L   HCT 26.0 (L) 39.0 - 52.0 %   Hemoglobin 8.8 (L) 13.0 - 17.0  g/dL   Patient temperature 16.1 C    Sample type ARTERIAL    Comment NOTIFIED PHYSICIAN   I-STAT 7, (LYTES, BLD GAS, ICA, H+H)     Status: Abnormal   Collection Time: 10/08/23 11:44 PM  Result Value Ref Range   pH, Arterial 7.297 (L) 7.35 - 7.45   pCO2 arterial 38.0 32 - 48 mmHg   pO2, Arterial 486 (H) 83 - 108 mmHg   Bicarbonate 19.1 (L) 20.0 - 28.0 mmol/L   TCO2 20 (L) 22 - 32 mmol/L   O2 Saturation 100 %   Acid-base deficit 7.0 (H) 0.0 - 2.0 mmol/L   Sodium 138 135 - 145 mmol/L   Potassium 5.1 3.5 - 5.1 mmol/L   Calcium, Ion 0.60 (LL) 1.15 - 1.40 mmol/L   HCT 26.0 (L) 39.0 - 52.0 %   Hemoglobin 8.8 (L) 13.0 - 17.0 g/dL   Patient temperature 09.6 C    Sample type ARTERIAL    Comment NOTIFIED PHYSICIAN   I-STAT 7, (LYTES, BLD GAS, ICA, H+H)     Status: Abnormal   Collection Time: 10/09/23 12:26 AM  Result Value Ref Range   pH, Arterial 7.290 (L) 7.35 - 7.45   pCO2 arterial 35.9 32 - 48 mmHg   pO2, Arterial 76 (L) 83 - 108 mmHg   Bicarbonate 18.1 (L) 20.0 - 28.0 mmol/L   TCO2 19 (L) 22 - 32 mmol/L   O2 Saturation 96 %   Acid-base deficit 9.0 (H) 0.0 - 2.0 mmol/L   Sodium 139 135 - 145 mmol/L   Potassium 5.1 3.5 - 5.1 mmol/L   Calcium, Ion 0.79 (LL) 1.15 - 1.40 mmol/L   HCT 26.0 (L) 39.0 - 52.0 %   Hemoglobin 8.8 (L) 13.0 - 17.0 g/dL   Patient temperature 04.5 C    Sample type ARTERIAL   I-STAT 7, (LYTES, BLD GAS, ICA, H+H)     Status: Abnormal   Collection Time: 10/09/23  1:07 AM  Result Value Ref Range   pH, Arterial 7.294 (L) 7.35 - 7.45   pCO2 arterial 34.8 32 - 48 mmHg   pO2, Arterial 51 (L) 83 - 108 mmHg   Bicarbonate 17.6  (L) 20.0 - 28.0 mmol/L   TCO2 19 (L) 22 - 32 mmol/L   O2 Saturation 89 %   Acid-base deficit 9.0 (H) 0.0 - 2.0 mmol/L   Sodium 140 135 - 145 mmol/L   Potassium 5.1 3.5 - 5.1 mmol/L   Calcium, Ion 0.72 (LL) 1.15 - 1.40 mmol/L   HCT 29.0 (L) 39.0 - 52.0 %   Hemoglobin 9.9 (L) 13.0 - 17.0 g/dL   Patient temperature 40.9 C    Sample type ARTERIAL   I-STAT 7, (LYTES, BLD GAS, ICA, H+H)     Status: Abnormal   Collection Time: 10/09/23  1:38 AM  Result Value Ref Range   pH, Arterial 7.316 (L) 7.35 - 7.45   pCO2 arterial 33.3 32 - 48 mmHg   pO2, Arterial 43 (L) 83 - 108 mmHg   Bicarbonate 17.7 (L) 20.0 - 28.0 mmol/L   TCO2 19 (L) 22 - 32 mmol/L   O2 Saturation 84 %   Acid-base deficit 9.0 (H) 0.0 - 2.0 mmol/L   Sodium 141 135 - 145 mmol/L   Potassium 4.7 3.5 - 5.1 mmol/L   Calcium, Ion 0.74 (LL) 1.15 - 1.40 mmol/L   HCT 26.0 (L) 39.0 - 52.0 %   Hemoglobin 8.8 (L) 13.0 - 17.0 g/dL   Patient temperature  33.5 C    Sample type ARTERIAL   I-STAT 7, (LYTES, BLD GAS, ICA, H+H)     Status: Abnormal   Collection Time: 10/09/23  2:15 AM  Result Value Ref Range   pH, Arterial 7.152 (LL) 7.35 - 7.45   pCO2 arterial 52.1 (H) 32 - 48 mmHg   pO2, Arterial 34 (LL) 83 - 108 mmHg   Bicarbonate 19.1 (L) 20.0 - 28.0 mmol/L   TCO2 21 (L) 22 - 32 mmol/L   O2 Saturation 61 %   Acid-base deficit 10.0 (H) 0.0 - 2.0 mmol/L   Sodium 141 135 - 145 mmol/L   Potassium 5.0 3.5 - 5.1 mmol/L   Calcium, Ion 0.81 (LL) 1.15 - 1.40 mmol/L   HCT 30.0 (L) 39.0 - 52.0 %   Hemoglobin 10.2 (L) 13.0 - 17.0 g/dL   Patient temperature 52.8 C    Sample type ARTERIAL   MRSA Next Gen by PCR, Nasal     Status: None   Collection Time: 10/09/23  3:07 AM   Specimen: Nasal Mucosa; Nasal Swab  Result Value Ref Range   MRSA by PCR Next Gen NOT DETECTED NOT DETECTED  Comprehensive metabolic panel     Status: Abnormal   Collection Time: 10/09/23  3:12 AM  Result Value Ref Range   Sodium 142 135 - 145 mmol/L   Potassium 4.2 3.5  - 5.1 mmol/L   Chloride 107 98 - 111 mmol/L   CO2 22 22 - 32 mmol/L   Glucose, Bld 147 (H) 70 - 99 mg/dL   BUN 8 8 - 23 mg/dL   Creatinine, Ser 4.13 0.61 - 1.24 mg/dL   Calcium 7.0 (L) 8.9 - 10.3 mg/dL   Total Protein 4.6 (L) 6.5 - 8.1 g/dL   Albumin 2.8 (L) 3.5 - 5.0 g/dL   AST 84 (H) 15 - 41 U/L   ALT 30 0 - 44 U/L   Alkaline Phosphatase 36 (L) 38 - 126 U/L   Total Bilirubin 1.4 (H) <1.2 mg/dL   GFR, Estimated >24 >40 mL/min   Anion gap 13 5 - 15  CBC     Status: Abnormal   Collection Time: 10/09/23  3:12 AM  Result Value Ref Range   WBC 4.0 4.0 - 10.5 K/uL   RBC 3.98 (L) 4.22 - 5.81 MIL/uL   Hemoglobin 11.7 (L) 13.0 - 17.0 g/dL   HCT 10.2 (L) 72.5 - 36.6 %   MCV 86.7 80.0 - 100.0 fL   MCH 29.4 26.0 - 34.0 pg   MCHC 33.9 30.0 - 36.0 g/dL   RDW 44.0 34.7 - 42.5 %   Platelets 59 (L) 150 - 400 K/uL   nRBC 0.0 0.0 - 0.2 %  Urinalysis, Routine w reflex microscopic -Urine, Clean Catch     Status: Abnormal   Collection Time: 10/09/23  3:12 AM  Result Value Ref Range   Color, Urine YELLOW YELLOW   APPearance HAZY (A) CLEAR   Specific Gravity, Urine 1.024 1.005 - 1.030   pH 5.0 5.0 - 8.0   Glucose, UA 50 (A) NEGATIVE mg/dL   Hgb urine dipstick LARGE (A) NEGATIVE   Bilirubin Urine NEGATIVE NEGATIVE   Ketones, ur NEGATIVE NEGATIVE mg/dL   Protein, ur NEGATIVE NEGATIVE mg/dL   Nitrite NEGATIVE NEGATIVE   Leukocytes,Ua NEGATIVE NEGATIVE   RBC / HPF >50 0 - 5 RBC/hpf   WBC, UA 6-10 0 - 5 WBC/hpf   Bacteria, UA RARE (A) NONE SEEN   Squamous Epithelial /  HPF 0-5 0 - 5 /HPF  DIC Panel now then every 30 minutes     Status: Abnormal   Collection Time: 10/09/23  3:12 AM  Result Value Ref Range   Prothrombin Time 18.6 (H) 11.4 - 15.2 seconds   INR 1.5 (H) 0.8 - 1.2   aPTT 46 (H) 24 - 36 seconds   Fibrinogen 158 (L) 210 - 475 mg/dL   D-Dimer, Quant >16.10 (H) 0.00 - 0.50 ug/mL-FEU   Platelets 56 (L) 150 - 400 K/uL   Smear Review NO SCHISTOCYTES SEEN   I-STAT 7, (LYTES, BLD GAS,  ICA, H+H)     Status: Abnormal   Collection Time: 10/09/23  3:20 AM  Result Value Ref Range   pH, Arterial 7.176 (LL) 7.35 - 7.45   pCO2 arterial 55.1 (H) 32 - 48 mmHg   pO2, Arterial 41 (L) 83 - 108 mmHg   Bicarbonate 21.2 20.0 - 28.0 mmol/L   TCO2 23 22 - 32 mmol/L   O2 Saturation 72 %   Acid-base deficit 8.0 (H) 0.0 - 2.0 mmol/L   Sodium 142 135 - 145 mmol/L   Potassium 4.3 3.5 - 5.1 mmol/L   Calcium, Ion 1.02 (L) 1.15 - 1.40 mmol/L   HCT 32.0 (L) 39.0 - 52.0 %   Hemoglobin 10.9 (L) 13.0 - 17.0 g/dL   Patient temperature 96.0 C    Collection site art line    Drawn by RT    Sample type ARTERIAL    Comment NOTIFIED PHYSICIAN     Assessment & Plan: Present on Admission: **None**    LOS: 1 day   Additional comments:I reviewed the patient's new clinical lab test results. / PHBC  S/P ex lap, pelvic packing (5 laps), and temporary abdominal closure with vicryl mesh by Dr. Dossie Der 11/12 - possible return to OR tomorrow for removal of packs and Abthera placement Aortic transsection - Dr. Sherral Hammers did TEVAR 11/12 R acetabular FX, B superior and inferior rami FXs, R iliac FX, L sacral FX - Dr. Hulda Humphrey consulted, R femur traction pin - will clarify traction, anticipate Ortho Trauma will take over Pelvic hemorrhage - packed as above, S/P IR embolization by Dr. Elby Showers 11/12 Urology consulted (Dr. Roxy Horseman) - no bladder injury found, uncertain status of R ureter Acute hypoxic ventilator dependent respiratory failure - TRALI, 100% and PEEP 12, ABG now Right gluteal hematoma Right psoas hematoma Right 11th rib fracture Multiple lumbar vertebral body transverse process fractures Right scalp hematoma ABL anemia - CBC at 1200 FEN - volume resuscitate VTE - no LMWH yet as PLTs under 100K and Hb not stable Dispo - ICU, try to find family    Critical Care Total Time*: 45 Minutes  Violeta Gelinas, MD, MPH, FACS Trauma & General Surgery Use [email or web address] to contact on call  provider  10/09/2023  *Care during the described time interval was provided by me. I have reviewed this patient's available data, including medical history, events of note, physical examination and test results as part of my evaluation.

## 2023-10-09 NOTE — Progress Notes (Signed)
Trauma Event Note    ABD dressing changed d/t saturation, OKAY for RN change if needed per Dr. Janee Morn. Photo placed in chart. Bleeding from lower part of mess and surgical wound. Dr. Fredricka Bonine updated, continue to expect drainage.     Angelia Hazell O Tarryn Bogdan  Trauma Response RN  Please call TRN at [phone] for further assistance.

## 2023-10-09 NOTE — Procedures (Signed)
Interventional Radiology Procedure Note  Procedure:  1) Pelvic angiogram 2) Gelfoam embolization of the bilateral internal iliac arteries  Findings: Please refer to procedural dictation for full description. Extensive arterial irregularity about the bilateral internal iliac artery branches. Gelfoam slurry embolization bilaterally.  Left CFA access, 5 Fr Mynx closure.  Complications: None immediate  Estimated Blood Loss: < 5 ml  Recommendations: 4 hour bedrest - otherwise as per Trauma team. IR will follow.   Marliss Coots, MD Pager: [phone]
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
## 2023-10-09 NOTE — TOC CAGE-AID Note (Signed)
Transition of Care [facility]) - CAGE-AID Screening   Patient Details  Name: [name] [name] MRN: [id number] Date of Birth: 06/30/1961  Hewitt Shorts, RN Trauma Response Nurse Phone Number: [phone] 10/09/2023, 5:27 PM      CAGE-AID Screening: Substance Abuse Screening unable to be completed due to: : Patient unable to participate (intubated)

## 2023-10-09 NOTE — Transfer of Care (Signed)
Immediate Anesthesia Transfer of Care Note  Patient: [name] [name]  Procedure(s) Performed: EXPLORATORY LAPAROTOMY, PELVIC PACKING, TEMPORARY ABDOMINAL CLOSURE INSERTION OF TRACTION PIN RIGHT FEMUR (Right: Leg Upper)  Patient Location: ICU  Anesthesia Type:General  Level of Consciousness: Patient remains intubated per anesthesia plan  Airway & Oxygen Therapy: Patient remains intubated per anesthesia plan and Patient placed on Ventilator (see vital sign flow sheet for setting)  Post-op Assessment: Report given to RN and Post -op Vital signs reviewed and stable  Post vital signs: Reviewed and unstable  Last Vitals:  Vitals Value Taken Time  BP 119/84 10/09/23 0300  Temp 34.1 C 10/09/23 0311  Pulse 85 10/09/23 0311  Resp 8 10/09/23 0311  SpO2 90 % 10/09/23 0311  Vitals shown include unfiled device data.  Last Pain: There were no vitals filed for this visit.       Complications: No notable events documented.

## 2023-10-09 NOTE — Progress Notes (Signed)
Called to operating room to help transport patient on the servo I ventilator. In O.R patients Sp02= 74-77%. Placed patient on servo I with fi02 at 100% and Peep of 12cm. Patients plateau pressures were 46-49. Decreased tidal volume to 6ml and increased RR to 28bpm. Will obtain arterial blood gas and let physician know the results.
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
## 2023-10-10 ENCOUNTER — Inpatient Hospital Stay (HOSPITAL_COMMUNITY): Payer: MEDICAID

## 2023-10-10 ENCOUNTER — Encounter (HOSPITAL_COMMUNITY): Payer: Self-pay | Admitting: *Deleted

## 2023-10-10 ENCOUNTER — Encounter (HOSPITAL_COMMUNITY): Admission: EM | Disposition: A | Discharge status: Skilled Nursing Facility | Payer: Self-pay | Source: Home / Self Care

## 2023-10-10 ENCOUNTER — Inpatient Hospital Stay (HOSPITAL_COMMUNITY): Payer: MEDICAID | Admitting: Certified Registered Nurse Anesthetist

## 2023-10-10 ENCOUNTER — Other Ambulatory Visit: Payer: Self-pay

## 2023-10-10 DIAGNOSIS — S36899A Unspecified injury of other intra-abdominal organs, initial encounter: Secondary | ICD-10-CM | POA: Diagnosis not present

## 2023-10-10 HISTORY — PX: LAPAROTOMY: SHX154

## 2023-10-10 LAB — PREPARE FRESH FROZEN PLASMA
Unit division: 0
Unit division: 0
Unit division: 0
Unit division: 0
Unit division: 0
Unit division: 0
Unit division: 0
Unit division: 0
Unit division: 0
Unit division: 0
Unit division: 0
Unit division: 0
Unit division: 0
Unit division: 0
Unit division: 0
Unit division: 0
Unit division: 0
Unit division: 0
Unit division: 0
Unit division: 0
Unit division: 0
Unit division: 0

## 2023-10-10 LAB — BPAM FFP
Blood Product Expiration Date: 202411142359
Blood Product Expiration Date: 202411142359
Blood Product Expiration Date: 202411152359
Blood Product Expiration Date: 202411162359
Blood Product Expiration Date: 202411162359
Blood Product Expiration Date: 202411162359
Blood Product Expiration Date: 202411162359
Blood Product Expiration Date: 202411162359
Blood Product Expiration Date: 202411162359
Blood Product Expiration Date: 202411162359
Blood Product Expiration Date: 202411162359
Blood Product Expiration Date: 202411162359
Blood Product Expiration Date: 202411162359
Blood Product Expiration Date: 202411162359
Blood Product Expiration Date: 202411162359
Blood Product Expiration Date: 202411162359
Blood Product Expiration Date: 202411162359
Blood Product Expiration Date: 202411162359
Blood Product Expiration Date: 202411162359
Blood Product Expiration Date: 202411162359
Blood Product Expiration Date: 202411172359
Blood Product Expiration Date: 202411172359
Blood Product Expiration Date: 202411172359
Blood Product Expiration Date: 202411172359
Blood Product Expiration Date: 202411172359
Blood Product Expiration Date: 202411172359
Blood Product Expiration Date: 202411172359
Blood Product Expiration Date: 202411222359
Blood Product Expiration Date: 202411232359
Blood Product Expiration Date: 202411232359
Blood Product Expiration Date: 202411242359
Blood Product Expiration Date: 202411242359
ISSUE DATE / TIME: 202411112112
ISSUE DATE / TIME: 202411112112
ISSUE DATE / TIME: 202411112112
ISSUE DATE / TIME: 202411112112
ISSUE DATE / TIME: 202411112115
ISSUE DATE / TIME: 202411112115
ISSUE DATE / TIME: 202411112115
ISSUE DATE / TIME: 202411112115
ISSUE DATE / TIME: 202411112115
ISSUE DATE / TIME: 202411112115
ISSUE DATE / TIME: 202411112115
ISSUE DATE / TIME: 202411112115
ISSUE DATE / TIME: 202411112143
ISSUE DATE / TIME: 202411112143
ISSUE DATE / TIME: 202411112143
ISSUE DATE / TIME: 202411112143
ISSUE DATE / TIME: 202411112336
ISSUE DATE / TIME: 202411112336
ISSUE DATE / TIME: 202411112336
ISSUE DATE / TIME: 202411120015
ISSUE DATE / TIME: 202411120045
ISSUE DATE / TIME: 202411120719
ISSUE DATE / TIME: 202411120719
ISSUE DATE / TIME: 202411120719
ISSUE DATE / TIME: 202411121920
ISSUE DATE / TIME: 202411121920
ISSUE DATE / TIME: 202411121920
ISSUE DATE / TIME: 202411121920
ISSUE DATE / TIME: 202411121920
ISSUE DATE / TIME: 202411121920
ISSUE DATE / TIME: 202411121920
ISSUE DATE / TIME: 202411121920
Unit Type and Rh: 600
Unit Type and Rh: 600
Unit Type and Rh: 6200
Unit Type and Rh: 6200
Unit Type and Rh: 6200
Unit Type and Rh: 6200
Unit Type and Rh: 6200
Unit Type and Rh: 6200
Unit Type and Rh: 6200
Unit Type and Rh: 6200
Unit Type and Rh: 6200
Unit Type and Rh: 6200
Unit Type and Rh: 6200
Unit Type and Rh: 6200
Unit Type and Rh: 6200
Unit Type and Rh: 6200
Unit Type and Rh: 6200
Unit Type and Rh: 6200
Unit Type and Rh: 6200
Unit Type and Rh: 6200
Unit Type and Rh: 6200
Unit Type and Rh: 6200
Unit Type and Rh: 6200
Unit Type and Rh: 6200
Unit Type and Rh: 6200
Unit Type and Rh: 6200
Unit Type and Rh: 6200
Unit Type and Rh: 6200
Unit Type and Rh: 6200
Unit Type and Rh: 6200
Unit Type and Rh: 6200
Unit Type and Rh: 6200

## 2023-10-10 LAB — BPAM PLATELET PHERESIS
Blood Product Expiration Date: 202411142359
Blood Product Expiration Date: 202411142359
Blood Product Expiration Date: 202411152359
ISSUE DATE / TIME: 202411112117
ISSUE DATE / TIME: 202411112337
ISSUE DATE / TIME: 202411120007
Unit Type and Rh: 6200
Unit Type and Rh: 6200
Unit Type and Rh: 6200

## 2023-10-10 LAB — BPAM CRYOPRECIPITATE
Blood Product Expiration Date: 202411132359
Blood Product Expiration Date: 202411162359
ISSUE DATE / TIME: 202411112122
ISSUE DATE / TIME: 202411120006
Unit Type and Rh: 6200
Unit Type and Rh: 6200

## 2023-10-10 LAB — PREPARE CRYOPRECIPITATE
Unit division: 0
Unit division: 0

## 2023-10-10 LAB — PREPARE PLATELET PHERESIS
Unit division: 0
Unit division: 0
Unit division: 0

## 2023-10-10 LAB — CBC
HCT: 24.8 % — ABNORMAL LOW (ref 39.0–52.0)
HCT: 27.3 % — ABNORMAL LOW (ref 39.0–52.0)
Hemoglobin: 8.4 g/dL — ABNORMAL LOW (ref 13.0–17.0)
Hemoglobin: 9.2 g/dL — ABNORMAL LOW (ref 13.0–17.0)
MCH: 29.2 pg (ref 26.0–34.0)
MCH: 28.2 pg (ref 26.0–34.0)
MCHC: 33.9 g/dL (ref 30.0–36.0)
MCHC: 33.7 g/dL (ref 30.0–36.0)
MCV: 86.1 fL (ref 80.0–100.0)
MCV: 83.7 fL (ref 80.0–100.0)
Platelets: 48 10*3/uL — ABNORMAL LOW (ref 150–400)
Platelets: 40 10*3/uL — ABNORMAL LOW (ref 150–400)
RBC: 2.88 MIL/uL — ABNORMAL LOW (ref 4.22–5.81)
RBC: 3.26 MIL/uL — ABNORMAL LOW (ref 4.22–5.81)
RDW: 16.9 % — ABNORMAL HIGH (ref 11.5–15.5)
RDW: 19.9 % — ABNORMAL HIGH (ref 11.5–15.5)
WBC: 5.5 10*3/uL (ref 4.0–10.5)
WBC: 5.8 10*3/uL (ref 4.0–10.5)
nRBC: 0 % (ref 0.0–0.2)
nRBC: 0.3 % — ABNORMAL HIGH (ref 0.0–0.2)

## 2023-10-10 LAB — POCT I-STAT 7, (LYTES, BLD GAS, ICA,H+H)
Acid-Base Excess: 3 mmol/L — ABNORMAL HIGH (ref 0.0–2.0)
Bicarbonate: 29 mmol/L — ABNORMAL HIGH (ref 20.0–28.0)
Calcium, Ion: 0.95 mmol/L — ABNORMAL LOW (ref 1.15–1.40)
HCT: 22 % — ABNORMAL LOW (ref 39.0–52.0)
Hemoglobin: 7.5 g/dL — ABNORMAL LOW (ref 13.0–17.0)
O2 Saturation: 96 %
Patient temperature: 37.3
Potassium: 4.2 mmol/L (ref 3.5–5.1)
Sodium: 141 mmol/L (ref 135–145)
TCO2: 30 mmol/L (ref 22–32)
pCO2 arterial: 51.2 mmHg — ABNORMAL HIGH (ref 32–48)
pH, Arterial: 7.362 (ref 7.35–7.45)
pO2, Arterial: 87 mmHg (ref 83–108)

## 2023-10-10 LAB — BASIC METABOLIC PANEL
Anion gap: 8 (ref 5–15)
BUN: 6 mg/dL — ABNORMAL LOW (ref 8–23)
CO2: 27 mmol/L (ref 22–32)
Calcium: 6.9 mg/dL — ABNORMAL LOW (ref 8.9–10.3)
Chloride: 108 mmol/L (ref 98–111)
Creatinine, Ser: 1.23 mg/dL (ref 0.61–1.24)
GFR, Estimated: >60 mL/min (ref >60)
Glucose, Bld: 116 mg/dL — ABNORMAL HIGH (ref 70–99)
Potassium: 4.1 mmol/L (ref 3.5–5.1)
Sodium: 143 mmol/L (ref 135–145)

## 2023-10-10 LAB — TRIGLYCERIDES
Triglycerides: 683 mg/dL — ABNORMAL HIGH (ref <150)
Triglycerides: 906 mg/dL — ABNORMAL HIGH (ref <150)

## 2023-10-10 LAB — PREPARE RBC (CROSSMATCH)

## 2023-10-10 SURGERY — LAPAROTOMY, EXPLORATORY
Anesthesia: General

## 2023-10-10 SURGERY — CLOSED REDUCTION, PELVIS, WITH PERCUTANEOUS FIXATION
Anesthesia: General | Site: Shoulder | Laterality: Right

## 2023-10-10 MED ORDER — FENTANYL CITRATE (PF) 250 MCG/5ML IJ SOLN
INTRAMUSCULAR | Status: AC
  Filled 2023-10-10: qty 5

## 2023-10-10 MED ORDER — PROPOFOL 10 MG/ML IV BOLUS
INTRAVENOUS | Status: AC
  Filled 2023-10-10: qty 20

## 2023-10-10 MED ORDER — ALBUMIN HUMAN 5 % IV SOLN
12.5000 g | Freq: Once | INTRAVENOUS | Status: AC
  Administered 2023-10-10: 12.5 g via INTRAVENOUS
  Filled 2023-10-10: qty 250

## 2023-10-10 MED ORDER — LIDOCAINE 2% (20 MG/ML) 5 ML SYRINGE
INTRAMUSCULAR | Status: AC
  Filled 2023-10-10: qty 15

## 2023-10-10 MED ORDER — ROCURONIUM BROMIDE 10 MG/ML (PF) SYRINGE
PREFILLED_SYRINGE | INTRAVENOUS | Status: DC | PRN
  Administered 2023-10-10: 50 mg via INTRAVENOUS

## 2023-10-10 MED ORDER — MIDAZOLAM BOLUS VIA INFUSION
0.0000 mg | INTRAVENOUS | Status: DC | PRN
  Administered 2023-10-13: 2 mg via INTRAVENOUS
  Administered 2023-10-15: 1 mg via INTRAVENOUS
  Administered 2023-10-15: 4 mg via INTRAVENOUS
  Administered 2023-10-16: 1 mg via INTRAVENOUS
  Administered 2023-10-18 (×3): 2 mg via INTRAVENOUS
  Administered 2023-10-18: 1 mg via INTRAVENOUS
  Administered 2023-10-18: 2 mg via INTRAVENOUS
  Administered 2023-10-18 – 2023-10-19 (×2): 3 mg via INTRAVENOUS

## 2023-10-10 MED ORDER — SODIUM CHLORIDE 0.9% IV SOLUTION: Freq: Once | INTRAVENOUS | Status: AC

## 2023-10-10 MED ORDER — ACETAMINOPHEN 10 MG/ML IV SOLN
1000.0000 mg | Freq: Four times a day (QID) | INTRAVENOUS | Status: AC
  Administered 2023-10-10 – 2023-10-11 (×4): 1000 mg via INTRAVENOUS
  Filled 2023-10-10 (×4): qty 100

## 2023-10-10 MED ORDER — DEXAMETHASONE SODIUM PHOSPHATE 10 MG/ML IJ SOLN
INTRAMUSCULAR | Status: AC
  Filled 2023-10-10: qty 2

## 2023-10-10 MED ORDER — CEFAZOLIN SODIUM-DEXTROSE 2-4 GM/100ML-% IV SOLN
INTRAVENOUS | Status: AC
  Filled 2023-10-10: qty 100

## 2023-10-10 MED ORDER — MIDAZOLAM HCL 2 MG/2ML IJ SOLN
INTRAMUSCULAR | Status: AC
  Filled 2023-10-10: qty 2

## 2023-10-10 MED ORDER — CEFAZOLIN SODIUM-DEXTROSE 2-3 GM-%(50ML) IV SOLR
INTRAVENOUS | Status: DC | PRN
  Administered 2023-10-10: 2 g via INTRAVENOUS

## 2023-10-10 MED ORDER — MIDAZOLAM-SODIUM CHLORIDE 100-0.9 MG/100ML-% IV SOLN
0.0000 mg/h | INTRAVENOUS | Status: DC
  Administered 2023-10-10: 2 mg/h via INTRAVENOUS
  Administered 2023-10-11: 8 mg/h via INTRAVENOUS
  Administered 2023-10-11 – 2023-10-12 (×2): 6 mg/h via INTRAVENOUS
  Administered 2023-10-12: 5 mg/h via INTRAVENOUS
  Administered 2023-10-13 – 2023-10-15 (×5): 9 mg/h via INTRAVENOUS
  Administered 2023-10-15 – 2023-10-16 (×2): 10 mg/h via INTRAVENOUS
  Administered 2023-10-17 (×2): 7 mg/h via INTRAVENOUS
  Administered 2023-10-18: 5 mg/h via INTRAVENOUS
  Administered 2023-10-19: 4 mg/h via INTRAVENOUS
  Filled 2023-10-10 (×17): qty 100

## 2023-10-10 MED ORDER — 0.9 % SODIUM CHLORIDE (POUR BTL) OPTIME
TOPICAL | Status: DC | PRN
  Administered 2023-10-10: 4000 mL

## 2023-10-10 MED ORDER — CALCIUM GLUCONATE-NACL 2-0.675 GM/100ML-% IV SOLN
2.0000 g | Freq: Once | INTRAVENOUS | Status: AC
  Administered 2023-10-10: 2000 mg via INTRAVENOUS
  Filled 2023-10-10: qty 100

## 2023-10-10 MED ORDER — ONDANSETRON HCL 4 MG/2ML IJ SOLN
INTRAMUSCULAR | Status: AC
  Filled 2023-10-10: qty 4

## 2023-10-10 SURGICAL SUPPLY — 40 items
BAG COUNTER SPONGE SURGICOUNT (BAG) ×2 IMPLANT
BLADE CLIPPER SURG (BLADE) IMPLANT
CANISTER SUCT 3000ML PPV (MISCELLANEOUS) ×2 IMPLANT
CHLORAPREP W/TINT 26 (MISCELLANEOUS) ×2 IMPLANT
COVER SURGICAL LIGHT HANDLE (MISCELLANEOUS) ×2 IMPLANT
DRAPE LAPAROSCOPIC ABDOMINAL (DRAPES) ×2 IMPLANT
DRAPE WARM FLUID 44X44 (DRAPES) ×2 IMPLANT
DRSG OPSITE POSTOP 4X10 (GAUZE/BANDAGES/DRESSINGS) IMPLANT
DRSG OPSITE POSTOP 4X8 (GAUZE/BANDAGES/DRESSINGS) IMPLANT
DRSG VAC GRANUFOAM SM (GAUZE/BANDAGES/DRESSINGS) IMPLANT
ELECT BLADE 6.5 EXT (BLADE) IMPLANT
ELECT CAUTERY BLADE 6.4 (BLADE) ×2 IMPLANT
ELECT REM PT RETURN 9FT ADLT (ELECTROSURGICAL) ×1
ELECTRODE REM PT RTRN 9FT ADLT (ELECTROSURGICAL) ×2 IMPLANT
GLOVE BIO SURGEON STRL SZ8 (GLOVE) ×2 IMPLANT
GLOVE BIOGEL PI IND STRL 8 (GLOVE) ×2 IMPLANT
GOWN STRL REUS W/ TWL LRG LVL3 (GOWN DISPOSABLE) ×2 IMPLANT
GOWN STRL REUS W/ TWL XL LVL3 (GOWN DISPOSABLE) ×2 IMPLANT
GOWN STRL REUS W/TWL LRG LVL3 (GOWN DISPOSABLE) ×1
GOWN STRL REUS W/TWL XL LVL3 (GOWN DISPOSABLE) ×1
HANDLE SUCTION POOLE (INSTRUMENTS) ×2 IMPLANT
KIT BASIN OR (CUSTOM PROCEDURE TRAY) ×2 IMPLANT
KIT TURNOVER KIT B (KITS) ×2 IMPLANT
LIGASURE IMPACT 36 18CM CVD LR (INSTRUMENTS) IMPLANT
NS IRRIG 1000ML POUR BTL (IV SOLUTION) ×4 IMPLANT
PACK GENERAL/GYN (CUSTOM PROCEDURE TRAY) ×2 IMPLANT
PAD ARMBOARD 7.5X6 YLW CONV (MISCELLANEOUS) ×2 IMPLANT
SPECIMEN JAR LARGE (MISCELLANEOUS) IMPLANT
SPONGE ABDOMINAL VAC ABTHERA (MISCELLANEOUS) IMPLANT
SPONGE T-LAP 18X18 ~~LOC~~+RFID (SPONGE) IMPLANT
STAPLER VISISTAT 35W (STAPLE) ×2 IMPLANT
SUCTION POOLE HANDLE (INSTRUMENTS) ×1
SUT PDS AB 1 TP1 96 (SUTURE) ×4 IMPLANT
SUT SILK 2 0 SH CR/8 (SUTURE) ×2 IMPLANT
SUT SILK 2 0 TIES 10X30 (SUTURE) ×2 IMPLANT
SUT SILK 3 0 SH CR/8 (SUTURE) ×2 IMPLANT
SUT SILK 3 0 TIES 10X30 (SUTURE) ×2 IMPLANT
TOWEL GREEN STERILE (TOWEL DISPOSABLE) ×2 IMPLANT
TRAY FOLEY MTR SLVR 16FR STAT (SET/KITS/TRAYS/PACK) IMPLANT
YANKAUER SUCT BULB TIP NO VENT (SUCTIONS) IMPLANT
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
## 2023-10-10 NOTE — Progress Notes (Signed)
Trauma Event Note   Provided update to sister Juliette Mangle, anticipate her arrival around 7:30 this morning  Gayla Doss Shilo Pauwels  Trauma Response RN  Please call TRN at [phone] for further assistance.

## 2023-10-10 NOTE — Progress Notes (Signed)
Patient ID: [name] [name], male   DOB: 1961-02-21, 62 y.o.   MRN: [id number] Follow up - Trauma Critical Care   Patient Details:    [name] [name] is an 62 y.o. male.  Lines/tubes : Airway 8 mm (Active)  Secured at (cm) 24 cm 10/10/23 0318  Measured From Lips 10/10/23 0318  Secured Location Center 10/10/23 0318  Secured By [employer] 10/10/23 0318  Tube Holder Repositioned Yes 10/10/23 0318  Prone position No 10/10/23 0318  Cuff Pressure (cm H2O) Clear OR 27-39 CmH2O 10/09/23 2302  Site Condition Dry 10/10/23 0318     CVC Triple Lumen 10/08/23 Right Internal jugular (Active)  Indication for Insertion or Continuance of Line Vasoactive infusions 10/09/23 2000  Site Assessment Dry;Intact 10/09/23 2000  Proximal Lumen Status Infusing 10/09/23 2000  Medial Lumen Status Flushed;Saline locked 10/09/23 2000  Distal Lumen Status Flushed;Saline locked 10/09/23 2000  Dressing Type Transparent 10/09/23 2000  Dressing Status Antimicrobial disc in place 10/09/23 2000  Line Care Connections checked and tightened 10/09/23 2000  Dressing Intervention New dressing;Dressing changed;Antimicrobial disc changed 10/09/23 1600  Dressing Change Due 10/16/23 10/09/23 2000     Arterial Line 10/08/23 Left Radial (Active)  Site Assessment Clean, Dry, Intact 10/09/23 2000  Art Line Waveform Appropriate 10/09/23 2000  Art Line Interventions Zeroed and calibrated;Leveled;Connections checked and tightened;Flushed per protocol 10/09/23 2000  Color/Movement/Sensation Capillary refill less than 3 sec 10/09/23 2000  Dressing Type Transparent;Securing device 10/09/23 2000  Dressing Status Antimicrobial disc in place;Old drainage;Dry;Intact 10/09/23 2000  Dressing Change Due 10/15/23 10/09/23 2000     Urethral Catheter Lane Temperature probe 16 Fr. (Active)  Indication for Insertion or Continuance of Catheter Bladder outlet obstruction / other urologic reason;Unstable critically ill patients first 24-48 hours  (See Criteria);Unstable spinal/crush injuries / Multisystem Trauma 10/10/23 0400  Site Assessment Clean, Dry, Intact 10/10/23 0400  Catheter Maintenance Bag below level of bladder;Catheter secured;Drainage bag/tubing not touching floor;Insertion date on drainage bag;No dependent loops 10/09/23 2000  Collection Container Standard drainage bag 10/10/23 0400  Securement Method Adhesive securement device 10/10/23 0400  Output (mL) 150 mL 10/10/23 0600    Microbiology/Sepsis markers: Results for orders placed or performed during the hospital encounter of 10/08/23  MRSA Next Gen by PCR, Nasal     Status: None   Collection Time: 10/09/23  3:07 AM   Specimen: Nasal Mucosa; Nasal Swab  Result Value Ref Range Status   MRSA by PCR Next Gen NOT DETECTED NOT DETECTED Final    Comment: (NOTE) The GeneXpert MRSA Assay (FDA approved for NASAL specimens only), is one component of a comprehensive MRSA colonization surveillance program. It is not intended to diagnose MRSA infection nor to guide or monitor treatment for MRSA infections. Test performance is not FDA approved in patients less than 15 years old. Performed at [facility], [address], [place], [place] [address]     Anti-infectives:  Anti-infectives (From admission, onward)    None       Consults: Treatment Team:  Despina Arias, MD Haddix, Gillie Manners, MD Victorino Sparrow, MD    Studies:    Events:  Subjective:    Overnight Issues: levo 7, dressing changed at 0400, family located  Objective:  Vital signs for last 24 hours: Temp:  [96.6 F (35.9 C)-99.9 F (37.7 C)] 99.3 F (37.4 C) (11/13 0600) Pulse Rate:  [80-101] 82 (11/13 0600) Resp:  [0-30] 0 (11/13 0600) BP: (96-123)/(57-91) 111/57 (11/13 0600) SpO2:  [96 %-100 %] 100 % (  11/13 0600) Arterial Line BP: (99-188)/(46-77) 120/52 (11/13 0630) FiO2 (%):  [70 %-100 %] 70 % (11/13 0318)  Hemodynamic parameters for last 24 hours:    Intake/Output from  previous day: 11/12 0701 - 11/13 0700 In: 2660.5 [I.V.:2610.5; IV Piggyback:50] Out: 1975 [Urine:1975]  Intake/Output this shift: No intake/output data recorded.  Vent settings for last 24 hours: Vent Mode: PRVC FiO2 (%):  [70 %-100 %] 70 % Set Rate:  [28 bmp] 28 bmp Vt Set:  [450 mL] 450 mL PEEP:  [10 cmH20-12 cmH20] 10 cmH20 Plateau Pressure:  [24 cmH20-26 cmH20] 24 cmH20  Physical Exam:  General: sedated on vent Neuro: sedated HEENT/Neck: ETT, L scalp abrasion Resp: clear to auscultation bilaterally CVS: RRR GI: open abdomen with vicryl mesh Extremities: TXN RLE  Results for orders placed or performed during the hospital encounter of 10/08/23 (from the past 24 hour(s))  I-STAT 7, (LYTES, BLD GAS, ICA, H+H)     Status: Abnormal   Collection Time: 10/09/23  8:16 AM  Result Value Ref Range   pH, Arterial 7.301 (L) 7.35 - 7.45   pCO2 arterial 43.1 32 - 48 mmHg   pO2, Arterial 72 (L) 83 - 108 mmHg   Bicarbonate 21.5 20.0 - 28.0 mmol/L   TCO2 23 22 - 32 mmol/L   O2 Saturation 93 %   Acid-base deficit 5.0 (H) 0.0 - 2.0 mmol/L   Sodium 142 135 - 145 mmol/L   Potassium 4.2 3.5 - 5.1 mmol/L   Calcium, Ion 1.04 (L) 1.15 - 1.40 mmol/L   HCT 31.0 (L) 39.0 - 52.0 %   Hemoglobin 10.5 (L) 13.0 - 17.0 g/dL   Patient temperature 16.1 C    Collection site RADIAL, ALLEN'S TEST ACCEPTABLE    Drawn by RT    Sample type ARTERIAL   CBC     Status: Abnormal   Collection Time: 10/09/23  1:11 PM  Result Value Ref Range   WBC 3.3 (L) 4.0 - 10.5 K/uL   RBC 3.23 (L) 4.22 - 5.81 MIL/uL   Hemoglobin 9.6 (L) 13.0 - 17.0 g/dL   HCT 09.6 (L) 04.5 - 40.9 %   MCV 84.5 80.0 - 100.0 fL   MCH 29.7 26.0 - 34.0 pg   MCHC 35.2 30.0 - 36.0 g/dL   RDW 81.1 (H) 91.4 - 78.2 %   Platelets 49 (L) 150 - 400 K/uL   nRBC 0.0 0.0 - 0.2 %  I-STAT 7, (LYTES, BLD GAS, ICA, H+H)     Status: Abnormal   Collection Time: 10/09/23  4:23 PM  Result Value Ref Range   pH, Arterial 7.355 7.35 - 7.45   pCO2 arterial  47.9 32 - 48 mmHg   pO2, Arterial 180 (H) 83 - 108 mmHg   Bicarbonate 26.6 20.0 - 28.0 mmol/L   TCO2 28 22 - 32 mmol/L   O2 Saturation 100 %   Acid-Base Excess 1.0 0.0 - 2.0 mmol/L   Sodium 143 135 - 145 mmol/L   Potassium 3.9 3.5 - 5.1 mmol/L   Calcium, Ion 0.99 (L) 1.15 - 1.40 mmol/L   HCT 23.0 (L) 39.0 - 52.0 %   Hemoglobin 7.8 (L) 13.0 - 17.0 g/dL   Patient temperature 95.6 C    Collection site RADIAL, ALLEN'S TEST ACCEPTABLE    Drawn by RT    Sample type ARTERIAL   CBC     Status: Abnormal   Collection Time: 10/09/23  7:41 PM  Result Value Ref Range   WBC 3.5 (  L) 4.0 - 10.5 K/uL   RBC 3.16 (L) 4.22 - 5.81 MIL/uL   Hemoglobin 9.2 (L) 13.0 - 17.0 g/dL   HCT 01.0 (L) 27.2 - 53.6 %   MCV 82.6 80.0 - 100.0 fL   MCH 29.1 26.0 - 34.0 pg   MCHC 35.2 30.0 - 36.0 g/dL   RDW 64.4 (H) 03.4 - 74.2 %   Platelets 47 (L) 150 - 400 K/uL   nRBC 0.0 0.0 - 0.2 %  Triglycerides     Status: Abnormal   Collection Time: 10/10/23  4:34 AM  Result Value Ref Range   Triglycerides 683 (H) <150 mg/dL  CBC     Status: Abnormal   Collection Time: 10/10/23  4:34 AM  Result Value Ref Range   WBC 5.5 4.0 - 10.5 K/uL   RBC 2.88 (L) 4.22 - 5.81 MIL/uL   Hemoglobin 8.4 (L) 13.0 - 17.0 g/dL   HCT 59.5 (L) 63.8 - 75.6 %   MCV 86.1 80.0 - 100.0 fL   MCH 29.2 26.0 - 34.0 pg   MCHC 33.9 30.0 - 36.0 g/dL   RDW 43.3 (H) 29.5 - 18.8 %   Platelets 48 (L) 150 - 400 K/uL   nRBC 0.0 0.0 - 0.2 %  Basic metabolic panel     Status: Abnormal   Collection Time: 10/10/23  4:34 AM  Result Value Ref Range   Sodium 143 135 - 145 mmol/L   Potassium 4.1 3.5 - 5.1 mmol/L   Chloride 108 98 - 111 mmol/L   CO2 27 22 - 32 mmol/L   Glucose, Bld 116 (H) 70 - 99 mg/dL   BUN 6 (L) 8 - 23 mg/dL   Creatinine, Ser 4.16 0.61 - 1.24 mg/dL   Calcium 6.9 (L) 8.9 - 10.3 mg/dL   GFR, Estimated >60 >63 mL/min   Anion gap 8 5 - 15  I-STAT 7, (LYTES, BLD GAS, ICA, H+H)     Status: Abnormal   Collection Time: 10/10/23  4:59 AM   Result Value Ref Range   pH, Arterial 7.362 7.35 - 7.45   pCO2 arterial 51.2 (H) 32 - 48 mmHg   pO2, Arterial 87 83 - 108 mmHg   Bicarbonate 29.0 (H) 20.0 - 28.0 mmol/L   TCO2 30 22 - 32 mmol/L   O2 Saturation 96 %   Acid-Base Excess 3.0 (H) 0.0 - 2.0 mmol/L   Sodium 141 135 - 145 mmol/L   Potassium 4.2 3.5 - 5.1 mmol/L   Calcium, Ion 0.95 (L) 1.15 - 1.40 mmol/L   HCT 22.0 (L) 39.0 - 52.0 %   Hemoglobin 7.5 (L) 13.0 - 17.0 g/dL   Patient temperature 01.6 C    Collection site art line    Drawn by RT    Sample type ARTERIAL     Assessment & Plan: Present on Admission: **None**    LOS: 2 days   Additional comments:I reviewed the patient's new clinical lab test results. And CXR [facility]  S/P ex lap, pelvic packing (5 laps), and temporary abdominal closure with vicryl mesh by Dr. Dossie Der 11/12 - return to OR today for ex lap, removal of packs and Abthera placement - see below Aortic transsection - Dr. Sherral Hammers did TEVAR 11/12 R acetabular FX, B superior and inferior rami FXs, R iliac FX, L sacral FX - Dr. Hulda Humphrey consulted, R femur traction pin - will clarify traction, Ortho Trauma will take over. I D/W Dr. Jena Gauss yesterday. Pelvic hemorrhage - packed as above, S/P  IR embolization by Dr. Elby Showers 11/12 Urology consulted (Dr. Roxy Horseman) - no bladder injury found, uncertain status of R ureter Acute hypoxic ventilator dependent respiratory failure - TRALI, 70% and PEEP 10 Right gluteal hematoma Right psoas hematoma Right 11th rib fracture Multiple lumbar vertebral body transverse process fractures Right scalp hematoma ABL anemia - 2u PRBC now FEN - volume resuscitate VTE - no LMWH yet as PLTs under 100K and Hb not stable Dispo - ICU, OR I spoke with his sister, [name] [name], at the bedside. She reports Gwynn is divorced so Leta Jungling and Kevion's mother are his decision makers. I updated her on his injuries and the plan of care. I discussed the planned surgery including the procedure, risks,  and benefits. She also consents for blood products.  Critical Care Total Time*: 37 Minutes  Violeta Gelinas, MD, MPH, FACS Trauma & General Surgery Use [email or web address] to contact on call provider  10/10/2023  *Care during the described time interval was provided by me. I have reviewed this patient's available data, including medical history, events of note, physical examination and test results as part of my evaluation.

## 2023-10-10 NOTE — Progress Notes (Signed)
Pt transported from OR to 4N32 on vent w/o any complications.

## 2023-10-10 NOTE — Anesthesia Postprocedure Evaluation (Signed)
Anesthesia Post Note  Patient: [name] [name]  Procedure(s) Performed: EXPLORATORY LAPAROTOMY, PELVIC PACKING, TEMPORARY ABDOMINAL CLOSURE; EXPLORATION OF PELVIS INSERTION OF TRACTION PIN RIGHT FEMUR (Right: Leg Upper) EXPLORATION OF PELVIS     Patient location during evaluation: Other Anesthesia Type: General Level of consciousness: sedated and patient remains intubated per anesthesia plan Pain management: pain level controlled Vital Signs Assessment: vitals unstable Respiratory status: patient remains intubated per anesthesia plan and patient on ventilator - see flowsheet for VS (beginning to wean O2 requirements) Cardiovascular status: unstable (requiring Norepi support) Anesthetic complications: no   No notable events documented.  Last Vitals:  Vitals:   10/10/23 1120 10/10/23 1135  BP:    Pulse: 76 76  Resp: (!) 0 (!) 0  Temp: 37.2 C 37.2 C  SpO2: 100%     Last Pain:  Vitals:   10/10/23 0959  TempSrc: Bladder                 Shamariah Shewmake,E. Lonetta Blassingame
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
## 2023-10-10 NOTE — Plan of Care (Signed)
?  Problem: Clinical Measurements: ?Goal: Will remain free from infection ?Outcome: Progressing ?  ?

## 2023-10-10 NOTE — Plan of Care (Signed)
  Problem: Education: Goal: Knowledge of General Education information will improve Description: Including pain rating scale, medication(s)/side effects and non-pharmacologic comfort measures Outcome: Progressing   Problem: Clinical Measurements: Goal: Will remain free from infection Outcome: Progressing Goal: Respiratory complications will improve Outcome: Progressing   Problem: Nutrition: Goal: Adequate nutrition will be maintained Outcome: Progressing   Problem: Skin Integrity: Goal: Risk for impaired skin integrity will decrease Outcome: Progressing
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
## 2023-10-10 NOTE — OR Nursing (Signed)
Dr. Janee Morn removed 5 packed lap sponges from patient and they were scanned out in surgicounter.
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
## 2023-10-10 NOTE — Progress Notes (Signed)
Pt w/ ETT adhesive device in place, therefore can not assess wound on right cheek.

## 2023-10-10 NOTE — Progress Notes (Signed)
Pt off unit to O.R. around 1145.

## 2023-10-10 NOTE — Transfer of Care (Signed)
Immediate Anesthesia Transfer of Care Note  Patient: [name] [name]  Procedure(s) Performed: EXPLORATORY LAPAROTOMY - OPEN ABDOMEN ABTHERA  Patient Location: ICU  Anesthesia Type:General  Level of Consciousness: sedated, unresponsive, and Patient remains intubated per anesthesia plan  Airway & Oxygen Therapy: Patient remains intubated per anesthesia plan and Patient placed on Ventilator (see vital sign flow sheet for setting)  Post-op Assessment: Report given to RN and Post -op Vital signs reviewed and stable  Post vital signs: Reviewed and stable  Last Vitals:  Vitals Value Taken Time  BP 130/80   Temp    Pulse 84   Resp 20   SpO2 100 % 10/10/23 1312    Last Pain:  Vitals:   10/10/23 0959  TempSrc: Bladder         Complications: No notable events documented.

## 2023-10-10 NOTE — Progress Notes (Signed)
Pt back on unit at 1315.

## 2023-10-11 ENCOUNTER — Encounter (HOSPITAL_COMMUNITY): Payer: Self-pay | Admitting: Vascular Surgery

## 2023-10-11 ENCOUNTER — Inpatient Hospital Stay (HOSPITAL_COMMUNITY): Payer: MEDICAID

## 2023-10-11 LAB — BPAM RBC
Blood Product Expiration Date: 202411192359
Blood Product Expiration Date: 202411202359
Blood Product Expiration Date: 202411202359
Blood Product Expiration Date: 202411272359
Blood Product Expiration Date: 202411272359
Blood Product Expiration Date: 202411282359
Blood Product Expiration Date: 202411282359
Blood Product Expiration Date: 202411282359
Blood Product Expiration Date: 202411282359
Blood Product Expiration Date: 202411282359
Blood Product Expiration Date: 202411292359
Blood Product Expiration Date: 202411292359
Blood Product Expiration Date: 202411292359
Blood Product Expiration Date: 202411292359
Blood Product Expiration Date: 202411292359
Blood Product Expiration Date: 202411292359
Blood Product Expiration Date: 202411302359
Blood Product Expiration Date: 202411302359
Blood Product Expiration Date: 202411302359
Blood Product Expiration Date: 202411302359
Blood Product Expiration Date: 202411302359
Blood Product Expiration Date: 202412012359
Blood Product Expiration Date: 202412012359
Blood Product Expiration Date: 202412012359
Blood Product Expiration Date: 202412012359
Blood Product Expiration Date: 202412012359
Blood Product Expiration Date: 202412012359
Blood Product Expiration Date: 202412012359
Blood Product Expiration Date: 202412012359
Blood Product Expiration Date: 202412012359
Blood Product Expiration Date: 202412012359
Blood Product Expiration Date: 202412012359
Blood Product Expiration Date: 202412142359
Blood Product Expiration Date: 202412142359
Blood Product Expiration Date: 202412142359
Blood Product Expiration Date: 202412142359
ISSUE DATE / TIME: 202411112048
ISSUE DATE / TIME: 202411112048
ISSUE DATE / TIME: 202411112048
ISSUE DATE / TIME: 202411112048
ISSUE DATE / TIME: 202411112112
ISSUE DATE / TIME: 202411112112
ISSUE DATE / TIME: 202411112115
ISSUE DATE / TIME: 202411112115
ISSUE DATE / TIME: 202411112115
ISSUE DATE / TIME: 202411112115
ISSUE DATE / TIME: 202411112115
ISSUE DATE / TIME: 202411112115
ISSUE DATE / TIME: 202411112115
ISSUE DATE / TIME: 202411112115
ISSUE DATE / TIME: 202411112125
ISSUE DATE / TIME: 202411112125
ISSUE DATE / TIME: 202411112125
ISSUE DATE / TIME: 202411112125
ISSUE DATE / TIME: 202411112331
ISSUE DATE / TIME: 202411112331
ISSUE DATE / TIME: 202411112331
ISSUE DATE / TIME: 202411112355
ISSUE DATE / TIME: 202411112355
ISSUE DATE / TIME: 202411112355
ISSUE DATE / TIME: 202411112355
ISSUE DATE / TIME: 202411112355
ISSUE DATE / TIME: 202411120656
ISSUE DATE / TIME: 202411120719
ISSUE DATE / TIME: 202411120827
ISSUE DATE / TIME: 202411121146
ISSUE DATE / TIME: 202411121202
ISSUE DATE / TIME: 202411122230
ISSUE DATE / TIME: 202411130452
ISSUE DATE / TIME: 202411130922
ISSUE DATE / TIME: 202411130922
ISSUE DATE / TIME: 202411131201
Unit Type and Rh: 5100
Unit Type and Rh: 5100
Unit Type and Rh: 5100
Unit Type and Rh: 5100
Unit Type and Rh: 5100
Unit Type and Rh: 5100
Unit Type and Rh: 5100
Unit Type and Rh: 5100
Unit Type and Rh: 5100
Unit Type and Rh: 5100
Unit Type and Rh: 5100
Unit Type and Rh: 5100
Unit Type and Rh: 5100
Unit Type and Rh: 5100
Unit Type and Rh: 5100
Unit Type and Rh: 5100
Unit Type and Rh: 5100
Unit Type and Rh: 5100
Unit Type and Rh: 5100
Unit Type and Rh: 5100
Unit Type and Rh: 5100
Unit Type and Rh: 5100
Unit Type and Rh: 6200
Unit Type and Rh: 6200
Unit Type and Rh: 6200
Unit Type and Rh: 6200
Unit Type and Rh: 6200
Unit Type and Rh: 6200
Unit Type and Rh: 6200
Unit Type and Rh: 6200
Unit Type and Rh: 6200
Unit Type and Rh: 6200
Unit Type and Rh: 6200
Unit Type and Rh: 6200
Unit Type and Rh: 6200
Unit Type and Rh: 6200

## 2023-10-11 LAB — TYPE AND SCREEN
ABO/RH(D): A POS
Antibody Screen: NEGATIVE
Unit division: 0
Unit division: 0
Unit division: 0
Unit division: 0
Unit division: 0
Unit division: 0
Unit division: 0
Unit division: 0
Unit division: 0
Unit division: 0
Unit division: 0
Unit division: 0
Unit division: 0
Unit division: 0
Unit division: 0
Unit division: 0
Unit division: 0
Unit division: 0
Unit division: 0
Unit division: 0
Unit division: 0
Unit division: 0
Unit division: 0
Unit division: 0
Unit division: 0
Unit division: 0
Unit division: 0
Unit division: 0
Unit division: 0
Unit division: 0
Unit division: 0
Unit division: 0
Unit division: 0
Unit division: 0
Unit division: 0
Unit division: 0

## 2023-10-11 LAB — BASIC METABOLIC PANEL
Anion gap: 7 (ref 5–15)
BUN: 6 mg/dL — ABNORMAL LOW (ref 8–23)
CO2: 27 mmol/L (ref 22–32)
Calcium: 6.6 mg/dL — ABNORMAL LOW (ref 8.9–10.3)
Chloride: 104 mmol/L (ref 98–111)
Creatinine, Ser: 1.26 mg/dL — ABNORMAL HIGH (ref 0.61–1.24)
GFR, Estimated: >60 mL/min (ref >60)
Glucose, Bld: 119 mg/dL — ABNORMAL HIGH (ref 70–99)
Potassium: 4.1 mmol/L (ref 3.5–5.1)
Sodium: 138 mmol/L (ref 135–145)

## 2023-10-11 LAB — POCT I-STAT 7, (LYTES, BLD GAS, ICA,H+H)
Acid-Base Excess: 1 mmol/L (ref 0.0–2.0)
Acid-Base Excess: 2 mmol/L (ref 0.0–2.0)
Bicarbonate: 28.2 mmol/L — ABNORMAL HIGH (ref 20.0–28.0)
Bicarbonate: 29.9 mmol/L — ABNORMAL HIGH (ref 20.0–28.0)
Calcium, Ion: 0.96 mmol/L — ABNORMAL LOW (ref 1.15–1.40)
Calcium, Ion: 1 mmol/L — ABNORMAL LOW (ref 1.15–1.40)
HCT: 22 % — ABNORMAL LOW (ref 39.0–52.0)
HCT: 23 % — ABNORMAL LOW (ref 39.0–52.0)
Hemoglobin: 7.5 g/dL — ABNORMAL LOW (ref 13.0–17.0)
Hemoglobin: 7.8 g/dL — ABNORMAL LOW (ref 13.0–17.0)
O2 Saturation: 86 %
O2 Saturation: 86 %
Patient temperature: 36.6
Patient temperature: 99.5
Potassium: 4.2 mmol/L (ref 3.5–5.1)
Potassium: 4.5 mmol/L (ref 3.5–5.1)
Sodium: 141 mmol/L (ref 135–145)
Sodium: 142 mmol/L (ref 135–145)
TCO2: 30 mmol/L (ref 22–32)
TCO2: 32 mmol/L (ref 22–32)
pCO2 arterial: 55.8 mmHg — ABNORMAL HIGH (ref 32–48)
pCO2 arterial: 65.7 mmHg (ref 32–48)
pH, Arterial: 7.309 — ABNORMAL LOW (ref 7.35–7.45)
pH, Arterial: 7.268 — ABNORMAL LOW (ref 7.35–7.45)
pO2, Arterial: 56 mmHg — ABNORMAL LOW (ref 83–108)
pO2, Arterial: 62 mmHg — ABNORMAL LOW (ref 83–108)

## 2023-10-11 LAB — CBC
HCT: 24.6 % — ABNORMAL LOW (ref 39.0–52.0)
HCT: 20.7 % — ABNORMAL LOW (ref 39.0–52.0)
Hemoglobin: 8.1 g/dL — ABNORMAL LOW (ref 13.0–17.0)
Hemoglobin: 6.6 g/dL — CL (ref 13.0–17.0)
MCH: 28.3 pg (ref 26.0–34.0)
MCH: 27.8 pg (ref 26.0–34.0)
MCHC: 32.9 g/dL (ref 30.0–36.0)
MCHC: 31.9 g/dL (ref 30.0–36.0)
MCV: 86 fL (ref 80.0–100.0)
MCV: 87.3 fL (ref 80.0–100.0)
Platelets: 52 10*3/uL — ABNORMAL LOW (ref 150–400)
Platelets: 44 10*3/uL — ABNORMAL LOW (ref 150–400)
RBC: 2.86 MIL/uL — ABNORMAL LOW (ref 4.22–5.81)
RBC: 2.37 MIL/uL — ABNORMAL LOW (ref 4.22–5.81)
RDW: 19.6 % — ABNORMAL HIGH (ref 11.5–15.5)
RDW: 19.5 % — ABNORMAL HIGH (ref 11.5–15.5)
WBC: 6.2 10*3/uL (ref 4.0–10.5)
WBC: 3.9 10*3/uL — ABNORMAL LOW (ref 4.0–10.5)
nRBC: 0.5 % — ABNORMAL HIGH (ref 0.0–0.2)
nRBC: 1 % — ABNORMAL HIGH (ref 0.0–0.2)

## 2023-10-11 LAB — PREPARE RBC (CROSSMATCH)

## 2023-10-11 LAB — TRIGLYCERIDES: Triglycerides: 821 mg/dL — ABNORMAL HIGH (ref <150)

## 2023-10-11 MED ORDER — ALBUMIN HUMAN 5 % IV SOLN
25.0000 g | Freq: Once | INTRAVENOUS | Status: AC
  Administered 2023-10-11: 25 g via INTRAVENOUS
  Filled 2023-10-11: qty 500

## 2023-10-11 MED ORDER — FAMOTIDINE 20 MG PO TABS
20.0000 mg | ORAL_TABLET | Freq: Two times a day (BID) | ORAL | Status: DC
  Administered 2023-10-11 – 2023-10-29 (×35): 20 mg
  Filled 2023-10-11 (×35): qty 1

## 2023-10-11 MED ORDER — SODIUM CHLORIDE 0.9 % IV SOLN
2.0000 g | Freq: Two times a day (BID) | INTRAVENOUS | Status: DC
  Administered 2023-10-11 – 2023-10-12 (×3): 2 g via INTRAVENOUS
  Filled 2023-10-11 (×3): qty 12.5

## 2023-10-11 MED ORDER — SODIUM CHLORIDE 0.9 % IV SOLN
4.0000 g | Freq: Once | INTRAVENOUS | Status: AC
  Administered 2023-10-11: 4 g via INTRAVENOUS
  Filled 2023-10-11: qty 40

## 2023-10-11 MED ORDER — SODIUM CHLORIDE 0.9% IV SOLUTION: Freq: Once | INTRAVENOUS | Status: AC
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
## 2023-10-11 NOTE — Anesthesia Postprocedure Evaluation (Signed)
Anesthesia Post Note  Patient: [name] [name]  Procedure(s) Performed: EXPLORATORY LAPAROTOMY - OPEN ABDOMEN ABTHERA     Patient location during evaluation: SICU Anesthesia Type: General Level of consciousness: sedated Pain management: pain level controlled Vital Signs Assessment: post-procedure vital signs reviewed and stable Respiratory status: patient remains intubated per anesthesia plan Cardiovascular status: stable Postop Assessment: no apparent nausea or vomiting Anesthetic complications: no   No notable events documented.  Last Vitals:  Vitals:   10/11/23 0630 10/11/23 0645  BP:    Pulse: 93 90  Resp: (!) 28 (!) 28  Temp: 37.4 C 37.4 C  SpO2: 100% 100%    Last Pain:  Vitals:   10/10/23 1600  TempSrc: Bladder                 Earl Lites P Shatera Rennert

## 2023-10-11 NOTE — Progress Notes (Signed)
Patient ID: [name] [name], male   DOB: 08/07/1961, 62 y.o.   MRN: [id number] Follow up - Trauma Critical Care   Patient Details:    [name] [name] is an 62 y.o. male.  Lines/tubes : Airway 8 mm (Active)  Secured at (cm) 24 cm 10/11/23 0757  Measured From Lips 10/11/23 0757  Secured Location Center 10/11/23 0757  Secured By [employer] 10/11/23 0757  Tube Holder Repositioned Yes 10/11/23 0757  Prone position No 10/11/23 0328  Cuff Pressure (cm H2O) Green OR 18-26 [facility] 10/11/23 0757  Site Condition Dry;Cool 10/11/23 0757     CVC Triple Lumen 10/08/23 Right Internal jugular (Active)  Indication for Insertion or Continuance of Line Vasoactive infusions 10/11/23 0200  Site Assessment Clean, Dry, Intact 10/11/23 0200  Proximal Lumen Status Infusing 10/11/23 0200  Medial Lumen Status Infusing 10/11/23 0200  Distal Lumen Status Flushed;Saline locked 10/11/23 0200  Dressing Type Transparent 10/11/23 0200  Dressing Status Antimicrobial disc in place;Clean, Dry, Intact 10/11/23 0200  Line Care Connections checked and tightened 10/11/23 0200  Dressing Intervention New dressing;Antimicrobial disc changed 10/11/23 0200  Dressing Change Due 10/18/23 10/11/23 0200     Arterial Line 10/08/23 Left Radial (Active)  Site Assessment Clean, Dry, Intact 10/10/23 2000  Art Line Waveform Appropriate 10/10/23 2000  Art Line Interventions Zeroed and calibrated;Leveled;Connections checked and tightened 10/10/23 2000  Color/Movement/Sensation Capillary refill less than 3 sec 10/10/23 2000  Dressing Type Transparent 10/10/23 2000  Dressing Status Antimicrobial disc in place;Clean, Dry, Intact 10/10/23 2000  Dressing Change Due 10/15/23 10/10/23 2000     Negative Pressure Wound Therapy Abdomen Anterior;Medial (Active)  Last dressing change 10/10/23 10/10/23 1333  Site / Wound Assessment Dressing in place / Unable to assess 10/10/23 2000  Peri-wound Assessment Edema 10/10/23 2000  Cycle Continuous  10/10/23 2000  Target Pressure (mmHg) 125 10/10/23 2000  Canister Changed Yes 10/10/23 2300  Dressing Status Intact 10/10/23 2000  Drainage Amount Copious 10/10/23 2000  Drainage Description Sanguineous 10/10/23 2000  Output (mL) 500 mL 10/11/23 0600     Urethral Catheter Lane Temperature probe 16 Fr. (Active)  Indication for Insertion or Continuance of Catheter Bladder outlet obstruction / other urologic reason 10/10/23 2000  Site Assessment Swelling 10/10/23 2000  Catheter Maintenance Bag below level of bladder;Catheter secured;Drainage bag/tubing not touching floor;Insertion date on drainage bag;No dependent loops 10/10/23 2000  Collection Container Standard drainage bag 10/10/23 2000  Securement Method Adhesive securement device 10/10/23 2000  Output (mL) 375 mL 10/11/23 0600    Microbiology/Sepsis markers: Results for orders placed or performed during the hospital encounter of 10/08/23  MRSA Next Gen by PCR, Nasal     Status: None   Collection Time: 10/09/23  3:07 AM   Specimen: Nasal Mucosa; Nasal Swab  Result Value Ref Range Status   MRSA by PCR Next Gen NOT DETECTED NOT DETECTED Final    Comment: (NOTE) The GeneXpert MRSA Assay (FDA approved for NASAL specimens only), is one component of a comprehensive MRSA colonization surveillance program. It is not intended to diagnose MRSA infection nor to guide or monitor treatment for MRSA infections. Test performance is not FDA approved in patients less than 51 years old. Performed at [facility], [address], [place], [place] [address]     Anti-infectives:  Anti-infectives (From admission, onward)    Start     Dose/Rate Route Frequency Ordered Stop   10/11/23 0945  ceFEPIme (MAXIPIME) 2 g in sodium chloride 0.9 % 100 mL IVPB  2 g 200 mL/hr over 30 Minutes Intravenous Every 12 hours 10/11/23 0846       Consults: Treatment Team:  Despina Arias, MD Haddix, Gillie Manners, MD Victorino Sparrow, MD     Studies:    Events:  Subjective:    Overnight Issues: VAC output noted  Objective:  Vital signs for last 24 hours: Temp:  [97.7 F (36.5 C)-101.5 F (38.6 C)] 99.5 F (37.5 C) (11/14 0845) Pulse Rate:  [74-136] 94 (11/14 0845) Resp:  [0-29] 28 (11/14 0845) BP: (106-137)/(57-104) 125/63 (11/14 0800) SpO2:  [86 %-100 %] 100 % (11/14 0845) Arterial Line BP: (91-151)/(44-72) 101/54 (11/14 0845) FiO2 (%):  [60 %-100 %] 60 % (11/14 0757)  Hemodynamic parameters for last 24 hours:    Intake/Output from previous day: 11/13 0701 - 11/14 0700 In: 2345.5 [I.V.:1672.5; Blood:576; IV Piggyback:97] Out: 3430 [Urine:1120; Drains:2310]  Intake/Output this shift: No intake/output data recorded.  Vent settings for last 24 hours: Vent Mode: PRVC FiO2 (%):  [60 %-100 %] 60 % Set Rate:  [28 bmp] 28 bmp Vt Set:  [450 mL] 450 mL PEEP:  [10 cmH20] 10 cmH20 Plateau Pressure:  [24 cmH20-27 cmH20] 25 cmH20  Physical Exam:  General: on vent Neuro: sedated HEENT/Neck: ETT Resp: rhonchi bilaterally CVS: RRR GI: open abdomen VAC Extremities: traction RLE  Results for orders placed or performed during the hospital encounter of 10/08/23 (from the past 24 hour(s))  CBC     Status: Abnormal   Collection Time: 10/10/23  2:50 PM  Result Value Ref Range   WBC 5.8 4.0 - 10.5 K/uL   RBC 3.26 (L) 4.22 - 5.81 MIL/uL   Hemoglobin 9.2 (L) 13.0 - 17.0 g/dL   HCT 74.2 (L) 59.5 - 63.8 %   MCV 83.7 80.0 - 100.0 fL   MCH 28.2 26.0 - 34.0 pg   MCHC 33.7 30.0 - 36.0 g/dL   RDW 75.6 (H) 43.3 - 29.5 %   Platelets 40 (L) 150 - 400 K/uL   nRBC 0.3 (H) 0.0 - 0.2 %  I-STAT 7, (LYTES, BLD GAS, ICA, H+H)     Status: Abnormal   Collection Time: 10/11/23  3:38 AM  Result Value Ref Range   pH, Arterial 7.309 (L) 7.35 - 7.45   pCO2 arterial 55.8 (H) 32 - 48 mmHg   pO2, Arterial 56 (L) 83 - 108 mmHg   Bicarbonate 28.2 (H) 20.0 - 28.0 mmol/L   TCO2 30 22 - 32 mmol/L   O2 Saturation 86 %   Acid-Base  Excess 1.0 0.0 - 2.0 mmol/L   Sodium 141 135 - 145 mmol/L   Potassium 4.2 3.5 - 5.1 mmol/L   Calcium, Ion 0.96 (L) 1.15 - 1.40 mmol/L   HCT 22.0 (L) 39.0 - 52.0 %   Hemoglobin 7.5 (L) 13.0 - 17.0 g/dL   Patient temperature 18.8 C    Collection site art line    Drawn by RT    Sample type ARTERIAL   CBC     Status: Abnormal   Collection Time: 10/11/23  5:00 AM  Result Value Ref Range   WBC 6.2 4.0 - 10.5 K/uL   RBC 2.86 (L) 4.22 - 5.81 MIL/uL   Hemoglobin 8.1 (L) 13.0 - 17.0 g/dL   HCT 41.6 (L) 60.6 - 30.1 %   MCV 86.0 80.0 - 100.0 fL   MCH 28.3 26.0 - 34.0 pg   MCHC 32.9 30.0 - 36.0 g/dL   RDW 60.1 (H) 09.3 - 23.5 %  Platelets 52 (L) 150 - 400 K/uL   nRBC 0.5 (H) 0.0 - 0.2 %  Basic metabolic panel     Status: Abnormal   Collection Time: 10/11/23  5:00 AM  Result Value Ref Range   Sodium 138 135 - 145 mmol/L   Potassium 4.1 3.5 - 5.1 mmol/L   Chloride 104 98 - 111 mmol/L   CO2 27 22 - 32 mmol/L   Glucose, Bld 119 (H) 70 - 99 mg/dL   BUN 6 (L) 8 - 23 mg/dL   Creatinine, Ser 1.61 (H) 0.61 - 1.24 mg/dL   Calcium 6.6 (L) 8.9 - 10.3 mg/dL   GFR, Estimated >09 >60 mL/min   Anion gap 7 5 - 15  Triglycerides     Status: Abnormal   Collection Time: 10/11/23  5:51 AM  Result Value Ref Range   Triglycerides 821 (H) <150 mg/dL    Assessment & Plan: Present on Admission: **None**    LOS: 3 days   Additional comments:I reviewed the patient's new clinical lab test results. / PHBC  S/P ex lap, pelvic packing (5 laps), and temporary abdominal closure with vicryl mesh by Dr. Dossie Der 11/12 - return to OR 11/13 for ex lap, removal of packs and Abthera placement by Dr. Janee Morn - extensive mesenteric contusions but bowel viable Aortic transsection - Dr. Sherral Hammers did TEVAR 11/12 R acetabular FX, B superior and inferior rami FXs, R iliac FX, L sacral FX - Dr. Hulda Humphrey consulted, R femur traction pin apparatus needs to be adjusted Pelvic hemorrhage - packed as above, S/P IR embolization  by Dr. Elby Showers 11/12 Urology consulted (Dr. Roxy Horseman) - no bladder injury found, uncertain status of R ureter Acute hypoxic ventilator dependent respiratory failure - TRALI, 60% and PEEP 10, ABG at 1200 Right gluteal hematoma Right psoas hematoma Right 11th rib fracture Multiple lumbar vertebral body transverse process fractures Right scalp hematoma ABL anemia - Hb 8.1 ID - fever and rhonchi - start cefepime and send resp CX FEN - volume resuscitate with albumin VTE - no LMWH yet as PLTs under 100K and Hb not stable Dispo - ICU, likely back to OR tomorrow for [facility] change pending Ortho Trauma plan Critical Care Total Time*: 38 Minutes  Violeta Gelinas, MD, MPH, FACS Trauma & General Surgery Use [email or web address] to contact on call provider  10/11/2023  *Care during the described time interval was provided by me. I have reviewed this patient's available data, including medical history, events of note, physical examination and test results as part of my evaluation.

## 2023-10-11 NOTE — Addendum Note (Signed)
Addendum  created 10/11/23 1147 by Fabienne Bruns, RN   Attached Procedures edited

## 2023-10-12 ENCOUNTER — Encounter (HOSPITAL_COMMUNITY): Payer: Self-pay

## 2023-10-12 ENCOUNTER — Other Ambulatory Visit: Payer: Self-pay

## 2023-10-12 ENCOUNTER — Encounter (HOSPITAL_COMMUNITY): Admission: EM | Disposition: A | Discharge status: Skilled Nursing Facility | Payer: Self-pay | Source: Home / Self Care

## 2023-10-12 ENCOUNTER — Inpatient Hospital Stay (HOSPITAL_COMMUNITY): Payer: MEDICAID | Admitting: Anesthesiology

## 2023-10-12 ENCOUNTER — Inpatient Hospital Stay (HOSPITAL_COMMUNITY): Payer: MEDICAID

## 2023-10-12 DIAGNOSIS — S36899A Unspecified injury of other intra-abdominal organs, initial encounter: Secondary | ICD-10-CM

## 2023-10-12 HISTORY — PX: LAPAROTOMY: SHX154

## 2023-10-12 LAB — CBC
HCT: 24.6 % — ABNORMAL LOW (ref 39.0–52.0)
HCT: 24.8 % — ABNORMAL LOW (ref 39.0–52.0)
Hemoglobin: 7.9 g/dL — ABNORMAL LOW (ref 13.0–17.0)
Hemoglobin: 7.8 g/dL — ABNORMAL LOW (ref 13.0–17.0)
MCH: 27.4 pg (ref 26.0–34.0)
MCH: 27.1 pg (ref 26.0–34.0)
MCHC: 32.1 g/dL (ref 30.0–36.0)
MCHC: 31.5 g/dL (ref 30.0–36.0)
MCV: 85.4 fL (ref 80.0–100.0)
MCV: 86.1 fL (ref 80.0–100.0)
Platelets: 55 10*3/uL — ABNORMAL LOW (ref 150–400)
Platelets: 73 10*3/uL — ABNORMAL LOW (ref 150–400)
RBC: 2.88 MIL/uL — ABNORMAL LOW (ref 4.22–5.81)
RBC: 2.88 MIL/uL — ABNORMAL LOW (ref 4.22–5.81)
RDW: 20.1 % — ABNORMAL HIGH (ref 11.5–15.5)
RDW: 20 % — ABNORMAL HIGH (ref 11.5–15.5)
WBC: 5.4 10*3/uL (ref 4.0–10.5)
WBC: 6.1 10*3/uL (ref 4.0–10.5)
nRBC: 0.9 % — ABNORMAL HIGH (ref 0.0–0.2)
nRBC: 1.8 % — ABNORMAL HIGH (ref 0.0–0.2)

## 2023-10-12 LAB — POCT I-STAT 7, (LYTES, BLD GAS, ICA,H+H)
Acid-Base Excess: 1 mmol/L (ref 0.0–2.0)
Acid-Base Excess: 1 mmol/L (ref 0.0–2.0)
Bicarbonate: 28.3 mmol/L — ABNORMAL HIGH (ref 20.0–28.0)
Bicarbonate: 27.2 mmol/L (ref 20.0–28.0)
Calcium, Ion: 1.16 mmol/L (ref 1.15–1.40)
Calcium, Ion: 1.06 mmol/L — ABNORMAL LOW (ref 1.15–1.40)
HCT: 22 % — ABNORMAL LOW (ref 39.0–52.0)
HCT: 28 % — ABNORMAL LOW (ref 39.0–52.0)
Hemoglobin: 7.5 g/dL — ABNORMAL LOW (ref 13.0–17.0)
Hemoglobin: 9.5 g/dL — ABNORMAL LOW (ref 13.0–17.0)
O2 Saturation: 90 %
O2 Saturation: 98 %
Patient temperature: 36.7
Patient temperature: 99.3
Potassium: 4.8 mmol/L (ref 3.5–5.1)
Potassium: 4 mmol/L (ref 3.5–5.1)
Sodium: 144 mmol/L (ref 135–145)
Sodium: 145 mmol/L (ref 135–145)
TCO2: 30 mmol/L (ref 22–32)
TCO2: 29 mmol/L (ref 22–32)
pCO2 arterial: 59.6 mmHg — ABNORMAL HIGH (ref 32–48)
pCO2 arterial: 51.5 mmHg — ABNORMAL HIGH (ref 32–48)
pH, Arterial: 7.283 — ABNORMAL LOW (ref 7.35–7.45)
pH, Arterial: 7.333 — ABNORMAL LOW (ref 7.35–7.45)
pO2, Arterial: 67 mmHg — ABNORMAL LOW (ref 83–108)
pO2, Arterial: 113 mmHg — ABNORMAL HIGH (ref 83–108)

## 2023-10-12 LAB — TYPE AND SCREEN
ABO/RH(D): A POS
Antibody Screen: NEGATIVE
Unit division: 0

## 2023-10-12 LAB — BASIC METABOLIC PANEL
Anion gap: 7 (ref 5–15)
BUN: 8 mg/dL (ref 8–23)
CO2: 27 mmol/L (ref 22–32)
Calcium: 7.6 mg/dL — ABNORMAL LOW (ref 8.9–10.3)
Chloride: 109 mmol/L (ref 98–111)
Creatinine, Ser: 0.91 mg/dL (ref 0.61–1.24)
GFR, Estimated: >60 mL/min (ref >60)
Glucose, Bld: 106 mg/dL — ABNORMAL HIGH (ref 70–99)
Potassium: 4.1 mmol/L (ref 3.5–5.1)
Sodium: 143 mmol/L (ref 135–145)

## 2023-10-12 LAB — BPAM RBC
Blood Product Expiration Date: 202412082359
ISSUE DATE / TIME: 202411142233
Unit Type and Rh: 6200

## 2023-10-12 LAB — GLUCOSE, CAPILLARY
Glucose-Capillary: 115 mg/dL — ABNORMAL HIGH (ref 70–99)
Glucose-Capillary: 124 mg/dL — ABNORMAL HIGH (ref 70–99)
Glucose-Capillary: 104 mg/dL — ABNORMAL HIGH (ref 70–99)

## 2023-10-12 SURGERY — LAPAROTOMY, EXPLORATORY
Anesthesia: General

## 2023-10-12 MED ORDER — FENTANYL CITRATE (PF) 250 MCG/5ML IJ SOLN
INTRAMUSCULAR | Status: AC
  Filled 2023-10-12: qty 5

## 2023-10-12 MED ORDER — SODIUM CHLORIDE 0.9% FLUSH
10.0000 mL | INTRAVENOUS | Status: DC | PRN
  Administered 2023-10-15: 10 mL

## 2023-10-12 MED ORDER — SODIUM CHLORIDE 0.9% FLUSH
10.0000 mL | Freq: Two times a day (BID) | INTRAVENOUS | Status: DC
  Administered 2023-10-12 – 2023-10-26 (×25): 10 mL
  Administered 2023-10-26: 20 mL
  Administered 2023-10-27 – 2023-10-28 (×4): 10 mL
  Administered 2023-10-29: 20 mL
  Administered 2023-10-29 – 2023-11-05 (×14): 10 mL

## 2023-10-12 MED ORDER — MIDAZOLAM HCL 2 MG/2ML IJ SOLN
INTRAMUSCULAR | Status: AC
  Filled 2023-10-12: qty 2

## 2023-10-12 MED ORDER — 0.9 % SODIUM CHLORIDE (POUR BTL) OPTIME
TOPICAL | Status: DC | PRN
  Administered 2023-10-12 (×3): 1000 mL

## 2023-10-12 MED ORDER — PROPOFOL 10 MG/ML IV BOLUS
INTRAVENOUS | Status: AC
  Filled 2023-10-12: qty 20

## 2023-10-12 MED ORDER — THIAMINE HCL 100 MG/ML IJ SOLN
100.0000 mg | INTRAMUSCULAR | Status: DC
  Administered 2023-10-12 – 2023-10-15 (×4): 100 mg via INTRAVENOUS
  Filled 2023-10-12 (×4): qty 2

## 2023-10-12 MED ORDER — CLINIMIX E/DEXTROSE (8/10) 8 % IV SOLN
INTRAVENOUS | Status: AC
  Filled 2023-10-12 (×2): qty 1000

## 2023-10-12 MED ORDER — ADULT MULTIVITAMIN LIQUID CH
15.0000 mL | Freq: Every day | ORAL | Status: DC
  Administered 2023-10-12 – 2023-10-18 (×5): 15 mL
  Filled 2023-10-12 (×7): qty 15

## 2023-10-12 MED ORDER — SODIUM CHLORIDE 0.9 % IV SOLN: 1.0000 mg | Freq: Every day | INTRAVENOUS | Status: DC

## 2023-10-12 MED ORDER — INSULIN ASPART 100 UNIT/ML IJ SOLN
0.0000 [IU] | Freq: Four times a day (QID) | INTRAMUSCULAR | Status: DC
  Administered 2023-10-13 – 2023-10-14 (×4): 1 [IU] via SUBCUTANEOUS

## 2023-10-12 MED ORDER — SODIUM CHLORIDE 0.9 % IV SOLN
2.0000 g | Freq: Three times a day (TID) | INTRAVENOUS | Status: DC
  Administered 2023-10-12 – 2023-10-14 (×5): 2 g via INTRAVENOUS
  Filled 2023-10-12 (×5): qty 12.5

## 2023-10-12 MED ORDER — ROCURONIUM BROMIDE 10 MG/ML (PF) SYRINGE
PREFILLED_SYRINGE | INTRAVENOUS | Status: DC | PRN
  Administered 2023-10-12: 100 mg via INTRAVENOUS

## 2023-10-12 SURGICAL SUPPLY — 42 items
BAG COUNTER SPONGE SURGICOUNT (BAG) ×2 IMPLANT
BENZOIN TINCTURE PRP APPL 2/3 (GAUZE/BANDAGES/DRESSINGS) IMPLANT
BLADE CLIPPER SURG (BLADE) IMPLANT
CANISTER SUCT 3000ML PPV (MISCELLANEOUS) ×2 IMPLANT
CANISTER WOUND CARE 500ML ATS (WOUND CARE) IMPLANT
CHLORAPREP W/TINT 26 (MISCELLANEOUS) ×2 IMPLANT
COVER SURGICAL LIGHT HANDLE (MISCELLANEOUS) ×2 IMPLANT
DRAPE LAPAROSCOPIC ABDOMINAL (DRAPES) ×2 IMPLANT
DRAPE WARM FLUID 44X44 (DRAPES) ×2 IMPLANT
DRSG OPSITE POSTOP 4X10 (GAUZE/BANDAGES/DRESSINGS) IMPLANT
DRSG OPSITE POSTOP 4X8 (GAUZE/BANDAGES/DRESSINGS) IMPLANT
ELECT BLADE 6.5 EXT (BLADE) IMPLANT
ELECT CAUTERY BLADE 6.4 (BLADE) ×2 IMPLANT
ELECT REM PT RETURN 9FT ADLT (ELECTROSURGICAL) ×1
ELECTRODE REM PT RTRN 9FT ADLT (ELECTROSURGICAL) ×2 IMPLANT
GLOVE BIO SURGEON STRL SZ8 (GLOVE) ×2 IMPLANT
GLOVE BIOGEL PI IND STRL 8 (GLOVE) ×2 IMPLANT
GOWN STRL REUS W/ TWL LRG LVL3 (GOWN DISPOSABLE) ×2 IMPLANT
GOWN STRL REUS W/ TWL XL LVL3 (GOWN DISPOSABLE) ×2 IMPLANT
GOWN STRL REUS W/TWL LRG LVL3 (GOWN DISPOSABLE) ×1
GOWN STRL REUS W/TWL XL LVL3 (GOWN DISPOSABLE) ×1
HANDLE SUCTION POOLE (INSTRUMENTS) ×2 IMPLANT
KIT BASIN OR (CUSTOM PROCEDURE TRAY) ×2 IMPLANT
KIT TURNOVER KIT B (KITS) ×2 IMPLANT
LIGASURE IMPACT 36 18CM CVD LR (INSTRUMENTS) IMPLANT
MANIFOLD NEPTUNE II (INSTRUMENTS) IMPLANT
NS IRRIG 1000ML POUR BTL (IV SOLUTION) ×4 IMPLANT
PACK GENERAL/GYN (CUSTOM PROCEDURE TRAY) ×2 IMPLANT
PAD ARMBOARD 7.5X6 YLW CONV (MISCELLANEOUS) ×2 IMPLANT
SPECIMEN JAR LARGE (MISCELLANEOUS) IMPLANT
SPONGE ABD ABTHERA ADVANCE (MISCELLANEOUS) IMPLANT
SPONGE T-LAP 18X18 ~~LOC~~+RFID (SPONGE) IMPLANT
STAPLER VISISTAT 35W (STAPLE) ×2 IMPLANT
SUCTION POOLE HANDLE (INSTRUMENTS) ×1
SUT PDS AB 1 TP1 96 (SUTURE) ×4 IMPLANT
SUT SILK 2 0 SH CR/8 (SUTURE) ×2 IMPLANT
SUT SILK 2 0 TIES 10X30 (SUTURE) ×2 IMPLANT
SUT SILK 3 0 SH CR/8 (SUTURE) ×2 IMPLANT
SUT SILK 3 0 TIES 10X30 (SUTURE) ×2 IMPLANT
TOWEL GREEN STERILE (TOWEL DISPOSABLE) ×2 IMPLANT
TRAY FOLEY MTR SLVR 16FR STAT (SET/KITS/TRAYS/PACK) IMPLANT
YANKAUER SUCT BULB TIP NO VENT (SUCTIONS) IMPLANT
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
## 2023-10-12 NOTE — Plan of Care (Signed)
  Problem: Education: Goal: Knowledge of General Education information will improve Description: Including pain rating scale, medication(s)/side effects and non-pharmacologic comfort measures Outcome: Progressing   Problem: Clinical Measurements: Goal: Will remain free from infection Outcome: Progressing Goal: Respiratory complications will improve Outcome: Progressing   Problem: Nutrition: Goal: Adequate nutrition will be maintained Outcome: Progressing   Problem: Skin Integrity: Goal: Risk for impaired skin integrity will decrease Outcome: Progressing

## 2023-10-12 NOTE — Progress Notes (Addendum)
Patient ID: [name] [name], male   DOB: 1961/09/04, 62 y.o.   MRN: [id number] Follow up - Trauma Critical Care   Patient Details:    [name] [name] is an 62 y.o. male.  Lines/tubes : Airway 8 mm (Active)  Secured at (cm) 24 cm 10/12/23 0346  Measured From Lips 10/12/23 0346  Secured Location Center 10/12/23 0346  Secured By [employer] 10/12/23 0346  Tube Holder Repositioned Yes 10/12/23 0346  Prone position No 10/12/23 0346  Cuff Pressure (cm H2O) Green OR 18-26 CmH2O 10/11/23 2009  Site Condition Dry 10/12/23 0346     CVC Triple Lumen 10/08/23 Right Internal jugular (Active)  Indication for Insertion or Continuance of Line Vasoactive infusions 10/11/23 2100  Site Assessment Clean, Dry, Intact 10/11/23 2100  Proximal Lumen Status Infusing 10/11/23 2100  Medial Lumen Status Infusing 10/11/23 2100  Distal Lumen Status Flushed;Saline locked 10/11/23 2100  Dressing Type Transparent 10/11/23 2100  Dressing Status Antimicrobial disc in place 10/11/23 2100  Line Care Connections checked and tightened 10/11/23 2100  Dressing Intervention New dressing;Antimicrobial disc changed 10/11/23 0200  Dressing Change Due 10/18/23 10/11/23 2100     Arterial Line 10/08/23 Left Radial (Active)  Site Assessment Clean, Dry, Intact 10/11/23 2100  Line Status Pulsatile blood flow 10/11/23 2100  Art Line Waveform Appropriate 10/11/23 2100  Art Line Interventions Connections checked and tightened 10/11/23 2100  Color/Movement/Sensation Capillary refill less than 3 sec 10/11/23 2100  Dressing Type Transparent 10/11/23 2100  Dressing Status Antimicrobial disc in place 10/11/23 2100  Dressing Change Due 10/15/23 10/11/23 2100     Negative Pressure Wound Therapy Abdomen Anterior;Medial (Active)  Last dressing change 10/10/23 10/10/23 1333  Site / Wound Assessment Dressing in place / Unable to assess 10/11/23 2000  Peri-wound Assessment Edema 10/11/23 0800  Cycle Continuous;On 10/11/23 2000  Target  Pressure (mmHg) 125 10/11/23 2000  Canister Changed Yes 10/12/23 0200  Dressing Status Intact 10/11/23 2000  Drainage Amount Moderate 10/11/23 2000  Drainage Description Sanguineous 10/11/23 2000  Output (mL) 500 mL 10/12/23 0200     Urethral Catheter Lane Temperature probe 16 Fr. (Active)  Indication for Insertion or Continuance of Catheter Bladder outlet obstruction / other urologic reason 10/11/23 2000  Site Assessment Clean, Dry, Intact 10/11/23 2000  Catheter Maintenance Bag below level of bladder;Catheter secured;Drainage bag/tubing not touching floor;Insertion date on drainage bag;No dependent loops;Seal intact 10/11/23 2000  Collection Container Standard drainage bag 10/11/23 2000  Securement Method Adhesive securement device 10/11/23 2000  Output (mL) 130 mL 10/11/23 1800    Microbiology/Sepsis markers: Results for orders placed or performed during the hospital encounter of 10/08/23  MRSA Next Gen by PCR, Nasal     Status: None   Collection Time: 10/09/23  3:07 AM   Specimen: Nasal Mucosa; Nasal Swab  Result Value Ref Range Status   MRSA by PCR Next Gen NOT DETECTED NOT DETECTED Final    Comment: (NOTE) The GeneXpert MRSA Assay (FDA approved for NASAL specimens only), is one component of a comprehensive MRSA colonization surveillance program. It is not intended to diagnose MRSA infection nor to guide or monitor treatment for MRSA infections. Test performance is not FDA approved in patients less than 5 years old. Performed at [facility], [address] [place], [place] [address]   Culture, Respiratory w Gram Stain     Status: None (Preliminary result)   Collection Time: 10/11/23 10:09 AM   Specimen: Tracheal Aspirate; Respiratory  Result Value Ref Range Status  Specimen Description TRACHEAL ASPIRATE  Final   Special Requests NONE  Final   Gram Stain   Final    FEW WBC PRESENT, PREDOMINANTLY PMN NO ORGANISMS SEEN Performed at [facility], [address], [place], [place] [address]    Culture PENDING  Incomplete   Report Status PENDING  Incomplete    Anti-infectives:  Anti-infectives (From admission, onward)    Start     Dose/Rate Route Frequency Ordered Stop   10/11/23 0945  ceFEPIme (MAXIPIME) 2 g in sodium chloride 0.9 % 100 mL IVPB        2 g 200 mL/hr over 30 Minutes Intravenous Every 12 hours 10/11/23 0846         Consults: Treatment Team:  Despina Arias, MD Haddix, Gillie Manners, MD Victorino Sparrow, MD    Studies:    Events:  Subjective:    Overnight Issues:   Objective:  Vital signs for last 24 hours: Temp:  [97.9 F (36.6 C)-99.7 F (37.6 C)] 98.1 F (36.7 C) (11/15 0500) Pulse Rate:  [76-137] 76 (11/15 0500) Resp:  [0-29] 18 (11/15 0500) BP: (93-150)/(54-88) 130/82 (11/15 0500) SpO2:  [87 %-100 %] 99 % (11/15 0500) Arterial Line BP: (96-178)/(49-76) 137/76 (11/15 0500) FiO2 (%):  [60 %-70 %] 70 % (11/15 0523)  Hemodynamic parameters for last 24 hours:    Intake/Output from previous day: 11/14 0701 - 11/15 0700 In: 2136.5 [I.V.:825.5; Blood:380; IV Piggyback:931] Out: 1990 [Urine:490; Drains:1500]  Intake/Output this shift: Total I/O In: 895.3 [I.V.:415.3; Blood:380; IV Piggyback:100.1] Out: 500 [Drains:500]  Vent settings for last 24 hours: Vent Mode: PRVC FiO2 (%):  [60 %-70 %] 70 % Set Rate:  [28 bmp] 28 bmp Vt Set:  [450 mL] 450 mL PEEP:  [10 cmH20] 10 cmH20 Plateau Pressure:  [23 cmH20-28 cmH20] 23 cmH20  Physical Exam:  General: on vent Neuro: sedated HEENT/Neck: ETT Resp: few rhonchi CVS: RRR GI: open abd VAC Extremities: traction RLE  Results for orders placed or performed during the hospital encounter of 10/08/23 (from the past 24 hour(s))  Culture, Respiratory w Gram Stain     Status: None (Preliminary result)   Collection Time: 10/11/23 10:09 AM   Specimen: Tracheal Aspirate; Respiratory  Result Value Ref Range   Specimen Description TRACHEAL ASPIRATE    Special  Requests NONE    Gram Stain      FEW WBC PRESENT, PREDOMINANTLY PMN NO ORGANISMS SEEN Performed at [facility], [address] [place], [place] [address]    Culture PENDING    Report Status PENDING   I-STAT 7, (LYTES, BLD GAS, ICA, H+H)     Status: Abnormal   Collection Time: 10/11/23 10:15 AM  Result Value Ref Range   pH, Arterial 7.268 (L) 7.35 - 7.45   pCO2 arterial 65.7 (HH) 32 - 48 mmHg   pO2, Arterial 62 (L) 83 - 108 mmHg   Bicarbonate 29.9 (H) 20.0 - 28.0 mmol/L   TCO2 32 22 - 32 mmol/L   O2 Saturation 86 %   Acid-Base Excess 2.0 0.0 - 2.0 mmol/L   Sodium 142 135 - 145 mmol/L   Potassium 4.5 3.5 - 5.1 mmol/L   Calcium, Ion 1.00 (L) 1.15 - 1.40 mmol/L   HCT 23.0 (L) 39.0 - 52.0 %   Hemoglobin 7.8 (L) 13.0 - 17.0 g/dL   Patient temperature 32.2 F    Collection site Web designer by RT    Sample type  ARTERIAL    Comment NOTIFIED PHYSICIAN   Type and screen [facility]     Status: None (Preliminary result)   Collection Time: 10/11/23  8:45 PM  Result Value Ref Range   ABO/RH(D) A POS    Antibody Screen NEG    Sample Expiration 10/14/2023,2359    Unit Number [id number]    Blood Component Type RED CELLS,LR    Unit division 00    Status of Unit ISSUED    Transfusion Status OK TO TRANSFUSE    Crossmatch Result      Compatible Performed at [facility], [address] [place], [place] [address]   CBC     Status: Abnormal   Collection Time: 10/11/23  8:48 PM  Result Value Ref Range   WBC 3.9 (L) 4.0 - 10.5 K/uL   RBC 2.37 (L) 4.22 - 5.81 MIL/uL   Hemoglobin 6.6 (LL) 13.0 - 17.0 g/dL   HCT 78.2 (L) 95.6 - 21.3 %   MCV 87.3 80.0 - 100.0 fL   MCH 27.8 26.0 - 34.0 pg   MCHC 31.9 30.0 - 36.0 g/dL   RDW 08.6 (H) 57.8 - 46.9 %   Platelets 44 (L) 150 - 400 K/uL   nRBC 1.0 (H) 0.0 - 0.2 %  Prepare RBC (crossmatch)     Status: None   Collection Time: 10/11/23  9:51 PM  Result Value Ref Range   Order Confirmation      ORDER  PROCESSED BY BLOOD BANK Performed at [facility], [address], [place], [place] [address]   CBC     Status: Abnormal   Collection Time: 10/12/23  5:02 AM  Result Value Ref Range   WBC 5.4 4.0 - 10.5 K/uL   RBC 2.88 (L) 4.22 - 5.81 MIL/uL   Hemoglobin 7.9 (L) 13.0 - 17.0 g/dL   HCT 84.1 (L) 32.4 - 40.1 %   MCV 85.4 80.0 - 100.0 fL   MCH 27.4 26.0 - 34.0 pg   MCHC 32.1 30.0 - 36.0 g/dL   RDW 02.7 (H) 25.3 - 66.4 %   Platelets 55 (L) 150 - 400 K/uL   nRBC 0.9 (H) 0.0 - 0.2 %  Basic metabolic panel     Status: Abnormal   Collection Time: 10/12/23  5:02 AM  Result Value Ref Range   Sodium 143 135 - 145 mmol/L   Potassium 4.1 3.5 - 5.1 mmol/L   Chloride 109 98 - 111 mmol/L   CO2 27 22 - 32 mmol/L   Glucose, Bld 106 (H) 70 - 99 mg/dL   BUN 8 8 - 23 mg/dL   Creatinine, Ser 4.03 0.61 - 1.24 mg/dL   Calcium 7.6 (L) 8.9 - 10.3 mg/dL   GFR, Estimated >47 >42 mL/min   Anion gap 7 5 - 15  I-STAT 7, (LYTES, BLD GAS, ICA, H+H)     Status: Abnormal   Collection Time: 10/12/23  5:17 AM  Result Value Ref Range   pH, Arterial 7.283 (L) 7.35 - 7.45   pCO2 arterial 59.6 (H) 32 - 48 mmHg   pO2, Arterial 67 (L) 83 - 108 mmHg   Bicarbonate 28.3 (H) 20.0 - 28.0 mmol/L   TCO2 30 22 - 32 mmol/L   O2 Saturation 90 %   Acid-Base Excess 1.0 0.0 - 2.0 mmol/L   Sodium 144 135 - 145 mmol/L   Potassium 4.8 3.5 - 5.1 mmol/L   Calcium, Ion 1.16 1.15 - 1.40 mmol/L  HCT 22.0 (L) 39.0 - 52.0 %   Hemoglobin 7.5 (L) 13.0 - 17.0 g/dL   Patient temperature 78.2 C    Collection site RADIAL, ALLEN'S TEST ACCEPTABLE    Drawn by RT    Sample type ARTERIAL     Assessment & Plan: Present on Admission: **None**    LOS: 4 days   Additional comments:I reviewed the patient's new clinical lab test results. / PHBC  S/P ex lap, pelvic packing (5 laps), and temporary abdominal closure with vicryl mesh by Dr. Dossie Der 11/12 - return to OR 11/13 for ex lap, removal of packs and Abthera placement by  Dr. Janee Morn - extensive mesenteric contusions but bowel viable, to OR today Aortic transsection - Dr. Sherral Hammers did TEVAR 11/12 R acetabular FX, B superior and inferior rami FXs, R iliac FX, L sacral FX - Dr. Hulda Humphrey consulted, R femur traction pin apparatus needs to be adjusted Pelvic hemorrhage - packed as above, S/P IR embolization by Dr. Elby Showers 11/12 Urology consulted (Dr. Roxy Horseman) - no bladder injury found, uncertain status of R ureter Acute hypoxic ventilator dependent respiratory failure - TRALI, 70% and PEEP 10, F/U ABG this PM Right gluteal hematoma Right psoas hematoma Right 11th rib fracture Multiple lumbar vertebral body transverse process fractures Right scalp hematoma ABL anemia - 1u PRBC overnight. Hb 7.9 ID - fever and rhonchi - started cefepime 11/14, WBC WNL and afeb now, resp CX P FEN - volume resuscitate with albumin VTE - no LMWH yet as PLTs under 100K and Hb not stable Dispo - ICU, to OR for ex lap, likely VAC change. Procedure, risks, benefits D/W his sister and consent documented 11/14. Critical Care Total Time*: 32 Minutes  Violeta Gelinas, MD, MPH, FACS Trauma & General Surgery Use [email or web address] to contact on call provider  10/12/2023  *Care during the described time interval was provided by me. I have reviewed this patient's available data, including medical history, events of note, physical examination and test results as part of my evaluation.
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
## 2023-10-12 NOTE — Progress Notes (Signed)
Pt back on unit around 0855.

## 2023-10-12 NOTE — Progress Notes (Signed)
Telephone consent for PICC obtained from sister Juliette Mangle.

## 2023-10-12 NOTE — Progress Notes (Signed)
Pt off to O.R. at 0730. Pt in no new distress. Pt transported along w/ CRN and O.R. nurse.

## 2023-10-12 NOTE — Anesthesia Postprocedure Evaluation (Signed)
Anesthesia Post Note  Patient: [name] [name]  Procedure(s) Performed: EXPLORATORY LAPAROTOMY WITH VAC CHANGE     Patient location during evaluation: SICU Anesthesia Type: General Level of consciousness: sedated Pain management: pain level controlled Vital Signs Assessment: post-procedure vital signs reviewed and stable Respiratory status: patient remains intubated per anesthesia plan Cardiovascular status: stable Postop Assessment: no apparent nausea or vomiting Anesthetic complications: no  No notable events documented.  Last Vitals:  Vitals:   10/12/23 0715 10/12/23 0730  BP: 134/84   Pulse: (!) 115 94  Resp: (!) 28 (!) 9  Temp: 36.8 C 36.8 C  SpO2: 95%     Last Pain:  Vitals:   10/12/23 0400  TempSrc: Bladder                 Shelton Silvas

## 2023-10-12 NOTE — Addendum Note (Signed)
Addendum  created 10/12/23 0956 by Shary Decamp, CRNA   Clinical Note Signed, Intraprocedure Blocks edited, SmartForm saved

## 2023-10-12 NOTE — Progress Notes (Signed)
Pt w/ a decrease in core temp via bladder temp s/p O.R. visit. Dr. Janee Morn aware and ok to place Bair-Hugger.

## 2023-10-12 NOTE — Progress Notes (Signed)
Peripherally Inserted Central Catheter Placement  The IV Nurse has discussed with the patient and/or persons authorized to consent for the patient, the purpose of this procedure and the potential benefits and risks involved with this procedure.  The benefits include less needle sticks, lab draws from the catheter, and the patient may be discharged home with the catheter. Risks include, but not limited to, infection, bleeding, blood clot (thrombus formation), and puncture of an artery; nerve damage and irregular heartbeat and possibility to perform a PICC exchange if needed/ordered by physician.  Alternatives to this procedure were also discussed.  Bard Power PICC patient education guide, fact sheet on infection prevention and patient information card has been provided to patient /or left at bedside.  Telephone consent obtained from sister due to altered mental status.  PICC Placement Documentation  PICC Triple Lumen 10/12/23 Right Brachial 39 cm 0 cm (Active)  Indication for Insertion or Continuance of Line Administration of hyperosmolar/irritating solutions (i.e. TPN, Vancomycin, etc.) 10/12/23 1329  Exposed Catheter (cm) 0 cm 10/12/23 1329  Site Assessment Clean, Dry, Intact 10/12/23 1329  Lumen #1 Status Flushed;Saline locked;Blood return noted 10/12/23 1329  Lumen #2 Status Flushed;Saline locked;Blood return noted 10/12/23 1329  Lumen #3 Status Flushed;Saline locked;Blood return noted 10/12/23 1329  Dressing Type Transparent;Securing device 10/12/23 1329  Dressing Status Antimicrobial disc in place;Clean, Dry, Intact 10/12/23 1329  Line Care Connections checked and tightened 10/12/23 1329  Line Adjustment (NICU/IV Team Only) No 10/12/23 1329  Dressing Intervention New dressing;Adhesive placed at insertion site (IV team only) 10/12/23 1329  Dressing Change Due 10/19/23 10/12/23 1329       [name] [name], [name] [name] 10/12/2023, 1:31 PM
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
## 2023-10-12 NOTE — Progress Notes (Signed)
Pt's temp increased while bair-hugger in place. Device removed. Delay in temp control management w/ device d/t other interventions in another pt's room. Will conti monitor pt's temp etc.

## 2023-10-12 NOTE — Progress Notes (Signed)
Iantha Fallen, RN made aware that PICC is ready to use.  Instructed to remove all unnecessary PIVs and to change all tubings including the lines already being used on internal jugular CVL.

## 2023-10-12 NOTE — Anesthesia Procedure Notes (Addendum)
Procedure Name: Intubation Date/Time: 10/12/2023 7:31 AM  Performed by: Shary Decamp, CRNAPre-anesthesia Checklist: Patient identified, Patient being monitored, Timeout performed, Emergency Drugs available and Suction available Patient Re-evaluated:Patient Re-evaluated prior to induction Oxygen Delivery Method: Circle system utilized Preoxygenation: Pre-oxygenation with 100% oxygen Induction Type: Inhalational induction with existing ETT Tube type: Oral Tube size: 8.0 mm Comments: Patient was already intubated with 8.0 ETT and secured in place

## 2023-10-12 NOTE — Progress Notes (Signed)
All IV lines changed; those going to pt's newly placed PICC.
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
## 2023-10-12 NOTE — Anesthesia Preprocedure Evaluation (Addendum)
Anesthesia Evaluation  Patient identified by MRN, date of birth, ID band Patient unresponsive  General Assessment Comment:Pt intubated in ED  Reviewed: Allergy & Precautions, Patient's Chart, lab work & pertinent test results, Unable to perform ROS - Chart review only  History of Anesthesia Complications Negative for: history of anesthetic complications  Airway Mallampati: Intubated      Comment: C-collar Dental no notable dental hx.    Pulmonary  Intubated and sedated   Pulmonary exam normal        Cardiovascular Normal cardiovascular exam  Unstable: 8u PRBC, 6u FFP in ED   Neuro/Psych    GI/Hepatic For emergency ex lap   Endo/Other    Renal/GU    Ruptured bladder    Musculoskeletal   Abdominal   Peds  Hematology   Anesthesia Other Findings   Reproductive/Obstetrics                             Anesthesia Physical Anesthesia Plan  ASA: 4  Anesthesia Plan: General   Post-op Pain Management:    Induction: Inhalational  PONV Risk Score and Plan: 2 and Treatment may vary due to age or medical condition  Airway Management Planned: Oral ETT  Additional Equipment: Arterial line  Intra-op Plan:   Post-operative Plan: Possible Post-op intubation/ventilation  Informed Consent:      History available from chart only  Plan Discussed with: CRNA  Anesthesia Plan Comments:         Anesthesia Quick Evaluation

## 2023-10-12 NOTE — Progress Notes (Signed)
"  Ok to pull pt's CVC", per Dr. Azucena Cecil.

## 2023-10-12 NOTE — Transfer of Care (Signed)
Immediate Anesthesia Transfer of Care Note  Patient: [name] [name]  Procedure(s) Performed: EXPLORATORY LAPAROTOMY WITH VAC CHANGE  Patient Location: ICU  Anesthesia Type:General  Level of Consciousness: Patient remains intubated per anesthesia plan  Airway & Oxygen Therapy: Patient placed on Ventilator (see vital sign flow sheet for setting)  Post-op Assessment: Report given to RN and Post -op Vital signs reviewed and stable  Post vital signs: Reviewed and stable  Last Vitals:  Vitals Value Taken Time  BP    Temp    Pulse 77 10/12/23 0833  Resp 19 10/12/23 0835  SpO2    Vitals shown include unfiled device data.  Last Pain:  Vitals:   10/12/23 0400  TempSrc: Bladder         Complications: No notable events documented.

## 2023-10-13 LAB — POCT I-STAT 7, (LYTES, BLD GAS, ICA,H+H)
Acid-Base Excess: 4 mmol/L — ABNORMAL HIGH (ref 0.0–2.0)
Bicarbonate: 29.1 mmol/L — ABNORMAL HIGH (ref 20.0–28.0)
Calcium, Ion: 1.1 mmol/L — ABNORMAL LOW (ref 1.15–1.40)
HCT: 22 % — ABNORMAL LOW (ref 39.0–52.0)
Hemoglobin: 7.5 g/dL — ABNORMAL LOW (ref 13.0–17.0)
O2 Saturation: 100 %
Patient temperature: 37.6
Potassium: 3.9 mmol/L (ref 3.5–5.1)
Sodium: 145 mmol/L (ref 135–145)
TCO2: 30 mmol/L (ref 22–32)
pCO2 arterial: 46.2 mmHg (ref 32–48)
pH, Arterial: 7.41 (ref 7.35–7.45)
pO2, Arterial: 223 mmHg — ABNORMAL HIGH (ref 83–108)

## 2023-10-13 LAB — COMPREHENSIVE METABOLIC PANEL
ALT: 64 U/L — ABNORMAL HIGH (ref 0–44)
AST: 256 U/L — ABNORMAL HIGH (ref 15–41)
Albumin: 2.4 g/dL — ABNORMAL LOW (ref 3.5–5.0)
Alkaline Phosphatase: 34 U/L — ABNORMAL LOW (ref 38–126)
Anion gap: 5 (ref 5–15)
BUN: 14 mg/dL (ref 8–23)
CO2: 28 mmol/L (ref 22–32)
Calcium: 7.6 mg/dL — ABNORMAL LOW (ref 8.9–10.3)
Chloride: 111 mmol/L (ref 98–111)
Creatinine, Ser: 1.01 mg/dL (ref 0.61–1.24)
GFR, Estimated: >60 mL/min (ref >60)
Glucose, Bld: 138 mg/dL — ABNORMAL HIGH (ref 70–99)
Potassium: 4.1 mmol/L (ref 3.5–5.1)
Sodium: 144 mmol/L (ref 135–145)
Total Bilirubin: 5.5 mg/dL — ABNORMAL HIGH (ref <1.2)
Total Protein: 4.9 g/dL — ABNORMAL LOW (ref 6.5–8.1)

## 2023-10-13 LAB — GLUCOSE, CAPILLARY
Glucose-Capillary: 123 mg/dL — ABNORMAL HIGH (ref 70–99)
Glucose-Capillary: 122 mg/dL — ABNORMAL HIGH (ref 70–99)
Glucose-Capillary: 126 mg/dL — ABNORMAL HIGH (ref 70–99)
Glucose-Capillary: 119 mg/dL — ABNORMAL HIGH (ref 70–99)

## 2023-10-13 LAB — CBC
HCT: 24.7 % — ABNORMAL LOW (ref 39.0–52.0)
Hemoglobin: 7.6 g/dL — ABNORMAL LOW (ref 13.0–17.0)
MCH: 27.1 pg (ref 26.0–34.0)
MCHC: 30.8 g/dL (ref 30.0–36.0)
MCV: 88.2 fL (ref 80.0–100.0)
Platelets: 75 10*3/uL — ABNORMAL LOW (ref 150–400)
RBC: 2.8 MIL/uL — ABNORMAL LOW (ref 4.22–5.81)
RDW: 19.9 % — ABNORMAL HIGH (ref 11.5–15.5)
WBC: 5.8 10*3/uL (ref 4.0–10.5)
nRBC: 2.6 % — ABNORMAL HIGH (ref 0.0–0.2)

## 2023-10-13 LAB — PHOSPHORUS: Phosphorus: 2.3 mg/dL — ABNORMAL LOW (ref 2.5–4.6)

## 2023-10-13 LAB — TRIGLYCERIDES: Triglycerides: 304 mg/dL — ABNORMAL HIGH (ref <150)

## 2023-10-13 LAB — MAGNESIUM: Magnesium: 1.8 mg/dL (ref 1.7–2.4)

## 2023-10-13 MED ORDER — CHLORHEXIDINE GLUCONATE CLOTH 2 % EX PADS
6.0000 | MEDICATED_PAD | Freq: Every day | CUTANEOUS | Status: DC
  Administered 2023-10-13 – 2023-10-25 (×13): 6 via TOPICAL

## 2023-10-13 MED ORDER — DEXTROSE 5 % IV SOLN
20.0000 mmol | Freq: Once | INTRAVENOUS | Status: AC
  Administered 2023-10-13: 20 mmol via INTRAVENOUS
  Filled 2023-10-13: qty 6.67

## 2023-10-13 MED ORDER — METHOCARBAMOL 500 MG PO TABS
1000.0000 mg | ORAL_TABLET | Freq: Three times a day (TID) | ORAL | Status: DC
  Administered 2023-10-13 – 2023-11-05 (×64): 1000 mg
  Filled 2023-10-13 (×65): qty 2

## 2023-10-13 MED ORDER — TRACE MINERALS CU-MN-SE-ZN 300-55-60-3000 MCG/ML IV SOLN
INTRAVENOUS | Status: AC
  Filled 2023-10-13 (×2): qty 2000

## 2023-10-13 MED ORDER — FUROSEMIDE 10 MG/ML IJ SOLN
40.0000 mg | Freq: Once | INTRAMUSCULAR | Status: AC
  Administered 2023-10-13: 40 mg via INTRAVENOUS
  Filled 2023-10-13: qty 4

## 2023-10-13 MED ORDER — CALCIUM GLUCONATE-NACL 2-0.675 GM/100ML-% IV SOLN
2.0000 g | Freq: Once | INTRAVENOUS | Status: AC
  Administered 2023-10-13: 2000 mg via INTRAVENOUS
  Filled 2023-10-13: qty 100

## 2023-10-13 MED ORDER — ACETAMINOPHEN 500 MG PO TABS
1000.0000 mg | ORAL_TABLET | Freq: Four times a day (QID) | ORAL | Status: DC
  Administered 2023-10-13 – 2023-10-20 (×24): 1000 mg
  Filled 2023-10-13 (×26): qty 2

## 2023-10-13 MED ORDER — OXYCODONE HCL 5 MG PO TABS
5.0000 mg | ORAL_TABLET | ORAL | Status: DC | PRN
  Administered 2023-10-13: 5 mg
  Administered 2023-10-13 – 2023-10-19 (×9): 10 mg
  Administered 2023-10-20 (×2): 5 mg
  Administered 2023-10-20: 10 mg
  Administered 2023-10-21: 5 mg
  Administered 2023-10-21 – 2023-10-22 (×3): 10 mg
  Administered 2023-10-23: 5 mg
  Administered 2023-10-23: 10 mg
  Administered 2023-10-23: 5 mg
  Administered 2023-10-24 – 2023-10-25 (×7): 10 mg
  Administered 2023-10-25: 5 mg
  Administered 2023-10-25 – 2023-10-26 (×6): 10 mg
  Administered 2023-10-27 – 2023-10-28 (×2): 5 mg
  Administered 2023-10-29 (×2): 10 mg
  Administered 2023-10-29: 5 mg
  Administered 2023-10-30 – 2023-11-02 (×10): 10 mg
  Administered 2023-11-02 (×2): 5 mg
  Administered 2023-11-03: 10 mg
  Administered 2023-11-03: 5 mg
  Administered 2023-11-03 – 2023-11-05 (×3): 10 mg
  Filled 2023-10-13 (×19): qty 2
  Filled 2023-10-13: qty 1
  Filled 2023-10-13 (×15): qty 2
  Filled 2023-10-13: qty 1
  Filled 2023-10-13 (×21): qty 2

## 2023-10-13 MED ORDER — MAGNESIUM SULFATE 2 GM/50ML IV SOLN
2.0000 g | Freq: Once | INTRAVENOUS | Status: AC
  Administered 2023-10-13: 2 g via INTRAVENOUS
  Filled 2023-10-13: qty 50
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
## 2023-10-14 LAB — CBC
HCT: 23.5 % — ABNORMAL LOW (ref 39.0–52.0)
Hemoglobin: 7.2 g/dL — ABNORMAL LOW (ref 13.0–17.0)
MCH: 27.8 pg (ref 26.0–34.0)
MCHC: 30.6 g/dL (ref 30.0–36.0)
MCV: 90.7 fL (ref 80.0–100.0)
Platelets: 96 10*3/uL — ABNORMAL LOW (ref 150–400)
RBC: 2.59 MIL/uL — ABNORMAL LOW (ref 4.22–5.81)
RDW: 19.5 % — ABNORMAL HIGH (ref 11.5–15.5)
WBC: 5.2 10*3/uL (ref 4.0–10.5)
nRBC: 2.3 % — ABNORMAL HIGH (ref 0.0–0.2)

## 2023-10-14 LAB — CULTURE, RESPIRATORY W GRAM STAIN: Culture: NORMAL

## 2023-10-14 LAB — COMPREHENSIVE METABOLIC PANEL
ALT: 49 U/L — ABNORMAL HIGH (ref 0–44)
AST: 173 U/L — ABNORMAL HIGH (ref 15–41)
Albumin: 1.8 g/dL — ABNORMAL LOW (ref 3.5–5.0)
Alkaline Phosphatase: 34 U/L — ABNORMAL LOW (ref 38–126)
Anion gap: 4 — ABNORMAL LOW (ref 5–15)
BUN: 19 mg/dL (ref 8–23)
CO2: 23 mmol/L (ref 22–32)
Calcium: 6.6 mg/dL — ABNORMAL LOW (ref 8.9–10.3)
Chloride: 116 mmol/L — ABNORMAL HIGH (ref 98–111)
Creatinine, Ser: 0.71 mg/dL (ref 0.61–1.24)
GFR, Estimated: >60 mL/min (ref >60)
Glucose, Bld: 128 mg/dL — ABNORMAL HIGH (ref 70–99)
Potassium: 3 mmol/L — ABNORMAL LOW (ref 3.5–5.1)
Sodium: 143 mmol/L (ref 135–145)
Total Bilirubin: 4.2 mg/dL — ABNORMAL HIGH (ref <1.2)
Total Protein: 4 g/dL — ABNORMAL LOW (ref 6.5–8.1)

## 2023-10-14 LAB — MAGNESIUM: Magnesium: 1.8 mg/dL (ref 1.7–2.4)

## 2023-10-14 LAB — GLUCOSE, CAPILLARY
Glucose-Capillary: 116 mg/dL — ABNORMAL HIGH (ref 70–99)
Glucose-Capillary: 133 mg/dL — ABNORMAL HIGH (ref 70–99)
Glucose-Capillary: 95 mg/dL (ref 70–99)
Glucose-Capillary: 160 mg/dL — ABNORMAL HIGH (ref 70–99)

## 2023-10-14 LAB — PHOSPHORUS: Phosphorus: 1.9 mg/dL — ABNORMAL LOW (ref 2.5–4.6)

## 2023-10-14 MED ORDER — FAT EMUL FISH OIL/PLANT BASED 20% (SMOFLIPID)IV EMUL
250.0000 mL | INTRAVENOUS | Status: AC
  Administered 2023-10-14: 250 mL via INTRAVENOUS
  Filled 2023-10-14: qty 250

## 2023-10-14 MED ORDER — SODIUM CHLORIDE 0.9 % IV SOLN
4.0000 g | Freq: Once | INTRAVENOUS | Status: AC
  Administered 2023-10-14: 4 g via INTRAVENOUS
  Filled 2023-10-14: qty 40

## 2023-10-14 MED ORDER — INSULIN ASPART 100 UNIT/ML IJ SOLN
0.0000 [IU] | INTRAMUSCULAR | Status: DC
  Administered 2023-10-14 – 2023-10-16 (×13): 2 [IU] via SUBCUTANEOUS
  Administered 2023-10-17: 3 [IU] via SUBCUTANEOUS
  Administered 2023-10-17 – 2023-10-18 (×8): 2 [IU] via SUBCUTANEOUS
  Administered 2023-10-18 (×2): 1 [IU] via SUBCUTANEOUS
  Administered 2023-10-18: 2 [IU] via SUBCUTANEOUS
  Administered 2023-10-19 (×3): 1 [IU] via SUBCUTANEOUS
  Administered 2023-10-19: 2 [IU] via SUBCUTANEOUS
  Administered 2023-10-20: 1 [IU] via SUBCUTANEOUS
  Administered 2023-10-20 (×2): 2 [IU] via SUBCUTANEOUS
  Administered 2023-10-20: 3 [IU] via SUBCUTANEOUS
  Administered 2023-10-20 – 2023-10-21 (×3): 2 [IU] via SUBCUTANEOUS
  Administered 2023-10-21: 1 [IU] via SUBCUTANEOUS
  Administered 2023-10-21: 3 [IU] via SUBCUTANEOUS
  Administered 2023-10-21: 2 [IU] via SUBCUTANEOUS
  Administered 2023-10-22: 3 [IU] via SUBCUTANEOUS
  Administered 2023-10-22: 2 [IU] via SUBCUTANEOUS
  Administered 2023-10-22: 1 [IU] via SUBCUTANEOUS
  Administered 2023-10-22: 3 [IU] via SUBCUTANEOUS
  Administered 2023-10-23: 2 [IU] via SUBCUTANEOUS
  Administered 2023-10-23: 1 [IU] via SUBCUTANEOUS
  Administered 2023-10-23: 3 [IU] via SUBCUTANEOUS
  Administered 2023-10-24 – 2023-10-25 (×3): 1 [IU] via SUBCUTANEOUS
  Administered 2023-10-25: 2 [IU] via SUBCUTANEOUS
  Administered 2023-10-25 – 2023-10-26 (×3): 1 [IU] via SUBCUTANEOUS
  Administered 2023-10-26: 2 [IU] via SUBCUTANEOUS
  Administered 2023-10-26 – 2023-10-27 (×3): 1 [IU] via SUBCUTANEOUS
  Administered 2023-10-27 (×2): 2 [IU] via SUBCUTANEOUS
  Administered 2023-10-27: 1 [IU] via SUBCUTANEOUS
  Administered 2023-10-28: 2 [IU] via SUBCUTANEOUS
  Administered 2023-10-28 (×2): 1 [IU] via SUBCUTANEOUS
  Administered 2023-10-28 (×2): 2 [IU] via SUBCUTANEOUS
  Administered 2023-10-28 – 2023-10-30 (×6): 1 [IU] via SUBCUTANEOUS
  Administered 2023-10-30 (×2): 2 [IU] via SUBCUTANEOUS
  Administered 2023-10-30: 1 [IU] via SUBCUTANEOUS
  Administered 2023-10-30: 2 [IU] via SUBCUTANEOUS
  Administered 2023-10-31 – 2023-11-01 (×6): 1 [IU] via SUBCUTANEOUS
  Administered 2023-11-01: 2 [IU] via SUBCUTANEOUS
  Administered 2023-11-01 – 2023-11-02 (×4): 1 [IU] via SUBCUTANEOUS
  Administered 2023-11-02: 2 [IU] via SUBCUTANEOUS
  Administered 2023-11-02 – 2023-11-04 (×4): 1 [IU] via SUBCUTANEOUS
  Administered 2023-11-04 – 2023-11-05 (×3): 2 [IU] via SUBCUTANEOUS
  Administered 2023-11-06: 1 [IU] via SUBCUTANEOUS
  Administered 2023-11-06: 2 [IU] via SUBCUTANEOUS

## 2023-10-14 MED ORDER — POTASSIUM CHLORIDE 20 MEQ PO PACK
40.0000 meq | PACK | Freq: Two times a day (BID) | ORAL | Status: AC
  Administered 2023-10-14 (×2): 40 meq
  Filled 2023-10-14 (×2): qty 2

## 2023-10-14 MED ORDER — POTASSIUM PHOSPHATES 15 MMOLE/5ML IV SOLN
45.0000 mmol | Freq: Once | INTRAVENOUS | Status: AC
  Administered 2023-10-14: 45 mmol via INTRAVENOUS
  Filled 2023-10-14: qty 15

## 2023-10-14 MED ORDER — PIVOT 1.5 CAL PO LIQD
1000.0000 mL | ORAL | Status: DC
  Administered 2023-10-14 – 2023-10-16 (×4): 1000 mL

## 2023-10-14 MED ORDER — SODIUM CHLORIDE 3 % IN NEBU
4.0000 mL | INHALATION_SOLUTION | Freq: Two times a day (BID) | RESPIRATORY_TRACT | Status: AC
  Administered 2023-10-14 – 2023-10-16 (×6): 4 mL via RESPIRATORY_TRACT
  Filled 2023-10-14 (×6): qty 4

## 2023-10-14 MED ORDER — MAGNESIUM SULFATE 4 GM/100ML IV SOLN
4.0000 g | Freq: Once | INTRAVENOUS | Status: AC
  Administered 2023-10-14: 4 g via INTRAVENOUS
  Filled 2023-10-14: qty 100

## 2023-10-14 MED ORDER — TRACE MINERALS CU-MN-SE-ZN 300-55-60-3000 MCG/ML IV SOLN
INTRAVENOUS | Status: AC
  Filled 2023-10-14 (×2): qty 2000

## 2023-10-14 MED ORDER — FUROSEMIDE 10 MG/ML IJ SOLN
60.0000 mg | Freq: Four times a day (QID) | INTRAMUSCULAR | Status: AC
Start: 2023-10-14 — End: 2023-10-14
  Administered 2023-10-14 (×2): 60 mg via INTRAVENOUS
  Filled 2023-10-14 (×2): qty 6

## 2023-10-14 NOTE — Plan of Care (Signed)
  Problem: Education: Goal: Knowledge of General Education information will improve Description: Including pain rating scale, medication(s)/side effects and non-pharmacologic comfort measures Outcome: Progressing   Problem: Clinical Measurements: Goal: Will remain free from infection Outcome: Progressing   Problem: Clinical Measurements: Goal: Respiratory complications will improve Outcome: Progressing   Problem: Nutrition: Goal: Adequate nutrition will be maintained Outcome: Progressing   

## 2023-10-14 NOTE — Progress Notes (Signed)
Notified by night RN that patient arterial line no longer drawing back and morning CBC was inaccurate. Lab attempted to redraw CBC but unsuccessful x2. Lab tech states that another phlebotomist will attempt redraw as soon as available.
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
## 2023-10-15 ENCOUNTER — Encounter (HOSPITAL_COMMUNITY): Payer: Self-pay | Admitting: General Surgery

## 2023-10-15 ENCOUNTER — Inpatient Hospital Stay (HOSPITAL_COMMUNITY): Payer: MEDICAID | Admitting: Anesthesiology

## 2023-10-15 ENCOUNTER — Inpatient Hospital Stay (HOSPITAL_COMMUNITY): Payer: MEDICAID

## 2023-10-15 ENCOUNTER — Encounter (HOSPITAL_COMMUNITY): Admission: EM | Disposition: A | Discharge status: Skilled Nursing Facility | Payer: Self-pay | Source: Home / Self Care

## 2023-10-15 DIAGNOSIS — S31109A Unspecified open wound of abdominal wall, unspecified quadrant without penetration into peritoneal cavity, initial encounter: Secondary | ICD-10-CM

## 2023-10-15 HISTORY — PX: LAPAROTOMY: SHX154

## 2023-10-15 LAB — COMPREHENSIVE METABOLIC PANEL
ALT: 58 U/L — ABNORMAL HIGH (ref 0–44)
AST: 170 U/L — ABNORMAL HIGH (ref 15–41)
Albumin: 2.3 g/dL — ABNORMAL LOW (ref 3.5–5.0)
Alkaline Phosphatase: 59 U/L (ref 38–126)
Anion gap: 6 (ref 5–15)
BUN: 33 mg/dL — ABNORMAL HIGH (ref 8–23)
CO2: 27 mmol/L (ref 22–32)
Calcium: 8.4 mg/dL — ABNORMAL LOW (ref 8.9–10.3)
Chloride: 108 mmol/L (ref 98–111)
Creatinine, Ser: 1.01 mg/dL (ref 0.61–1.24)
GFR, Estimated: >60 mL/min (ref >60)
Glucose, Bld: 184 mg/dL — ABNORMAL HIGH (ref 70–99)
Potassium: 3.8 mmol/L (ref 3.5–5.1)
Sodium: 141 mmol/L (ref 135–145)
Total Bilirubin: 3.7 mg/dL — ABNORMAL HIGH (ref <1.2)
Total Protein: 5.2 g/dL — ABNORMAL LOW (ref 6.5–8.1)

## 2023-10-15 LAB — CBC WITH DIFFERENTIAL/PLATELET
Abs Immature Granulocytes: 0.93 10*3/uL — ABNORMAL HIGH (ref 0.00–0.07)
Basophils Absolute: 0.1 10*3/uL (ref 0.0–0.1)
Basophils Relative: 1 %
Eosinophils Absolute: 0 10*3/uL (ref 0.0–0.5)
Eosinophils Relative: 0 %
HCT: 31.3 % — ABNORMAL LOW (ref 39.0–52.0)
Hemoglobin: 9.9 g/dL — ABNORMAL LOW (ref 13.0–17.0)
Immature Granulocytes: 6 %
Lymphocytes Relative: 6 %
Lymphs Abs: 0.9 10*3/uL (ref 0.7–4.0)
MCH: 27.8 pg (ref 26.0–34.0)
MCHC: 31.6 g/dL (ref 30.0–36.0)
MCV: 87.9 fL (ref 80.0–100.0)
Monocytes Absolute: 1.6 10*3/uL — ABNORMAL HIGH (ref 0.1–1.0)
Monocytes Relative: 11 %
Neutro Abs: 11.6 10*3/uL — ABNORMAL HIGH (ref 1.7–7.7)
Neutrophils Relative %: 76 %
Platelets: 197 10*3/uL (ref 150–400)
RBC: 3.56 MIL/uL — ABNORMAL LOW (ref 4.22–5.81)
RDW: 18.8 % — ABNORMAL HIGH (ref 11.5–15.5)
WBC: 15.2 10*3/uL — ABNORMAL HIGH (ref 4.0–10.5)
nRBC: 1.2 % — ABNORMAL HIGH (ref 0.0–0.2)

## 2023-10-15 LAB — CBC
HCT: 24.4 % — ABNORMAL LOW (ref 39.0–52.0)
Hemoglobin: 7.5 g/dL — ABNORMAL LOW (ref 13.0–17.0)
MCH: 28.1 pg (ref 26.0–34.0)
MCHC: 30.7 g/dL (ref 30.0–36.0)
MCV: 91.4 fL (ref 80.0–100.0)
Platelets: 138 10*3/uL — ABNORMAL LOW (ref 150–400)
RBC: 2.67 MIL/uL — ABNORMAL LOW (ref 4.22–5.81)
RDW: 19.8 % — ABNORMAL HIGH (ref 11.5–15.5)
WBC: 8.1 10*3/uL (ref 4.0–10.5)
nRBC: 2.5 % — ABNORMAL HIGH (ref 0.0–0.2)

## 2023-10-15 LAB — GLUCOSE, CAPILLARY
Glucose-Capillary: 153 mg/dL — ABNORMAL HIGH (ref 70–99)
Glucose-Capillary: 153 mg/dL — ABNORMAL HIGH (ref 70–99)
Glucose-Capillary: 157 mg/dL — ABNORMAL HIGH (ref 70–99)
Glucose-Capillary: 161 mg/dL — ABNORMAL HIGH (ref 70–99)
Glucose-Capillary: 163 mg/dL — ABNORMAL HIGH (ref 70–99)
Glucose-Capillary: 175 mg/dL — ABNORMAL HIGH (ref 70–99)
Glucose-Capillary: 181 mg/dL — ABNORMAL HIGH (ref 70–99)

## 2023-10-15 LAB — PHOSPHORUS: Phosphorus: 3.2 mg/dL (ref 2.5–4.6)

## 2023-10-15 LAB — CREATININE, FLUID (PLEURAL, PERITONEAL, JP DRAINAGE): Creat, Fluid: 0.6 mg/dL

## 2023-10-15 LAB — TRIGLYCERIDES: Triglycerides: 252 mg/dL — ABNORMAL HIGH (ref <150)

## 2023-10-15 LAB — MAGNESIUM: Magnesium: 2.1 mg/dL (ref 1.7–2.4)

## 2023-10-15 LAB — PREPARE RBC (CROSSMATCH)

## 2023-10-15 SURGERY — LAPAROTOMY, EXPLORATORY
Anesthesia: General

## 2023-10-15 MED ORDER — PROPOFOL 1000 MG/100ML IV EMUL
5.0000 ug/kg/min | INTRAVENOUS | Status: DC
  Administered 2023-10-15: 40 ug/kg/min via INTRAVENOUS
  Administered 2023-10-16: 30 ug/kg/min via INTRAVENOUS
  Administered 2023-10-16 (×2): 20 ug/kg/min via INTRAVENOUS
  Administered 2023-10-17 (×2): 35 ug/kg/min via INTRAVENOUS
  Administered 2023-10-17: 30 ug/kg/min via INTRAVENOUS
  Administered 2023-10-18: 50 ug/kg/min via INTRAVENOUS
  Administered 2023-10-18: 40 ug/kg/min via INTRAVENOUS
  Administered 2023-10-18: 30 ug/kg/min via INTRAVENOUS
  Administered 2023-10-18 (×2): 40 ug/kg/min via INTRAVENOUS
  Administered 2023-10-19 (×2): 50 ug/kg/min via INTRAVENOUS
  Filled 2023-10-15 (×15): qty 100

## 2023-10-15 MED ORDER — PROPOFOL 1000 MG/100ML IV EMUL
INTRAVENOUS | Status: AC
  Administered 2023-10-15: 20 ug/kg/min via INTRAVENOUS
  Filled 2023-10-15: qty 100

## 2023-10-15 MED ORDER — CEFAZOLIN SODIUM-DEXTROSE 2-3 GM-%(50ML) IV SOLR
INTRAVENOUS | Status: DC | PRN
  Administered 2023-10-15: 2 g via INTRAVENOUS

## 2023-10-15 MED ORDER — METOPROLOL TARTRATE 5 MG/5ML IV SOLN
5.0000 mg | Freq: Four times a day (QID) | INTRAVENOUS | Status: DC | PRN
  Administered 2023-10-15: 2.5 mg via INTRAVENOUS
  Filled 2023-10-15: qty 5

## 2023-10-15 MED ORDER — TRACE MINERALS CU-MN-SE-ZN 300-55-60-3000 MCG/ML IV SOLN
INTRAVENOUS | Status: AC
  Filled 2023-10-15 (×2): qty 2000

## 2023-10-15 MED ORDER — FAT EMUL FISH OIL/PLANT BASED 20% (SMOFLIPID)IV EMUL
250.0000 mL | INTRAVENOUS | Status: AC
  Administered 2023-10-15: 250 mL via INTRAVENOUS
  Filled 2023-10-15: qty 250

## 2023-10-15 MED ORDER — HYDRALAZINE HCL 20 MG/ML IJ SOLN
10.0000 mg | Freq: Four times a day (QID) | INTRAMUSCULAR | Status: DC | PRN
  Administered 2023-10-15: 10 mg via INTRAVENOUS
  Filled 2023-10-15: qty 1

## 2023-10-15 MED ORDER — NICARDIPINE HCL IN NACL 20-0.86 MG/200ML-% IV SOLN
INTRAVENOUS | Status: AC
  Administered 2023-10-15: 5 mg/h via INTRAVENOUS
  Filled 2023-10-15: qty 200

## 2023-10-15 MED ORDER — POTASSIUM CHLORIDE 20 MEQ PO PACK
40.0000 meq | PACK | Freq: Once | ORAL | Status: AC
  Administered 2023-10-15: 40 meq
  Filled 2023-10-15: qty 2

## 2023-10-15 MED ORDER — ENOXAPARIN SODIUM 30 MG/0.3ML IJ SOSY
30.0000 mg | PREFILLED_SYRINGE | Freq: Two times a day (BID) | INTRAMUSCULAR | Status: DC
  Administered 2023-10-15 – 2023-12-17 (×125): 30 mg via SUBCUTANEOUS
  Filled 2023-10-15 (×125): qty 0.3

## 2023-10-15 MED ORDER — ROCURONIUM BROMIDE 10 MG/ML (PF) SYRINGE
PREFILLED_SYRINGE | INTRAVENOUS | Status: DC | PRN
  Administered 2023-10-15 (×2): 50 mg via INTRAVENOUS

## 2023-10-15 MED ORDER — FENTANYL CITRATE (PF) 250 MCG/5ML IJ SOLN
INTRAMUSCULAR | Status: DC | PRN
  Administered 2023-10-15: 50 ug via INTRAVENOUS

## 2023-10-15 MED ORDER — MIDAZOLAM HCL 2 MG/2ML IJ SOLN
INTRAMUSCULAR | Status: DC | PRN
  Administered 2023-10-15: 2 mg via INTRAVENOUS

## 2023-10-15 MED ORDER — NICARDIPINE HCL IN NACL 20-0.86 MG/200ML-% IV SOLN: 3.0000 mg/h | INTRAVENOUS | Status: DC

## 2023-10-15 MED ORDER — FENTANYL CITRATE (PF) 250 MCG/5ML IJ SOLN
INTRAMUSCULAR | Status: AC
  Filled 2023-10-15: qty 5

## 2023-10-15 MED ORDER — PHENYLEPHRINE 80 MCG/ML (10ML) SYRINGE FOR IV PUSH (FOR BLOOD PRESSURE SUPPORT)
PREFILLED_SYRINGE | INTRAVENOUS | Status: AC
  Filled 2023-10-15: qty 10

## 2023-10-15 MED ORDER — PHENYLEPHRINE 80 MCG/ML (10ML) SYRINGE FOR IV PUSH (FOR BLOOD PRESSURE SUPPORT)
PREFILLED_SYRINGE | INTRAVENOUS | Status: AC
Start: 2023-10-15 — End: ?
  Filled 2023-10-15: qty 10

## 2023-10-15 MED ORDER — PROPOFOL 10 MG/ML IV BOLUS
INTRAVENOUS | Status: AC
Start: 2023-10-15 — End: ?
  Filled 2023-10-15: qty 20

## 2023-10-15 MED ORDER — MIDAZOLAM HCL 2 MG/2ML IJ SOLN
INTRAMUSCULAR | Status: AC
Start: 2023-10-15 — End: ?
  Filled 2023-10-15: qty 2

## 2023-10-15 MED ORDER — ROCURONIUM BROMIDE 10 MG/ML (PF) SYRINGE
PREFILLED_SYRINGE | INTRAVENOUS | Status: AC
Start: 2023-10-15 — End: ?
  Filled 2023-10-15: qty 10

## 2023-10-15 MED ORDER — PHENYLEPHRINE HCL-NACL 20-0.9 MG/250ML-% IV SOLN
INTRAVENOUS | Status: AC
Start: 2023-10-15 — End: ?
  Filled 2023-10-15: qty 250

## 2023-10-15 MED ORDER — SODIUM CHLORIDE 0.9 % IV SOLN
INTRAVENOUS | Status: DC | PRN
Start: 2023-10-15 — End: 2023-10-15

## 2023-10-15 MED ORDER — EPHEDRINE 5 MG/ML INJ
INTRAVENOUS | Status: AC
Start: 2023-10-15 — End: ?
  Filled 2023-10-15: qty 5

## 2023-10-15 SURGICAL SUPPLY — 38 items
BLADE CLIPPER SURG (BLADE) IMPLANT
CANISTER SUCT 3000ML PPV (MISCELLANEOUS) ×2 IMPLANT
CHLORAPREP W/TINT 26 (MISCELLANEOUS) ×2 IMPLANT
COVER SURGICAL LIGHT HANDLE (MISCELLANEOUS) ×2 IMPLANT
DRAPE LAPAROSCOPIC ABDOMINAL (DRAPES) ×2 IMPLANT
DRAPE UNIVERSAL (DRAPES) ×2 IMPLANT
DRAPE WARM FLUID 44X44 (DRAPES) ×2 IMPLANT
DRSG OPSITE POSTOP 4X10 (GAUZE/BANDAGES/DRESSINGS) IMPLANT
DRSG OPSITE POSTOP 4X8 (GAUZE/BANDAGES/DRESSINGS) IMPLANT
DRSG VERAFLOW VAC LRG (GAUZE/BANDAGES/DRESSINGS) IMPLANT
ELECT BLADE 6.5 EXT (BLADE) IMPLANT
ELECT CAUTERY BLADE 6.4 (BLADE) ×2 IMPLANT
ELECT REM PT RETURN 9FT ADLT (ELECTROSURGICAL) ×1
ELECTRODE REM PT RTRN 9FT ADLT (ELECTROSURGICAL) ×2 IMPLANT
GLOVE BIO SURGEON STRL SZ 6.5 (GLOVE) ×2 IMPLANT
GLOVE BIOGEL PI IND STRL 6 (GLOVE) ×2 IMPLANT
GOWN STRL REUS W/ TWL LRG LVL3 (GOWN DISPOSABLE) ×4 IMPLANT
GOWN STRL REUS W/TWL LRG LVL3 (GOWN DISPOSABLE) ×2
HANDLE SUCTION POOLE (INSTRUMENTS) ×2 IMPLANT
KIT BASIN OR (CUSTOM PROCEDURE TRAY) ×2 IMPLANT
KIT TURNOVER KIT B (KITS) ×2 IMPLANT
LIGASURE IMPACT 36 18CM CVD LR (INSTRUMENTS) IMPLANT
NS IRRIG 1000ML POUR BTL (IV SOLUTION) ×4 IMPLANT
PACK GENERAL/GYN (CUSTOM PROCEDURE TRAY) ×2 IMPLANT
PAD ARMBOARD 7.5X6 YLW CONV (MISCELLANEOUS) ×2 IMPLANT
SPONGE T-LAP 18X18 ~~LOC~~+RFID (SPONGE) IMPLANT
STAPLER VISISTAT 35W (STAPLE) ×2 IMPLANT
SUCTION POOLE HANDLE (INSTRUMENTS) ×1
SUT PDS AB 1 TP1 54 (SUTURE) IMPLANT
SUT PDS AB 1 TP1 96 (SUTURE) IMPLANT
SUT SILK 2 0 SH CR/8 (SUTURE) ×2 IMPLANT
SUT SILK 2 0 TIES 10X30 (SUTURE) ×2 IMPLANT
SUT SILK 3 0 SH CR/8 (SUTURE) ×2 IMPLANT
SUT SILK 3 0 TIES 10X30 (SUTURE) ×2 IMPLANT
SUT VIC AB 3-0 SH 18 (SUTURE) IMPLANT
TOWEL GREEN STERILE (TOWEL DISPOSABLE) ×2 IMPLANT
TRAY FOLEY MTR SLVR 16FR STAT (SET/KITS/TRAYS/PACK) IMPLANT
YANKAUER SUCT BULB TIP NO VENT (SUCTIONS) IMPLANT
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
## 2023-10-15 NOTE — Anesthesia Preprocedure Evaluation (Signed)
Anesthesia Evaluation  Preop documentation limited or incomplete due to emergent nature of procedure.  Airway        Dental   Pulmonary           Cardiovascular      Neuro/Psych    GI/Hepatic   Endo/Other    Renal/GU      Musculoskeletal   Abdominal   Peds  Hematology  (+) Blood dyscrasia, anemia Lab Results      Component                Value               Date                      WBC                      8.1                 10/15/2023                HGB                      7.5 (L)             10/15/2023                HCT                      24.4 (L)            10/15/2023                MCV                      91.4                10/15/2023                PLT                      138 (L)             10/15/2023              Anesthesia Other Findings   Reproductive/Obstetrics                              Anesthesia Physical Anesthesia Plan  ASA: 4 and emergent  Anesthesia Plan: General   Post-op Pain Management:    Induction:   PONV Risk Score and Plan: 2  Airway Management Planned: Oral ETT  Additional Equipment:   Intra-op Plan:   Post-operative Plan:   Informed Consent:      History available from chart only and Only emergency history available  Plan Discussed with:   Anesthesia Plan Comments:          Anesthesia Quick Evaluation

## 2023-10-15 NOTE — Progress Notes (Signed)
RN concerned with placement of NG tube so abdomen xray was obtained.  MD Lovick confirmed that NG tube was in correct placement.

## 2023-10-15 NOTE — Op Note (Signed)
   Operative Note   Date: 10/15/2023  Procedure: exploratory laparotomy  Pre-op diagnosis: open abdomen Post-op diagnosis: same  Indication and clinical history: The patient is a 62 y.o. year old male with open abdomen after exlap      Surgeon: Diamantina Monks, MD Assistant: Janee Morn, MD  Anesthesiologist: Maple Hudson, MD Anesthesia: General  Findings:  Specimen: peritoneal fluid sent for creatinine EBL: <5cc Drains/Implants: none Products Given: 1u pRBC  Disposition: ICU - intubated and hemodynamically stable.  Description of procedure: The patient was positioned supine on the operating room table. General anesthetic induction and intubation were uneventful. Foley catheter insertion was performed and was atraumatic. Time-out was performed verifying correct patient, procedure, signature of informed consent, and administration of pre-operative antibiotics. The abdomen was prepped and draped in the usual sterile fashion after removal of the outer vac drape.  The inner vac drape was removed and the abdomen explored in its entirety. There was scattered bruising of small and large bowel as well as the associated mesentery, but the bowel was viable. Some of the peritoneal fluid was aspirated and sent for a creatinine. The abdomen was copiously irrigated. The fascia was closed with #1 looped PDS suture. Wet to dry dressing was applied.   All sponge and instrument counts were correct at the conclusion of the procedure. MIS x-ray performed prior to closure due to abdomen being open preoperatively. The radiologist confirmed no retained lap/instrument. The patient was transported to the ICU in stable condition. There were no complications.     Diamantina Monks, MD General and Trauma Surgery [facility] Surgery

## 2023-10-15 NOTE — Progress Notes (Signed)
Patient became tachycardic (HR 130s) and Hypertensive per Aline (sys 220s).  RN gave Fentanyl bolus as well and versed bolus to achieve rass goals.  Boluses were ineffective.  RN then called and spoke w/ Dr. Bedelia Person and Dr. Hillery Hunter (bedside).  Orders were given for propofol, increasing fentanyl ceiling, cbc, cardene.
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
## 2023-10-15 NOTE — Progress Notes (Signed)
Patient came back from OR with 1 PRBC running through IV.  Blood product sheet was filled out by OR staff but not documented in flowsheets.  Patient received of blood and the tubing and bag were discarded.
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
## 2023-10-15 NOTE — Transfer of Care (Signed)
Immediate Anesthesia Transfer of Care Note  Patient: [name] [name]  Procedure(s) Performed: EXPLORATORY LAPAROTOMY  Patient Location: ICU  Anesthesia Type:General  Level of Consciousness: Patient remains intubated per anesthesia plan  Airway & Oxygen Therapy: Patient remains intubated per anesthesia plan  Post-op Assessment: Report given to RN and Post -op Vital signs reviewed and stable  Post vital signs: Reviewed and stable  Last Vitals:  Vitals Value Taken Time  BP 141/81 10/15/23 1452  Temp 99   Pulse 93 10/15/23 1455  Resp 20 10/15/23 1455  SpO2 95 % 10/15/23 1455  Vitals shown include unfiled device data.  Last Pain:  Vitals:   10/14/23 0700  TempSrc: Oral  PainSc:          Complications: No notable events documented.

## 2023-10-15 NOTE — Progress Notes (Signed)
  Progress Note    10/15/2023 8:46 AM 3 Days Post-Op  Subjective:  intubated and sedated   Vitals:   10/15/23 0800 10/15/23 0805  BP: (!) 139/110 (!) 189/73  Pulse: 91 97  Resp: (!) 22 (!) 35  Temp: (!) 100.6 F (38.1 C)   SpO2: 99% 99%   Physical Exam: Cardiac:  tachy Lungs:  intubated Extremities:  well perfused and warm with palpable radial and Dp pulses bilaterally Neurologic: sedated  CBC    Component Value Date/Time   WBC 8.1 10/15/2023 0506   RBC 2.67 (L) 10/15/2023 0506   HGB 7.5 (L) 10/15/2023 0506   HCT 24.4 (L) 10/15/2023 0506   PLT 138 (L) 10/15/2023 0506   MCV 91.4 10/15/2023 0506   MCH 28.1 10/15/2023 0506   MCHC 30.7 10/15/2023 0506   RDW 19.8 (H) 10/15/2023 0506    BMET    Component Value Date/Time   NA 141 10/15/2023 0506   K 3.8 10/15/2023 0506   CL 108 10/15/2023 0506   CO2 27 10/15/2023 0506   GLUCOSE 184 (H) 10/15/2023 0506   BUN 33 (H) 10/15/2023 0506   CREATININE 1.01 10/15/2023 0506   CALCIUM 8.4 (L) 10/15/2023 0506   GFRNONAA >60 10/15/2023 0506    INR    Component Value Date/Time   INR 1.5 (H) 10/09/2023 0312     Intake/Output Summary (Last 24 hours) at 10/15/2023 0846 Last data filed at 10/15/2023 0800 Gross per 24 hour  Intake 4060.51 ml  Output 6435 ml  Net -2374.49 ml     Assessment/Plan:  62 y.o. male is s/p TEVAR 6 Days Post Op   Right groin without swelling or hematoma  Bilateral lower extremities well perfused and warm with palpable pedal pulses Bilateral upper extremities well perfused and warm with palpable radial pulses To OR today with General Surgery Plan to repeat CT once more medically stable to evaluate repair  DVT prophylaxis:  SCDs   Graceann Congress, PA-C [facility] [phone] 10/15/2023 8:46 AM

## 2023-10-15 NOTE — Progress Notes (Signed)
RT NOTE: holding SBT this AM due to plans for patient to be going to OR today.  Will continue to monitor.

## 2023-10-16 ENCOUNTER — Encounter (HOSPITAL_COMMUNITY): Payer: Self-pay | Admitting: Surgery

## 2023-10-16 LAB — GLUCOSE, CAPILLARY
Glucose-Capillary: 161 mg/dL — ABNORMAL HIGH (ref 70–99)
Glucose-Capillary: 164 mg/dL — ABNORMAL HIGH (ref 70–99)
Glucose-Capillary: 181 mg/dL — ABNORMAL HIGH (ref 70–99)
Glucose-Capillary: 182 mg/dL — ABNORMAL HIGH (ref 70–99)
Glucose-Capillary: 191 mg/dL — ABNORMAL HIGH (ref 70–99)
Glucose-Capillary: 204 mg/dL — ABNORMAL HIGH (ref 70–99)

## 2023-10-16 LAB — COMPREHENSIVE METABOLIC PANEL
ALT: 54 U/L — ABNORMAL HIGH (ref 0–44)
AST: 130 U/L — ABNORMAL HIGH (ref 15–41)
Albumin: 2.2 g/dL — ABNORMAL LOW (ref 3.5–5.0)
Alkaline Phosphatase: 65 U/L (ref 38–126)
Anion gap: 6 (ref 5–15)
BUN: 37 mg/dL — ABNORMAL HIGH (ref 8–23)
CO2: 24 mmol/L (ref 22–32)
Calcium: 8 mg/dL — ABNORMAL LOW (ref 8.9–10.3)
Chloride: 111 mmol/L (ref 98–111)
Creatinine, Ser: 0.91 mg/dL (ref 0.61–1.24)
GFR, Estimated: >60 mL/min (ref >60)
Glucose, Bld: 201 mg/dL — ABNORMAL HIGH (ref 70–99)
Potassium: 3.8 mmol/L (ref 3.5–5.1)
Sodium: 141 mmol/L (ref 135–145)
Total Bilirubin: 2.9 mg/dL — ABNORMAL HIGH (ref <1.2)
Total Protein: 5.3 g/dL — ABNORMAL LOW (ref 6.5–8.1)

## 2023-10-16 LAB — TRIGLYCERIDES: Triglycerides: 315 mg/dL — ABNORMAL HIGH (ref <150)

## 2023-10-16 MED ORDER — PIVOT 1.5 CAL PO LIQD
1000.0000 mL | ORAL | Status: DC
  Administered 2023-10-17 – 2023-10-18 (×2): 1000 mL

## 2023-10-16 MED ORDER — POTASSIUM CHLORIDE 20 MEQ PO PACK
40.0000 meq | PACK | Freq: Once | ORAL | Status: AC
  Administered 2023-10-16: 40 meq
  Filled 2023-10-16: qty 2

## 2023-10-16 MED ORDER — TRACE MINERALS CU-MN-SE-ZN 300-55-60-3000 MCG/ML IV SOLN
INTRAVENOUS | Status: AC
  Filled 2023-10-16: qty 2000

## 2023-10-16 NOTE — Progress Notes (Signed)
Trauma/Critical Care Follow Up Note  Subjective:    Overnight Issues:   Objective:  Vital signs for last 24 hours: Temp:  [98.4 F (36.9 C)-100.9 F (38.3 C)] 99 F (37.2 C) (11/19 0800) Pulse Rate:  [76-134] 85 (11/19 0800) Resp:  [15-37] 28 (11/19 0800) BP: (97-191)/(61-94) 103/61 (11/19 0800) SpO2:  [90 %-100 %] 100 % (11/19 0800) Arterial Line BP: (95-222)/(51-108) 121/54 (11/19 0800) FiO2 (%):  [40 %] 40 % (11/19 0831) Weight:  [83.7 kg] 83.7 kg (11/19 0706)  Hemodynamic parameters for last 24 hours:    Intake/Output from previous day: 11/18 0701 - 11/19 0700 In: 4266.5 [I.V.:3453.2; Blood:315; NG/GT:448.3; IV Piggyback:50] Out: 2550 [Urine:2350; Drains:200]  Intake/Output this shift: Total I/O In: 325.5 [I.V.:285.5; NG/GT:40] Out: 330 [Urine:330]  Vent settings for last 24 hours: Vent Mode: PRVC FiO2 (%):  [40 %] 40 % Set Rate:  [28 bmp] 28 bmp Vt Set:  [450 mL-510 mL] 510 mL PEEP:  [8 cmH20] 8 cmH20 Plateau Pressure:  [14 cmH20-25 cmH20] 25 cmH20  Physical Exam:  Gen: comfortable, no distress Neuro: sedated on exam HEENT: PERRL Neck: supple CV: RRR Pulm: unlabored breathing on mechanical ventilation-full support Abd: soft, NT, midline wound with granulation tissue  GU: urine clear and yellow, +Foley Extr: wwp, no edema  Results for orders placed or performed during the hospital encounter of 10/08/23 (from the past 24 hour(s))  Glucose, capillary     Status: Abnormal   Collection Time: 10/15/23 11:00 AM  Result Value Ref Range   Glucose-Capillary 163 (H) 70 - 99 mg/dL  Prepare RBC (crossmatch)     Status: None   Collection Time: 10/15/23 11:45 AM  Result Value Ref Range   Order Confirmation      ORDER PROCESSED BY BLOOD BANK Performed at [facility], [address], [place], [place] [address]   Type and screen [facility]     Status: None (Preliminary result)   Collection Time: 10/15/23 11:45 AM  Result Value Ref Range    ABO/RH(D) A POS    Antibody Screen NEG    Sample Expiration 10/18/2023,2359    Unit Number [id number]    Blood Component Type RBC LR PHER1    Unit division 00    Status of Unit ALLOCATED    Transfusion Status OK TO TRANSFUSE    Crossmatch Result      Compatible Performed at [facility], [address], [place], [place] [address]    Unit Number [id number]    Blood Component Type RED CELLS,LR    Unit division 00    Status of Unit ISSUED    Transfusion Status OK TO TRANSFUSE    Crossmatch Result Compatible   Creatinine, fluid (pleural, peritoneal, JP Drainage)     Status: None   Collection Time: 10/15/23  2:07 PM  Result Value Ref Range   Creat, Fluid 0.6 mg/dL   Fluid Type-FCRE PERITONEAL   Glucose, capillary     Status: Abnormal   Collection Time: 10/15/23  3:43 PM  Result Value Ref Range   Glucose-Capillary 157 (H) 70 - 99 mg/dL  CBC with Differential/Platelet     Status: Abnormal   Collection Time: 10/15/23  6:48 PM  Result Value Ref Range   WBC 15.2 (H) 4.0 - 10.5 K/uL   RBC 3.56 (L) 4.22 - 5.81 MIL/uL   Hemoglobin 9.9 (L) 13.0 - 17.0 g/dL   HCT 63.8 (L) 75.6 - 43.3 %   MCV 87.9  80.0 - 100.0 fL   MCH 27.8 26.0 - 34.0 pg   MCHC 31.6 30.0 - 36.0 g/dL   RDW 91.4 (H) 78.2 - 95.6 %   Platelets 197 150 - 400 K/uL   nRBC 1.2 (H) 0.0 - 0.2 %   Neutrophils Relative % 76 %   Neutro Abs 11.6 (H) 1.7 - 7.7 K/uL   Lymphocytes Relative 6 %   Lymphs Abs 0.9 0.7 - 4.0 K/uL   Monocytes Relative 11 %   Monocytes Absolute 1.6 (H) 0.1 - 1.0 K/uL   Eosinophils Relative 0 %   Eosinophils Absolute 0.0 0.0 - 0.5 K/uL   Basophils Relative 1 %   Basophils Absolute 0.1 0.0 - 0.1 K/uL   Immature Granulocytes 6 %   Abs Immature Granulocytes 0.93 (H) 0.00 - 0.07 K/uL  Glucose, capillary     Status: Abnormal   Collection Time: 10/15/23  8:00 PM  Result Value Ref Range   Glucose-Capillary 181 (H) 70 - 99 mg/dL  Glucose, capillary     Status: Abnormal   Collection Time:  10/15/23 11:55 PM  Result Value Ref Range   Glucose-Capillary 153 (H) 70 - 99 mg/dL  Glucose, capillary     Status: Abnormal   Collection Time: 10/16/23  3:47 AM  Result Value Ref Range   Glucose-Capillary 181 (H) 70 - 99 mg/dL  Comprehensive metabolic panel     Status: Abnormal   Collection Time: 10/16/23  5:30 AM  Result Value Ref Range   Sodium 141 135 - 145 mmol/L   Potassium 3.8 3.5 - 5.1 mmol/L   Chloride 111 98 - 111 mmol/L   CO2 24 22 - 32 mmol/L   Glucose, Bld 201 (H) 70 - 99 mg/dL   BUN 37 (H) 8 - 23 mg/dL   Creatinine, Ser 2.13 0.61 - 1.24 mg/dL   Calcium 8.0 (L) 8.9 - 10.3 mg/dL   Total Protein 5.3 (L) 6.5 - 8.1 g/dL   Albumin 2.2 (L) 3.5 - 5.0 g/dL   AST 086 (H) 15 - 41 U/L   ALT 54 (H) 0 - 44 U/L   Alkaline Phosphatase 65 38 - 126 U/L   Total Bilirubin 2.9 (H) <1.2 mg/dL   GFR, Estimated >57 >84 mL/min   Anion gap 6 5 - 15  Triglycerides     Status: Abnormal   Collection Time: 10/16/23  5:30 AM  Result Value Ref Range   Triglycerides 315 (H) <150 mg/dL  Glucose, capillary     Status: Abnormal   Collection Time: 10/16/23  7:58 AM  Result Value Ref Range   Glucose-Capillary 161 (H) 70 - 99 mg/dL    Assessment & Plan: The plan of care was discussed with the bedside nurse for the day, nick, who is in agreement with this plan and no additional concerns were raised.   Present on Admission: **None**    LOS: 8 days   Additional comments:I reviewed the patient's new clinical lab test results.   and I reviewed the patients new imaging test results.    PHBC   S/P ex lap, pelvic packing (5 laps), and temporary abdominal closure with vicryl mesh by Dr. Dossie Der 11/12 - return to OR 11/13 for ex lap, removal of packs and Abthera placement by Dr. 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  Wham consulted, R femur in skeletal traction, d/w Dr. Jena Gauss and plan OR 11/18, may need two stage operation.   Pelvic hemorrhage - packed as above, S/P IR embolization by Dr. Elby Showers 11/12 Urology consulted (Dr. Lafonda Mosses) - no bladder injury found, uncertain status of R ureter Acute hypoxic ventilator dependent respiratory failure - TRALI, 40% and PEEP 8, wean PEEP to 5 Right gluteal hematoma Right psoas hematoma Right 11th rib fracture Multiple lumbar vertebral body transverse process fractures Right scalp hematoma ABL anemia - 1u PRBC 11/14 ID - fever and rhonchi - started cefepime 11/14--d/c, resp cx negative  FEN -TPN, incr TF to 40/h VTE - SCDs, LMWH Dispo - ICU, OR 11/20 with ortho  Critical Care Total Time: 35 minutes  Diamantina Monks, MD Trauma & General Surgery Please use [email or web address] to contact on call provider  10/16/2023  *Care during the described time interval was provided by me. I have reviewed this patient's available data, including medical history, events of note, physical examination and test results as part of my evaluation.

## 2023-10-16 NOTE — Plan of Care (Signed)
Nain remains intubated and sedated. Tube feeds increased per Dr. Bedelia Person this AM feeds tolerated well so far. Foley in place, no stool, hypoactive bowel sounds. Scattered healing abrasions. ABD incision was closed yesterday. Sister at bedside today, updated, all questions answered.     Problem: Nutrition: Goal: Adequate nutrition will be maintained Outcome: Progressing   Problem: Skin Integrity: Goal: Risk for impaired skin integrity will decrease Outcome: Progressing

## 2023-10-16 NOTE — Progress Notes (Signed)
Per Dr. Bedelia Person tube feed was turned to 61mLs/hr this morning.

## 2023-10-16 NOTE — Anesthesia Postprocedure Evaluation (Signed)
Anesthesia Post Note  Patient: [name] [name]  Procedure(s) Performed: EXPLORATORY LAPAROTOMY     Patient location during evaluation: SICU Anesthesia Type: General Level of consciousness: sedated Pain management: pain level controlled Vital Signs Assessment: post-procedure vital signs reviewed and stable Respiratory status: patient remains intubated per anesthesia plan Cardiovascular status: stable Postop Assessment: no apparent nausea or vomiting Anesthetic complications: no   No notable events documented.             [name] [name]
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
## 2023-10-17 ENCOUNTER — Inpatient Hospital Stay (HOSPITAL_COMMUNITY): Payer: MEDICAID

## 2023-10-17 ENCOUNTER — Encounter (HOSPITAL_COMMUNITY): Admission: EM | Disposition: A | Discharge status: Skilled Nursing Facility | Payer: Self-pay | Source: Home / Self Care

## 2023-10-17 ENCOUNTER — Other Ambulatory Visit: Payer: Self-pay

## 2023-10-17 DIAGNOSIS — S32401A Unspecified fracture of right acetabulum, initial encounter for closed fracture: Secondary | ICD-10-CM

## 2023-10-17 HISTORY — PX: OPEN REDUCTION INTERNAL FIXATION ACETABULUM FRACTURE POSTERIOR: SHX6833

## 2023-10-17 HISTORY — PX: ORIF PELVIC FRACTURE: SHX2128

## 2023-10-17 LAB — CYTOLOGY - NON PAP

## 2023-10-17 LAB — GLUCOSE, CAPILLARY
Glucose-Capillary: 146 mg/dL — ABNORMAL HIGH (ref 70–99)
Glucose-Capillary: 152 mg/dL — ABNORMAL HIGH (ref 70–99)
Glucose-Capillary: 154 mg/dL — ABNORMAL HIGH (ref 70–99)
Glucose-Capillary: 159 mg/dL — ABNORMAL HIGH (ref 70–99)
Glucose-Capillary: 186 mg/dL — ABNORMAL HIGH (ref 70–99)
Glucose-Capillary: 199 mg/dL — ABNORMAL HIGH (ref 70–99)

## 2023-10-17 LAB — COMPREHENSIVE METABOLIC PANEL
ALT: 60 U/L — ABNORMAL HIGH (ref 0–44)
AST: 111 U/L — ABNORMAL HIGH (ref 15–41)
Albumin: 2.2 g/dL — ABNORMAL LOW (ref 3.5–5.0)
Alkaline Phosphatase: 86 U/L (ref 38–126)
Anion gap: 5 (ref 5–15)
BUN: 38 mg/dL — ABNORMAL HIGH (ref 8–23)
CO2: 25 mmol/L (ref 22–32)
Calcium: 8.4 mg/dL — ABNORMAL LOW (ref 8.9–10.3)
Chloride: 114 mmol/L — ABNORMAL HIGH (ref 98–111)
Creatinine, Ser: 0.93 mg/dL (ref 0.61–1.24)
GFR, Estimated: >60 mL/min (ref >60)
Glucose, Bld: 209 mg/dL — ABNORMAL HIGH (ref 70–99)
Potassium: 4.6 mmol/L (ref 3.5–5.1)
Sodium: 144 mmol/L (ref 135–145)
Total Bilirubin: 2.6 mg/dL — ABNORMAL HIGH (ref <1.2)
Total Protein: 5.6 g/dL — ABNORMAL LOW (ref 6.5–8.1)

## 2023-10-17 SURGERY — OPEN REDUCTION INTERNAL FIXATION (ORIF) PELVIC FRACTURE
Anesthesia: General | Site: Pelvis | Laterality: Right

## 2023-10-17 MED ORDER — CLINIMIX E/DEXTROSE (8/10) 8 % IV SOLN
INTRAVENOUS | Status: DC
  Filled 2023-10-17: qty 1000

## 2023-10-17 MED ORDER — FENTANYL CITRATE (PF) 250 MCG/5ML IJ SOLN
INTRAMUSCULAR | Status: AC
  Filled 2023-10-17: qty 5

## 2023-10-17 MED ORDER — PROPOFOL 10 MG/ML IV BOLUS
INTRAVENOUS | Status: AC
  Filled 2023-10-17: qty 20

## 2023-10-17 MED ORDER — VANCOMYCIN HCL 1000 MG IV SOLR
INTRAVENOUS | Status: AC
  Filled 2023-10-17: qty 20

## 2023-10-17 MED ORDER — CEFAZOLIN SODIUM-DEXTROSE 2-3 GM-%(50ML) IV SOLR
INTRAVENOUS | Status: DC | PRN
  Administered 2023-10-17: 2 g via INTRAVENOUS

## 2023-10-17 MED ORDER — ROCURONIUM BROMIDE 100 MG/10ML IV SOLN
INTRAVENOUS | Status: DC | PRN
  Administered 2023-10-17: 50 mg via INTRAVENOUS
  Administered 2023-10-17: 60 mg via INTRAVENOUS
  Administered 2023-10-17: 50 mg via INTRAVENOUS

## 2023-10-17 MED ORDER — 0.9 % SODIUM CHLORIDE (POUR BTL) OPTIME
TOPICAL | Status: DC | PRN
  Administered 2023-10-17: 1000 mL

## 2023-10-17 MED ORDER — LACTATED RINGERS IV SOLN: INTRAVENOUS | Status: DC | PRN

## 2023-10-17 MED ORDER — FUROSEMIDE 10 MG/ML IJ SOLN
40.0000 mg | Freq: Once | INTRAMUSCULAR | Status: AC
  Administered 2023-10-17: 40 mg via INTRAVENOUS
  Filled 2023-10-17: qty 4

## 2023-10-17 MED ORDER — PHENYLEPHRINE HCL-NACL 20-0.9 MG/250ML-% IV SOLN
INTRAVENOUS | Status: DC | PRN
  Administered 2023-10-17: 30 ug/min via INTRAVENOUS

## 2023-10-17 MED ORDER — CEFAZOLIN SODIUM-DEXTROSE 2-4 GM/100ML-% IV SOLN
2.0000 g | Freq: Three times a day (TID) | INTRAVENOUS | Status: AC
  Administered 2023-10-17 – 2023-10-18 (×3): 2 g via INTRAVENOUS
  Filled 2023-10-17 (×3): qty 100

## 2023-10-17 MED ORDER — ROCURONIUM BROMIDE 10 MG/ML (PF) SYRINGE
PREFILLED_SYRINGE | INTRAVENOUS | Status: AC
  Filled 2023-10-17: qty 10

## 2023-10-17 MED ORDER — FENTANYL CITRATE (PF) 100 MCG/2ML IJ SOLN
INTRAMUSCULAR | Status: DC | PRN
  Administered 2023-10-17: 50 ug via INTRAVENOUS
  Administered 2023-10-17 (×2): 100 ug via INTRAVENOUS

## 2023-10-17 SURGICAL SUPPLY — 57 items
BAG COUNTER SPONGE SURGICOUNT (BAG) ×3 IMPLANT
BIT DRILL CANN 4.5MM (BIT) IMPLANT
BLADE CLIPPER SURG (BLADE) IMPLANT
BLADE SURG SZ11 CARB STEEL (BLADE) IMPLANT
BNDG GAUZE DERMACEA FLUFF 4 (GAUZE/BANDAGES/DRESSINGS) IMPLANT
CHLORAPREP W/TINT 26 (MISCELLANEOUS) ×3 IMPLANT
DERMABOND ADVANCED .7 DNX12 (GAUZE/BANDAGES/DRESSINGS) ×6 IMPLANT
DRAIN CHANNEL 15F RND FF W/TCR (WOUND CARE) IMPLANT
DRAPE C-ARM 42X72 X-RAY (DRAPES) ×3 IMPLANT
DRAPE C-ARMOR (DRAPES) ×3 IMPLANT
DRAPE HALF SHEET 40X57 (DRAPES) ×6 IMPLANT
DRAPE INCISE IOBAN 66X45 STRL (DRAPES) ×6 IMPLANT
DRAPE SURG 17X23 STRL (DRAPES) ×18 IMPLANT
DRAPE U-SHAPE 47X51 STRL (DRAPES) ×3 IMPLANT
DRESSING MEPILEX FLEX 4X4 (GAUZE/BANDAGES/DRESSINGS) ×6 IMPLANT
DRSG MEPILEX FLEX 4X4 (GAUZE/BANDAGES/DRESSINGS) ×8
DRSG MEPILEX POST OP 4X8 (GAUZE/BANDAGES/DRESSINGS) ×6 IMPLANT
DRSG TEGADERM 2-3/8X2-3/4 SM (GAUZE/BANDAGES/DRESSINGS) IMPLANT
DRSG XEROFORM 1X8 (GAUZE/BANDAGES/DRESSINGS) IMPLANT
ELECT REM PT RETURN 9FT ADLT (ELECTROSURGICAL) ×2
ELECTRODE REM PT RTRN 9FT ADLT (ELECTROSURGICAL) ×3 IMPLANT
EVACUATOR SILICONE 100CC (DRAIN) IMPLANT
GAUZE PAD ABD 8X10 STRL (GAUZE/BANDAGES/DRESSINGS) IMPLANT
GAUZE SPONGE 2X2 8PLY STRL LF (GAUZE/BANDAGES/DRESSINGS) IMPLANT
GAUZE SPONGE 4X4 12PLY STRL (GAUZE/BANDAGES/DRESSINGS) IMPLANT
GLOVE BIO SURGEON STRL SZ 6.5 (GLOVE) ×9 IMPLANT
GLOVE BIO SURGEON STRL SZ7.5 (GLOVE) ×12 IMPLANT
GLOVE BIOGEL PI IND STRL 6.5 (GLOVE) ×3 IMPLANT
GLOVE BIOGEL PI IND STRL 7.5 (GLOVE) ×3 IMPLANT
GOWN STRL REUS W/ TWL LRG LVL3 (GOWN DISPOSABLE) ×6 IMPLANT
GUIDEWIRE 2.0MM (WIRE) IMPLANT
GUIDEWIRE 2.8 THREAD 450 L ST (WIRE) IMPLANT
KIT BASIN OR (CUSTOM PROCEDURE TRAY) ×3 IMPLANT
KIT TURNOVER KIT B (KITS) ×3 IMPLANT
MANIFOLD NEPTUNE II (INSTRUMENTS) ×3 IMPLANT
NS IRRIG 1000ML POUR BTL (IV SOLUTION) ×6 IMPLANT
PACK TOTAL JOINT (CUSTOM PROCEDURE TRAY) ×3 IMPLANT
PACK UNIVERSAL I (CUSTOM PROCEDURE TRAY) ×3 IMPLANT
PAD ARMBOARD 7.5X6 YLW CONV (MISCELLANEOUS) ×6 IMPLANT
SCREW CANN FT 7.5X150 STRL (Screw) IMPLANT
SCREW CANN FT 7.5X80 (Screw) IMPLANT
SCREW SHANZ 5.0X175MM (Screw) IMPLANT
SPONGE T-LAP 18X18 ~~LOC~~+RFID (SPONGE) IMPLANT
STAPLER VISISTAT 35W (STAPLE) ×3 IMPLANT
SUCTION TUBE FRAZIER 10FR DISP (SUCTIONS) ×3 IMPLANT
SUT ETHILON 3 0 PS 1 (SUTURE) IMPLANT
SUT MNCRL AB 3-0 PS2 18 (SUTURE) ×3 IMPLANT
SUT MON AB 2-0 CT1 36 (SUTURE) ×3 IMPLANT
SUT VIC AB 0 CT1 27XBRD ANBCTR (SUTURE) ×6 IMPLANT
SUT VIC AB 1 CT1 18XCR BRD 8 (SUTURE) IMPLANT
SUT VIC AB 2-0 CT1 TAPERPNT 27 (SUTURE) ×6 IMPLANT
SUT VIC AB CT1 27XBRD ANBCTRL (SUTURE)
TOWEL GREEN STERILE (TOWEL DISPOSABLE) ×6 IMPLANT
TOWEL GREEN STERILE FF (TOWEL DISPOSABLE) ×3 IMPLANT
TRAY FOLEY MTR SLVR 16FR STAT (SET/KITS/TRAYS/PACK) IMPLANT
WASHER CANN ORTH F/SCREW 4 (Washer) IMPLANT
WATER STERILE IRR 1000ML POUR (IV SOLUTION) ×12 IMPLANT
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
## 2023-10-17 NOTE — Addendum Note (Signed)
Addendum  created 10/17/23 2041 by [place], MD   Attestation recorded in Intraprocedure, Intraprocedure Attestations filed
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
## 2023-10-17 NOTE — Transfer of Care (Signed)
Immediate Anesthesia Transfer of Care Note  Patient: [name] [name]  Procedure(s) Performed: OPEN REDUCTION INTERNAL FIXATION (ORIF) PELVIC FRACTURE (Bilateral: Pelvis) OPEN REDUCTION INTERNAL FIXATION  RIGHT ACETABULUM FRACTURE (Right: Hip)  Patient Location: ICU  Anesthesia Type:General  Level of Consciousness: sedated  Airway & Oxygen Therapy: Patient placed on Ventilator (see vital sign flow sheet for setting)  Post-op Assessment: Report given to RN and Post -op Vital signs reviewed and stable  Post vital signs: Reviewed and stable  Last Vitals:  Vitals Value Taken Time  BP 124/71 10/17/23 1500  Temp 36.9 C 10/17/23 1509  Pulse 102 10/17/23 1509  Resp 25 10/17/23 1509  SpO2 88 % 10/17/23 1509  Vitals shown include unfiled device data.  Last Pain:  Vitals:   10/17/23 0800  TempSrc: Bladder  PainSc:          Complications: No notable events documented.

## 2023-10-17 NOTE — Anesthesia Postprocedure Evaluation (Signed)
Anesthesia Post Note  Patient: [name] [name]  Procedure(s) Performed: OPEN REDUCTION INTERNAL FIXATION (ORIF) PELVIC FRACTURE (Bilateral: Pelvis) OPEN REDUCTION INTERNAL FIXATION  RIGHT ACETABULUM FRACTURE (Right: Hip)     Patient location during evaluation: SICU Anesthesia Type: General Level of consciousness: sedated Pain management: pain level controlled Vital Signs Assessment: post-procedure vital signs reviewed and stable Respiratory status: patient remains intubated per anesthesia plan Cardiovascular status: blood pressure returned to baseline and stable Postop Assessment: no apparent nausea or vomiting Anesthetic complications: no   No notable events documented.  Last Vitals:  Vitals:   10/17/23 1945 10/17/23 2000  BP: 106/68 121/73  Pulse: 91 91  Resp: (!) 21 (!) 26  Temp: 37.7 C 37.7 C  SpO2: 100% 100%    Last Pain:  Vitals:   10/17/23 2000  TempSrc: Bladder  PainSc:                  [name]
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
## 2023-10-17 NOTE — Interval H&P Note (Signed)
History and Physical Interval Note:  10/17/2023 11:33 AM  [name] [name]  has presented today for surgery, with the diagnosis of Right acetabular fracture, pelvic fracture.  The various methods of treatment have been discussed with the patient and family. After consideration of risks, benefits and other options for treatment, the patient has consented to  Procedure(s): OPEN REDUCTION INTERNAL FIXATION (ORIF) PELVIC FRACTURE (Bilateral) as a surgical intervention.  The patient's history has been reviewed, patient examined, no change in status, stable for surgery.  I have reviewed the patient's chart and labs.  Questions were answered to the patient's satisfaction.     Caryn Bee P Quadarius Henton
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
## 2023-10-18 ENCOUNTER — Encounter (HOSPITAL_COMMUNITY): Payer: Self-pay | Admitting: Student

## 2023-10-18 LAB — GLUCOSE, CAPILLARY
Glucose-Capillary: 132 mg/dL — ABNORMAL HIGH (ref 70–99)
Glucose-Capillary: 136 mg/dL — ABNORMAL HIGH (ref 70–99)
Glucose-Capillary: 181 mg/dL — ABNORMAL HIGH (ref 70–99)
Glucose-Capillary: 183 mg/dL — ABNORMAL HIGH (ref 70–99)
Glucose-Capillary: 186 mg/dL — ABNORMAL HIGH (ref 70–99)
Glucose-Capillary: 190 mg/dL — ABNORMAL HIGH (ref 70–99)

## 2023-10-18 LAB — VITAMIN D 25 HYDROXY (VIT D DEFICIENCY, FRACTURES): Vit D, 25-Hydroxy: 22.27 ng/mL — ABNORMAL LOW (ref 30–100)

## 2023-10-18 LAB — PHOSPHORUS: Phosphorus: 3.2 mg/dL (ref 2.5–4.6)

## 2023-10-18 LAB — MAGNESIUM: Magnesium: 2.3 mg/dL (ref 1.7–2.4)

## 2023-10-18 LAB — TRIGLYCERIDES: Triglycerides: 279 mg/dL — ABNORMAL HIGH (ref <150)

## 2023-10-18 MED ORDER — ADULT MULTIVITAMIN W/MINERALS CH
1.0000 | ORAL_TABLET | Freq: Every day | ORAL | Status: DC
  Administered 2023-10-18 – 2023-11-05 (×19): 1
  Filled 2023-10-18 (×17): qty 1

## 2023-10-18 MED ORDER — PROSOURCE TF20 ENFIT COMPATIBL EN LIQD
60.0000 mL | Freq: Every day | ENTERAL | Status: DC
  Administered 2023-10-18 – 2023-11-03 (×15): 60 mL
  Filled 2023-10-18 (×15): qty 60

## 2023-10-18 MED ORDER — CLINIMIX E/DEXTROSE (8/10) 8 % IV SOLN
INTRAVENOUS | Status: AC
  Filled 2023-10-18: qty 1000

## 2023-10-18 MED ORDER — PIVOT 1.5 CAL PO LIQD
1000.0000 mL | ORAL | Status: DC
  Administered 2023-10-18 – 2023-10-23 (×4): 1000 mL

## 2023-10-18 MED ORDER — QUETIAPINE FUMARATE 25 MG PO TABS
50.0000 mg | ORAL_TABLET | Freq: Two times a day (BID) | ORAL | Status: DC
  Administered 2023-10-18 – 2023-10-24 (×13): 50 mg
  Filled 2023-10-18 (×13): qty 2

## 2023-10-18 MED ORDER — CLONAZEPAM 0.5 MG PO TABS
0.5000 mg | ORAL_TABLET | Freq: Two times a day (BID) | ORAL | Status: DC
Start: 2023-10-18 — End: 2023-10-25
  Administered 2023-10-18 – 2023-10-24 (×13): 0.5 mg
  Filled 2023-10-18 (×13): qty 1

## 2023-10-18 NOTE — Progress Notes (Signed)
Orthopaedic Trauma Progress Note  SUBJECTIVE: Intubated and sedated. No acute events overnight  OBJECTIVE:  Vitals:   10/18/23 0800 10/18/23 0900  BP: 120/61 (!) 111/59  Pulse: (!) 104 (!) 108  Resp: 17 16  Temp: 98.6 F (37 C) 98.8 F (37.1 C)  SpO2: 100% 100%    General: Intubated and sedated Respiratory: Stable on vent  Pelvis/BLE: 30 lb skeletal traction from proximal tibia. Significant swelling/bruising over right hip. No skin wrinkling. Unable to obtain reliable motor or sensory exam. Feet warm and well perfused, equal bilaterally. + DP pulse  IMAGING: Stable post op imaging. CT scan pelvis pending.   LABS:  Results for orders placed or performed during the hospital encounter of 10/08/23 (from the past 24 hour(s))  Glucose, capillary     Status: Abnormal   Collection Time: 10/17/23 11:18 AM  Result Value Ref Range   Glucose-Capillary 159 (H) 70 - 99 mg/dL  Glucose, capillary     Status: Abnormal   Collection Time: 10/17/23  3:11 PM  Result Value Ref Range   Glucose-Capillary 186 (H) 70 - 99 mg/dL  Glucose, capillary     Status: Abnormal   Collection Time: 10/17/23  7:52 PM  Result Value Ref Range   Glucose-Capillary 152 (H) 70 - 99 mg/dL  Glucose, capillary     Status: Abnormal   Collection Time: 10/17/23 11:55 PM  Result Value Ref Range   Glucose-Capillary 146 (H) 70 - 99 mg/dL  Glucose, capillary     Status: Abnormal   Collection Time: 10/18/23  3:41 AM  Result Value Ref Range   Glucose-Capillary 136 (H) 70 - 99 mg/dL  Magnesium     Status: None   Collection Time: 10/18/23  5:11 AM  Result Value Ref Range   Magnesium 2.3 1.7 - 2.4 mg/dL  Phosphorus     Status: None   Collection Time: 10/18/23  5:11 AM  Result Value Ref Range   Phosphorus 3.2 2.5 - 4.6 mg/dL  Triglycerides     Status: Abnormal   Collection Time: 10/18/23  5:11 AM  Result Value Ref Range   Triglycerides 279 (H) <150 mg/dL  Glucose, capillary     Status: Abnormal   Collection Time:  10/18/23  7:21 AM  Result Value Ref Range   Glucose-Capillary 190 (H) 70 - 99 mg/dL    ASSESSMENT: [name] [name] is a 62 y.o. male, 1 Day Post-Op s/p PERCUTANEOUS FIXATION OF LEFT/RIGHT POSTERIOR PELVIS PERCUTANEOUS FIXATION OF LEFT SUPERIOR PUBIC RAMUS CLOSED TREATMENT OF RIGHT ACETABULAR FRACTURE  CV/Blood loss: Acute blood loss anemia, Hgb 9.9 pre-op. AM labs pending. Hemodynamically stable  PLAN: Weightbearing:  bedrest   ROM: Maintain traction RLE. Keep traction weight off the ground Incisional and dressing care: Reinforce dressings as needed  Showering: Bed bath Orthopedic device(s):  skeletal traction  RLE Pain management: per trauma VTE prophylaxis: Lovenox, SCDs ID:  Ancef 2gm post op Foley/Lines: Foley in place. Continue IVFs per trauma team Impediments to Fracture Healing: Polytrauma. Vit D level pending, will start supplementation as indicated once able.  Dispo: Continue care per trauma team. Patient will be in traction for an undetermined length of time. At this point, tentatively plan for nonoperative management. We will monitor his soft tissue over the next few days to see if there is any possible way to proceed with limited ORIF for full ORIF. However due to his soft tissue compromise I feel that he would be significantly high risk for infection and may preclude the future  ability to have a total hip arthroplasty. Will review CT scan to further characterize the fracture and traction. I do not foresee any further surgery done prior to next week.   Follow - up plan:  TBD   Contact information:  [name] Merle MD, [name] Breed PA-C. After hours and holidays please check [email or web address] for group call information for [facility]   [name] Caul, PA-C [phone] (office) [email or web address]
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
## 2023-10-19 ENCOUNTER — Inpatient Hospital Stay (HOSPITAL_COMMUNITY): Payer: MEDICAID

## 2023-10-19 LAB — CBC
HCT: 25.1 % — ABNORMAL LOW (ref 39.0–52.0)
Hemoglobin: 8.2 g/dL — ABNORMAL LOW (ref 13.0–17.0)
MCH: 30.6 pg (ref 26.0–34.0)
MCHC: 32.7 g/dL (ref 30.0–36.0)
MCV: 93.7 fL (ref 80.0–100.0)
Platelets: 393 10*3/uL (ref 150–400)
RBC: 2.68 MIL/uL — ABNORMAL LOW (ref 4.22–5.81)
RDW: 19.6 % — ABNORMAL HIGH (ref 11.5–15.5)
WBC: 14.1 10*3/uL — ABNORMAL HIGH (ref 4.0–10.5)
nRBC: 0.4 % — ABNORMAL HIGH (ref 0.0–0.2)

## 2023-10-19 LAB — TYPE AND SCREEN
ABO/RH(D): A POS
Antibody Screen: NEGATIVE
Unit division: 0
Unit division: 0

## 2023-10-19 LAB — BASIC METABOLIC PANEL
Anion gap: 12 (ref 5–15)
BUN: 36 mg/dL — ABNORMAL HIGH (ref 8–23)
CO2: 18 mmol/L — ABNORMAL LOW (ref 22–32)
Calcium: 6.9 mg/dL — ABNORMAL LOW (ref 8.9–10.3)
Chloride: 108 mmol/L (ref 98–111)
Creatinine, Ser: 0.7 mg/dL (ref 0.61–1.24)
GFR, Estimated: >60 mL/min (ref >60)
Glucose, Bld: 148 mg/dL — ABNORMAL HIGH (ref 70–99)
Potassium: 4.5 mmol/L (ref 3.5–5.1)
Sodium: 138 mmol/L (ref 135–145)

## 2023-10-19 LAB — BPAM RBC
Blood Product Expiration Date: 202412132359
Blood Product Expiration Date: 202412142359
ISSUE DATE / TIME: 202411181345
ISSUE DATE / TIME: 202411181345
Unit Type and Rh: 6200
Unit Type and Rh: 6200

## 2023-10-19 LAB — GLUCOSE, CAPILLARY
Glucose-Capillary: 111 mg/dL — ABNORMAL HIGH (ref 70–99)
Glucose-Capillary: 116 mg/dL — ABNORMAL HIGH (ref 70–99)
Glucose-Capillary: 125 mg/dL — ABNORMAL HIGH (ref 70–99)
Glucose-Capillary: 128 mg/dL — ABNORMAL HIGH (ref 70–99)
Glucose-Capillary: 146 mg/dL — ABNORMAL HIGH (ref 70–99)
Glucose-Capillary: 161 mg/dL — ABNORMAL HIGH (ref 70–99)

## 2023-10-19 LAB — TRIGLYCERIDES: Triglycerides: 1217 mg/dL — ABNORMAL HIGH (ref <150)

## 2023-10-19 MED ORDER — DEXMEDETOMIDINE HCL IN NACL 400 MCG/100ML IV SOLN
0.0000 ug/kg/h | INTRAVENOUS | Status: DC
  Administered 2023-10-19: 0.6 ug/kg/h via INTRAVENOUS
  Administered 2023-10-19: 0.4 ug/kg/h via INTRAVENOUS
  Administered 2023-10-20: 1.2 ug/kg/h via INTRAVENOUS
  Administered 2023-10-20: 0.7 ug/kg/h via INTRAVENOUS
  Administered 2023-10-20 – 2023-10-21 (×5): 1.2 ug/kg/h via INTRAVENOUS
  Administered 2023-10-22 (×4): 2 ug/kg/h via INTRAVENOUS
  Administered 2023-10-22: 1.9 ug/kg/h via INTRAVENOUS
  Administered 2023-10-23 – 2023-10-24 (×16): 2 ug/kg/h via INTRAVENOUS
  Administered 2023-10-25: 1.2 ug/kg/h via INTRAVENOUS
  Administered 2023-10-25: 1.5 ug/kg/h via INTRAVENOUS
  Administered 2023-10-25 (×2): 2 ug/kg/h via INTRAVENOUS
  Filled 2023-10-19 (×3): qty 100
  Filled 2023-10-19: qty 200
  Filled 2023-10-19 (×3): qty 100
  Filled 2023-10-19: qty 200
  Filled 2023-10-19: qty 400
  Filled 2023-10-19: qty 100
  Filled 2023-10-19: qty 200
  Filled 2023-10-19 (×4): qty 100
  Filled 2023-10-19: qty 200
  Filled 2023-10-19: qty 100
  Filled 2023-10-19 (×2): qty 300
  Filled 2023-10-19: qty 100
  Filled 2023-10-19: qty 200
  Filled 2023-10-19 (×3): qty 100
  Filled 2023-10-19: qty 200
  Filled 2023-10-19: qty 100

## 2023-10-19 MED ORDER — SODIUM CHLORIDE 0.9 % IV SOLN
2.0000 g | Freq: Three times a day (TID) | INTRAVENOUS | Status: DC
  Administered 2023-10-19 – 2023-10-20 (×2): 2 g via INTRAVENOUS
  Filled 2023-10-19 (×2): qty 12.5

## 2023-10-19 MED ORDER — FUROSEMIDE 10 MG/ML IJ SOLN
40.0000 mg | Freq: Once | INTRAMUSCULAR | Status: AC
  Administered 2023-10-19: 40 mg via INTRAVENOUS
  Filled 2023-10-19: qty 4

## 2023-10-19 MED ORDER — FUROSEMIDE 10 MG/ML IJ SOLN
40.0000 mg | Freq: Four times a day (QID) | INTRAMUSCULAR | Status: AC
  Administered 2023-10-19 (×2): 40 mg via INTRAVENOUS
  Filled 2023-10-19 (×2): qty 4

## 2023-10-19 MED ORDER — IBUPROFEN 100 MG/5ML PO SUSP
600.0000 mg | Freq: Four times a day (QID) | ORAL | Status: DC | PRN
  Administered 2023-10-26 (×2): 600 mg
  Filled 2023-10-19 (×4): qty 30
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
## 2023-10-19 NOTE — Procedures (Signed)
Cortrak  Person Inserting Tube:  Rosemary Pentecost T, RD Tube Type:  Cortrak - 55 inches Tube Size:  10 Tube Location:  Left nare Secured by: Bridle Technique Used to Measure Tube Placement:  Marking at nare/corner of mouth Cortrak Secured At:  99 cm   Cortrak Tube Team Note:  Consult received to place a Cortrak feeding tube.   X-ray is required, abdominal x-ray has been ordered by the Cortrak team. Please confirm tube placement before using the Cortrak tube.   If the tube becomes dislodged please keep the tube and contact the Cortrak team at [email or web address] for replacement.  If after hours and replacement cannot be delayed, place a NG tube and confirm placement with an abdominal x-ray.    Shelle Iron RD, LDN For contact information, refer to [facility].
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
## 2023-10-20 LAB — BASIC METABOLIC PANEL
Anion gap: 8 (ref 5–15)
BUN: 48 mg/dL — ABNORMAL HIGH (ref 8–23)
CO2: 29 mmol/L (ref 22–32)
Calcium: 8.4 mg/dL — ABNORMAL LOW (ref 8.9–10.3)
Chloride: 107 mmol/L (ref 98–111)
Creatinine, Ser: 1.32 mg/dL — ABNORMAL HIGH (ref 0.61–1.24)
GFR, Estimated: >60 mL/min (ref >60)
Glucose, Bld: 195 mg/dL — ABNORMAL HIGH (ref 70–99)
Potassium: 3.9 mmol/L (ref 3.5–5.1)
Sodium: 144 mmol/L (ref 135–145)

## 2023-10-20 LAB — CBC
HCT: 27.3 % — ABNORMAL LOW (ref 39.0–52.0)
Hemoglobin: 8.4 g/dL — ABNORMAL LOW (ref 13.0–17.0)
MCH: 27.7 pg (ref 26.0–34.0)
MCHC: 30.8 g/dL (ref 30.0–36.0)
MCV: 90.1 fL (ref 80.0–100.0)
Platelets: 499 10*3/uL — ABNORMAL HIGH (ref 150–400)
RBC: 3.03 MIL/uL — ABNORMAL LOW (ref 4.22–5.81)
RDW: 18.7 % — ABNORMAL HIGH (ref 11.5–15.5)
WBC: 14.8 10*3/uL — ABNORMAL HIGH (ref 4.0–10.5)
nRBC: 0.2 % (ref 0.0–0.2)

## 2023-10-20 LAB — GLUCOSE, CAPILLARY
Glucose-Capillary: 205 mg/dL — ABNORMAL HIGH (ref 70–99)
Glucose-Capillary: 177 mg/dL — ABNORMAL HIGH (ref 70–99)
Glucose-Capillary: 167 mg/dL — ABNORMAL HIGH (ref 70–99)
Glucose-Capillary: 190 mg/dL — ABNORMAL HIGH (ref 70–99)
Glucose-Capillary: 177 mg/dL — ABNORMAL HIGH (ref 70–99)

## 2023-10-20 LAB — TRIGLYCERIDES: Triglycerides: 219 mg/dL — ABNORMAL HIGH (ref <150)

## 2023-10-20 MED ORDER — FUROSEMIDE 10 MG/ML IJ SOLN
40.0000 mg | Freq: Four times a day (QID) | INTRAMUSCULAR | Status: AC
  Administered 2023-10-20 (×3): 40 mg via INTRAVENOUS
  Filled 2023-10-20 (×3): qty 4

## 2023-10-20 MED ORDER — HYDROMORPHONE HCL-NACL 50-0.9 MG/50ML-% IV SOLN
0.5000 mg/h | INTRAVENOUS | Status: DC
  Administered 2023-10-20: 0.5 mg/h via INTRAVENOUS
  Filled 2023-10-20 (×2): qty 50

## 2023-10-20 MED ORDER — DOCUSATE SODIUM 50 MG/5ML PO LIQD: 100.0000 mg | Freq: Two times a day (BID) | ORAL | Status: DC

## 2023-10-20 MED ORDER — MIDAZOLAM BOLUS VIA INFUSION: 0.0000 mg | INTRAVENOUS | Status: DC | PRN

## 2023-10-20 MED ORDER — HYDROMORPHONE BOLUS VIA INFUSION
0.2500 mg | INTRAVENOUS | Status: DC | PRN
  Administered 2023-10-20: 2 mg via INTRAVENOUS
  Administered 2023-10-21: 1 mg via INTRAVENOUS
  Administered 2023-10-21 (×3): 2 mg via INTRAVENOUS

## 2023-10-20 MED ORDER — MIDAZOLAM-SODIUM CHLORIDE 100-0.9 MG/100ML-% IV SOLN: 0.0000 mg/h | INTRAVENOUS | Status: DC

## 2023-10-20 MED ORDER — SODIUM CHLORIDE 0.9 % IV SOLN
2.0000 g | Freq: Two times a day (BID) | INTRAVENOUS | Status: DC
  Administered 2023-10-20 – 2023-10-21 (×2): 2 g via INTRAVENOUS
  Filled 2023-10-20 (×2): qty 12.5

## 2023-10-20 MED ORDER — SODIUM CHLORIDE 0.9 % IV SOLN
0.5000 mg/h | INTRAVENOUS | Status: DC
  Administered 2023-10-20: 2 mg/h via INTRAVENOUS
  Filled 2023-10-20 (×2): qty 5

## 2023-10-20 MED ORDER — HYDROMORPHONE HCL 1 MG/ML IJ SOLN
1.0000 mg | Freq: Once | INTRAMUSCULAR | Status: AC
  Administered 2023-10-20: 1 mg via INTRAVENOUS
  Filled 2023-10-20: qty 1

## 2023-10-20 MED ORDER — ACETAMINOPHEN 500 MG PO TABS
1000.0000 mg | ORAL_TABLET | Freq: Four times a day (QID) | ORAL | Status: DC
  Administered 2023-10-20 – 2023-11-05 (×58): 1000 mg
  Filled 2023-10-20 (×57): qty 2

## 2023-10-20 MED ORDER — POLYETHYLENE GLYCOL 3350 17 G PO PACK
17.0000 g | PACK | Freq: Every day | ORAL | Status: DC
Start: 2023-10-20 — End: 2023-10-20

## 2023-10-20 NOTE — Plan of Care (Signed)
  Problem: Nutrition: Goal: Adequate nutrition will be maintained Outcome: Progressing   Problem: Clinical Measurements: Goal: Will remain free from infection Outcome: Not Progressing Goal: Respiratory complications will improve Outcome: Not Progressing   Problem: Activity: Goal: Ability to ambulate and perform ADLs will improve Outcome: Not Progressing

## 2023-10-20 NOTE — Progress Notes (Signed)
Trauma/Critical Care Follow Up Note  Subjective:    Overnight Issues:   Objective:  Vital signs for last 24 hours: Temp:  [97.3 F (36.3 C)-102.6 F (39.2 C)] 101.3 F (38.5 C) (11/23 0330) Pulse Rate:  [90-156] 102 (11/23 0330) Resp:  [12-29] 20 (11/23 0330) BP: (94-148)/(54-89) 116/63 (11/23 0330) SpO2:  [97 %-100 %] 100 % (11/23 0330) FiO2 (%):  [40 %] 40 % (11/23 0402) Weight:  [62.9 kg] 62.9 kg (11/23 0500)  Hemodynamic parameters for last 24 hours:    Intake/Output from previous day: 11/22 0701 - 11/23 0700 In: 2464 [I.V.:999; ZO/XW:9604; IV Piggyback:100] Out: 8450 [Urine:6750; Emesis/NG output:1700]  Intake/Output this shift: Total I/O In: 1095.6 [I.V.:410.6; NG/GT:585; IV Piggyback:100] Out: 2950 [Urine:1250; Emesis/NG output:1700]  Vent settings for last 24 hours: Vent Mode: PRVC FiO2 (%):  [40 %] 40 % Set Rate:  [28 bmp] 28 bmp Vt Set:  [510 mL] 510 mL PEEP:  [5 cmH20] 5 cmH20 Pressure Support:  [5 cmH20] 5 cmH20 Plateau Pressure:  [12 cmH20-18 cmH20] 16 cmH20  Physical Exam:  Gen: comfortable, no distress Neuro: sedated on exam HEENT: PERRL Neck: supple CV: RRR Pulm: unlabored breathing on mechanical ventilation-full support Abd: soft, NT    GU: urine clear and yellow, +Foley Extr: wwp, 2+ edema  Results for orders placed or performed during the hospital encounter of 10/08/23 (from the past 24 hour(s))  Culture, Respiratory w Gram Stain     Status: None (Preliminary result)   Collection Time: 10/19/23  6:46 AM   Specimen: Tracheal Aspirate; Respiratory  Result Value Ref Range   Specimen Description TRACHEAL ASPIRATE    Special Requests NONE    Gram Stain      FEW WBC PRESENT, PREDOMINANTLY PMN FEW GRAM VARIABLE ROD Performed at [facility], [address], [place], [place] [address]    Culture PENDING    Report Status PENDING   Glucose, capillary     Status: Abnormal   Collection Time: 10/19/23  7:40 AM  Result Value Ref Range    Glucose-Capillary 128 (H) 70 - 99 mg/dL  Glucose, capillary     Status: Abnormal   Collection Time: 10/19/23 11:40 AM  Result Value Ref Range   Glucose-Capillary 116 (H) 70 - 99 mg/dL  Glucose, capillary     Status: Abnormal   Collection Time: 10/19/23  3:30 PM  Result Value Ref Range   Glucose-Capillary 125 (H) 70 - 99 mg/dL  Glucose, capillary     Status: Abnormal   Collection Time: 10/19/23  8:30 PM  Result Value Ref Range   Glucose-Capillary 161 (H) 70 - 99 mg/dL  Glucose, capillary     Status: Abnormal   Collection Time: 10/19/23 11:49 PM  Result Value Ref Range   Glucose-Capillary 146 (H) 70 - 99 mg/dL  Glucose, capillary     Status: Abnormal   Collection Time: 10/20/23  4:06 AM  Result Value Ref Range   Glucose-Capillary 205 (H) 70 - 99 mg/dL  Basic metabolic panel     Status: Abnormal   Collection Time: 10/20/23  5:04 AM  Result Value Ref Range   Sodium 144 135 - 145 mmol/L   Potassium 3.9 3.5 - 5.1 mmol/L   Chloride 107 98 - 111 mmol/L   CO2 29 22 - 32 mmol/L   Glucose, Bld 195 (H) 70 - 99 mg/dL   BUN 48 (H) 8 - 23 mg/dL   Creatinine, Ser 1.19 (H) 0.61 - 1.24 mg/dL  Calcium 8.4 (L) 8.9 - 10.3 mg/dL   GFR, Estimated >16 >10 mL/min   Anion gap 8 5 - 15  CBC     Status: Abnormal   Collection Time: 10/20/23  5:04 AM  Result Value Ref Range   WBC 14.8 (H) 4.0 - 10.5 K/uL   RBC 3.03 (L) 4.22 - 5.81 MIL/uL   Hemoglobin 8.4 (L) 13.0 - 17.0 g/dL   HCT 96.0 (L) 45.4 - 09.8 %   MCV 90.1 80.0 - 100.0 fL   MCH 27.7 26.0 - 34.0 pg   MCHC 30.8 30.0 - 36.0 g/dL   RDW 11.9 (H) 14.7 - 82.9 %   Platelets 499 (H) 150 - 400 K/uL   nRBC 0.2 0.0 - 0.2 %  Triglycerides     Status: Abnormal   Collection Time: 10/20/23  5:04 AM  Result Value Ref Range   Triglycerides 219 (H) <150 mg/dL    Assessment & Plan:  Present on Admission: **None**    LOS: 12 days   Additional comments:I reviewed the patient's new clinical lab test results.   and I reviewed the patients new  imaging test results.    PHBC   S/P ex lap, pelvic packing (5 laps), and temporary abdominal closure with vicryl mesh by Dr. Dossie Der 11/12 - return to OR 11/13 for ex lap, removal of packs and Abthera placement by Dr. Janee Morn - extensive mesenteric contusions but bowel viable, S/P ex lap and closure 1/18 Dr. Bedelia Person Aortic transsection - Dr. Karin Lieu did TEVAR 11/12 R acetabular FX, B superior and inferior rami FXs, R iliac FX, L sacral FX - Dr. Hulda Humphrey consulted, R femur in skeletal traction, S/P SI screw and L acetab perc fixation 11/20 by Dr. Jena Gauss. REL traction as definitive management Pelvic hemorrhage - packed as above, S/P IR embolization by Dr. Elby Showers 11/12 Urology consulted (Dr. Lafonda Mosses) - no bladder injury found, no ureteral injury Acute hypoxic ventilator dependent respiratory failure - TRALI, 40% and PEEP 5, PSV as tol, extubate when f/c Right gluteal hematoma Right psoas hematoma Right 11th rib fracture Multiple lumbar vertebral body transverse process fractures Right scalp hematoma ABL anemia  ID - fever and rhonchi - started cefepime 11/14--d/c, resp cx negative  FEN - TF via PP cortrak, cont lasix, scheduled Klon/sero VTE - SCDs, LMWH Dispo - ICU  Critical Care Total Time: 45 minutes  Diamantina Monks, MD Trauma & General Surgery Please use [email or web address] to contact on call provider  10/20/2023  *Care during the described time interval was provided by me. I have reviewed this patient's available data, including medical history, events of note, physical examination and test results as part of my evaluation.

## 2023-10-21 LAB — BASIC METABOLIC PANEL
Anion gap: 9 (ref 5–15)
BUN: 45 mg/dL — ABNORMAL HIGH (ref 8–23)
CO2: 32 mmol/L (ref 22–32)
Calcium: 8.6 mg/dL — ABNORMAL LOW (ref 8.9–10.3)
Chloride: 105 mmol/L (ref 98–111)
Creatinine, Ser: 0.89 mg/dL (ref 0.61–1.24)
GFR, Estimated: >60 mL/min (ref >60)
Glucose, Bld: 202 mg/dL — ABNORMAL HIGH (ref 70–99)
Potassium: 3.5 mmol/L (ref 3.5–5.1)
Sodium: 146 mmol/L — ABNORMAL HIGH (ref 135–145)

## 2023-10-21 LAB — CBC
HCT: 30.7 % — ABNORMAL LOW (ref 39.0–52.0)
Hemoglobin: 9.4 g/dL — ABNORMAL LOW (ref 13.0–17.0)
MCH: 27.4 pg (ref 26.0–34.0)
MCHC: 30.6 g/dL (ref 30.0–36.0)
MCV: 89.5 fL (ref 80.0–100.0)
Platelets: 555 10*3/uL — ABNORMAL HIGH (ref 150–400)
RBC: 3.43 MIL/uL — ABNORMAL LOW (ref 4.22–5.81)
RDW: 18.6 % — ABNORMAL HIGH (ref 11.5–15.5)
WBC: 13.5 10*3/uL — ABNORMAL HIGH (ref 4.0–10.5)
nRBC: 0.1 % (ref 0.0–0.2)

## 2023-10-21 LAB — GLUCOSE, CAPILLARY
Glucose-Capillary: 128 mg/dL — ABNORMAL HIGH (ref 70–99)
Glucose-Capillary: 160 mg/dL — ABNORMAL HIGH (ref 70–99)
Glucose-Capillary: 173 mg/dL — ABNORMAL HIGH (ref 70–99)
Glucose-Capillary: 205 mg/dL — ABNORMAL HIGH (ref 70–99)
Glucose-Capillary: 74 mg/dL (ref 70–99)
Glucose-Capillary: 85 mg/dL (ref 70–99)
Glucose-Capillary: 93 mg/dL (ref 70–99)

## 2023-10-21 MED ORDER — NALOXONE HCL 0.4 MG/ML IJ SOLN: 0.4000 mg | INTRAMUSCULAR | Status: DC | PRN

## 2023-10-21 MED ORDER — DIPHENHYDRAMINE HCL 12.5 MG/5ML PO ELIX: 12.5000 mg | ORAL_SOLUTION | Freq: Four times a day (QID) | ORAL | Status: DC | PRN

## 2023-10-21 MED ORDER — SODIUM CHLORIDE 0.9% FLUSH: 9.0000 mL | INTRAVENOUS | Status: DC | PRN

## 2023-10-21 MED ORDER — ONDANSETRON HCL 4 MG/2ML IJ SOLN
4.0000 mg | Freq: Four times a day (QID) | INTRAMUSCULAR | Status: DC | PRN
  Administered 2023-10-23: 4 mg via INTRAVENOUS
  Filled 2023-10-21: qty 2

## 2023-10-21 MED ORDER — DIPHENHYDRAMINE HCL 50 MG/ML IJ SOLN: 12.5000 mg | Freq: Four times a day (QID) | INTRAMUSCULAR | Status: DC | PRN

## 2023-10-21 MED ORDER — ACETAMINOPHEN 650 MG RE SUPP
650.0000 mg | Freq: Four times a day (QID) | RECTAL | Status: DC | PRN
  Administered 2023-10-22: 650 mg via RECTAL
  Filled 2023-10-21: qty 1

## 2023-10-21 MED ORDER — SODIUM CHLORIDE 0.9 % IV SOLN
2.0000 g | Freq: Three times a day (TID) | INTRAVENOUS | Status: DC
  Administered 2023-10-21 – 2023-10-24 (×9): 2 g via INTRAVENOUS
  Filled 2023-10-21 (×9): qty 12.5

## 2023-10-21 MED ORDER — HYDROMORPHONE 1 MG/ML IV SOLN
INTRAVENOUS | Status: DC
  Administered 2023-10-21: 30 mg via INTRAVENOUS
  Administered 2023-10-22: 0.6 mg via INTRAVENOUS
  Administered 2023-10-22: 0.3 mg via INTRAVENOUS
  Administered 2023-10-23: 0.6 mg via INTRAVENOUS

## 2023-10-21 MED ORDER — ORAL CARE MOUTH RINSE
15.0000 mL | OROMUCOSAL | Status: DC
  Administered 2023-10-21 – 2023-10-28 (×28): 15 mL via OROMUCOSAL

## 2023-10-21 NOTE — Progress Notes (Signed)
Pt ejected cortrak into his mouth post intubation.  Cortrak removed

## 2023-10-21 NOTE — Progress Notes (Addendum)
Notified Dr. Cliffton Asters of cortrak removal. Per Dr. Lucilla Lame verbal order, NG tube placement was attempted by 3 different nurses with 6 total attempts. NG continued to coil in patient's mouth with patient seemingly unable to aid in swallowing to facilitate placement. Notified Dr. Cliffton Asters of unsuccessful attempts with orders to hold off for now. Patient resting and comfortable, rates his pain a 1/10. Care ongoing.

## 2023-10-21 NOTE — Progress Notes (Signed)
Trauma/Critical Care Follow Up Note  Subjective:    Overnight Issues:   Objective:  Vital signs for last 24 hours: Temp:  [98.6 F (37 C)-101.8 F (38.8 C)] 98.6 F (37 C) (11/24 0600) Pulse Rate:  [76-113] 85 (11/24 0600) Resp:  [14-30] 28 (11/24 0600) BP: (96-157)/(51-100) 135/82 (11/24 0600) SpO2:  [100 %] 100 % (11/24 0600) FiO2 (%):  [40 %] 40 % (11/24 0436) Weight:  [60.4 kg] 60.4 kg (11/24 0500)  Hemodynamic parameters for last 24 hours:    Intake/Output from previous day: 11/23 0701 - 11/24 0700 In: 2583.2 [I.V.:760.1; NG/GT:1560; IV Piggyback:263.1] Out: 8800 [Urine:8000; Emesis/NG output:800]  Intake/Output this shift: Total I/O In: 1136.6 [I.V.:258.4; NG/GT:715; IV Piggyback:163.1] Out: 2750 [Urine:2550; Emesis/NG output:200]  Vent settings for last 24 hours: Vent Mode: PRVC FiO2 (%):  [40 %] 40 % Set Rate:  [28 bmp] 28 bmp Vt Set:  [510 mL] 510 mL PEEP:  [5 cmH20] 5 cmH20 Pressure Support:  [8 cmH20] 8 cmH20 Plateau Pressure:  [17 cmH20-27 cmH20] 27 cmH20  Physical Exam:  Gen: comfortable, no distress Neuro: follows commands HEENT: PERRL Neck: supple CV: RRR Pulm: unlabored breathing on mechanical ventilation-full support Abd: soft, NT, midline wound with granulation tissue  GU: urine clear and yellow, +Foley Extr: wwp, no edema  Results for orders placed or performed during the hospital encounter of 10/08/23 (from the past 24 hour(s))  Glucose, capillary     Status: Abnormal   Collection Time: 10/20/23  7:50 AM  Result Value Ref Range   Glucose-Capillary 177 (H) 70 - 99 mg/dL  Glucose, capillary     Status: Abnormal   Collection Time: 10/20/23 11:13 AM  Result Value Ref Range   Glucose-Capillary 167 (H) 70 - 99 mg/dL  Glucose, capillary     Status: Abnormal   Collection Time: 10/20/23  3:24 PM  Result Value Ref Range   Glucose-Capillary 190 (H) 70 - 99 mg/dL  Glucose, capillary     Status: Abnormal   Collection Time: 10/20/23  8:13 PM   Result Value Ref Range   Glucose-Capillary 177 (H) 70 - 99 mg/dL  Glucose, capillary     Status: Abnormal   Collection Time: 10/21/23 12:32 AM  Result Value Ref Range   Glucose-Capillary 173 (H) 70 - 99 mg/dL  Glucose, capillary     Status: Abnormal   Collection Time: 10/21/23  4:03 AM  Result Value Ref Range   Glucose-Capillary 128 (H) 70 - 99 mg/dL  Basic metabolic panel     Status: Abnormal   Collection Time: 10/21/23  5:18 AM  Result Value Ref Range   Sodium 146 (H) 135 - 145 mmol/L   Potassium 3.5 3.5 - 5.1 mmol/L   Chloride 105 98 - 111 mmol/L   CO2 32 22 - 32 mmol/L   Glucose, Bld 202 (H) 70 - 99 mg/dL   BUN 45 (H) 8 - 23 mg/dL   Creatinine, Ser 9.51 0.61 - 1.24 mg/dL   Calcium 8.6 (L) 8.9 - 10.3 mg/dL   GFR, Estimated >88 >41 mL/min   Anion gap 9 5 - 15  CBC     Status: Abnormal   Collection Time: 10/21/23  5:18 AM  Result Value Ref Range   WBC 13.5 (H) 4.0 - 10.5 K/uL   RBC 3.43 (L) 4.22 - 5.81 MIL/uL   Hemoglobin 9.4 (L) 13.0 - 17.0 g/dL   HCT 66.0 (L) 63.0 - 16.0 %   MCV 89.5 80.0 - 100.0 fL  MCH 27.4 26.0 - 34.0 pg   MCHC 30.6 30.0 - 36.0 g/dL   RDW 16.0 (H) 73.7 - 10.6 %   Platelets 555 (H) 150 - 400 K/uL   nRBC 0.1 0.0 - 0.2 %    Assessment & Plan: The plan of care was discussed with the bedside nurse for the day, who is in agreement with this plan and no additional concerns were raised.   Present on Admission: **None**    LOS: 13 days   Additional comments:I reviewed the patient's new clinical lab test results.   and I reviewed the patients new imaging test results.    PHBC   S/P ex lap, pelvic packing (5 laps), and temporary abdominal closure with vicryl mesh by Dr. Dossie Der 11/12 - return to OR 11/13 for ex lap, removal of packs and Abthera placement by Dr. Janee Morn - extensive mesenteric contusions but bowel viable, S/P ex lap and closure 11/18 Dr. Bedelia Person Aortic transsection - Dr. Karin Lieu did TEVAR 11/12 R acetabular FX, B superior and  inferior rami FXs, R iliac FX, L sacral FX - Dr. Hulda Humphrey consulted, R femur in skeletal traction, S/P SI screw and L acetab perc fixation 11/20 by Dr. Jena Gauss. REL traction as definitive management Pelvic hemorrhage - packed as above, S/P IR embolization by Dr. Elby Showers 11/12 Urology consulted (Dr. Lafonda Mosses) - no bladder injury found, no ureteral injury Acute hypoxic ventilator dependent respiratory failure - TRALI, 40% and PEEP 5, PSV as tol, possible extubation today Right gluteal hematoma Right psoas hematoma Right 11th rib fracture Multiple lumbar vertebral body transverse process fractures Right scalp hematoma ABL anemia  ID - fever and rhonchi - started cefepime 11/14--d/c, resp cx negative  FEN - TF via PP cortrak, cont lasix, scheduled Klon/sero VTE - SCDs, LMWH Dispo - ICU  Critical Care Total Time: 35 minutes  Diamantina Monks, MD Trauma & General Surgery Please use [email or web address] to contact on call provider  10/21/2023  *Care during the described time interval was provided by me. I have reviewed this patient's available data, including medical history, events of note, physical examination and test results as part of my evaluation.

## 2023-10-21 NOTE — Progress Notes (Signed)
20ml dilaudid from pharmacy wasted with Ashok Cordia, RN

## 2023-10-21 NOTE — Procedures (Signed)
Extubation Procedure Note  Patient Details:   Name: Khair Viesca DOB: [birth year] MRN: [id number]   Airway Documentation:    Vent end date: 10/21/23 Vent end time: 1229   Evaluation  O2 sats: stable throughout Complications: No apparent complications Patient did tolerate procedure well. Bilateral Breath Sounds: Clear, Diminished   Yes  Pt extubated to 2L [place] per MD order. Pt tolerated well, cuff leak positive, no stridor noted, RN at bedside, MD aware, RT will monitor as needed.   Thornell Mule 10/21/2023, 12:41 PM

## 2023-10-22 ENCOUNTER — Inpatient Hospital Stay (HOSPITAL_COMMUNITY): Payer: MEDICAID

## 2023-10-22 LAB — BASIC METABOLIC PANEL
Anion gap: 8 (ref 5–15)
BUN: 41 mg/dL — ABNORMAL HIGH (ref 8–23)
CO2: 27 mmol/L (ref 22–32)
Calcium: 8.4 mg/dL — ABNORMAL LOW (ref 8.9–10.3)
Chloride: 114 mmol/L — ABNORMAL HIGH (ref 98–111)
Creatinine, Ser: 0.79 mg/dL (ref 0.61–1.24)
GFR, Estimated: >60 mL/min (ref >60)
Glucose, Bld: 132 mg/dL — ABNORMAL HIGH (ref 70–99)
Potassium: 3.3 mmol/L — ABNORMAL LOW (ref 3.5–5.1)
Sodium: 149 mmol/L — ABNORMAL HIGH (ref 135–145)

## 2023-10-22 LAB — GLUCOSE, CAPILLARY
Glucose-Capillary: 146 mg/dL — ABNORMAL HIGH (ref 70–99)
Glucose-Capillary: 117 mg/dL — ABNORMAL HIGH (ref 70–99)
Glucose-Capillary: 174 mg/dL — ABNORMAL HIGH (ref 70–99)
Glucose-Capillary: 55 mg/dL — ABNORMAL LOW (ref 70–99)
Glucose-Capillary: 216 mg/dL — ABNORMAL HIGH (ref 70–99)
Glucose-Capillary: 204 mg/dL — ABNORMAL HIGH (ref 70–99)
Glucose-Capillary: 159 mg/dL — ABNORMAL HIGH (ref 70–99)

## 2023-10-22 MED ORDER — FREE WATER
200.0000 mL | Freq: Three times a day (TID) | Status: DC
  Administered 2023-10-22 – 2023-10-25 (×5): 200 mL

## 2023-10-22 MED ORDER — DEXTROSE 50 % IV SOLN
INTRAVENOUS | Status: AC
  Filled 2023-10-22: qty 50

## 2023-10-22 MED ORDER — POTASSIUM CHLORIDE 20 MEQ PO PACK
40.0000 meq | PACK | Freq: Once | ORAL | Status: AC
  Administered 2023-10-22: 40 meq
  Filled 2023-10-22: qty 2

## 2023-10-22 MED ORDER — DEXTROSE 50 % IV SOLN
12.5000 g | INTRAVENOUS | Status: AC
  Administered 2023-10-22: 12.5 g via INTRAVENOUS

## 2023-10-22 MED ORDER — KETAMINE HCL 50 MG/5ML IJ SOSY
50.0000 mg | PREFILLED_SYRINGE | Freq: Once | INTRAMUSCULAR | Status: DC
Start: 2023-10-22 — End: 2023-10-22
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
## 2023-10-22 NOTE — Procedures (Signed)
Cortrak  Person Inserting Tube:  Greig Castilla D, RD Tube Type:  Cortrak - 55 inches Tube Location:  Left nare Secured by: Bridle Technique Used to Measure Tube Placement:  Marking at nare/corner of mouth Cortrak Secured At:  99 cm Procedure Comments:  Cortrak Tube Team Note:  Consult received to place a Cortrak feeding tube.   X-ray is required, abdominal x-ray has been ordered by the Cortrak team. Please confirm tube placement before using the Cortrak tube.   If the tube becomes dislodged please keep the tube and contact the Cortrak team at [email or web address] for replacement.  If after hours and replacement cannot be delayed, place a NG tube and confirm placement with an abdominal x-ray.    Greig Castilla, RD, LDN Registered Dietitian II RD pager # available in AMION  After hours/weekend pager # available in [facility]
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
## 2023-10-22 NOTE — Progress Notes (Addendum)
[facility]  Subjective  - Extubated, does not respond or open eyes   Objective (!) 141/96 79 99.9 F (37.7 C) (!) 36 100%  Intake/Output Summary (Last 24 hours) at 10/22/2023 5284 Last data filed at 10/22/2023 0400 Gross per 24 hour  Intake 1052.83 ml  Output 1965 ml  Net -912.17 ml    Lungs [place] supported O2 non labored breathing Palpable pedal pulses and radial pulses B UE Right groin soft without hematoma  Assessment/Planning: 62 y.o. male is s/p TEVAR  Stable perfusion to all extremities Will see again on Monday   Mosetta Pigeon 10/22/2023 6:57 AM --  Laboratory Lab Results: Recent Labs    10/20/23 0504 10/21/23 0518  WBC 14.8* 13.5*  HGB 8.4* 9.4*  HCT 27.3* 30.7*  PLT 499* 555*   BMET Recent Labs    10/21/23 0518 10/22/23 0534  NA 146* 149*  K 3.5 3.3*  CL 105 114*  CO2 32 27  GLUCOSE 202* 132*  BUN 45* 41*  CREATININE 0.89 0.79  CALCIUM 8.6* 8.4*    COAG Lab Results  Component Value Date   INR 1.5 (H) 10/09/2023   No results found for: "PTT"

## 2023-10-23 ENCOUNTER — Institutional Professional Consult (permissible substitution) (HOSPITAL_BASED_OUTPATIENT_CLINIC_OR_DEPARTMENT_OTHER): Payer: 59 | Admitting: Pulmonary Disease

## 2023-10-23 ENCOUNTER — Inpatient Hospital Stay (HOSPITAL_COMMUNITY): Payer: MEDICAID

## 2023-10-23 LAB — BASIC METABOLIC PANEL
Anion gap: 9 (ref 5–15)
BUN: 43 mg/dL — ABNORMAL HIGH (ref 8–23)
CO2: 28 mmol/L (ref 22–32)
Calcium: 8.9 mg/dL (ref 8.9–10.3)
Chloride: 116 mmol/L — ABNORMAL HIGH (ref 98–111)
Creatinine, Ser: 1.05 mg/dL (ref 0.61–1.24)
GFR, Estimated: >60 mL/min (ref >60)
Glucose, Bld: 143 mg/dL — ABNORMAL HIGH (ref 70–99)
Potassium: 3.7 mmol/L (ref 3.5–5.1)
Sodium: 153 mmol/L — ABNORMAL HIGH (ref 135–145)

## 2023-10-23 LAB — GLUCOSE, CAPILLARY
Glucose-Capillary: 156 mg/dL — ABNORMAL HIGH (ref 70–99)
Glucose-Capillary: 62 mg/dL — ABNORMAL LOW (ref 70–99)
Glucose-Capillary: 112 mg/dL — ABNORMAL HIGH (ref 70–99)
Glucose-Capillary: 155 mg/dL — ABNORMAL HIGH (ref 70–99)
Glucose-Capillary: 133 mg/dL — ABNORMAL HIGH (ref 70–99)
Glucose-Capillary: 88 mg/dL (ref 70–99)

## 2023-10-23 LAB — CBC
HCT: 36.7 % — ABNORMAL LOW (ref 39.0–52.0)
Hemoglobin: 11.1 g/dL — ABNORMAL LOW (ref 13.0–17.0)
MCH: 26.9 pg (ref 26.0–34.0)
MCHC: 30.2 g/dL (ref 30.0–36.0)
MCV: 88.9 fL (ref 80.0–100.0)
Platelets: 789 10*3/uL — ABNORMAL HIGH (ref 150–400)
RBC: 4.13 MIL/uL — ABNORMAL LOW (ref 4.22–5.81)
RDW: 18.5 % — ABNORMAL HIGH (ref 11.5–15.5)
WBC: 14 10*3/uL — ABNORMAL HIGH (ref 4.0–10.5)
nRBC: 0.6 % — ABNORMAL HIGH (ref 0.0–0.2)

## 2023-10-23 MED ORDER — KCL IN DEXTROSE-NACL 20-5-0.45 MEQ/L-%-% IV SOLN
INTRAVENOUS | Status: AC
  Filled 2023-10-23 (×2): qty 1000

## 2023-10-23 MED ORDER — METOCLOPRAMIDE HCL 5 MG/5ML PO SOLN
10.0000 mg | Freq: Four times a day (QID) | ORAL | Status: AC
  Administered 2023-10-23 – 2023-11-01 (×36): 10 mg
  Filled 2023-10-23 (×38): qty 10

## 2023-10-23 MED ORDER — HYDROMORPHONE HCL 1 MG/ML IJ SOLN
0.5000 mg | INTRAMUSCULAR | Status: DC | PRN
  Administered 2023-10-24 – 2023-11-06 (×26): 0.5 mg via INTRAVENOUS
  Filled 2023-10-23 (×8): qty 0.5
  Filled 2023-10-23: qty 1
  Filled 2023-10-23 (×2): qty 0.5
  Filled 2023-10-23: qty 1
  Filled 2023-10-23 (×15): qty 0.5

## 2023-10-23 NOTE — Progress Notes (Signed)
25mL of dilaudid wasted in stericycle witnessed by Talmage Coin RN following PCA discontinuance.
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
## 2023-10-23 NOTE — Inpatient Diabetes Management (Signed)
Inpatient Diabetes Program Recommendations  AACE/ADA: New Consensus Statement on Inpatient Glycemic Control (2015)  Target Ranges:  Prepandial:   less than 140 mg/dL      Peak postprandial:   less than 180 mg/dL (1-2 hours)      Critically ill patients:  140 - 180 mg/dL   Lab Results  Component Value Date   GLUCAP 155 (H) 10/23/2023    Review of Glycemic Control  Latest Reference Range & Units 10/22/23 23:04 10/23/23 03:03 10/23/23 07:53 10/23/23 09:21 10/23/23 11:05  Glucose-Capillary 70 - 99 mg/dL 604 (H) 540 (H) 62 (L) 112 (H) 155 (H)  (H): Data is abnormally high (L): Data is abnormally low  Diabetes history: None Outpatient Diabetes medications: None Current orders for Inpatient glycemic control: Novolog 0-9 units Q4H  Inpatient Diabetes Program Recommendations:    Having occasional mild lows.  Might consider:  Novolog 0-6 units Q4H.  Will continue to follow while inpatient.  Thank you, Dulce Sellar, MSN, CDCES Diabetes Coordinator Inpatient Diabetes Program [phone] (team pager from 8a-5p)
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
## 2023-10-24 ENCOUNTER — Inpatient Hospital Stay (HOSPITAL_COMMUNITY): Payer: MEDICAID

## 2023-10-24 LAB — CBC
HCT: 34.2 % — ABNORMAL LOW (ref 39.0–52.0)
Hemoglobin: 10.6 g/dL — ABNORMAL LOW (ref 13.0–17.0)
MCH: 27.5 pg (ref 26.0–34.0)
MCHC: 31 g/dL (ref 30.0–36.0)
MCV: 88.6 fL (ref 80.0–100.0)
Platelets: 711 10*3/uL — ABNORMAL HIGH (ref 150–400)
RBC: 3.86 MIL/uL — ABNORMAL LOW (ref 4.22–5.81)
RDW: 18.5 % — ABNORMAL HIGH (ref 11.5–15.5)
WBC: 13.2 10*3/uL — ABNORMAL HIGH (ref 4.0–10.5)
nRBC: 0.6 % — ABNORMAL HIGH (ref 0.0–0.2)

## 2023-10-24 LAB — GLUCOSE, CAPILLARY
Glucose-Capillary: 100 mg/dL — ABNORMAL HIGH (ref 70–99)
Glucose-Capillary: 111 mg/dL — ABNORMAL HIGH (ref 70–99)
Glucose-Capillary: 129 mg/dL — ABNORMAL HIGH (ref 70–99)
Glucose-Capillary: 135 mg/dL — ABNORMAL HIGH (ref 70–99)
Glucose-Capillary: 69 mg/dL — ABNORMAL LOW (ref 70–99)
Glucose-Capillary: 83 mg/dL (ref 70–99)
Glucose-Capillary: 90 mg/dL (ref 70–99)

## 2023-10-24 LAB — BASIC METABOLIC PANEL
Anion gap: 12 (ref 5–15)
BUN: 30 mg/dL — ABNORMAL HIGH (ref 8–23)
CO2: 23 mmol/L (ref 22–32)
Calcium: 8.5 mg/dL — ABNORMAL LOW (ref 8.9–10.3)
Chloride: 116 mmol/L — ABNORMAL HIGH (ref 98–111)
Creatinine, Ser: 1.19 mg/dL (ref 0.61–1.24)
GFR, Estimated: >60 mL/min (ref >60)
Glucose, Bld: 209 mg/dL — ABNORMAL HIGH (ref 70–99)
Potassium: 3.5 mmol/L (ref 3.5–5.1)
Sodium: 151 mmol/L — ABNORMAL HIGH (ref 135–145)

## 2023-10-24 LAB — CULTURE, BLOOD (ROUTINE X 2)
Culture: NO GROWTH
Culture: NO GROWTH
Special Requests: ADEQUATE

## 2023-10-24 LAB — CULTURE, RESPIRATORY W GRAM STAIN

## 2023-10-24 MED ORDER — PIVOT 1.5 CAL PO LIQD
1000.0000 mL | ORAL | Status: DC
  Administered 2023-10-24: 1000 mL

## 2023-10-24 MED ORDER — DEXTROSE 50 % IV SOLN
12.5000 g | INTRAVENOUS | Status: AC
  Administered 2023-10-24: 12.5 g via INTRAVENOUS

## 2023-10-24 MED ORDER — SODIUM CHLORIDE 0.9 % IV SOLN
2.0000 g | Freq: Two times a day (BID) | INTRAVENOUS | Status: AC
  Administered 2023-10-24 – 2023-10-26 (×5): 2 g via INTRAVENOUS
  Filled 2023-10-24 (×5): qty 12.5

## 2023-10-24 MED ORDER — DEXTROSE 50 % IV SOLN
INTRAVENOUS | Status: AC
  Filled 2023-10-24: qty 50

## 2023-10-24 MED ORDER — GUAIFENESIN 100 MG/5ML PO LIQD
5.0000 mL | Freq: Four times a day (QID) | ORAL | Status: AC
Start: 2023-10-24 — End: 2023-10-26
  Administered 2023-10-24 – 2023-10-26 (×8): 5 mL via ORAL
  Filled 2023-10-24 (×8): qty 15
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
## 2023-10-25 LAB — PHOSPHORUS: Phosphorus: 3.3 mg/dL (ref 2.5–4.6)

## 2023-10-25 LAB — GLUCOSE, CAPILLARY
Glucose-Capillary: 96 mg/dL (ref 70–99)
Glucose-Capillary: 145 mg/dL — ABNORMAL HIGH (ref 70–99)
Glucose-Capillary: 171 mg/dL — ABNORMAL HIGH (ref 70–99)
Glucose-Capillary: 137 mg/dL — ABNORMAL HIGH (ref 70–99)
Glucose-Capillary: 117 mg/dL — ABNORMAL HIGH (ref 70–99)

## 2023-10-25 LAB — HEPATIC FUNCTION PANEL
ALT: 45 U/L — ABNORMAL HIGH (ref 0–44)
AST: 47 U/L — ABNORMAL HIGH (ref 15–41)
Albumin: 2.4 g/dL — ABNORMAL LOW (ref 3.5–5.0)
Alkaline Phosphatase: 127 U/L — ABNORMAL HIGH (ref 38–126)
Bilirubin, Direct: 1 mg/dL — ABNORMAL HIGH (ref 0.0–0.2)
Indirect Bilirubin: 1.5 mg/dL — ABNORMAL HIGH (ref 0.3–0.9)
Total Bilirubin: 2.5 mg/dL — ABNORMAL HIGH (ref <1.2)
Total Protein: 7.1 g/dL (ref 6.5–8.1)

## 2023-10-25 LAB — CBC
HCT: 34.7 % — ABNORMAL LOW (ref 39.0–52.0)
Hemoglobin: 10.6 g/dL — ABNORMAL LOW (ref 13.0–17.0)
MCH: 27 pg (ref 26.0–34.0)
MCHC: 30.5 g/dL (ref 30.0–36.0)
MCV: 88.3 fL (ref 80.0–100.0)
Platelets: 626 10*3/uL — ABNORMAL HIGH (ref 150–400)
RBC: 3.93 MIL/uL — ABNORMAL LOW (ref 4.22–5.81)
RDW: 18.4 % — ABNORMAL HIGH (ref 11.5–15.5)
WBC: 11.6 10*3/uL — ABNORMAL HIGH (ref 4.0–10.5)
nRBC: 1.1 % — ABNORMAL HIGH (ref 0.0–0.2)

## 2023-10-25 LAB — BASIC METABOLIC PANEL
Anion gap: 6 (ref 5–15)
BUN: 28 mg/dL — ABNORMAL HIGH (ref 8–23)
CO2: 23 mmol/L (ref 22–32)
Calcium: 8.1 mg/dL — ABNORMAL LOW (ref 8.9–10.3)
Chloride: 120 mmol/L — ABNORMAL HIGH (ref 98–111)
Creatinine, Ser: 1.07 mg/dL (ref 0.61–1.24)
GFR, Estimated: >60 mL/min (ref >60)
Glucose, Bld: 140 mg/dL — ABNORMAL HIGH (ref 70–99)
Potassium: 3.3 mmol/L — ABNORMAL LOW (ref 3.5–5.1)
Sodium: 149 mmol/L — ABNORMAL HIGH (ref 135–145)

## 2023-10-25 MED ORDER — ONDANSETRON HCL 4 MG/2ML IJ SOLN
INTRAMUSCULAR | Status: AC
  Filled 2023-10-25: qty 2

## 2023-10-25 MED ORDER — POTASSIUM PHOSPHATES 15 MMOLE/5ML IV SOLN
30.0000 mmol | Freq: Once | INTRAVENOUS | Status: AC
  Administered 2023-10-25: 30 mmol via INTRAVENOUS
  Filled 2023-10-25: qty 10

## 2023-10-25 MED ORDER — SODIUM CHLORIDE 0.9 % IV SOLN: 250.0000 mL | INTRAVENOUS | Status: AC

## 2023-10-25 MED ORDER — QUETIAPINE FUMARATE 100 MG PO TABS
100.0000 mg | ORAL_TABLET | Freq: Two times a day (BID) | ORAL | Status: DC
  Administered 2023-10-25 – 2023-10-29 (×9): 100 mg
  Filled 2023-10-25 (×9): qty 1

## 2023-10-25 MED ORDER — NOREPINEPHRINE 4 MG/250ML-% IV SOLN: 1.0000 ug/min | INTRAVENOUS | Status: DC

## 2023-10-25 MED ORDER — CLONAZEPAM 1 MG PO TABS
1.0000 mg | ORAL_TABLET | Freq: Two times a day (BID) | ORAL | Status: DC
  Administered 2023-10-25 – 2023-10-29 (×9): 1 mg
  Filled 2023-10-25 (×9): qty 1

## 2023-10-25 MED ORDER — FREE WATER
200.0000 mL | Status: DC
  Administered 2023-10-25 – 2023-10-27 (×12): 200 mL

## 2023-10-25 MED ORDER — ONDANSETRON HCL 4 MG/2ML IJ SOLN
4.0000 mg | Freq: Three times a day (TID) | INTRAMUSCULAR | Status: DC | PRN
  Administered 2023-10-25 – 2023-11-09 (×7): 4 mg via INTRAVENOUS
  Filled 2023-10-25 (×6): qty 2

## 2023-10-25 MED ORDER — CHLORHEXIDINE GLUCONATE CLOTH 2 % EX PADS
6.0000 | MEDICATED_PAD | Freq: Every day | CUTANEOUS | Status: DC
  Administered 2023-10-27 – 2023-11-07 (×12): 6 via TOPICAL

## 2023-10-25 MED ORDER — PIVOT 1.5 CAL PO LIQD
1000.0000 mL | ORAL | Status: DC
  Administered 2023-10-25 (×2): 1000 mL

## 2023-10-25 MED ORDER — NOREPINEPHRINE 4 MG/250ML-% IV SOLN: 2.0000 ug/min | INTRAVENOUS | Status: DC

## 2023-10-25 MED ORDER — SODIUM CHLORIDE 0.9 % IV BOLUS
1000.0000 mL | Freq: Once | INTRAVENOUS | Status: AC
Start: 2023-10-25 — End: 2023-10-25
  Administered 2023-10-25: 1000 mL via INTRAVENOUS
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
## 2023-10-25 NOTE — Progress Notes (Signed)
Patient ID: [name] [name], male   DOB: Feb 03, 1961, 62 y.o.   MRN: [id number] Follow up - Trauma Critical Care   Patient Details:    [name] [name] is an 62 y.o. male.  Lines/tubes : External Urinary Catheter (Active)  Dedicated Suction Verified suction is between 40-80 mmHg 10/22/23 2000  Site Assessment Clean, Dry, Intact 10/22/23 2000  Intervention No interventions needed at this time 10/22/23 2000    Microbiology/Sepsis markers: Results for orders placed or performed during the hospital encounter of 10/08/23  MRSA Next Gen by PCR, Nasal     Status: None   Collection Time: 10/09/23  3:07 AM   Specimen: Nasal Mucosa; Nasal Swab  Result Value Ref Range Status   MRSA by PCR Next Gen NOT DETECTED NOT DETECTED Final    Comment: (NOTE) The GeneXpert MRSA Assay (FDA approved for NASAL specimens only), is one component of a comprehensive MRSA colonization surveillance program. It is not intended to diagnose MRSA infection nor to guide or monitor treatment for MRSA infections. Test performance is not FDA approved in patients less than 25 years old. Performed at [facility], [address] [place], [place] [address]   Culture, Respiratory w Gram Stain     Status: None   Collection Time: 10/11/23 10:09 AM   Specimen: Tracheal Aspirate; Respiratory  Result Value Ref Range Status   Specimen Description TRACHEAL ASPIRATE  Final   Special Requests NONE  Final   Gram Stain   Final    FEW WBC PRESENT, PREDOMINANTLY PMN NO ORGANISMS SEEN    Culture   Final    RARE Normal respiratory flora-no Staph aureus or Pseudomonas seen Performed at [facility], [address] [place], [place] [address]    Report Status 10/14/2023 FINAL  Final  Culture, Respiratory w Gram Stain     Status: None   Collection Time: 10/19/23  6:46 AM   Specimen: Tracheal Aspirate; Respiratory  Result Value Ref Range Status   Specimen Description TRACHEAL ASPIRATE  Final   Special Requests NONE  Final    Gram Stain   Final    FEW WBC PRESENT, PREDOMINANTLY PMN FEW GRAM VARIABLE ROD Performed at [facility], [address] [place], [place] [address]    Culture   Final    RARE ENTEROBACTER CLOACAE RARE KLEBSIELLA PNEUMONIAE    Report Status 10/24/2023 FINAL  Final   Organism ID, Bacteria ENTEROBACTER CLOACAE  Final   Organism ID, Bacteria KLEBSIELLA PNEUMONIAE  Final      Susceptibility   Enterobacter cloacae - MIC*    CEFEPIME <=0.12 SENSITIVE Sensitive     CEFTAZIDIME <=1 SENSITIVE Sensitive     CIPROFLOXACIN <=0.25 SENSITIVE Sensitive     GENTAMICIN <=1 SENSITIVE Sensitive     IMIPENEM <=0.25 SENSITIVE Sensitive     TRIMETH/SULFA <=20 SENSITIVE Sensitive     PIP/TAZO <=4 SENSITIVE Sensitive ug/mL    * RARE ENTEROBACTER CLOACAE   Klebsiella pneumoniae - MIC*    AMPICILLIN RESISTANT Resistant     CEFEPIME <=0.12 SENSITIVE Sensitive     CEFTAZIDIME <=1 SENSITIVE Sensitive     CEFTRIAXONE <=0.25 SENSITIVE Sensitive     CIPROFLOXACIN <=0.25 SENSITIVE Sensitive     GENTAMICIN <=1 SENSITIVE Sensitive     IMIPENEM 0.5 SENSITIVE Sensitive     TRIMETH/SULFA <=20 SENSITIVE Sensitive     AMPICILLIN/SULBACTAM <=2 SENSITIVE Sensitive     PIP/TAZO <=4 SENSITIVE Sensitive ug/mL    * RARE KLEBSIELLA PNEUMONIAE  Culture,  blood (Routine X 2) w Reflex to ID Panel     Status: None   Collection Time: 10/19/23 10:02 AM   Specimen: BLOOD LEFT ARM  Result Value Ref Range Status   Specimen Description BLOOD LEFT ARM  Final   Special Requests   Final    Blood Culture results may not be optimal due to an inadequate volume of blood received in culture bottles   Culture   Final    NO GROWTH 5 DAYS Performed at [facility], [name], [place], [place] [address]    Report Status 10/24/2023 FINAL  Final  Culture, blood (Routine X 2) w Reflex to ID Panel     Status: None   Collection Time: 10/19/23 10:04 AM   Specimen: BLOOD LEFT HAND  Result Value Ref Range Status   Specimen  Description BLOOD LEFT HAND  Final   Special Requests Blood Culture adequate volume  Final   Culture   Final    NO GROWTH 5 DAYS Performed at [facility], [address], [place], [place] [address]    Report Status 10/24/2023 FINAL  Final    Anti-infectives:  Anti-infectives (From admission, onward)    Start     Dose/Rate Route Frequency Ordered Stop   10/24/23 1800  ceFEPIme (MAXIPIME) 2 g in sodium chloride 0.9 % 100 mL IVPB        2 g 200 mL/hr over 30 Minutes Intravenous Every 12 hours 10/24/23 1108 10/27/23 0559   10/21/23 1400  ceFEPIme (MAXIPIME) 2 g in sodium chloride 0.9 % 100 mL IVPB  Status:  Discontinued        2 g 200 mL/hr over 30 Minutes Intravenous Every 8 hours 10/21/23 0844 10/24/23 1108   10/20/23 1900  ceFEPIme (MAXIPIME) 2 g in sodium chloride 0.9 % 100 mL IVPB  Status:  Discontinued        2 g 200 mL/hr over 30 Minutes Intravenous Every 12 hours 10/20/23 0912 10/21/23 0844   10/19/23 2300  ceFEPIme (MAXIPIME) 2 g in sodium chloride 0.9 % 100 mL IVPB  Status:  Discontinued        2 g 200 mL/hr over 30 Minutes Intravenous Every 8 hours 10/19/23 2157 10/20/23 0912   10/17/23 2200  ceFAZolin (ANCEF) IVPB 2g/100 mL premix        2 g 200 mL/hr over 30 Minutes Intravenous Every 8 hours 10/17/23 1541 10/18/23 1417   10/12/23 1745  ceFEPIme (MAXIPIME) 2 g in sodium chloride 0.9 % 100 mL IVPB  Status:  Discontinued        2 g 200 mL/hr over 30 Minutes Intravenous Every 8 hours 10/12/23 1319 10/14/23 1037   10/11/23 0945  ceFEPIme (MAXIPIME) 2 g in sodium chloride 0.9 % 100 mL IVPB  Status:  Discontinued        2 g 200 mL/hr over 30 Minutes Intravenous Every 12 hours 10/11/23 0846 10/12/23 1319        Consults: Treatment Team:  Roby Lofts, MD Victorino Sparrow, MD    Studies:    Events:  Subjective:    Overnight Issues: Tolerating trickle feeds, no vomiting. Remains restless, on precedex at 2. Having bowel function.  Objective:  Vital  signs for last 24 hours: Temp:  [98.8 F (37.1 C)-100.7 F (38.2 C)] 99 F (37.2 C) (11/28 0800) Pulse Rate:  [66-119] 74 (11/28 0700) Resp:  [24-39] 28 (11/28 0700) BP: (116-159)/(62-93) 116/72 (11/28 0700) SpO2:  [94 %-  100 %] 98 % (11/28 0700) Weight:  [54.5 kg] 54.5 kg (11/28 0500)  Hemodynamic parameters for last 24 hours:    Intake/Output from previous day: 11/27 0701 - 11/28 0700 In: 1611.6 [I.V.:758.9; NG/GT:625; IV Piggyback:227.7] Out: 1580 [Urine:1380; Emesis/NG output:200]  Intake/Output this shift: Total I/O In: 200 [NG/GT:200] Out: -   Vent settings for last 24 hours:    Physical Exam:  General: sedated on precedex HEENT/Neck: cortrak and NGT, NG bilious, no tube feeds Resp: nonlabored respirations CVS: regular rate GI: soft, NT, wound dressing clean/dry/intact Extremities: RLQ traction  Results for orders placed or performed during the hospital encounter of 10/08/23 (from the past 24 hour(s))  Glucose, capillary     Status: Abnormal   Collection Time: 10/24/23 11:21 AM  Result Value Ref Range   Glucose-Capillary 111 (H) 70 - 99 mg/dL  Glucose, capillary     Status: None   Collection Time: 10/24/23  3:28 PM  Result Value Ref Range   Glucose-Capillary 90 70 - 99 mg/dL  Glucose, capillary     Status: Abnormal   Collection Time: 10/24/23  7:47 PM  Result Value Ref Range   Glucose-Capillary 100 (H) 70 - 99 mg/dL  Glucose, capillary     Status: Abnormal   Collection Time: 10/24/23 11:34 PM  Result Value Ref Range   Glucose-Capillary 135 (H) 70 - 99 mg/dL  Glucose, capillary     Status: None   Collection Time: 10/25/23  3:32 AM  Result Value Ref Range   Glucose-Capillary 96 70 - 99 mg/dL  CBC     Status: Abnormal   Collection Time: 10/25/23  5:20 AM  Result Value Ref Range   WBC 11.6 (H) 4.0 - 10.5 K/uL   RBC 3.93 (L) 4.22 - 5.81 MIL/uL   Hemoglobin 10.6 (L) 13.0 - 17.0 g/dL   HCT 09.8 (L) 11.9 - 14.7 %   MCV 88.3 80.0 - 100.0 fL   MCH 27.0 26.0 -  34.0 pg   MCHC 30.5 30.0 - 36.0 g/dL   RDW 82.9 (H) 56.2 - 13.0 %   Platelets 626 (H) 150 - 400 K/uL   nRBC 1.1 (H) 0.0 - 0.2 %  Hepatic function panel     Status: Abnormal   Collection Time: 10/25/23  5:20 AM  Result Value Ref Range   Total Protein 7.1 6.5 - 8.1 g/dL   Albumin 2.4 (L) 3.5 - 5.0 g/dL   AST 47 (H) 15 - 41 U/L   ALT 45 (H) 0 - 44 U/L   Alkaline Phosphatase 127 (H) 38 - 126 U/L   Total Bilirubin 2.5 (H) <1.2 mg/dL   Bilirubin, Direct 1.0 (H) 0.0 - 0.2 mg/dL   Indirect Bilirubin 1.5 (H) 0.3 - 0.9 mg/dL  Basic metabolic panel     Status: Abnormal   Collection Time: 10/25/23  5:20 AM  Result Value Ref Range   Sodium 149 (H) 135 - 145 mmol/L   Potassium 3.3 (L) 3.5 - 5.1 mmol/L   Chloride 120 (H) 98 - 111 mmol/L   CO2 23 22 - 32 mmol/L   Glucose, Bld 140 (H) 70 - 99 mg/dL   BUN 28 (H) 8 - 23 mg/dL   Creatinine, Ser 8.65 0.61 - 1.24 mg/dL   Calcium 8.1 (L) 8.9 - 10.3 mg/dL   GFR, Estimated >78 >46 mL/min   Anion gap 6 5 - 15  Glucose, capillary     Status: Abnormal   Collection Time: 10/25/23  7:22 AM  Result Value Ref Range   Glucose-Capillary 145 (H) 70 - 99 mg/dL    Assessment & Plan: Present on Admission: **None**    LOS: 17 days   Additional comments:None PHBC   S/P ex lap, pelvic packing (5 laps), and temporary abdominal closure with vicryl mesh by Dr. Dossie Der 11/12 - return to OR 11/13 for ex lap, removal of packs and Abthera placement by Dr. Janee Morn - extensive mesenteric contusions but bowel viable, S/P ex lap and closure 11/18 Dr. Bedelia Person Aortic transsection - Dr. Karin Lieu did TEVAR 11/12 R acetabular FX, B superior and inferior rami FXs, R iliac FX, L sacral FX - Dr. Hulda Humphrey consulted, R femur in skeletal traction, S/P SI screw and L acetab perc fixation 11/20 by Dr. Jena Gauss. RLE traction as definitive management Pelvic hemorrhage - packed as above, S/P IR embolization by Dr. Elby Showers 11/12 Urology consulted (Dr. Lafonda Mosses) - no bladder injury found, no  ureteral injury Acute hypoxic respiratory failure - TRALI, extubated 11/24, guaifenesin for secretions. Right gluteal hematoma Right psoas hematoma Right 11th rib fracture Multiple lumbar vertebral body transverse process fractures Right scalp hematoma ABL anemia  ID - fever and rhonchi - cefepime started 11/22, will plan for 7 day course FEN - PP cortrak. Increase FWF to q4h for continued hypernatremia (149 today). Replete potassium with k-phos as patient is hyperchloremic. Advance tube feeds slowly to goal of 65. If unable to tolerate advancement, plan for CT abd/pelvis. Agitation - Increase Seroquel to 100mg  BID, increase Klonipin to 1mg  BID. Wean precedex as able. VTE - SCDs, LMWH Dispo - ICU  Sophronia Simas, MD [facility] Surgery General, Hepatobiliary and Pancreatic Surgery 10/25/23 9:05 AM

## 2023-10-25 NOTE — Progress Notes (Addendum)
Orthopedic Tech Progress Note Patient Details:  Zi Waterman 1961/07/28 [id number]  [facility], RN, called re: an issue with pt's traction. The traction bow on the RLE had popped off of the medial side of the pin as the pt was being turned for a bath. The pin appeared to have shifted laterally about 1". Dr. Everardo Pacific was contacted as he was ortho on call. At this time the bow and weights are back in place.  Patient ID: Khamar Standard, male   DOB: 05-25-1961, 62 y.o.   MRN: [id number]  Docia Furl 10/25/2023, 4:24 PM

## 2023-10-25 NOTE — Progress Notes (Signed)
After turning for pt's bath, traction came disconnected from one side of pt's leg. This RN paged ortho tech. Per ortho tech, MD will need to fix traction. Ortho techs to page ortho MD. Pt currently disconnected from weights. Will await further instructions.   Lady Deutscher, RN

## 2023-10-25 NOTE — Progress Notes (Addendum)
Speech Language Pathology Treatment: Dysphagia  Patient Details Name: Dois Billing MRN: [id number] DOB: 08/26/1961 Today's Date: 10/25/2023 Time: 6213-0865 SLP Time Calculation (min) (ACUTE ONLY): 12 min  Assessment / Plan / Recommendation Clinical Impression  RN requesting SLP visit. Pt has been begging for ice, but night and day RN have been unsure, given persistent wet vocal quality, that ice should be given.   SLP observed pt to be alert, begging for ice and food, to be fed that staff is starving him. He has no awareness of injury or rationale for NPO. Pt does have wet vocal quality at baseline, cough too weak to expectorate/clear secretions. Ice chip given with cues for immediate mastication and effortful swallow. Pt vocal quality audibly wetter, and with cues to cough/clear/hock pt successfully expectorated thick mucous. Repeated x3.   The purpose of ice for this pt is not only his comfort, but to keep pharynx healthy and moist. Pooled secretions in an NPO pt can thicken and create harmful persistent secretions that pt can't mobilize. These can even form mucous plugs. A few ice chips, given after oral care, can loosen secretions for pt to clear and maintain airway hygiene. Also can preserve some swallow function. Minimal ice is not expected to be harmful to patient even if some moisture is aspirated. Cued coughs after trials also will help airway protection. Encourage RN to continue these. Posted a sign for guidance and as rational to be given to pt when he is asking for ice.    HPI HPI: Pt is a 62 yo male presenting 11/11 as PHBC with pelvic hemorrhage s/p multiple ex laps (11/12, 11/13, 11/18 with closure) and aortic transsection (s/p TEVAR 11/12). Pt also found to have R acetabular fx, B superior and inferior rami fxs, R iliac fx, L sacral fx (R femur in traction, s/p SI screw and L acetab perc fixation 11/20), R rib fx, multiple lumbar vertebral body transverse process fxs, and multiple  hematomas (R gluteal, R psoas, R scalp). ETT 11/11-11/24. PMH includes: PNA, alcohol abuse, anxiety, depression, GERD, CVA, sarcoidosis of lung, chronic cough      SLP Plan  Continue with current plan of care      Recommendations for follow up therapy are one component of a multi-disciplinary discharge planning process, led by the attending physician.  Recommendations may be updated based on patient status, additional functional criteria and insurance authorization.    Recommendations  Diet recommendations: NPO Medication Administration: Via alternative means                  Oral care prior to ice chip/H20 (Oral care Q4)   Frequent or constant Supervision/Assistance       Continue with current plan of care     Nicolas Sisler, Riley Nearing  10/25/2023, 10:58 AM

## 2023-10-25 NOTE — Plan of Care (Signed)
  Problem: Education: Goal: Knowledge of General Education information will improve Description: Including pain rating scale, medication(s)/side effects and non-pharmacologic comfort measures Outcome: Progressing   Problem: Clinical Measurements: Goal: Will remain free from infection Outcome: Progressing Goal: Respiratory complications will improve Outcome: Progressing   Problem: Nutrition: Goal: Adequate nutrition will be maintained Outcome: Progressing   Problem: Education: Goal: Verbalization of understanding the information provided (i.e., activity precautions, restrictions, etc) will improve Outcome: Progressing Goal: Individualized Educational Video(s) Outcome: Progressing   Problem: Activity: Goal: Ability to ambulate and perform ADLs will improve Outcome: Progressing   Problem: Clinical Measurements: Goal: Postoperative complications will be avoided or minimized Outcome: Progressing   Problem: Self-Concept: Goal: Ability to maintain and perform role responsibilities to the fullest extent possible will improve Outcome: Progressing   Problem: Pain Management: Goal: Pain level will decrease Outcome: Progressing   Problem: Skin Integrity: Goal: Risk for impaired skin integrity will decrease Outcome: Not Progressing   Problem: Safety: Goal: Non-violent Restraint(s) Outcome: Completed/Met

## 2023-10-26 ENCOUNTER — Inpatient Hospital Stay (HOSPITAL_COMMUNITY): Payer: MEDICAID

## 2023-10-26 LAB — BASIC METABOLIC PANEL
Anion gap: 7 (ref 5–15)
BUN: 25 mg/dL — ABNORMAL HIGH (ref 8–23)
CO2: 23 mmol/L (ref 22–32)
Calcium: 8 mg/dL — ABNORMAL LOW (ref 8.9–10.3)
Chloride: 118 mmol/L — ABNORMAL HIGH (ref 98–111)
Creatinine, Ser: 1.02 mg/dL (ref 0.61–1.24)
GFR, Estimated: >60 mL/min (ref >60)
Glucose, Bld: 141 mg/dL — ABNORMAL HIGH (ref 70–99)
Potassium: 3.6 mmol/L (ref 3.5–5.1)
Sodium: 148 mmol/L — ABNORMAL HIGH (ref 135–145)

## 2023-10-26 LAB — GLUCOSE, CAPILLARY
Glucose-Capillary: 100 mg/dL — ABNORMAL HIGH (ref 70–99)
Glucose-Capillary: 116 mg/dL — ABNORMAL HIGH (ref 70–99)
Glucose-Capillary: 125 mg/dL — ABNORMAL HIGH (ref 70–99)
Glucose-Capillary: 133 mg/dL — ABNORMAL HIGH (ref 70–99)
Glucose-Capillary: 136 mg/dL — ABNORMAL HIGH (ref 70–99)
Glucose-Capillary: 141 mg/dL — ABNORMAL HIGH (ref 70–99)
Glucose-Capillary: 150 mg/dL — ABNORMAL HIGH (ref 70–99)

## 2023-10-26 LAB — CBC
HCT: 31.8 % — ABNORMAL LOW (ref 39.0–52.0)
Hemoglobin: 9.8 g/dL — ABNORMAL LOW (ref 13.0–17.0)
MCH: 27.5 pg (ref 26.0–34.0)
MCHC: 30.8 g/dL (ref 30.0–36.0)
MCV: 89.3 fL (ref 80.0–100.0)
Platelets: 636 10*3/uL — ABNORMAL HIGH (ref 150–400)
RBC: 3.56 MIL/uL — ABNORMAL LOW (ref 4.22–5.81)
RDW: 18.2 % — ABNORMAL HIGH (ref 11.5–15.5)
WBC: 13.7 10*3/uL — ABNORMAL HIGH (ref 4.0–10.5)
nRBC: 0.5 % — ABNORMAL HIGH (ref 0.0–0.2)

## 2023-10-26 MED ORDER — FOLIC ACID 1 MG PO TABS
1.0000 mg | ORAL_TABLET | Freq: Every day | ORAL | Status: DC
  Administered 2023-10-26 – 2023-11-05 (×11): 1 mg
  Filled 2023-10-26 (×10): qty 1

## 2023-10-26 MED ORDER — LORAZEPAM 2 MG/ML IJ SOLN: 1.0000 mg | INTRAMUSCULAR | Status: DC | PRN

## 2023-10-26 MED ORDER — LORAZEPAM 2 MG/ML IJ SOLN: 1.0000 mg | INTRAMUSCULAR | Status: AC | PRN

## 2023-10-26 MED ORDER — FOLIC ACID 1 MG PO TABS: 1.0000 mg | ORAL_TABLET | Freq: Every day | ORAL | Status: DC

## 2023-10-26 MED ORDER — MELATONIN 3 MG PO TABS
3.0000 mg | ORAL_TABLET | Freq: Every day | ORAL | Status: DC
  Administered 2023-10-26 – 2023-11-03 (×9): 3 mg via ORAL
  Filled 2023-10-26 (×9): qty 1

## 2023-10-26 MED ORDER — PIVOT 1.5 CAL PO LIQD: 1000.0000 mL | ORAL | Status: DC

## 2023-10-26 MED ORDER — LORAZEPAM 1 MG PO TABS: 1.0000 mg | ORAL_TABLET | ORAL | Status: DC | PRN

## 2023-10-26 MED ORDER — FUROSEMIDE 10 MG/ML IJ SOLN
40.0000 mg | Freq: Two times a day (BID) | INTRAMUSCULAR | Status: AC
  Administered 2023-10-26 – 2023-10-27 (×2): 40 mg via INTRAVENOUS
  Filled 2023-10-26 (×2): qty 4

## 2023-10-26 MED ORDER — THIAMINE MONONITRATE 100 MG PO TABS
100.0000 mg | ORAL_TABLET | Freq: Every day | ORAL | Status: DC
  Administered 2023-10-26 – 2023-11-03 (×8): 100 mg via ORAL
  Filled 2023-10-26 (×8): qty 1

## 2023-10-26 MED ORDER — ADULT MULTIVITAMIN W/MINERALS CH: 1.0000 | ORAL_TABLET | Freq: Every day | ORAL | Status: DC

## 2023-10-26 MED ORDER — LORAZEPAM 1 MG PO TABS
1.0000 mg | ORAL_TABLET | ORAL | Status: AC | PRN
  Administered 2023-10-26: 2 mg
  Filled 2023-10-26: qty 2

## 2023-10-26 MED ORDER — THIAMINE HCL 100 MG/ML IJ SOLN
100.0000 mg | Freq: Every day | INTRAMUSCULAR | Status: DC
Start: 2023-10-26 — End: 2023-11-03
  Administered 2023-10-29: 100 mg via INTRAVENOUS
  Filled 2023-10-26 (×5): qty 2

## 2023-10-26 NOTE — Progress Notes (Signed)
Speech Language Pathology Treatment: Dysphagia  Patient Details Name: [name] [name] MRN: [id number] DOB: 01-03-1961 Today's Date: 10/26/2023 Time: 3664-4034 SLP Time Calculation (min) (ACUTE ONLY): 14 min  Assessment / Plan / Recommendation Clinical Impression  SLP reinforced education with pt regarding current recommendations for NPO status except for limited amounts of ice. Pt is in agreement with plan, but note that per nursing, he continues to ask for food and drink. Oral care was provided before offering a few pieces of ice. Mod cues were given to expectorate secretion with each trial, with pt initiating use of yankauer more today. He held and used it with assistance from SLP. Would keep current plan in place for now with SLP f/u to assess for readiness for instrumental testing.    HPI HPI: Pt is a 62 yo male presenting 11/11 as PHBC with pelvic hemorrhage s/p multiple ex laps (11/12, 11/13, 11/18 with closure) and aortic transsection (s/p TEVAR 11/12). Pt also found to have R acetabular fx, B superior and inferior rami fxs, R iliac fx, L sacral fx (R femur in traction, s/p SI screw and L acetab perc fixation 11/20), R rib fx, multiple lumbar vertebral body transverse process fxs, and multiple hematomas (R gluteal, R psoas, R scalp). ETT 11/11-11/24. PMH includes: PNA, alcohol abuse, anxiety, depression, GERD, CVA, sarcoidosis of lung, chronic cough      SLP Plan  Continue with current plan of care      Recommendations for follow up therapy are one component of a multi-disciplinary discharge planning process, led by the attending physician.  Recommendations may be updated based on patient status, additional functional criteria and insurance authorization.    Recommendations  Diet recommendations: NPO;Other(comment) (2-3 pieces of ice after oral care with nursing to help with secretions) Medication Administration: Via alternative means                  Oral care prior to ice  chip/H20 (oral care Q4)   Frequent or constant Supervision/Assistance Dysphagia, unspecified (R13.10)     Continue with current plan of care     Mahala Menghini., M.A. CCC-SLP Acute Rehabilitation Services Office [phone]  Secure chat preferred   10/26/2023, 2:54 PM

## 2023-10-26 NOTE — Progress Notes (Signed)
2000 - Pt has episode of emesis. Tube feeds were stopped. TRN notified. Per MD, tube feeds were restarted at goal rate.   2200 - Pt became hypotensive. TRN notified. Precedex turned off, MD aware. 1 liter bolus ordered and administered by this RN. Will continue to monitor and assess the pt and notify of any additional changes.   2300 - Pt's BP within parameters following 1 liter bolus.   0100 - Pt has episode of emesis. Tube feeds were stopped. TRN notified. Per MD, tube feeds to be restarted at 20. Pt is tachycardic and tachypneic. MD aware. No new orders at this time. Pt assessment is unchanged, and this RN will continue to monitor and assess the pt and notify of any changes.

## 2023-10-26 NOTE — Progress Notes (Signed)
Trauma Service Note  Chief Complaint/Subjective: Vomited overnight, NG with biliious output, PPFT still going at 20 ml/h, +Bm yesterday  Objective: Vital signs in last 24 hours: Temp:  [98.3 F (36.8 C)-101.8 F (38.8 C)] 99.4 F (37.4 C) (11/29 0800) Pulse Rate:  [65-132] 118 (11/29 0730) Resp:  [21-42] 28 (11/29 0730) BP: (81-153)/(33-83) 136/71 (11/29 0730) SpO2:  [89 %-100 %] 100 % (11/29 0730) Weight:  [49.4 kg] 49.4 kg (11/29 0500) Last BM Date : 10/25/23  Intake/Output from previous day: 11/28 0701 - 11/29 0700 In: 3704.7 [I.V.:364.8; NG/GT:1629.3; IV Piggyback:1710.5] Out: 2850 [Urine:1550; Emesis/NG output:1300]  General: NAd Lungs: nonlabored Abd: soft, distended Extremities: no edema Neuro: AOx4  Lab Results:  Recent Labs    10/24/23 0539 10/25/23 0520  WBC 13.2* 11.6*  HGB 10.6* 10.6*  HCT 34.2* 34.7*  PLT 711* 626*   Recent Labs    10/24/23 0539 10/25/23 0520  NA 151* 149*  K 3.5 3.3*  CL 116* 120*  CO2 23 23  GLUCOSE 209* 140*  BUN 30* 28*  CREATININE 1.19 1.07  CALCIUM 8.5* 8.1*   No results for input(s): "LABPROT", "INR" in the last 72 hours. No results for input(s): "PHART", "HCO3" in the last 72 hours.  Invalid input(s): "PCO2", "PO2"  Anti-infectives: Anti-infectives (From admission, onward)    Start     Dose/Rate Route Frequency Ordered Stop   10/24/23 1800  ceFEPIme (MAXIPIME) 2 g in sodium chloride 0.9 % 100 mL IVPB        2 g 200 mL/hr over 30 Minutes Intravenous Every 12 hours 10/24/23 1108 10/27/23 0559   10/21/23 1400  ceFEPIme (MAXIPIME) 2 g in sodium chloride 0.9 % 100 mL IVPB  Status:  Discontinued        2 g 200 mL/hr over 30 Minutes Intravenous Every 8 hours 10/21/23 0844 10/24/23 1108   10/20/23 1900  ceFEPIme (MAXIPIME) 2 g in sodium chloride 0.9 % 100 mL IVPB  Status:  Discontinued        2 g 200 mL/hr over 30 Minutes Intravenous Every 12 hours 10/20/23 0912 10/21/23 0844   10/19/23 2300  ceFEPIme (MAXIPIME) 2  g in sodium chloride 0.9 % 100 mL IVPB  Status:  Discontinued        2 g 200 mL/hr over 30 Minutes Intravenous Every 8 hours 10/19/23 2157 10/20/23 0912   10/17/23 2200  ceFAZolin (ANCEF) IVPB 2g/100 mL premix        2 g 200 mL/hr over 30 Minutes Intravenous Every 8 hours 10/17/23 1541 10/18/23 1417   10/12/23 1745  ceFEPIme (MAXIPIME) 2 g in sodium chloride 0.9 % 100 mL IVPB  Status:  Discontinued        2 g 200 mL/hr over 30 Minutes Intravenous Every 8 hours 10/12/23 1319 10/14/23 1037   10/11/23 0945  ceFEPIme (MAXIPIME) 2 g in sodium chloride 0.9 % 100 mL IVPB  Status:  Discontinued        2 g 200 mL/hr over 30 Minutes Intravenous Every 12 hours 10/11/23 0846 10/12/23 1319       Assessment/Plan: s/p Procedure(s): OPEN REDUCTION INTERNAL FIXATION (ORIF) PELVIC FRACTURE OPEN REDUCTION INTERNAL FIXATION  RIGHT ACETABULUM FRACTURE  PHBC   S/P ex lap, pelvic packing (5 laps), and temporary abdominal closure with vicryl mesh by Dr. Dossie Der 11/12 - return to OR 11/13 for ex lap, removal of packs and Abthera placement by Dr. Janee Morn - extensive mesenteric contusions but bowel viable, S/P ex lap and closure  11/18 Dr. Bedelia Person Aortic transsection - Dr. Karin Lieu did TEVAR 11/12 R acetabular FX, B superior and inferior rami FXs, R iliac FX, L sacral FX - Dr. Hulda Humphrey consulted, R femur in skeletal traction, S/P SI screw and L acetab perc fixation 11/20 by Dr. Jena Gauss. RLE traction as definitive management Pelvic hemorrhage - packed as above, S/P IR embolization by Dr. Elby Showers 11/12 Urology consulted (Dr. Lafonda Mosses) - no bladder injury found, no ureteral injury Acute hypoxic respiratory failure - TRALI, extubated 11/24, guaifenesin for secretions. Add PT and flutter valve Right gluteal hematoma Right psoas hematoma Right 11th rib fracture Multiple lumbar vertebral body transverse process fractures Right scalp hematoma ABL anemia  ID - fever and rhonchi - cefepime started 11/22, will plan for 7 day  course FEN - PP cortrak. Increase FWF to q4h for continued hypernatremia (149 today). Replete potassium with k-phos as patient is hyperchloremic. Advance tube feeds slowly to goal of 65. If unable to tolerate advancement, plan for CT abd/pelvis. Agitation - Increase Seroquel to 100mg  BID, increase Klonipin to 1mg  BID. Wean precedex as able. VTE - SCDs, LMWH Dispo - ICU   LOS: 18 days   I reviewed last 24 h vitals and pain scores, last 48 h intake and output, last 24 h labs and trends, and last 24 h imaging results.  This care required high  level of medical decision making.   De Blanch Numair Masden Trauma Surgeon [phone] Surgery at [facility] 10/26/2023

## 2023-10-27 ENCOUNTER — Inpatient Hospital Stay (HOSPITAL_COMMUNITY): Payer: MEDICAID

## 2023-10-27 LAB — GLUCOSE, CAPILLARY
Glucose-Capillary: 120 mg/dL — ABNORMAL HIGH (ref 70–99)
Glucose-Capillary: 123 mg/dL — ABNORMAL HIGH (ref 70–99)
Glucose-Capillary: 135 mg/dL — ABNORMAL HIGH (ref 70–99)
Glucose-Capillary: 152 mg/dL — ABNORMAL HIGH (ref 70–99)
Glucose-Capillary: 159 mg/dL — ABNORMAL HIGH (ref 70–99)
Glucose-Capillary: 168 mg/dL — ABNORMAL HIGH (ref 70–99)

## 2023-10-27 LAB — BASIC METABOLIC PANEL
Anion gap: 7 (ref 5–15)
BUN: 30 mg/dL — ABNORMAL HIGH (ref 8–23)
CO2: 24 mmol/L (ref 22–32)
Calcium: 8.1 mg/dL — ABNORMAL LOW (ref 8.9–10.3)
Chloride: 117 mmol/L — ABNORMAL HIGH (ref 98–111)
Creatinine, Ser: 1.06 mg/dL (ref 0.61–1.24)
GFR, Estimated: >60 mL/min (ref >60)
Glucose, Bld: 153 mg/dL — ABNORMAL HIGH (ref 70–99)
Potassium: 3.7 mmol/L (ref 3.5–5.1)
Sodium: 148 mmol/L — ABNORMAL HIGH (ref 135–145)

## 2023-10-27 LAB — CBC
HCT: 34.2 % — ABNORMAL LOW (ref 39.0–52.0)
Hemoglobin: 10 g/dL — ABNORMAL LOW (ref 13.0–17.0)
MCH: 26.3 pg (ref 26.0–34.0)
MCHC: 29.2 g/dL — ABNORMAL LOW (ref 30.0–36.0)
MCV: 90 fL (ref 80.0–100.0)
Platelets: 582 10*3/uL — ABNORMAL HIGH (ref 150–400)
RBC: 3.8 MIL/uL — ABNORMAL LOW (ref 4.22–5.81)
RDW: 18.3 % — ABNORMAL HIGH (ref 11.5–15.5)
WBC: 11 10*3/uL — ABNORMAL HIGH (ref 4.0–10.5)
nRBC: 1 % — ABNORMAL HIGH (ref 0.0–0.2)

## 2023-10-27 MED ORDER — FREE WATER
200.0000 mL | Status: DC
  Administered 2023-10-27 – 2023-10-29 (×25): 200 mL

## 2023-10-27 MED ORDER — PIVOT 1.5 CAL PO LIQD
1000.0000 mL | ORAL | Status: DC
  Administered 2023-10-27 – 2023-10-28 (×2): 1000 mL

## 2023-10-27 MED ORDER — PIVOT 1.5 CAL PO LIQD
1000.0000 mL | ORAL | Status: DC
Start: 2023-10-27 — End: 2023-10-27
  Administered 2023-10-27: 1000 mL
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
## 2023-10-27 NOTE — Progress Notes (Signed)
Per MD Freida Busman, advance tube feed by 63ml/hr every 6hours until goal is met  Meryl Dare, RN

## 2023-10-27 NOTE — Progress Notes (Signed)
Patient pulled out NG, replaced NG.

## 2023-10-27 NOTE — Progress Notes (Signed)
Patient pulled out NG, NG replaced.

## 2023-10-28 LAB — CBC
HCT: 35.2 % — ABNORMAL LOW (ref 39.0–52.0)
Hemoglobin: 10.9 g/dL — ABNORMAL LOW (ref 13.0–17.0)
MCH: 26.9 pg (ref 26.0–34.0)
MCHC: 31 g/dL (ref 30.0–36.0)
MCV: 86.9 fL (ref 80.0–100.0)
Platelets: 570 10*3/uL — ABNORMAL HIGH (ref 150–400)
RBC: 4.05 MIL/uL — ABNORMAL LOW (ref 4.22–5.81)
RDW: 17.7 % — ABNORMAL HIGH (ref 11.5–15.5)
WBC: 12.6 10*3/uL — ABNORMAL HIGH (ref 4.0–10.5)
nRBC: 0.6 % — ABNORMAL HIGH (ref 0.0–0.2)

## 2023-10-28 LAB — BASIC METABOLIC PANEL
Anion gap: 10 (ref 5–15)
BUN: 33 mg/dL — ABNORMAL HIGH (ref 8–23)
CO2: 25 mmol/L (ref 22–32)
Calcium: 8.3 mg/dL — ABNORMAL LOW (ref 8.9–10.3)
Chloride: 108 mmol/L (ref 98–111)
Creatinine, Ser: 0.92 mg/dL (ref 0.61–1.24)
GFR, Estimated: >60 mL/min (ref >60)
Glucose, Bld: 180 mg/dL — ABNORMAL HIGH (ref 70–99)
Potassium: 3.4 mmol/L — ABNORMAL LOW (ref 3.5–5.1)
Sodium: 143 mmol/L (ref 135–145)

## 2023-10-28 LAB — GLUCOSE, CAPILLARY
Glucose-Capillary: 102 mg/dL — ABNORMAL HIGH (ref 70–99)
Glucose-Capillary: 123 mg/dL — ABNORMAL HIGH (ref 70–99)
Glucose-Capillary: 128 mg/dL — ABNORMAL HIGH (ref 70–99)
Glucose-Capillary: 130 mg/dL — ABNORMAL HIGH (ref 70–99)
Glucose-Capillary: 134 mg/dL — ABNORMAL HIGH (ref 70–99)
Glucose-Capillary: 154 mg/dL — ABNORMAL HIGH (ref 70–99)
Glucose-Capillary: 154 mg/dL — ABNORMAL HIGH (ref 70–99)

## 2023-10-28 LAB — MAGNESIUM: Magnesium: 2.6 mg/dL — ABNORMAL HIGH (ref 1.7–2.4)

## 2023-10-28 MED ORDER — POTASSIUM CHLORIDE 20 MEQ PO PACK
40.0000 meq | PACK | Freq: Once | ORAL | Status: AC
  Administered 2023-10-28: 40 meq
  Filled 2023-10-28: qty 2

## 2023-10-28 MED ORDER — ORAL CARE MOUTH RINSE
15.0000 mL | OROMUCOSAL | Status: DC
  Administered 2023-10-28 – 2023-11-12 (×55): 15 mL via OROMUCOSAL

## 2023-10-28 MED ORDER — POTASSIUM CHLORIDE 20 MEQ PO PACK: 40.0000 meq | PACK | Freq: Two times a day (BID) | ORAL | Status: DC

## 2023-10-28 MED ORDER — ORAL CARE MOUTH RINSE: 15.0000 mL | OROMUCOSAL | Status: DC | PRN

## 2023-10-28 NOTE — Progress Notes (Signed)
**Note [name]-Identified via Obfuscation** Follow up - Trauma and Critical Care  Patient Details:    [name] [name] is an 62 y.o. male.  Anti-infectives:  Anti-infectives (From admission, onward)    Start     Dose/Rate Route Frequency Ordered Stop   10/24/23 1800  ceFEPIme (MAXIPIME) 2 g in sodium chloride 0.9 % 100 mL IVPB        2 g 200 mL/hr over 30 Minutes Intravenous Every 12 hours 10/24/23 1108 10/26/23 1813   10/21/23 1400  ceFEPIme (MAXIPIME) 2 g in sodium chloride 0.9 % 100 mL IVPB  Status:  Discontinued        2 g 200 mL/hr over 30 Minutes Intravenous Every 8 hours 10/21/23 0844 10/24/23 1108   10/20/23 1900  ceFEPIme (MAXIPIME) 2 g in sodium chloride 0.9 % 100 mL IVPB  Status:  Discontinued        2 g 200 mL/hr over 30 Minutes Intravenous Every 12 hours 10/20/23 0912 10/21/23 0844   10/19/23 2300  ceFEPIme (MAXIPIME) 2 g in sodium chloride 0.9 % 100 mL IVPB  Status:  Discontinued        2 g 200 mL/hr over 30 Minutes Intravenous Every 8 hours 10/19/23 2157 10/20/23 0912   10/17/23 2200  ceFAZolin (ANCEF) IVPB 2g/100 mL premix        2 g 200 mL/hr over 30 Minutes Intravenous Every 8 hours 10/17/23 1541 10/18/23 1417   10/12/23 1745  ceFEPIme (MAXIPIME) 2 g in sodium chloride 0.9 % 100 mL IVPB  Status:  Discontinued        2 g 200 mL/hr over 30 Minutes Intravenous Every 8 hours 10/12/23 1319 10/14/23 1037   10/11/23 0945  ceFEPIme (MAXIPIME) 2 g in sodium chloride 0.9 % 100 mL IVPB  Status:  Discontinued        2 g 200 mL/hr over 30 Minutes Intravenous Every 12 hours 10/11/23 0846 10/12/23 1319       Consults: Treatment Team:  Roby Lofts, MD Victorino Sparrow, MD   Chief Complaint/Subjective:    Overnight Issues: Had NG come out and now replaced.  Objective:  Vital signs for last 24 hours: Temp:  [99.5 F (37.5 C)-100.5 F (38.1 C)] 99.9 F (37.7 C) (12/01 0400) Pulse Rate:  [97-130] 100 (12/01 0800) Resp:  [17-35] 19 (12/01 0800) BP: (103-150)/(51-112) 140/92 (12/01 0800) SpO2:  [97 %-100 %]  100 % (12/01 0800)  Hemodynamic parameters for last 24 hours:    Intake/Output from previous day: 11/30 0701 - 12/01 0700 In: 1936.7 [I.V.:10; NG/GT:1926.7] Out: 2150 [Urine:1150; Emesis/NG output:1000]   Vent settings for last 24 hours:    Physical Exam:  Gen: somnolent but arousable HEENT: NG and DHT in position Resp: nonlabored Cardiovascular: RRR Abdomen: soft, moderate distension Ext: no edema Neuro: somnolent but arousable   Assessment/Plan:   PHBC   S/P ex lap, pelvic packing (5 laps), and temporary abdominal closure with vicryl mesh by Dr. Dossie Der 11/12 - return to OR 11/13 for ex lap, removal of packs and Abthera placement by Dr. Janee Morn - extensive mesenteric contusions but bowel viable, S/P ex lap and closure 11/18 Dr. Bedelia Person Aortic transsection - Dr. Karin Lieu did TEVAR 11/12 R acetabular FX, B superior and inferior rami FXs, R iliac FX, L sacral FX - Dr. Hulda Humphrey consulted, R femur in skeletal traction, S/P SI screw and L acetab perc fixation 11/20 by Dr. Jena Gauss. RLE traction as definitive management for a total of 4 weeks. Pelvic hemorrhage - packed as above, S/P  IR embolization by Dr. Elby Showers 11/12 Acute hypoxic respiratory failure - TRALI, extubated 11/24, guaifenesin for secretions. Add PT and flutter valve. Still having a lot of secretions with weak cough, NT suction as needed. Right gluteal hematoma Right psoas hematoma Right 11th rib fracture Multiple lumbar vertebral body transverse process fractures Right scalp hematoma ABL anemia  ID - fever and rhonchi - cefepime started 11/22, completed 7 day course FEN - PP cortrak. Increase FWF to q2h for hypernatremia, which is slowly improving. Vomiting does not seem related to feeds as no feeds were noted in either emesis or in NG output. Continue advancing feeds to goal. Agitation - Seroquel, prn ativan. Off precedex. VTE - SCDs, LMWH Dispo - Keep in ICU today given need for frequent NT suction.    LOS: 20 days    [name] [name] 10/28/2023  *Care during the described time interval was provided by me and/or other providers on the critical care team.  I have reviewed this patient's available data, including medical history, events of note, physical examination and test results as part of my evaluation.

## 2023-10-29 ENCOUNTER — Inpatient Hospital Stay (HOSPITAL_COMMUNITY): Payer: MEDICAID

## 2023-10-29 ENCOUNTER — Encounter: Payer: Self-pay | Admitting: Pulmonary Disease

## 2023-10-29 LAB — CBC
HCT: 35.4 % — ABNORMAL LOW (ref 39.0–52.0)
Hemoglobin: 10.7 g/dL — ABNORMAL LOW (ref 13.0–17.0)
MCH: 26.5 pg (ref 26.0–34.0)
MCHC: 30.2 g/dL (ref 30.0–36.0)
MCV: 87.6 fL (ref 80.0–100.0)
Platelets: 548 10*3/uL — ABNORMAL HIGH (ref 150–400)
RBC: 4.04 MIL/uL — ABNORMAL LOW (ref 4.22–5.81)
RDW: 17.4 % — ABNORMAL HIGH (ref 11.5–15.5)
WBC: 12.4 10*3/uL — ABNORMAL HIGH (ref 4.0–10.5)
nRBC: 0.2 % (ref 0.0–0.2)

## 2023-10-29 LAB — GLUCOSE, CAPILLARY
Glucose-Capillary: 86 mg/dL (ref 70–99)
Glucose-Capillary: 132 mg/dL — ABNORMAL HIGH (ref 70–99)
Glucose-Capillary: 145 mg/dL — ABNORMAL HIGH (ref 70–99)
Glucose-Capillary: 128 mg/dL — ABNORMAL HIGH (ref 70–99)
Glucose-Capillary: 120 mg/dL — ABNORMAL HIGH (ref 70–99)

## 2023-10-29 LAB — BASIC METABOLIC PANEL
Anion gap: 8 (ref 5–15)
BUN: 36 mg/dL — ABNORMAL HIGH (ref 8–23)
CO2: 24 mmol/L (ref 22–32)
Calcium: 8.4 mg/dL — ABNORMAL LOW (ref 8.9–10.3)
Chloride: 109 mmol/L (ref 98–111)
Creatinine, Ser: 0.88 mg/dL (ref 0.61–1.24)
GFR, Estimated: >60 mL/min (ref >60)
Glucose, Bld: 159 mg/dL — ABNORMAL HIGH (ref 70–99)
Potassium: 4.2 mmol/L (ref 3.5–5.1)
Sodium: 141 mmol/L (ref 135–145)

## 2023-10-29 MED ORDER — PIVOT 1.5 CAL PO LIQD
1000.0000 mL | ORAL | Status: DC
  Administered 2023-10-29 – 2023-11-01 (×6): 1000 mL

## 2023-10-29 MED ORDER — FREE WATER
200.0000 mL | Status: DC
  Administered 2023-10-29 – 2023-11-03 (×27): 200 mL

## 2023-10-29 MED ORDER — CLONAZEPAM 0.5 MG PO TABS
0.5000 mg | ORAL_TABLET | Freq: Two times a day (BID) | ORAL | Status: DC
  Administered 2023-10-29 – 2023-11-05 (×14): 0.5 mg
  Filled 2023-10-29 (×13): qty 1

## 2023-10-29 MED ORDER — QUETIAPINE FUMARATE 100 MG PO TABS
100.0000 mg | ORAL_TABLET | Freq: Every day | ORAL | Status: DC
  Administered 2023-10-30 – 2023-11-04 (×6): 100 mg
  Filled 2023-10-29 (×6): qty 1

## 2023-10-29 MED ORDER — VITAMIN D 25 MCG (1000 UNIT) PO TABS
1000.0000 [IU] | ORAL_TABLET | Freq: Every day | ORAL | Status: DC
  Administered 2023-10-29 – 2023-11-05 (×8): 1000 [IU]
  Filled 2023-10-29 (×7): qty 1

## 2023-10-29 MED ORDER — QUETIAPINE FUMARATE 50 MG PO TABS
50.0000 mg | ORAL_TABLET | Freq: Every morning | ORAL | Status: DC
  Administered 2023-10-30 – 2023-11-05 (×7): 50 mg
  Filled 2023-10-29 (×6): qty 1

## 2023-10-29 NOTE — Progress Notes (Signed)
Per order NGT clamped at this time.
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
## 2023-10-30 LAB — BASIC METABOLIC PANEL
Anion gap: 8 (ref 5–15)
BUN: 36 mg/dL — ABNORMAL HIGH (ref 8–23)
CO2: 24 mmol/L (ref 22–32)
Calcium: 8.4 mg/dL — ABNORMAL LOW (ref 8.9–10.3)
Chloride: 106 mmol/L (ref 98–111)
Creatinine, Ser: 0.76 mg/dL (ref 0.61–1.24)
GFR, Estimated: >60 mL/min (ref >60)
Glucose, Bld: 173 mg/dL — ABNORMAL HIGH (ref 70–99)
Potassium: 4 mmol/L (ref 3.5–5.1)
Sodium: 138 mmol/L (ref 135–145)

## 2023-10-30 LAB — CBC
HCT: 33.6 % — ABNORMAL LOW (ref 39.0–52.0)
Hemoglobin: 10.5 g/dL — ABNORMAL LOW (ref 13.0–17.0)
MCH: 26.9 pg (ref 26.0–34.0)
MCHC: 31.3 g/dL (ref 30.0–36.0)
MCV: 86.2 fL (ref 80.0–100.0)
Platelets: 526 10*3/uL — ABNORMAL HIGH (ref 150–400)
RBC: 3.9 MIL/uL — ABNORMAL LOW (ref 4.22–5.81)
RDW: 17.1 % — ABNORMAL HIGH (ref 11.5–15.5)
WBC: 13 10*3/uL — ABNORMAL HIGH (ref 4.0–10.5)
nRBC: 0 % (ref 0.0–0.2)

## 2023-10-30 LAB — GLUCOSE, CAPILLARY
Glucose-Capillary: 118 mg/dL — ABNORMAL HIGH (ref 70–99)
Glucose-Capillary: 122 mg/dL — ABNORMAL HIGH (ref 70–99)
Glucose-Capillary: 127 mg/dL — ABNORMAL HIGH (ref 70–99)
Glucose-Capillary: 149 mg/dL — ABNORMAL HIGH (ref 70–99)
Glucose-Capillary: 155 mg/dL — ABNORMAL HIGH (ref 70–99)
Glucose-Capillary: 157 mg/dL — ABNORMAL HIGH (ref 70–99)
Glucose-Capillary: 178 mg/dL — ABNORMAL HIGH (ref 70–99)

## 2023-10-30 MED ORDER — IPRATROPIUM-ALBUTEROL 0.5-2.5 (3) MG/3ML IN SOLN: 3.0000 mL | Freq: Four times a day (QID) | RESPIRATORY_TRACT | Status: DC | PRN

## 2023-10-30 MED ORDER — GUAIFENESIN 100 MG/5ML PO LIQD
10.0000 mL | Freq: Four times a day (QID) | ORAL | Status: DC
  Administered 2023-10-30 – 2023-11-05 (×23): 10 mL
  Filled 2023-10-30 (×23): qty 10

## 2023-10-30 MED ORDER — HALOPERIDOL LACTATE 5 MG/ML IJ SOLN
5.0000 mg | Freq: Two times a day (BID) | INTRAMUSCULAR | Status: AC | PRN
  Filled 2023-10-30: qty 1

## 2023-10-30 MED ORDER — HALOPERIDOL 5 MG PO TABS
5.0000 mg | ORAL_TABLET | Freq: Two times a day (BID) | ORAL | Status: AC | PRN
  Administered 2023-11-01 – 2023-11-02 (×2): 5 mg
  Filled 2023-10-30 (×2): qty 1

## 2023-10-30 MED ORDER — GUAIFENESIN 100 MG/5ML PO LIQD: 10.0000 mL | Freq: Four times a day (QID) | ORAL | Status: DC | PRN

## 2023-10-30 NOTE — Progress Notes (Signed)
Patient kept scooting down in the bed and kept requesting for food and drinks. RN educated patient on NPO status. Ice chips given after oral care. Patient also requesting for a scissor to cut off his tubes and remove his traction. Patient is persistent in his requests all night, kept hollering out; have not slept at all tonight. Patient does answers all questions appropriately and accurately.

## 2023-10-30 NOTE — Progress Notes (Signed)
This morning weight of 56.9kg is without the weight of the 20lb traction.

## 2023-10-30 NOTE — Progress Notes (Addendum)
Progress Note  13 Days Post-Op  Subjective: Seen with RN.  Patient reported to be restless/agitated last night. His sedation meds were weaned yesterday from   100mg  Seroquel BID -> 50mg  every morning and 100mg  at bedtime  1mg  Klonopin BID -> 0.5mg  BID Patient recently given morning doses of Seroquel and Klonopin and very drowsy but easily awoken and f/c. He denies any pain and reports he just wants to go back to sleep.  NGT clamped - he had some nausea earlier this morning and was given zofran at 816. RN questions if nausea was related to cough. He denies any nausea currently. 6 liquid bm's over the last 24 hours reported by staff. Tolerating PP tf's. Xray yesterday w/ non-obstructive bowel gas pattern. Ongoing cough. No sob currently. Good uop charted (1.44ml/kg/hr).   Afebrile. Remains tachycardic. No hypotension. On RA.   Free water changed from 200 q2 -> q4 yesterday.   Objective: Vital signs in last 24 hours: Temp:  [97.7 F (36.5 C)-98.8 F (37.1 C)] 97.7 F (36.5 C) (12/03 0700) Pulse Rate:  [95-115] 115 (12/03 1000) Resp:  [19-31] 25 (12/03 1000) BP: (104-145)/(58-117) 119/58 (12/03 1000) SpO2:  [97 %-100 %] 98 % (12/03 1000) Weight:  [56.9 kg] 56.9 kg (12/03 0700) Last BM Date : 10/28/23  Intake/Output from previous day: 12/02 0701 - 12/03 0700 In: 2837.3 [NG/GT:2837.3] Out: 2320 [Urine:1850; Emesis/NG output:470] Intake/Output this shift: No intake/output data recorded.  PE: General: Drowsy but awakens easily and f/c. WD, male who is laying in bed in NAD HEENT: Pupils equal and round. EOMI Heart: Tachycardic. Sinus tachy on monitor.  Lungs:  Respiratory effort nonlabored on RA. Rhonci B/l. No obvious rales or wheezing.  Abd: Soft, NT, mild distension, +BS, midline wound stable from picture on 12/2 in media section.  MSK:  RLE in traction. No LE edema.  Neuro: CN 3-12 grossly intact. F/c x 4 ext. SILT to BUE and BLE's Skin: warm and dry  Lab Results:   Recent Labs    10/28/23 0620 10/29/23 0654  WBC 12.6* 12.4*  HGB 10.9* 10.7*  HCT 35.2* 35.4*  PLT 570* 548*   BMET Recent Labs    10/28/23 0620 10/29/23 0654  NA 143 141  K 3.4* 4.2  CL 108 109  CO2 25 24  GLUCOSE 180* 159*  BUN 33* 36*  CREATININE 0.92 0.88  CALCIUM 8.3* 8.4*   PT/INR No results for input(s): "LABPROT", "INR" in the last 72 hours. CMP     Component Value Date/Time   NA 141 10/29/2023 0654   K 4.2 10/29/2023 0654   CL 109 10/29/2023 0654   CO2 24 10/29/2023 0654   GLUCOSE 159 (H) 10/29/2023 0654   BUN 36 (H) 10/29/2023 0654   CREATININE 0.88 10/29/2023 0654   CALCIUM 8.4 (L) 10/29/2023 0654   PROT 7.1 10/25/2023 0520   ALBUMIN 2.4 (L) 10/25/2023 0520   AST 47 (H) 10/25/2023 0520   ALT 45 (H) 10/25/2023 0520   ALKPHOS 127 (H) 10/25/2023 0520   BILITOT 2.5 (H) 10/25/2023 0520   GFRNONAA >60 10/29/2023 0654   Lipase  No results found for: "LIPASE"     Studies/Results: DG Abd 1 View  Result Date: 10/29/2023 CLINICAL DATA:  Emesis EXAM: ABDOMEN - 1 VIEW COMPARISON:  10/29/2023 FINDINGS: Enteric tube tip in the region of duodenal jejunal junction. Esophageal tube tip curled within the stomach, tip position beneath the left diaphragm. Nonobstructed gas pattern. No radiopaque calculi. Fixating screw in the  pelvis across the SI joints. Bilateral sacral fractures. Fixating screw across the left superior pubic ramus. Comminuted inferior and right superior pubic rami fractures and comminuted right acetabular and iliac bone fracture. IMPRESSION: Nonobstructed gas pattern. Feeding tube tip in the region of duodenal jejunal junction. Esophageal tube tip curled within the stomach. Multiple pelvic fractures post fixation. Electronically Signed   By: Jasmine Pang M.D.   On: 10/29/2023 16:37    Anti-infectives: Anti-infectives (From admission, onward)    Start     Dose/Rate Route Frequency Ordered Stop   10/24/23 1800  ceFEPIme (MAXIPIME) 2 g in sodium  chloride 0.9 % 100 mL IVPB        2 g 200 mL/hr over 30 Minutes Intravenous Every 12 hours 10/24/23 1108 10/26/23 1813   10/21/23 1400  ceFEPIme (MAXIPIME) 2 g in sodium chloride 0.9 % 100 mL IVPB  Status:  Discontinued        2 g 200 mL/hr over 30 Minutes Intravenous Every 8 hours 10/21/23 0844 10/24/23 1108   10/20/23 1900  ceFEPIme (MAXIPIME) 2 g in sodium chloride 0.9 % 100 mL IVPB  Status:  Discontinued        2 g 200 mL/hr over 30 Minutes Intravenous Every 12 hours 10/20/23 0912 10/21/23 0844   10/19/23 2300  ceFEPIme (MAXIPIME) 2 g in sodium chloride 0.9 % 100 mL IVPB  Status:  Discontinued        2 g 200 mL/hr over 30 Minutes Intravenous Every 8 hours 10/19/23 2157 10/20/23 0912   10/17/23 2200  ceFAZolin (ANCEF) IVPB 2g/100 mL premix        2 g 200 mL/hr over 30 Minutes Intravenous Every 8 hours 10/17/23 1541 10/18/23 1417   10/12/23 1745  ceFEPIme (MAXIPIME) 2 g in sodium chloride 0.9 % 100 mL IVPB  Status:  Discontinued        2 g 200 mL/hr over 30 Minutes Intravenous Every 8 hours 10/12/23 1319 10/14/23 1037   10/11/23 0945  ceFEPIme (MAXIPIME) 2 g in sodium chloride 0.9 % 100 mL IVPB  Status:  Discontinued        2 g 200 mL/hr over 30 Minutes Intravenous Every 12 hours 10/11/23 0846 10/12/23 1319        Assessment/Plan PHBC   S/P ex lap, pelvic packing (5 laps), and temporary abdominal closure with vicryl mesh by Dr. Dossie Der 11/12 - return to OR 11/13 for ex lap, removal of packs and Abthera placement by Dr. Janee Morn - extensive mesenteric contusions but bowel viable, S/P ex lap and closure 11/18 Dr. Bedelia Person. BID WTD midline. NGT clamped and having bowel function. D/c NGT if no further nausea later this afternoon - discussed plan with RN. SLP following for diet.  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 Vascular considering CTA next week if pt still inpatient.  R acetabular FX, B superior and inferior rami FXs, R iliac FX, L sacral FX - Dr. Hulda Humphrey consulted, R  femur in skeletal traction, S/P SI screw and L acetab perc fixation 11/20 by Dr. Jena Gauss. RLE traction as definitive management for a total of 4 weeks (~12/18) Pelvic hemorrhage - packed as above, S/P IR embolization by Dr. Elby Showers 11/12 Acute hypoxic respiratory failure - TRALI, extubated 11/24, guaifenesin for secretions. Added PT and flutter valve. Still having a lot of secretions, NT suction as needed. Cough improving. Add mucinex. Duonebs prn.  Right gluteal hematoma Right psoas hematoma Right 11th rib fracture Multiple lumbar vertebral body transverse process fractures  Right scalp hematoma ABL anemia - cbc pending Tachycardia - Unclear etiology. Sinus tach on monitor. Start with basic labs. Afebrile. On RA. On tele.  ID - Completed 7 day course for enterobacter and kleb pna. None currently.  FEN - PP cortrak. FWF for hypernatremia decreased to q4h 12/2 - check BMP. D/c NGT this PM if no further nausea. On Reglan. SLP following for diet.  Agitation - Off precedex. Seroquel and Klonopin weaned 12/2 -> Seroquel 50 mg am, 100 mg at bedtime; Klonopin 0.5mg  BID. Add prn Haldol.  VTE - SCDs, LMWH Dispo - 4NP. Basic Labs. Ortho following.   I reviewed nursing notes, last 24 h vitals and pain scores, last 48 h intake and output, last 24 h labs and trends, and last 24 h imaging results.   LOS: 22 days   Jacinto Halim, [facility] 10/30/2023, 10:01 AM Please see Amion for pager number during day hours 7:00am-4:30pm

## 2023-10-30 NOTE — Progress Notes (Addendum)
  Progress Note    10/30/2023 7:07 AM 13 Days Post-Op  Subjective:  awake, he's hungry and wants to eat.  Wants the tube out of his nose.    afebrile  Vitals:   10/30/23 0400 10/30/23 0500  BP: 118/64 138/71  Pulse: (!) 101 (!) 102  Resp: 20 20  Temp:    SpO2: 99% 98%    Physical Exam: General:  no distress Cardiac:  regular Lungs:  non labored Incisions:  right groin is soft without hematoma Extremities:  palpable bilateral radial and DP pulses   CBC    Component Value Date/Time   WBC 12.4 (H) 10/29/2023 0654   RBC 4.04 (L) 10/29/2023 0654   HGB 10.7 (L) 10/29/2023 0654   HCT 35.4 (L) 10/29/2023 0654   PLT 548 (H) 10/29/2023 0654   MCV 87.6 10/29/2023 0654   MCH 26.5 10/29/2023 0654   MCHC 30.2 10/29/2023 0654   RDW 17.4 (H) 10/29/2023 0654   LYMPHSABS 0.9 10/15/2023 1848   MONOABS 1.6 (H) 10/15/2023 1848   EOSABS 0.0 10/15/2023 1848   BASOSABS 0.1 10/15/2023 1848    BMET    Component Value Date/Time   NA 141 10/29/2023 0654   K 4.2 10/29/2023 0654   CL 109 10/29/2023 0654   CO2 24 10/29/2023 0654   GLUCOSE 159 (H) 10/29/2023 0654   BUN 36 (H) 10/29/2023 0654   CREATININE 0.88 10/29/2023 0654   CALCIUM 8.4 (L) 10/29/2023 0654   GFRNONAA >60 10/29/2023 0654    INR    Component Value Date/Time   INR 1.5 (H) 10/09/2023 0312     Intake/Output Summary (Last 24 hours) at 10/30/2023 0707 Last data filed at 10/30/2023 1308 Gross per 24 hour  Intake 2837.33 ml  Output 2320 ml  Net 517.33 ml      Assessment/Plan:  62 y.o. 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 Karin Lieu  13 Days Post-Op   -pt doing well from vascular standpoint with palpable DP/radial pulses.  Right groin soft without hematoma. -if still hospitalized next week, may be able to get his f/u CTA to evaluate stent graft.     Doreatha Massed, PA-C [facility] [phone] 10/30/2023 7:07 AM  VASCULAR STAFF ADDENDUM: Pt  creatinine normal.  Please order CT angio chest prior to discharge for review of Aortic repair.  Victorino Sparrow MD [facility] Phone Number: [phone] 10/30/2023 10:04 AM

## 2023-10-30 NOTE — Plan of Care (Signed)
  Problem: Education: Goal: Knowledge of General Education information will improve Description: Including pain rating scale, medication(s)/side effects and non-pharmacologic comfort measures Outcome: Progressing   Problem: Clinical Measurements: Goal: Will remain free from infection Outcome: Progressing Goal: Respiratory complications will improve Outcome: Progressing   Problem: Nutrition: Goal: Adequate nutrition will be maintained Outcome: Progressing   Problem: Skin Integrity: Goal: Risk for impaired skin integrity will decrease Outcome: Progressing   Problem: Activity: Goal: Ability to ambulate and perform ADLs will improve Outcome: Progressing   Problem: Clinical Measurements: Goal: Postoperative complications will be avoided or minimized Outcome: Progressing   Problem: Self-Concept: Goal: Ability to maintain and perform role responsibilities to the fullest extent possible will improve Outcome: Progressing   Problem: Pain Management: Goal: Pain level will decrease Outcome: Progressing

## 2023-10-31 ENCOUNTER — Inpatient Hospital Stay (HOSPITAL_COMMUNITY): Payer: MEDICAID

## 2023-10-31 LAB — GLUCOSE, CAPILLARY
Glucose-Capillary: 135 mg/dL — ABNORMAL HIGH (ref 70–99)
Glucose-Capillary: 133 mg/dL — ABNORMAL HIGH (ref 70–99)
Glucose-Capillary: 144 mg/dL — ABNORMAL HIGH (ref 70–99)
Glucose-Capillary: 108 mg/dL — ABNORMAL HIGH (ref 70–99)
Glucose-Capillary: 147 mg/dL — ABNORMAL HIGH (ref 70–99)
Glucose-Capillary: 138 mg/dL — ABNORMAL HIGH (ref 70–99)

## 2023-10-31 NOTE — Progress Notes (Signed)
Trauma/Critical Care Follow Up Note  Subjective:    Overnight Issues:   Objective:  Vital signs for last 24 hours: Temp:  [97.6 F (36.4 C)-98.8 F (37.1 C)] 98.8 F (37.1 C) (12/04 0759) Pulse Rate:  [99-115] 101 (12/04 0759) Resp:  [18-30] 18 (12/04 0818) BP: (108-141)/(58-90) 131/82 (12/04 0759) SpO2:  [96 %-100 %] 98 % (12/04 0759)  Hemodynamic parameters for last 24 hours:    Intake/Output from previous day: 12/03 0701 - 12/04 0700 In: 1915 [NG/GT:1915] Out: 1550 [Urine:1550]  Intake/Output this shift: No intake/output data recorded.  Vent settings for last 24 hours:    Physical Exam:  Gen: comfortable, no distress Neuro: sleepy HEENT: PERRL Neck: supple CV: tachycardic Pulm: unlabored breathing on RA Abd: soft, NT, midline wound with granulation tissue  GU: urine clear and yellow, +spontaneous voids Extr: wwp, no edema  Results for orders placed or performed during the hospital encounter of 10/08/23 (from the past 24 hour(s))  Basic metabolic panel     Status: Abnormal   Collection Time: 10/30/23 11:20 AM  Result Value Ref Range   Sodium 138 135 - 145 mmol/L   Potassium 4.0 3.5 - 5.1 mmol/L   Chloride 106 98 - 111 mmol/L   CO2 24 22 - 32 mmol/L   Glucose, Bld 173 (H) 70 - 99 mg/dL   BUN 36 (H) 8 - 23 mg/dL   Creatinine, Ser 4.09 0.61 - 1.24 mg/dL   Calcium 8.4 (L) 8.9 - 10.3 mg/dL   GFR, Estimated >81 >19 mL/min   Anion gap 8 5 - 15  CBC     Status: Abnormal   Collection Time: 10/30/23 11:20 AM  Result Value Ref Range   WBC 13.0 (H) 4.0 - 10.5 K/uL   RBC 3.90 (L) 4.22 - 5.81 MIL/uL   Hemoglobin 10.5 (L) 13.0 - 17.0 g/dL   HCT 14.7 (L) 82.9 - 56.2 %   MCV 86.2 80.0 - 100.0 fL   MCH 26.9 26.0 - 34.0 pg   MCHC 31.3 30.0 - 36.0 g/dL   RDW 13.0 (H) 86.5 - 78.4 %   Platelets 526 (H) 150 - 400 K/uL   nRBC 0.0 0.0 - 0.2 %  Glucose, capillary     Status: Abnormal   Collection Time: 10/30/23 12:16 PM  Result Value Ref Range   Glucose-Capillary  178 (H) 70 - 99 mg/dL  Glucose, capillary     Status: Abnormal   Collection Time: 10/30/23  3:36 PM  Result Value Ref Range   Glucose-Capillary 127 (H) 70 - 99 mg/dL  Glucose, capillary     Status: Abnormal   Collection Time: 10/30/23  7:57 PM  Result Value Ref Range   Glucose-Capillary 149 (H) 70 - 99 mg/dL  Glucose, capillary     Status: Abnormal   Collection Time: 10/30/23 11:24 PM  Result Value Ref Range   Glucose-Capillary 155 (H) 70 - 99 mg/dL  Glucose, capillary     Status: Abnormal   Collection Time: 10/31/23  3:08 AM  Result Value Ref Range   Glucose-Capillary 135 (H) 70 - 99 mg/dL  Glucose, capillary     Status: Abnormal   Collection Time: 10/31/23  7:56 AM  Result Value Ref Range   Glucose-Capillary 133 (H) 70 - 99 mg/dL    Assessment & Plan: The plan of care was discussed with the bedside nurse for the day, who is in agreement with this plan and no additional concerns were raised.   Present on Admission: **  None**    LOS: 23 days   Additional comments:I reviewed the patient's new clinical lab test results.   and I reviewed the patients new imaging test results.    PHBC   S/P ex lap, pelvic packing (5 laps), and temporary abdominal closure with vicryl mesh by Dr. Dossie Der 11/12 - return to OR 11/13 for ex lap, removal of packs and Abthera placement by Dr. Janee Morn - extensive mesenteric contusions but bowel viable, S/P ex lap and closure 11/18 Dr. Bedelia Person. BID WTD midline. NGT clamped and having bowel function. D/c NGT if no further nausea later this afternoon - discussed plan with RN. SLP following for diet.  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 Vascular considering CTA next week if pt still inpatient.  R acetabular FX, B superior and inferior rami FXs, R iliac FX, L sacral FX - Dr. Hulda Humphrey consulted, R femur in skeletal traction, S/P SI screw and L acetab perc fixation 11/20 by Dr. Jena Gauss. RLE traction as definitive management for a total of 4 weeks  (~12/18) Pelvic hemorrhage - packed as above, S/P IR embolization by Dr. Elby Showers 11/12 Acute hypoxic respiratory failure - TRALI, extubated 11/24, guaifenesin for secretions. Added PT and flutter valve. Still having a lot of secretions, NT suction as needed. Cough improving. Add mucinex. Duonebs prn.  Right gluteal hematoma Right psoas hematoma Right 11th rib fracture Multiple lumbar vertebral body transverse process fractures Right scalp hematoma ABL anemia - cbc pending Tachycardia - Unclear etiology. Sinus tach on monitor. Start with basic labs. Afebrile. On RA. On tele.  ID - Completed 7 day course for enterobacter and kleb pna. None currently.  FEN - PP cortrak. FWF for hypernatremia. NGT out, SLP following for diet, okay for diet advancement Agitation - Off precedex. Seroquel and Klonopin weaned 12/2 -> Seroquel 50 mg am, 100 mg at bedtime; Klonopin 0.5mg  BID. Add prn Haldol.  VTE - SCDs, LMWH Dispo - 4NP. Basic Labs. Ortho following.   Diamantina Monks, MD [facility] Please use [email or web address] to contact on call provider  10/31/2023  *Care during the described time interval was provided by me. I have reviewed this patient's available data, including medical history, events of note, physical examination and test results as part of my evaluation.

## 2023-10-31 NOTE — Procedures (Signed)
Objective Swallowing Evaluation: Type of Study: Bedside Swallow Evaluation   Patient Details  Name: [name] [name] MRN: [id number] Date of Birth: 06/19/1961  Today's Date: 10/31/2023 Time: SLP Start Time (ACUTE ONLY): 1130 -SLP Stop Time (ACUTE ONLY): 1215  SLP Time Calculation (min) (ACUTE ONLY): 45 min   Past Medical History: History reviewed. No pertinent past medical history. Past Surgical History:  Past Surgical History:  Procedure Laterality Date   EMBOLIZATION Bilateral 10/09/2023   Procedure: bilateral internal iliac embolization;  Surgeon: Bennie Dallas, MD;  Location: [facility] OR;  Service: Radiology;  Laterality: Bilateral;   INSERTION OF TRACTION PIN Right 10/08/2023   Procedure: INSERTION OF TRACTION PIN RIGHT FEMUR;  Surgeon: Luci Bank, MD;  Location: [facility] OR;  Service: Orthopedics;  Laterality: Right;   IR ANGIOGRAM PELVIS SELECTIVE OR SUPRASELECTIVE  10/09/2023   IR ANGIOGRAM SELECTIVE EACH ADDITIONAL VESSEL  10/09/2023   IR ANGIOGRAM SELECTIVE EACH ADDITIONAL VESSEL  10/09/2023   IR HYBRID TRAUMA EMBOLIZATION  10/09/2023   IR US GUIDE VASC ACCESS LEFT  10/09/2023   LAPAROTOMY N/A 10/08/2023   Procedure: EXPLORATORY LAPAROTOMY, PELVIC PACKING, TEMPORARY ABDOMINAL CLOSURE; EXPLORATION OF PELVIS;  Surgeon: Quentin Ore, MD;  Location: [facility] OR;  Service: General;  Laterality: N/A;   LAPAROTOMY N/A 10/10/2023   Procedure: EXPLORATORY LAPAROTOMY - OPEN ABDOMEN ABTHERA;  Surgeon: Violeta Gelinas, MD;  Location: [facility] OR;  Service: General;  Laterality: N/A;   LAPAROTOMY N/A 10/12/2023   Procedure: EXPLORATORY LAPAROTOMY WITH VAC CHANGE;  Surgeon: Violeta Gelinas, MD;  Location: [facility] OR;  Service: General;  Laterality: N/A;   LAPAROTOMY N/A 10/15/2023   Procedure: EXPLORATORY LAPAROTOMY;  Surgeon: Diamantina Monks, MD;  Location: [facility] OR;  Service: General;  Laterality: N/A;   OPEN REDUCTION INTERNAL FIXATION ACETABULUM FRACTURE POSTERIOR Right 10/17/2023   Procedure: OPEN  REDUCTION INTERNAL FIXATION  RIGHT ACETABULUM FRACTURE;  Surgeon: Roby Lofts, MD;  Location: [facility] OR;  Service: Orthopedics;  Laterality: Right;   ORIF PELVIC FRACTURE Bilateral 10/17/2023   Procedure: OPEN REDUCTION INTERNAL FIXATION (ORIF) PELVIC FRACTURE;  Surgeon: Roby Lofts, MD;  Location: [facility] OR;  Service: Orthopedics;  Laterality: Bilateral;   THORACIC AORTIC ENDOVASCULAR STENT GRAFT N/A 10/09/2023   Procedure: THORACIC AORTIC ENDOVASCULAR STENT GRAFT;  Surgeon: Victorino Sparrow, MD;  Location: [facility] OR;  Service: Vascular;  Laterality: N/A;   WOUND EXPLORATION  10/08/2023   Procedure: EXPLORATION OF PELVIS;  Surgeon: Despina Arias, MD;  Location: [facility] OR;  Service: Urology;;   HPI: Pt is a 62 yo male presenting 11/11 as PHBC with pelvic hemorrhage s/p multiple ex laps (11/12, 11/13, 11/18 with closure) and aortic transsection (s/p TEVAR 11/12). Pt also found to have R acetabular fx, B superior and inferior rami fxs, R iliac fx, L sacral fx (R femur in traction, s/p SI screw and L acetab perc fixation 11/20), R rib fx, multiple lumbar vertebral body transverse process fxs, and multiple hematomas (R gluteal, R psoas, R scalp). ETT 11/11-11/24. PMH includes: PNA, alcohol abuse, anxiety, depression, GERD, CVA, sarcoidosis of lung, chronic cough   Subjective: pleasant, alert, cooperative    Recommendations for follow up therapy are one component of a multi-disciplinary discharge planning process, led by the attending physician.  Recommendations may be updated based on patient status, additional functional criteria and insurance authorization.  Assessment / Plan / Recommendation     10/31/2023    2:26 PM  Clinical Impressions  Clinical Impression After SLP advanced FEES scope (Fiberoptic Endoscopic Evaluation  of Swallowing) through the right nare, it was advanced without complication and positioned with a supervior view of the pharynx, larynx and trachea. Secretions adhered to posterior  pharynx which were whitish-yellow in color as well as mild amount of clear secretions throughout pharynx. Arytenoid cartilage appeared edematous with right arytenoid appearing slightly more edematous than left) but were functional. Patient achieved good adduction of vocal cords which were symmetrical in appearance. An excresence on left vocal cord was visualized(unable to visualize if on right vocal cord as well) likely caused from endotracheal tube placement. A raised bump of same color as surrounding tissue was visualized in laryngeal vestibule superior to vocal cords. Patient given PO's of puree solids, mechanical soft solids, nectar thick liquids, thin liquids and ice chips. Swallow was initiated at level of pyriform sinus for thin and nectar thick liquids, initiated at level of vallecular sinus with pureer solids and intitiated with head of bolus at level of posterior laryngeal surface of epiglottis with mechanical soft solids. Mild amount of residuals remained in pyriform sinus with liquids and trace to mild residuals remained in pyriform sinus and interarytenoid space with puree solids, thin liquids and mechanical soft solids. Penetration to the vocal cords with suspected aspiration occured with thin liquids but patient was sensate to this and cough was effective to expel penetrate from laryngeal vestibule. Penetration above the vocal cords occured fairly consistently with thin liquids. No penetration observed with nectar thick liquids, puree solids or mechanical soft solids. SLP is recommending initiate PO diet of Dys 2 (minced) solids and nectar thick liquids with full supervision. SLP will follow for toleration and ability to advance.         10/31/2023    2:06 PM  Treatment Recommendations  Treatment Recommendations Therapy as outlined in treatment plan below        10/31/2023    2:38 PM  Prognosis  Prognosis for improved oropharyngeal function Good  Barriers to Reach Goals Cognitive  deficits;Time post onset       10/31/2023    2:06 PM  Diet Recommendations  SLP Diet Recommendations Nectar thick liquid;Dysphagia 2 (Fine chop) solids  Liquid Administration via Cup;Straw  Medication Administration Crushed with puree  Compensations Slow rate;Small sips/bites;Minimize environmental distractions         10/31/2023    2:06 PM  Other Recommendations  Oral Care Recommendations Oral care BID;Staff/trained caregiver to provide oral care  Caregiver Recommendations Have oral suction available  Follow Up Recommendations Acute inpatient rehab (3hours/day)  Functional Status Assessment Patient has had a recent decline in their functional status and demonstrates the ability to make significant improvements in function in a reasonable and predictable amount of time.       10/31/2023    2:06 PM  Frequency and Duration   Speech Therapy Frequency (ACUTE ONLY) min 2x/week  Treatment Duration 2 weeks         10/31/2023    1:53 PM  Oral Phase  Oral Phase Impaired  Oral - Nectar Teaspoon WFL  Oral - Nectar Cup Left anterior bolus loss  Oral - Nectar Straw WFL  Oral - Thin Teaspoon Left anterior bolus loss  Oral - Thin Cup Left anterior bolus loss  Oral - Thin Straw WFL  Oral - Puree WFL  Oral - Mech Soft Impaired mastication;Reduced posterior propulsion       10/31/2023    1:55 PM  Pharyngeal Phase  Pharyngeal Phase Impaired  Pharyngeal- Honey Teaspoon NT  Pharyngeal- Honey Cup NT  Pharyngeal- Nectar Teaspoon Delayed swallow initiation-vallecula  Pharyngeal Material does not enter airway  Pharyngeal- Nectar Cup Delayed swallow initiation-pyriform sinuses;Pharyngeal residue - pyriform;Pharyngeal residue - posterior pharnyx  Pharyngeal Material does not enter airway  Pharyngeal- Nectar Straw Delayed swallow initiation-pyriform sinuses;Pharyngeal residue - posterior pharnyx;Pharyngeal residue - pyriform  Pharyngeal Material does not enter airway  Pharyngeal- Thin  Teaspoon NT  Pharyngeal- Thin Cup Delayed swallow initiation-pyriform sinuses;Trace aspiration;Reduced airway/laryngeal closure;Pharyngeal residue - pyriform;Pharyngeal residue - posterior pharnyx  Pharyngeal Material enters airway, passes BELOW cords then ejected out;Material enters airway, CONTACTS cords and then ejected out  Pharyngeal- Thin Straw NT  Pharyngeal- Puree Delayed swallow initiation-vallecula  Pharyngeal- Mechanical Soft Delayed swallow initiation-vallecula;Pharyngeal residue - pyriform;Delayed swallow initiation-pyriform sinuses              Angela Nevin, MA, CCC-SLP Speech Therapy

## 2023-11-01 ENCOUNTER — Inpatient Hospital Stay (HOSPITAL_COMMUNITY): Payer: MEDICAID

## 2023-11-01 LAB — GLUCOSE, CAPILLARY
Glucose-Capillary: 131 mg/dL — ABNORMAL HIGH (ref 70–99)
Glucose-Capillary: 69 mg/dL — ABNORMAL LOW (ref 70–99)
Glucose-Capillary: 131 mg/dL — ABNORMAL HIGH (ref 70–99)
Glucose-Capillary: 100 mg/dL — ABNORMAL HIGH (ref 70–99)
Glucose-Capillary: 146 mg/dL — ABNORMAL HIGH (ref 70–99)
Glucose-Capillary: 155 mg/dL — ABNORMAL HIGH (ref 70–99)

## 2023-11-01 MED ORDER — IPRATROPIUM-ALBUTEROL 0.5-2.5 (3) MG/3ML IN SOLN
3.0000 mL | Freq: Once | RESPIRATORY_TRACT | Status: AC
  Administered 2023-11-01: 3 mL via RESPIRATORY_TRACT
  Filled 2023-11-01: qty 3

## 2023-11-01 MED ORDER — ENSURE ENLIVE PO LIQD
237.0000 mL | Freq: Two times a day (BID) | ORAL | Status: DC
Start: 2023-11-01 — End: 2023-12-17
  Administered 2023-11-01 – 2023-12-17 (×68): 237 mL via ORAL
  Filled 2023-11-01 (×2): qty 237
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
## 2023-11-02 LAB — GLUCOSE, CAPILLARY
Glucose-Capillary: 135 mg/dL — ABNORMAL HIGH (ref 70–99)
Glucose-Capillary: 132 mg/dL — ABNORMAL HIGH (ref 70–99)
Glucose-Capillary: 120 mg/dL — ABNORMAL HIGH (ref 70–99)
Glucose-Capillary: 113 mg/dL — ABNORMAL HIGH (ref 70–99)
Glucose-Capillary: 152 mg/dL — ABNORMAL HIGH (ref 70–99)
Glucose-Capillary: 103 mg/dL — ABNORMAL HIGH (ref 70–99)

## 2023-11-02 MED ORDER — METOCLOPRAMIDE HCL 5 MG/5ML PO SOLN
5.0000 mg | Freq: Four times a day (QID) | ORAL | Status: DC
  Administered 2023-11-02 – 2023-11-03 (×4): 5 mg
  Filled 2023-11-02 (×5): qty 10

## 2023-11-02 MED ORDER — HALOPERIDOL LACTATE 5 MG/ML IJ SOLN
5.0000 mg | Freq: Two times a day (BID) | INTRAMUSCULAR | Status: AC | PRN
  Filled 2023-11-02: qty 1

## 2023-11-02 MED ORDER — BETHANECHOL CHLORIDE 10 MG PO TABS
5.0000 mg | ORAL_TABLET | Freq: Three times a day (TID) | ORAL | Status: DC
  Administered 2023-11-02 – 2023-11-05 (×10): 5 mg
  Filled 2023-11-02 (×9): qty 1

## 2023-11-02 MED ORDER — HALOPERIDOL 5 MG PO TABS
5.0000 mg | ORAL_TABLET | Freq: Two times a day (BID) | ORAL | Status: AC | PRN
Start: 2023-11-02 — End: 2023-11-03
  Administered 2023-11-02 – 2023-11-03 (×2): 5 mg
  Filled 2023-11-02 (×3): qty 1

## 2023-11-02 NOTE — Progress Notes (Addendum)
Nutrition Brief Note  Last Nutrition follow up was 12/5. Tube feeds stopped yesterday due to pt vomiting. Spoke to PA and ok to resume tube feeds. Per Meds History tube feeds were restarted at 1930 last night. Tube feeds running at goal rate of Pivot 1.5 @ 65 ml/hr.   INTERVENTION:    - Continue Tube feeding via post pyloric cortrak tube: Pivot 1.5 at 65 ml/h (1560 ml per day) Prosource TF20 60 ml daily   Provides: 2420 kcal, 166 grams protein, and 1170 ml free water.    200 ml free water every 4 hours Total free water: 2,370 ml    - Replace Vitamin D for deficiency 1000 IU daily x 30 days   - Once diet advances: Recommend adding Ensure Enlive BID and Magic cup TID   Recommend placing pt on feeding with assist due to pt spilling food on himself when eating    NUTRITION DIAGNOSIS:    Moderate Malnutrition (in the context of acute illness) related to  (inadequate energy intake) as evidenced by mild fat depletion, moderate fat depletion.     GOAL:    Patient will meet greater than or equal to 90% of their needs    If nutrition issues arise, please consult RD inpatient secure chat.   Elliot Dally, RD Registered Dietitian  See Amion for more information

## 2023-11-02 NOTE — Progress Notes (Signed)
Bladder scanned patient.  retention on bladder scanner. 57fr foley urinary catheter placed at bedside without complications.
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
## 2023-11-03 ENCOUNTER — Inpatient Hospital Stay (HOSPITAL_COMMUNITY): Payer: MEDICAID

## 2023-11-03 LAB — GLUCOSE, CAPILLARY
Glucose-Capillary: 113 mg/dL — ABNORMAL HIGH (ref 70–99)
Glucose-Capillary: 118 mg/dL — ABNORMAL HIGH (ref 70–99)
Glucose-Capillary: 119 mg/dL — ABNORMAL HIGH (ref 70–99)
Glucose-Capillary: 128 mg/dL — ABNORMAL HIGH (ref 70–99)
Glucose-Capillary: 150 mg/dL — ABNORMAL HIGH (ref 70–99)
Glucose-Capillary: 85 mg/dL (ref 70–99)

## 2023-11-03 LAB — CBC
HCT: 36.7 % — ABNORMAL LOW (ref 39.0–52.0)
Hemoglobin: 11.4 g/dL — ABNORMAL LOW (ref 13.0–17.0)
MCH: 26.3 pg (ref 26.0–34.0)
MCHC: 31.1 g/dL (ref 30.0–36.0)
MCV: 84.6 fL (ref 80.0–100.0)
Platelets: 437 10*3/uL — ABNORMAL HIGH (ref 150–400)
RBC: 4.34 MIL/uL (ref 4.22–5.81)
RDW: 17.1 % — ABNORMAL HIGH (ref 11.5–15.5)
WBC: 11.1 10*3/uL — ABNORMAL HIGH (ref 4.0–10.5)
nRBC: 0 % (ref 0.0–0.2)

## 2023-11-03 LAB — BASIC METABOLIC PANEL
Anion gap: 8 (ref 5–15)
BUN: 33 mg/dL — ABNORMAL HIGH (ref 8–23)
CO2: 24 mmol/L (ref 22–32)
Calcium: 8.6 mg/dL — ABNORMAL LOW (ref 8.9–10.3)
Chloride: 99 mmol/L (ref 98–111)
Creatinine, Ser: 0.83 mg/dL (ref 0.61–1.24)
GFR, Estimated: >60 mL/min (ref >60)
Glucose, Bld: 136 mg/dL — ABNORMAL HIGH (ref 70–99)
Potassium: 4.3 mmol/L (ref 3.5–5.1)
Sodium: 131 mmol/L — ABNORMAL LOW (ref 135–145)

## 2023-11-03 MED ORDER — PIVOT 1.5 CAL PO LIQD: 1000.0000 mL | ORAL | Status: DC

## 2023-11-03 MED ORDER — THIAMINE HCL 100 MG/ML IJ SOLN: 100.0000 mg | Freq: Every day | INTRAMUSCULAR | Status: DC

## 2023-11-03 MED ORDER — KCL IN DEXTROSE-NACL 20-5-0.9 MEQ/L-%-% IV SOLN
INTRAVENOUS | Status: DC
  Filled 2023-11-03 (×3): qty 1000

## 2023-11-03 MED ORDER — FREE WATER: 100.0000 mL | Freq: Four times a day (QID) | Status: DC

## 2023-11-03 MED ORDER — METOCLOPRAMIDE HCL 5 MG/ML IJ SOLN
5.0000 mg | Freq: Four times a day (QID) | INTRAMUSCULAR | Status: AC
  Administered 2023-11-03 – 2023-11-07 (×14): 5 mg via INTRAVENOUS
  Filled 2023-11-03 (×14): qty 2

## 2023-11-03 MED ORDER — THIAMINE MONONITRATE 100 MG PO TABS
100.0000 mg | ORAL_TABLET | Freq: Every day | ORAL | Status: DC
  Administered 2023-11-04 – 2023-11-05 (×2): 100 mg
  Filled 2023-11-03: qty 1

## 2023-11-03 MED ORDER — POLYETHYLENE GLYCOL 3350 17 G PO PACK
17.0000 g | PACK | Freq: Two times a day (BID) | ORAL | Status: DC
Start: 2023-11-03 — End: 2023-11-03

## 2023-11-03 MED ORDER — MELATONIN 3 MG PO TABS
3.0000 mg | ORAL_TABLET | Freq: Every day | ORAL | Status: DC
Start: 2023-11-04 — End: 2023-11-05
  Administered 2023-11-04: 3 mg
  Filled 2023-11-03: qty 1
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
## 2023-11-03 NOTE — Progress Notes (Signed)
Pt had vomiting episode, cortrak came out, was removed by this nurse and PA. Pt cleaned up and NG tube will be placed. X-ray followed up IMPRESSION: New enteric tube tip projects over the proximal stomach. Distention of stomach appears to be decreasing. [name] [name]. 11/03/23 1:05 PM

## 2023-11-03 NOTE — Progress Notes (Addendum)
17 Days Post-Op  Subjective: Feels very bloated today.  No other complaints.  Plain film ordered and reviewed and then patient began vomiting profusely and I returned to his room to help suction and sit him up.  He vomited his cortrak out of his mouth.  This was removed.   No BM in 2 days, last on 12/5  Objective: Vital signs in last 24 hours: Temp:  [98.2 F (36.8 C)-98.7 F (37.1 C)] 98.3 F (36.8 C) (12/07 0304) Pulse Rate:  [101-197] 197 (12/07 0800) Resp:  [17-20] 19 (12/07 0304) BP: (113-153)/(69-88) 128/83 (12/07 0800) SpO2:  [91 %-100 %] 96 % (12/07 0800) Weight:  [55.2 kg] 55.2 kg (12/07 0500) Last BM Date : 11/02/23  Intake/Output from previous day: 12/06 0701 - 12/07 0700 In: 8441.6 [NG/GT:8441.6] Out: 1500 [Urine:1500] Intake/Output this shift: No intake/output data recorded.  PE: Gen:  Alert, NAD, until he started vomiting, pleasant HEENT: Pupils equal and round. EOMI Heart: RRR Lungs:  Respiratory effort nonlabored on RA. Rhonci B/l. No wheezing. No obvious rales Abd: much more distended today.  TFs stopped.  Midline wound is stable with fibrin at the base and some separation of his fascial sutures, but not significant. MSK:  RLE in traction. No LE edema. Neuro: CN 3-12 grossly intact. F/c x 4 ext. Skin: warm and dry  Lab Results:  Recent Labs    11/03/23 0338  WBC 11.1*  HGB 11.4*  HCT 36.7*  PLT 437*   BMET Recent Labs    11/03/23 0338  NA 131*  K 4.3  CL 99  CO2 24  GLUCOSE 136*  BUN 33*  CREATININE 0.83  CALCIUM 8.6*   PT/INR No results for input(s): "LABPROT", "INR" in the last 72 hours. CMP     Component Value Date/Time   NA 131 (L) 11/03/2023 0338   K 4.3 11/03/2023 0338   CL 99 11/03/2023 0338   CO2 24 11/03/2023 0338   GLUCOSE 136 (H) 11/03/2023 0338   BUN 33 (H) 11/03/2023 0338   CREATININE 0.83 11/03/2023 0338   CALCIUM 8.6 (L) 11/03/2023 0338   PROT 7.1 10/25/2023 0520   ALBUMIN 2.4 (L) 10/25/2023 0520   AST 47 (H)  10/25/2023 0520   ALT 45 (H) 10/25/2023 0520   ALKPHOS 127 (H) 10/25/2023 0520   BILITOT 2.5 (H) 10/25/2023 0520   GFRNONAA >60 11/03/2023 0338   Lipase  No results found for: "LIPASE"  Studies/Results: DG Abd Portable 1V  Result Date: 11/01/2023 CLINICAL DATA:  Vomiting. EXAM: PORTABLE ABDOMEN - 1 VIEW COMPARISON:  Abdominal radiograph dated 10/26/2023. FINDINGS: Feeding tube with tip in the left upper abdomen, likely in the distal duodenum or proximal jejunum. Interval removal of the NG tube. No bowel dilatation or evidence of obstruction. Air is noted throughout the colon. Complex pelvic fractures with fixation screws as seen previously. IMPRESSION: 1. Nonobstructive bowel gas pattern. 2. Feeding tube with tip in the distal duodenum or proximal jejunum. Electronically Signed   By: Elgie Collard M.D.   On: 11/01/2023 18:27    Anti-infectives: Anti-infectives (From admission, onward)    Start     Dose/Rate Route Frequency Ordered Stop   10/24/23 1800  ceFEPIme (MAXIPIME) 2 g in sodium chloride 0.9 % 100 mL IVPB        2 g 200 mL/hr over 30 Minutes Intravenous Every 12 hours 10/24/23 1108 10/26/23 1813   10/21/23 1400  ceFEPIme (MAXIPIME) 2 g in sodium chloride 0.9 % 100 mL IVPB  Status:  Discontinued        2 g 200 mL/hr over 30 Minutes Intravenous Every 8 hours 10/21/23 0844 10/24/23 1108   10/20/23 1900  ceFEPIme (MAXIPIME) 2 g in sodium chloride 0.9 % 100 mL IVPB  Status:  Discontinued        2 g 200 mL/hr over 30 Minutes Intravenous Every 12 hours 10/20/23 0912 10/21/23 0844   10/19/23 2300  ceFEPIme (MAXIPIME) 2 g in sodium chloride 0.9 % 100 mL IVPB  Status:  Discontinued        2 g 200 mL/hr over 30 Minutes Intravenous Every 8 hours 10/19/23 2157 10/20/23 0912   10/17/23 2200  ceFAZolin (ANCEF) IVPB 2g/100 mL premix        2 g 200 mL/hr over 30 Minutes Intravenous Every 8 hours 10/17/23 1541 10/18/23 1417   10/12/23 1745  ceFEPIme (MAXIPIME) 2 g in sodium chloride 0.9 %  100 mL IVPB  Status:  Discontinued        2 g 200 mL/hr over 30 Minutes Intravenous Every 8 hours 10/12/23 1319 10/14/23 1037   10/11/23 0945  ceFEPIme (MAXIPIME) 2 g in sodium chloride 0.9 % 100 mL IVPB  Status:  Discontinued        2 g 200 mL/hr over 30 Minutes Intravenous Every 12 hours 10/11/23 0846 10/12/23 1319        Assessment/Plan PHBC   S/P ex lap, pelvic packing (5 laps), and temporary abdominal closure with vicryl mesh by Dr. Dossie Der 11/12 - return to OR 11/13 for ex lap, removal of packs and Abthera placement by Dr. Janee Morn - extensive mesenteric contusions but bowel viable, S/P ex lap and closure 11/18 Dr. Bedelia Person. BID WTD midline. Passed for D2 diet, but has continued to have N/V.  Cortrak out so will stop TFs, but also replace NGT for decompression to minimize his risk of aspiration.  X-ray ordered and reviewed personally showing more of a colonic ileus picture.  R colon with a stool burden present.  On miralax, but given vomiting, will hold for now. 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 Vascular considering CTA next week if pt still inpatient.  R acetabular FX, B superior and inferior rami FXs, R iliac FX, L sacral FX - Dr. Hulda Humphrey consulted, R femur in skeletal traction, S/P SI screw and L acetab perc fixation 11/20 by Dr. Jena Gauss. RLE traction as definitive management for a total of 4 weeks (~12/18) Pelvic hemorrhage - packed as above, S/P IR embolization by Dr. Elby Showers 11/12 Acute hypoxic respiratory failure - TRALI, extubated 11/24, guaifenesin for secretions. IS and flutter valve. NT suction as needed. Duoneb prn.  Right gluteal hematoma Right psoas hematoma Right 11th rib fracture Multiple lumbar vertebral body transverse process fractures Right scalp hematoma ABL anemia - Hgb stable.  Tachycardia - Improved  ID - Completed 7 day course for enterobacter and kleb pna. None currently. UA pending FEN - Passed for a D2 diet but currently NPO.  Cortrak out,  TFs on hold, NGT to be placed today.  Reglan in place for 5 days total.  Free water stopped given Cortrak out. Hyponatremia -  Also Na down to 131 today.  BMET in am Agitation - Off precedex. Seroquel and Klonopin weaned 12/2 -> Seroquel 50 mg am, 100 mg at bedtime; Klonopin 0.5mg  BID. PRN Haldol.  VTE - SCDs, LMWH Foley/urinary retention - Placed 12/6 for retention. Urecholine, UA specimen apparently not sent or lab hasn't processed for yesterday.  Will  resend today Dispo - 4NP. Ortho following. AM labs.  All care provided and discussed with nursing at the bedside today  I reviewed nursing notes, Consultant Vascular notes, last 24 h vitals and pain scores, last 48 h intake and output, last 24 h labs and trends, and last 24 h imaging results.   LOS: 26 days    Letha Cape , [facility] 11/03/2023, 10:19 AM Please see Amion for pager number during day hours 7:00am-4:30pm
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
## 2023-11-04 ENCOUNTER — Inpatient Hospital Stay (HOSPITAL_COMMUNITY): Payer: MEDICAID

## 2023-11-04 DIAGNOSIS — F430 Acute stress reaction: Secondary | ICD-10-CM | POA: Diagnosis present

## 2023-11-04 DIAGNOSIS — E440 Moderate protein-calorie malnutrition: Secondary | ICD-10-CM | POA: Diagnosis present

## 2023-11-04 LAB — CBC
HCT: 35.3 % — ABNORMAL LOW (ref 39.0–52.0)
Hemoglobin: 11 g/dL — ABNORMAL LOW (ref 13.0–17.0)
MCH: 26.6 pg (ref 26.0–34.0)
MCHC: 31.2 g/dL (ref 30.0–36.0)
MCV: 85.5 fL (ref 80.0–100.0)
Platelets: 385 10*3/uL (ref 150–400)
RBC: 4.13 MIL/uL — ABNORMAL LOW (ref 4.22–5.81)
RDW: 16.9 % — ABNORMAL HIGH (ref 11.5–15.5)
WBC: 9.2 10*3/uL (ref 4.0–10.5)
nRBC: 0 % (ref 0.0–0.2)

## 2023-11-04 LAB — BASIC METABOLIC PANEL
Anion gap: 8 (ref 5–15)
BUN: 25 mg/dL — ABNORMAL HIGH (ref 8–23)
CO2: 26 mmol/L (ref 22–32)
Calcium: 8.9 mg/dL (ref 8.9–10.3)
Chloride: 100 mmol/L (ref 98–111)
Creatinine, Ser: 0.77 mg/dL (ref 0.61–1.24)
GFR, Estimated: >60 mL/min (ref >60)
Glucose, Bld: 95 mg/dL (ref 70–99)
Potassium: 4.4 mmol/L (ref 3.5–5.1)
Sodium: 134 mmol/L — ABNORMAL LOW (ref 135–145)

## 2023-11-04 LAB — GLUCOSE, CAPILLARY
Glucose-Capillary: 130 mg/dL — ABNORMAL HIGH (ref 70–99)
Glucose-Capillary: 115 mg/dL — ABNORMAL HIGH (ref 70–99)
Glucose-Capillary: 141 mg/dL — ABNORMAL HIGH (ref 70–99)
Glucose-Capillary: 116 mg/dL — ABNORMAL HIGH (ref 70–99)
Glucose-Capillary: 107 mg/dL — ABNORMAL HIGH (ref 70–99)

## 2023-11-04 MED ORDER — DULOXETINE HCL 30 MG PO CPEP
30.0000 mg | ORAL_CAPSULE | Freq: Every day | ORAL | Status: DC
  Administered 2023-11-04 – 2023-11-08 (×5): 30 mg via ORAL
  Filled 2023-11-04 (×5): qty 1

## 2023-11-04 MED ORDER — TRAZODONE HCL 50 MG PO TABS
50.0000 mg | ORAL_TABLET | Freq: Every day | ORAL | Status: DC
  Administered 2023-11-04 – 2023-12-16 (×43): 50 mg via ORAL
  Filled 2023-11-04 (×43): qty 1

## 2023-11-04 MED ORDER — KCL IN DEXTROSE-NACL 20-5-0.9 MEQ/L-%-% IV SOLN: INTRAVENOUS | Status: DC

## 2023-11-04 MED ORDER — HYDROXYZINE HCL 25 MG PO TABS
25.0000 mg | ORAL_TABLET | Freq: Three times a day (TID) | ORAL | Status: DC | PRN
  Administered 2023-11-04 – 2023-11-08 (×6): 25 mg via ORAL
  Filled 2023-11-04 (×6): qty 1

## 2023-11-04 MED ORDER — DEXTROSE-SODIUM CHLORIDE 5-0.9 % IV SOLN: INTRAVENOUS | Status: AC

## 2023-11-04 MED ORDER — DEXTROSE-SODIUM CHLORIDE 5-0.9 % IV SOLN: INTRAVENOUS | Status: DC

## 2023-11-04 NOTE — Progress Notes (Signed)
18 Days Post-Op  Subjective: NGT came out overnight.  Replaced this morning, but in his lung, multiple attempts at replacement but unable to get it to pass into his stomach.  This was removed.  He has no other complaints this morning.  Objective: Vital signs in last 24 hours: Temp:  [97.4 F (36.3 C)-98.4 F (36.9 C)] 97.8 F (36.6 C) (12/08 0838) Pulse Rate:  [95-111] 95 (12/08 0838) Resp:  [18] 18 (12/08 0307) BP: (119-125)/(69-83) 124/80 (12/08 0838) SpO2:  [93 %-98 %] 94 % (12/08 0838) Weight:  [55.4 kg] 55.4 kg (12/08 0350) Last BM Date : 11/03/23  Intake/Output from previous day: 12/07 0701 - 12/08 0700 In: 1197.1 [I.V.:1197.1] Out: 3950 [Urine:2950; Emesis/NG output:1000] Intake/Output this shift: Total I/O In: -  Out: 350 [Urine:350]  PE: Gen:  Alert, NAD except while trying to place NGT, he was in distress from coughing and pain. HEENT: Pupils equal and round. EOMI Heart: RRR Lungs:  Respiratory effort nonlabored on RA. Rhonci B/l. No wheezing. No obvious rales Abd: much less distended today. Midline wound is stable with fibrin at the base and some separation of his fascial sutures, but not significant. MSK:  RLE in traction. No LE edema. Neuro: CN 3-12 grossly intact. F/c x 4 ext. Skin: warm and dry  Lab Results:  Recent Labs    11/03/23 0338 11/04/23 0451  WBC 11.1* 9.2  HGB 11.4* 11.0*  HCT 36.7* 35.3*  PLT 437* 385   BMET Recent Labs    11/03/23 0338 11/04/23 0451  NA 131* 134*  K 4.3 4.4  CL 99 100  CO2 24 26  GLUCOSE 136* 95  BUN 33* 25*  CREATININE 0.83 0.77  CALCIUM 8.6* 8.9   PT/INR No results for input(s): "LABPROT", "INR" in the last 72 hours. CMP     Component Value Date/Time   NA 134 (L) 11/04/2023 0451   K 4.4 11/04/2023 0451   CL 100 11/04/2023 0451   CO2 26 11/04/2023 0451   GLUCOSE 95 11/04/2023 0451   BUN 25 (H) 11/04/2023 0451   CREATININE 0.77 11/04/2023 0451   CALCIUM 8.9 11/04/2023 0451   PROT 7.1 10/25/2023  0520   ALBUMIN 2.4 (L) 10/25/2023 0520   AST 47 (H) 10/25/2023 0520   ALT 45 (H) 10/25/2023 0520   ALKPHOS 127 (H) 10/25/2023 0520   BILITOT 2.5 (H) 10/25/2023 0520   GFRNONAA >60 11/04/2023 0451   Lipase  No results found for: "LIPASE"  Studies/Results: DG Abd Portable 1V  Result Date: 11/04/2023 CLINICAL DATA:  Nasogastric tube placement. EXAM: PORTABLE ABDOMEN - 1 VIEW COMPARISON:  Yesterday FINDINGS: 9:37 a.m. Nasogastric tube has been retracted or replaced, terminating over the upper chest. Normal heart size. Aortic stent graft. Nonobstructive bowel-gas pattern. No gross free intraperitoneal air. IMPRESSION: Nasogastric tube terminating over the upper chest. Per technologist notes, the physician is at bedside and removed the tube immediately after radiograph was performed. Electronically Signed   By: Jeronimo Greaves M.D.   On: 11/04/2023 10:06   DG Abd 1 View  Result Date: 11/03/2023 CLINICAL DATA:  Nasogastric tube placement. EXAM: ABDOMEN - 1 VIEW COMPARISON:  Radiographs 11/03/2023 and 11/01/2023. FINDINGS: 1155 hours. Interval removal of the feeding tube. A new enteric tube has been placed, tip projecting over the left upper quadrant, likely in the proximal stomach. The visualized bowel gas pattern is nonobstructive. Thoracic aortic stent graft and patchy bilateral airspace opacities in both lungs are grossly stable. IMPRESSION: New enteric tube  tip projects over the proximal stomach. Electronically Signed   By: Carey Bullocks M.D.   On: 11/03/2023 12:05   DG Abd Portable 1V  Result Date: 11/03/2023 CLINICAL DATA:  Ileus EXAM: PORTABLE ABDOMEN - 1 VIEW COMPARISON:  11/01/2023. FINDINGS: Feeding tube tip overlies the left upper quadrant likely in the jejunum. Normal bowel gas pattern without significant dilatation to suggest obstruction. No change compared to the prior study. IMPRESSION: Nonobstructive bowel gas pattern. Electronically Signed   By: Layla Maw M.D.   On: 11/03/2023  10:50    Anti-infectives: Anti-infectives (From admission, onward)    Start     Dose/Rate Route Frequency Ordered Stop   10/24/23 1800  ceFEPIme (MAXIPIME) 2 g in sodium chloride 0.9 % 100 mL IVPB        2 g 200 mL/hr over 30 Minutes Intravenous Every 12 hours 10/24/23 1108 10/26/23 1813   10/21/23 1400  ceFEPIme (MAXIPIME) 2 g in sodium chloride 0.9 % 100 mL IVPB  Status:  Discontinued        2 g 200 mL/hr over 30 Minutes Intravenous Every 8 hours 10/21/23 0844 10/24/23 1108   10/20/23 1900  ceFEPIme (MAXIPIME) 2 g in sodium chloride 0.9 % 100 mL IVPB  Status:  Discontinued        2 g 200 mL/hr over 30 Minutes Intravenous Every 12 hours 10/20/23 0912 10/21/23 0844   10/19/23 2300  ceFEPIme (MAXIPIME) 2 g in sodium chloride 0.9 % 100 mL IVPB  Status:  Discontinued        2 g 200 mL/hr over 30 Minutes Intravenous Every 8 hours 10/19/23 2157 10/20/23 0912   10/17/23 2200  ceFAZolin (ANCEF) IVPB 2g/100 mL premix        2 g 200 mL/hr over 30 Minutes Intravenous Every 8 hours 10/17/23 1541 10/18/23 1417   10/12/23 1745  ceFEPIme (MAXIPIME) 2 g in sodium chloride 0.9 % 100 mL IVPB  Status:  Discontinued        2 g 200 mL/hr over 30 Minutes Intravenous Every 8 hours 10/12/23 1319 10/14/23 1037   10/11/23 0945  ceFEPIme (MAXIPIME) 2 g in sodium chloride 0.9 % 100 mL IVPB  Status:  Discontinued        2 g 200 mL/hr over 30 Minutes Intravenous Every 12 hours 10/11/23 0846 10/12/23 1319        Assessment/Plan PHBC   S/P ex lap, pelvic packing (5 laps), and temporary abdominal closure with vicryl mesh by Dr. Dossie Der 11/12 - return to OR 11/13 for ex lap, removal of packs and Abthera placement by Dr. Janee Morn - extensive mesenteric contusions but bowel viable, S/P ex lap and closure 11/18 Dr. Bedelia Person. BID WTD midline. Passed for D2 diet, but has continued to have N/V.  Both Cortrak and NGT are out.  Much less distended today.  Will keep NPO for today and IVFs and if N/V improved, maybe  start back over with CLD tomorrow? 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 Vascular considering CTA next week if pt still inpatient.  R acetabular FX, B superior and inferior rami FXs, R iliac FX, L sacral FX - Dr. Hulda Humphrey consulted, R femur in skeletal traction, S/P SI screw and L acetab perc fixation 11/20 by Dr. Jena Gauss. RLE traction as definitive management for a total of 4 weeks (~12/18) Pelvic hemorrhage - packed as above, S/P IR embolization by Dr. Elby Showers 11/12 Acute hypoxic respiratory failure - TRALI, extubated 11/24, guaifenesin for secretions. IS and flutter  valve. NT suction as needed. Duoneb prn.  Right gluteal hematoma Right psoas hematoma Right 11th rib fracture Multiple lumbar vertebral body transverse process fractures Right scalp hematoma ABL anemia - Hgb stable.  Tachycardia - Improved  ID - Completed 7 day course for enterobacter and kleb pna. None currently. FEN - Passed for a D2 diet but currently NPO.  Cortrak out, NGT out.  ? CLD tomorrow if continues without N/V.  IVFs running Agitation - Off precedex. Seroquel and Klonopin weaned 12/2 -> Seroquel 50 mg am, 100 mg at bedtime; Klonopin 0.5mg  BID. PRN Haldol.  VTE - SCDs, LMWH Foley/urinary retention - Placed 12/6 for retention. Urecholine, UA specimen apparently not sent or lab hasn't processed for yesterday.  Plan was to resend 12/7 but appears this wasn't done either. Dispo - 4NP. Ortho following.  All care provided and discussed with nursing at the bedside today  I reviewed nursing notes, Consultant Vascular notes, last 24 h vitals and pain scores, last 48 h intake and output, last 24 h labs and trends, and last 24 h imaging results.   LOS: 27 days    Letha Cape , [facility] 11/04/2023, 10:26 AM Please see Amion for pager number during day hours 7:00am-4:30pm

## 2023-11-04 NOTE — Progress Notes (Signed)
Performed wound care. No s/s of infection no foul odor.; pt pre-medicated before wound care performed. Pt tolerated without c/o pain or complication.  Reva Bores 11/04/23 2:17 PM

## 2023-11-04 NOTE — Progress Notes (Signed)
Pt c/o bladder distention. Abdominal area did not seem any different than this morning, encouraged patient to try voiding. Pt Bladder scanned with zero shown.  Reva Bores 11/04/23 5:18 PM
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
## 2023-11-04 NOTE — Progress Notes (Signed)
This RN rounded, found pt with NGT completely out. NGT replaced and KUB ordered

## 2023-11-04 NOTE — Plan of Care (Signed)
  Problem: Education: Goal: Knowledge of General Education information will improve Description: Including pain rating scale, medication(s)/side effects and non-pharmacologic comfort measures Outcome: Not Progressing   Problem: Clinical Measurements: Goal: Will remain free from infection Outcome: Not Progressing Goal: Respiratory complications will improve Outcome: Not Progressing   Problem: Nutrition: Goal: Adequate nutrition will be maintained Outcome: Not Progressing   Problem: Skin Integrity: Goal: Risk for impaired skin integrity will decrease Outcome: Not Progressing   Problem: Education: Goal: Verbalization of understanding the information provided (i.e., activity precautions, restrictions, etc) will improve Outcome: Not Progressing Goal: Individualized Educational Video(s) Outcome: Not Progressing

## 2023-11-05 LAB — URINALYSIS, W/ REFLEX TO CULTURE (INFECTION SUSPECTED)
Bilirubin Urine: NEGATIVE
Glucose, UA: NEGATIVE mg/dL
Hgb urine dipstick: NEGATIVE
Ketones, ur: NEGATIVE mg/dL
Leukocytes,Ua: NEGATIVE
Nitrite: NEGATIVE
Protein, ur: NEGATIVE mg/dL
Specific Gravity, Urine: 1.017 (ref 1.005–1.030)
pH: 5 (ref 5.0–8.0)

## 2023-11-05 LAB — GLUCOSE, CAPILLARY
Glucose-Capillary: 109 mg/dL — ABNORMAL HIGH (ref 70–99)
Glucose-Capillary: 111 mg/dL — ABNORMAL HIGH (ref 70–99)
Glucose-Capillary: 120 mg/dL — ABNORMAL HIGH (ref 70–99)
Glucose-Capillary: 152 mg/dL — ABNORMAL HIGH (ref 70–99)
Glucose-Capillary: 166 mg/dL — ABNORMAL HIGH (ref 70–99)
Glucose-Capillary: 89 mg/dL (ref 70–99)
Glucose-Capillary: 97 mg/dL (ref 70–99)

## 2023-11-05 LAB — BASIC METABOLIC PANEL
Anion gap: 8 (ref 5–15)
BUN: 17 mg/dL (ref 8–23)
CO2: 25 mmol/L (ref 22–32)
Calcium: 8.7 mg/dL — ABNORMAL LOW (ref 8.9–10.3)
Chloride: 101 mmol/L (ref 98–111)
Creatinine, Ser: 0.84 mg/dL (ref 0.61–1.24)
GFR, Estimated: >60 mL/min (ref >60)
Glucose, Bld: 104 mg/dL — ABNORMAL HIGH (ref 70–99)
Potassium: 4.1 mmol/L (ref 3.5–5.1)
Sodium: 134 mmol/L — ABNORMAL LOW (ref 135–145)

## 2023-11-05 MED ORDER — QUETIAPINE FUMARATE 50 MG PO TABS
50.0000 mg | ORAL_TABLET | Freq: Every morning | ORAL | Status: DC
  Administered 2023-11-06 – 2023-11-07 (×2): 50 mg via ORAL
  Filled 2023-11-05 (×3): qty 1

## 2023-11-05 MED ORDER — GUAIFENESIN 100 MG/5ML PO LIQD
10.0000 mL | Freq: Four times a day (QID) | ORAL | Status: DC
  Administered 2023-11-05 – 2023-11-20 (×42): 10 mL via ORAL
  Filled 2023-11-05 (×48): qty 10

## 2023-11-05 MED ORDER — BETHANECHOL CHLORIDE 5 MG PO TABS
5.0000 mg | ORAL_TABLET | Freq: Three times a day (TID) | ORAL | Status: DC
  Administered 2023-11-05 – 2023-12-17 (×126): 5 mg via ORAL
  Filled 2023-11-05 (×128): qty 1

## 2023-11-05 MED ORDER — VITAMIN D 25 MCG (1000 UNIT) PO TABS
1000.0000 [IU] | ORAL_TABLET | Freq: Every day | ORAL | Status: AC
  Administered 2023-11-06 – 2023-11-27 (×22): 1000 [IU] via ORAL
  Filled 2023-11-05 (×22): qty 1

## 2023-11-05 MED ORDER — BOOST / RESOURCE BREEZE PO LIQD CUSTOM
1.0000 | Freq: Three times a day (TID) | ORAL | Status: DC
  Administered 2023-11-05 – 2023-11-07 (×7): 1 via ORAL

## 2023-11-05 MED ORDER — MELATONIN 3 MG PO TABS
3.0000 mg | ORAL_TABLET | Freq: Every day | ORAL | Status: DC
  Administered 2023-11-05 – 2023-12-16 (×41): 3 mg via ORAL
  Filled 2023-11-05 (×43): qty 1

## 2023-11-05 MED ORDER — OXYCODONE HCL 5 MG PO TABS
5.0000 mg | ORAL_TABLET | ORAL | Status: DC | PRN
  Administered 2023-11-05 – 2023-11-06 (×3): 10 mg via ORAL
  Administered 2023-11-07: 5 mg via ORAL
  Administered 2023-11-08: 10 mg via ORAL
  Administered 2023-11-08 – 2023-11-09 (×2): 5 mg via ORAL
  Administered 2023-11-09: 10 mg via ORAL
  Administered 2023-11-09: 5 mg via ORAL
  Administered 2023-11-09 – 2023-11-13 (×16): 10 mg via ORAL
  Administered 2023-11-13: 5 mg via ORAL
  Administered 2023-11-14 (×3): 10 mg via ORAL
  Administered 2023-11-15: 5 mg via ORAL
  Administered 2023-11-15 (×2): 10 mg via ORAL
  Administered 2023-11-15: 5 mg via ORAL
  Administered 2023-11-16 – 2023-11-23 (×22): 10 mg via ORAL
  Administered 2023-11-23: 5 mg via ORAL
  Administered 2023-11-23 (×2): 10 mg via ORAL
  Administered 2023-11-24: 5 mg via ORAL
  Administered 2023-11-24 – 2023-11-25 (×3): 10 mg via ORAL
  Administered 2023-11-26: 5 mg via ORAL
  Administered 2023-11-26 – 2023-12-08 (×22): 10 mg via ORAL
  Administered 2023-12-08 – 2023-12-11 (×4): 5 mg via ORAL
  Administered 2023-12-14 – 2023-12-16 (×2): 10 mg via ORAL
  Filled 2023-11-05 (×7): qty 2
  Filled 2023-11-05: qty 1
  Filled 2023-11-05 (×4): qty 2
  Filled 2023-11-05: qty 1
  Filled 2023-11-05 (×9): qty 2
  Filled 2023-11-05: qty 1
  Filled 2023-11-05 (×5): qty 2
  Filled 2023-11-05: qty 1
  Filled 2023-11-05 (×2): qty 2
  Filled 2023-11-05: qty 1
  Filled 2023-11-05 (×5): qty 2
  Filled 2023-11-05: qty 1
  Filled 2023-11-05 (×16): qty 2
  Filled 2023-11-05 (×2): qty 1
  Filled 2023-11-05 (×13): qty 2
  Filled 2023-11-05: qty 1
  Filled 2023-11-05: qty 2
  Filled 2023-11-05: qty 1
  Filled 2023-11-05 (×19): qty 2
  Filled 2023-11-05: qty 1

## 2023-11-05 MED ORDER — FOOD THICKENER (SIMPLYTHICK): 1.0000 | ORAL | Status: DC | PRN

## 2023-11-05 MED ORDER — ADULT MULTIVITAMIN W/MINERALS CH
1.0000 | ORAL_TABLET | Freq: Every day | ORAL | Status: DC
  Administered 2023-11-06 – 2023-12-17 (×42): 1 via ORAL
  Filled 2023-11-05 (×42): qty 1

## 2023-11-05 MED ORDER — DOCUSATE SODIUM 50 MG/5ML PO LIQD
100.0000 mg | Freq: Two times a day (BID) | ORAL | Status: DC
  Administered 2023-11-05 – 2023-12-02 (×50): 100 mg via ORAL
  Filled 2023-11-05 (×53): qty 10

## 2023-11-05 MED ORDER — THIAMINE HCL 100 MG/ML IJ SOLN
100.0000 mg | Freq: Every day | INTRAMUSCULAR | Status: DC
  Filled 2023-11-05 (×9): qty 2

## 2023-11-05 MED ORDER — FOLIC ACID 1 MG PO TABS
1.0000 mg | ORAL_TABLET | Freq: Every day | ORAL | Status: DC
  Administered 2023-11-06 – 2023-12-17 (×42): 1 mg via ORAL
  Filled 2023-11-05 (×43): qty 1

## 2023-11-05 MED ORDER — METHOCARBAMOL 500 MG PO TABS
1000.0000 mg | ORAL_TABLET | Freq: Three times a day (TID) | ORAL | Status: DC
  Administered 2023-11-05 – 2023-11-13 (×24): 1000 mg via ORAL
  Filled 2023-11-05 (×24): qty 2

## 2023-11-05 MED ORDER — THIAMINE MONONITRATE 100 MG PO TABS
100.0000 mg | ORAL_TABLET | Freq: Every day | ORAL | Status: DC
  Administered 2023-11-06 – 2023-12-17 (×42): 100 mg via ORAL
  Filled 2023-11-05 (×43): qty 1

## 2023-11-05 MED ORDER — ACETAMINOPHEN 500 MG PO TABS
1000.0000 mg | ORAL_TABLET | Freq: Four times a day (QID) | ORAL | Status: DC
  Administered 2023-11-05 – 2023-12-17 (×140): 1000 mg via ORAL
  Filled 2023-11-05 (×152): qty 2

## 2023-11-05 MED ORDER — IBUPROFEN 100 MG/5ML PO SUSP: 600.0000 mg | Freq: Four times a day (QID) | ORAL | Status: DC | PRN

## 2023-11-05 MED ORDER — DEXTROSE-SODIUM CHLORIDE 5-0.9 % IV SOLN: INTRAVENOUS | Status: AC

## 2023-11-05 MED ORDER — QUETIAPINE FUMARATE 100 MG PO TABS
100.0000 mg | ORAL_TABLET | Freq: Every day | ORAL | Status: DC
  Administered 2023-11-05 – 2023-11-06 (×2): 100 mg via ORAL
  Filled 2023-11-05 (×2): qty 1

## 2023-11-05 MED ORDER — CLONAZEPAM 0.5 MG PO TABS
0.5000 mg | ORAL_TABLET | Freq: Two times a day (BID) | ORAL | Status: DC
  Administered 2023-11-05 – 2023-11-23 (×37): 0.5 mg via ORAL
  Filled 2023-11-05 (×38): qty 1

## 2023-11-05 NOTE — Discharge Instructions (Addendum)
Right heel ulcer: apply mepilex dressing and prevalon boot. Change dressing at least every other day. Dry dressing over midline wound, change daily  WBAT LLE, TDWB RLE   If you would like to refer someone for a mental health assessment, outpatient therapy or psychiatric medication management, or you are interested in these services for yourself, a referral form is NOT required.   Please have the patient or guardian call us at [phone] between 8am - 3pm Monday - Friday to complete initial registration and assessment virtuall through our open access process. The patient can also walk-in to our nearest behavioral health outpatient office if preferred. If walking in, note that our offices are closed for lunch from 12-1pm. Please share the following with the patient when instructing them to call/walk-in to [facility]  Have ID and insurance card available Must be physically located in [place] when receiving virtual services If the patient has a legal guardian (parent or court-appointed), the guardian must be present to sign consents to treat
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
## 2023-11-05 NOTE — Progress Notes (Signed)
19 Days Post-Op  Subjective: CC Patient reports since ngt and cortrak were removed he has not had any n/v. He think n/v were 2/2 cortrak and ngt. He denies any abdominal pain. Passing flatus. Last BM 12/7. Foley in place w/ good UOP at 1.66ml/kg/hr  Afebrile. No tachycardia or hypotension. WBC wnl on yesterdays labs. Hgb stable at 11 on yesterdays labs. Cr wnl on today's labs.    Objective: Vital signs in last 24 hours: Temp:  [97.6 F (36.4 C)-98.8 F (37.1 C)] 98.1 F (36.7 C) (12/09 0815) Pulse Rate:  [92-99] 96 (12/09 0815) Resp:  [14-18] 14 (12/09 0815) BP: (105-127)/(60-80) 122/75 (12/09 0815) SpO2:  [93 %-98 %] 96 % (12/09 0815) Last BM Date : 11/03/23  Intake/Output from previous day: 12/08 0701 - 12/09 0700 In: -  Out: 1700 [Urine:1700] Intake/Output this shift: Total I/O In: -  Out: 305 [Urine:305]  PE: Gen:  Alert, NAD  HEENT: Pupils equal and round. EOMI Heart: RRR Lungs:  Respiratory effort nonlabored on RA.CTA b/l Abd: Very mild distension today - improved from last week. NT. +BS. Midline wound is stable with fibrin at the base and some separation of his fascial sutures as noted in picture below.  MSK:  RLE in traction. No LE edema.    Lab Results:  Recent Labs    11/03/23 0338 11/04/23 0451  WBC 11.1* 9.2  HGB 11.4* 11.0*  HCT 36.7* 35.3*  PLT 437* 385   BMET Recent Labs    11/04/23 0451 11/05/23 0416  NA 134* 134*  K 4.4 4.1  CL 100 101  CO2 26 25  GLUCOSE 95 104*  BUN 25* 17  CREATININE 0.77 0.84  CALCIUM 8.9 8.7*   PT/INR No results for input(s): "LABPROT", "INR" in the last 72 hours. CMP     Component Value Date/Time   NA 134 (L) 11/05/2023 0416   K 4.1 11/05/2023 0416   CL 101 11/05/2023 0416   CO2 25 11/05/2023 0416   GLUCOSE 104 (H) 11/05/2023 0416   BUN 17 11/05/2023 0416   CREATININE 0.84 11/05/2023 0416   CALCIUM 8.7 (L) 11/05/2023 0416   PROT 7.1 10/25/2023 0520   ALBUMIN 2.4 (L) 10/25/2023 0520   AST 47 (H)  10/25/2023 0520   ALT 45 (H) 10/25/2023 0520   ALKPHOS 127 (H) 10/25/2023 0520   BILITOT 2.5 (H) 10/25/2023 0520   GFRNONAA >60 11/05/2023 0416   Lipase  No results found for: "LIPASE"  Studies/Results: DG Abd Portable 1V  Result Date: 11/04/2023 CLINICAL DATA:  Nasogastric tube placement. EXAM: PORTABLE ABDOMEN - 1 VIEW COMPARISON:  Yesterday FINDINGS: 9:37 a.m. Nasogastric tube has been retracted or replaced, terminating over the upper chest. Normal heart size. Aortic stent graft. Nonobstructive bowel-gas pattern. No gross free intraperitoneal air. IMPRESSION: Nasogastric tube terminating over the upper chest. Per technologist notes, the physician is at bedside and removed the tube immediately after radiograph was performed. Electronically Signed   By: Jeronimo Greaves M.D.   On: 11/04/2023 10:06   DG Abd 1 View  Result Date: 11/03/2023 CLINICAL DATA:  Nasogastric tube placement. EXAM: ABDOMEN - 1 VIEW COMPARISON:  Radiographs 11/03/2023 and 11/01/2023. FINDINGS: 1155 hours. Interval removal of the feeding tube. A new enteric tube has been placed, tip projecting over the left upper quadrant, likely in the proximal stomach. The visualized bowel gas pattern is nonobstructive. Thoracic aortic stent graft and patchy bilateral airspace opacities in both lungs are grossly stable. IMPRESSION: New enteric tube tip  projects over the proximal stomach. Electronically Signed   By: Carey Bullocks M.D.   On: 11/03/2023 12:05    Anti-infectives: Anti-infectives (From admission, onward)    Start     Dose/Rate Route Frequency Ordered Stop   10/24/23 1800  ceFEPIme (MAXIPIME) 2 g in sodium chloride 0.9 % 100 mL IVPB        2 g 200 mL/hr over 30 Minutes Intravenous Every 12 hours 10/24/23 1108 10/26/23 1813   10/21/23 1400  ceFEPIme (MAXIPIME) 2 g in sodium chloride 0.9 % 100 mL IVPB  Status:  Discontinued        2 g 200 mL/hr over 30 Minutes Intravenous Every 8 hours 10/21/23 0844 10/24/23 1108   10/20/23  1900  ceFEPIme (MAXIPIME) 2 g in sodium chloride 0.9 % 100 mL IVPB  Status:  Discontinued        2 g 200 mL/hr over 30 Minutes Intravenous Every 12 hours 10/20/23 0912 10/21/23 0844   10/19/23 2300  ceFEPIme (MAXIPIME) 2 g in sodium chloride 0.9 % 100 mL IVPB  Status:  Discontinued        2 g 200 mL/hr over 30 Minutes Intravenous Every 8 hours 10/19/23 2157 10/20/23 0912   10/17/23 2200  ceFAZolin (ANCEF) IVPB 2g/100 mL premix        2 g 200 mL/hr over 30 Minutes Intravenous Every 8 hours 10/17/23 1541 10/18/23 1417   10/12/23 1745  ceFEPIme (MAXIPIME) 2 g in sodium chloride 0.9 % 100 mL IVPB  Status:  Discontinued        2 g 200 mL/hr over 30 Minutes Intravenous Every 8 hours 10/12/23 1319 10/14/23 1037   10/11/23 0945  ceFEPIme (MAXIPIME) 2 g in sodium chloride 0.9 % 100 mL IVPB  Status:  Discontinued        2 g 200 mL/hr over 30 Minutes Intravenous Every 12 hours 10/11/23 0846 10/12/23 1319        Assessment/Plan PHBC   S/P ex lap, pelvic packing (5 laps), and temporary abdominal closure with vicryl mesh by Dr. Dossie Der 11/12 - return to OR 11/13 for ex lap, removal of packs and Abthera placement by Dr. Janee Morn - extensive mesenteric contusions but bowel viable, S/P ex lap and closure 11/18 Dr. Bedelia Person. BID WTD midline. Passed for D2 diet, but has continued to have N/V.  Improved today. Will trial cld again. If tolerates this will slowly advance diet and see how he does with po intake. If any further n/v will replace PP cortrak for TF's and obtain gastric emptying study. If all workup is negative, may need to consider CT but this we can hold off at this time - afebrile, wbc wnl on last check, hgb overall stable on last check, no peritonitis on exam.  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 Vascular planning CTA this week  R acetabular FX, B superior and inferior rami FXs, R iliac FX, L sacral FX - Dr. Hulda Humphrey consulted, R femur in skeletal traction, S/P SI screw and L acetab  perc fixation 11/20 by Dr. Jena Gauss. RLE traction as definitive management for a total of 4 weeks (~12/18) Pelvic hemorrhage - packed as above, S/P IR embolization by Dr. Elby Showers 11/12 Acute hypoxic respiratory failure - TRALI, extubated 11/24, guaifenesin for secretions. IS and flutter valve. NT suction as needed. Duoneb prn.  Right gluteal hematoma Right psoas hematoma Right 11th rib fracture Multiple lumbar vertebral body transverse process fractures Right scalp hematoma ABL anemia - Hgb stable.  Tachycardia - Improved  ID - Completed 7 day course for enterobacter and kleb pna. None currently. FEN - Passed for a D2 diet - start with CLD today. Cortrak out, NGT out.  Cont IVF till tolerating po better.  Reglan (see conversation 12/6) Agitation - Off precedex. Seroquel and Klonopin weaned 12/2 -> Seroquel 50 mg am, 100 mg at bedtime; Klonopin 0.5mg  BID. PRN Haldol. On Trazodone, Cymbalta, and Hydroxyzine per psych. Psych recommends therapy/psychiatry referral upon  VTE - SCDs, LMWH Foley/urinary retention - Placed 12/6 for retention. Urecholine, UA pending Dispo - 4NP. Ortho following.   I reviewed nursing notes, Consultant Vascular notes, last 24 h vitals and pain scores, last 48 h intake and output, last 24 h labs and trends, and last 24 h imaging results.   LOS: 28 days    Jacinto Halim , [facility] 11/05/2023, 9:53 AM Please see Amion for pager number during day hours 7:00am-4:30pm

## 2023-11-05 NOTE — Progress Notes (Signed)
  Progress Note    11/05/2023 7:57 AM 19 Days Post-Op  Subjective:  no complaints   Vitals:   11/05/23 0300 11/05/23 0305  BP:  110/77  Pulse:  92  Resp:  17  Temp:  98 F (36.7 C)  SpO2: 93% 97%   Physical Exam: Lungs:  non labored Incisions:  R groin healed Extremities:  palpable DP pulses Neurologic: A&O  CBC    Component Value Date/Time   WBC 9.2 11/04/2023 0451   RBC 4.13 (L) 11/04/2023 0451   HGB 11.0 (L) 11/04/2023 0451   HCT 35.3 (L) 11/04/2023 0451   PLT 385 11/04/2023 0451   MCV 85.5 11/04/2023 0451   MCH 26.6 11/04/2023 0451   MCHC 31.2 11/04/2023 0451   RDW 16.9 (H) 11/04/2023 0451   LYMPHSABS 0.9 10/15/2023 1848   MONOABS 1.6 (H) 10/15/2023 1848   EOSABS 0.0 10/15/2023 1848   BASOSABS 0.1 10/15/2023 1848    BMET    Component Value Date/Time   NA 134 (L) 11/05/2023 0416   K 4.1 11/05/2023 0416   CL 101 11/05/2023 0416   CO2 25 11/05/2023 0416   GLUCOSE 104 (H) 11/05/2023 0416   BUN 17 11/05/2023 0416   CREATININE 0.84 11/05/2023 0416   CALCIUM 8.7 (L) 11/05/2023 0416   GFRNONAA >60 11/05/2023 0416    INR    Component Value Date/Time   INR 1.5 (H) 10/09/2023 0312     Intake/Output Summary (Last 24 hours) at 11/05/2023 0757 Last data filed at 11/05/2023 0356 Gross per 24 hour  Intake --  Output 1700 ml  Net -1700 ml     Assessment/Plan:  62 y.o. male is s/p TEVAR 19 Days Post-Op   Doing well from a vascular standpoint.  Palpable and symmetrical radial and DP pulses.  R groin incision healed CTA chest, abd, pelvis this week    Emilie Rutter, PA-C [facility] [phone] 11/05/2023 7:57 AM
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
## 2023-11-06 DIAGNOSIS — Z9889 Other specified postprocedural states: Secondary | ICD-10-CM

## 2023-11-06 DIAGNOSIS — F411 Generalized anxiety disorder: Secondary | ICD-10-CM

## 2023-11-06 LAB — GLUCOSE, CAPILLARY
Glucose-Capillary: 105 mg/dL — ABNORMAL HIGH (ref 70–99)
Glucose-Capillary: 113 mg/dL — ABNORMAL HIGH (ref 70–99)
Glucose-Capillary: 137 mg/dL — ABNORMAL HIGH (ref 70–99)
Glucose-Capillary: 162 mg/dL — ABNORMAL HIGH (ref 70–99)
Glucose-Capillary: 72 mg/dL (ref 70–99)

## 2023-11-06 MED ORDER — TAMSULOSIN HCL 0.4 MG PO CAPS
0.4000 mg | ORAL_CAPSULE | Freq: Every day | ORAL | Status: DC
  Administered 2023-11-06 – 2023-12-17 (×42): 0.4 mg via ORAL
  Filled 2023-11-06 (×42): qty 1

## 2023-11-06 MED ORDER — HYDROMORPHONE HCL 1 MG/ML IJ SOLN
0.5000 mg | INTRAMUSCULAR | Status: DC | PRN
  Administered 2023-11-06 – 2023-11-07 (×4): 0.5 mg via INTRAVENOUS
  Filled 2023-11-06 (×4): qty 0.5
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
## 2023-11-06 NOTE — Progress Notes (Addendum)
Nutrition Brief Note  Pt tolerated cld, nectar thick well and was advanced to Dysphagia 3 diet today. Last BM 11/03/2023. Making good urine.   INTERVENTION:    - Replace Vitamin D for deficiency 1000 IU daily x 30 days   - Continue Dysphagia 3, thin liquids diet with assist and promote good PO intake   - Provide Ensure Enlive BID each supplement provides 250 kcal and 9 grams of protein  - Provide Magic cup TID with meals, each supplement provides 290 kcal and 9 grams of protein   - Provide MVI with minerals daily    NUTRITION DIAGNOSIS:    Moderate Malnutrition (in the context of acute illness) related to  (inadequate energy intake) as evidenced by mild fat depletion, moderate fat depletion.   - Ongoing but being addressed with diet advancement and oral nutritional supplements    GOAL:    Patient will meet greater than or equal to 90% of their needs   If nutrition issues arise, please consult RD.   Elliot Dally, RD Registered Dietitian  See Amion for more information

## 2023-11-06 NOTE — Progress Notes (Addendum)
20 Days Post-Op  Subjective: CC Tolerating nectar thick cld without n/v or abdominal pain. Passing flatus. No BM. Last BM 12/7. Does not feel bloated. Foley in place w/ good UOP at 1.41ml/kg/hr  Objective: Vital signs in last 24 hours: Temp:  [97.6 F (36.4 C)-98.6 F (37 C)] 97.7 F (36.5 C) (12/10 0817) Pulse Rate:  [89-100] 89 (12/10 0817) Resp:  [14-19] 19 (12/10 0817) BP: (113-126)/(75-83) 121/77 (12/10 0817) SpO2:  [99 %-100 %] 100 % (12/10 0817) Last BM Date : 11/03/23  Intake/Output from previous day: 12/09 0701 - 12/10 0700 In: 873.9 [P.O.:360; I.V.:513.9] Out: 1740 [Urine:1740] Intake/Output this shift: No intake/output data recorded.  PE: Gen:  Alert, NAD  Heart: RRR Lungs:  Respiratory effort nonlabored on RA.CTA b/l Abd: Stable mild distension today. Appropriately ttp around his incision - otherwise NT. +BS. Midline wound is stable with fibrin at the base and some separation of his fascial sutures as noted in picture on 12/9.  MSK:  RLE in traction - DP 2+, SILT. No LE edema.  Lab Results:  Recent Labs    11/04/23 0451  WBC 9.2  HGB 11.0*  HCT 35.3*  PLT 385   BMET Recent Labs    11/04/23 0451 11/05/23 0416  NA 134* 134*  K 4.4 4.1  CL 100 101  CO2 26 25  GLUCOSE 95 104*  BUN 25* 17  CREATININE 0.77 0.84  CALCIUM 8.9 8.7*   PT/INR No results for input(s): "LABPROT", "INR" in the last 72 hours. CMP     Component Value Date/Time   NA 134 (L) 11/05/2023 0416   K 4.1 11/05/2023 0416   CL 101 11/05/2023 0416   CO2 25 11/05/2023 0416   GLUCOSE 104 (H) 11/05/2023 0416   BUN 17 11/05/2023 0416   CREATININE 0.84 11/05/2023 0416   CALCIUM 8.7 (L) 11/05/2023 0416   PROT 7.1 10/25/2023 0520   ALBUMIN 2.4 (L) 10/25/2023 0520   AST 47 (H) 10/25/2023 0520   ALT 45 (H) 10/25/2023 0520   ALKPHOS 127 (H) 10/25/2023 0520   BILITOT 2.5 (H) 10/25/2023 0520   GFRNONAA >60 11/05/2023 0416   Lipase  No results found for:  "LIPASE"  Studies/Results: No results found.  Anti-infectives: Anti-infectives (From admission, onward)    Start     Dose/Rate Route Frequency Ordered Stop   10/24/23 1800  ceFEPIme (MAXIPIME) 2 g in sodium chloride 0.9 % 100 mL IVPB        2 g 200 mL/hr over 30 Minutes Intravenous Every 12 hours 10/24/23 1108 10/26/23 1813   10/21/23 1400  ceFEPIme (MAXIPIME) 2 g in sodium chloride 0.9 % 100 mL IVPB  Status:  Discontinued        2 g 200 mL/hr over 30 Minutes Intravenous Every 8 hours 10/21/23 0844 10/24/23 1108   10/20/23 1900  ceFEPIme (MAXIPIME) 2 g in sodium chloride 0.9 % 100 mL IVPB  Status:  Discontinued        2 g 200 mL/hr over 30 Minutes Intravenous Every 12 hours 10/20/23 0912 10/21/23 0844   10/19/23 2300  ceFEPIme (MAXIPIME) 2 g in sodium chloride 0.9 % 100 mL IVPB  Status:  Discontinued        2 g 200 mL/hr over 30 Minutes Intravenous Every 8 hours 10/19/23 2157 10/20/23 0912   10/17/23 2200  ceFAZolin (ANCEF) IVPB 2g/100 mL premix        2 g 200 mL/hr over 30 Minutes Intravenous Every 8 hours 10/17/23  1541 10/18/23 1417   10/12/23 1745  ceFEPIme (MAXIPIME) 2 g in sodium chloride 0.9 % 100 mL IVPB  Status:  Discontinued        2 g 200 mL/hr over 30 Minutes Intravenous Every 8 hours 10/12/23 1319 10/14/23 1037   10/11/23 0945  ceFEPIme (MAXIPIME) 2 g in sodium chloride 0.9 % 100 mL IVPB  Status:  Discontinued        2 g 200 mL/hr over 30 Minutes Intravenous Every 12 hours 10/11/23 0846 10/12/23 1319        Assessment/Plan PHBC   S/P ex lap, pelvic packing (5 laps), and temporary abdominal closure with vicryl mesh by Dr. Dossie Der 11/12 - return to OR 11/13 for ex lap, removal of packs and Abthera placement by Dr. Janee Morn - extensive mesenteric contusions but bowel viable, S/P ex lap and closure 11/18 Dr. Bedelia Person. BID WTD midline. Adv to D2 diet today.  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 Vascular has ordered a f/u CTA  R acetabular FX, B superior  and inferior rami FXs, R iliac FX, L sacral FX - Dr. Hulda Humphrey consulted, R femur in skeletal traction, S/P SI screw and L acetab perc fixation 11/20 by Dr. Jena Gauss. RLE traction as definitive management for a total of 4 weeks (~12/18) Pelvic hemorrhage - packed as above, S/P IR embolization by Dr. Elby Showers 11/12 Acute hypoxic respiratory failure - TRALI, extubated 11/24, guaifenesin for secretions. IS and flutter valve. NT suction as needed. Duoneb prn.  Right gluteal hematoma Right psoas hematoma Right 11th rib fracture Multiple lumbar vertebral body transverse process fractures Right scalp hematoma ABL anemia - Hgb stable on last check.  Tachycardia - Improved  ID - Completed 7 day course for enterobacter and kleb pna. None currently. FEN - D2 diet - if tolerates, can be advanced further by SLP. SLIV. Reglan x 5d (see conversation 12/6) Agitation -  Off precedex. Seroquel and Klonopin weaned 12/2 -> Seroquel 50 mg am, 100 mg at bedtime; Klonopin 0.5mg  BID. PRN Haldol. On Trazodone, Cymbalta, and Hydroxyzine per psych. Psych recommends therapy/psychiatry referral upon  VTE - SCDs, LMWH Foley/urinary retention - Placed 12/6 for retention. Urecholine, UA neg. Consider TOV soon.  Dispo - 4NP. Ortho following.   I reviewed nursing notes, Consultant Vascular notes, last 24 h vitals and pain scores, last 48 h intake and output, last 24 h labs and trends, and last 24 h imaging results.   LOS: 29 days    Jacinto Halim , [facility] 11/06/2023, 10:30 AM Please see Amion for pager number during day hours 7:00am-4:30pm

## 2023-11-07 ENCOUNTER — Inpatient Hospital Stay (HOSPITAL_COMMUNITY): Payer: MEDICAID

## 2023-11-07 LAB — BASIC METABOLIC PANEL
Anion gap: 7 (ref 5–15)
BUN: 12 mg/dL (ref 8–23)
CO2: 24 mmol/L (ref 22–32)
Calcium: 8.9 mg/dL (ref 8.9–10.3)
Chloride: 100 mmol/L (ref 98–111)
Creatinine, Ser: 0.8 mg/dL (ref 0.61–1.24)
GFR, Estimated: >60 mL/min (ref >60)
Glucose, Bld: 106 mg/dL — ABNORMAL HIGH (ref 70–99)
Potassium: 4.2 mmol/L (ref 3.5–5.1)
Sodium: 131 mmol/L — ABNORMAL LOW (ref 135–145)

## 2023-11-07 LAB — GLUCOSE, CAPILLARY
Glucose-Capillary: 84 mg/dL (ref 70–99)
Glucose-Capillary: 83 mg/dL (ref 70–99)
Glucose-Capillary: 112 mg/dL — ABNORMAL HIGH (ref 70–99)
Glucose-Capillary: 114 mg/dL — ABNORMAL HIGH (ref 70–99)
Glucose-Capillary: 91 mg/dL (ref 70–99)
Glucose-Capillary: 95 mg/dL (ref 70–99)

## 2023-11-07 LAB — CBC
HCT: 35.2 % — ABNORMAL LOW (ref 39.0–52.0)
Hemoglobin: 11 g/dL — ABNORMAL LOW (ref 13.0–17.0)
MCH: 26.7 pg (ref 26.0–34.0)
MCHC: 31.3 g/dL (ref 30.0–36.0)
MCV: 85.4 fL (ref 80.0–100.0)
Platelets: 325 10*3/uL (ref 150–400)
RBC: 4.12 MIL/uL — ABNORMAL LOW (ref 4.22–5.81)
RDW: 16.4 % — ABNORMAL HIGH (ref 11.5–15.5)
WBC: 6.1 10*3/uL (ref 4.0–10.5)
nRBC: 0 % (ref 0.0–0.2)

## 2023-11-07 MED ORDER — IOHEXOL 350 MG/ML SOLN
75.0000 mL | Freq: Once | INTRAVENOUS | Status: AC | PRN
  Administered 2023-11-07: 75 mL via INTRAVENOUS

## 2023-11-07 MED ORDER — POLYETHYLENE GLYCOL 3350 17 G PO PACK: 17.0000 g | PACK | Freq: Every day | ORAL | Status: DC

## 2023-11-07 MED ORDER — SUCRALFATE 1 GM/10ML PO SUSP
1.0000 g | Freq: Three times a day (TID) | ORAL | Status: DC
  Administered 2023-11-07 – 2023-12-03 (×102): 1 g via ORAL
  Filled 2023-11-07 (×106): qty 10

## 2023-11-07 MED ORDER — POLYETHYLENE GLYCOL 3350 17 G PO PACK
17.0000 g | PACK | Freq: Two times a day (BID) | ORAL | Status: DC
  Administered 2023-11-07 – 2023-12-16 (×54): 17 g via ORAL
  Filled 2023-11-07 (×74): qty 1

## 2023-11-07 MED ORDER — HYDROMORPHONE HCL 1 MG/ML IJ SOLN
INTRAMUSCULAR | Status: AC
  Filled 2023-11-07: qty 1

## 2023-11-07 MED ORDER — HYDROMORPHONE HCL 1 MG/ML IJ SOLN
0.5000 mg | INTRAMUSCULAR | Status: AC | PRN
  Administered 2023-11-07 (×2): 1 mg via INTRAVENOUS
  Administered 2023-11-08: 0.5 mg via INTRAVENOUS
  Administered 2023-11-10: 1 mg via INTRAVENOUS
  Filled 2023-11-07 (×3): qty 1

## 2023-11-07 MED ORDER — QUETIAPINE FUMARATE 50 MG PO TABS
50.0000 mg | ORAL_TABLET | Freq: Every day | ORAL | Status: DC
Start: 2023-11-07 — End: 2023-12-17
  Administered 2023-11-07 – 2023-12-16 (×40): 50 mg via ORAL
  Filled 2023-11-07 (×40): qty 1

## 2023-11-07 NOTE — Plan of Care (Signed)
  Problem: Nutrition: Goal: Adequate nutrition will be maintained Outcome: Progressing   
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
## 2023-11-08 DIAGNOSIS — Z9889 Other specified postprocedural states: Secondary | ICD-10-CM | POA: Diagnosis not present

## 2023-11-08 LAB — BASIC METABOLIC PANEL
Anion gap: 6 (ref 5–15)
BUN: 13 mg/dL (ref 8–23)
CO2: 24 mmol/L (ref 22–32)
Calcium: 8.9 mg/dL (ref 8.9–10.3)
Chloride: 101 mmol/L (ref 98–111)
Creatinine, Ser: 0.87 mg/dL (ref 0.61–1.24)
GFR, Estimated: >60 mL/min (ref >60)
Glucose, Bld: 97 mg/dL (ref 70–99)
Potassium: 3.8 mmol/L (ref 3.5–5.1)
Sodium: 131 mmol/L — ABNORMAL LOW (ref 135–145)

## 2023-11-08 LAB — GLUCOSE, CAPILLARY
Glucose-Capillary: 98 mg/dL (ref 70–99)
Glucose-Capillary: 94 mg/dL (ref 70–99)

## 2023-11-08 MED ORDER — HYDROXYZINE HCL 25 MG PO TABS
25.0000 mg | ORAL_TABLET | Freq: Two times a day (BID) | ORAL | Status: DC | PRN
  Filled 2023-11-08: qty 1

## 2023-11-08 MED ORDER — HYDROXYZINE HCL 25 MG PO TABS
25.0000 mg | ORAL_TABLET | Freq: Three times a day (TID) | ORAL | Status: DC
  Administered 2023-11-08 – 2023-11-09 (×4): 25 mg via ORAL
  Filled 2023-11-08 (×4): qty 1

## 2023-11-08 MED ORDER — ASPIRIN 81 MG PO TBEC
81.0000 mg | DELAYED_RELEASE_TABLET | Freq: Every day | ORAL | Status: DC
  Administered 2023-11-08 – 2023-12-17 (×40): 81 mg via ORAL
  Filled 2023-11-08 (×40): qty 1

## 2023-11-08 MED ORDER — DULOXETINE HCL 60 MG PO CPEP
60.0000 mg | ORAL_CAPSULE | Freq: Every day | ORAL | Status: DC
  Administered 2023-11-09 – 2023-12-17 (×39): 60 mg via ORAL
  Filled 2023-11-08 (×39): qty 1

## 2023-11-08 MED ORDER — PROSOURCE PLUS PO LIQD
30.0000 mL | Freq: Two times a day (BID) | ORAL | Status: DC
  Administered 2023-11-08 – 2023-12-17 (×68): 30 mL via ORAL
  Filled 2023-11-08 (×68): qty 30

## 2023-11-08 MED ORDER — DULOXETINE HCL 30 MG PO CPEP
30.0000 mg | ORAL_CAPSULE | Freq: Once | ORAL | Status: AC
  Administered 2023-11-08: 30 mg via ORAL
  Filled 2023-11-08: qty 1
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
## 2023-11-08 NOTE — Progress Notes (Signed)
Patient ID: [name] [name], male   DOB: Feb 04, 1961, 62 y.o.   MRN: [id number] 22 Days Post-Op    Subjective: Reports he ate this AM, not having a lot of pain ROS negative except as listed above. Objective: Vital signs in last 24 hours: Temp:  [97.8 F (36.6 C)-98.3 F (36.8 C)] 98.3 F (36.8 C) (12/12 0800) Pulse Rate:  [89-100] 99 (12/12 0800) Resp:  [14-28] 28 (12/12 0855) BP: (121-132)/(81-93) 130/84 (12/12 0800) SpO2:  [95 %-99 %] 95 % (12/12 0800) Last BM Date : 11/07/23  Intake/Output from previous day: 12/11 0701 - 12/12 0700 In: 720 [P.O.:720] Out: 1400 [Urine:1400] Intake/Output this shift: Total I/O In: -  Out: 225 [Urine:225]  General appearance: alert and cooperative Resp: clear to auscultation bilaterally GI: soft, NT, wound with some mild fascial separation and fibrin at the base  Extremities: traction off RLE, R hip hematoma  Lab Results: CBC  Recent Labs    11/07/23 0845  WBC 6.1  HGB 11.0*  HCT 35.2*  PLT 325   BMET Recent Labs    11/07/23 0845 11/08/23 0431  NA 131* 131*  K 4.2 3.8  CL 100 101  CO2 24 24  GLUCOSE 106* 97  BUN 12 13  CREATININE 0.80 0.87  CALCIUM 8.9 8.9   PT/INR No results for input(s): "LABPROT", "INR" in the last 72 hours. ABG No results for input(s): "PHART", "HCO3" in the last 72 hours.  Invalid input(s): "PCO2", "PO2"  Studies/Results: DG Pelvis Comp Min 3V Result Date: 11/07/2023 CLINICAL DATA:  Follow-up pelvic fracture EXAM: JUDET PELVIS - 3+ VIEW COMPARISON:  10/31/2023 FINDINGS: Frontal, pelvic inlet, pelvic outlet, and bilateral Judet views of the bony pelvis are obtained. Cannulated screws are again seen traversing the bilateral sacroiliac joints and left superior pubic ramus, in stable position. Comminuted bilateral superior and inferior pubic rami fractures, comminuted right acetabular fracture, and right iliac bone fracture are again noted and unchanged since the previous study. No new bony abnormalities.  Diffuse subcutaneous edema. IMPRESSION: 1. Stable orthopedic hardware across the bilateral sacroiliac joints and left superior pubic ramus. 2. Similar appearance of the bilateral superior and inferior pubic rami fractures, comminuted rest tabular fracture, and right iliac fractures. Electronically Signed   By: Sharlet Salina M.D.   On: 11/07/2023 19:00   CT ANGIO CHEST AORTA W/CM & OR WO/CM Addendum Date: 11/07/2023 ADDENDUM REPORT: 11/07/2023 15:17 ADDENDUM: A previously noted left L1 transverse process fracture is also currently included. Electronically Signed   By: Beckie Salts M.D.   On: 11/07/2023 15:17   Result Date: 11/07/2023 CLINICAL DATA:  New onset right hip pain and swelling following trauma. Interaction for complex pelvic fractures. Status post thoracic aortic endovascular repair. EXAM: CT ANGIOGRAPHY CHEST CT PELVIS WITH CONTRAST TECHNIQUE: Multidetector CT imaging of the chest was performed using the standard protocol during bolus administration of intravenous contrast. Multiplanar CT image reconstructions and MIPs were obtained to evaluate the vascular anatomy. Multidetector CT imaging of the pelvis was performed using the standard protocol during bolus administration of intravenous contrast. RADIATION DOSE REDUCTION: This exam was performed according to the departmental dose-optimization program which includes automated exposure control, adjustment of the mA and/or kV according to patient size and/or use of iterative reconstruction technique. CONTRAST:  75mL OMNIPAQUE IOHEXOL 350 MG/ML SOLN COMPARISON:  Pelvis CT dated 10/17/2023. Chest, abdomen and pelvis CT dated 10/08/2023. FINDINGS: CTA CHEST FINDINGS Cardiovascular: Interval endovascular stent extending from the mid aortic arch through the proximal descending thoracic aorta, bridging  the previously demonstrated aortic isthmus transsection. Normal opacification of the stented portion of the aorta with no contrast extravasation. Resolved  mediastinal hematoma. Normally opacified pulmonary arteries with no pulmonary arterial filling defects seen. Normal sized heart. Atheromatous calcifications, including the coronary arteries and aorta. No pericardial effusion. Mediastinum/Nodes: Resolved periaortic hemorrhage. Interval mildly enlarged mediastinal nodes, including a right paratracheal node with a short axis diameter of 11 mm on image number 32/5 and a right pretracheal node with a short axis diameter of 12 mm on image number 24/5. Stable tracheal deviation to the right. Interval mild-to-moderate proximal esophageal wall thickening. Unremarkable included thyroid gland. Lungs/Pleura: Stable chronic bilateral lung scarring in bilateral upper lobe bronchiectasis and bronchiolectasis. Stable bilateral paraseptal bullous changes and centrilobular bullous changes. No pneumothorax or pleural fluid. Musculoskeletal: Mildly progressive displacement of the previously demonstrated right posteromedial 11th rib fracture. Minimal thoracic spine degenerative changes and scoliosis. Review of the MIP images confirms the above findings. CT PELVIS FINDINGS Urinary Tract: Foley catheter in the urinary bladder with no significant urine in the bladder. No visible ureteral abnormality. Bowel: Mild diffuse mucosal thickening and enhancement involving the included right colon and appendix with no periappendiceal soft tissue stranding, enlargement or fluid collections. Unremarkable small bowel loops. Gaseous distention of the included portion of the sigmoid colon and rectum with some stool in both. Vascular/Lymphatic: Mild atheromatous arterial calcifications. No contrast extravasation. No enlarged lymph nodes. Reproductive: Prostate is unremarkable. Other: Small amount of residual peritoneal fluid anterior and lateral to the urinary bladder on the right. Musculoskeletal: Previously described markedly comminuted pelvic fractures with fixation screws. There is some interval  callus formation forming pubic and acetabular regions bilaterally. Interval low density of previously demonstrated areas of hemorrhage in the right buttock region and on both sides of the pelvic fractures on the right with mild peripheral rim enhancement and one area of mild peripheral rim calcification. IMPRESSION: 1. Interval endovascular stent extending from the mid aortic arch through the proximal descending thoracic aorta, bridging the previously demonstrated aortic isthmus transsection. No contrast extravasation. 2. Resolved mediastinal hematoma and periaortic hemorrhage. 3. Interval mildly enlarged mediastinal nodes, most likely reactive. 4. Interval mild-to-moderate proximal esophageal wall thickening, most likely due to esophagitis. 5. Stable chronic bilateral lung scarring, bilateral upper lobe bronchiectasis and bronchiolectasis and bilateral paraseptal and centrilobular bullous emphysematous changes. 6. Mildly progressive displacement of the previously demonstrated right posteromedial 11th rib fracture. 7. Mild diffuse mucosal thickening and enhancement involving the included right colon and appendix, consistent with primary or reactive colitis involving the appendix. 8. Gaseous distention of the included portion of the sigmoid colon and rectum with some stool in both. 9. Interval low density of previously demonstrated areas of hemorrhage in the right buttock region and on both sides of the pelvic fractures on the right with mild peripheral rim enhancement and one area of mild peripheral rim calcification. These are most compatible with evolving hematomas. Any of these could potentially represent an abscess. 10. Small amount of residual peritoneal fluid anterior and lateral to the urinary bladder on the right. 11.  Calcific coronary artery and aortic atherosclerosis. Aortic Atherosclerosis (ICD10-I70.0) and Emphysema (ICD10-J43.9). Electronically Signed: By: Beckie Salts M.D. On: 11/07/2023 15:06   CT  PELVIS W CONTRAST Addendum Date: 11/07/2023 ADDENDUM REPORT: 11/07/2023 15:17 ADDENDUM: A previously noted left L1 transverse process fracture is also currently included. Electronically Signed   By: Beckie Salts M.D.   On: 11/07/2023 15:17   Result Date: 11/07/2023 CLINICAL DATA:  New onset right  hip pain and swelling following trauma. Interaction for complex pelvic fractures. Status post thoracic aortic endovascular repair. EXAM: CT ANGIOGRAPHY CHEST CT PELVIS WITH CONTRAST TECHNIQUE: Multidetector CT imaging of the chest was performed using the standard protocol during bolus administration of intravenous contrast. Multiplanar CT image reconstructions and MIPs were obtained to evaluate the vascular anatomy. Multidetector CT imaging of the pelvis was performed using the standard protocol during bolus administration of intravenous contrast. RADIATION DOSE REDUCTION: This exam was performed according to the departmental dose-optimization program which includes automated exposure control, adjustment of the mA and/or kV according to patient size and/or use of iterative reconstruction technique. CONTRAST:  75mL OMNIPAQUE IOHEXOL 350 MG/ML SOLN COMPARISON:  Pelvis CT dated 10/17/2023. Chest, abdomen and pelvis CT dated 10/08/2023. FINDINGS: CTA CHEST FINDINGS Cardiovascular: Interval endovascular stent extending from the mid aortic arch through the proximal descending thoracic aorta, bridging the previously demonstrated aortic isthmus transsection. Normal opacification of the stented portion of the aorta with no contrast extravasation. Resolved mediastinal hematoma. Normally opacified pulmonary arteries with no pulmonary arterial filling defects seen. Normal sized heart. Atheromatous calcifications, including the coronary arteries and aorta. No pericardial effusion. Mediastinum/Nodes: Resolved periaortic hemorrhage. Interval mildly enlarged mediastinal nodes, including a right paratracheal node with a short axis  diameter of 11 mm on image number 32/5 and a right pretracheal node with a short axis diameter of 12 mm on image number 24/5. Stable tracheal deviation to the right. Interval mild-to-moderate proximal esophageal wall thickening. Unremarkable included thyroid gland. Lungs/Pleura: Stable chronic bilateral lung scarring in bilateral upper lobe bronchiectasis and bronchiolectasis. Stable bilateral paraseptal bullous changes and centrilobular bullous changes. No pneumothorax or pleural fluid. Musculoskeletal: Mildly progressive displacement of the previously demonstrated right posteromedial 11th rib fracture. Minimal thoracic spine degenerative changes and scoliosis. Review of the MIP images confirms the above findings. CT PELVIS FINDINGS Urinary Tract: Foley catheter in the urinary bladder with no significant urine in the bladder. No visible ureteral abnormality. Bowel: Mild diffuse mucosal thickening and enhancement involving the included right colon and appendix with no periappendiceal soft tissue stranding, enlargement or fluid collections. Unremarkable small bowel loops. Gaseous distention of the included portion of the sigmoid colon and rectum with some stool in both. Vascular/Lymphatic: Mild atheromatous arterial calcifications. No contrast extravasation. No enlarged lymph nodes. Reproductive: Prostate is unremarkable. Other: Small amount of residual peritoneal fluid anterior and lateral to the urinary bladder on the right. Musculoskeletal: Previously described markedly comminuted pelvic fractures with fixation screws. There is some interval callus formation forming pubic and acetabular regions bilaterally. Interval low density of previously demonstrated areas of hemorrhage in the right buttock region and on both sides of the pelvic fractures on the right with mild peripheral rim enhancement and one area of mild peripheral rim calcification. IMPRESSION: 1. Interval endovascular stent extending from the mid aortic  arch through the proximal descending thoracic aorta, bridging the previously demonstrated aortic isthmus transsection. No contrast extravasation. 2. Resolved mediastinal hematoma and periaortic hemorrhage. 3. Interval mildly enlarged mediastinal nodes, most likely reactive. 4. Interval mild-to-moderate proximal esophageal wall thickening, most likely due to esophagitis. 5. Stable chronic bilateral lung scarring, bilateral upper lobe bronchiectasis and bronchiolectasis and bilateral paraseptal and centrilobular bullous emphysematous changes. 6. Mildly progressive displacement of the previously demonstrated right posteromedial 11th rib fracture. 7. Mild diffuse mucosal thickening and enhancement involving the included right colon and appendix, consistent with primary or reactive colitis involving the appendix. 8. Gaseous distention of the included portion of the sigmoid colon and rectum with  some stool in both. 9. Interval low density of previously demonstrated areas of hemorrhage in the right buttock region and on both sides of the pelvic fractures on the right with mild peripheral rim enhancement and one area of mild peripheral rim calcification. These are most compatible with evolving hematomas. Any of these could potentially represent an abscess. 10. Small amount of residual peritoneal fluid anterior and lateral to the urinary bladder on the right. 11.  Calcific coronary artery and aortic atherosclerosis. Aortic Atherosclerosis (ICD10-I70.0) and Emphysema (ICD10-J43.9). Electronically Signed: By: Beckie Salts M.D. On: 11/07/2023 15:06    Anti-infectives: Anti-infectives (From admission, onward)    Start     Dose/Rate Route Frequency Ordered Stop   10/24/23 1800  ceFEPIme (MAXIPIME) 2 g in sodium chloride 0.9 % 100 mL IVPB        2 g 200 mL/hr over 30 Minutes Intravenous Every 12 hours 10/24/23 1108 10/26/23 1813   10/21/23 1400  ceFEPIme (MAXIPIME) 2 g in sodium chloride 0.9 % 100 mL IVPB  Status:   Discontinued        2 g 200 mL/hr over 30 Minutes Intravenous Every 8 hours 10/21/23 0844 10/24/23 1108   10/20/23 1900  ceFEPIme (MAXIPIME) 2 g in sodium chloride 0.9 % 100 mL IVPB  Status:  Discontinued        2 g 200 mL/hr over 30 Minutes Intravenous Every 12 hours 10/20/23 0912 10/21/23 0844   10/19/23 2300  ceFEPIme (MAXIPIME) 2 g in sodium chloride 0.9 % 100 mL IVPB  Status:  Discontinued        2 g 200 mL/hr over 30 Minutes Intravenous Every 8 hours 10/19/23 2157 10/20/23 0912   10/17/23 2200  ceFAZolin (ANCEF) IVPB 2g/100 mL premix        2 g 200 mL/hr over 30 Minutes Intravenous Every 8 hours 10/17/23 1541 10/18/23 1417   10/12/23 1745  ceFEPIme (MAXIPIME) 2 g in sodium chloride 0.9 % 100 mL IVPB  Status:  Discontinued        2 g 200 mL/hr over 30 Minutes Intravenous Every 8 hours 10/12/23 1319 10/14/23 1037   10/11/23 0945  ceFEPIme (MAXIPIME) 2 g in sodium chloride 0.9 % 100 mL IVPB  Status:  Discontinued        2 g 200 mL/hr over 30 Minutes Intravenous Every 12 hours 10/11/23 0846 10/12/23 1319       Assessment/Plan: PHBC   S/P ex lap, pelvic packing (5 laps), and temporary abdominal closure with vicryl mesh by Dr. Dossie Der 11/12 - return to OR 11/13 for ex lap, removal of packs and Abthera placement by Dr. Janee Morn - extensive mesenteric contusions but bowel viable, S/P ex lap and closure 11/18 Dr. Bedelia Person. BID WTD midline. Adv to D3 diet 12/10 and doing great.  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 Vascular has ordered a f/u CTA  R acetabular FX, B superior and inferior rami FXs, R iliac FX, L sacral FX - Dr. Hulda Humphrey consulted, R femur in skeletal traction, S/P SI screw and L acetab perc fixation 11/20 by Dr. Jena Gauss. RLE traction as definitive management for a total of 4 weeks (~12/18).  CT 12/11 showed R hip hematoma. RLE Traction off and WB updated per Dr. Jena Gauss TDWB LLE. Continue NWB RLE  Pelvic hemorrhage - packed as above, S/P IR embolization by Dr. Elby Showers  11/12 Acute hypoxic respiratory failure - TRALI, extubated 11/24, guaifenesin for secretions. IS and flutter valve. NT suction as needed. Duoneb prn.  Right gluteal hematoma Right psoas hematoma Right 11th rib fracture Multiple lumbar vertebral body transverse process fractures Right scalp hematoma ABL anemia - Hgb stable on last check.  Tachycardia - Improved  ID - Completed 7 day course for enterobacter and kleb pna. None currently. FEN - D3 diet with thin liquids Agitation -  Klonopin 0.5mg  BID for anxiety. Seroquel at HS, PRN Haldol. On Trazodone, Cymbalta, and Hydroxyzine per psych. Psych recommends therapy/psychiatry referral upon DC VTE - SCDs, LMWH Foley/urinary retention - Placed 12/6 for retention. Urecholine, UA neg. Flomax, DC foley 12/11 Dispo - 4NP. Therapies. WB updated by Ortho Trauma team  LOS: 31 days    Violeta Gelinas, MD, MPH, FACS Trauma & General Surgery Use [email or web address] to contact on call provider  11/08/2023
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
## 2023-11-08 NOTE — Progress Notes (Signed)
  Inpatient Rehab Admissions Coordinator :  Per therapy recommendations, patient was screened for CIR candidacy by Ottie Glazier RN MSN.  At this time patient appears to be a potential candidate for CIR. I will place a rehab consult per protocol for full assessment.  Patient will have to have a solid discharge disposition as [facility] limited with SNF dispositions. Please call me with any questions.  Ottie Glazier RN MSN Admissions Coordinator [phone]
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
## 2023-11-08 NOTE — Progress Notes (Addendum)
  Progress Note    11/08/2023 7:38 AM 22 Days Post-Op  Subjective:  no complaints   Vitals:   11/08/23 0332 11/08/23 0334  BP: (!) 127/93   Pulse: 100   Resp: 18 18  Temp: 98.1 F (36.7 C)   SpO2: 98%    Physical Exam: Lungs:  non labored Incisions:  R groin healed Extremities:  palpable DP pulses Neurologic: A&O  CBC    Component Value Date/Time   WBC 6.1 11/07/2023 0845   RBC 4.12 (L) 11/07/2023 0845   HGB 11.0 (L) 11/07/2023 0845   HCT 35.2 (L) 11/07/2023 0845   PLT 325 11/07/2023 0845   MCV 85.4 11/07/2023 0845   MCH 26.7 11/07/2023 0845   MCHC 31.3 11/07/2023 0845   RDW 16.4 (H) 11/07/2023 0845   LYMPHSABS 0.9 10/15/2023 1848   MONOABS 1.6 (H) 10/15/2023 1848   EOSABS 0.0 10/15/2023 1848   BASOSABS 0.1 10/15/2023 1848    BMET    Component Value Date/Time   NA 131 (L) 11/08/2023 0431   K 3.8 11/08/2023 0431   CL 101 11/08/2023 0431   CO2 24 11/08/2023 0431   GLUCOSE 97 11/08/2023 0431   BUN 13 11/08/2023 0431   CREATININE 0.87 11/08/2023 0431   CALCIUM 8.9 11/08/2023 0431   GFRNONAA >60 11/08/2023 0431    INR    Component Value Date/Time   INR 1.5 (H) 10/09/2023 0312     Intake/Output Summary (Last 24 hours) at 11/08/2023 0738 Last data filed at 11/08/2023 0410 Gross per 24 hour  Intake 720 ml  Output 1400 ml  Net -680 ml     Assessment/Plan:  62 y.o. male is s/p TEVAR 22 Days Post-Op   Doing well from vascular standpoint.  CTA repeated yesterday demonstrating successful repair of aortic transection with resolution of peri-aortic hemorrhage.  R groin access site without hematoma.  Continue aspirin.  Vascular will sign off.  We will repeat CTA chest, a, p in 1 year    Emilie Rutter, [facility] [phone] 11/08/2023 7:38 AM   VASCULAR STAFF ADDENDUM: I have independently interviewed and examined the patient. I agree with the above.  I had a nice discussion with Junaid this morning.  I am amazed he is  doing so well. CTA chest reviewed-happy with aortic repair. Will see in 1 year with repeat CTA chest abdomen pelvis.  Victorino Sparrow MD [facility] Phone Number: [phone] 11/08/2023 8:11 AM
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
## 2023-11-09 ENCOUNTER — Inpatient Hospital Stay (HOSPITAL_COMMUNITY): Payer: MEDICAID

## 2023-11-09 DIAGNOSIS — Z9889 Other specified postprocedural states: Secondary | ICD-10-CM | POA: Diagnosis not present

## 2023-11-09 MED ORDER — METOCLOPRAMIDE HCL 5 MG/5ML PO SOLN
5.0000 mg | Freq: Four times a day (QID) | ORAL | Status: AC
  Administered 2023-11-09 – 2023-11-14 (×19): 5 mg via ORAL
  Filled 2023-11-09 (×21): qty 10

## 2023-11-09 MED ORDER — HYDROXYZINE HCL 25 MG PO TABS
25.0000 mg | ORAL_TABLET | Freq: Two times a day (BID) | ORAL | Status: DC | PRN
  Administered 2023-11-10 – 2023-12-01 (×4): 25 mg via ORAL
  Filled 2023-11-09 (×4): qty 1

## 2023-11-09 NOTE — Progress Notes (Signed)
Patient ID: [name] [name], male   DOB: [birth year], 62 y.o.   MRN: [id number] 23 Days Post-Op    Subjective: Says he's eating fairly well.  Had a small BM this morning.  Right hip still bothering him.  Objective: Vital signs in last 24 hours: Temp:  [97.5 F (36.4 C)-98.1 F (36.7 C)] 97.6 F (36.4 C) (12/13 0710) Pulse Rate:  [90-100] 97 (12/13 0710) Resp:  [15-28] 18 (12/13 0710) BP: (110-123)/(71-86) 120/74 (12/13 0710) SpO2:  [97 %-100 %] 100 % (12/13 0710) Last BM Date : 11/07/23  Intake/Output from previous day: 12/12 0701 - 12/13 0700 In: 1560 [P.O.:1560] Out: 2000 [Urine:2000] Intake/Output this shift: No intake/output data recorded.  General appearance: alert and cooperative Resp: clear to auscultation bilaterally GI: soft, NT, wound with some mild fascial separation and fibrin at the base  Extremities: traction off RLE, R hip hematoma  stable.  Lab Results: CBC  Recent Labs    11/07/23 0845  WBC 6.1  HGB 11.0*  HCT 35.2*  PLT 325   BMET Recent Labs    11/07/23 0845 11/08/23 0431  NA 131* 131*  K 4.2 3.8  CL 100 101  CO2 24 24  GLUCOSE 106* 97  BUN 12 13  CREATININE 0.80 0.87  CALCIUM 8.9 8.9   PT/INR No results for input(s): "LABPROT", "INR" in the last 72 hours. ABG No results for input(s): "PHART", "HCO3" in the last 72 hours.  Invalid input(s): "PCO2", "PO2"  Studies/Results: DG Pelvis Comp Min 3V Result Date: 11/07/2023 CLINICAL DATA:  Follow-up pelvic fracture EXAM: JUDET PELVIS - 3+ VIEW COMPARISON:  10/31/2023 FINDINGS: Frontal, pelvic inlet, pelvic outlet, and bilateral Judet views of the bony pelvis are obtained. Cannulated screws are again seen traversing the bilateral sacroiliac joints and left superior pubic ramus, in stable position. Comminuted bilateral superior and inferior pubic rami fractures, comminuted right acetabular fracture, and right iliac bone fracture are again noted and unchanged since the previous study. No new bony  abnormalities. Diffuse subcutaneous edema. IMPRESSION: 1. Stable orthopedic hardware across the bilateral sacroiliac joints and left superior pubic ramus. 2. Similar appearance of the bilateral superior and inferior pubic rami fractures, comminuted rest tabular fracture, and right iliac fractures. Electronically Signed   By: Sharlet Salina M.D.   On: 11/07/2023 19:00   CT ANGIO CHEST AORTA W/CM & OR WO/CM Addendum Date: 11/07/2023 ADDENDUM REPORT: 11/07/2023 15:17 ADDENDUM: A previously noted left L1 transverse process fracture is also currently included. Electronically Signed   By: Beckie Salts M.D.   On: 11/07/2023 15:17   Result Date: 11/07/2023 CLINICAL DATA:  New onset right hip pain and swelling following trauma. Interaction for complex pelvic fractures. Status post thoracic aortic endovascular repair. EXAM: CT ANGIOGRAPHY CHEST CT PELVIS WITH CONTRAST TECHNIQUE: Multidetector CT imaging of the chest was performed using the standard protocol during bolus administration of intravenous contrast. Multiplanar CT image reconstructions and MIPs were obtained to evaluate the vascular anatomy. Multidetector CT imaging of the pelvis was performed using the standard protocol during bolus administration of intravenous contrast. RADIATION DOSE REDUCTION: This exam was performed according to the departmental dose-optimization program which includes automated exposure control, adjustment of the mA and/or kV according to patient size and/or use of iterative reconstruction technique. CONTRAST:  75mL OMNIPAQUE IOHEXOL 350 MG/ML SOLN COMPARISON:  Pelvis CT dated 10/17/2023. Chest, abdomen and pelvis CT dated 10/08/2023. FINDINGS: CTA CHEST FINDINGS Cardiovascular: Interval endovascular stent extending from the mid aortic arch through the proximal descending thoracic aorta, bridging  the previously demonstrated aortic isthmus transsection. Normal opacification of the stented portion of the aorta with no contrast  extravasation. Resolved mediastinal hematoma. Normally opacified pulmonary arteries with no pulmonary arterial filling defects seen. Normal sized heart. Atheromatous calcifications, including the coronary arteries and aorta. No pericardial effusion. Mediastinum/Nodes: Resolved periaortic hemorrhage. Interval mildly enlarged mediastinal nodes, including a right paratracheal node with a short axis diameter of 11 mm on image number 32/5 and a right pretracheal node with a short axis diameter of 12 mm on image number 24/5. Stable tracheal deviation to the right. Interval mild-to-moderate proximal esophageal wall thickening. Unremarkable included thyroid gland. Lungs/Pleura: Stable chronic bilateral lung scarring in bilateral upper lobe bronchiectasis and bronchiolectasis. Stable bilateral paraseptal bullous changes and centrilobular bullous changes. No pneumothorax or pleural fluid. Musculoskeletal: Mildly progressive displacement of the previously demonstrated right posteromedial 11th rib fracture. Minimal thoracic spine degenerative changes and scoliosis. Review of the MIP images confirms the above findings. CT PELVIS FINDINGS Urinary Tract: Foley catheter in the urinary bladder with no significant urine in the bladder. No visible ureteral abnormality. Bowel: Mild diffuse mucosal thickening and enhancement involving the included right colon and appendix with no periappendiceal soft tissue stranding, enlargement or fluid collections. Unremarkable small bowel loops. Gaseous distention of the included portion of the sigmoid colon and rectum with some stool in both. Vascular/Lymphatic: Mild atheromatous arterial calcifications. No contrast extravasation. No enlarged lymph nodes. Reproductive: Prostate is unremarkable. Other: Small amount of residual peritoneal fluid anterior and lateral to the urinary bladder on the right. Musculoskeletal: Previously described markedly comminuted pelvic fractures with fixation screws.  There is some interval callus formation forming pubic and acetabular regions bilaterally. Interval low density of previously demonstrated areas of hemorrhage in the right buttock region and on both sides of the pelvic fractures on the right with mild peripheral rim enhancement and one area of mild peripheral rim calcification. IMPRESSION: 1. Interval endovascular stent extending from the mid aortic arch through the proximal descending thoracic aorta, bridging the previously demonstrated aortic isthmus transsection. No contrast extravasation. 2. Resolved mediastinal hematoma and periaortic hemorrhage. 3. Interval mildly enlarged mediastinal nodes, most likely reactive. 4. Interval mild-to-moderate proximal esophageal wall thickening, most likely due to esophagitis. 5. Stable chronic bilateral lung scarring, bilateral upper lobe bronchiectasis and bronchiolectasis and bilateral paraseptal and centrilobular bullous emphysematous changes. 6. Mildly progressive displacement of the previously demonstrated right posteromedial 11th rib fracture. 7. Mild diffuse mucosal thickening and enhancement involving the included right colon and appendix, consistent with primary or reactive colitis involving the appendix. 8. Gaseous distention of the included portion of the sigmoid colon and rectum with some stool in both. 9. Interval low density of previously demonstrated areas of hemorrhage in the right buttock region and on both sides of the pelvic fractures on the right with mild peripheral rim enhancement and one area of mild peripheral rim calcification. These are most compatible with evolving hematomas. Any of these could potentially represent an abscess. 10. Small amount of residual peritoneal fluid anterior and lateral to the urinary bladder on the right. 11.  Calcific coronary artery and aortic atherosclerosis. Aortic Atherosclerosis (ICD10-I70.0) and Emphysema (ICD10-J43.9). Electronically Signed: By: Beckie Salts M.D. On:  11/07/2023 15:06   CT PELVIS W CONTRAST Addendum Date: 11/07/2023 ADDENDUM REPORT: 11/07/2023 15:17 ADDENDUM: A previously noted left L1 transverse process fracture is also currently included. Electronically Signed   By: Beckie Salts M.D.   On: 11/07/2023 15:17   Result Date: 11/07/2023 CLINICAL DATA:  New onset right  hip pain and swelling following trauma. Interaction for complex pelvic fractures. Status post thoracic aortic endovascular repair. EXAM: CT ANGIOGRAPHY CHEST CT PELVIS WITH CONTRAST TECHNIQUE: Multidetector CT imaging of the chest was performed using the standard protocol during bolus administration of intravenous contrast. Multiplanar CT image reconstructions and MIPs were obtained to evaluate the vascular anatomy. Multidetector CT imaging of the pelvis was performed using the standard protocol during bolus administration of intravenous contrast. RADIATION DOSE REDUCTION: This exam was performed according to the departmental dose-optimization program which includes automated exposure control, adjustment of the mA and/or kV according to patient size and/or use of iterative reconstruction technique. CONTRAST:  75mL OMNIPAQUE IOHEXOL 350 MG/ML SOLN COMPARISON:  Pelvis CT dated 10/17/2023. Chest, abdomen and pelvis CT dated 10/08/2023. FINDINGS: CTA CHEST FINDINGS Cardiovascular: Interval endovascular stent extending from the mid aortic arch through the proximal descending thoracic aorta, bridging the previously demonstrated aortic isthmus transsection. Normal opacification of the stented portion of the aorta with no contrast extravasation. Resolved mediastinal hematoma. Normally opacified pulmonary arteries with no pulmonary arterial filling defects seen. Normal sized heart. Atheromatous calcifications, including the coronary arteries and aorta. No pericardial effusion. Mediastinum/Nodes: Resolved periaortic hemorrhage. Interval mildly enlarged mediastinal nodes, including a right paratracheal  node with a short axis diameter of 11 mm on image number 32/5 and a right pretracheal node with a short axis diameter of 12 mm on image number 24/5. Stable tracheal deviation to the right. Interval mild-to-moderate proximal esophageal wall thickening. Unremarkable included thyroid gland. Lungs/Pleura: Stable chronic bilateral lung scarring in bilateral upper lobe bronchiectasis and bronchiolectasis. Stable bilateral paraseptal bullous changes and centrilobular bullous changes. No pneumothorax or pleural fluid. Musculoskeletal: Mildly progressive displacement of the previously demonstrated right posteromedial 11th rib fracture. Minimal thoracic spine degenerative changes and scoliosis. Review of the MIP images confirms the above findings. CT PELVIS FINDINGS Urinary Tract: Foley catheter in the urinary bladder with no significant urine in the bladder. No visible ureteral abnormality. Bowel: Mild diffuse mucosal thickening and enhancement involving the included right colon and appendix with no periappendiceal soft tissue stranding, enlargement or fluid collections. Unremarkable small bowel loops. Gaseous distention of the included portion of the sigmoid colon and rectum with some stool in both. Vascular/Lymphatic: Mild atheromatous arterial calcifications. No contrast extravasation. No enlarged lymph nodes. Reproductive: Prostate is unremarkable. Other: Small amount of residual peritoneal fluid anterior and lateral to the urinary bladder on the right. Musculoskeletal: Previously described markedly comminuted pelvic fractures with fixation screws. There is some interval callus formation forming pubic and acetabular regions bilaterally. Interval low density of previously demonstrated areas of hemorrhage in the right buttock region and on both sides of the pelvic fractures on the right with mild peripheral rim enhancement and one area of mild peripheral rim calcification. IMPRESSION: 1. Interval endovascular stent  extending from the mid aortic arch through the proximal descending thoracic aorta, bridging the previously demonstrated aortic isthmus transsection. No contrast extravasation. 2. Resolved mediastinal hematoma and periaortic hemorrhage. 3. Interval mildly enlarged mediastinal nodes, most likely reactive. 4. Interval mild-to-moderate proximal esophageal wall thickening, most likely due to esophagitis. 5. Stable chronic bilateral lung scarring, bilateral upper lobe bronchiectasis and bronchiolectasis and bilateral paraseptal and centrilobular bullous emphysematous changes. 6. Mildly progressive displacement of the previously demonstrated right posteromedial 11th rib fracture. 7. Mild diffuse mucosal thickening and enhancement involving the included right colon and appendix, consistent with primary or reactive colitis involving the appendix. 8. Gaseous distention of the included portion of the sigmoid colon and rectum with  some stool in both. 9. Interval low density of previously demonstrated areas of hemorrhage in the right buttock region and on both sides of the pelvic fractures on the right with mild peripheral rim enhancement and one area of mild peripheral rim calcification. These are most compatible with evolving hematomas. Any of these could potentially represent an abscess. 10. Small amount of residual peritoneal fluid anterior and lateral to the urinary bladder on the right. 11.  Calcific coronary artery and aortic atherosclerosis. Aortic Atherosclerosis (ICD10-I70.0) and Emphysema (ICD10-J43.9). Electronically Signed: By: Beckie Salts M.D. On: 11/07/2023 15:06    Anti-infectives: Anti-infectives (From admission, onward)    Start     Dose/Rate Route Frequency Ordered Stop   10/24/23 1800  ceFEPIme (MAXIPIME) 2 g in sodium chloride 0.9 % 100 mL IVPB        2 g 200 mL/hr over 30 Minutes Intravenous Every 12 hours 10/24/23 1108 10/26/23 1813   10/21/23 1400  ceFEPIme (MAXIPIME) 2 g in sodium chloride 0.9  % 100 mL IVPB  Status:  Discontinued        2 g 200 mL/hr over 30 Minutes Intravenous Every 8 hours 10/21/23 0844 10/24/23 1108   10/20/23 1900  ceFEPIme (MAXIPIME) 2 g in sodium chloride 0.9 % 100 mL IVPB  Status:  Discontinued        2 g 200 mL/hr over 30 Minutes Intravenous Every 12 hours 10/20/23 0912 10/21/23 0844   10/19/23 2300  ceFEPIme (MAXIPIME) 2 g in sodium chloride 0.9 % 100 mL IVPB  Status:  Discontinued        2 g 200 mL/hr over 30 Minutes Intravenous Every 8 hours 10/19/23 2157 10/20/23 0912   10/17/23 2200  ceFAZolin (ANCEF) IVPB 2g/100 mL premix        2 g 200 mL/hr over 30 Minutes Intravenous Every 8 hours 10/17/23 1541 10/18/23 1417   10/12/23 1745  ceFEPIme (MAXIPIME) 2 g in sodium chloride 0.9 % 100 mL IVPB  Status:  Discontinued        2 g 200 mL/hr over 30 Minutes Intravenous Every 8 hours 10/12/23 1319 10/14/23 1037   10/11/23 0945  ceFEPIme (MAXIPIME) 2 g in sodium chloride 0.9 % 100 mL IVPB  Status:  Discontinued        2 g 200 mL/hr over 30 Minutes Intravenous Every 12 hours 10/11/23 0846 10/12/23 1319       Assessment/Plan: PHBC   S/P ex lap, pelvic packing (5 laps), and temporary abdominal closure with vicryl mesh by Dr. Dossie Der 11/12 - return to OR 11/13 for ex lap, removal of packs and Abthera placement by Dr. Janee Morn - extensive mesenteric contusions but bowel viable, S/P ex lap and closure 11/18 Dr. Bedelia Person. BID WTD midline. Adv to D3 diet 12/10 and doing great.  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 Vascular has ordered a f/u CTA  R acetabular FX, B superior and inferior rami FXs, R iliac FX, L sacral FX - Dr. Hulda Humphrey consulted, R femur in skeletal traction, S/P SI screw and L acetab perc fixation 11/20 by Dr. Jena Gauss. RLE traction as definitive management for a total of 4 weeks (~12/18).  CT 12/11 showed R hip hematoma. RLE Traction off and WB updated per Dr. Jena Gauss TDWB LLE. Continue NWB RLE  Pelvic hemorrhage - packed as above, S/P IR  embolization by Dr. Elby Showers 11/12 Acute hypoxic respiratory failure - TRALI, extubated 11/24, guaifenesin for secretions. IS and flutter valve. NT suction as needed. Duoneb prn.  Right gluteal hematoma Right psoas hematoma Right 11th rib fracture Multiple lumbar vertebral body transverse process fractures Right scalp hematoma ABL anemia - Hgb stable on last check.  Tachycardia - Improved  ID - Completed 7 day course for enterobacter and kleb pna. None currently. FEN - D3 diet with thin liquids Agitation -  Klonopin 0.5mg  BID for anxiety. Seroquel at HS, PRN Haldol. On Trazodone, Cymbalta, and Hydroxyzine per psych. Psych recommends therapy/psychiatry referral upon DC VTE - SCDs, LMWH Foley/urinary retention - Placed 12/6 for retention. Urecholine, UA neg. Flomax, DC foley 12/11 Dispo - 4NP. Therapies. CIR pending  LOS: 32 days    Letha Cape, PA-C Trauma [facility] Surgery Use [email or web address] to contact on call provider  11/09/2023
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
## 2023-11-09 NOTE — Progress Notes (Signed)
Called to patients room for vomiting Patient reports he was eating applesauce, became nauseated and vomited.  He reports current nausea and soreness of his abdomen from vomiting but otherwise no abdominal pain.  Previously was tolerating d3 without nv. Passing flatus. Small bm this am.  Last vitals without fever, tachycardia or hypotension.  Abdomen is soft, mildly distended, NT, +BS. Wound stable from description this am.  Will obtain xray. Told patient to start slow w/ clears and gradually advance back dys3 diet if he tolerates. If he has any abdominal pain, distension, or further n/v I have asked RN to notify our team.  Discussed w/ attending - will restart short course of Reglan x 5d. Discussed w/ pt risk of Tardive Dyskinesia/EPS w/ Reglan - he is agreeable to take Reglan.   [name] Halim, PA-C
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
## 2023-11-10 NOTE — Progress Notes (Signed)
Patient ID: [name] [name], male   DOB: 05-23-1961, 62 y.o.   MRN: [id number] 24 Days Post-Op    Subjective: Says he's eating fairly well.  Having flatus and BM.   Objective: Vital signs in last 24 hours: Temp:  [97.5 F (36.4 C)-98.9 F (37.2 C)] 98.3 F (36.8 C) (12/14 0806) Pulse Rate:  [91-104] 104 (12/14 0806) Resp:  [16-20] 16 (12/14 0806) BP: (108-138)/(78-91) 138/86 (12/14 0806) SpO2:  [94 %-100 %] 100 % (12/14 0806) Last BM Date : 11/09/23  Intake/Output from previous day: 12/13 0701 - 12/14 0700 In: 720 [P.O.:720] Out: 1525 [Urine:1525] Intake/Output this shift: Total I/O In: -  Out: 250 [Urine:250]  General appearance: alert and cooperative Resp: clear to auscultation bilaterally GI: soft, NT, wound with some mild fascial separation and fibrin at the base  Extremities: traction off RLE, R hip hematoma  stable.  Lab Results: CBC  No results for input(s): "WBC", "HGB", "HCT", "PLT" in the last 72 hours.  BMET Recent Labs    11/08/23 0431  NA 131*  K 3.8  CL 101  CO2 24  GLUCOSE 97  BUN 13  CREATININE 0.87  CALCIUM 8.9   PT/INR No results for input(s): "LABPROT", "INR" in the last 72 hours. ABG No results for input(s): "PHART", "HCO3" in the last 72 hours.  Invalid input(s): "PCO2", "PO2"  Studies/Results: DG Abd Portable 1V Result Date: 11/09/2023 CLINICAL DATA:  Vomiting. EXAM: PORTABLE ABDOMEN - 1 VIEW COMPARISON:  Radiographs 11/04/2023 and 11/03/2023. FINDINGS: 1229 hours. Two supine views of the abdomen are submitted. There is a normal nonobstructive bowel gas pattern. No supine evidence of free intraperitoneal air. No suspicious abdominal calcifications. Patient is status post screw fixation across the sacroiliac joints and left superior pubic ramus. The hardware appears intact. Multiple underlying bilateral pelvic fractures are grossly unchanged. IMPRESSION: 1. No evidence of bowel obstruction or free intraperitoneal air. 2. Multiple bilateral  pelvic fractures status post screw fixation. Electronically Signed   By: Carey Bullocks M.D.   On: 11/09/2023 16:25    Anti-infectives: Anti-infectives (From admission, onward)    Start     Dose/Rate Route Frequency Ordered Stop   10/24/23 1800  ceFEPIme (MAXIPIME) 2 g in sodium chloride 0.9 % 100 mL IVPB        2 g 200 mL/hr over 30 Minutes Intravenous Every 12 hours 10/24/23 1108 10/26/23 1813   10/21/23 1400  ceFEPIme (MAXIPIME) 2 g in sodium chloride 0.9 % 100 mL IVPB  Status:  Discontinued        2 g 200 mL/hr over 30 Minutes Intravenous Every 8 hours 10/21/23 0844 10/24/23 1108   10/20/23 1900  ceFEPIme (MAXIPIME) 2 g in sodium chloride 0.9 % 100 mL IVPB  Status:  Discontinued        2 g 200 mL/hr over 30 Minutes Intravenous Every 12 hours 10/20/23 0912 10/21/23 0844   10/19/23 2300  ceFEPIme (MAXIPIME) 2 g in sodium chloride 0.9 % 100 mL IVPB  Status:  Discontinued        2 g 200 mL/hr over 30 Minutes Intravenous Every 8 hours 10/19/23 2157 10/20/23 0912   10/17/23 2200  ceFAZolin (ANCEF) IVPB 2g/100 mL premix        2 g 200 mL/hr over 30 Minutes Intravenous Every 8 hours 10/17/23 1541 10/18/23 1417   10/12/23 1745  ceFEPIme (MAXIPIME) 2 g in sodium chloride 0.9 % 100 mL IVPB  Status:  Discontinued        2  g 200 mL/hr over 30 Minutes Intravenous Every 8 hours 10/12/23 1319 10/14/23 1037   10/11/23 0945  ceFEPIme (MAXIPIME) 2 g in sodium chloride 0.9 % 100 mL IVPB  Status:  Discontinued        2 g 200 mL/hr over 30 Minutes Intravenous Every 12 hours 10/11/23 0846 10/12/23 1319       Assessment/Plan: PHBC   S/P ex lap, pelvic packing (5 laps), and temporary abdominal closure with vicryl mesh by Dr. Dossie Der 11/12 - return to OR 11/13 for ex lap, removal of packs and Abthera placement by Dr. Janee Morn - extensive mesenteric contusions but bowel viable, S/P ex lap and closure 11/18 Dr. Bedelia Person. BID WTD midline. Adv to D3 diet 12/10. N/V 12/13, abd XR reassuring; started  reglan x 5 days.  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 Vascular has ordered a f/u CTA  R acetabular FX, B superior and inferior rami FXs, R iliac FX, L sacral FX - Dr. Hulda Humphrey consulted, R femur in skeletal traction, S/P SI screw and L acetab perc fixation 11/20 by Dr. Jena Gauss. RLE traction as definitive management for a total of 4 weeks (~12/18).  CT 12/11 showed R hip hematoma. RLE Traction off and WB updated per Dr. Jena Gauss TDWB LLE. Continue NWB RLE  Pelvic hemorrhage - packed as above, S/P IR embolization by Dr. Elby Showers 11/12 Acute hypoxic respiratory failure - TRALI, extubated 11/24, guaifenesin for secretions. IS and flutter valve. NT suction as needed. Duoneb prn.  Right gluteal hematoma Right psoas hematoma Right 11th rib fracture Multiple lumbar vertebral body transverse process fractures Right scalp hematoma ABL anemia - Hgb stable on last check.  Tachycardia - Improved  ID - Completed 7 day course for enterobacter and kleb pna. None currently. FEN - D3 diet with thin liquids Agitation -  Klonopin 0.5mg  BID for anxiety. Seroquel at HS, PRN Haldol. On Trazodone, Cymbalta, and Hydroxyzine per psych. Psych recommends therapy/psychiatry referral upon DC VTE - SCDs, LMWH Foley/urinary retention - Placed 12/6 for retention. Urecholine, UA neg. Flomax, DC foley 12/11 Dispo - 4NP. Therapies. CIR pending  LOS: 33 days    Andria Meuse, MD Trauma & General Surgery Use [email or web address] to contact on call provider  11/10/2023

## 2023-11-11 LAB — CBC WITH DIFFERENTIAL/PLATELET
Abs Immature Granulocytes: 0.07 10*3/uL (ref 0.00–0.07)
Basophils Absolute: 0 10*3/uL (ref 0.0–0.1)
Basophils Relative: 0 %
Eosinophils Absolute: 0.2 10*3/uL (ref 0.0–0.5)
Eosinophils Relative: 2 %
HCT: 33.5 % — ABNORMAL LOW (ref 39.0–52.0)
Hemoglobin: 10.5 g/dL — ABNORMAL LOW (ref 13.0–17.0)
Immature Granulocytes: 1 %
Lymphocytes Relative: 22 %
Lymphs Abs: 1.6 10*3/uL (ref 0.7–4.0)
MCH: 26.5 pg (ref 26.0–34.0)
MCHC: 31.3 g/dL (ref 30.0–36.0)
MCV: 84.6 fL (ref 80.0–100.0)
Monocytes Absolute: 0.7 10*3/uL (ref 0.1–1.0)
Monocytes Relative: 10 %
Neutro Abs: 4.5 10*3/uL (ref 1.7–7.7)
Neutrophils Relative %: 65 %
Platelets: 254 10*3/uL (ref 150–400)
RBC: 3.96 MIL/uL — ABNORMAL LOW (ref 4.22–5.81)
RDW: 16.7 % — ABNORMAL HIGH (ref 11.5–15.5)
WBC: 7.1 10*3/uL (ref 4.0–10.5)
nRBC: 0 % (ref 0.0–0.2)

## 2023-11-11 LAB — BASIC METABOLIC PANEL
Anion gap: 14 (ref 5–15)
BUN: 14 mg/dL (ref 8–23)
CO2: 27 mmol/L (ref 22–32)
Calcium: 8.9 mg/dL (ref 8.9–10.3)
Chloride: 93 mmol/L — ABNORMAL LOW (ref 98–111)
Creatinine, Ser: 1.12 mg/dL (ref 0.61–1.24)
GFR, Estimated: >60 mL/min (ref >60)
Glucose, Bld: 92 mg/dL (ref 70–99)
Potassium: 4.1 mmol/L (ref 3.5–5.1)
Sodium: 134 mmol/L — ABNORMAL LOW (ref 135–145)

## 2023-11-11 NOTE — Plan of Care (Signed)
Patient passed med. Sized stool today. Voiced desire to go to in patient rehab.

## 2023-11-11 NOTE — Progress Notes (Signed)
Patient ID: [name] [name], male   DOB: October 26, 1961, 62 y.o.   MRN: [id number] 25 Days Post-Op    Subjective: Denies any complaints today. Having flatus and BM.   Objective: Vital signs in last 24 hours: Temp:  [97.7 F (36.5 C)-98.7 F (37.1 C)] 98.1 F (36.7 C) (12/15 0822) Pulse Rate:  [92-98] 98 (12/15 0822) Resp:  [13-21] 21 (12/15 0600) BP: (95-124)/(66-85) 119/85 (12/15 0822) SpO2:  [92 %-100 %] 100 % (12/15 0822) Last BM Date : 11/10/23  Intake/Output from previous day: 12/14 0701 - 12/15 0700 In: -  Out: 250 [Urine:250] Intake/Output this shift: No intake/output data recorded.  General appearance: alert and cooperative Resp: clear to auscultation bilaterally GI: soft, NT, wound with some mild fascial separation and fibrin at the base  Extremities: traction off RLE, R hip hematoma  stable.  Lab Results: CBC  Recent Labs    11/11/23 0545  WBC 7.1  HGB 10.5*  HCT 33.5*  PLT 254    BMET Recent Labs    11/11/23 0545  NA 134*  K 4.1  CL 93*  CO2 27  GLUCOSE 92  BUN 14  CREATININE 1.12  CALCIUM 8.9   PT/INR No results for input(s): "LABPROT", "INR" in the last 72 hours. ABG No results for input(s): "PHART", "HCO3" in the last 72 hours.  Invalid input(s): "PCO2", "PO2"  Studies/Results: DG Abd Portable 1V Result Date: 11/09/2023 CLINICAL DATA:  Vomiting. EXAM: PORTABLE ABDOMEN - 1 VIEW COMPARISON:  Radiographs 11/04/2023 and 11/03/2023. FINDINGS: 1229 hours. Two supine views of the abdomen are submitted. There is a normal nonobstructive bowel gas pattern. No supine evidence of free intraperitoneal air. No suspicious abdominal calcifications. Patient is status post screw fixation across the sacroiliac joints and left superior pubic ramus. The hardware appears intact. Multiple underlying bilateral pelvic fractures are grossly unchanged. IMPRESSION: 1. No evidence of bowel obstruction or free intraperitoneal air. 2. Multiple bilateral pelvic fractures status  post screw fixation. Electronically Signed   By: Carey Bullocks M.D.   On: 11/09/2023 16:25    Anti-infectives: Anti-infectives (From admission, onward)    Start     Dose/Rate Route Frequency Ordered Stop   10/24/23 1800  ceFEPIme (MAXIPIME) 2 g in sodium chloride 0.9 % 100 mL IVPB        2 g 200 mL/hr over 30 Minutes Intravenous Every 12 hours 10/24/23 1108 10/26/23 1813   10/21/23 1400  ceFEPIme (MAXIPIME) 2 g in sodium chloride 0.9 % 100 mL IVPB  Status:  Discontinued        2 g 200 mL/hr over 30 Minutes Intravenous Every 8 hours 10/21/23 0844 10/24/23 1108   10/20/23 1900  ceFEPIme (MAXIPIME) 2 g in sodium chloride 0.9 % 100 mL IVPB  Status:  Discontinued        2 g 200 mL/hr over 30 Minutes Intravenous Every 12 hours 10/20/23 0912 10/21/23 0844   10/19/23 2300  ceFEPIme (MAXIPIME) 2 g in sodium chloride 0.9 % 100 mL IVPB  Status:  Discontinued        2 g 200 mL/hr over 30 Minutes Intravenous Every 8 hours 10/19/23 2157 10/20/23 0912   10/17/23 2200  ceFAZolin (ANCEF) IVPB 2g/100 mL premix        2 g 200 mL/hr over 30 Minutes Intravenous Every 8 hours 10/17/23 1541 10/18/23 1417   10/12/23 1745  ceFEPIme (MAXIPIME) 2 g in sodium chloride 0.9 % 100 mL IVPB  Status:  Discontinued  2 g 200 mL/hr over 30 Minutes Intravenous Every 8 hours 10/12/23 1319 10/14/23 1037   10/11/23 0945  ceFEPIme (MAXIPIME) 2 g in sodium chloride 0.9 % 100 mL IVPB  Status:  Discontinued        2 g 200 mL/hr over 30 Minutes Intravenous Every 12 hours 10/11/23 0846 10/12/23 1319       Assessment/Plan: PHBC   S/P ex lap, pelvic packing (5 laps), and temporary abdominal closure with vicryl mesh by Dr. Dossie Der 11/12 - return to OR 11/13 for ex lap, removal of packs and Abthera placement by Dr. Janee Morn - extensive mesenteric contusions but bowel viable, S/P ex lap and closure 11/18 Dr. Bedelia Person. BID WTD midline. Adv to D3 diet 12/10. N/V 12/13, abd XR reassuring; started reglan x 5 days.  Aortic  transsection - Dr. 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 Vascular has ordered a f/u CTA  R acetabular FX, B superior and inferior rami FXs, R iliac FX, L sacral FX - Dr. Hulda Humphrey consulted, R femur in skeletal traction, S/P SI screw and L acetab perc fixation 11/20 by Dr. Jena Gauss. RLE traction as definitive management for a total of 4 weeks (~12/18).  CT 12/11 showed R hip hematoma. RLE Traction off and WB updated per Dr. Jena Gauss TDWB LLE. Continue NWB RLE  Pelvic hemorrhage - packed as above, S/P IR embolization by Dr. Elby Showers 11/12 Acute hypoxic respiratory failure - TRALI, extubated 11/24, guaifenesin for secretions. IS and flutter valve. NT suction as needed. Duoneb prn.  Right gluteal hematoma Right psoas hematoma Right 11th rib fracture Multiple lumbar vertebral body transverse process fractures Right scalp hematoma ABL anemia - Hgb stable on last check.  Tachycardia - Improved  ID - Completed 7 day course for enterobacter and kleb pna. None currently. FEN - D3 diet with thin liquids Agitation -  Klonopin 0.5mg  BID for anxiety. Seroquel at HS, PRN Haldol. On Trazodone, Cymbalta, and Hydroxyzine per psych. Psych recommends therapy/psychiatry referral upon DC VTE - SCDs, LMWH Foley/urinary retention - Placed 12/6 for retention. Urecholine, UA neg. Flomax, DC foley 12/11 Dispo - 4NP. Therapies. CIR pending   LOS: 34 days    Andria Meuse, MD Trauma & General Surgery Use [email or web address] to contact on call provider  11/11/2023

## 2023-11-12 NOTE — Plan of Care (Signed)
Patient continues to work with PT and was able to stand and pivot to bedside commode without issue.
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
## 2023-11-13 MED ORDER — CYCLOBENZAPRINE HCL 5 MG PO TABS
5.0000 mg | ORAL_TABLET | Freq: Three times a day (TID) | ORAL | Status: DC
  Administered 2023-11-13 – 2023-11-18 (×18): 5 mg via ORAL
  Filled 2023-11-13 (×18): qty 1
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
## 2023-11-13 NOTE — Plan of Care (Signed)
  Problem: Clinical Measurements: Goal: Will remain free from infection Outcome: Progressing   Problem: Nutrition: Goal: Adequate nutrition will be maintained Outcome: Progressing   Problem: Skin Integrity: Goal: Risk for impaired skin integrity will decrease Outcome: Progressing   Problem: Activity: Goal: Ability to ambulate and perform ADLs will improve Outcome: Progressing   Problem: Clinical Measurements: Goal: Postoperative complications will be avoided or minimized Outcome: Progressing   Problem: Self-Concept: Goal: Ability to maintain and perform role responsibilities to the fullest extent possible will improve Outcome: Progressing   Problem: Pain Management: Goal: Pain level will decrease Outcome: Progressing
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
## 2023-11-13 NOTE — Progress Notes (Addendum)
27 Days Post-Op  Subjective: CC: Doing well. Other than pain from tape at midline incision when he moves, he denies any abdominal pain. Tolerating diet and eating at least 1/2 of his trays and 2 ensures a day. No n/v. Passing flatus. Small BM yesterday. Voiding. Having R leg spasms. Working with PT. Cough improved, non-productive. No sob.   Afebrile. HR stable at 90-100. No hypotension. On RA.   He discussed with his family yesterday - he now desires SNF.  Objective: Vital signs in last 24 hours: Temp:  [97.6 F (36.4 C)-98.2 F (36.8 C)] 97.9 F (36.6 C) (12/17 0732) Pulse Rate:  [93-104] 100 (12/17 0732) Resp:  [18-21] 19 (12/17 0732) BP: (112-123)/(71-84) 112/71 (12/17 0732) SpO2:  [98 %-100 %] 98 % (12/17 0732) Last BM Date : 11/10/23  Intake/Output from previous day: 12/16 0701 - 12/17 0700 In: 480 [P.O.:480] Out: 455 [Urine:455] Intake/Output this shift: No intake/output data recorded.  PE: Gen:  Alert, NAD, pleasant Card:  Reg Pulm:  CTAB, no W/R/R, effort normal Abd: Soft, mild distension, NT, +BS, wound stable from picture from 12/16 (see below) Ext: R hip hematoma without overlying erythema/heat. No LE edema. Wiggles digits of b/l LE's. SILT to BLE"s. DP 2+ b/l.  Psych: A&Ox3    Lab Results:  Recent Labs    11/11/23 0545  WBC 7.1  HGB 10.5*  HCT 33.5*  PLT 254   BMET Recent Labs    11/11/23 0545  NA 134*  K 4.1  CL 93*  CO2 27  GLUCOSE 92  BUN 14  CREATININE 1.12  CALCIUM 8.9   PT/INR No results for input(s): "LABPROT", "INR" in the last 72 hours. CMP     Component Value Date/Time   NA 134 (L) 11/11/2023 0545   K 4.1 11/11/2023 0545   CL 93 (L) 11/11/2023 0545   CO2 27 11/11/2023 0545   GLUCOSE 92 11/11/2023 0545   BUN 14 11/11/2023 0545   CREATININE 1.12 11/11/2023 0545   CALCIUM 8.9 11/11/2023 0545   PROT 7.1 10/25/2023 0520   ALBUMIN 2.4 (L) 10/25/2023 0520   AST 47 (H) 10/25/2023 0520   ALT 45 (H) 10/25/2023 0520    ALKPHOS 127 (H) 10/25/2023 0520   BILITOT 2.5 (H) 10/25/2023 0520   GFRNONAA >60 11/11/2023 0545   Lipase  No results found for: "LIPASE"  Studies/Results: No results found.  Anti-infectives: Anti-infectives (From admission, onward)    Start     Dose/Rate Route Frequency Ordered Stop   10/24/23 1800  ceFEPIme (MAXIPIME) 2 g in sodium chloride 0.9 % 100 mL IVPB        2 g 200 mL/hr over 30 Minutes Intravenous Every 12 hours 10/24/23 1108 10/26/23 1813   10/21/23 1400  ceFEPIme (MAXIPIME) 2 g in sodium chloride 0.9 % 100 mL IVPB  Status:  Discontinued        2 g 200 mL/hr over 30 Minutes Intravenous Every 8 hours 10/21/23 0844 10/24/23 1108   10/20/23 1900  ceFEPIme (MAXIPIME) 2 g in sodium chloride 0.9 % 100 mL IVPB  Status:  Discontinued        2 g 200 mL/hr over 30 Minutes Intravenous Every 12 hours 10/20/23 0912 10/21/23 0844   10/19/23 2300  ceFEPIme (MAXIPIME) 2 g in sodium chloride 0.9 % 100 mL IVPB  Status:  Discontinued        2 g 200 mL/hr over 30 Minutes Intravenous Every 8 hours 10/19/23 2157 10/20/23 0912  10/17/23 2200  ceFAZolin (ANCEF) IVPB 2g/100 mL premix        2 g 200 mL/hr over 30 Minutes Intravenous Every 8 hours 10/17/23 1541 10/18/23 1417   10/12/23 1745  ceFEPIme (MAXIPIME) 2 g in sodium chloride 0.9 % 100 mL IVPB  Status:  Discontinued        2 g 200 mL/hr over 30 Minutes Intravenous Every 8 hours 10/12/23 1319 10/14/23 1037   10/11/23 0945  ceFEPIme (MAXIPIME) 2 g in sodium chloride 0.9 % 100 mL IVPB  Status:  Discontinued        2 g 200 mL/hr over 30 Minutes Intravenous Every 12 hours 10/11/23 0846 10/12/23 1319        Assessment/Plan PHBC   S/P ex lap, pelvic packing (5 laps), and temporary abdominal closure with vicryl mesh by Dr. Dossie Der 11/12 - return to OR 11/13 for ex lap, removal of packs and Abthera placement by Dr. Janee Morn - extensive mesenteric contusions but bowel viable, S/P ex lap and closure 11/18 Dr. Bedelia Person. BID WTD midline.  Adv to D3 diet 12/10. N/V 12/13, abd XR reassuring; started reglan x 5 days -resolved. Monitor.  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 Vascular reviewed f/u CTA. F/u 1 year.  R acetabular FX, B superior and inferior rami FXs, R iliac FX, L sacral FX - Dr. Hulda Humphrey consulted, R femur in skeletal traction, S/P SI screw and L acetab perc fixation 11/20 by Dr. Jena Gauss. RLE traction as definitive management for a total of 4 weeks (~12/18).  CT 12/11 showed R hip hematoma. RLE Traction off and WB updated per Dr. Jena Gauss WBAT LLE for transfers. Continue NWB RLE. They plan to repeat xrays on 12/18.  Pelvic hemorrhage - packed as above, S/P IR embolization by Dr. Elby Showers 11/12 Acute hypoxic respiratory failure - TRALI, extubated 11/24, guaifenesin for secretions. IS and flutter valve. NT suction as needed. Duoneb prn.  Right gluteal hematoma Right psoas hematoma Right 11th rib fracture Multiple lumbar vertebral body transverse process fractures Right scalp hematoma ABL anemia - Hgb stable on last check.  Tachycardia - Improved  ID - Completed 7 day course for enterobacter and kleb pna. None currently. FEN - D3 diet with thin liquids Agitation -  Klonopin 0.5mg  BID for anxiety. Seroquel at HS, PRN Haldol. On Trazodone, Cymbalta, and Hydroxyzine per psych. Psych recommends therapy/psychiatry referral upon DC VTE - SCDs, LMWH Foley/urinary retention - Placed 12/6 for retention. Urecholine, UA neg. Flomax, DC foley 12/11. Spont void Dispo -  Therapies. LOG SNF. Transfer to 6N  I reviewed nursing notes, last 24 h vitals and pain scores, last 48 h intake and output, last 24 h labs and trends, and last 24 h imaging results.   LOS: 36 days    Jacinto Halim , [facility] 11/13/2023, 10:44 AM Please see Amion for pager number during day hours 7:00am-4:30pm
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
## 2023-11-13 NOTE — TOC Progression Note (Signed)
Transition of Care [facility]) - Progression Note    Patient Details  Name: [name] [name] MRN: [id number] Date of Birth: 1961-11-04  Transition of Care [facility]) CM/SW Contact  Glennon Mac, RN Phone Number: 11/13/2023, 12:30pm  Clinical Narrative:    Met with patient to discuss discharge plans.  Apparently his mother has spoken with him about her inability to care for him due to caring for patient's father, who has dementia.  He understands this, and knows that he will need some sort of rehab. He requests assistance with finding SNF for rehab; will initiate FL2 and bed search.  May need LOG due to managed Medicaid unable to cover SNF cost.    Expected Discharge Plan: Skilled Nursing Facility Barriers to Discharge: Continued Medical Work up  Expected Discharge Plan and Services   Discharge Planning Services: CM Consult                                           Social Determinants of Health (SDOH) Interventions SDOH Screenings   Food Insecurity: Patient Unable To Answer (10/18/2023)  Housing: Patient Unable To Answer (10/18/2023)  Transportation Needs: Patient Unable To Answer (10/18/2023)  Utilities: Patient Unable To Answer (10/18/2023)    Readmission Risk Interventions     No data to display         Quintella Baton, RN, BSN  Trauma/Neuro ICU Case Manager [phone]

## 2023-11-14 ENCOUNTER — Inpatient Hospital Stay (HOSPITAL_COMMUNITY): Payer: MEDICAID

## 2023-11-14 LAB — GLUCOSE, CAPILLARY: Glucose-Capillary: 104 mg/dL — ABNORMAL HIGH (ref 70–99)
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
## 2023-11-14 NOTE — [place] FL2 (Signed)
[place] MEDICAID FL2 LEVEL OF CARE FORM     IDENTIFICATION  Patient Name: [name] [name] Birthdate: [birth year] Sex: male Admission Date (Current Location): 10/08/2023  [place] and [place] Number:  [name] [id number] T Facility and Address:  [facility], [address], [place], [place] [address]      Provider Number: [id number]  Attending Physician Name and Address:  Dr. Kris Mouton  Relative Name and Phone Number:  Romano Gilpatrick, mother: phone: [phone]    Current Level of Care: SNF Recommended Level of Care: Skilled Nursing Facility Prior Approval Number:    Date Approved/Denied:   PASRR Number: Pending  Discharge Plan: SNF    Current Diagnoses: Patient Active Problem List   Diagnosis Date Noted   Malnutrition of moderate degree 11/04/2023   Acute stress disorder 11/04/2023   Status post surgery 10/08/2023    Orientation RESPIRATION BLADDER Height & Weight     Self, Time, Place, Situation  Normal Continent Weight: 55.4 kg Height:  5\' 6"  (167.6 cm)  BEHAVIORAL SYMPTOMS/MOOD NEUROLOGICAL BOWEL NUTRITION STATUS      Continent Diet (reg, thin liquids)  AMBULATORY STATUS COMMUNICATION OF NEEDS Skin   Extensive Assist Verbally Surgical wounds (midline wound wet to dry drsg BID)                       Personal Care Assistance Level of Assistance  Bathing, Feeding, Dressing Bathing Assistance: Limited assistance Feeding assistance: Independent Dressing Assistance: Limited assistance     Functional Limitations Info             SPECIAL CARE FACTORS FREQUENCY  Speech therapy     PT Frequency: 5x weekly OT Frequency: 5x weekly     Speech Therapy Frequency: 5x weekly      Contractures Contractures Info: Not present    Additional Factors Info  Code Status, Allergies Code Status Info: Full Allergies Info: NKDA           Current Medications (11/14/2023):  This is the current hospital active medication  list Current Facility-Administered Medications  Medication Dose Route Frequency Provider Last Rate Last Admin   (feeding supplement) PROSource Plus liquid 30 mL  30 mL Oral BID BM Violeta Gelinas, MD   30 mL at 11/14/23 1636   acetaminophen (TYLENOL) tablet 1,000 mg  1,000 mg Oral Q6H Barnetta Chapel, PA-C   1,000 mg at 11/14/23 1635   aspirin EC tablet 81 mg  81 mg Oral Daily Emilie Rutter, PA-C   81 mg at 11/14/23 1003   bethanechol (URECHOLINE) tablet 5 mg  5 mg Oral TID Barnetta Chapel, PA-C   5 mg at 11/14/23 1636   cholecalciferol (VITAMIN D3) 25 MCG (1000 UNIT) tablet 1,000 Units  1,000 Units Oral Daily Barnetta Chapel, PA-C   1,000 Units at 11/14/23 1003   clonazePAM (KLONOPIN) tablet 0.5 mg  0.5 mg Oral BID Barnetta Chapel, PA-C   0.5 mg at 11/14/23 1003   cyclobenzaprine (FLEXERIL) tablet 5 mg  5 mg Oral TID Jacinto Halim, PA-C   5 mg at 11/14/23 1636   docusate (COLACE) 50 MG/5ML liquid 100 mg  100 mg Oral BID Barnetta Chapel, PA-C   100 mg at 11/14/23 1005   DULoxetine (CYMBALTA) DR capsule 60 mg  60 mg Oral Daily Meryl Dare, MD   60 mg at 11/14/23 1003   enoxaparin (LOVENOX) injection 30 mg  30 mg Subcutaneous Q12H Thyra Breed A, PA-C   30 mg at 11/14/23 1006  feeding supplement (ENSURE ENLIVE / ENSURE PLUS) liquid 237 mL  237 mL Oral BID BM Diamantina Monks, MD   237 mL at 11/14/23 1637   folic acid (FOLVITE) tablet 1 mg  1 mg Oral Daily Barnetta Chapel, PA-C   1 mg at 11/14/23 1004   guaiFENesin (ROBITUSSIN) 100 MG/5ML liquid 10 mL  10 mL Oral Q6H Barnetta Chapel, PA-C   10 mL at 11/13/23 0518   hydrALAZINE (APRESOLINE) injection 10 mg  10 mg Intravenous Q6H PRN [place] Bali, PA-C   10 mg at 10/15/23 1813   hydrOXYzine (ATARAX) tablet 25 mg  25 mg Oral BID PRN Meryl Dare, MD   25 mg at 11/14/23 0732   ibuprofen (ADVIL) 100 MG/5ML suspension 600 mg  600 mg Oral Q6H PRN Barnetta Chapel, PA-C       ipratropium-albuterol (DUONEB) 0.5-2.5 (3) MG/3ML nebulizer solution 3 mL   3 mL Nebulization Q6H PRN Maczis, Elmer Sow, PA-C       melatonin tablet 3 mg  3 mg Oral QHS Barnetta Chapel, PA-C   3 mg at 11/13/23 2222   metoprolol tartrate (LOPRESSOR) injection 5 mg  5 mg Intravenous Q6H PRN [place], PA-C   2.5 mg at 10/15/23 1542   multivitamin with minerals tablet 1 tablet  1 tablet Oral Daily Barnetta Chapel, PA-C   1 tablet at 11/14/23 1003   ondansetron (ZOFRAN) injection 4 mg  4 mg Intravenous Q8H PRN Fritzi Mandes, MD   4 mg at 11/09/23 1103   oxyCODONE (Oxy IR/ROXICODONE) immediate release tablet 5-10 mg  5-10 mg Oral Q4H PRN Barnetta Chapel, PA-C   10 mg at 11/14/23 1635   polyethylene glycol (MIRALAX / GLYCOLAX) packet 17 g  17 g Oral BID Jacinto Halim, PA-C   17 g at 11/14/23 1002   QUEtiapine (SEROQUEL) tablet 50 mg  50 mg Oral QHS Barnetta Chapel, PA-C   50 mg at 11/13/23 2222   sucralfate (CARAFATE) 1 GM/10ML suspension 1 g  1 g Oral TID WC & HS Jacinto Halim, PA-C   1 g at 11/14/23 1635   tamsulosin (FLOMAX) capsule 0.4 mg  0.4 mg Oral Daily Barnetta Chapel, PA-C   0.4 mg at 11/14/23 1003   thiamine (VITAMIN B1) tablet 100 mg  100 mg Oral Daily Barnetta Chapel, PA-C   100 mg at 11/14/23 1003   Or   thiamine (VITAMIN B1) injection 100 mg  100 mg Intravenous Daily Barnetta Chapel, PA-C       traZODone (DESYREL) tablet 50 mg  50 mg Oral QHS Akintayo, Musa A, MD   50 mg at 11/13/23 2222     Discharge Medications: Please see discharge summary for a list of discharge medications.  Relevant Imaging Results:  Relevant Lab Results:   Additional Information SSN [id number]  Quintella Baton, RN, BSN  Trauma/Neuro ICU Case Manager [phone]
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
## 2023-11-14 NOTE — Progress Notes (Signed)
RN did pts dressing change pt tolerated well no complaints.
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
## 2023-11-15 NOTE — Plan of Care (Signed)
  Problem: Nutrition: Goal: Adequate nutrition will be maintained Outcome: Progressing   Problem: Pain Management: Goal: Pain level will decrease Outcome: Progressing

## 2023-11-15 NOTE — Plan of Care (Signed)
  Problem: Clinical Measurements: Goal: Will remain free from infection Outcome: Progressing   Problem: Nutrition: Goal: Adequate nutrition will be maintained Outcome: Progressing   Problem: Pain Management: Goal: Pain level will decrease 11/15/2023 0239 by Sammuel Cooper, RN Outcome: Progressing 11/15/2023 0239 by Sammuel Cooper, RN Outcome: Progressing

## 2023-11-15 NOTE — Progress Notes (Signed)
29 Days Post-Op  Subjective: CC: Doing well. Tolerating diet and finishing >=50% trays + 3 ensures/day without abdominal pain, n/v. Passing flatus. Small BM yesterday. Voiding. No dysuria or hematuria. R leg spasms improved. Working with therapies.   Afebrile. HR 90's this am. No hypotension. On RA.   Objective: Vital signs in last 24 hours: Temp:  [97.7 F (36.5 C)-98.6 F (37 C)] 98.6 F (37 C) (12/19 0357) Pulse Rate:  [97-117] 97 (12/19 0357) Resp:  [16-18] 18 (12/19 0357) BP: (106-114)/(65-83) 108/72 (12/19 0357) SpO2:  [97 %-100 %] 97 % (12/19 0357) Last BM Date : 11/14/23  Intake/Output from previous day: 12/18 0701 - 12/19 0700 In: 240 [P.O.:240] Out: 400 [Urine:400] Intake/Output this shift: No intake/output data recorded.  PE: Gen:  Alert, NAD, pleasant Card:  Reg Pulm:  CTAB, no W/R/R, effort normal Abd: Soft, ND, NT, +BS, wound stable from picture from 12/16  Ext: R hip hematoma and induration softer, without overlying erythema/heat. No LE edema. Wiggles digits of b/l LE's. SILT to BLE"s. DP 2+ b/l.  Psych: A&Ox3   Lab Results:  No results for input(s): "WBC", "HGB", "HCT", "PLT" in the last 72 hours.  BMET No results for input(s): "NA", "K", "CL", "CO2", "GLUCOSE", "BUN", "CREATININE", "CALCIUM" in the last 72 hours.  PT/INR No results for input(s): "LABPROT", "INR" in the last 72 hours. CMP     Component Value Date/Time   NA 134 (L) 11/11/2023 0545   K 4.1 11/11/2023 0545   CL 93 (L) 11/11/2023 0545   CO2 27 11/11/2023 0545   GLUCOSE 92 11/11/2023 0545   BUN 14 11/11/2023 0545   CREATININE 1.12 11/11/2023 0545   CALCIUM 8.9 11/11/2023 0545   PROT 7.1 10/25/2023 0520   ALBUMIN 2.4 (L) 10/25/2023 0520   AST 47 (H) 10/25/2023 0520   ALT 45 (H) 10/25/2023 0520   ALKPHOS 127 (H) 10/25/2023 0520   BILITOT 2.5 (H) 10/25/2023 0520   GFRNONAA >60 11/11/2023 0545   Lipase  No results found for: "LIPASE"  Studies/Results: DG Pelvis Comp Min  3V Result Date: 11/14/2023 CLINICAL DATA:  Pelvic fracture status post ORIF EXAM: JUDET PELVIS - 3+ VIEW COMPARISON:  None Available. FINDINGS: Frontal, inlet, outlet, and bilateral Judet views of the pelvis are obtained. Cannulated screws are again seen traversing the bilateral sacroiliac joints and left superior pubic ramus. No significant change in alignment of the comminuted right iliac fracture, bilateral superior and inferior pubic rami fractures, and the right acetabular fracture seen previously. No significant interval callus formation. Hips remain in anatomic alignment. Continued diffuse subcutaneous edema. IMPRESSION: 1. Stable right iliac, bilateral superior and inferior pubic rami, and right acetabular fractures. Stable orthopedic hardware across the bilateral sacroiliac joints and left superior pubic ramus fracture. Electronically Signed   By: Sharlet Salina M.D.   On: 11/14/2023 14:44    Anti-infectives: Anti-infectives (From admission, onward)    Start     Dose/Rate Route Frequency Ordered Stop   10/24/23 1800  ceFEPIme (MAXIPIME) 2 g in sodium chloride 0.9 % 100 mL IVPB        2 g 200 mL/hr over 30 Minutes Intravenous Every 12 hours 10/24/23 1108 10/26/23 1813   10/21/23 1400  ceFEPIme (MAXIPIME) 2 g in sodium chloride 0.9 % 100 mL IVPB  Status:  Discontinued        2 g 200 mL/hr over 30 Minutes Intravenous Every 8 hours 10/21/23 0844 10/24/23 1108   10/20/23 1900  ceFEPIme (MAXIPIME) 2  g in sodium chloride 0.9 % 100 mL IVPB  Status:  Discontinued        2 g 200 mL/hr over 30 Minutes Intravenous Every 12 hours 10/20/23 0912 10/21/23 0844   10/19/23 2300  ceFEPIme (MAXIPIME) 2 g in sodium chloride 0.9 % 100 mL IVPB  Status:  Discontinued        2 g 200 mL/hr over 30 Minutes Intravenous Every 8 hours 10/19/23 2157 10/20/23 0912   10/17/23 2200  ceFAZolin (ANCEF) IVPB 2g/100 mL premix        2 g 200 mL/hr over 30 Minutes Intravenous Every 8 hours 10/17/23 1541 10/18/23 1417    10/12/23 1745  ceFEPIme (MAXIPIME) 2 g in sodium chloride 0.9 % 100 mL IVPB  Status:  Discontinued        2 g 200 mL/hr over 30 Minutes Intravenous Every 8 hours 10/12/23 1319 10/14/23 1037   10/11/23 0945  ceFEPIme (MAXIPIME) 2 g in sodium chloride 0.9 % 100 mL IVPB  Status:  Discontinued        2 g 200 mL/hr over 30 Minutes Intravenous Every 12 hours 10/11/23 0846 10/12/23 1319        Assessment/Plan PHBC   S/P ex lap, pelvic packing (5 laps), and temporary abdominal closure with vicryl mesh by Dr. Dossie Der 11/12 - return to OR 11/13 for ex lap, removal of packs and Abthera placement by Dr. Janee Morn - extensive mesenteric contusions but bowel viable, S/P ex lap and closure 11/18 Dr. Bedelia Person. BID WTD midline. On regular diet and having bowel function.  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 Vascular reviewed f/u CTA. F/u 1 year.  R acetabular FX, B superior and inferior rami FXs, R iliac FX, L sacral FX - Dr. Hulda Humphrey consulted, R femur in skeletal traction, S/P SI screw and L acetab perc fixation 11/20 by Dr. Jena Gauss. RLE traction as definitive management for a total of 4 weeks (~12/18).  CT 12/11 showed R hip hematoma. RLE Traction off and WB updated per Dr. Jena Gauss WBAT LLE for transfers. Continue NWB RLE. Repeat xrays done 12/18. Stable for d/c from ortho standpoint. On Vit D.  Pelvic hemorrhage - packed as above, S/P IR embolization by Dr. Elby Showers 11/12 Acute hypoxic respiratory failure - TRALI, extubated 11/24, guaifenesin for secretions. IS and flutter valve. NT suction as needed. Duoneb prn.  Right gluteal hematoma Right psoas hematoma Right 11th rib fracture Multiple lumbar vertebral body transverse process fractures Right scalp hematoma ABL anemia - Hgb stable on last check.  Tachycardia - Improved  ID - Completed 7 day course for enterobacter and kleb pna. None currently. FEN - Reg diet.  Agitation -  Klonopin 0.5mg  BID for anxiety. Seroquel at HS, PRN Haldol. On  Trazodone, Cymbalta, and Hydroxyzine per psych. Psych recommends therapy/psychiatry referral upon DC VTE - SCDs, LMWH. Will need ASA 81 mg daily x 30 days for DVT ppx per Ortho  Foley/urinary retention - Placed 12/6 for retention. Urecholine, UA neg. Flomax, DC foley 12/11. Spont void Dispo - Med-surg.  Therapies. LOG SNF.   I reviewed nursing notes, last 24 h vitals and pain scores, last 48 h intake and output, last 24 h labs and trends, and last 24 h imaging results.   LOS: 38 days    Jacinto Halim , [facility] Surgery 11/15/2023, 8:35 AM Please see Amion for pager number during day hours 7:00am-4:30pm
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
## 2023-11-15 NOTE — TOC Progression Note (Signed)
Transition of Care [facility]) - Progression Note    Patient Details  Name: [name] [name] MRN: [id number] Date of Birth: 08/31/1961  [facility] CM/SW Contact  Glennon Mac, RN Phone Number: 11/15/2023, 5:21 PM  Clinical Narrative:    Currently no bed offers available for patient. Will continue to follow.    Expected Discharge Plan: Skilled Nursing Facility Barriers to Discharge: Continued Medical Work up  Expected Discharge Plan and Services   Discharge Planning Services: CM Consult                                           Social Determinants of Health (SDOH) Interventions SDOH Screenings   Food Insecurity: Patient Unable To Answer (10/18/2023)  Housing: Patient Unable To Answer (10/18/2023)  Transportation Needs: Patient Unable To Answer (10/18/2023)  Utilities: Patient Unable To Answer (10/18/2023)    Readmission Risk Interventions     No data to display         Quintella Baton, RN, BSN  Trauma/Neuro ICU Case Manager [phone]
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
## 2023-11-16 NOTE — Plan of Care (Signed)
  Problem: Nutrition: Goal: Adequate nutrition will be maintained Outcome: Progressing   Problem: Self-Concept: Goal: Ability to maintain and perform role responsibilities to the fullest extent possible will improve Outcome: Progressing   Problem: Pain Management: Goal: Pain level will decrease Outcome: Progressing

## 2023-11-16 NOTE — TOC Progression Note (Signed)
Transition of Care [facility]) - Progression Note    Patient Details  Name: [name] [name] MRN: [id number] Date of Birth: [birth year]  Transition of Care [facility]) CM/SW Contact  Glennon Mac, RN Phone Number: 11/16/2023, 2:51 PM  Clinical Narrative:    [facility] still considering patient for potential admission; left message for [place] in admissions, requesting call back.    Expected Discharge Plan: Skilled Nursing Facility Barriers to Discharge: Continued Medical Work up  Expected Discharge Plan and Services   Discharge Planning Services: CM Consult                                           Social Determinants of Health (SDOH) Interventions SDOH Screenings   Food Insecurity: Patient Unable To Answer (10/18/2023)  Housing: Patient Unable To Answer (10/18/2023)  Transportation Needs: Patient Unable To Answer (10/18/2023)  Utilities: Patient Unable To Answer (10/18/2023)    Readmission Risk Interventions     No data to display         Quintella Baton, RN, BSN  Trauma/Neuro ICU Case Manager [phone]

## 2023-11-16 NOTE — Progress Notes (Signed)
30 Days Post-Op  Subjective: CC: NAEO. Tolerating PO. Having bowel function. Drinking 3 ensure daily.   Afebrile. HR 90-107bpm this am. No hypotension. On RA.   Objective: Vital signs in last 24 hours: Temp:  [98 F (36.7 C)-98.8 F (37.1 C)] 98.6 F (37 C) (12/20 0735) Pulse Rate:  [91-107] 107 (12/20 0735) Resp:  [16-19] 16 (12/20 0735) BP: (112-131)/(68-86) 112/68 (12/20 0735) SpO2:  [94 %-100 %] 99 % (12/20 0735) Last BM Date : 11/14/23  Intake/Output from previous day: 12/19 0701 - 12/20 0700 In: -  Out: 1025 [Urine:1025] Intake/Output this shift: No intake/output data recorded.  PE: Gen:  Alert, NAD, pleasant Card:  Reg Pulm:  CTAB, no W/R/R, effort normal Abd: Soft, ND, NT, +BS, wound stable from picture from 12/16  Ext: R hip hematoma and induration softer, without overlying erythema/heat. No LE edema. Wiggles digits of b/l LE's. SILT to BLE"s. DP 2+ b/l.  Psych: A&Ox3   Lab Results:  No results for input(s): "WBC", "HGB", "HCT", "PLT" in the last 72 hours.  BMET No results for input(s): "NA", "K", "CL", "CO2", "GLUCOSE", "BUN", "CREATININE", "CALCIUM" in the last 72 hours.  PT/INR No results for input(s): "LABPROT", "INR" in the last 72 hours. CMP     Component Value Date/Time   NA 134 (L) 11/11/2023 0545   K 4.1 11/11/2023 0545   CL 93 (L) 11/11/2023 0545   CO2 27 11/11/2023 0545   GLUCOSE 92 11/11/2023 0545   BUN 14 11/11/2023 0545   CREATININE 1.12 11/11/2023 0545   CALCIUM 8.9 11/11/2023 0545   PROT 7.1 10/25/2023 0520   ALBUMIN 2.4 (L) 10/25/2023 0520   AST 47 (H) 10/25/2023 0520   ALT 45 (H) 10/25/2023 0520   ALKPHOS 127 (H) 10/25/2023 0520   BILITOT 2.5 (H) 10/25/2023 0520   GFRNONAA >60 11/11/2023 0545   Lipase  No results found for: "LIPASE"  Studies/Results: DG Pelvis Comp Min 3V Result Date: 11/14/2023 CLINICAL DATA:  Pelvic fracture status post ORIF EXAM: JUDET PELVIS - 3+ VIEW COMPARISON:  None Available. FINDINGS:  Frontal, inlet, outlet, and bilateral Judet views of the pelvis are obtained. Cannulated screws are again seen traversing the bilateral sacroiliac joints and left superior pubic ramus. No significant change in alignment of the comminuted right iliac fracture, bilateral superior and inferior pubic rami fractures, and the right acetabular fracture seen previously. No significant interval callus formation. Hips remain in anatomic alignment. Continued diffuse subcutaneous edema. IMPRESSION: 1. Stable right iliac, bilateral superior and inferior pubic rami, and right acetabular fractures. Stable orthopedic hardware across the bilateral sacroiliac joints and left superior pubic ramus fracture. Electronically Signed   By: Sharlet Salina M.D.   On: 11/14/2023 14:44    Anti-infectives: Anti-infectives (From admission, onward)    Start     Dose/Rate Route Frequency Ordered Stop   10/24/23 1800  ceFEPIme (MAXIPIME) 2 g in sodium chloride 0.9 % 100 mL IVPB        2 g 200 mL/hr over 30 Minutes Intravenous Every 12 hours 10/24/23 1108 10/26/23 1813   10/21/23 1400  ceFEPIme (MAXIPIME) 2 g in sodium chloride 0.9 % 100 mL IVPB  Status:  Discontinued        2 g 200 mL/hr over 30 Minutes Intravenous Every 8 hours 10/21/23 0844 10/24/23 1108   10/20/23 1900  ceFEPIme (MAXIPIME) 2 g in sodium chloride 0.9 % 100 mL IVPB  Status:  Discontinued        2 g  200 mL/hr over 30 Minutes Intravenous Every 12 hours 10/20/23 0912 10/21/23 0844   10/19/23 2300  ceFEPIme (MAXIPIME) 2 g in sodium chloride 0.9 % 100 mL IVPB  Status:  Discontinued        2 g 200 mL/hr over 30 Minutes Intravenous Every 8 hours 10/19/23 2157 10/20/23 0912   10/17/23 2200  ceFAZolin (ANCEF) IVPB 2g/100 mL premix        2 g 200 mL/hr over 30 Minutes Intravenous Every 8 hours 10/17/23 1541 10/18/23 1417   10/12/23 1745  ceFEPIme (MAXIPIME) 2 g in sodium chloride 0.9 % 100 mL IVPB  Status:  Discontinued        2 g 200 mL/hr over 30 Minutes Intravenous  Every 8 hours 10/12/23 1319 10/14/23 1037   10/11/23 0945  ceFEPIme (MAXIPIME) 2 g in sodium chloride 0.9 % 100 mL IVPB  Status:  Discontinued        2 g 200 mL/hr over 30 Minutes Intravenous Every 12 hours 10/11/23 0846 10/12/23 1319        Assessment/Plan PHBC   S/P ex lap, pelvic packing (5 laps), and temporary abdominal closure with vicryl mesh by Dr. Dossie Der 11/12 - return to OR 11/13 for ex lap, removal of packs and Abthera placement by Dr. Janee Morn - extensive mesenteric contusions but bowel viable, S/P ex lap and closure 11/18 Dr. Bedelia Person. BID WTD midline. On regular diet and having bowel function.  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 Vascular reviewed f/u CTA. F/u 1 year.  R acetabular FX, B superior and inferior rami FXs, R iliac FX, L sacral FX - Dr. Hulda Humphrey consulted, R femur in skeletal traction, S/P SI screw and L acetab perc fixation 11/20 by Dr. Jena Gauss. RLE traction as definitive management for a total of 4 weeks (~12/18).  CT 12/11 showed R hip hematoma. RLE Traction off and WB updated per Dr. Jena Gauss WBAT LLE for transfers. Continue NWB RLE. Repeat xrays done 12/18. Stable for d/c from ortho standpoint. On Vit D.  Pelvic hemorrhage - packed as above, S/P IR embolization by Dr. Elby Showers 11/12 Acute hypoxic respiratory failure - TRALI, extubated 11/24, guaifenesin for secretions. IS and flutter valve. NT suction as needed. Duoneb prn.  Right gluteal hematoma Right psoas hematoma Right 11th rib fracture Multiple lumbar vertebral body transverse process fractures Right scalp hematoma ABL anemia - Hgb stable on last check.  Tachycardia - Improved  ID - Completed 7 day course for enterobacter and kleb pna. None currently. FEN - Reg diet.  Agitation -  Klonopin 0.5mg  BID for anxiety. Seroquel at HS, PRN Haldol. On Trazodone, Cymbalta, and Hydroxyzine per psych. Psych recommends therapy/psychiatry referral upon DC VTE - SCDs, LMWH. Will need ASA 81 mg daily x 30 days  for DVT ppx per Ortho  Foley/urinary retention - Placed 12/6 for retention. Urecholine, UA neg. Flomax, DC foley 12/11. Spont void Dispo - Med-surg.  Therapies. LOG SNF.   I reviewed nursing notes, last 24 h vitals and pain scores, last 48 h intake and output, last 24 h labs and trends, and last 24 h imaging results.   LOS: 39 days    Adam Phenix , [facility] 11/16/2023, 8:12 AM Please see Amion for pager number during day hours 7:00am-4:30pm

## 2023-11-16 NOTE — Plan of Care (Signed)
  Problem: Activity: Goal: Ability to ambulate and perform ADLs will improve Outcome: Progressing   Problem: Clinical Measurements: Goal: Postoperative complications will be avoided or minimized Outcome: Progressing   Problem: Pain Management: Goal: Pain level will decrease Outcome: Progressing   

## 2023-11-17 NOTE — Progress Notes (Signed)
31 Days Post-Op   Subjective/Chief Complaint: No complaints   Objective: Vital signs in last 24 hours: Temp:  [97.8 F (36.6 C)-98.6 F (37 C)] 97.8 F (36.6 C) (12/21 0833) Pulse Rate:  [93-99] 99 (12/21 0833) Resp:  [16-18] 17 (12/21 0833) BP: (109-120)/(72-82) 109/72 (12/21 0833) SpO2:  [97 %-100 %] 99 % (12/21 0833) Last BM Date : 11/14/23  Intake/Output from previous day: 12/20 0701 - 12/21 0700 In: -  Out: 875 [Urine:875] Intake/Output this shift: Total I/O In: 240 [P.O.:240] Out: -    Gen:  Alert, NAD, pleasant Card:  Reg Pulm:  CTAB, no W/R/R, effort normal Abd: Soft, ND, NT, +BS, wound stable from picture from 12/16  Ext: R hip hematoma and induration STABLE  No LE edema. Wiggles digits of b/l LE's. SILT to BLE"s. DP 2+ b/l.  Psych: A&Ox3     Lab Results:  No results for input(s): "WBC", "HGB", "HCT", "PLT" in the last 72 hours. BMET No results for input(s): "NA", "K", "CL", "CO2", "GLUCOSE", "BUN", "CREATININE", "CALCIUM" in the last 72 hours. PT/INR No results for input(s): "LABPROT", "INR" in the last 72 hours. ABG No results for input(s): "PHART", "HCO3" in the last 72 hours.  Invalid input(s): "PCO2", "PO2"  Studies/Results: No results found.  Anti-infectives: Anti-infectives (From admission, onward)    Start     Dose/Rate Route Frequency Ordered Stop   10/24/23 1800  ceFEPIme (MAXIPIME) 2 g in sodium chloride 0.9 % 100 mL IVPB        2 g 200 mL/hr over 30 Minutes Intravenous Every 12 hours 10/24/23 1108 10/26/23 1813   10/21/23 1400  ceFEPIme (MAXIPIME) 2 g in sodium chloride 0.9 % 100 mL IVPB  Status:  Discontinued        2 g 200 mL/hr over 30 Minutes Intravenous Every 8 hours 10/21/23 0844 10/24/23 1108   10/20/23 1900  ceFEPIme (MAXIPIME) 2 g in sodium chloride 0.9 % 100 mL IVPB  Status:  Discontinued        2 g 200 mL/hr over 30 Minutes Intravenous Every 12 hours 10/20/23 0912 10/21/23 0844   10/19/23 2300  ceFEPIme (MAXIPIME) 2 g in  sodium chloride 0.9 % 100 mL IVPB  Status:  Discontinued        2 g 200 mL/hr over 30 Minutes Intravenous Every 8 hours 10/19/23 2157 10/20/23 0912   10/17/23 2200  ceFAZolin (ANCEF) IVPB 2g/100 mL premix        2 g 200 mL/hr over 30 Minutes Intravenous Every 8 hours 10/17/23 1541 10/18/23 1417   10/12/23 1745  ceFEPIme (MAXIPIME) 2 g in sodium chloride 0.9 % 100 mL IVPB  Status:  Discontinued        2 g 200 mL/hr over 30 Minutes Intravenous Every 8 hours 10/12/23 1319 10/14/23 1037   10/11/23 0945  ceFEPIme (MAXIPIME) 2 g in sodium chloride 0.9 % 100 mL IVPB  Status:  Discontinued        2 g 200 mL/hr over 30 Minutes Intravenous Every 12 hours 10/11/23 0846 10/12/23 1319       Assessment/Plan: PHBC   S/P ex lap, pelvic packing (5 laps), and temporary abdominal closure with vicryl mesh by Dr. Dossie Der 11/12 - return to OR 11/13 for ex lap, removal of packs and Abthera placement by Dr. Janee Morn - extensive mesenteric contusions but bowel viable, S/P ex lap and closure 11/18 Dr. Bedelia Person. BID WTD midline. On regular diet and having bowel function.  Aortic transsection - Dr.  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 Vascular reviewed f/u CTA. F/u 1 year.  R acetabular FX, B superior and inferior rami FXs, R iliac FX, L sacral FX - Dr. Hulda Humphrey consulted, R femur in skeletal traction, S/P SI screw and L acetab perc fixation 11/20 by Dr. Jena Gauss. RLE traction as definitive management for a total of 4 weeks (~12/18).  CT 12/11 showed R hip hematoma. RLE Traction off and WB updated per Dr. Jena Gauss WBAT LLE for transfers. Continue NWB RLE. Repeat xrays done 12/18. Stable for d/c from ortho standpoint. On Vit D.  Pelvic hemorrhage - packed as above, S/P IR embolization by Dr. Elby Showers 11/12 Acute hypoxic respiratory failure - TRALI, extubated 11/24, guaifenesin for secretions. IS and flutter valve. NT suction as needed. Duoneb prn.  Right gluteal hematoma Right psoas hematoma Right 11th rib fracture Multiple lumbar  vertebral body transverse process fractures Right scalp hematoma ABL anemia - Hgb stable on last check.  Tachycardia - Improved  ID - Completed 7 day course for enterobacter and kleb pna. None currently. FEN - Reg diet.  Agitation -  Klonopin 0.5mg  BID for anxiety. Seroquel at HS, PRN Haldol. On Trazodone, Cymbalta, and Hydroxyzine per psych. Psych recommends therapy/psychiatry referral upon DC VTE - SCDs, LMWH. Will need ASA 81 mg daily x 30 days for DVT ppx per Ortho  Foley/urinary retention - Placed 12/6 for retention. Urecholine, UA neg. Flomax, DC foley 12/11. Spont void Dispo - Med-surg.  Therapies. LOG SNF.    I reviewed nursing notes, last 24 h vitals and pain scores, last 48 h intake and output, last 24 h labs and trends, and last 24 h imaging results.   LOS: 40 days    Clovis Pu Lovell Nuttall MD  11/17/2023 LOW COMPLEXITY

## 2023-11-17 NOTE — Plan of Care (Signed)
  Problem: Clinical Measurements: Goal: Will remain free from infection Outcome: Progressing Goal: Respiratory complications will improve Outcome: Progressing   Problem: Nutrition: Goal: Adequate nutrition will be maintained Outcome: Progressing   Problem: Skin Integrity: Goal: Risk for impaired skin integrity will decrease Outcome: Progressing   Problem: Education: Goal: Verbalization of understanding the information provided (i.e., activity precautions, restrictions, etc) will improve Outcome: Progressing Goal: Individualized Educational Video(s) Outcome: Progressing   Problem: Activity: Goal: Ability to ambulate and perform ADLs will improve Outcome: Progressing   Problem: Clinical Measurements: Goal: Postoperative complications will be avoided or minimized Outcome: Progressing   Problem: Self-Concept: Goal: Ability to maintain and perform role responsibilities to the fullest extent possible will improve Outcome: Progressing   Problem: Pain Management: Goal: Pain level will decrease Outcome: Progressing

## 2023-11-17 NOTE — Plan of Care (Signed)
  Problem: Nutrition: Goal: Adequate nutrition will be maintained Outcome: Progressing   Problem: Pain Management: Goal: Pain level will decrease Outcome: Progressing

## 2023-11-18 ENCOUNTER — Other Ambulatory Visit: Payer: Self-pay

## 2023-11-18 ENCOUNTER — Encounter (HOSPITAL_COMMUNITY): Payer: Self-pay

## 2023-11-18 MED ORDER — ORAL CARE MOUTH RINSE: 15.0000 mL | OROMUCOSAL | Status: DC | PRN

## 2023-11-18 NOTE — Progress Notes (Signed)
32 Days Post-Op   Subjective/Chief Complaint: No complaints    Objective: Vital signs in last 24 hours: Temp:  [98.2 F (36.8 C)-98.7 F (37.1 C)] 98.7 F (37.1 C) (12/22 0730) Pulse Rate:  [91-103] 95 (12/22 0730) Resp:  [17-19] 17 (12/22 0730) BP: (105-125)/(72-89) 105/77 (12/22 0730) SpO2:  [96 %-100 %] 96 % (12/22 0730) Last BM Date : 11/17/23  Intake/Output from previous day: 12/21 0701 - 12/22 0700 In: 360 [P.O.:360] Out: 1200 [Urine:1200] Intake/Output this shift: No intake/output data recorded.  Gen:  Alert, NAD, pleasant Card:  Reg Pulm:  CTAB, no W/R/R, effort normal Abd: Soft, ND, NT, +BS, wound with mild fibrinous exudate  Ext: R hip hematoma and induration STABLE  No LE edema. Wiggles digits of b/l LE's. SILT to BLE"s. DP 2+ b/l.  Psych: A&Ox3   Lab Results:  No results for input(s): "WBC", "HGB", "HCT", "PLT" in the last 72 hours. BMET No results for input(s): "NA", "K", "CL", "CO2", "GLUCOSE", "BUN", "CREATININE", "CALCIUM" in the last 72 hours. PT/INR No results for input(s): "LABPROT", "INR" in the last 72 hours. ABG No results for input(s): "PHART", "HCO3" in the last 72 hours.  Invalid input(s): "PCO2", "PO2"  Studies/Results: No results found.  Anti-infectives: Anti-infectives (From admission, onward)    Start     Dose/Rate Route Frequency Ordered Stop   10/24/23 1800  ceFEPIme (MAXIPIME) 2 g in sodium chloride 0.9 % 100 mL IVPB        2 g 200 mL/hr over 30 Minutes Intravenous Every 12 hours 10/24/23 1108 10/26/23 1813   10/21/23 1400  ceFEPIme (MAXIPIME) 2 g in sodium chloride 0.9 % 100 mL IVPB  Status:  Discontinued        2 g 200 mL/hr over 30 Minutes Intravenous Every 8 hours 10/21/23 0844 10/24/23 1108   10/20/23 1900  ceFEPIme (MAXIPIME) 2 g in sodium chloride 0.9 % 100 mL IVPB  Status:  Discontinued        2 g 200 mL/hr over 30 Minutes Intravenous Every 12 hours 10/20/23 0912 10/21/23 0844   10/19/23 2300  ceFEPIme (MAXIPIME) 2 g  in sodium chloride 0.9 % 100 mL IVPB  Status:  Discontinued        2 g 200 mL/hr over 30 Minutes Intravenous Every 8 hours 10/19/23 2157 10/20/23 0912   10/17/23 2200  ceFAZolin (ANCEF) IVPB 2g/100 mL premix        2 g 200 mL/hr over 30 Minutes Intravenous Every 8 hours 10/17/23 1541 10/18/23 1417   10/12/23 1745  ceFEPIme (MAXIPIME) 2 g in sodium chloride 0.9 % 100 mL IVPB  Status:  Discontinued        2 g 200 mL/hr over 30 Minutes Intravenous Every 8 hours 10/12/23 1319 10/14/23 1037   10/11/23 0945  ceFEPIme (MAXIPIME) 2 g in sodium chloride 0.9 % 100 mL IVPB  Status:  Discontinued        2 g 200 mL/hr over 30 Minutes Intravenous Every 12 hours 10/11/23 0846 10/12/23 1319       Assessment/Plan:  PHBC   S/P ex lap, pelvic packing (5 laps), and temporary abdominal closure with vicryl mesh by Dr. Dossie Der 11/12 - return to OR 11/13 for ex lap, removal of packs and Abthera placement by Dr. Janee Morn - extensive mesenteric contusions but bowel viable, S/P ex lap and closure 11/18 Dr. Bedelia Person. BID WTD midline. On regular diet and having bowel function.  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 Vascular reviewed  f/u CTA. F/u 1 year.  R acetabular FX, B superior and inferior rami FXs, R iliac FX, L sacral FX - Dr. Hulda Humphrey consulted, R femur in skeletal traction, S/P SI screw and L acetab perc fixation 11/20 by Dr. Jena Gauss. RLE traction as definitive management for a total of 4 weeks (~12/18).  CT 12/11 showed R hip hematoma. RLE Traction off and WB updated per Dr. Jena Gauss WBAT LLE for transfers. Continue NWB RLE. Repeat xrays done 12/18. Stable for d/c from ortho standpoint. On Vit D.  Pelvic hemorrhage - packed as above, S/P IR embolization by Dr. Elby Showers 11/12 Acute hypoxic respiratory failure - TRALI, extubated 11/24, guaifenesin for secretions. IS and flutter valve. NT suction as needed. Duoneb prn.  Right gluteal hematoma Right psoas hematoma Right 11th rib fracture Multiple lumbar  vertebral body transverse process fractures Right scalp hematoma ABL anemia - Hgb stable on last check.  Tachycardia - Improved  ID - Completed 7 day course for enterobacter and kleb pna. None currently. FEN - Reg diet.  Agitation -  Klonopin 0.5mg  BID for anxiety. Seroquel at HS, PRN Haldol. On Trazodone, Cymbalta, and Hydroxyzine per psych. Psych recommends therapy/psychiatry referral upon DC VTE - SCDs, LMWH. Will need ASA 81 mg daily x 30 days for DVT ppx per Ortho  Foley/urinary retention - Placed 12/6 for retention. Urecholine, UA neg. Flomax, DC foley 12/11. Spont void Dispo - Med-surg.  Therapies. LOG SNF.    I reviewed nursing notes, last 24 h vitals and pain scores, last 48 h intake and output, last 24 h labs and trends, and last 24 h imaging results.   LOS: 41 days    Dortha Schwalbe  MD  11/18/2023 Low complexity

## 2023-11-19 MED ORDER — CYCLOBENZAPRINE HCL 5 MG PO TABS
7.5000 mg | ORAL_TABLET | Freq: Three times a day (TID) | ORAL | Status: DC
  Administered 2023-11-19 – 2023-11-30 (×34): 7.5 mg via ORAL
  Filled 2023-11-19 (×34): qty 2

## 2023-11-19 NOTE — Plan of Care (Signed)
  Problem: Clinical Measurements: Goal: Will remain free from infection Outcome: Progressing Goal: Respiratory complications will improve Outcome: Progressing   Problem: Nutrition: Goal: Adequate nutrition will be maintained Outcome: Progressing   Problem: Skin Integrity: Goal: Risk for impaired skin integrity will decrease Outcome: Progressing   Problem: Education: Goal: Verbalization of understanding the information provided (i.e., activity precautions, restrictions, etc) will improve Outcome: Progressing Goal: Individualized Educational Video(s) Outcome: Progressing   Problem: Activity: Goal: Ability to ambulate and perform ADLs will improve Outcome: Progressing   Problem: Clinical Measurements: Goal: Postoperative complications will be avoided or minimized Outcome: Progressing   Problem: Self-Concept: Goal: Ability to maintain and perform role responsibilities to the fullest extent possible will improve Outcome: Progressing   Problem: Pain Management: Goal: Pain level will decrease Outcome: Progressing

## 2023-11-19 NOTE — Plan of Care (Signed)
  Problem: Clinical Measurements: Goal: Will remain free from infection Outcome: Progressing Goal: Respiratory complications will improve Outcome: Progressing   Problem: Nutrition: Goal: Adequate nutrition will be maintained Outcome: Progressing   Problem: Education: Goal: Verbalization of understanding the information provided (i.e., activity precautions, restrictions, etc) will improve Outcome: Progressing Goal: Individualized Educational Video(s) Outcome: Progressing   Problem: Activity: Goal: Ability to ambulate and perform ADLs will improve Outcome: Progressing   Problem: Pain Management: Goal: Pain level will decrease Outcome: Progressing

## 2023-11-19 NOTE — Progress Notes (Signed)
PT Cancellation Note  Patient Details Name: [name] [name] MRN: [id number] DOB: 1961-07-10   Cancelled Treatment:    Reason Eval/Treat Not Completed: (P) Patient declined, no reason specified (Attempted ~11am however pt had just gotten back to bed from chair. Reattempt 1713 pm, pt eating dinner and defers getting back to chair when encouraged to work on OOB mobility again.) Will continue efforts per PT plan of care as schedule permits.  Dorathy Kinsman Kierstan Auer 11/19/2023, 5:20 PM

## 2023-11-19 NOTE — Progress Notes (Signed)
33 Days Post-Op   Subjective/Chief Complaint: R hip spasms ongoing. Otherwise no complaints. Tolerating diet without n/v or abdominal pain. Passing flatus. Last BM yesterday. Voiding. Scheduled to work with therapies today.   Objective: Vital signs in last 24 hours: Temp:  [97.8 F (36.6 C)-98.3 F (36.8 C)] 98.2 F (36.8 C) (12/23 0738) Pulse Rate:  [87-107] 87 (12/23 0738) Resp:  [16-19] 16 (12/23 0738) BP: (105-116)/(68-82) 116/82 (12/23 0738) SpO2:  [99 %-100 %] 99 % (12/23 0738) Last BM Date : 11/18/23  Intake/Output from previous day: 12/22 0701 - 12/23 0700 In: 120 [P.O.:120] Out: 1050 [Urine:1050] Intake/Output this shift: No intake/output data recorded.  Gen:  Alert, NAD, pleasant Card:  Reg Pulm:  CTAB, no W/R/R, effort normal Abd: Soft, ND, NT, +BS, wound stable from picture 12/16 Ext: R hip hematoma and induration improved from last week. No erythema or heat.  No LE edema. Wiggles digits of b/l LE's. SILT to BLE"s. DP 2+ b/l.  Psych: A&Ox3   Lab Results:  No results for input(s): "WBC", "HGB", "HCT", "PLT" in the last 72 hours. BMET No results for input(s): "NA", "K", "CL", "CO2", "GLUCOSE", "BUN", "CREATININE", "CALCIUM" in the last 72 hours. PT/INR No results for input(s): "LABPROT", "INR" in the last 72 hours. ABG No results for input(s): "PHART", "HCO3" in the last 72 hours.  Invalid input(s): "PCO2", "PO2"  Studies/Results: No results found.  Anti-infectives: Anti-infectives (From admission, onward)    Start     Dose/Rate Route Frequency Ordered Stop   10/24/23 1800  ceFEPIme (MAXIPIME) 2 g in sodium chloride 0.9 % 100 mL IVPB        2 g 200 mL/hr over 30 Minutes Intravenous Every 12 hours 10/24/23 1108 10/26/23 1813   10/21/23 1400  ceFEPIme (MAXIPIME) 2 g in sodium chloride 0.9 % 100 mL IVPB  Status:  Discontinued        2 g 200 mL/hr over 30 Minutes Intravenous Every 8 hours 10/21/23 0844 10/24/23 1108   10/20/23 1900  ceFEPIme (MAXIPIME) 2 g  in sodium chloride 0.9 % 100 mL IVPB  Status:  Discontinued        2 g 200 mL/hr over 30 Minutes Intravenous Every 12 hours 10/20/23 0912 10/21/23 0844   10/19/23 2300  ceFEPIme (MAXIPIME) 2 g in sodium chloride 0.9 % 100 mL IVPB  Status:  Discontinued        2 g 200 mL/hr over 30 Minutes Intravenous Every 8 hours 10/19/23 2157 10/20/23 0912   10/17/23 2200  ceFAZolin (ANCEF) IVPB 2g/100 mL premix        2 g 200 mL/hr over 30 Minutes Intravenous Every 8 hours 10/17/23 1541 10/18/23 1417   10/12/23 1745  ceFEPIme (MAXIPIME) 2 g in sodium chloride 0.9 % 100 mL IVPB  Status:  Discontinued        2 g 200 mL/hr over 30 Minutes Intravenous Every 8 hours 10/12/23 1319 10/14/23 1037   10/11/23 0945  ceFEPIme (MAXIPIME) 2 g in sodium chloride 0.9 % 100 mL IVPB  Status:  Discontinued        2 g 200 mL/hr over 30 Minutes Intravenous Every 12 hours 10/11/23 0846 10/12/23 1319       Assessment/Plan:  PHBC   S/P ex lap, pelvic packing (5 laps), and temporary abdominal closure with vicryl mesh by Dr. Dossie Der 11/12 - return to OR 11/13 for ex lap, removal of packs and Abthera placement by Dr. Janee Morn - extensive mesenteric contusions but bowel viable,  S/P ex lap and closure 11/18 Dr. Bedelia Person. BID WTD midline. On regular diet and having bowel function.  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 Vascular reviewed f/u CTA. F/u 1 year.  R acetabular FX, B superior and inferior rami FXs, R iliac FX, L sacral FX - Dr. Hulda Humphrey consulted, R femur in skeletal traction, S/P SI screw and L acetab perc fixation 11/20 by Dr. Jena Gauss. RLE traction as definitive management for a total of 4 weeks (~12/18).  CT 12/11 showed R hip hematoma. RLE Traction off and WB updated per Dr. Jena Gauss WBAT LLE for transfers. Continue NWB RLE. Repeat xrays done 12/18. Stable for d/c from ortho standpoint. On Vit D.  Pelvic hemorrhage - packed as above, S/P IR embolization by Dr. Elby Showers 11/12 Acute hypoxic respiratory failure -  TRALI, extubated 11/24, guaifenesin for secretions. IS and flutter valve. NT suction as needed. Duoneb prn.  Right gluteal hematoma Right psoas hematoma Right 11th rib fracture Multiple lumbar vertebral body transverse process fractures Right scalp hematoma ABL anemia - Hgb stable on last check.  Tachycardia - Improved  ID - Completed 7 day course for enterobacter and kleb pna. None currently. FEN - Reg diet.  Agitation -  Klonopin 0.5mg  BID for anxiety. Seroquel at HS, PRN Haldol. On Trazodone, Cymbalta, and Hydroxyzine per psych. Psych recommends therapy/psychiatry referral upon DC VTE - SCDs, LMWH. Will need ASA 81 mg daily x 30 days for DVT ppx per Ortho  Foley/urinary retention - Placed 12/6 for retention. Urecholine, UA neg. Flomax, DC foley 12/11. Spont void Dispo - Med-surg.  Therapies. LOG SNF.    I reviewed nursing notes, last 24 h vitals and pain scores, last 48 h intake and output, last 24 h labs and trends, and last 24 h imaging results.   LOS: 42 days    Elmer Sow [facility] 11/19/2023
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
## 2023-11-20 MED ORDER — HYDROCORTISONE 1 % EX CREA
TOPICAL_CREAM | Freq: Two times a day (BID) | CUTANEOUS | Status: DC
Start: 2023-11-20 — End: 2023-12-17
  Administered 2023-11-29 – 2023-12-04 (×2): 1 via TOPICAL
  Filled 2023-11-20 (×2): qty 28

## 2023-11-20 NOTE — Progress Notes (Signed)
Assessment & Plan: PHBC   S/P ex lap, pelvic packing (5 laps), and temporary abdominal closure with vicryl mesh by Dr. Dossie Der 11/12 - return to OR 11/13 for ex lap, removal of packs and Abthera placement by Dr. Janee Morn - extensive mesenteric contusions but bowel viable, S/P ex lap and closure 11/18 Dr. Bedelia Person. BID WTD midline. On regular diet and having bowel function.  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 Vascular reviewed f/u CTA. F/u 1 year.  R acetabular FX, B superior and inferior rami FXs, R iliac FX, L sacral FX  - Dr. Hulda Humphrey consulted, R femur in skeletal traction, S/P SI screw and L acetab perc fixation 11/20 by Dr. Jena Gauss. RLE traction as definitive management for a total of 4 weeks (~12/18).  CT 12/11 showed R hip hematoma. RLE Traction off and WB updated per Dr. Jena Gauss WBAT LLE for transfers. Continue NWB RLE. Repeat xrays done 12/18. Stable for d/c from ortho standpoint. On Vit D.  Pelvic hemorrhage  - packed as above, S/P IR embolization by Dr. Elby Showers 11/12 Acute hypoxic respiratory failure  - TRALI, extubated 11/24, guaifenesin for secretions. IS and flutter valve. NT suction as needed. Duoneb prn.  Right gluteal hematoma Right psoas hematoma Right 11th rib fracture Multiple lumbar vertebral body transverse process fractures Right scalp hematoma ABL anemia  - Hgb stable Tachycardia  - Improved - HR 103 this AM ID  - Completed 7 day course for enterobacter and kleb pna. None currently. FEN  - Reg diet.  Agitation  -  Klonopin 0.5mg  BID for anxiety. Seroquel at HS, PRN Haldol. On Trazodone, Cymbalta, and Hydroxyzine per psych. Psych recommends therapy/psychiatry referral upon DC VTE  - SCDs, LMWH. Will need ASA 81 mg daily x 30 days for DVT ppx per Ortho  Foley/urinary retention  - Placed 12/6 for retention. Urecholine, UA neg. Flomax, DC foley 12/11. Spont void Dispo  - Med-surg.  Therapies. LOG SNF        Darnell Level, MD [facility]  Surgery A [facility] practice Office: [phone]        Chief Complaint: [facility]  Subjective: Patient in bed, mild right hip pain.  Small lesion on pubis, itching.  Objective: Vital signs in last 24 hours: Temp:  [98.1 F (36.7 C)-98.4 F (36.9 C)] 98.3 F (36.8 C) (12/24 0738) Pulse Rate:  [91-103] 103 (12/24 0738) Resp:  [16-20] 16 (12/24 0738) BP: (107-123)/(72-83) 123/72 (12/24 0738) SpO2:  [99 %-100 %] 99 % (12/24 0738) Last BM Date : 11/18/23  Intake/Output from previous day: 12/23 0701 - 12/24 0700 In: 360 [P.O.:360] Out: 1650 [Urine:1650] Intake/Output this shift: No intake/output data recorded.  Physical Exam: HEENT - sclerae clear, mucous membranes moist Neck - soft Abdomen - soft, midline dressing intact - changed this AM GU - small dry firm lesion on right pubis  Lab Results:  No results for input(s): "WBC", "HGB", "HCT", "PLT" in the last 72 hours. BMET No results for input(s): "NA", "K", "CL", "CO2", "GLUCOSE", "BUN", "CREATININE", "CALCIUM" in the last 72 hours. PT/INR No results for input(s): "LABPROT", "INR" in the last 72 hours. Comprehensive Metabolic Panel:    Component Value Date/Time   NA 134 (L) 11/11/2023 0545   NA 131 (L) 11/08/2023 0431   K 4.1 11/11/2023 0545   K 3.8 11/08/2023 0431   CL 93 (L) 11/11/2023 0545   CL 101 11/08/2023 0431   CO2 27 11/11/2023 0545   CO2 24 11/08/2023 0431   BUN 14  11/11/2023 0545   BUN 13 11/08/2023 0431   CREATININE 1.12 11/11/2023 0545   CREATININE 0.87 11/08/2023 0431   GLUCOSE 92 11/11/2023 0545   GLUCOSE 97 11/08/2023 0431   CALCIUM 8.9 11/11/2023 0545   CALCIUM 8.9 11/08/2023 0431   AST 47 (H) 10/25/2023 0520   AST 111 (H) 10/17/2023 0520   ALT 45 (H) 10/25/2023 0520   ALT 60 (H) 10/17/2023 0520   ALKPHOS 127 (H) 10/25/2023 0520   ALKPHOS 86 10/17/2023 0520   BILITOT 2.5 (H) 10/25/2023 0520   BILITOT 2.6 (H) 10/17/2023 0520   PROT 7.1 10/25/2023 0520   PROT 5.6 (L) 10/17/2023 0520    ALBUMIN 2.4 (L) 10/25/2023 0520   ALBUMIN 2.2 (L) 10/17/2023 0520    Studies/Results: No results found.    Darnell Level 11/20/2023  Patient ID: [name] [name], male   DOB: 1961-07-15, 62 y.o.   MRN: [id number]

## 2023-11-20 NOTE — Plan of Care (Signed)
  Problem: Skin Integrity: Goal: Risk for impaired skin integrity will decrease Outcome: Progressing   Problem: Self-Concept: Goal: Ability to maintain and perform role responsibilities to the fullest extent possible will improve Outcome: Progressing   Problem: Pain Management: Goal: Pain level will decrease Outcome: Progressing

## 2023-11-21 NOTE — Progress Notes (Signed)
Assessment & Plan: PHBC   S/P ex lap, pelvic packing (5 laps), and temporary abdominal closure with vicryl mesh by Dr. Dossie Der 11/12 - return to OR 11/13 for ex lap, removal of packs and Abthera placement by Dr. Janee Morn - extensive mesenteric contusions but bowel viable, S/P ex lap and closure 11/18 Dr. Bedelia Person. BID WTD midline. On regular diet and having bowel function.  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 Vascular reviewed f/u CTA. F/u 1 year.  R acetabular FX, B superior and inferior rami FXs, R iliac FX, L sacral FX  - Dr. Hulda Humphrey consulted, R femur in skeletal traction, S/P SI screw and L acetab perc fixation 11/20 by Dr. Jena Gauss. RLE traction as definitive management for a total of 4 weeks (~12/18).  CT 12/11 showed R hip hematoma. RLE Traction off and WB updated per Dr. Jena Gauss WBAT LLE for transfers. Continue NWB RLE. Repeat xrays done 12/18. Stable for d/c from ortho standpoint. On Vit D.  Pelvic hemorrhage  - packed as above, S/P IR embolization by Dr. Elby Showers 11/12 Acute hypoxic respiratory failure  - TRALI, extubated 11/24, guaifenesin for secretions. IS and flutter valve. NT suction as needed. Duoneb prn.  Right gluteal hematoma Right psoas hematoma Right 11th rib fracture Multiple lumbar vertebral body transverse process fractures Right scalp hematoma ABL anemia  - Hgb stable Tachycardia  - Improved - HR 103 this AM ID  - Completed 7 day course for enterobacter and kleb pna. None currently. FEN  - Reg diet.  Agitation  -  Klonopin 0.5mg  BID for anxiety. Seroquel at HS, PRN Haldol. On Trazodone, Cymbalta, and Hydroxyzine per psych. Psych recommends therapy/psychiatry referral upon DC VTE  - SCDs, LMWH. Will need ASA 81 mg daily x 30 days for DVT ppx per Ortho  Foley/urinary retention  - Placed 12/6 for retention. Urecholine, UA neg. Flomax, DC foley 12/11. Spont void Dispo  - Med-surg.  Therapies. Await transfer to SNF        Darnell Level, MD [facility] Surgery A [facility] practice Office: [phone]        Chief Complaint: [facility]  Subjective: Patient in bed, comfortable.  Ate all of breakfast.  Right hip spasms are improved.  Objective: Vital signs in last 24 hours: Temp:  [97.8 F (36.6 C)-98 F (36.7 C)] 98 F (36.7 C) (12/25 0515) Pulse Rate:  [98-100] 98 (12/25 0515) Resp:  [16-20] 18 (12/25 0515) BP: (109-126)/(67-87) 126/87 (12/25 0515) SpO2:  [99 %-100 %] 100 % (12/25 0515) Last BM Date : 11/18/23  Intake/Output from previous day: 12/24 0701 - 12/25 0700 In: -  Out: 900 [Urine:900] Intake/Output this shift: Total I/O In: -  Out: 500 [Urine:500]  Physical Exam: HEENT - sclerae clear, mucous membranes moist Abdomen - soft, dressing intact  Lab Results:  No results for input(s): "WBC", "HGB", "HCT", "PLT" in the last 72 hours. BMET No results for input(s): "NA", "K", "CL", "CO2", "GLUCOSE", "BUN", "CREATININE", "CALCIUM" in the last 72 hours. PT/INR No results for input(s): "LABPROT", "INR" in the last 72 hours. Comprehensive Metabolic Panel:    Component Value Date/Time   NA 134 (L) 11/11/2023 0545   NA 131 (L) 11/08/2023 0431   K 4.1 11/11/2023 0545   K 3.8 11/08/2023 0431   CL 93 (L) 11/11/2023 0545   CL 101 11/08/2023 0431   CO2 27 11/11/2023 0545   CO2 24 11/08/2023 0431   BUN 14 11/11/2023 0545   BUN 13 11/08/2023 0431  CREATININE 1.12 11/11/2023 0545   CREATININE 0.87 11/08/2023 0431   GLUCOSE 92 11/11/2023 0545   GLUCOSE 97 11/08/2023 0431   CALCIUM 8.9 11/11/2023 0545   CALCIUM 8.9 11/08/2023 0431   AST 47 (H) 10/25/2023 0520   AST 111 (H) 10/17/2023 0520   ALT 45 (H) 10/25/2023 0520   ALT 60 (H) 10/17/2023 0520   ALKPHOS 127 (H) 10/25/2023 0520   ALKPHOS 86 10/17/2023 0520   BILITOT 2.5 (H) 10/25/2023 0520   BILITOT 2.6 (H) 10/17/2023 0520   PROT 7.1 10/25/2023 0520   PROT 5.6 (L) 10/17/2023 0520   ALBUMIN 2.4 (L) 10/25/2023 0520   ALBUMIN 2.2 (L) 10/17/2023 0520     Studies/Results: No results found.    Darnell Level 11/21/2023  Patient ID: [name] [name], male   DOB: January 27, 1961, 62 y.o.   MRN: [id number]

## 2023-11-21 NOTE — Plan of Care (Signed)
  Problem: Nutrition: Goal: Adequate nutrition will be maintained Outcome: Progressing   Problem: Skin Integrity: Goal: Risk for impaired skin integrity will decrease Outcome: Progressing   Problem: Pain Management: Goal: Pain level will decrease Outcome: Progressing

## 2023-11-21 NOTE — TOC CM/SW Note (Addendum)
Transition of Care [facility]) - Inpatient Brief Assessment   Patient Details  Name: [name] [name] MRN: [id number] Date of Birth: [birth year]  Transition of Care [facility]) CM/SW Contact:    Marliss Coots, LCSW Phone Number: 11/21/2023, 11:34 AM   Clinical Narrative:  11:34 AM CSW called Charlcie Cradle with [facility] to follow up on potential bed offer. Charlcie Cradle informed CSW that he would contact central office regarding patient's payor. Per chart review, patient is not established with a primary care provider. TOC will continue to follow.  11:43 AM CSW attempted to call patient's number on file but it was not in service. CSW called patient's room regarding TOC consult (Substance Use) and SDOH needs. Patient expressed interest in Substance Use and Housing resources within [facility]. Patient declined shelter resources and is currently renting a room in a home in [place].  12:38 PM CSW provided requested resources ([place] Substance Use Packet, [place] [facility] [employer], [place] [facility] contact information). Patient confirmed that he no longer has personal cell phone. Patient denied other SDOH resources and current IPV.   Transition of Care Asessment: Insurance and Status: Insurance coverage has been reviewed Patient has primary care physician: No Home environment has been reviewed: Private Residence Prior level of function:: Independent/Modified Audiological scientist Home Services: No current home services Social Drivers of Health Review:  (Patient unable to answer) Readmission risk has been reviewed: Yes Transition of care needs: transition of care needs identified, TOC will continue to follow

## 2023-11-22 MED ORDER — INFLUENZA VAC A&B SURF ANT ADJ 0.5 ML IM SUSY
0.5000 mL | PREFILLED_SYRINGE | INTRAMUSCULAR | Status: AC
  Administered 2023-11-24: 0.5 mL via INTRAMUSCULAR
  Filled 2023-11-22: qty 0.5

## 2023-11-22 MED ORDER — IBUPROFEN 600 MG PO TABS
600.0000 mg | ORAL_TABLET | Freq: Four times a day (QID) | ORAL | Status: DC | PRN
  Administered 2023-11-24 – 2023-11-29 (×2): 600 mg via ORAL
  Filled 2023-11-22 (×2): qty 1

## 2023-11-22 MED ORDER — IBUPROFEN 100 MG/5ML PO SUSP
600.0000 mg | Freq: Four times a day (QID) | ORAL | Status: DC | PRN
  Filled 2023-11-22: qty 30
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
## 2023-11-22 NOTE — Progress Notes (Signed)
Assessment & Plan: PHBC   S/P ex lap, pelvic packing (5 laps), and temporary abdominal closure with vicryl mesh by Dr. Dossie Der 11/12 - return to OR 11/13 for ex lap, removal of packs and Abthera placement by Dr. Janee Morn - extensive mesenteric contusions but bowel viable, S/P ex lap and closure 11/18 Dr. Bedelia Person. BID WTD midline. On regular diet and having bowel function.  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 Vascular reviewed f/u CTA. F/u 1 year.  R acetabular FX, B superior and inferior rami FXs, R iliac FX, L sacral FX  - Dr. Hulda Humphrey consulted, R femur in skeletal traction, S/P SI screw and L acetab perc fixation 11/20 by Dr. Jena Gauss. RLE traction as definitive management for a total of 4 weeks (~12/18).  CT 12/11 showed R hip hematoma. RLE Traction off and WB updated per Dr. Jena Gauss WBAT LLE for transfers. Continue NWB RLE. Repeat xrays done 12/18. Stable for d/c from ortho standpoint. On Vit D.  Pelvic hemorrhage  - packed as above, S/P IR embolization by Dr. Elby Showers 11/12 Acute hypoxic respiratory failure  - TRALI, extubated 11/24, guaifenesin for secretions. IS and flutter valve. NT suction as needed. Duoneb prn.  Right gluteal hematoma Right psoas hematoma Right 11th rib fracture Multiple lumbar vertebral body transverse process fractures Right scalp hematoma ABL anemia  - Hgb stable Tachycardia  - Improved - HR 103 this AM ID  - Completed 7 day course for enterobacter and kleb pna. None currently. FEN  - Reg diet.  Agitation  -  Klonopin 0.5mg  BID for anxiety. Seroquel at HS, PRN Haldol. On Trazodone, Cymbalta, and Hydroxyzine per psych. Psych recommends therapy/psychiatry referral upon DC VTE  - SCDs, LMWH. Will need ASA 81 mg daily x 30 days for DVT ppx per Ortho  Foley/urinary retention  - Placed 12/6 for retention. Urecholine, UA neg. Flomax, DC foley 12/11. Spont void Dispo  - Med-surg.  Therapies. Await transfer to SNF - note from [name] CM/SW from  yesterday reviewed.        Darnell Level, MD [facility] practice Office: [phone]        Chief Complaint: [name] [name]  Subjective: Patient in bed, nurse at bedside.  Dressing changed this AM. Complains of right hip "spasms".  Objective: Vital signs in last 24 hours: Temp:  [97.6 F (36.4 C)-98.8 F (37.1 C)] 98.2 F (36.8 C) (12/26 0735) Pulse Rate:  [94-98] 98 (12/26 0514) Resp:  [17-20] 17 (12/26 0735) BP: (104-128)/(71-83) 118/83 (12/26 0735) SpO2:  [100 %] 100 % (12/26 0735) Last BM Date : 11/21/23  Intake/Output from previous day: 12/25 0701 - 12/26 0700 In: 1200 [P.O.:1200] Out: 2650 [Urine:2650] Intake/Output this shift: Total I/O In: -  Out: 350 [Urine:350]  Physical Exam: HEENT - sclerae clear, mucous membranes moist Neck - soft Abdomen - soft without distension; dressing dry and intact Ext - no edema, non-tender; right hip mild tender, no STS, no hematoma  Lab Results:  No results for input(s): "WBC", "HGB", "HCT", "PLT" in the last 72 hours. BMET No results for input(s): "NA", "K", "CL", "CO2", "GLUCOSE", "BUN", "CREATININE", "CALCIUM" in the last 72 hours. PT/INR No results for input(s): "LABPROT", "INR" in the last 72 hours. Comprehensive Metabolic Panel:    Component Value Date/Time   NA 134 (L) 11/11/2023 0545   NA 131 (L) 11/08/2023 0431   K 4.1 11/11/2023 0545   K 3.8 11/08/2023 0431   CL 93 (L) 11/11/2023 0545   CL  101 11/08/2023 0431   CO2 27 11/11/2023 0545   CO2 24 11/08/2023 0431   BUN 14 11/11/2023 0545   BUN 13 11/08/2023 0431   CREATININE 1.12 11/11/2023 0545   CREATININE 0.87 11/08/2023 0431   GLUCOSE 92 11/11/2023 0545   GLUCOSE 97 11/08/2023 0431   CALCIUM 8.9 11/11/2023 0545   CALCIUM 8.9 11/08/2023 0431   AST 47 (H) 10/25/2023 0520   AST 111 (H) 10/17/2023 0520   ALT 45 (H) 10/25/2023 0520   ALT 60 (H) 10/17/2023 0520   ALKPHOS 127 (H) 10/25/2023 0520   ALKPHOS 86 10/17/2023 0520   BILITOT 2.5  (H) 10/25/2023 0520   BILITOT 2.6 (H) 10/17/2023 0520   PROT 7.1 10/25/2023 0520   PROT 5.6 (L) 10/17/2023 0520   ALBUMIN 2.4 (L) 10/25/2023 0520   ALBUMIN 2.2 (L) 10/17/2023 0520    Studies/Results: No results found.    Darnell Level 11/22/2023  Patient ID: [name] [name], male   DOB: 1961-07-06, 62 y.o.   MRN: [id number]
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
## 2023-11-23 MED ORDER — GUAIFENESIN 100 MG/5ML PO LIQD: 10.0000 mL | Freq: Four times a day (QID) | ORAL | Status: DC | PRN
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
## 2023-11-23 NOTE — Plan of Care (Signed)
  Problem: Clinical Measurements: Goal: Respiratory complications will improve Outcome: Progressing   Problem: Nutrition: Goal: Adequate nutrition will be maintained Outcome: Progressing   Problem: Skin Integrity: Goal: Risk for impaired skin integrity will decrease Outcome: Progressing   Problem: Activity: Goal: Ability to ambulate and perform ADLs will improve Outcome: Progressing   Problem: Clinical Measurements: Goal: Postoperative complications will be avoided or minimized Outcome: Progressing   Problem: Pain Management: Goal: Pain level will decrease Outcome: Progressing

## 2023-11-23 NOTE — Progress Notes (Signed)
37 Days Post-Op   Subjective/Chief Complaint: Continues to have R hip spasms, but feels it is a littler better today. No other acute issues.  Objective: Vital signs in last 24 hours: Temp:  [98 F (36.7 C)-98.3 F (36.8 C)] 98.2 F (36.8 C) (12/27 0724) Pulse Rate:  [92-105] 92 (12/27 0724) Resp:  [16-18] 16 (12/27 0724) BP: (107-116)/(72-80) 116/79 (12/27 0724) SpO2:  [97 %-100 %] 97 % (12/27 0724) Weight:  [60.1 kg] 60.1 kg (12/26 1453) Last BM Date : 11/21/23  Intake/Output from previous day: 12/26 0701 - 12/27 0700 In: 1200 [P.O.:1200] Out: 2025 [Urine:2025] Intake/Output this shift: No intake/output data recorded.  Gen:  Alert, NAD, pleasant Pulm: Nonlabored respirations Abd: soft, nondistended, midline wound is closed inferiorly, skin remains open superiorly with in tact fascia, small amount of fibrinous tissue in wound base. Ext: warm and well-perfused Psych: A&Ox3   Lab Results:  No results for input(s): "WBC", "HGB", "HCT", "PLT" in the last 72 hours. BMET No results for input(s): "NA", "K", "CL", "CO2", "GLUCOSE", "BUN", "CREATININE", "CALCIUM" in the last 72 hours. PT/INR No results for input(s): "LABPROT", "INR" in the last 72 hours. ABG No results for input(s): "PHART", "HCO3" in the last 72 hours.  Invalid input(s): "PCO2", "PO2"  Studies/Results: No results found.   Assessment/Plan:  PHBC   S/P ex lap, pelvic packing (5 laps), and temporary abdominal closure with vicryl mesh by Dr. Dossie Der 11/12 - return to OR 11/13 for ex lap, removal of packs and Abthera placement by Dr. Janee Morn - extensive mesenteric contusions but bowel viable, S/P ex lap and closure 11/18 Dr. Bedelia Person. BID saline WTD to upper portion of midline wound. On regular diet and having bowel function.  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 Vascular reviewed f/u CTA. F/u 1 year.  R acetabular FX, B superior and inferior rami FXs, R iliac FX, L sacral FX - Dr. Hulda Humphrey  consulted, R femur in skeletal traction, S/P SI screw and L acetab perc fixation 11/20 by Dr. Jena Gauss. RLE traction as definitive management for a total of 4 weeks (~12/18).  CT 12/11 showed R hip hematoma. RLE Traction off and WB updated per Dr. Jena Gauss WBAT LLE for transfers. Continue NWB RLE. Repeat xrays done 12/18. Stable for d/c from ortho standpoint. On Vit D.  Pelvic hemorrhage - resolved, S/P IR embolization by Dr. Elby Showers 11/12 Acute hypoxic respiratory failure - TRALI, extubated 11/24, guaifenesin prn for secretions. IS and flutter valve. NT suction as needed. Duoneb prn.  Right gluteal hematoma Right psoas hematoma Right 11th rib fracture Multiple lumbar vertebral body transverse process fractures Right scalp hematoma ABL anemia - Hgb stable on last check.  Tachycardia - Resolved, HR 90s today ID - Completed 7 day course for enterobacter and kleb pna. None currently. FEN - Reg diet. Prosource BID. Agitation -  Klonopin 0.5mg  BID for anxiety. Seroquel at HS. On Trazodone, Cymbalta, and Hydroxyzine per psych. Psych recommends therapy/psychiatry referral upon DC VTE - SCDs, LMWH. ASA 81 mg daily x 30 days for DVT ppx per Ortho  Dispo - Med-surg.  Therapies. LOG SNF.    I reviewed nursing notes, last 24 h vitals and pain scores, last 48 h intake and output, last 24 h labs and trends, and last 24 h imaging results.   LOS: 46 days    [name] [name] 11/23/2023

## 2023-11-23 NOTE — Plan of Care (Signed)
  Problem: Clinical Measurements: Goal: Will remain free from infection Outcome: Progressing Goal: Respiratory complications will improve Outcome: Progressing   Problem: Nutrition: Goal: Adequate nutrition will be maintained Outcome: Progressing   Problem: Skin Integrity: Goal: Risk for impaired skin integrity will decrease Outcome: Progressing   Problem: Education: Goal: Verbalization of understanding the information provided (i.e., activity precautions, restrictions, etc) will improve Outcome: Progressing Goal: Individualized Educational Video(s) Outcome: Progressing   Problem: Activity: Goal: Ability to ambulate and perform ADLs will improve Outcome: Progressing   Problem: Clinical Measurements: Goal: Postoperative complications will be avoided or minimized Outcome: Progressing   Problem: Self-Concept: Goal: Ability to maintain and perform role responsibilities to the fullest extent possible will improve Outcome: Progressing   Problem: Pain Management: Goal: Pain level will decrease Outcome: Progressing

## 2023-11-23 NOTE — TOC Progression Note (Signed)
Transition of Care [facility]) - Progression Note    Patient Details  Name: Tomislav Mare MRN: [id number] Date of Birth: 1961-10-07  Transition of Care [facility]) CM/SW Contact  Glennon Mac, RN Phone Number: 11/23/2023, 4:26 PM  Clinical Narrative:    TOC still awaiting official word from [facility] regarding consideration of LOG SNF.  Patient progressing well with therapies, despite weight bearing restrictions. Will continue to follow; will reach out to [facility] on Monday.     Expected Discharge Plan: Skilled Nursing Facility Barriers to Discharge: Continued Medical Work up  Expected Discharge Plan and Services   Discharge Planning Services: CM Consult                                           Social Determinants of Health (SDOH) Interventions SDOH Screenings   Food Insecurity: Patient Unable To Answer (10/18/2023)  Housing: Patient Unable To Answer (10/18/2023)  Transportation Needs: Patient Unable To Answer (10/18/2023)  Utilities: Patient Unable To Answer (10/18/2023)  Tobacco Use: Low Risk  (11/18/2023)    Readmission Risk Interventions     No data to display         Quintella Baton, RN, BSN  Trauma/Neuro ICU Case Manager [phone]

## 2023-11-24 MED ORDER — BISACODYL 10 MG RE SUPP: 10.0000 mg | Freq: Every day | RECTAL | Status: DC | PRN

## 2023-11-24 MED ORDER — CLONAZEPAM 0.25 MG PO TBDP
0.2500 mg | ORAL_TABLET | Freq: Two times a day (BID) | ORAL | Status: DC
  Administered 2023-11-24 – 2023-12-17 (×47): 0.25 mg via ORAL
  Filled 2023-11-24 (×47): qty 1

## 2023-11-24 NOTE — Plan of Care (Signed)
  Problem: Nutrition: Goal: Adequate nutrition will be maintained Outcome: Progressing   Problem: Skin Integrity: Goal: Risk for impaired skin integrity will decrease Outcome: Progressing   Problem: Activity: Goal: Ability to ambulate and perform ADLs will improve Outcome: Progressing   Problem: Pain Management: Goal: Pain level will decrease Outcome: Progressing

## 2023-11-24 NOTE — Progress Notes (Signed)
38 Days Post-Op   Subjective/Chief Complaint: No acute changes. Hip spasms improving.  Objective: Vital signs in last 24 hours: Temp:  [97.5 F (36.4 C)-98.1 F (36.7 C)] 98.1 F (36.7 C) (12/28 0317) Pulse Rate:  [96-97] 97 (12/28 0317) Resp:  [16-18] 18 (12/28 0317) BP: (95-124)/(75-90) 124/90 (12/28 0317) SpO2:  [99 %-100 %] 100 % (12/28 0317) Last BM Date : 11/23/23  Intake/Output from previous day: 12/27 0701 - 12/28 0700 In: 240 [P.O.:240] Out: 1850 [Urine:1850] Intake/Output this shift: Total I/O In: 240 [P.O.:240] Out: 400 [Urine:400]  Gen:  Alert, NAD, pleasant Pulm: Nonlabored respirations Abd: soft, nondistended, midline wound is closed inferiorly, skin remains open superiorly with in tact fascia, small amount of fibrinous tissue in wound base. Ext: warm and well-perfused Psych: A&Ox3   Lab Results:  No results for input(s): "WBC", "HGB", "HCT", "PLT" in the last 72 hours. BMET No results for input(s): "NA", "K", "CL", "CO2", "GLUCOSE", "BUN", "CREATININE", "CALCIUM" in the last 72 hours. PT/INR No results for input(s): "LABPROT", "INR" in the last 72 hours. ABG No results for input(s): "PHART", "HCO3" in the last 72 hours.  Invalid input(s): "PCO2", "PO2"  Studies/Results: No results found.   Assessment/Plan:  PHBC   S/P ex lap, pelvic packing (5 laps), and temporary abdominal closure with vicryl mesh by Dr. Dossie Der 11/12 - return to OR 11/13 for ex lap, removal of packs and Abthera placement by Dr. Janee Morn - extensive mesenteric contusions but bowel viable, S/P ex lap and closure 11/18 Dr. Bedelia Person. BID saline WTD to upper portion of midline wound. On regular diet and having bowel function.  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 Vascular reviewed f/u CTA. F/u 1 year.  R acetabular FX, B superior and inferior rami FXs, R iliac FX, L sacral FX - Dr. Hulda Humphrey consulted, R femur in skeletal traction, S/P SI screw and L acetab perc fixation  11/20 by Dr. Jena Gauss. RLE traction as definitive management for a total of 4 weeks (~12/18).  CT 12/11 showed R hip hematoma. RLE Traction off and WB updated per Dr. Jena Gauss WBAT LLE for transfers. Continue NWB RLE. Repeat xrays done 12/18. Stable for d/c from ortho standpoint. On Vit D.  Pelvic hemorrhage - resolved, S/P IR embolization by Dr. Elby Showers 11/12 Acute hypoxic respiratory failure - TRALI, extubated 11/24, guaifenesin prn for secretions. IS and flutter valve. NT suction as needed. Duoneb prn.  Right gluteal hematoma Right psoas hematoma Right 11th rib fracture Multiple lumbar vertebral body transverse process fractures Right scalp hematoma ABL anemia - Hgb stable on last check.  Tachycardia - Resolved, HR 90s today ID - Completed 7 day course for enterobacter and kleb pna. None currently. FEN - Reg diet. Prosource BID. Agitation -  Has been on Klonopin 0.5mg  BID for anxiety for several weeks, wean to 0.25mg  BID today. Continue Seroquel at bedtime, trazodone and Cymbalta per psych. Psych recommends therapy/psychiatry referral upon DC. VTE - SCDs, LMWH. ASA 81 mg daily x 30 days for DVT ppx per Ortho  Dispo - Med-surg.  Therapies. LOG SNF.    I reviewed nursing notes, last 24 h vitals and pain scores, last 48 h intake and output, last 24 h labs and trends, and last 24 h imaging results.   LOS: 47 days    [name] [name] 11/24/2023

## 2023-11-25 MED ORDER — METHOCARBAMOL 500 MG PO TABS
500.0000 mg | ORAL_TABLET | Freq: Four times a day (QID) | ORAL | Status: DC | PRN
  Administered 2023-11-25 – 2023-11-30 (×13): 500 mg via ORAL
  Filled 2023-11-25 (×13): qty 1

## 2023-11-25 MED ORDER — BISACODYL 10 MG RE SUPP
10.0000 mg | Freq: Once | RECTAL | Status: AC
  Administered 2023-11-25: 10 mg via RECTAL
  Filled 2023-11-25: qty 1

## 2023-11-25 NOTE — Plan of Care (Signed)
  Problem: Clinical Measurements: Goal: Will remain free from infection Outcome: Progressing Goal: Respiratory complications will improve Outcome: Progressing   Problem: Nutrition: Goal: Adequate nutrition will be maintained Outcome: Progressing   Problem: Skin Integrity: Goal: Risk for impaired skin integrity will decrease Outcome: Progressing   Problem: Education: Goal: Verbalization of understanding the information provided (i.e., activity precautions, restrictions, etc) will improve Outcome: Progressing Goal: Individualized Educational Video(s) Outcome: Progressing   Problem: Activity: Goal: Ability to ambulate and perform ADLs will improve Outcome: Progressing   Problem: Clinical Measurements: Goal: Postoperative complications will be avoided or minimized Outcome: Progressing   Problem: Self-Concept: Goal: Ability to maintain and perform role responsibilities to the fullest extent possible will improve Outcome: Progressing   Problem: Pain Management: Goal: Pain level will decrease Outcome: Progressing

## 2023-11-25 NOTE — Progress Notes (Signed)
39 Days Post-Op   Subjective/Chief Complaint: Intermittent muscle spasms, previous robaxin order expired. No other changes.  Objective: Vital signs in last 24 hours: Temp:  [97.8 F (36.6 C)-98.5 F (36.9 C)] 98.3 F (36.8 C) (12/29 0742) Pulse Rate:  [90-100] 98 (12/29 0742) Resp:  [16-24] 16 (12/29 0742) BP: (121-129)/(85-88) 129/88 (12/29 0742) SpO2:  [96 %-100 %] 96 % (12/29 0742) Last BM Date : 11/24/23  Intake/Output from previous day: 12/28 0701 - 12/29 0700 In: 600 [P.O.:600] Out: 1825 [Urine:1825] Intake/Output this shift: No intake/output data recorded.  Gen:  Alert, NAD, pleasant Pulm: Nonlabored respirations Abd: soft, nondistended, midline wound is closed inferiorly, skin remains open superiorly with in tact fascia, granulating. Ext: warm and well-perfused. Ulcer on right heel, no skin breakdown. Psych: A&Ox3   Lab Results:  No results for input(s): "WBC", "HGB", "HCT", "PLT" in the last 72 hours. BMET No results for input(s): "NA", "K", "CL", "CO2", "GLUCOSE", "BUN", "CREATININE", "CALCIUM" in the last 72 hours. PT/INR No results for input(s): "LABPROT", "INR" in the last 72 hours. ABG No results for input(s): "PHART", "HCO3" in the last 72 hours.  Invalid input(s): "PCO2", "PO2"  Studies/Results: No results found.   Assessment/Plan:  PHBC   S/P ex lap, pelvic packing (5 laps), and temporary abdominal closure with vicryl mesh by Dr. Dossie Der 11/12 - return to OR 11/13 for ex lap, removal of packs and Abthera placement by Dr. Janee Morn - extensive mesenteric contusions but bowel viable, S/P ex lap and closure 11/18 Dr. Bedelia Person. BID saline WTD to upper portion of midline wound. On regular diet and having bowel function.  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 Vascular reviewed f/u CTA. F/u 1 year.  R acetabular FX, B superior and inferior rami FXs, R iliac FX, L sacral FX - Dr. Hulda Humphrey consulted, R femur in skeletal traction, S/P SI screw and L  acetab perc fixation 11/20 by Dr. Jena Gauss. RLE traction as definitive management for a total of 4 weeks (~12/18).  CT 12/11 showed R hip hematoma. RLE Traction off and WB updated per Dr. Jena Gauss WBAT LLE for transfers. Continue NWB RLE. Repeat xrays done 12/18. Stable for d/c from ortho standpoint. On Vit D.  Pelvic hemorrhage - resolved, S/P IR embolization by Dr. Elby Showers 11/12 Acute hypoxic respiratory failure - TRALI, extubated 11/24, guaifenesin prn for secretions. IS and flutter valve. NT suction as needed. Duoneb prn.  Right gluteal hematoma Right psoas hematoma Right 11th rib fracture Multiple lumbar vertebral body transverse process fractures Right scalp hematoma R heel ulcer, appears to be a stage I pressure ulcer - Prevalon boot to right foot while in bed, mepilex dressing. ID - Completed 7 day course for enterobacter and kleb pna. None currently. FEN - Reg diet. Prosource BID. Agitation -  Klonopin 0.25mg  BID for anxiety. Seroquel at HS. On Trazodone, Cymbalta, and Hydroxyzine per psych. Psych recommends therapy/psychiatry referral upon DC VTE - SCDs, LMWH. ASA 81 mg daily x 30 days for DVT ppx per Ortho  Dispo - Med-surg.  Therapies. LOG SNF.    I reviewed nursing notes, last 24 h vitals and pain scores, last 48 h intake and output, last 24 h labs and trends, and last 24 h imaging results.   LOS: 48 days    Fritzi Mandes 11/25/2023

## 2023-11-25 NOTE — Plan of Care (Signed)
°  Problem: Nutrition: Goal: Adequate nutrition will be maintained Outcome: Progressing   Problem: Skin Integrity: Goal: Risk for impaired skin integrity will decrease Outcome: Progressing   Problem: Activity: Goal: Ability to ambulate and perform ADLs will improve Outcome: Progressing   Problem: Pain Management: Goal: Pain level will decrease Outcome: Progressing   Problem: Self-Concept: Goal: Ability to maintain and perform role responsibilities to the fullest extent possible will improve Outcome: Progressing

## 2023-11-26 NOTE — Progress Notes (Signed)
40 Days Post-Op   Subjective/Chief Complaint: Still some mm spasms, in chair yesterday, tol diet, had bm with enema, voiding   Objective: Vital signs in last 24 hours: Temp:  [97.8 F (36.6 C)-98.6 F (37 C)] 98.6 F (37 C) (12/30 0738) Pulse Rate:  [58-102] 90 (12/30 0738) Resp:  [16-20] 16 (12/30 0738) BP: (101-127)/(68-84) 105/70 (12/30 0738) SpO2:  [94 %-100 %] 99 % (12/30 0738) Last BM Date : 11/25/23  Intake/Output from previous day: 12/29 0701 - 12/30 0700 In: -  Out: 400 [Urine:400] Intake/Output this shift: No intake/output data recorded.  Gen:  Alert, NAD Pulm: effort normal Abd: soft, nondistended, midline wound is closed inferiorly, skin remains open superiorly with fascia intact Ext: warm and well-perfused.  Psych: A&Ox3   Lab Results:  No results for input(s): "WBC", "HGB", "HCT", "PLT" in the last 72 hours. BMET No results for input(s): "NA", "K", "CL", "CO2", "GLUCOSE", "BUN", "CREATININE", "CALCIUM" in the last 72 hours. PT/INR No results for input(s): "LABPROT", "INR" in the last 72 hours. ABG No results for input(s): "PHART", "HCO3" in the last 72 hours.  Invalid input(s): "PCO2", "PO2"  Studies/Results: No results found.  Anti-infectives: Anti-infectives (From admission, onward)    Start     Dose/Rate Route Frequency Ordered Stop   10/24/23 1800  ceFEPIme (MAXIPIME) 2 g in sodium chloride 0.9 % 100 mL IVPB        2 g 200 mL/hr over 30 Minutes Intravenous Every 12 hours 10/24/23 1108 10/26/23 1813   10/21/23 1400  ceFEPIme (MAXIPIME) 2 g in sodium chloride 0.9 % 100 mL IVPB  Status:  Discontinued        2 g 200 mL/hr over 30 Minutes Intravenous Every 8 hours 10/21/23 0844 10/24/23 1108   10/20/23 1900  ceFEPIme (MAXIPIME) 2 g in sodium chloride 0.9 % 100 mL IVPB  Status:  Discontinued        2 g 200 mL/hr over 30 Minutes Intravenous Every 12 hours 10/20/23 0912 10/21/23 0844   10/19/23 2300  ceFEPIme (MAXIPIME) 2 g in sodium chloride 0.9 %  100 mL IVPB  Status:  Discontinued        2 g 200 mL/hr over 30 Minutes Intravenous Every 8 hours 10/19/23 2157 10/20/23 0912   10/17/23 2200  ceFAZolin (ANCEF) IVPB 2g/100 mL premix        2 g 200 mL/hr over 30 Minutes Intravenous Every 8 hours 10/17/23 1541 10/18/23 1417   10/12/23 1745  ceFEPIme (MAXIPIME) 2 g in sodium chloride 0.9 % 100 mL IVPB  Status:  Discontinued        2 g 200 mL/hr over 30 Minutes Intravenous Every 8 hours 10/12/23 1319 10/14/23 1037   10/11/23 0945  ceFEPIme (MAXIPIME) 2 g in sodium chloride 0.9 % 100 mL IVPB  Status:  Discontinued        2 g 200 mL/hr over 30 Minutes Intravenous Every 12 hours 10/11/23 0846 10/12/23 1319       Assessment/Plan: PHBC   S/P ex lap, pelvic packing (5 laps), and temporary abdominal closure with vicryl mesh by Dr. Dossie Der 11/12 - return to OR 11/13 for ex lap, removal of packs and Abthera placement by Dr. Janee Morn S/P ex lap and closure 11/18 Dr. Bedelia Person. BID saline WTD to upper portion of midline wound. On regular diet and having bowel function.  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 Vascular reviewed f/u CTA. F/u 1 year.  R acetabular FX, B superior and inferior  rami FXs, R iliac FX, L sacral FX - Dr. Hulda Humphrey consulted, R femur in skeletal traction, S/P SI screw and L acetab perc fixation 11/20 by Dr. Jena Gauss. RLE traction as definitive management for a total of 4 weeks (~12/18).  CT 12/11 showed R hip hematoma. RLE Traction off and WB updated per Dr. Jena Gauss WBAT LLE for transfers. Continue NWB RLE. Repeat xrays done 12/18. Stable for d/c from ortho standpoint. On Vit D.  Pelvic hemorrhage - resolved, S/P IR embolization by Dr. Elby Showers 11/12 Acute hypoxic respiratory failure - TRALI, extubated 11/24, guaifenesin prn for secretions. IS and flutter valve.  Right gluteal hematoma Right psoas hematoma Right 11th rib fracture Multiple lumbar vertebral body transverse process fractures Right scalp hematoma R heel ulcer,  appears to be a stage I pressure ulcer - Prevalon boot to right foot while in bed, mepilex dressing. ID - Completed 7 day course for enterobacter and kleb pna. None currently. FEN - Reg diet. Prosource BID. Agitation -  Klonopin 0.25mg  BID for anxiety. Seroquel at HS. On Trazodone, Cymbalta, and Hydroxyzine per psych. Psych recommends therapy/psychiatry referral upon DC VTE - SCDs, LMWH. ASA 81 mg daily x 30 days for DVT ppx per Ortho  Dispo - Med-surg.  Therapies. LOG SNF.    Emelia Loron 11/26/2023
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
## 2023-11-26 NOTE — Plan of Care (Signed)
  Problem: Clinical Measurements: Goal: Will remain free from infection Outcome: Progressing Goal: Respiratory complications will improve Outcome: Progressing   Problem: Nutrition: Goal: Adequate nutrition will be maintained Outcome: Progressing   Problem: Skin Integrity: Goal: Risk for impaired skin integrity will decrease Outcome: Progressing   Problem: Education: Goal: Verbalization of understanding the information provided (i.e., activity precautions, restrictions, etc) will improve Outcome: Progressing Goal: Individualized Educational Video(s) Outcome: Progressing   Problem: Activity: Goal: Ability to ambulate and perform ADLs will improve Outcome: Progressing   Problem: Clinical Measurements: Goal: Postoperative complications will be avoided or minimized Outcome: Progressing   Problem: Self-Concept: Goal: Ability to maintain and perform role responsibilities to the fullest extent possible will improve Outcome: Progressing   Problem: Pain Management: Goal: Pain level will decrease Outcome: Progressing

## 2023-11-27 MED ORDER — LIDOCAINE 5 % EX PTCH
1.0000 | MEDICATED_PATCH | CUTANEOUS | Status: DC
  Administered 2023-11-27 – 2023-11-28 (×2): 1 via TRANSDERMAL
  Filled 2023-11-27 (×2): qty 1

## 2023-11-27 NOTE — Progress Notes (Signed)
 41 Days Post-Op   Subjective/Chief Complaint: Mm spasms right hip and buttock otherwise doing ok    Objective: Vital signs in last 24 hours: Temp:  [97.4 F (36.3 C)-98.5 F (36.9 C)] 98.1 F (36.7 C) (12/31 0818) Pulse Rate:  [83-95] 83 (12/31 0818) Resp:  [16-20] 18 (12/31 0818) BP: (114-132)/(81-86) 132/86 (12/31 0818) SpO2:  [100 %] 100 % (12/31 0818) Last BM Date : 11/25/23  Intake/Output from previous day: 12/30 0701 - 12/31 0700 In: 340 [P.O.:340] Out: 1625 [Urine:1625] Intake/Output this shift: Total I/O In: -  Out: 250 [Urine:250]  Gen:  Alert, NAD Pulm: effort normal Abd: soft, nondistended, midline wound is closed inferiorly, skin remains open superiorly with fascia intact Ext: warm and well-perfused.  Psych: A&Ox3  Studies/Results: No results found.  Anti-infectives: Anti-infectives (From admission, onward)    Start     Dose/Rate Route Frequency Ordered Stop   10/24/23 1800  ceFEPIme  (MAXIPIME ) 2 g in sodium chloride  0.9 % 100 mL IVPB        2 g 200 mL/hr over 30 Minutes Intravenous Every 12 hours 10/24/23 1108 10/26/23 1813   10/21/23 1400  ceFEPIme  (MAXIPIME ) 2 g in sodium chloride  0.9 % 100 mL IVPB  Status:  Discontinued        2 g 200 mL/hr over 30 Minutes Intravenous Every 8 hours 10/21/23 0844 10/24/23 1108   10/20/23 1900  ceFEPIme  (MAXIPIME ) 2 g in sodium chloride  0.9 % 100 mL IVPB  Status:  Discontinued        2 g 200 mL/hr over 30 Minutes Intravenous Every 12 hours 10/20/23 0912 10/21/23 0844   10/19/23 2300  ceFEPIme  (MAXIPIME ) 2 g in sodium chloride  0.9 % 100 mL IVPB  Status:  Discontinued        2 g 200 mL/hr over 30 Minutes Intravenous Every 8 hours 10/19/23 2157 10/20/23 0912   10/17/23 2200  ceFAZolin  (ANCEF ) IVPB 2g/100 mL premix        2 g 200 mL/hr over 30 Minutes Intravenous Every 8 hours 10/17/23 1541 10/18/23 1417   10/12/23 1745  ceFEPIme  (MAXIPIME ) 2 g in sodium chloride  0.9 % 100 mL IVPB  Status:  Discontinued        2  g 200 mL/hr over 30 Minutes Intravenous Every 8 hours 10/12/23 1319 10/14/23 1037   10/11/23 0945  ceFEPIme  (MAXIPIME ) 2 g in sodium chloride  0.9 % 100 mL IVPB  Status:  Discontinued        2 g 200 mL/hr over 30 Minutes Intravenous Every 12 hours 10/11/23 0846 10/12/23 1319       Assessment/Plan: PHBC   S/P ex lap, pelvic packing (5 laps), and temporary abdominal closure with vicryl mesh by Dr. Lyndel 11/12 - return to OR 11/13 for ex lap, removal of packs and Abthera placement by Dr. Sebastian S/P ex lap and closure 11/18 Dr. Paola. BID saline WTD to upper portion of midline wound. On regular diet and having bowel function.  Aortic transsection - Dr. 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 Vascular reviewed f/u CTA. F/u 1 year.  R acetabular FX, B superior and inferior rami FXs, R iliac FX, L sacral FX - Dr. Sherida consulted, R femur in skeletal traction, S/P SI screw and L acetab perc fixation 11/20 by Dr. Kendal. RLE traction as definitive management for a total of 4 weeks (~12/18).  CT 12/11 showed R hip hematoma. RLE Traction off and WB updated per Dr. Kendal WBAT LLE for transfers. Continue NWB RLE. Repeat xrays  done 12/18. Stable for d/c from ortho standpoint. On Vit D.  Pelvic hemorrhage - resolved, S/P IR embolization by Dr. Jennefer 11/12 Acute hypoxic respiratory failure - TRALI, extubated 11/24, guaifenesin  prn for secretions. IS and flutter valve.  Right gluteal hematoma Right psoas hematoma Right 11th rib fracture Multiple lumbar vertebral body transverse process fractures Right scalp hematoma R heel ulcer, appears to be a stage I pressure ulcer - Prevalon boot to right foot while in bed, mepilex dressing. ID - Completed 7 day course for enterobacter and kleb pna. None currently. FEN - Reg diet. Prosource BID. Agitation -  Klonopin  0.25mg  BID for anxiety. Seroquel  at HS. On Trazodone , Cymbalta , and Hydroxyzine  per psych. Psych recommends therapy/psychiatry referral upon DC VTE - SCDs, LMWH.  ASA 81 mg daily x 30 days for DVT ppx per Ortho  Dispo - Med-surg.  Therapies. LOG SNF.    [name] [name] 11/27/2023
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
## 2023-11-27 NOTE — TOC Progression Note (Signed)
 Transition of Care [facility]) - Progression Note    Patient Details  Name: [name] [name] MRN: [id number] Date of Birth: February 20, 1961  Transition [facility]) CM/SW Contact  Hawthorne Day M, RN Phone Number: 11/27/2023, 4:39 PM  Clinical Narrative:    Left message for Donnamarie in admission at [facility] requesting follow up on possible LOG SNF.     Expected Discharge Plan: Skilled Nursing Facility Barriers to Discharge: Continued Medical Work up  Expected Discharge Plan and Services   Discharge Planning Services: CM Consult                                           Social Determinants of Health (SDOH) Interventions SDOH Screenings   Food Insecurity: Patient Unable To Answer (10/18/2023)  Housing: Patient Unable To Answer (10/18/2023)  Transportation Needs: Patient Unable To Answer (10/18/2023)  Utilities: Patient Unable To Answer (10/18/2023)  Tobacco Use: Low Risk  (11/18/2023)    Readmission Risk Interventions     No data to display         Mliss MICAEL Fass, RN, BSN  Trauma/Neuro ICU Case Manager [phone]

## 2023-11-28 MED ORDER — GABAPENTIN 100 MG PO CAPS
100.0000 mg | ORAL_CAPSULE | Freq: Two times a day (BID) | ORAL | Status: DC
  Administered 2023-11-28 – 2023-11-30 (×5): 100 mg via ORAL
  Filled 2023-11-28 (×5): qty 1

## 2023-11-28 NOTE — Plan of Care (Signed)
  Problem: Clinical Measurements: Goal: Will remain free from infection Outcome: Progressing Goal: Respiratory complications will improve Outcome: Progressing   Problem: Nutrition: Goal: Adequate nutrition will be maintained Outcome: Progressing   Problem: Skin Integrity: Goal: Risk for impaired skin integrity will decrease Outcome: Progressing   Problem: Education: Goal: Verbalization of understanding the information provided (i.e., activity precautions, restrictions, etc) will improve Outcome: Progressing Goal: Individualized Educational Video(s) Outcome: Progressing   Problem: Activity: Goal: Ability to ambulate and perform ADLs will improve Outcome: Progressing   Problem: Clinical Measurements: Goal: Postoperative complications will be avoided or minimized Outcome: Progressing   Problem: Self-Concept: Goal: Ability to maintain and perform role responsibilities to the fullest extent possible will improve Outcome: Progressing   Problem: Pain Management: Goal: Pain level will decrease Outcome: Progressing

## 2023-11-28 NOTE — Progress Notes (Signed)
 42 Days Post-Op   Subjective/Chief Complaint: Complains of neuropathy bilateral feet (tingling), otherwise spasms better   Objective: Vital signs in last 24 hours: Temp:  [98 F (36.7 C)-98.3 F (36.8 C)] 98 F (36.7 C) (01/01 0800) Pulse Rate:  [88-102] 92 (01/01 0800) Resp:  [14-20] 16 (01/01 0800) BP: (115-130)/(86-90) 130/90 (01/01 0800) SpO2:  [99 %-100 %] 100 % (01/01 0800) Last BM Date : 11/27/23  Intake/Output from previous day: 12/31 0701 - 01/01 0700 In: 600 [P.O.:600] Out: 1250 [Urine:1250] Intake/Output this shift: No intake/output data recorded.  Gen:  Alert, NAD Pulm: effort normal Abd: soft, nondistended, midline wound is closed inferiorly, skin remains open superiorly with fascia intact Ext: warm and well-perfused.  Psych: A&Ox3  Lab Results:  No results for input(s): WBC, HGB, HCT, PLT in the last 72 hours. BMET No results for input(s): NA, K, CL, CO2, GLUCOSE, BUN, CREATININE, CALCIUM  in the last 72 hours. PT/INR No results for input(s): LABPROT, INR in the last 72 hours. ABG No results for input(s): PHART, HCO3 in the last 72 hours.  Invalid input(s): PCO2, PO2  Studies/Results: No results found.  Anti-infectives: Anti-infectives (From admission, onward)    Start     Dose/Rate Route Frequency Ordered Stop   10/24/23 1800  ceFEPIme  (MAXIPIME ) 2 g in sodium chloride  0.9 % 100 mL IVPB        2 g 200 mL/hr over 30 Minutes Intravenous Every 12 hours 10/24/23 1108 10/26/23 1813   10/21/23 1400  ceFEPIme  (MAXIPIME ) 2 g in sodium chloride  0.9 % 100 mL IVPB  Status:  Discontinued        2 g 200 mL/hr over 30 Minutes Intravenous Every 8 hours 10/21/23 0844 10/24/23 1108   10/20/23 1900  ceFEPIme  (MAXIPIME ) 2 g in sodium chloride  0.9 % 100 mL IVPB  Status:  Discontinued        2 g 200 mL/hr over 30 Minutes Intravenous Every 12 hours 10/20/23 0912 10/21/23 0844   10/19/23 2300  ceFEPIme  (MAXIPIME ) 2 g in sodium  chloride 0.9 % 100 mL IVPB  Status:  Discontinued        2 g 200 mL/hr over 30 Minutes Intravenous Every 8 hours 10/19/23 2157 10/20/23 0912   10/17/23 2200  ceFAZolin  (ANCEF ) IVPB 2g/100 mL premix        2 g 200 mL/hr over 30 Minutes Intravenous Every 8 hours 10/17/23 1541 10/18/23 1417   10/12/23 1745  ceFEPIme  (MAXIPIME ) 2 g in sodium chloride  0.9 % 100 mL IVPB  Status:  Discontinued        2 g 200 mL/hr over 30 Minutes Intravenous Every 8 hours 10/12/23 1319 10/14/23 1037   10/11/23 0945  ceFEPIme  (MAXIPIME ) 2 g in sodium chloride  0.9 % 100 mL IVPB  Status:  Discontinued        2 g 200 mL/hr over 30 Minutes Intravenous Every 12 hours 10/11/23 0846 10/12/23 1319       Assessment/Plan: PHBC   S/P ex lap, pelvic packing (5 laps), and temporary abdominal closure with vicryl mesh by Dr. Lyndel 11/12 - return to OR 11/13 for ex lap, removal of packs and Abthera placement by Dr. Sebastian S/P ex lap and closure 11/18 Dr. Paola. BID saline WTD to upper portion of midline wound. On regular diet and having bowel function.  Aortic transsection - Dr. 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 Vascular reviewed f/u CTA. F/u 1 year.  R acetabular FX, B superior and inferior rami FXs, R iliac FX, L  sacral FX - Dr. Sherida consulted, R femur in skeletal traction, S/P SI screw and L acetab perc fixation 11/20 by Dr. Kendal. RLE traction as definitive management for a total of 4 weeks (~12/18).  CT 12/11 showed R hip hematoma. RLE Traction off and WB updated per Dr. Kendal WBAT LLE for transfers. Continue NWB RLE. Repeat xrays done 12/18. Stable for d/c from ortho standpoint. On Vit D.  Pelvic hemorrhage - resolved, S/P IR embolization by Dr. Jennefer 11/12 Acute hypoxic respiratory failure - TRALI, extubated 11/24, guaifenesin  prn for secretions. IS and flutter valve.  Right gluteal hematoma Right psoas hematoma Right 11th rib fracture Multiple lumbar vertebral body transverse process fractures Right scalp hematoma R  heel ulcer, appears to be a stage I pressure ulcer - Prevalon boot to right foot while in bed, mepilex dressing. ID - Completed 7 day course for enterobacter and kleb pna. None currently. FEN - Reg diet. Prosource BID. Agitation -  Klonopin  0.25mg  BID for anxiety. Seroquel  at HS. On Trazodone , Cymbalta , and Hydroxyzine  per psych. Psych recommends therapy/psychiatry referral upon DC VTE - SCDs, LMWH. ASA 81 mg daily x 30 days for DVT ppx per Ortho  Dispo - Med-surg.  Therapies. LOG SNF.  Will start neurontin  today at low dose   [name] [name] 11/28/2023

## 2023-11-29 LAB — VITAMIN D 25 HYDROXY (VIT D DEFICIENCY, FRACTURES): Vit D, 25-Hydroxy: 42.98 ng/mL (ref 30–100)

## 2023-11-29 MED ORDER — LIDOCAINE 5 % EX PTCH
2.0000 | MEDICATED_PATCH | CUTANEOUS | Status: DC
  Administered 2023-11-29 – 2023-12-17 (×17): 2 via TRANSDERMAL
  Filled 2023-11-29 (×18): qty 2

## 2023-11-29 NOTE — Plan of Care (Signed)
  Problem: Nutrition: Goal: Adequate nutrition will be maintained Outcome: Progressing   Problem: Activity: Goal: Ability to ambulate and perform ADLs will improve Outcome: Progressing   Problem: Pain Management: Goal: Pain level will decrease Outcome: Progressing
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
## 2023-11-29 NOTE — Progress Notes (Addendum)
 43 Days Post-Op   Subjective/Chief Complaint: spasms left hip and low back that are temporarily relieved by pain meds. States he is having BMs and eating small amounts but w/ decreased appetite.    Objective: Vital signs in last 24 hours: Temp:  [97.5 F (36.4 C)-98.5 F (36.9 C)] 97.5 F (36.4 C) (01/02 0813) Pulse Rate:  [86-100] 86 (01/02 0813) Resp:  [16-18] 18 (01/02 0813) BP: (119-134)/(77-92) 134/92 (01/02 0813) SpO2:  [99 %-100 %] 100 % (01/02 0813) Last BM Date : 11/27/23  Intake/Output from previous day: No intake/output data recorded. Intake/Output this shift: Total I/O In: -  Out: 200 [Urine:200]  Gen:  Alert, NAD Pulm: effort normal Abd: soft, nondistended, midline wound is closed inferiorly, skin remains open superiorly with fascia intact Ext: warm and well-perfused.  Psych: A&Ox3  Studies/Results: No results found.  Anti-infectives: Anti-infectives (From admission, onward)    Start     Dose/Rate Route Frequency Ordered Stop   10/24/23 1800  ceFEPIme  (MAXIPIME ) 2 g in sodium chloride  0.9 % 100 mL IVPB        2 g 200 mL/hr over 30 Minutes Intravenous Every 12 hours 10/24/23 1108 10/26/23 1813   10/21/23 1400  ceFEPIme  (MAXIPIME ) 2 g in sodium chloride  0.9 % 100 mL IVPB  Status:  Discontinued        2 g 200 mL/hr over 30 Minutes Intravenous Every 8 hours 10/21/23 0844 10/24/23 1108   10/20/23 1900  ceFEPIme  (MAXIPIME ) 2 g in sodium chloride  0.9 % 100 mL IVPB  Status:  Discontinued        2 g 200 mL/hr over 30 Minutes Intravenous Every 12 hours 10/20/23 0912 10/21/23 0844   10/19/23 2300  ceFEPIme  (MAXIPIME ) 2 g in sodium chloride  0.9 % 100 mL IVPB  Status:  Discontinued        2 g 200 mL/hr over 30 Minutes Intravenous Every 8 hours 10/19/23 2157 10/20/23 0912   10/17/23 2200  ceFAZolin  (ANCEF ) IVPB 2g/100 mL premix        2 g 200 mL/hr over 30 Minutes Intravenous Every 8 hours 10/17/23 1541 10/18/23 1417   10/12/23 1745  ceFEPIme  (MAXIPIME ) 2 g in  sodium chloride  0.9 % 100 mL IVPB  Status:  Discontinued        2 g 200 mL/hr over 30 Minutes Intravenous Every 8 hours 10/12/23 1319 10/14/23 1037   10/11/23 0945  ceFEPIme  (MAXIPIME ) 2 g in sodium chloride  0.9 % 100 mL IVPB  Status:  Discontinued        2 g 200 mL/hr over 30 Minutes Intravenous Every 12 hours 10/11/23 0846 10/12/23 1319       Assessment/Plan: PHBC   S/P ex lap, pelvic packing (5 laps), and temporary abdominal closure with vicryl mesh by Dr. Lyndel 11/12 - return to OR 11/13 for ex lap, removal of packs and Abthera placement by Dr. Sebastian S/P ex lap and closure 11/18 Dr. Paola. BID saline WTD to upper portion of midline wound. On regular diet and having bowel function.  Aortic transsection - Dr. 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 Vascular reviewed f/u CTA. F/u 1 year.  R acetabular FX, B superior and inferior rami FXs, R iliac FX, L sacral FX - Dr. Sherida consulted, R femur in skeletal traction, S/P SI screw and L acetab perc fixation 11/20 by Dr. Kendal. RLE traction as definitive management for a total of 4 weeks (~12/18).  CT 12/11 showed R hip hematoma. RLE Traction off and WB updated per  Dr. Kendal WBAT LLE for transfers. Continue NWB RLE. Repeat xrays done 12/18. Stable for d/c from ortho standpoint. On Vit D.  Pelvic hemorrhage - resolved, S/P IR embolization by Dr. Jennefer 11/12 Acute hypoxic respiratory failure - TRALI, extubated 11/24, guaifenesin  prn for secretions. IS and flutter valve.  Right gluteal hematoma Right psoas hematoma Right 11th rib fracture Multiple lumbar vertebral body transverse process fractures Right scalp hematoma R heel ulcer, appears to be a stage I pressure ulcer - Prevalon boot to right foot while in bed, mepilex dressing. ID - Completed 7 day course for enterobacter and kleb pna. None currently. FEN - Reg diet. Prosource BID. Agitation -  Klonopin  0.25mg  BID for anxiety. Seroquel  at HS. On Trazodone , Cymbalta , and Hydroxyzine  per psych.  Psych recommends therapy/psychiatry referral upon DC VTE - SCDs, LMWH. ASA 81 mg daily x 30 days for DVT ppx per Ortho  Dispo - Med-surg.  Therapies. LOG SNF.  Neurontin  started 1/1 for peripheral neuropathy Add lidoderm  patch to left hip/back. Continue scheduled flexeril .  Stable for D/C to SNF once bed available   Almarie GORMAN Pringle PA-C 11/29/2023

## 2023-11-29 NOTE — TOC Progression Note (Signed)
 Transition of Care [facility]) - Progression Note    Patient Details  Name: [name] [name] MRN: [id number] Date of Birth: Jul 29, 1961  Transition of [facility]) CM/SW Contact  Harlene Sharps, LCSW Phone Number: 11/29/2023, 11:48 AM  Clinical Narrative:    CSW spoke with Raul at [facility] and asked if they would accept a 30 day LOG due to pt having [employer]. Donnamarie states he will speak with central intake and follow back up with CSW.    Expected Discharge Plan: Skilled Nursing Facility Barriers to Discharge: Continued Medical Work up  Expected Discharge Plan and Services   Discharge Planning Services: CM Consult                                           Social Determinants of Health (SDOH) Interventions SDOH Screenings   Food Insecurity: Patient Unable To Answer (10/18/2023)  Housing: Patient Unable To Answer (10/18/2023)  Transportation Needs: Patient Unable To Answer (10/18/2023)  Utilities: Patient Unable To Answer (10/18/2023)  Tobacco Use: Low Risk  (11/18/2023)    Readmission Risk Interventions     No data to display
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
## 2023-11-30 MED ORDER — GABAPENTIN 100 MG PO CAPS
200.0000 mg | ORAL_CAPSULE | Freq: Two times a day (BID) | ORAL | Status: DC
  Administered 2023-11-30 – 2023-12-02 (×5): 200 mg via ORAL
  Filled 2023-11-30 (×6): qty 2

## 2023-11-30 MED ORDER — CYCLOBENZAPRINE HCL 10 MG PO TABS
10.0000 mg | ORAL_TABLET | Freq: Three times a day (TID) | ORAL | Status: DC
  Administered 2023-11-30 – 2023-12-12 (×36): 10 mg via ORAL
  Filled 2023-11-30 (×36): qty 1

## 2023-11-30 NOTE — Progress Notes (Signed)
   11/30/23 1210  Mobility  Activity Refused mobility  Mobility Specialist Start Time (ACUTE ONLY) 1210  Mobility Specialist Stop Time (ACUTE ONLY) 1212  Mobility Specialist Time Calculation (min) (ACUTE ONLY) 2 min   Mobility Specialist: Progress Note  Pt refused mobility session d/t wanting to sit in the chair for lunch. - Received and left in bed with all needs met. Call bell within reach. MS will follow up as time permits.   Virgle Boards, BS Mobility Specialist Please contact via SecureChat or Rehab office at [phone].

## 2023-11-30 NOTE — Plan of Care (Signed)
  Problem: Clinical Measurements: Goal: Will remain free from infection Outcome: Progressing Goal: Respiratory complications will improve Outcome: Progressing   Problem: Nutrition: Goal: Adequate nutrition will be maintained Outcome: Progressing   Problem: Activity: Goal: Ability to ambulate and perform ADLs will improve Outcome: Progressing
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
## 2023-11-30 NOTE — TOC Progression Note (Signed)
 Transition of Care [facility]) - Progression Note    Patient Details  Name: [name] [name] MRN: [id number] Date of Birth: Nov 29, 1960  Transition of Care [facility]) CM/SW Contact  Harlene Sharps, LCSW Phone Number: 11/30/2023, 2:55 PM  Clinical Narrative:     CSW spoke with Tammy at [place] who will continue reviewing pt, unsure if they can offer. TOC will continue to follow.   Expected Discharge Plan: Skilled Nursing Facility Barriers to Discharge: Continued Medical Work up  Expected Discharge Plan and Services   Discharge Planning Services: CM Consult                                           Social Determinants of Health (SDOH) Interventions SDOH Screenings   Food Insecurity: Patient Unable To Answer (10/18/2023)  Housing: Patient Unable To Answer (10/18/2023)  Transportation Needs: Patient Unable To Answer (10/18/2023)  Utilities: Patient Unable To Answer (10/18/2023)  Tobacco Use: Low Risk  (11/18/2023)    Readmission Risk Interventions     No data to display

## 2023-11-30 NOTE — Progress Notes (Addendum)
 44 Days Post-Op   Subjective/Chief Complaint: Lift hip/low back spasms unchanged from yesterday and possibly a little worse with temporary relief from pain meds. Has some baseline neuropathy in his feet that is not worse. Denies weakness No other complaints  Objective: Vital signs in last 24 hours: Temp:  [97.9 F (36.6 C)-98.5 F (36.9 C)] 97.9 F (36.6 C) (01/03 0734) Pulse Rate:  [84-98] 84 (01/03 0734) Resp:  [16-18] 18 (01/03 0734) BP: (115-126)/(78-87) 126/83 (01/03 0734) SpO2:  [100 %] 100 % (01/03 0734) Last BM Date : 11/29/23  Intake/Output from previous day: 01/02 0701 - 01/03 0700 In: 360 [P.O.:360] Out: 200 [Urine:200] Intake/Output this shift: No intake/output data recorded.  Gen:  Alert, NAD Pulm: effort normal Abd: soft, nondistended, midline wound is closed inferiorly, skin remains open superiorly with fascia intact Ext: warm and well-perfused.  Psych: A&Ox3  Studies/Results: No results found.  Anti-infectives: Anti-infectives (From admission, onward)    Start     Dose/Rate Route Frequency Ordered Stop   10/24/23 1800  ceFEPIme  (MAXIPIME ) 2 g in sodium chloride  0.9 % 100 mL IVPB        2 g 200 mL/hr over 30 Minutes Intravenous Every 12 hours 10/24/23 1108 10/26/23 1813   10/21/23 1400  ceFEPIme  (MAXIPIME ) 2 g in sodium chloride  0.9 % 100 mL IVPB  Status:  Discontinued        2 g 200 mL/hr over 30 Minutes Intravenous Every 8 hours 10/21/23 0844 10/24/23 1108   10/20/23 1900  ceFEPIme  (MAXIPIME ) 2 g in sodium chloride  0.9 % 100 mL IVPB  Status:  Discontinued        2 g 200 mL/hr over 30 Minutes Intravenous Every 12 hours 10/20/23 0912 10/21/23 0844   10/19/23 2300  ceFEPIme  (MAXIPIME ) 2 g in sodium chloride  0.9 % 100 mL IVPB  Status:  Discontinued        2 g 200 mL/hr over 30 Minutes Intravenous Every 8 hours 10/19/23 2157 10/20/23 0912   10/17/23 2200  ceFAZolin  (ANCEF ) IVPB 2g/100 mL premix        2 g 200 mL/hr over 30 Minutes Intravenous Every 8  hours 10/17/23 1541 10/18/23 1417   10/12/23 1745  ceFEPIme  (MAXIPIME ) 2 g in sodium chloride  0.9 % 100 mL IVPB  Status:  Discontinued        2 g 200 mL/hr over 30 Minutes Intravenous Every 8 hours 10/12/23 1319 10/14/23 1037   10/11/23 0945  ceFEPIme  (MAXIPIME ) 2 g in sodium chloride  0.9 % 100 mL IVPB  Status:  Discontinued        2 g 200 mL/hr over 30 Minutes Intravenous Every 12 hours 10/11/23 0846 10/12/23 1319       Assessment/Plan: PHBC   S/P ex lap, pelvic packing (5 laps), and temporary abdominal closure with vicryl mesh by Dr. Lyndel 11/12 - return to OR 11/13 for ex lap, removal of packs and Abthera placement by Dr. Sebastian S/P ex lap and closure 11/18 Dr. Paola. BID saline WTD to upper portion of midline wound. On regular diet and having bowel function.  Aortic transsection - Dr. 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 Vascular reviewed f/u CTA. F/u 1 year.  R acetabular FX, B superior and inferior rami FXs, R iliac FX, L sacral FX - Dr. Sherida consulted, R femur in skeletal traction, S/P SI screw and L acetab perc fixation 11/20 by Dr. Kendal. RLE traction as definitive management for a total of 4 weeks (~12/18).  CT 12/11 showed R hip  hematoma. RLE Traction off and WB updated per Dr. Kendal WBAT LLE for transfers. Continue NWB RLE. Repeat xrays done 12/18. Stable for d/c from ortho standpoint. On Vit D.  Pelvic hemorrhage - resolved, S/P IR embolization by Dr. Jennefer 11/12 Acute hypoxic respiratory failure - TRALI, extubated 11/24, guaifenesin  prn for secretions. IS and flutter valve.  Right gluteal hematoma Right psoas hematoma Right 11th rib fracture Multiple lumbar vertebral body transverse process fractures Right scalp hematoma R heel ulcer, appears to be a stage I pressure ulcer - Prevalon boot to right foot while in bed, mepilex dressing.  ID - Completed 7 day course for enterobacter and kleb pna. None currently. FEN - Reg diet. Prosource BID. Agitation -  Klonopin  0.25mg  BID for  anxiety. Seroquel  at HS. On Trazodone , Cymbalta , and Hydroxyzine  per psych. Psych recommends therapy/psychiatry referral upon DC VTE - SCDs, LMWH. ASA 81 mg daily x 30 days for DVT ppx per Ortho  Dispo - Med-surg.  Therapies. LOG SNF.  Neurontin  started 1/1 for peripheral neuropathy. He takes this at baseline. Will increase dose to 200 mg bid today lidoderm  patch to left hip/back. Continue scheduled flexeril  - inc to 10mg  tid today. Dc prn robaxin  Stable for D/C to SNF once bed available   [place] VEAR Mais, [facility] 11/30/2023, 9:17 AM Please see Amion for pager number during day hours 7:00am-4:30pm

## 2023-12-01 NOTE — Progress Notes (Signed)
 Mobility Specialist Progress Note:    12/01/23 1558  Mobility  Activity Transferred from bed to chair  Level of Assistance Minimal assist, patient does 75% or more  Assistive Device Front wheel walker  Distance Ambulated (ft) 4 ft  RLE Weight Bearing Per Provider Order NWB  LLE Weight Bearing Per Provider Order WBAT  Activity Response Tolerated well  Mobility Referral Yes  Mobility visit 1 Mobility  Mobility Specialist Start Time (ACUTE ONLY) 1453  Mobility Specialist Stop Time (ACUTE ONLY) 1503  Mobility Specialist Time Calculation (min) (ACUTE ONLY) 10 min   Pt received in bed agreeable to mobility. Pt needed MinA w/ STS and bed mobility. Was able to practice WB precautions w/o fault. Took a couple hops towards the chair. Situated in chair w/ call bell and personal belongings in reach. All needs met.  Thersia Minder Mobility Specialist  Please contact vis Secure Chat or  Rehab Office [phone]

## 2023-12-01 NOTE — Plan of Care (Signed)
  Problem: Clinical Measurements: Goal: Will remain free from infection Outcome: Progressing Goal: Respiratory complications will improve Outcome: Progressing   Problem: Nutrition: Goal: Adequate nutrition will be maintained Outcome: Progressing   Problem: Skin Integrity: Goal: Risk for impaired skin integrity will decrease Outcome: Progressing   Problem: Education: Goal: Verbalization of understanding the information provided (i.e., activity precautions, restrictions, etc) will improve Outcome: Progressing Goal: Individualized Educational Video(s) Outcome: Progressing   Problem: Activity: Goal: Ability to ambulate and perform ADLs will improve Outcome: Progressing   Problem: Clinical Measurements: Goal: Postoperative complications will be avoided or minimized Outcome: Progressing   Problem: Self-Concept: Goal: Ability to maintain and perform role responsibilities to the fullest extent possible will improve Outcome: Progressing   Problem: Pain Management: Goal: Pain level will decrease Outcome: Progressing

## 2023-12-01 NOTE — Progress Notes (Signed)
 Progress Note  45 Days Post-Op  Subjective: Patient reports he is still having some muscle spasm but a little better than yesterday. Tolerating diet and having bowel function. Plans to get up later today.   Objective: Vital signs in last 24 hours: Temp:  [97.9 F (36.6 C)-98.3 F (36.8 C)] 98.1 F (36.7 C) (01/04 0730) Pulse Rate:  [84-94] 94 (01/04 0730) Resp:  [16-18] 16 (01/04 0730) BP: (108-121)/(74-89) 121/85 (01/04 0730) SpO2:  [98 %-100 %] 100 % (01/04 0730) Last BM Date : 11/30/23  Intake/Output from previous day: 01/03 0701 - 01/04 0700 In: 1320 [P.O.:1320] Out: 2525 [Urine:2525] Intake/Output this shift: No intake/output data recorded.  PE: Pulm: effort normal Abd: soft, nondistended, midline wound is closed inferiorly with some hypergranulation tissue, skin remains open superiorly with small amount fibrin and healthy appearing granulation tissue, visible sutures present  Ext: warm and well-perfused.  Psych: A&Ox3   Lab Results:  No results for input(s): WBC, HGB, HCT, PLT in the last 72 hours. BMET No results for input(s): NA, K, CL, CO2, GLUCOSE, BUN, CREATININE, CALCIUM  in the last 72 hours. PT/INR No results for input(s): LABPROT, INR in the last 72 hours. CMP     Component Value Date/Time   NA 134 (L) 11/11/2023 0545   K 4.1 11/11/2023 0545   CL 93 (L) 11/11/2023 0545   CO2 27 11/11/2023 0545   GLUCOSE 92 11/11/2023 0545   BUN 14 11/11/2023 0545   CREATININE 1.12 11/11/2023 0545   CALCIUM  8.9 11/11/2023 0545   PROT 7.1 10/25/2023 0520   ALBUMIN  2.4 (L) 10/25/2023 0520   AST 47 (H) 10/25/2023 0520   ALT 45 (H) 10/25/2023 0520   ALKPHOS 127 (H) 10/25/2023 0520   BILITOT 2.5 (H) 10/25/2023 0520   GFRNONAA >60 11/11/2023 0545   Lipase  No results found for: LIPASE     Studies/Results: No results found.  Anti-infectives: Anti-infectives (From admission, onward)    Start     Dose/Rate Route Frequency  Ordered Stop   10/24/23 1800  ceFEPIme  (MAXIPIME ) 2 g in sodium chloride  0.9 % 100 mL IVPB        2 g 200 mL/hr over 30 Minutes Intravenous Every 12 hours 10/24/23 1108 10/26/23 1813   10/21/23 1400  ceFEPIme  (MAXIPIME ) 2 g in sodium chloride  0.9 % 100 mL IVPB  Status:  Discontinued        2 g 200 mL/hr over 30 Minutes Intravenous Every 8 hours 10/21/23 0844 10/24/23 1108   10/20/23 1900  ceFEPIme  (MAXIPIME ) 2 g in sodium chloride  0.9 % 100 mL IVPB  Status:  Discontinued        2 g 200 mL/hr over 30 Minutes Intravenous Every 12 hours 10/20/23 0912 10/21/23 0844   10/19/23 2300  ceFEPIme  (MAXIPIME ) 2 g in sodium chloride  0.9 % 100 mL IVPB  Status:  Discontinued        2 g 200 mL/hr over 30 Minutes Intravenous Every 8 hours 10/19/23 2157 10/20/23 0912   10/17/23 2200  ceFAZolin  (ANCEF ) IVPB 2g/100 mL premix        2 g 200 mL/hr over 30 Minutes Intravenous Every 8 hours 10/17/23 1541 10/18/23 1417   10/12/23 1745  ceFEPIme  (MAXIPIME ) 2 g in sodium chloride  0.9 % 100 mL IVPB  Status:  Discontinued        2 g 200 mL/hr over 30 Minutes Intravenous Every 8 hours 10/12/23 1319 10/14/23 1037   10/11/23 0945  ceFEPIme  (MAXIPIME ) 2 g in  sodium chloride  0.9 % 100 mL IVPB  Status:  Discontinued        2 g 200 mL/hr over 30 Minutes Intravenous Every 12 hours 10/11/23 0846 10/12/23 1319        Assessment/Plan  PHBC   S/P ex lap, pelvic packing (5 laps), and temporary abdominal closure with vicryl mesh by Dr. Lyndel 11/12 - return to OR 11/13 for ex lap, removal of packs and Abthera placement by Dr. Sebastian S/P ex lap and closure 11/18 Dr. Paola. BID saline WTD to upper portion of midline wound. On regular diet and having bowel function.  - consider silver  nitrate to inferior portion of incision for hypergranulation Aortic transsection - Dr. 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 Vascular reviewed f/u CTA. F/u 1 year.  R acetabular FX, B superior and inferior rami FXs, R iliac FX, L sacral FX - Dr. Sherida  consulted, R femur in skeletal traction, S/P SI screw and L acetab perc fixation 11/20 by Dr. Kendal. RLE traction as definitive management for a total of 4 weeks (~12/18).  CT 12/11 showed R hip hematoma. RLE Traction off and WB updated per Dr. Kendal WBAT LLE for transfers. Continue NWB RLE. Repeat xrays done 12/18. Stable for d/c from ortho standpoint. On Vit D.  Pelvic hemorrhage - resolved, S/P IR embolization by Dr. Jennefer 11/12 Acute hypoxic respiratory failure - TRALI, extubated 11/24, guaifenesin  prn for secretions. IS and flutter valve.  Right gluteal hematoma Right psoas hematoma Right 11th rib fracture Multiple lumbar vertebral body transverse process fractures Right scalp hematoma R heel ulcer, appears to be a stage I pressure ulcer - Prevalon boot to right foot while in bed, mepilex dressing.   ID - Completed 7 day course for enterobacter and kleb pna. None currently. FEN - Reg diet. Prosource BID. Agitation -  Klonopin  0.25mg  BID for anxiety. Seroquel  at HS. On Trazodone , Cymbalta , and Hydroxyzine  per psych. Psych recommends therapy/psychiatry referral upon DC VTE - SCDs, LMWH. ASA 81 mg daily x 30 days for DVT ppx per Ortho  Dispo - Med-surg.  Therapies. LOG SNF.  Neurontin  started 1/1 for peripheral neuropathy. Dose increased 1/3. lidoderm  patch to left hip/back. Continue scheduled flexeril  - inc to 10mg  tid 1/3.  Stable for D/C to SNF once bed available    LOS: 54 days     Burnard JONELLE Louder, [facility] 12/01/2023, 9:15 AM Please see Amion for pager number during day hours 7:00am-4:30pm

## 2023-12-02 NOTE — Progress Notes (Signed)
 Progress Note  46 Days Post-Op  Subjective: No acute change  Objective: Vital signs in last 24 hours: Temp:  [97.7 F (36.5 C)-99.2 F (37.3 C)] 99 F (37.2 C) (01/05 0735) Pulse Rate:  [96-110] 97 (01/05 0735) Resp:  [16-18] 18 (01/05 0735) BP: (108-132)/(67-93) 119/76 (01/05 0735) SpO2:  [92 %-100 %] 100 % (01/05 0735) Last BM Date : 12/02/23  Intake/Output from previous day: 01/04 0701 - 01/05 0700 In: 240 [P.O.:240] Out: 925 [Urine:925] Intake/Output this shift: Total I/O In: -  Out: 300 [Urine:300]  PE: Pulm: effort normal Abd: soft, nondistended Ext: warm and well-perfused.  Psych: A&Ox3   Lab Results:  No results for input(s): WBC, HGB, HCT, PLT in the last 72 hours. BMET No results for input(s): NA, K, CL, CO2, GLUCOSE, BUN, CREATININE, CALCIUM  in the last 72 hours. PT/INR No results for input(s): LABPROT, INR in the last 72 hours. CMP     Component Value Date/Time   NA 134 (L) 11/11/2023 0545   K 4.1 11/11/2023 0545   CL 93 (L) 11/11/2023 0545   CO2 27 11/11/2023 0545   GLUCOSE 92 11/11/2023 0545   BUN 14 11/11/2023 0545   CREATININE 1.12 11/11/2023 0545   CALCIUM  8.9 11/11/2023 0545   PROT 7.1 10/25/2023 0520   ALBUMIN  2.4 (L) 10/25/2023 0520   AST 47 (H) 10/25/2023 0520   ALT 45 (H) 10/25/2023 0520   ALKPHOS 127 (H) 10/25/2023 0520   BILITOT 2.5 (H) 10/25/2023 0520   GFRNONAA >60 11/11/2023 0545   Lipase  No results found for: LIPASE     Studies/Results: No results found.  Anti-infectives: Anti-infectives (From admission, onward)    Start     Dose/Rate Route Frequency Ordered Stop   10/24/23 1800  ceFEPIme  (MAXIPIME ) 2 g in sodium chloride  0.9 % 100 mL IVPB        2 g 200 mL/hr over 30 Minutes Intravenous Every 12 hours 10/24/23 1108 10/26/23 1813   10/21/23 1400  ceFEPIme  (MAXIPIME ) 2 g in sodium chloride  0.9 % 100 mL IVPB  Status:  Discontinued        2 g 200 mL/hr over 30 Minutes Intravenous  Every 8 hours 10/21/23 0844 10/24/23 1108   10/20/23 1900  ceFEPIme  (MAXIPIME ) 2 g in sodium chloride  0.9 % 100 mL IVPB  Status:  Discontinued        2 g 200 mL/hr over 30 Minutes Intravenous Every 12 hours 10/20/23 0912 10/21/23 0844   10/19/23 2300  ceFEPIme  (MAXIPIME ) 2 g in sodium chloride  0.9 % 100 mL IVPB  Status:  Discontinued        2 g 200 mL/hr over 30 Minutes Intravenous Every 8 hours 10/19/23 2157 10/20/23 0912   10/17/23 2200  ceFAZolin  (ANCEF ) IVPB 2g/100 mL premix        2 g 200 mL/hr over 30 Minutes Intravenous Every 8 hours 10/17/23 1541 10/18/23 1417   10/12/23 1745  ceFEPIme  (MAXIPIME ) 2 g in sodium chloride  0.9 % 100 mL IVPB  Status:  Discontinued        2 g 200 mL/hr over 30 Minutes Intravenous Every 8 hours 10/12/23 1319 10/14/23 1037   10/11/23 0945  ceFEPIme  (MAXIPIME ) 2 g in sodium chloride  0.9 % 100 mL IVPB  Status:  Discontinued        2 g 200 mL/hr over 30 Minutes Intravenous Every 12 hours 10/11/23 0846 10/12/23 1319        Assessment/Plan  PHBC   S/P ex  lap, pelvic packing (5 laps), and temporary abdominal closure with vicryl mesh by Dr. Lyndel 11/12 - return to OR 11/13 for ex lap, removal of packs and Abthera placement by Dr. Sebastian S/P ex lap and closure 11/18 Dr. Paola. BID saline WTD to upper portion of midline wound. On regular diet and having bowel function.  - consider silver  nitrate to inferior portion of incision for hypergranulation Aortic transsection - Dr. 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 Vascular reviewed f/u CTA. F/u 1 year.  R acetabular FX, B superior and inferior rami FXs, R iliac FX, L sacral FX - Dr. Sherida consulted, R femur in skeletal traction, S/P SI screw and L acetab perc fixation 11/20 by Dr. Kendal. RLE traction as definitive management for a total of 4 weeks (~12/18).  CT 12/11 showed R hip hematoma. RLE Traction off and WB updated per Dr. Kendal WBAT LLE for transfers. Continue NWB RLE. Repeat xrays done 12/18. Stable for d/c from  ortho standpoint. On Vit D.  Pelvic hemorrhage - resolved, S/P IR embolization by Dr. Jennefer 11/12 Acute hypoxic respiratory failure - TRALI, extubated 11/24, guaifenesin  prn for secretions. IS and flutter valve.  Right gluteal hematoma Right psoas hematoma Right 11th rib fracture Multiple lumbar vertebral body transverse process fractures Right scalp hematoma R heel ulcer, appears to be a stage I pressure ulcer - Prevalon boot to right foot while in bed, mepilex dressing.   ID - Completed 7 day course for enterobacter and kleb pna. None currently. FEN - Reg diet. Prosource BID. Agitation -  Klonopin  0.25mg  BID for anxiety. Seroquel  at HS. On Trazodone , Cymbalta , and Hydroxyzine  per psych. Psych recommends therapy/psychiatry referral upon DC VTE - SCDs, LMWH. ASA 81 mg daily x 30 days for DVT ppx per Ortho  Dispo - Med-surg.  Therapies. LOG SNF.  Neurontin  started 1/1 for peripheral neuropathy. Dose increased 1/3. lidoderm  patch to left hip/back. Continue scheduled flexeril  - inc to 10mg  tid 1/3.  Stable for D/C to SNF once bed available    LOS: 55 days     Burnard JONELLE Louder, [facility] Surgery 12/02/2023, 9:41 AM Please see Amion for pager number during day hours 7:00am-4:30pm

## 2023-12-02 NOTE — Progress Notes (Signed)
Abdominal dressing changed.  

## 2023-12-02 NOTE — Progress Notes (Signed)
 Mobility Specialist Progress Note:    12/02/23 1443  Mobility  Activity Transferred from chair to bed  Level of Assistance Minimal assist, patient does 75% or more  Assistive Device Front wheel walker  Distance Ambulated (ft) 5 ft  RLE Weight Bearing Per Provider Order NWB  LLE Weight Bearing Per Provider Order WBAT  Activity Response Tolerated well  Mobility Referral Yes  Mobility visit 1 Mobility  Mobility Specialist Start Time (ACUTE ONLY) 1415  Mobility Specialist Stop Time (ACUTE ONLY) 1432  Mobility Specialist Time Calculation (min) (ACUTE ONLY) 17 min   Pt received in bed agreeable to mobility. Pt needed no physical assistance w/ bed mobility and MinA w/ STS. Was able to take a couple steps towards the chair and practice WB precautions w/o fault. Situated in chair w/ call bell and personal belongings in reach. All needs met.  Thersia Minder Mobility Specialist  Please contact vis Secure Chat or  Rehab Office [phone]

## 2023-12-02 NOTE — Plan of Care (Signed)
  Problem: Clinical Measurements: Goal: Will remain free from infection Outcome: Progressing Goal: Respiratory complications will improve Outcome: Progressing   Problem: Nutrition: Goal: Adequate nutrition will be maintained Outcome: Progressing   Problem: Skin Integrity: Goal: Risk for impaired skin integrity will decrease Outcome: Progressing   Problem: Education: Goal: Verbalization of understanding the information provided (i.e., activity precautions, restrictions, etc) will improve Outcome: Progressing Goal: Individualized Educational Video(s) Outcome: Progressing   Problem: Activity: Goal: Ability to ambulate and perform ADLs will improve Outcome: Progressing   Problem: Clinical Measurements: Goal: Postoperative complications will be avoided or minimized Outcome: Progressing   Problem: Self-Concept: Goal: Ability to maintain and perform role responsibilities to the fullest extent possible will improve Outcome: Progressing   Problem: Pain Management: Goal: Pain level will decrease Outcome: Progressing

## 2023-12-03 ENCOUNTER — Inpatient Hospital Stay (HOSPITAL_COMMUNITY): Payer: MEDICAID

## 2023-12-03 MED ORDER — GABAPENTIN 100 MG PO CAPS
200.0000 mg | ORAL_CAPSULE | Freq: Three times a day (TID) | ORAL | Status: DC
  Administered 2023-12-03 – 2023-12-17 (×43): 200 mg via ORAL
  Filled 2023-12-03 (×42): qty 2

## 2023-12-03 MED ORDER — DOCUSATE SODIUM 100 MG PO CAPS
100.0000 mg | ORAL_CAPSULE | Freq: Two times a day (BID) | ORAL | Status: DC
  Administered 2023-12-03 – 2023-12-17 (×27): 100 mg via ORAL
  Filled 2023-12-03 (×28): qty 1

## 2023-12-03 MED ORDER — SUCRALFATE 1 G PO TABS
1.0000 g | ORAL_TABLET | Freq: Three times a day (TID) | ORAL | Status: DC
  Administered 2023-12-03 – 2023-12-17 (×57): 1 g via ORAL
  Filled 2023-12-03 (×57): qty 1

## 2023-12-03 NOTE — Progress Notes (Signed)
 Progress Note  47 Days Post-Op  Subjective: Stable R thigh spasms. Numbness in the RLE that is improved since being started on Neurontin . Tolerating diet without abdominal pain, n/v. BM yesterday. Voiding.   Objective: Vital signs in last 24 hours: Temp:  [97.9 F (36.6 C)-98.7 F (37.1 C)] 97.9 F (36.6 C) (01/06 0803) Pulse Rate:  [86-106] 106 (01/06 0803) Resp:  [17-18] 17 (01/06 0803) BP: (106-132)/(70-87) 106/83 (01/06 0803) SpO2:  [100 %] 100 % (01/06 0803) Last BM Date : 12/02/23  Intake/Output from previous day: 01/05 0701 - 01/06 0700 In: 240 [P.O.:240] Out: 1250 [Urine:1250] Intake/Output this shift: Total I/O In: 220 [P.O.:220] Out: 275 [Urine:275]  PE: Gen:  Alert, NAD Card: Reg Pulm: CTA b/l, normal rate and effort  Abd: soft, nondistended, NT, midline wound with healthy granulation tissue at the base Ext: R hip hematoma and induration improved from when I saw him last. No erythema or heat.  No LE edema. Wiggles digits of b/l LE's. SILT to BLE's. DP 2+ b/l.  Psych: A&Ox3  Lab Results:  No results for input(s): WBC, HGB, HCT, PLT in the last 72 hours. BMET No results for input(s): NA, K, CL, CO2, GLUCOSE, BUN, CREATININE, CALCIUM  in the last 72 hours. PT/INR No results for input(s): LABPROT, INR in the last 72 hours. CMP     Component Value Date/Time   NA 134 (L) 11/11/2023 0545   K 4.1 11/11/2023 0545   CL 93 (L) 11/11/2023 0545   CO2 27 11/11/2023 0545   GLUCOSE 92 11/11/2023 0545   BUN 14 11/11/2023 0545   CREATININE 1.12 11/11/2023 0545   CALCIUM  8.9 11/11/2023 0545   PROT 7.1 10/25/2023 0520   ALBUMIN  2.4 (L) 10/25/2023 0520   AST 47 (H) 10/25/2023 0520   ALT 45 (H) 10/25/2023 0520   ALKPHOS 127 (H) 10/25/2023 0520   BILITOT 2.5 (H) 10/25/2023 0520   GFRNONAA >60 11/11/2023 0545   Lipase  No results found for: LIPASE     Studies/Results: No results found.  Anti-infectives: Anti-infectives (From  admission, onward)    Start     Dose/Rate Route Frequency Ordered Stop   10/24/23 1800  ceFEPIme  (MAXIPIME ) 2 g in sodium chloride  0.9 % 100 mL IVPB        2 g 200 mL/hr over 30 Minutes Intravenous Every 12 hours 10/24/23 1108 10/26/23 1813   10/21/23 1400  ceFEPIme  (MAXIPIME ) 2 g in sodium chloride  0.9 % 100 mL IVPB  Status:  Discontinued        2 g 200 mL/hr over 30 Minutes Intravenous Every 8 hours 10/21/23 0844 10/24/23 1108   10/20/23 1900  ceFEPIme  (MAXIPIME ) 2 g in sodium chloride  0.9 % 100 mL IVPB  Status:  Discontinued        2 g 200 mL/hr over 30 Minutes Intravenous Every 12 hours 10/20/23 0912 10/21/23 0844   10/19/23 2300  ceFEPIme  (MAXIPIME ) 2 g in sodium chloride  0.9 % 100 mL IVPB  Status:  Discontinued        2 g 200 mL/hr over 30 Minutes Intravenous Every 8 hours 10/19/23 2157 10/20/23 0912   10/17/23 2200  ceFAZolin  (ANCEF ) IVPB 2g/100 mL premix        2 g 200 mL/hr over 30 Minutes Intravenous Every 8 hours 10/17/23 1541 10/18/23 1417   10/12/23 1745  ceFEPIme  (MAXIPIME ) 2 g in sodium chloride  0.9 % 100 mL IVPB  Status:  Discontinued        2 g  200 mL/hr over 30 Minutes Intravenous Every 8 hours 10/12/23 1319 10/14/23 1037   10/11/23 0945  ceFEPIme  (MAXIPIME ) 2 g in sodium chloride  0.9 % 100 mL IVPB  Status:  Discontinued        2 g 200 mL/hr over 30 Minutes Intravenous Every 12 hours 10/11/23 0846 10/12/23 1319        Assessment/Plan PHBC   S/P ex lap, pelvic packing (5 laps), and temporary abdominal closure with vicryl mesh by Dr. Lyndel 11/12 - return to OR 11/13 for ex lap, removal of packs and Abthera placement by Dr. Sebastian S/P ex lap and closure 11/18 Dr. Paola. BID saline WTD to upper portion of midline wound. On regular diet and having bowel function. 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 Vascular reviewed f/u CTA. F/u 1 year.  R acetabular FX, B  superior and inferior rami FXs, R iliac FX, L sacral FX - Dr. Sherida consulted, R femur in skeletal traction, S/P SI screw and L acetab perc fixation 11/20 by Dr. Kendal. RLE traction as definitive management for a total of 4 weeks (~12/18).  CT 12/11 showed R hip hematoma. RLE Traction off and WB updated per Dr. Kendal WBAT LLE for transfers. Continue NWB RLE. Repeat xrays done 12/18. Stable for d/c from ortho standpoint. On Vit D. Repeat xrays pending 1/6 - WB status to be updated based on these results.  Pelvic hemorrhage - resolved, S/P IR embolization by Dr. Jennefer 11/12 Acute hypoxic respiratory failure - TRALI, extubated 11/24, guaifenesin  prn for secretions. IS and flutter valve.  Right gluteal hematoma Right psoas hematoma Right 11th rib fracture Multiple lumbar vertebral body transverse process fractures Right scalp hematoma R heel ulcer, appears to be a stage I pressure ulcer - Prevalon boot to right foot while in bed, mepilex dressing.   ID - Completed 7 day course for enterobacter and kleb pna. None currently. FEN - Reg diet. Prosource BID. Agitation -  Klonopin  0.25mg  BID for anxiety. Seroquel  at HS. On Trazodone , Cymbalta , and Hydroxyzine  per psych. Psych recommends therapy/psychiatry referral upon DC VTE - SCDs, LMWH. ASA 81 mg daily x 30 days for DVT ppx per Ortho  Dispo - Med-surg.  Therapies. LOG SNF. Stable for D/C to SNF once bed available     LOS: 56 days   [name] [name], [facility] 12/03/2023, 11:12 AM Please see Amion for pager number during day hours 7:00am-4:30pm

## 2023-12-03 NOTE — Progress Notes (Signed)
 Ortho Trauma Progress Note  Patient doing well today. States therapies have been going well. Having some muscle spasm through the right thigh currently, scheduled for dose of robaxin  in next 10 minutes or so. Swelling over the right hip improved significantly. Occasionally endorses right groin pain and muscle tightness.   PE:  General: Sitting up in bed, NAD Respiratory: No increased WOB at rest Pelvis/BLE: Compartments are soft and compressible b/l.  Soft tissue over the right lateral and anterior hip with resolved swelling. Tolerates gentle hip and knee flexion. Notes some muscle strain around the hip with motion. Ankle dorsiflexion/plantarflexion is intact.  Able to wiggle the toes.  Neuropathy at baseline. But does endorse sensation to light touch over all aspects of the foot B/L.  + DP pulse  IMAGING: Repeat imaging pelvis/acetabulum ordered for today  ASSESSMENT: [name] [name] is a 63 y.o. male s/p  PERCUTANEOUS FIXATION OF LEFT/RIGHT POSTERIOR PELVIS 10/17/23 PERCUTANEOUS FIXATION OF LEFT SUPERIOR PUBIC RAMUS 10/17/23 CLOSED TREATMENT OF RIGHT ACETABULAR FRACTURE 10/17/23  PLAN: Weightbearing:  Continue WBAT LLE for transfers.  Continue NWB RLE ROM: Unrestricted hip/knee motion bilaterally Incisional and dressing care: Ortho incisions open to air Showering: Ok to shower Orthopedic device(s): None Pain management: per trauma VTE prophylaxis: Lovenox , SCDs ID:  peri-op abx completed Foley/Lines: No foley. KVO IVFs Impediments to Fracture Healing: Polytrauma. Vit D level 22. Started on supplementation   Dispo: Continue PT/OT as able. Repeat x-rays R acetabulum ordered for today. Will update WB accordingly once imaging completed.  Ok for d/c from ortho standpoint once cleared by trauma team and therapies.  D/C recs: - Oxycodone , Robaxin  for pain - ASA 81 mg daily x 30 days for DVT ppx - Continue 1000 units Vit D supplementation daily x 90 days   Follow - up plan:  plan for  outpatient follow-up with Dr. Kendal 2 weeks after discharge  Lauraine PATRIC Moores PA-C [facility] [phone] (office) [email or web address]

## 2023-12-03 NOTE — Plan of Care (Signed)
  Problem: Clinical Measurements: Goal: Will remain free from infection Outcome: Progressing Goal: Respiratory complications will improve Outcome: Progressing   Problem: Nutrition: Goal: Adequate nutrition will be maintained Outcome: Progressing   
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
## 2023-12-04 NOTE — Plan of Care (Signed)
  Problem: Clinical Measurements: Goal: Respiratory complications will improve Outcome: Progressing   Problem: Nutrition: Goal: Adequate nutrition will be maintained Outcome: Progressing   Problem: Skin Integrity: Goal: Risk for impaired skin integrity will decrease Outcome: Progressing   Problem: Pain Management: Goal: Pain level will decrease Outcome: Progressing

## 2023-12-04 NOTE — Progress Notes (Signed)
 Progress Note  48 Days Post-Op  Subjective: Stable R thigh spasms. Numbness in the RLE that is improved since increasing Neurontin . Tolerating diet without abdominal pain, n/v. BM yesterday. Voiding.   Objective: Vital signs in last 24 hours: Temp:  [97.8 F (36.6 C)-98.6 F (37 C)] 97.8 F (36.6 C) (01/07 0757) Pulse Rate:  [99-107] 99 (01/07 0757) Resp:  [16-18] 16 (01/07 0757) BP: (104-124)/(72-91) 115/72 (01/07 0757) SpO2:  [100 %] 100 % (01/07 0757) Last BM Date : 12/03/23  Intake/Output from previous day: 01/06 0701 - 01/07 0700 In: 700 [P.O.:700] Out: 1300 [Urine:1300] Intake/Output this shift: No intake/output data recorded.  PE: Gen:  Alert, NAD Card: Reg Pulm: CTA b/l, normal rate and effort  Abd: soft, nondistended, NT, midline wound with healthy granulation tissue at the base Ext: No LE edema. Wiggles digits of b/l LE's. SILT to BLE's. DP 2+ b/l.  Psych: A&Ox3  Lab Results:  No results for input(s): WBC, HGB, HCT, PLT in the last 72 hours. BMET No results for input(s): NA, K, CL, CO2, GLUCOSE, BUN, CREATININE, CALCIUM  in the last 72 hours. PT/INR No results for input(s): LABPROT, INR in the last 72 hours. CMP     Component Value Date/Time   NA 134 (L) 11/11/2023 0545   K 4.1 11/11/2023 0545   CL 93 (L) 11/11/2023 0545   CO2 27 11/11/2023 0545   GLUCOSE 92 11/11/2023 0545   BUN 14 11/11/2023 0545   CREATININE 1.12 11/11/2023 0545   CALCIUM  8.9 11/11/2023 0545   PROT 7.1 10/25/2023 0520   ALBUMIN  2.4 (L) 10/25/2023 0520   AST 47 (H) 10/25/2023 0520   ALT 45 (H) 10/25/2023 0520   ALKPHOS 127 (H) 10/25/2023 0520   BILITOT 2.5 (H) 10/25/2023 0520   GFRNONAA >60 11/11/2023 0545   Lipase  No results found for: LIPASE     Studies/Results: DG Pelvis Comp Min 3V Result Date: 12/03/2023 CLINICAL DATA:  Acetabular fracture EXAM: [facility]ET PELVIS - 3+ VIEW COMPARISON:  11/14/2023 FINDINGS: No significant change in  appearance or alignment of heavily comminuted, displaced subacute fractures of the pelvis, particularly involving the right hemipelvis and acetabulum, although also involving the left pubic rami. Unchanged screw fixation of the bilateral sacroiliac joints as well as the left superior pubic ramus. No evidence of component loosening or new fracture. Nonobstructive pattern of overlying bowel gas. IMPRESSION: No significant change in appearance or alignment of heavily comminuted, displaced subacute fractures of the pelvis, as well as screw fixation of the bilateral sacroiliac joints and left superior pubic ramus. Electronically Signed   By: Marolyn JONETTA Jaksch M.D.   On: 12/03/2023 14:10    Anti-infectives: Anti-infectives (From admission, onward)    Start     Dose/Rate Route Frequency Ordered Stop   10/24/23 1800  ceFEPIme  (MAXIPIME ) 2 g in sodium chloride  0.9 % 100 mL IVPB        2 g 200 mL/hr over 30 Minutes Intravenous Every 12 hours 10/24/23 1108 10/26/23 1813   10/21/23 1400  ceFEPIme  (MAXIPIME ) 2 g in sodium chloride  0.9 % 100 mL IVPB  Status:  Discontinued        2 g 200 mL/hr over 30 Minutes Intravenous Every 8 hours 10/21/23 0844 10/24/23 1108   10/20/23 1900  ceFEPIme  (MAXIPIME ) 2 g in sodium chloride  0.9 % 100 mL IVPB  Status:  Discontinued        2 g 200 mL/hr over 30 Minutes Intravenous Every 12 hours 10/20/23 0912 10/21/23 0844  10/19/23 2300  ceFEPIme  (MAXIPIME ) 2 g in sodium chloride  0.9 % 100 mL IVPB  Status:  Discontinued        2 g 200 mL/hr over 30 Minutes Intravenous Every 8 hours 10/19/23 2157 10/20/23 0912   10/17/23 2200  ceFAZolin  (ANCEF ) IVPB 2g/100 mL premix        2 g 200 mL/hr over 30 Minutes Intravenous Every 8 hours 10/17/23 1541 10/18/23 1417   10/12/23 1745  ceFEPIme  (MAXIPIME ) 2 g in sodium chloride  0.9 % 100 mL IVPB  Status:  Discontinued        2 g 200 mL/hr over 30 Minutes Intravenous Every 8 hours 10/12/23 1319 10/14/23 1037   10/11/23 0945  ceFEPIme  (MAXIPIME )  2 g in sodium chloride  0.9 % 100 mL IVPB  Status:  Discontinued        2 g 200 mL/hr over 30 Minutes Intravenous Every 12 hours 10/11/23 0846 10/12/23 1319        Assessment/Plan PHBC   S/P ex lap, pelvic packing (5 laps), and temporary abdominal closure with vicryl mesh by Dr. Lyndel 11/12 - return to OR 11/13 for ex lap, removal of packs and Abthera placement by Dr. Sebastian S/P ex lap and closure 11/18 Dr. Paola. BID saline WTD to upper portion of midline wound. On regular diet and having bowel function. 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 Vascular reviewed f/u CTA. F/u 1 year.  R acetabular FX, B superior and inferior rami FXs, R iliac FX, L sacral FX - Dr. Sherida consulted, R femur in skeletal traction, S/P SI screw and L acetab perc fixation 11/20 by Dr. Kendal. RLE traction as definitive management for a total of 4 weeks (~12/18).  CT 12/11 showed R hip hematoma. RLE Traction off and WB updated per Dr. Kendal WBAT LLE for transfers. Continue NWB RLE. Repeat xrays done 12/18. Stable for d/c from ortho standpoint. On Vit D. Repeat xrays pending 1/6 - WB status to be updated based on these results.  Pelvic hemorrhage - resolved, S/P IR embolization by Dr. Jennefer 11/12 Acute hypoxic respiratory failure - TRALI, extubated 11/24, guaifenesin  prn for secretions. IS and flutter valve.  Right gluteal hematoma Right psoas hematoma Right 11th rib fracture Multiple lumbar vertebral body transverse process fractures Right scalp hematoma R heel ulcer, appears to be a stage I pressure ulcer - Prevalon boot to right foot while in bed, mepilex dressing.   ID - Completed 7 day course for enterobacter and kleb pna. None currently. FEN - Reg diet. Prosource BID. Agitation -  Klonopin  0.25mg  BID for anxiety. Seroquel  at HS. On Trazodone , Cymbalta , and Hydroxyzine  per psych. Psych recommends  therapy/psychiatry referral upon DC VTE - SCDs, LMWH. ASA 81 mg daily x 30 days for DVT ppx per Ortho  Dispo - Med-surg.  Therapies. LOG SNF. Stable for D/C to SNF once bed available     LOS: 57 days   [name] [name], [name] 12/04/2023, 10:24 AM Please see Amion for pager number during day hours 7:00am-4:30pm

## 2023-12-04 NOTE — Plan of Care (Signed)
  Problem: Clinical Measurements: Goal: Will remain free from infection Outcome: Progressing Goal: Respiratory complications will improve Outcome: Progressing   Problem: Nutrition: Goal: Adequate nutrition will be maintained Outcome: Progressing   Problem: Education: Goal: Verbalization of understanding the information provided (i.e., activity precautions, restrictions, etc) will improve Outcome: Progressing   Problem: Pain Management: Goal: Pain level will decrease Outcome: Progressing

## 2023-12-04 NOTE — Progress Notes (Signed)
 Repeat imaging of the right acetabulum and pelvis from 12/03/2023 have been reviewed.  Imaging stable.  There is been no interval shifting or displacement of fracture fragments.  Plan: Advance to weightbearing as tolerated LLE.  Advance to TDWB RLE.   Lauraine PATRIC Moores PA-C [facility] [phone] (office) [email or web address]
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
## 2023-12-05 NOTE — Progress Notes (Signed)
 PT Cancellation Note  Patient Details Name: [name] [name] MRN: [id number] DOB: 02-Jun-1961   Cancelled Treatment:    Reason Eval/Treat Not Completed: Patient declined, stating he was comfortable in bed and did not want to do PT this late. Educated pt on importance of therapy and continued mobility. Acute PT to follow as able.  Kate ORN, PT, DPT Secure Chat Preferred  Rehab Office [phone]   Kate BRAVO [name] 12/05/2023, 5:04 PM
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
## 2023-12-05 NOTE — Plan of Care (Signed)
  Problem: Clinical Measurements: Goal: Will remain free from infection Outcome: Progressing Goal: Respiratory complications will improve Outcome: Progressing   Problem: Nutrition: Goal: Adequate nutrition will be maintained Outcome: Progressing   Problem: Skin Integrity: Goal: Risk for impaired skin integrity will decrease Outcome: Progressing   Problem: Activity: Goal: Ability to ambulate and perform ADLs will improve Outcome: Progressing

## 2023-12-05 NOTE — Plan of Care (Signed)
 Plan of care is reviewed. Pt has been progressing, He is stable hemodynamically, afebrile, no acute distress overnight. He is able to sleep well with no complaints. Wet to dry dressing was changed by RN day shift yesterday. Dressing is clean and dry, negative for drainage. Pain is well tolerated. We will continue to monitor.   Problem: Clinical Measurements: Goal: Will remain free from infection Outcome: Progressing Goal: Respiratory complications will improve Outcome: Progressing   Problem: Nutrition: Goal: Adequate nutrition will be maintained Outcome: Progressing   Problem: Skin Integrity: Goal: Risk for impaired skin integrity will decrease Outcome: Progressing   Problem: Activity: Goal: Ability to ambulate and perform ADLs will improve Outcome: Progressing   Problem: Pain Management: Goal: Pain level will decrease Outcome: Progressing   Wendi Dash, RN
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
## 2023-12-07 NOTE — Plan of Care (Signed)
  Problem: Clinical Measurements: Goal: Will remain free from infection Outcome: Progressing   Problem: Nutrition: Goal: Adequate nutrition will be maintained Outcome: Progressing   Problem: Activity: Goal: Ability to ambulate and perform ADLs will improve Outcome: Progressing   Problem: Pain Management: Goal: Pain level will decrease Outcome: Progressing
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
## 2023-12-07 NOTE — Progress Notes (Signed)
 Progress Note  51 Days Post-Op  Subjective: Ongoing R thigh spasms, but reports he is improving daily. Tolerating diet and having bowel function. Sitting up in the bathroom with OT getting washed up.   Objective: Vital signs in last 24 hours: Temp:  [97.8 F (36.6 C)-99.1 F (37.3 C)] 99.1 F (37.3 C) (01/10 0710) Pulse Rate:  [94-108] 94 (01/10 0710) Resp:  [16-19] 16 (01/10 0710) BP: (108-131)/(65-89) 123/89 (01/10 0710) SpO2:  [99 %-100 %] 100 % (01/10 0710) Weight:  [64.1 kg] 64.1 kg (01/09 1633) Last BM Date : 12/06/23  Intake/Output from previous day: 01/09 0701 - 01/10 0700 In: 660 [P.O.:660] Out: 1500 [Urine:1500] Intake/Output this shift: No intake/output data recorded.  PE: Gen:  Alert, NAD Card: Reg Pulm: CTA b/l, normal rate and effort  Abd: soft, nondistended, NT, midline dressing C/D/I Ext: No LE edema. R hip without obvious induration or erythema.  Psych: A&Ox3  Lab Results:  No results for input(s): WBC, HGB, HCT, PLT in the last 72 hours. BMET No results for input(s): NA, K, CL, CO2, GLUCOSE, BUN, CREATININE, CALCIUM  in the last 72 hours. PT/INR No results for input(s): LABPROT, INR in the last 72 hours. CMP     Component Value Date/Time   NA 134 (L) 11/11/2023 0545   K 4.1 11/11/2023 0545   CL 93 (L) 11/11/2023 0545   CO2 27 11/11/2023 0545   GLUCOSE 92 11/11/2023 0545   BUN 14 11/11/2023 0545   CREATININE 1.12 11/11/2023 0545   CALCIUM  8.9 11/11/2023 0545   PROT 7.1 10/25/2023 0520   ALBUMIN  2.4 (L) 10/25/2023 0520   AST 47 (H) 10/25/2023 0520   ALT 45 (H) 10/25/2023 0520   ALKPHOS 127 (H) 10/25/2023 0520   BILITOT 2.5 (H) 10/25/2023 0520   GFRNONAA >60 11/11/2023 0545   Lipase  No results found for: LIPASE     Studies/Results: No results found.   Anti-infectives: Anti-infectives (From admission, onward)    Start     Dose/Rate Route Frequency Ordered Stop   10/24/23 1800  ceFEPIme  (MAXIPIME )  2 g in sodium chloride  0.9 % 100 mL IVPB        2 g 200 mL/hr over 30 Minutes Intravenous Every 12 hours 10/24/23 1108 10/26/23 1813   10/21/23 1400  ceFEPIme  (MAXIPIME ) 2 g in sodium chloride  0.9 % 100 mL IVPB  Status:  Discontinued        2 g 200 mL/hr over 30 Minutes Intravenous Every 8 hours 10/21/23 0844 10/24/23 1108   10/20/23 1900  ceFEPIme  (MAXIPIME ) 2 g in sodium chloride  0.9 % 100 mL IVPB  Status:  Discontinued        2 g 200 mL/hr over 30 Minutes Intravenous Every 12 hours 10/20/23 0912 10/21/23 0844   10/19/23 2300  ceFEPIme  (MAXIPIME ) 2 g in sodium chloride  0.9 % 100 mL IVPB  Status:  Discontinued        2 g 200 mL/hr over 30 Minutes Intravenous Every 8 hours 10/19/23 2157 10/20/23 0912   10/17/23 2200  ceFAZolin  (ANCEF ) IVPB 2g/100 mL premix        2 g 200 mL/hr over 30 Minutes Intravenous Every 8 hours 10/17/23 1541 10/18/23 1417   10/12/23 1745  ceFEPIme  (MAXIPIME ) 2 g in sodium chloride  0.9 % 100 mL IVPB  Status:  Discontinued        2 g 200 mL/hr over 30 Minutes Intravenous Every 8 hours 10/12/23 1319 10/14/23 1037   10/11/23 0945  ceFEPIme  (MAXIPIME )  2 g in sodium chloride  0.9 % 100 mL IVPB  Status:  Discontinued        2 g 200 mL/hr over 30 Minutes Intravenous Every 12 hours 10/11/23 0846 10/12/23 1319        Assessment/Plan PHBC   S/P ex lap, pelvic packing (5 laps), and temporary abdominal closure with vicryl mesh by Dr. Lyndel 11/12 - return to OR 11/13 for ex lap, removal of packs and Abthera placement by Dr. Sebastian S/P ex lap and closure 11/18 Dr. Paola. BID saline WTD to upper portion of midline wound. On regular diet and having bowel function. 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 Vascular reviewed f/u CTA. F/u 1 year.  R acetabular FX, B superior and inferior rami FXs, R iliac FX, L sacral FX - Dr. Sherida consulted, R femur in skeletal traction, S/P SI  screw and L acetab perc fixation 11/20 by Dr. Kendal. RLE traction as definitive management for a total of 4 weeks (~12/18).  CT 12/11 showed R hip hematoma. RLE Traction off and WB updated per Dr. Kendal WBAT LLE for transfers, NWB RLE. Repeat xrays done 1/6 > advance to WBAT LLE, TDWB RLE.  Stable for d/c from ortho standpoint. On Vit D. Pelvic hemorrhage - resolved, S/P IR embolization by Dr. Jennefer 11/12 Acute hypoxic respiratory failure - TRALI, extubated 11/24, guaifenesin  prn for secretions. IS and flutter valve.  Right gluteal hematoma Right psoas hematoma Right 11th rib fracture Multiple lumbar vertebral body transverse process fractures Right scalp hematoma R heel ulcer, appears to be a stage I pressure ulcer - Prevalon boot to right foot while in bed, mepilex dressing.   ID - Completed 7 day course for enterobacter and kleb pna. None currently. FEN - Reg diet. Prosource BID. Agitation -  Klonopin  0.25mg  BID for anxiety. Seroquel  at HS. On Trazodone , Cymbalta , and Hydroxyzine  per psych. Psych recommends therapy/psychiatry referral upon DC VTE - SCDs, LMWH. ASA 81 mg daily x 30 days for DVT ppx per Ortho  Dispo - Med-surg.  Therapies. LOG SNF. Stable for D/C to SNF once bed available. WB status updated by ortho 1/7.    LOS: 60 days   [name] [name], [facility] Surgery 12/07/2023, 9:02 AM Please see Amion for pager number during day hours 7:00am-4:30pm
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
## 2023-12-08 MED ORDER — CAPSAICIN 0.025 % EX CREA
TOPICAL_CREAM | Freq: Two times a day (BID) | CUTANEOUS | Status: DC | PRN
Start: 2023-12-08 — End: 2023-12-17
  Filled 2023-12-08: qty 60

## 2023-12-08 NOTE — Plan of Care (Signed)
  Problem: Clinical Measurements: Goal: Will remain free from infection Outcome: Progressing Goal: Respiratory complications will improve Outcome: Progressing   Problem: Nutrition: Goal: Adequate nutrition will be maintained Outcome: Progressing   Problem: Skin Integrity: Goal: Risk for impaired skin integrity will decrease Outcome: Progressing   Problem: Education: Goal: Verbalization of understanding the information provided (i.e., activity precautions, restrictions, etc) will improve Outcome: Progressing Goal: Individualized Educational Video(s) Outcome: Progressing   Problem: Activity: Goal: Ability to ambulate and perform ADLs will improve Outcome: Progressing   Problem: Clinical Measurements: Goal: Postoperative complications will be avoided or minimized Outcome: Progressing   Problem: Self-Concept: Goal: Ability to maintain and perform role responsibilities to the fullest extent possible will improve Outcome: Progressing   Problem: Pain Management: Goal: Pain level will decrease Outcome: Progressing

## 2023-12-08 NOTE — Progress Notes (Signed)
 Progress Note  52 Days Post-Op  Subjective: NAEO  Objective: Vital signs in last 24 hours: Temp:  [98 F (36.7 C)-98.7 F (37.1 C)] 98 F (36.7 C) (01/11 0833) Pulse Rate:  [94-108] 108 (01/11 0833) Resp:  [16-18] 18 (01/11 0833) BP: (122-127)/(84-88) 125/86 (01/11 0833) SpO2:  [99 %-100 %] 99 % (01/11 0833) Last BM Date : 12/07/23  Intake/Output from previous day: 01/10 0701 - 01/11 0700 In: -  Out: 1650 [Urine:1650] Intake/Output this shift: Total I/O In: 240 [P.O.:240] Out: 200 [Urine:200]  PE: Gen:  Alert, NAD Card: Reg Pulm: CTA b/l, normal rate and effort  Abd: soft, nondistended, NT, midline dressing C/D/I Ext: No LE edema. R hip without obvious induration or erythema.  Psych: A&Ox3  Lab Results:  No results for input(s): WBC, HGB, HCT, PLT in the last 72 hours. BMET No results for input(s): NA, K, CL, CO2, GLUCOSE, BUN, CREATININE, CALCIUM  in the last 72 hours. PT/INR No results for input(s): LABPROT, INR in the last 72 hours. CMP     Component Value Date/Time   NA 134 (L) 11/11/2023 0545   K 4.1 11/11/2023 0545   CL 93 (L) 11/11/2023 0545   CO2 27 11/11/2023 0545   GLUCOSE 92 11/11/2023 0545   BUN 14 11/11/2023 0545   CREATININE 1.12 11/11/2023 0545   CALCIUM  8.9 11/11/2023 0545   PROT 7.1 10/25/2023 0520   ALBUMIN  2.4 (L) 10/25/2023 0520   AST 47 (H) 10/25/2023 0520   ALT 45 (H) 10/25/2023 0520   ALKPHOS 127 (H) 10/25/2023 0520   BILITOT 2.5 (H) 10/25/2023 0520   GFRNONAA >60 11/11/2023 0545   Lipase  No results found for: LIPASE     Studies/Results: No results found.   Anti-infectives: Anti-infectives (From admission, onward)    Start     Dose/Rate Route Frequency Ordered Stop   10/24/23 1800  ceFEPIme  (MAXIPIME ) 2 g in sodium chloride  0.9 % 100 mL IVPB        2 g 200 mL/hr over 30 Minutes Intravenous Every 12 hours 10/24/23 1108 10/26/23 1813   10/21/23 1400  ceFEPIme  (MAXIPIME ) 2 g in sodium  chloride 0.9 % 100 mL IVPB  Status:  Discontinued        2 g 200 mL/hr over 30 Minutes Intravenous Every 8 hours 10/21/23 0844 10/24/23 1108   10/20/23 1900  ceFEPIme  (MAXIPIME ) 2 g in sodium chloride  0.9 % 100 mL IVPB  Status:  Discontinued        2 g 200 mL/hr over 30 Minutes Intravenous Every 12 hours 10/20/23 0912 10/21/23 0844   10/19/23 2300  ceFEPIme  (MAXIPIME ) 2 g in sodium chloride  0.9 % 100 mL IVPB  Status:  Discontinued        2 g 200 mL/hr over 30 Minutes Intravenous Every 8 hours 10/19/23 2157 10/20/23 0912   10/17/23 2200  ceFAZolin  (ANCEF ) IVPB 2g/100 mL premix        2 g 200 mL/hr over 30 Minutes Intravenous Every 8 hours 10/17/23 1541 10/18/23 1417   10/12/23 1745  ceFEPIme  (MAXIPIME ) 2 g in sodium chloride  0.9 % 100 mL IVPB  Status:  Discontinued        2 g 200 mL/hr over 30 Minutes Intravenous Every 8 hours 10/12/23 1319 10/14/23 1037   10/11/23 0945  ceFEPIme  (MAXIPIME ) 2 g in sodium chloride  0.9 % 100 mL IVPB  Status:  Discontinued        2 g 200 mL/hr over 30 Minutes Intravenous Every  12 hours 10/11/23 0846 10/12/23 1319        Assessment/Plan PHBC   S/P ex lap, pelvic packing (5 laps), and temporary abdominal closure with vicryl mesh by Dr. Lyndel 11/12 - return to OR 11/13 for ex lap, removal of packs and Abthera placement by Dr. Sebastian S/P ex lap and closure 11/18 Dr. Paola. BID saline WTD to upper portion of midline wound. On regular diet and having bowel function. 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 Vascular reviewed f/u CTA. F/u 1 year.  R acetabular FX, B superior and inferior rami FXs, R iliac FX, L sacral FX - Dr. Sherida consulted, R femur in skeletal traction, S/P SI screw and L acetab perc fixation 11/20 by Dr. Kendal. RLE traction as definitive management for a total of 4 weeks (~12/18).  CT 12/11 showed R hip hematoma. RLE Traction off and WB updated  per Dr. Kendal WBAT LLE for transfers, NWB RLE. Repeat xrays done 1/6 > advance to WBAT LLE, TDWB RLE.  Stable for d/c from ortho standpoint. On Vit D. Pelvic hemorrhage - resolved, S/P IR embolization by Dr. Jennefer 11/12 Acute hypoxic respiratory failure - TRALI, extubated 11/24, guaifenesin  prn for secretions. IS and flutter valve.  Right gluteal hematoma Right psoas hematoma Right 11th rib fracture Multiple lumbar vertebral body transverse process fractures Right scalp hematoma R heel ulcer, appears to be a stage I pressure ulcer - Prevalon boot to right foot while in bed, mepilex dressing.   ID - Completed 7 day course for enterobacter and kleb pna. None currently. FEN - Reg diet. Prosource BID. Agitation -  Klonopin  0.25mg  BID for anxiety. Seroquel  at HS. On Trazodone , Cymbalta , and Hydroxyzine  per psych. Psych recommends therapy/psychiatry referral upon DC VTE - SCDs, LMWH. ASA 81 mg daily x 30 days for DVT ppx per Ortho  Dispo - Med-surg.  Therapies. LOG SNF. Stable for D/C to SNF once bed available. WB status updated by ortho 1/7.    LOS: 61 days   [name] [name], [facility] Surgery 12/08/2023, 10:02 AM Please see Amion for pager number during day hours 7:00am-4:30pm

## 2023-12-09 NOTE — Plan of Care (Signed)
  Problem: Clinical Measurements: Goal: Will remain free from infection Outcome: Progressing   Problem: Nutrition: Goal: Adequate nutrition will be maintained Outcome: Progressing   Problem: Skin Integrity: Goal: Risk for impaired skin integrity will decrease Outcome: Progressing   Problem: Education: Goal: Verbalization of understanding the information provided (i.e., activity precautions, restrictions, etc) will improve Outcome: Progressing   Problem: Activity: Goal: Ability to ambulate and perform ADLs will improve Outcome: Progressing   Problem: Pain Management: Goal: Pain level will decrease Outcome: Progressing

## 2023-12-09 NOTE — Progress Notes (Addendum)
 Progress Note  53 Days Post-Op  Subjective: Ongoing muscle spasms/pain of low back and R hip  Objective: Vital signs in last 24 hours: Temp:  [97.8 F (36.6 C)-98.2 F (36.8 C)] 97.8 F (36.6 C) (01/12 0845) Pulse Rate:  [99-107] 102 (01/12 0845) Resp:  [16-18] 17 (01/12 0845) BP: (118-124)/(73-85) 124/83 (01/12 0845) SpO2:  [98 %-100 %] 98 % (01/12 0845) Last BM Date : 12/07/23  Intake/Output from previous day: 01/11 0701 - 01/12 0700 In: 980 [P.O.:980] Out: 1200 [Urine:1200] Intake/Output this shift: Total I/O In: -  Out: 300 [Urine:300]  PE: Gen:  Alert, NAD Card: Reg Pulm: CTA b/l, normal rate and effort  Abd: soft, nondistended, NT, midline dressing C/D/I Ext: No LE edema. R hip without obvious induration or erythema.  Psych: A&Ox3  Lab Results:  No results for input(s): WBC, HGB, HCT, PLT in the last 72 hours. BMET No results for input(s): NA, K, CL, CO2, GLUCOSE, BUN, CREATININE, CALCIUM  in the last 72 hours. PT/INR No results for input(s): LABPROT, INR in the last 72 hours. CMP     Component Value Date/Time   NA 134 (L) 11/11/2023 0545   K 4.1 11/11/2023 0545   CL 93 (L) 11/11/2023 0545   CO2 27 11/11/2023 0545   GLUCOSE 92 11/11/2023 0545   BUN 14 11/11/2023 0545   CREATININE 1.12 11/11/2023 0545   CALCIUM  8.9 11/11/2023 0545   PROT 7.1 10/25/2023 0520   ALBUMIN  2.4 (L) 10/25/2023 0520   AST 47 (H) 10/25/2023 0520   ALT 45 (H) 10/25/2023 0520   ALKPHOS 127 (H) 10/25/2023 0520   BILITOT 2.5 (H) 10/25/2023 0520   GFRNONAA >60 11/11/2023 0545   Lipase  No results found for: LIPASE     Studies/Results: No results found.   Anti-infectives: Anti-infectives (From admission, onward)    Start     Dose/Rate Route Frequency Ordered Stop   10/24/23 1800  ceFEPIme  (MAXIPIME ) 2 g in sodium chloride  0.9 % 100 mL IVPB        2 g 200 mL/hr over 30 Minutes Intravenous Every 12 hours 10/24/23 1108 10/26/23 1813    10/21/23 1400  ceFEPIme  (MAXIPIME ) 2 g in sodium chloride  0.9 % 100 mL IVPB  Status:  Discontinued        2 g 200 mL/hr over 30 Minutes Intravenous Every 8 hours 10/21/23 0844 10/24/23 1108   10/20/23 1900  ceFEPIme  (MAXIPIME ) 2 g in sodium chloride  0.9 % 100 mL IVPB  Status:  Discontinued        2 g 200 mL/hr over 30 Minutes Intravenous Every 12 hours 10/20/23 0912 10/21/23 0844   10/19/23 2300  ceFEPIme  (MAXIPIME ) 2 g in sodium chloride  0.9 % 100 mL IVPB  Status:  Discontinued        2 g 200 mL/hr over 30 Minutes Intravenous Every 8 hours 10/19/23 2157 10/20/23 0912   10/17/23 2200  ceFAZolin  (ANCEF ) IVPB 2g/100 mL premix        2 g 200 mL/hr over 30 Minutes Intravenous Every 8 hours 10/17/23 1541 10/18/23 1417   10/12/23 1745  ceFEPIme  (MAXIPIME ) 2 g in sodium chloride  0.9 % 100 mL IVPB  Status:  Discontinued        2 g 200 mL/hr over 30 Minutes Intravenous Every 8 hours 10/12/23 1319 10/14/23 1037   10/11/23 0945  ceFEPIme  (MAXIPIME ) 2 g in sodium chloride  0.9 % 100 mL IVPB  Status:  Discontinued        2  g 200 mL/hr over 30 Minutes Intravenous Every 12 hours 10/11/23 0846 10/12/23 1319        Assessment/Plan PHBC   S/P ex lap, pelvic packing (5 laps), and temporary abdominal closure with vicryl mesh by Dr. Lyndel 11/12 - return to OR 11/13 for ex lap, removal of packs and Abthera placement by Dr. Sebastian S/P ex lap and closure 11/18 Dr. Paola. BID saline WTD to upper portion of midline wound. On regular diet and having bowel function. 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 Vascular reviewed f/u CTA. F/u 1 year.  R acetabular FX, B superior and inferior rami FXs, R iliac FX, L sacral FX - Dr. Sherida consulted, R femur in skeletal traction, S/P SI screw and L acetab perc fixation 11/20 by Dr. Kendal. RLE traction as definitive management for a total of 4 weeks (~12/18).  CT 12/11  showed R hip hematoma. RLE Traction off and WB updated per Dr. Kendal WBAT LLE for transfers, NWB RLE. Repeat xrays done 1/6 > advance to WBAT LLE, TDWB RLE.  Stable for d/c from ortho standpoint. On Vit D.  Pelvic hemorrhage - resolved, S/P IR embolization by Dr. Jennefer 11/12 Acute hypoxic respiratory failure - TRALI, extubated 11/24, guaifenesin  prn for secretions. IS and flutter valve.  Right gluteal hematoma Right psoas hematoma Right 11th rib fracture Multiple lumbar vertebral body transverse process fractures Right scalp hematoma R heel ulcer, appears to be a stage I pressure ulcer - Prevalon boot to right foot while in bed, mepilex dressing.   ID - Completed 7 day course for enterobacter and kleb pna. None currently. FEN - Reg diet. Prosource BID. Agitation -  Klonopin  0.25mg  BID for anxiety. Seroquel  at HS. On Trazodone , Cymbalta , and Hydroxyzine  per psych. Psych recommends therapy/psychiatry referral upon DC VTE - SCDs, LMWH. ASA 81 mg daily x 30 days for DVT ppx per Ortho  Dispo - Med-surg.  Therapies. LOG SNF. Stable for D/C to SNF once bed available. WB status updated by ortho 1/7.  Aquathermia for R hip   LOS: 62 days   [name] [name] Pringle, [facility] 12/09/2023, 10:25 AM Please see Amion for pager number during day hours 7:00am-4:30pm

## 2023-12-10 MED ORDER — SILVER NITRATE-POT NITRATE 75-25 % EX MISC
1.0000 | Freq: Once | CUTANEOUS | Status: AC
  Administered 2023-12-10: 1 via TOPICAL
  Filled 2023-12-10: qty 1

## 2023-12-10 NOTE — Plan of Care (Signed)
  Problem: Clinical Measurements: Goal: Will remain free from infection Outcome: Progressing   Problem: Nutrition: Goal: Adequate nutrition will be maintained Outcome: Progressing   Problem: Skin Integrity: Goal: Risk for impaired skin integrity will decrease Outcome: Progressing   Problem: Education: Goal: Verbalization of understanding the information provided (i.e., activity precautions, restrictions, etc) will improve Outcome: Progressing   Problem: Activity: Goal: Ability to ambulate and perform ADLs will improve Outcome: Progressing   Problem: Pain Management: Goal: Pain level will decrease Outcome: Progressing

## 2023-12-10 NOTE — Progress Notes (Signed)
 Progress Note  54 Days Post-Op  Subjective: Gradually improving muscle spasms. Tolerating diet and having bowel function. Anxious to proceed to next venue.   Objective: Vital signs in last 24 hours: Temp:  [97.5 F (36.4 C)-98.7 F (37.1 C)] 98.4 F (36.9 C) (01/13 0803) Pulse Rate:  [96-106] 106 (01/13 0803) Resp:  [14-16] 16 (01/13 0803) BP: (115-121)/(78-91) 116/84 (01/13 0803) SpO2:  [98 %-100 %] 100 % (01/13 0803) Last BM Date : 12/09/23  Intake/Output from previous day: 01/12 0701 - 01/13 0700 In: -  Out: 1950 [Urine:1950] Intake/Output this shift: Total I/O In: -  Out: 750 [Urine:750]  PE: Gen:  Alert, NAD Card: Reg Pulm: CTA b/l, normal rate and effort  Abd: soft, nondistended, NT, midline wound with hypergranulation inferiorly and small area of fibrinous exudate superiorly, stable Ext: No LE edema. R hip without obvious induration or erythema.  Psych: A&Ox3  Lab Results:  No results for input(s): WBC, HGB, HCT, PLT in the last 72 hours. BMET No results for input(s): NA, K, CL, CO2, GLUCOSE, BUN, CREATININE, CALCIUM  in the last 72 hours. PT/INR No results for input(s): LABPROT, INR in the last 72 hours. CMP     Component Value Date/Time   NA 134 (L) 11/11/2023 0545   K 4.1 11/11/2023 0545   CL 93 (L) 11/11/2023 0545   CO2 27 11/11/2023 0545   GLUCOSE 92 11/11/2023 0545   BUN 14 11/11/2023 0545   CREATININE 1.12 11/11/2023 0545   CALCIUM  8.9 11/11/2023 0545   PROT 7.1 10/25/2023 0520   ALBUMIN  2.4 (L) 10/25/2023 0520   AST 47 (H) 10/25/2023 0520   ALT 45 (H) 10/25/2023 0520   ALKPHOS 127 (H) 10/25/2023 0520   BILITOT 2.5 (H) 10/25/2023 0520   GFRNONAA >60 11/11/2023 0545   Lipase  No results found for: LIPASE     Studies/Results: No results found.   Anti-infectives: Anti-infectives (From admission, onward)    Start     Dose/Rate Route Frequency Ordered Stop   10/24/23 1800  ceFEPIme  (MAXIPIME ) 2 g in  sodium chloride  0.9 % 100 mL IVPB        2 g 200 mL/hr over 30 Minutes Intravenous Every 12 hours 10/24/23 1108 10/26/23 1813   10/21/23 1400  ceFEPIme  (MAXIPIME ) 2 g in sodium chloride  0.9 % 100 mL IVPB  Status:  Discontinued        2 g 200 mL/hr over 30 Minutes Intravenous Every 8 hours 10/21/23 0844 10/24/23 1108   10/20/23 1900  ceFEPIme  (MAXIPIME ) 2 g in sodium chloride  0.9 % 100 mL IVPB  Status:  Discontinued        2 g 200 mL/hr over 30 Minutes Intravenous Every 12 hours 10/20/23 0912 10/21/23 0844   10/19/23 2300  ceFEPIme  (MAXIPIME ) 2 g in sodium chloride  0.9 % 100 mL IVPB  Status:  Discontinued        2 g 200 mL/hr over 30 Minutes Intravenous Every 8 hours 10/19/23 2157 10/20/23 0912   10/17/23 2200  ceFAZolin  (ANCEF ) IVPB 2g/100 mL premix        2 g 200 mL/hr over 30 Minutes Intravenous Every 8 hours 10/17/23 1541 10/18/23 1417   10/12/23 1745  ceFEPIme  (MAXIPIME ) 2 g in sodium chloride  0.9 % 100 mL IVPB  Status:  Discontinued        2 g 200 mL/hr over 30 Minutes Intravenous Every 8 hours 10/12/23 1319 10/14/23 1037   10/11/23 0945  ceFEPIme  (MAXIPIME ) 2 g in sodium  chloride 0.9 % 100 mL IVPB  Status:  Discontinued        2 g 200 mL/hr over 30 Minutes Intravenous Every 12 hours 10/11/23 0846 10/12/23 1319        Assessment/Plan PHBC   S/P ex lap, pelvic packing (5 laps), and temporary abdominal closure with vicryl mesh by Dr. Lyndel 11/12 - return to OR 11/13 for ex lap, removal of packs and Abthera placement by Dr. Sebastian S/P ex lap and closure 11/18 Dr. Paola. BID saline WTD to upper portion of midline wound. On regular diet and having bowel function. 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 Vascular reviewed f/u CTA. F/u 1 year.  R acetabular FX, B superior and inferior rami FXs, R iliac FX, L sacral FX - Dr. Sherida consulted, R femur in skeletal traction, S/P  SI screw and L acetab perc fixation 11/20 by Dr. Kendal. RLE traction as definitive management for a total of 4 weeks (~12/18).  CT 12/11 showed R hip hematoma. RLE Traction off and WB updated per Dr. Kendal WBAT LLE for transfers, NWB RLE. Repeat xrays done 1/6 > advance to WBAT LLE, TDWB RLE.  Stable for d/c from ortho standpoint. On Vit D.  Pelvic hemorrhage - resolved, S/P IR embolization by Dr. Jennefer 11/12 Acute hypoxic respiratory failure - TRALI, extubated 11/24, guaifenesin  prn for secretions. IS and flutter valve.  Right gluteal hematoma Right psoas hematoma Right 11th rib fracture Multiple lumbar vertebral body transverse process fractures Right scalp hematoma R heel ulcer, appears to be a stage I pressure ulcer - Prevalon boot to right foot while in bed, mepilex dressing.   ID - Completed 7 day course for enterobacter and kleb pna. None currently. FEN - Reg diet. Prosource BID. Agitation -  Klonopin  0.25mg  BID for anxiety. Seroquel  at HS. On Trazodone , Cymbalta , and Hydroxyzine  per psych. Psych recommends therapy/psychiatry referral upon DC VTE - SCDs, LMWH. ASA 81 mg daily x 30 days for DVT ppx per Ortho  Dispo - Med-surg.  Therapies. LOG SNF. Stable for D/C to SNF once bed available. WB status updated by ortho 1/7.  Aquathermia for R hip   LOS: 63 days   Burnard JONELLE Louder, [facility] 12/10/2023, 9:56 AM Please see Amion for pager number during day hours 7:00am-4:30pm

## 2023-12-10 NOTE — Plan of Care (Signed)
  Problem: Clinical Measurements: Goal: Will remain free from infection Outcome: Progressing   Problem: Nutrition: Goal: Adequate nutrition will be maintained Outcome: Progressing   Problem: Activity: Goal: Ability to ambulate and perform ADLs will improve Outcome: Progressing
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
## 2023-12-11 NOTE — Plan of Care (Signed)
  Problem: Clinical Measurements: Goal: Will remain free from infection Outcome: Progressing Goal: Respiratory complications will improve Outcome: Progressing   Problem: Nutrition: Goal: Adequate nutrition will be maintained Outcome: Progressing   Problem: Skin Integrity: Goal: Risk for impaired skin integrity will decrease Outcome: Progressing
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
## 2023-12-12 MED ORDER — CYCLOBENZAPRINE HCL 10 MG PO TABS
10.0000 mg | ORAL_TABLET | Freq: Three times a day (TID) | ORAL | Status: DC | PRN
  Administered 2023-12-13 – 2023-12-16 (×7): 10 mg via ORAL
  Filled 2023-12-12 (×7): qty 1

## 2023-12-12 NOTE — Progress Notes (Signed)
Mobility Specialist Progress Note:   12/12/23 1207  Mobility  Activity Ambulated with assistance in hallway  Level of Assistance  (MinG)  Assistive Device Front wheel walker  Distance Ambulated (ft) 90 ft  RLE Weight Bearing Per Provider Order TWB  LLE Weight Bearing Per Provider Order WBAT  Activity Response Tolerated well  Mobility Referral Yes  Mobility visit 1 Mobility  Mobility Specialist Start Time (ACUTE ONLY) 1105  Mobility Specialist Stop Time (ACUTE ONLY) 1120  Mobility Specialist Time Calculation (min) (ACUTE ONLY) 15 min     12/12/23 1207  Mobility  Activity Ambulated with assistance in hallway  Level of Assistance  (MinG)  Assistive Device Front wheel walker  Distance Ambulated (ft) 90 ft  RLE Weight Bearing Per Provider Order TWB  LLE Weight Bearing Per Provider Order WBAT  Activity Response Tolerated well  Mobility Referral Yes  Mobility visit 1 Mobility  Mobility Specialist Start Time (ACUTE ONLY) 1105  Mobility Specialist Stop Time (ACUTE ONLY) 1120  Mobility Specialist Time Calculation (min) (ACUTE ONLY) 15 min   Pt received in chair, agreeable to mobility. Pt was able to keep weight bearing precautions throughout session without any hands on assist. Denied any discomfort during ambulation, asx throughout. Pt returned to chair with call bell in reach and all needs met.   Leory Plowman  Mobility Specialist Please contact via [employer] office at [phone]

## 2023-12-12 NOTE — Progress Notes (Addendum)
Dressing change this morning by night shift RN. Dressing c/d/I. Will continue to monitor for any drainage and will change if needed.

## 2023-12-12 NOTE — Plan of Care (Signed)
  Problem: Nutrition: Goal: Adequate nutrition will be maintained Outcome: Completed/Met   Problem: Skin Integrity: Goal: Risk for impaired skin integrity will decrease Outcome: Progressing   Problem: Activity: Goal: Ability to ambulate and perform ADLs will improve Outcome: Progressing   Problem: Pain Management: Goal: Pain level will decrease Outcome: Progressing
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
## 2023-12-13 NOTE — Progress Notes (Signed)
Progress Note  57 Days Post-Op  Subjective: RLE muscle spasms well controlled with PRN flexeril. Tolerating diet and having bowel function. Voiding.   Objective: Vital signs in last 24 hours: Temp:  [98.3 F (36.8 C)-98.4 F (36.9 C)] 98.3 F (36.8 C) (01/16 0720) Pulse Rate:  [95-108] 98 (01/16 0720) Resp:  [16-18] 18 (01/16 0720) BP: (119-138)/(83-91) 119/89 (01/16 0720) SpO2:  [99 %-100 %] 100 % (01/16 0720) Weight:  [62 kg] 62 kg (01/15 1231) Last BM Date : 12/13/23  Intake/Output from previous day: 01/15 0701 - 01/16 0700 In: 240 [P.O.:240] Out: 600 [Urine:600] Intake/Output this shift: Total I/O In: 480 [P.O.:480] Out: 300 [Urine:300]  PE: Gen:  Alert, NAD Card: Reg Pulm: CTA b/l, normal rate and effort  Abd: soft, nondistended, NT, midline wound with improved hypergranulation inferiorly and small area of fibrinous exudate superiorly that appears stable Ext: No LE edema.  Psych: A&Ox3  Lab Results:  No results for input(s): "WBC", "HGB", "HCT", "PLT" in the last 72 hours. BMET No results for input(s): "NA", "K", "CL", "CO2", "GLUCOSE", "BUN", "CREATININE", "CALCIUM" in the last 72 hours. PT/INR No results for input(s): "LABPROT", "INR" in the last 72 hours. CMP     Component Value Date/Time   NA 134 (L) 11/11/2023 0545   K 4.1 11/11/2023 0545   CL 93 (L) 11/11/2023 0545   CO2 27 11/11/2023 0545   GLUCOSE 92 11/11/2023 0545   BUN 14 11/11/2023 0545   CREATININE 1.12 11/11/2023 0545   CALCIUM 8.9 11/11/2023 0545   PROT 7.1 10/25/2023 0520   ALBUMIN 2.4 (L) 10/25/2023 0520   AST 47 (H) 10/25/2023 0520   ALT 45 (H) 10/25/2023 0520   ALKPHOS 127 (H) 10/25/2023 0520   BILITOT 2.5 (H) 10/25/2023 0520   GFRNONAA >60 11/11/2023 0545   Lipase  No results found for: "LIPASE"     Studies/Results: No results found.   Anti-infectives: Anti-infectives (From admission, onward)    Start     Dose/Rate Route Frequency Ordered Stop   10/24/23 1800   ceFEPIme (MAXIPIME) 2 g in sodium chloride 0.9 % 100 mL IVPB        2 g 200 mL/hr over 30 Minutes Intravenous Every 12 hours 10/24/23 1108 10/26/23 1813   10/21/23 1400  ceFEPIme (MAXIPIME) 2 g in sodium chloride 0.9 % 100 mL IVPB  Status:  Discontinued        2 g 200 mL/hr over 30 Minutes Intravenous Every 8 hours 10/21/23 0844 10/24/23 1108   10/20/23 1900  ceFEPIme (MAXIPIME) 2 g in sodium chloride 0.9 % 100 mL IVPB  Status:  Discontinued        2 g 200 mL/hr over 30 Minutes Intravenous Every 12 hours 10/20/23 0912 10/21/23 0844   10/19/23 2300  ceFEPIme (MAXIPIME) 2 g in sodium chloride 0.9 % 100 mL IVPB  Status:  Discontinued        2 g 200 mL/hr over 30 Minutes Intravenous Every 8 hours 10/19/23 2157 10/20/23 0912   10/17/23 2200  ceFAZolin (ANCEF) IVPB 2g/100 mL premix        2 g 200 mL/hr over 30 Minutes Intravenous Every 8 hours 10/17/23 1541 10/18/23 1417   10/12/23 1745  ceFEPIme (MAXIPIME) 2 g in sodium chloride 0.9 % 100 mL IVPB  Status:  Discontinued        2 g 200 mL/hr over 30 Minutes Intravenous Every 8 hours 10/12/23 1319 10/14/23 1037   10/11/23 0945  ceFEPIme (MAXIPIME) 2  g in sodium chloride 0.9 % 100 mL IVPB  Status:  Discontinued        2 g 200 mL/hr over 30 Minutes Intravenous Every 12 hours 10/11/23 0846 10/12/23 1319        Assessment/Plan PHBC   S/P ex lap, pelvic packing (5 laps), and temporary abdominal closure with vicryl mesh by Dr. Dossie Der 11/12 - return to OR 11/13 for ex lap, removal of packs and Abthera placement by Dr. Janee Morn S/P ex lap and closure 11/18 Dr. Bedelia Person. BID saline WTD to upper portion of midline wound. On regular diet and having bowel function. - silver nitrate applied to inferior portion of incision for hypergranulation 1/13 and 1/14 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 Vascular reviewed f/u CTA. F/u 1 year.  R acetabular FX, B superior and inferior rami FXs, R iliac FX, L sacral FX - Dr. Hulda Humphrey consulted, R femur in  skeletal traction, S/P SI screw and L acetab perc fixation 11/20 by Dr. Jena Gauss. RLE traction as definitive management for a total of 4 weeks (~12/18).  CT 12/11 showed R hip hematoma. RLE Traction off and WB updated per Dr. Jena Gauss WBAT LLE for transfers, NWB RLE. Repeat xrays done 1/6 > advance to WBAT LLE, TDWB RLE.  Stable for d/c from ortho standpoint. On Vit D.  Pelvic hemorrhage - resolved, S/P IR embolization by Dr. Elby Showers 11/12 Acute hypoxic respiratory failure - TRALI, extubated 11/24, guaifenesin prn for secretions. IS and flutter valve.  Right gluteal hematoma Right psoas hematoma Right 11th rib fracture Multiple lumbar vertebral body transverse process fractures Right scalp hematoma R heel ulcer, appears to be a stage I pressure ulcer - Prevalon boot to right foot while in bed, mepilex dressing.   ID - Completed 7 day course for enterobacter and kleb pna. None currently. FEN - Reg diet. Prosource BID. Agitation -  Klonopin 0.25mg  BID for anxiety. Seroquel at HS. On Trazodone, Cymbalta, and Hydroxyzine per psych. Psych recommends therapy/psychiatry referral upon DC VTE - SCDs, LMWH. ASA 81 mg daily x 30 days for DVT ppx per Ortho  Dispo - Med-surg.  Therapies. LOG SNF. Stable for D/C to SNF once bed available. WB status updated by ortho 1/7. Aquathermia for R hip   If unable to secure SNF, may consider d/c home with HH vs OP therapies. Patient mobility improving - on 1/15 he was able to ambulate 148ft with RW at supervision level and navigate 3 stairs at [facility] level. He lives at home alone with 3 stairs to enter his home. His living space and bathroom are on the first floor. He reports he could have support from his Mother and Celine Ahr a few hours a day. If needs transportation to [place], he believes his Aunt would be able to drive him to appointments. Will discuss with TOC at multidisciplinary rounds today.    LOS: 66 days   [name] [name], [facility] 12/13/2023,  8:52 AM Please see Amion for pager number during day hours 7:00am-4:30pm

## 2023-12-13 NOTE — Plan of Care (Signed)
  Problem: Clinical Measurements: Goal: Will remain free from infection Outcome: Progressing Goal: Respiratory complications will improve Outcome: Progressing   Problem: Skin Integrity: Goal: Risk for impaired skin integrity will decrease Outcome: Progressing   Problem: Education: Goal: Verbalization of understanding the information provided (i.e., activity precautions, restrictions, etc) will improve Outcome: Progressing Goal: Individualized Educational Video(s) Outcome: Progressing   Problem: Activity: Goal: Ability to ambulate and perform ADLs will improve Outcome: Progressing   Problem: Clinical Measurements: Goal: Postoperative complications will be avoided or minimized Outcome: Progressing   Problem: Self-Concept: Goal: Ability to maintain and perform role responsibilities to the fullest extent possible will improve Outcome: Progressing   Problem: Pain Management: Goal: Pain level will decrease Outcome: Progressing

## 2023-12-13 NOTE — TOC CM/SW Note (Signed)
       RE:         Date of Birth:       Date:          To Whom It May Concern:  Please be advised that the above-named patient will require a short-term nursing home stay - anticipated 30 days or less for rehabilitation and strengthening.  The plan is for return home.                 MD signature                Date 

## 2023-12-13 NOTE — TOC Progression Note (Signed)
Transition of Care [facility]) - Progression Note    Patient Details  Name: [name] [name] MRN: [id number] Date of Birth: 04/23/1961  Transition of Care [facility]) CM/SW Contact  Glennon Mac, RN Phone Number: 12/13/2023, 2:00pm  Clinical Narrative:    Requested re-review from Reyne Dumas with [facility].  She states patient may be appropriate for [facility] in [place], [place]. She asks that referral be resent and routed to [facility], with the understanding that case will be with Letter of Guarantee backing. Referral resent as requested; Tammy visited patient while here at hospital.    Await facility review; will provide updates as they are available.    Expected Discharge Plan: Skilled Nursing Facility Barriers to Discharge: Continued Medical Work up  Expected Discharge Plan and Services   Discharge Planning Services: CM Consult                                           Social Determinants of Health (SDOH) Interventions SDOH Screenings   Food Insecurity: Patient Unable To Answer (10/18/2023)  Housing: Patient Unable To Answer (10/18/2023)  Transportation Needs: Patient Unable To Answer (10/18/2023)  Utilities: Patient Unable To Answer (10/18/2023)  Tobacco Use: Low Risk  (11/18/2023)    Readmission Risk Interventions     No data to display         Quintella Baton, RN, BSN  Trauma/Neuro ICU Case Manager [phone]

## 2023-12-13 NOTE — Progress Notes (Addendum)
Mobility Specialist Progress Note:   12/13/23 1121  Mobility  Activity Ambulated with assistance in hallway  Level of Assistance Standby assist, set-up cues, supervision of patient - no hands on  Assistive Device Front wheel walker  Distance Ambulated (ft) 100 ft  RLE Weight Bearing Per Provider Order TWB  LLE Weight Bearing Per Provider Order WBAT  Activity Response Tolerated well  Mobility Referral Yes  Mobility visit 1 Mobility  Mobility Specialist Start Time (ACUTE ONLY) 1100  Mobility Specialist Stop Time (ACUTE ONLY) 1112  Mobility Specialist Time Calculation (min) (ACUTE ONLY) 12 min   Pt received in bed, agreeable to mobility. C/o lower back and RLE muscle spasms this morning but pt denied any discomfort or pain during session. Pt was able to maintain precautions with all OOB activity. Pt left in chair with call bell in reach and all needs met.   Leory Plowman  Mobility Specialist Please contact via [employer] office at [phone]

## 2023-12-13 NOTE — [place] FL2 (Signed)
[place] MEDICAID FL2 LEVEL OF CARE FORM     IDENTIFICATION  Patient Name: Bowden Zills Birthdate: 07/29/1961 Sex: male Admission Date (Current Location): 10/08/2023  [place] and [place] Number:  [name] [id number] T Facility and Address:  [facility], [address] [place], [place] [address]      Provider Number: [id number]  Attending Physician Name and Address:  Dr. Violeta Gelinas, MD Relative Name and Phone Number:  Sumit Torosian, mother: phone: [phone]    Current Level of Care: SNF Recommended Level of Care: Skilled Nursing Facility Prior Approval Number:    Date Approved/Denied:   PASRR Number: Pending  Discharge Plan: SNF    Current Diagnoses: Patient Active Problem List   Diagnosis Date Noted   Malnutrition of moderate degree 11/04/2023   Acute stress disorder 11/04/2023   Status post surgery 10/08/2023    Orientation RESPIRATION BLADDER Height & Weight     Self, Time, Place, Situation  Normal Continent Weight: 62 kg Height:  5\' 6"  (167.6 cm)  BEHAVIORAL SYMPTOMS/MOOD NEUROLOGICAL BOWEL NUTRITION STATUS      Continent Diet (reg, thin liquids)  AMBULATORY STATUS COMMUNICATION OF NEEDS Skin   Extensive Assist Verbally Surgical wounds (midline wound wet to dry drsg BID)                       Personal Care Assistance Level of Assistance  Bathing, Feeding, Dressing Bathing Assistance: Limited assistance Feeding assistance: Independent Dressing Assistance: Limited assistance     Functional Limitations Info             SPECIAL CARE FACTORS FREQUENCY  Speech therapy     PT Frequency: 5x weekly OT Frequency: 5x weekly     Speech Therapy Frequency: 5x weekly      Contractures Contractures Info: Not present    Additional Factors Info  Code Status, Allergies Code Status Info: Full Allergies Info: NKDA           Current Medications (12/13/2023):  This is the current hospital active medication  list Current Facility-Administered Medications  Medication Dose Route Frequency Provider Last Rate Last Admin   (feeding supplement) PROSource Plus liquid 30 mL  30 mL Oral BID BM Violeta Gelinas, MD   30 mL at 12/13/23 1402   acetaminophen (TYLENOL) tablet 1,000 mg  1,000 mg Oral Q6H Barnetta Chapel, PA-C   1,000 mg at 12/13/23 1222   aspirin EC tablet 81 mg  81 mg Oral Daily Emilie Rutter, PA-C   81 mg at 12/13/23 1025   bethanechol (URECHOLINE) tablet 5 mg  5 mg Oral TID Barnetta Chapel, PA-C   5 mg at 12/13/23 1556   bisacodyl (DULCOLAX) suppository 10 mg  10 mg Rectal Daily PRN Fritzi Mandes, MD       capsaicin (ZOSTRIX) 0.025 % cream   Topical BID PRN Adam Phenix, PA-C       clonazePAM (KLONOPIN) disintegrating tablet 0.25 mg  0.25 mg Oral BID Sophronia Simas L, MD   0.25 mg at 12/13/23 1027   cyclobenzaprine (FLEXERIL) tablet 10 mg  10 mg Oral TID PRN Jacinto Halim, PA-C   10 mg at 12/13/23 1556   docusate sodium (COLACE) capsule 100 mg  100 mg Oral BID Reome, Earle J, RPH   100 mg at 12/13/23 1026   DULoxetine (CYMBALTA) DR capsule 60 mg  60 mg Oral Daily Meryl Dare, MD   60 mg at 12/13/23 1027   enoxaparin (LOVENOX) injection 30  mg  30 mg Subcutaneous Q12H West Bali, PA-C   30 mg at 12/13/23 1024   feeding supplement (ENSURE ENLIVE / ENSURE PLUS) liquid 237 mL  237 mL Oral BID BM Diamantina Monks, MD   237 mL at 12/13/23 1402   folic acid (FOLVITE) tablet 1 mg  1 mg Oral Daily Barnetta Chapel, PA-C   1 mg at 12/13/23 1025   gabapentin (NEURONTIN) capsule 200 mg  200 mg Oral TID Jacinto Halim, PA-C   200 mg at 12/13/23 1556   guaiFENesin (ROBITUSSIN) 100 MG/5ML liquid 10 mL  10 mL Oral Q6H PRN Fritzi Mandes, MD       hydrALAZINE (APRESOLINE) injection 10 mg  10 mg Intravenous Q6H PRN West Bali, PA-C   10 mg at 10/15/23 1813   hydrocortisone cream 1 %   Topical BID Darnell Level, MD   Given at 12/12/23 2205   hydrOXYzine (ATARAX) tablet 25 mg  25 mg Oral  BID PRN Meryl Dare, MD   25 mg at 12/01/23 1224   ibuprofen (ADVIL) tablet 600 mg  600 mg Oral Q6H PRN Joaquim Nam, RPH   600 mg at 11/29/23 0749   ipratropium-albuterol (DUONEB) 0.5-2.5 (3) MG/3ML nebulizer solution 3 mL  3 mL Nebulization Q6H PRN Maczis, Elmer Sow, PA-C       lidocaine (LIDODERM) 5 % 2 patch  2 patch Transdermal Q24H Adam Phenix, PA-C   2 patch at 12/13/23 1027   melatonin tablet 3 mg  3 mg Oral QHS Barnetta Chapel, PA-C   3 mg at 12/12/23 2201   metoprolol tartrate (LOPRESSOR) injection 5 mg  5 mg Intravenous Q6H PRN West Bali, PA-C   2.5 mg at 10/15/23 1542   multivitamin with minerals tablet 1 tablet  1 tablet Oral Daily Barnetta Chapel, PA-C   1 tablet at 12/13/23 1025   ondansetron (ZOFRAN) injection 4 mg  4 mg Intravenous Q8H PRN Fritzi Mandes, MD   4 mg at 11/09/23 1103   Oral care mouth rinse  15 mL Mouth Rinse PRN Cornett, Thomas, MD       oxyCODONE (Oxy IR/ROXICODONE) immediate release tablet 5-10 mg  5-10 mg Oral Q4H PRN Barnetta Chapel, PA-C   5 mg at 12/11/23 0428   polyethylene glycol (MIRALAX / GLYCOLAX) packet 17 g  17 g Oral BID Jacinto Halim, PA-C   17 g at 12/13/23 1023   QUEtiapine (SEROQUEL) tablet 50 mg  50 mg Oral QHS Barnetta Chapel, PA-C   50 mg at 12/12/23 2201   sucralfate (CARAFATE) tablet 1 g  1 g Oral TID WC & HS Reome, Earle J, RPH   1 g at 12/13/23 1222   tamsulosin (FLOMAX) capsule 0.4 mg  0.4 mg Oral Daily Barnetta Chapel, PA-C   0.4 mg at 12/13/23 1025   thiamine (VITAMIN B1) tablet 100 mg  100 mg Oral Daily Barnetta Chapel, PA-C   100 mg at 12/13/23 1027   Or   thiamine (VITAMIN B1) injection 100 mg  100 mg Intravenous Daily Barnetta Chapel, PA-C       traZODone (DESYREL) tablet 50 mg  50 mg Oral QHS Akintayo, Musa A, MD   50 mg at 12/12/23 2204     Discharge Medications: Please see discharge summary for a list of discharge medications.  Relevant Imaging Results:  Relevant Lab Results:   Additional  Information SSN [id number]  Glennon Mac, RN

## 2023-12-14 NOTE — TOC Progression Note (Signed)
Transition of Care [facility]) - Progression Note    Patient Details  Name: [name] [name] MRN: [id number] Date of Birth: [birth year]  Transition of Care [facility]) CM/SW Contact  Glennon Mac, RN Phone Number: 12/14/2023, 4:51 PM  Clinical Narrative:    Patient has been accepted for admission to [facility] in [place], [place] with letter of guarantee.  Have arranged for discharge on Monday with transport by [facility]AR; pickup at 12 noon.  Patient is agreeable to all arrangements and appreciates assistance.  He plans to have family bring clothes for him and possibly a phone over the weekend.  Patient provided name, address and phone number of facility.  Provider notified of plan for transfer to facility on Monday.    TOC Trauma Case Manager will follow up on Monday AM with further details and updates.    Expected Discharge Plan: Skilled Nursing Facility Barriers to Discharge: Continued Medical Work up  Expected Discharge Plan and Services   Discharge Planning Services: CM Consult                                           Social Determinants of Health (SDOH) Interventions SDOH Screenings   Food Insecurity: Patient Unable To Answer (10/18/2023)  Housing: Patient Unable To Answer (10/18/2023)  Transportation Needs: Patient Unable To Answer (10/18/2023)  Utilities: Patient Unable To Answer (10/18/2023)  Tobacco Use: Low Risk  (11/18/2023)    Readmission Risk Interventions     No data to display         Quintella Baton, RN, BSN  Trauma/Neuro ICU Case Manager [phone]

## 2023-12-14 NOTE — [place] FL2 (Signed)
[place] MEDICAID [id number] LEVEL OF CARE FORM     IDENTIFICATION  Patient Name: [name] [name] Birthdate: Jan 23, 1961 Sex: male Admission Date (Current Location): 10/08/2023  [place] and [place] Number:  [name] [id number] Facility and Address:  [facility], [address] [place], [place] [address]      Provider Number: [id number]  Attending Physician Name and Address:  Md, Trauma, MD  Relative Name and Phone Number:  Kace Gruenwald, mother: phone: [phone]    Current Level of Care: SNF Recommended Level of Care: Skilled Nursing Facility Prior Approval Number:    Date Approved/Denied:   PASRR Number: [id number]  Discharge Plan: SNF    Current Diagnoses: Patient Active Problem List   Diagnosis Date Noted   Malnutrition of moderate degree 11/04/2023   Acute stress disorder 11/04/2023   Status post surgery 10/08/2023    Orientation RESPIRATION BLADDER Height & Weight     Self, Time, Place, Situation  Normal Continent Weight: 62 kg Height:  5\' 6"  (167.6 cm)  BEHAVIORAL SYMPTOMS/MOOD NEUROLOGICAL BOWEL NUTRITION STATUS      Continent Diet (reg, thin liquids)  AMBULATORY STATUS COMMUNICATION OF NEEDS Skin   Extensive Assist Verbally Surgical wounds (midline wound wet to dry drsg BID)                       Personal Care Assistance Level of Assistance  Bathing, Feeding, Dressing Bathing Assistance: Limited assistance Feeding assistance: Independent Dressing Assistance: Limited assistance     Functional Limitations Info             SPECIAL CARE FACTORS FREQUENCY  Speech therapy     PT Frequency: 5x weekly OT Frequency: 5x weekly     Speech Therapy Frequency: 5x weekly      Contractures Contractures Info: Not present    Additional Factors Info  Code Status, Allergies Code Status Info: Full Allergies Info: NKDA           Current Medications (12/14/2023):  This is the current hospital active medication list Current  Facility-Administered Medications  Medication Dose Route Frequency Provider Last Rate Last Admin   (feeding supplement) PROSource Plus liquid 30 mL  30 mL Oral BID BM Violeta Gelinas, MD   30 mL at 12/14/23 1442   acetaminophen (TYLENOL) tablet 1,000 mg  1,000 mg Oral Q6H Barnetta Chapel, PA-C   1,000 mg at 12/14/23 1112   aspirin EC tablet 81 mg  81 mg Oral Daily Emilie Rutter, PA-C   81 mg at 12/14/23 4010   bethanechol (URECHOLINE) tablet 5 mg  5 mg Oral TID Barnetta Chapel, PA-C   5 mg at 12/14/23 2725   bisacodyl (DULCOLAX) suppository 10 mg  10 mg Rectal Daily PRN Fritzi Mandes, MD       capsaicin (ZOSTRIX) 0.025 % cream   Topical BID PRN Adam Phenix, PA-C       clonazePAM (KLONOPIN) disintegrating tablet 0.25 mg  0.25 mg Oral BID Sophronia Simas L, MD   0.25 mg at 12/14/23 3664   cyclobenzaprine (FLEXERIL) tablet 10 mg  10 mg Oral TID PRN Jacinto Halim, PA-C   10 mg at 12/13/23 2313   docusate sodium (COLACE) capsule 100 mg  100 mg Oral BID Reome, Earle J, RPH   100 mg at 12/14/23 0826   DULoxetine (CYMBALTA) DR capsule 60 mg  60 mg Oral Daily Meryl Dare, MD   60 mg at 12/14/23 0826   enoxaparin (LOVENOX) injection 30  mg  30 mg Subcutaneous Q12H West Bali, PA-C   30 mg at 12/14/23 0825   feeding supplement (ENSURE ENLIVE / ENSURE PLUS) liquid 237 mL  237 mL Oral BID BM Diamantina Monks, MD   237 mL at 12/14/23 1442   folic acid (FOLVITE) tablet 1 mg  1 mg Oral Daily Barnetta Chapel, PA-C   1 mg at 12/14/23 9629   gabapentin (NEURONTIN) capsule 200 mg  200 mg Oral TID Jacinto Halim, PA-C   200 mg at 12/14/23 5284   guaiFENesin (ROBITUSSIN) 100 MG/5ML liquid 10 mL  10 mL Oral Q6H PRN Fritzi Mandes, MD       hydrALAZINE (APRESOLINE) injection 10 mg  10 mg Intravenous Q6H PRN [place] Bali, PA-C   10 mg at 10/15/23 1813   hydrocortisone cream 1 %   Topical BID Darnell Level, MD   Given at 12/14/23 1324   hydrOXYzine (ATARAX) tablet 25 mg  25 mg Oral BID PRN  Meryl Dare, MD   25 mg at 12/01/23 1224   ibuprofen (ADVIL) tablet 600 mg  600 mg Oral Q6H PRN Joaquim Nam, RPH   600 mg at 11/29/23 0749   ipratropium-albuterol (DUONEB) 0.5-2.5 (3) MG/3ML nebulizer solution 3 mL  3 mL Nebulization Q6H PRN Maczis, Elmer Sow, PA-C       lidocaine (LIDODERM) 5 % 2 patch  2 patch Transdermal Q24H Adam Phenix, PA-C   2 patch at 12/14/23 0827   melatonin tablet 3 mg  3 mg Oral QHS Barnetta Chapel, PA-C   3 mg at 12/13/23 2114   metoprolol tartrate (LOPRESSOR) injection 5 mg  5 mg Intravenous Q6H PRN West Bali, PA-C   2.5 mg at 10/15/23 1542   multivitamin with minerals tablet 1 tablet  1 tablet Oral Daily Barnetta Chapel, PA-C   1 tablet at 12/14/23 0826   ondansetron (ZOFRAN) injection 4 mg  4 mg Intravenous Q8H PRN Fritzi Mandes, MD   4 mg at 11/09/23 1103   Oral care mouth rinse  15 mL Mouth Rinse PRN Cornett, Thomas, MD       oxyCODONE (Oxy IR/ROXICODONE) immediate release tablet 5-10 mg  5-10 mg Oral Q4H PRN Barnetta Chapel, PA-C   5 mg at 12/11/23 0428   polyethylene glycol (MIRALAX / GLYCOLAX) packet 17 g  17 g Oral BID Jacinto Halim, PA-C   17 g at 12/14/23 0827   QUEtiapine (SEROQUEL) tablet 50 mg  50 mg Oral QHS Barnetta Chapel, PA-C   50 mg at 12/13/23 2113   sucralfate (CARAFATE) tablet 1 g  1 g Oral TID WC & HS Reome, Earle J, RPH   1 g at 12/14/23 1112   tamsulosin (FLOMAX) capsule 0.4 mg  0.4 mg Oral Daily Barnetta Chapel, PA-C   0.4 mg at 12/14/23 4010   thiamine (VITAMIN B1) tablet 100 mg  100 mg Oral Daily Barnetta Chapel, PA-C   100 mg at 12/14/23 2725   Or   thiamine (VITAMIN B1) injection 100 mg  100 mg Intravenous Daily Barnetta Chapel, PA-C       traZODone (DESYREL) tablet 50 mg  50 mg Oral QHS Akintayo, Musa A, MD   50 mg at 12/13/23 2113     Discharge Medications: Please see discharge summary for a list of discharge medications.  Relevant Imaging Results:  Relevant Lab Results:   Additional Information SSN  [id number]  Glennon Mac, RN

## 2023-12-14 NOTE — Plan of Care (Signed)
  Problem: Clinical Measurements: Goal: Will remain free from infection Outcome: Progressing Goal: Respiratory complications will improve Outcome: Progressing   Problem: Skin Integrity: Goal: Risk for impaired skin integrity will decrease Outcome: Progressing   Problem: Education: Goal: Verbalization of understanding the information provided (i.e., activity precautions, restrictions, etc) will improve Outcome: Progressing Goal: Individualized Educational Video(s) Outcome: Progressing   Problem: Activity: Goal: Ability to ambulate and perform ADLs will improve Outcome: Progressing   Problem: Clinical Measurements: Goal: Postoperative complications will be avoided or minimized Outcome: Progressing   Problem: Self-Concept: Goal: Ability to maintain and perform role responsibilities to the fullest extent possible will improve Outcome: Progressing   Problem: Pain Management: Goal: Pain level will decrease Outcome: Progressing
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
## 2023-12-14 NOTE — Progress Notes (Signed)
Progress Note  58 Days Post-Op  Subjective: RLE muscle spasms well controlled with PRN flexeril. Tolerating diet and having bowel function. Voiding.   Objective: Vital signs in last 24 hours: Temp:  [98 F (36.7 C)-98.6 F (37 C)] 98.5 F (36.9 C) (01/17 0832) Pulse Rate:  [93-108] 101 (01/17 0832) Resp:  [16-20] 20 (01/17 0832) BP: (120-136)/(84-92) 120/84 (01/17 0832) SpO2:  [97 %-100 %] 100 % (01/17 0832) Last BM Date : 12/13/23  Intake/Output from previous day: 01/16 0701 - 01/17 0700 In: 1120 [P.O.:1120] Out: 2300 [Urine:2300] Intake/Output this shift: No intake/output data recorded.  PE: Gen:  Alert, NAD Card: Reg Pulm: CTA b/l, normal rate and effort  Abd: soft, nondistended, NT, midline wound with improved hypergranulation inferiorly and small area of fibrinous exudate superiorly that appears stable Ext: No LE edema.  Psych: A&Ox3  Lab Results:  No results for input(s): "WBC", "HGB", "HCT", "PLT" in the last 72 hours. BMET No results for input(s): "NA", "K", "CL", "CO2", "GLUCOSE", "BUN", "CREATININE", "CALCIUM" in the last 72 hours. PT/INR No results for input(s): "LABPROT", "INR" in the last 72 hours. CMP     Component Value Date/Time   NA 134 (L) 11/11/2023 0545   K 4.1 11/11/2023 0545   CL 93 (L) 11/11/2023 0545   CO2 27 11/11/2023 0545   GLUCOSE 92 11/11/2023 0545   BUN 14 11/11/2023 0545   CREATININE 1.12 11/11/2023 0545   CALCIUM 8.9 11/11/2023 0545   PROT 7.1 10/25/2023 0520   ALBUMIN 2.4 (L) 10/25/2023 0520   AST 47 (H) 10/25/2023 0520   ALT 45 (H) 10/25/2023 0520   ALKPHOS 127 (H) 10/25/2023 0520   BILITOT 2.5 (H) 10/25/2023 0520   GFRNONAA >60 11/11/2023 0545   Lipase  No results found for: "LIPASE"     Studies/Results: No results found.   Anti-infectives: Anti-infectives (From admission, onward)    Start     Dose/Rate Route Frequency Ordered Stop   10/24/23 1800  ceFEPIme (MAXIPIME) 2 g in sodium chloride 0.9 % 100 mL  IVPB        2 g 200 mL/hr over 30 Minutes Intravenous Every 12 hours 10/24/23 1108 10/26/23 1813   10/21/23 1400  ceFEPIme (MAXIPIME) 2 g in sodium chloride 0.9 % 100 mL IVPB  Status:  Discontinued        2 g 200 mL/hr over 30 Minutes Intravenous Every 8 hours 10/21/23 0844 10/24/23 1108   10/20/23 1900  ceFEPIme (MAXIPIME) 2 g in sodium chloride 0.9 % 100 mL IVPB  Status:  Discontinued        2 g 200 mL/hr over 30 Minutes Intravenous Every 12 hours 10/20/23 0912 10/21/23 0844   10/19/23 2300  ceFEPIme (MAXIPIME) 2 g in sodium chloride 0.9 % 100 mL IVPB  Status:  Discontinued        2 g 200 mL/hr over 30 Minutes Intravenous Every 8 hours 10/19/23 2157 10/20/23 0912   10/17/23 2200  ceFAZolin (ANCEF) IVPB 2g/100 mL premix        2 g 200 mL/hr over 30 Minutes Intravenous Every 8 hours 10/17/23 1541 10/18/23 1417   10/12/23 1745  ceFEPIme (MAXIPIME) 2 g in sodium chloride 0.9 % 100 mL IVPB  Status:  Discontinued        2 g 200 mL/hr over 30 Minutes Intravenous Every 8 hours 10/12/23 1319 10/14/23 1037   10/11/23 0945  ceFEPIme (MAXIPIME) 2 g in sodium chloride 0.9 % 100 mL IVPB  Status:  Discontinued        2 g 200 mL/hr over 30 Minutes Intravenous Every 12 hours 10/11/23 0846 10/12/23 1319        Assessment/Plan PHBC   S/P ex lap, pelvic packing (5 laps), and temporary abdominal closure with vicryl mesh by Dr. Dossie Der 11/12 - return to OR 11/13 for ex lap, removal of packs and Abthera placement by Dr. Janee Morn S/P ex lap and closure 11/18 Dr. Bedelia Person. BID saline WTD to upper portion of midline wound. On regular diet and having bowel function. - silver nitrate applied to inferior portion of incision for hypergranulation 1/13 and 1/14 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 Vascular reviewed f/u CTA. F/u 1 year.  R acetabular FX, B superior and inferior rami FXs, R iliac FX, L sacral FX - Dr. Hulda Humphrey consulted, R femur in skeletal traction, S/P SI screw and L acetab perc  fixation 11/20 by Dr. Jena Gauss. RLE traction as definitive management for a total of 4 weeks (~12/18).  CT 12/11 showed R hip hematoma. RLE Traction off and WB updated per Dr. Jena Gauss WBAT LLE for transfers, NWB RLE. Repeat xrays done 1/6 > advance to WBAT LLE, TDWB RLE.  Stable for d/c from ortho standpoint. On Vit D.  Pelvic hemorrhage - resolved, S/P IR embolization by Dr. Elby Showers 11/12 Acute hypoxic respiratory failure - TRALI, extubated 11/24, guaifenesin prn for secretions. IS and flutter valve.  Right gluteal hematoma Right psoas hematoma Right 11th rib fracture Multiple lumbar vertebral body transverse process fractures Right scalp hematoma R heel ulcer, appears to be a stage I pressure ulcer - Prevalon boot to right foot while in bed, mepilex dressing.   ID - Completed 7 day course for enterobacter and kleb pna. None currently. FEN - Reg diet. Prosource BID. Agitation -  Klonopin 0.25mg  BID for anxiety. Seroquel at HS. On Trazodone, Cymbalta, and Hydroxyzine per psych. Psych recommends therapy/psychiatry referral upon DC VTE - SCDs, LMWH. ASA 81 mg daily x 30 days for DVT ppx per Ortho  Dispo - Med-surg.  Therapies. LOG SNF. Stable for D/C to SNF once bed available. TOC currently looking at [facility]. WB status updated by ortho 1/7. Aquathermia for R hip    LOS: 67 days   Jacinto Halim, [facility] 12/14/2023, 8:49 AM Please see Amion for pager number during day hours 7:00am-4:30pm

## 2023-12-14 NOTE — Progress Notes (Signed)
Mobility Specialist Progress Note:   12/14/23 1039  Mobility  Activity Ambulated with assistance in hallway  Level of Assistance Standby assist, set-up cues, supervision of patient - no hands on  Assistive Device Front wheel walker  Distance Ambulated (ft) 100 ft  RLE Weight Bearing Per Provider Order TWB  LLE Weight Bearing Per Provider Order WBAT  Activity Response Tolerated well  Mobility Referral Yes  Mobility visit 1 Mobility  Mobility Specialist Start Time (ACUTE ONLY) 1015  Mobility Specialist Stop Time (ACUTE ONLY) 1027  Mobility Specialist Time Calculation (min) (ACUTE ONLY) 12 min   Pt received in bed, agreeable to mobility. Stated muscles spasm are well controlled today. Pt was able to ambulate household distance with RW in hallway without assist. Precautions maintained during session. Pt left in chair with call bell in reach and all needs met.   Leory Plowman  Mobility Specialist Please contact via [employer] office at [phone]

## 2023-12-14 NOTE — Plan of Care (Signed)
  Problem: Clinical Measurements: Goal: Will remain free from infection Outcome: Progressing   Problem: Skin Integrity: Goal: Risk for impaired skin integrity will decrease Outcome: Progressing   Problem: Activity: Goal: Ability to ambulate and perform ADLs will improve Outcome: Progressing   Problem: Clinical Measurements: Goal: Postoperative complications will be avoided or minimized Outcome: Progressing   Problem: Pain Management: Goal: Pain level will decrease Outcome: Progressing

## 2023-12-15 NOTE — Plan of Care (Signed)
  Problem: Skin Integrity: Goal: Risk for impaired skin integrity will decrease Outcome: Progressing   Problem: Education: Goal: Verbalization of understanding the information provided (i.e., activity precautions, restrictions, etc) will improve Outcome: Progressing   Problem: Activity: Goal: Ability to ambulate and perform ADLs will improve Outcome: Progressing   Problem: Self-Concept: Goal: Ability to maintain and perform role responsibilities to the fullest extent possible will improve Outcome: Progressing

## 2023-12-15 NOTE — Progress Notes (Signed)
59 Days Post-Op   Subjective/Chief Complaint: No complaints other than soreness   Objective: Vital signs in last 24 hours: Temp:  [98 F (36.7 C)-98.2 F (36.8 C)] 98.2 F (36.8 C) (01/18 0806) Pulse Rate:  [99-107] 106 (01/18 0806) Resp:  [16-18] 16 (01/18 0806) BP: (119-131)/(78-92) 121/89 (01/18 0806) SpO2:  [100 %] 100 % (01/18 0806) Last BM Date : 12/14/23  Intake/Output from previous day: 01/17 0701 - 01/18 0700 In: 1520 [P.O.:1520] Out: 2375 [Urine:2375] Intake/Output this shift: No intake/output data recorded.  General appearance: alert and cooperative Resp: clear to auscultation bilaterally Cardio: regular rate and rhythm GI: soft, nontender  Lab Results:  No results for input(s): "WBC", "HGB", "HCT", "PLT" in the last 72 hours. BMET No results for input(s): "NA", "K", "CL", "CO2", "GLUCOSE", "BUN", "CREATININE", "CALCIUM" in the last 72 hours. PT/INR No results for input(s): "LABPROT", "INR" in the last 72 hours. ABG No results for input(s): "PHART", "HCO3" in the last 72 hours.  Invalid input(s): "PCO2", "PO2"  Studies/Results: No results found.  Anti-infectives: Anti-infectives (From admission, onward)    Start     Dose/Rate Route Frequency Ordered Stop   10/24/23 1800  ceFEPIme (MAXIPIME) 2 g in sodium chloride 0.9 % 100 mL IVPB        2 g 200 mL/hr over 30 Minutes Intravenous Every 12 hours 10/24/23 1108 10/26/23 1813   10/21/23 1400  ceFEPIme (MAXIPIME) 2 g in sodium chloride 0.9 % 100 mL IVPB  Status:  Discontinued        2 g 200 mL/hr over 30 Minutes Intravenous Every 8 hours 10/21/23 0844 10/24/23 1108   10/20/23 1900  ceFEPIme (MAXIPIME) 2 g in sodium chloride 0.9 % 100 mL IVPB  Status:  Discontinued        2 g 200 mL/hr over 30 Minutes Intravenous Every 12 hours 10/20/23 0912 10/21/23 0844   10/19/23 2300  ceFEPIme (MAXIPIME) 2 g in sodium chloride 0.9 % 100 mL IVPB  Status:  Discontinued        2 g 200 mL/hr over 30 Minutes Intravenous  Every 8 hours 10/19/23 2157 10/20/23 0912   10/17/23 2200  ceFAZolin (ANCEF) IVPB 2g/100 mL premix        2 g 200 mL/hr over 30 Minutes Intravenous Every 8 hours 10/17/23 1541 10/18/23 1417   10/12/23 1745  ceFEPIme (MAXIPIME) 2 g in sodium chloride 0.9 % 100 mL IVPB  Status:  Discontinued        2 g 200 mL/hr over 30 Minutes Intravenous Every 8 hours 10/12/23 1319 10/14/23 1037   10/11/23 0945  ceFEPIme (MAXIPIME) 2 g in sodium chloride 0.9 % 100 mL IVPB  Status:  Discontinued        2 g 200 mL/hr over 30 Minutes Intravenous Every 12 hours 10/11/23 0846 10/12/23 1319       Assessment/Plan: s/p Procedure(s): OPEN REDUCTION INTERNAL FIXATION (ORIF) PELVIC FRACTURE (Bilateral) OPEN REDUCTION INTERNAL FIXATION  RIGHT ACETABULUM FRACTURE (Right) Advance diet SNF monday PHBC   S/P ex lap, pelvic packing (5 laps), and temporary abdominal closure with vicryl mesh by Dr. Dossie Der 11/12 - return to OR 11/13 for ex lap, removal of packs and Abthera placement by Dr. Janee Morn S/P ex lap and closure 11/18 Dr. Bedelia Person. BID saline WTD to upper portion of midline wound. On regular diet and having bowel function. - silver nitrate applied to inferior portion of incision for hypergranulation 1/13 and 1/14 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 Vascular  reviewed f/u CTA. F/u 1 year.  R acetabular FX, B superior and inferior rami FXs, R iliac FX, L sacral FX - Dr. Hulda Humphrey consulted, R femur in skeletal traction, S/P SI screw and L acetab perc fixation 11/20 by Dr. Jena Gauss. RLE traction as definitive management for a total of 4 weeks (~12/18).  CT 12/11 showed R hip hematoma. RLE Traction off and WB updated per Dr. Jena Gauss WBAT LLE for transfers, NWB RLE. Repeat xrays done 1/6 > advance to WBAT LLE, TDWB RLE.  Stable for d/c from ortho standpoint. On Vit D.  Pelvic hemorrhage - resolved, S/P IR embolization by Dr. Elby Showers 11/12 Acute hypoxic respiratory failure - TRALI, extubated 11/24, guaifenesin prn  for secretions. IS and flutter valve.  Right gluteal hematoma Right psoas hematoma Right 11th rib fracture Multiple lumbar vertebral body transverse process fractures Right scalp hematoma R heel ulcer, appears to be a stage I pressure ulcer - Prevalon boot to right foot while in bed, mepilex dressing.   ID - Completed 7 day course for enterobacter and kleb pna. None currently. FEN - Reg diet. Prosource BID. Agitation -  Klonopin 0.25mg  BID for anxiety. Seroquel at HS. On Trazodone, Cymbalta, and Hydroxyzine per psych. Psych recommends therapy/psychiatry referral upon DC VTE - SCDs, LMWH. ASA 81 mg daily x 30 days for DVT ppx per Ortho  Dispo - Med-surg.  Therapies. LOG SNF. Stable for D/C to SNF once bed available. TOC currently looking at [facility]. WB status updated by ortho 1/7. Aquathermia for R hip   LOS: 68 days    [name] [name] 12/15/2023

## 2023-12-15 NOTE — Plan of Care (Signed)
  Problem: Education: Goal: Verbalization of understanding the information provided (i.e., activity precautions, restrictions, etc) will improve Outcome: Progressing   Problem: Activity: Goal: Ability to ambulate and perform ADLs will improve Outcome: Progressing   Problem: Clinical Measurements: Goal: Postoperative complications will be avoided or minimized Outcome: Progressing   Problem: Self-Concept: Goal: Ability to maintain and perform role responsibilities to the fullest extent possible will improve Outcome: Progressing   Problem: Pain Management: Goal: Pain level will decrease 12/15/2023 2318 by Sammuel Cooper, RN Outcome: Progressing 12/15/2023 2314 by Sammuel Cooper, RN Outcome: Progressing

## 2023-12-16 NOTE — Plan of Care (Signed)
  Problem: Clinical Measurements: Goal: Will remain free from infection Outcome: Progressing Goal: Respiratory complications will improve Outcome: Progressing   Problem: Skin Integrity: Goal: Risk for impaired skin integrity will decrease Outcome: Progressing   Problem: Education: Goal: Verbalization of understanding the information provided (i.e., activity precautions, restrictions, etc) will improve Outcome: Progressing Goal: Individualized Educational Video(s) Outcome: Progressing   Problem: Activity: Goal: Ability to ambulate and perform ADLs will improve Outcome: Progressing   Problem: Clinical Measurements: Goal: Postoperative complications will be avoided or minimized Outcome: Progressing   Problem: Self-Concept: Goal: Ability to maintain and perform role responsibilities to the fullest extent possible will improve Outcome: Progressing   Problem: Pain Management: Goal: Pain level will decrease Outcome: Progressing

## 2023-12-16 NOTE — Progress Notes (Signed)
60 Days Post-Op   Subjective/Chief Complaint: No complaints   Objective: Vital signs in last 24 hours: Temp:  [98 F (36.7 C)-98.4 F (36.9 C)] 98 F (36.7 C) (01/19 0747) Pulse Rate:  [94-106] 100 (01/19 0747) Resp:  [16-18] 17 (01/19 0747) BP: (119-155)/(68-97) 129/88 (01/19 0747) SpO2:  [99 %-100 %] 99 % (01/19 0747) Last BM Date : 12/14/23  Intake/Output from previous day: 01/18 0701 - 01/19 0700 In: -  Out: 2100 [Urine:2100] Intake/Output this shift: No intake/output data recorded.  General appearance: alert and cooperative Resp: clear to auscultation bilaterally Cardio: regular rate and rhythm GI: soft, nontender  Lab Results:  No results for input(s): "WBC", "HGB", "HCT", "PLT" in the last 72 hours. BMET No results for input(s): "NA", "K", "CL", "CO2", "GLUCOSE", "BUN", "CREATININE", "CALCIUM" in the last 72 hours. PT/INR No results for input(s): "LABPROT", "INR" in the last 72 hours. ABG No results for input(s): "PHART", "HCO3" in the last 72 hours.  Invalid input(s): "PCO2", "PO2"  Studies/Results: No results found.  Anti-infectives: Anti-infectives (From admission, onward)    Start     Dose/Rate Route Frequency Ordered Stop   10/24/23 1800  ceFEPIme (MAXIPIME) 2 g in sodium chloride 0.9 % 100 mL IVPB        2 g 200 mL/hr over 30 Minutes Intravenous Every 12 hours 10/24/23 1108 10/26/23 1813   10/21/23 1400  ceFEPIme (MAXIPIME) 2 g in sodium chloride 0.9 % 100 mL IVPB  Status:  Discontinued        2 g 200 mL/hr over 30 Minutes Intravenous Every 8 hours 10/21/23 0844 10/24/23 1108   10/20/23 1900  ceFEPIme (MAXIPIME) 2 g in sodium chloride 0.9 % 100 mL IVPB  Status:  Discontinued        2 g 200 mL/hr over 30 Minutes Intravenous Every 12 hours 10/20/23 0912 10/21/23 0844   10/19/23 2300  ceFEPIme (MAXIPIME) 2 g in sodium chloride 0.9 % 100 mL IVPB  Status:  Discontinued        2 g 200 mL/hr over 30 Minutes Intravenous Every 8 hours 10/19/23 2157  10/20/23 0912   10/17/23 2200  ceFAZolin (ANCEF) IVPB 2g/100 mL premix        2 g 200 mL/hr over 30 Minutes Intravenous Every 8 hours 10/17/23 1541 10/18/23 1417   10/12/23 1745  ceFEPIme (MAXIPIME) 2 g in sodium chloride 0.9 % 100 mL IVPB  Status:  Discontinued        2 g 200 mL/hr over 30 Minutes Intravenous Every 8 hours 10/12/23 1319 10/14/23 1037   10/11/23 0945  ceFEPIme (MAXIPIME) 2 g in sodium chloride 0.9 % 100 mL IVPB  Status:  Discontinued        2 g 200 mL/hr over 30 Minutes Intravenous Every 12 hours 10/11/23 0846 10/12/23 1319       Assessment/Plan: s/p Procedure(s): OPEN REDUCTION INTERNAL FIXATION (ORIF) PELVIC FRACTURE (Bilateral) OPEN REDUCTION INTERNAL FIXATION  RIGHT ACETABULUM FRACTURE (Right) Advance diet SNF [facility]   S/P ex lap, pelvic packing (5 laps), and temporary abdominal closure with vicryl mesh by Dr. Dossie Der 11/12 - return to OR 11/13 for ex lap, removal of packs and Abthera placement by Dr. Janee Morn S/P ex lap and closure 11/18 Dr. Bedelia Person. BID saline WTD to upper portion of midline wound. On regular diet and having bowel function. - silver nitrate applied to inferior portion of incision for hypergranulation 1/13 and 1/14 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 Vascular reviewed f/u  CTA. F/u 1 year.  R acetabular FX, B superior and inferior rami FXs, R iliac FX, L sacral FX - Dr. Hulda Humphrey consulted, R femur in skeletal traction, S/P SI screw and L acetab perc fixation 11/20 by Dr. Jena Gauss. RLE traction as definitive management for a total of 4 weeks (~12/18).  CT 12/11 showed R hip hematoma. RLE Traction off and WB updated per Dr. Jena Gauss WBAT LLE for transfers, NWB RLE. Repeat xrays done 1/6 > advance to WBAT LLE, TDWB RLE.  Stable for d/c from ortho standpoint. On Vit D.  Pelvic hemorrhage - resolved, S/P IR embolization by Dr. Elby Showers 11/12 Acute hypoxic respiratory failure - TRALI, extubated 11/24, guaifenesin prn for secretions. IS and  flutter valve.  Right gluteal hematoma Right psoas hematoma Right 11th rib fracture Multiple lumbar vertebral body transverse process fractures Right scalp hematoma R heel ulcer, appears to be a stage I pressure ulcer - Prevalon boot to right foot while in bed, mepilex dressing.   ID - Completed 7 day course for enterobacter and kleb pna. None currently. FEN - Reg diet. Prosource BID. Agitation -  Klonopin 0.25mg  BID for anxiety. Seroquel at HS. On Trazodone, Cymbalta, and Hydroxyzine per psych. Psych recommends therapy/psychiatry referral upon DC VTE - SCDs, LMWH. ASA 81 mg daily x 30 days for DVT ppx per Ortho  Dispo - Med-surg.  Therapies. LOG SNF. Stable for D/C to SNF once bed available. TOC currently looking at [facility]. WB status updated by ortho 1/7. Aquathermia for R hip   LOS: 69 days    Chevis Pretty III 12/16/2023

## 2023-12-17 ENCOUNTER — Other Ambulatory Visit: Payer: Self-pay | Admitting: Orthopedic Surgery

## 2023-12-17 DIAGNOSIS — F419 Anxiety disorder, unspecified: Secondary | ICD-10-CM

## 2023-12-17 MED ORDER — SUCRALFATE 1 G PO TABS: 1.0000 g | ORAL_TABLET | Freq: Three times a day (TID) | ORAL | Status: DC

## 2023-12-17 MED ORDER — OXYCODONE HCL 5 MG PO TABS: 5.0000 mg | ORAL_TABLET | Freq: Four times a day (QID) | ORAL | 0 refills | Status: DC | PRN

## 2023-12-17 MED ORDER — CYCLOBENZAPRINE HCL 10 MG PO TABS: 10.0000 mg | ORAL_TABLET | Freq: Three times a day (TID) | ORAL | Status: DC | PRN

## 2023-12-17 MED ORDER — GABAPENTIN 100 MG PO CAPS: 200.0000 mg | ORAL_CAPSULE | Freq: Three times a day (TID) | ORAL | Status: DC

## 2023-12-17 MED ORDER — DULOXETINE HCL 60 MG PO CPEP: 60.0000 mg | ORAL_CAPSULE | Freq: Every day | ORAL | Status: DC

## 2023-12-17 MED ORDER — CLONAZEPAM 0.25 MG PO TBDP: 0.2500 mg | ORAL_TABLET | Freq: Every day | ORAL | 0 refills | Status: DC

## 2023-12-17 MED ORDER — IBUPROFEN 600 MG PO TABS: 600.0000 mg | ORAL_TABLET | Freq: Three times a day (TID) | ORAL | Status: DC | PRN

## 2023-12-17 MED ORDER — ASPIRIN 81 MG PO TBEC: 81.0000 mg | DELAYED_RELEASE_TABLET | Freq: Every day | ORAL | Status: DC

## 2023-12-17 MED ORDER — TRAZODONE HCL 50 MG PO TABS: 50.0000 mg | ORAL_TABLET | Freq: Every day | ORAL | 0 refills | Status: DC

## 2023-12-17 MED ORDER — ACETAMINOPHEN 500 MG PO TABS: 1000.0000 mg | ORAL_TABLET | Freq: Four times a day (QID) | ORAL | Status: DC | PRN

## 2023-12-17 NOTE — Progress Notes (Addendum)
AVS printed  in folder for Transport in drawer with Chart

## 2023-12-17 NOTE — Telephone Encounter (Signed)
I have a surgery order from 09/24/24 to schedule [name] [name] for Right total knee arthroplasty at his convenience.  When you call his contact # [phone] the message is "your call cannot be completed as dialed". I will hold his surgery sheet until he reaches out to [place] for surgery.

## 2023-12-17 NOTE — Progress Notes (Signed)
Discharge transported by [facility].

## 2023-12-17 NOTE — TOC Transition Note (Signed)
Transition of Care [facility]) - Discharge Note   Patient Details  Name: [name] [name] MRN: [id number] Date of Birth: 03/16/1961  Transition of Care [facility]) CM/SW Contact:  Ronny Bacon, RN Phone Number: 12/17/2023, 11:29 AM   Clinical Narrative:   [facility] transportation confirmed for 12 pm.    Final next level of care: Skilled Nursing Facility Barriers to Discharge: Continued Medical Work up   Patient Goals and CMS Choice            Discharge Placement                       Discharge Plan and Services Additional resources added to the After Visit Summary for     Discharge Planning Services: CM Consult                                 Social Drivers of Health (SDOH) Interventions SDOH Screenings   Food Insecurity: Patient Unable To Answer (10/18/2023)  Housing: Patient Unable To Answer (10/18/2023)  Transportation Needs: Patient Unable To Answer (10/18/2023)  Utilities: Patient Unable To Answer (10/18/2023)  Tobacco Use: Low Risk  (11/18/2023)     Readmission Risk Interventions     No data to display

## 2023-12-17 NOTE — Plan of Care (Signed)
  Problem: Clinical Measurements: Goal: Will remain free from infection Outcome: Adequate for Discharge Goal: Respiratory complications will improve Outcome: Adequate for Discharge   Problem: Skin Integrity: Goal: Risk for impaired skin integrity will decrease Outcome: Adequate for Discharge   Problem: Education: Goal: Verbalization of understanding the information provided (i.e., activity precautions, restrictions, etc) will improve Outcome: Adequate for Discharge Goal: Individualized Educational Video(s) Outcome: Adequate for Discharge   Problem: Activity: Goal: Ability to ambulate and perform ADLs will improve Outcome: Adequate for Discharge   Problem: Clinical Measurements: Goal: Postoperative complications will be avoided or minimized Outcome: Adequate for Discharge   Problem: Self-Concept: Goal: Ability to maintain and perform role responsibilities to the fullest extent possible will improve Outcome: Adequate for Discharge   Problem: Pain Management: Goal: Pain level will decrease Outcome: Adequate for Discharge

## 2023-12-17 NOTE — Progress Notes (Signed)
Called the facility at [phone] but no one pick up my call after the receptionist transferred my call.  Called the facility again X2, no answer.

## 2023-12-17 NOTE — Progress Notes (Addendum)
Progress Note  61 Days Post-Op  Subjective: NAEO. Says he may go to rehab today. Asks about being resumed on lexapro, which he was taking PTA for anxiety  On Friday he worked on stairs with PT and walked 100 ft in the hallway.   Objective: Vital signs in last 24 hours: Temp:  [98 F (36.7 C)-98.6 F (37 C)] 98.6 F (37 C) (01/20 0710) Pulse Rate:  [93-105] 102 (01/20 0710) Resp:  [16-18] 16 (01/20 0710) BP: (123-136)/(88-93) 123/88 (01/20 0710) SpO2:  [99 %-100 %] 100 % (01/20 0710) Last BM Date : 12/15/23  Intake/Output from previous day: 01/19 0701 - 01/20 0700 In: 240 [P.O.:240] Out: 1200 [Urine:1200] Intake/Output this shift: No intake/output data recorded.  PE: Gen:  Alert, NAD Card: Reg Pulm: CTA b/l, normal rate and effort  Abd: soft, nondistended, NT, midline wound stable Ext: No LE edema.  Psych: A&Ox3  Lab Results:    CMP     Component Value Date/Time   NA 134 (L) 11/11/2023 0545   K 4.1 11/11/2023 0545   CL 93 (L) 11/11/2023 0545   CO2 27 11/11/2023 0545   GLUCOSE 92 11/11/2023 0545   BUN 14 11/11/2023 0545   CREATININE 1.12 11/11/2023 0545   CALCIUM 8.9 11/11/2023 0545   PROT 7.1 10/25/2023 0520   ALBUMIN 2.4 (L) 10/25/2023 0520   AST 47 (H) 10/25/2023 0520   ALT 45 (H) 10/25/2023 0520   ALKPHOS 127 (H) 10/25/2023 0520   BILITOT 2.5 (H) 10/25/2023 0520   GFRNONAA >60 11/11/2023 0545   Lipase  No results found for: "LIPASE"     Studies/Results: No results found.   Anti-infectives: Anti-infectives (From admission, onward)    Start     Dose/Rate Route Frequency Ordered Stop   10/24/23 1800  ceFEPIme (MAXIPIME) 2 g in sodium chloride 0.9 % 100 mL IVPB        2 g 200 mL/hr over 30 Minutes Intravenous Every 12 hours 10/24/23 1108 10/26/23 1813   10/21/23 1400  ceFEPIme (MAXIPIME) 2 g in sodium chloride 0.9 % 100 mL IVPB  Status:  Discontinued        2 g 200 mL/hr over 30 Minutes Intravenous Every 8 hours 10/21/23 0844 10/24/23 1108    10/20/23 1900  ceFEPIme (MAXIPIME) 2 g in sodium chloride 0.9 % 100 mL IVPB  Status:  Discontinued        2 g 200 mL/hr over 30 Minutes Intravenous Every 12 hours 10/20/23 0912 10/21/23 0844   10/19/23 2300  ceFEPIme (MAXIPIME) 2 g in sodium chloride 0.9 % 100 mL IVPB  Status:  Discontinued        2 g 200 mL/hr over 30 Minutes Intravenous Every 8 hours 10/19/23 2157 10/20/23 0912   10/17/23 2200  ceFAZolin (ANCEF) IVPB 2g/100 mL premix        2 g 200 mL/hr over 30 Minutes Intravenous Every 8 hours 10/17/23 1541 10/18/23 1417   10/12/23 1745  ceFEPIme (MAXIPIME) 2 g in sodium chloride 0.9 % 100 mL IVPB  Status:  Discontinued        2 g 200 mL/hr over 30 Minutes Intravenous Every 8 hours 10/12/23 1319 10/14/23 1037   10/11/23 0945  ceFEPIme (MAXIPIME) 2 g in sodium chloride 0.9 % 100 mL IVPB  Status:  Discontinued        2 g 200 mL/hr over 30 Minutes Intravenous Every 12 hours 10/11/23 0846 10/12/23 1319        Assessment/Plan PHBC  S/P ex lap, pelvic packing (5 laps), and temporary abdominal closure with vicryl mesh by Dr. Dossie Der 11/12 - return to OR 11/13 for ex lap, removal of packs and Abthera placement by Dr. Janee Morn S/P ex lap and closure 11/18 Dr. Bedelia Person. BID saline WTD to upper portion of midline wound. On regular diet and having bowel function. - silver nitrate applied to inferior portion of incision for hypergranulation 1/13 and 1/14 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 Vascular reviewed f/u CTA. F/u 1 year.  R acetabular FX, B superior and inferior rami FXs, R iliac FX, L sacral FX - Dr. Hulda Humphrey consulted, R femur in skeletal traction, S/P SI screw and L acetab perc fixation 11/20 by Dr. Jena Gauss. RLE traction as definitive management for a total of 4 weeks (~12/18).  CT 12/11 showed R hip hematoma. RLE Traction off and WB updated per Dr. Jena Gauss WBAT LLE for transfers, NWB RLE. Repeat xrays done 1/6 > advance to WBAT LLE, TDWB RLE.  Stable for d/c from ortho  standpoint. On Vit D.  Pelvic hemorrhage - resolved, S/P IR embolization by Dr. Elby Showers 11/12 Acute hypoxic respiratory failure - TRALI, extubated 11/24, guaifenesin prn for secretions. IS and flutter valve.  Right gluteal hematoma Right psoas hematoma Right 11th rib fracture Multiple lumbar vertebral body transverse process fractures Right scalp hematoma R heel ulcer, appears to be a stage I pressure ulcer - Prevalon boot to right foot while in bed, mepilex dressing.   ID - Completed 7 day course for enterobacter and kleb pna. None currently. FEN - Reg diet. Prosource BID. Agitation -  Klonopin 0.25mg  BID for anxiety. Seroquel at HS. On Trazodone, Cymbalta, and Hydroxyzine per psych. Psych recommends therapy/psychiatry referral upon DC VTE - SCDs, LMWH. ASA 81 mg daily x 30 days for DVT ppx per Ortho   Dispo - Med-surg.  Therapies. WB status updated by ortho 1/7. Aquathermia for R hip  Pt has been accepted to [facility] in [place], [place], will await info from facility on timing of transfer  Regarding patients reported lexapro use, he was started on cymbalta here and is currently on 60 mg so will continue that for now and leave further titrations to his PCP/outpatient psych.     LOS: 70 days   Adam Phenix, [facility] Surgery 12/17/2023, 7:40 AM Please see Amion for pager number during day hours 7:00am-4:30pm

## 2023-12-18 ENCOUNTER — Encounter: Payer: Self-pay | Admitting: Orthopedic Surgery

## 2023-12-20 NOTE — Progress Notes (Signed)
Patient had a major laceration of the thoracic aorta repaired by TEVAR.

## 2024-01-08 ENCOUNTER — Encounter (HOSPITAL_COMMUNITY): Payer: Self-pay

## 2024-01-08 ENCOUNTER — Emergency Department (HOSPITAL_COMMUNITY): Payer: MEDICAID

## 2024-01-08 ENCOUNTER — Other Ambulatory Visit: Payer: Self-pay

## 2024-01-08 ENCOUNTER — Emergency Department (HOSPITAL_COMMUNITY)
Admission: EM | Admit: 2024-01-08 | Discharge: 2024-01-08 | Disposition: A | Discharge status: Home / Self Care | Payer: MEDICAID | Attending: Emergency Medicine | Admitting: Emergency Medicine

## 2024-01-08 DIAGNOSIS — Z7982 Long term (current) use of aspirin: Secondary | ICD-10-CM | POA: Insufficient documentation

## 2024-01-08 DIAGNOSIS — Z4801 Encounter for change or removal of surgical wound dressing: Secondary | ICD-10-CM | POA: Diagnosis present

## 2024-01-08 DIAGNOSIS — L03311 Cellulitis of abdominal wall: Secondary | ICD-10-CM | POA: Diagnosis not present

## 2024-01-08 LAB — URINALYSIS, W/ REFLEX TO CULTURE (INFECTION SUSPECTED)
Bacteria, UA: NONE SEEN
Bilirubin Urine: NEGATIVE
Glucose, UA: NEGATIVE mg/dL
Ketones, ur: NEGATIVE mg/dL
Leukocytes,Ua: NEGATIVE
Nitrite: NEGATIVE
Protein, ur: NEGATIVE mg/dL
Specific Gravity, Urine: 1.029 (ref 1.005–1.030)
pH: 7 (ref 5.0–8.0)

## 2024-01-08 LAB — CBC WITH DIFFERENTIAL/PLATELET
Abs Immature Granulocytes: 0.07 10*3/uL (ref 0.00–0.07)
Basophils Absolute: 0.1 10*3/uL (ref 0.0–0.1)
Basophils Relative: 1 %
Eosinophils Absolute: 0.1 10*3/uL (ref 0.0–0.5)
Eosinophils Relative: 1 %
HCT: 43.3 % (ref 39.0–52.0)
Hemoglobin: 13.1 g/dL (ref 13.0–17.0)
Immature Granulocytes: 1 %
Lymphocytes Relative: 22 %
Lymphs Abs: 1.6 10*3/uL (ref 0.7–4.0)
MCH: 25.9 pg — ABNORMAL LOW (ref 26.0–34.0)
MCHC: 30.3 g/dL (ref 30.0–36.0)
MCV: 85.6 fL (ref 80.0–100.0)
Monocytes Absolute: 0.6 10*3/uL (ref 0.1–1.0)
Monocytes Relative: 9 %
Neutro Abs: 4.9 10*3/uL (ref 1.7–7.7)
Neutrophils Relative %: 66 %
Platelets: 317 10*3/uL (ref 150–400)
RBC: 5.06 MIL/uL (ref 4.22–5.81)
RDW: 13.4 % (ref 11.5–15.5)
WBC: 7.4 10*3/uL (ref 4.0–10.5)
nRBC: 0 % (ref 0.0–0.2)

## 2024-01-08 LAB — COMPREHENSIVE METABOLIC PANEL
ALT: 20 U/L (ref 0–44)
AST: 21 U/L (ref 15–41)
Albumin: 3.4 g/dL — ABNORMAL LOW (ref 3.5–5.0)
Alkaline Phosphatase: 72 U/L (ref 38–126)
Anion gap: 13 (ref 5–15)
BUN: 7 mg/dL — ABNORMAL LOW (ref 8–23)
CO2: 22 mmol/L (ref 22–32)
Calcium: 9.7 mg/dL (ref 8.9–10.3)
Chloride: 100 mmol/L (ref 98–111)
Creatinine, Ser: 0.84 mg/dL (ref 0.61–1.24)
GFR, Estimated: >60 mL/min (ref >60)
Glucose, Bld: 91 mg/dL (ref 70–99)
Potassium: 3.8 mmol/L (ref 3.5–5.1)
Sodium: 135 mmol/L (ref 135–145)
Total Bilirubin: 0.4 mg/dL (ref 0.0–1.2)
Total Protein: 8.9 g/dL — ABNORMAL HIGH (ref 6.5–8.1)

## 2024-01-08 LAB — I-STAT CG4 LACTIC ACID, ED
Lactic Acid, Venous: 3.5 mmol/L (ref 0.5–1.9)
Lactic Acid, Venous: 0.7 mmol/L (ref 0.5–1.9)

## 2024-01-08 MED ORDER — METRONIDAZOLE 500 MG/100ML IV SOLN
500.0000 mg | Freq: Once | INTRAVENOUS | Status: AC
  Administered 2024-01-08: 500 mg via INTRAVENOUS
  Filled 2024-01-08: qty 100

## 2024-01-08 MED ORDER — SODIUM CHLORIDE 0.9 % IV SOLN
2.0000 g | Freq: Once | INTRAVENOUS | Status: AC
  Administered 2024-01-08: 2 g via INTRAVENOUS
  Filled 2024-01-08: qty 12.5

## 2024-01-08 MED ORDER — IOHEXOL 350 MG/ML SOLN
75.0000 mL | Freq: Once | INTRAVENOUS | Status: AC | PRN
  Administered 2024-01-08: 75 mL via INTRAVENOUS

## 2024-01-08 MED ORDER — LACTATED RINGERS IV BOLUS (SEPSIS): 1000.0000 mL | Freq: Once | INTRAVENOUS | Status: DC

## 2024-01-08 MED ORDER — VANCOMYCIN HCL IN DEXTROSE 1-5 GM/200ML-% IV SOLN
1000.0000 mg | Freq: Once | INTRAVENOUS | Status: AC
  Administered 2024-01-08: 1000 mg via INTRAVENOUS
  Filled 2024-01-08: qty 200

## 2024-01-08 MED ORDER — ACETAMINOPHEN 325 MG PO TABS
650.0000 mg | ORAL_TABLET | Freq: Once | ORAL | Status: AC
  Administered 2024-01-08: 650 mg via ORAL
  Filled 2024-01-08: qty 2

## 2024-01-08 MED ORDER — AMOXICILLIN-POT CLAVULANATE 875-125 MG PO TABS: 1.0000 | ORAL_TABLET | Freq: Two times a day (BID) | ORAL | 0 refills | Status: DC

## 2024-01-08 MED ORDER — LORAZEPAM 1 MG PO TABS
1.0000 mg | ORAL_TABLET | Freq: Once | ORAL | Status: AC
  Administered 2024-01-08: 1 mg via ORAL
  Filled 2024-01-08: qty 1

## 2024-01-08 MED ORDER — SODIUM CHLORIDE 0.9 % IV BOLUS
1000.0000 mL | Freq: Once | INTRAVENOUS | Status: AC
  Administered 2024-01-08: 1000 mL via INTRAVENOUS

## 2024-01-08 NOTE — ED Notes (Addendum)
Pt has opened area off incision mid wound a little area towards bottom of incision and very bottom off incision. All of the open areas has pink bedding and not drainage noted at this time. The lower abdomen is swollen, erythematous and tender
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
## 2024-01-08 NOTE — Discharge Instructions (Signed)
We are treating you for a possible skin infection of your abdominal wall with 5 days of antibiotics.  Follow-up with the general surgery office if your abdominal wound does not improve or you develop any concerns.  However if you feel like you are getting acutely worse, have a fever, or any other new/concerning symptoms then return to the ER or call 911.

## 2024-01-08 NOTE — ED Notes (Signed)
Pt states has someone picking him up

## 2024-01-08 NOTE — Sepsis Progress Note (Signed)
Elink monitoring for the code sepsis protocol.

## 2024-01-08 NOTE — ED Notes (Signed)
Patient transported to CT

## 2024-01-08 NOTE — ED Provider Notes (Signed)
[place] [facility]ERGENCY DEPARTMENT AT [facility] Provider Note   CSN: [id number] Arrival date & time: 01/08/24  7829     History  Chief Complaint  Patient presents with   Wound Check    [name] [name] is a 63 y.o. male.  HPI 63 year old male presents with concern for an abdominal wall infection.  In November he had to have an exploratory laparotomy for trauma.  For the past several weeks and up to a month he has noticed abdominal distention and discomfort to his lower abdomen.  The wound has been draining for a few weeks.  Started having bleeding from it last night which is what made him present today.  He denies fevers.  Is not really painful unless he touches it.  He went to the general surgery office on 2/7 and they told him he likely had an infection and advised him to get a CT.  This has not yet happened.  He is not on antibiotics.  Home Medications Prior to Admission medications   Medication Sig Start Date End Date Taking? Authorizing Provider  amoxicillin-clavulanate (AUGMENTIN) 875-125 MG tablet Take 1 tablet by mouth every 12 (twelve) hours. 01/08/24  Yes Pricilla Loveless, MD  acetaminophen (TYLENOL) 500 MG tablet Take 2 tablets (1,000 mg total) by mouth every 6 (six) hours as needed. 12/17/23   Barnetta Chapel, PA-C  aspirin EC 81 MG tablet Take 1 tablet (81 mg total) by mouth daily. Swallow whole. 12/18/23   Barnetta Chapel, PA-C  clonazePAM (KLONOPIN) 0.25 MG disintegrating tablet Take 1 tablet (0.25 mg total) by mouth daily. 12/17/23   Barnetta Chapel, PA-C  cyclobenzaprine (FLEXERIL) 10 MG tablet Take 1 tablet (10 mg total) by mouth 3 (three) times daily as needed for muscle spasms. 12/17/23   Barnetta Chapel, PA-C  DULoxetine (CYMBALTA) 60 MG capsule Take 1 capsule (60 mg total) by mouth daily. 12/18/23   Barnetta Chapel, PA-C  escitalopram (LEXAPRO) 20 MG tablet Take 1 tablet (20 mg total) by mouth daily. 02/21/23   Claiborne Rigg, NP  gabapentin (NEURONTIN) 100 MG capsule  Take 2 capsules (200 mg total) by mouth 3 (three) times daily. 12/17/23   Barnetta Chapel, PA-C  gabapentin (NEURONTIN) 300 MG capsule Take 1 capsule (300 mg total) by mouth 3 (three) times daily. 10/07/23   Claiborne Rigg, NP  hydrOXYzine (ATARAX) 25 MG tablet Take 1 tablet (25 mg total) by mouth 2 (two) times daily as needed. 08/09/23     hydrOXYzine (VISTARIL) 25 MG capsule Take 1 capsule (25 mg total) by mouth every 8 (eight) hours as needed. For anxiety Patient not taking: Reported on 07/20/2023 05/10/23   Anders Simmonds, PA-C  ibuprofen (ADVIL) 600 MG tablet Take 1 tablet (600 mg total) by mouth every 8 (eight) hours as needed for fever (temp >101.5). 12/17/23   Barnetta Chapel, PA-C  ondansetron (ZOFRAN) 4 MG tablet Take 1 tablet (4 mg total) by mouth every 6 (six) hours. Patient not taking: Reported on 07/20/2023 04/22/23   Jeanelle Malling, PA  oxyCODONE (OXY IR/ROXICODONE) 5 MG immediate release tablet Take 1 tablet (5 mg total) by mouth every 6 (six) hours as needed. 12/17/23   Barnetta Chapel, PA-C  sodium chloride 1 g tablet Take 1 tablet (1 g total) by mouth 2 (two) times daily with a meal. Patient not taking: Reported on 07/20/2023 05/10/23   Anders Simmonds, PA-C  sucralfate (CARAFATE) 1 g tablet Take 1 tablet (1 g total) by mouth 4 (four) times  daily -  with meals and at bedtime. 12/17/23   Barnetta Chapel, PA-C  traZODone (DESYREL) 50 MG tablet Take 1 tablet (50 mg total) by mouth at bedtime as needed for sleep. 11/13/22   Lamar Sprinkles, MD  traZODone (DESYREL) 50 MG tablet Take 1 tablet (50 mg total) by mouth at bedtime. 12/17/23   Barnetta Chapel, PA-C      Allergies    Oxycodone    Review of Systems   Review of Systems  Constitutional:  Negative for fever.  Gastrointestinal:  Positive for abdominal pain.  Skin:  Positive for color change and wound.    Physical Exam Updated Vital Signs BP (!) 149/105   Pulse (!) 109   Temp 98.4 F (36.9 C) (Oral)   Resp 20   Ht 5\' 6"  (1.676 m)    Wt 61.7 kg   SpO2 100%   BMI 21.95 kg/m  Physical Exam Vitals and nursing note reviewed.  Constitutional:      Appearance: He is well-developed. He is not ill-appearing or diaphoretic.  HENT:     Head: Normocephalic and atraumatic.  Cardiovascular:     Rate and Rhythm: Regular rhythm. Tachycardia present.     Pulses:          Radial pulses are 2+ on the left side.  Pulmonary:     Effort: Pulmonary effort is normal.  Abdominal:     Palpations: Abdomen is soft.     Tenderness: There is abdominal tenderness.     Comments: See picture. No current drainage/bleeding. Lower abdomen is firm and erythematous. Is tender.  Skin:    General: Skin is warm and dry.  Neurological:     Mental Status: He is alert.     ED Results / Procedures / Treatments   Labs (all labs ordered are listed, but only abnormal results are displayed) Labs Reviewed  COMPREHENSIVE METABOLIC PANEL - Abnormal; Notable for the following components:      Result Value   BUN 7 (*)    Total Protein 8.9 (*)    Albumin 3.4 (*)    All other components within normal limits  CBC WITH DIFFERENTIAL/PLATELET - Abnormal; Notable for the following components:   MCH 25.9 (*)    All other components within normal limits  URINALYSIS, W/ REFLEX TO CULTURE (INFECTION SUSPECTED) - Abnormal; Notable for the following components:   Color, Urine STRAW (*)    Hgb urine dipstick SMALL (*)    All other components within normal limits  I-STAT CG4 LACTIC ACID, ED - Abnormal; Notable for the following components:   Lactic Acid, Venous 3.5 (*)    All other components within normal limits  CULTURE, BLOOD (ROUTINE X 2)  CULTURE, BLOOD (ROUTINE X 2)  I-STAT CG4 LACTIC ACID, ED    EKG None  Radiology CT ABDOMEN PELVIS W CONTRAST Result Date: 01/08/2024 CLINICAL DATA:  Draining abdominal wound EXAM: CT ABDOMEN AND PELVIS WITH CONTRAST TECHNIQUE: Multidetector CT imaging of the abdomen and pelvis was performed using the standard  protocol following bolus administration of intravenous contrast. RADIATION DOSE REDUCTION: This exam was performed according to the departmental dose-optimization program which includes automated exposure control, adjustment of the mA and/or kV according to patient size and/or use of iterative reconstruction technique. CONTRAST:  75mL OMNIPAQUE IOHEXOL 350 MG/ML SOLN COMPARISON:  CT 10/08/2023, 11/07/2023 FINDINGS: Lower chest: No acute abnormality. Hepatobiliary: No focal liver abnormality is seen. No gallstones, gallbladder wall thickening, or biliary dilatation. Pancreas: Unremarkable. No pancreatic ductal dilatation  or surrounding inflammatory changes. Spleen: Normal in size without focal abnormality. Adrenals/Urinary Tract: Unremarkable adrenal glands. Kidneys enhance symmetrically without focal lesion, stone, or hydronephrosis. Ureters are nondilated. Mild urinary bladder wall thickening which may be accentuated by under-distension. Stomach/Bowel: Stomach is within normal limits. Appendix appears normal. No evidence of bowel wall thickening, distention, or inflammatory changes. Moderate volume stool throughout the colon. Vascular/Lymphatic: Aortoiliac atherosclerosis without aneurysm. Probable pseudoaneurysm within the deep gluteal soft tissues medially adjacent to the posterior right iliac bone measuring 18 x 11 mm (series 3, image 58), minimally increased in size from prior. No lymphadenopathy. Reproductive: Prostate is unremarkable. Other: No free air or free fluid. Musculoskeletal: Extensive subacute bilateral pelvic fractures with prior bilateral SI joint fusion and left superior pubic ramus ORIF. There is partial callus formation at the fracture sites. No significant change in fracture alignment. 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 Multiple bilateral lumbar transverse process fractures. No new or acute bony findings. Previously seen large right gluteal hematoma has  nearly resolved. Soft tissue thickening and possibly a small amount of fluid along the surgical incision site at the anterior abdominal wall. No organized or drainable fluid collections. No soft tissue gas. IMPRESSION: 1. Soft tissue thickening and possibly a small amount of fluid along the surgical incision site at the anterior abdominal wall. No organized or drainable fluid collections. No soft tissue gas. 2. Extensive subacute bilateral pelvic fractures with prior bilateral SI joint fusion and left superior pubic ramus ORIF. No significant change in fracture alignment. 3. Probable pseudoaneurysm within the deep gluteal soft tissues medially adjacent to the posterior right iliac bone measuring 18 x 11 mm, minimally increased in size from prior. 4. Mild urinary bladder wall thickening which may be accentuated by under-distension. Correlate with urinalysis to exclude cystitis. 5. Moderate volume stool throughout the colon. 6. Aortic atherosclerosis (ICD10-I70.0). Electronically Signed   By: Duanne Guess D.O.   On: 01/08/2024 14:25    Procedures Procedures    Medications Ordered in ED Medications  ceFEPIme (MAXIPIME) 2 g in sodium chloride 0.9 % 100 mL IVPB (0 g Intravenous Stopped 01/08/24 1120)  metroNIDAZOLE (FLAGYL) IVPB 500 mg (0 mg Intravenous Stopped 01/08/24 1133)  vancomycin (VANCOCIN) IVPB 1000 mg/200 mL premix (0 mg Intravenous Stopped 01/08/24 1246)  sodium chloride 0.9 % bolus 1,000 mL (0 mLs Intravenous Stopped 01/08/24 1130)  iohexol (OMNIPAQUE) 350 MG/ML injection 75 mL (75 mLs Intravenous Contrast Given 01/08/24 1155)  acetaminophen (TYLENOL) tablet 650 mg (650 mg Oral Given 01/08/24 1336)  LORazepam (ATIVAN) tablet 1 mg (1 mg Oral Given 01/08/24 1607)    ED Course/ Medical Decision Making/ A&P                                 Medical Decision Making Amount and/or Complexity of Data Reviewed Labs: ordered.    Details: Elevated lactate that cleared Normal WBC Radiology: ordered  and independent interpretation performed.    Details: No intra-abdominal abscess  Risk OTC drugs. Prescription drug management.   Patient presents with concern for an abdominal wall infection.  CT has been obtained and does not show a large fluid collection.  His lactate is interestingly high and he had a significant tachycardia but with fluids and time his heart rate nearly normalized.  White blood cell count is interestingly normal and he is afebrile.  He does not have any significant pain to that area.  I ultimately  discussed with general surgery, Barnetta Chapel, who was discussing with surgical team.  They feel that most likely this is not even infected but would be reasonable to cover with Augmentin.  Does not need a surgical procedure or I&D.  His heart rate has come down significantly and while remains a little tachycardic he states he is also feeling a little more anxious because he is wanting to get out of here.  He does take chronic anxiety medicine.  I did give him a dose of oral Ativan and I believe he is stable for discharge.  While he did have a lactic acidosis, this completely cleared and I think with no obvious cause of his systemic infection, I do not think this represents sepsis.  Will discharge to follow-up with general surgery.  Given return precautions.         Final Clinical Impression(s) / ED Diagnoses Final diagnoses:  Abdominal wall cellulitis    Rx / DC Orders ED Discharge Orders          Ordered    amoxicillin-clavulanate (AUGMENTIN) 875-125 MG tablet  Every 12 hours        01/08/24 1500              Pricilla Loveless, MD 01/08/24 [phone]

## 2024-01-10 NOTE — Telephone Encounter (Signed)
Copied from CRM [id number]. Topic: Clinical - Prescription Issue >> Jan 10, 2024  2:08 PM Antony Haste wrote: Reason for CRM: PT is requesting a refill for his anxiety medication - Lexapro and another medication he was prescribed previously for neuropathy in his feet, he is unsure of the name of this medication but states his PCP knows. He would like this sent to: [facility]  - [address], [address], [place] [address]. He would like to receive a callback when this has been sent over. CB #[phone]

## 2024-01-11 ENCOUNTER — Telehealth: Payer: Self-pay | Admitting: *Deleted

## 2024-01-11 ENCOUNTER — Other Ambulatory Visit: Payer: Self-pay

## 2024-01-11 MED ORDER — ESCITALOPRAM OXALATE 20 MG PO TABS
20.0000 mg | ORAL_TABLET | Freq: Every day | ORAL | 1 refills | Status: DC
  Filled 2024-02-12: qty 90, 90d supply, fill #0

## 2024-01-11 NOTE — Telephone Encounter (Signed)
Pt called regarding After Visit Summary (AVS) instructions on when to call [facility].  RNCM advised to call as needed for check-up as stated on AVS.  Pt verbalizes understanding of instructions.

## 2024-01-13 LAB — CULTURE, BLOOD (ROUTINE X 2)
Culture: NO GROWTH
Culture: NO GROWTH
Special Requests: ADEQUATE

## 2024-01-21 ENCOUNTER — Other Ambulatory Visit (INDEPENDENT_AMBULATORY_CARE_PROVIDER_SITE_OTHER): Payer: MEDICAID

## 2024-01-21 ENCOUNTER — Ambulatory Visit (INDEPENDENT_AMBULATORY_CARE_PROVIDER_SITE_OTHER): Payer: MEDICAID | Admitting: Orthopedic Surgery

## 2024-01-21 DIAGNOSIS — R7303 Prediabetes: Secondary | ICD-10-CM | POA: Diagnosis not present

## 2024-01-21 DIAGNOSIS — M170 Bilateral primary osteoarthritis of knee: Secondary | ICD-10-CM

## 2024-01-21 DIAGNOSIS — G8929 Other chronic pain: Secondary | ICD-10-CM

## 2024-01-21 DIAGNOSIS — M25562 Pain in left knee: Secondary | ICD-10-CM | POA: Diagnosis not present

## 2024-01-21 DIAGNOSIS — M25561 Pain in right knee: Secondary | ICD-10-CM

## 2024-01-22 ENCOUNTER — Encounter: Payer: Self-pay | Admitting: Orthopedic Surgery

## 2024-01-22 LAB — HEMOGLOBIN A1C
Hgb A1c MFr Bld: 5.7 %{Hb} — ABNORMAL HIGH (ref <5.7)
Mean Plasma Glucose: 117 mg/dL
eAG (mmol/L): 6.5 mmol/L
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
## 2024-01-23 ENCOUNTER — Ambulatory Visit (INDEPENDENT_AMBULATORY_CARE_PROVIDER_SITE_OTHER): Payer: MEDICAID | Admitting: Neurology

## 2024-01-23 ENCOUNTER — Encounter: Payer: Self-pay | Admitting: Neurology

## 2024-01-23 VITALS — BP 141/91 | HR 100 | Ht 66.0 in | Wt 138.0 lb

## 2024-01-23 DIAGNOSIS — G621 Alcoholic polyneuropathy: Secondary | ICD-10-CM

## 2024-01-23 MED ORDER — GABAPENTIN 300 MG PO CAPS: ORAL_CAPSULE | ORAL | 1 refills | Status: DC

## 2024-01-23 NOTE — Patient Instructions (Signed)
 Take gabapentin 300mg  in the morning, 300mg  in the afternoon, and 600mg  at bedtime  Check your wound daily.  Follow-up with Dr. Lajoyce Corners or your primary care provider, if you notice any signs of infection.
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
## 2024-02-07 ENCOUNTER — Encounter (HOSPITAL_COMMUNITY): Payer: Self-pay

## 2024-02-07 NOTE — Progress Notes (Signed)
 Surgical Instructions   Your procedure is scheduled on Friday, February 15, 2024. Report to [facility] Main Entrance "A" at 6:30 A.M., then check in with the Admitting office. Any questions or running late day of surgery: call [phone]  Questions prior to your surgery date: call [phone], Monday-Friday, 8am-4pm. If you experience any cold or flu symptoms such as cough, fever, chills, shortness of breath, etc. between now and your scheduled surgery, please notify us at the above number.     Remember:  Do not eat after midnight the night before your surgery  You may drink clear liquids until 5:30 the morning of your surgery.   Clear liquids allowed are: Water, Non-Citrus Juices (without pulp), Carbonated Beverages, Clear Tea (no milk, honey, etc.), Black Coffee Only (NO MILK, CREAM OR POWDERED CREAMER of any kind), and Gatorade.    Take these medicines the morning of surgery with A SIP OF WATER  escitalopram (LEXAPRO)  gabapentin (NEURONTIN)    May take these medicines IF NEEDED: acetaminophen (TYLENOL)  cyclobenzaprine (FLEXERIL)    One week prior to surgery, STOP taking any Aspirin (unless otherwise instructed by your surgeon) Aleve, Naproxen, Ibuprofen, Motrin, Advil, Goody's, BC's, all herbal medications, fish oil, and non-prescription vitamins.                     Do NOT Smoke (Tobacco/Vaping) for 24 hours prior to your procedure.  If you use a CPAP at night, you may bring your mask/headgear for your overnight stay.   You will be asked to remove any contacts, glasses, piercing's, hearing aid's, dentures/partials prior to surgery. Please bring cases for these items if needed.    Patients discharged the day of surgery will not be allowed to drive home, and someone needs to stay with them for 24 hours.  SURGICAL WAITING ROOM VISITATION Patients may have no more than 2 support people in the waiting area - these visitors may rotate.   Pre-op nurse will coordinate an  appropriate time for 1 ADULT support person, who may not rotate, to accompany patient in pre-op.  Children under the age of 79 must have an adult with them who is not the patient and must remain in the main waiting area with an adult.  If the patient needs to stay at the hospital during part of their recovery, the visitor guidelines for inpatient rooms apply.  Please refer to the [facility] website for the visitor guidelines for any additional information.   If you received a COVID test during your pre-op visit  it is requested that you wear a mask when out in public, stay away from anyone that may not be feeling well and notify your surgeon if you develop symptoms. If you have been in contact with anyone that has tested positive in the last 10 days please notify you surgeon.      Pre-operative 5 CHG Bathing Instructions   You can play a key role in reducing the risk of infection after surgery. Your skin needs to be as free of germs as possible. You can reduce the number of germs on your skin by washing with CHG (chlorhexidine gluconate) soap before surgery. CHG is an antiseptic soap that kills germs and continues to kill germs even after washing.   DO NOT use if you have an allergy to chlorhexidine/CHG or antibacterial soaps. If your skin becomes reddened or irritated, stop using the CHG and notify one of our RNs at [phone].   Please shower with  the CHG soap starting 4 days before surgery using the following schedule:     Please keep in mind the following:  DO NOT shave, including legs and underarms, starting the day of your first shower.   You may shave your face at any point before/day of surgery.  Place clean sheets on your bed the day you start using CHG soap. Use a clean washcloth (not used since being washed) for each shower. DO NOT sleep with pets once you start using the CHG.   CHG Shower Instructions:  Wash your face and private area with normal soap. If you choose to  wash your hair, wash first with your normal shampoo.  After you use shampoo/soap, rinse your hair and body thoroughly to remove shampoo/soap residue.  Turn the water OFF and apply about 3 tablespoons (45 ml) of CHG soap to a CLEAN washcloth.  Apply CHG soap ONLY FROM YOUR NECK DOWN TO YOUR TOES (washing for 3-5 minutes)  DO NOT use CHG soap on face, private areas, open wounds, or sores.  Pay special attention to the area where your surgery is being performed.  If you are having back surgery, having someone wash your back for you may be helpful. Wait 2 minutes after CHG soap is applied, then you may rinse off the CHG soap.  Pat dry with a clean towel  Put on clean clothes/pajamas   If you choose to wear lotion, please use ONLY the CHG-compatible lotions that are listed below.  Additional instructions for the day of surgery: DO NOT APPLY any lotions, deodorants, cologne, or perfumes.   Do not bring valuables to the hospital. [facility] is not responsible for any belongings/valuables. Do not wear nail polish, gel polish, artificial nails, or any other type of covering on natural nails (fingers and toes) Do not wear jewelry or makeup Put on clean/comfortable clothes.  Please brush your teeth.  Ask your nurse before applying any prescription medications to the skin.     CHG Compatible Lotions   Aveeno Moisturizing lotion  Cetaphil Moisturizing Cream  Cetaphil Moisturizing Lotion  Clairol Herbal Essence Moisturizing Lotion, Dry Skin  Clairol Herbal Essence Moisturizing Lotion, Extra Dry Skin  Clairol Herbal Essence Moisturizing Lotion, Normal Skin  Curel Age Defying Therapeutic Moisturizing Lotion with Alpha Hydroxy  Curel Extreme Care Body Lotion  Curel Soothing Hands Moisturizing Hand Lotion  Curel Therapeutic Moisturizing Cream, Fragrance-Free  Curel Therapeutic Moisturizing Lotion, Fragrance-Free  Curel Therapeutic Moisturizing Lotion, Original Formula  Eucerin Daily Replenishing  Lotion  Eucerin Dry Skin Therapy Plus Alpha Hydroxy Crme  Eucerin Dry Skin Therapy Plus Alpha Hydroxy Lotion  Eucerin Original Crme  Eucerin Original Lotion  Eucerin Plus Crme Eucerin Plus Lotion  Eucerin TriLipid Replenishing Lotion  Keri Anti-Bacterial Hand Lotion  Keri Deep Conditioning Original Lotion Dry Skin Formula Softly Scented  Keri Deep Conditioning Original Lotion, Fragrance Free Sensitive Skin Formula  Keri Lotion Fast Absorbing Fragrance Free Sensitive Skin Formula  Keri Lotion Fast Absorbing Softly Scented Dry Skin Formula  Keri Original Lotion  Keri Skin Renewal Lotion Keri Silky Smooth Lotion  Keri Silky Smooth Sensitive Skin Lotion  Nivea Body Creamy Conditioning Oil  Nivea Body Extra Enriched Teacher, adult education Moisturizing Lotion Nivea Crme  Nivea Skin Firming Lotion  NutraDerm 30 Skin Lotion  NutraDerm Skin Lotion  NutraDerm Therapeutic Skin Cream  NutraDerm Therapeutic Skin Lotion  ProShield Protective Hand Cream  Provon moisturizing lotion  Please read over the following  fact sheets that you were given.

## 2024-02-08 ENCOUNTER — Inpatient Hospital Stay (HOSPITAL_COMMUNITY)
Admission: RE | Admit: 2024-02-08 | Discharge: 2024-02-08 | Disposition: A | Discharge status: Home / Self Care | Payer: MEDICAID | Source: Ambulatory Visit

## 2024-02-08 HISTORY — DX: Other complications of anesthesia, initial encounter: T88.59XA

## 2024-02-12 ENCOUNTER — Other Ambulatory Visit: Payer: Self-pay | Admitting: Nurse Practitioner

## 2024-02-12 ENCOUNTER — Other Ambulatory Visit (HOSPITAL_COMMUNITY): Payer: Self-pay

## 2024-02-12 ENCOUNTER — Other Ambulatory Visit: Payer: Self-pay

## 2024-02-12 NOTE — Telephone Encounter (Signed)
 This has already been ordered by another provider

## 2024-02-14 ENCOUNTER — Other Ambulatory Visit: Payer: Self-pay

## 2024-02-15 ENCOUNTER — Other Ambulatory Visit: Payer: Self-pay

## 2024-02-15 ENCOUNTER — Ambulatory Visit (HOSPITAL_COMMUNITY): Admission: RE | Admit: 2024-02-15 | Payer: MEDICAID | Source: Home / Self Care | Admitting: Orthopedic Surgery

## 2024-02-15 ENCOUNTER — Encounter: Payer: Self-pay | Admitting: Nurse Practitioner

## 2024-02-15 ENCOUNTER — Encounter (HOSPITAL_COMMUNITY): Admission: RE | Payer: Self-pay | Source: Home / Self Care

## 2024-02-15 ENCOUNTER — Ambulatory Visit: Payer: MEDICAID | Attending: Nurse Practitioner | Admitting: Nurse Practitioner

## 2024-02-15 VITALS — BP 137/89 | HR 86 | Ht 66.0 in | Wt 140.0 lb

## 2024-02-15 DIAGNOSIS — I10 Essential (primary) hypertension: Secondary | ICD-10-CM

## 2024-02-15 DIAGNOSIS — F419 Anxiety disorder, unspecified: Secondary | ICD-10-CM

## 2024-02-15 DIAGNOSIS — F32A Depression, unspecified: Secondary | ICD-10-CM

## 2024-02-15 DIAGNOSIS — M542 Cervicalgia: Secondary | ICD-10-CM

## 2024-02-15 SURGERY — ARTHROPLASTY, KNEE, TOTAL
Anesthesia: Choice | Site: Knee | Laterality: Left

## 2024-02-15 MED ORDER — ESCITALOPRAM OXALATE 20 MG PO TABS
30.0000 mg | ORAL_TABLET | Freq: Every day | ORAL | 1 refills | Status: DC
  Filled 2024-02-15 (×2): qty 90, 60d supply, fill #0
  Filled 2024-07-09: qty 90, 60d supply, fill #1

## 2024-02-15 MED ORDER — CYCLOBENZAPRINE HCL 10 MG PO TABS
5.0000 mg | ORAL_TABLET | Freq: Three times a day (TID) | ORAL | 0 refills | Status: DC | PRN
  Filled 2024-02-15: qty 90, 30d supply, fill #0

## 2024-02-15 MED ORDER — BLOOD PRESSURE MONITOR DEVI: 0 refills | Status: DC

## 2024-02-15 MED ORDER — AMLODIPINE BESYLATE 5 MG PO TABS
5.0000 mg | ORAL_TABLET | Freq: Every day | ORAL | 1 refills | Status: DC
  Filled 2024-02-15: qty 90, 90d supply, fill #0

## 2024-02-15 NOTE — Progress Notes (Signed)
 Assessment & Plan:  [name] [name] was seen today for medical management of chronic issues.  Diagnoses and all orders for this visit:  Essential hypertension -     amLODipine  (NORVASC ) 5 MG tablet; Take 1 tablet (5 mg total) by mouth daily. -     Blood Pressure Monitor DEVI; Please provide patient with insurance approved blood pressure monitor Continue all antihypertensives as prescribed.  Reminded to bring in blood pressure log for follow  up appointment.  RECOMMENDATIONS: DASH/Mediterranean Diets are healthier choices for HTN.    Anxiety and depression -     escitalopram  (LEXAPRO ) 20 MG tablet; Take 1.5 tablets (30 mg total) by mouth daily.  Neck pain -     DG Cervical Spine Complete; Future    Patient has been counseled on age-appropriate routine health concerns for screening and prevention. These are reviewed and up-to-date. Referrals have been placed accordingly. Immunizations are up-to-date or declined.    Subjective:   Chief Complaint  Patient presents with   Medical Management of Chronic Issues    [name] [name] 63 y.o. 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     HTN Blood pressure is well controlled. He endorses adherence taking amlodipine  5 mg daily  BP Readings from Last 3 Encounters:  03/28/24 134/79  02/15/24 137/89  01/23/24 (!) 141/91     Anxiety and Depression  Does not feel lexapro  is effective in reducing mood lability and anxiety. Currently taking 20 mg daily as prescribed.      02/15/2024    2:45 PM 05/10/2023   10:10 AM  Depression screen PHQ 2/9  Decreased Interest  2 3   Down, Depressed, Hopeless  1 3  PHQ - 2 Score  3 6  Altered sleeping  2 2  Tired, decreased energy  1 3  Change in appetite  1 3  Feeling bad or failure about yourself   0 1  Trouble concentrating  0 2  Moving slowly or fidgety/restless  0 3  Suicidal thoughts  0 0  PHQ-9 Score  7 20  Difficult doing work/chores  Very difficult         02/15/2024    2:46 PM 05/10/2023   10:10 AM 02/21/2023    3:44 PM  GAD 7 : Generalized Anxiety Score  Nervous, Anxious, on Edge  3 3 2   Control/stop worrying  3 2 2   Worry too much - different things  2 3 2   Trouble relaxing  2 3 2   Restless  1 2 2   Easily annoyed or irritable  0 1 1  Afraid - awful might happen  0 0 0  Total GAD 7 Score  11 14 11   Anxiety Difficulty  Very difficult      Neck Pain He has chronic neck pain. Mostly left sided. Pain does not radiate and is worsened with movement. States he has a history of similar kind of neck pain in the past that has gone away on its own.   Review of Systems  Constitutional:  Negative for fever, malaise/fatigue and weight loss.  HENT: Negative.  Negative for nosebleeds.   Eyes: Negative.  Negative for blurred vision, double vision and  photophobia.  Respiratory: Negative.  Negative for cough and shortness of breath.   Cardiovascular: Negative.  Negative for chest pain, palpitations and leg swelling.  Gastrointestinal: Negative.  Negative for heartburn, nausea and vomiting.  Musculoskeletal:  Positive for neck pain. Negative for myalgias.  Neurological: Negative.  Negative for dizziness, focal weakness, seizures and headaches.  Psychiatric/Behavioral:  Positive for depression. Negative for suicidal ideas. The patient is nervous/anxious.     Past Medical History:  Diagnosis Date   Alcohol  abuse    Anxiety and depression    Bilateral primary osteoarthritis of knee    Carpal tunnel syndrome of right wrist Dx 2015   Cervical stenosis of spine    MRI 10/2021   Complication of anesthesia     Essential hypertension    Gallbladder sludge    GERD (gastroesophageal reflux disease) Dx 2007   Hepatic steatosis    History of alcoholic hepatitis    History of CVA (cerebrovascular accident)    05/2021 MRI brain: Mild chronic microvascular ischemic change in the white matter. Small chronic infarct left superior cerebellum.  Also, chronic microhemorrhage, ? 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   Sarcoidosis of lung (HCC) Dx 1989   Seborrheic dermatitis     Past Surgical History:  Procedure Laterality Date   carpel tunnel release Right 07/28/2014    done in [place]    ELBOW SURGERY     EMBOLIZATION Bilateral 10/09/2023   Procedure: bilateral internal iliac embolization;  Surgeon: Federico Hopkins, MD;  Location: [facility] OR;  Service: Radiology;  Laterality: Bilateral;   INSERTION OF TRACTION PIN Right 10/08/2023   Procedure: INSERTION OF TRACTION PIN RIGHT FEMUR;  Surgeon: Gaylon Kea, MD;  Location: [facility] OR;  Service: Orthopedics;  Laterality: Right;   IR ANGIOGRAM PELVIS SELECTIVE OR SUPRASELECTIVE  10/09/2023   IR ANGIOGRAM SELECTIVE EACH ADDITIONAL VESSEL  10/09/2023   IR ANGIOGRAM SELECTIVE EACH ADDITIONAL VESSEL  10/09/2023   IR HYBRID TRAUMA EMBOLIZATION  10/09/2023   IR US  GUIDE VASC ACCESS LEFT  10/09/2023   KNEE ARTHROSCOPY     KNEE SURGERY Left    15 years ago   LAPAROTOMY N/A 10/08/2023   Procedure: EXPLORATORY LAPAROTOMY, PELVIC PACKING, TEMPORARY ABDOMINAL CLOSURE; EXPLORATION OF PELVIS;  Surgeon: Junie Olds, MD;  Location: [facility] OR;  Service: General;  Laterality: N/A;   LAPAROTOMY N/A 10/10/2023   Procedure: EXPLORATORY LAPAROTOMY - OPEN ABDOMEN ABTHERA;  Surgeon: Dorena Gander, MD;  [facility] OR;  Service: General;   Laterality: N/A;   LAPAROTOMY N/A 10/12/2023   Procedure: EXPLORATORY LAPAROTOMY WITH VAC CHANGE;  Surgeon: Dorena Gander, MD;  Location: [facility] OR;  Service: General;  Laterality: N/A;   LAPAROTOMY N/A 10/15/2023   Procedure: EXPLORATORY LAPAROTOMY;  Surgeon: Anda Bamberg, MD;  Location: [facility] OR;  Service: General;  Laterality: N/A;   LIPOMA EXCISION N/A 05/10/2016   Procedure: EXCISION OF SCALP LIPOMA;  Surgeon: Jacolyn Matar, MD;  Location: [facility];  Service: General;  Laterality: N/A;   OPEN REDUCTION INTERNAL FIXATION ACETABULUM FRACTURE POSTERIOR Right 10/17/2023   Procedure: OPEN REDUCTION INTERNAL FIXATION  RIGHT ACETABULUM FRACTURE;  Surgeon: Laneta Pintos, MD;  Location: [facility] OR;  Service: Orthopedics;  Laterality: Right;   ORIF PELVIC FRACTURE Bilateral 10/17/2023   Procedure: OPEN REDUCTION INTERNAL FIXATION (ORIF) PELVIC FRACTURE;  Surgeon: Laneta Pintos, MD;  Location: [facility] OR;  Service: Orthopedics;  Laterality: Bilateral;   THORACIC AORTIC ENDOVASCULAR STENT GRAFT N/A 10/09/2023   Procedure: THORACIC AORTIC ENDOVASCULAR STENT GRAFT;  Surgeon: Kayla Part, MD;  Location[facility] OR;  Service: Vascular;  Laterality: N/A;   WOUND EXPLORATION  10/08/2023   Procedure: EXPLORATION OF PELVIS;  Surgeon: Mallie Seal, MD;  Location: [facility] OR;  Service: Urology;;    Family History  Problem Relation Age of Onset   Diabetes Father    Hypertension Father    Hypertension Paternal Grandfather    Alcohol  abuse Paternal Grandfather    Alcohol  abuse Maternal Grandfather    Alcohol  abuse Maternal Grandmother    Alcohol  abuse Paternal Grandmother    Colon cancer Neg Hx    Rectal cancer Neg Hx    Stomach cancer Neg Hx    Bipolar disorder Neg Hx    Depression Neg Hx     Social History Reviewed with no changes to be made today.   Outpatient Medications Prior to Visit  Medication Sig Dispense Refill   acetaminophen  (TYLENOL ) 500 MG tablet Take 500-1,000 mg by mouth  every 6 (six) hours as needed (pain.).     gabapentin  (NEURONTIN ) 300 MG capsule Take 1 tablet in the morning, 1 tablet in the afternoon, and 2 at bedtime. 360 capsule 1   escitalopram  (LEXAPRO ) 20 MG tablet Take 1 tablet (20 mg total) by mouth daily. 90 tablet 1   B Complex-C (B-COMPLEX WITH VITAMIN C ) tablet Take 1 tablet by mouth daily. (Patient not taking: Reported on 02/15/2024)     cyclobenzaprine  (FLEXERIL ) 10 MG tablet Take 1 tablet (10 mg total) by mouth 3 (three) times daily as needed for muscle spasms. (Patient not taking: Reported on 02/15/2024)     No facility-administered medications prior to visit.    Allergies  Allergen Reactions   Oxycodone  Other (See Comments)    abd pain, stomach cramps        Objective:    BP 137/89 (BP Location: Left Arm, Patient Position: Sitting, Cuff Size: Normal)   Pulse 86   Ht 5\' 6"  (1.676 m)   Wt 140 lb (63.5 kg)   SpO2 100%   BMI 22.60 kg/m  Wt Readings from Last 3 Encounters:  03/28/24 140 lb (63.5 kg)  02/15/24 140 lb (63.5 kg)  01/23/24 138 lb (62.6 kg)    Physical Exam Vitals and nursing note reviewed.  Constitutional:      Appearance: He is well-developed.  HENT:     Head: Normocephalic and atraumatic.  Cardiovascular:     Rate and Rhythm: Normal rate and regular rhythm.     Heart sounds: Normal heart sounds. No murmur heard.    No friction rub. No gallop.  Pulmonary:     Effort: Pulmonary effort is normal. No tachypnea or respiratory distress.     Breath sounds: Normal breath sounds. No decreased breath sounds, wheezing, rhonchi or rales.  Chest:     Chest wall: No tenderness.  Abdominal:     General: Bowel sounds are normal.     Palpations: Abdomen is soft.  Musculoskeletal:        General: Normal range of motion.     Cervical back: Normal range of motion.  Skin:    General: Skin is warm and dry.  Neurological:     Mental  Status: He is alert and oriented to person, place, and time.     Coordination:  Coordination normal.  Psychiatric:        Behavior: Behavior normal. Behavior is cooperative.        Thought Content: Thought content normal.        Judgment: Judgment normal.          Patient has been counseled extensively about nutrition and exercise as well as the importance of adherence with medications and regular follow-up. The patient was given clear instructions to go to ER or return to medical center if symptoms don't improve, worsen or new problems develop. The patient verbalized understanding.   Follow-up: Return in about 6 weeks (around 03/28/2024).   Collins Dean, FNP-BC [facility] and [facility] [place], [place] [phone]   03/31/2024, 11:08 PM

## 2024-02-20 ENCOUNTER — Other Ambulatory Visit: Payer: Self-pay

## 2024-02-28 ENCOUNTER — Encounter: Payer: MEDICAID | Admitting: Orthopedic Surgery

## 2024-02-29 ENCOUNTER — Encounter: Payer: MEDICAID | Admitting: Family

## 2024-03-06 ENCOUNTER — Ambulatory Visit: Payer: MEDICAID | Admitting: Orthopedic Surgery

## 2024-03-11 ENCOUNTER — Telehealth (INDEPENDENT_AMBULATORY_CARE_PROVIDER_SITE_OTHER): Payer: Self-pay

## 2024-03-11 NOTE — Telephone Encounter (Unsigned)
 Copied from CRM [id number]. Topic: General - Other >> Mar 11, 2024 11:21 AM [name] [name] wrote: Reason for CRM: Patient calling in to see where he needs to go to complete his imaging. Did not see a location on the order. He was unsure if he needed to schedule or walk in. Please call to advise.

## 2024-03-20 ENCOUNTER — Encounter: Payer: Self-pay | Admitting: Orthopedic Surgery

## 2024-03-20 ENCOUNTER — Ambulatory Visit: Payer: MEDICAID | Admitting: Orthopedic Surgery

## 2024-03-20 DIAGNOSIS — M25562 Pain in left knee: Secondary | ICD-10-CM | POA: Diagnosis not present

## 2024-03-20 DIAGNOSIS — M25561 Pain in right knee: Secondary | ICD-10-CM | POA: Diagnosis not present

## 2024-03-20 DIAGNOSIS — G8929 Other chronic pain: Secondary | ICD-10-CM

## 2024-03-20 DIAGNOSIS — M170 Bilateral primary osteoarthritis of knee: Secondary | ICD-10-CM | POA: Diagnosis not present
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
## 2024-03-25 ENCOUNTER — Ambulatory Visit
Admission: RE | Admit: 2024-03-25 | Discharge: 2024-03-25 | Disposition: A | Discharge status: Home / Self Care | Payer: MEDICAID | Source: Ambulatory Visit | Attending: Nurse Practitioner | Admitting: Nurse Practitioner

## 2024-03-25 DIAGNOSIS — M542 Cervicalgia: Secondary | ICD-10-CM

## 2024-03-28 ENCOUNTER — Ambulatory Visit: Payer: MEDICAID | Attending: Nurse Practitioner | Admitting: Nurse Practitioner

## 2024-03-28 ENCOUNTER — Other Ambulatory Visit: Payer: Self-pay

## 2024-03-28 ENCOUNTER — Encounter: Payer: Self-pay | Admitting: Nurse Practitioner

## 2024-03-28 VITALS — BP 134/79 | HR 96 | Resp 20 | Ht 66.0 in | Wt 140.0 lb

## 2024-03-28 DIAGNOSIS — F1094 Alcohol use, unspecified with alcohol-induced mood disorder: Secondary | ICD-10-CM | POA: Diagnosis not present

## 2024-03-28 DIAGNOSIS — I10 Essential (primary) hypertension: Secondary | ICD-10-CM | POA: Diagnosis not present

## 2024-03-28 MED ORDER — BUPROPION HCL ER (XL) 150 MG PO TB24
150.0000 mg | ORAL_TABLET | Freq: Every day | ORAL | 1 refills | Status: DC
  Filled 2024-03-28: qty 30, 30d supply, fill #0
  Filled 2024-07-09 (×2): qty 30, 30d supply, fill #1

## 2024-03-28 NOTE — Progress Notes (Signed)
 Assessment & Plan:  [name] [name] was seen today for hypertension.  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 -     Ambulatory referral to Behavioral Health    Patient has been counseled on age-appropriate routine health concerns for screening and prevention. These are reviewed and up-to-date. Referrals have been placed accordingly. Immunizations are up-to-date or declined.    Subjective:   Chief Complaint  Patient presents with   Hypertension    [name] [name] 63 y.o. male presents to office today for follow up to HTN, anxiety and depression.    HTN Blood pressure is well-controlled.  He is taking amlodipine  5 mg daily as prescribed. BP Readings from Last 3 Encounters:  03/28/24 134/79  02/15/24 137/89  01/23/24 (!) 141/91      He is still drinking and recently relapsed with alcohol .  Interested in naltrexone  or Librium .  I have instructed him that we do not prescribe Librium  here in this office.  Naltrexone  may be an option however we will need to perform a drug screen today to rule out any opioid use.  He does not feel Lexapro  30 mg is very effective.  PHQ-9 score unchanged since last visit and GAD score is actually down.    03/28/2024   11:10 AM 02/15/2024    2:45 PM 05/10/2023   10:10 AM  Depression screen PHQ 2/9  Decreased Interest 1 2 3   Down, Depressed, Hopeless 1 1 3   PHQ - 2 Score 2 3 6   Altered sleeping 1 2 2   Tired, decreased energy 1 1 3   Change in appetite 1 1 3   Feeling bad or failure about yourself  1 0 1  Trouble concentrating 1 0 2  Moving slowly or fidgety/restless 0 0 3  Suicidal thoughts 0 0 0  PHQ-9 Score 7 7 20   Difficult doing work/chores Somewhat difficult Very difficult        03/28/2024   11:10 AM 02/15/2024    2:46 PM 05/10/2023    10:10 AM 02/21/2023    3:44 PM  GAD 7 : Generalized Anxiety Score  Nervous, Anxious, on Edge 3 3 3 2   Control/stop worrying 2 3 2 2   Worry too much - different things 2 2 3 2   Trouble relaxing 2 2 3 2   Restless 1 1 2 2   Easily annoyed or irritable 0 0 1 1  Afraid - awful might happen 0 0 0 0  Total GAD 7 Score 10 11 14 11   Anxiety Difficulty Somewhat difficult Very difficult       Review of Systems  Constitutional:  Negative for fever, malaise/fatigue and weight loss.  HENT: Negative.  Negative for nosebleeds.   Eyes: Negative.  Negative for blurred vision, double vision and photophobia.  Respiratory: Negative.  Negative for cough and shortness of breath.   Cardiovascular: Negative.  Negative for chest pain, palpitations and leg swelling.  Gastrointestinal: Negative.  Negative for heartburn, nausea and vomiting.  Musculoskeletal: Negative.  Negative for myalgias.  Neurological: Negative.  Negative for dizziness, focal weakness, seizures and headaches.  Psychiatric/Behavioral:  Positive for depression and substance abuse. Negative for suicidal ideas. The patient is nervous/anxious.     Past Medical History:  Diagnosis Date   Alcohol  abuse  Anxiety and depression    Bilateral primary osteoarthritis of knee    Carpal tunnel syndrome of right wrist Dx 2015   Cervical stenosis of spine    MRI 10/2021   Complication of anesthesia    Essential hypertension    Gallbladder sludge    GERD (gastroesophageal reflux disease) Dx 2007   Hepatic steatosis    History of alcoholic hepatitis    History of CVA (cerebrovascular accident)    05/2021 MRI brain: Mild chronic microvascular ischemic change in the white matter. Small chronic infarct left superior cerebellum.  Also, chronic microhemorrhage, ? past trauma   History of homeless    History of pancreatitis    Hyperlipidemia    borderline, diet controlled   Medial meniscus tear    2016   Neuropathy, alcoholic (HCC)    NCS 2022 R leg    Pneumonia 11/2014   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   Sarcoidosis of lung (HCC) Dx 1989   Seborrheic dermatitis     Past Surgical History:  Procedure Laterality Date   carpel tunnel release Right 07/28/2014    done in [place]    ELBOW SURGERY     EMBOLIZATION Bilateral 10/09/2023   Procedure: bilateral internal iliac embolization;  Surgeon: Federico Hopkins, MD;  Location: [facility] OR;  Service: Radiology;  Laterality: Bilateral;   INSERTION OF TRACTION PIN Right 10/08/2023   Procedure: INSERTION OF TRACTION PIN RIGHT FEMUR;  Surgeon: Gaylon Kea, MD;  [facility]: [facility];  Service: Orthopedics;  Laterality: Right;   IR ANGIOGRAM PELVIS SELECTIVE OR SUPRASELECTIVE  10/09/2023   IR ANGIOGRAM SELECTIVE EACH ADDITIONAL VESSEL  10/09/2023   IR ANGIOGRAM SELECTIVE EACH ADDITIONAL VESSEL  10/09/2023   IR HYBRID TRAUMA EMBOLIZATION  10/09/2023   IR US  GUIDE VASC ACCESS LEFT  10/09/2023   KNEE ARTHROSCOPY     KNEE SURGERY Left    15 years ago   LAPAROTOMY N/A 10/08/2023   Procedure: EXPLORATORY LAPAROTOMY, PELVIC PACKING, TEMPORARY ABDOMINAL CLOSURE; EXPLORATION OF PELVIS;  Surgeon: Junie Olds, MD;  Location[facility] OR;  Service: General;  Laterality: N/A;   LAPAROTOMY N/A 10/10/2023   Procedure: EXPLORATORY LAPAROTOMY - OPEN ABDOMEN ABTHERA;  Surgeon: Dorena Gander, MD;  Location: [facility] OR;  Service: General;  Laterality: N/A;   LAPAROTOMY N/A 10/12/2023   Procedure: EXPLORATORY LAPAROTOMY WITH VAC CHANGE;  Surgeon: Dorena Gander, MD;  Location: [facility] OR;  Service: General;  Laterality: N/A;   LAPAROTOMY N/A 10/15/2023   Procedure: EXPLORATORY LAPAROTOMY;  Surgeon: Anda Bamberg, MD;  Location: [facility] OR;  Service: General;  Laterality: N/A;   LIPOMA EXCISION N/A 05/10/2016   Procedure: EXCISION OF SCALP LIPOMA;  Surgeon: Jacolyn Matar, MD;  Location: [facility];  Service: General;  Laterality: N/A;   OPEN REDUCTION INTERNAL FIXATION ACETABULUM FRACTURE  POSTERIOR Right 10/17/2023   Procedure: OPEN REDUCTION INTERNAL FIXATION  RIGHT ACETABULUM FRACTURE;  Surgeon: Laneta Pintos, MD;  [facility];  Service: Orthopedics;  Laterality: Right;   ORIF PELVIC FRACTURE Bilateral 10/17/2023   Procedure: OPEN REDUCTION INTERNAL FIXATION (ORIF) PELVIC FRACTURE;  Surgeon: Laneta Pintos, MD;  Location: [facility] OR;  Service: Orthopedics;  Laterality: Bilateral;   THORACIC AORTIC ENDOVASCULAR STENT GRAFT N/A 10/09/2023   Procedure: THORACIC AORTIC ENDOVASCULAR STENT GRAFT;  Surgeon: Kayla Part, MD;  Location: [facility] OR;  Service: Vascular;  Laterality: N/A;   WOUND EXPLORATION  10/08/2023   Procedure: EXPLORATION OF PELVIS;  Surgeon: Mallie Seal, MD;  Location:  [facility] OR;  Service: Urology;;    Family History  Problem Relation Age of Onset   Diabetes Father    Hypertension Father    Hypertension Paternal Grandfather    Alcohol  abuse Paternal Grandfather    Alcohol  abuse Maternal Grandfather    Alcohol  abuse Maternal Grandmother    Alcohol  abuse Paternal Grandmother    Colon cancer Neg Hx    Rectal cancer Neg Hx    Stomach cancer Neg Hx    Bipolar disorder Neg Hx    Depression Neg Hx     Social History Reviewed with no changes to be made today.   Outpatient Medications Prior to Visit  Medication Sig Dispense Refill   acetaminophen  (TYLENOL ) 500 MG tablet Take 500-1,000 mg by mouth every 6 (six) hours as needed (pain.).     amLODipine  (NORVASC ) 5 MG tablet Take 1 tablet (5 mg total) by mouth daily. 90 tablet 1   Blood Pressure Monitor DEVI Please provide patient with insurance approved blood pressure monitor 1 each 0   escitalopram  (LEXAPRO ) 20 MG tablet Take 1.5 tablets (30 mg total) by mouth daily. 90 tablet 1   gabapentin  (NEURONTIN ) 300 MG capsule Take 1 tablet in the morning, 1 tablet in the afternoon, and 2 at bedtime. 360 capsule 1   cyclobenzaprine  (FLEXERIL ) 10 MG tablet Take 0.5-1 tablets (5-10 mg total) by mouth 3 (three) times  daily as needed for muscle spasms. (Patient not taking: Reported on 03/28/2024) 90 tablet 0   No facility-administered medications prior to visit.    Allergies  Allergen Reactions   Oxycodone  Other (See Comments)    abd pain, stomach cramps        Objective:    BP 134/79 (BP Location: Left Arm, Patient Position: Sitting, Cuff Size: Normal)   Pulse 96   Resp 20   Ht 5\' 6"  (1.676 m)   Wt 140 lb (63.5 kg)   SpO2 100%   BMI 22.60 kg/m  Wt Readings from Last 3 Encounters:  03/28/24 140 lb (63.5 kg)  02/15/24 140 lb (63.5 kg)  01/23/24 138 lb (62.6 kg)    Physical Exam Vitals and nursing note reviewed.  Constitutional:      Appearance: He is well-developed.  HENT:     Head: Normocephalic and atraumatic.  Cardiovascular:     Rate and Rhythm: Normal rate and regular rhythm.     Heart sounds: Normal heart sounds. No murmur heard.    No friction rub. No gallop.  Pulmonary:     Effort: Pulmonary effort is normal. No tachypnea or respiratory distress.     Breath sounds: Normal breath sounds. No decreased breath sounds, wheezing, rhonchi or rales.  Chest:     Chest wall: No tenderness.  Abdominal:     General: Bowel sounds are normal.     Palpations: Abdomen is soft.  Musculoskeletal:        General: Normal range of motion.     Cervical back: Normal range of motion.  Skin:    General: Skin is warm and dry.  Neurological:     Mental Status: He is alert and oriented to person, place, and time.     Coordination: Coordination normal.  Psychiatric:        Behavior: Behavior normal. Behavior is cooperative.        Thought Content: Thought content normal.        Judgment: Judgment normal.          Patient has been counseled extensively  about nutrition and exercise as well as the importance of adherence with medications and regular follow-up. The patient was given clear instructions to go to ER or return to medical center if symptoms don't improve, worsen or new problems  develop. The patient verbalized understanding.   Follow-up: Return in about 4 months (around 07/29/2024).   Collins Dean, FNP-BC [facility] [place], [place] [phone]   03/28/2024, 2:08 PM

## 2024-03-31 ENCOUNTER — Other Ambulatory Visit (HOSPITAL_COMMUNITY): Payer: Self-pay

## 2024-03-31 ENCOUNTER — Encounter: Payer: Self-pay | Admitting: Nurse Practitioner

## 2024-03-31 ENCOUNTER — Other Ambulatory Visit: Payer: Self-pay

## 2024-04-02 ENCOUNTER — Encounter (HOSPITAL_COMMUNITY): Payer: Self-pay

## 2024-04-02 NOTE — Progress Notes (Signed)
 Surgical Instructions   Your procedure is scheduled on Friday Apr 11, 2024. Report to [facility] Main Entrance "A" at 5:30 A.M., then check in with the Admitting office. Any questions or running late day of surgery: call [phone]  Questions prior to your surgery date: call [phone], Monday-Friday, 8am-4pm. If you experience any cold or flu symptoms such as cough, fever, chills, shortness of breath, etc. between now and your scheduled surgery, please notify us  at the above number.     Remember:  Do not eat after midnight the night before your surgery  You may drink clear liquids until 4:30 the morning of your surgery.   Clear liquids allowed are: Water , Non-Citrus Juices (without pulp), Carbonated Beverages, Clear Tea (no milk, honey, etc.), Black Coffee Only (NO MILK, CREAM OR POWDERED CREAMER of any kind), and Gatorade.    Take these medicines the morning of surgery with A SIP OF WATER   amLODipine  (NORVASC )  buPROPion  (WELLBUTRIN  XL) gabapentin  (NEURONTIN )  escitalopram  (LEXAPRO )    May take these medicines IF NEEDED: acetaminophen  (TYLENOL )    One week prior to surgery, STOP taking any Aspirin  (unless otherwise instructed by your surgeon) Aleve , Naproxen , Ibuprofen , Motrin , Advil , Goody's, BC's, all herbal medications, fish oil , and non-prescription vitamins.                     Do NOT Smoke (Tobacco/Vaping) for 24 hours prior to your procedure.  If you use a CPAP at night, you may bring your mask/headgear for your overnight stay.   You will be asked to remove any contacts, glasses, piercing's, hearing aid's, dentures/partials prior to surgery. Please bring cases for these items if needed.    Patients discharged the day of surgery will not be allowed to drive home, and someone needs to stay with them for 24 hours.  SURGICAL WAITING ROOM VISITATION Patients may have no more than 2 support people in the waiting area - these visitors may rotate.   Pre-op nurse will  coordinate an appropriate time for 1 ADULT support person, who may not rotate, to accompany patient in pre-op.  Children under the age of 15 must have an adult with them who is not the patient and must remain in the main waiting area with an adult.  If the patient needs to stay at the hospital during part of their recovery, the visitor guidelines for inpatient rooms apply.  Please refer to the [facility] website for the visitor guidelines for any additional information.   If you received a COVID test during your pre-op visit  it is requested that you wear a mask when out in public, stay away from anyone that may not be feeling well and notify your surgeon if you develop symptoms. If you have been in contact with anyone that has tested positive in the last 10 days please notify you surgeon.      Pre-operative 5 CHG Bathing Instructions   You can play a key role in reducing the risk of infection after surgery. Your skin needs to be as free of germs as possible. You can reduce the number of germs on your skin by washing with CHG (chlorhexidine  gluconate) soap before surgery. CHG is an antiseptic soap that kills germs and continues to kill germs even after washing.   DO NOT use if you have an allergy to chlorhexidine /CHG or antibacterial soaps. If your skin becomes reddened or irritated, stop using the CHG and notify one of our RNs at [phone].  Please shower with the CHG soap starting 4 days before surgery using the following schedule:     Please keep in mind the following:  You may shave your face at any point before/day of surgery.  Place clean sheets on your bed the day you start using CHG soap. Use a clean washcloth (not used since being washed) for each shower. DO NOT sleep with pets once you start using the CHG.   CHG Shower Instructions:  Wash your face and private area with normal soap. If you choose to wash your hair, wash first with your normal shampoo.  After you use  shampoo/soap, rinse your hair and body thoroughly to remove shampoo/soap residue.  Turn the water  OFF and apply about 3 tablespoons (45 ml) of CHG soap to a CLEAN washcloth.  Apply CHG soap ONLY FROM YOUR NECK DOWN TO YOUR TOES (washing for 3-5 minutes)  DO NOT use CHG soap on face, private areas, open wounds, or sores.  Pay special attention to the area where your surgery is being performed.  If you are having back surgery, having someone wash your back for you may be helpful. Wait 2 minutes after CHG soap is applied, then you may rinse off the CHG soap.  Pat dry with a clean towel  Put on clean clothes/pajamas   If you choose to wear lotion, please use ONLY the CHG-compatible lotions that are listed below.  Additional instructions for the day of surgery: DO NOT APPLY any lotions, deodorants or cologne  Do not bring valuables to the hospital. [facility] is not responsible for any belongings/valuables. Do not wear jewelry Put on clean/comfortable clothes.  Please brush your teeth.  Ask your nurse before applying any prescription medications to the skin.     CHG Compatible Lotions   Aveeno Moisturizing lotion  Cetaphil Moisturizing Cream  Cetaphil Moisturizing Lotion  Clairol Herbal Essence Moisturizing Lotion, Dry Skin  Clairol Herbal Essence Moisturizing Lotion, Extra Dry Skin  Clairol Herbal Essence Moisturizing Lotion, Normal Skin  Curel Age Defying Therapeutic Moisturizing Lotion with Alpha Hydroxy  Curel Extreme Care Body Lotion  Curel Soothing Hands Moisturizing Hand Lotion  Curel Therapeutic Moisturizing Cream, Fragrance-Free  Curel Therapeutic Moisturizing Lotion, Fragrance-Free  Curel Therapeutic Moisturizing Lotion, Original Formula  Eucerin Daily Replenishing Lotion  Eucerin Dry Skin Therapy Plus Alpha Hydroxy Crme  Eucerin Dry Skin Therapy Plus Alpha Hydroxy Lotion  Eucerin Original Crme  Eucerin Original Lotion  Eucerin Plus Crme Eucerin Plus Lotion  Eucerin  TriLipid Replenishing Lotion  Keri Anti-Bacterial Hand Lotion  Keri Deep Conditioning Original Lotion Dry Skin Formula Softly Scented  Keri Deep Conditioning Original Lotion, Fragrance Free Sensitive Skin Formula  Keri Lotion Fast Absorbing Fragrance Free Sensitive Skin Formula  Keri Lotion Fast Absorbing Softly Scented Dry Skin Formula  Keri Original Lotion  Keri Skin Renewal Lotion Keri Silky Smooth Lotion  Keri Silky Smooth Sensitive Skin Lotion  Nivea Body Creamy Conditioning Oil  Nivea Body Extra Enriched Lotion  Nivea Body Original Lotion  Nivea Body Sheer Moisturizing Lotion Nivea Crme  Nivea Skin Firming Lotion  NutraDerm 30 Skin Lotion  NutraDerm Skin Lotion  NutraDerm Therapeutic Skin Cream  NutraDerm Therapeutic Skin Lotion  ProShield Protective Hand Cream  Provon moisturizing lotion  Please read over the following fact sheets that you were given.

## 2024-04-03 ENCOUNTER — Ambulatory Visit: Payer: Self-pay

## 2024-04-03 ENCOUNTER — Inpatient Hospital Stay (HOSPITAL_COMMUNITY)
Admission: RE | Admit: 2024-04-03 | Discharge: 2024-04-03 | Disposition: A | Discharge status: Home / Self Care | Payer: MEDICAID | Source: Ambulatory Visit

## 2024-04-03 HISTORY — DX: Cerebral infarction, unspecified: I63.9
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
## 2024-04-06 LAB — DRUG SCREEN 10 W/CONF, SERUM
Amphetamines, IA: NEGATIVE ng/mL
Barbiturates, IA: NEGATIVE ug/mL
Benzodiazepines, IA: NEGATIVE ng/mL
Cocaine & Metabolite, IA: NEGATIVE ng/mL
Methadone, IA: NEGATIVE ng/mL
Opiates, IA: NEGATIVE ng/mL
Oxycodones, IA: NEGATIVE ng/mL
Phencyclidine, IA: NEGATIVE ng/mL
Propoxyphene, IA: NEGATIVE ng/mL
THC(Marijuana) Metabolite, IA: NEGATIVE ng/mL

## 2024-04-07 ENCOUNTER — Other Ambulatory Visit: Payer: Self-pay | Admitting: Nurse Practitioner

## 2024-04-07 ENCOUNTER — Other Ambulatory Visit: Payer: Self-pay

## 2024-04-07 ENCOUNTER — Encounter: Payer: Self-pay | Admitting: Nurse Practitioner

## 2024-04-07 DIAGNOSIS — F1010 Alcohol abuse, uncomplicated: Secondary | ICD-10-CM

## 2024-04-07 DIAGNOSIS — M542 Cervicalgia: Secondary | ICD-10-CM

## 2024-04-07 MED ORDER — NALTREXONE HCL 50 MG PO TABS
50.0000 mg | ORAL_TABLET | Freq: Every day | ORAL | 0 refills | Status: DC
  Filled 2024-04-07: qty 30, 30d supply, fill #0

## 2024-04-11 ENCOUNTER — Other Ambulatory Visit: Payer: Self-pay

## 2024-04-11 ENCOUNTER — Encounter (HOSPITAL_COMMUNITY): Admission: RE | Payer: Self-pay | Source: Home / Self Care

## 2024-04-11 ENCOUNTER — Other Ambulatory Visit (INDEPENDENT_AMBULATORY_CARE_PROVIDER_SITE_OTHER): Payer: MEDICAID

## 2024-04-11 ENCOUNTER — Ambulatory Visit (HOSPITAL_COMMUNITY): Admission: RE | Admit: 2024-04-11 | Payer: MEDICAID | Source: Home / Self Care | Admitting: Orthopedic Surgery

## 2024-04-11 ENCOUNTER — Ambulatory Visit: Payer: MEDICAID | Admitting: Orthopedic Surgery

## 2024-04-11 VITALS — BP 139/90 | HR 121 | Ht 66.0 in | Wt 140.0 lb

## 2024-04-11 DIAGNOSIS — M542 Cervicalgia: Secondary | ICD-10-CM

## 2024-04-11 DIAGNOSIS — M436 Torticollis: Secondary | ICD-10-CM

## 2024-04-11 SURGERY — ARTHROPLASTY, KNEE, TOTAL
Anesthesia: Choice | Site: Knee | Laterality: Right

## 2024-04-11 MED ORDER — CYCLOBENZAPRINE HCL 10 MG PO TABS
10.0000 mg | ORAL_TABLET | Freq: Three times a day (TID) | ORAL | 0 refills | Status: DC | PRN
  Filled 2024-04-11: qty 50, 17d supply, fill #0
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
## 2024-04-15 ENCOUNTER — Ambulatory Visit (INDEPENDENT_AMBULATORY_CARE_PROVIDER_SITE_OTHER): Payer: MEDICAID | Admitting: Physician Assistant

## 2024-04-15 ENCOUNTER — Other Ambulatory Visit: Payer: Self-pay

## 2024-04-15 ENCOUNTER — Encounter (HOSPITAL_COMMUNITY): Payer: Self-pay | Admitting: Physician Assistant

## 2024-04-15 VITALS — BP 130/95 | HR 89 | Temp 98.1°F | Ht 66.0 in | Wt 147.4 lb

## 2024-04-15 DIAGNOSIS — F411 Generalized anxiety disorder: Secondary | ICD-10-CM | POA: Diagnosis not present

## 2024-04-15 DIAGNOSIS — F1010 Alcohol abuse, uncomplicated: Secondary | ICD-10-CM | POA: Diagnosis not present

## 2024-04-15 MED ORDER — NALTREXONE HCL 50 MG PO TABS: 50.0000 mg | ORAL_TABLET | Freq: Every day | ORAL | 0 refills | Status: DC

## 2024-04-15 MED ORDER — ESCITALOPRAM OXALATE 5 MG PO TABS
ORAL_TABLET | ORAL | 0 refills | Status: DC
  Filled 2024-04-15: qty 3, 3d supply, fill #0

## 2024-04-15 MED ORDER — SERTRALINE HCL 50 MG PO TABS
50.0000 mg | ORAL_TABLET | Freq: Every day | ORAL | 1 refills | Status: DC
  Filled 2024-04-15: qty 30, 30d supply, fill #0

## 2024-04-15 NOTE — Progress Notes (Signed)
 Psychiatric Initial Adult Assessment   Patient Identification: [name] [name] MRN:  [id number] Date of Evaluation:  04/15/2024 Referral Source: Referred by primary care provider Chief Complaint:   Chief Complaint  Patient presents with   Establish Care   Medication Management   Visit Diagnosis:    ICD-10-CM   1. Generalized anxiety disorder  F41.1 escitalopram  (LEXAPRO ) 5 MG tablet    sertraline (ZOLOFT) 50 MG tablet    2. Alcohol  abuse  F10.10 naltrexone  (DEPADE) 50 MG tablet     History of Present Illness:   Draycen Leichter is a 63 year old male with a past psychiatric history significant for anxiety who presents to [facility] to establish psychiatric care for medication management.  Patient presents to the encounter stating that he has been dealing with chronic anxiety and depression.  Out of the symptoms that he has been experiencing, patient reports that anxiety has been his main concern.  He reports that he is a recovering alcoholic and attributes his alcohol  use to his anxiety.  He reports that he has been dealing with anxiety since middle school.  He reports that he does not experience panic attacks anymore but his anxiety is still present.  Patient rates his anxiety in 8 out of 10.  Patient reports that he is currently being managed on the following psychiatric medications:  Gabapentin  300 mg 3 times daily Lexapro  30 mg daily Bupropion  (Wellbutrin  XL) 150 mg 24-hour tablet daily  In addition to these medications, patient reports that he has been on BuSpar  in the past.  He reports that BuSpar  was ineffective in managing his anxiety.  Due to his anxiety, patient reports that it is difficult for him to relax.  Patient reports that he has been assessed for depression in the past but does not believe that he is currently depressed.  He reports that his anxiety is his biggest issue which caused him to use alcohol  as a means to calm himself.  Patient  reports that he has been on escitalopram  for 10 years and believes that the medication has lost its effectiveness.  Patient denies a past history of hospitalization due to mental health.  Patient further denies a past history of suicide attempt.  A GAD-7 screen was performed the patient scoring an 11.  Patient is alert and oriented x 4, calm, cooperative, and fully engaged in conversation during the encounter.  Patient exhibits calm mood.  Patient exhibits anxious mood with appropriate affect.  Patient denies suicidal or homicidal ideations.  He further denies auditory or visual hallucinations and does not appear to be responding to internal/external stimuli.  Patient denies paranoia or delusional thoughts.  Patient endorses fair sleep and receives on average 5 to 6 hours of sleep per night.  Patient endorses fair appetite needs on average 2 meals per day.  Patient denies recent alcohol  consumption stating that it has been 3 weeks since he last drank.  Patient denies tobacco use or illicit drug use.  Associated Signs/Symptoms: Depression Symptoms:  psychomotor agitation, fatigue, difficulty concentrating, anxiety, disturbed sleep, (Hypo) Manic Symptoms:  Patient denies Anxiety Symptoms:  Patient denies Psychotic Symptoms:  Patient denies PTSD Symptoms: Negative  Past Psychiatric History:  Patient endorses a past psychiatric history significant for anxiety.  Patient denies a past history of hospitalization due to mental health.  Patient denies a past history of suicide attempt.  Patient denies a past history of homicide attempt.  Previous Psychotropic Medications: Yes , patient reports that he has been  on buspirone  in the past.  Substance Abuse History in the last 12 months:  Yes.    Consequences of Substance Abuse: Medical Consequences:  Patient reports that he has been hospitalized due to alcohol  use and has been through detox. Legal Consequences:  Patient reports that he received a  DUI in his late teens Family Consequences:  Patient endorses having strained relationships in the past due to his alcohol  use Blackouts:  Patient denies DT's: Patient denies Withdrawal Symptoms:   Tremors  Past Medical History:  Past Medical History:  Diagnosis Date   Alcohol  abuse    Anxiety and depression    Bilateral primary osteoarthritis of knee    Carpal tunnel syndrome of right wrist Dx 2015   Cervical stenosis of spine    MRI 10/2021   Complication of anesthesia    Essential hypertension    Gallbladder sludge    GERD (gastroesophageal reflux disease) Dx 2007   Hepatic steatosis    History of alcoholic hepatitis    History of CVA (cerebrovascular accident)    05/2021 MRI brain: Mild chronic microvascular ischemic change in the white matter. Small chronic infarct left superior cerebellum.  Also, chronic microhemorrhage, ? 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   Sarcoidosis of lung (HCC) Dx 1989   Seborrheic dermatitis    Stroke [facility])     Past Surgical History:  Procedure Laterality Date   carpel tunnel release Right 07/28/2014    done in [place]    ELBOW SURGERY     EMBOLIZATION Bilateral 10/09/2023   Procedure: bilateral internal iliac embolization;  Surgeon: Federico Hopkins, MD;  Location: [facility] OR;  Service: Radiology;  Laterality: Bilateral;   INSERTION OF TRACTION PIN Right 10/08/2023   Procedure: INSERTION OF TRACTION PIN RIGHT FEMUR;  Surgeon: Gaylon Kea, MD;  Location: [facility] OR;  Service: Orthopedics;  Laterality: Right;   IR ANGIOGRAM PELVIS SELECTIVE OR SUPRASELECTIVE  10/09/2023   IR ANGIOGRAM SELECTIVE EACH ADDITIONAL VESSEL  10/09/2023   IR ANGIOGRAM SELECTIVE EACH ADDITIONAL VESSEL  10/09/2023   IR HYBRID TRAUMA EMBOLIZATION   10/09/2023   IR US  GUIDE VASC ACCESS LEFT  10/09/2023   KNEE ARTHROSCOPY     KNEE SURGERY Left    15 years ago   LAPAROTOMY N/A 10/08/2023   Procedure: EXPLORATORY LAPAROTOMY, PELVIC PACKING, TEMPORARY ABDOMINAL CLOSURE; EXPLORATION OF PELVIS;  Surgeon: Junie Olds, MD;  Location: [facility] OR;  Service: General;  Laterality: N/A;   LAPAROTOMY N/A 10/10/2023   Procedure: EXPLORATORY LAPAROTOMY - OPEN ABDOMEN ABTHERA;  Surgeon: Dorena Gander, MD;  Location: [facility] OR;  Service: General;  Laterality: N/A;   LAPAROTOMY N/A 10/12/2023   Procedure: EXPLORATORY LAPAROTOMY WITH VAC CHANGE;  Surgeon: Dorena Gander, MD;  Location: [facility] OR;  Service: General;  Laterality: N/A;   LAPAROTOMY N/A 10/15/2023   Procedure: EXPLORATORY LAPAROTOMY;  Surgeon: Anda Bamberg, MD;  Location: [facility] OR;  Service: General;  Laterality: N/A;   LIPOMA EXCISION N/A 05/10/2016   Procedure: EXCISION OF SCALP LIPOMA;  Surgeon: Jacolyn Matar, MD;  Location: [facility];  Service: General;  Laterality: N/A;   OPEN REDUCTION INTERNAL FIXATION ACETABULUM FRACTURE POSTERIOR Right 10/17/2023   Procedure: OPEN REDUCTION INTERNAL  FIXATION  RIGHT ACETABULUM FRACTURE;  Surgeon: Laneta Pintos, MD;  Location: [facility] OR;  Service: Orthopedics;  Laterality: Right;   ORIF PELVIC FRACTURE Bilateral 10/17/2023   Procedure: OPEN REDUCTION INTERNAL FIXATION (ORIF) PELVIC FRACTURE;  Surgeon: Laneta Pintos, MD;  Location: [facility] OR;  Service: Orthopedics;  Laterality: Bilateral;   THORACIC AORTIC ENDOVASCULAR STENT GRAFT N/A 10/09/2023   Procedure: THORACIC AORTIC ENDOVASCULAR STENT GRAFT;  Surgeon: Kayla Part, MD;  Location: [facility] OR;  Service: Vascular;  Laterality: N/A;   WOUND EXPLORATION  10/08/2023   Procedure: EXPLORATION OF PELVIS;  Surgeon: Mallie Seal, MD;  Location: [facility] OR;  Service: Urology;;    Family Psychiatric History:  Patient reports that mental health runs in his extended family.  Family history of  suicide attempt: Patient reports that his first cousin committed suicide. Family history of homicide attempt: Patient denies Family history of substance abuse: Patient endorses a family history of substance abuse through alcohol  use.  Family History:  Family History  Problem Relation Age of Onset   Diabetes Father    Hypertension Father    Hypertension Paternal Grandfather    Alcohol  abuse Paternal Grandfather    Alcohol  abuse Maternal Grandfather    Alcohol  abuse Maternal Grandmother    Alcohol  abuse Paternal Grandmother    Colon cancer Neg Hx    Rectal cancer Neg Hx    Stomach cancer Neg Hx    Bipolar disorder Neg Hx    Depression Neg Hx     Social History:   Social History   Socioeconomic History   Marital status: Single    Spouse name: Not on file   Number of children: 2   Years of education: 13 yrs   Highest education level: Not on file  Occupational History   Occupation: custodian    Comment: [employer]  Tobacco Use   Smoking status: Never    Passive exposure: Never   Smokeless tobacco: Never  Vaping Use   Vaping status: Never Used  Substance and Sexual Activity   Alcohol  use: Yes    Comment: 2 beer last night 07-23-23   Drug use: Not Currently    Types: Marijuana    Comment: Patient denies    Sexual activity: Not Currently  Other Topics Concern   Not on file  Social History Narrative   ** Merged History Encounter **       Born and raised in [place], [place] by both parents. He has 2 sisters and is the middle child. He remains close with family. He was married for 16 yrs and seperated in 2005. He has 2 boys who are in their 20's now. He lives alone in [place]. He is working as a    Forensic psychologist handed  Lives in a one story home    Social Drivers of Corporate investment banker Strain: Not on File (07/22/2018)   Received from [employer], [employer]    Financial Resource Strain: 0  Food Insecurity: No Food Insecurity (02/15/2024)    Hunger Vital Sign    Worried About Running Out of Food in the Last Year: Never true    Ran Out of Food in the Last Year: Never true  Transportation Needs: No Transportation Needs (02/15/2024)   PRAPARE - Administrator, Civil Service (Medical): No    Lack of Transportation (Non-Medical): No  Physical Activity: Not on File (07/22/2018)   [facility] Activity    Physical Activity: 0  Stress: No Stress Concern Present (06/11/2023)   Received from [facility] Occup[facility] - Occupational Stress Questionnaire    Feeling of Stress : Not at all  Social Connections: Unknown (02/15/2024)   Social Connection and Isolation Panel [NHANES]    Frequency of Communication with Friends and Family: Twice a week    Frequency of Social Gatherings with Friends and Family: Once a week    Attends Religious Services: Not on Marketing executive or Organizations: Not on file    Attends Banker Meetings: Not on file    Marital Status: Not on file    Additional Social History:  Patient endorses social support.  Patient has children of his own.  Patient endorses housing.  Patient is currently unemployed.  Patient denies a past history of military experience.  Patient denies a past history of prison or jail time.  Highest education earned by the patient is 12th grade.  Patient denies access to weapons.  Allergies:   Allergies  Allergen Reactions   Oxycodone  Other (See Comments)    abd pain, stomach cramps     Metabolic Disorder Labs: Lab Results  Component Value Date   HGBA1C 5.7 (H) 01/21/2024   MPG 117 01/21/2024   MPG 126 11/12/2022   No results found for: "PROLACTIN" Lab Results  Component Value Date   CHOL 314 (H) 11/10/2022   TRIG 219 (H) 10/20/2023   HDL >135 11/10/2022   CHOLHDL NOT CALCULATED 11/10/2022   VLDL 22 11/10/2022   LDLCALC NOT CALCULATED 11/10/2022   LDLCALC 142 (H) 04/13/2022   Lab  Results  Component Value Date   TSH 1.160 05/10/2023    Therapeutic Level Labs: No results found for: "LITHIUM" No results found for: "CBMZ" No results found for: "VALPROATE"  Current Medications: Current Outpatient Medications  Medication Sig Dispense Refill   escitalopram  (LEXAPRO ) 5 MG tablet Patient to take lexapro  5 mg daily for 3 days before discontinuing. 3 tablet 0   sertraline (ZOLOFT) 50 MG tablet Take 1 tablet (50 mg total) by mouth daily. 30 tablet 1   acetaminophen  (TYLENOL ) 500 MG tablet Take 500-1,000 mg by mouth every 6 (six) hours as needed (pain.).     amLODipine  (NORVASC ) 5 MG tablet Take 1 tablet (5 mg total) by mouth daily. 90 tablet 1   Blood Pressure Monitor DEVI Please provide patient with insurance approved blood pressure monitor 1 each 0   buPROPion  (WELLBUTRIN  XL) 150 MG 24 hr tablet Take 1 tablet (150 mg total) by mouth daily. 30 tablet 1   cyclobenzaprine  (FLEXERIL ) 10 MG tablet Take 1 tablet (10 mg total) by mouth 3 (three) times daily as needed (muscle spasms, stiffness). 50 tablet 0   escitalopram  (LEXAPRO ) 20 MG tablet Take 1.5 tablets (30 mg total) by mouth daily. 90 tablet 1   gabapentin  (NEURONTIN ) 300 MG capsule Take 1 tablet in the morning, 1 tablet in the afternoon, and 2 at bedtime. 360 capsule 1   naltrexone  (DEPADE) 50 MG tablet Take 1 tablet (50 mg total) by mouth daily. 90 tablet 0   No current facility-administered medications for this visit.    Musculoskeletal: Strength & Muscle Tone: within normal limits Gait & Station: normal Patient leans: N/A  Psychiatric Specialty Exam: Review of Systems  Psychiatric/Behavioral:  Positive for sleep disturbance. Negative for decreased concentration, dysphoric mood, hallucinations, self-injury and suicidal ideas. The patient is  nervous/anxious. The patient is not hyperactive.     Blood pressure (!) 130/95, pulse 89, temperature 98.1 F (36.7 C), temperature source Oral, height 5\' 6"  (1.676 m),  weight 147 lb 6.4 oz (66.9 kg), SpO2 100%.Body mass index is 23.79 kg/m.  General Appearance: Casual  Eye Contact:  Good  Speech:  Clear and Coherent and Normal Rate  Volume:  Normal  Mood:  Anxious  Affect:  Appropriate  Thought Process:  Coherent, Goal Directed, and Descriptions of Associations: Intact  Orientation:  Full (Time, Place, and Person)  Thought Content:  WDL  Suicidal Thoughts:  No  Homicidal Thoughts:  No  Memory:  Immediate;   Good Recent;   Good Remote;   Fair  Judgement:  Good  Insight:  Good  Psychomotor Activity:  Normal  Concentration:  Concentration: Good and Attention Span: Good  Recall:  Good  Fund of Knowledge:Good  Language: Good  Akathisia:  No  Handed:  Right  AIMS (if indicated):  not done  Assets:  Communication Skills Desire for Improvement Housing Social Support  ADL's:  Intact  Cognition: WNL  Sleep:  Fair   Screenings: AIMS    Flowsheet Row [facility] ([facility]) [facility] Ad[facility] (Dis[facility]) [facility]  AIMS Total Score 0 0 0      AUDIT    Flowsheet Row Ad[facility] ([facility]) [facility] Ad[facility] (Discharged) from [facility] Ad[facility] (Discharged[facility] from [facility] Admission (Discharged) from 09[facility]06[facility]2014 [facility] Admission (Discharged) [facility]  Alcohol  Use Disorder Identification Test Final Score (AUDIT) 40 34 35 26 23      GAD-7    Flowsheet Row Office Visit from 04/15/2024 in [facility] Office Visit from 03/28/2024 in [facility] [place] - A Dept Of [place]. [facility] Office Visit from 02/15/2024 in [facility] - A Dept Of Tommas Fragmin. [facility] Office Visit from 05/10/2023 in [facility] [place] - A Dept Of Tom[facility] Frag[facility]. [facility] Office Visit from 02/21/2023 in [facility] [place] - A Dept Of [place]mas Fragmin. [facility]  Total GAD-7 Score 11 10 11 14 11       PHQ2-9    Flowsheet Row Office Visit from 04/15/2024 in [facility] Office Visit from 03/28/2024 in [facility] [place] - A Dept Of [place]. [facility] Office Visit from 02/15/2024 in [facility] [place] - A Dept Of [facility]. [facility] Office Visit from 05/10/2023 in [facility] [place] - A Dept Of Tommas Fragmin. [facility] Office Visit from 02/21/2023 in [facility] [place] - A Dept Of Tom[facility] Fragmin. [facility]  PHQ-2 Total Score 1 2 3 6 3   PHQ-9 Total Score -- 7 7 20 12       Flowsheet Row Office Visit from 04/15/2024 in [facility] ED from 01/08/2024 in [facility] Department at [facility] ED from 09/04/2023 in [facility] Emergency Department at [facility]  C-SSRS RISK CATEGORY No Risk No Risk No Risk       Assessment  and Plan:   Estil Vallee is a 63 year old male with a past psychiatric history significant for anxiety who presents to [facility] to establish psychiatric care for medication management.  Patient presents today encounter stating that he has been struggling with anxiety.  He reports that he was diagnosed with depression a while back but does not believe that he is depressed.  Patient is currently being managed on the following psychiatric medications:  Escitalopram  30 mg daily Gabapentin  300 mg 3 times daily Bupropion  (Wellbutrin  XL) 150 mg 24-hour tablet daily  Patient reports that he has been taking escitalopram   for 10 years and believes that the medication has lost its effectiveness.  Patient also has been on BuSpar  in the past and states that the medication was minimally effective in managing his anxiety.  Patient continues to endorse anxiety accompanied by muscle tension in his shoulders and neck area.  Due to his anxiety, patient reports that he has resorted to alcohol  use for the management of his anxiety.  He denies experiencing panic attacks and states that it has been sometime since he has experienced one.  A GAD-7 screen was performed with the patient scoring an 11.  Due to patient's escitalopram  being an effective in managing his anxiety, provider recommended patient discontinue his use of Lexapro .  Patient was instructed to take Lexapro  10 mg for 3 days, followed by 5 mg for 3 more days before discontinuing the medication.  Patient vocalized understanding.  Provider recommended patient be placed on sertraline 50 mg daily for the management of his anxiety.  Patient was instructed to take sertraline once he has discontinued his use of Lexapro .  While reviewing patient's  MAR, provider informed patient that he was being prescribed naltrexone  50 mg daily.  Patient is open to starting naltrexone  for the management of his alcohol  use.  Patient's medications to be e-prescribed to pharmacy of choice.  Patient denies suicidal ideations and was able to contract for safety following the conclusion of the encounter.  Collaboration of Care: Medication Management AEB provider managing patient's psychiatric medications, Primary Care Provider AEB patient being seen by internal medicine, Psychiatrist AEB patient being followed by mental health provider at this facility, and Other provider involved in patient's care AEB patient being seen by neurology and rehabilitation.  Patient/Guardian was advised Release of Information must be obtained prior to any record release in order to collaborate their care with an outside  provider. Patient/Guardian was advised if they have not already done so to contact the registration department to sign all necessary forms in order for us  to release information regarding their care.   Consent: Patient/Guardian gives verbal consent for treatment and assignment of benefits for services provided during this visit. Patient/Guardian expressed understanding and agreed to proceed.   1. Generalized anxiety disorder (Primary)  - escitalopram  (LEXAPRO ) 5 MG tablet; Patient to take lexapro  5 mg daily for 3 days before discontinuing.  Dispense: 3 tablet; Refill: 0 - sertraline (ZOLOFT) 50 MG tablet; Take 1 tablet (50 mg total) by mouth daily.  Dispense: 30 tablet; Refill: 1  2. Alcohol  abuse  - naltrexone  (DEPADE) 50 MG tablet; Take 1 tablet (50 mg total) by mouth daily.  Dispense: 90 tablet; Refill: 0  Patient to follow up in 6 weeks Provider spent a total of 52 minutes with the patient/reviewing patient's chart  Gates Kasal, PA 5/20/20255:49 PM

## 2024-04-16 ENCOUNTER — Telehealth: Payer: Self-pay | Admitting: Nurse Practitioner

## 2024-04-16 ENCOUNTER — Ambulatory Visit: Payer: Self-pay | Admitting: Nurse Practitioner

## 2024-04-16 ENCOUNTER — Other Ambulatory Visit: Payer: Self-pay

## 2024-04-16 NOTE — Telephone Encounter (Signed)
 Called & spoke to the patient. Verified name & DOB. Patient appointment scheduled for 05/01/2024 at 9:50 am. Patient acknowledged.   Copied from CRM [id number]. Topic: Appointments - Appointment Cancel/Reschedule >> Apr 16, 2024  7:36 AM Oddis Bench wrote: Patient/patient representative is calling to cancel or reschedule an appointment. Refer to attachments for appointment information. The patient is stating that he is not feeling good.

## 2024-04-18 ENCOUNTER — Other Ambulatory Visit: Payer: Self-pay

## 2024-04-18 MED ORDER — PREDNISOLONE ACETATE 1 % OP SUSP
1.0000 [drp] | Freq: Four times a day (QID) | OPHTHALMIC | 1 refills | Status: DC
  Filled 2024-04-18: qty 5, 25d supply, fill #0

## 2024-04-18 MED ORDER — KETOROLAC TROMETHAMINE 0.5 % OP SOLN
1.0000 [drp] | Freq: Four times a day (QID) | OPHTHALMIC | 1 refills | Status: DC
  Filled 2024-04-18: qty 5, 25d supply, fill #0

## 2024-04-18 MED ORDER — GATIFLOXACIN 0.5 % OP SOLN
1.0000 [drp] | Freq: Four times a day (QID) | OPHTHALMIC | 1 refills | Status: DC
  Filled 2024-04-18: qty 5, 25d supply, fill #0

## 2024-04-23 ENCOUNTER — Other Ambulatory Visit: Payer: Self-pay

## 2024-04-24 NOTE — Therapy (Incomplete)
 OUTPATIENT PHYSICAL THERAPY CERVICAL EVALUATION   Patient Name: [name] [name] MRN: [id number] DOB:24-May-1961, 63 y.o., male Today's Date: 04/24/2024  END OF SESSION:   Past Medical History:  Diagnosis Date   Alcohol  abuse    Anxiety and depression    Bilateral primary osteoarthritis of knee    Carpal tunnel syndrome of right wrist Dx 2015   Cervical stenosis of spine    MRI 10/2021   Complication of anesthesia    Essential hypertension    Gallbladder sludge    GERD (gastroesophageal reflux disease) Dx 2007   Hepatic steatosis    History of alcoholic hepatitis    History of CVA (cerebrovascular accident)    05/2021 MRI brain: Mild chronic microvascular ischemic change in the white matter. Small chronic infarct left superior cerebellum.  Also, chronic microhemorrhage, ? 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   Sarcoidosis of lung (HCC) Dx 1989   Seborrheic dermatitis    Stroke [facility])    Past Surgical History:  Procedure Laterality Date   carpel tunnel release Right 07/28/2014    done in [place]    ELBOW SURGERY     EMBOLIZATION Bilateral 10/09/2023   Procedure: bilateral internal iliac embolization;  Surgeon: Federico Hopkins, MD;  Location: [facility] OR;  Service: Radiology;  Laterality: Bilateral;   INSERTION OF TRACTION PIN Right 10/08/2023   Procedure: INSERTION OF TRACTION PIN RIGHT FEMUR;  Surgeon: Gaylon Kea, MD;  Location: [facility] OR;  Service: Orthopedics;  Laterality: Right;   IR ANGIOGRAM PELVIS SELECTIVE OR SUPRASELECTIVE  10/09/2023   IR ANGIOGRAM SELECTIVE EACH ADDITIONAL VESSEL  10/09/2023   IR ANGIOGRAM SELECTIVE EACH ADDITIONAL VESSEL  10/09/2023   IR HYBRID TRAUMA EMBOLIZATION  10/09/2023   IR US  GUIDE VASC ACCESS LEFT  10/09/2023    KNEE ARTHROSCOPY     KNEE SURGERY Left    15 years ago   LAPAROTOMY N/A 10/08/2023   Procedure: EXPLORATORY LAPAROTOMY, PELVIC PACKING, TEMPORARY ABDOMINAL CLOSURE; EXPLORATION OF PELVIS;  Surgeon: Junie Olds, MD;  Location: [facility] OR;  Service: General;  Laterality: N/A;   LAPAROTOMY N/A 10/10/2023   Procedure: EXPLORATORY LAPAROTOMY - OPEN ABDOMEN ABTHERA;  Surgeon: Dorena Gander, MD;  Location: [facility] OR;  Service: General;  Laterality: N/A;   LAPAROTOMY N/A 10/12/2023   Procedure: EXPLORATORY LAPAROTOMY WITH VAC CHANGE;  Surgeon: Dorena Gander, MD;  Location: [facility] OR;  Service: General;  Laterality: N/A;   LAPAROTOMY N/A 10/15/2023   Procedure: EXPLORATORY LAPAROTOMY;  Surgeon: Anda Bamberg, MD;  Location: [facility] OR;  Service: General;  Laterality: N/A;   LIPOMA EXCISION N/A 05/10/2016   Procedure: EXCISION OF SCALP LIPOMA;  Surgeon: Jacolyn Matar, MD;  Location: [facility];  Service: General;  Laterality: N/A;   OPEN REDUCTION INTERNAL FIXATION ACETABULUM FRACTURE POSTERIOR Right 10/17/2023   Procedure: OPEN REDUCTION INTERNAL FIXATION  RIGHT ACETABULUM FRACTURE;  Surgeon: Laneta Pintos, MD;  Location: [facility] OR;  Service: Orthopedics;  Laterality: Right;   ORIF PELVIC FRACTURE Bilateral 10/17/2023   Procedure: OPEN REDUCTION INTERNAL FIXATION (ORIF) PELVIC FRACTURE;  Surgeon: Laneta Pintos, MD;  Location: [facility] OR;  Service: Orthopedics;  Laterality: Bilateral;   THORACIC AORTIC ENDOVASCULAR STENT GRAFT N/A 10/09/2023  Procedure: THORACIC AORTIC ENDOVASCULAR STENT GRAFT;  Surgeon: Kayla Part, MD;  Location: [facility] OR;  Service: Vascular;  Laterality: N/A;   WOUND EXPLORATION  10/08/2023   Procedure: EXPLORATION OF PELVIS;  Surgeon: Mallie Seal, MD;  Location: [facility] OR;  Service: Urology;;   Patient Active Problem List   Diagnosis Date Noted   Malnutrition of moderate degree 11/04/2023   Acute stress disorder 11/04/2023   Status post surgery 10/08/2023   Alcohol   use disorder, severe, dependence (HCC) 11/11/2022   Alcohol  use disorder, severe, in early remission (HCC) 11/10/2022   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   Alcohol  abuse with intoxication (HCC) 03/27/2022   Neuropathy 12/30/2021   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) 12/27/2021   Uncomplicated alcohol  dependence (HCC) 06/16/2021   Essential hypertension 04/07/2021   Prediabetes 04/07/2021   Other hyperlipidemia 04/07/2021   Major depressive disorder, severe (HCC) 03/16/2021   History of hyperlipidemia 12/21/2020   Seborrheic dermatitis of scalp 12/21/2020   Substance abuse (HCC) 12/21/2020   Hyperopia of both eyes with astigmatism and presbyopia 12/20/2020   Posterior vitreous detachment of right eye 12/20/2020   MDD (major depressive disorder), recurrent episode, severe (HCC) 06/30/2017   Lateral epicondylitis of right elbow 01/05/2017   Strain, MCP, hand, right, initial encounter 01/05/2017   Vasomotor rhinitis 02/13/2016   Bilateral chronic knee pain 01/11/2016   Chronic pain of right wrist 07/07/2015   Carpal tunnel syndrome of right wrist 05/20/2015   Dislocation of right shoulder joint 05/20/2015   Tear of medial meniscus of right knee 03/24/2015   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  Rationale for Evaluation and Treatment: Rehabilitation  ONSET DATE: ***  SUBJECTIVE:                                                                                                                                                                                                          SUBJECTIVE STATEMENT: *** Hand dominance: {MISC; OT HAND [phone]}  PERTINENT HISTORY:  ETOH w/ hepatitis, anxiety/depression, HTN, GERD, hx CVA,  neuropathy, recurrent shoulder dislocations, pulmonary sarcoidosis  PAIN:  Are you having pain: *** Location/description: *** Best-worst over past week: ***  - aggravating factors: *** - Easing factors: ***    PRECAUTIONS: None  RED FLAGS: {PT Red Flags:29287}     WEIGHT BEARING RESTRICTIONS: No  FALLS:  Has patient fallen in last 6 months? {fallsyesno:27318}  LIVING ENVIRONMENT: Lives with: {[facility] lives with:25569::"lives with their family"} Lives in: {Lives in:25570} Stairs: {opstairs:27293} Has following equipment at home: {Assistive devices:23999}  OCCUPATION: ***  PLOF: {PLOF:24004}  PATIENT GOALS: ***  NEXT MD VISIT: ***  OBJECTIVE:  Note: Objective measures were completed at Evaluation unless otherwise noted.  DIAGNOSTIC FINDINGS:  ***  PATIENT SURVEYS:  NDI: ***   COGNITION: Overall cognitive status: {cognition:24006}  SENSATION: {sensation:27233}  POSTURE: {posture:25561}  PALPATION: ***   CERVICAL ROM:   {AROM/PROM:27142} ROM A/PROM (deg) eval  Flexion   Extension   Right lateral flexion   Left lateral flexion   Right rotation   Left rotation    (Blank rows = not tested) (Key: WFL = within functional limits not formally assessed, * = concordant pain, s = stiffness/stretching sensation, NT = not tested) Comments:   UPPER EXTREMITY ROM:  A/PROM Right eval Left eval  Shoulder flexion    Shoulder abduction    Shoulder internal rotation    Shoulder external rotation    Elbow flexion    Elbow extension    Wrist flexion    Wrist extension     (Blank rows = not tested) (Key: WFL = within functional  limits not formally assessed, * = concordant pain, s = stiffness/stretching sensation, NT = not tested)  Comments:    UPPER EXTREMITY MMT:  MMT Right eval Left eval  Shoulder flexion    Shoulder extension    Shoulder abduction    Shoulder extension    Shoulder internal rotation    Shoulder external rotation    Elbow flexion    Elbow extension    Grip strength    (Blank rows = not tested)  (Key: WFL = within functional limits not formally assessed, * = concordant pain, s = stiffness/stretching sensation, NT = not tested)  Comments:   CERVICAL SPECIAL TESTS:  {Cervical special tests:25246}  FUNCTIONAL TESTS:  {Functional tests:24029}  TREATMENT DATE:  [facility] Adult PT Treatment:                                                DATE: 04/25/24 Therapeutic Exercise: *** Manual Therapy: *** Neuromuscular re-ed: *** Therapeutic Activity: *** Modalities: *** Self Care: ***                                                                                                                                  PATIENT EDUCATION:  Education details: rationale  for interventions, HEP  Person educated: Patient Education method: Explanation, Demonstration, Tactile cues, Verbal cues Education comprehension: verbalized understanding, returned demonstration, verbal cues required, tactile cues required, and needs further education     HOME EXERCISE PROGRAM: ***  ASSESSMENT:  CLINICAL IMPRESSION: Patient is a 63 y.o. gentleman who was seen today for physical therapy evaluation and treatment for neck pain. ***    OBJECTIVE IMPAIRMENTS: {opptimpairments:25111}.   ACTIVITY LIMITATIONS: {activitylimitations:27494}  PARTICIPATION LIMITATIONS: {participationrestrictions:25113}  PERSONAL FACTORS: Age, Time since onset of injury/illness/exacerbation, and 3+ comorbidities: ETOH w/ hepatitis, anxiety/depression, HTN, GERD, hx CVA, neuropathy, recurrent shoulder dislocations, pulmonary sarcoidosis  are also affecting patient's functional outcome.   REHAB POTENTIAL: {rehabpotential:25112}  CLINICAL DECISION MAKING: {clinical decision making:25114}  EVALUATION COMPLEXITY: {Evaluation complexity:25115}   GOALS: Goals reviewed with patient? {yes/no:20286}  SHORT TERM GOALS: Target date: ***  Pt will demonstrate appropriate understanding and performance of initially prescribed HEP in order to facilitate improved independence with management of symptoms.  Baseline: HEP ***  Goal status: INITIAL   2. Pt will report at least 25% improvement in overall pain levels over past week in order to facilitate improved tolerance to typical daily activities.   Baseline: ***  Goal status: INITIAL    LONG TERM GOALS: Target date: ***  Pt will score less than or equal to *** on NDI in order to demonstrate improved perception of function due to symptoms (MDC 10-13 pts per Adline Hook al 2009, 2010). Baseline: *** Goal status: INITIAL  2. Pt will demonstrate at least *** degrees of active cervical rotation ROM in order to demonstrate improved environmental awareness and safety with driving.  Baseline: see ROM chart above Goal status: INITIAL  3. Pt will demonstrate at least 4+/5 shoulder MMT for improved symmetry of UE strength and improved tolerance to functional movements.  Baseline: see MMT chart above Goal status: INITIAL   4. Pt will report/demonstrate ability to *** with less than 2 point increase on NPS in order to demonstrate improved tolerance to functional activities such as ***. Baseline: *** Goal status: INITIAL    PLAN:  PT FREQUENCY: {rehab frequency:25116}  PT DURATION: {rehab duration:25117}  PLANNED INTERVENTIONS: {rehab planned interventions:25118::"97110-Therapeutic exercises","97530- Therapeutic [phone]- Neuromuscular re-education","97535- Self JXBJ","47829- Manual therapy"}  PLAN FOR NEXT SESSION: Review/update HEP PRN. Work on [employer] exercises as  appropriate with emphasis on ***. Symptom modification strategies as indicated/appropriate.    Lovett Ruck PT, DPT 04/24/2024 3:15 PM

## 2024-04-25 ENCOUNTER — Ambulatory Visit: Payer: MEDICAID | Attending: Orthopedic Surgery | Admitting: Physical Therapy

## 2024-04-28 ENCOUNTER — Ambulatory Visit (INDEPENDENT_AMBULATORY_CARE_PROVIDER_SITE_OTHER): Payer: MEDICAID | Admitting: Primary Care

## 2024-04-28 ENCOUNTER — Other Ambulatory Visit: Payer: Self-pay

## 2024-04-30 ENCOUNTER — Other Ambulatory Visit: Payer: Self-pay

## 2024-05-01 ENCOUNTER — Other Ambulatory Visit: Payer: Self-pay

## 2024-05-01 ENCOUNTER — Ambulatory Visit (INDEPENDENT_AMBULATORY_CARE_PROVIDER_SITE_OTHER): Payer: MEDICAID | Admitting: Primary Care

## 2024-05-08 ENCOUNTER — Other Ambulatory Visit: Payer: Self-pay

## 2024-05-09 ENCOUNTER — Telehealth (INDEPENDENT_AMBULATORY_CARE_PROVIDER_SITE_OTHER): Payer: Self-pay | Admitting: Primary Care

## 2024-05-09 NOTE — Telephone Encounter (Signed)
 Called pt to confirm appt. LVM for pt

## 2024-05-12 ENCOUNTER — Ambulatory Visit (INDEPENDENT_AMBULATORY_CARE_PROVIDER_SITE_OTHER): Payer: MEDICAID | Admitting: Primary Care

## 2024-05-12 ENCOUNTER — Telehealth (INDEPENDENT_AMBULATORY_CARE_PROVIDER_SITE_OTHER): Payer: Self-pay | Admitting: Primary Care

## 2024-05-12 ENCOUNTER — Encounter (INDEPENDENT_AMBULATORY_CARE_PROVIDER_SITE_OTHER): Payer: Self-pay | Admitting: Primary Care

## 2024-05-12 ENCOUNTER — Ambulatory Visit: Payer: MEDICAID | Admitting: Neurology

## 2024-05-12 VITALS — BP 131/89 | HR 89 | Resp 16 | Wt 150.2 lb

## 2024-05-12 DIAGNOSIS — L219 Seborrheic dermatitis, unspecified: Secondary | ICD-10-CM | POA: Diagnosis not present

## 2024-05-12 DIAGNOSIS — G8929 Other chronic pain: Secondary | ICD-10-CM | POA: Diagnosis not present

## 2024-05-12 DIAGNOSIS — L309 Dermatitis, unspecified: Secondary | ICD-10-CM

## 2024-05-12 DIAGNOSIS — M545 Low back pain, unspecified: Secondary | ICD-10-CM
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
## 2024-05-12 NOTE — Telephone Encounter (Signed)
 Called pt to see if they could come in sooner than scheduled appt. Please call office if pt does return call.

## 2024-05-15 ENCOUNTER — Ambulatory Visit (INDEPENDENT_AMBULATORY_CARE_PROVIDER_SITE_OTHER): Payer: MEDICAID | Admitting: Primary Care

## 2024-05-16 ENCOUNTER — Other Ambulatory Visit: Payer: Self-pay

## 2024-05-19 ENCOUNTER — Other Ambulatory Visit: Payer: Self-pay

## 2024-05-19 ENCOUNTER — Ambulatory Visit: Payer: MEDICAID | Admitting: Neurology

## 2024-05-20 ENCOUNTER — Encounter: Payer: Self-pay | Admitting: Neurology

## 2024-05-20 ENCOUNTER — Ambulatory Visit: Payer: MEDICAID | Admitting: Neurology

## 2024-05-27 ENCOUNTER — Ambulatory Visit: Payer: MEDICAID | Admitting: Neurology

## 2024-05-28 ENCOUNTER — Encounter (HOSPITAL_COMMUNITY): Payer: MEDICAID | Admitting: Physician Assistant

## 2024-05-29 ENCOUNTER — Ambulatory Visit: Payer: MEDICAID | Admitting: Orthopedic Surgery

## 2024-06-10 ENCOUNTER — Encounter: Payer: Self-pay | Admitting: Neurology

## 2024-06-10 ENCOUNTER — Ambulatory Visit: Payer: MEDICAID | Admitting: Neurology

## 2024-06-16 ENCOUNTER — Ambulatory Visit: Payer: MEDICAID | Admitting: Orthopedic Surgery

## 2024-06-16 ENCOUNTER — Other Ambulatory Visit: Payer: Self-pay

## 2024-06-16 DIAGNOSIS — S32421A Displaced fracture of posterior wall of right acetabulum, initial encounter for closed fracture: Secondary | ICD-10-CM | POA: Diagnosis not present

## 2024-06-16 DIAGNOSIS — M5441 Lumbago with sciatica, right side: Secondary | ICD-10-CM | POA: Diagnosis not present

## 2024-06-16 DIAGNOSIS — M25551 Pain in right hip: Secondary | ICD-10-CM

## 2024-06-16 DIAGNOSIS — G8929 Other chronic pain: Secondary | ICD-10-CM

## 2024-06-17 ENCOUNTER — Encounter: Payer: Self-pay | Admitting: Orthopedic Surgery
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
## 2024-06-23 ENCOUNTER — Other Ambulatory Visit: Payer: Self-pay

## 2024-06-23 ENCOUNTER — Ambulatory Visit (INDEPENDENT_AMBULATORY_CARE_PROVIDER_SITE_OTHER): Payer: MEDICAID | Admitting: Orthopaedic Surgery

## 2024-06-23 DIAGNOSIS — G8929 Other chronic pain: Secondary | ICD-10-CM

## 2024-06-23 DIAGNOSIS — M25551 Pain in right hip: Secondary | ICD-10-CM

## 2024-06-23 DIAGNOSIS — M5441 Lumbago with sciatica, right side: Secondary | ICD-10-CM
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
## 2024-06-27 ENCOUNTER — Encounter: Payer: Self-pay | Admitting: Orthopaedic Surgery

## 2024-07-02 ENCOUNTER — Inpatient Hospital Stay: Admission: RE | Admit: 2024-07-02 | Payer: MEDICAID | Source: Ambulatory Visit

## 2024-07-04 ENCOUNTER — Encounter: Payer: Self-pay | Admitting: Orthopaedic Surgery

## 2024-07-09 ENCOUNTER — Other Ambulatory Visit: Payer: MEDICAID

## 2024-07-09 ENCOUNTER — Other Ambulatory Visit: Payer: Self-pay

## 2024-07-11 ENCOUNTER — Other Ambulatory Visit: Payer: MEDICAID

## 2024-07-15 ENCOUNTER — Other Ambulatory Visit: Payer: Self-pay | Admitting: Nurse Practitioner

## 2024-07-15 ENCOUNTER — Other Ambulatory Visit (HOSPITAL_COMMUNITY): Payer: Self-pay | Admitting: Physician Assistant

## 2024-07-15 ENCOUNTER — Other Ambulatory Visit: Payer: Self-pay

## 2024-07-15 DIAGNOSIS — F1094 Alcohol use, unspecified with alcohol-induced mood disorder: Secondary | ICD-10-CM

## 2024-07-15 DIAGNOSIS — F411 Generalized anxiety disorder: Secondary | ICD-10-CM

## 2024-07-15 DIAGNOSIS — F419 Anxiety disorder, unspecified: Secondary | ICD-10-CM

## 2024-07-15 MED ORDER — CHLORDIAZEPOXIDE HCL 5 MG PO CAPS
ORAL_CAPSULE | ORAL | 0 refills | Status: AC
  Filled 2024-07-15: qty 21, 5d supply, fill #0

## 2024-07-16 ENCOUNTER — Other Ambulatory Visit: Payer: MEDICAID

## 2024-07-16 ENCOUNTER — Other Ambulatory Visit: Payer: Self-pay

## 2024-07-16 MED ORDER — ESCITALOPRAM OXALATE 20 MG PO TABS
30.0000 mg | ORAL_TABLET | Freq: Every day | ORAL | 1 refills | Status: DC
  Filled 2024-07-16: qty 90, 60d supply, fill #0

## 2024-07-16 MED ORDER — BUPROPION HCL ER (XL) 150 MG PO TB24
150.0000 mg | ORAL_TABLET | Freq: Every day | ORAL | 1 refills | Status: DC
  Filled 2024-07-16: qty 30, 30d supply, fill #0

## 2024-07-24 ENCOUNTER — Other Ambulatory Visit: Payer: Self-pay

## 2024-07-24 MED ORDER — NALTREXONE HCL 50 MG PO TABS
50.0000 mg | ORAL_TABLET | Freq: Every evening | ORAL | 0 refills | Status: DC
  Filled 2024-07-24: qty 30, 30d supply, fill #0

## 2024-07-24 MED ORDER — NALTREXONE HCL 50 MG PO TABS
50.0000 mg | ORAL_TABLET | Freq: Every day | ORAL | 0 refills | Status: DC
  Filled 2024-07-24: qty 30, 30d supply, fill #0

## 2024-07-25 ENCOUNTER — Other Ambulatory Visit: Payer: Self-pay

## 2024-07-25 ENCOUNTER — Telehealth: Payer: Self-pay | Admitting: Nurse Practitioner

## 2024-07-25 NOTE — Telephone Encounter (Signed)
 Pt confirmed appt 8/29

## 2024-07-29 ENCOUNTER — Ambulatory Visit: Payer: Self-pay | Admitting: Nurse Practitioner

## 2024-08-04 ENCOUNTER — Ambulatory Visit: Payer: MEDICAID | Admitting: Orthopaedic Surgery

## 2024-08-07 ENCOUNTER — Encounter: Payer: Self-pay | Admitting: Orthopaedic Surgery

## 2024-08-07 ENCOUNTER — Ambulatory Visit: Payer: MEDICAID | Admitting: Orthopaedic Surgery

## 2024-08-07 DIAGNOSIS — M1611 Unilateral primary osteoarthritis, right hip: Secondary | ICD-10-CM

## 2024-08-07 DIAGNOSIS — M25551 Pain in right hip: Secondary | ICD-10-CM
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
## 2024-08-08 ENCOUNTER — Other Ambulatory Visit: Payer: Self-pay

## 2024-08-08 DIAGNOSIS — M1611 Unilateral primary osteoarthritis, right hip: Secondary | ICD-10-CM

## 2024-08-08 DIAGNOSIS — S32421A Displaced fracture of posterior wall of right acetabulum, initial encounter for closed fracture: Secondary | ICD-10-CM

## 2024-08-08 DIAGNOSIS — G8929 Other chronic pain: Secondary | ICD-10-CM

## 2024-08-08 DIAGNOSIS — M25551 Pain in right hip: Secondary | ICD-10-CM

## 2024-08-14 ENCOUNTER — Encounter: Payer: Self-pay | Admitting: Orthopaedic Surgery

## 2024-08-15 ENCOUNTER — Encounter: Payer: Self-pay | Admitting: Orthopaedic Surgery

## 2024-08-19 ENCOUNTER — Other Ambulatory Visit: Payer: MEDICAID

## 2024-08-19 ENCOUNTER — Ambulatory Visit
Admission: RE | Admit: 2024-08-19 | Discharge: 2024-08-19 | Disposition: A | Discharge status: Home / Self Care | Payer: MEDICAID | Source: Ambulatory Visit | Attending: Orthopaedic Surgery | Admitting: Orthopaedic Surgery

## 2024-08-19 DIAGNOSIS — G8929 Other chronic pain: Secondary | ICD-10-CM

## 2024-08-19 DIAGNOSIS — M25551 Pain in right hip: Secondary | ICD-10-CM

## 2024-08-19 DIAGNOSIS — M1611 Unilateral primary osteoarthritis, right hip: Secondary | ICD-10-CM

## 2024-08-19 DIAGNOSIS — S32421A Displaced fracture of posterior wall of right acetabulum, initial encounter for closed fracture: Secondary | ICD-10-CM

## 2024-08-21 ENCOUNTER — Other Ambulatory Visit: Payer: MEDICAID

## 2024-09-24 ENCOUNTER — Ambulatory Visit: Payer: MEDICAID | Admitting: Orthopaedic Surgery

## 2024-09-29 ENCOUNTER — Encounter: Payer: Self-pay | Admitting: Radiology

## 2024-09-29 ENCOUNTER — Ambulatory Visit: Payer: MEDICAID | Admitting: Orthopaedic Surgery

## 2024-10-20 ENCOUNTER — Ambulatory Visit: Payer: MEDICAID | Admitting: Orthopaedic Surgery

## 2024-11-05 ENCOUNTER — Ambulatory Visit: Payer: MEDICAID | Admitting: Orthopaedic Surgery

## 2024-12-09 ENCOUNTER — Ambulatory Visit (HOSPITAL_COMMUNITY)
Admission: EM | Admit: 2024-12-09 | Discharge: 2024-12-10 | Disposition: A | Discharge status: Other Acute Inpatient Hospital | Payer: MEDICAID | Attending: Psychiatry | Admitting: Psychiatry

## 2024-12-09 DIAGNOSIS — F411 Generalized anxiety disorder: Secondary | ICD-10-CM | POA: Insufficient documentation

## 2024-12-09 DIAGNOSIS — F329 Major depressive disorder, single episode, unspecified: Secondary | ICD-10-CM | POA: Insufficient documentation

## 2024-12-09 DIAGNOSIS — F1024 Alcohol dependence with alcohol-induced mood disorder: Secondary | ICD-10-CM | POA: Insufficient documentation

## 2024-12-09 DIAGNOSIS — R945 Abnormal results of liver function studies: Secondary | ICD-10-CM | POA: Insufficient documentation

## 2024-12-09 NOTE — Progress Notes (Signed)
" °   12/09/24 2252  [facility] Triage Screening (Walk-ins at [facility] only)  What Is the Reason for Your Visit/Call Today? pt presents to [facility] voluntary seeking alcohol  detox. Pt expresses that he has been drinking alcohol  on and off for 30 yrs. Pt confirms that he drank about 2 quarts of malt liquor today before arriving. Pt states I am in my 60s and need to get help. I have troube with anxiety and used alcohol  to self medicate. pt is urgent.  How Long Has This Been Causing You Problems? > than 6 months  Have You Recently Had Any Thoughts About Hurting Yourself? No  Are You Planning to Commit Suicide/Harm Yourself At This time? No  Have you Recently Had Thoughts About Hurting Someone Sherral? No  Are You Planning To Harm Someone At This Time? No  Physical Abuse Denies  Verbal Abuse Denies  Sexual Abuse Denies  Are you currently experiencing any auditory, visual or other hallucinations? No  Have You Used Any Alcohol  or Drugs in the Past 24 Hours? Yes  What Did You Use and How Much? malt liquor about 2 quarts  Do you have any current medical co-morbidities that require immediate attention? Yes  Please describe current medical co-morbidities that require immediate attention: pt expressed that he has an appt to get a hip replacement soon  Clinician description of patient physical appearance/behavior: calm, cooperative and eager to get help  What Do You Feel Would Help You the Most Today? Alcohol  or Drug Use Treatment  If access to [facility] was not available, would you have sought care in the Emergency Department? Yes  Determination of Need Urgent (48 hours)  Options For Referral Chemical Dependency Intensive Outpatient Therapy (CDIOP);Facility-Based Crisis;Other: Comment  Determination of Need filed? Yes    "

## 2024-12-10 ENCOUNTER — Encounter (HOSPITAL_COMMUNITY): Payer: Self-pay | Admitting: Nurse Practitioner

## 2024-12-10 ENCOUNTER — Other Ambulatory Visit (HOSPITAL_COMMUNITY)
Admission: EM | Admit: 2024-12-10 | Discharge: 2024-12-15 | Disposition: A | Discharge status: Home / Self Care | Payer: MEDICAID | Source: Intra-hospital | Attending: Psychiatry | Admitting: Psychiatry

## 2024-12-10 ENCOUNTER — Other Ambulatory Visit: Payer: Self-pay

## 2024-12-10 DIAGNOSIS — F411 Generalized anxiety disorder: Secondary | ICD-10-CM | POA: Diagnosis not present

## 2024-12-10 DIAGNOSIS — R748 Abnormal levels of other serum enzymes: Secondary | ICD-10-CM | POA: Diagnosis not present

## 2024-12-10 DIAGNOSIS — F1024 Alcohol dependence with alcohol-induced mood disorder: Secondary | ICD-10-CM | POA: Diagnosis not present

## 2024-12-10 DIAGNOSIS — Z79899 Other long term (current) drug therapy: Secondary | ICD-10-CM | POA: Insufficient documentation

## 2024-12-10 DIAGNOSIS — F329 Major depressive disorder, single episode, unspecified: Secondary | ICD-10-CM | POA: Diagnosis not present

## 2024-12-10 DIAGNOSIS — F1020 Alcohol dependence, uncomplicated: Secondary | ICD-10-CM | POA: Diagnosis present

## 2024-12-10 DIAGNOSIS — R945 Abnormal results of liver function studies: Secondary | ICD-10-CM | POA: Diagnosis not present

## 2024-12-10 DIAGNOSIS — F1014 Alcohol abuse with alcohol-induced mood disorder: Secondary | ICD-10-CM

## 2024-12-10 LAB — COMPREHENSIVE METABOLIC PANEL WITH GFR
ALT: 149 U/L — ABNORMAL HIGH (ref 0–44)
AST: 192 U/L — ABNORMAL HIGH (ref 15–41)
Albumin: 4.7 g/dL (ref 3.5–5.0)
Alkaline Phosphatase: 53 U/L (ref 38–126)
Anion gap: 15 (ref 5–15)
BUN: 6 mg/dL — ABNORMAL LOW (ref 8–23)
CO2: 27 mmol/L (ref 22–32)
Calcium: 9.5 mg/dL (ref 8.9–10.3)
Chloride: 93 mmol/L — ABNORMAL LOW (ref 98–111)
Creatinine, Ser: 0.96 mg/dL (ref 0.61–1.24)
GFR, Estimated: >60 mL/min
Glucose, Bld: 84 mg/dL (ref 70–99)
Potassium: 4 mmol/L (ref 3.5–5.1)
Sodium: 134 mmol/L — ABNORMAL LOW (ref 135–145)
Total Bilirubin: 1.3 mg/dL — ABNORMAL HIGH (ref 0.0–1.2)
Total Protein: 8 g/dL (ref 6.5–8.1)

## 2024-12-10 LAB — CBC WITH DIFFERENTIAL/PLATELET
Abs Immature Granulocytes: 0.02 K/uL (ref 0.00–0.07)
Basophils Absolute: 0 K/uL (ref 0.0–0.1)
Basophils Relative: 1 %
Eosinophils Absolute: 0 K/uL (ref 0.0–0.5)
Eosinophils Relative: 1 %
HCT: 40.8 % (ref 39.0–52.0)
Hemoglobin: 13.7 g/dL (ref 13.0–17.0)
Immature Granulocytes: 1 %
Lymphocytes Relative: 15 %
Lymphs Abs: 0.6 K/uL — ABNORMAL LOW (ref 0.7–4.0)
MCH: 28.4 pg (ref 26.0–34.0)
MCHC: 33.6 g/dL (ref 30.0–36.0)
MCV: 84.5 fL (ref 80.0–100.0)
Monocytes Absolute: 0.4 K/uL (ref 0.1–1.0)
Monocytes Relative: 11 %
Neutro Abs: 2.8 K/uL (ref 1.7–7.7)
Neutrophils Relative %: 71 %
Platelets: 84 K/uL — ABNORMAL LOW (ref 150–400)
RBC: 4.83 MIL/uL (ref 4.22–5.81)
RDW: 14.6 % (ref 11.5–15.5)
WBC: 3.9 K/uL — ABNORMAL LOW (ref 4.0–10.5)
nRBC: 0 % (ref 0.0–0.2)

## 2024-12-10 LAB — POCT URINE DRUG SCREEN - MANUAL ENTRY (I-SCREEN)
POC Amphetamine UR: NOT DETECTED
POC Buprenorphine (BUP): NOT DETECTED
POC Cocaine UR: NOT DETECTED
POC Marijuana UR: NOT DETECTED
POC Methadone UR: NOT DETECTED
POC Methamphetamine UR: NOT DETECTED
POC Morphine: NOT DETECTED
POC Oxazepam (BZO): POSITIVE — AB
POC Oxycodone UR: NOT DETECTED
POC Secobarbital (BAR): NOT DETECTED

## 2024-12-10 LAB — LIPID PANEL
Cholesterol: 335 mg/dL — ABNORMAL HIGH (ref 0–200)
HDL: >135 mg/dL
Triglycerides: 114 mg/dL
VLDL: 23 mg/dL (ref 0–40)

## 2024-12-10 LAB — ETHANOL: Alcohol, Ethyl (B): <15 mg/dL

## 2024-12-10 LAB — TSH: TSH: 1.21 u[IU]/mL (ref 0.350–4.500)

## 2024-12-10 MED ORDER — HYDROXYZINE HCL 25 MG PO TABS
25.0000 mg | ORAL_TABLET | Freq: Four times a day (QID) | ORAL | Status: DC | PRN
  Administered 2024-12-10 (×2): 25 mg via ORAL
  Filled 2024-12-10 (×2): qty 1

## 2024-12-10 MED ORDER — CHLORDIAZEPOXIDE HCL 25 MG PO CAPS: 25.0000 mg | ORAL_CAPSULE | Freq: Four times a day (QID) | ORAL | Status: DC

## 2024-12-10 MED ORDER — CHLORDIAZEPOXIDE HCL 25 MG PO CAPS
25.0000 mg | ORAL_CAPSULE | Freq: Four times a day (QID) | ORAL | Status: DC | PRN
  Administered 2024-12-10: 25 mg via ORAL
  Filled 2024-12-10: qty 1

## 2024-12-10 MED ORDER — GABAPENTIN 100 MG PO CAPS
100.0000 mg | ORAL_CAPSULE | Freq: Three times a day (TID) | ORAL | Status: DC
  Administered 2024-12-10 – 2024-12-11 (×3): 100 mg via ORAL
  Filled 2024-12-10 (×3): qty 1

## 2024-12-10 MED ORDER — CHLORDIAZEPOXIDE HCL 25 MG PO CAPS: 25.0000 mg | ORAL_CAPSULE | Freq: Every day | ORAL | Status: DC

## 2024-12-10 MED ORDER — ADULT MULTIVITAMIN W/MINERALS CH
1.0000 | ORAL_TABLET | Freq: Every day | ORAL | Status: DC
  Administered 2024-12-10 – 2024-12-15 (×6): 1 via ORAL
  Filled 2024-12-10: qty 7
  Filled 2024-12-10 (×6): qty 1

## 2024-12-10 MED ORDER — OLANZAPINE 10 MG IM SOLR: 5.0000 mg | Freq: Three times a day (TID) | INTRAMUSCULAR | Status: DC | PRN

## 2024-12-10 MED ORDER — AMLODIPINE BESYLATE 5 MG PO TABS: 5.0000 mg | ORAL_TABLET | Freq: Every day | ORAL | Status: DC

## 2024-12-10 MED ORDER — ACETAMINOPHEN 325 MG PO TABS
650.0000 mg | ORAL_TABLET | Freq: Four times a day (QID) | ORAL | Status: DC | PRN
  Administered 2024-12-10: 650 mg via ORAL
  Filled 2024-12-10: qty 2

## 2024-12-10 MED ORDER — LOPERAMIDE HCL 2 MG PO CAPS: 2.0000 mg | ORAL_CAPSULE | ORAL | Status: DC | PRN

## 2024-12-10 MED ORDER — CHLORDIAZEPOXIDE HCL 25 MG PO CAPS: 25.0000 mg | ORAL_CAPSULE | ORAL | Status: DC

## 2024-12-10 MED ORDER — OLANZAPINE 5 MG PO TBDP: 5.0000 mg | ORAL_TABLET | Freq: Three times a day (TID) | ORAL | Status: DC | PRN

## 2024-12-10 MED ORDER — TRAZODONE HCL 50 MG PO TABS
50.0000 mg | ORAL_TABLET | Freq: Every evening | ORAL | Status: DC | PRN
  Administered 2024-12-10: 50 mg via ORAL
  Filled 2024-12-10: qty 1

## 2024-12-10 MED ORDER — CHLORDIAZEPOXIDE HCL 25 MG PO CAPS
25.0000 mg | ORAL_CAPSULE | Freq: Once | ORAL | Status: AC
  Administered 2024-12-10: 25 mg via ORAL
  Filled 2024-12-10: qty 1

## 2024-12-10 MED ORDER — ONDANSETRON HCL 4 MG/2ML IJ SOLN
INTRAMUSCULAR | Status: AC
  Administered 2024-12-10: 4 mg via INTRAMUSCULAR
  Filled 2024-12-10: qty 2

## 2024-12-10 MED ORDER — THIAMINE HCL 100 MG/ML IJ SOLN
100.0000 mg | Freq: Once | INTRAMUSCULAR | Status: AC
  Administered 2024-12-10: 100 mg via INTRAMUSCULAR
  Filled 2024-12-10: qty 2

## 2024-12-10 MED ORDER — METOPROLOL TARTRATE 25 MG PO TABS
25.0000 mg | ORAL_TABLET | Freq: Once | ORAL | Status: AC
  Administered 2024-12-10: 25 mg via ORAL
  Filled 2024-12-10: qty 1

## 2024-12-10 MED ORDER — LORAZEPAM 2 MG/ML IJ SOLN: 2.0000 mg | Freq: Once | INTRAMUSCULAR | Status: DC

## 2024-12-10 MED ORDER — ONDANSETRON 4 MG PO TBDP
4.0000 mg | ORAL_TABLET | Freq: Four times a day (QID) | ORAL | Status: DC | PRN
  Filled 2024-12-10: qty 1

## 2024-12-10 MED ORDER — ALUM & MAG HYDROXIDE-SIMETH 200-200-20 MG/5ML PO SUSP: 30.0000 mL | ORAL | Status: DC | PRN

## 2024-12-10 MED ORDER — MAGNESIUM HYDROXIDE 400 MG/5ML PO SUSP: 30.0000 mL | Freq: Every day | ORAL | Status: DC | PRN

## 2024-12-10 MED ORDER — LORAZEPAM 2 MG/ML IJ SOLN
INTRAMUSCULAR | Status: AC
  Filled 2024-12-10: qty 1

## 2024-12-10 MED ORDER — SERTRALINE HCL 50 MG PO TABS
50.0000 mg | ORAL_TABLET | Freq: Every day | ORAL | Status: DC
  Administered 2024-12-10 – 2024-12-15 (×6): 50 mg via ORAL
  Filled 2024-12-10 (×3): qty 1
  Filled 2024-12-10: qty 7
  Filled 2024-12-10 (×3): qty 1

## 2024-12-10 MED ORDER — ONDANSETRON HCL 4 MG/2ML IJ SOLN: 4.0000 mg | Freq: Once | INTRAMUSCULAR | Status: AC

## 2024-12-10 MED ORDER — MAGNESIUM HYDROXIDE 400 MG/5ML PO SUSP: 15.0000 mL | Freq: Every day | ORAL | Status: DC | PRN

## 2024-12-10 MED ORDER — MELATONIN 5 MG PO TABS
5.0000 mg | ORAL_TABLET | Freq: Every day | ORAL | Status: DC
  Administered 2024-12-10 – 2024-12-14 (×5): 5 mg via ORAL
  Filled 2024-12-10 (×2): qty 1
  Filled 2024-12-10: qty 7
  Filled 2024-12-10 (×3): qty 1

## 2024-12-10 MED ORDER — BUPROPION HCL ER (XL) 150 MG PO TB24: 150.0000 mg | ORAL_TABLET | Freq: Every day | ORAL | Status: DC

## 2024-12-10 MED ORDER — CHLORDIAZEPOXIDE HCL 25 MG PO CAPS: 25.0000 mg | ORAL_CAPSULE | Freq: Three times a day (TID) | ORAL | Status: DC

## 2024-12-10 NOTE — ED Provider Notes (Addendum)
 Behavioral Health Progress Note  Date and Time: 12/10/2024 12:08 PM Name: [name] [name] MRN:  [id number]  HPI: [name] [name] is a 64 year old AAM with a mental health history of alcohol  use d/o anxiety, depression and medical history of hypertension, who presented voluntarily as a walk-in earlier today morning (12/10/24)  to [facility] seeking detox from alcohol . Patient was [facility] with a goal of achieving that goal.   24 hr chart review: Patient did not have any events overnight, spent limited amount of time on our unit prior to this morning. Rested well prior to being seen as per the nursing documentation.  Patient assessment note, 12/10/2024: Patient was speaking with this writer earlier today morning when he had a syncopal episode, and tried grabbing unto the nurses' station in order to maintain his balance. He was able to remain standing and was lowered to a sitting position on the floor by writer, and was awake and alert the entire time, and reported feeling weak. Vitals checked by nursing and hypotensive with SBP in the 80s & DBP in the 60s. Writer encouraged nursing to push fluids (water  & Gatorade), after which BP improved slightly, with SBP in the low 100s and DBP  in the low 90s. Fluids are continued to be pushed at this time.  Patient had reported to writer prior to the syncopal episode that he drinks alcohol  in an effort to self-medicate his anxiety, and has been doing so for several years.  Patient also reports that he has been taking Wellbutrin  for several years now, noted to be on this medication at 150 mg.  Patient educated that this medication will be discontinued, as it has the tendency to worsen depressive symptoms,, and the rationale for this explained to patient, who is agreeable with discontinuing the medication.  Patient educated that the medication will be replaced with Zoloft  which is more calming for GAD & MDD, and is agreeable to  trials.  Patient reports that he had been drinking 24 ounce bottles of beers x 3 bottles over a time period 6 months since his last relapse prior to presenting for this encounter. He reports that he had maintained his sobriety for one year prior to that episode. Reports that he takes Gabapentin  for neuropathy, but we are holding off on this medication at this time, and will continue to monitor vital signs and will reorder when BP is at a higher range.  Pt shares that he is retired and resides alone. Reports energy level as low, appetite as poor, generalized body pains, reports mental clouding, and some visual difficulties, but states that this is related to his withdrawals from alcohol .. Denies use of any other substances. Denies ever having any seizure activities in the past, denies complicated withdrawals in the past. Denies SI, denies HI, denies AVH, denies paranoia and denies delusional thinking.  Labs Reviewed:Seems like BAL was not completed in the ER, and other labs are pending.   Medications adjustments: Discontinued Wellbutrin  150 mg daily due to worsening of anxiety, started Zoloft  50 mg daily for MDD/GAD. Added melatonin for sleep at 5 mg nightly, dc'd Trazodone  due to syncope earlier today morning, dc'd Hydroxyzine  due to syncopal episode.  Disposition: Patient not interested in rehabilitation at this time and would like outpatient services and is currently coordinating with CSW for those services.  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)  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)  Alcohol  use disorder,  severe, dependence (HCC)  GAD (generalized anxiety disorder)   Total Time spent with patient: 45 minutes  Past Psychiatric History: alcohol  use d/o, GAD, MDD Past Medical History:  Past Medical History:  Diagnosis Date   Alcohol  abuse    Anxiety and depression    Bilateral primary osteoarthritis of knee    Carpal  tunnel syndrome of right wrist Dx 2015   Cervical stenosis of spine    MRI 10/2021   Complication of anesthesia    Essential hypertension    Gallbladder sludge    GERD (gastroesophageal reflux disease) Dx 2007   Hepatic steatosis    History of alcoholic hepatitis    History of CVA (cerebrovascular accident)    05/2021 MRI brain: Mild chronic microvascular ischemic change in the white matter. Small chronic infarct left superior cerebellum.  Also, chronic microhemorrhage, ? 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   Sarcoidosis of lung Dx 1989   Seborrheic dermatitis    Stroke [facility])     Family History:  Family History  Problem Relation Age of Onset   Diabetes Father    Hypertension Father    Hypertension Paternal Grandfather    Alcohol  abuse Paternal Grandfather    Alcohol  abuse Maternal Grandfather    Alcohol  abuse Maternal Grandmother    Alcohol  abuse Paternal Grandmother    Colon cancer Neg Hx    Rectal cancer Neg Hx    Stomach cancer Neg Hx    Bipolar disorder Neg Hx    Depression Neg Hx     Family Psychiatric  History: not provided Social History:  Social History   Socioeconomic History   Marital status: Single    Spouse name: Not on file   Number of children: 2   Years of education: 13 yrs   Highest education level: Not on file  Occupational History   Occupation: custodian    Comment: [employer]  Tobacco Use   Smoking status: Never    Passive exposure: Never   Smokeless tobacco: Never  Vaping Use   Vaping status: Never Used  Substance and Sexual Activity   Alcohol  use: Yes    Comment: 2 beer last night 07-23-23   Drug use: Not Currently    Types: Marijuana    Comment: Patient denies    Sexual activity: Not Currently  Other Topics  Concern   Not on file  Social History Narrative   ** Merged History Encounter **       Born and raised in [place], [place] by both parents. He has 2 sisters and is the middle child. He remains close with family. He was married for 16 yrs and seperated in 2005. He has 2 boys who are in their 20's now. He lives alone in [place]. He is working as a    Forensic Psychologist handed  Lives in a one story home    Social Drivers of Health   Tobacco Use: Low Risk (08/07/2024)   Patient History    Smoking Tobacco Use: Never    Smokeless Tobacco Use: Never    Passive Exposure: Never  Financial Resource Strain: Not on file  Food Insecurity: No Food Insecurity (12/10/2024)   Epic    Worried  About Running Out of Food in the Last Year: Never true    Ran Out of Food in the Last Year: Never true  Transportation Needs: Unmet Transportation Needs (12/10/2024)   Epic    Lack of Transportation (Medical): Yes    Lack of Transportation (Non-Medical): Yes  Physical Activity: Not on file  Stress: No Stress Concern Present (06/11/2023)   Received from [facility] of Occupational Health - Occupational Stress Questionnaire    Feeling of Stress : Not at all  Social Connections: Unknown (02/15/2024)   Social Connection and Isolation Panel    Frequency of Communication with Friends and Family: Twice a week    Frequency of Social Gatherings with Friends and Family: Once a week    Attends Religious Services: Not on Insurance Claims Handler of Clubs or Organizations: Not on file    Attends Banker Meetings: Not on file    Marital Status: Not on file  Intimate Partner Violence: Not At Risk (12/10/2024)   Epic    Fear of Current or Ex-Partner: No    Emotionally Abused: No    Physically Abused: No    Sexually Abused: No  Depression (PHQ2-9): High Risk (12/10/2024)   Depression (PHQ2-9)    PHQ-2 Score: 24  Alcohol  Screen: Not on file  Housing: Low Risk (02/15/2024)   Housing Stability Vital  Sign    Unable to Pay for Housing in the Last Year: No    Number of Times Moved in the Last Year: 0    Homeless in the Last Year: No  Utilities: Not At Risk (12/10/2024)   Epic    Threatened with loss of utilities: No  Health Literacy: Adequate Health Literacy (03/28/2024)   B1300 Health Literacy    Frequency of need for help with medical instructions: Never    Sleep: Fair  Appetite:  Fair  Current Medications:  Current Facility-Administered Medications  Medication Dose Route Frequency Provider Last Rate Last Admin   acetaminophen  (TYLENOL ) tablet 650 mg  650 mg Oral Q6H PRN Onuoha, Chinwendu V, NP   650 mg at 12/10/24 0105   alum & mag hydroxide-simeth (MAALOX/MYLANTA) 200-200-20 MG/5ML suspension 30 mL  30 mL Oral Q4H PRN Onuoha, Chinwendu V, NP       chlordiazePOXIDE  (LIBRIUM ) capsule 25 mg  25 mg Oral Q6H PRN Onuoha, Chinwendu V, NP       hydrOXYzine  (ATARAX ) tablet 25 mg  25 mg Oral Q6H PRN Onuoha, Chinwendu V, NP   25 mg at 12/10/24 9355   loperamide  (IMODIUM ) capsule 2-4 mg  2-4 mg Oral PRN Onuoha, Chinwendu V, NP       LORazepam  (ATIVAN ) injection 2 mg  2 mg Intramuscular Once Vernie Vinciguerra, Donia, NP       magnesium  hydroxide (MILK OF MAGNESIA) suspension 15 mL  15 mL Oral Daily PRN Onuoha, Chinwendu V, NP       melatonin tablet 5 mg  5 mg Oral QHS Alieu Finnigan, NP       multivitamin with minerals tablet 1 tablet  1 tablet Oral Daily Onuoha, Chinwendu V, NP       OLANZapine  (ZYPREXA ) injection 5 mg  5 mg Intramuscular TID PRN Onuoha, Chinwendu V, NP       OLANZapine  zydis (ZYPREXA ) disintegrating tablet 5 mg  5 mg Oral TID PRN Onuoha, Chinwendu V, NP       ondansetron  (ZOFRAN -ODT) disintegrating tablet 4 mg  4 mg Oral Q6H PRN Onuoha, Chinwendu  V, NP       sertraline  (ZOLOFT ) tablet 50 mg  50 mg Oral Daily Royalti Schauf, NP       Current Outpatient Medications  Medication Sig Dispense Refill   escitalopram  (LEXAPRO ) 20 MG tablet Take 1.5 tablets (30 mg total) by mouth daily. 90  tablet 1   gabapentin  (NEURONTIN ) 300 MG capsule Take 300 mg by mouth 3 (three) times daily.      Labs  Lab Results:  Admission on 12/10/2024  Component Date Value Ref Range Status   POC Amphetamine UR 12/10/2024 None Detected  NONE DETECTED (Cut Off Level 1000 ng/mL) Final   POC Secobarbital (BAR) 12/10/2024 None Detected  NONE DETECTED (Cut Off Level 300 ng/mL) Final   POC Buprenorphine (BUP) 12/10/2024 None Detected  NONE DETECTED (Cut Off Level 10 ng/mL) Final   POC Oxazepam  (BZO) 12/10/2024 None Detected  NONE DETECTED (Cut Off Level 300 ng/mL) Final   POC Cocaine UR 12/10/2024 None Detected  NONE DETECTED (Cut Off Level 300 ng/mL) Final   POC Methamphetamine UR 12/10/2024 None Detected  NONE DETECTED (Cut Off Level 1000 ng/mL) Final   POC Morphine  12/10/2024 None Detected  NONE DETECTED (Cut Off Level 300 ng/mL) Final   POC Methadone UR 12/10/2024 None Detected  NONE DETECTED (Cut Off Level 300 ng/mL) Final   POC Oxycodone  UR 12/10/2024 None Detected  NONE DETECTED (Cut Off Level 100 ng/mL) Final   POC Marijuana UR 12/10/2024 None Detected  NONE DETECTED (Cut Off Level 50 ng/mL) Final    Blood Alcohol  level:  Lab Results  Component Value Date   ETH 320 (HH) 07/19/2023   ETH 103 (H) 04/22/2023    Metabolic Disorder Labs: Lab Results  Component Value Date   HGBA1C 5.7 (H) 01/21/2024   MPG 117 01/21/2024   MPG 126 11/12/2022   No results found for: PROLACTIN Lab Results  Component Value Date   CHOL 314 (H) 11/10/2022   TRIG 219 (H) 10/20/2023   HDL >135 11/10/2022   CHOLHDL NOT CALCULATED 11/10/2022   VLDL 22 11/10/2022   LDLCALC NOT CALCULATED 11/10/2022   LDLCALC 142 (H) 04/13/2022    Therapeutic Lab Levels: No results found for: LITHIUM No results found for: VALPROATE No results found for: CBMZ  Physical Findings   AIMS    Flowsheet Row [facility]) [facility] (Discharged[facility]  [facility] (Discharged) [facility]  AIMS Total Score 0 0 0   AUDIT    Flowsheet Row Ad[facility]) [facility] Ad[facility] (Discharged) [facility] Ad[facility] (Discharged) [facility] Admission (Discharged) from 09[facility]06[facility]2014 [facility] Admission (Discharged) from [facility]  Alcohol  Use Disorder Identification Test Final Score (AUDIT) 40 34 35 26 23   GAD-7    Flowsheet Row Office Visit from 04/15/2024 in [facility] Office Visit from 03/28/2024 in [facility] [place] - A Dept Of [place]. [facility] Office Visit from 02/15/2024 in [facility] - A Dept Of [place]. [facility] Office Visit from 05/10/2023 in [facility] [place] - A Dept Of [place]. [facility] Office Visit from 02/21/2023 in [facility] [place] -  A Dept Of [place]. [facility]  Total GAD-7 Score 11 10 11 14 11    PHQ2-9    Flowsheet Row ED from 12/10/2024 in [facility] Office Visit from 04/15/2024 in [facility] Office Visit from 03/28/2024 in [facility] [place] - A Dept Of [place]. [facility] Office Visit from 02/15/2024 in [facility] [place] - A Dept Of [place]. [facility] Office Visit from 05/10/2023 in [facility] [place] - A Dept Of [place]. [facility]  PHQ-2 Total Score 6 1 2 3 6   PHQ-9 Total Score 24 -- 7 7 20    Flowsheet Row ED from 12/10/2024 in [facility] ED from 12/09/2024 in [facility] Office Visit from 04/15/2024 in [facility]  C-SSRS RISK CATEGORY No Risk No Risk No Risk     Musculoskeletal  Strength & Muscle Tone: within normal limits Gait & Station: normal Patient leans: N/A  Psychiatric Specialty Exam  Presentation  General Appearance:  Appropriate for Environment  Eye Contact: Fair  Speech: Clear and Coherent  Speech Volume: Normal  Handedness: Right   Mood and Affect  Mood: Anxious; Depressed  Affect: Appropriate   Thought Process  Thought Processes: Coherent  Descriptions of Associations:Intact  Orientation:Full (Time, Place and Person)  Thought Content:Logical  Diagnosis of Schizophrenia or Schizoaffective disorder in past: No    Hallucinations:Hallucinations: None  Ideas of Reference:None  Suicidal Thoughts:Suicidal Thoughts: No  Homicidal Thoughts:Homicidal Thoughts: No   Sensorium  Memory: Immediate Fair  Judgment: Fair  Insight: Fair   Art Therapist  Concentration: Fair  Attention Span: Fair  Recall: Fair  Fund of Knowledge: Fair  Language: Fair   Psychomotor Activity  Psychomotor Activity: Psychomotor Activity: Normal   Assets  Assets: Desire for Improvement   Sleep  Sleep: Sleep: Fair  Estimated Sleeping Duration (Last 24 Hours): 0.00 hours  Nutritional Assessment (For OBS and [facility] admissions only) Has the patient had a weight loss or gain of 10 pounds or more in the last 3 months?: No Has the patient had a decrease in food intake/or appetite?: No Does the patient have dental problems?: No Does the patient have eating habits or behaviors that may be indicators of an eating disorder including binging or inducing vomiting?: No Has the patient recently lost weight without trying?: 0 Has the patient been eating poorly because of a decreased appetite?: 0 Malnutrition Screening Tool Score: 0    Physical Exam  Physical Exam Vitals and  nursing note reviewed.  HENT:     Head: Normocephalic.  Eyes:     Pupils: Pupils are equal, round, and reactive to light.  Neurological:     General: No focal deficit present.     Mental Status: He is alert and oriented to person, place, and time.    Review of Systems  Psychiatric/Behavioral:  Positive for depression and substance abuse. Negative for hallucinations, memory loss and suicidal ideas. The patient is nervous/anxious and has insomnia.   All other systems reviewed and are negative.  Blood pressure (!) 139/90, pulse 82, temperature 97.7 F (36.5 C), temperature source Oral, resp. rate 18, SpO2 97%. There is no height or weight on file to calculate BMI.  Treatment Plan Summary: Safety and Monitoring: Voluntary admission to inpatient psychiatric unit for safety, stabilization and treatment Daily contact with patient to assess and evaluate symptoms and progress  in treatment Patient's case to be discussed in multi-disciplinary team meeting Observation Level : q15 minute checks Vital signs: q12 hours Precautions: Safety  Long Term Goal(s): Improvement in symptoms so as ready for discharge  Short Term Goals: Ability to identify changes in lifestyle to reduce recurrence of condition will improve, Ability to verbalize feelings will improve, Ability to disclose and discuss suicidal ideas, Ability to demonstrate self-control will improve, Ability to identify and develop effective coping behaviors will improve, Ability to maintain clinical measurements within normal limits will improve, Compliance with prescribed medications will improve, and Ability to identify triggers associated with substance abuse/mental health issues will improve  Medications:  LORazepam   2 mg Intramuscular Once   melatonin  5 mg Oral QHS   multivitamin with minerals  1 tablet Oral Daily   sertraline   50 mg Oral Daily   PRNS:  acetaminophen , alum & mag hydroxide-simeth, chlordiazePOXIDE , hydrOXYzine ,  loperamide , magnesium  hydroxide, OLANZapine , OLANZapine  zydis, ondansetron    Discharge Planning: Social work and case management to assist with discharge planning and identification of hospital follow-up needs prior to discharge Estimated LOS: 3-5 days Discharge Concerns: Need to establish a safety plan; Medication compliance and effectiveness Discharge Goals: Return home with outpatient referrals for mental health follow-up including medication management/psychotherapy  I certify that inpatient services furnished can reasonably be expected to improve the patient's condition.    Donia Snell, NP 12/10/2024 12:08 PM

## 2024-12-10 NOTE — ED Notes (Signed)
 Pt received zofran  IM and states that he is feeling a little better.  Encouraged ice chip and Gator Aide and pt is trying sips and chips.  BP better when lying down.  Encouraged pt to ask for assistance to get up and if he does get up to rise slowly.

## 2024-12-10 NOTE — BH Assessment (Addendum)
 Comprehensive Clinical Assessment (CCA) Note  12/10/2024 [name] [name] [id number]  Disposition: [name] Friday, NP, recommends admission to [facility].   The patient demonstrates the following risk factors for suicide: Chronic risk factors for suicide include: psychiatric disorder of anxiety and substance use disorder. Acute risk factors for suicide include: social withdrawal/isolation. Protective factors for this patient include: responsibility to others (children, family) and hope for the future. Considering these factors, the overall suicide risk at this point appears to be moderate. Patient is not appropriate for outpatient follow up.  [name] [name] is a 64 year old male presenting as a voluntary walk-in to [facility] requesting alcohol  detox. Patient reports history of anxiety. Patient denied SI, HI, psychosis and drug usage.   Patient reports drinking on and off for the past 30 years. Patient reports drinking 2 quarts of malt liquor prior to arrival. Patient reports drinking alcohol  daily. Patient reports he has always had high anxiety and he uses alcohol  to self-medicate. Patient reports he does not sleep at night and will sleep a lot during the daytime. Patient reports poor appetite.   Patient does not have a therapist or psychiatrist. Patient is not taking any psychiatric medications. Patient denied prior psych hospitalizations, suicide attempts and self-harming behaviors.   Patient resides alone. Patient reports being married and that he has been separated for over 20 years after being married for 18 years. Patient has 2 adult children and reports having a good relationship with them both. Patient is currently retired. Patient denied access to guns. Patient was pleasant and cooperative during assessment.   Chief Complaint:  Chief Complaint  Patient presents with   Alcohol  Problem   Visit Diagnosis:  Major Depressive Disorder    CCA Screening, Triage and Referral (STR)  Patient Reported  Information How did you hear about us ? self What Is the Reason for Your Visit/Call Today? pt presents to [facility] voluntary seeking alcohol  detox. Pt expresses that he has been drinking alcohol  on and off for 30 yrs. Pt confirms that he drank about 2 quarts of malt liquor today before arriving. Pt states I am in my 60s and need to get help. I have troube with anxiety and used alcohol  to self medicate. pt is urgent.  How Long Has This Been Causing You Problems? > than 6 months  What Do You Feel Would Help You the Most Today? Alcohol  or Drug Use Treatment   Have You Recently Had Any Thoughts About Hurting Yourself? No  Are You Planning to Commit Suicide/Harm Yourself At This time? No   Flowsheet Row ED from 12/10/2024 in [facility] ED from 12/09/2024 in [facility] Office Visit from 04/15/2024 in [facility]  C-SSRS RISK CATEGORY No Risk No Risk No Risk    Have you Recently Had Thoughts About Hurting Someone [place]? No  Are You Planning to Harm Someone at This Time? No  Explanation: n/a   Have You Used Any Alcohol  or Drugs in the Past 24 Hours? Yes  How Long Ago Did You Use Drugs or Alcohol ? Prior to arrival What Did You Use and How Much? malt liquor about 2 quarts   Do You Currently Have a Therapist/Psychiatrist? No  Name of Therapist/Psychiatrist:  n/a  Have You Been Recently Discharged From Any Office Practice or Programs? No  Explanation of Discharge From Practice/Program: n/a    CCA Screening Triage Referral Assessment Type of Contact: Face-to-Face  Telemedicine Service Delivery:  n/a Is this Initial or  Reassessment?  N/a Date Telepsych consult ordered in [facility]:   N/a Time Telepsych consult ordered in [facility]:   N/a Location of Assessment: [facility] Assessment Services  Provider Location: [facility] Assessment [facility]   Collateral Involvement: n/a   Does Patient Have a Automotive Engineer  Guardian? No  Legal Guardian Contact Information: n/a  Copy of Legal Guardianship Form: -- (n/a)  Legal Guardian Notified of Arrival: -- (n/a)  Legal Guardian Notified of Pending Discharge: -- (n/a)  If Minor and Not Living with Parent(s), Who has Custody? n/a  Is CPS involved or ever been involved? Never  Is APS involved or ever been involved? Never   Patient Determined To Be At Risk for Harm To Self or Others Based on Review of Patient Reported Information or Presenting Complaint? No  Method: No Plan  Availability of Means: No access or NA  Intent: Vague intent or NA  Notification Required: No need or identified person  Additional Information for Danger to Others Potential: -- (n/a)  Additional Comments for Danger to Others Potential: n/a  Are There Guns or Other Weapons in Your Home? No  Types of Guns/Weapons: n/a  Are These Weapons Safely Secured?                            -- (n/a)  Who Could Verify You Are Able To Have These Secured: n/a  Do You Have any Outstanding Charges, Pending Court Dates, Parole/Probation? none reported  Contacted To Inform of Risk of Harm To Self or Others: Other: Comment   Does Patient Present under Involuntary Commitment? No   [place] of Residence: [place]   Patient Currently Receiving the Following Services: Not Receiving Services   Determination of Need: Urgent (48 hours)   Options For Referral: Chemical Dependency Intensive Outpatient Therapy (CDIOP); Facility-Based Crisis; Other: Comment   CCA Biopsychosocial Patient Reported Schizophrenia/Schizoaffective Diagnosis in Past: No   Strengths: self-awareness   Mental Health Symptoms Depression:  Hopelessness; Worthlessness; Increase/decrease in appetite; Difficulty Concentrating; Fatigue; Sleep (too much or little)   Duration of Depressive symptoms: Duration of Depressive Symptoms: Greater than two weeks   Mania:  N/A   Anxiety:   Worrying; Irritability;  Fatigue; Restlessness; Difficulty concentrating; Sleep   Psychosis:  None   Duration of Psychotic symptoms:    Trauma:  None   Obsessions:  Cause anxiety; Disrupts routine/functioning; Attempts to suppress/neutralize; Recurrent & persistent thoughts/impulses/images   Compulsions:  Driven to perform behaviors/acts; Disrupts with routine/functioning; Poor Insight   Inattention:  N/A   Hyperactivity/Impulsivity:  N/A   Oppositional/Defiant Behaviors:  N/A   Emotional Irregularity:  None   Other Mood/Personality Symptoms:  Depressed/Anxious    Mental Status Exam Appearance and self-care  Stature:  Average   Weight:  Average weight   Clothing:  Casual   Grooming:  Normal   Cosmetic use:  None   Posture/gait:  Normal   Motor activity:  Tremor; Restless   Sensorium  Attention:  Normal   Concentration:  Normal   Orientation:  X5   Recall/memory:  Normal   Affect and Mood  Affect:  Depressed; Anxious   Mood:  Depressed; Anxious   Relating  Eye contact:  Normal   Facial expression:  Responsive; Sad; Anxious   Attitude toward examiner:  Cooperative   Thought and Language  Speech flow: Clear and Coherent   Thought content:  Appropriate to Mood and Circumstances   Preoccupation:  None   Hallucinations:  None   Organization:  Coherent   [employer] of Knowledge:  Average   Intelligence:  Average   Abstraction:  Normal   Judgement:  Impaired   Reality Testing:  Adequate   Insight:  Fair   Decision Making:  Impulsive (As evidenced by alcohol  use)   Social Functioning  Social Maturity:  Impulsive   Social Judgement:  Naive   Stress  Stressors:  Relationship (Pt is unemployed)   Coping Ability:  Normal   Skill Deficits:  Decision making; Activities of daily living; Self-control (Pt reports when he stands, I wobble when I stand and walk.)   Supports:  Support needed (Pt came to [place] to go to [employer])      Religion: Religion/Spirituality Are You A Religious Person?:  ginette) How Might This Affect Treatment?: n/a  Leisure/Recreation: Leisure / Recreation Do You Have Hobbies?:  ginette)  Exercise/Diet: Exercise/Diet Do You Exercise?: No Have You Gained or Lost A Significant Amount of Weight in the Past Six Months?: Yes-Lost Number of Pounds Lost?:  (n/a) Do You Follow a Special Diet?: No Do You Have Any Trouble Sleeping?: Yes Explanation of Sleeping Difficulties: poor nightly sleep   CCA Employment/Education Employment/Work Situation: Employment / Work Situation Employment Situation: Unemployed Patient's Job has Been Impacted by Current Illness: No Has Patient ever Been in Equities Trader?: No  Education: Education Is Patient Currently Attending School?: No Last Grade Completed: 14 Did You Product Manager?: Yes What Type of College Degree Do you Have?: 4 year college Did You Have An Individualized Education Program (IIEP): No Did You Have Any Difficulty At School?: No Patient's Education Has Been Impacted by Current Illness: No   CCA Family/Childhood History Family and Relationship History: Family history Marital status: Single Does patient have children?: Yes How many children?: 2 How is patient's relationship with their children?: good  Childhood History:  Childhood History By whom was/is the patient raised?: Both parents Did patient suffer any verbal/emotional/physical/sexual abuse as a child?: No Did patient suffer from severe childhood neglect?: No Has patient ever been sexually abused/assaulted/raped as an adolescent or adult?: No Was the patient ever a victim of a crime or a disaster?: No Witnessed domestic violence?: No Has patient been affected by domestic violence as an adult?: No   CCA Substance Use Alcohol /Drug Use: Alcohol  / Drug Use Pain Medications: Please see MAR Prescriptions: Please see MAR Over the Counter: Please see MAR History of alcohol  /  drug use?: Yes Longest period of sobriety (when/how long): 1 year Negative Consequences of Use: Work / Programmer, Multimedia, Surveyor, Quantity, Personal relationships Withdrawal Symptoms: Tingling, Tremors, Weakness       ASAM's:  Six Dimensions of Multidimensional Assessment  Dimension 1:  Acute Intoxication and/or Withdrawal Potential:   Dimension 1:  Description of individual's past and current experiences of substance use and withdrawal: Ongoing use of alcohol   Dimension 2:  Biomedical Conditions and Complications:   Dimension 2:  Description of patient's biomedical conditions and  complications: None reported  Dimension 3:  Emotional, Behavioral, or Cognitive Conditions and Complications:  Dimension 3:  Description of emotional, behavioral, or cognitive conditions and complications: Anxiety and depression  Dimension 4:  Readiness to Change:  Dimension 4:  Description of Readiness to Change criteria: Seeking detox and residential tx, stating I need help.  Dimension 5:  Relapse, Continued use, or Continued Problem Potential:  Dimension 5:  Relapse, continued use, or continued problem potential critiera description: Significant risk of relapse; Pt has relapsed before  Dimension 6:  Recovery/Living Environment:  Dimension 6:  Recovery/Iiving environment criteria description: Patient lives alone  ASAM Severity Score: ASAM's Severity Rating Score: 15  ASAM Recommended Level of Treatment: ASAM Recommended Level of Treatment: Level III Residential Treatment   Substance use Disorder (SUD) Substance Use Disorder (SUD)  Checklist Symptoms of Substance Use: Evidence of withdrawal (Comment), Persistent desire or unsuccessful efforts to cut down or control use, Presence of craving or strong urge to use, Continued use despite having a persistent/recurrent physical/psychological problem caused/exacerbated by use, Continued use despite persistent or recurrent social, interpersonal problems, caused or exacerbated by use,  Social, occupational, recreational activities given up or reduced due to use  Recommendations for Services/Supports/Treatments: Recommendations for Services/Supports/Treatments Recommendations For Services/Supports/Treatments: Facility Based Crisis, Individual Therapy  Disposition Recommendation per psychiatric provider:  Recommends admission to [facility].    DSM5 Diagnoses: Patient Active Problem List   Diagnosis Date Noted   Malnutrition of moderate degree 11/04/2023   Acute stress disorder 11/04/2023   Status post surgery 10/08/2023   Alcohol  use disorder, severe, dependence (HCC) 11/11/2022   Alcohol  use disorder, severe, in early remission (HCC) 11/10/2022   Alcoholic peripheral neuropathy 11/10/2022   Alcohol  abuse with intoxication 03/27/2022   Neuropathy 12/30/2021   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) 12/27/2021   Uncomplicated alcohol  dependence (HCC) 06/16/2021   Essential hypertension 04/07/2021   Prediabetes 04/07/2021   Other hyperlipidemia 04/07/2021   Major depressive disorder, severe (HCC) 03/16/2021   History of hyperlipidemia 12/21/2020   Seborrheic dermatitis of scalp 12/21/2020   Substance abuse (HCC) 12/21/2020   Hyperopia of both eyes with astigmatism and presbyopia 12/20/2020   Posterior vitreous detachment of right eye 12/20/2020   MDD (major depressive disorder), recurrent episode, severe (HCC) 06/30/2017   Lateral epicondylitis of right elbow 01/05/2017   Strain, MCP, hand, right, initial encounter 01/05/2017   Vasomotor rhinitis 02/13/2016   Bilateral chronic knee pain 01/11/2016   Chronic pain of right wrist 07/07/2015   Carpal tunnel syndrome of right wrist 05/20/2015   Dislocation of right shoulder joint 05/20/2015   Tear of medial meniscus of right knee 03/24/2015   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HCC) 01/27/2015   Chronic cough 01/15/2015   Onychomycosis of toenail 12/15/2014   Dermatitis of face 11/11/2014   GERD  (gastroesophageal reflux disease)    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

## 2024-12-10 NOTE — ED Notes (Signed)
 RN reviewed medication orders with pt, pt expressed understanding. Pt able to ambulate to medication window for medications- zoloft , librium , multivitamin. Pt currently resting in room. RN encouraged hydration. Pt cooperative, polite. Pt displays visible tremor. Pt did not eat lunch, only a few crackers.

## 2024-12-10 NOTE — ED Notes (Signed)
 Pt sleeping in bed, no acute distress noted. Respirations even and unlabored. Continue to monitor for safety.

## 2024-12-10 NOTE — ED Provider Notes (Signed)
 Facility Based Crisis Admission H&P  Date: 12/10/2024 Patient Name: [name] [name] MRN: [id number] Chief Complaint:  I came in for alcohol  addiction.   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  Patient was seen face-to-face by this provider and chart reviewed.  Patient reports I came in for alcohol  addiction, and I have been using off/on since I was around 64 years old, but I stopped using for a while and now I'm using daily due to my anxiety, I used to take anxiety medication but I've not taken it because it does not go together with drinking. I'm just trying to get some help.  Patient endorses daily drinking for a few months now and states I only drink beer, the malt liquor type, usually about a quart to 3 quarts and I noticed when I'm not drinking, I get the tremors/shakes and I last drank a 40 ounce right before I came in.  Patient denies history of alcohol  withdrawal seizures.  Patient denies other substance use.  Patient reports he lives alone.  He denies access to gun or weapon.  Patient endorses prior alcohol  rehab experience notably at [facility] a couple of times, and other places he cannot remember the names.  Patient reports he is unable to remember the names of his anxiety medication or blood pressure medication and last took his BP meds more than 3 months ago.  Patient completed the PHQ-9 questionnaire and obtained a total score of 24, indicating severe depression.  Will restart patient his antidepressant  and blood pressure medications.  Patient is provided with  opportunity for questions.  He verbalized understanding and is in agreement.  On evaluation, patient is alert, oriented x 3, and cooperative. Speech is clear and coherent. Pt appears casually dressed. Eye contact is fair. Mood is anxious and depressed, affect is congruent with mood. Thought process is coherent and thought content is WDL. Pt denies SI/HI/AVH. There is no objective indication that the patient is responding to internal stimuli. No delusions elicited during this assessment.    PHQ 2-9:  Flowsheet Row ED from 12/10/2024 in [facility] Office Visit from 03/28/2024 in [facility] [place]. [facility] Office Visit from 02/15/2024 in [facility] - A Dept Of [facility]  Thoughts that you would be better off dead, or of hurting yourself in some way Not at all Not at all Not at all  PHQ-9 Total Score 24 7 7     Flowsheet Row ED from 12/10/2024 in [facility] ED from 12/09/2024 in [facility] Office Visit from 04/15/2024 in [facility]  C-SSRS RISK CATEGORY No Risk No Risk No Risk      Total Time spent with patient: 30 minutes  Musculoskeletal  Strength & Muscle Tone: within normal limits Gait & Station: normal Patient leans: N/A  Psychiatric Specialty Exam  Presentation General Appearance:  Appropriate for Environment  Eye Contact: Fair  Speech: Clear and Coherent  Speech Volume: Normal  Handedness: Right   Mood and Affect  Mood: Anxious; Depressed  Affect: Congruent   Thought Process  Thought Processes: Coherent  Descriptions of Associations:Intact  Orientation:Full (Time, Place and Person)  Thought Content:WDL  Diagnosis of Schizophrenia or Schizoaffective disorder in past: No data recorded  Hallucinations:Hallucinations: None  Ideas of Reference:None  Suicidal  Thoughts:Suicidal Thoughts: No  Homicidal Thoughts:Homicidal Thoughts: No   Sensorium  Memory: Immediate Fair  Judgment: Poor  Insight: Poor   Executive Functions  Concentration: Fair  Attention Span: Fair  Recall: Fair  Fund of Knowledge: Fair  Language: Fair   Psychomotor Activity  Psychomotor Activity: Psychomotor Activity: Normal   Assets  Assets: Communication Skills; Desire for Improvement   Sleep  Sleep: Sleep: Poor   Nutritional Assessment (For OBS and FBC admissions only) Has the patient had a weight loss or gain of 10 pounds or more in the last 3 months?: No Has the patient had a decrease in food intake/or appetite?: No Does the patient have dental problems?: No Does the patient have eating habits or behaviors that may be indicators of an eating disorder including binging or inducing vomiting?: No Has the patient recently lost weight without trying?: 0 Has the patient been eating poorly because of a decreased appetite?: 0 Malnutrition Screening Tool Score: 0    Physical Exam Constitutional:      Appearance: He is not ill-appearing.  HENT:     Nose: No congestion.  Pulmonary:     Effort: No respiratory distress.  Chest:     Chest wall: No tenderness.  Neurological:     Mental Status: He is alert and oriented to person, place, and time.  Psychiatric:        Attention and Perception: Attention and perception normal.        Mood and Affect: Mood is anxious and depressed.        Behavior: Behavior is cooperative.        Thought Content: Thought content normal.    Review of Systems  Constitutional:  Negative for chills, diaphoresis and fever.  HENT:  Negative for congestion.   Eyes:  Negative for discharge.  Respiratory:  Negative for cough, shortness of breath and wheezing.   Cardiovascular:  Negative for chest pain and palpitations.  Gastrointestinal:  Negative for diarrhea, nausea and vomiting.  Neurological:  Negative for  dizziness, seizures, loss of consciousness, weakness and headaches.  Psychiatric/Behavioral:  Positive for depression and substance abuse. The patient is nervous/anxious.     Past Psychiatric History: See Chart   Is the patient at risk to self? Yes  Has the patient been a risk to self in the past 6 months? Yes .    Has the patient been a risk to self within the distant past? Yes   Is the patient a risk to others? No   Has the patient been a risk to others in the past 6 months? No   Has the patient been a risk to others within the distant past? No   Past Medical History: See Chart Family History: N/A Social History: N/A  Last Labs:  Admission on 12/10/2024  Component Date Value Ref Range Status   POC Amphetamine UR 12/10/2024 None Detected  NONE DETECTED (Cut Off Level 1000 ng/mL) Final   POC Secobarbital (BAR) 12/10/2024 None Detected  NONE DETECTED (Cut Off Level 300 ng/mL) Final   POC Buprenorphine (BUP) 12/10/2024 None Detected  NONE DETECTED (Cut Off Level 10 ng/mL) Final   POC Oxazepam  (BZO) 12/10/2024 None Detected  NONE DETECTED (Cut Off  Level 300 ng/mL) Final   POC Cocaine UR 12/10/2024 None Detected  NONE DETECTED (Cut Off Level 300 ng/mL) Final   POC Methamphetamine UR 12/10/2024 None Detected  NONE DETECTED (Cut Off Level 1000 ng/mL) Final   POC Morphine  12/10/2024 None Detected  NONE DETECTED (Cut Off Level 300 ng/mL) Final   POC Methadone UR 12/10/2024 None Detected  NONE DETECTED (Cut Off Level 300 ng/mL) Final   POC Oxycodone  UR 12/10/2024 None Detected  NONE DETECTED (Cut Off Level 100 ng/mL) Final   POC Marijuana UR 12/10/2024 None Detected  NONE DETECTED (Cut Off Level 50 ng/mL) Final    Allergies: Oxycodone   Medications:  Facility Ordered Medications  Medication   acetaminophen  (TYLENOL ) tablet 650 mg   alum & mag hydroxide-simeth (MAALOX/MYLANTA) 200-200-20 MG/5ML suspension 30 mL   [COMPLETED] thiamine  (VITAMIN B1) injection 100 mg   multivitamin with  minerals tablet 1 tablet   chlordiazePOXIDE  (LIBRIUM ) capsule 25 mg   hydrOXYzine  (ATARAX ) tablet 25 mg   loperamide  (IMODIUM ) capsule 2-4 mg   ondansetron  (ZOFRAN -ODT) disintegrating tablet 4 mg   OLANZapine  zydis (ZYPREXA ) disintegrating tablet 5 mg   OLANZapine  (ZYPREXA ) injection 5 mg   traZODone  (DESYREL ) tablet 50 mg   chlordiazePOXIDE  (LIBRIUM ) capsule 25 mg   Followed by   NOREEN ON 12/11/2024] chlordiazePOXIDE  (LIBRIUM ) capsule 25 mg   Followed by   NOREEN ON 12/12/2024] chlordiazePOXIDE  (LIBRIUM ) capsule 25 mg   Followed by   NOREEN ON 12/13/2024] chlordiazePOXIDE  (LIBRIUM ) capsule 25 mg   [COMPLETED] chlordiazePOXIDE  (LIBRIUM ) capsule 25 mg   magnesium  hydroxide (MILK OF MAGNESIA) suspension 15 mL   [COMPLETED] metoprolol  tartrate (LOPRESSOR ) tablet 25 mg   amLODipine  (NORVASC ) tablet 5 mg   buPROPion  (WELLBUTRIN  XL) 24 hr tablet 150 mg   PTA Medications  Medication Sig   gabapentin  (NEURONTIN ) 300 MG capsule Take 1 tablet in the morning, 1 tablet in the afternoon, and 2 at bedtime.   acetaminophen  (TYLENOL ) 500 MG tablet Take 500-1,000 mg by mouth every 6 (six) hours as needed (pain.).   amLODipine  (NORVASC ) 5 MG tablet Take 1 tablet (5 mg total) by mouth daily.   Blood Pressure Monitor DEVI Please provide patient with insurance approved blood pressure monitor   cyclobenzaprine  (FLEXERIL ) 10 MG tablet Take 1 tablet (10 mg total) by mouth 3 (three) times daily as needed (muscle spasms, stiffness).   naltrexone  (DEPADE) 50 MG tablet Take 1 tablet (50 mg total) by mouth daily.   gatifloxacin  (ZYMAXID ) 0.5 % SOLN Instill 1 drop into right eye four times a day as directed Store upside down!   prednisoLONE  acetate (PRED FORTE ) 1 % ophthalmic suspension Instill 1 drop into right eye four times a day as directed Please shake well before each use!!   ketorolac  (ACULAR ) 0.5 % ophthalmic solution Instill 1 drop into right eye four times a day as directed   escitalopram  (LEXAPRO ) 20 MG  tablet Take 1.5 tablets (30 mg total) by mouth daily.   buPROPion  (WELLBUTRIN  XL) 150 MG 24 hr tablet Take 1 tablet (150 mg total) by mouth daily.   naltrexone  (DEPADE) 50 MG tablet Take 1 tablet (50 mg total) by mouth at bedtime.   naltrexone  (DEPADE) 50 MG tablet Take 1 tablet (50 mg total) by mouth at bedtime.    Long Term Goals: Improvement in symptoms so as ready for discharge  Short Term Goals: Patient will verbalize feelings in meetings with treatment team members., Patient will attend at least of 50% of the groups daily., Pt will  complete the PHQ9 on admission, day 3 and discharge., Patient will participate in completing the [place] Suicide Severity Rating Scale, Patient will score a low risk of violence for 24 hours prior to discharge, and Patient will take medications as prescribed daily.  Medical Decision Making  Recommend admit to [facility] for alcohol  detox/treatment Recommend CIWA Protocol  Lab Orders         CBC with Differential/Platelet         Comprehensive metabolic panel         Ethanol         Lipid panel         TSH         POCT Urine Drug Screen - (I-Screen)      EKG  Home medications continued -Wellbutrin  XL 24 hr 150 mg PO daily for depression -Norvasc  5 mg PO daily for HTN  Other meds -Lopressor  25 mg PO once for HTN and Tachycardia  Other Prns -Tylenol , Maalox, MOM, Atarax , Trazodone  -Agitation protocol medications   Recommendations  Based on my evaluation the patient does not appear to have an emergency medical condition.  Recommend admit to [facility] for alcohol  detox/treatment Recommend CIWA Protocol  Thurman LULLA Ivans, NP 12/10/2024  1:37 AM

## 2024-12-10 NOTE — Discharge Instructions (Addendum)
 Patient is instructed prior to discharge to:  Take all medications as prescribed by his/her mental healthcare provider. Report any adverse effects and or reactions from the medicines to his/her outpatient provider promptly. Keep all scheduled appointments, to ensure that you are getting refills on time and to avoid any interruption in your medication.  If you are unable to keep an appointment call to reschedule.  Be sure to follow-up with resources and follow-up appointments provided.  Patient has been instructed & cautioned: To not engage in alcohol  and or illegal drug use while on prescription medicines. In the event of worsening symptoms, patient is instructed to call the crisis hotline, 911 and or go to the nearest ED for appropriate evaluation and treatment of symptoms. To follow-up with his/her primary care provider for your other medical issues, concerns and or health care needs.  Information: -National Suicide Prevention Lifeline 1-800-SUICIDE or 417-710-2397.  -988 offers 24/7 access to trained crisis counselors who can help people experiencing mental health-related distress. People can call or text 988 or chat 988lifeline.org for themselves or if they are worried about a loved one who may need crisis support.       [facility]BC Care Management...  Patient will discharge Monday 12/15/2024 to home...  [address]  [name], [place]   RN to arrange transportation  Scripts  Per [email or web address]  Patient has been accepted for their program.   Patient has appointment scheduled for Wednesday 12/24/2024 @ 9:00 am  [facility] [address], [place], [place] [address] Phone: [phone]    Patient will discharge Monday 12/15/2024 to home...  [address]  [name], [place]   RN to arrange transportation  Scripts  Per [email or web address]  Patient has been accepted for their program.   Patient has appointment scheduled for Wednesday 12/24/2024 @  9:00 am  [facility] [address] [place], [place] [address] Phone: [phone]       Writer coordinated and intake assessment for Monday 12/15/2024 @ 10:00 am...  [facility] [address] [place]OND [place], [place] [address] [phone]

## 2024-12-10 NOTE — ED Notes (Signed)
 Pt at the nurses station with provider  Doris NP when he reported that he was feeling dizzy.  Pt assisted to the floor.  He remained alert VS 86/67 sitting on the floor.  HR 85  RR 20.  Pt reported that he was feeing sick at his stomach and that he had some vomiting earlier.  Denies diarrhea.  Pt assisted back to his room.  Orders received for zofran  and some gator aide.

## 2024-12-10 NOTE — Group Note (Signed)
 Group Topic: Wellness  Group Date: 12/10/2024 Start Time: 2030 End Time: 2100 Facilitators: Anice Benton LABOR, NT  Department: [facility]  Number of Participants: 3  Group Focus: check in and clarity of thought Treatment Modality:  Individual Therapy and Patient-Centered Therapy Interventions utilized were assignment and support Purpose: express feelings and regain self-worth  Name: [name] [name] Date of Birth: 1961/10/28  MR: [id number]    Level of Participation: Pt wasn't on Unit  Quality of Participation: N/A Interactions with others: N/A Mood/Affect: N/A Triggers (if applicable): N/A Cognition: N/A Progress: None Response: N/A Plan: patient will be encouraged to attend groups   Patients Problems:  Patient Active Problem List   Diagnosis Date Noted   Malnutrition of moderate degree 11/04/2023   Acute stress disorder 11/04/2023   Status post surgery 10/08/2023   Alcohol  use disorder, severe, dependence (HCC) 11/11/2022   Alcohol  use disorder, severe, in early remission (HCC) 11/10/2022   Alcoholic peripheral neuropathy 11/10/2022   Alcohol  abuse with intoxication 03/27/2022   Neuropathy 12/30/2021   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) 12/27/2021   Uncomplicated alcohol  dependence (HCC) 06/16/2021   Essential hypertension 04/07/2021   Prediabetes 04/07/2021   Other hyperlipidemia 04/07/2021   Major depressive disorder, severe (HCC) 03/16/2021   History of hyperlipidemia 12/21/2020   Seborrheic dermatitis of scalp 12/21/2020   Substance abuse (HCC) 12/21/2020   Hyperopia of both eyes with astigmatism and presbyopia 12/20/2020   Posterior vitreous detachment of right eye 12/20/2020   MDD (major depressive disorder), recurrent episode, severe (HCC) 06/30/2017   Lateral epicondylitis of right elbow 01/05/2017   Strain, MCP, hand, right, initial encounter 01/05/2017   Vasomotor rhinitis 02/13/2016   Bilateral chronic  knee pain 01/11/2016   Chronic pain of right wrist 07/07/2015   Carpal tunnel syndrome of right wrist 05/20/2015   Dislocation of right shoulder joint 05/20/2015   Tear of medial meniscus of right knee 03/24/2015   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HCC) 01/27/2015   Chronic cough 01/15/2015   Onychomycosis of toenail 12/15/2014   Dermatitis of face 11/11/2014   GERD (gastroesophageal reflux disease)    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

## 2024-12-10 NOTE — Care Management (Addendum)
 [facility] Care Management...  Addendum 3:10 pm  Writer will assist patient coordinating with [place] coordinated and intake assessment for Monday 12/15/2024 @ 10:00 am...  [facility] [address] SECOND FLO[place], [place] [address] [phone]      Writer met with patient to discuss discharge planning...  Patient reported previous stay at [facility] uncertain of date(s). Patient reported living by himself in [place]  Patient thinks address is [address] Patient reported drinking 3 24 oz beers daily and on a consistent basis. Patient reported no pending court dates or charges. Patient reported that his car is currently having issues.   Patient has [facility] suggested possibly connecting with Medicaid Transportation   Patient reported looking for out patient program  Writer will refer patient to [facility] CD-IOP

## 2024-12-10 NOTE — Group Note (Signed)
 Group Topic: Relapse and Recovery  Group Date: 12/10/2024 Start Time: 1400 End Time: 1500 Facilitators: Daved Tinnie HERO, RN  Department: [facility]  Number of Participants: 2  Group Focus: substance abuse education Treatment Modality:  Psychoeducation Interventions utilized were exploration Purpose: AA group  Name: [name] [name] Date of Birth: Aug 06, 1961  MR: [id number]    Level of Participation: pt did attend AA group  Plan: patient will be encouraged to attend AA group  Patients Problems:  Patient Active Problem List   Diagnosis Date Noted   Malnutrition of moderate degree 11/04/2023   Acute stress disorder 11/04/2023   Status post surgery 10/08/2023   Alcohol  use disorder, severe, dependence (HCC) 11/11/2022   Alcohol  use disorder, severe, in early remission (HCC) 11/10/2022   Alcoholic peripheral neuropathy 11/10/2022   Alcohol  abuse with intoxication 03/27/2022   Neuropathy 12/30/2021   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) 12/27/2021   Uncomplicated alcohol  dependence (HCC) 06/16/2021   Essential hypertension 04/07/2021   Prediabetes 04/07/2021   Other hyperlipidemia 04/07/2021   Major depressive disorder, severe (HCC) 03/16/2021   History of hyperlipidemia 12/21/2020   Seborrheic dermatitis of scalp 12/21/2020   Substance abuse (HCC) 12/21/2020   Hyperopia of both eyes with astigmatism and presbyopia 12/20/2020   Posterior vitreous detachment of right eye 12/20/2020   MDD (major depressive disorder), recurrent episode, severe (HCC) 06/30/2017   Lateral epicondylitis of right elbow 01/05/2017   Strain, MCP, hand, right, initial encounter 01/05/2017   Vasomotor rhinitis 02/13/2016   Bilateral chronic knee pain 01/11/2016   Chronic pain of right wrist 07/07/2015   Carpal tunnel syndrome of right wrist 05/20/2015   Dislocation of right shoulder joint 05/20/2015   Tear of medial meniscus of right knee 03/24/2015    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HCC) 01/27/2015   Chronic cough 01/15/2015   Onychomycosis of toenail 12/15/2014   Dermatitis of face 11/11/2014   GERD (gastroesophageal reflux disease)    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

## 2024-12-10 NOTE — ED Notes (Signed)
 Pt admitted to [facility] this shift. He was provided with a sandwich, chips, and juice. R hip pain (needs hip replacement) managed with prn tylenol . Compliant with scheduled meds; prn atarax  and trazodone  given. Pt denies SI/HI/AVH. He was anxious, cooperative, silly, fidgety, and pleasant. No behavioral concerns at this time. No acute distress noted. Continue to monitor for safety.

## 2024-12-10 NOTE — ED Notes (Signed)
 Pt is reporting that nausea is improving, but still present. Pt also reports continued anxiety. RN encouraged hydration. Pt currently resting in bed.

## 2024-12-10 NOTE — ED Notes (Addendum)
 Pt continues to rest in bed.  Taking sips and chips.  Reports nausea is improved but does not want to eat anything at this time.  AM meds held per provider this AM due to hypotension, n/v.  Pt reports feeling nervous and anxious.  He has some upper extremity tremor and generalized weakness.  High falls risk.  Pt asked to call for assistance and 1 15 min checks continues.  Denies SI/HI/AVH and depression at this time.

## 2024-12-10 NOTE — Group Note (Signed)
 Group Topic: Relapse and Recovery  Group Date: 12/10/2024 Start Time: 2000 End Time: 2100 Facilitators: Anice Benton LABOR, NT  Department: [facility]  Number of Participants: 4  Group Focus: safety plan, self-awareness, and substance abuse education (AA Group) Treatment Modality:  Patient-Centered Therapy and Solution-Focused Therapy Interventions utilized were group exercise and support Purpose: express feelings, express irrational fears, and relapse prevention strategies  Name: [name] [name] Date of Birth: 1961/07/04  MR: [id number]    Level of Participation: Did Not Attend  Quality of Participation: N/A Interactions with others: N/A Mood/Affect: N/A Triggers (if applicable): N/A Cognition: N/A Progress: None Response: N/A Plan: patient will be encouraged to attend groups   Patients Problems:  Patient Active Problem List   Diagnosis Date Noted   Malnutrition of moderate degree 11/04/2023   Acute stress disorder 11/04/2023   Status post surgery 10/08/2023   Alcohol  use disorder, severe, dependence (HCC) 11/11/2022   Alcohol  use disorder, severe, in early remission (HCC) 11/10/2022   Alcoholic peripheral neuropathy 11/10/2022   Alcohol  abuse with intoxication 03/27/2022   Neuropathy 12/30/2021   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) 12/27/2021   Uncomplicated alcohol  dependence (HCC) 06/16/2021   Essential hypertension 04/07/2021   Prediabetes 04/07/2021   Other hyperlipidemia 04/07/2021   Major depressive disorder, severe (HCC) 03/16/2021   History of hyperlipidemia 12/21/2020   Seborrheic dermatitis of scalp 12/21/2020   Substance abuse (HCC) 12/21/2020   Hyperopia of both eyes with astigmatism and presbyopia 12/20/2020   Posterior vitreous detachment of right eye 12/20/2020   MDD (major depressive disorder), recurrent episode, severe (HCC) 06/30/2017   Lateral epicondylitis of right elbow 01/05/2017   Strain, MCP,  hand, right, initial encounter 01/05/2017   Vasomotor rhinitis 02/13/2016   Bilateral chronic knee pain 01/11/2016   Chronic pain of right wrist 07/07/2015   Carpal tunnel syndrome of right wrist 05/20/2015   Dislocation of right shoulder joint 05/20/2015   Tear of medial meniscus of right knee 03/24/2015   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HCC) 01/27/2015   Chronic cough 01/15/2015   Onychomycosis of toenail 12/15/2014   Dermatitis of face 11/11/2014   GERD (gastroesophageal reflux disease)    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

## 2024-12-10 NOTE — Group Note (Signed)
 Group Topic: Recovery Basics  Group Date: 12/10/2024 Start Time: 2000 End Time: 2100 Facilitators: Tawnya Cloyd SAUNDERS, RN  Department: [facility]  Number of Participants: 5  Group Focus: chemical dependency education Treatment Modality:  Behavior Modification Therapy Interventions utilized were patient education Purpose: relapse prevention strategies  Name: [name] [name] Date of Birth: 12/02/1960  MR: [id number]    Level of Participation: none Quality of Participation: none Interactions with others: sleeping Mood/Affect: sleeping Triggers (if applicable): sleeping Cognition: sleeping Progress: None Response: sleeping Plan: follow-up needed  Patients Problems:  Patient Active Problem List   Diagnosis Date Noted   Malnutrition of moderate degree 11/04/2023   Acute stress disorder 11/04/2023   Status post surgery 10/08/2023   Alcohol  use disorder, severe, dependence (HCC) 11/11/2022   Alcohol  use disorder, severe, in early remission (HCC) 11/10/2022   Alcoholic peripheral neuropathy 11/10/2022   Alcohol  abuse with intoxication 03/27/2022   Neuropathy 12/30/2021   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) 12/27/2021   Uncomplicated alcohol  dependence (HCC) 06/16/2021   Essential hypertension 04/07/2021   Prediabetes 04/07/2021   Other hyperlipidemia 04/07/2021   Major depressive disorder, severe (HCC) 03/16/2021   History of hyperlipidemia 12/21/2020   Seborrheic dermatitis of scalp 12/21/2020   Substance abuse (HCC) 12/21/2020   Hyperopia of both eyes with astigmatism and presbyopia 12/20/2020   Posterior vitreous detachment of right eye 12/20/2020   MDD (major depressive disorder), recurrent episode, severe (HCC) 06/30/2017   Lateral epicondylitis of right elbow 01/05/2017   Strain, MCP, hand, right, initial encounter 01/05/2017   Vasomotor rhinitis 02/13/2016   Bilateral chronic knee pain 01/11/2016   Chronic pain of right  wrist 07/07/2015   Carpal tunnel syndrome of right wrist 05/20/2015   Dislocation of right shoulder joint 05/20/2015   Tear of medial meniscus of right knee 03/24/2015   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HCC) 01/27/2015   Chronic cough 01/15/2015   Onychomycosis of toenail 12/15/2014   Dermatitis of face 11/11/2014   GERD (gastroesophageal reflux disease)    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

## 2024-12-10 NOTE — ED Notes (Signed)
 Pt is feeling better.  Anxiety is much improved.  BP is WNL  Pt came out to eat supper.  No further n/v/d

## 2024-12-10 NOTE — ED Notes (Signed)
 Pt up to the day room with assistance.  He continues to be a little unsteady and light headed.  Walks with assistance.  Attended nursing group.  Denies pain.  No further n/v/d.  He ate a few crackers.  Labs and EKG completed.  NP Doris updated on pt condition.  Pt Ok to take librium  if he is able to eat and drink and BP is up.

## 2024-12-10 NOTE — Group Note (Signed)
 Group Topic: Understanding Self  Group Date: 12/10/2024 Start Time: 1200 End Time: 1220 Facilitators: [name] Tinnie HERO, [name] [name]  Department: [facility]  Number of Participants: 5  Group Focus: feeling awareness/expression Treatment Modality:  Psychoeducation Interventions utilized were patient education Purpose: increase insight  Name: [name] Date of Birth: Jul 19, 1961  MR: [id number]    Level of Participation: pt left group early, saying he was not feeling well  Plan: patient will be encouraged to attend future [name] [name] education groups  Patients Problems:  Patient Active Problem List   Diagnosis Date Noted   Malnutrition of moderate degree 11/04/2023   Acute stress disorder 11/04/2023   Status post surgery 10/08/2023   Alcohol  use disorder, severe, dependence (HCC) 11/11/2022   Alcohol  use disorder, severe, in early remission (HCC) 11/10/2022   Alcoholic peripheral neuropathy 11/10/2022   Alcohol  abuse with intoxication 03/27/2022   Neuropathy 12/30/2021   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) 12/27/2021   Uncomplicated alcohol  dependence (HCC) 06/16/2021   Essential hypertension 04/07/2021   Prediabetes 04/07/2021   Other hyperlipidemia 04/07/2021   Major depressive disorder, severe (HCC) 03/16/2021   History of hyperlipidemia 12/21/2020   Seborrheic dermatitis of scalp 12/21/2020   Substance abuse (HCC) 12/21/2020   Hyperopia of both eyes with astigmatism and presbyopia 12/20/2020   Posterior vitreous detachment of right eye 12/20/2020   MDD (major depressive disorder), recurrent episode, severe (HCC) 06/30/2017   Lateral epicondylitis of right elbow 01/05/2017   Strain, MCP, hand, right, initial encounter 01/05/2017   Vasomotor rhinitis 02/13/2016   Bilateral chronic knee pain 01/11/2016   Chronic pain of right wrist 07/07/2015   Carpal tunnel syndrome of right wrist 05/20/2015   Dislocation of right shoulder joint  05/20/2015   Tear of medial meniscus of right knee 03/24/2015   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HCC) 01/27/2015   Chronic cough 01/15/2015   Onychomycosis of toenail 12/15/2014   Dermatitis of face 11/11/2014   GERD (gastroesophageal reflux disease)    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

## 2024-12-10 NOTE — Group Note (Signed)
 Group Topic: Wellness  Group Date: 12/10/2024 Start Time: 0930 End Time: 1030 Facilitators: Vincente Asberry CROME, RN  Department: [facility]  Number of Participants: 5  Group Focus: nursing group Treatment Modality:  Psychoeducation Interventions utilized were patient education Purpose: increase insight   Medication Education   Name: [name] [name] Date of Birth: [birth year]  MR: [id number]    Level of Participation: active Quality of Participation: cooperative Interactions with others: gave feedback Mood/Affect: anxious Triggers (if applicable):  N/A Cognition: coherent/clear Progress: Moderate Response: Verbalized understanding of his specific medications  Plan: follow-up needed  as pts medications are held due to changes in physical assessment.    Patients Problems:  Patient Active Problem List   Diagnosis Date Noted   Malnutrition of moderate degree 11/04/2023   Acute stress disorder 11/04/2023   Status post surgery 10/08/2023   Alcohol  use disorder, severe, dependence (HCC) 11/11/2022   Alcohol  use disorder, severe, in early remission (HCC) 11/10/2022   Alcoholic peripheral neuropathy 11/10/2022   Alcohol  abuse with intoxication 03/27/2022   Neuropathy 12/30/2021   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) 12/27/2021   Uncomplicated alcohol  dependence (HCC) 06/16/2021   Essential hypertension 04/07/2021   Prediabetes 04/07/2021   Other hyperlipidemia 04/07/2021   Major depressive disorder, severe (HCC) 03/16/2021   History of hyperlipidemia 12/21/2020   Seborrheic dermatitis of scalp 12/21/2020   Substance abuse (HCC) 12/21/2020   Hyperopia of both eyes with astigmatism and presbyopia 12/20/2020   Posterior vitreous detachment of right eye 12/20/2020   MDD (major depressive disorder), recurrent episode, severe (HCC) 06/30/2017   Lateral epicondylitis of right elbow 01/05/2017   Strain, MCP, hand, right, initial encounter  01/05/2017   Vasomotor rhinitis 02/13/2016   Bilateral chronic knee pain 01/11/2016   Chronic pain of right wrist 07/07/2015   Carpal tunnel syndrome of right wrist 05/20/2015   Dislocation of right shoulder joint 05/20/2015   Tear of medial meniscus of right knee 03/24/2015   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HCC) 01/27/2015   Chronic cough 01/15/2015   Onychomycosis of toenail 12/15/2014   Dermatitis of face 11/11/2014   GERD (gastroesophageal reflux disease)    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

## 2024-12-11 DIAGNOSIS — F1024 Alcohol dependence with alcohol-induced mood disorder: Secondary | ICD-10-CM | POA: Diagnosis not present

## 2024-12-11 DIAGNOSIS — F411 Generalized anxiety disorder: Secondary | ICD-10-CM | POA: Diagnosis not present

## 2024-12-11 DIAGNOSIS — R748 Abnormal levels of other serum enzymes: Secondary | ICD-10-CM | POA: Diagnosis not present

## 2024-12-11 DIAGNOSIS — Z79899 Other long term (current) drug therapy: Secondary | ICD-10-CM | POA: Diagnosis not present

## 2024-12-11 MED ORDER — THIAMINE MONONITRATE 100 MG PO TABS
100.0000 mg | ORAL_TABLET | Freq: Every day | ORAL | Status: DC
  Administered 2024-12-11 – 2024-12-15 (×5): 100 mg via ORAL
  Filled 2024-12-11 (×7): qty 1

## 2024-12-11 MED ORDER — ENSURE PLUS HIGH PROTEIN PO LIQD
237.0000 mL | Freq: Two times a day (BID) | ORAL | Status: DC
  Administered 2024-12-12 – 2024-12-15 (×7): 237 mL via ORAL

## 2024-12-11 MED ORDER — HYDROXYZINE HCL 25 MG PO TABS
25.0000 mg | ORAL_TABLET | Freq: Three times a day (TID) | ORAL | Status: DC | PRN
  Administered 2024-12-11 – 2024-12-12 (×3): 25 mg via ORAL
  Filled 2024-12-11 (×3): qty 1

## 2024-12-11 MED ORDER — OLANZAPINE 5 MG PO TBDP: 5.0000 mg | ORAL_TABLET | Freq: Three times a day (TID) | ORAL | Status: DC | PRN

## 2024-12-11 MED ORDER — GABAPENTIN 100 MG PO CAPS
200.0000 mg | ORAL_CAPSULE | Freq: Three times a day (TID) | ORAL | Status: DC
  Administered 2024-12-11 – 2024-12-14 (×9): 200 mg via ORAL
  Filled 2024-12-11 (×9): qty 2

## 2024-12-11 NOTE — Group Note (Signed)
 Group Topic: Relapse and Recovery  Group Date: 12/11/2024 Start Time: 1000 End Time: 1100 Facilitators: Gerome Jolly, NT  Department: [facility]  Number of Participants: 4  Group Focus:  acceptance, chemical dependency issues, and coping skills, relapse prevention, forgiveness of self, guilt and shame Treatment Modality:  Behavior Modification Therapy and Psychoeducation Interventions utilized were Facilitator read from NA IP #6 Recovery and Relapse, exploration, patient education, and problem solving Purpose: enhance coping skills, explore maladaptive thinking, express feelings, and relapse prevention strategies  Name: [name] [name] Date of Birth: 1961/11/11  MR: [id number]    Level of Participation: active Quality of Participation: attentive, cooperative, engaged, and motivated Interactions with others: gave feedback Mood/Affect: appropriate, brightens with interaction, and positive Triggers (if applicable): na Cognition: coherent/clear and goal directed Progress: Moderate Response: pt actively participated in group. Patient identified parts of the NA reading that stood out. Patient reported that having a better understanding of his addiction and the underlying reasons for his multiple relapses  would benefit in the future     Plan: follow-up needed and referral / recommendations patient requested inpatient treatment. Patient completed phone assessment with [facility]   Patients Problems:  Patient Active Problem List   Diagnosis Date Noted   Malnutrition of moderate degree 11/04/2023   Acute stress disorder 11/04/2023   Status post surgery 10/08/2023   Alcohol  use disorder, severe, dependence (HCC) 11/11/2022   Alcohol  use disorder, severe, in early remission (HCC) 11/10/2022   Alcoholic peripheral neuropathy 11/10/2022   Alcohol  abuse with intoxication 03/27/2022   Neuropathy 12/30/2021   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)  12/27/2021   Uncomplicated alcohol  dependence (HCC) 06/16/2021   Essential hypertension 04/07/2021   Prediabetes 04/07/2021   Other hyperlipidemia 04/07/2021   Major depressive disorder, severe (HCC) 03/16/2021   History of hyperlipidemia 12/21/2020   Seborrheic dermatitis of scalp 12/21/2020   Substance abuse (HCC) 12/21/2020   Hyperopia of both eyes with astigmatism and presbyopia 12/20/2020   Posterior vitreous detachment of right eye 12/20/2020   MDD (major depressive disorder), recurrent episode, severe (HCC) 06/30/2017   Lateral epicondylitis of right elbow 01/05/2017   Strain, MCP, hand, right, initial encounter 01/05/2017   Vasomotor rhinitis 02/13/2016   Bilateral chronic knee pain 01/11/2016   Chronic pain of right wrist 07/07/2015   Carpal tunnel syndrome of right wrist 05/20/2015   Dislocation of right shoulder joint 05/20/2015   Tear of medial meniscus of right knee 03/24/2015   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HCC) 01/27/2015   Chronic cough 01/15/2015   Onychomycosis of toenail 12/15/2014   Dermatitis of face 11/11/2014   GERD (gastroesophageal reflux disease)    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

## 2024-12-11 NOTE — ED Notes (Signed)
 Pt sleeping in bed at change of shift. no acute distress noted. CIWA is 1. Medication was given at the bedside and pt tolerated it well. Pt denied pain or discomfort I'm just tired he stated. Denies si/hi/avh. Fluids encouraged. Safety maintained.

## 2024-12-11 NOTE — ED Notes (Signed)
 Patient is in the dayroom participating groups.

## 2024-12-11 NOTE — Care Management (Addendum)
 [facility] Care Management...  Addendum 2:45 pm  Patient completed phone assessment with [facility]  Patient reported that he would like to go to his home and check on his house before going to [facility] if accepted. Patient reported that he could have his sister Lolita [phone] or Aunt [name] [phone] transport him to [facility]   Writer advised patient to reach to [place] and Aunt to confirm that they will transport him .  Writer will follow up with patient's family members to confirm   Addendum 1:36 pm  Per [facility], patient referral has been received. [name] will call back after her meeting to complete phone screening.  Addendum 12:15 pm  Patient requested assistance in getting a paper out of his jeans (in locker) that has family contact numbers  Writer assisted patient in Naval Architect met with patient to discuss discharge planning.  Patient reported that he has been considering residential.  Clinical Research Associate faxed referral to [facility] and emailed referral to [facility]  Patient report that he has several family contact numbers written down in his jeans.   Writer will assist in obtaining piece of paper out of locker/jeans  Per previous conversation with patient appointment was scheduled for CD-IOP @ [facility] 12/15/24 @ 10:00 am  Patient requested assistance with Medicaid Transportation [phone]

## 2024-12-11 NOTE — ED Notes (Signed)
 Pt is pleasant and cooperative and interactive on the unit.  He's participated in groups, smiles easily.  Appetite is improved.  He's been drinking some Gator Aid today.  So far has declined Ensure as he's been eating solid food and was full.  Denies SI/HI/AVH.  Continues to have some mild anxiety and depression.   He is working on d/c plans, possibly to [facility].  CIWAs = 2 today so far.  He still has a mild tremor and anxiety.

## 2024-12-11 NOTE — Group Note (Signed)
 Group Topic: Balance in Life  Group Date: 12/11/2024 Start Time: 1545 End Time: 1645 Facilitators: Alyse Leilani LABOR, NT  Department: [facility]  Number of Participants: 9  Group Focus: acceptance, activities of daily living skills, and affirmation Treatment Modality:  Skills Training Interventions utilized were group exercise Purpose: express feelings and improve communication skills  Name: [name] [name] Date of Birth: 07-22-1961  MR: [id number]    Level of Participation: active Quality of Participation: cooperative, engaged, and motivated Interactions with others: gave feedback Mood/Affect: bright and positive Triggers (if applicable): none Cognition: concrete, goal directed, and insightful Progress: Gaining insight Response: none Plan: patient will be encouraged to keep coming to group  Patients Problems:  Patient Active Problem List   Diagnosis Date Noted   Malnutrition of moderate degree 11/04/2023   Acute stress disorder 11/04/2023   Status post surgery 10/08/2023   Alcohol  use disorder, severe, dependence (HCC) 11/11/2022   Alcohol  use disorder, severe, in early remission (HCC) 11/10/2022   Alcoholic peripheral neuropathy 11/10/2022   Alcohol  abuse with intoxication 03/27/2022   Neuropathy 12/30/2021   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) 12/27/2021   Uncomplicated alcohol  dependence (HCC) 06/16/2021   Essential hypertension 04/07/2021   Prediabetes 04/07/2021   Other hyperlipidemia 04/07/2021   Major depressive disorder, severe (HCC) 03/16/2021   History of hyperlipidemia 12/21/2020   Seborrheic dermatitis of scalp 12/21/2020   Substance abuse (HCC) 12/21/2020   Hyperopia of both eyes with astigmatism and presbyopia 12/20/2020   Posterior vitreous detachment of right eye 12/20/2020   MDD (major depressive disorder), recurrent episode, severe (HCC) 06/30/2017   Lateral epicondylitis of right elbow 01/05/2017    Strain, MCP, hand, right, initial encounter 01/05/2017   Vasomotor rhinitis 02/13/2016   Bilateral chronic knee pain 01/11/2016   Chronic pain of right wrist 07/07/2015   Carpal tunnel syndrome of right wrist 05/20/2015   Dislocation of right shoulder joint 05/20/2015   Tear of medial meniscus of right knee 03/24/2015   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HCC) 01/27/2015   Chronic cough 01/15/2015   Onychomycosis of toenail 12/15/2014   Dermatitis of face 11/11/2014   GERD (gastroesophageal reflux disease)    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

## 2024-12-11 NOTE — Care Management (Signed)
 [facility] Care Management...  Writer met with the client after he reported he needed to get phone numbers he had written down out of his jeans.  Writer went to the [employer] and took out the piece of paper and provided it to the client.  Client reports no further concerns and or questions.
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
## 2024-12-11 NOTE — Care Plan (Signed)
 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)  Alcohol  use disorder, severe, dependence (HCC)  GAD (generalized anxiety disorder)     Current Medications:  Current Facility-Administered Medications  Medication Dose Route Frequency Provider Last Rate Last Admin   alum & mag hydroxide-simeth (MAALOX/MYLANTA) 200-200-20 MG/5ML suspension 30 mL  30 mL Oral Q4H PRN Onuoha, Chinwendu V, NP       chlordiazePOXIDE  (LIBRIUM ) capsule 25 mg  25 mg Oral Q6H PRN Onuoha, Chinwendu V, NP   25 mg at 12/10/24 1306   gabapentin  (NEURONTIN ) capsule 100 mg  100 mg Oral TID Tex Drilling, NP   100 mg at 12/11/24 0935   loperamide  (IMODIUM ) capsule 2-4 mg  2-4 mg Oral PRN Onuoha, Chinwendu V, NP       LORazepam  (ATIVAN ) injection 2 mg  2 mg Intramuscular Once Nkwenti, Doris, NP       magnesium  hydroxide (MILK OF MAGNESIA) suspension 15 mL  15 mL Oral Daily PRN Onuoha, Chinwendu V, NP       melatonin tablet 5 mg  5 mg Oral QHS Nkwenti, Doris, NP   5 mg at 12/10/24 2139   multivitamin with minerals tablet 1 tablet  1 tablet Oral Daily Onuoha, Chinwendu V, NP   1 tablet at 12/11/24 0935   OLANZapine  (ZYPREXA ) injection 5 mg  5 mg Intramuscular TID PRN Onuoha, Chinwendu V, NP       OLANZapine  zydis (ZYPREXA ) disintegrating tablet 5 mg  5 mg Oral TID PRN Onuoha, Chinwendu V, NP       ondansetron  (ZOFRAN -ODT) disintegrating tablet 4 mg  4 mg Oral Q6H PRN Onuoha, Chinwendu V, NP       sertraline  (ZOLOFT ) tablet 50 mg  50 mg Oral Daily Nkwenti, Doris, NP   50 mg at 12/11/24 9064   Current Outpatient Medications  Medication Sig Dispense Refill   escitalopram  (LEXAPRO ) 20 MG tablet Take 1.5 tablets (30 mg total) by mouth daily. 90 tablet 1   gabapentin  (NEURONTIN ) 300 MG capsule Take 300 mg by mouth 3  (three) times daily.     PTA Medications: Prior to Admission medications  Medication Sig Start Date End Date Taking? Authorizing Provider  escitalopram  (LEXAPRO ) 20 MG tablet Take 1.5 tablets (30 mg total) by mouth daily. 07/16/24  Yes Theotis Haze ORN, NP  gabapentin  (NEURONTIN ) 300 MG capsule Take 300 mg by mouth 3 (three) times daily.   Yes [provider]    Patient Stressors: Health problems   Medication change or noncompliance   Substance abuse    Patient Strengths: Ability for insight  Communication skills  Motivation for treatment/growth   Treatment Modalities: Medication Management, Group therapy, Case management,  1 to 1 session with clinician, Psychoeducation, Recreational therapy.   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)  Alcohol  use disorder, severe, dependence (HCC)  GAD (generalized anxiety disorder)   Long Term Goal(s): Improvement in symptoms so as ready for discharge  Short Term Goals: Ability to identify changes in lifestyle to reduce recurrence of condition will improve Ability to verbalize feelings will improve Ability to disclose and discuss suicidal ideas Ability to demonstrate self-control will improve Ability to identify and develop effective coping behaviors will improve Ability to  maintain clinical measurements within normal limits will improve Compliance with prescribed medications will improve Ability to identify triggers associated with substance abuse/mental health issues will improve  Medication Management: Evaluate patient's response, side effects, and tolerance of medication regimen.  Therapeutic Interventions: 1 to 1 sessions, Unit Group sessions and Medication administration.  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  Short Term Goals: Facilitate acceptance of mental health diagnosis and concerns through verbal commitment to aftercare plan and appointments at discharge.  Therapeutic Interventions: Assess for all discharge needs, 1 to 1 time with Child psychotherapist, Explore available resources and support systems, Assess for adequacy in community support network, Educate family and significant other(s) on suicide prevention, Complete Psychosocial Assessment, Interpersonal group therapy.  Evaluation of Outcomes: Progressing   Progress in Treatment: Attending groups: No. Client just arrived to the unit and has not felt well.  Client reports once feeling better he will attend. Participating in groups: No. Taking medication as prescribed: Yes. Toleration medication: No.  Client is taking Librium  (PRN). Family/Significant other contact made: No, will contact:  Client reports he will contact immediate family today. Patient understands diagnosis: Yes. Discussing patient identified problems/goals with staff: Yes. Medical problems stabilized or resolved: As evidenced by:  Client reported feeling light headed and anxious.  Client is on fall precautions for the client.   Denies suicidal/homicidal ideation: Yes. Issues/concerns per patient self-inventory: Yes.  Client reports feelings anxious/on edge and reports being nauseous.  Client's blood pressure was 139/90.  Client's CIWA is a 2.  Client has been in bed. Other: Client's heart rate was 82 BPM.  New problem(s) identified: No, Describe:  NA  New Short Term/Long Term Goal(s): Client reports he wants to stay at [facility] 3[facility]5 to complete his detox from Alcohol .  Client's long term goal is  to complete door to door transfer to a residential facility to gain sobriety and reduce recidivism.   Patient Goals:  to get clean and sober and work on my mental health by entering into a long term facility.  Discharge Plan or Barriers: Client reports no discharge barriers  Reason for Continuation of Hospitalization: Anxiety Depression Medication stabilization Complete door to door transfer for residential treatment.    Estimated Length of Stay: 12/10/24 - 12/14/24  Last 3 [place] Suicide Severity Risk Score: Flowsheet Row ED from 12/10/2024 in [facility] ED from 12/09/2024 in [facility] Office Visit from 04/15/2024 in [facility]  C-SSRS RISK CATEGORY No Risk No Risk No Risk    Last [facility] 2/9 Scores:    12/10/2024   12:44 AM 04/15/2024    2:28 PM 03/28/2024   11:10 AM  Depression screen PHQ 2/9  Decreased Interest 3 1 1   Down, Depressed, Hopeless 3 0 1  PHQ - 2 Score 6 1 2   Altered sleeping 3  1  Tired, decreased energy 3  1  Change in appetite 3  1  Feeling bad or failure about yourself  3  1  Trouble concentrating 3  1  Moving slowly or fidgety/restless 3  0  Suicidal thoughts 0  0  PHQ-9 Score 24  7   Difficult doing work/chores  Somewhat difficult  Somewhat difficult     Data saved with a previous flowsheet row definition    Scribe for Treatment Team: Boleslaw Borghi, LCSW 12/11/2024 10:00 AM

## 2024-12-11 NOTE — Group Note (Signed)
 Group Topic: Change and Accountability  Group Date: 12/11/2024 Start Time: 2030 End Time: 2100 Facilitators: Anice Benton LABOR, NT  Department: [facility]  Number of Participants: 5  Group Focus: clarity of thought, relapse prevention Treatment Modality:  Solution-Focused Therapy Interventions utilized were group exercise and support Purpose: express feelings, relapse prevention strategies, and trigger / craving management  Name: [name] [name] Date of Birth: Jul 02, 1961  MR: [id number]    Level of Participation: active Quality of Participation: cooperative, engaged, and hyperactive Interactions with others: gave feedback Mood/Affect: appropriate, bright, and positive Triggers (if applicable): N/A Cognition: coherent/clear Progress: Moderate Response: Good Plan: follow-up needed  Patients Problems:  Patient Active Problem List   Diagnosis Date Noted   Malnutrition of moderate degree 11/04/2023   Acute stress disorder 11/04/2023   Status post surgery 10/08/2023   Alcohol  use disorder, severe, dependence (HCC) 11/11/2022   Alcohol  use disorder, severe, in early remission (HCC) 11/10/2022   Alcoholic peripheral neuropathy 11/10/2022   Alcohol  abuse with intoxication 03/27/2022   Neuropathy 12/30/2021   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) 12/27/2021   Uncomplicated alcohol  dependence (HCC) 06/16/2021   Essential hypertension 04/07/2021   Prediabetes 04/07/2021   Other hyperlipidemia 04/07/2021   Major depressive disorder, severe (HCC) 03/16/2021   History of hyperlipidemia 12/21/2020   Seborrheic dermatitis of scalp 12/21/2020   Substance abuse (HCC) 12/21/2020   Hyperopia of both eyes with astigmatism and presbyopia 12/20/2020   Posterior vitreous detachment of right eye 12/20/2020   MDD (major depressive disorder), recurrent episode, severe (HCC) 06/30/2017   Lateral epicondylitis of right elbow 01/05/2017   Strain, MCP,  hand, right, initial encounter 01/05/2017   Vasomotor rhinitis 02/13/2016   Bilateral chronic knee pain 01/11/2016   Chronic pain of right wrist 07/07/2015   Carpal tunnel syndrome of right wrist 05/20/2015   Dislocation of right shoulder joint 05/20/2015   Tear of medial meniscus of right knee 03/24/2015   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HCC) 01/27/2015   Chronic cough 01/15/2015   Onychomycosis of toenail 12/15/2014   Dermatitis of face 11/11/2014   GERD (gastroesophageal reflux disease)    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

## 2024-12-11 NOTE — ED Notes (Signed)
 Patient is in the bedroom calm and sleeping. NAD CIWA assessment could not be done at this time.  Will continue to monitor for safety.

## 2024-12-11 NOTE — ED Notes (Signed)
 Pt sleeping in bed, no acute distress noted. Respirations even and unlabored. Continue to monitor for safety.

## 2024-12-12 DIAGNOSIS — R748 Abnormal levels of other serum enzymes: Secondary | ICD-10-CM | POA: Diagnosis not present

## 2024-12-12 DIAGNOSIS — F1024 Alcohol dependence with alcohol-induced mood disorder: Secondary | ICD-10-CM | POA: Diagnosis not present

## 2024-12-12 DIAGNOSIS — Z79899 Other long term (current) drug therapy: Secondary | ICD-10-CM | POA: Diagnosis not present

## 2024-12-12 DIAGNOSIS — F411 Generalized anxiety disorder: Secondary | ICD-10-CM | POA: Diagnosis not present

## 2024-12-12 LAB — CBC
HCT: 38.4 % — ABNORMAL LOW (ref 39.0–52.0)
Hemoglobin: 12.6 g/dL — ABNORMAL LOW (ref 13.0–17.0)
MCH: 28.8 pg (ref 26.0–34.0)
MCHC: 32.8 g/dL (ref 30.0–36.0)
MCV: 87.7 fL (ref 80.0–100.0)
Platelets: 99 K/uL — ABNORMAL LOW (ref 150–400)
RBC: 4.38 MIL/uL (ref 4.22–5.81)
RDW: 14.7 % (ref 11.5–15.5)
WBC: 5.4 K/uL (ref 4.0–10.5)
nRBC: 0 % (ref 0.0–0.2)

## 2024-12-12 LAB — HEPATIC FUNCTION PANEL
ALT: 88 U/L — ABNORMAL HIGH (ref 0–44)
AST: 73 U/L — ABNORMAL HIGH (ref 15–41)
Albumin: 4.4 g/dL (ref 3.5–5.0)
Alkaline Phosphatase: 52 U/L (ref 38–126)
Bilirubin, Direct: 0.3 mg/dL — ABNORMAL HIGH (ref 0.0–0.2)
Indirect Bilirubin: 0.4 mg/dL (ref 0.3–0.9)
Total Bilirubin: 0.7 mg/dL (ref 0.0–1.2)
Total Protein: 7.8 g/dL (ref 6.5–8.1)

## 2024-12-12 NOTE — ED Notes (Signed)
 Patient watching TV. Calm, complaint of anxiety. no physical complaints at this time. Patient in no apparent acute distress. No inappropriate behaviors observed or reported at this time. Environment secured. Safety checks in place per facility protocol. Meds administered per providers orders.

## 2024-12-12 NOTE — Group Note (Signed)
 Group Topic: Recovery Basics  Group Date: 12/12/2024 Start Time: 1430 End Time: 1530 Facilitators: Nicholaus Arlyne BIRCH, NT  Department: [facility]  Number of Participants: 6  Group Focus: daily focus Treatment Modality:  Psychoeducation Interventions utilized were clarification Purpose: express irrational fears  Name: [name] [name] Date of Birth: 1961/01/11  MR: [id number]    Level of Participation: active Quality of Participation: cooperative Interactions with others: gave feedback Mood/Affect: appropriate Triggers (if applicable): N/A Cognition: insightful Progress: Gaining insight Response: N/A Plan: patient will be encouraged to call recovery supports  Patients Problems:  Patient Active Problem List   Diagnosis Date Noted   Malnutrition of moderate degree 11/04/2023   Acute stress disorder 11/04/2023   Status post surgery 10/08/2023   Alcohol  use disorder, severe, dependence (HCC) 11/11/2022   Alcohol  use disorder, severe, in early remission (HCC) 11/10/2022   Alcoholic peripheral neuropathy 11/10/2022   Alcohol  abuse with intoxication 03/27/2022   Neuropathy 12/30/2021   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) 12/27/2021   Uncomplicated alcohol  dependence (HCC) 06/16/2021   Essential hypertension 04/07/2021   Prediabetes 04/07/2021   Other hyperlipidemia 04/07/2021   Major depressive disorder, severe (HCC) 03/16/2021   History of hyperlipidemia 12/21/2020   Seborrheic dermatitis of scalp 12/21/2020   Substance abuse (HCC) 12/21/2020   Hyperopia of both eyes with astigmatism and presbyopia 12/20/2020   Posterior vitreous detachment of right eye 12/20/2020   MDD (major depressive disorder), recurrent episode, severe (HCC) 06/30/2017   Lateral epicondylitis of right elbow 01/05/2017   Strain, MCP, hand, right, initial encounter 01/05/2017   Vasomotor rhinitis 02/13/2016   Bilateral chronic knee pain 01/11/2016    Chronic pain of right wrist 07/07/2015   Carpal tunnel syndrome of right wrist 05/20/2015   Dislocation of right shoulder joint 05/20/2015   Tear of medial meniscus of right knee 03/24/2015   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HCC) 01/27/2015   Chronic cough 01/15/2015   Onychomycosis of toenail 12/15/2014   Dermatitis of face 11/11/2014   GERD (gastroesophageal reflux disease)    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

## 2024-12-12 NOTE — Care Management (Addendum)
 [facility] Care Management...   Addendum 2:45 pm  Patient will discharge Monday 12/15/2024 to home...  [address]  [name], [name]   RN to arrange transportation  Scripts  Per Hague @ [email or web address]  Patient has been accepted for their program.   Patient has appointment scheduled for Wednesday 12/24/2024 @ 9:00 am  [facility] [address] [place], [place] [address] Phone: [phone]    Writer spoke with patient's Aunt and she confirmed that they will make sure patient makes it his appointment   Addendum 11:20 am  Per [name] at [facility], we're going to try and get patient scheduled for intake. [name] request that clinical research associate call after lunch to speak with [name] and schedule patient   Writer spoke with [name] @ [facility], Writer explained that patient has had several ED visits since 2023 due to alcohol  abuse and would benefit from inpatient services.    Addendum 10:16 am  Patient reported calling [facility] Financial Officer [name],  Per patient, he left message for return call    Writer met with patient to discuss discharge planning..  Patient reported that he spoke with his Aunt last night and she has agreed to transport him to residential facility if or when accepted  Writer spoke [name] at [facility], patient has been medical clearance.   Per [name], patient is requested to call and speak with [name] [name] to discuss his [place] plan and the option of [facility] applying for [employer] to cover patients stay/treatment.   Writer provided patient with contact number [phone] for [name]     Once clinical research associate gets confirmation of acceptance from [name]. Writer will will call patients aunt [name] [phone] to confirm that she will transport patient

## 2024-12-12 NOTE — Group Note (Signed)
 Group Topic: Fears and Unhealthy Coping Skills  Group Date: 12/12/2024 Start Time: 1100 End Time: 1200 Facilitators: Rexton Greulich, LCSW  Department: [facility]  Number of Participants: 5  Group Focus: acceptance, chemical dependency issues, feeling awareness/expression, forgiveness, self-esteem, and substance abuse education Treatment Modality:  Cognitive Behavioral Therapy Interventions utilized were confrontation, exploration, group exercise, and patient education Purpose: express feelings, express irrational fears, improve communication skills, regain self-worth, relapse prevention strategies, and trigger / craving management  Writer explored with each group member guilt and shame related to substance use with family and friends.  Writer utilized CBT to explore their own feelings of shame and guilt and if it's creating avoidance/hindrance with sobriety/relationships.    Name: [name] [name] Date of Birth: [birth year]  MR: [id number]    Level of Participation: Client did not attend group.  Client will be encouraged to attend.  Quality of Participation:  NA Interactions with others:  NA Mood/Affect: NA Triggers (if applicable): NA Cognition: NA Progress: NA Response: NA Plan: Client will be encouraged to attend group  Patients Problems:  Patient Active Problem List   Diagnosis Date Noted   Malnutrition of moderate degree 11/04/2023   Acute stress disorder 11/04/2023   Status post surgery 10/08/2023   Alcohol  use disorder, severe, dependence (HCC) 11/11/2022   Alcohol  use disorder, severe, in early remission (HCC) 11/10/2022   Alcoholic peripheral neuropathy 11/10/2022   Alcohol  abuse with intoxication 03/27/2022   Neuropathy 12/30/2021   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) 12/27/2021   Uncomplicated alcohol  dependence (HCC) 06/16/2021   Essential hypertension 04/07/2021   Prediabetes 04/07/2021   Other hyperlipidemia  04/07/2021   Major depressive disorder, severe (HCC) 03/16/2021   History of hyperlipidemia 12/21/2020   Seborrheic dermatitis of scalp 12/21/2020   Substance abuse (HCC) 12/21/2020   Hyperopia of both eyes with astigmatism and presbyopia 12/20/2020   Posterior vitreous detachment of right eye 12/20/2020   MDD (major depressive disorder), recurrent episode, severe (HCC) 06/30/2017   Lateral epicondylitis of right elbow 01/05/2017   Strain, MCP, hand, right, initial encounter 01/05/2017   Vasomotor rhinitis 02/13/2016   Bilateral chronic knee pain 01/11/2016   Chronic pain of right wrist 07/07/2015   Carpal tunnel syndrome of right wrist 05/20/2015   Dislocation of right shoulder joint 05/20/2015   Tear of medial meniscus of right knee 03/24/2015   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HCC) 01/27/2015   Chronic cough 01/15/2015   Onychomycosis of toenail 12/15/2014   Dermatitis of face 11/11/2014   GERD (gastroesophageal reflux disease)    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

## 2024-12-12 NOTE — ED Notes (Signed)
 Pt received at the beginning of shift awake, in no apparent distress without complaints.  Safety maintained.

## 2024-12-12 NOTE — Group Note (Signed)
 Group Topic: Communication  Group Date: 12/12/2024 Start Time: 1200 End Time: 1230 Facilitators: Reinhold Minor BROCKS, RN  Department: [facility]  Number of Participants: 8  Group Focus: communication Treatment Modality:  Psychoeducation Interventions utilized were support Purpose: improve communication skills  Name: [name] [name] Date of Birth: 05/21/1961  MR: [id number]    Level of Participation: minimal Quality of Participation: cooperative Interactions with others: gave feedback Mood/Affect: positive Triggers (if applicable): na Cognition: coherent/clear Progress: Minimal Response: will continue to communicate clearly Plan: patient will be encouraged to communicate  Patients Problems:  Patient Active Problem List   Diagnosis Date Noted   Malnutrition of moderate degree 11/04/2023   Acute stress disorder 11/04/2023   Status post surgery 10/08/2023   Alcohol  use disorder, severe, dependence (HCC) 11/11/2022   Alcohol  use disorder, severe, in early remission (HCC) 11/10/2022   Alcoholic peripheral neuropathy 11/10/2022   Alcohol  abuse with intoxication 03/27/2022   Neuropathy 12/30/2021   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) 12/27/2021   Uncomplicated alcohol  dependence (HCC) 06/16/2021   Essential hypertension 04/07/2021   Prediabetes 04/07/2021   Other hyperlipidemia 04/07/2021   Major depressive disorder, severe (HCC) 03/16/2021   History of hyperlipidemia 12/21/2020   Seborrheic dermatitis of scalp 12/21/2020   Substance abuse (HCC) 12/21/2020   Hyperopia of both eyes with astigmatism and presbyopia 12/20/2020   Posterior vitreous detachment of right eye 12/20/2020   MDD (major depressive disorder), recurrent episode, severe (HCC) 06/30/2017   Lateral epicondylitis of right elbow 01/05/2017   Strain, MCP, hand, right, initial encounter 01/05/2017   Vasomotor rhinitis 02/13/2016   Bilateral chronic knee pain  01/11/2016   Chronic pain of right wrist 07/07/2015   Carpal tunnel syndrome of right wrist 05/20/2015   Dislocation of right shoulder joint 05/20/2015   Tear of medial meniscus of right knee 03/24/2015   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HCC) 01/27/2015   Chronic cough 01/15/2015   Onychomycosis of toenail 12/15/2014   Dermatitis of face 11/11/2014   GERD (gastroesophageal reflux disease)    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

## 2024-12-12 NOTE — ED Notes (Signed)
 Pt compliant with HS medications.  Medication education provided pt verbalizes with understanding.  Pleasant with flat affect maintained on CIWA . Score 0 Pt resting at this time, appears to be sleeping safety maintained.

## 2024-12-12 NOTE — [facility] Group Notes (Signed)
 Spiritual Care and Counseling Group Note  12/12/2024 2:45pm  Facilitated by: Librada Donnice Lin   Type of Therapy and Topic:  Hope    Participation Level:  Active  Description of Group:  Group focused on topic of hope.  Patients participated in facilitated discussion around topic, connecting with one another around experiences and definitions for hope.  Group members engaged with group word cloud.  Members selected an image of what hope looks like for them today.  Group engaged in discussion around how their definitions of hope are present today in hospital.    Summary of Patient Progress:  [name] [name] was present throughout group and actively engaged with peers and facilitator   Therapeutic Modalities: Psycho-social ed, Adlerian, Narrative, MI   Librada Donnice Lin, Chaplain 12/12/2024 4:37 PM

## 2024-12-12 NOTE — ED Notes (Signed)
 Patient asleep in the bedroom. NAD

## 2024-12-12 NOTE — ED Provider Notes (Signed)
 Facility Based Crisis [facility]) Progress Note  Date and Time: 12/12/2024 12:55 PM Name: [name] [name] MRN:  [id number]  Subjective:  Alcoholism   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)  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)  Alcohol  use disorder, severe, dependence (HCC)  GAD (generalized anxiety disorder)  Elevated liver enzymes   Chart reviewed with attending psychiatrist, Dr Kandi Hahn  Pt is seen face-to-face on the Facility Based Crisis [facility]) unit. Pt is alert & oriented x 4 and engages in evaluation. Today, pt states he came in for detox treatment due to alcoholism. States he has been experiencing stress, panic, and anxiety since I was younger but I didn't know what it was so I just drank to self-medicate. States he drinks four to five 24 oz beers a day with last use right before I came here. ETOH level on 12/10/2024 was less than 15. States he has never had a seizure from discontinuing alcohol  use and has never been hospitalized due to complications related to alcohol  use. States his longest period of abstinence from alcohol  was 6 months. States he has been to detox several times however is unable to recall the last time he was in detox.  States in the past he has had a medication for cravings and states that this medication helped for a while; however, he is unable to recall the name of this medication.  He has been appropriate on milieu and attending groups. He denies cravings for alcohol .  He is currently on CIWA protocol with as needed Librium .  CIWA score was 3 today at 0640.  He states sleep is good and appetite in improving. He denies suicidal or homicidal ideation, intent, or plan.  Labs: ALT AST elevated on admission on 12/10/2024.  Hepatic function panel repeated today; AST 73 trending down from 192 on 12/10/2024.  ALT 88 trending down from 149 on 12/10/2024.  Repeat CBC shows low platelets at 99 however  improved from 84 on 12/10/2024.  Patient has met with care management to discuss discharge planning.  At this time patient is tentatively scheduled for discharge on Monday with outpatient follow-up scheduled with [facility] on 12/24/2024.  Total Time spent with patient: 15 minutes  Past Psychiatric History: Alcohol  use disorder, generalized anxiety disorder, major depressive disorder Past Medical History: Bilateral knee osteoarthritis, carpal tunnel syndrome (right wrist), cervical spinal stenosis, hypertension, GERD, history of alcoholic hepatitis, history of CVA (05/2021), history of pancreatitis, hyperlipidemia, sarcoidosis of lung Family History: Father: DM and HTN; paternal grandfather: HTN Family Psychiatric  History: Paternal grandfather: Alcohol  abuse; maternal uncles: Alcohol  use; paternal uncles alcohol  use; paternal aunt: Alcohol  use Social History: Single.  Lives alone in his own housing.  Has 2 children.   Sleep: Good  Appetite:  improving  Current Medications:  Current Facility-Administered Medications  Medication Dose Route Frequency Provider Last Rate Last Admin   alum & mag hydroxide-simeth (MAALOX/MYLANTA) 200-200-20 MG/5ML suspension 30 mL  30 mL Oral Q4H PRN Onuoha, Chinwendu V, NP       chlordiazePOXIDE  (LIBRIUM ) capsule 25 mg  25 mg Oral Q6H PRN Onuoha, Chinwendu V, NP   25 mg at 12/10/24 1306   feeding supplement (ENSURE PLUS HIGH PROTEIN) liquid 237 mL  237 mL Oral BID BM Ramzan, Mariam, NP   237 mL at 12/12/24 0925   gabapentin  (NEURONTIN ) capsule 200 mg  200 mg Oral TID Ramzan, Mariam, NP   200 mg at 12/12/24 0923   hydrOXYzine  (ATARAX ) tablet  25 mg  25 mg Oral TID PRN Onuoha, Chinwendu V, NP   25 mg at 12/12/24 9075   loperamide  (IMODIUM ) capsule 2-4 mg  2-4 mg Oral PRN Onuoha, Chinwendu V, NP       LORazepam  (ATIVAN ) injection 2 mg  2 mg Intramuscular Once Nkwenti, Doris, NP       magnesium  hydroxide (MILK OF MAGNESIA) suspension 15 mL  15 mL Oral Daily PRN Onuoha,  Chinwendu V, NP       melatonin tablet 5 mg  5 mg Oral QHS Nkwenti, Doris, NP   5 mg at 12/11/24 2114   multivitamin with minerals tablet 1 tablet  1 tablet Oral Daily Onuoha, Chinwendu V, NP   1 tablet at 12/12/24 9075   OLANZapine  zydis (ZYPREXA ) disintegrating tablet 5 mg  5 mg Oral TID PRN Ramzan, Mariam, NP       ondansetron  (ZOFRAN -ODT) disintegrating tablet 4 mg  4 mg Oral Q6H PRN Onuoha, Chinwendu V, NP       sertraline  (ZOLOFT ) tablet 50 mg  50 mg Oral Daily Nkwenti, Doris, NP   50 mg at 12/12/24 9077   thiamine  (VITAMIN B1) tablet 100 mg  100 mg Oral Daily Ramzan, Mariam, NP   100 mg at 12/12/24 9077   Current Outpatient Medications  Medication Sig Dispense Refill   escitalopram  (LEXAPRO ) 20 MG tablet Take 1.5 tablets (30 mg total) by mouth daily. 90 tablet 1   gabapentin  (NEURONTIN ) 300 MG capsule Take 300 mg by mouth 3 (three) times daily.      Labs  Lab Results:  Admission on 12/10/2024  Component Date Value Ref Range Status   WBC 12/10/2024 3.9 (L)  4.0 - 10.5 K/uL Final   RBC 12/10/2024 4.83  4.22 - 5.81 MIL/uL Final   Hemoglobin 12/10/2024 13.7  13.0 - 17.0 g/dL Final   HCT 98/85/7973 40.8  39.0 - 52.0 % Final   MCV 12/10/2024 84.5  80.0 - 100.0 fL Final   MCH 12/10/2024 28.4  26.0 - 34.0 pg Final   MCHC 12/10/2024 33.6  30.0 - 36.0 g/dL Final   RDW 98/85/7973 14.6  11.5 - 15.5 % Final   Platelets 12/10/2024 84 (L)  150 - 400 K/uL Final   Comment: Immature Platelet Fraction may be clinically indicated, consider ordering this additional test [id number]    nRBC 12/10/2024 0.0  0.0 - 0.2 % Final   Neutrophils Relative % 12/10/2024 71  % Final   Neutro Abs 12/10/2024 2.8  1.7 - 7.7 K/uL Final   Lymphocytes Relative 12/10/2024 15  % Final   Lymphs Abs 12/10/2024 0.6 (L)  0.7 - 4.0 K/uL Final   Monocytes Relative 12/10/2024 11  % Final   Monocytes Absolute 12/10/2024 0.4  0.1 - 1.0 K/uL Final   Eosinophils Relative 12/10/2024 1  % Final   Eosinophils Absolute  12/10/2024 0.0  0.0 - 0.5 K/uL Final   Basophils Relative 12/10/2024 1  % Final   Basophils Absolute 12/10/2024 0.0  0.0 - 0.1 K/uL Final   WBC Morphology 12/10/2024 MORPHOLOGY UNREMARKABLE   Final   RBC Morphology 12/10/2024 MORPHOLOGY UNREMARKABLE   Final   Smear Review 12/10/2024 See Note   Final   PLATELETS APPEAR DECREASED   Immature Granulocytes 12/10/2024 1  % Final   Abs Immature Granulocytes 12/10/2024 0.02  0.00 - 0.07 K/uL Final   Performed at [facility], [address], [place], [place] [address]   Sodium 12/10/2024 134 (L)  135 -  145 mmol/L Final   Potassium 12/10/2024 4.0  3.5 - 5.1 mmol/L Final   Chloride 12/10/2024 93 (L)  98 - 111 mmol/L Final   CO2 12/10/2024 27  22 - 32 mmol/L Final   Glucose, Bld 12/10/2024 84  70 - 99 mg/dL Final   Glucose reference range applies only to samples taken after fasting for at least 8 hours.   BUN 12/10/2024 6 (L)  8 - 23 mg/dL Final   Creatinine, Ser 12/10/2024 0.96  0.61 - 1.24 mg/dL Final   Calcium  12/10/2024 9.5  8.9 - 10.3 mg/dL Final   Total Protein 98/85/7973 8.0  6.5 - 8.1 g/dL Final   Albumin  12/10/2024 4.7  3.5 - 5.0 g/dL Final   AST 98/85/7973 192 (H)  15 - 41 U/L Final   ALT 12/10/2024 149 (H)  0 - 44 U/L Final   Alkaline Phosphatase 12/10/2024 53  38 - 126 U/L Final   Total Bilirubin 12/10/2024 1.3 (H)  0.0 - 1.2 mg/dL Final   GFR, Estimated 12/10/2024 >60  >60 mL/min Final   Comment: (NOTE) Calculated using the CKD-EPI Creatinine Equation (2021)    Anion gap 12/10/2024 15  5 - 15 Final   Performed at [facility], [address], [place], [place] [address]   Alcohol , Ethyl (B) 12/10/2024 <15  <15 mg/dL Final   Comment: (NOTE) For medical purposes only. Performed at [facility], [address], [place], [place] [address]    Cholesterol 12/10/2024 335 (H)  0 - 200 mg/dL Final   Comment:        ATP III CLASSIFICATION:  <200     mg/dL   Desirable  799-760  mg/dL   Borderline High  >=759    mg/dL    High           Triglycerides 12/10/2024 114  <150 mg/dL Final   HDL 98/85/7973 >135  >40 mg/dL Final   Total CHOL/HDL Ratio 12/10/2024 NOT CALCULATED  RATIO Final   VLDL 12/10/2024 23  0 - 40 mg/dL Final   LDL Cholesterol 12/10/2024 NOT CALCULATED  0 - 99 mg/dL Final   Comment:        Total Cholesterol/HDL:CHD Risk Coronary Heart Disease Risk Table                     Men   Women  1/2 Average Risk   3.4   3.3  Average Risk       5.0   4.4  2 X Average Risk   9.6   7.1  3 X Average Risk  23.4   11.0        Use the calculated Patient Ratio above and the CHD Risk Table to determine the patient's CHD Risk.        ATP III CLASSIFICATION (LDL):  <100     mg/dL   Optimal  899-870  mg/dL   Near or Above                    Optimal  130-159  mg/dL   Borderline  839-810  mg/dL   High  >809     mg/dL   Very High Performed at [facility], [address], [place], [place] [address]    TSH 12/10/2024 1.210  0.350 - 4.500 uIU/mL Final   Performed at [facility], [address], [place], [place] [address]   POC Amphetamine UR 12/10/2024 None  Detected  NONE DETECTED (Cut Off Level 1000 ng/mL) Final   POC Secobarbital (BAR) 12/10/2024 None Detected  NONE DETECTED (Cut Off Level 300 ng/mL) Final   POC Buprenorphine (BUP) 12/10/2024 None Detected  NONE DETECTED (Cut Off Level 10 ng/mL) Final   POC Oxazepam  (BZO) 12/10/2024 Positive (A)  NONE DETECTED (Cut Off Level 300 ng/mL) Corrected   POC Cocaine UR 12/10/2024 None Detected  NONE DETECTED (Cut Off Level 300 ng/mL) Final   POC Methamphetamine UR 12/10/2024 None Detected  NONE DETECTED (Cut Off Level 1000 ng/mL) Final   POC Morphine  12/10/2024 None Detected  NONE DETECTED (Cut Off Level 300 ng/mL) Final   POC Methadone UR 12/10/2024 None Detected  NONE DETECTED (Cut Off Level 300 ng/mL) Final   POC Oxycodone  UR 12/10/2024 None Detected  NONE DETECTED (Cut Off Level 100 ng/mL) Final   POC Marijuana UR 12/10/2024 None Detected   NONE DETECTED (Cut Off Level 50 ng/mL) Final   WBC 12/12/2024 5.4  4.0 - 10.5 K/uL Final   RBC 12/12/2024 4.38  4.22 - 5.81 MIL/uL Final   Hemoglobin 12/12/2024 12.6 (L)  13.0 - 17.0 g/dL Final   HCT 98/83/7973 38.4 (L)  39.0 - 52.0 % Final   MCV 12/12/2024 87.7  80.0 - 100.0 fL Final   MCH 12/12/2024 28.8  26.0 - 34.0 pg Final   MCHC 12/12/2024 32.8  30.0 - 36.0 g/dL Final   RDW 98/83/7973 14.7  11.5 - 15.5 % Final   Platelets 12/12/2024 99 (L)  150 - 400 K/uL Final   Comment: REPEATED TO VERIFY Immature Platelet Fraction may be clinically indicated, consider ordering this additional test [id number]    nRBC 12/12/2024 0.0  0.0 - 0.2 % Final   Performed at [facility], [address] [place], [place] [address]   Total Protein 12/12/2024 7.8  6.5 - 8.1 g/dL Final   Albumin  12/12/2024 4.4  3.5 - 5.0 g/dL Final   AST 98/83/7973 73 (H)  15 - 41 U/L Final   ALT 12/12/2024 88 (H)  0 - 44 U/L Final   Alkaline Phosphatase 12/12/2024 52  38 - 126 U/L Final   Total Bilirubin 12/12/2024 0.7  0.0 - 1.2 mg/dL Final   Bilirubin, Direct 12/12/2024 0.3 (H)  0.0 - 0.2 mg/dL Final   Indirect Bilirubin 12/12/2024 0.4  0.3 - 0.9 mg/dL Final   Performed at [facility], [address] [place], [place] [address]    Blood Alcohol  level:  Lab Results  Component Value Date   ETH <15 12/10/2024   ETH 320 (HH) 07/19/2023    Metabolic Disorder Labs: Lab Results  Component Value Date   HGBA1C 5.7 (H) 01/21/2024   MPG 117 01/21/2024   MPG 126 11/12/2022   No results found for: PROLACTIN Lab Results  Component Value Date   CHOL 335 (H) 12/10/2024   TRIG 114 12/10/2024   HDL >135 12/10/2024   CHOLHDL NOT CALCULATED 12/10/2024   VLDL 23 12/10/2024   LDLCALC NOT CALCULATED 12/10/2024   LDLCALC NOT CALCULATED 11/10/2022    Therapeutic Lab Levels: No results found for: LITHIUM No results found for: VALPROATE No results found for: CBMZ  Physical Findings   AIMS     Flowsheet Row [facility]) [facility] (Discharged) [facility] Ad[facility] (Discharged) [facility]  AIMS Total Score 0 0 0  AUDIT    Flowsheet Row Ad[facility] ([facility]) [facility] Admission (Discharged) from [facility] Admission (Discharged) from [facility] Admission (Discharged) from [facility]06[facility]2014 [facility] Admission (Discharged) from [facility]  Alcohol  Use Disorder Identification Test Final Score (AUDIT) 40 34 35 26 23   GAD-7    Flowsheet Row Office Visit from 04/15/2024 in [facility] Office Visit from 03/28/2024 in [facility] [place] - A Dept Of [place]. [facility] Office Visit from 02/15/2024 in [facility] - A Dept Of [place]. [facility] Office Visit from 05/10/2023 in [facility] [place] - A Dept Of [place]. [facility] Office Visit from 02/21/2023 in [facility] - A Dept Of [place] DEL. [facility]  Total GAD-7 Score 11 10 11 14 11    PHQ2-9    Flowsheet Row ED from 12/10/2024 in [facility] Office Visit from 04/15/2024 in [facility] Office Visit from 03/28/2024 in [facility] [place] - A Dept Of [place]. [facility] Office Visit from 02/15/2024 in [facility] [place] - A Dept Of [place] DEL. [facility] Office Visit from 05/10/2023 in [facility] [place] - A Dept Of [place]. [facility]  PHQ-2 Total Score 6 1 2 3 6   PHQ-9 Total Score 24 -- 7 7 20     Flowsheet Row ED from 12/10/2024 in [facility] ED from [facility]/09/2024 [facility] Office Visit from 04/15/2024 in [facility]  C-SSRS RISK CATEGORY No Risk No Risk No Risk     Musculoskeletal  Strength & Muscle Tone: within normal limits Gait & Station: normal Patient leans: N/A  Psychiatric Specialty Exam  Presentation  General Appearance:  Appropriate for Environment; Fairly Groomed  Eye Contact: Good  Speech: Clear and Coherent; Normal Rate  Speech Volume: Normal  Handedness: Right   Mood and Affect  Mood: Euthymic  Affect: Appropriate   Thought Process  Thought Processes: Coherent  Descriptions of Associations:Intact  Orientation:Full (Time, Place and Person)  Thought Content:Logical  Diagnosis of Schizophrenia or Schizoaffective disorder in past: No    Hallucinations:Hallucinations: None  Ideas of Reference:None  Suicidal Thoughts:Suicidal Thoughts: No  Homicidal Thoughts:Homicidal Thoughts: No   Sensorium  Memory: Immediate Good; Recent Good  Judgment: Good  Insight: Good   Executive Functions  Concentration: Good  Attention Span: Good  Recall: Good  Fund of Knowledge: Good  Language: Good   Psychomotor Activity  Psychomotor Activity: Psychomotor Activity: Normal   Assets  Assets: Communication Skills; Desire for Improvement; Resilience   Sleep  Sleep: Sleep: Fair  Estimated Sleeping Duration (Last 24 Hours): 9.50-11.00 hours  Nutritional Assessment (For OBS and FBC admissions only) Has the patient had a weight loss or gain of 10 pounds or more in the last 3 months?: No Has the patient had a decrease in food intake/or appetite?: Yes Does the patient have dental problems?: No Does the patient have eating habits or behaviors that may be indicators of an eating disorder including binging or inducing vomiting?: No Has the  patient recently lost weight without trying?: 0 Has the patient been eating poorly because of a decreased appetite?: 1  Malnutrition Screening Tool Score: 1    Physical Exam  Physical Exam Vitals and nursing note reviewed.  HENT:     Head: Normocephalic.     Mouth/Throat:     Mouth: Mucous membranes are moist.  Cardiovascular:     Rate and Rhythm: Normal rate.  Pulmonary:     Effort: Pulmonary effort is normal.  Musculoskeletal:        General: Normal range of motion.  Skin:    General: Skin is warm and dry.  Neurological:     Mental Status: He is oriented to person, place, and time.  Psychiatric:     Comments: See HPI    Review of Systems  Constitutional:  Negative for chills and fever.  HENT:  Negative for congestion and sore throat.   Respiratory:  Negative for cough and shortness of breath.   Cardiovascular:  Negative for chest pain and palpitations.  Gastrointestinal:  Negative for diarrhea, nausea and vomiting.  Psychiatric/Behavioral:  Positive for substance abuse. Negative for hallucinations and suicidal ideas. The patient is not nervous/anxious.    Blood pressure 110/74, pulse 80, temperature 97.8 F (36.6 C), temperature source Oral, resp. rate 16, SpO2 92%. There is no height or weight on file to calculate BMI.  Treatment Plan Summary: Daily contact with patient to assess and evaluate symptoms and progress in treatment Bleeding precautions due to low platelet count of 99  Medication management Continue agitation protocol Maalox/Mylanta 30 mL p.o. every 4 hours as needed indigestion Continue CIWA assessment with Librium  25 mg p.o. every 6 hours as needed CIWA score greater than 10 Continue Ensure high-protein p.o. twice daily between meals Continue gabapentin  200 mg p.o. 3 times daily Continue hydroxyzine  25 mg p.o. 3 times daily as needed anxiety Continue Imodium  2 to 4 mg p.o. as needed diarrhea or loose stools Continue MOM 15 mL p.o. daily as needed mild  constipation Multivitamin 1 tablet p.o. daily Ondansetron  ODT 4 mg p.o. every 6 hours as needed nausea vomiting Continue Zoloft  50 mg p.o. daily Continue thiamine  100 mg p.o. daily   Plan Care management to assist with discharge planning. Tentative discharge for Monday, 12/15/2024.  Sherrell Culver, PMHNP-BC, FNP-BC  12/12/2024 4:34 PM

## 2024-12-12 NOTE — ED Notes (Signed)
 Patient denies pain and is resting comfortably.

## 2024-12-12 NOTE — ED Notes (Signed)
 Patient alert & oriented x4. Pt on unit throughout shift q15 checks completed. Pt denies intent to harm self or others. Denies AVH. No signs of acute distress noted. No inappropriate behaviors observed or reported. Encouraged patient to notify staff if any thoughts of harm towards self or others arise. Patient verbalizes understanding and agreement. Eating and fluid intake were adequate. Medications administered as ordered; no adverse effects noted.

## 2024-12-13 DIAGNOSIS — R748 Abnormal levels of other serum enzymes: Secondary | ICD-10-CM | POA: Diagnosis not present

## 2024-12-13 DIAGNOSIS — Z79899 Other long term (current) drug therapy: Secondary | ICD-10-CM | POA: Diagnosis not present

## 2024-12-13 DIAGNOSIS — F411 Generalized anxiety disorder: Secondary | ICD-10-CM | POA: Diagnosis not present

## 2024-12-13 DIAGNOSIS — F1024 Alcohol dependence with alcohol-induced mood disorder: Secondary | ICD-10-CM | POA: Diagnosis not present

## 2024-12-13 MED ORDER — NALTREXONE HCL 50 MG PO TABS
25.0000 mg | ORAL_TABLET | Freq: Every day | ORAL | Status: DC
  Administered 2024-12-13 – 2024-12-15 (×3): 25 mg via ORAL
  Filled 2024-12-13: qty 4
  Filled 2024-12-13: qty 7
  Filled 2024-12-13 (×3): qty 1

## 2024-12-13 MED ORDER — HYDROXYZINE HCL 10 MG PO TABS
10.0000 mg | ORAL_TABLET | Freq: Three times a day (TID) | ORAL | Status: DC | PRN
  Administered 2024-12-13 – 2024-12-14 (×2): 10 mg via ORAL
  Filled 2024-12-13 (×2): qty 1

## 2024-12-13 NOTE — ED Notes (Signed)
 Patient watching TV. Calm, coperative, no physical complaints at this time. Patient in no apparent acute distress. No inappropriate behaviors observed or reported at this time. Environment secured. Safety checks in place per facility protocol. Meds administered per providers orders.

## 2024-12-13 NOTE — Group Note (Signed)
 Group Topic: Positive Affirmations  Group Date: 12/13/2024 Start Time: 1200 End Time: 1230 Facilitators: Reinhold Minor BROCKS, RN  Department: [facility]  Number of Participants: 7  Group Focus: coping skills Treatment Modality:  Psychoeducation Interventions utilized were patient education Purpose: enhance coping skills  Name: [name] [name] Date of Birth: 05-04-1961  MR: [id number]    Level of Participation: minimal Quality of Participation: cooperative Interactions with others: gave feedback Mood/Affect: positive Triggers (if applicable): na Cognition: goal directed Progress: Moderate Response: na Plan: patient will be encouraged to practice coping skills  Patients Problems:  Patient Active Problem List   Diagnosis Date Noted   Malnutrition of moderate degree 11/04/2023   Acute stress disorder 11/04/2023   Status post surgery 10/08/2023   Alcohol  use disorder, severe, dependence (HCC) 11/11/2022   Alcohol  use disorder, severe, in early remission (HCC) 11/10/2022   Alcoholic peripheral neuropathy 11/10/2022   Alcohol  abuse with intoxication 03/27/2022   Neuropathy 12/30/2021   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) 12/27/2021   Uncomplicated alcohol  dependence (HCC) 06/16/2021   Essential hypertension 04/07/2021   Prediabetes 04/07/2021   Other hyperlipidemia 04/07/2021   Major depressive disorder, severe (HCC) 03/16/2021   History of hyperlipidemia 12/21/2020   Seborrheic dermatitis of scalp 12/21/2020   Substance abuse (HCC) 12/21/2020   Hyperopia of both eyes with astigmatism and presbyopia 12/20/2020   Posterior vitreous detachment of right eye 12/20/2020   MDD (major depressive disorder), recurrent episode, severe (HCC) 06/30/2017   Lateral epicondylitis of right elbow 01/05/2017   Strain, MCP, hand, right, initial encounter 01/05/2017   Vasomotor rhinitis 02/13/2016   Bilateral chronic knee pain 01/11/2016    Chronic pain of right wrist 07/07/2015   Carpal tunnel syndrome of right wrist 05/20/2015   Dislocation of right shoulder joint 05/20/2015   Tear of medial meniscus of right knee 03/24/2015   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HCC) 01/27/2015   Chronic cough 01/15/2015   Onychomycosis of toenail 12/15/2014   Dermatitis of face 11/11/2014   GERD (gastroesophageal reflux disease)    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

## 2024-12-13 NOTE — Group Note (Signed)
 Group Topic: Emotional Regulation  Group Date: 12/13/2024 Start Time: 2000 End Time: 2030 Facilitators: Verdon Jacqualyn BRAVO, NT  Department: [facility]  Number of Participants: 8  Group Focus: daily focus Treatment Modality:  Individual Therapy Interventions utilized were group exercise Purpose: express feelings  Name: [name] [name] Date of Birth: [birth year]  MR: [id number]    Level of Participation: active Quality of Participation: cooperative Interactions with others: gave feedback Mood/Affect: appropriate Triggers (if applicable): n/a Cognition: coherent/clear Progress: Moderate Response: n/a Plan: follow-up needed  Patients Problems:  Patient Active Problem List   Diagnosis Date Noted   Malnutrition of moderate degree 11/04/2023   Acute stress disorder 11/04/2023   Status post surgery 10/08/2023   Alcohol  use disorder, severe, dependence (HCC) 11/11/2022   Alcohol  use disorder, severe, in early remission (HCC) 11/10/2022   Alcoholic peripheral neuropathy 11/10/2022   Alcohol  abuse with intoxication 03/27/2022   Neuropathy 12/30/2021   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) 12/27/2021   Uncomplicated alcohol  dependence (HCC) 06/16/2021   Essential hypertension 04/07/2021   Prediabetes 04/07/2021   Other hyperlipidemia 04/07/2021   Major depressive disorder, severe (HCC) 03/16/2021   History of hyperlipidemia 12/21/2020   Seborrheic dermatitis of scalp 12/21/2020   Substance abuse (HCC) 12/21/2020   Hyperopia of both eyes with astigmatism and presbyopia 12/20/2020   Posterior vitreous detachment of right eye 12/20/2020   MDD (major depressive disorder), recurrent episode, severe (HCC) 06/30/2017   Lateral epicondylitis of right elbow 01/05/2017   Strain, MCP, hand, right, initial encounter 01/05/2017   Vasomotor rhinitis 02/13/2016   Bilateral chronic knee pain 01/11/2016   Chronic pain of right wrist 07/07/2015    Carpal tunnel syndrome of right wrist 05/20/2015   Dislocation of right shoulder joint 05/20/2015   Tear of medial meniscus of right knee 03/24/2015   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HCC) 01/27/2015   Chronic cough 01/15/2015   Onychomycosis of toenail 12/15/2014   Dermatitis of face 11/11/2014   GERD (gastroesophageal reflux disease)    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

## 2024-12-13 NOTE — Group Note (Signed)
 Group Topic: Relapse and Recovery  Group Date: 12/13/2024 Start Time: 1300 End Time: 1400 Facilitators: Clodfelter-Simmons, Laiken Sandy L, NT  Department: [facility]  Number of Participants: 8  Group Focus: relapse prevention Treatment Modality:  Psychoeducation Interventions utilized were problem solving Purpose: relapse prevention strategies  Name: [name] [name] Date of Birth: 1961/11/09  MR: [id number]    Level of Participation: did not attend Quality of Participation:  Interactions with others:  Mood/Affect:  Triggers (if applicable):  Cognition:  Progress: Other Response:  Plan: patient will be encouraged to attend groups  Patients Problems:  Patient Active Problem List   Diagnosis Date Noted   Malnutrition of moderate degree 11/04/2023   Acute stress disorder 11/04/2023   Status post surgery 10/08/2023   Alcohol  use disorder, severe, dependence (HCC) 11/11/2022   Alcohol  use disorder, severe, in early remission (HCC) 11/10/2022   Alcoholic peripheral neuropathy 11/10/2022   Alcohol  abuse with intoxication 03/27/2022   Neuropathy 12/30/2021   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) 12/27/2021   Uncomplicated alcohol  dependence (HCC) 06/16/2021   Essential hypertension 04/07/2021   Prediabetes 04/07/2021   Other hyperlipidemia 04/07/2021   Major depressive disorder, severe (HCC) 03/16/2021   History of hyperlipidemia 12/21/2020   Seborrheic dermatitis of scalp 12/21/2020   Substance abuse (HCC) 12/21/2020   Hyperopia of both eyes with astigmatism and presbyopia 12/20/2020   Posterior vitreous detachment of right eye 12/20/2020   MDD (major depressive disorder), recurrent episode, severe (HCC) 06/30/2017   Lateral epicondylitis of right elbow 01/05/2017   Strain, MCP, hand, right, initial encounter 01/05/2017   Vasomotor rhinitis 02/13/2016   Bilateral chronic knee pain 01/11/2016   Chronic pain of right wrist 07/07/2015    Carpal tunnel syndrome of right wrist 05/20/2015   Dislocation of right shoulder joint 05/20/2015   Tear of medial meniscus of right knee 03/24/2015   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HCC) 01/27/2015   Chronic cough 01/15/2015   Onychomycosis of toenail 12/15/2014   Dermatitis of face 11/11/2014   GERD (gastroesophageal reflux disease)    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

## 2024-12-13 NOTE — ED Notes (Signed)
 Pt received at the beginning of the shift, seated in dayroom with peers, socializing appropriately, pleasant mood.  Safety maintained.

## 2024-12-13 NOTE — Group Note (Signed)
 Group Topic: Emotional Regulation  Group Date: 12/13/2024 Start Time: 1900 End Time: 1930 Facilitators: Verdon Jacqualyn BRAVO, NT  Department: [facility]  Number of Participants: 10  Group Focus: anxiety Treatment Modality:  Individual Therapy Interventions utilized were group exercise Purpose: express feelings  Name: [name] [name] Date of Birth: 03-30-1961  MR: [id number]    Level of Participation: active Quality of Participation: cooperative Interactions with others: gave feedback Mood/Affect: appropriate Triggers (if applicable)n/a Cognition: coherent/clear Progress: Moderate Response: n/a Plan: follow-up needed  Patients Problems:  Patient Active Problem List   Diagnosis Date Noted   Malnutrition of moderate degree 11/04/2023   Acute stress disorder 11/04/2023   Status post surgery 10/08/2023   Alcohol  use disorder, severe, dependence (HCC) 11/11/2022   Alcohol  use disorder, severe, in early remission (HCC) 11/10/2022   Alcoholic peripheral neuropathy 11/10/2022   Alcohol  abuse with intoxication 03/27/2022   Neuropathy 12/30/2021   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) 12/27/2021   Uncomplicated alcohol  dependence (HCC) 06/16/2021   Essential hypertension 04/07/2021   Prediabetes 04/07/2021   Other hyperlipidemia 04/07/2021   Major depressive disorder, severe (HCC) 03/16/2021   History of hyperlipidemia 12/21/2020   Seborrheic dermatitis of scalp 12/21/2020   Substance abuse (HCC) 12/21/2020   Hyperopia of both eyes with astigmatism and presbyopia 12/20/2020   Posterior vitreous detachment of right eye 12/20/2020   MDD (major depressive disorder), recurrent episode, severe (HCC) 06/30/2017   Lateral epicondylitis of right elbow 01/05/2017   Strain, MCP, hand, right, initial encounter 01/05/2017   Vasomotor rhinitis 02/13/2016   Bilateral chronic knee pain 01/11/2016   Chronic pain of right wrist 07/07/2015   Carpal  tunnel syndrome of right wrist 05/20/2015   Dislocation of right shoulder joint 05/20/2015   Tear of medial meniscus of right knee 03/24/2015   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HCC) 01/27/2015   Chronic cough 01/15/2015   Onychomycosis of toenail 12/15/2014   Dermatitis of face 11/11/2014   GERD (gastroesophageal reflux disease)    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

## 2024-12-13 NOTE — ED Notes (Signed)
 Pt resting quietly through night in no apparent distress.  Safety maintained.

## 2024-12-13 NOTE — ED Provider Notes (Addendum)
 Behavioral Health Progress Note  Date and Time: 12/13/2024 9:00 AM Name: Elishah Ashmore MRN:  [id number]  HPI: Selma Rodelo is a 64 year old AAM with a mental health history of alcohol  use d/o anxiety, depression and medical history of hypertension, who presented voluntarily as a walk-in earlier today morning (12/10/24) to [facility] seeking detox from alcohol . Patient was admitted to the Facility [facility] with a goal of detoxing.  Subjective:  Patient seen face-to-face by this provider, consulted with Dr. Cole and chart reviewed on 12/13/2024. On evaluation, patient is alert and oriented x 4. He is calm and cooperative during his assessment. Reports that his energy and sleep is good. Rates depression a 0 and anxiety a 6 on a scale of 0-10 (10 being the worst). He scored a 2 on his PHQ-9 screening assessment today. Denies any alcohol  related withdrawal symptoms. Patient has a mild tremor when his hands are extended, which is a significant improvement compared to before. CIWA assessments discontinued as he has scored less then or equal to 4 for more then 24 hours along with prn medications for withdrawal related symptoms (librium , zofran , and imodium ). He reports that his appetite has been improving. Reports that last night he was administered melatonin along with his prn hydroxyzine , which has caused him to feel weird this morning. He reports that he feels like he overtook his medicine and denies that he was anxious last night. Educated patient that hydroxyzine  is an optional medication he can request for anxiety and to help with sleep and if he isn't experiencing either, he can refuse to take it. He reports daytime drowsiness from taking hydroxyzine  last night. Discussed reducing dose to 10 mg tid from 25 mg tid of hydroxyzine  to primarily use for anxiety and he is agreeable to this. Denies feeling dizzy or lightheaded today. Reports yawning some since he was started on zoloft , educated patient  that this is one of the side effects associated with zoloft . He reports liking zoloft  better then previously being on lexapro , he reports feeling more relaxed, decreased anxiety, and improving motivation and energy levels. Patient reports that he was on naltrexone  previously on a daily basis, but isn't able to recall how long he was on it for but denies that he was concurrently using alcohol  while taking the naltrexone . He would like to be restarted on naltrexone  because it helped him with alcohol  cravings before. Provided education about naltrexone  25 mg by mouth daily for alcohol  dependence/cravings, uses, mechanism of action, risks versus benefits, side/adverse effects, and monitoring required. Informed patient that we will keep an eye on his liver enzymes. On admission, they were elevated on 12/10/2024 with AST of 192 and ALT of 149 but most recently when rechecked on 12/12/2024 they have significantly trended downwards with AST of 73 and ALT 88. Informed patient that upon discharge, he will need to follow-up with his psychiatrist while on naltrexone  to ensure it doesn't cause any increase in liver enzymes given that it has the potential to cause hepatic impairment. He is agreeable to this. He denies suicidal and homicidal ideations. He denies auditory and visual hallucinations. He denies paranoia.   Disposition: Tentative discharge date of Monday, January 19th to home. He has been accepted to [facility] for January 28th and has an appointment at [facility] that day. His Aunt will help with transporting him to [facility]. He will need a taxi voucher from nursing to get home on Monday.   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)  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)  Alcohol  use disorder, severe, dependence (HCC)  GAD (generalized anxiety disorder)  Elevated liver enzymes    Total Time spent with patient: 20 minutes  Past Psychiatric History: alcohol   use disorder, generalized anxiety disorder, major depressive disorder Past Medical History:  Past Medical History:  Diagnosis Date   Alcohol  abuse    Anxiety and depression    Bilateral primary osteoarthritis of knee    Carpal tunnel syndrome of right wrist Dx 2015   Cervical stenosis of spine    MRI 10/2021   Complication of anesthesia    Essential hypertension    Gallbladder sludge    GERD (gastroesophageal reflux disease) Dx 2007   Hepatic steatosis    History of alcoholic hepatitis    History of CVA (cerebrovascular accident)    05/2021 MRI brain: Mild chronic microvascular ischemic change in the white matter. Small chronic infarct left superior cerebellum.  Also, chronic microhemorrhage, ? 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)    Family History: father (diabetes and HTN), paternal grandfather (HTN) Family Psychiatric  History: paternal grandfather (alcohol  abuse), maternal grandfather (alcohol  abuse), maternal grandmother (alcohol  abuse), paternal grandmother (alcohol  abuse) Social History: Lives alone. Single with 2 children.   Additional Social History:                         Sleep: Good  Appetite:  Good  Current Medications:  Current Facility-Administered Medications  Medication Dose Route Frequency Provider Last Rate Last Admin   alum & mag hydroxide-simeth (MAALOX/MYLANTA) 200-200-20 MG/5ML suspension 30 mL  30 mL Oral Q4H PRN Onuoha, Chinwendu V, NP       feeding supplement (ENSURE PLUS HIGH PROTEIN) liquid 237 mL  237 mL Oral BID BM Cherry Wittwer, NP   237 mL at 12/13/24 9075   gabapentin  (NEURONTIN ) capsule 200 mg  200 mg Oral TID Stana Bayon, NP   200 mg  at 12/13/24 9075   hydrOXYzine  (ATARAX ) tablet 10 mg  10 mg Oral TID PRN Analei Whinery, NP       LORazepam  (ATIVAN ) injection 2 mg  2 mg Intramuscular Once Nkwenti, Doris, NP       magnesium  hydroxide (MILK OF MAGNESIA) suspension 15 mL  15 mL Oral Daily PRN Onuoha, Chinwendu V, NP       melatonin tablet 5 mg  5 mg Oral QHS Nkwenti, Doris, NP   5 mg at 12/12/24 2146   multivitamin with minerals tablet 1 tablet  1 tablet Oral Daily Onuoha, Chinwendu V, NP   1 tablet at 12/13/24 9075   naltrexone  (DEPADE) tablet 25 mg  25 mg Oral Daily Jessicalynn Deshong, NP       OLANZapine  zydis (ZYPREXA ) disintegrating tablet 5 mg  5 mg Oral TID PRN Lorien Shingler, NP       sertraline  (ZOLOFT ) tablet 50 mg  50 mg Oral Daily Nkwenti, Doris, NP   50 mg at 12/13/24 9075   thiamine  (VITAMIN B1) tablet 100 mg  100 mg Oral Daily Bee Hammerschmidt, NP   100 mg  at 12/13/24 0924   Current Outpatient Medications  Medication Sig Dispense Refill   escitalopram  (LEXAPRO ) 20 MG tablet Take 1.5 tablets (30 mg total) by mouth daily. 90 tablet 1   gabapentin  (NEURONTIN ) 300 MG capsule Take 300 mg by mouth 3 (three) times daily.      Labs  Lab Results:  Admission on 12/10/2024  Component Date Value Ref Range Status   WBC 12/10/2024 3.9 (L)  4.0 - 10.5 K/uL Final   RBC 12/10/2024 4.83  4.22 - 5.81 MIL/uL Final   Hemoglobin 12/10/2024 13.7  13.0 - 17.0 g/dL Final   HCT 98/85/7973 40.8  39.0 - 52.0 % Final   MCV 12/10/2024 84.5  80.0 - 100.0 fL Final   MCH 12/10/2024 28.4  26.0 - 34.0 pg Final   MCHC 12/10/2024 33.6  30.0 - 36.0 g/dL Final   RDW 98/85/7973 14.6  11.5 - 15.5 % Final   Platelets 12/10/2024 84 (L)  150 - 400 K/uL Final   Comment: Immature Platelet Fraction may be clinically indicated, consider ordering this additional test [id number]    nRBC 12/10/2024 0.0  0.0 - 0.2 % Final   Neutrophils Relative % 12/10/2024 71  % Final   Neutro Abs 12/10/2024 2.8  1.7 - 7.7 K/uL Final   Lymphocytes Relative 12/10/2024 15   % Final   Lymphs Abs 12/10/2024 0.6 (L)  0.7 - 4.0 K/uL Final   Monocytes Relative 12/10/2024 11  % Final   Monocytes Absolute 12/10/2024 0.4  0.1 - 1.0 K/uL Final   Eosinophils Relative 12/10/2024 1  % Final   Eosinophils Absolute 12/10/2024 0.0  0.0 - 0.5 K/uL Final   Basophils Relative 12/10/2024 1  % Final   Basophils Absolute 12/10/2024 0.0  0.0 - 0.1 K/uL Final   WBC Morphology 12/10/2024 MORPHOLOGY UNREMARKABLE   Final   RBC Morphology 12/10/2024 MORPHOLOGY UNREMARKABLE   Final   Smear Review 12/10/2024 See Note   Final   PLATELETS APPEAR DECREASED   Immature Granulocytes 12/10/2024 1  % Final   Abs Immature Granulocytes 12/10/2024 0.02  0.00 - 0.07 K/uL Final   Performed at [facility], [address], [place], [place] [address]   Sodium 12/10/2024 134 (L)  135 - 145 mmol/L Final   Potassium 12/10/2024 4.0  3.5 - 5.1 mmol/L Final   Chloride 12/10/2024 93 (L)  98 - 111 mmol/L Final   CO2 12/10/2024 27  22 - 32 mmol/L Final   Glucose, Bld 12/10/2024 84  70 - 99 mg/dL Final   Glucose reference range applies only to samples taken after fasting for at least 8 hours.   BUN 12/10/2024 6 (L)  8 - 23 mg/dL Final   Creatinine, Ser 12/10/2024 0.96  0.61 - 1.24 mg/dL Final   Calcium  12/10/2024 9.5  8.9 - 10.3 mg/dL Final   Total Protein 98/85/7973 8.0  6.5 - 8.1 g/dL Final   Albumin  12/10/2024 4.7  3.5 - 5.0 g/dL Final   AST 98/85/7973 192 (H)  15 - 41 U/L Final   ALT 12/10/2024 149 (H)  0 - 44 U/L Final   Alkaline Phosphatase 12/10/2024 53  38 - 126 U/L Final   Total Bilirubin 12/10/2024 1.3 (H)  0.0 - 1.2 mg/dL Final   GFR, Estimated 12/10/2024 >60  >60 mL/min Final   Comment: (NOTE) Calculated using the CKD-EPI Creatinine Equation (2021)    Anion gap 12/10/2024 15  5 - 15 Final   Performed at [facility],  [address] [place], [place] [address]   Alcohol , Ethyl (B) 12/10/2024 <15  <15 mg/dL Final   Comment: (NOTE) For medical purposes only. Performed at [facility], [address], [place], [place] [address]    Cholesterol 12/10/2024 335 (H)  0 - 200 mg/dL Final   Comment:        ATP III CLASSIFICATION:  <200     mg/dL   Desirable  799-760  mg/dL   Borderline High  >=759    mg/dL   High           Triglycerides 12/10/2024 114  <150 mg/dL Final   HDL 98/85/7973 >135  >40 mg/dL Final   Total CHOL/HDL Ratio 12/10/2024 NOT CALCULATED  RATIO Final   VLDL 12/10/2024 23  0 - 40 mg/dL Final   LDL Cholesterol 12/10/2024 NOT CALCULATED  0 - 99 mg/dL Final   Comment:        Total Cholesterol/HDL:CHD Risk Coronary Heart Disease Risk Table                     Men   Women  1/2 Average Risk   3.4   3.3  Average Risk       5.0   4.4  2 X Average Risk   9.6   7.1  3 X Average Risk  23.4   11.0        Use the calculated Patient Ratio above and the CHD Risk Table to determine the patient's CHD Risk.        ATP III CLASSIFICATION (LDL):  <100     mg/dL   Optimal  899-870  mg/dL   Near or Above                    Optimal  130-159  mg/dL   Borderline  839-810  mg/dL   High  >809     mg/dL   Very High Performed at [facility], [address] [place], [place] [address]    TSH 12/10/2024 1.210  0.350 - 4.500 uIU/mL Final   Performed at [facility], [address], [place], [place] [address]   POC Amphetamine UR 12/10/2024 None Detected  NONE DETECTED (Cut Off Level 1000 ng/mL) Final   POC Secobarbital (BAR) 12/10/2024 None Detected  NONE DETECTED (Cut Off Level 300 ng/mL) Final   POC Buprenorphine (BUP) 12/10/2024 None Detected  NONE DETECTED (Cut Off Level 10 ng/mL) Final   POC Oxazepam  (BZO) 12/10/2024 Positive (A)  NONE DETECTED (Cut Off Level 300 ng/mL) Corrected   POC Cocaine UR 12/10/2024 None Detected  NONE DETECTED (Cut Off Level 300 ng/mL) Final   POC Methamphetamine UR 12/10/2024 None Detected  NONE DETECTED (Cut Off Level 1000 ng/mL) Final   POC Morphine  12/10/2024 None Detected  NONE DETECTED (Cut Off Level 300 ng/mL)  Final   POC Methadone UR 12/10/2024 None Detected  NONE DETECTED (Cut Off Level 300 ng/mL) Final   POC Oxycodone  UR 12/10/2024 None Detected  NONE DETECTED (Cut Off Level 100 ng/mL) Final   POC Marijuana UR 12/10/2024 None Detected  NONE DETECTED (Cut Off Level 50 ng/mL) Final   WBC 12/12/2024 5.4  4.0 - 10.5 K/uL Final   RBC 12/12/2024 4.38  4.22 - 5.81 MIL/uL Final   Hemoglobin 12/12/2024 12.6 (L)  13.0 - 17.0 g/dL Final   HCT 98/83/7973 38.4 (L)  39.0 - 52.0 % Final   MCV 12/12/2024 87.7  80.0 - 100.0 fL Final   MCH 12/12/2024 28.8  26.0 - 34.0 pg Final   MCHC 12/12/2024 32.8  30.0 - 36.0 g/dL Final   RDW 98/83/7973 14.7  11.5 - 15.5 % Final   Platelets 12/12/2024 99 (L)  150 - 400 K/uL Final   Comment: REPEATED TO VERIFY Immature Platelet Fraction may be clinically indicated, consider ordering this additional test [id number]    nRBC 12/12/2024 0.0  0.0 - 0.2 % Final   Performed at [facility] Lab, [address] [place], [place] [address]   Total Protein 12/12/2024 7.8  6.5 - 8.1 g/dL Final   Albumin  12/12/2024 4.4  3.5 - 5.0 g/dL Final   AST 98/83/7973 73 (H)  15 - 41 U/L Final   ALT 12/12/2024 88 (H)  0 - 44 U/L Final   Alkaline Phosphatase 12/12/2024 52  38 - 126 U/L Final   Total Bilirubin 12/12/2024 0.7  0.0 - 1.2 mg/dL Final   Bilirubin, Direct 12/12/2024 0.3 (H)  0.0 - 0.2 mg/dL Final   Indirect Bilirubin 12/12/2024 0.4  0.3 - 0.9 mg/dL Final   Performed at [facility], [address], [place], [place] [address]    Blood Alcohol  level:  Lab Results  Component Value Date   ETH <15 12/10/2024   ETH 320 (HH) 07/19/2023    Metabolic Disorder Labs: Lab Results  Component Value Date   HGBA1C 5.7 (H) 01/21/2024   MPG 117 01/21/2024   MPG 126 11/12/2022   No results found for: PROLACTIN Lab Results  Component Value Date   CHOL 335 (H) 12/10/2024   TRIG 114 12/10/2024   HDL >135 12/10/2024   CHOLHDL NOT CALCULATED 12/10/2024   VLDL 23 12/10/2024    LDLCALC NOT CALCULATED 12/10/2024   LDLCALC NOT CALCULATED 11/10/2022    Therapeutic Lab Levels: No results found for: LITHIUM No results found for: VALPROATE No results found for: CBMZ  Physical Findings   AIMS    Flowsheet Row [facility]) [facility] (Discharged) [facility] Ad[facility] (Discharged) [facility]  AIMS Total Score 0 0 0   AUDIT    Flowsheet Row Ad[facility]) [facility] Admission (Discharged) from [facility] Admission (Discharged) from [facility] Admission (Discharged) from [facility]06[facility]2014 [facility] Admission (Discharged) from [facility]  Alcohol  Use Disorder Identification Test Final Score (AUDIT) 40 34 35 26 23   GAD-7    Flowsheet Row Office Visit from 04/15/2024 in [facility] Office Visit from 03/28/2024 in [facility] [place] - A Dept Of [place]. [facility] Office Visit from 02/15/2024 in [facility] [place] - A Dept Of [place]. [facility] Office Visit from 05/10/2023 in [facility] - A Dept Of [place] [facility]. [facility] Office Visit from 02/21/2023 in [facility] [place] - A Dept Of [place] [facility]. [facility]  Total GAD-7 Score 11 10 11 14 11    PHQ2-9    Flowsheet Row ED from 12/10/2024 in [facility] Office Visit from 04/15/2024 in [facility] Office Visit from 03/28/2024 in [facility] [place] -  A Dept Of [place]. [facility] Office Visit from 02/15/2024 in [facility]  [place] - A Dept Of [place] [facility]. [facility] Office Visit from 05/10/2023 in [facility] [place] - A Dept Of [place]. [facility]  PHQ-2 Total Score 0 1 2 3 6   PHQ-9 Total Score 2 -- 7 7 20    Flowsheet Row ED from 12/10/2024 in [facility] ED from 12/09/2024 in [facility] Office Visit from 04/15/2024 in [facility]  C-SSRS RISK CATEGORY No Risk No Risk No Risk     Musculoskeletal  Strength & Muscle Tone: within normal limits Gait & Station: normal Patient leans: N/A  Psychiatric Specialty Exam  Presentation  General Appearance:  Appropriate for Environment; Fairly Groomed  Eye Contact: Good  Speech: Clear and Coherent; Normal Rate  Speech Volume: Normal  Handedness: Right   Mood and Affect  Mood: Euthymic; Anxious  Affect: Appropriate; Congruent   Thought Process  Thought Processes: Coherent  Descriptions of Associations:Intact  Orientation:Full (Time, Place and Person)  Thought Content:Logical; WDL  Diagnosis of Schizophrenia or Schizoaffective disorder in past: No    Hallucinations:Hallucinations: None  Ideas of Reference:None  Suicidal Thoughts:Suicidal Thoughts: No  Homicidal Thoughts:Homicidal Thoughts: No   Sensorium  Memory: Immediate Good; Recent Good; Remote Good  Judgment: Good  Insight: Good   Executive Functions  Concentration: Good  Attention Span: Good  Recall: Good  Fund of Knowledge: Good  Language: Good   Psychomotor Activity  Psychomotor Activity: Psychomotor Activity: Normal   Assets  Assets: Communication Skills; Desire for Improvement; Housing; Resilience; Social Support   Sleep  Sleep: Sleep: Good  Estimated Sleeping Duration (Last 24 Hours): 10.25-13.75 hours  No data recorded  Physical Exam  Physical Exam Vitals and nursing note reviewed.  Constitutional:      Appearance:  Normal appearance.  HENT:     Head: Normocephalic.     Nose: Nose normal.  Cardiovascular:     Rate and Rhythm: Normal rate.  Pulmonary:     Effort: Pulmonary effort is normal.  Musculoskeletal:        General: Normal range of motion.     Cervical back: Normal range of motion.  Neurological:     Mental Status: He is alert and oriented to person, place, and time.  Psychiatric:        Attention and Perception: Attention normal. He does not perceive auditory or visual hallucinations.        Mood and Affect: Affect normal. Mood is anxious.        Speech: Speech normal.        Behavior: Behavior normal. Behavior is cooperative.        Thought Content: Thought content normal. Thought content is not paranoid or delusional. Thought content does not include homicidal or suicidal ideation. Thought content does not include homicidal or suicidal plan.        Cognition and Memory: Cognition normal.        Judgment: Judgment normal.    Review of Systems  Constitutional: Negative.   HENT: Negative.    Eyes: Negative.   Respiratory: Negative.    Cardiovascular: Negative.   Gastrointestinal: Negative.   Genitourinary: Negative.   Musculoskeletal: Negative.   Skin: Negative.   Neurological: Negative.   Endo/Heme/Allergies: Negative.   Psychiatric/Behavioral:  Positive for depression and substance abuse. Negative for hallucinations, memory loss and suicidal ideas. The patient is nervous/anxious.  The patient does not have insomnia.    Blood pressure 120/82, pulse 100, temperature 98.3 F (36.8 C), temperature source Oral, resp. rate 16, SpO2 98%. There is no height or weight on file to calculate BMI.  Treatment Plan Summary: Daily contact with patient to assess and evaluate symptoms and progress in treatment and Medication management Bleeding precautions due to low platelet count, which is trending upwards. Platelet count was 84 on 12/10/2024 and most recently on 12/12/2024 was 99.    Medication Management:  -Continue agitation protocol (see MAR) -Continue Maalox/Mylanta 30 mL p.o. every 4 hours as needed indigestion -CIWA assessment discontinued along with Librium  25 mg p.o. every 6 hours as needed CIWA score greater than 10 -Continue Ensure high-protein p.o. twice daily between meals -Continue gabapentin  200 mg p.o. 3 times daily -Reduced hydroxyzine  from 25 mg to 10 mg p.o. 3 times daily as needed anxiety due to previous syncope episode and daytime drowsiness after taking a dose last night -Continue milk of magnesia 15 mL p.o. daily as needed mild constipation -Continue Multivitamin 1 tablet p.o. daily for supplementation -Continue Zoloft  50 mg p.o. daily for MDD/GAD -Continue thiamine  100 mg p.o. daily -Continue melatonin 5 mg by mouth at bedtime for sleep -Start naltrexone  25 mg by mouth daily tomorrow, 12/13/2024 for alcohol  dependence/cravings  Disposition: Tentative discharge date of Monday, January 19th to home. He has been accepted to [facility] for January 28th and has an appointment at [facility] that day. His Aunt will help with transporting him to [facility]. He will need a taxi voucher from nursing to get home on Monday.   Discharge Planning: Social work and case management to assist with discharge planning and identification of hospital follow-up needs prior to discharge Estimated LOS: 3-5 days Discharge Concerns: Need to establish a safety plan; Medication compliance and effectiveness Discharge Goals: Return home with outpatient referrals for mental health follow-up including medication management/psychotherapy   Recommendations  Continue admission to the [facility] and will wok with CSW to gain access to substance abuse rehab. Discharge date will be determined based on progress.   I certify that inpatient services furnished can reasonably be expected to improve the patient's condition.    Dahmir Epperly, NP 12/13/2024 12:28 PM

## 2024-12-14 DIAGNOSIS — F1024 Alcohol dependence with alcohol-induced mood disorder: Secondary | ICD-10-CM | POA: Diagnosis not present

## 2024-12-14 DIAGNOSIS — Z79899 Other long term (current) drug therapy: Secondary | ICD-10-CM | POA: Diagnosis not present

## 2024-12-14 DIAGNOSIS — F411 Generalized anxiety disorder: Secondary | ICD-10-CM | POA: Diagnosis not present

## 2024-12-14 DIAGNOSIS — R748 Abnormal levels of other serum enzymes: Secondary | ICD-10-CM | POA: Diagnosis not present

## 2024-12-14 MED ORDER — ADULT MULTIVITAMIN W/MINERALS CH: 1.0000 | ORAL_TABLET | Freq: Every day | ORAL | 0 refills | Status: AC

## 2024-12-14 MED ORDER — MELATONIN 5 MG PO TABS: 5.0000 mg | ORAL_TABLET | Freq: Every day | ORAL | 0 refills | Status: AC

## 2024-12-14 MED ORDER — SERTRALINE HCL 50 MG PO TABS: 50.0000 mg | ORAL_TABLET | Freq: Every day | ORAL | 0 refills | Status: AC

## 2024-12-14 MED ORDER — NALTREXONE HCL 50 MG PO TABS: 25.0000 mg | ORAL_TABLET | Freq: Every day | ORAL | 0 refills | Status: AC

## 2024-12-14 MED ORDER — GABAPENTIN 300 MG PO CAPS
300.0000 mg | ORAL_CAPSULE | Freq: Three times a day (TID) | ORAL | Status: DC
  Administered 2024-12-14 – 2024-12-15 (×3): 300 mg via ORAL
  Filled 2024-12-14: qty 1
  Filled 2024-12-14: qty 21
  Filled 2024-12-14 (×2): qty 1

## 2024-12-14 NOTE — ED Notes (Addendum)
 Pt resting quietly doesn't voice any complaints of distress or discomfort . Pt using the bathroom throughout infrequent intervals through out night .Safety maintained.

## 2024-12-14 NOTE — Group Note (Signed)
 Group Topic: Emotional Regulation  Group Date: 12/14/2024 Start Time: 1200 End Time: 1230 Facilitators: Reinhold Minor BROCKS, RN  Department: [facility]  Number of Participants: 8  Group Focus: acceptance and relaxation Treatment Modality:  Psychoeducation Interventions utilized were exploration Purpose: reinforce self-care  Name: [name] [name] Date of Birth: Oct 15, 1961  MR: [id number]    Level of Participation: moderate Quality of Participation: cooperative Interactions with others: gave feedback Mood/Affect: appropriate Triggers (if applicable): na Cognition: coherent/clear Progress: Moderate Response: practice relaxation techniques Plan: patient will be encouraged to continue practicing what they learned  Patients Problems:  Patient Active Problem List   Diagnosis Date Noted   Malnutrition of moderate degree 11/04/2023   Acute stress disorder 11/04/2023   Status post surgery 10/08/2023   Alcohol  use disorder, severe, dependence (HCC) 11/11/2022   Alcohol  use disorder, severe, in early remission (HCC) 11/10/2022   Alcoholic peripheral neuropathy 11/10/2022   Alcohol  abuse with intoxication 03/27/2022   Neuropathy 12/30/2021   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) 12/27/2021   Uncomplicated alcohol  dependence (HCC) 06/16/2021   Essential hypertension 04/07/2021   Prediabetes 04/07/2021   Other hyperlipidemia 04/07/2021   Major depressive disorder, severe (HCC) 03/16/2021   History of hyperlipidemia 12/21/2020   Seborrheic dermatitis of scalp 12/21/2020   Substance abuse (HCC) 12/21/2020   Hyperopia of both eyes with astigmatism and presbyopia 12/20/2020   Posterior vitreous detachment of right eye 12/20/2020   MDD (major depressive disorder), recurrent episode, severe (HCC) 06/30/2017   Lateral epicondylitis of right elbow 01/05/2017   Strain, MCP, hand, right, initial encounter 01/05/2017   Vasomotor rhinitis  02/13/2016   Bilateral chronic knee pain 01/11/2016   Chronic pain of right wrist 07/07/2015   Carpal tunnel syndrome of right wrist 05/20/2015   Dislocation of right shoulder joint 05/20/2015   Tear of medial meniscus of right knee 03/24/2015   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HCC) 01/27/2015   Chronic cough 01/15/2015   Onychomycosis of toenail 12/15/2014   Dermatitis of face 11/11/2014   GERD (gastroesophageal reflux disease)    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

## 2024-12-14 NOTE — ED Provider Notes (Addendum)
 Behavioral Health Progress Note  Date and Time: 12/14/2024 8:45 AM Name: [name] [name] MRN:  [id number]  HPI: [name] [name] is a 64 year old AAM with a mental health history of alcohol  use d/o anxiety, depression and medical history of hypertension, who presented voluntarily as a walk-in earlier today morning (12/10/24) to [facility] seeking detox from alcohol . Patient was admitted to the [facility] with a goal of detoxing.  Subjective: Patient seen face-to-face by this provider and chart reviewed on 12/14/2024. On evaluation, patient is alert and oriented x 4. He denies any alcohol  related withdrawal symptoms. He reports tolerating his medications well without any side effects. He would like his gabapentin  increased back up to his home dose of 300 mg tid. Increased dosage of gabapentin  from 200 mg tid to 300 mg tid for neuropathy, GAD, and alcohol  dependence/maintenance of abstinence. Reports that he slept fairly well last night and got up once around 5:30 AM. Reports feeling well rested. Reports appetite as good and energy as good. Rates depression a 0 and anxiety a low 5 on a scale of 0-10 (10 being the worst). Reports feeling much better this morning due to not taking his prn atarax  last night with his melatonin. He reports not feeling spacy compared to yesterday morning. Reports that overall with zoloft , his anxiety has decreased and feels different. When asked to describe how he feels different, he reports that he feels more relaxed. Reports that even though they are told to avoid people, places, and things he doesn't have any changes to make there related to his alcohol  use. He reports living alone and acknowledges that he'll need to stay busy as one step of abstaining from alcohol  use. Discussed that he'll need to continue taking his medications as prescribed upon discharge as well to help manage his anxiety to prevent any relapses in symptoms. He verbally demonstrates understanding.  He denies suicidal and homicidal ideations. He denies auditory and visual hallucinations. He denies paranoia. Discharge date for tomorrow, 12/15/2024 to home with plan for him to attend program at [facility] on 12/24/2024 at 9 AM.   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)  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)  Alcohol  use disorder, severe, dependence (HCC)  GAD (generalized anxiety disorder)  Elevated liver enzymes    Total Time spent with patient: 20 minutes  Past Psychiatric History: alcohol  use disorder, generalized anxiety disorder, major depressive disorder Past Medical History:  Past Medical History:  Diagnosis Date   Alcohol  abuse    Anxiety and depression    Bilateral primary osteoarthritis of knee    Carpal tunnel syndrome of right wrist Dx 2015   Cervical stenosis of spine    MRI 10/2021   Complication of anesthesia    Essential hypertension    Gallbladder sludge    GERD (gastroesophageal reflux disease) Dx 2007   Hepatic steatosis    History of alcoholic hepatitis    History of CVA (cerebrovascular accident)    05/2021 MRI brain: Mild chronic microvascular ischemic change in the white matter. Small chronic infarct left superior cerebellum.  Also, chronic microhemorrhage, ? 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 Single with 2  children.  Additional Social History:                         Sleep: Good  Appetite:  Good  Current Medications:  Current Facility-Administered Medications  Medication Dose Route Frequency Provider Last Rate Last Admin   alum & mag hydroxide-simeth (MAALOX/MYLANTA) 200-200-20 MG/5ML suspension 30 mL  30 mL Oral Q4H PRN [name], Chinwendu V, NP       feeding supplement (ENSURE PLUS HIGH PROTEIN) liquid 237 mL  237 mL Oral BID BM [name] Epling, NP   237 mL at 12/14/24 9057   gabapentin  (NEURONTIN ) capsule 300 mg  300 mg Oral TID [name] Durkin, NP       hydrOXYzine  (ATARAX ) tablet 10 mg  10 mg Oral TID PRN [name] Hilburn, NP   10 mg at 12/13/24 2143   LORazepam  (ATIVAN ) injection 2 mg  2 mg Intramuscular Once [name], Doris, NP       magnesium  hydroxide (MILK OF MAGNESIA) suspension 15 mL  15 mL Oral Daily PRN [name], Chinwendu V, NP       melatonin tablet 5 mg  5 mg Oral QHS [name], Doris, NP   5 mg at 12/13/24 2143   multivitamin with minerals tablet 1 tablet  1 tablet Oral Daily [name], Chinwendu V, NP   1 tablet at 12/14/24 0944   naltrexone  (DEPADE) tablet 25 mg  25 mg Oral Daily [name] Lemming, NP   25 mg at 12/14/24 9057   OLANZapine  zydis (ZYPREXA ) disintegrating tablet 5 mg  5 mg Oral TID PRN [name] Harari, NP       sertraline  (ZOLOFT ) tablet 50 mg  50 mg Oral Daily [name], Doris, NP   50 mg at 12/14/24 9056   thiamine  (VITAMIN B1) tablet 100 mg  100 mg Oral Daily [name] Alesi, NP   100 mg at 12/14/24 9057   Current Outpatient Medications  Medication Sig Dispense Refill   escitalopram  (LEXAPRO ) 20 MG tablet Take 1.5 tablets (30 mg total) by mouth daily. 90 tablet 1   gabapentin  (NEURONTIN ) 300 MG capsule Take 300 mg by mouth 3 (three) times daily.      Labs  Lab Results:  Admission on 12/10/2024  Component Date Value Ref Range Status   WBC 12/10/2024 3.9 (L)  4.0 - 10.5 K/uL Final   RBC 12/10/2024 4.83  4.22 - 5.81 MIL/uL Final   Hemoglobin 12/10/2024 13.7   13.0 - 17.0 g/dL Final   HCT 98/85/7973 40.8  39.0 - 52.0 % Final   MCV 12/10/2024 84.5  80.0 - 100.0 fL Final   MCH 12/10/2024 28.4  26.0 - 34.0 pg Final   MCHC 12/10/2024 33.6  30.0 - 36.0 g/dL Final   RDW 98/85/7973 14.6  11.5 - 15.5 % Final   Platelets 12/10/2024 84 (L)  150 - 400 K/uL Final   Comment: Immature Platelet Fraction may be clinically indicated, consider ordering this additional test [id number]    nRBC 12/10/2024 0.0  0.0 - 0.2 % Final   Neutrophils Relative % 12/10/2024 71  % Final   Neutro Abs 12/10/2024 2.8  1.7 - 7.7 K/uL Final   Lymphocytes Relative 12/10/2024 15  %  Final   Lymphs Abs 12/10/2024 0.6 (L)  0.7 - 4.0 K/uL Final   Monocytes Relative 12/10/2024 11  % Final   Monocytes Absolute 12/10/2024 0.4  0.1 - 1.0 K/uL Final   Eosinophils Relative 12/10/2024 1  % Final   Eosinophils Absolute 12/10/2024 0.0  0.0 - 0.5 K/uL Final   Basophils Relative 12/10/2024 1  % Final   Basophils Absolute 12/10/2024 0.0  0.0 - 0.1 K/uL Final   WBC Morphology 12/10/2024 MORPHOLOGY UNREMARKABLE   Final   RBC Morphology 12/10/2024 MORPHOLOGY UNREMARKABLE   Final   Smear Review 12/10/2024 See Note   Final   PLATELETS APPEAR DECREASED   Immature Granulocytes 12/10/2024 1  % Final   Abs Immature Granulocytes 12/10/2024 0.02  0.00 - 0.07 K/uL Final   Performed at [facility], [address], [place], [place] [address]   Sodium 12/10/2024 134 (L)  135 - 145 mmol/L Final   Potassium 12/10/2024 4.0  3.5 - 5.1 mmol/L Final   Chloride 12/10/2024 93 (L)  98 - 111 mmol/L Final   CO2 12/10/2024 27  22 - 32 mmol/L Final   Glucose, Bld 12/10/2024 84  70 - 99 mg/dL Final   Glucose reference range applies only to samples taken after fasting for at least 8 hours.   BUN 12/10/2024 6 (L)  8 - 23 mg/dL Final   Creatinine, Ser 12/10/2024 0.96  0.61 - 1.24 mg/dL Final   Calcium  12/10/2024 9.5  8.9 - 10.3 mg/dL Final   Total Protein 98/85/7973 8.0  6.5 - 8.1 g/dL Final   Albumin  12/10/2024  4.7  3.5 - 5.0 g/dL Final   AST 98/85/7973 192 (H)  15 - 41 U/L Final   ALT 12/10/2024 149 (H)  0 - 44 U/L Final   Alkaline Phosphatase 12/10/2024 53  38 - 126 U/L Final   Total Bilirubin 12/10/2024 1.3 (H)  0.0 - 1.2 mg/dL Final   GFR, Estimated 12/10/2024 >60  >60 mL/min Final   Comment: (NOTE) Calculated using the CKD-EPI Creatinine Equation (2021)    Anion gap 12/10/2024 15  5 - 15 Final   Performed at [facility], [address] [place], [place] [address]   Alcohol , Ethyl (B) 12/10/2024 <15  <15 mg/dL Final   Comment: (NOTE) For medical purposes only. Performed at [facility], [address], [place], [place] [address]    Cholesterol 12/10/2024 335 (H)  0 - 200 mg/dL Final   Comment:        ATP III CLASSIFICATION:  <200     mg/dL   Desirable  799-760  mg/dL   Borderline High  >=759    mg/dL   High           Triglycerides 12/10/2024 114  <150 mg/dL Final   HDL 98/85/7973 >135  >40 mg/dL Final   Total CHOL/HDL Ratio 12/10/2024 NOT CALCULATED  RATIO Final   VLDL 12/10/2024 23  0 - 40 mg/dL Final   LDL Cholesterol 12/10/2024 NOT CALCULATED  0 - 99 mg/dL Final   Comment:        Total Cholesterol/HDL:CHD Risk Coronary Heart Disease Risk Table                     Men   Women  1/2 Average Risk   3.4   3.3  Average Risk       5.0   4.4  2 X Average Risk   9.6  7.1  3 X Average Risk  23.4   11.0        Use the calculated Patient Ratio above and the CHD Risk Table to determine the patient's CHD Risk.        ATP III CLASSIFICATION (LDL):  <100     mg/dL   Optimal  899-870  mg/dL   Near or Above                    Optimal  130-159  mg/dL   Borderline  839-810  mg/dL   High  >809     mg/dL   Very High Performed at [facility], [address] [place], [place] [address]    TSH 12/10/2024 1.210  0.350 - 4.500 uIU/mL Final   Performed at [facility], [address] [place], [place] [address]   POC Amphetamine UR 12/10/2024 None Detected  NONE  DETECTED (Cut Off Level 1000 ng/mL) Final   POC Secobarbital (BAR) 12/10/2024 None Detected  NONE DETECTED (Cut Off Level 300 ng/mL) Final   POC Buprenorphine (BUP) 12/10/2024 None Detected  NONE DETECTED (Cut Off Level 10 ng/mL) Final   POC Oxazepam  (BZO) 12/10/2024 Positive (A)  NONE DETECTED (Cut Off Level 300 ng/mL) Corrected   POC Cocaine UR 12/10/2024 None Detected  NONE DETECTED (Cut Off Level 300 ng/mL) Final   POC Methamphetamine UR 12/10/2024 None Detected  NONE DETECTED (Cut Off Level 1000 ng/mL) Final   POC Morphine  12/10/2024 None Detected  NONE DETECTED (Cut Off Level 300 ng/mL) Final   POC Methadone UR 12/10/2024 None Detected  NONE DETECTED (Cut Off Level 300 ng/mL) Final   POC Oxycodone  UR 12/10/2024 None Detected  NONE DETECTED (Cut Off Level 100 ng/mL) Final   POC Marijuana UR 12/10/2024 None Detected  NONE DETECTED (Cut Off Level 50 ng/mL) Final   WBC 12/12/2024 5.4  4.0 - 10.5 K/uL Final   RBC 12/12/2024 4.38  4.22 - 5.81 MIL/uL Final   Hemoglobin 12/12/2024 12.6 (L)  13.0 - 17.0 g/dL Final   HCT 98/83/7973 38.4 (L)  39.0 - 52.0 % Final   MCV 12/12/2024 87.7  80.0 - 100.0 fL Final   MCH 12/12/2024 28.8  26.0 - 34.0 pg Final   MCHC 12/12/2024 32.8  30.0 - 36.0 g/dL Final   RDW 98/83/7973 14.7  11.5 - 15.5 % Final   Platelets 12/12/2024 99 (L)  150 - 400 K/uL Final   Comment: REPEATED TO VERIFY Immature Platelet Fraction may be clinically indicated, consider ordering this additional test [id number]    nRBC 12/12/2024 0.0  0.0 - 0.2 % Final   Performed at [facility], [address], [place], [place] [address]   Total Protein 12/12/2024 7.8  6.5 - 8.1 g/dL Final   Albumin  12/12/2024 4.4  3.5 - 5.0 g/dL Final   AST 98/83/7973 73 (H)  15 - 41 U/L Final   ALT 12/12/2024 88 (H)  0 - 44 U/L Final   Alkaline Phosphatase 12/12/2024 52  38 - 126 U/L Final   Total Bilirubin 12/12/2024 0.7  0.0 - 1.2 mg/dL Final   Bilirubin, Direct 12/12/2024 0.3 (H)  0.0 - 0.2 mg/dL  Final   Indirect Bilirubin 12/12/2024 0.4  0.3 - 0.9 mg/dL Final   Performed at [facility], [address], [place], [place] [address]    Blood Alcohol  level:  Lab Results  Component Value Date   [facility] <15 12/10/2024  ETH 320 (HH) 07/19/2023    Metabolic Disorder Labs: Lab Results  Component Value Date   HGBA1C 5.7 (H) 01/21/2024   MPG 117 01/21/2024   MPG 126 11/12/2022   No results found for: PROLACTIN Lab Results  Component Value Date   CHOL 335 (H) 12/10/2024   TRIG 114 12/10/2024   HDL >135 12/10/2024   CHOLHDL NOT CALCULATED 12/10/2024   VLDL 23 12/10/2024   LDLCALC NOT CALCULATED 12/10/2024   LDLCALC NOT CALCULATED 11/10/2022    Therapeutic Lab Levels: No results found for: LITHIUM No results found for: VALPROATE No results found for: CBMZ  Physical Findings   AIMS    Flowsheet Row [facility]) [facility] ([facility]) [facility] Ad[facility] (Discharged) [facility]  AIMS Total Score 0 0 0   AUDIT    Flowsheet Row Ad[facility]) [facility] Admission (Discharged) from [facility] Admission (Discharged) [facility] Admission (Discharged) from [facility] Ad[facility] (Discharged) from [facility]  Alcohol  Use Disorder Identification Test Final Score (AUDIT) 40 34 35 26 23   GAD-7    Flowsheet Row Office Visit from 04/15/2024 in [facility] Office Visit from 03/28/2024 in [facility] [place] - A Dept Of [place] Office Visit from 02/15/2024 in [facility] [place] - A Dept Of  [place]. [facility] Office Visit from 05/10/2023 in [facility] [place] - A Dept Of [place] [facility]. [facility] Office Visit from 02/21/2023 in [facility] - A Dept Of [place] [facility]. [facility]  Total GAD-7 Score 11 10 11 14 11    PHQ2-9    Flowsheet Row ED from 12/10/2024 in [facility] Office Visit from 04/15/2024 in [facility] Office Visit from 03/28/2024 in [facility] [place] - A Dept Of [place] Office Visit from 02/15/2024 in [facility] [place] - A Dept Of [place] [facility]. [facility] Office Visit from 05/10/2023 in [facility] [place] - A Dept Of [place] [facility]. [facility]  PHQ-2 Total Score 0 1 2 3 6   PHQ-9 Total Score 2 -- 7 7 20    Flowsheet Row ED from 12/10/2024 in [facility] ED from 12/09/2024 in [facility] Office Visit from 04/15/2024 in [facility]  C-SSRS RISK CATEGORY No Risk No Risk No Risk     Musculoskeletal  Strength & Muscle Tone: within normal limits Gait & Station: normal Patient leans: N/A  Psychiatric Specialty Exam  Presentation  General Appearance:  Appropriate for Environment; Fairly Groomed  Eye Contact: Good  Speech: Clear and Coherent  Speech Volume: Normal  Handedness: Right   Mood and Affect  Mood: Anxious  Affect: Appropriate; Congruent   Thought Process  Thought Processes: Coherent; Linear  Descriptions of Associations:Intact  Orientation:Full (Time, Place and Person)  Thought Content:Logical; WDL  Diagnosis of Schizophrenia or Schizoaffective disorder in past: No    Hallucinations:Hallucinations: None  Ideas of Reference:None  Suicidal Thoughts:Suicidal Thoughts: No  Homicidal Thoughts:Homicidal Thoughts: No   Sensorium  Memory: Immediate  Good; Recent  Good  Judgment: Good  Insight: Good   Executive Functions  Concentration: Good  Attention Span: Good  Recall: Good  Fund of Knowledge: Good  Language: Good   Psychomotor Activity  Psychomotor Activity: Psychomotor Activity: Normal   Assets  Assets: Communication Skills; Desire for Improvement; Resilience; Housing; Physical Health   Sleep  Sleep: Sleep: Good  Estimated Sleeping Duration (Last 24 Hours): 9.25-10.50 hours  No data recorded  Physical Exam  Physical Exam Vitals and nursing note reviewed.  Constitutional:      Appearance: Normal appearance.  HENT:     Head: Normocephalic.     Nose: Nose normal.  Cardiovascular:     Rate and Rhythm: Normal rate.  Pulmonary:     Effort: Pulmonary effort is normal.  Musculoskeletal:        General: Normal range of motion.     Cervical back: Normal range of motion.  Neurological:     Mental Status: He is alert and oriented to person, place, and time.  Psychiatric:        Attention and Perception: Attention normal. He does not perceive auditory or visual hallucinations.        Mood and Affect: Affect normal. Mood is anxious.        Speech: Speech normal.        Behavior: Behavior normal. Behavior is cooperative.        Thought Content: Thought content normal. Thought content is not paranoid or delusional. Thought content does not include homicidal or suicidal ideation. Thought content does not include homicidal or suicidal plan.        Cognition and Memory: Cognition normal.        Judgment: Judgment normal.    ROS Blood pressure 126/86, pulse 82, temperature 97.6 F (36.4 C), temperature source Oral, resp. rate 18, SpO2 96%. There is no height or weight on file to calculate BMI.  Treatment Plan Summary: Daily contact with patient to assess and evaluate symptoms and progress in treatment and Medication management Bleeding precautions due to low platelet count, which is trending upwards. Platelet  count was 84 on 12/10/2024 and most recently on 12/12/2024 was 99.    Medication Management:  -Continue agitation protocol (see MAR) -Continue Maalox/Mylanta 30 mL p.o. every 4 hours as needed indigestion -Continue Ensure high-protein p.o. twice daily between meals -Increased gabapentin  from 200 mg p.o. 3 times daily to 300 mg tid for neuropathy, GAD, and alcohol  dependence/maintenance of abstinence -Continue hydroxyzine  10 mg p.o. 3 times daily as needed anxiety due to previous syncope episode and daytime drowsiness after taking a dose last night -Continue milk of magnesia 15 mL p.o. daily as needed mild constipation -Continue Multivitamin 1 tablet p.o. daily for supplementation -Continue Zoloft  50 mg p.o. daily for MDD/GAD -Continue thiamine  100 mg p.o. daily -Continue melatonin 5 mg by mouth at bedtime for sleep -Continue naltrexone  25 mg by mouth daily for alcohol  dependence/cravings   Disposition: Tentative discharge date of Monday, January 19th to home. He has been accepted to [facility] for January 28th and has an appointment at [facility] that day. His Aunt will help with transporting him to [facility]. He will need a taxi voucher from nursing to get home on Monday. 7 day supply of medications requested from pharmacy today in preparation for his discharge tomorrow.   Discharge Planning: Social work and case management to assist with discharge planning and identification of hospital follow-up needs prior to discharge Estimated LOS: 3-5 days Discharge Concerns: Need to  establish a safety plan; Medication compliance and effectiveness Discharge Goals: Return home with outpatient referrals for mental health follow-up including medication management/psychotherapy   Recommendations  Continue admission to the [facility] and will wok with CSW to gain access to substance abuse rehab. Discharge date will be determined based on progress.   I certify that inpatient services furnished can reasonably be expected to improve  the patient's condition.     Tiah Heckel, NP 12/14/2024 8:45 AM

## 2024-12-14 NOTE — Group Note (Signed)
 Group Topic: Positive Affirmations  Group Date: 12/14/2024 Start Time: 1100 End Time: 1130 Facilitators: Lonzell Dwayne RAMAN, NT  Department: [facility]  Number of Participants: 4  Group Focus: chemical dependency issues Treatment Modality:  Behavior Modification Therapy Interventions utilized were patient education Purpose: enhance coping skills  Name: [name] [name] Date of Birth: 1961/05/22  MR: [id number]    Level of Participation: Patient attended group Quality of Participation: supportive Interactions with others: gave feedback Mood/Affect: positive Triggers (if applicable): N/A Cognition: coherent/clear Progress: Moderate Response: Appropriate Plan: follow-up needed  Patients Problems:  Patient Active Problem List   Diagnosis Date Noted   Malnutrition of moderate degree 11/04/2023   Acute stress disorder 11/04/2023   Status post surgery 10/08/2023   Alcohol  use disorder, severe, dependence (HCC) 11/11/2022   Alcohol  use disorder, severe, in early remission (HCC) 11/10/2022   Alcoholic peripheral neuropathy 11/10/2022   Alcohol  abuse with intoxication 03/27/2022   Neuropathy 12/30/2021   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) 12/27/2021   Uncomplicated alcohol  dependence (HCC) 06/16/2021   Essential hypertension 04/07/2021   Prediabetes 04/07/2021   Other hyperlipidemia 04/07/2021   Major depressive disorder, severe (HCC) 03/16/2021   History of hyperlipidemia 12/21/2020   Seborrheic dermatitis of scalp 12/21/2020   Substance abuse (HCC) 12/21/2020   Hyperopia of both eyes with astigmatism and presbyopia 12/20/2020   Posterior vitreous detachment of right eye 12/20/2020   MDD (major depressive disorder), recurrent episode, severe (HCC) 06/30/2017   Lateral epicondylitis of right elbow 01/05/2017   Strain, MCP, hand, right, initial encounter 01/05/2017   Vasomotor rhinitis 02/13/2016   Bilateral chronic knee pain  01/11/2016   Chronic pain of right wrist 07/07/2015   Carpal tunnel syndrome of right wrist 05/20/2015   Dislocation of right shoulder joint 05/20/2015   Tear of medial meniscus of right knee 03/24/2015   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HCC) 01/27/2015   Chronic cough 01/15/2015   Onychomycosis of toenail 12/15/2014   Dermatitis of face 11/11/2014   GERD (gastroesophageal reflux disease)    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

## 2024-12-14 NOTE — ED Notes (Signed)
 Patient watching TV. Calm, coperative, no physical complaints at this time. Patient in no apparent acute distress. No inappropriate behaviors observed or reported at this time. DENIES SI, HI AND AVH. Environment secured. Safety checks in place per facility protocol. Meds administered per providers orders.

## 2024-12-14 NOTE — ED Notes (Signed)
 Pt is maintained on CIWA: score is 2,  pt experiencing some anxiety. Pt is compliant with medications and verbalizes with understanding.Pt is able to discuss realistic goals post discharge making multiple reference to rehab.  The patient Stated, I want to get better then give back by helping others like myself.  The patient offers good eye contact, although affect remains flat. Safety maintained.

## 2024-12-14 NOTE — ED Notes (Signed)
 Patient is in the bedroom calm and sleeping, NAD. Will monitor for safety.

## 2024-12-14 NOTE — ED Notes (Signed)
 Patient denies pain and is resting comfortably.

## 2024-12-14 NOTE — Group Note (Signed)
 Group Topic: Identity and Relationships  Group Date: 12/14/2024 Start Time: 2030 End Time: 2115 Facilitators: Nathanael Moulder, NT  Department: [facility]  Number of Participants: 7  Group Focus: activities of daily living skills, check in, community group, leisure skills, relaxation, and social skills Treatment Modality:  Psychoeducation Interventions utilized were group exercise, leisure development, and support Purpose: enhance coping skills, express feelings, improve communication skills, increase insight, and regain self-worth  Name: [name] [name] Date of Birth: 06-Aug-1961  MR: [id number]    Level of Participation: active Quality of Participation: cooperative, engaged, and initiates communication Interactions with others: gave feedback Mood/Affect: appropriate Triggers (if applicable): N/A Cognition: coherent/clear Progress: Gaining insight Response: Appropriate Plan: patient will be encouraged to attend future groups  Patients Problems:  Patient Active Problem List   Diagnosis Date Noted   Malnutrition of moderate degree 11/04/2023   Acute stress disorder 11/04/2023   Status post surgery 10/08/2023   Alcohol  use disorder, severe, dependence (HCC) 11/11/2022   Alcohol  use disorder, severe, in early remission (HCC) 11/10/2022   Alcoholic peripheral neuropathy 11/10/2022   Alcohol  abuse with intoxication 03/27/2022   Neuropathy 12/30/2021   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) 12/27/2021   Uncomplicated alcohol  dependence (HCC) 06/16/2021   Essential hypertension 04/07/2021   Prediabetes 04/07/2021   Other hyperlipidemia 04/07/2021   Major depressive disorder, severe (HCC) 03/16/2021   History of hyperlipidemia 12/21/2020   Seborrheic dermatitis of scalp 12/21/2020   Substance abuse (HCC) 12/21/2020   Hyperopia of both eyes with astigmatism and presbyopia 12/20/2020   Posterior vitreous detachment of right eye 12/20/2020    MDD (major depressive disorder), recurrent episode, severe (HCC) 06/30/2017   Lateral epicondylitis of right elbow 01/05/2017   Strain, MCP, hand, right, initial encounter 01/05/2017   Vasomotor rhinitis 02/13/2016   Bilateral chronic knee pain 01/11/2016   Chronic pain of right wrist 07/07/2015   Carpal tunnel syndrome of right wrist 05/20/2015   Dislocation of right shoulder joint 05/20/2015   Tear of medial meniscus of right knee 03/24/2015   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HCC) 01/27/2015   Chronic cough 01/15/2015   Onychomycosis of toenail 12/15/2014   Dermatitis of face 11/11/2014   GERD (gastroesophageal reflux disease)    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

## 2024-12-15 ENCOUNTER — Ambulatory Visit (HOSPITAL_COMMUNITY): Payer: MEDICAID

## 2024-12-15 DIAGNOSIS — Z79899 Other long term (current) drug therapy: Secondary | ICD-10-CM | POA: Diagnosis not present

## 2024-12-15 DIAGNOSIS — R748 Abnormal levels of other serum enzymes: Secondary | ICD-10-CM | POA: Diagnosis not present

## 2024-12-15 DIAGNOSIS — F1024 Alcohol dependence with alcohol-induced mood disorder: Secondary | ICD-10-CM | POA: Diagnosis not present

## 2024-12-15 DIAGNOSIS — F411 Generalized anxiety disorder: Secondary | ICD-10-CM | POA: Diagnosis not present

## 2024-12-15 NOTE — ED Notes (Signed)
 Pt reports feeling 'good', however became anxious when requested to fill out safety plan for discharge planning. Pt said he does not write well, staff assisted pt in writing his answers. Pt denies si hi and avh, verbal contract for safety provided. Pt ate breakfast, denied physical pain and discomforts.

## 2024-12-15 NOTE — Care Management (Addendum)
 [facility] Care Management...  Addendum 11:40 am  Writer spoke patient's sister [name] [name], and advised of intake date of 12/17/24 @ 10:30 am  Writer provided [name] [name] with address and phone number for [facility]     Addendum 11:00 am  Per [name] at [place],  Rescheduling patient intake appointment to Wednesday 12/17/24 @ 10:30 am  Writer will inform patient  Writer will reached out patient's sister [name] [name] and advise of Water Engineer met with patient to discuss discharge planning  Writer confirmed patient discharging today 12/15/2024 to home  Patient has appointment scheduled for intake with [facility] on 12/24/2024 @ 9 am  Writer spoke with patient sister [name] [name] [phone] to advise of patients discharge plan.  [name] [name] asked if patient could be kept until his intake date. [name] [name] reported that patient has been going through this for years and patient doesn't feel that he has a drinking problem. [name] [name] reported that patient has everything due to drinking.    Writer informed [name] [name] that there would be no medical reason to extend patients date.

## 2024-12-15 NOTE — ED Notes (Signed)
 Discharge paperwork discussed with pt, including medications and AVS, pt expressed understanding after further RN explanation.

## 2024-12-15 NOTE — ED Notes (Signed)
 Pt escorted off unit by staff for discharge, items returned.

## 2024-12-15 NOTE — ED Provider Notes (Signed)
 [facility]BS ASAP Discharge Summary  Date and Time: 12/15/2024 10:15 AM  Name: [name] [name]  MRN:  [id number]   Discharge Diagnoses:  Final diagnoses:  Alcohol  abuse with alcohol -induced mood disorder (HCC)  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)  Alcohol  use disorder, severe, dependence (HCC)  GAD (generalized anxiety disorder)  Elevated liver enzymes    Subjective: Patient states I have never stuck with AA and I am know that I need to stick with AA, that is my main focus, aftercare.  I will stay focused on his journey because I have to.  I want to make sure this time is the last time that I need to do treatment.  Chart reviewed and patient discussed with attending psychiatrist, Dr. Cole, on 12/15/2024.  [name] [name] was admitted for alcohol  abuse with alcohol -induced mood disorder and crisis management.   He was discharged with current medication and was instructed on how to take medications as prescribed; (details listed below under Medication List).  Medical problems were identified and treated as needed.  Home medications were restarted as appropriate.  Improvement was monitored by observation and patient's  daily report of symptom reduction.  Emotional and mental status was monitored by daily provider assessment and clinical staff.         [name] [name] was evaluated by the treatment team for stability and plans for continued recovery upon discharge.  His motivation was an integral factor for scheduling further treatment.  Employment, transportation, bed availability, health status, family support, and any pending legal issues were also considered during his hospital stay.  He was offered further treatment options upon discharge including but not limited to Residential, Intensive Outpatient, and Outpatient treatment.  [name] [name] will follow up with [facility] upon discharge.       Upon completion of this admission [name] [name] was both mentally and medically stable for discharge denying  suicidal/homicidal ideation, symptoms that would be consistent with psychosis (AVH, IOR, paranoia, etc).   On my interview today, day of discharge, , patient is in NAD, alert, oriented, calm, cooperative, and attentive, with normal affect, speech, and behavior. Objectively, there is no evidence of psychosis/ mania (able to converse coherently, linear and goal directed thought, no RIS, no distractibility, not pre-occupied, no FOI, etc) nor depression to the point of suicidality (able to concentrate, affect full and reactive, speech normal r/v/t, no psychomotor retardation/agitation, etc).  Overall, patient appears to be at the point, in the absence of inhibiting or disinhibiting symptoms, where he can successfully move to lesser restrictive setting for care.  Patient offered support and encouragement.   Patient educated and verbalizes understanding of mental health resources and other crisis services in the community. They are instructed to call 911 and present to the nearest emergency room should patient experience any suicidal/homicidal ideation, auditory/visual/hallucinations, or detrimental worsening of mental health condition.       Stay Summary: 12/10/2024:0036am- Thurman Ivans NP: HPI: [name] [name] is a 64 year old male with psychiatric history of alcohol  abuse, anxiety, depression and medical history of hypertension, who presented voluntarily as a walk-in to [facility] seeking detox from alcohol .   Patient was seen face-to-face by this provider and chart reviewed.   Patient reports I came in for alcohol  addiction, and I have been using off/on since I was around 64 years old, but I stopped using for a while and now I'm using daily due to my anxiety, I used to take anxiety medication but I've not taken it because it does not go together with  drinking. I'm just trying to get some help.   Patient endorses daily drinking for a few months now and states I only drink beer, the malt liquor type,  usually about a quart to 3 quarts and I noticed when I'm not drinking, I get the tremors/shakes and I last drank a 40 ounce right before I came in.  Patient denies history of alcohol  withdrawal seizures.   Patient denies other substance use.  Patient reports he lives alone.  He denies access to gun or weapon.   Patient endorses prior alcohol  rehab experience notably at [facility] a couple of times, and other places he cannot remember the names.   Patient reports he is unable to remember the names of his anxiety medication or blood pressure medication and last took his BP meds more than 3 months ago.   Patient completed the PHQ-9 questionnaire and obtained a total score of 24, indicating severe depression.  Will restart patient his antidepressant  and blood pressure medications.   Patient is provided with opportunity for questions.  He verbalized understanding and is in agreement.   On evaluation, patient is alert, oriented x 3, and cooperative. Speech is clear and coherent. Pt appears casually dressed. Eye contact is fair. Mood is anxious and depressed, affect is congruent with mood. Thought process is coherent and thought content is WDL. Pt denies SI/HI/AVH. There is no objective indication that the patient is responding to internal stimuli. No delusions elicited during this assessment.    Total Time spent with patient: 30 minutes  Past Psychiatric History: Alcohol  use disorder, alcohol -induced mood disorder with depressive symptoms, GAD, MDD, acute stress disorder Past Medical History: HTN, CVA, support dermatitis, neuropathy, history of pancreatitis, GERD, bilateral primary osteoarthritis of knee, history of alcoholic hepatitis Family History: None reported Family Psychiatric History: Patient reported Social History: Patient resides alone in [place].  He denies access to weapons.  He is retired. Tobacco Cessation:  N/A, patient does not currently use tobacco products  Current  Medications:  Current Facility-Administered Medications  Medication Dose Route Frequency Provider Last Rate Last Admin   alum & mag hydroxide-simeth (MAALOX/MYLANTA) 200-200-20 MG/5ML suspension 30 mL  30 mL Oral Q4H PRN Onuoha, Chinwendu V, NP       feeding supplement (ENSURE PLUS HIGH PROTEIN) liquid 237 mL  237 mL Oral BID BM Ramzan, Mariam, NP   237 mL at 12/15/24 0932   gabapentin  (NEURONTIN ) capsule 300 mg  300 mg Oral TID Ramzan, Mariam, NP   300 mg at 12/15/24 0932   hydrOXYzine  (ATARAX ) tablet 10 mg  10 mg Oral TID PRN Ramzan, Mariam, NP   10 mg at 12/14/24 2207   magnesium  hydroxide (MILK OF MAGNESIA) suspension 15 mL  15 mL Oral Daily PRN Onuoha, Chinwendu V, NP       melatonin tablet 5 mg  5 mg Oral QHS Nkwenti, Doris, NP   5 mg at 12/14/24 2207   multivitamin with minerals tablet 1 tablet  1 tablet Oral Daily Onuoha, Chinwendu V, NP   1 tablet at 12/15/24 0932   naltrexone  (DEPADE) tablet 25 mg  25 mg Oral Daily Ramzan, Mariam, NP   25 mg at 12/15/24 0930   OLANZapine  zydis (ZYPREXA ) disintegrating tablet 5 mg  5 mg Oral TID PRN Ramzan, Mariam, NP       sertraline  (ZOLOFT ) tablet 50 mg  50 mg Oral Daily Nkwenti, Doris, NP   50 mg at 12/15/24 0930   thiamine  (VITAMIN B1) tablet 100 mg  100  mg Oral Daily Ramzan, Mariam, NP   100 mg at 12/15/24 0930   Current Outpatient Medications  Medication Sig Dispense Refill   gabapentin  (NEURONTIN ) 300 MG capsule Take 300 mg by mouth 3 (three) times daily.     melatonin 5 MG TABS Take 1 tablet (5 mg total) by mouth at bedtime. 30 tablet 0   Multiple Vitamin (MULTIVITAMIN WITH MINERALS) TABS tablet Take 1 tablet by mouth daily. 30 tablet 0   naltrexone  (DEPADE) 50 MG tablet Take 0.5 tablets (25 mg total) by mouth daily. 30 tablet 0   sertraline  (ZOLOFT ) 50 MG tablet Take 1 tablet (50 mg total) by mouth daily. 30 tablet 0    PTA Medications:  Facility Ordered Medications  Medication   alum & mag hydroxide-simeth (MAALOX/MYLANTA) 200-200-20  MG/5ML suspension 30 mL   [COMPLETED] thiamine  (VITAMIN B1) injection 100 mg   multivitamin with minerals tablet 1 tablet   [COMPLETED] chlordiazePOXIDE  (LIBRIUM ) capsule 25 mg   magnesium  hydroxide (MILK OF MAGNESIA) suspension 15 mL   [COMPLETED] metoprolol  tartrate (LOPRESSOR ) tablet 25 mg   [COMPLETED] ondansetron  (ZOFRAN ) injection 4 mg   sertraline  (ZOLOFT ) tablet 50 mg   melatonin tablet 5 mg   feeding supplement (ENSURE PLUS HIGH PROTEIN) liquid 237 mL   thiamine  (VITAMIN B1) tablet 100 mg   OLANZapine  zydis (ZYPREXA ) disintegrating tablet 5 mg   hydrOXYzine  (ATARAX ) tablet 10 mg   naltrexone  (DEPADE) tablet 25 mg   gabapentin  (NEURONTIN ) capsule 300 mg   PTA Medications  Medication Sig   sertraline  (ZOLOFT ) 50 MG tablet Take 1 tablet (50 mg total) by mouth daily.   melatonin 5 MG TABS Take 1 tablet (5 mg total) by mouth at bedtime.   naltrexone  (DEPADE) 50 MG tablet Take 0.5 tablets (25 mg total) by mouth daily.   Multiple Vitamin (MULTIVITAMIN WITH MINERALS) TABS tablet Take 1 tablet by mouth daily.       12/13/2024   12:22 PM 12/12/2024    4:48 PM 12/10/2024   12:44 AM  Depression screen PHQ 2/9  Decreased Interest 0 1 3  Down, Depressed, Hopeless 0 1 3  PHQ - 2 Score 0 2 6  Altered sleeping 2  3  Tired, decreased energy 0  3  Change in appetite 0  3  Feeling bad or failure about yourself  0  3  Trouble concentrating 0  3  Moving slowly or fidgety/restless 0  3  Suicidal thoughts 0  0  PHQ-9 Score 2  24  Difficult doing work/chores Somewhat difficult  Somewhat difficult    Flowsheet Row ED from 12/10/2024 in [facility] ED from 12/09/2024 in [facility] Office Visit from 04/15/2024 in [facility]  C-SSRS RISK CATEGORY No Risk No Risk No Risk    Musculoskeletal  Strength & Muscle Tone: within normal limits Gait & Station: normal Patient leans: N/A  Psychiatric Specialty Exam   Presentation  General Appearance:  Appropriate for Environment; Casual  Eye Contact: Good  Speech: Clear and Coherent; Normal Rate  Speech Volume: Normal  Handedness: Right   Mood and Affect  Mood: Euthymic  Affect: Appropriate; Congruent   Thought Process  Thought Processes: Coherent; Goal Directed; Linear  Descriptions of Associations:Intact  Orientation:Full (Time, Place and Person)  Thought Content:Logical; WDL  Diagnosis of Schizophrenia or Schizoaffective disorder in past: No    Hallucinations:Hallucinations: None  Ideas of Reference:None  Suicidal Thoughts:Suicidal Thoughts: No  Homicidal Thoughts:Homicidal Thoughts: No  Sensorium  Memory: Immediate Good; Recent Fair  Judgment: Good  Insight: Good   Executive Functions  Concentration: Good  Attention Span: Good  Recall: Good  Fund of Knowledge: Good  Language: Good   Psychomotor Activity  Psychomotor Activity: Psychomotor Activity: Normal   Assets  Assets: Housing; Health And Safety Inspector; Desire for Improvement; Communication Skills; Resilience; Social Support   Sleep  Sleep: Sleep: Good  Estimated Sleeping Duration (Last 24 Hours): 9.00 hours  No data recorded  Physical Exam  Physical Exam Vitals and nursing note reviewed.  Constitutional:      Appearance: Normal appearance. He is well-developed.  HENT:     Head: Normocephalic and atraumatic.     Nose: Nose normal.  Cardiovascular:     Rate and Rhythm: Normal rate.  Pulmonary:     Effort: Pulmonary effort is normal.  Musculoskeletal:        General: Normal range of motion.     Cervical back: Normal range of motion.  Skin:    General: Skin is warm and dry.  Neurological:     Mental Status: He is alert and oriented to person, place, and time.  Psychiatric:        Attention and Perception: Attention and perception normal.        Mood and Affect: Mood and affect normal.        Speech:  Speech normal.        Behavior: Behavior normal. Behavior is cooperative.        Thought Content: Thought content normal.        Cognition and Memory: Cognition and memory normal.    Review of Systems  Constitutional: Negative.   HENT: Negative.    Eyes: Negative.   Respiratory: Negative.    Cardiovascular: Negative.   Gastrointestinal: Negative.   Genitourinary: Negative.   Musculoskeletal: Negative.   Skin: Negative.   Neurological: Negative.   Psychiatric/Behavioral:  Positive for substance abuse.    Blood pressure (!) 133/96, pulse 100, temperature 97.8 F (36.6 C), temperature source Oral, resp. rate 20, SpO2 99%. There is no height or weight on file to calculate BMI.  Demographic Factors:  Male and Unemployed  Loss Factors: NA  Historical Factors: NA  Risk Reduction Factors:   Positive social support, Positive therapeutic relationship, and Positive coping skills or problem solving skills  Continued Clinical Symptoms:  Alcohol /Substance Abuse/Dependencies More than one psychiatric diagnosis  Cognitive Features That Contribute To Risk:  None    Suicide Risk:  Minimal: No identifiable suicidal ideation.  Patients presenting with no risk factors but with morbid ruminations; may be classified as minimal risk based on the severity of the depressive symptoms  Plan Of Care/Follow-up recommendations:  -Follow-up with outpatient primary care doctor and other specialists -for management of preventative medicine and chronic medical disease.  Medication list: -Gabapentin  300 mg 3 times daily -Melatonin 5 mg nightly/sleep -Multivitamin mineral 1 tab daily/nutritional supplementation -Naltrexone  25 mg daily/alcohol  use disorder -Sertraline  50 mg daily/mood  Patient discharged to residential substance use treatment, excepted to [facility], for arrival later this date.  -Recommend abstinence from alcohol , tobacco, and other illicit drug use at discharge.    -If your  psychiatric symptoms recur, worsen, or if you have side effects to your psychiatric medications, call your outpatient psychiatric provider, [phone] or go to the nearest emergency department.   -If suicidal thoughts recur, call your outpatient psychiatric provider, 911, [phone] or go to the nearest emergency department.    Disposition: Discharge to [facility].  Note: This document was prepared using Dragon voice recognition software and may include unintentional dictation errors.   Ellouise LITTIE Dawn, FNP 12/15/2024, 10:15 AM

## 2024-12-22 ENCOUNTER — Ambulatory Visit: Payer: MEDICAID | Admitting: Orthopaedic Surgery

## 2024-12-24 ENCOUNTER — Ambulatory Visit: Payer: MEDICAID | Admitting: Physician Assistant

## 2024-12-24 ENCOUNTER — Other Ambulatory Visit: Payer: Self-pay

## 2024-12-24 MED ORDER — NALTREXONE HCL 50 MG PO TABS
25.0000 mg | ORAL_TABLET | Freq: Every day | ORAL | 0 refills | Status: AC
  Filled 2024-12-24: qty 15, 30d supply, fill #0

## 2024-12-24 MED ORDER — ONE-DAILY MULTI-VITAMIN PO TABS
1.0000 | ORAL_TABLET | Freq: Every day | ORAL | 0 refills | Status: AC
  Filled 2024-12-24: qty 30, 30d supply, fill #0

## 2024-12-24 MED ORDER — MELATONIN 5 MG PO TABS
5.0000 mg | ORAL_TABLET | Freq: Every day | ORAL | 0 refills | Status: AC
  Filled 2024-12-24: qty 30, 30d supply, fill #0

## 2024-12-24 MED ORDER — SERTRALINE HCL 50 MG PO TABS
50.0000 mg | ORAL_TABLET | Freq: Every day | ORAL | 0 refills | Status: AC
  Filled 2024-12-24: qty 30, 30d supply, fill #0

## 2025-01-01 ENCOUNTER — Ambulatory Visit: Payer: MEDICAID | Admitting: Physician Assistant

## 2025-01-15 ENCOUNTER — Ambulatory Visit: Payer: MEDICAID | Admitting: Physician Assistant
